# Patient Record
Sex: Male | Born: 1960 | Race: Black or African American | Hispanic: No | Marital: Married | Smoking: Former smoker
Health system: Southern US, Community
[De-identification: ages and names within clinical notes are randomized; demographics above are authoritative.]

## PROBLEM LIST (undated history)

## (undated) DIAGNOSIS — E119 Type 2 diabetes mellitus without complications: Secondary | ICD-10-CM

## (undated) DIAGNOSIS — N186 End stage renal disease: Secondary | ICD-10-CM

## (undated) DIAGNOSIS — I10 Essential (primary) hypertension: Secondary | ICD-10-CM

## (undated) DIAGNOSIS — I739 Peripheral vascular disease, unspecified: Secondary | ICD-10-CM

## (undated) DIAGNOSIS — E785 Hyperlipidemia, unspecified: Secondary | ICD-10-CM

## (undated) DIAGNOSIS — M869 Osteomyelitis, unspecified: Secondary | ICD-10-CM

## (undated) DIAGNOSIS — G629 Polyneuropathy, unspecified: Secondary | ICD-10-CM

## (undated) DIAGNOSIS — D649 Anemia, unspecified: Secondary | ICD-10-CM

## (undated) DIAGNOSIS — W3400XA Accidental discharge from unspecified firearms or gun, initial encounter: Secondary | ICD-10-CM

## (undated) DIAGNOSIS — K219 Gastro-esophageal reflux disease without esophagitis: Secondary | ICD-10-CM

## (undated) DIAGNOSIS — E1140 Type 2 diabetes mellitus with diabetic neuropathy, unspecified: Secondary | ICD-10-CM

## (undated) DIAGNOSIS — Z992 Dependence on renal dialysis: Secondary | ICD-10-CM

## (undated) DIAGNOSIS — IMO0001 Reserved for inherently not codable concepts without codable children: Secondary | ICD-10-CM

## (undated) DIAGNOSIS — Z794 Long term (current) use of insulin: Secondary | ICD-10-CM

## (undated) DIAGNOSIS — N184 Chronic kidney disease, stage 4 (severe): Secondary | ICD-10-CM

## (undated) DIAGNOSIS — K759 Inflammatory liver disease, unspecified: Secondary | ICD-10-CM

## (undated) DIAGNOSIS — Z972 Presence of dental prosthetic device (complete) (partial): Secondary | ICD-10-CM

## (undated) HISTORY — PX: EYE SURGERY: SHX253

## (undated) HISTORY — PX: FOOT OSTEOTOMY: SHX957

## (undated) HISTORY — PX: COLONOSCOPY: SHX174

## (undated) HISTORY — PX: AMPUTATION FINGER / THUMB: SUR24

## (undated) HISTORY — PX: BELOW KNEE LEG AMPUTATION: SUR23

---
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
## 2011-10-29 ENCOUNTER — Encounter: Payer: Self-pay | Admitting: *Deleted

## 2011-10-29 ENCOUNTER — Emergency Department (HOSPITAL_COMMUNITY): Payer: Self-pay

## 2011-10-29 ENCOUNTER — Emergency Department (HOSPITAL_COMMUNITY)
Admission: EM | Admit: 2011-10-29 | Discharge: 2011-10-29 | Disposition: A | Discharge status: Home / Self Care | Payer: Self-pay | Attending: Emergency Medicine | Admitting: Emergency Medicine

## 2011-10-29 DIAGNOSIS — Z794 Long term (current) use of insulin: Secondary | ICD-10-CM | POA: Insufficient documentation

## 2011-10-29 DIAGNOSIS — J029 Acute pharyngitis, unspecified: Secondary | ICD-10-CM | POA: Insufficient documentation

## 2011-10-29 DIAGNOSIS — E119 Type 2 diabetes mellitus without complications: Secondary | ICD-10-CM | POA: Insufficient documentation

## 2011-10-29 DIAGNOSIS — K219 Gastro-esophageal reflux disease without esophagitis: Secondary | ICD-10-CM | POA: Insufficient documentation

## 2011-10-29 DIAGNOSIS — J3489 Other specified disorders of nose and nasal sinuses: Secondary | ICD-10-CM | POA: Insufficient documentation

## 2011-10-29 DIAGNOSIS — M545 Low back pain, unspecified: Secondary | ICD-10-CM | POA: Insufficient documentation

## 2011-10-29 DIAGNOSIS — J069 Acute upper respiratory infection, unspecified: Secondary | ICD-10-CM | POA: Insufficient documentation

## 2011-10-29 DIAGNOSIS — J329 Chronic sinusitis, unspecified: Secondary | ICD-10-CM | POA: Insufficient documentation

## 2011-10-29 DIAGNOSIS — I10 Essential (primary) hypertension: Secondary | ICD-10-CM | POA: Insufficient documentation

## 2011-10-29 DIAGNOSIS — J40 Bronchitis, not specified as acute or chronic: Secondary | ICD-10-CM | POA: Insufficient documentation

## 2011-10-29 DIAGNOSIS — R197 Diarrhea, unspecified: Secondary | ICD-10-CM | POA: Insufficient documentation

## 2011-10-29 DIAGNOSIS — R059 Cough, unspecified: Secondary | ICD-10-CM | POA: Insufficient documentation

## 2011-10-29 DIAGNOSIS — R05 Cough: Secondary | ICD-10-CM | POA: Insufficient documentation

## 2011-10-29 HISTORY — DX: Essential (primary) hypertension: I10

## 2011-10-29 HISTORY — DX: Gastro-esophageal reflux disease without esophagitis: K21.9

## 2011-10-29 MED ORDER — HYDROCOD POLST-CHLORPHEN POLST 10-8 MG/5ML PO LQCR: 5.0000 mL | Freq: Two times a day (BID) | ORAL | Status: DC | PRN

## 2011-10-29 MED ORDER — ALBUTEROL SULFATE HFA 108 (90 BASE) MCG/ACT IN AERS
2.0000 | INHALATION_SPRAY | RESPIRATORY_TRACT | Status: DC
  Administered 2011-10-29: 2 via RESPIRATORY_TRACT
  Filled 2011-10-29: qty 6.7

## 2011-10-29 MED ORDER — PSEUDOEPHEDRINE HCL 30 MG PO TABS: 30.0000 mg | ORAL_TABLET | ORAL | Status: AC | PRN

## 2011-10-29 MED ORDER — AEROCHAMBER PLUS W/MASK MISC
Status: AC
  Administered 2011-10-29: 09:00:00
  Filled 2011-10-29: qty 1

## 2011-10-29 MED ORDER — HYDROCOD POLST-CHLORPHEN POLST 10-8 MG/5ML PO LQCR
5.0000 mL | Freq: Once | ORAL | Status: AC
  Administered 2011-10-29: 5 mL via ORAL
  Filled 2011-10-29: qty 5

## 2011-10-29 NOTE — ED Provider Notes (Signed)
History     CSN: [id number] Arrival date & time: 10/29/2011  8:28 AM   First MD Initiated Contact with Patient 10/29/11 0848      Chief Complaint  Patient presents with  . Nasal Congestion  . URI  . Back Pain  . Diarrhea    (Consider location/radiation/quality/duration/timing/severity/associated sxs/prior treatment) Patient is a 50 y.o. male presenting with URI. The history is provided by the patient.  URI The primary symptoms include sore throat and cough. Primary symptoms do not include fever, ear pain, swollen glands, wheezing or abdominal pain. The current episode started 2 days ago. This is a new problem. The problem has been gradually worsening.  The sore throat began 2 days ago. The sore throat has been unchanged since its onset. The sore throat is mild in intensity. The sore throat is not accompanied by trouble swallowing, drooling or hoarse voice.  The cough began 2 days ago. The cough is productive. The sputum is clear.  Symptoms associated with the illness include sinus pressure, congestion and rhinorrhea. The illness is not associated with chills.  Pt states prior to onset of URI, he had some lower back pain, abdominal pain, diarrhea. States all those symptoms now resolved. Pt denies fever, chills, chest pain. Not taking any medications for this.   Past Medical History  Diagnosis Date  . GERD (gastroesophageal reflux disease)   . HTN (hypertension)   . Diabetes mellitus     History reviewed. No pertinent past surgical history.  History reviewed. No pertinent family history.  History  Substance Use Topics  . Smoking status: Unknown If Ever Smoked  . Smokeless tobacco: Not on file  . Alcohol Use: No      Review of Systems  Constitutional: Negative for fever, chills and diaphoresis.  HENT: Positive for congestion, sore throat, rhinorrhea and sinus pressure. Negative for ear pain, hoarse voice, drooling and trouble swallowing.   Eyes: Negative.   Respiratory:  Positive for cough. Negative for chest tightness and wheezing.   Cardiovascular: Negative for chest pain and leg swelling.  Gastrointestinal: Negative for abdominal pain.  Genitourinary: Negative.   Musculoskeletal: Negative.   Skin: Negative.   Neurological: Negative for dizziness, weakness, light-headedness and numbness.  Psychiatric/Behavioral: Negative.     Allergies  Review of patient's allergies indicates no known allergies.  Home Medications   Current Outpatient Rx  Name Route Sig Dispense Refill  . INSULIN ISOPHANE & REGULAR (70-30) 100 UNIT/ML Victor SUSP Subcutaneous Inject 15-20 Units into the skin 2 (two) times daily. 20 units in the morning, 15 units at night     . LISINOPRIL PO Oral Take 1 tablet by mouth daily.      Marland Kitchen METFORMIN HCL PO Oral Take 1 tablet by mouth 2 (two) times daily.        BP 185/90  Pulse 80  Temp(Src) 97.8 F (36.6 C) (Oral)  Resp 18  SpO2 100%  Physical Exam  Constitutional: He is oriented to person, place, and time. He appears well-developed and well-nourished. No distress.  Eyes: Conjunctivae are normal.  Neck: Neck supple.  Cardiovascular: Normal rate, regular rhythm and normal heart sounds.   Pulmonary/Chest: Effort normal. No respiratory distress. He has wheezes. He has no rales. He exhibits no tenderness.  Abdominal: Soft. Bowel sounds are normal. There is no tenderness.  Musculoskeletal: Normal range of motion. He exhibits no edema.  Neurological: He is alert and oriented to person, place, and time.  Skin: Skin is warm and dry. No erythema.  Psychiatric: He has a normal mood and affect.    ED Course  Procedures (including critical care time)  No results found for this or any previous visit. Dg Chest 2 View  10/29/2011  *RADIOLOGY REPORT*  Clinical Data: CP, sinuses running, sometimes SOB, cough - prod in mornings and night, x 1 wk  CHEST - 2 VIEW  Comparison: None.  Findings: Lungs clear.  Heart size and pulmonary vascularity  normal.  No effusion.  Visualized bones unremarkable.  IMPRESSION: No acute disease  Original Report Authenticated By: Trecia Rogers, M.D.    X-ray negative. Pt has mild wheezes on exam. Oxygen sat 100%. Suspect viral bronchitis with nasal congestion. Given albuterol inhaler. Advised strongly to stop smoking. Will d/c home with prednisone cough meds and follow up. Pt denies chest pain, SOB. Pt is hypertensive, otherwise normal vs.    [place], [place] 10/29/11 1645

## 2011-10-29 NOTE — ED Notes (Signed)
Family at bedside. 

## 2011-10-29 NOTE — ED Notes (Signed)
Pt. reports bilateral lower back pain and, abdominal pain, and diarrhea that started 1.5 weeks ago. Pt. Presents today with "nasal and chest congestion."  Pt. Denies n/v, SOB, and fever.  Pt. Also denies any sick contacts.

## 2011-10-31 NOTE — ED Provider Notes (Signed)
Medical screening examination/treatment/procedure(s) were performed by non-physician practitioner and as supervising physician I was immediately available for consultation/collaboration. Rolland Porter, MD, Abram Sander   Janice Norrie, MD 10/31/11 [phone]

## 2012-03-29 ENCOUNTER — Encounter (HOSPITAL_BASED_OUTPATIENT_CLINIC_OR_DEPARTMENT_OTHER): Payer: BC Managed Care – PPO | Attending: General Surgery

## 2012-03-29 DIAGNOSIS — S98139A Complete traumatic amputation of one unspecified lesser toe, initial encounter: Secondary | ICD-10-CM | POA: Insufficient documentation

## 2012-03-29 DIAGNOSIS — Z79899 Other long term (current) drug therapy: Secondary | ICD-10-CM | POA: Insufficient documentation

## 2012-03-29 DIAGNOSIS — E119 Type 2 diabetes mellitus without complications: Secondary | ICD-10-CM | POA: Insufficient documentation

## 2012-03-29 DIAGNOSIS — L97509 Non-pressure chronic ulcer of other part of unspecified foot with unspecified severity: Secondary | ICD-10-CM | POA: Insufficient documentation

## 2012-03-29 DIAGNOSIS — I10 Essential (primary) hypertension: Secondary | ICD-10-CM | POA: Insufficient documentation

## 2012-03-29 DIAGNOSIS — Z794 Long term (current) use of insulin: Secondary | ICD-10-CM | POA: Insufficient documentation

## 2012-03-29 NOTE — Progress Notes (Signed)
[facility]  NAMEHERSON, [name]              ACCOUNT NO.:  [id number]  MEDICAL RECORD NO.:  [id number]      DATE OF BIRTH:  1961/10/24  PHYSICIAN:  Judene Companion, M.D.      VISIT DATE:  03/29/2012                                  OFFICE VISIT   [name] is a very nice gentleman who is 51 years of age.  He comes here with an ulcer on his left fourth toe.  It is about a centimeter in diameter, and he has a blood pressure of 172/85, respirations 16, pulse 80, temperature 98.6.  He is a fairly healthy gentleman who takes good care of his diabetes.  He tells me the last time he had an A1c it was 6.5.  He noticed this about 3 or 4 weeks ago.  The only medicine he takes is NovoLog, and he takes lisinopril for mild hypertension.  While he was here, he told me that he had had his fourth toe on this foot already amputated for diabetic foot ulcer with complications, but that healed well, and he is had no further trouble since, and that was in 2009.  He works every day at [employer] job and while he was here on examination, I could feel excellent pulses, and his foot was nice and warm.  I debrided the callus from the ulcer, which is about a centimeter.  We got a culture and got an x-ray to make sure there was not any osteo, and we put silver collagen in there, and we will make plans for a total contact cast next week, and because it was a new open wound, I empirically started him on doxycycline.     Judene Companion, M.D.     PP/MEDQ  D:  03/29/2012  T:  03/29/2012  Job:  [id number]

## 2012-04-05 ENCOUNTER — Encounter (HOSPITAL_BASED_OUTPATIENT_CLINIC_OR_DEPARTMENT_OTHER): Payer: BC Managed Care – PPO | Attending: General Surgery

## 2012-04-05 DIAGNOSIS — Z794 Long term (current) use of insulin: Secondary | ICD-10-CM | POA: Insufficient documentation

## 2012-04-05 DIAGNOSIS — I10 Essential (primary) hypertension: Secondary | ICD-10-CM | POA: Insufficient documentation

## 2012-04-05 DIAGNOSIS — L97509 Non-pressure chronic ulcer of other part of unspecified foot with unspecified severity: Secondary | ICD-10-CM | POA: Insufficient documentation

## 2012-04-05 DIAGNOSIS — Z79899 Other long term (current) drug therapy: Secondary | ICD-10-CM | POA: Insufficient documentation

## 2012-04-05 DIAGNOSIS — E1169 Type 2 diabetes mellitus with other specified complication: Secondary | ICD-10-CM | POA: Insufficient documentation

## 2012-04-12 ENCOUNTER — Ambulatory Visit (HOSPITAL_COMMUNITY)
Admission: RE | Admit: 2012-04-12 | Discharge: 2012-04-12 | Disposition: A | Discharge status: Home / Self Care | Payer: BC Managed Care – PPO | Source: Ambulatory Visit | Attending: General Surgery | Admitting: General Surgery

## 2012-04-12 ENCOUNTER — Other Ambulatory Visit (HOSPITAL_BASED_OUTPATIENT_CLINIC_OR_DEPARTMENT_OTHER): Payer: Self-pay | Admitting: General Surgery

## 2012-04-12 DIAGNOSIS — Z01818 Encounter for other preprocedural examination: Secondary | ICD-10-CM | POA: Insufficient documentation

## 2012-04-12 LAB — GLUCOSE, CAPILLARY: Glucose-Capillary: 194 mg/dL — ABNORMAL HIGH (ref 70–99)

## 2012-04-12 NOTE — Progress Notes (Signed)
[facility]  NAME:  KAZIMIERZ, RODI              ACCOUNT NO.:  [id number]  MEDICAL RECORD NO.:  [id number]      DATE OF BIRTH:  [birth year]  PHYSICIAN:  Judene Companion, M.D.           VISIT DATE:                                  OFFICE VISIT   [name] is a 51 year old gentleman with longstanding diabetes and he has got an ulcer on his plantar aspect of his left foot that is about a centimeter in diameter, and had quite a bit of drainage, I suspect osteomyelitis.  We are ordering an x-ray to rule out an osteo.  We have already cultured out Staph aureus and placed him on doxycycline.  This is a Wagner 3 ulcer and it goes down very close to the bone, and we are planning eventually on a total contact cast if we clear up the infection satisfactorily.  In the meantime, we will keep him on the doxycycline and I will make plans on applying for hyperbaric oxygen as I think this would help him considerably.  We also planned to apply for a Dermagraft as I think it is clean enough, once the infection is under control, it has got nice granulation tissue.  So, right now our goals are Dermagraft, later a total contact cast and hyperbaric oxygen treatments for the Wagner 3 ulcer on the plantar aspect of his left foot.     Judene Companion, M.D.     PP/MEDQ  D:  04/12/2012  T:  04/12/2012  Job:  [id number]

## 2012-04-22 LAB — GLUCOSE, CAPILLARY: Glucose-Capillary: 207 mg/dL — ABNORMAL HIGH (ref 70–99)

## 2012-04-26 ENCOUNTER — Encounter (HOSPITAL_BASED_OUTPATIENT_CLINIC_OR_DEPARTMENT_OTHER): Payer: BC Managed Care – PPO

## 2012-04-30 NOTE — Progress Notes (Signed)
[facility]  NAME:  [name], [name]                   ACCOUNT NO.:  MEDICAL RECORD NO.:  [id number]      DATE OF BIRTH:  11-13-1961  PHYSICIAN:  Judene Companion, M.D.           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  He has controlled his diabetes very well with NovoLog.  He also takes metformin and he is on lisinopril and hydrochlorothiazide for mild hypertension.  He has palpable pulses, but he has had a long history of these diabetic ulcers on the plantar aspect of his feet, and at the present time, they are graded as Wagner 3.  X-rays do not show any osteo yet.  We are treating him with collagen on both sides and also a total contact cast on the left side, which is the worst ulcer.  I think he would benefit as these are Wagner 3s, and we have had a great deal of problems with him not being able to heal up despite all of our efforts of antibiotics at times, multiple debridements, off-loading him, special shoes.  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  In the meantime, until we get permission for hyperbaric oxygen, we will continue to treat him with total contact cast and collagen.     Judene Companion, M.D.     PP/MEDQ  D:  04/26/2012  T:  04/26/2012  Job:  [id number]

## 2012-05-06 ENCOUNTER — Encounter (HOSPITAL_BASED_OUTPATIENT_CLINIC_OR_DEPARTMENT_OTHER): Payer: BC Managed Care – PPO | Attending: General Surgery

## 2012-05-06 DIAGNOSIS — Z794 Long term (current) use of insulin: Secondary | ICD-10-CM | POA: Insufficient documentation

## 2012-05-06 DIAGNOSIS — E1169 Type 2 diabetes mellitus with other specified complication: Secondary | ICD-10-CM | POA: Insufficient documentation

## 2012-05-06 DIAGNOSIS — L97509 Non-pressure chronic ulcer of other part of unspecified foot with unspecified severity: Secondary | ICD-10-CM | POA: Insufficient documentation

## 2012-06-03 ENCOUNTER — Encounter (HOSPITAL_BASED_OUTPATIENT_CLINIC_OR_DEPARTMENT_OTHER): Payer: BC Managed Care – PPO

## 2012-06-07 ENCOUNTER — Encounter (HOSPITAL_BASED_OUTPATIENT_CLINIC_OR_DEPARTMENT_OTHER): Payer: BC Managed Care – PPO | Attending: General Surgery

## 2012-06-07 DIAGNOSIS — E1169 Type 2 diabetes mellitus with other specified complication: Secondary | ICD-10-CM | POA: Insufficient documentation

## 2012-06-07 DIAGNOSIS — Z794 Long term (current) use of insulin: Secondary | ICD-10-CM | POA: Insufficient documentation

## 2012-06-07 DIAGNOSIS — L97509 Non-pressure chronic ulcer of other part of unspecified foot with unspecified severity: Secondary | ICD-10-CM | POA: Insufficient documentation

## 2012-06-28 LAB — GLUCOSE, CAPILLARY: Glucose-Capillary: 173 mg/dL — ABNORMAL HIGH (ref 70–99)

## 2012-07-05 ENCOUNTER — Encounter (HOSPITAL_BASED_OUTPATIENT_CLINIC_OR_DEPARTMENT_OTHER): Payer: BC Managed Care – PPO | Attending: General Surgery

## 2012-07-05 DIAGNOSIS — E1169 Type 2 diabetes mellitus with other specified complication: Secondary | ICD-10-CM | POA: Insufficient documentation

## 2012-07-05 DIAGNOSIS — Z794 Long term (current) use of insulin: Secondary | ICD-10-CM | POA: Insufficient documentation

## 2012-07-05 DIAGNOSIS — I10 Essential (primary) hypertension: Secondary | ICD-10-CM | POA: Insufficient documentation

## 2012-07-05 DIAGNOSIS — L97509 Non-pressure chronic ulcer of other part of unspecified foot with unspecified severity: Secondary | ICD-10-CM | POA: Insufficient documentation

## 2012-07-05 DIAGNOSIS — Z79899 Other long term (current) drug therapy: Secondary | ICD-10-CM | POA: Insufficient documentation

## 2012-08-09 ENCOUNTER — Encounter (HOSPITAL_BASED_OUTPATIENT_CLINIC_OR_DEPARTMENT_OTHER): Payer: BC Managed Care – PPO | Attending: General Surgery

## 2012-08-09 DIAGNOSIS — E1169 Type 2 diabetes mellitus with other specified complication: Secondary | ICD-10-CM | POA: Insufficient documentation

## 2012-08-09 DIAGNOSIS — L97509 Non-pressure chronic ulcer of other part of unspecified foot with unspecified severity: Secondary | ICD-10-CM | POA: Insufficient documentation

## 2012-09-13 ENCOUNTER — Encounter (HOSPITAL_BASED_OUTPATIENT_CLINIC_OR_DEPARTMENT_OTHER): Payer: BC Managed Care – PPO | Attending: General Surgery

## 2012-09-13 DIAGNOSIS — E1169 Type 2 diabetes mellitus with other specified complication: Secondary | ICD-10-CM | POA: Insufficient documentation

## 2012-09-13 DIAGNOSIS — L97509 Non-pressure chronic ulcer of other part of unspecified foot with unspecified severity: Secondary | ICD-10-CM | POA: Insufficient documentation

## 2012-10-04 ENCOUNTER — Encounter (HOSPITAL_BASED_OUTPATIENT_CLINIC_OR_DEPARTMENT_OTHER): Payer: BC Managed Care – PPO | Attending: General Surgery

## 2012-10-04 DIAGNOSIS — L84 Corns and callosities: Secondary | ICD-10-CM | POA: Insufficient documentation

## 2012-10-04 DIAGNOSIS — L97509 Non-pressure chronic ulcer of other part of unspecified foot with unspecified severity: Secondary | ICD-10-CM | POA: Insufficient documentation

## 2012-10-04 DIAGNOSIS — E1169 Type 2 diabetes mellitus with other specified complication: Secondary | ICD-10-CM | POA: Insufficient documentation

## 2012-10-11 ENCOUNTER — Encounter (HOSPITAL_BASED_OUTPATIENT_CLINIC_OR_DEPARTMENT_OTHER): Payer: BC Managed Care – PPO

## 2012-11-12 ENCOUNTER — Ambulatory Visit (HOSPITAL_COMMUNITY)
Admission: RE | Admit: 2012-11-12 | Discharge: 2012-11-12 | Disposition: A | Discharge status: Home / Self Care | Payer: BC Managed Care – PPO | Source: Ambulatory Visit | Attending: General Surgery | Admitting: General Surgery

## 2012-11-12 ENCOUNTER — Other Ambulatory Visit (HOSPITAL_BASED_OUTPATIENT_CLINIC_OR_DEPARTMENT_OTHER): Payer: Self-pay | Admitting: General Surgery

## 2012-11-12 ENCOUNTER — Encounter (HOSPITAL_BASED_OUTPATIENT_CLINIC_OR_DEPARTMENT_OTHER): Payer: BC Managed Care – PPO | Attending: General Surgery

## 2012-11-12 DIAGNOSIS — E1169 Type 2 diabetes mellitus with other specified complication: Secondary | ICD-10-CM | POA: Insufficient documentation

## 2012-11-12 DIAGNOSIS — M948X9 Other specified disorders of cartilage, unspecified sites: Secondary | ICD-10-CM | POA: Insufficient documentation

## 2012-11-12 DIAGNOSIS — M869 Osteomyelitis, unspecified: Secondary | ICD-10-CM

## 2012-11-12 DIAGNOSIS — L84 Corns and callosities: Secondary | ICD-10-CM | POA: Insufficient documentation

## 2012-11-12 DIAGNOSIS — L97509 Non-pressure chronic ulcer of other part of unspecified foot with unspecified severity: Secondary | ICD-10-CM | POA: Insufficient documentation

## 2012-11-12 DIAGNOSIS — M79609 Pain in unspecified limb: Secondary | ICD-10-CM | POA: Insufficient documentation

## 2012-11-12 DIAGNOSIS — M898X9 Other specified disorders of bone, unspecified site: Secondary | ICD-10-CM | POA: Insufficient documentation

## 2012-12-11 ENCOUNTER — Encounter (HOSPITAL_BASED_OUTPATIENT_CLINIC_OR_DEPARTMENT_OTHER): Payer: BC Managed Care – PPO | Attending: General Surgery

## 2012-12-11 DIAGNOSIS — L97509 Non-pressure chronic ulcer of other part of unspecified foot with unspecified severity: Secondary | ICD-10-CM | POA: Insufficient documentation

## 2012-12-11 DIAGNOSIS — L84 Corns and callosities: Secondary | ICD-10-CM | POA: Insufficient documentation

## 2012-12-11 DIAGNOSIS — E1169 Type 2 diabetes mellitus with other specified complication: Secondary | ICD-10-CM | POA: Insufficient documentation

## 2012-12-17 ENCOUNTER — Encounter (HOSPITAL_BASED_OUTPATIENT_CLINIC_OR_DEPARTMENT_OTHER): Payer: BC Managed Care – PPO

## 2012-12-25 ENCOUNTER — Encounter (INDEPENDENT_AMBULATORY_CARE_PROVIDER_SITE_OTHER): Payer: Self-pay | Admitting: Ophthalmology

## 2012-12-30 ENCOUNTER — Encounter (INDEPENDENT_AMBULATORY_CARE_PROVIDER_SITE_OTHER): Payer: Self-pay | Admitting: Ophthalmology

## 2013-01-01 ENCOUNTER — Encounter (INDEPENDENT_AMBULATORY_CARE_PROVIDER_SITE_OTHER): Payer: Self-pay | Admitting: Ophthalmology

## 2013-01-08 ENCOUNTER — Encounter (HOSPITAL_BASED_OUTPATIENT_CLINIC_OR_DEPARTMENT_OTHER): Payer: BC Managed Care – PPO | Attending: General Surgery

## 2013-01-08 DIAGNOSIS — E1169 Type 2 diabetes mellitus with other specified complication: Secondary | ICD-10-CM | POA: Insufficient documentation

## 2013-01-08 DIAGNOSIS — L97509 Non-pressure chronic ulcer of other part of unspecified foot with unspecified severity: Secondary | ICD-10-CM | POA: Insufficient documentation

## 2013-01-08 DIAGNOSIS — L84 Corns and callosities: Secondary | ICD-10-CM | POA: Insufficient documentation

## 2013-01-23 ENCOUNTER — Encounter (INDEPENDENT_AMBULATORY_CARE_PROVIDER_SITE_OTHER): Payer: Self-pay | Admitting: Ophthalmology

## 2013-03-30 ENCOUNTER — Encounter (HOSPITAL_COMMUNITY): Payer: Self-pay | Admitting: Adult Health

## 2013-03-30 DIAGNOSIS — I10 Essential (primary) hypertension: Secondary | ICD-10-CM | POA: Insufficient documentation

## 2013-03-30 DIAGNOSIS — Z794 Long term (current) use of insulin: Secondary | ICD-10-CM | POA: Insufficient documentation

## 2013-03-30 DIAGNOSIS — Z8719 Personal history of other diseases of the digestive system: Secondary | ICD-10-CM | POA: Insufficient documentation

## 2013-03-30 DIAGNOSIS — E119 Type 2 diabetes mellitus without complications: Secondary | ICD-10-CM | POA: Insufficient documentation

## 2013-03-30 DIAGNOSIS — M7989 Other specified soft tissue disorders: Secondary | ICD-10-CM | POA: Insufficient documentation

## 2013-03-30 DIAGNOSIS — Z79899 Other long term (current) drug therapy: Secondary | ICD-10-CM | POA: Insufficient documentation

## 2013-03-30 NOTE — ED Notes (Signed)
Presents with right sided middle toe pain that he noticed today. Middle toe is swollen and draining purulent drainage. Pt denies pain, just c/o neuropathy pain.

## 2013-03-31 ENCOUNTER — Emergency Department (HOSPITAL_COMMUNITY)
Admission: EM | Admit: 2013-03-31 | Discharge: 2013-03-31 | Disposition: A | Discharge status: Home / Self Care | Payer: BC Managed Care – PPO | Attending: Emergency Medicine | Admitting: Emergency Medicine

## 2013-03-31 ENCOUNTER — Emergency Department (HOSPITAL_COMMUNITY): Payer: BC Managed Care – PPO

## 2013-03-31 DIAGNOSIS — M7989 Other specified soft tissue disorders: Secondary | ICD-10-CM

## 2013-03-31 LAB — POCT I-STAT, CHEM 8
BUN: 32 mg/dL — ABNORMAL HIGH (ref 6–23)
Calcium, Ion: 1.16 mmol/L (ref 1.12–1.23)
Creatinine, Ser: 2.4 mg/dL — ABNORMAL HIGH (ref 0.50–1.35)
Glucose, Bld: 228 mg/dL — ABNORMAL HIGH (ref 70–99)
Hemoglobin: 11.2 g/dL — ABNORMAL LOW (ref 13.0–17.0)
Sodium: 141 mEq/L (ref 135–145)
TCO2: 22 mmol/L (ref 0–100)

## 2013-03-31 LAB — CBC WITH DIFFERENTIAL/PLATELET
Basophils Relative: 0 % (ref 0–1)
Eosinophils Absolute: 0.2 10*3/uL (ref 0.0–0.7)
Eosinophils Relative: 2 % (ref 0–5)
Hemoglobin: 10.1 g/dL — ABNORMAL LOW (ref 13.0–17.0)
Lymphs Abs: 1.9 10*3/uL (ref 0.7–4.0)
MCH: 23.4 pg — ABNORMAL LOW (ref 26.0–34.0)
MCHC: 33.2 g/dL (ref 30.0–36.0)
MCV: 70.4 fL — ABNORMAL LOW (ref 78.0–100.0)
Monocytes Absolute: 1.1 10*3/uL — ABNORMAL HIGH (ref 0.1–1.0)
Neutro Abs: 5.8 10*3/uL (ref 1.7–7.7)
Neutrophils Relative %: 65 % (ref 43–77)
RBC: 4.32 MIL/uL (ref 4.22–5.81)

## 2013-03-31 MED ORDER — AMOXICILLIN-POT CLAVULANATE 875-125 MG PO TABS: 1.0000 | ORAL_TABLET | Freq: Two times a day (BID) | ORAL | Status: DC

## 2013-03-31 MED ORDER — AMOXICILLIN-POT CLAVULANATE 875-125 MG PO TABS
1.0000 | ORAL_TABLET | Freq: Once | ORAL | Status: AC
  Administered 2013-03-31: 1 via ORAL
  Filled 2013-03-31: qty 1

## 2013-03-31 NOTE — ED Provider Notes (Signed)
Medical screening examination/treatment/procedure(s) were performed by non-physician practitioner and as supervising physician I was immediately available for consultation/collaboration.  Rhunette Croft, M.D.  Swollen second digit of the right foot, diabetic foot, this is fluctuant, no purulent drainage.  Antibiotics and orthopedics referral appointment today. Patient is nontoxic, afebrile  Rhunette Croft, MD 03/31/13 [phone]

## 2013-03-31 NOTE — ED Provider Notes (Signed)
History     CSN: [id number]  Arrival date & time 03/30/13  2313   First MD Initiated Contact with Patient 03/31/13 [phone]      Chief Complaint  Patient presents with  . 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  Noticed today, when he was changing his socks, that his right second toe is grossly swollen, and there is some discharge from it.  He has amputation to his middle toe is.  He does not check his blood sugars because he does not have a meter.  He saw his doctor approximately 2 weeks, ago.  No medications were changed, unclear whether his feet were examined at that time  Patient is a 52 y.o. male presenting with toe pain. The history is provided by the patient.  Toe Pain This is a new problem. The current episode started today. The problem occurs constantly. The problem has been unchanged. Associated symptoms include joint swelling. Pertinent negatives include no chills or fever.    Past Medical History  Diagnosis Date  . GERD (gastroesophageal reflux disease)   . HTN (hypertension)   . Diabetes mellitus     History reviewed. No pertinent past surgical history.  History reviewed. No pertinent family history.  History  Substance Use Topics  . Smoking status: Unknown If Ever Smoked  . Smokeless tobacco: Not on file  . Alcohol Use: No      Review of Systems  Constitutional: Negative for fever and chills.  Endocrine: Negative for polydipsia, polyphagia and polyuria.  Musculoskeletal: Positive for joint swelling. Negative for gait problem.  Skin: Positive for wound.  Neurological: Negative for dizziness and light-headedness.  All other systems reviewed and are negative.    Allergies  Review of patient's allergies indicates no known allergies.  Home Medications   Current Outpatient Rx  Name   Route  Sig  Dispense  Refill  . insulin NPH-insulin regular (NOVOLIN 70/30) (70-30) 100 UNIT/ML injection   Subcutaneous   Inject 20-25 Units into the skin See admin instructions. 25 units in the morning, 20 units at night         . lisinopril (PRINIVIL,ZESTRIL) 10 MG tablet   Oral   Take 10 mg by mouth daily.         Marland Kitchen amoxicillin-clavulanate (AUGMENTIN) 875-125 MG per tablet   Oral   Take 1 tablet by mouth 2 (two) times daily.   13 tablet   0     BP 169/96  Pulse 70  Temp(Src) 97.5 F (36.4 C) (Oral)  Resp 18  SpO2 100%  Physical Exam  Nursing note and vitals reviewed. Constitutional: He is oriented to person, place, and time. He appears well-developed and well-nourished.  Eyes: Pupils are equal, round, and reactive to light.  Neck: Normal range of motion.  Cardiovascular: Normal rate and regular rhythm.   Pulmonary/Chest: Effort normal.  Musculoskeletal: He exhibits edema. He exhibits no tenderness.  Right middle toe amputated.  Right second toe, massively swollen in the crease between the second toe, and amputation site.  There is a fissure in the skin, which is oozing a small amount of clear fluid.  There is no open ulcerated area on the second toe  Neurological: He is alert and oriented to person, place, and time.  Skin: Skin is warm.    ED Course  Procedures (including critical care time)  Labs Reviewed  CBC WITH DIFFERENTIAL -  Abnormal; Notable for the following:    Hemoglobin 10.1 (*)    HCT 30.4 (*)    MCV 70.4 (*)    MCH 23.4 (*)    Monocytes Absolute 1.1 (*)    All other components within normal limits  POCT I-STAT, CHEM 8 - Abnormal; Notable for the following:    BUN 32 (*)    Creatinine, Ser 2.40 (*)    Glucose, Bld 228 (*)    Hemoglobin 11.2 (*)    HCT 33.0 (*)    All other components within normal limits   Dg Foot Complete Right  03/31/2013  *RADIOLOGY REPORT*  Clinical Data: Swelling and infection of the second toe in a diabetic patient.   RIGHT FOOT COMPLETE - 3+ VIEW  Comparison: 11/12/2012  Findings: There is been previous amputation of the right third toe at the distal metatarsal level.  Hammertoe deformities are present. There is no evidence of any focal bone erosion, periosteal reaction, or sclerosis to suggest osteomyelitis.  Appearance is similar to previous studies.  Degenerative changes demonstrated in the interphalangeal, metatarsophalangeal, and intertarsal joints. No focal bone lesion or bone destruction.  No evidence of acute fracture or subluxation.  No radiopaque soft tissue foreign bodies. Vascular calcifications.  IMPRESSION: No radiographic evidence of osteomyelitis.  Postoperative changes with distal right third metatarsal resection.   Original Report Authenticated By: Lucienne Capers, M.D.      1. Toe swelling       MDM   X-ray views.  There is no gas, or osteomyelitis.  Evidence Spoke with Dr. Veverly Fells, who agrees to see the patient.  In the office, and started on Augmentin to his at today's message to patient and his family were in agreement       Garald Balding, NP 03/31/13 [place], NP 03/31/13 0345  Garald Balding, NP 03/31/13 [phone]

## 2013-03-31 NOTE — ED Notes (Signed)
Patient transported to X-ray 

## 2013-06-09 ENCOUNTER — Encounter (HOSPITAL_BASED_OUTPATIENT_CLINIC_OR_DEPARTMENT_OTHER): Payer: BC Managed Care – PPO | Attending: General Surgery

## 2013-06-09 DIAGNOSIS — Z794 Long term (current) use of insulin: Secondary | ICD-10-CM | POA: Insufficient documentation

## 2013-06-09 DIAGNOSIS — E1169 Type 2 diabetes mellitus with other specified complication: Secondary | ICD-10-CM | POA: Insufficient documentation

## 2013-06-09 DIAGNOSIS — L97509 Non-pressure chronic ulcer of other part of unspecified foot with unspecified severity: Secondary | ICD-10-CM | POA: Insufficient documentation

## 2013-06-09 DIAGNOSIS — S98139A Complete traumatic amputation of one unspecified lesser toe, initial encounter: Secondary | ICD-10-CM | POA: Insufficient documentation

## 2013-06-09 LAB — GLUCOSE, CAPILLARY

## 2013-06-10 NOTE — Progress Notes (Signed)
[facility]  NAME:  LIBERO, MATHERS              ACCOUNT NO.:  [id number]  MEDICAL RECORD NO.:  [id number]      DATE OF BIRTH:  1961/08/15  PHYSICIAN:  Judene Companion, M.D.           VISIT DATE:                                  OFFICE VISIT   He is a 52 year old male, who has diabetes and about a month ago had his right second toe removed because of osteomyelitis.  He has been a diabetic for 20 sum years.  His medicines include lisinopril and Novolin 70/30 and he comes to [place] today with an ulcer on volar aspect of his left great toe and the dorsal aspect of his 2nd toe.  I think these are Wagner 2 in depth and we are going to treat them with silver collagen and have him come back in a week.  We are off-loading him with foam and he already has diabetic shoes.  He has been a patient here many times in the past and we have cleared up diabetic ulcers on both of his feet. Today when he came here, he had a temperature of 98.6, his blood pressure was 155/90, respirations 18, pulse 80, he weighs 235 pounds. His blood glucose was 131, so we will be off-loading him and treating him with dressings.  He will come back in a week.  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     Judene Companion, M.D.     PP/MEDQ  D:  06/09/2013  T:  06/10/2013  Job:  [id number]

## 2013-06-25 ENCOUNTER — Other Ambulatory Visit (HOSPITAL_BASED_OUTPATIENT_CLINIC_OR_DEPARTMENT_OTHER): Payer: Self-pay | Admitting: General Surgery

## 2013-06-25 ENCOUNTER — Ambulatory Visit (HOSPITAL_COMMUNITY)
Admission: RE | Admit: 2013-06-25 | Discharge: 2013-06-25 | Disposition: A | Discharge status: Home / Self Care | Payer: BC Managed Care – PPO | Source: Ambulatory Visit | Attending: General Surgery | Admitting: General Surgery

## 2013-06-25 DIAGNOSIS — X58XXXA Exposure to other specified factors, initial encounter: Secondary | ICD-10-CM | POA: Insufficient documentation

## 2013-06-25 DIAGNOSIS — M869 Osteomyelitis, unspecified: Secondary | ICD-10-CM

## 2013-06-25 DIAGNOSIS — E119 Type 2 diabetes mellitus without complications: Secondary | ICD-10-CM | POA: Insufficient documentation

## 2013-06-25 DIAGNOSIS — S91109A Unspecified open wound of unspecified toe(s) without damage to nail, initial encounter: Secondary | ICD-10-CM | POA: Insufficient documentation

## 2013-07-09 ENCOUNTER — Encounter (HOSPITAL_BASED_OUTPATIENT_CLINIC_OR_DEPARTMENT_OTHER): Payer: BC Managed Care – PPO | Attending: General Surgery

## 2013-07-09 DIAGNOSIS — E1169 Type 2 diabetes mellitus with other specified complication: Secondary | ICD-10-CM | POA: Insufficient documentation

## 2013-07-09 DIAGNOSIS — L97509 Non-pressure chronic ulcer of other part of unspecified foot with unspecified severity: Secondary | ICD-10-CM | POA: Insufficient documentation

## 2013-07-09 DIAGNOSIS — L84 Corns and callosities: Secondary | ICD-10-CM | POA: Insufficient documentation

## 2013-08-06 ENCOUNTER — Encounter (HOSPITAL_BASED_OUTPATIENT_CLINIC_OR_DEPARTMENT_OTHER): Payer: BC Managed Care – PPO | Attending: General Surgery

## 2013-08-06 DIAGNOSIS — M9080 Osteopathy in diseases classified elsewhere, unspecified site: Secondary | ICD-10-CM | POA: Insufficient documentation

## 2013-08-06 DIAGNOSIS — L97509 Non-pressure chronic ulcer of other part of unspecified foot with unspecified severity: Secondary | ICD-10-CM | POA: Insufficient documentation

## 2013-08-06 DIAGNOSIS — E1069 Type 1 diabetes mellitus with other specified complication: Secondary | ICD-10-CM | POA: Insufficient documentation

## 2013-08-06 DIAGNOSIS — M869 Osteomyelitis, unspecified: Secondary | ICD-10-CM | POA: Insufficient documentation

## 2013-08-06 DIAGNOSIS — Z794 Long term (current) use of insulin: Secondary | ICD-10-CM | POA: Insufficient documentation

## 2013-08-07 NOTE — Progress Notes (Signed)
[facility]  NAME:  [name], [name]              ACCOUNT NO.:  [id number]  MEDICAL RECORD NO.:  [id number]      DATE OF BIRTH:  1961-01-23  PHYSICIAN:  Judene Companion, M.D.           VISIT DATE:                                  OFFICE VISIT   [name] is a 52 year old male who has type 1 diabetes, and I have been treating him for an ulcer on the right great toe, that I am calling a Wagner 3.  Today, I had to debride circumferentially around the distal phalanx, all the dead skin, including the nail because of an infection under the epidermis.  The x-rays show an osteomyelitis in the tip of the distal phalanx, and I am putting him on doxycycline.  I also feel that I would like to start this man on hyperbaric oxygen in order to combat the infection and perhaps save at least the great toe and perhaps his forefoot.  This gentleman has a good posterior tibial pulse and good dorsalis pedis pulse.  His blood sugars have been normal and he is taking good care of his diabetes with Lantus and NovoLog insulin.  I am writing [facility] for permission to start a month of hyperbaric oxygen.  We have made arrangements from this man's company that he get off work for 2 hours Monday through Friday, so that he could complete the month duration of hyperbaric oxygen.  I think this would benefit this gentleman and perhaps save his foot with these treatments I described.     Judene Companion, M.D.     PP/MEDQ  D:  08/06/2013  T:  08/07/2013  Job:  [id number]

## 2013-08-20 ENCOUNTER — Ambulatory Visit (HOSPITAL_COMMUNITY)
Admission: RE | Admit: 2013-08-20 | Discharge: 2013-08-20 | Disposition: A | Discharge status: Home / Self Care | Payer: BC Managed Care – PPO | Source: Ambulatory Visit | Attending: General Surgery | Admitting: General Surgery

## 2013-08-20 ENCOUNTER — Other Ambulatory Visit (HOSPITAL_BASED_OUTPATIENT_CLINIC_OR_DEPARTMENT_OTHER): Payer: Self-pay | Admitting: General Surgery

## 2013-08-20 DIAGNOSIS — I10 Essential (primary) hypertension: Secondary | ICD-10-CM | POA: Insufficient documentation

## 2013-08-20 DIAGNOSIS — M8448XA Pathological fracture, other site, initial encounter for fracture: Secondary | ICD-10-CM | POA: Insufficient documentation

## 2013-08-20 DIAGNOSIS — T148XXA Other injury of unspecified body region, initial encounter: Secondary | ICD-10-CM

## 2013-08-20 DIAGNOSIS — S98139A Complete traumatic amputation of one unspecified lesser toe, initial encounter: Secondary | ICD-10-CM | POA: Insufficient documentation

## 2013-08-20 DIAGNOSIS — M869 Osteomyelitis, unspecified: Secondary | ICD-10-CM | POA: Insufficient documentation

## 2013-08-20 DIAGNOSIS — I70209 Unspecified atherosclerosis of native arteries of extremities, unspecified extremity: Secondary | ICD-10-CM | POA: Insufficient documentation

## 2013-09-03 ENCOUNTER — Encounter (HOSPITAL_BASED_OUTPATIENT_CLINIC_OR_DEPARTMENT_OTHER): Payer: BC Managed Care – PPO | Attending: General Surgery

## 2013-09-03 DIAGNOSIS — S98139A Complete traumatic amputation of one unspecified lesser toe, initial encounter: Secondary | ICD-10-CM | POA: Insufficient documentation

## 2013-09-03 DIAGNOSIS — M869 Osteomyelitis, unspecified: Secondary | ICD-10-CM | POA: Insufficient documentation

## 2013-09-03 DIAGNOSIS — L97509 Non-pressure chronic ulcer of other part of unspecified foot with unspecified severity: Secondary | ICD-10-CM | POA: Insufficient documentation

## 2013-09-03 DIAGNOSIS — M9080 Osteopathy in diseases classified elsewhere, unspecified site: Secondary | ICD-10-CM | POA: Insufficient documentation

## 2013-09-03 DIAGNOSIS — E1169 Type 2 diabetes mellitus with other specified complication: Secondary | ICD-10-CM | POA: Insufficient documentation

## 2013-09-04 NOTE — Progress Notes (Signed)
[facility]  NAME:  [name], [name]              ACCOUNT NO.:  [id number]  MEDICAL RECORD NO.:  [id number]      DATE OF BIRTH:  11-Sep-1961  PHYSICIAN:  Judene Companion, M.D.      VISIT DATE:  09/03/2013                                  OFFICE VISIT   [name] is a 52 year old diabetic man who we have been working on diabetic ulcers for some time.  His ulcers have mainly been on the plantar aspect of his foot, especially his right great toe.  He has already had osteomyelitis and had to have his second left toe amputated. He is taking good care of his diabetes with an A1c below 7.  The x-ray shows osteomyelitis of the distal phalanx of his right great toe.  The ulcer is about 2 cm in diameter and goes right down to the mediastinum. His vascular studies show that he has got excellent blood flow through the femoral, the popliteal, and he has a 3-vessel runoff on both legs. He would need to have this treated long term with antibiotics and I am working very hard to keep him debrided and I am dressing it with silver alginate and we have him on doxycycline 100 mg twice a day.  I think in his best interest to try to have limb salvage here.  I am requesting 40 treatments of hyperbaric oxygen therapy.  Thank you for your consideration.     Judene Companion, M.D.     PP/MEDQ  D:  09/03/2013  T:  09/04/2013  Job:  [id number]

## 2013-09-15 LAB — GLUCOSE, CAPILLARY: Glucose-Capillary: 97 mg/dL (ref 70–99)

## 2013-09-17 LAB — GLUCOSE, CAPILLARY
Glucose-Capillary: 124 mg/dL — ABNORMAL HIGH (ref 70–99)
Glucose-Capillary: 183 mg/dL — ABNORMAL HIGH (ref 70–99)
Glucose-Capillary: 118 mg/dL — ABNORMAL HIGH (ref 70–99)
Glucose-Capillary: 114 mg/dL — ABNORMAL HIGH (ref 70–99)

## 2013-09-18 LAB — GLUCOSE, CAPILLARY: Glucose-Capillary: 109 mg/dL — ABNORMAL HIGH (ref 70–99)

## 2013-09-22 LAB — GLUCOSE, CAPILLARY
Glucose-Capillary: 175 mg/dL — ABNORMAL HIGH (ref 70–99)
Glucose-Capillary: 144 mg/dL — ABNORMAL HIGH (ref 70–99)

## 2013-09-23 LAB — GLUCOSE, CAPILLARY: Glucose-Capillary: 164 mg/dL — ABNORMAL HIGH (ref 70–99)

## 2013-09-24 LAB — GLUCOSE, CAPILLARY
Glucose-Capillary: 247 mg/dL — ABNORMAL HIGH (ref 70–99)
Glucose-Capillary: 131 mg/dL — ABNORMAL HIGH (ref 70–99)

## 2013-09-25 LAB — GLUCOSE, CAPILLARY: Glucose-Capillary: 171 mg/dL — ABNORMAL HIGH (ref 70–99)

## 2013-09-26 LAB — GLUCOSE, CAPILLARY: Glucose-Capillary: 196 mg/dL — ABNORMAL HIGH (ref 70–99)

## 2013-09-30 LAB — GLUCOSE, CAPILLARY
Glucose-Capillary: 175 mg/dL — ABNORMAL HIGH (ref 70–99)
Glucose-Capillary: 145 mg/dL — ABNORMAL HIGH (ref 70–99)

## 2013-10-01 LAB — GLUCOSE, CAPILLARY: Glucose-Capillary: 125 mg/dL — ABNORMAL HIGH (ref 70–99)

## 2013-10-02 LAB — GLUCOSE, CAPILLARY
Glucose-Capillary: 120 mg/dL — ABNORMAL HIGH (ref 70–99)
Glucose-Capillary: 110 mg/dL — ABNORMAL HIGH (ref 70–99)

## 2013-10-06 ENCOUNTER — Encounter (HOSPITAL_BASED_OUTPATIENT_CLINIC_OR_DEPARTMENT_OTHER): Payer: BC Managed Care – PPO | Attending: General Surgery

## 2013-10-06 DIAGNOSIS — E1169 Type 2 diabetes mellitus with other specified complication: Secondary | ICD-10-CM | POA: Insufficient documentation

## 2013-10-06 DIAGNOSIS — L97509 Non-pressure chronic ulcer of other part of unspecified foot with unspecified severity: Secondary | ICD-10-CM | POA: Insufficient documentation

## 2013-10-08 LAB — GLUCOSE, CAPILLARY: Glucose-Capillary: 92 mg/dL (ref 70–99)

## 2013-10-09 LAB — GLUCOSE, CAPILLARY
Glucose-Capillary: 172 mg/dL — ABNORMAL HIGH (ref 70–99)
Glucose-Capillary: 131 mg/dL — ABNORMAL HIGH (ref 70–99)
Glucose-Capillary: 105 mg/dL — ABNORMAL HIGH (ref 70–99)

## 2013-10-15 LAB — GLUCOSE, CAPILLARY
Glucose-Capillary: 169 mg/dL — ABNORMAL HIGH (ref 70–99)
Glucose-Capillary: 80 mg/dL (ref 70–99)

## 2013-10-17 LAB — GLUCOSE, CAPILLARY
Glucose-Capillary: 167 mg/dL — ABNORMAL HIGH (ref 70–99)
Glucose-Capillary: 171 mg/dL — ABNORMAL HIGH (ref 70–99)

## 2013-10-20 LAB — GLUCOSE, CAPILLARY: Glucose-Capillary: 151 mg/dL — ABNORMAL HIGH (ref 70–99)

## 2013-10-21 LAB — GLUCOSE, CAPILLARY: Glucose-Capillary: 160 mg/dL — ABNORMAL HIGH (ref 70–99)

## 2013-10-22 LAB — GLUCOSE, CAPILLARY
Glucose-Capillary: 147 mg/dL — ABNORMAL HIGH (ref 70–99)
Glucose-Capillary: 103 mg/dL — ABNORMAL HIGH (ref 70–99)

## 2013-11-03 ENCOUNTER — Encounter (HOSPITAL_BASED_OUTPATIENT_CLINIC_OR_DEPARTMENT_OTHER): Payer: BC Managed Care – PPO | Attending: General Surgery

## 2013-11-03 DIAGNOSIS — L97509 Non-pressure chronic ulcer of other part of unspecified foot with unspecified severity: Secondary | ICD-10-CM | POA: Insufficient documentation

## 2013-11-03 DIAGNOSIS — M869 Osteomyelitis, unspecified: Secondary | ICD-10-CM | POA: Insufficient documentation

## 2013-11-03 DIAGNOSIS — E1069 Type 1 diabetes mellitus with other specified complication: Secondary | ICD-10-CM | POA: Insufficient documentation

## 2013-11-03 DIAGNOSIS — E1159 Type 2 diabetes mellitus with other circulatory complications: Secondary | ICD-10-CM | POA: Insufficient documentation

## 2013-11-03 DIAGNOSIS — I798 Other disorders of arteries, arterioles and capillaries in diseases classified elsewhere: Secondary | ICD-10-CM | POA: Insufficient documentation

## 2013-11-04 LAB — GLUCOSE, CAPILLARY
Glucose-Capillary: 157 mg/dL — ABNORMAL HIGH (ref 70–99)
Glucose-Capillary: 265 mg/dL — ABNORMAL HIGH (ref 70–99)
Glucose-Capillary: 131 mg/dL — ABNORMAL HIGH (ref 70–99)

## 2013-11-05 LAB — GLUCOSE, CAPILLARY: Glucose-Capillary: 165 mg/dL — ABNORMAL HIGH (ref 70–99)

## 2013-11-06 LAB — GLUCOSE, CAPILLARY: Glucose-Capillary: 249 mg/dL — ABNORMAL HIGH (ref 70–99)

## 2013-11-10 LAB — GLUCOSE, CAPILLARY: Glucose-Capillary: 86 mg/dL (ref 70–99)

## 2013-11-11 LAB — GLUCOSE, CAPILLARY
Glucose-Capillary: 237 mg/dL — ABNORMAL HIGH (ref 70–99)
Glucose-Capillary: 120 mg/dL — ABNORMAL HIGH (ref 70–99)

## 2013-11-12 LAB — GLUCOSE, CAPILLARY
Glucose-Capillary: 97 mg/dL (ref 70–99)
Glucose-Capillary: 132 mg/dL — ABNORMAL HIGH (ref 70–99)

## 2013-11-14 LAB — GLUCOSE, CAPILLARY: Glucose-Capillary: 173 mg/dL — ABNORMAL HIGH (ref 70–99)

## 2013-11-17 LAB — GLUCOSE, CAPILLARY
Glucose-Capillary: 139 mg/dL — ABNORMAL HIGH (ref 70–99)
Glucose-Capillary: 150 mg/dL — ABNORMAL HIGH (ref 70–99)

## 2013-11-18 LAB — GLUCOSE, CAPILLARY: Glucose-Capillary: 88 mg/dL (ref 70–99)

## 2013-11-19 LAB — GLUCOSE, CAPILLARY
Glucose-Capillary: 129 mg/dL — ABNORMAL HIGH (ref 70–99)
Glucose-Capillary: 120 mg/dL — ABNORMAL HIGH (ref 70–99)

## 2013-11-20 LAB — GLUCOSE, CAPILLARY: Glucose-Capillary: 144 mg/dL — ABNORMAL HIGH (ref 70–99)

## 2013-12-01 LAB — GLUCOSE, CAPILLARY: Glucose-Capillary: 130 mg/dL — ABNORMAL HIGH (ref 70–99)

## 2013-12-02 LAB — GLUCOSE, CAPILLARY: Glucose-Capillary: 130 mg/dL — ABNORMAL HIGH (ref 70–99)

## 2013-12-06 NOTE — Progress Notes (Signed)
[facility]  NAME:  [name], [name]              ACCOUNT NO.:  [id number]  MEDICAL RECORD NO.:  [id number]      DATE OF BIRTH:  Jan 07, 1961  PHYSICIAN:  Judene Companion, M.D.           VISIT DATE:                                  OFFICE VISIT   Dear Lessie Dings:  This is pertaining to my patient, [name].  [name] is a 53- year-old diabetic who has had great problems with peripheral vascular disease from his type 1 diabetes.  He has been taking care of his diabetes admirably, but nevertheless he has had great problems with diabetic ulcers and has ended up having 2 of his toes amputated because of ischemic changes.  He has [name] 3 diabetic ulcers on his left great toe and the dorsal aspect of his left second toe.  I would like very much to continue him on hyperbaric oxygen treatments which we have been doing for the past 3 weeks.  He has x-ray evidence of osteomyelitis, and he is on antibiotics, and I wished to continue hyperbaric treatments as I think they are helping him tremendously along with the antibiotics. His ulcers have gone down greatly in size, and I have not had followup x- rays, but I feel that when I do a followup x-ray or MRI, will show some signs of regression of the disease.  So I am applying to his new insurance companies to insure him for continuing his treatment of hyperbaric oxygen therapy because of the diabetic ulcers and osteomyelitis involving the toes of his left foot.     Judene Companion, M.D.     PP/MEDQ  D:  12/05/2013  T:  12/05/2013  Job:  [id number]

## 2013-12-10 ENCOUNTER — Encounter (HOSPITAL_BASED_OUTPATIENT_CLINIC_OR_DEPARTMENT_OTHER): Payer: Managed Care, Other (non HMO) | Attending: General Surgery

## 2013-12-10 DIAGNOSIS — M9080 Osteopathy in diseases classified elsewhere, unspecified site: Secondary | ICD-10-CM | POA: Insufficient documentation

## 2013-12-10 DIAGNOSIS — I96 Gangrene, not elsewhere classified: Secondary | ICD-10-CM | POA: Insufficient documentation

## 2013-12-10 DIAGNOSIS — I739 Peripheral vascular disease, unspecified: Secondary | ICD-10-CM | POA: Insufficient documentation

## 2013-12-10 DIAGNOSIS — E1159 Type 2 diabetes mellitus with other circulatory complications: Secondary | ICD-10-CM | POA: Insufficient documentation

## 2013-12-10 DIAGNOSIS — M869 Osteomyelitis, unspecified: Secondary | ICD-10-CM | POA: Insufficient documentation

## 2013-12-10 DIAGNOSIS — E1169 Type 2 diabetes mellitus with other specified complication: Secondary | ICD-10-CM | POA: Insufficient documentation

## 2013-12-10 DIAGNOSIS — L97509 Non-pressure chronic ulcer of other part of unspecified foot with unspecified severity: Secondary | ICD-10-CM | POA: Insufficient documentation

## 2013-12-16 LAB — GLUCOSE, CAPILLARY
GLUCOSE-CAPILLARY: 162 mg/dL — AB (ref 70–99)
GLUCOSE-CAPILLARY: 122 mg/dL — AB (ref 70–99)
Glucose-Capillary: 217 mg/dL — ABNORMAL HIGH (ref 70–99)
Glucose-Capillary: 164 mg/dL — ABNORMAL HIGH (ref 70–99)

## 2013-12-17 LAB — GLUCOSE, CAPILLARY
Glucose-Capillary: 203 mg/dL — ABNORMAL HIGH (ref 70–99)
Glucose-Capillary: 168 mg/dL — ABNORMAL HIGH (ref 70–99)

## 2013-12-18 ENCOUNTER — Inpatient Hospital Stay (HOSPITAL_COMMUNITY)
Admission: EM | Admit: 2013-12-18 | Discharge: 2013-12-23 | DRG: 617 | Disposition: A | Discharge status: Home Health | Payer: Managed Care, Other (non HMO) | Attending: Internal Medicine | Admitting: Internal Medicine

## 2013-12-18 ENCOUNTER — Encounter (HOSPITAL_COMMUNITY): Payer: Self-pay | Admitting: Emergency Medicine

## 2013-12-18 ENCOUNTER — Emergency Department (HOSPITAL_COMMUNITY): Payer: Managed Care, Other (non HMO)

## 2013-12-18 DIAGNOSIS — K219 Gastro-esophageal reflux disease without esophagitis: Secondary | ICD-10-CM | POA: Diagnosis present

## 2013-12-18 DIAGNOSIS — D72829 Elevated white blood cell count, unspecified: Secondary | ICD-10-CM | POA: Diagnosis present

## 2013-12-18 DIAGNOSIS — E1169 Type 2 diabetes mellitus with other specified complication: Principal | ICD-10-CM

## 2013-12-18 DIAGNOSIS — Z794 Long term (current) use of insulin: Secondary | ICD-10-CM

## 2013-12-18 DIAGNOSIS — I96 Gangrene, not elsewhere classified: Secondary | ICD-10-CM

## 2013-12-18 DIAGNOSIS — N039 Chronic nephritic syndrome with unspecified morphologic changes: Secondary | ICD-10-CM

## 2013-12-18 DIAGNOSIS — I129 Hypertensive chronic kidney disease with stage 1 through stage 4 chronic kidney disease, or unspecified chronic kidney disease: Secondary | ICD-10-CM | POA: Diagnosis present

## 2013-12-18 DIAGNOSIS — IMO0001 Reserved for inherently not codable concepts without codable children: Secondary | ICD-10-CM

## 2013-12-18 DIAGNOSIS — R739 Hyperglycemia, unspecified: Secondary | ICD-10-CM

## 2013-12-18 DIAGNOSIS — E119 Type 2 diabetes mellitus without complications: Secondary | ICD-10-CM

## 2013-12-18 DIAGNOSIS — M9080 Osteopathy in diseases classified elsewhere, unspecified site: Secondary | ICD-10-CM | POA: Diagnosis present

## 2013-12-18 DIAGNOSIS — N289 Disorder of kidney and ureter, unspecified: Secondary | ICD-10-CM

## 2013-12-18 DIAGNOSIS — N184 Chronic kidney disease, stage 4 (severe): Secondary | ICD-10-CM | POA: Diagnosis present

## 2013-12-18 DIAGNOSIS — D638 Anemia in other chronic diseases classified elsewhere: Secondary | ICD-10-CM | POA: Diagnosis present

## 2013-12-18 DIAGNOSIS — D631 Anemia in chronic kidney disease: Secondary | ICD-10-CM | POA: Diagnosis present

## 2013-12-18 DIAGNOSIS — E1159 Type 2 diabetes mellitus with other circulatory complications: Secondary | ICD-10-CM | POA: Diagnosis present

## 2013-12-18 DIAGNOSIS — E11628 Type 2 diabetes mellitus with other skin complications: Secondary | ICD-10-CM

## 2013-12-18 DIAGNOSIS — S98139A Complete traumatic amputation of one unspecified lesser toe, initial encounter: Secondary | ICD-10-CM

## 2013-12-18 DIAGNOSIS — M869 Osteomyelitis, unspecified: Secondary | ICD-10-CM | POA: Diagnosis present

## 2013-12-18 DIAGNOSIS — L089 Local infection of the skin and subcutaneous tissue, unspecified: Secondary | ICD-10-CM

## 2013-12-18 DIAGNOSIS — I10 Essential (primary) hypertension: Secondary | ICD-10-CM

## 2013-12-18 DIAGNOSIS — L97509 Non-pressure chronic ulcer of other part of unspecified foot with unspecified severity: Secondary | ICD-10-CM | POA: Diagnosis present

## 2013-12-18 LAB — COMPREHENSIVE METABOLIC PANEL
ALT: 16 U/L (ref 0–53)
AST: 14 U/L (ref 0–37)
Albumin: 2.4 g/dL — ABNORMAL LOW (ref 3.5–5.2)
Alkaline Phosphatase: 82 U/L (ref 39–117)
BUN: 45 mg/dL — ABNORMAL HIGH (ref 6–23)
CALCIUM: 8.3 mg/dL — AB (ref 8.4–10.5)
CO2: 21 mEq/L (ref 19–32)
Chloride: 105 mEq/L (ref 96–112)
Creatinine, Ser: 2.62 mg/dL — ABNORMAL HIGH (ref 0.50–1.35)
GFR calc non Af Amer: 26 mL/min — ABNORMAL LOW (ref >90)
GFR, EST AFRICAN AMERICAN: 30 mL/min — AB (ref >90)
Glucose, Bld: 227 mg/dL — ABNORMAL HIGH (ref 70–99)
Potassium: 4.3 mEq/L (ref 3.7–5.3)
SODIUM: 140 meq/L (ref 137–147)
TOTAL PROTEIN: 7.2 g/dL (ref 6.0–8.3)
Total Bilirubin: <0.2 mg/dL — ABNORMAL LOW (ref 0.3–1.2)

## 2013-12-18 LAB — BASIC METABOLIC PANEL
BUN: 43 mg/dL — ABNORMAL HIGH (ref 6–23)
CHLORIDE: 105 meq/L (ref 96–112)
CO2: 20 mEq/L (ref 19–32)
Calcium: 8.8 mg/dL (ref 8.4–10.5)
Creatinine, Ser: 2.68 mg/dL — ABNORMAL HIGH (ref 0.50–1.35)
GFR calc non Af Amer: 25 mL/min — ABNORMAL LOW (ref >90)
GFR, EST AFRICAN AMERICAN: 29 mL/min — AB (ref >90)
Glucose, Bld: 184 mg/dL — ABNORMAL HIGH (ref 70–99)
POTASSIUM: 4.8 meq/L (ref 3.7–5.3)
SODIUM: 141 meq/L (ref 137–147)

## 2013-12-18 LAB — CBC
HEMATOCRIT: 31.9 % — AB (ref 39.0–52.0)
Hemoglobin: 10.2 g/dL — ABNORMAL LOW (ref 13.0–17.0)
MCH: 23.5 pg — ABNORMAL LOW (ref 26.0–34.0)
MCHC: 32 g/dL (ref 30.0–36.0)
MCV: 73.5 fL — ABNORMAL LOW (ref 78.0–100.0)
PLATELETS: 246 10*3/uL (ref 150–400)
RBC: 4.34 MIL/uL (ref 4.22–5.81)
RDW: 13.6 % (ref 11.5–15.5)
WBC: 13 10*3/uL — ABNORMAL HIGH (ref 4.0–10.5)

## 2013-12-18 LAB — CBC WITH DIFFERENTIAL/PLATELET
Basophils Absolute: 0 10*3/uL (ref 0.0–0.1)
Basophils Relative: 0 % (ref 0–1)
Eosinophils Absolute: 0.3 10*3/uL (ref 0.0–0.7)
Eosinophils Relative: 2 % (ref 0–5)
HEMATOCRIT: 27.1 % — AB (ref 39.0–52.0)
Hemoglobin: 8.5 g/dL — ABNORMAL LOW (ref 13.0–17.0)
LYMPHS ABS: 1.6 10*3/uL (ref 0.7–4.0)
Lymphocytes Relative: 12 % (ref 12–46)
MCH: 23.1 pg — ABNORMAL LOW (ref 26.0–34.0)
MCHC: 31.4 g/dL (ref 30.0–36.0)
MCV: 73.6 fL — AB (ref 78.0–100.0)
Monocytes Absolute: 1.9 10*3/uL — ABNORMAL HIGH (ref 0.1–1.0)
Monocytes Relative: 14 % — ABNORMAL HIGH (ref 3–12)
Neutro Abs: 9.9 10*3/uL — ABNORMAL HIGH (ref 1.7–7.7)
Neutrophils Relative %: 72 % (ref 43–77)
PLATELETS: 269 10*3/uL (ref 150–400)
RBC: 3.68 MIL/uL — AB (ref 4.22–5.81)
RDW: 13.6 % (ref 11.5–15.5)
WBC: 13.7 10*3/uL — AB (ref 4.0–10.5)

## 2013-12-18 LAB — GLUCOSE, CAPILLARY: Glucose-Capillary: 209 mg/dL — ABNORMAL HIGH (ref 70–99)

## 2013-12-18 LAB — PROTIME-INR
INR: 1.09 (ref 0.00–1.49)
PROTHROMBIN TIME: 13.9 s (ref 11.6–15.2)

## 2013-12-18 LAB — APTT: aPTT: 39 seconds — ABNORMAL HIGH (ref 24–37)

## 2013-12-18 LAB — PHOSPHORUS: Phosphorus: 3.5 mg/dL (ref 2.3–4.6)

## 2013-12-18 LAB — MAGNESIUM: Magnesium: 1.6 mg/dL (ref 1.5–2.5)

## 2013-12-18 MED ORDER — ONDANSETRON HCL 4 MG/2ML IJ SOLN: 4.0000 mg | Freq: Four times a day (QID) | INTRAMUSCULAR | Status: DC | PRN

## 2013-12-18 MED ORDER — VANCOMYCIN HCL 10 G IV SOLR
1500.0000 mg | INTRAVENOUS | Status: DC
  Administered 2013-12-19: 1500 mg via INTRAVENOUS
  Filled 2013-12-18 (×2): qty 1500

## 2013-12-18 MED ORDER — ENOXAPARIN SODIUM 40 MG/0.4ML ~~LOC~~ SOLN
40.0000 mg | SUBCUTANEOUS | Status: DC
  Administered 2013-12-18: 40 mg via SUBCUTANEOUS
  Filled 2013-12-18 (×2): qty 0.4

## 2013-12-18 MED ORDER — SODIUM CHLORIDE 0.9 % IV SOLN
INTRAVENOUS | Status: DC
  Administered 2013-12-18: 21:00:00 via INTRAVENOUS

## 2013-12-18 MED ORDER — HYDRALAZINE HCL 25 MG PO TABS
25.0000 mg | ORAL_TABLET | Freq: Once | ORAL | Status: AC
  Administered 2013-12-18: 25 mg via ORAL
  Filled 2013-12-18 (×2): qty 1

## 2013-12-18 MED ORDER — ONDANSETRON HCL 4 MG/2ML IJ SOLN
4.0000 mg | Freq: Once | INTRAMUSCULAR | Status: AC
  Administered 2013-12-18: 4 mg via INTRAVENOUS
  Filled 2013-12-18: qty 2

## 2013-12-18 MED ORDER — HYDROMORPHONE HCL PF 1 MG/ML IJ SOLN
1.0000 mg | Freq: Once | INTRAMUSCULAR | Status: AC
  Administered 2013-12-18: 1 mg via INTRAVENOUS
  Filled 2013-12-18: qty 1

## 2013-12-18 MED ORDER — INSULIN NPH (HUMAN) (ISOPHANE) 100 UNIT/ML ~~LOC~~ SUSP
20.0000 [IU] | Freq: Every day | SUBCUTANEOUS | Status: DC
  Administered 2013-12-18 – 2013-12-22 (×5): 20 [IU] via SUBCUTANEOUS
  Filled 2013-12-18: qty 10

## 2013-12-18 MED ORDER — ONDANSETRON HCL 4 MG PO TABS: 4.0000 mg | ORAL_TABLET | Freq: Four times a day (QID) | ORAL | Status: DC | PRN

## 2013-12-18 MED ORDER — INSULIN NPH (HUMAN) (ISOPHANE) 100 UNIT/ML ~~LOC~~ SUSP
25.0000 [IU] | Freq: Every day | SUBCUTANEOUS | Status: DC
  Administered 2013-12-20 – 2013-12-23 (×4): 25 [IU] via SUBCUTANEOUS
  Filled 2013-12-18 (×2): qty 10

## 2013-12-18 MED ORDER — HYDROCODONE-ACETAMINOPHEN 5-325 MG PO TABS: 1.0000 | ORAL_TABLET | ORAL | Status: DC | PRN

## 2013-12-18 MED ORDER — PIPERACILLIN-TAZOBACTAM 3.375 G IVPB
3.3750 g | Freq: Three times a day (TID) | INTRAVENOUS | Status: DC
  Administered 2013-12-19 – 2013-12-20 (×5): 3.375 g via INTRAVENOUS
  Filled 2013-12-18 (×6): qty 50

## 2013-12-18 MED ORDER — HYDROMORPHONE HCL PF 1 MG/ML IJ SOLN
1.0000 mg | INTRAMUSCULAR | Status: DC | PRN
  Administered 2013-12-18 – 2013-12-22 (×17): 1 mg via INTRAVENOUS
  Filled 2013-12-18 (×17): qty 1

## 2013-12-18 MED ORDER — ACETAMINOPHEN 325 MG PO TABS: 650.0000 mg | ORAL_TABLET | Freq: Four times a day (QID) | ORAL | Status: DC | PRN

## 2013-12-18 MED ORDER — ACETAMINOPHEN 650 MG RE SUPP: 650.0000 mg | Freq: Four times a day (QID) | RECTAL | Status: DC | PRN

## 2013-12-18 MED ORDER — PIPERACILLIN-TAZOBACTAM 3.375 G IVPB
3.3750 g | Freq: Once | INTRAVENOUS | Status: AC
  Administered 2013-12-18: 3.375 g via INTRAVENOUS
  Filled 2013-12-18: qty 50

## 2013-12-18 MED ORDER — VANCOMYCIN HCL 10 G IV SOLR
2000.0000 mg | Freq: Once | INTRAVENOUS | Status: AC
  Administered 2013-12-18: 2000 mg via INTRAVENOUS
  Filled 2013-12-18 (×2): qty 2000

## 2013-12-18 NOTE — H&P (Signed)
[facility] History and Physical  [name] [id number] DOB: May 13, 1961 DOA: 12/18/2013  Referring physician: ER physician PCP: Leonard Downing, MD   Chief Complaint: right great toe necrotic ulcer  HPI:  53 year old male with past medical history of hypertension, diabetes with complication of previous toe amputation and poor wound heeling who presented to [facility] ED 12/18/2013 with complaints of poor ulcer healing on the right great toe. He has had this infection for some time now, months, has been seen in wound care center but right great toe ulceration has progressively gotten worse now with purulent drainage and he was told he may even require an amputation. No fevers or chills at home. No other complaints of chest pain, nausea and vomiting. No abdominal pain. provided by the patient.  In ED, vital signs are stable with BP 162/90, HR 72 and T max 98.6 F. WBC count was 13 and a hemoglobin 10.2. Creatinine was 2.68 and baseline is around 2.4. X rays of the right foot revealed possible osteomyelitis of first distal phalanx. Pt was started on broad spectrum antibiotics and ortho has been consulted.  Assessment and Plan:  Principal Problem:   Necrosis of toe, right great toe - appreciate orthopedic consult and recommendations  - obtain MRI and ABI - continue vanco and zosyn - pain management with dilaudid 1 mg every 2 hours PRN  Active Problems:   Osteomyelitis - on vanco and zosyn - obtain MRI w/o contrast for further evaluation    Leukocytosis - likely due to gangrenous right great toe   Anemia of chronic disease - secondary to chronic kidney disease - hemoglobin 10.2 on the admission - no indications for transfusion   CKD (chronic kidney disease) stage 4, GFR 15-29 ml/min - baseline creatinine is 2.4 and on this admission 2.68 - pt on lisinopril which we will hold for now - follow up BMP in am   Hypertension - hold lisinopril due to renal insufficiency - add  hydralazine for better BP control  Radiological Exams on Admission: Dg Foot Complete Right 12/18/2013    IMPRESSION: Probable lytic destruction of distal portion of first distal phalanx is noted suggesting osteomyelitis.   Electronically Signed   By: Sabino Dick M.D.   On: 12/18/2013 16:21    Code Status: Full Family Communication: Pt at bedside Disposition Plan: Admit for further evaluation  Leisa Lenz, MD  [facility] Pager [phone]  Review of Systems:  Constitutional: Negative for fever, chills and malaise/fatigue. Negative for diaphoresis.  HENT: Negative for hearing loss, ear pain, nosebleeds, congestion, sore throat, neck pain, tinnitus and ear discharge.   Eyes: Negative for blurred vision, double vision, photophobia, pain, discharge and redness.  Respiratory: Negative for cough, hemoptysis, sputum production, shortness of breath, wheezing and stridor.   Cardiovascular: Negative for chest pain, palpitations, orthopnea, claudication and leg swelling.  Gastrointestinal: Negative for nausea, vomiting and abdominal pain. Negative for heartburn, constipation, blood in stool and melena.  Genitourinary: Negative for dysuria, urgency, frequency, hematuria and flank pain.  Musculoskeletal: Negative for myalgias, back pain, joint pain and falls.  Skin: right great toe necrosis Neurological: Negative for dizziness and weakness. Negative for tingling, tremors, sensory change, speech change, focal weakness, loss of consciousness and headaches.  Endo/Heme/Allergies: Negative for environmental allergies and polydipsia. Does not bruise/bleed easily.  Psychiatric/Behavioral: Negative for suicidal ideas. The patient is not nervous/anxious.      Past Medical History  Diagnosis Date  . GERD (gastroesophageal reflux disease)   . HTN (hypertension)   .  Diabetes mellitus    Past Surgical History  Procedure Laterality Date  . Toe amputation     Social History:  reports that he has  never smoked. He does not have any smokeless tobacco history on file. He reports that he does not drink alcohol or use illicit drugs.  No Known Allergies  Family History: Family medical history significant for HTN, HLD   Prior to Admission medications   Medication Sig Start Date End Date Taking? Authorizing Provider  doxycycline (MONODOX) 100 MG capsule Take 100 mg by mouth 2 (two) times daily.   Yes Historical Provider, MD  insulin NPH-insulin regular (NOVOLIN 70/30) (70-30) 100 UNIT/ML injection Inject 20-25 Units into the skin See admin instructions. 25 units in the morning, 20 units at night   Yes Historical Provider, MD  lisinopril (PRINIVIL,ZESTRIL) 10 MG tablet Take 10 mg by mouth daily.   Yes Historical Provider, MD   Physical Exam: Filed Vitals:   12/18/13 1102 12/18/13 1530 12/18/13 1803  BP: 173/104 193/93 162/90  Pulse: 77 77 72  Temp: 98.6 F (37 C)    TempSrc: Oral    Resp: 14    Height: 6\' 4"  (1.93 m)    Weight: 106.595 kg (235 lb)    SpO2: 100% 99% 97%    Physical Exam  Constitutional: Appears well-developed and well-nourished. No distress.  HENT: Normocephalic. No tonsillar erythema or exudates Eyes: Conjunctivae and EOM are normal. PERRLA, no scleral icterus.  Neck: Normal ROM. Neck supple. No JVD.  CVS: RRR, S1/S2 +, no murmurs, no gallops, no carotid bruit.  Pulmonary: Effort and breath sounds normal, no stridor, rhonchi, wheezes, rales.  Abdominal: Soft. BS +,  no distension, tenderness, rebound or guarding.  Musculoskeletal: Normal range of motion. No edema and no tenderness.  Lymphadenopathy: No lymphadenopathy noted, cervical, inguinal. Neuro: Alert. Normal reflexes, muscle tone coordination. No cranial nerve deficit. Skin: right great toe necrosis, dressing in place  Psychiatric: Normal mood and affect. Behavior, judgment, thought content normal.   Labs on Admission:  Basic Metabolic Panel:  Recent Labs Lab 12/18/13 1116  NA 141  K 4.8  CL 105   CO2 20  GLUCOSE 184*  BUN 43*  CREATININE 2.68*  CALCIUM 8.8   Liver Function Tests: No results found for this basename: AST, ALT, ALKPHOS, BILITOT, PROT, ALBUMIN,  in the last 168 hours No results found for this basename: LIPASE, AMYLASE,  in the last 168 hours No results found for this basename: AMMONIA,  in the last 168 hours CBC:  Recent Labs Lab 12/18/13 1116  WBC 13.0*  HGB 10.2*  HCT 31.9*  MCV 73.5*  PLT 246   Cardiac Enzymes: No results found for this basename: CKTOTAL, CKMB, CKMBINDEX, TROPONINI,  in the last 168 hours BNP: No components found with this basename: POCBNP,  CBG:  Recent Labs Lab 12/15/13 1211 12/16/13 1015 12/16/13 1209 12/17/13 1055 12/17/13 1253  GLUCAP 162* 164* 122* 203* 168*    If 7PM-7AM, please contact night-coverage [email or web address] Password [facility] 12/18/2013, 6:22 PM

## 2013-12-18 NOTE — ED Provider Notes (Addendum)
CSN: [id number]     Arrival date & time 12/18/13  1031 History   First MD Initiated Contact with Patient 12/18/13 1502     Chief Complaint  Patient presents with  . Nail Problem   (Consider location/radiation/quality/duration/timing/severity/associated sxs/prior Treatment) The history is provided by the patient.  pt w hx iddm, c/o infected diabetic foot ulcer and discolored/gangrenous right great toe. States problems w that foot x years. States one toe amputated yrs ago and another a few months ago. Had non healing ulcer/wound to foot, and in past week, black discoloration great toe w purulent drainage from wound. Subjective chills/sweats, no documented fevers. No spreading redness. No leg swelling. 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      Past Medical History  Diagnosis Date  . GERD (gastroesophageal reflux disease)   . HTN (hypertension)   . Diabetes mellitus    Past Surgical History  Procedure Laterality Date  . Toe amputation     History reviewed. No pertinent family history. History  Substance Use Topics  . Smoking status: Never Smoker   . Smokeless tobacco: Not on file  . Alcohol Use: No    Review of Systems  Constitutional: Positive for chills. Negative for fever.  HENT: Negative for sore throat.   Eyes: Negative for redness.  Respiratory: Negative for cough and shortness of breath.   Cardiovascular: Negative for chest pain.  Gastrointestinal: Negative for vomiting, abdominal pain and diarrhea.  Genitourinary: Negative for flank pain.  Musculoskeletal: Negative for back pain and neck pain.  Skin: Positive for wound.  Neurological: Negative for headaches.  Hematological: Does not bruise/bleed easily.  Psychiatric/Behavioral: Negative for confusion.    Allergies  Review of patient's allergies indicates no known allergies.  Home Medications   Current Outpatient Rx  Name  Route  Sig  Dispense  Refill  . doxycycline  (MONODOX) 100 MG capsule   Oral   Take 100 mg by mouth 2 (two) times daily.         . insulin NPH-insulin regular (NOVOLIN 70/30) (70-30) 100 UNIT/ML injection   Subcutaneous   Inject 20-25 Units into the skin See admin instructions. 25 units in the morning, 20 units at night         . lisinopril (PRINIVIL,ZESTRIL) 10 MG tablet   Oral   Take 10 mg by mouth daily.          BP 173/104  Pulse 77  Temp(Src) 98.6 F (37 C) (Oral)  Resp 14  Ht 6\' 4"  (1.93 m)  Wt 235 lb (106.595 kg)  BMI 28.62 kg/m2  SpO2 100% Physical Exam  Nursing note and vitals reviewed. Constitutional: He is oriented to person, place, and time. He appears well-developed and well-nourished. No distress.  HENT:  Head: Atraumatic.  Mouth/Throat: Oropharynx is clear and moist.  Eyes: Conjunctivae are normal. Pupils are equal, round, and reactive to light.  Neck: Neck supple. No tracheal deviation present.  Cardiovascular: Normal rate, regular rhythm, normal heart sounds and intact distal pulses.   Pulmonary/Chest: Effort normal and breath sounds normal. No accessory muscle usage. No respiratory distress.  Abdominal: Soft. Bowel sounds are normal. He exhibits no distension. There is no tenderness.  Musculoskeletal: Normal range of motion. He exhibits no edema.  Gangrenous right great toe. Adjacent skin breakdown w purulent drainage. Pt palp. No leg/calf swelling, pain or tenderness.   Neurological: He is alert and oriented to person, place, and time.  Skin: Skin is  warm and dry. He is not diaphoretic.  Psychiatric: He has a normal mood and affect.    ED Course  Procedures (including critical care time)   Results for orders placed during the hospital encounter of 12/18/13  CBC      Result Value Range   WBC 13.0 (*) 4.0 - 10.5 K/uL   RBC 4.34  4.22 - 5.81 MIL/uL   Hemoglobin 10.2 (*) 13.0 - 17.0 g/dL   HCT 31.9 (*) 39.0 - 52.0 %   MCV 73.5 (*) 78.0 - 100.0 fL   MCH 23.5 (*) 26.0 - 34.0 pg   MCHC 32.0   30.0 - 36.0 g/dL   RDW 13.6  11.5 - 15.5 %   Platelets 246  150 - 400 K/uL  BASIC METABOLIC PANEL      Result Value Range   Sodium 141  137 - 147 mEq/L   Potassium 4.8  3.7 - 5.3 mEq/L   Chloride 105  96 - 112 mEq/L   CO2 20  19 - 32 mEq/L   Glucose, Bld 184 (*) 70 - 99 mg/dL   BUN 43 (*) 6 - 23 mg/dL   Creatinine, Ser 2.68 (*) 0.50 - 1.35 mg/dL   Calcium 8.8  8.4 - 10.5 mg/dL   GFR calc non Af Amer 25 (*) >90 mL/min   GFR calc Af Amer 29 (*) >90 mL/min   Dg Foot Complete Right  12/18/2013   CLINICAL DATA:  Toe pain and wounds.  EXAM: RIGHT FOOT COMPLETE - 3+ VIEW  COMPARISON:  August 20, 2013.  FINDINGS: Status post surgical amputation of second and third toes. There does appear to be destruction of the distal portion of the first distal phalanx suggesting osteomyelitis. Vascular calcifications are noted.  IMPRESSION: Probable lytic destruction of distal portion of first distal phalanx is noted suggesting osteomyelitis.   Electronically Signed   By: Sabino Dick M.D.   On: 12/18/2013 16:21      MDM  Iv ns. Zosyn iv. Labs.  Reviewed nursing notes and prior charts for additional history.   Med service called to admit.  Pt requests pain rx. Dilaudid 1 mg iv.  Cr 2.68, prior cr noted at 2.4.  Paged unassigned on call x several x, no response.  [facility] called back to say they are capped.  Pt updated on delay.  [facility] states capped.  [facility] paged - will admit.   Pt states has seen Dr Doran Durand in past re toe amputations, diabetic foot - discussed w on call ortho for him, Dr Wynelle Link - he will have someone from service eval/consult on pt.    Anticipate that pt will need iv abx, and great toe amputation during in hospital stay.    Mirna Mires, MD 12/18/13 [phone]

## 2013-12-18 NOTE — Progress Notes (Signed)
ANTIBIOTIC CONSULT NOTE - INITIAL  Pharmacy Consult for vancomycin and zosyn Indication: right necrotic toe/osteo  No Known Allergies  Patient Measurements: Height: 6\' 4"  (193 cm) Weight: 235 lb (106.595 kg) IBW/kg (Calculated) : 86.8   Vital Signs: Temp: 98.6 F (37 C) (01/15 1102) Temp src: Oral (01/15 1102) BP: 166/96 mmHg (01/15 1853) Pulse Rate: 72 (01/15 1803) Intake/Output from previous day:   Intake/Output from this shift:    Labs:  Recent Labs  12/18/13 1116  WBC 13.0*  HGB 10.2*  PLT 246  CREATININE 2.68*   Estimated Creatinine Clearance: 42.2 ml/min (by C-G formula based on Cr of 2.68). No results found for this basename: VANCOTROUGH, VANCOPEAK, VANCORANDOM, GENTTROUGH, GENTPEAK, GENTRANDOM, TOBRATROUGH, TOBRAPEAK, TOBRARND, AMIKACINPEAK, AMIKACINTROU, AMIKACIN,  in the last 72 hours   Microbiology: No results found for this or any previous visit (from the past 720 hour(s)).  Medical History: Past Medical History  Diagnosis Date  . GERD (gastroesophageal reflux disease)   . HTN (hypertension)   . Diabetes mellitus     Medications:  Doxy 100 bid pta  Assessment: 53 year old male with past medical history of hypertension, diabetes with complication of previous toe amputation and poor wound heeling who presented to [facility] ED 12/18/2013 with complaints of poor ulcer healing on the right great toe. Patient was on doxycycline prior to admission. Wound has gotten progressively worse, concern for osteomyelitis received orders to start vancomycin and zosyn. Currently afebrile with wbc count of 13.  Goal of Therapy:  Vancomycin trough level 15-20 mcg/ml  Plan:  Measure antibiotic drug levels at steady state Follow up culture results Vancomycin 2g IV now then 1500mg  q24 hours Zosyn 3.375g IV q 8 hours - infuse each dose over 4 hours  Erin Hearing PharmD., BCPS Clinical Pharmacist Pager [phone] 12/18/2013 7:16 PM

## 2013-12-18 NOTE — ED Notes (Signed)
Pt reports his doctor instructed him to come to ed and let us know that he needs his toe amputated. States he's been treating R great toe for infection for months and its not getting better. C/o pain in the toe

## 2013-12-19 ENCOUNTER — Inpatient Hospital Stay (HOSPITAL_COMMUNITY): Payer: Managed Care, Other (non HMO) | Admitting: Anesthesiology

## 2013-12-19 ENCOUNTER — Encounter (HOSPITAL_COMMUNITY)
Admission: EM | Disposition: A | Discharge status: Home Health | Payer: Self-pay | Source: Home / Self Care | Attending: Internal Medicine

## 2013-12-19 ENCOUNTER — Encounter (HOSPITAL_COMMUNITY): Payer: Self-pay | Admitting: Anesthesiology

## 2013-12-19 ENCOUNTER — Inpatient Hospital Stay (HOSPITAL_COMMUNITY): Payer: Managed Care, Other (non HMO)

## 2013-12-19 ENCOUNTER — Encounter (HOSPITAL_COMMUNITY): Payer: Managed Care, Other (non HMO) | Admitting: Anesthesiology

## 2013-12-19 DIAGNOSIS — E119 Type 2 diabetes mellitus without complications: Secondary | ICD-10-CM

## 2013-12-19 DIAGNOSIS — Z794 Long term (current) use of insulin: Secondary | ICD-10-CM

## 2013-12-19 HISTORY — PX: AMPUTATION: SHX166

## 2013-12-19 LAB — CBC
HCT: 28.4 % — ABNORMAL LOW (ref 39.0–52.0)
HEMOGLOBIN: 8.8 g/dL — AB (ref 13.0–17.0)
MCH: 22.8 pg — ABNORMAL LOW (ref 26.0–34.0)
MCHC: 31 g/dL (ref 30.0–36.0)
MCV: 73.6 fL — AB (ref 78.0–100.0)
Platelets: 253 10*3/uL (ref 150–400)
RBC: 3.86 MIL/uL — AB (ref 4.22–5.81)
RDW: 13.7 % (ref 11.5–15.5)
WBC: 11.4 10*3/uL — ABNORMAL HIGH (ref 4.0–10.5)

## 2013-12-19 LAB — COMPREHENSIVE METABOLIC PANEL
ALBUMIN: 2.1 g/dL — AB (ref 3.5–5.2)
ALT: 15 U/L (ref 0–53)
AST: 14 U/L (ref 0–37)
Alkaline Phosphatase: 87 U/L (ref 39–117)
BUN: 43 mg/dL — ABNORMAL HIGH (ref 6–23)
CALCIUM: 8.4 mg/dL (ref 8.4–10.5)
CHLORIDE: 106 meq/L (ref 96–112)
CO2: 22 mEq/L (ref 19–32)
CREATININE: 2.65 mg/dL — AB (ref 0.50–1.35)
GFR calc Af Amer: 30 mL/min — ABNORMAL LOW (ref >90)
GFR calc non Af Amer: 26 mL/min — ABNORMAL LOW (ref >90)
Glucose, Bld: 179 mg/dL — ABNORMAL HIGH (ref 70–99)
Potassium: 4.4 mEq/L (ref 3.7–5.3)
Sodium: 139 mEq/L (ref 137–147)
TOTAL PROTEIN: 6.7 g/dL (ref 6.0–8.3)

## 2013-12-19 LAB — GLUCOSE, CAPILLARY
GLUCOSE-CAPILLARY: 160 mg/dL — AB (ref 70–99)
GLUCOSE-CAPILLARY: 151 mg/dL — AB (ref 70–99)
GLUCOSE-CAPILLARY: 104 mg/dL — AB (ref 70–99)
GLUCOSE-CAPILLARY: 166 mg/dL — AB (ref 70–99)
Glucose-Capillary: 104 mg/dL — ABNORMAL HIGH (ref 70–99)

## 2013-12-19 LAB — TSH: TSH: 1.438 u[IU]/mL (ref 0.350–4.500)

## 2013-12-19 LAB — SURGICAL PCR SCREEN
MRSA, PCR: NEGATIVE
Staphylococcus aureus: NEGATIVE

## 2013-12-19 LAB — HEMOGLOBIN A1C
Hgb A1c MFr Bld: 7.2 % — ABNORMAL HIGH (ref <5.7)
Mean Plasma Glucose: 160 mg/dL — ABNORMAL HIGH (ref <117)

## 2013-12-19 SURGERY — AMPUTATION BELOW KNEE
Anesthesia: General | Site: Foot | Laterality: Right

## 2013-12-19 MED ORDER — ENOXAPARIN SODIUM 30 MG/0.3ML ~~LOC~~ SOLN
30.0000 mg | SUBCUTANEOUS | Status: DC
  Administered 2013-12-20 – 2013-12-22 (×3): 30 mg via SUBCUTANEOUS
  Filled 2013-12-19 (×3): qty 0.3

## 2013-12-19 MED ORDER — SENNA 8.6 MG PO TABS
2.0000 | ORAL_TABLET | Freq: Two times a day (BID) | ORAL | Status: DC
  Administered 2013-12-19 – 2013-12-23 (×5): 17.2 mg via ORAL
  Filled 2013-12-19 (×9): qty 2

## 2013-12-19 MED ORDER — ONDANSETRON HCL 4 MG PO TABS: 4.0000 mg | ORAL_TABLET | Freq: Four times a day (QID) | ORAL | Status: DC | PRN

## 2013-12-19 MED ORDER — LACTATED RINGERS IV SOLN
INTRAVENOUS | Status: DC | PRN
  Administered 2013-12-19: 18:00:00 via INTRAVENOUS

## 2013-12-19 MED ORDER — HYDRALAZINE HCL 20 MG/ML IJ SOLN
10.0000 mg | Freq: Four times a day (QID) | INTRAMUSCULAR | Status: DC | PRN
  Administered 2013-12-19: 10 mg via INTRAVENOUS
  Administered 2013-12-20: 18:00:00 via INTRAVENOUS
  Administered 2013-12-21 – 2013-12-23 (×3): 10 mg via INTRAVENOUS
  Filled 2013-12-19 (×5): qty 1

## 2013-12-19 MED ORDER — DOCUSATE SODIUM 100 MG PO CAPS
100.0000 mg | ORAL_CAPSULE | Freq: Two times a day (BID) | ORAL | Status: DC
  Administered 2013-12-19 – 2013-12-23 (×6): 100 mg via ORAL
  Filled 2013-12-19 (×10): qty 1

## 2013-12-19 MED ORDER — 0.9 % SODIUM CHLORIDE (POUR BTL) OPTIME
TOPICAL | Status: DC | PRN
  Administered 2013-12-19: 1000 mL

## 2013-12-19 MED ORDER — FENTANYL CITRATE 0.05 MG/ML IJ SOLN
INTRAMUSCULAR | Status: DC | PRN
  Administered 2013-12-19: 100 ug via INTRAVENOUS

## 2013-12-19 MED ORDER — INSULIN ASPART 100 UNIT/ML ~~LOC~~ SOLN
4.0000 [IU] | Freq: Three times a day (TID) | SUBCUTANEOUS | Status: DC
  Administered 2013-12-20: 4 [IU] via SUBCUTANEOUS
  Administered 2013-12-20: 08:00:00 via SUBCUTANEOUS
  Administered 2013-12-21 (×2): 4 [IU] via SUBCUTANEOUS
  Administered 2013-12-21: 13:00:00 via SUBCUTANEOUS
  Administered 2013-12-22 – 2013-12-23 (×4): 4 [IU] via SUBCUTANEOUS

## 2013-12-19 MED ORDER — SODIUM CHLORIDE 0.9 % IV SOLN: INTRAVENOUS | Status: DC

## 2013-12-19 MED ORDER — OXYCODONE HCL 5 MG/5ML PO SOLN: 5.0000 mg | Freq: Once | ORAL | Status: AC | PRN

## 2013-12-19 MED ORDER — HYDROMORPHONE HCL PF 1 MG/ML IJ SOLN: 0.2500 mg | INTRAMUSCULAR | Status: DC | PRN

## 2013-12-19 MED ORDER — SODIUM CHLORIDE 0.9 % IV SOLN
INTRAVENOUS | Status: AC
  Administered 2013-12-20: 08:00:00 via INTRAVENOUS

## 2013-12-19 MED ORDER — LIDOCAINE HCL (CARDIAC) 20 MG/ML IV SOLN
INTRAVENOUS | Status: DC | PRN
  Administered 2013-12-19: 60 mg via INTRAVENOUS

## 2013-12-19 MED ORDER — INSULIN ASPART 100 UNIT/ML ~~LOC~~ SOLN
0.0000 [IU] | Freq: Three times a day (TID) | SUBCUTANEOUS | Status: DC
  Administered 2013-12-20: 08:00:00 via SUBCUTANEOUS
  Administered 2013-12-20: 3 [IU] via SUBCUTANEOUS
  Administered 2013-12-21: 2 [IU] via SUBCUTANEOUS
  Administered 2013-12-21: 13:00:00 via SUBCUTANEOUS
  Administered 2013-12-22: 2 [IU] via SUBCUTANEOUS
  Administered 2013-12-22: 3 [IU] via SUBCUTANEOUS
  Administered 2013-12-23: 2 [IU] via SUBCUTANEOUS

## 2013-12-19 MED ORDER — ONDANSETRON HCL 4 MG/2ML IJ SOLN: 4.0000 mg | Freq: Four times a day (QID) | INTRAMUSCULAR | Status: DC | PRN

## 2013-12-19 MED ORDER — INFLUENZA VAC SPLIT QUAD 0.5 ML IM SUSP
0.5000 mL | INTRAMUSCULAR | Status: AC
  Administered 2013-12-20: 0.5 mL via INTRAMUSCULAR
  Filled 2013-12-19: qty 0.5

## 2013-12-19 MED ORDER — PROPOFOL 10 MG/ML IV BOLUS
INTRAVENOUS | Status: DC | PRN
  Administered 2013-12-19: 200 mg via INTRAVENOUS

## 2013-12-19 MED ORDER — OXYCODONE HCL 5 MG PO TABS
5.0000 mg | ORAL_TABLET | ORAL | Status: DC | PRN
  Administered 2013-12-19 – 2013-12-20 (×2): 10 mg via ORAL
  Administered 2013-12-20: 5 mg via ORAL
  Administered 2013-12-20 – 2013-12-23 (×12): 10 mg via ORAL
  Filled 2013-12-19: qty 2
  Filled 2013-12-19: qty 1
  Filled 2013-12-19 (×10): qty 2
  Filled 2013-12-19: qty 1
  Filled 2013-12-19 (×4): qty 2

## 2013-12-19 MED ORDER — OXYCODONE HCL 5 MG PO TABS: 5.0000 mg | ORAL_TABLET | Freq: Once | ORAL | Status: AC | PRN

## 2013-12-19 SURGICAL SUPPLY — 59 items
BANDAGE ELASTIC 6 VELCRO ST LF (GAUZE/BANDAGES/DRESSINGS) IMPLANT
BANDAGE ESMARK 6X9 LF (GAUZE/BANDAGES/DRESSINGS) ×1 IMPLANT
BLADE LONG MED 31MMX9MM (MISCELLANEOUS) ×1
BLADE LONG MED 31X9 (MISCELLANEOUS) ×2 IMPLANT
BLADE SAW RECIP 87.9 MT (BLADE) ×3 IMPLANT
BNDG COHESIVE 6X5 TAN STRL LF (GAUZE/BANDAGES/DRESSINGS) ×6 IMPLANT
BNDG ESMARK 6X9 LF (GAUZE/BANDAGES/DRESSINGS) ×3
CHLORAPREP W/TINT 26ML (MISCELLANEOUS) ×3 IMPLANT
CLOTH BEACON ORANGE TIMEOUT ST (SAFETY) ×3 IMPLANT
CONT SPEC 4OZ CLIKSEAL STRL BL (MISCELLANEOUS) ×3 IMPLANT
COVER SURGICAL LIGHT HANDLE (MISCELLANEOUS) ×3 IMPLANT
CUFF TOURNIQUET SINGLE 34IN LL (TOURNIQUET CUFF) IMPLANT
CUFF TOURNIQUET SINGLE 44IN (TOURNIQUET CUFF) IMPLANT
DRAPE EXTREMITY T 121X128X90 (DRAPE) ×3 IMPLANT
DRAPE INCISE IOBAN 66X45 STRL (DRAPES) ×3 IMPLANT
DRAPE PROXIMA HALF (DRAPES) ×6 IMPLANT
DRAPE U-SHAPE 47X51 STRL (DRAPES) ×6 IMPLANT
DRSG MEPITEL 4X7.2 (GAUZE/BANDAGES/DRESSINGS) ×3 IMPLANT
DRSG PAD ABDOMINAL 8X10 ST (GAUZE/BANDAGES/DRESSINGS) ×9 IMPLANT
ELECT CAUTERY BLADE 6.4 (BLADE) IMPLANT
ELECT REM PT RETURN 9FT ADLT (ELECTROSURGICAL) ×3
ELECTRODE REM PT RTRN 9FT ADLT (ELECTROSURGICAL) ×1 IMPLANT
EVACUATOR 1/8 PVC DRAIN (DRAIN) IMPLANT
GLOVE BIO SURGEON STRL SZ7 (GLOVE) ×3 IMPLANT
GLOVE BIO SURGEON STRL SZ8 (GLOVE) ×3 IMPLANT
GLOVE BIOGEL PI IND STRL 7.5 (GLOVE) ×1 IMPLANT
GLOVE BIOGEL PI IND STRL 8 (GLOVE) ×1 IMPLANT
GLOVE BIOGEL PI INDICATOR 7.5 (GLOVE) ×2
GLOVE BIOGEL PI INDICATOR 8 (GLOVE) ×2
GOWN PREVENTION PLUS XLARGE (GOWN DISPOSABLE) ×3 IMPLANT
GOWN STRL NON-REIN LRG LVL3 (GOWN DISPOSABLE) ×3 IMPLANT
KIT BASIN OR (CUSTOM PROCEDURE TRAY) ×3 IMPLANT
KIT ROOM TURNOVER OR (KITS) ×3 IMPLANT
MANIFOLD NEPTUNE II (INSTRUMENTS) ×3 IMPLANT
NS IRRIG 1000ML POUR BTL (IV SOLUTION) ×3 IMPLANT
PACK GENERAL/GYN (CUSTOM PROCEDURE TRAY) ×3 IMPLANT
PAD ARMBOARD 7.5X6 YLW CONV (MISCELLANEOUS) ×6 IMPLANT
PAD CAST 4YDX4 CTTN HI CHSV (CAST SUPPLIES) ×1 IMPLANT
PADDING CAST COTTON 4X4 STRL (CAST SUPPLIES) ×2
PADDING CAST COTTON 6X4 STRL (CAST SUPPLIES) ×3 IMPLANT
SPONGE GAUZE 4X4 12PLY (GAUZE/BANDAGES/DRESSINGS) ×3 IMPLANT
SPONGE LAP 18X18 X RAY DECT (DISPOSABLE) IMPLANT
STAPLER VISISTAT 35W (STAPLE) IMPLANT
STOCKINETTE 6  STRL (DRAPES) ×2
STOCKINETTE 6 STRL (DRAPES) ×1 IMPLANT
STOCKINETTE IMPERVIOUS LG (DRAPES) IMPLANT
SUT ETHILON 2 0 FS 18 (SUTURE) ×9 IMPLANT
SUT MNCRL AB 3-0 PS2 18 (SUTURE) ×3 IMPLANT
SUT PDS 2 0 (SUTURE) ×6 IMPLANT
SUT PDS AB 0 CT 36 (SUTURE) ×3 IMPLANT
SUT PROLENE 3 0 PS 2 (SUTURE) ×3 IMPLANT
SUT PROLENE 3 0 SH 48 (SUTURE) IMPLANT
SUT SILK 2 0 (SUTURE) ×2
SUT SILK 2-0 18XBRD TIE 12 (SUTURE) ×1 IMPLANT
SWAB COLLECTION DEVICE MRSA (MISCELLANEOUS) IMPLANT
TOWEL OR 17X24 6PK STRL BLUE (TOWEL DISPOSABLE) ×3 IMPLANT
TOWEL OR 17X26 10 PK STRL BLUE (TOWEL DISPOSABLE) ×3 IMPLANT
TUBE ANAEROBIC SPECIMEN COL (MISCELLANEOUS) IMPLANT
WATER STERILE IRR 1000ML POUR (IV SOLUTION) ×3 IMPLANT

## 2013-12-19 NOTE — Progress Notes (Signed)
New Admission Note:  Arrival Method: Via stretcher with nurse tech Mental Orientation: A&OX4 Telemetry: N/A Assessment: Completed Skin: right great toe black, malodorous with purulent drainage Pain: denies any pain Admission: Completed [address] Orientation: Patient has been orientated to the room, unit and the staff. Family: at bedside  Orders have been reviewed and implemented. Will continue to monitor the patient. Call light has been placed within reach.  Leandro Reasoner BSN, RN  Phone Number: [phone] [facility]

## 2013-12-19 NOTE — Anesthesia Preprocedure Evaluation (Addendum)
Anesthesia Evaluation  Patient identified by MRN, date of birth, ID band Patient awake    Reviewed: Allergy & Precautions, H&P , NPO status , Patient's Chart, lab work & pertinent test results  Airway Mallampati: II TM Distance: >3 FB Neck ROM: Full    Dental no notable dental hx. (+) Partial Lower, Partial Upper and Dental Advisory Given   Pulmonary neg pulmonary ROS, Current Smoker,  Quit November 2014 breath sounds clear to auscultation  Pulmonary exam normal       Cardiovascular hypertension, On Medications negative cardio ROS  Rhythm:Regular Rate:Normal     Neuro/Psych negative neurological ROS  negative psych ROS   GI/Hepatic Neg liver ROS, GERD-  Controlled,  Endo/Other  diabetes, Type 1, Insulin Dependent  Renal/GU Renal InsufficiencyRenal disease  negative genitourinary   Musculoskeletal   Abdominal   Peds  Hematology negative hematology ROS (+) anemia ,   Anesthesia Other Findings   Reproductive/Obstetrics negative OB ROS                         Anesthesia Physical Anesthesia Plan  ASA: III  Anesthesia Plan: General   Post-op Pain Management:    Induction: Intravenous  Airway Management Planned: LMA  Additional Equipment:   Intra-op Plan:   Post-operative Plan: Extubation in OR  Informed Consent: I have reviewed the patients History and Physical, chart, labs and discussed the procedure including the risks, benefits and alternatives for the proposed anesthesia with the patient or authorized representative who has indicated his/her understanding and acceptance.   Dental advisory given  Plan Discussed with: CRNA and Surgeon  Anesthesia Plan Comments:        Anesthesia Quick Evaluation

## 2013-12-19 NOTE — Discharge Instructions (Signed)
[name], MD [place]  Please read the following information regarding your care after surgery.  Medications  You only need a prescription for the narcotic pain medicine (ex. oxycodone, Percocet, Norco).  All of the other medicines listed below are available over the counter. X acetominophen (Tylenol) 650 mg every 4-6 hours as you need for minor pain X oxycodone as prescribed for moderate to severe pain ?   Narcotic pain medicine (ex. oxycodone, Percocet, Vicodin) will cause constipation.  To prevent this problem, take the following medicines while you are taking any pain medicine. X docusate sodium (Colace) 100 mg twice a day X senna (Senokot) 2 tablets twice a day  X To help prevent blood clots, take an aspirin (325 mg) once a day for a month after surgery.  You should also get up every hour while you are awake to move around.    Weight Bearing ? Bear weight when you are able on your operated leg or foot. ? Bear weight only on the heel of your operated foot in the post-op shoe. X Do not bear any weight on the operated leg or foot.  Cast / Splint / Dressing X Keep your splint or cast clean and dry.  Dont put anything (coat hanger, pencil, etc) down inside of it.  If it gets damp, use a hair dryer on the cool setting to dry it.  If it gets soaked, call the office to schedule an appointment for a cast change. ? Remove your dressing 3 days after surgery and cover the incisions with dry dressings.    After your dressing, cast or splint is removed; you may shower, but do not soak or scrub the wound.  Allow the water to run over it, and then gently pat it dry.  Swelling It is normal for you to have swelling where you had surgery.  To reduce swelling and pain, keep your toes above your nose for at least 3 days after surgery.  It may be necessary to keep your foot or leg elevated for several weeks.  If it hurts, it should be elevated.  Follow Up Call my office at  [phone] when you are discharged from the hospital or surgery center to schedule an appointment to be seen two weeks after surgery.  Call my office at [phone] if you develop a fever >101.5 F, nausea, vomiting, bleeding from the surgical site or severe pain.

## 2013-12-19 NOTE — Progress Notes (Signed)
UR completed. Nyko Gell RN CCM Case Mgmt phone [phone] 

## 2013-12-19 NOTE — Progress Notes (Signed)
PT Cancellation Note  Patient Details Name: [name] MRN: [id number] DOB: [birth year]   Cancelled Treatment:    Reason Eval/Treat Not Completed: Other (comment); patient for amputation later today.  Wishes to hold off on PT until after surgery (reports no issues getting around right now.)  Will see pt in AM after surgery.  Patient interested in getting knee walker/scooter after surgery; feel this will be helpful to prevent any issues coming up on left foot which has had ulcerations in the past.  Please order upon d/c if you see fit.  Thanks   WYNN,CYNDI 12/19/2013, 9:28 AM

## 2013-12-19 NOTE — Brief Op Note (Signed)
12/18/2013 - 12/19/2013  7:20 PM  PATIENT:  [name]  53 y.o. male  PRE-OPERATIVE DIAGNOSIS:  RIGHT FOOT  POST-OPERATIVE DIAGNOSIS:    Procedure(s): 1.  TRANSMETATARSAL AMPUTATION RIGHT FOOT 2.  Right PERCUTANEOUS HEEL CORD LENGTHENING through separate incisions  SURGEON:  [name], MD  ASSISTANT: n/a  ANESTHESIA:   General  EBL:  minimal   TOURNIQUET:   Total Tourniquet Time Documented: Thigh (Right) - 32 minutes Total: Thigh (Right) - 32 minutes   COMPLICATIONS:  None apparent  DISPOSITION:  Extubated, awake and stable to recovery.  DICTATION ID:  [id number]

## 2013-12-19 NOTE — Progress Notes (Signed)
OT Cancellation Note  Patient Details Name: [name] MRN: [id number] DOB: 10-11-1961  .   Cancelled Treatment:    Reason Eval/Treat Not Completed: Other (comment) (sx in AM).  Pt scheduled for sx in AM, will hold OT eval until after sx.  12/19/2013 [name] OTR/L Pager [phone] Office [phone]

## 2013-12-19 NOTE — Transfer of Care (Signed)
Immediate Anesthesia Transfer of Care Note  Patient: [name]  Procedure(s) Performed: Procedure(s): TRANSMETATARSAL AMPUTATION RIGHT FOOT WITH INTRAOPERATIVE PERCUTANEOUS HEEL CORD LENGTHENING  (Right)  Patient Location: PACU  Anesthesia Type:General  Level of Consciousness: awake, alert  and oriented  Airway & Oxygen Therapy: Patient Spontanous Breathing and Patient connected to nasal cannula oxygen  Post-op Assessment: Report given to PACU RN and Post -op Vital signs reviewed and stable  Post vital signs: Reviewed and stable  Complications: No apparent anesthesia complications

## 2013-12-19 NOTE — Anesthesia Procedure Notes (Signed)
Procedure Name: LMA Insertion Date/Time: 12/19/2013 6:28 PM Performed by: Maude Leriche D Pre-anesthesia Checklist: Patient identified, Suction available, Emergency Drugs available, Patient being monitored and Timeout performed Patient Re-evaluated:Patient Re-evaluated prior to inductionOxygen Delivery Method: Circle system utilized Preoxygenation: Pre-oxygenation with 100% oxygen Intubation Type: IV induction Ventilation: Mask ventilation without difficulty LMA: LMA inserted and LMA with gastric port inserted LMA Size: 5.0 Number of attempts: 1 Placement Confirmation: positive ETCO2 and breath sounds checked- equal and bilateral Tube secured with: Tape Dental Injury: Teeth and Oropharynx as per pre-operative assessment

## 2013-12-19 NOTE — Consult Note (Signed)
Reason for Consult: Right great toe ulcer, possible osteomyelitis  Referring Physician: Leisa Lenz, MD  [name] is an 53 y.o. male.  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 ER and admitted for similar symptoms of right great toe ulcer.  Pt has been receiving treatment over the past several months at wound center for slow healing ulcer of the right great toe which recently became necrotic and gangrenous which prompted admission to hospital.  Pt states he has no pain in his right foot, he also denies N/V/F/C, chest pain, SOB, calf pain, or changes in appetite.  Pt is on vanc and zosyn since admission.  Past Medical History  Diagnosis Date  . GERD (gastroesophageal reflux disease)   . HTN (hypertension)   . Diabetes mellitus     Past Surgical History  Procedure Laterality Date  . Toe amputation      FH:  htn  Social History:  reports that he has never smoked. He does not have any smokeless tobacco history on file. He reports that he does not drink alcohol or use illicit drugs.  Allergies: No Known Allergies  Medications: I have reviewed the patient's current medications.  Results for orders placed during the hospital encounter of 12/18/13 (from the past 48 hour(s))  CBC     Status: Abnormal   Collection Time    12/18/13 11:16 AM      Result Value Range   WBC 13.0 (*) 4.0 - 10.5 K/uL   RBC 4.34  4.22 - 5.81 MIL/uL   Hemoglobin 10.2 (*) 13.0 - 17.0 g/dL   HCT 31.9 (*) 39.0 - 52.0 %   MCV 73.5 (*) 78.0 - 100.0 fL   MCH 23.5 (*) 26.0 - 34.0 pg   MCHC 32.0  30.0 - 36.0 g/dL   RDW 13.6  11.5 - 15.5 %   Platelets 246  150 - 400 K/uL  BASIC METABOLIC PANEL     Status: Abnormal   Collection Time    12/18/13 11:16 AM      Result Value Range   Sodium 141  137 - 147 mEq/L   Potassium 4.8  3.7 - 5.3 mEq/L   Chloride 105  96 - 112 mEq/L   CO2 20  19 - 32 mEq/L   Glucose,  Bld 184 (*) 70 - 99 mg/dL   BUN 43 (*) 6 - 23 mg/dL   Creatinine, Ser 2.68 (*) 0.50 - 1.35 mg/dL   Calcium 8.8  8.4 - 10.5 mg/dL   GFR calc non Af Amer 25 (*) >90 mL/min   GFR calc Af Amer 29 (*) >90 mL/min   Comment: (NOTE)     The eGFR has been calculated using the CKD EPI equation.     This calculation has not been validated in all clinical situations.     eGFR's persistently <90 mL/min signify possible Chronic Kidney     Disease.  COMPREHENSIVE METABOLIC PANEL     Status: Abnormal   Collection Time    12/18/13  9:00 PM      Result Value Range   Sodium 140  137 - 147 mEq/L   Potassium 4.3  3.7 - 5.3 mEq/L   Chloride 105  96 - 112 mEq/L   CO2 21  19 - 32 mEq/L   Glucose, Bld 227 (*) 70 - 99 mg/dL   BUN 45 (*) 6 - 23  mg/dL   Creatinine, Ser 2.62 (*) 0.50 - 1.35 mg/dL   Calcium 8.3 (*) 8.4 - 10.5 mg/dL   Total Protein 7.2  6.0 - 8.3 g/dL   Albumin 2.4 (*) 3.5 - 5.2 g/dL   AST 14  0 - 37 U/L   ALT 16  0 - 53 U/L   Alkaline Phosphatase 82  39 - 117 U/L   Total Bilirubin <0.2 (*) 0.3 - 1.2 mg/dL   GFR calc non Af Amer 26 (*) >90 mL/min   GFR calc Af Amer 30 (*) >90 mL/min   Comment: (NOTE)     The eGFR has been calculated using the CKD EPI equation.     This calculation has not been validated in all clinical situations.     eGFR's persistently <90 mL/min signify possible Chronic Kidney     Disease.  MAGNESIUM     Status: None   Collection Time    12/18/13  9:00 PM      Result Value Range   Magnesium 1.6  1.5 - 2.5 mg/dL  PHOSPHORUS     Status: None   Collection Time    12/18/13  9:00 PM      Result Value Range   Phosphorus 3.5  2.3 - 4.6 mg/dL  CBC WITH DIFFERENTIAL     Status: Abnormal   Collection Time    12/18/13  9:00 PM      Result Value Range   WBC 13.7 (*) 4.0 - 10.5 K/uL   RBC 3.68 (*) 4.22 - 5.81 MIL/uL   Hemoglobin 8.5 (*) 13.0 - 17.0 g/dL   HCT 27.1 (*) 39.0 - 52.0 %   MCV 73.6 (*) 78.0 - 100.0 fL   MCH 23.1 (*) 26.0 - 34.0 pg   MCHC 31.4  30.0 - 36.0  g/dL   RDW 13.6  11.5 - 15.5 %   Platelets 269  150 - 400 K/uL   Neutrophils Relative % 72  43 - 77 %   Lymphocytes Relative 12  12 - 46 %   Monocytes Relative 14 (*) 3 - 12 %   Eosinophils Relative 2  0 - 5 %   Basophils Relative 0  0 - 1 %   Neutro Abs 9.9 (*) 1.7 - 7.7 K/uL   Lymphs Abs 1.6  0.7 - 4.0 K/uL   Monocytes Absolute 1.9 (*) 0.1 - 1.0 K/uL   Eosinophils Absolute 0.3  0.0 - 0.7 K/uL   Basophils Absolute 0.0  0.0 - 0.1 K/uL   RBC Morphology POLYCHROMASIA PRESENT     Comment: TEARDROP CELLS     ELLIPTOCYTES  APTT     Status: Abnormal   Collection Time    12/18/13  9:00 PM      Result Value Range   aPTT 39 (*) 24 - 37 seconds   Comment:            IF BASELINE aPTT IS ELEVATED,     SUGGEST PATIENT RISK ASSESSMENT     BE USED TO DETERMINE APPROPRIATE     ANTICOAGULANT THERAPY.  PROTIME-INR     Status: None   Collection Time    12/18/13  9:00 PM      Result Value Range   Prothrombin Time 13.9  11.6 - 15.2 seconds   INR 1.09  0.00 - 1.49  TSH     Status: None   Collection Time    12/18/13  9:00 PM      Result Value Range  TSH 1.438  0.350 - 4.500 uIU/mL   Comment: Performed at [place], [facility]     Status: Abnormal   Collection Time    12/18/13 10:03 PM      Result Value Range   Glucose-Capillary 209 (*) 70 - 99 mg/dL  COMPREHENSIVE METABOLIC PANEL     Status: Abnormal   Collection Time    12/19/13  4:24 AM      Result Value Range   Sodium 139  137 - 147 mEq/L   Potassium 4.4  3.7 - 5.3 mEq/L   Chloride 106  96 - 112 mEq/L   CO2 22  19 - 32 mEq/L   Glucose, Bld 179 (*) 70 - 99 mg/dL   BUN 43 (*) 6 - 23 mg/dL   Creatinine, Ser 2.65 (*) 0.50 - 1.35 mg/dL   Calcium 8.4  8.4 - 10.5 mg/dL   Total Protein 6.7  6.0 - 8.3 g/dL   Albumin 2.1 (*) 3.5 - 5.2 g/dL   AST 14  0 - 37 U/L   ALT 15  0 - 53 U/L   Alkaline Phosphatase 87  39 - 117 U/L   Total Bilirubin <0.2 (*) 0.3 - 1.2 mg/dL   GFR calc non Af Amer 26 (*) >90 mL/min   GFR calc  Af Amer 30 (*) >90 mL/min   Comment: (NOTE)     The eGFR has been calculated using the CKD EPI equation.     This calculation has not been validated in all clinical situations.     eGFR's persistently <90 mL/min signify possible Chronic Kidney     Disease.  CBC     Status: Abnormal   Collection Time    12/19/13  4:24 AM      Result Value Range   WBC 11.4 (*) 4.0 - 10.5 K/uL   RBC 3.86 (*) 4.22 - 5.81 MIL/uL   Hemoglobin 8.8 (*) 13.0 - 17.0 g/dL   HCT 28.4 (*) 39.0 - 52.0 %   MCV 73.6 (*) 78.0 - 100.0 fL   MCH 22.8 (*) 26.0 - 34.0 pg   MCHC 31.0  30.0 - 36.0 g/dL   RDW 13.7  11.5 - 15.5 %   Platelets 253  150 - 400 K/uL    Mr Foot Right Wo Contrast  12/19/2013   CLINICAL DATA:  Diabetic patient with an ulcer on the right great toe.  EXAM: MRI OF THE RIGHT FOREFOOT WITHOUT CONTRAST  TECHNIQUE: Multiplanar, multisequence MR imaging was performed. No intravenous contrast was administered.  COMPARISON:  Plain films 06/25/2013, 08/20/2013 and 12/18/2013.  FINDINGS: The patient is status post amputation of the second toe and third ray at the level of the head of the third metatarsal. Extensive soft tissue edema is present about the foot, worst about the great toe. There is a focal fluid collection in the dorsal subcutaneous tissues measuring approximately 1.1 cm craniocaudal by 2.6 cm transverse by 4.5 cm long. The collection is centered between the heads of the second and third metatarsals and extending distally along the great toe. There also appears to be circumferential abscess formation about the great toe although lack of IV contrast somewhat limits evaluation. Skin ulceration is seen along the dorsal in medial margin of the great toe at the level of the head of the proximal phalanx.  There is edema throughout the proximal phalanx of the great toe with corresponding decreased T1 signal consistent with osteomyelitis. Markedly decreased T1 signal with areas of  decreased T2 signal also seen in the  distal phalanx of the great toe consistent with chronic osteomyelitis and osteonecrosis. No other evidence of osteomyelitis is identified. Well-marginated erosions in the head of the first metatarsal in appearance most compatible with changes from gout. Remote fracture of the head of the second metatarsal is noted.  IMPRESSION: Cellulitis about the great toe with changes of osteomyelitis throughout the proximal and distal phalanges. There is likely osteonecrosis of the distal phalanx.  Extensive abscess formation about the great toe extends proximally in the dorsal soft tissues between the distal first and second metatarsals as described above.  Erosions in the head of the first metatarsal have an appearance most suggestive of the sequelae of gout.  Status post amputation of the second toe and third ray at the level of the distal metatarsal.   Electronically Signed   By: Thomas  Dalessio M.D.   On: 12/19/2013 08:21   Dg Foot Complete Right  12/18/2013   CLINICAL DATA:  Toe pain and wounds.  EXAM: RIGHT FOOT COMPLETE - 3+ VIEW  COMPARISON:  August 20, 2013.  FINDINGS: Status post surgical amputation of second and third toes. There does appear to be destruction of the distal portion of the first distal phalanx suggesting osteomyelitis. Vascular calcifications are noted.  IMPRESSION: Probable lytic destruction of distal portion of first distal phalanx is noted suggesting osteomyelitis.   Electronically Signed   By: [name]  Green M.D.   On: 12/18/2013 16:21    Review of Systems  Constitutional: Negative.   HENT: Negative.   Eyes: Negative.   Respiratory: Negative.   Cardiovascular: Negative.   Gastrointestinal: Negative.   Musculoskeletal: Positive for back pain. Negative for falls, joint pain and neck pain.  Skin: Negative for itching and rash.  Neurological: Negative.   Psychiatric/Behavioral: The patient is not nervous/anxious.    Blood pressure 183/92, pulse 83, temperature 98.4 F (36.9 C),  temperature source Oral, resp. rate 18, height 6' 4" (1.93 m), weight 106.595 kg (235 lb), SpO2 100.00%. Physical Exam NAD, A/Ox3, appears stated age.  EOMI, mood and affect normal, respirations unlabored Right forefoot with 2nd and 3rd toe amputation presents with large dorso-lateral ulcer of the proximal great toe.  The remaining hallux is necrotic.  Ulcer is malodorous, draining, but with no exposed bone.  Probing of ulcer revealed palpated bone with cotton tip applicator. 1+ DP and PT pulses.  Normal sensation to light touch intact to the ankle but diminshed sens to LT at the forefoot.  5/5 strength with ankle DF, PF, inversion and eversion.  No lymphadenopathy noted on exam.  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  Pt was placed NPO, all blood thinning medications were halted.  I had detailed discussion with pt regarding surgical procedure and recovery as well as risks of surgery including but not limited to blood clots, infection, nerve damage, risk of other surgery, further amputation, and death.  He acknowledges these risks and would like to proceed with surgery.  [name], [name] S 12/19/2013, 8:24 AM   Agree with above note.  I've seen the patient, examined him and taken his history.  I agree with the plan.  I've explained to the patient that he could still require a BKA if the transmet amputation doesn't heal.  The risks and benefits of the alternative treatment options have been discussed in detail.  The patient wishes to proceed with surgery and   specifically understands risks of bleeding, infection, nerve damage, blood clots, need for additional surgery, amputation and death.    

## 2013-12-19 NOTE — Progress Notes (Signed)
Nutrition Brief Note  Patient identified on the Malnutrition Screening Tool (MST) Report. Pt reports that his usual weight is 235 lb, denies any weight changes at this time. States that his appetite prior to onset of symptoms was adequate. Eating well now.  Wt Readings from Last 15 Encounters:  12/18/13 235 lb (106.595 kg)  12/18/13 235 lb (106.595 kg)   We reviewed his diet and this RD made appropriate recommendations re: DM management. Recommended physical activity as able. Encouraged consistent and controlled complex CHO intake throughout the day.  Body mass index is 28.62 kg/(m^2). Patient meets criteria for overweight based on current BMI.   Current diet order is Carbohydrate Modified Medium, patient is consuming approximately 50-100% of meals at this time. Labs and medications reviewed.   No nutrition interventions warranted at this time. If nutrition issues arise, please consult RD.   Inda Coke MS, RD, LDN Pager: [phone] After-hours pager: [phone]

## 2013-12-19 NOTE — Progress Notes (Signed)
During progression meeting at [facility], Dr. Jerilee Hoh stated it was okay to continue IVF infusion while pt was NPO for surgery. Will continue to monitor.  Aaron Edelman, Emaleigh Guimond [place]

## 2013-12-19 NOTE — Progress Notes (Signed)
[facility] PROGRESS NOTE  [name] [id number] DOB: 1961/09/20 DOA: 12/18/2013 PCP: Leonard Downing, MD  Assessment/Plan: Osteomyelitis and Necrosis of Right Great Toe -Has been seen by ortho with plans for amputation ?today. -Continue vanc/zosyn. -May need ID consult for length of antibiotic management depending on findings of surgery and magnitude of amputation. -Continue vanc/zosyn for now. -MRI with Extensive abscess formation about the great toe extends proximally  in the dorsal soft tissues between the distal first and second  metatarsals in addition to osteomyelitis of great toe.   DM -NPH on hold as he is NPO in anticipation for surgery.  CKD Stage IV -At baseline.  HTN -BP remains elevated. -Lisinopril on hold given renal insufficiency. -Will order IV PRN hydralazine while he is NPO.  Code Status: Full COde Family Communication: Wife at bedside  Disposition Plan: To be dtermined; likely home   Consultants:  Ortho, [facility]   Antibiotics:  Vanc  Zosyn   Subjective: No complaints.  A little anxious about surgery.  Objective: Filed Vitals:   12/18/13 1853 12/18/13 1936 12/18/13 2104 12/19/13 0545  BP: 166/96 177/98 207/101 183/92  Pulse:  80  83  Temp:  97.7 F (36.5 C)  98.4 F (36.9 C)  TempSrc:  Oral  Oral  Resp:  18  18  Height:  6\' 4"  (1.93 m)    Weight:  106.595 kg (235 lb)    SpO2:  100%  100%    Intake/Output Summary (Last 24 hours) at 12/19/13 1339 Last data filed at 12/19/13 0903  Gross per 24 hour  Intake 1193.75 ml  Output      0 ml  Net 1193.75 ml   Filed Weights   12/18/13 1102 12/18/13 1936  Weight: 106.595 kg (235 lb) 106.595 kg (235 lb)    Exam:   General:  AA Ox3  Cardiovascular: RRR  Respiratory: CTA B  Abdomen: S/NT/ND/+BS  Extremities: Right foot wrapped, but great toe is visible and is gangrenous.   Neurologic:  Non-focal.  Data Reviewed: Basic Metabolic Panel:  Recent Labs Lab  12/18/13 1116 12/18/13 2100 12/19/13 0424  NA 141 140 139  K 4.8 4.3 4.4  CL 105 105 106  CO2 20 21 22   GLUCOSE 184* 227* 179*  BUN 43* 45* 43*  CREATININE 2.68* 2.62* 2.65*  CALCIUM 8.8 8.3* 8.4  MG  --  1.6  --   PHOS  --  3.5  --    Liver Function Tests:  Recent Labs Lab 12/18/13 2100 12/19/13 0424  AST 14 14  ALT 16 15  ALKPHOS 82 87  BILITOT <0.2* <0.2*  PROT 7.2 6.7  ALBUMIN 2.4* 2.1*   No results found for this basename: LIPASE, AMYLASE,  in the last 168 hours No results found for this basename: AMMONIA,  in the last 168 hours CBC:  Recent Labs Lab 12/18/13 1116 12/18/13 2100 12/19/13 0424  WBC 13.0* 13.7* 11.4*  NEUTROABS  --  9.9*  --   HGB 10.2* 8.5* 8.8*  HCT 31.9* 27.1* 28.4*  MCV 73.5* 73.6* 73.6*  PLT 246 269 253   Cardiac Enzymes: No results found for this basename: CKTOTAL, CKMB, CKMBINDEX, TROPONINI,  in the last 168 hours BNP (last 3 results) No results found for this basename: PROBNP,  in the last 8760 hours CBG:  Recent Labs Lab 12/17/13 1055 12/17/13 1253 12/18/13 2203 12/19/13 0905 12/19/13 1206  GLUCAP 203* 168* 209* 160* 151*    No results found for this or any previous  visit (from the past 240 hour(s)).   Studies: Mr Foot Right Wo Contrast  12/19/2013   CLINICAL DATA:  Diabetic patient with an ulcer on the right great toe.  EXAM: MRI OF THE RIGHT FOREFOOT WITHOUT CONTRAST  TECHNIQUE: Multiplanar, multisequence MR imaging was performed. No intravenous contrast was administered.  COMPARISON:  Plain films 06/25/2013, 08/20/2013 and 12/18/2013.  FINDINGS: The patient is status post amputation of the second toe and third ray at the level of the head of the third metatarsal. Extensive soft tissue edema is present about the foot, worst about the great toe. There is a focal fluid collection in the dorsal subcutaneous tissues measuring approximately 1.1 cm craniocaudal by 2.6 cm transverse by 4.5 cm long. The collection is centered  between the heads of the second and third metatarsals and extending distally along the great toe. There also appears to be circumferential abscess formation about the great toe although lack of IV contrast somewhat limits evaluation. Skin ulceration is seen along the dorsal in medial margin of the great toe at the level of the head of the proximal phalanx.  There is edema throughout the proximal phalanx of the great toe with corresponding decreased T1 signal consistent with osteomyelitis. Markedly decreased T1 signal with areas of decreased T2 signal also seen in the distal phalanx of the great toe consistent with chronic osteomyelitis and osteonecrosis. No other evidence of osteomyelitis is identified. Well-marginated erosions in the head of the first metatarsal in appearance most compatible with changes from gout. Remote fracture of the head of the second metatarsal is noted.  IMPRESSION: Cellulitis about the great toe with changes of osteomyelitis throughout the proximal and distal phalanges. There is likely osteonecrosis of the distal phalanx.  Extensive abscess formation about the great toe extends proximally in the dorsal soft tissues between the distal first and second metatarsals as described above.  Erosions in the head of the first metatarsal have an appearance most suggestive of the sequelae of gout.  Status post amputation of the second toe and third ray at the level of the distal metatarsal.   Electronically Signed   By: Inge Rise M.D.   On: 12/19/2013 08:21   Dg Foot Complete Right  12/18/2013   CLINICAL DATA:  Toe pain and wounds.  EXAM: RIGHT FOOT COMPLETE - 3+ VIEW  COMPARISON:  August 20, 2013.  FINDINGS: Status post surgical amputation of second and third toes. There does appear to be destruction of the distal portion of the first distal phalanx suggesting osteomyelitis. Vascular calcifications are noted.  IMPRESSION: Probable lytic destruction of distal portion of first distal  phalanx is noted suggesting osteomyelitis.   Electronically Signed   By: Sabino Dick M.D.   On: 12/18/2013 16:21    Scheduled Meds: . [START ON 12/20/2013] influenza vac split quadrivalent PF  0.5 mL Intramuscular Tomorrow-1000  . insulin aspart  0-15 Units Subcutaneous TID WC  . insulin aspart  4 Units Subcutaneous TID WC  . insulin NPH Human  20 Units Subcutaneous QHS  . insulin NPH Human  25 Units Subcutaneous QAC breakfast  . piperacillin-tazobactam (ZOSYN)  IV  3.375 g Intravenous Q8H  . vancomycin  1,500 mg Intravenous Q24H   Continuous Infusions:   Principal Problem:   Necrosis of toe Active Problems:   Osteomyelitis   Leukocytosis   Anemia of chronic disease   CKD (chronic kidney disease) stage 4, GFR 15-29 ml/min    Time spent: 35 minutes. Greater than 50% of this time  was spent in direct contact with the patient coordinating care.    Lelon Frohlich  [facility] Pager [phone]  If 7PM-7AM, please contact night-coverage at [email or web address], password [email or web address] 12/19/2013, 1:39 PM  LOS: 1 day

## 2013-12-19 NOTE — Consult Note (Signed)
[place] consulted, however noted in review of chart results of xray.Probable lytic destruction of distal portion of first distal phalanx is noted suggesting osteomyelitis. Treatment for osteomyelitis is considered outside of the scope for the [facility] Nurse will need orthopedic consult which is noted to be pending.  Orthopedics may re-consult if further assistance with wound care needed once surgical interventions or otherwise recommended.    Thanks  Kemuel Buchmann [place], [place] [phone])

## 2013-12-19 NOTE — Progress Notes (Signed)
Recommend adding Novolog SENSITIVE correction scale every 4 hours while NPO and AC and HS when eating.  Will continue to follow while in hospital.  Harvel Ricks RN BSN CDE

## 2013-12-20 DIAGNOSIS — R7309 Other abnormal glucose: Secondary | ICD-10-CM

## 2013-12-20 LAB — GLUCOSE, CAPILLARY
GLUCOSE-CAPILLARY: 209 mg/dL — AB (ref 70–99)
Glucose-Capillary: 94 mg/dL (ref 70–99)
Glucose-Capillary: 158 mg/dL — ABNORMAL HIGH (ref 70–99)
Glucose-Capillary: 83 mg/dL (ref 70–99)

## 2013-12-20 MED ORDER — VANCOMYCIN HCL 10 G IV SOLR
1500.0000 mg | INTRAVENOUS | Status: DC
  Filled 2013-12-20: qty 1500

## 2013-12-20 MED ORDER — VANCOMYCIN HCL 10 G IV SOLR
1500.0000 mg | INTRAVENOUS | Status: DC
  Administered 2013-12-20 – 2013-12-22 (×4): 1500 mg via INTRAVENOUS
  Filled 2013-12-20 (×4): qty 1500

## 2013-12-20 MED ORDER — DEXTROSE 5 % IV SOLN
1.0000 g | INTRAVENOUS | Status: DC
  Administered 2013-12-20 – 2013-12-21 (×2): 1 g via INTRAVENOUS
  Filled 2013-12-20 (×3): qty 1

## 2013-12-20 MED ORDER — CYCLOBENZAPRINE HCL 10 MG PO TABS
10.0000 mg | ORAL_TABLET | Freq: Three times a day (TID) | ORAL | Status: DC | PRN
  Administered 2013-12-20 – 2013-12-22 (×5): 10 mg via ORAL
  Filled 2013-12-20 (×5): qty 1

## 2013-12-20 NOTE — Progress Notes (Deleted)
12/20/13 0811 nsg Refuses bed alarm; wife in room.

## 2013-12-20 NOTE — Progress Notes (Signed)
Pt c/o pain medicated

## 2013-12-20 NOTE — Progress Notes (Addendum)
ANTIBIOTIC CONSULT NOTE - INITIAL  Pharmacy Consult for Cefepime, follow up zosyn Indication: Osteomyelitis (s/p Rt necrotic toe amputation)  No Known Allergies  Patient Measurements: Height: 6\' 4"  (193 cm) Weight: 235 lb (106.595 kg) IBW/kg (Calculated) : 86.8   Vital Signs: Temp: 98.5 F (36.9 C) (01/17 0806) Temp src: Oral (01/17 0806) BP: 178/81 mmHg (01/17 0806) Pulse Rate: 103 (01/17 0806) Intake/Output from previous day: 01/16 0701 - 01/17 0700 In: 2631.7 [P.O.:600; I.V.:1381.7; IV Piggyback:650] Out: -  Intake/Output from this shift: Total I/O In: 240 [P.O.:240] Out: -   Labs:  Recent Labs  12/18/13 1116 12/18/13 2100 12/19/13 0424  WBC 13.0* 13.7* 11.4*  HGB 10.2* 8.5* 8.8*  PLT 246 269 253  CREATININE 2.68* 2.62* 2.65*   Estimated Creatinine Clearance: 42.7 ml/min (by C-G formula based on Cr of 2.65). No results found for this basename: VANCOTROUGH, Corlis Leak, VANCORANDOM, GENTTROUGH, GENTPEAK, GENTRANDOM, TOBRATROUGH, TOBRAPEAK, TOBRARND, AMIKACINPEAK, AMIKACINTROU, AMIKACIN,  in the last 72 hours   Microbiology: Recent Results (from the past 720 hour(s))  SURGICAL PCR SCREEN     Status: None   Collection Time    12/19/13  4:24 PM      Result Value Range Status   MRSA, PCR NEGATIVE  NEGATIVE Final   Staphylococcus aureus NEGATIVE  NEGATIVE Final   Comment:            The Xpert SA Assay (FDA     approved for NASAL specimens     in patients over 55 years of age),     is one component of     a comprehensive surveillance     program.  Test performance has     been validated by [employer] for patients greater     than or equal to 43 year old.     It is not intended     to diagnose infection nor to     guide or monitor treatment.    Medical History: Past Medical History  Diagnosis Date  . GERD (gastroesophageal reflux disease)   . HTN (hypertension)   . Diabetes mellitus     Medications:  Scheduled:  . docusate sodium  100 mg Oral  BID  . enoxaparin (LOVENOX) injection  30 mg Subcutaneous Q24H  . influenza vac split quadrivalent PF  0.5 mL Intramuscular Tomorrow-1000  . insulin aspart  0-15 Units Subcutaneous TID WC  . insulin aspart  4 Units Subcutaneous TID WC  . insulin NPH Human  20 Units Subcutaneous QHS  . insulin NPH Human  25 Units Subcutaneous QAC breakfast  . piperacillin-tazobactam (ZOSYN)  IV  3.375 g Intravenous Q8H  . senna  2 tablet Oral BID   Assessment: 53 year old male with PMH of hypertension, diabetes with complication of previous toe amputation and poor wound heeling who presented to [facility] ED 12/18/2013 with complaints of poor ulcer healing on the right great toe. Patient was on doxycycline prior to admission. Wound has gotten progressively worse, concern for osteomyelitis.  Pt has received Vanc and Zosyn, and now transitioned to Zosyn and Cefepime.  Pt is afebrile, and wbc has decreased to 11.4.  Pt also has hx of CKD 4 with baseline Scr of 2.4.  Current Scr is 2.65 with Crcl ~42 ml/min.    Goal of Therapy:  Resolution of infection  Plan:  Continue Zosyn 3.375g IV q 8 hours - infuse each dose over 4 hours  Start Cefepime 1g every 24 hours Monitor Scr,  Crcl, WBC, fever curve  Jeronimo Norma, PharmD Clinical Pharmacist Resident Pager: [phone]  Jeronimo Norma 12/20/2013,11:13 AM   ADDENDUM 12/20/12 at 1411  MD incorrectly d/c the Vanc instead of the Zosyn.  Patient should be on Vanc and Cefepime.  Plan: 1. Add back Vanc 2. Stop Zosyn  Jeronimo Norma, PharmD Clinical Pharmacist Resident Pager: [phone]

## 2013-12-20 NOTE — Progress Notes (Signed)
Subjective: 1 Day Post-Op Procedure(s) (LRB): TRANSMETATARSAL AMPUTATION RIGHT FOOT WITH INTRAOPERATIVE PERCUTANEOUS HEEL CORD LENGTHENING  (Right) Patient reports pain as mild.  Controlled with oral pain meds.  Objective: Vital signs in last 24 hours: Temp:  [97.9 F (36.6 C)-98.7 F (37.1 C)] 98.5 F (36.9 C) (01/17 0806) Pulse Rate:  [72-103] 103 (01/17 0806) Resp:  [11-20] 20 (01/17 0806) BP: (167-187)/(81-95) 178/81 mmHg (01/17 0806) SpO2:  [99 %-100 %] 100 % (01/17 0806)  Intake/Output from previous day: 01/16 0701 - 01/17 0700 In: 2631.7 [P.O.:600; I.V.:1381.7; IV Piggyback:650] Out: -  Intake/Output this shift:     Recent Labs  12/18/13 1116 12/18/13 2100 12/19/13 0424  HGB 10.2* 8.5* 8.8*    Recent Labs  12/18/13 2100 12/19/13 0424  WBC 13.7* 11.4*  RBC 3.68* 3.86*  HCT 27.1* 28.4*  PLT 269 253    Recent Labs  12/18/13 2100 12/19/13 0424  NA 140 139  K 4.3 4.4  CL 105 106  CO2 21 22  BUN 45* 43*  CREATININE 2.62* 2.65*  GLUCOSE 227* 179*  CALCIUM 8.3* 8.4    Recent Labs  12/18/13 2100  INR 1.09   Cultures from the OR pending.  PE:  R LE dressed and dry.  Splint intact.    Assessment/Plan: 1 Day Post-Op Procedure(s) (LRB): TRANSMETATARSAL AMPUTATION RIGHT FOOT WITH INTRAOPERATIVE PERCUTANEOUS HEEL CORD LENGTHENING  (Right) Up with therapy  NWB on the R LE.  I anticipate the pt will require IV abx upon discharge for at least two weeks since the amputation margin was immediately adjacent to an area of infection.  Cultures are pending from the OR and will be used to tailor abx.  In the meantime continue IV vanc and zosyn.  [name] 12/20/2013, 8:51 AM

## 2013-12-20 NOTE — Op Note (Signed)
NAMEDECKLYN, HAINS              ACCOUNT NO.:  [id number]  MEDICAL RECORD NO.:  [id number]  LOCATION:  6E08C                        FACILITY:  [facility]  PHYSICIAN:  [name], MD        DATE OF BIRTH:  10-Apr-1961  DATE OF PROCEDURE:  12/19/2013 DATE OF DISCHARGE:                              OPERATIVE REPORT   PREOPERATIVE DIAGNOSIS:  Right forefoot gangrene.  POSTOPERATIVE DIAGNOSIS:  Right forefoot gangrene.  PROCEDURE: 1. Right foot transmetatarsal amputation. 2. Right percutaneous heel cord lengthening through separate     incisions.  SURGEON:  [name], MD  ANESTHESIA:  General.  ESTIMATED BLOOD LOSS:  Minimal.  TOURNIQUET TIME:  32 minutes 250 mmHg.  COMPLICATIONS:  None apparent.  DISPOSITION:  Extubated, awake, and stable to recovery.  INDICATIONS FOR PROCEDURE:  The patient is a 53 year old male with past medical history significant for recurrent diabetic forefoot ulcers.  He has been treated at the [facility] now for several months with hyperbaric oxygen.  Over the last few days, he noticed progressive necrosis of the forefoot and was sent to the emergency room.  He is noted to have gangrene of the medial forefoot with previous amputation of the central toes.  He presents now for an attempt at transmetatarsal amputation.  He will require percutaneous heel cord lengthening as well. I have informed him that this may not heal and he may ultimately require below-knee amputation.  He understands the risks and benefits, the alternative treatment options and elects surgical treatment.  He specifically understands risks of bleeding, infection, nerve damage, blood clots, need for additional surgery, amputation, and death.  PROCEDURE IN DETAIL:  After preoperative consent was obtained and the correct operative site was identified, the patient was brought to the operating room and placed supine on the operating table.  General anesthesia was induced.  The patient  was already on therapeutic antibiotics.  Surgical time-out was taken.  The right lower extremity was prepped and draped in standard sterile fashion with tourniquet around the thigh.  The extremity was exsanguinated.  The tourniquet was inflated to 250 mmHg.  A triple hemisection, percutaneous heel cord lengthening was then performed.  The ankle was then dorsiflexed approximately 30 degrees with the knee extended.  Attention was then turned to the forefoot where a fishmouth incision was marked around the metatarsals.  Incision was made.  Sharp dissection was carried down through the skin and subcutaneous tissue.  There was purulence tracking along the extensor hallucis longus tendon sheath. Subperiosteal dissection was then carried along the dorsal metatarsals elevating a dorsal flap.  An oscillating saw was then used to cut through the metatarsals.  The plantar soft tissues were cut and beveled appropriately.  The forefoot was passed off the field as a specimen to Pathology.  The purulent material in the extensor hallucis longus tendon was all sharply debrided with a 15 blade.  Specimens were sent to Microbiology for aerobic and anaerobic culture.  The wound was then irrigated copiously with 2 L of normal saline.  The remaining tissue all appeared to be generally healthy and viable.  No remaining purulence was noted.  The cut surfaces of bone were beveled  with the saw and smoothed appropriately.  The incision was closed with horizontal mattress sutures of 2-0 nylon.  Sterile dressings were applied.  The neurovascular bundles had been cauterized prior to closure.  The wound was dressed sterilely, and compression wrap was applied followed by well-padded short-leg splint.  The tourniquet was released at 32 minutes.  After application of the dressings, the patient was awakened by Anesthesia and transported to the recovery room in stable condition.  FOLLOWUP PLAN:  The patient will be  maintained on IV antibiotics pending culture results.  He will be nonweightbearing on the right lower extremity and have Physical Therapy and Occupational Therapy consults.     [name], MD     JH/MEDQ  D:  12/19/2013  T:  12/20/2013  Job:  [id number]

## 2013-12-20 NOTE — Evaluation (Signed)
Physical Therapy Evaluation Patient Details Name: [name] MRN: [id number] DOB: May 06, 1961 Today's Date: 12/20/2013 Time: [id number] PT Time Calculation (min): 25 min  PT Assessment / Plan / Recommendation History of Present Illness  Patient is a 53 y/o male admitted with right great toe infection, 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  Clinical Impression  Patient presents with decreased independence with mobility secondary to deficits listed below.  He will benefit from skilled PT to assist with d/c home with wife to assist and HHPT.   PT Assessment  Patient needs continued PT services    Follow Up Recommendations  Home health PT;Supervision for mobility/OOB    Does the patient have the potential to tolerate intense rehabilitation    N/A  Barriers to Discharge  None      Equipment Recommendations  Rolling walker with 5" wheels (versus knee walker, patient to decide)    Recommendations for Other Services     Frequency Min 5X/week    Precautions / Restrictions Precautions Precautions: Fall Restrictions Weight Bearing Restrictions: Yes RLE Weight Bearing: Non weight bearing   Pertinent Vitals/Pain Reports just had pain meds and pain controlled currently      Mobility  Bed Mobility Overal bed mobility: Modified Independent Transfers Overall transfer level: Needs assistance Transfers: Sit to/from Stand Sit to Stand: Min guard General transfer comment: cues for hand placement on bed Ambulation/Gait Ambulation/Gait assistance: Min assist Ambulation Distance (Feet): 20 Feet Assistive device: Rolling walker (2 wheeled) Gait Pattern/deviations: Step-to pattern;Decreased stride length General Gait Details: loss of balance laterally at times with turns with min assist recovery; walked to/from bathroom, assist to negotiate around obstacles in the room    Exercises     PT Diagnosis: Difficulty walking;Acute pain  PT Problem List: Decreased  knowledge of use of DME;Decreased activity tolerance;Decreased balance;Decreased mobility;Decreased knowledge of precautions;Decreased safety awareness PT Treatment Interventions: DME instruction;Balance training;Gait training;Stair training;Functional mobility training;Patient/family education;Therapeutic activities;Therapeutic exercise     PT Goals(Current goals can be found in the care plan section) Acute Rehab PT Goals Patient Stated Goal: To return to independent/work PT Goal Formulation: With patient Time For Goal Achievement: 01/03/14 Potential to Achieve Goals: Good  Visit Information  Last PT Received On: 12/20/13 Assistance Needed: +1 History of Present Illness: Patient is a 53 y/o male admitted with right great toe infection, 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       Prior [place] expects to be discharged to:: Private residence Living Arrangements: Spouse/significant other Available Help at Discharge: Family Type of Home: House Home Access: Stairs to enter Technical brewer of Steps: 3-4 Entrance Stairs-Rails: Right;Left Home Layout: One level Home Equipment: Other (comment) (camboots and diabetic shoes) Additional Comments: lives with roomates now (separated from wife, but states she will be available to help after d/c) Prior Function Level of Independence: Independent Communication Communication: No difficulties    Cognition  Cognition Arousal/Alertness: Awake/alert Behavior During Therapy: WFL for tasks assessed/performed Overall Cognitive Status: Within Functional Limits for tasks assessed    Extremity/Trunk Assessment Upper Extremity Assessment Upper Extremity Assessment: Overall WFL for tasks assessed Lower Extremity Assessment Lower Extremity Assessment: Overall WFL for tasks assessed   Balance Balance Overall balance assessment: Needs assistance Sitting balance-Leahy Scale: Good Standing  balance support: Bilateral upper extremity supported Standing balance-Leahy Scale: Poor Standing balance comment: NWB right LE and needs bilateral UE assist for balance  End of Session PT - End of Session Equipment Utilized During Treatment: Gait belt Activity  Tolerance: Patient tolerated treatment well Patient left: in chair;with call bell/phone within reach  GP     [facility] 12/20/2013, 9:50 AM Magda Kiel, [place] 12/20/2013

## 2013-12-20 NOTE — Progress Notes (Signed)
[facility] PROGRESS NOTE  Prashant Pelly [id number] DOB: 11-Apr-1961 DOA: 12/18/2013 PCP: Leonard Downing, MD  Assessment/Plan: Osteomyelitis and Necrosis of Right Great Toe -Has been seen by ortho with plans for amputation. -MRI with Extensive abscess formation about the great toe extends proximally  in the dorsal soft tissues between the distal first and second  metatarsals in addition to osteomyelitis of great toe. -Discussed with Dr. Tommy Medal via phone: plan for PICC insertion and 2 weeks of vanc/cefepime.   DM -Continue NPH and SSI. -Fairly well controlled.  CKD Stage IV -At baseline. -Recheck renal function in am.  HTN -BP remains elevated. -Lisinopril on hold given renal insufficiency. -Will order IV PRN hydralazine.   Code Status: Full COde Family Communication: Wife at bedside  Disposition Plan: To be determined; likely home in about 48 hours.   Consultants:  Karie Soda   Antibiotics:  Vanc  Cefepime  Subjective: No complaints.  Has been up.  Objective: Filed Vitals:   12/19/13 2152 12/20/13 0518 12/20/13 0806 12/20/13 1412  BP: 184/90 171/88 178/81 182/85  Pulse: 72 80 103 110  Temp: 98.2 F (36.8 C) 98.7 F (37.1 C) 98.5 F (36.9 C) 98.3 F (36.8 C)  TempSrc: Oral Oral Oral Oral  Resp: 16 16 20 18   Height:      Weight:      SpO2: 99% 100% 100% 100%    Intake/Output Summary (Last 24 hours) at 12/20/13 1719 Last data filed at 12/20/13 1441  Gross per 24 hour  Intake 3061.67 ml  Output      0 ml  Net 3061.67 ml   Filed Weights   12/18/13 1102 12/18/13 1936  Weight: 106.595 kg (235 lb) 106.595 kg (235 lb)    Exam:   General:  AA Ox3  Cardiovascular: RRR  Respiratory: CTA B  Abdomen: S/NT/ND/+BS  Extremities: Right foot wrapped.   Neurologic:  Non-focal.  Data Reviewed: Basic Metabolic Panel:  Recent Labs Lab 12/18/13 1116 12/18/13 2100 12/19/13 0424  NA 141 140 139  K 4.8 4.3 4.4  CL 105 105  106  CO2 20 21 22   GLUCOSE 184* 227* 179*  BUN 43* 45* 43*  CREATININE 2.68* 2.62* 2.65*  CALCIUM 8.8 8.3* 8.4  MG  --  1.6  --   PHOS  --  3.5  --    Liver Function Tests:  Recent Labs Lab 12/18/13 2100 12/19/13 0424  AST 14 14  ALT 16 15  ALKPHOS 82 87  BILITOT <0.2* <0.2*  PROT 7.2 6.7  ALBUMIN 2.4* 2.1*   No results found for this basename: LIPASE, AMYLASE,  in the last 168 hours No results found for this basename: AMMONIA,  in the last 168 hours CBC:  Recent Labs Lab 12/18/13 1116 12/18/13 2100 12/19/13 0424  WBC 13.0* 13.7* 11.4*  NEUTROABS  --  9.9*  --   HGB 10.2* 8.5* 8.8*  HCT 31.9* 27.1* 28.4*  MCV 73.5* 73.6* 73.6*  PLT 246 269 253   Cardiac Enzymes: No results found for this basename: CKTOTAL, CKMB, CKMBINDEX, TROPONINI,  in the last 168 hours BNP (last 3 results) No results found for this basename: PROBNP,  in the last 8760 hours CBG:  Recent Labs Lab 12/19/13 1933 12/19/13 2144 12/20/13 0757 12/20/13 1137 12/20/13 1649  GLUCAP 104* 166* 209* 94 158*    Recent Results (from the past 240 hour(s))  SURGICAL PCR SCREEN     Status: None   Collection Time    12/19/13  4:24 PM      Result Value Range Status   MRSA, PCR NEGATIVE  NEGATIVE Final   Staphylococcus aureus NEGATIVE  NEGATIVE Final   Comment:            The Xpert SA Assay (FDA     approved for NASAL specimens     in patients over 22 years of age),     is one component of     a comprehensive surveillance     program.  Test performance has     been validated by [employer] for patients greater     than or equal to 60 year old.     It is not intended     to diagnose infection nor to     guide or monitor treatment.  ANAEROBIC CULTURE     Status: None   Collection Time    12/19/13  6:52 PM      Result Value Range Status   Specimen Description ABSCESS RIGHT FOOT   Final   Special Requests NONE   Final   Gram Stain     Final   Value: ABUNDANT WBC PRESENT,BOTH PMN AND  MONONUCLEAR     NO SQUAMOUS EPITHELIAL CELLS SEEN     MODERATE GRAM POSITIVE COCCI IN PAIRS     Performed at [facility]   Culture     Final   Value: NO ANAEROBES ISOLATED; CULTURE IN PROGRESS FOR 5 DAYS     Performed at [facility]   Report Status PENDING   Incomplete  CULTURE, ROUTINE-ABSCESS     Status: None   Collection Time    12/19/13  6:52 PM      Result Value Range Status   Specimen Description ABSCESS RIGHT FOOT   Final   Special Requests NONE   Final   Gram Stain     Final   Value: ABUNDANT WBC PRESENT,BOTH PMN AND MONONUCLEAR     NO SQUAMOUS EPITHELIAL CELLS SEEN     MODERATE GRAM POSITIVE COCCI IN PAIRS     Performed at [facility]   Culture PENDING   Incomplete   Report Status PENDING   Incomplete     Studies: Mr Foot Right Wo Contrast  12/19/2013   CLINICAL DATA:  Diabetic patient with an ulcer on the right great toe.  EXAM: MRI OF THE RIGHT FOREFOOT WITHOUT CONTRAST  TECHNIQUE: Multiplanar, multisequence MR imaging was performed. No intravenous contrast was administered.  COMPARISON:  Plain films 06/25/2013, 08/20/2013 and 12/18/2013.  FINDINGS: The patient is status post amputation of the second toe and third ray at the level of the head of the third metatarsal. Extensive soft tissue edema is present about the foot, worst about the great toe. There is a focal fluid collection in the dorsal subcutaneous tissues measuring approximately 1.1 cm craniocaudal by 2.6 cm transverse by 4.5 cm long. The collection is centered between the heads of the second and third metatarsals and extending distally along the great toe. There also appears to be circumferential abscess formation about the great toe although lack of IV contrast somewhat limits evaluation. Skin ulceration is seen along the dorsal in medial margin of the great toe at the level of the head of the proximal phalanx.  There is edema throughout the proximal phalanx of the great toe with  corresponding decreased T1 signal consistent with osteomyelitis. Markedly decreased T1 signal with areas of decreased T2 signal also seen in the  distal phalanx of the great toe consistent with chronic osteomyelitis and osteonecrosis. No other evidence of osteomyelitis is identified. Well-marginated erosions in the head of the first metatarsal in appearance most compatible with changes from gout. Remote fracture of the head of the second metatarsal is noted.  IMPRESSION: Cellulitis about the great toe with changes of osteomyelitis throughout the proximal and distal phalanges. There is likely osteonecrosis of the distal phalanx.  Extensive abscess formation about the great toe extends proximally in the dorsal soft tissues between the distal first and second metatarsals as described above.  Erosions in the head of the first metatarsal have an appearance most suggestive of the sequelae of gout.  Status post amputation of the second toe and third ray at the level of the distal metatarsal.   Electronically Signed   By: Inge Rise M.D.   On: 12/19/2013 08:21    Scheduled Meds: . ceFEPime (MAXIPIME) IV  1 g Intravenous Q24H  . docusate sodium  100 mg Oral BID  . enoxaparin (LOVENOX) injection  30 mg Subcutaneous Q24H  . insulin aspart  0-15 Units Subcutaneous TID WC  . insulin aspart  4 Units Subcutaneous TID WC  . insulin NPH Human  20 Units Subcutaneous QHS  . insulin NPH Human  25 Units Subcutaneous QAC breakfast  . senna  2 tablet Oral BID  . vancomycin  1,500 mg Intravenous Q24H   Continuous Infusions:   Principal Problem:   Necrosis of toe Active Problems:   Osteomyelitis   Leukocytosis   Anemia of chronic disease   CKD (chronic kidney disease) stage 4, GFR 15-29 ml/min    Time spent: 35 minutes. Greater than 50% of this time was spent in direct contact with the patient coordinating care.    Lelon Frohlich  [facility] Pager [phone]  If 7PM-7AM, please contact  night-coverage at [email or web address], password [facility] 12/20/2013, 5:19 PM  LOS: 2 days

## 2013-12-21 LAB — GLUCOSE, CAPILLARY
Glucose-Capillary: 132 mg/dL — ABNORMAL HIGH (ref 70–99)
Glucose-Capillary: 147 mg/dL — ABNORMAL HIGH (ref 70–99)
Glucose-Capillary: 80 mg/dL (ref 70–99)
Glucose-Capillary: 143 mg/dL — ABNORMAL HIGH (ref 70–99)

## 2013-12-21 LAB — CBC
HCT: 28.4 % — ABNORMAL LOW (ref 39.0–52.0)
Hemoglobin: 9 g/dL — ABNORMAL LOW (ref 13.0–17.0)
MCH: 23.2 pg — AB (ref 26.0–34.0)
MCHC: 31.7 g/dL (ref 30.0–36.0)
MCV: 73.2 fL — ABNORMAL LOW (ref 78.0–100.0)
PLATELETS: 277 10*3/uL (ref 150–400)
RBC: 3.88 MIL/uL — AB (ref 4.22–5.81)
RDW: 13.9 % (ref 11.5–15.5)
WBC: 11 10*3/uL — ABNORMAL HIGH (ref 4.0–10.5)

## 2013-12-21 LAB — BASIC METABOLIC PANEL
BUN: 33 mg/dL — AB (ref 6–23)
CALCIUM: 8.4 mg/dL (ref 8.4–10.5)
CO2: 20 mEq/L (ref 19–32)
CREATININE: 2.6 mg/dL — AB (ref 0.50–1.35)
Chloride: 107 mEq/L (ref 96–112)
GFR calc Af Amer: 30 mL/min — ABNORMAL LOW (ref >90)
GFR calc non Af Amer: 26 mL/min — ABNORMAL LOW (ref >90)
GLUCOSE: 117 mg/dL — AB (ref 70–99)
Potassium: 4.4 mEq/L (ref 3.7–5.3)
SODIUM: 141 meq/L (ref 137–147)

## 2013-12-21 NOTE — Progress Notes (Signed)
ANTIBIOTIC CONSULT NOTE - Follow up  Pharmacy Consult for Cefepime and Vanc Indication: Osteomyelitis (s/p Rt necrotic toe amputation)  No Known Allergies  Patient Measurements: Height: 6\' 4"  (193 cm) Weight: 243 lb (110.224 kg) IBW/kg (Calculated) : 86.8   Vital Signs: Temp: 98.1 F (36.7 C) (01/18 0750) Temp src: Oral (01/18 0750) BP: 125/79 mmHg (01/18 0750) Pulse Rate: 79 (01/18 0750) Intake/Output from previous day: 01/17 0701 - 01/18 0700 In: 1750 [P.O.:1200; IV Piggyback:550] Out: 625 [Urine:625] Intake/Output from this shift: Total I/O In: 240 [P.O.:240] Out: 925 [Urine:925]  Labs:  Recent Labs  12/18/13 2100 12/19/13 0424 12/21/13 0707  WBC 13.7* 11.4* 11.0*  HGB 8.5* 8.8* 9.0*  PLT 269 253 277  CREATININE 2.62* 2.65* 2.60*   Estimated Creatinine Clearance: 44.2 ml/min (by C-G formula based on Cr of 2.6). No results found for this basename: VANCOTROUGH, Corlis Leak, VANCORANDOM, GENTTROUGH, GENTPEAK, GENTRANDOM, TOBRATROUGH, TOBRAPEAK, TOBRARND, AMIKACINPEAK, AMIKACINTROU, AMIKACIN,  in the last 72 hours   Microbiology: Recent Results (from the past 720 hour(s))  SURGICAL PCR SCREEN     Status: None   Collection Time    12/19/13  4:24 PM      Result Value Range Status   MRSA, PCR NEGATIVE  NEGATIVE Final   Staphylococcus aureus NEGATIVE  NEGATIVE Final   Comment:            The Xpert SA Assay (FDA     approved for NASAL specimens     in patients over 55 years of age),     is one component of     a comprehensive surveillance     program.  Test performance has     been validated by [employer] for patients greater     than or equal to 55 year old.     It is not intended     to diagnose infection nor to     guide or monitor treatment.  ANAEROBIC CULTURE     Status: None   Collection Time    12/19/13  6:52 PM      Result Value Range Status   Specimen Description ABSCESS RIGHT FOOT   Final   Special Requests NONE   Final   Gram Stain      Final   Value: ABUNDANT WBC PRESENT,BOTH PMN AND MONONUCLEAR     NO SQUAMOUS EPITHELIAL CELLS SEEN     MODERATE GRAM POSITIVE COCCI IN PAIRS     Performed at [facility]   Culture     Final   Value: NO ANAEROBES ISOLATED; CULTURE IN PROGRESS FOR 5 DAYS     Performed at [facility]   Report Status PENDING   Incomplete  CULTURE, ROUTINE-ABSCESS     Status: None   Collection Time    12/19/13  6:52 PM      Result Value Range Status   Specimen Description ABSCESS RIGHT FOOT   Final   Special Requests NONE   Final   Gram Stain     Final   Value: ABUNDANT WBC PRESENT,BOTH PMN AND MONONUCLEAR     NO SQUAMOUS EPITHELIAL CELLS SEEN     MODERATE GRAM POSITIVE COCCI IN PAIRS     Performed at [facility]   Culture     Final   Value: Culture reincubated for better growth     Performed at [facility]   Report Status PENDING   Incomplete    Medical History: Past  Medical History  Diagnosis Date  . GERD (gastroesophageal reflux disease)   . HTN (hypertension)   . Diabetes mellitus     Medications:  Scheduled:  . ceFEPime (MAXIPIME) IV  1 g Intravenous Q24H  . docusate sodium  100 mg Oral BID  . enoxaparin (LOVENOX) injection  30 mg Subcutaneous Q24H  . insulin aspart  0-15 Units Subcutaneous TID WC  . insulin aspart  4 Units Subcutaneous TID WC  . insulin NPH Human  20 Units Subcutaneous QHS  . insulin NPH Human  25 Units Subcutaneous QAC breakfast  . senna  2 tablet Oral BID  . vancomycin  1,500 mg Intravenous Q24H   Assessment: 53 year old male with PMH of hypertension, diabetes with complication of previous toe amputation and poor wound heeling who presented to [facility] ED 12/18/2013 with complaints of poor ulcer healing on the right great toe. Patient was on doxycycline prior to admission. Wound has gotten progressively worse, concern for osteomyelitis.  Pt has received Vanc and Zosyn, and now transitioned to Vanc and Cefepime (Vanc was incorrectly d/c  instead of zosyn, but Vanc restarted yesterday).  Pt is afebrile, and wbc has decreased to 11.0 with cultures pending.  Pt also has hx of CKD 4 with baseline Scr of 2.4.  Current Scr is 2.60 with Crcl ~44 ml/min.    Goal of Therapy:  Resolution of infection  Plan:  Continue Cefepime 1g every 24 hours Continue Vanc 1500 mg q24h Monitor Scr, Crcl, WBC, fever curve, and VT as appropriate at [place], PharmD Clinical Pharmacist Resident Pager: [phone]  Jeronimo Norma 12/21/2013,10:59 AM

## 2013-12-21 NOTE — Progress Notes (Signed)
Physical Therapy Treatment Patient Details Name: [name] MRN: [id number] DOB: 06-21-1961 Today's Date: 12/21/2013 Time: [id number] PT Time Calculation (min): 26 min  PT Assessment / Plan / Recommendation  History of Present Illness Patient is a 53 y/o male admitted with right great toe infection, 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   PT Comments   Pt progressing but limited by fatigue. Pt encouraged to be OOB, continue HEP and increase gait. Pt to contact wife to confirm stair setup at home. Pt continuing to do well with maintaining NWB status. Will continue to follow.   Follow Up Recommendations  Home health PT;Supervision for mobility/OOB     Does the patient have the potential to tolerate intense rehabilitation     Barriers to Discharge        Equipment Recommendations  Rolling walker with 5" wheels    Recommendations for Other Services    Frequency Min 5X/week   Progress towards PT Goals Progress towards PT goals: Progressing toward goals  Plan Current plan remains appropriate    Precautions / Restrictions Precautions Precautions: Fall Restrictions Weight Bearing Restrictions: Yes RLE Weight Bearing: Non weight bearing   Pertinent Vitals/Pain No pain, premedicated    Mobility  Bed Mobility Overal bed mobility: Modified Independent Transfers Overall transfer level: Needs assistance Sit to Stand: Supervision General transfer comment: cues for hand placement from bed and chair Ambulation/Gait Ambulation/Gait assistance: Min guard Ambulation Distance (Feet): 30 Feet Assistive device: Rolling walker (2 wheeled) Gait Pattern/deviations: Step-to pattern;Decreased stride length General Gait Details: pt without LOB today with slow gait with hopping, fatigue limiting distance Stairs: Yes Stairs assistance: Min guard Stair Management: Two rails;Backwards;Step to pattern;With walker Number of Stairs: 2 General stair comments: pt ascended  first step with RW then too fearful to try once RW uneven and used bil rails for 2nd step. pt states rails at home too wide to reach both and they are 4" steps    Exercises General Exercises - Lower Extremity Long Arc Quad: AROM;Seated;Both;20 reps Hip Flexion/Marching: AROM;Seated;Both;20 reps   PT Diagnosis:    PT Problem List:   PT Treatment Interventions:     PT Goals (current goals can now be found in the care plan section)    Visit Information  Last PT Received On: 12/21/13 Assistance Needed: +1 History of Present Illness: Patient is a 53 y/o male admitted with right great toe infection, 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    Subjective Data      Cognition  Cognition Arousal/Alertness: Awake/alert Behavior During Therapy: WFL for tasks assessed/performed Overall Cognitive Status: Within Functional Limits for tasks assessed    Balance     End of Session PT - End of Session Equipment Utilized During Treatment: Gait belt Activity Tolerance: Patient tolerated treatment well Patient left: in chair;with call bell/phone within reach Nurse Communication: Mobility status   GP     Melford Aase 12/21/2013, 12:14 PM Elwyn Reach, [place]

## 2013-12-21 NOTE — Evaluation (Signed)
Occupational Therapy Evaluation Patient Details Name: [name] MRN: [id number] DOB: 07-Nov-1961 Today's Date: 12/21/2013 Time: [id number] OT Time Calculation (min): 18 min  OT Assessment / Plan / Recommendation History of present illness Patient is a 53 y/o male admitted with right great toe infection, 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   Clinical Impression   Pt presents with below problem list. Pt independent with ADLs, PTA. Feel pt will benefit from acute OT to increase independence prior to d/c.     OT Assessment  Patient needs continued OT Services    Follow Up Recommendations  No OT follow up;Supervision - Intermittent (when OOB/mobility)    Barriers to Discharge      Equipment Recommendations  3 in 1 bedside comode    Recommendations for Other Services    Frequency  Min 2X/week    Precautions / Restrictions Precautions Precautions: Fall Restrictions Weight Bearing Restrictions: Yes RLE Weight Bearing: Non weight bearing   Pertinent Vitals/Pain Pain 6-7/10 in RLE. Increased activity. Pt had RLE elevated at end of session.     ADL  Grooming: Wash/dry face;Min guard Where Assessed - Grooming: Supported standing Upper Body Dressing: Set up Where Assessed - Upper Body Dressing: Supported sitting Lower Body Dressing: Min guard Where Assessed - Lower Body Dressing: Supported sit to Lobbyist: Magazine features editor Method: Sit to Loss adjuster, chartered: Regular height toilet;Grab bars Tub/Shower Transfer Method: Not assessed Equipment Used: Gait belt;Rolling walker Transfers/Ambulation Related to ADLs: Min guard ADL Comments: Educated on safe shoe wear and use of bag on walker to carry items. Educated on dressing technique and recommended standing in front of chair/bed with walker in front when pulling up LB clothing. Educated on tub transfer techniques. Had pt perform 10 reps of chair pushups and explained  increasing strength in triceps will help with transfers.  Recommended sitting on chair for bathing. Spoke with pt about having chair behind him at sink when grooming.    OT Diagnosis: Acute pain  OT Problem List: Decreased strength;Decreased activity tolerance;Impaired balance (sitting and/or standing);Decreased knowledge of use of DME or AE;Pain;Decreased knowledge of precautions OT Treatment Interventions: Self-care/ADL training;Therapeutic exercise;DME and/or AE instruction;Therapeutic activities;Patient/family education;Balance training   OT Goals(Current goals can be found in the care plan section) Acute Rehab OT Goals Patient Stated Goal: not stated OT Goal Formulation: With patient Time For Goal Achievement: 12/28/13 Potential to Achieve Goals: Good ADL Goals Pt Will Transfer to Toilet: with modified independence;ambulating (3 in 1 over commode) Pt Will Perform Toileting - Clothing Manipulation and hygiene: with modified independence;sit to/from stand Pt Will Perform Tub/Shower Transfer: Tub transfer;with supervision;3 in 1;rolling walker Additional ADL Goal #1: Pt will independently perform UE exercises to increase strength.   Visit Information  Last OT Received On: 12/21/13 Assistance Needed: +1 History of Present Illness: Patient is a 53 y/o male admitted with right great toe infection, 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       Prior [place] expects to be discharged to:: Private residence Living Arrangements: Spouse/significant other Available Help at Discharge: Family Type of Home: House Home Access: Stairs to enter Technical brewer of Steps: 3-4 Entrance Stairs-Rails: Right;Left Home Layout: One level Home Equipment:  (has camboots and diabetic shoes) Additional Comments: lives with roomates now (separated from wife, but states she will be available to help after d/c) Prior Function Level of  Independence: Independent Communication Communication: No difficulties  Vision/Perception Vision - History Baseline Vision: Wears glasses only for reading   Cognition  Cognition Arousal/Alertness: Awake/alert Behavior During Therapy: WFL for tasks assessed/performed Overall Cognitive Status: Within Functional Limits for tasks assessed    Extremity/Trunk Assessment Upper Extremity Assessment Upper Extremity Assessment: Generalized weakness (arms fatigued) Lower Extremity Assessment Lower Extremity Assessment: Defer to PT evaluation     Mobility Bed Mobility Overal bed mobility: Needs Assistance Bed Mobility: Sit to Sidelying;Rolling Rolling: Supervision Sit to sidelying: Supervision General bed mobility comments: OT educated on log rolling technique as pt c/o back pain. Transfers Overall transfer level: Needs assistance Equipment used: Rolling walker (2 wheeled) Transfers: Sit to/from Stand Sit to Stand: Min guard General transfer comment: cues for technique.           End of Session OT - End of Session Equipment Utilized During Treatment: Gait belt;Rolling walker Activity Tolerance: Patient limited by fatigue Patient left: in bed;with call bell/phone within reach  GO     Benito Mccreedy OTR/L [id number] 12/21/2013, 1:45 PM

## 2013-12-21 NOTE — Progress Notes (Signed)
    Subjective: 2 Days Post-Op Procedure(s) (LRB): TRANSMETATARSAL AMPUTATION RIGHT FOOT WITH INTRAOPERATIVE PERCUTANEOUS HEEL CORD LENGTHENING  (Right) Patient reports pain as 2 on 0-10 scale.   Denies CP or SOB.  Voiding without difficulty. Positive flatus. Objective: Vital signs in last 24 hours: Temp:  [98.1 F (36.7 C)-99.5 F (37.5 C)] 98.1 F (36.7 C) (01/18 0750) Pulse Rate:  [79-110] 79 (01/18 0750) Resp:  [18-20] 18 (01/18 0750) BP: (125-211)/(69-111) 125/79 mmHg (01/18 0750) SpO2:  [95 %-100 %] 98 % (01/18 0750) Weight:  [110.224 kg (243 lb)] 110.224 kg (243 lb) (01/17 2052)  Intake/Output from previous day: 01/17 0701 - 01/18 0700 In: 1750 [P.O.:1200; IV Piggyback:550] Out: 625 [Urine:625] Intake/Output this shift: Total I/O In: 240 [P.O.:240] Out: 925 [Urine:925]  Labs:  Recent Labs  12/18/13 1116 12/18/13 2100 12/19/13 0424 12/21/13 0707  HGB 10.2* 8.5* 8.8* 9.0*    Recent Labs  12/19/13 0424 12/21/13 0707  WBC 11.4* 11.0*  RBC 3.86* 3.88*  HCT 28.4* 28.4*  PLT 253 277    Recent Labs  12/19/13 0424 12/21/13 0707  NA 139 141  K 4.4 4.4  CL 106 107  CO2 22 20  BUN 43* 33*  CREATININE 2.65* 2.60*  GLUCOSE 179* 117*  CALCIUM 8.4 8.4    Recent Labs  12/18/13 2100  INR 1.09    Physical Exam: ABD soft Incision: dressing C/D/I Compartment soft  Assessment/Plan: 2 Days Post-Op Procedure(s) (LRB): TRANSMETATARSAL AMPUTATION RIGHT FOOT WITH INTRAOPERATIVE PERCUTANEOUS HEEL CORD LENGTHENING  (Right) Advance diet Up with therapy  Traquan Duarte D for Dr. Melina Schools [facility] [phone] 12/21/2013, 10:12 AM

## 2013-12-21 NOTE — Progress Notes (Signed)
[facility] PROGRESS NOTE  [name] [id number] DOB: 1966-04-06 DOA: 12/18/2013 PCP: Leonard Downing, MD  Assessment/Plan: Osteomyelitis and Necrosis of Right Great Toe -Has been seen by ortho and is s/p a right metatarsal amputation. -MRI with Extensive abscess formation about the great toe extends proximally  in the dorsal soft tissues between the distal first and second  metatarsals in addition to osteomyelitis of great toe. -Discussed with Dr. Tommy Medal via phone: plan for PICC insertion and 2 weeks of vanc/cefepime.   DM -Continue NPH and SSI. -Fairly well controlled.  CKD Stage IV -At baseline. -Recheck renal function in am.  HTN -BP improved. -Suspect a lot of it is pain mediated. -Lisinopril on hold given renal insufficiency. -Will order IV PRN hydralazine.   Code Status: Full Code Family Communication: Wife at bedside updated on plan of care. Disposition Plan: To be determined; likely home in about 48 hours.   Consultants:  Karie Soda   Antibiotics:  Vanc  Cefepime  Subjective: No complaints.  Has been up.  Objective: Filed Vitals:   12/20/13 2052 12/21/13 0640 12/21/13 0642 12/21/13 0750  BP: 176/89 172/84 151/69 125/79  Pulse: 79 82 79 79  Temp: 99.5 F (37.5 C) 98.4 F (36.9 C)  98.1 F (36.7 C)  TempSrc: Oral Oral  Oral  Resp: 20 18  18   Height: 6\' 4"  (1.93 m)     Weight: 110.224 kg (243 lb)     SpO2: 95% 99%  98%    Intake/Output Summary (Last 24 hours) at 12/21/13 1408 Last data filed at 12/21/13 0830  Gross per 24 hour  Intake   1750 ml  Output   1550 ml  Net    200 ml   Filed Weights   12/18/13 1102 12/18/13 1936 12/20/13 2052  Weight: 106.595 kg (235 lb) 106.595 kg (235 lb) 110.224 kg (243 lb)    Exam:   General:  AA Ox3  Cardiovascular: RRR  Respiratory: CTA B  Abdomen: S/NT/ND/+BS  Extremities: Right foot wrapped.   Neurologic:  Non-focal.  Data Reviewed: Basic Metabolic  Panel:  Recent Labs Lab 12/18/13 1116 12/18/13 2100 12/19/13 0424 12/21/13 0707  NA 141 140 139 141  K 4.8 4.3 4.4 4.4  CL 105 105 106 107  CO2 20 21 22 20   GLUCOSE 184* 227* 179* 117*  BUN 43* 45* 43* 33*  CREATININE 2.68* 2.62* 2.65* 2.60*  CALCIUM 8.8 8.3* 8.4 8.4  MG  --  1.6  --   --   PHOS  --  3.5  --   --    Liver Function Tests:  Recent Labs Lab 12/18/13 2100 12/19/13 0424  AST 14 14  ALT 16 15  ALKPHOS 82 87  BILITOT <0.2* <0.2*  PROT 7.2 6.7  ALBUMIN 2.4* 2.1*   No results found for this basename: LIPASE, AMYLASE,  in the last 168 hours No results found for this basename: AMMONIA,  in the last 168 hours CBC:  Recent Labs Lab 12/18/13 1116 12/18/13 2100 12/19/13 0424 12/21/13 0707  WBC 13.0* 13.7* 11.4* 11.0*  NEUTROABS  --  9.9*  --   --   HGB 10.2* 8.5* 8.8* 9.0*  HCT 31.9* 27.1* 28.4* 28.4*  MCV 73.5* 73.6* 73.6* 73.2*  PLT 246 269 253 277   Cardiac Enzymes: No results found for this basename: CKTOTAL, CKMB, CKMBINDEX, TROPONINI,  in the last 168 hours BNP (last 3 results) No results found for this basename: PROBNP,  in the last 8760 hours  CBG:  Recent Labs Lab 12/20/13 1137 12/20/13 1649 12/20/13 2055 12/21/13 0804 12/21/13 1132  GLUCAP 94 158* 83 132* 147*    Recent Results (from the past 240 hour(s))  SURGICAL PCR SCREEN     Status: None   Collection Time    12/19/13  4:24 PM      Result Value Range Status   MRSA, PCR NEGATIVE  NEGATIVE Final   Staphylococcus aureus NEGATIVE  NEGATIVE Final   Comment:            The Xpert SA Assay (FDA     approved for NASAL specimens     in patients over 39 years of age),     is one component of     a comprehensive surveillance     program.  Test performance has     been validated by [employer] for patients greater     than or equal to 24 year old.     It is not intended     to diagnose infection nor to     guide or monitor treatment.  ANAEROBIC CULTURE     Status: None    Collection Time    12/19/13  6:52 PM      Result Value Range Status   Specimen Description ABSCESS RIGHT FOOT   Final   Special Requests NONE   Final   Gram Stain     Final   Value: ABUNDANT WBC PRESENT,BOTH PMN AND MONONUCLEAR     NO SQUAMOUS EPITHELIAL CELLS SEEN     MODERATE GRAM POSITIVE COCCI IN PAIRS     Performed at [facility]   Culture     Final   Value: NO ANAEROBES ISOLATED; CULTURE IN PROGRESS FOR 5 DAYS     Performed at [facility]   Report Status PENDING   Incomplete  CULTURE, ROUTINE-ABSCESS     Status: None   Collection Time    12/19/13  6:52 PM      Result Value Range Status   Specimen Description ABSCESS RIGHT FOOT   Final   Special Requests NONE   Final   Gram Stain     Final   Value: ABUNDANT WBC PRESENT,BOTH PMN AND MONONUCLEAR     NO SQUAMOUS EPITHELIAL CELLS SEEN     MODERATE GRAM POSITIVE COCCI IN PAIRS     Performed at [facility]   Culture     Final   Value: Culture reincubated for better growth     Performed at [facility]   Report Status PENDING   Incomplete     Studies: No results found.  Scheduled Meds: . ceFEPime (MAXIPIME) IV  1 g Intravenous Q24H  . docusate sodium  100 mg Oral BID  . enoxaparin (LOVENOX) injection  30 mg Subcutaneous Q24H  . insulin aspart  0-15 Units Subcutaneous TID WC  . insulin aspart  4 Units Subcutaneous TID WC  . insulin NPH Human  20 Units Subcutaneous QHS  . insulin NPH Human  25 Units Subcutaneous QAC breakfast  . senna  2 tablet Oral BID  . vancomycin  1,500 mg Intravenous Q24H   Continuous Infusions:   Principal Problem:   Necrosis of toe Active Problems:   Osteomyelitis   Leukocytosis   Anemia of chronic disease   CKD (chronic kidney disease) stage 4, GFR 15-29 ml/min    Time spent: 25 minutes. Greater than 50% of this time was spent in direct  contact with the patient coordinating care.    Lelon Frohlich  [facility] Pager [phone]  If  7PM-7AM, please contact night-coverage at [email or web address], password [facility] 12/21/2013, 2:08 PM  LOS: 3 days

## 2013-12-22 LAB — VANCOMYCIN, TROUGH: Vancomycin Tr: 28.4 ug/mL (ref 10.0–20.0)

## 2013-12-22 LAB — BASIC METABOLIC PANEL
BUN: 38 mg/dL — AB (ref 6–23)
CHLORIDE: 106 meq/L (ref 96–112)
CO2: 21 mEq/L (ref 19–32)
Calcium: 8.7 mg/dL (ref 8.4–10.5)
Creatinine, Ser: 2.68 mg/dL — ABNORMAL HIGH (ref 0.50–1.35)
GFR calc Af Amer: 29 mL/min — ABNORMAL LOW (ref >90)
GFR calc non Af Amer: 25 mL/min — ABNORMAL LOW (ref >90)
GLUCOSE: 107 mg/dL — AB (ref 70–99)
POTASSIUM: 4.7 meq/L (ref 3.7–5.3)
Sodium: 141 mEq/L (ref 137–147)

## 2013-12-22 LAB — GLUCOSE, CAPILLARY
GLUCOSE-CAPILLARY: 147 mg/dL — AB (ref 70–99)
GLUCOSE-CAPILLARY: 97 mg/dL (ref 70–99)
Glucose-Capillary: 120 mg/dL — ABNORMAL HIGH (ref 70–99)
Glucose-Capillary: 182 mg/dL — ABNORMAL HIGH (ref 70–99)

## 2013-12-22 MED ORDER — SODIUM CHLORIDE 0.9 % IJ SOLN: 10.0000 mL | INTRAMUSCULAR | Status: DC | PRN

## 2013-12-22 MED ORDER — DEXTROSE 5 % IV SOLN
2.0000 g | INTRAVENOUS | Status: DC
  Administered 2013-12-22 – 2013-12-23 (×2): 2 g via INTRAVENOUS
  Filled 2013-12-22 (×2): qty 2

## 2013-12-22 MED ORDER — ENOXAPARIN SODIUM 40 MG/0.4ML ~~LOC~~ SOLN
40.0000 mg | SUBCUTANEOUS | Status: DC
Start: 2013-12-23 — End: 2013-12-23
  Administered 2013-12-23: 40 mg via SUBCUTANEOUS
  Filled 2013-12-22: qty 0.4

## 2013-12-22 NOTE — Progress Notes (Signed)
[facility] PROGRESS NOTE  [name] [id number] DOB: 10-Jun-1961 DOA: 12/18/2013 PCP: Leonard Downing, MD  Assessment/Plan: Osteomyelitis and Necrosis of Right Great Toe -Has been seen by ortho and is s/p  right metatarsal amputation. -MRI with Extensive abscess formation about the great toe extends proximally  in the dorsal soft tissues between the distal first and second  metatarsals in addition to osteomyelitis of great toe. -Discussed with Dr. Tommy Medal via phone: plan for PICC insertion and 2 weeks of vanc/cefepime.   DM -Continue NPH and SSI. -Fairly well controlled.  CKD Stage IV -At baseline. -Recheck renal function in am.  HTN -BP improved. -Suspect a lot of it is pain mediated. -Lisinopril on hold given renal insufficiency. -Will order IV PRN hydralazine.   Code Status: Full Code Family Communication: Wife at bedside updated on plan of care. Disposition Plan: To be determined; likely home in about 24 hours once seen by ortho.   Consultants:  Karie Soda   Antibiotics:  Vanc  Cefepime  Subjective: No complaints.  Has been up.  Objective: Filed Vitals:   12/21/13 2156 12/22/13 0637 12/22/13 0759 12/22/13 1005  BP: 172/88 184/96 192/98 173/83  Pulse: 83 89 85 93  Temp: 99 F (37.2 C) 98.9 F (37.2 C) 98.7 F (37.1 C) 98.2 F (36.8 C)  TempSrc: Oral Oral Oral Oral  Resp: 18 18 18 18   Height:      Weight: 109.045 kg (240 lb 6.4 oz)     SpO2: 99% 99% 99% 95%    Intake/Output Summary (Last 24 hours) at 12/22/13 1323 Last data filed at 12/22/13 0700  Gross per 24 hour  Intake   1630 ml  Output   2300 ml  Net   -670 ml   Filed Weights   12/18/13 1936 12/20/13 2052 12/21/13 2156  Weight: 106.595 kg (235 lb) 110.224 kg (243 lb) 109.045 kg (240 lb 6.4 oz)    Exam:   General:  AA Ox3  Cardiovascular: RRR  Respiratory: CTA B  Abdomen: S/NT/ND/+BS  Extremities: Right foot wrapped.   Neurologic:  Non-focal.  Data  Reviewed: Basic Metabolic Panel:  Recent Labs Lab 12/18/13 1116 12/18/13 2100 12/19/13 0424 12/21/13 0707 12/22/13 0420  NA 141 140 139 141 141  K 4.8 4.3 4.4 4.4 4.7  CL 105 105 106 107 106  CO2 20 21 22 20 21   GLUCOSE 184* 227* 179* 117* 107*  BUN 43* 45* 43* 33* 38*  CREATININE 2.68* 2.62* 2.65* 2.60* 2.68*  CALCIUM 8.8 8.3* 8.4 8.4 8.7  MG  --  1.6  --   --   --   PHOS  --  3.5  --   --   --    Liver Function Tests:  Recent Labs Lab 12/18/13 2100 12/19/13 0424  AST 14 14  ALT 16 15  ALKPHOS 82 87  BILITOT <0.2* <0.2*  PROT 7.2 6.7  ALBUMIN 2.4* 2.1*   No results found for this basename: LIPASE, AMYLASE,  in the last 168 hours No results found for this basename: AMMONIA,  in the last 168 hours CBC:  Recent Labs Lab 12/18/13 1116 12/18/13 2100 12/19/13 0424 12/21/13 0707  WBC 13.0* 13.7* 11.4* 11.0*  NEUTROABS  --  9.9*  --   --   HGB 10.2* 8.5* 8.8* 9.0*  HCT 31.9* 27.1* 28.4* 28.4*  MCV 73.5* 73.6* 73.6* 73.2*  PLT 246 269 253 277   Cardiac Enzymes: No results found for this basename: CKTOTAL, CKMB, CKMBINDEX, TROPONINI,  in the last 168 hours BNP (last 3 results) No results found for this basename: PROBNP,  in the last 8760 hours CBG:  Recent Labs Lab 12/21/13 1132 12/21/13 1558 12/21/13 2153 12/22/13 0755 12/22/13 1206  GLUCAP 147* 80 143* 120* 182*    Recent Results (from the past 240 hour(s))  SURGICAL PCR SCREEN     Status: None   Collection Time    12/19/13  4:24 PM      Result Value Range Status   MRSA, PCR NEGATIVE  NEGATIVE Final   Staphylococcus aureus NEGATIVE  NEGATIVE Final   Comment:            The Xpert SA Assay (FDA     approved for NASAL specimens     in patients over 45 years of age),     is one component of     a comprehensive surveillance     program.  Test performance has     been validated by [employer] for patients greater     than or equal to 39 year old.     It is not intended     to diagnose  infection nor to     guide or monitor treatment.  ANAEROBIC CULTURE     Status: None   Collection Time    12/19/13  6:52 PM      Result Value Range Status   Specimen Description ABSCESS RIGHT FOOT   Final   Special Requests NONE   Final   Gram Stain     Final   Value: ABUNDANT WBC PRESENT,BOTH PMN AND MONONUCLEAR     NO SQUAMOUS EPITHELIAL CELLS SEEN     MODERATE GRAM POSITIVE COCCI IN PAIRS     Performed at [facility]   Culture     Final   Value: NO ANAEROBES ISOLATED; CULTURE IN PROGRESS FOR 5 DAYS     Performed at [facility]   Report Status PENDING   Incomplete  CULTURE, ROUTINE-ABSCESS     Status: None   Collection Time    12/19/13  6:52 PM      Result Value Range Status   Specimen Description ABSCESS RIGHT FOOT   Final   Special Requests NONE   Final   Gram Stain     Final   Value: ABUNDANT WBC PRESENT,BOTH PMN AND MONONUCLEAR     NO SQUAMOUS EPITHELIAL CELLS SEEN     MODERATE GRAM POSITIVE COCCI IN PAIRS     Performed at [facility]   Culture     Final   Value: FEW STAPHYLOCOCCUS AUREUS     Note: RIFAMPIN AND GENTAMICIN SHOULD NOT BE USED AS SINGLE DRUGS FOR TREATMENT OF STAPH INFECTIONS.     Performed at [facility]   Report Status PENDING   Incomplete     Studies: No results found.  Scheduled Meds: . ceFEPime (MAXIPIME) IV  2 g Intravenous Q24H  . docusate sodium  100 mg Oral BID  . [START ON 12/23/2013] enoxaparin (LOVENOX) injection  40 mg Subcutaneous Q24H  . insulin aspart  0-15 Units Subcutaneous TID WC  . insulin aspart  4 Units Subcutaneous TID WC  . insulin NPH Human  20 Units Subcutaneous QHS  . insulin NPH Human  25 Units Subcutaneous QAC breakfast  . senna  2 tablet Oral BID  . vancomycin  1,500 mg Intravenous Q24H   Continuous Infusions:   Principal Problem:   Necrosis of  toe Active Problems:   Osteomyelitis   Leukocytosis   Anemia of chronic disease   CKD (chronic kidney disease) stage 4, GFR 15-29  ml/min    Time spent: 25 minutes. Greater than 50% of this time was spent in direct contact with the patient coordinating care.    Lelon Frohlich  [facility] Pager [phone]  If 7PM-7AM, please contact night-coverage at [email or web address], password [facility] 12/22/2013, 1:23 PM  LOS: 4 days

## 2013-12-22 NOTE — Progress Notes (Signed)
Spoke with Dr Jerilee Hoh this morning concerning CRCL of 31.7 and pt being a stage 4 renal pt. She verbalizes consulting with Dr. Justin Mend and that he has given permission to place PICC at bedside. Lorri Frederick, RN

## 2013-12-22 NOTE — Progress Notes (Signed)
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   PT Comments   Pt is progressing well, tolerating increased ambulation distance appropriate for household mobility. He is experiencing back pain when ambulating and if NWB status is expected to be longer than 2 weeks, recommend knee walker for pt to limit stress on left foot as well as back. PT will continue to follow.   Follow Up Recommendations  Home health PT;Supervision for mobility/OOB     Does the patient have the potential to tolerate intense rehabilitation     Barriers to Discharge        Equipment Recommendations  Rolling walker with 5" wheels    Recommendations for Other Services    Frequency Min 5X/week   Progress towards PT Goals Progress towards PT goals: Progressing toward goals  Plan Current plan remains appropriate    Precautions / Restrictions Precautions Precautions: Fall Restrictions Weight Bearing Restrictions: Yes RLE Weight Bearing: Non weight bearing   Pertinent Vitals/Pain 8/10 back and foot pain, extremity elevated after treatment    Mobility  Bed Mobility Overal bed mobility: Independent Bed Mobility: Sit to Supine General bed mobility comments: pt moved into bed effectively from sitting and was able to position leg in elevated position Transfers Overall transfer level: Needs assistance Equipment used: Rolling walker (2 wheeled) Transfers: Sit to/from Stand Sit to Stand: Supervision General transfer comment: pt attempting to place hands on bed to get up but ends up needing to place them on the RW for leverage. He is pushing straight down with no tipping of the RW and no physical  assist needed but continue to work on pushing from bed for safety Ambulation/Gait Ambulation/Gait assistance: Supervision;Min guard Ambulation Distance (Feet): 70 Feet Assistive device: Rolling walker (2 wheeled) Gait Pattern/deviations: Step-to pattern Gait velocity: decreased General Gait Details: pt tolerated increased distance today, sufficient for household ambulation. He had one posterior LOB when he took a large hop but he self-corrected with only min-guard A. Otherwise given supervision only. He fatigues quickly and is epxperiencing some back pain with ambulation. If NWB status is prolonged, recommend knee walker    Exercises General Exercises - Lower Extremity Long Arc Quad: AROM;10 reps;Right;Seated   PT Diagnosis:    PT Problem List:   PT Treatment Interventions:     PT Goals (current goals can now be found in the care plan section) Acute Rehab PT Goals Patient Stated Goal: return home PT Goal Formulation: With patient Time For Goal Achievement: 01/03/14 Potential to Achieve Goals: Good  Visit Information  Last PT Received On: 12/22/13 Assistance Needed: +1 History of Present Illness: Patient is a 53 y/o male admitted with right great toe infection, 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    Subjective Data  Subjective: pt concerned about going home because he fatigues quickly with ambulation but says he has a lot of help Patient Stated Goal: return home   Cognition  Cognition Arousal/Alertness: Awake/alert Behavior During Therapy: WFL for tasks assessed/performed Overall Cognitive Status: Within Functional Limits for tasks assessed    Balance  Balance Overall balance assessment: Needs assistance Standing balance support: Single extremity supported Standing balance-Leahy Scale: Fair Standing balance comment: is able to balance with unilateral support  End of Session PT - End of Session Equipment Utilized During Treatment: Gait  belt Activity Tolerance: Patient tolerated treatment well Patient left: in bed;with call bell/phone within reach Nurse Communication: Mobility status   GP   [name], [place]  [place], [place] 12/22/2013, 12:38 PM

## 2013-12-22 NOTE — Progress Notes (Signed)
OT Cancellation Note and Discharge  Patient Details Name: [name] MRN: [id number] DOB: 08/13/1961   Cancelled Treatment:     In to see pt for treatment. Pt reports he just got back from bathroom with ex-wife's help and he as well as she do not have any concerns about BADLs. Acute OT will sign off.  Almon Register [id number] 12/22/2013, 2:40 PM

## 2013-12-22 NOTE — Progress Notes (Signed)
Peripherally Inserted Central Catheter/Midline Placement  The IV Nurse has discussed with the patient and/or persons authorized to consent for the patient, the purpose of this procedure and the potential benefits and risks involved with this procedure.  The benefits include less needle sticks, lab draws from the catheter and patient may be discharged home with the catheter.  Risks include, but not limited to, infection, bleeding, blood clot (thrombus formation), and puncture of an artery; nerve damage and irregular heat beat.  Alternatives to this procedure were also discussed.  PICC/Midline Placement Documentation  PICC / Midline Single Lumen [id number] PICC Right Basilic 51 cm 5 cm (Active)  Indication for Insertion or Continuance of Line Home intravenous therapies (PICC only) 12/22/2013  9:35 AM  Exposed Catheter (cm) 5 cm 12/22/2013  9:35 AM  Dressing Change Due 12/29/13 12/22/2013  9:35 AM       Poff, Mordecai Rasmussen 12/22/2013, 9:37 AM

## 2013-12-22 NOTE — Progress Notes (Addendum)
Subjective: 3 Days Post-Op Procedure(s) (LRB): TRANSMETATARSAL AMPUTATION RIGHT FOOT WITH INTRAOPERATIVE PERCUTANEOUS HEEL CORD LENGTHENING  (Right) Patient doing well this AM.  Just PICC line placed with no complication.  Pt states he is in no pain at this time.  Pt denies N/V/F/C, chest pain, SOB, calf pain, or changes in appetite.  Objective: Vital signs in last 24 hours: Temp:  [98.2 F (36.8 C)-99 F (37.2 C)] 98.2 F (36.8 C) (01/19 1005) Pulse Rate:  [83-93] 93 (01/19 1005) Resp:  [18] 18 (01/19 1005) BP: (172-192)/(83-98) 173/83 mmHg (01/19 1005) SpO2:  [95 %-99 %] 95 % (01/19 1005) Weight:  [109.045 kg (240 lb 6.4 oz)] 109.045 kg (240 lb 6.4 oz) (01/18 2156)  Intake/Output from previous day: 01/18 0701 - 01/19 0700 In: 1870 [P.O.:1320; IV Piggyback:550] Out: [id number] [Urine:3225] Intake/Output this shift:     Recent Labs  12/21/13 0707  HGB 9.0*    Recent Labs  12/21/13 0707  WBC 11.0*  RBC 3.88*  HCT 28.4*  PLT 277    Recent Labs  12/21/13 0707 12/22/13 0420  NA 141 141  K 4.4 4.7  CL 107 106  CO2 20 21  BUN 33* 38*  CREATININE 2.60* 2.68*  GLUCOSE 117* 107*  CALCIUM 8.4 8.7   No results found for this basename: LABPT, INR,  in the last 72 hours  WD WN 53 y/o male in NAD, A/Ox3, appears stated age.  EOMI, mood and affect normal, respirations unlabored.  Dressing c/d/i, in proper placement and good repair.  Decreased sensation to light touch.  Assessment/Plan: 3 Days Post-Op Procedure(s) (LRB): TRANSMETATARSAL AMPUTATION RIGHT FOOT WITH INTRAOPERATIVE PERCUTANEOUS HEEL CORD LENGTHENING  (Right) Continue antibiotics for osteomyelitis.  Discharge home pending internal medicine with recommendation from physical therapy.  F/u with Dr. Doran Durand in 2 weeks from date of discharge.  FLOWERS, CHRISTOPHER S 12/22/2013, 5:50 PM   Agree with above.

## 2013-12-22 NOTE — Progress Notes (Signed)
   CARE MANAGEMENT NOTE 12/22/2013  Patient:  [name],[name]   Account Number:  [id number]  Date Initiated:  12/19/2013  Documentation initiated by:  SHAVIS,ALESIA  Subjective/Objective Assessment:   osteomyelitis     Action/Plan:   waiting PT/OT   Anticipated DC Date:  12/22/2013   Anticipated DC Plan:  HOME W HOME HEALTH SERVICES      DC Planning Services  CM consult      PAC Choice  HOME HEALTH   Choice offered to / List presented to:  C-1 Patient        HH arranged  HH-2 PT  IV Antibiotics      HH agency  [facility]   Status of service:  Completed, signed off Medicare Important Message given?   (If response is "NO", the following Medicare IM given date fields will be blank) Date Medicare IM given:   Date Additional Medicare IM given:    Discharge Disposition:  HOME W HOME HEALTH SERVICES  Per UR Regulation:    If discussed at Long Length of Stay Meetings, dates discussed:    Comments:  12/22/2013  1000   RN, CCM  [phone] CM referral: home IV antibiotics x 2 wks                      home health PT  Met with patient regarding home IV antibiotic therapy and choice of agency. He selected [facility] and his wife can help with the medications, she had done that once before.  [facility]/ Marie called with referral for home IV antibiotics and HHPT  12/19/2013  1500   RN, CCM  [phone]  Spoke with PT regarding DME needs. Per PT knee walker may work for patient.  Benefits check/ knee walker:   $1000 deductible for DME, if met then 20 %copay.  

## 2013-12-22 NOTE — Progress Notes (Signed)
ANTIBIOTIC CONSULT NOTE - FOLLOW UP  Pharmacy Consult for Vancomycin Indication: Osteomyelitis  No Known Allergies  Patient Measurements: Height: 6\' 4"  (193 cm) Weight: 239 lb 12.8 oz (108.773 kg) IBW/kg (Calculated) : 86.8  Vital Signs: Temp: 98.8 F (37.1 C) (01/19 2100) Temp src: Oral (01/19 2100) BP: 166/84 mmHg (01/19 2100) Pulse Rate: 87 (01/19 2100) Intake/Output from previous day: 01/18 0701 - 01/19 0700 In: 1870 [P.O.:1320; IV Piggyback:550] Out: [id number] [Urine:3225] Intake/Output from this shift:    Labs:  Recent Labs  12/21/13 0707 12/22/13 0420  WBC 11.0*  --   HGB 9.0*  --   PLT 277  --   CREATININE 2.60* 2.68*   Estimated Creatinine Clearance: 42.6 ml/min (by C-G formula based on Cr of 2.68).  Recent Labs  12/22/13 2040  [place] 28.4*     Microbiology: Recent Results (from the past 720 hour(s))  SURGICAL PCR SCREEN     Status: None   Collection Time    12/19/13  4:24 PM      Result Value Range Status   MRSA, PCR NEGATIVE  NEGATIVE Final   Staphylococcus aureus NEGATIVE  NEGATIVE Final   Comment:            The Xpert SA Assay (FDA     approved for NASAL specimens     in patients over 74 years of age),     is one component of     a comprehensive surveillance     program.  Test performance has     been validated by [employer] for patients greater     than or equal to 31 year old.     It is not intended     to diagnose infection nor to     guide or monitor treatment.  ANAEROBIC CULTURE     Status: None   Collection Time    12/19/13  6:52 PM      Result Value Range Status   Specimen Description ABSCESS RIGHT FOOT   Final   Special Requests NONE   Final   Gram Stain     Final   Value: ABUNDANT WBC PRESENT,BOTH PMN AND MONONUCLEAR     NO SQUAMOUS EPITHELIAL CELLS SEEN     MODERATE GRAM POSITIVE COCCI IN PAIRS     Performed at [facility]   Culture     Final   Value: NO ANAEROBES ISOLATED; CULTURE IN PROGRESS FOR 5 DAYS      Performed at [facility]   Report Status PENDING   Incomplete  CULTURE, ROUTINE-ABSCESS     Status: None   Collection Time    12/19/13  6:52 PM      Result Value Range Status   Specimen Description ABSCESS RIGHT FOOT   Final   Special Requests NONE   Final   Gram Stain     Final   Value: ABUNDANT WBC PRESENT,BOTH PMN AND MONONUCLEAR     NO SQUAMOUS EPITHELIAL CELLS SEEN     MODERATE GRAM POSITIVE COCCI IN PAIRS     Performed at [facility]   Culture     Final   Value: FEW STAPHYLOCOCCUS AUREUS     Note: RIFAMPIN AND GENTAMICIN SHOULD NOT BE USED AS SINGLE DRUGS FOR TREATMENT OF STAPH INFECTIONS.     Performed at [facility]   Report Status PENDING   Incomplete    Anti-infectives   Start     [employer]  Frequency Ordered Stop   12/22/13 1300  ceFEPIme (MAXIPIME) 2 g in dextrose 5 % 50 mL IVPB     2 g 100 mL/hr over 30 Minutes Intravenous Every 24 hours 12/22/13 1136     12/20/13 2030  vancomycin (VANCOCIN) 1,500 mg in sodium chloride 0.9 % 500 mL IVPB     1,500 mg 250 mL/hr over 120 Minutes Intravenous Every 24 hours 12/20/13 1414     12/20/13 1415  vancomycin (VANCOCIN) 1,500 mg in sodium chloride 0.9 % 500 mL IVPB  Status:  Discontinued     1,500 mg 250 mL/hr over 120 Minutes Intravenous Every 24 hours 12/20/13 1403 12/20/13 1414   12/20/13 1145  ceFEPIme (MAXIPIME) 1 g in dextrose 5 % 50 mL IVPB  Status:  Discontinued     1 g 100 mL/hr over 30 Minutes Intravenous Every 24 hours 12/20/13 1126 12/22/13 1136   12/19/13 2000  vancomycin (VANCOCIN) 1,500 mg in sodium chloride 0.9 % 500 mL IVPB  Status:  Discontinued     1,500 mg 250 mL/hr over 120 Minutes Intravenous Every 24 hours 12/18/13 1853 12/20/13 1046   12/19/13 0300  piperacillin-tazobactam (ZOSYN) IVPB 3.375 g  Status:  Discontinued     3.375 g 12.5 mL/hr over 240 Minutes Intravenous 3 times per day 12/18/13 1853 12/20/13 1403   12/18/13 2000  vancomycin (VANCOCIN) 2,000 mg in sodium  chloride 0.9 % 500 mL IVPB     2,000 mg 250 mL/hr over 120 Minutes Intravenous  Once 12/18/13 1853 12/18/13 2308   12/18/13 1530  piperacillin-tazobactam (ZOSYN) IVPB 3.375 g     3.375 g 12.5 mL/hr over 240 Minutes Intravenous  Once 12/18/13 1519 12/18/13 2133      Assessment: 53 year old male on Day #5 of Vancomycin and Cefepime for osteomyelitis.  A vancomycin trough was obtained tonight but his dose was started prior to the trough being obtained.  This lab is uninterpretable, and will need to be repeated as a true trough on 1/20.  Goal of Therapy:  Vancomycin trough level 15-20 mcg/ml  Plan:  Continue Vancomycin 1500mg  IV q24h Check Vancomycin trough prior to dose on 1/20 PM.  Legrand Como, Pharm.D., BCPS, AAHIVP Clinical Pharmacist Phone: [phone] or [phone] 12/22/2013, 10:16 PM

## 2013-12-23 ENCOUNTER — Encounter (HOSPITAL_COMMUNITY): Payer: Self-pay | Admitting: Orthopedic Surgery

## 2013-12-23 LAB — BASIC METABOLIC PANEL
BUN: 40 mg/dL — ABNORMAL HIGH (ref 6–23)
CHLORIDE: 106 meq/L (ref 96–112)
CO2: 21 meq/L (ref 19–32)
CREATININE: 2.77 mg/dL — AB (ref 0.50–1.35)
Calcium: 8.5 mg/dL (ref 8.4–10.5)
GFR calc Af Amer: 28 mL/min — ABNORMAL LOW (ref >90)
GFR calc non Af Amer: 24 mL/min — ABNORMAL LOW (ref >90)
GLUCOSE: 150 mg/dL — AB (ref 70–99)
Potassium: 4.7 mEq/L (ref 3.7–5.3)
SODIUM: 139 meq/L (ref 137–147)

## 2013-12-23 LAB — CULTURE, ROUTINE-ABSCESS

## 2013-12-23 LAB — GLUCOSE, CAPILLARY
GLUCOSE-CAPILLARY: 125 mg/dL — AB (ref 70–99)
Glucose-Capillary: 100 mg/dL — ABNORMAL HIGH (ref 70–99)

## 2013-12-23 MED ORDER — DEXTROSE 5 % IV SOLN: 2.0000 g | INTRAVENOUS | Status: AC

## 2013-12-23 MED ORDER — AMLODIPINE BESYLATE 5 MG PO TABS: 5.0000 mg | ORAL_TABLET | Freq: Every day | ORAL | Status: DC

## 2013-12-23 MED ORDER — OXYCODONE HCL 5 MG PO TABS: 5.0000 mg | ORAL_TABLET | ORAL | Status: DC | PRN

## 2013-12-23 MED ORDER — VANCOMYCIN HCL 10 G IV SOLR: 1500.0000 mg | INTRAVENOUS | Status: AC

## 2013-12-23 MED ORDER — CYCLOBENZAPRINE HCL 10 MG PO TABS: 10.0000 mg | ORAL_TABLET | Freq: Three times a day (TID) | ORAL | Status: DC | PRN

## 2013-12-23 NOTE — Anesthesia Postprocedure Evaluation (Signed)
  Anesthesia Post-op Note  Patient: [name]  Procedure(s) Performed: Procedure(s): TRANSMETATARSAL AMPUTATION RIGHT FOOT WITH INTRAOPERATIVE PERCUTANEOUS HEEL CORD LENGTHENING  (Right)  Patient Location: PACU  Anesthesia Type:General  Level of Consciousness: awake, alert , oriented and patient cooperative  Airway and Oxygen Therapy: Patient Spontanous Breathing  Post-op Pain: none  Post-op Assessment: Post-op Vital signs reviewed and Patient's Cardiovascular Status Stable  Post-op Vital Signs: Reviewed and stable  Complications: No apparent anesthesia complications

## 2013-12-23 NOTE — Progress Notes (Signed)
Pt reviewed discharge instructions and verbalized understanding of foot care, antibiotic therapy, and PICC line care.  Pt was sent home with prescriptions and instructions to make appointments with Orthopedic Surgeon and Family Medicine.  Escorted to hospital exit via wheelchair by student nurse and placed in car with wife.  [name]

## 2013-12-23 NOTE — Progress Notes (Signed)
Subjective: 4 Days Post-Op Procedure(s) (LRB): TRANSMETATARSAL AMPUTATION RIGHT FOOT WITH INTRAOPERATIVE PERCUTANEOUS HEEL CORD LENGTHENING  (Right) Patient reports pain as mild.  Well controlled with oral pain meds.  Objective: Vital signs in last 24 hours: Temp:  [98 F (36.7 C)-98.8 F (37.1 C)] 98 F (36.7 C) (01/20 0850) Pulse Rate:  [78-120] 120 (01/20 0850) Resp:  [18] 18 (01/20 0850) BP: (166-206)/(84-97) 199/97 mmHg (01/20 1151) SpO2:  [95 %-97 %] 97 % (01/20 0850) Weight:  [108.773 kg (239 lb 12.8 oz)] 108.773 kg (239 lb 12.8 oz) (01/19 2100)  Intake/Output from previous day: 01/19 0701 - 01/20 0700 In: 670 [P.O.:120; IV Piggyback:550] Out: 200 [Urine:200] Intake/Output this shift:     Recent Labs  12/21/13 0707  HGB 9.0*    Recent Labs  12/21/13 0707  WBC 11.0*  RBC 3.88*  HCT 28.4*  PLT 277    Recent Labs  12/22/13 0420 12/23/13 0630  NA 141 139  K 4.7 4.7  CL 106 106  CO2 21 21  BUN 38* 40*  CREATININE 2.68* 2.77*  GLUCOSE 107* 150*  CALCIUM 8.7 8.5   No results found for this basename: LABPT, INR,  in the last 72 hours  PE:  right lower extremity with splint in place.  Intact and in good repair.  Assessment/Plan: 4 Days Post-Op Procedure(s) (LRB): TRANSMETATARSAL AMPUTATION RIGHT FOOT WITH INTRAOPERATIVE PERCUTANEOUS HEEL CORD LENGTHENING  (Right) Pt is ok for discharge home at any time from ortho perspective.  He should be NWB and f/u with me in two weeks in clinic.  [name] 12/23/2013, 2:08 PM

## 2013-12-23 NOTE — Discharge Summary (Signed)
Physician Discharge Summary  [name] [id number] DOB: 1961-02-04 DOA: 12/18/2013  PCP: Leonard Downing, MD  Admit date: 12/18/2013 Discharge date: 12/23/2013  Time spent: 45 minutes  Recommendations for Outpatient Follow-up:  -Will be discharged home today. -Will be on 2 weeks of IV vanc/cefepime monitored by [facility] agency. -Will follow up with Dr. Doran Durand in 2 weeks. -Also advised to follow up with PCP in 2 weeks for continued BP monitoring.  Discharge Diagnoses:  Principal Problem:   Necrosis of toe Active Problems:   Osteomyelitis   Leukocytosis   Anemia of chronic disease   CKD (chronic kidney disease) stage 4, GFR 15-29 ml/min   Discharge Condition: Stable and improved  Filed Weights   12/20/13 2052 12/21/13 2156 12/22/13 2100  Weight: 110.224 kg (243 lb) 109.045 kg (240 lb 6.4 oz) 108.773 kg (239 lb 12.8 oz)    History of present illness:  53 year old male with past medical history of hypertension, diabetes with complication of previous toe amputation and poor wound heeling who presented to [facility] ED 12/18/2013 with complaints of poor ulcer healing on the right great toe. He has had this infection for some time now, months, has been seen in wound care center but right great toe ulceration has progressively gotten worse now with purulent drainage and he was told he may even require an amputation. No fevers or chills at home. No other complaints of chest pain, nausea and vomiting. No abdominal pain.  provided by the patient.  In ED, vital signs are stable with BP 162/90, HR 72 and T max 98.6 F. WBC count was 13 and a hemoglobin 10.2. Creatinine was 2.68 and baseline is around 2.4. X rays of the right foot revealed possible osteomyelitis of first distal phalanx. Pt was started on broad spectrum antibiotics and ortho has been consulted.    Hospital Course:   Osteomyelitis and Necrosis of Right Great Toe  -Has been seen by ortho and is s/p right metatarsal amputation.   -MRI with Extensive abscess formation about the great toe extends proximally  in the dorsal soft tissues between the distal first and second  metatarsals in addition to osteomyelitis of great toe.  -Discussed with Dr. Tommy Medal via phone: plan for PICC insertion and 2 weeks of vanc/cefepime to be completed on 01/05/14.  DM  -Continue NPH. -Fairly well controlled.   CKD Stage IV  -At baseline.  -Recheck renal function in am.   HTN  -BP improved.  -Suspect a lot of it is pain mediated.  -Lisinopril on hold given renal insufficiency.  -Will add norvasc 5 mg. -Will need continued BP monitoring in the outpatient setting.     Procedures:  None   Consultations:  None  Discharge Instructions  Discharge Orders   Future Orders Complete By Expires   Diet - low sodium heart healthy  As directed    Discontinue IV  As directed    Increase activity slowly  As directed        Medication List    STOP taking these medications       doxycycline 100 MG capsule  Commonly known as:  MONODOX     lisinopril 10 MG tablet  Commonly known as:  PRINIVIL,ZESTRIL      TAKE these medications       amLODipine 5 MG tablet  Commonly known as:  NORVASC  Take 1 tablet (5 mg total) by mouth daily.     cyclobenzaprine 10 MG tablet  Commonly known as:  FLEXERIL  Take 1 tablet (10 mg total) by mouth 3 (three) times daily as needed for muscle spasms.     dextrose 5 % SOLN 50 mL with ceFEPIme 2 G SOLR 2 g  Inject 2 g into the vein daily.     insulin NPH-regular Human (70-30) 100 UNIT/ML injection  Commonly known as:  NOVOLIN 70/30  Inject 20-25 Units into the skin See admin instructions. 25 units in the morning, 20 units at night     oxyCODONE 5 MG immediate release tablet  Commonly known as:  Oxy IR/ROXICODONE  Take 1 tablet (5 mg total) by mouth every 4 (four) hours as needed for severe pain.     sodium chloride 0.9 % SOLN 500 mL with vancomycin 10 G SOLR 1,500 mg  Inject 1,500 mg  into the vein daily.       No Known Allergies     Follow-up Information   Follow up with HEWITT, Jenny Reichmann, MD. Schedule an appointment as soon as possible for a visit in 2 weeks.   Specialty:  Orthopedic Surgery   Contact information:   [address] [place] [address] [phone]       Follow up with Leonard Downing, MD. Schedule an appointment as soon as possible for a visit in 2 weeks.   Specialty:  Family Medicine   Contact information:   [place] [address] [phone]       Follow up with [name], MD. Schedule an appointment as soon as possible for a visit in 2 weeks.   Specialty:  Orthopedic Surgery   Contact information:   [address] [place] [address] [place] [address] [phone]        The results of significant diagnostics from this hospitalization (including imaging, microbiology, ancillary and laboratory) are listed below for reference.    Significant Diagnostic Studies: Mr Foot Right Wo Contrast  12/19/2013   CLINICAL DATA:  Diabetic patient with an ulcer on the right great toe.  EXAM: MRI OF THE RIGHT FOREFOOT WITHOUT CONTRAST  TECHNIQUE: Multiplanar, multisequence MR imaging was performed. No intravenous contrast was administered.  COMPARISON:  Plain films 06/25/2013, 08/20/2013 and 12/18/2013.  FINDINGS: The patient is status post amputation of the second toe and third ray at the level of the head of the third metatarsal. Extensive soft tissue edema is present about the foot, worst about the great toe. There is a focal fluid collection in the dorsal subcutaneous tissues measuring approximately 1.1 cm craniocaudal by 2.6 cm transverse by 4.5 cm long. The collection is centered between the heads of the second and third metatarsals and extending distally along the great toe. There also appears to be circumferential abscess formation about the great toe although lack of IV contrast somewhat limits  evaluation. Skin ulceration is seen along the dorsal in medial margin of the great toe at the level of the head of the proximal phalanx.  There is edema throughout the proximal phalanx of the great toe with corresponding decreased T1 signal consistent with osteomyelitis. Markedly decreased T1 signal with areas of decreased T2 signal also seen in the distal phalanx of the great toe consistent with chronic osteomyelitis and osteonecrosis. No other evidence of osteomyelitis is identified. Well-marginated erosions in the head of the first metatarsal in appearance most compatible with changes from gout. Remote fracture of the head of the second metatarsal is noted.  IMPRESSION: Cellulitis about the great toe with changes of osteomyelitis throughout the proximal and  distal phalanges. There is likely osteonecrosis of the distal phalanx.  Extensive abscess formation about the great toe extends proximally in the dorsal soft tissues between the distal first and second metatarsals as described above.  Erosions in the head of the first metatarsal have an appearance most suggestive of the sequelae of gout.  Status post amputation of the second toe and third ray at the level of the distal metatarsal.   Electronically Signed   By: Inge Rise M.D.   On: 12/19/2013 08:21   Dg Foot Complete Right  12/18/2013   CLINICAL DATA:  Toe pain and wounds.  EXAM: RIGHT FOOT COMPLETE - 3+ VIEW  COMPARISON:  August 20, 2013.  FINDINGS: Status post surgical amputation of second and third toes. There does appear to be destruction of the distal portion of the first distal phalanx suggesting osteomyelitis. Vascular calcifications are noted.  IMPRESSION: Probable lytic destruction of distal portion of first distal phalanx is noted suggesting osteomyelitis.   Electronically Signed   By: Sabino Dick M.D.   On: 12/18/2013 16:21    Microbiology: Recent Results (from the past 240 hour(s))  SURGICAL PCR SCREEN     Status: None    Collection Time    12/19/13  4:24 PM      Result Value Range Status   MRSA, PCR NEGATIVE  NEGATIVE Final   Staphylococcus aureus NEGATIVE  NEGATIVE Final   Comment:            The Xpert SA Assay (FDA     approved for NASAL specimens     in patients over 54 years of age),     is one component of     a comprehensive surveillance     program.  Test performance has     been validated by [employer] for patients greater     than or equal to 55 year old.     It is not intended     to diagnose infection nor to     guide or monitor treatment.  ANAEROBIC CULTURE     Status: None   Collection Time    12/19/13  6:52 PM      Result Value Range Status   Specimen Description ABSCESS RIGHT FOOT   Final   Special Requests NONE   Final   Gram Stain     Final   Value: ABUNDANT WBC PRESENT,BOTH PMN AND MONONUCLEAR     NO SQUAMOUS EPITHELIAL CELLS SEEN     MODERATE GRAM POSITIVE COCCI IN PAIRS     Performed at [facility]   Culture     Final   Value: NO ANAEROBES ISOLATED; CULTURE IN PROGRESS FOR 5 DAYS     Performed at [facility]   Report Status PENDING   Incomplete  CULTURE, ROUTINE-ABSCESS     Status: None   Collection Time    12/19/13  6:52 PM      Result Value Range Status   Specimen Description ABSCESS RIGHT FOOT   Final   Special Requests NONE   Final   Gram Stain     Final   Value: ABUNDANT WBC PRESENT,BOTH PMN AND MONONUCLEAR     NO SQUAMOUS EPITHELIAL CELLS SEEN     MODERATE GRAM POSITIVE COCCI IN PAIRS     Performed at [facility]   Culture     Final   Value: FEW STAPHYLOCOCCUS AUREUS     Note: RIFAMPIN AND GENTAMICIN  SHOULD NOT BE USED AS SINGLE DRUGS FOR TREATMENT OF STAPH INFECTIONS.     Performed at [facility]   Report Status 12/23/2013 FINAL   Final   Organism ID, Bacteria STAPHYLOCOCCUS AUREUS   Final     Labs: Basic Metabolic Panel:  Recent Labs Lab 12/18/13 1116 12/18/13 2100 12/19/13 0424 12/21/13 0707  12/22/13 0420 12/23/13 0630  NA 141 140 139 141 141 139  K 4.8 4.3 4.4 4.4 4.7 4.7  CL 105 105 106 107 106 106  CO2 20 21 22 20 21 21   GLUCOSE 184* 227* 179* 117* 107* 150*  BUN 43* 45* 43* 33* 38* 40*  CREATININE 2.68* 2.62* 2.65* 2.60* 2.68* 2.77*  CALCIUM 8.8 8.3* 8.4 8.4 8.7 8.5  MG  --  1.6  --   --   --   --   PHOS  --  3.5  --   --   --   --    Liver Function Tests:  Recent Labs Lab 12/18/13 2100 12/19/13 0424  AST 14 14  ALT 16 15  ALKPHOS 82 87  BILITOT <0.2* <0.2*  PROT 7.2 6.7  ALBUMIN 2.4* 2.1*   No results found for this basename: LIPASE, AMYLASE,  in the last 168 hours No results found for this basename: AMMONIA,  in the last 168 hours CBC:  Recent Labs Lab 12/18/13 1116 12/18/13 2100 12/19/13 0424 12/21/13 0707  WBC 13.0* 13.7* 11.4* 11.0*  NEUTROABS  --  9.9*  --   --   HGB 10.2* 8.5* 8.8* 9.0*  HCT 31.9* 27.1* 28.4* 28.4*  MCV 73.5* 73.6* 73.6* 73.2*  PLT 246 269 253 277   Cardiac Enzymes: No results found for this basename: CKTOTAL, CKMB, CKMBINDEX, TROPONINI,  in the last 168 hours BNP: BNP (last 3 results) No results found for this basename: PROBNP,  in the last 8760 hours CBG:  Recent Labs Lab 12/22/13 1206 12/22/13 1724 12/22/13 2057 12/23/13 0818 12/23/13 1158  GLUCAP 182* 147* 97 125* 100*       Signed:  HERNANDEZ ACOSTA,ESTELA  [facility] Pager: [phone] 12/23/2013, 2:54 PM

## 2013-12-24 LAB — ANAEROBIC CULTURE

## 2014-06-17 ENCOUNTER — Encounter (HOSPITAL_BASED_OUTPATIENT_CLINIC_OR_DEPARTMENT_OTHER): Payer: Managed Care, Other (non HMO) | Attending: General Surgery

## 2014-09-03 DIAGNOSIS — M869 Osteomyelitis, unspecified: Secondary | ICD-10-CM

## 2014-09-03 HISTORY — DX: Osteomyelitis, unspecified: M86.9

## 2014-09-22 ENCOUNTER — Encounter (HOSPITAL_COMMUNITY): Payer: Self-pay | Admitting: Emergency Medicine

## 2014-09-22 ENCOUNTER — Emergency Department (HOSPITAL_COMMUNITY)
Admission: EM | Admit: 2014-09-22 | Discharge: 2014-09-22 | Disposition: A | Discharge status: Home / Self Care | Payer: Managed Care, Other (non HMO) | Attending: Emergency Medicine | Admitting: Emergency Medicine

## 2014-09-22 ENCOUNTER — Emergency Department (HOSPITAL_COMMUNITY): Payer: Managed Care, Other (non HMO)

## 2014-09-22 DIAGNOSIS — Z79899 Other long term (current) drug therapy: Secondary | ICD-10-CM | POA: Insufficient documentation

## 2014-09-22 DIAGNOSIS — I10 Essential (primary) hypertension: Secondary | ICD-10-CM | POA: Diagnosis not present

## 2014-09-22 DIAGNOSIS — E119 Type 2 diabetes mellitus without complications: Secondary | ICD-10-CM | POA: Insufficient documentation

## 2014-09-22 DIAGNOSIS — M869 Osteomyelitis, unspecified: Secondary | ICD-10-CM | POA: Diagnosis not present

## 2014-09-22 DIAGNOSIS — Z794 Long term (current) use of insulin: Secondary | ICD-10-CM | POA: Diagnosis not present

## 2014-09-22 DIAGNOSIS — L089 Local infection of the skin and subcutaneous tissue, unspecified: Secondary | ICD-10-CM | POA: Diagnosis present

## 2014-09-22 DIAGNOSIS — I96 Gangrene, not elsewhere classified: Secondary | ICD-10-CM

## 2014-09-22 DIAGNOSIS — Z8719 Personal history of other diseases of the digestive system: Secondary | ICD-10-CM | POA: Diagnosis not present

## 2014-09-22 DIAGNOSIS — M87078 Idiopathic aseptic necrosis of left toe(s): Secondary | ICD-10-CM | POA: Diagnosis not present

## 2014-09-22 LAB — CBC WITH DIFFERENTIAL/PLATELET
BASOS ABS: 0 10*3/uL (ref 0.0–0.1)
Basophils Relative: 0 % (ref 0–1)
EOS ABS: 0.2 10*3/uL (ref 0.0–0.7)
Eosinophils Relative: 2 % (ref 0–5)
HCT: 33.8 % — ABNORMAL LOW (ref 39.0–52.0)
Hemoglobin: 10.5 g/dL — ABNORMAL LOW (ref 13.0–17.0)
LYMPHS ABS: 1.9 10*3/uL (ref 0.7–4.0)
Lymphocytes Relative: 22 % (ref 12–46)
MCH: 23 pg — AB (ref 26.0–34.0)
MCHC: 31.1 g/dL (ref 30.0–36.0)
MCV: 74 fL — ABNORMAL LOW (ref 78.0–100.0)
Monocytes Absolute: 0.7 10*3/uL (ref 0.1–1.0)
Monocytes Relative: 8 % (ref 3–12)
NEUTROS PCT: 67 % (ref 43–77)
Neutro Abs: 5.6 10*3/uL (ref 1.7–7.7)
Platelets: 194 10*3/uL (ref 150–400)
RBC: 4.57 MIL/uL (ref 4.22–5.81)
RDW: 15.5 % (ref 11.5–15.5)
WBC: 8.3 10*3/uL (ref 4.0–10.5)

## 2014-09-22 LAB — I-STAT CG4 LACTIC ACID, ED: Lactic Acid, Venous: 0.88 mmol/L (ref 0.5–2.2)

## 2014-09-22 LAB — COMPREHENSIVE METABOLIC PANEL
ALK PHOS: 87 U/L (ref 39–117)
ALT: 21 U/L (ref 0–53)
AST: 18 U/L (ref 0–37)
Albumin: 3.3 g/dL — ABNORMAL LOW (ref 3.5–5.2)
Anion gap: 12 (ref 5–15)
BUN: 45 mg/dL — ABNORMAL HIGH (ref 6–23)
CO2: 19 mEq/L (ref 19–32)
Calcium: 8.8 mg/dL (ref 8.4–10.5)
Chloride: 108 mEq/L (ref 96–112)
Creatinine, Ser: 2.94 mg/dL — ABNORMAL HIGH (ref 0.50–1.35)
GFR calc non Af Amer: 22 mL/min — ABNORMAL LOW (ref >90)
GFR, EST AFRICAN AMERICAN: 26 mL/min — AB (ref >90)
GLUCOSE: 180 mg/dL — AB (ref 70–99)
Potassium: 5.1 mEq/L (ref 3.7–5.3)
SODIUM: 139 meq/L (ref 137–147)
TOTAL PROTEIN: 7.8 g/dL (ref 6.0–8.3)
Total Bilirubin: 0.3 mg/dL (ref 0.3–1.2)

## 2014-09-22 NOTE — ED Provider Notes (Signed)
CSN: [id number]     Arrival date & time 09/22/14  1445 History   First MD Initiated Contact with Patient 09/22/14 1857     Chief Complaint  Patient presents with  . Wound Infection     (Consider location/radiation/quality/duration/timing/severity/associated sxs/prior Treatment) HPI [name] is a 53 year old male with past medical history of GERD, hypertension, diabetes who presents to the ER with a nonhealing wound. Patient reports that she was in Dr. she was office this morning for a amputation of the second toe of his left foot, and states he did not go through with the procedure due to to being unable to pay a co-pay. Patient states he then came to the emergency room for further evaluation. Patient denies having any dizziness, lightheadedness, weakness, fever, chest pain, shortness of breath, abdominal pain, nausea, vomiting, diarrhea, dysuria.  Past Medical History  Diagnosis Date  . GERD (gastroesophageal reflux disease)   . HTN (hypertension)   . Diabetes mellitus    Past Surgical History  Procedure Laterality Date  . Toe amputation    . Amputation Right 12/19/2013    Procedure: TRANSMETATARSAL AMPUTATION RIGHT FOOT WITH INTRAOPERATIVE PERCUTANEOUS HEEL CORD LENGTHENING ;  Surgeon: [name], MD;  Location: [facility];  Service: Orthopedics;  Laterality: Right;   History reviewed. No pertinent family history. History  Substance Use Topics  . Smoking status: Never Smoker   . Smokeless tobacco: Not on file  . Alcohol Use: No    Review of Systems  Musculoskeletal: Positive for arthralgias.  Neurological: Negative for weakness and numbness.      Allergies  Review of patient's allergies indicates no known allergies.  Home Medications   Prior to Admission medications   Medication Sig Start Date End Date Taking? Authorizing Provider  gabapentin (NEURONTIN) 300 MG capsule Take 300 mg by mouth 3 (three) times daily.   Yes Historical Provider, MD  insulin NPH-insulin  regular (NOVOLIN 70/30) (70-30) 100 UNIT/ML injection Inject 20-25 Units into the skin See admin instructions. 25 units in the morning, 20 units at night   Yes Historical Provider, MD  lisinopril (PRINIVIL,ZESTRIL) 20 MG tablet Take 20 mg by mouth daily.   Yes Historical Provider, MD   BP 190/109  Pulse 65  Temp(Src) 98.3 F (36.8 C) (Oral)  Resp 24  SpO2 99% Physical Exam  Nursing note and vitals reviewed. Constitutional: He appears well-developed and well-nourished. No distress.  HENT:  Head: Normocephalic and atraumatic.  Eyes: Conjunctivae are normal. Right eye exhibits no discharge. Left eye exhibits no discharge. No scleral icterus.  Cardiovascular:  Peripheral pulses intact at injured extremity.   Pulmonary/Chest: Effort normal. No respiratory distress.  Musculoskeletal:  1 cm wound noted to the second phalanx. Patient has extremely poor distal sensation, as well as poor peripheral pulses to his feet bilaterally. Multiple wounds on patient's toes in various stages of healing.  Neurological: He is alert.  No numbness distal to injury.    Skin: Skin is warm and dry. No rash noted. He is not diaphoretic.    ED Course  Procedures (including critical care time) Labs Review Labs Reviewed  CBC WITH DIFFERENTIAL - Abnormal; Notable for the following:    Hemoglobin 10.5 (*)    HCT 33.8 (*)    MCV 74.0 (*)    MCH 23.0 (*)    All other components within normal limits  COMPREHENSIVE METABOLIC PANEL - Abnormal; Notable for the following:    Glucose, Bld 180 (*)    BUN 45 (*)  Creatinine, Ser 2.94 (*)    Albumin 3.3 (*)    GFR calc non Af Amer 22 (*)    GFR calc Af Amer 26 (*)    All other components within normal limits  I-STAT CG4 LACTIC ACID, ED    Imaging Review Dg Foot Complete Left  09/22/2014   CLINICAL DATA:  Left foot wound for 4 months  EXAM: LEFT FOOT - COMPLETE 3+ VIEW  COMPARISON:  06/25/2013, 03/22/2009  FINDINGS: Postsurgical changes are noted in the distal  aspect of the fourth metatarsal bone similar to that seen on the prior exam. Some foreshortening of the second distal phalanx is noted which has progressed in the interval from the most recent study. No acute bony destruction to suggest osteomyelitis is identified at this time. Diffuse vascular calcifications are seen. No acute fracture is noted.  IMPRESSION: Chronic changes without acute abnormality.   Electronically Signed   By: Inez Catalina M.D.   On: 09/22/2014 15:38     EKG Interpretation None      MDM   Final diagnoses:  Necrosis of toe  Osteomyelitis    53 year old male with past medical history diabetes, foot amputation on right foot here with wound to second phalanx of left foot. Patient was being seen by Dr. Doran Durand today for an amputation of his second toe of his left foot, however was turned away by the surgical center do to owing a balance to them. Dr. Doran Durand instructed patient to follow up with him in the following week due to this amputation not being an emergent situation. Dr. Doran Durand  instructed patient that using a different surgical center they could work with patient to set up payment plan so that he could undergo the procedure without any difficulty financially. Patient then came to the emergency room to see if there is anything that we would be able to do here. We were able to contact Dr. Doran Durand who reiterated the fact that there is currently a plan in place regarding the care of patient's osteomyelitis. Dr. Doran Durand recommended to discharge patient home as long as there were no signs of sepsis or systemic infection to followup with him in the next week. During his stay in the ER patient has shown no signs of leukocytosis, as lactic acid was at a normal value, left foot radiographs were unremarkable for any worsening of his disease process, patient's electrolytes were in within normal limits, BUN and creatinine are elevated, however at baseline for patient. Patient has remained  asymptomatic while in the ER. Sepsis and systemic infection were considered, however patient has no signs or symptoms to suggest that it is present. We will discharge patient at this time and strongly encouraged him to follow Dr. Nona Dell instructions regarding his foot. We discussed return precautions with patient, and encourage him to call or return to the ER should he develop any symptoms, or should he have any questions or concerns.  BP 190/109  Pulse 65  Temp(Src) 98.3 F (36.8 C) (Oral)  Resp 24  SpO2 99%  Signed,  Dahlia Bailiff, PA-C 2:08 AM  This patient seen and discussed with Dr. Debby Freiberg, M.D.    Carrie Mew, PA-C 09/23/14 [phone]

## 2014-09-22 NOTE — ED Notes (Signed)
Paged Dr. Doran Durand to [phone]

## 2014-09-22 NOTE — ED Notes (Addendum)
Pt in stating he was supposed to have his left second toe removed this morning due to infection, but states he wasn't able to get the co-pay, in today due to continued pain and states he is starting to feel worse, denies fever. Pt states he had all of his toes on his right foot removed last year and doesn't want the infection to spread and loose all of his left toes also.

## 2014-09-22 NOTE — Discharge Instructions (Signed)
Bone and Joint Infections °Joint infections are called septic or infectious arthritis. An infected joint may damage cartilage and tissue very quickly. This may destroy the joint. Bone infections (osteomyelitis) may last for years. Joints may become stiff if left untreated. Bacteria are the most common cause. Other causes include viruses and fungi, but these are more rare. Bone and joint infections usually come from injury or infection elsewhere in your body; the germs are carried to your bones or joints through the bloodstream.  °CAUSES  °· Blood-carried germs from an infection elsewhere in your body can eventually spread to a bone or joint. The germ staphylococcus is the most common cause of both osteomyelitis and septic arthritis. °· An injury can introduce germs into your bones or joints. °SYMPTOMS  °· Weight loss. °· Tiredness. °· Chills and fever. °· Bone or joint pain at rest and with activity. °· Tenderness when touching the area or bending the joint. °· Refusal to bear weight on a leg or inability to use an arm due to pain. °· Decreased range of motion in a joint. °· Skin redness, warmth, and tenderness. °· Open skin sores and drainage. °RISK FACTORS °Children, the elderly, and those with weak immune systems are at increased risk of bone and joint infections. It is more common in people with HIV infections and with people on chemotherapy. People are also at increased risk if they have surgery where metal implants are used to stabilize the bone. Plates, screws, or artificial joints provide a surface that bacteria can stick on. Such a growth of bacteria is called biofilm. The biofilm protects bacteria from antibiotics and bodily defenses. This allows germs to multiply. Other reasons for increased risks include:  °· Having previous surgery or injury of a bone or joint. °· Being on high-dose corticosteroids and immunosuppressive medications that weaken your body's resistance to germs. °· Diabetes and  long-standing diseases. °· Use of intravenous street drugs. °· Being on hemodialysis. °· Having a history of urinary tract infections. °· Removal of your spleen (splenectomy). This weakens your immunity. °· Chronic viral infections such as HIV or AIDS. °· Lack of sensation such as paraplegia, quadriplegia, or spina bifida. °DIAGNOSIS  °· Increased numbers of white blood cells in your blood may indicate infection. Some times your caregivers are able to identify the infecting germs by testing your blood. Inflammatory markers present in your bloodstream such as an erythrocyte sedimentation rate (ESR or sed rate) or c-reactive protein (CRP) can be indicators of deep infection. °· Bone scans and X-ray exams are necessary for diagnosing osteomyelitis. They may help your caregiver find the infected areas. Other studies may give more detailed information. They may help detect fluid collections around a joint, abnormal bone surfaces, or be useful in diagnosing septic arthritis. They can find soft tissue swelling and find excess fluid in an infected joint or the adjacent bone. These tests include: °¨ Ultrasound. °¨ CT (computerized tomography). °¨ MRI (magnetic resonance imaging). °· The best test for diagnosing a bone or joint infection is an aspiration or biopsy. Your caregiver will usually use a local anesthetic. He or she can then remove tissue from a bone injury or use a needle to take fluids from an infected joint. A local anesthetic medication numbs the area to be biopsied. Often biopsies are done in the operating room under general anesthesia. This means you will be asleep during the procedure. Tests performed on these samples can identify an infection. °TREATMENT  °· Treatment can help control long-standing   infections, but infections may come back.  Infections can infect any bone or joint at any age.  Bone and joint infections are rarely fatal.  Bone infection left untreated can become a never-ending infection.  It can spread to other areas of your body. It may eventually cause bone death. Reduced limb or joint function can result. In severe cases, this may require removal of a limb. Spinal osteomyelitis is very dangerous. Untreated, it may damage spinal nerves and cause death.  The most common complication of septic arthritis is osteoarthritis with pain and decreased range of motion of the joint. Some forms of treatment may include:  If the infection is caused by bacteria, it is generally treated with antibiotics. You will likely receive the drugs through a vein (intravenously) for anywhere from 2 to 6 weeks. In some cases, especially with children, oral antibiotics following an initial intravenous dose may be effective. The treatment you receive depends on the:  Type of bacteria.  Location of the infection.  Type of surgery that might be done.  Other health conditions or issues you might have.  Your caregiver may drain soft tissue abscesses or pockets of fluid around infected bones or joints. If you have septic arthritis, your caregiver may use a needle to drain pus from the joint on a daily basis. He or she may use an arthroscope to clean the joint or may need to open the joint surgically to remove damaged tissue and infection. An arthroscope is an instrument like a thin lighted telescope. It can be used to look inside the joint.  Surgery is usually needed if the infection has become long-standing. It may also be needed if there is hardware (such as metal plates, screws, or artificial joints) inside the patient. Sometimes a bone or muscle graft is needed to fill in the open space. This promotes growth of new tissues and better blood flow to the area. PREVENTION   Clean and disinfect wounds quickly to help prevent the start of a bone or joint infection. Get treatment for any infections to prevent spread to a bone or joint.  Do not smoke. Smoking decreases healing rates of bone and predisposes to  infection.  When given medications that suppress your immune system, use them according to your caregiver's instructions. Do not take more than prescribed for your condition.  Take good care of your feet and skin, especially if you have diabetes, decreased sensation or circulation problems. SEEK IMMEDIATE MEDICAL CARE IF:   You cannot bear weight on a leg or use an arm, especially following a minor injury. This can be a sign of bone or joint infection.  You think you may have signs or symptoms of a bone or joint infection. Your chance of getting rid of an infection is better if treated early. Document Released: 11/20/2005 Document Revised: 02/12/2012 Document Reviewed: 10/20/2009 [facility] Patient Information 2015 [place], [place]. This information is not intended to replace advice given to you by your health care provider. Make sure you discuss any questions you have with your health care provider.

## 2014-09-24 NOTE — ED Provider Notes (Signed)
Medical screening examination/treatment/procedure(s) were conducted as a shared visit with non-physician practitioner(s) and myself.  I personally evaluated the patient during the encounter.   EKG Interpretation None      Briefly, pt is a 53 y.o. male presenting with chronic L toe wound.  I performed an examination on the patient including cardiac, pulmonary, and gi systems which were unremarkable.  Examination of foot demonstrated chronic appearing wound without surrounding erythema, purulence, or malodor.  We spoke with his orthopedic surgeon and apparently he was to have partial amputation but was unable to pay for this procedure.  The pt states he returned today because he thought he could get the surgery today.  He has fu next week to have the operation performed and to set up a payment plan with his surgeon.  Feel this plan is appropriate.  DC home in stable condition.     Debby Freiberg, MD 09/24/14 [phone]

## 2014-09-25 ENCOUNTER — Encounter (HOSPITAL_BASED_OUTPATIENT_CLINIC_OR_DEPARTMENT_OTHER): Payer: Self-pay | Admitting: *Deleted

## 2014-09-28 NOTE — Pre-Procedure Instructions (Signed)
EKG received from PCP; was done 04/2013; pt. notified to come for EKG prior to surgery.

## 2014-09-28 NOTE — Pre-Procedure Instructions (Signed)
History of chronic kidney disease discussed with Dr. Al Corpus; pt. OK to come for surgery.  Last EKG requested from [facility] [phone].

## 2014-09-29 ENCOUNTER — Encounter (HOSPITAL_BASED_OUTPATIENT_CLINIC_OR_DEPARTMENT_OTHER)
Admission: RE | Admit: 2014-09-29 | Discharge: 2014-09-29 | Disposition: A | Discharge status: Home / Self Care | Payer: Managed Care, Other (non HMO) | Source: Ambulatory Visit | Attending: Orthopedic Surgery | Admitting: Orthopedic Surgery

## 2014-09-29 ENCOUNTER — Encounter (HOSPITAL_BASED_OUTPATIENT_CLINIC_OR_DEPARTMENT_OTHER): Payer: Self-pay | Admitting: *Deleted

## 2014-09-29 DIAGNOSIS — N189 Chronic kidney disease, unspecified: Secondary | ICD-10-CM | POA: Diagnosis not present

## 2014-09-29 DIAGNOSIS — M86672 Other chronic osteomyelitis, left ankle and foot: Secondary | ICD-10-CM | POA: Diagnosis present

## 2014-09-29 DIAGNOSIS — E1369 Other specified diabetes mellitus with other specified complication: Secondary | ICD-10-CM | POA: Diagnosis not present

## 2014-09-29 DIAGNOSIS — Z794 Long term (current) use of insulin: Secondary | ICD-10-CM | POA: Diagnosis not present

## 2014-09-30 ENCOUNTER — Other Ambulatory Visit: Payer: Self-pay | Admitting: Physician Assistant

## 2014-10-01 ENCOUNTER — Ambulatory Visit (HOSPITAL_BASED_OUTPATIENT_CLINIC_OR_DEPARTMENT_OTHER): Payer: Managed Care, Other (non HMO) | Admitting: Certified Registered"

## 2014-10-01 ENCOUNTER — Encounter (HOSPITAL_BASED_OUTPATIENT_CLINIC_OR_DEPARTMENT_OTHER): Payer: Self-pay | Admitting: *Deleted

## 2014-10-01 ENCOUNTER — Encounter (HOSPITAL_BASED_OUTPATIENT_CLINIC_OR_DEPARTMENT_OTHER): Payer: Managed Care, Other (non HMO) | Admitting: Certified Registered"

## 2014-10-01 ENCOUNTER — Encounter (HOSPITAL_BASED_OUTPATIENT_CLINIC_OR_DEPARTMENT_OTHER)
Admission: RE | Disposition: A | Discharge status: Home / Self Care | Payer: Self-pay | Source: Ambulatory Visit | Attending: Orthopedic Surgery

## 2014-10-01 ENCOUNTER — Ambulatory Visit (HOSPITAL_BASED_OUTPATIENT_CLINIC_OR_DEPARTMENT_OTHER)
Admission: RE | Admit: 2014-10-01 | Discharge: 2014-10-01 | Disposition: A | Discharge status: Home / Self Care | Payer: Managed Care, Other (non HMO) | Source: Ambulatory Visit | Attending: Orthopedic Surgery | Admitting: Orthopedic Surgery

## 2014-10-01 DIAGNOSIS — N189 Chronic kidney disease, unspecified: Secondary | ICD-10-CM | POA: Insufficient documentation

## 2014-10-01 DIAGNOSIS — E1369 Other specified diabetes mellitus with other specified complication: Secondary | ICD-10-CM | POA: Diagnosis not present

## 2014-10-01 DIAGNOSIS — M86672 Other chronic osteomyelitis, left ankle and foot: Secondary | ICD-10-CM | POA: Insufficient documentation

## 2014-10-01 DIAGNOSIS — Z794 Long term (current) use of insulin: Secondary | ICD-10-CM | POA: Insufficient documentation

## 2014-10-01 DIAGNOSIS — M869 Osteomyelitis, unspecified: Secondary | ICD-10-CM

## 2014-10-01 HISTORY — DX: Long term (current) use of insulin: Z79.4

## 2014-10-01 HISTORY — DX: Presence of dental prosthetic device (complete) (partial): Z97.2

## 2014-10-01 HISTORY — DX: Reserved for inherently not codable concepts without codable children: IMO0001

## 2014-10-01 HISTORY — DX: Type 2 diabetes mellitus without complications: E11.9

## 2014-10-01 HISTORY — DX: Osteomyelitis, unspecified: M86.9

## 2014-10-01 HISTORY — DX: Chronic kidney disease, stage 4 (severe): N18.4

## 2014-10-01 HISTORY — DX: Type 2 diabetes mellitus with diabetic neuropathy, unspecified: E11.40

## 2014-10-01 HISTORY — PX: AMPUTATION: SHX166

## 2014-10-01 LAB — GLUCOSE, CAPILLARY
GLUCOSE-CAPILLARY: 132 mg/dL — AB (ref 70–99)
Glucose-Capillary: 125 mg/dL — ABNORMAL HIGH (ref 70–99)

## 2014-10-01 LAB — POCT HEMOGLOBIN-HEMACUE: HEMOGLOBIN: 10.8 g/dL — AB (ref 13.0–17.0)

## 2014-10-01 SURGERY — AMPUTATION, FOOT, RAY
Anesthesia: General | Site: Toe | Laterality: Left

## 2014-10-01 MED ORDER — MIDAZOLAM HCL 2 MG/2ML IJ SOLN
INTRAMUSCULAR | Status: AC
  Filled 2014-10-01: qty 2

## 2014-10-01 MED ORDER — CHLORHEXIDINE GLUCONATE 4 % EX LIQD: 60.0000 mL | Freq: Once | CUTANEOUS | Status: DC

## 2014-10-01 MED ORDER — ONDANSETRON HCL 4 MG/2ML IJ SOLN: 4.0000 mg | Freq: Once | INTRAMUSCULAR | Status: DC | PRN

## 2014-10-01 MED ORDER — CEFAZOLIN SODIUM-DEXTROSE 2-3 GM-% IV SOLR
INTRAVENOUS | Status: AC
  Filled 2014-10-01: qty 50

## 2014-10-01 MED ORDER — MIDAZOLAM HCL 2 MG/ML PO SYRP: 12.0000 mg | ORAL_SOLUTION | Freq: Once | ORAL | Status: DC | PRN

## 2014-10-01 MED ORDER — ACETAMINOPHEN 500 MG PO TABS
1000.0000 mg | ORAL_TABLET | Freq: Once | ORAL | Status: AC
  Administered 2014-10-01: 1000 mg via ORAL

## 2014-10-01 MED ORDER — LIDOCAINE HCL (CARDIAC) 20 MG/ML IV SOLN
INTRAVENOUS | Status: DC | PRN
  Administered 2014-10-01: 30 mg via INTRAVENOUS

## 2014-10-01 MED ORDER — FENTANYL CITRATE 0.05 MG/ML IJ SOLN: 50.0000 ug | INTRAMUSCULAR | Status: DC | PRN

## 2014-10-01 MED ORDER — HYDROCODONE-ACETAMINOPHEN 5-325 MG PO TABS: 1.0000 | ORAL_TABLET | Freq: Four times a day (QID) | ORAL | Status: DC | PRN

## 2014-10-01 MED ORDER — ACETAMINOPHEN 500 MG PO TABS
ORAL_TABLET | ORAL | Status: AC
  Filled 2014-10-01: qty 2

## 2014-10-01 MED ORDER — ONDANSETRON HCL 4 MG/2ML IJ SOLN
INTRAMUSCULAR | Status: DC | PRN
  Administered 2014-10-01: 4 mg via INTRAVENOUS

## 2014-10-01 MED ORDER — BACITRACIN ZINC 500 UNIT/GM EX OINT
TOPICAL_OINTMENT | CUTANEOUS | Status: DC | PRN
  Administered 2014-10-01: 1 via TOPICAL

## 2014-10-01 MED ORDER — PROPOFOL 10 MG/ML IV BOLUS
INTRAVENOUS | Status: DC | PRN
  Administered 2014-10-01: 200 mg via INTRAVENOUS

## 2014-10-01 MED ORDER — BUPIVACAINE-EPINEPHRINE 0.5% -1:200000 IJ SOLN
INTRAMUSCULAR | Status: DC | PRN
  Administered 2014-10-01: 10 mL

## 2014-10-01 MED ORDER — BACITRACIN ZINC 500 UNIT/GM EX OINT
TOPICAL_OINTMENT | CUTANEOUS | Status: AC
  Filled 2014-10-01: qty 28.35

## 2014-10-01 MED ORDER — LIDOCAINE HCL (PF) 1 % IJ SOLN
INTRAMUSCULAR | Status: AC
  Filled 2014-10-01: qty 30

## 2014-10-01 MED ORDER — LACTATED RINGERS IV SOLN
INTRAVENOUS | Status: DC
  Administered 2014-10-01: 10:00:00 via INTRAVENOUS

## 2014-10-01 MED ORDER — FENTANYL CITRATE 0.05 MG/ML IJ SOLN
25.0000 ug | INTRAMUSCULAR | Status: DC | PRN
  Administered 2014-10-01: 50 ug via INTRAVENOUS

## 2014-10-01 MED ORDER — SODIUM CHLORIDE 0.9 % IV SOLN: INTRAVENOUS | Status: DC

## 2014-10-01 MED ORDER — CEFAZOLIN SODIUM-DEXTROSE 2-3 GM-% IV SOLR
2.0000 g | INTRAVENOUS | Status: AC
  Administered 2014-10-01: 2 g via INTRAVENOUS

## 2014-10-01 MED ORDER — FENTANYL CITRATE 0.05 MG/ML IJ SOLN
INTRAMUSCULAR | Status: AC
  Filled 2014-10-01: qty 2

## 2014-10-01 MED ORDER — MIDAZOLAM HCL 2 MG/2ML IJ SOLN: 1.0000 mg | INTRAMUSCULAR | Status: DC | PRN

## 2014-10-01 MED ORDER — BUPIVACAINE-EPINEPHRINE (PF) 0.5% -1:200000 IJ SOLN
INTRAMUSCULAR | Status: AC
  Filled 2014-10-01: qty 30

## 2014-10-01 MED ORDER — DEXAMETHASONE SODIUM PHOSPHATE 10 MG/ML IJ SOLN
INTRAMUSCULAR | Status: DC | PRN
  Administered 2014-10-01: 4 mg via INTRAVENOUS

## 2014-10-01 MED ORDER — FENTANYL CITRATE 0.05 MG/ML IJ SOLN
INTRAMUSCULAR | Status: AC
  Filled 2014-10-01: qty 4

## 2014-10-01 SURGICAL SUPPLY — 62 items
BLADE AVERAGE 25X9 (BLADE) IMPLANT
BLADE OSC/SAG .038X5.5 CUT EDG (BLADE) IMPLANT
BLADE SURG 10 STRL SS (BLADE) ×2 IMPLANT
BLADE SURG 15 STRL LF DISP TIS (BLADE) ×1 IMPLANT
BLADE SURG 15 STRL SS (BLADE) ×1
BNDG COHESIVE 4X5 TAN STRL (GAUZE/BANDAGES/DRESSINGS) ×2 IMPLANT
BNDG CONFORM 3 STRL LF (GAUZE/BANDAGES/DRESSINGS) ×2 IMPLANT
BNDG ESMARK 4X9 LF (GAUZE/BANDAGES/DRESSINGS) ×2 IMPLANT
CHLORAPREP W/TINT 26ML (MISCELLANEOUS) ×2 IMPLANT
CORDS BIPOLAR (ELECTRODE) IMPLANT
COVER BACK TABLE 60X90IN (DRAPES) ×2 IMPLANT
DECANTER SPIKE VIAL GLASS SM (MISCELLANEOUS) IMPLANT
DRAPE EXTREMITY TIBURON (DRAPES) ×2 IMPLANT
DRAPE OEC MINIVIEW 54X84 (DRAPES) IMPLANT
DRAPE SURG 17X23 STRL (DRAPES) ×2 IMPLANT
DRAPE U-SHAPE 47X51 STRL (DRAPES) IMPLANT
DRSG EMULSION OIL 3X3 NADH (GAUZE/BANDAGES/DRESSINGS) ×2 IMPLANT
DRSG PAD ABDOMINAL 8X10 ST (GAUZE/BANDAGES/DRESSINGS) IMPLANT
ELECT REM PT RETURN 9FT ADLT (ELECTROSURGICAL) ×2
ELECTRODE REM PT RTRN 9FT ADLT (ELECTROSURGICAL) ×1 IMPLANT
GAUZE SPONGE 4X4 12PLY STRL (GAUZE/BANDAGES/DRESSINGS) ×2 IMPLANT
GAUZE SPONGE 4X4 16PLY XRAY LF (GAUZE/BANDAGES/DRESSINGS) IMPLANT
GLOVE BIO SURGEON STRL SZ7 (GLOVE) IMPLANT
GLOVE BIO SURGEON STRL SZ8 (GLOVE) ×2 IMPLANT
GLOVE BIOGEL PI IND STRL 7.0 (GLOVE) ×1 IMPLANT
GLOVE BIOGEL PI IND STRL 8 (GLOVE) ×1 IMPLANT
GLOVE BIOGEL PI INDICATOR 7.0 (GLOVE) ×1
GLOVE BIOGEL PI INDICATOR 8 (GLOVE) ×1
GLOVE ECLIPSE 6.5 STRL STRAW (GLOVE) ×2 IMPLANT
GLOVE EXAM NITRILE MD LF STRL (GLOVE) ×2 IMPLANT
GOWN STRL REUS W/ TWL LRG LVL3 (GOWN DISPOSABLE) ×1 IMPLANT
GOWN STRL REUS W/ TWL XL LVL3 (GOWN DISPOSABLE) ×1 IMPLANT
GOWN STRL REUS W/TWL LRG LVL3 (GOWN DISPOSABLE) ×1
GOWN STRL REUS W/TWL XL LVL3 (GOWN DISPOSABLE) ×1
NDL SAFETY ECLIPSE 18X1.5 (NEEDLE) IMPLANT
NEEDLE HYPO 18GX1.5 SHARP (NEEDLE)
NEEDLE HYPO 25X1 1.5 SAFETY (NEEDLE) ×2 IMPLANT
NS IRRIG 1000ML POUR BTL (IV SOLUTION) ×2 IMPLANT
PACK BASIN DAY SURGERY FS (CUSTOM PROCEDURE TRAY) ×2 IMPLANT
PAD CAST 4YDX4 CTTN HI CHSV (CAST SUPPLIES) ×1 IMPLANT
PADDING CAST ABS 4INX4YD NS (CAST SUPPLIES) ×1
PADDING CAST ABS COTTON 4X4 ST (CAST SUPPLIES) ×1 IMPLANT
PADDING CAST COTTON 4X4 STRL (CAST SUPPLIES) ×1
PENCIL BUTTON HOLSTER BLD 10FT (ELECTRODE) ×2 IMPLANT
SANITIZER HAND PURELL 535ML FO (MISCELLANEOUS) ×2 IMPLANT
SHEET MEDIUM DRAPE 40X70 STRL (DRAPES) ×2 IMPLANT
SLEEVE SCD COMPRESS KNEE MED (MISCELLANEOUS) ×2 IMPLANT
SPONGE LAP 18X18 X RAY DECT (DISPOSABLE) ×2 IMPLANT
STOCKINETTE 6  STRL (DRAPES) ×1
STOCKINETTE 6 STRL (DRAPES) ×1 IMPLANT
SUCTION FRAZIER TIP 10 FR DISP (SUCTIONS) IMPLANT
SUT ETHILON 2 0 FS 18 (SUTURE) ×2 IMPLANT
SUT ETHILON 2 0 FSLX (SUTURE) IMPLANT
SUT ETHILON 3 0 PS 1 (SUTURE) IMPLANT
SUT MNCRL AB 3-0 PS2 18 (SUTURE) IMPLANT
SWAB COLLECTION DEVICE MRSA (MISCELLANEOUS) IMPLANT
SYR BULB 3OZ (MISCELLANEOUS) ×2 IMPLANT
SYR CONTROL 10ML LL (SYRINGE) ×2 IMPLANT
TOWEL OR 17X24 6PK STRL BLUE (TOWEL DISPOSABLE) ×2 IMPLANT
TOWEL OR NON WOVEN STRL DISP B (DISPOSABLE) ×2 IMPLANT
TUBE CONNECTING 20X1/4 (TUBING) IMPLANT
UNDERPAD 30X30 INCONTINENT (UNDERPADS AND DIAPERS) ×2 IMPLANT

## 2014-10-01 NOTE — Op Note (Signed)
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  PROCEDURE:  Left second toe amputation through the metatarsophalangeal joint.  SURGEON:  [name], MD  ANESTHESIA:  General.  ESTIMATED BLOOD LOSS:  Minimal.  TOURNIQUET TIME:  Approximately 10 minutes with an ankle Esmarch.  COMPLICATIONS:  None apparent.  DISPOSITION:  Extubated, awake and stable to recovery.  INDICATIONS FOR PROCEDURE:  The patient is a 53 year old male with past medical history significant for diabetes and peripheral neuropathy.  He is status post right foot transmetatarsal amputation for chronic ulcers and osteomyelitis.  On the left foot, he has had a dorsal ulcer on the second toe for many months.  He has intermittently had purulent drainage.  Radiographs now show interval lysis of the head of the proximal phalanx.  He presents today for amputation of the second toe. He understands the risks and benefits, the alternative treatment options, and elects surgical treatment.  He specifically understands risks of bleeding, infection, nerve damage, blood clots, need for additional surgery, continued pain, amputation, and death.  PROCEDURE IN DETAIL:  After preoperative consent was obtained and the correct operative site was identified, the patient was brought to the operating room and placed supine on the operating table.  General anesthesia was induced.  Preoperative antibiotics were administered. Surgical time-out was taken.  Left lower extremity was prepped and draped in  standard sterile fashion.  A 4-inch Esmarch was used to exsanguinate the foot and was wrapped around the ankle as a tourniquet. A plantar flap incision was marked on the skin at the base of the second toe.  The incision was made and sharp dissection was carried down through skin and subcutaneous tissue to the level of the proximal phalanx.  Subperiosteal dissection was then carried down to the MTP joint and the toe was disarticulated through the MTP joint.  It was passed off the field.  The neurovascular bundles were cauterized, wound was irrigated copiously.  The remaining soft tissue appeared generally healthy.  Wound was then closed with horizontal mattress sutures of 2-0 nylon.  Sterile dressings were applied followed by compression wrap. The patient was noted to also have thickened callus at the plantar medial aspect of the hallux.  This was pared down with a #10 blade to the level of the surrounding tissue.  No evidence of any ulceration was noted.  The patient was then awakened from anesthesia and transported to the recovery room in stable condition.  FOLLOWUP PLAN:  The patient will be weightbearing as tolerated on his left foot in a flat postop shoe.  He will follow up with me in 2 weeks for suture removal.     [name], MD  JH/MEDQ  D:  10/01/2014  T:  10/01/2014  Job:  [id number]

## 2014-10-01 NOTE — Anesthesia Postprocedure Evaluation (Signed)
  Anesthesia Post-op Note  Patient: [name]  Procedure(s) Performed: Procedure(s): LEFT SECOND TOE AMPUTATION THROUGH THE PROXIMAL INTERPHALANGEAL JOINT   (Left)  Patient Location: PACU  Anesthesia Type: General   Level of Consciousness: awake, alert  and oriented  Airway and Oxygen Therapy: Patient Spontanous Breathing  Post-op Pain: mild  Post-op Assessment: Post-op Vital signs reviewed  Post-op Vital Signs: Reviewed  Last Vitals:  Filed Vitals:   10/01/14 1155  BP: 168/81  Pulse: 71  Temp: 36.4 C  Resp: 16    Complications: No apparent anesthesia complications

## 2014-10-01 NOTE — Anesthesia Procedure Notes (Signed)
Procedure Name: LMA Insertion Date/Time: 10/01/2014 9:54 AM Performed by: Kerrick Miler Pre-anesthesia Checklist: Patient identified, Emergency Drugs available, Suction available and Patient being monitored Patient Re-evaluated:Patient Re-evaluated prior to inductionOxygen Delivery Method: Circle System Utilized Preoxygenation: Pre-oxygenation with 100% oxygen Intubation Type: IV induction Ventilation: Mask ventilation without difficulty LMA: LMA inserted LMA Size: 5.0 Number of attempts: 1 Airway Equipment and Method: bite block Placement Confirmation: positive ETCO2 Tube secured with: Tape Dental Injury: Teeth and Oropharynx as per pre-operative assessment
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
## 2014-10-01 NOTE — Anesthesia Preprocedure Evaluation (Signed)
Anesthesia Evaluation  Patient identified by MRN, date of birth, ID band Patient awake    Reviewed: Allergy & Precautions, H&P , NPO status , Patient's Chart, lab work & pertinent test results  Airway Mallampati: I  TM Distance: >3 FB Neck ROM: Full    Dental  (+) Teeth Intact, Partial Upper, Dental Advisory Given   Pulmonary  breath sounds clear to auscultation        Cardiovascular hypertension, Pt. on medications Rhythm:Regular Rate:Normal     Neuro/Psych    GI/Hepatic   Endo/Other  diabetes, Well Controlled, Type 2, Oral Hypoglycemic Agents  Renal/GU      Musculoskeletal   Abdominal   Peds  Hematology   Anesthesia Other Findings   Reproductive/Obstetrics                             Anesthesia Physical Anesthesia Plan  ASA: III  Anesthesia Plan: General   Post-op Pain Management:    Induction: Intravenous  Airway Management Planned: LMA  Additional Equipment:   Intra-op Plan:   Post-operative Plan: Extubation in OR  Informed Consent: I have reviewed the patients History and Physical, chart, labs and discussed the procedure including the risks, benefits and alternatives for the proposed anesthesia with the patient or authorized representative who has indicated his/her understanding and acceptance.   Dental advisory given  Plan Discussed with: Anesthesiologist, CRNA and Surgeon  Anesthesia Plan Comments:         Anesthesia Quick Evaluation

## 2014-10-01 NOTE — Brief Op Note (Signed)
10/01/2014  10:23 AM  PATIENT:  [name]  53 y.o. male  PRE-OPERATIVE DIAGNOSIS:  LEFT SECOND TOE OSTEOMYLITIS and chronic diabetic ulcer  POST-OPERATIVE DIAGNOSIS:  same  Procedure(s): LEFT SECOND TOE AMPUTATION THROUGH THE MTP joint    SURGEON:  [name], MD  ASSISTANT: n/a  ANESTHESIA:   General  EBL:  minimal   TOURNIQUET:  approx 10 min with an ankle esmarch  COMPLICATIONS:  None apparent  DISPOSITION:  Extubated, awake and stable to recovery.  DICTATION ID:  [id number]

## 2014-10-01 NOTE — Discharge Instructions (Signed)
[name] Simmer, MD [name]  Please read the following information regarding your care after surgery.  Medications  You only need a prescription for the narcotic pain medicine (ex. oxycodone, Percocet, Norco).  All of the other medicines listed below are available over the counter. X norco as prescribed for pain  Narcotic pain medicine (ex. oxycodone, Percocet, Vicodin) will cause constipation.  To prevent this problem, take the following medicines while you are taking any pain medicine. X docusate sodium (Colace) 100 mg twice a day X senna (Senokot) 2 tablets twice a day  Weight Bearing X Bear weight when you are able on your operated leg or foot in the post-op shoe.  Cast / Splint / Dressing X Keep your dressing clean and dry.  Dont put anything (coat hanger, pencil, etc) down inside of it.  If it gets damp, use a hair dryer on the cool setting to dry it.  If it gets soaked, call the office to schedule an appointment for a cast change.  After your dressing, cast or splint is removed; you may shower, but do not soak or scrub the wound.  Allow the water to run over it, and then gently pat it dry.  Swelling It is normal for you to have swelling where you had surgery.  To reduce swelling and pain, keep your toes above your nose for at least 3 days after surgery.  It may be necessary to keep your foot or leg elevated for several weeks.  If it hurts, it should be elevated.  Follow Up Call my office at [phone] when you are discharged from the hospital or surgery center to schedule an appointment to be seen two weeks after surgery.  Call my office at [phone] if you develop a fever >101.5 F, nausea, vomiting, bleeding from the surgical site or severe pain.     Post Anesthesia Home Care Instructions  Activity: Get plenty of rest for the remainder of the day. A responsible adult should stay with you for 24 hours following the procedure.  For the next 24 hours, DO  NOT: -Drive a car -Paediatric nurse -Drink alcoholic beverages -Take any medication unless instructed by your physician -Make any legal decisions or sign important papers.  Meals: Start with liquid foods such as gelatin or soup. Progress to regular foods as tolerated. Avoid greasy, spicy, heavy foods. If nausea and/or vomiting occur, drink only clear liquids until the nausea and/or vomiting subsides. Call your physician if vomiting continues.  Special Instructions/Symptoms: Your throat may feel dry or sore from the anesthesia or the breathing tube placed in your throat during surgery. If this causes discomfort, gargle with warm salt water. The discomfort should disappear within 24 hours.   Call your surgeon if you experience:   1.  Fever over 101.0. 2.  Inability to urinate. 3.  Nausea and/or vomiting. 4.  Extreme swelling or bruising at the surgical site. 5.  Continued bleeding from the incision. 6.  Increased pain, redness or drainage from the incision. 7.  Problems related to your pain medication. 8. Any change in color, movement and/or sensation 9. Any problems and/or concerns

## 2014-10-01 NOTE — Transfer of Care (Signed)
Immediate Anesthesia Transfer of Care Note  Patient: [name]  Procedure(s) Performed: Procedure(s) with comments: LEFT SECOND TOE AMPUTATION THROUGH THE PROXIMAL INTERPHALANGEAL JOINT   (Left) - ANESTHESIA: LOCAL/MAC  Patient Location: PACU  Anesthesia Type:General  Level of Consciousness: awake and patient cooperative  Airway & Oxygen Therapy: Patient Spontanous Breathing and Patient connected to face mask oxygen  Post-op Assessment: Report given to PACU RN and Post -op Vital signs reviewed and stable  Post vital signs: Reviewed and stable  Complications: No apparent anesthesia complications

## 2014-10-02 ENCOUNTER — Encounter (HOSPITAL_BASED_OUTPATIENT_CLINIC_OR_DEPARTMENT_OTHER): Payer: Self-pay | Admitting: Orthopedic Surgery

## 2016-02-10 ENCOUNTER — Encounter (INDEPENDENT_AMBULATORY_CARE_PROVIDER_SITE_OTHER): Payer: Self-pay | Admitting: Ophthalmology

## 2016-02-29 ENCOUNTER — Encounter (INDEPENDENT_AMBULATORY_CARE_PROVIDER_SITE_OTHER): Payer: PRIVATE HEALTH INSURANCE | Admitting: Ophthalmology

## 2016-02-29 DIAGNOSIS — H43813 Vitreous degeneration, bilateral: Secondary | ICD-10-CM

## 2016-02-29 DIAGNOSIS — H35033 Hypertensive retinopathy, bilateral: Secondary | ICD-10-CM

## 2016-02-29 DIAGNOSIS — I10 Essential (primary) hypertension: Secondary | ICD-10-CM

## 2016-02-29 DIAGNOSIS — E113313 Type 2 diabetes mellitus with moderate nonproliferative diabetic retinopathy with macular edema, bilateral: Secondary | ICD-10-CM | POA: Diagnosis not present

## 2016-02-29 DIAGNOSIS — E11311 Type 2 diabetes mellitus with unspecified diabetic retinopathy with macular edema: Secondary | ICD-10-CM

## 2016-09-22 ENCOUNTER — Emergency Department (HOSPITAL_COMMUNITY): Payer: No Typology Code available for payment source

## 2016-09-22 ENCOUNTER — Emergency Department (HOSPITAL_COMMUNITY)
Admission: EM | Admit: 2016-09-22 | Discharge: 2016-09-22 | Disposition: A | Discharge status: Home / Self Care | Payer: No Typology Code available for payment source | Attending: Emergency Medicine | Admitting: Emergency Medicine

## 2016-09-22 ENCOUNTER — Encounter (HOSPITAL_COMMUNITY): Payer: Self-pay | Admitting: Emergency Medicine

## 2016-09-22 DIAGNOSIS — Y939 Activity, unspecified: Secondary | ICD-10-CM | POA: Insufficient documentation

## 2016-09-22 DIAGNOSIS — Z794 Long term (current) use of insulin: Secondary | ICD-10-CM | POA: Diagnosis not present

## 2016-09-22 DIAGNOSIS — I129 Hypertensive chronic kidney disease with stage 1 through stage 4 chronic kidney disease, or unspecified chronic kidney disease: Secondary | ICD-10-CM | POA: Diagnosis not present

## 2016-09-22 DIAGNOSIS — S39012A Strain of muscle, fascia and tendon of lower back, initial encounter: Secondary | ICD-10-CM | POA: Diagnosis not present

## 2016-09-22 DIAGNOSIS — Y92410 Unspecified street and highway as the place of occurrence of the external cause: Secondary | ICD-10-CM | POA: Insufficient documentation

## 2016-09-22 DIAGNOSIS — S161XXA Strain of muscle, fascia and tendon at neck level, initial encounter: Secondary | ICD-10-CM | POA: Diagnosis not present

## 2016-09-22 DIAGNOSIS — N184 Chronic kidney disease, stage 4 (severe): Secondary | ICD-10-CM | POA: Insufficient documentation

## 2016-09-22 DIAGNOSIS — Y999 Unspecified external cause status: Secondary | ICD-10-CM | POA: Diagnosis not present

## 2016-09-22 DIAGNOSIS — S199XXA Unspecified injury of neck, initial encounter: Secondary | ICD-10-CM | POA: Diagnosis present

## 2016-09-22 DIAGNOSIS — E1140 Type 2 diabetes mellitus with diabetic neuropathy, unspecified: Secondary | ICD-10-CM | POA: Diagnosis not present

## 2016-09-22 DIAGNOSIS — E1122 Type 2 diabetes mellitus with diabetic chronic kidney disease: Secondary | ICD-10-CM | POA: Diagnosis not present

## 2016-09-22 LAB — CBG MONITORING, ED: GLUCOSE-CAPILLARY: 87 mg/dL (ref 65–99)

## 2016-09-22 MED ORDER — MELOXICAM 15 MG PO TABS: 15.0000 mg | ORAL_TABLET | Freq: Every day | ORAL | 0 refills | Status: DC

## 2016-09-22 MED ORDER — ACETAMINOPHEN 325 MG PO TABS
650.0000 mg | ORAL_TABLET | Freq: Once | ORAL | Status: AC
  Administered 2016-09-22: 650 mg via ORAL
  Filled 2016-09-22: qty 2

## 2016-09-22 MED ORDER — METHOCARBAMOL 500 MG PO TABS: 500.0000 mg | ORAL_TABLET | Freq: Two times a day (BID) | ORAL | 0 refills | Status: DC

## 2016-09-22 NOTE — Discharge Instructions (Signed)
Take Motrin as prescribed for pain and inflammation. Take Robaxin for muscle spasms. Follow up with primary care doctor. Return if any numbness or weakness in extremities, worsening symptoms, new concerning symptoms.

## 2016-09-22 NOTE — ED Notes (Signed)
Pt verbalized understanding discharge instructions and denies any further needs or questions at this time. VS stable, ambulatory and steady gait.   
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
## 2017-02-14 ENCOUNTER — Other Ambulatory Visit: Payer: Self-pay | Admitting: Vascular Surgery

## 2017-02-14 DIAGNOSIS — N185 Chronic kidney disease, stage 5: Secondary | ICD-10-CM

## 2017-03-12 ENCOUNTER — Encounter: Payer: Self-pay | Admitting: Vascular Surgery

## 2017-03-22 ENCOUNTER — Encounter: Payer: Self-pay | Admitting: *Deleted

## 2017-03-22 ENCOUNTER — Encounter: Payer: Self-pay | Admitting: Vascular Surgery

## 2017-03-22 ENCOUNTER — Ambulatory Visit (INDEPENDENT_AMBULATORY_CARE_PROVIDER_SITE_OTHER): Payer: BLUE CROSS/BLUE SHIELD | Admitting: Vascular Surgery

## 2017-03-22 ENCOUNTER — Encounter (HOSPITAL_COMMUNITY): Payer: Self-pay | Admitting: *Deleted

## 2017-03-22 ENCOUNTER — Other Ambulatory Visit: Payer: Self-pay | Admitting: *Deleted

## 2017-03-22 ENCOUNTER — Ambulatory Visit (HOSPITAL_COMMUNITY)
Admission: RE | Admit: 2017-03-22 | Discharge: 2017-03-22 | Disposition: A | Discharge status: Home / Self Care | Payer: BLUE CROSS/BLUE SHIELD | Source: Ambulatory Visit | Attending: Vascular Surgery | Admitting: Vascular Surgery

## 2017-03-22 ENCOUNTER — Ambulatory Visit (INDEPENDENT_AMBULATORY_CARE_PROVIDER_SITE_OTHER)
Admission: RE | Admit: 2017-03-22 | Discharge: 2017-03-22 | Disposition: A | Discharge status: Home / Self Care | Payer: BLUE CROSS/BLUE SHIELD | Source: Ambulatory Visit | Attending: Vascular Surgery | Admitting: Vascular Surgery

## 2017-03-22 VITALS — BP 137/74 | HR 72 | Temp 97.3°F | Resp 16 | Ht 76.0 in | Wt 250.0 lb

## 2017-03-22 DIAGNOSIS — N185 Chronic kidney disease, stage 5: Secondary | ICD-10-CM
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
## 2017-03-22 NOTE — Progress Notes (Signed)
Pt denies SOB, chest pain, and being under the care of a cardiologist. Pt denies having a stress test, echo and cardiac cath. Pt stated that an EKG was performed at [facility] recently; records requested. Pt made aware to stop taking  Vitamins, fish oil and herbal medications. Do not take any NSAIDs ie: Ibuprofen, Advil, Naproxen, BC and Goody Powder. Pt stated that MD advised that he takes only half dose of Humulin 70/30 the night before procedure. Pt made aware to take no insulin the morning of procedure. Pt made aware to check BG every 2 hours prior to arrival to hospital, drink 4 ounces of apple juice to treat a BG < 70, wait 15 minutes after drinking juice and recheck BG, if BG remains < 70 call SS unit and if it is > 220 call as well. Pt verbalized understanding of all pre-op instructions.

## 2017-03-22 NOTE — Progress Notes (Signed)
   03/22/17 1657  OBSTRUCTIVE SLEEP APNEA  Have you ever been diagnosed with sleep apnea through a sleep study? No  Do you snore loudly (loud enough to be heard through closed doors)?  1  Do you often feel tired, fatigued, or sleepy during the daytime (such as falling asleep during driving or talking to someone)? 0  Has anyone observed you stop breathing during your sleep? 0  Do you have, or are you being treated for high blood pressure? 1  BMI more than 35 kg/m2? 0  Age > 50 (1-yes) 1  Neck circumference greater than:Male 16 inches or larger, Male 17inches or larger? 1  Male Gender (Yes=1) 1  Obstructive Sleep Apnea Score 5

## 2017-03-26 ENCOUNTER — Encounter: Payer: Self-pay | Admitting: Nephrology

## 2017-03-27 ENCOUNTER — Encounter (HOSPITAL_COMMUNITY)
Admission: RE | Disposition: A | Discharge status: Home / Self Care | Payer: Self-pay | Source: Ambulatory Visit | Attending: Vascular Surgery

## 2017-03-27 ENCOUNTER — Ambulatory Visit (HOSPITAL_COMMUNITY): Payer: BLUE CROSS/BLUE SHIELD | Admitting: Anesthesiology

## 2017-03-27 ENCOUNTER — Telehealth: Payer: Self-pay | Admitting: Vascular Surgery

## 2017-03-27 ENCOUNTER — Encounter (HOSPITAL_COMMUNITY): Payer: Self-pay | Admitting: Anesthesiology

## 2017-03-27 ENCOUNTER — Ambulatory Visit (HOSPITAL_COMMUNITY)
Admission: RE | Admit: 2017-03-27 | Discharge: 2017-03-27 | Disposition: A | Discharge status: Home / Self Care | Payer: BLUE CROSS/BLUE SHIELD | Source: Ambulatory Visit | Attending: Vascular Surgery | Admitting: Vascular Surgery

## 2017-03-27 DIAGNOSIS — E1140 Type 2 diabetes mellitus with diabetic neuropathy, unspecified: Secondary | ICD-10-CM | POA: Insufficient documentation

## 2017-03-27 DIAGNOSIS — Z9049 Acquired absence of other specified parts of digestive tract: Secondary | ICD-10-CM | POA: Insufficient documentation

## 2017-03-27 DIAGNOSIS — E1122 Type 2 diabetes mellitus with diabetic chronic kidney disease: Secondary | ICD-10-CM | POA: Diagnosis not present

## 2017-03-27 DIAGNOSIS — Z89431 Acquired absence of right foot: Secondary | ICD-10-CM | POA: Diagnosis not present

## 2017-03-27 DIAGNOSIS — N185 Chronic kidney disease, stage 5: Secondary | ICD-10-CM | POA: Diagnosis not present

## 2017-03-27 DIAGNOSIS — N186 End stage renal disease: Secondary | ICD-10-CM | POA: Diagnosis not present

## 2017-03-27 DIAGNOSIS — I120 Hypertensive chronic kidney disease with stage 5 chronic kidney disease or end stage renal disease: Secondary | ICD-10-CM | POA: Insufficient documentation

## 2017-03-27 DIAGNOSIS — Z89422 Acquired absence of other left toe(s): Secondary | ICD-10-CM | POA: Insufficient documentation

## 2017-03-27 DIAGNOSIS — Z87891 Personal history of nicotine dependence: Secondary | ICD-10-CM | POA: Insufficient documentation

## 2017-03-27 DIAGNOSIS — Z794 Long term (current) use of insulin: Secondary | ICD-10-CM | POA: Insufficient documentation

## 2017-03-27 DIAGNOSIS — Z79899 Other long term (current) drug therapy: Secondary | ICD-10-CM | POA: Diagnosis not present

## 2017-03-27 HISTORY — DX: Accidental discharge from unspecified firearms or gun, initial encounter: W34.00XA

## 2017-03-27 HISTORY — PX: AV FISTULA PLACEMENT: SHX1204

## 2017-03-27 LAB — GLUCOSE, CAPILLARY: GLUCOSE-CAPILLARY: 170 mg/dL — AB (ref 65–99)

## 2017-03-27 LAB — POCT I-STAT 4, (NA,K, GLUC, HGB,HCT)
Glucose, Bld: 140 mg/dL — ABNORMAL HIGH (ref 65–99)
HCT: 29 % — ABNORMAL LOW (ref 39.0–52.0)
Hemoglobin: 9.9 g/dL — ABNORMAL LOW (ref 13.0–17.0)
Potassium: 4.3 mmol/L (ref 3.5–5.1)
Sodium: 139 mmol/L (ref 135–145)

## 2017-03-27 SURGERY — ARTERIOVENOUS (AV) FISTULA CREATION
Anesthesia: Monitor Anesthesia Care | Site: Arm Lower | Laterality: Left

## 2017-03-27 MED ORDER — ONDANSETRON HCL 4 MG/2ML IJ SOLN
INTRAMUSCULAR | Status: DC | PRN
  Administered 2017-03-27: 4 mg via INTRAVENOUS

## 2017-03-27 MED ORDER — LIDOCAINE HCL 1 % IJ SOLN
INTRAMUSCULAR | Status: AC
  Filled 2017-03-27: qty 20

## 2017-03-27 MED ORDER — FENTANYL CITRATE (PF) 100 MCG/2ML IJ SOLN: 25.0000 ug | INTRAMUSCULAR | Status: DC | PRN

## 2017-03-27 MED ORDER — MIDAZOLAM HCL 2 MG/2ML IJ SOLN
INTRAMUSCULAR | Status: AC
  Filled 2017-03-27: qty 2

## 2017-03-27 MED ORDER — DEXTROSE 5 % IV SOLN
1.5000 g | INTRAVENOUS | Status: AC
  Administered 2017-03-27: 08:00:00 via INTRAVENOUS
  Administered 2017-03-27: 1.5 g via INTRAVENOUS
  Filled 2017-03-27 (×2): qty 1.5

## 2017-03-27 MED ORDER — MIDAZOLAM HCL 5 MG/5ML IJ SOLN
INTRAMUSCULAR | Status: DC | PRN
  Administered 2017-03-27 (×2): 1 mg via INTRAVENOUS

## 2017-03-27 MED ORDER — FENTANYL CITRATE (PF) 100 MCG/2ML IJ SOLN
INTRAMUSCULAR | Status: DC | PRN
  Administered 2017-03-27: 50 ug via INTRAVENOUS

## 2017-03-27 MED ORDER — CARVEDILOL 25 MG PO TABS
25.0000 mg | ORAL_TABLET | Freq: Once | ORAL | Status: DC
  Filled 2017-03-27: qty 1

## 2017-03-27 MED ORDER — SODIUM CHLORIDE 0.9 % IV SOLN
INTRAVENOUS | Status: DC | PRN
  Administered 2017-03-27: 07:00:00 500 mL

## 2017-03-27 MED ORDER — ONDANSETRON HCL 4 MG/2ML IJ SOLN: 4.0000 mg | Freq: Once | INTRAMUSCULAR | Status: DC | PRN

## 2017-03-27 MED ORDER — HEPARIN SODIUM (PORCINE) 1000 UNIT/ML IJ SOLN
INTRAMUSCULAR | Status: DC | PRN
  Administered 2017-03-27: 5000 [IU] via INTRAVENOUS

## 2017-03-27 MED ORDER — FENTANYL CITRATE (PF) 250 MCG/5ML IJ SOLN
INTRAMUSCULAR | Status: AC
  Filled 2017-03-27: qty 5

## 2017-03-27 MED ORDER — CARVEDILOL 12.5 MG PO TABS
ORAL_TABLET | ORAL | Status: AC
  Administered 2017-03-27: 25 mg
  Filled 2017-03-27: qty 2

## 2017-03-27 MED ORDER — PROPOFOL 500 MG/50ML IV EMUL
INTRAVENOUS | Status: AC
  Filled 2017-03-27: qty 100

## 2017-03-27 MED ORDER — LIDOCAINE 2% (20 MG/ML) 5 ML SYRINGE
INTRAMUSCULAR | Status: AC
  Filled 2017-03-27: qty 5

## 2017-03-27 MED ORDER — LIDOCAINE HCL (PF) 1 % IJ SOLN
INTRAMUSCULAR | Status: DC | PRN
  Administered 2017-03-27: 9 mL

## 2017-03-27 MED ORDER — ONDANSETRON HCL 4 MG/2ML IJ SOLN
INTRAMUSCULAR | Status: AC
  Filled 2017-03-27: qty 2

## 2017-03-27 MED ORDER — OXYCODONE-ACETAMINOPHEN 5-325 MG PO TABS: 1.0000 | ORAL_TABLET | Freq: Four times a day (QID) | ORAL | 0 refills | Status: DC | PRN

## 2017-03-27 MED ORDER — PROPOFOL 10 MG/ML IV BOLUS
INTRAVENOUS | Status: AC
  Filled 2017-03-27: qty 40

## 2017-03-27 MED ORDER — PROPOFOL 1000 MG/100ML IV EMUL
INTRAVENOUS | Status: AC
  Filled 2017-03-27: qty 200

## 2017-03-27 MED ORDER — SODIUM CHLORIDE 0.9 % IV SOLN
INTRAVENOUS | Status: DC | PRN
  Administered 2017-03-27: 07:00:00 via INTRAVENOUS

## 2017-03-27 MED ORDER — LIDOCAINE HCL (CARDIAC) 20 MG/ML IV SOLN
INTRAVENOUS | Status: DC | PRN
  Administered 2017-03-27: 60 mg via INTRATRACHEAL

## 2017-03-27 MED ORDER — PROPOFOL 500 MG/50ML IV EMUL
INTRAVENOUS | Status: DC | PRN
  Administered 2017-03-27: 50 ug/kg/min via INTRAVENOUS

## 2017-03-27 MED ORDER — 0.9 % SODIUM CHLORIDE (POUR BTL) OPTIME
TOPICAL | Status: DC | PRN
Start: 2017-03-27 — End: 2017-03-27
  Administered 2017-03-27: 1000 mL

## 2017-03-27 SURGICAL SUPPLY — 32 items
ARMBAND PINK RESTRICT EXTREMIT (MISCELLANEOUS) ×3 IMPLANT
CANISTER SUCT 3000ML PPV (MISCELLANEOUS) ×3 IMPLANT
CANNULA VESSEL 3MM 2 BLNT TIP (CANNULA) ×3 IMPLANT
CLIP TI MEDIUM 6 (CLIP) ×3 IMPLANT
CLIP TI WIDE RED SMALL 6 (CLIP) ×3 IMPLANT
COVER PROBE W GEL 5X96 (DRAPES) ×3 IMPLANT
DECANTER SPIKE VIAL GLASS SM (MISCELLANEOUS) ×3 IMPLANT
DERMABOND ADVANCED (GAUZE/BANDAGES/DRESSINGS) ×2
DERMABOND ADVANCED .7 DNX12 (GAUZE/BANDAGES/DRESSINGS) ×1 IMPLANT
DRAIN PENROSE 1/4X12 LTX STRL (WOUND CARE) ×3 IMPLANT
ELECT REM PT RETURN 9FT ADLT (ELECTROSURGICAL) ×3
ELECTRODE REM PT RTRN 9FT ADLT (ELECTROSURGICAL) ×1 IMPLANT
GLOVE BIO SURGEON STRL SZ7.5 (GLOVE) ×3 IMPLANT
GLOVE BIOGEL PI IND STRL 6.5 (GLOVE) ×5 IMPLANT
GLOVE BIOGEL PI IND STRL 7.0 (GLOVE) ×2 IMPLANT
GLOVE BIOGEL PI INDICATOR 6.5 (GLOVE) ×10
GLOVE BIOGEL PI INDICATOR 7.0 (GLOVE) ×4
GOWN STRL REUS W/ TWL LRG LVL3 (GOWN DISPOSABLE) ×3 IMPLANT
GOWN STRL REUS W/TWL LRG LVL3 (GOWN DISPOSABLE) ×6
KIT BASIN OR (CUSTOM PROCEDURE TRAY) ×3 IMPLANT
KIT ROOM TURNOVER OR (KITS) ×3 IMPLANT
LOOP VESSEL MINI RED (MISCELLANEOUS) ×3 IMPLANT
NS IRRIG 1000ML POUR BTL (IV SOLUTION) ×3 IMPLANT
PACK CV ACCESS (CUSTOM PROCEDURE TRAY) ×3 IMPLANT
PAD ARMBOARD 7.5X6 YLW CONV (MISCELLANEOUS) ×6 IMPLANT
SPONGE SURGIFOAM ABS GEL 100 (HEMOSTASIS) IMPLANT
SUT PROLENE 7 0 BV 1 (SUTURE) ×3 IMPLANT
SUT VIC AB 3-0 SH 27 (SUTURE) ×2
SUT VIC AB 3-0 SH 27X BRD (SUTURE) ×1 IMPLANT
SUT VICRYL 4-0 PS2 18IN ABS (SUTURE) ×3 IMPLANT
UNDERPAD 30X30 (UNDERPADS AND DIAPERS) ×3 IMPLANT
WATER STERILE IRR 1000ML POUR (IV SOLUTION) ×3 IMPLANT

## 2017-03-27 NOTE — Anesthesia Preprocedure Evaluation (Addendum)
Anesthesia Evaluation  Patient identified by MRN, date of birth, ID band Patient awake    Reviewed: Allergy & Precautions, NPO status , Patient's Chart, lab work & pertinent test results, reviewed documented beta blocker date and time   Airway Mallampati: II  TM Distance: >3 FB Neck ROM: Full    Dental  (+) Dental Advisory Given, Partial Upper, Missing,    Pulmonary former smoker,    Pulmonary exam normal breath sounds clear to auscultation       Cardiovascular hypertension, Pt. on medications and Pt. on home beta blockers Normal cardiovascular exam Rhythm:Regular Rate:Normal     Neuro/Psych negative neurological ROS     GI/Hepatic negative GI ROS, Neg liver ROS,   Endo/Other  diabetes (Diabetic neuropathy), Insulin DependentObesity   Renal/GU Renal InsufficiencyRenal disease     Musculoskeletal negative musculoskeletal ROS (+)   Abdominal   Peds  Hematology negative hematology ROS (+)   Anesthesia Other Findings Day of surgery medications reviewed with the patient.  Reproductive/Obstetrics                            Anesthesia Physical Anesthesia Plan  ASA: III  Anesthesia Plan: MAC   Post-op Pain Management:    Induction: Intravenous  Airway Management Planned: Nasal Cannula  Additional Equipment:   Intra-op Plan:   Post-operative Plan:   Informed Consent: I have reviewed the patients History and Physical, chart, labs and discussed the procedure including the risks, benefits and alternatives for the proposed anesthesia with the patient or authorized representative who has indicated his/her understanding and acceptance.   Dental advisory given  Plan Discussed with: CRNA and Anesthesiologist  Anesthesia Plan Comments: (Discussed risks/benefits/alternatives to MAC sedation including need for ventilatory support, hypotension, need for conversion to general anesthesia.  All  patient questions answered.  Patient/guardian wishes to proceed.)        Anesthesia Quick Evaluation

## 2017-03-27 NOTE — Anesthesia Postprocedure Evaluation (Signed)
Anesthesia Post Note  Patient: [name]  Procedure(s) Performed: Procedure(s) (LRB): LEFT RADIOCEPHALIC ARTERIOVENOUS (AV) FISTULA CREATION (Left)  Patient location during evaluation: PACU Anesthesia Type: MAC Level of consciousness: awake and alert Pain management: pain level controlled Vital Signs Assessment: post-procedure vital signs reviewed and stable Respiratory status: spontaneous breathing, nonlabored ventilation, respiratory function stable and patient connected to nasal cannula oxygen Cardiovascular status: stable and blood pressure returned to baseline Anesthetic complications: no       Last Vitals:  Vitals:   03/27/17 1051 03/27/17 1055  BP:  136/76  Pulse: 73 70  Resp: 15 13  Temp: 36.4 C     Last Pain:  Vitals:   03/27/17 0709  TempSrc:   PainSc: 2                  Catalina Gravel

## 2017-03-27 NOTE — Telephone Encounter (Signed)
-----   Message from Mena Goes, RN sent at 03/27/2017 11:01 AM EDT ----- Regarding: 4-6 weeks    ----- Message ----- From: Elam Dutch, MD Sent: 03/27/2017  10:01 AM To: [facility] Charge Pool  Pt needs follow up in 4-6 weeks with duplex left arm fistula  Ruta Hinds

## 2017-03-27 NOTE — Progress Notes (Signed)
Glasses and teeth returned to pt

## 2017-03-27 NOTE — Interval H&P Note (Signed)
History and Physical Interval Note:  03/27/2017 7:30 AM  [name]  has presented today for surgery, with the diagnosis of Chronic kidney disease  The various methods of treatment have been discussed with the patient and family. After consideration of risks, benefits and other options for treatment, the patient has consented to  Procedure(s): LEFT RADIOCEPHALIC ARTERIOVENOUS (AV) FISTULA CREATION (Left) as a surgical intervention .  The patient's history has been reviewed, patient examined, no change in status, stable for surgery.  I have reviewed the patient's chart and labs.  Questions were answered to the patient's satisfaction.     Ruta Hinds

## 2017-03-27 NOTE — Op Note (Signed)
Procedure: Left Radial Cephalic AV fistula   Preop: ESRD   Postop: ESRD   Anesthesia: MAC with local   Assistant: Nurse   Findings: 3 mm cephalic vein       3 mm  Procedure Details:  The left upper extremity was prepped and draped in usual sterile fashion. Local anesthesia was infiltrated midway between the cephalic and radial artery anatomically.  A longitudinal skin incision was then made in this location at the distal left forearm. The incision was carried into the subcutaneous tissues down to level cephalic vein. The vein had some spasm but was overall reasonable quality accepting a 3.5 mm dilator. The vein was dissected free circumferentially and small side branches ligated and divided between silk ties. The distal end was ligated and the vein probed and found to accept up to a 3.5 mm dilator. This was gently distended with heparinized saline, spatulated, and marked for orientation. Next the radial artery was dissected free in the medial portion incision. The artery was 3 mm in diameter but had a reasonable pulse. The vessel loops were placed proximal and distal to the planned site of arteriotomy. The patient was given 5000 units of intravenous heparin. After appropriate circulation time, the vessel loops were used to control the artery. A longitudinal opening was made in the left radial artery. The vein was controlled proximally with a fine bulldog clamp. The vein was then swung over to the artery and sewn end of vein to side of artery using a running 7-0 Prolene suture. Just prior to completion, the anastomosis was fore bled back bled and thoroughly flushed. The anastomosis was secured, vessel loops released, and there was a palpable thrill in the fistula immediately. After hemostasis was obtained, the subcutaneous tissues were reapproximated using a running 3-0 Vicryl suture. The skin was then closed with a 4 0 Vicryl subcuticular stitch. Dermabond was applied to the skin incision. The patient  tolerated the procedure well and there were no complications. Instrument sponge and needle count were correct at the end of the case. The patient was taken to PACU in stable condition.   Ruta Hinds, MD  [facility]  Office: [phone]  Pager: [phone]

## 2017-03-27 NOTE — Anesthesia Procedure Notes (Signed)
Procedure Name: MAC Date/Time: 03/27/2017 8:00 AM Performed by: Scheryl Darter Pre-anesthesia Checklist: Patient identified, Emergency Drugs available, Suction available, Patient being monitored and Timeout performed Patient Re-evaluated:Patient Re-evaluated prior to inductionOxygen Delivery Method: Simple face mask Placement Confirmation: positive ETCO2
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
## 2017-03-27 NOTE — Progress Notes (Signed)
Pt coing of foot pain, Kim PA aware and will come see it

## 2017-03-27 NOTE — Transfer of Care (Signed)
Immediate Anesthesia Transfer of Care Note  Patient: [name]  Procedure(s) Performed: Procedure(s): LEFT RADIOCEPHALIC ARTERIOVENOUS (AV) FISTULA CREATION (Left)  Patient Location: PACU  Anesthesia Type:MAC  Level of Consciousness: awake, alert , oriented and sedated  Airway & Oxygen Therapy: Patient Spontanous Breathing and Patient connected to face mask oxygen  Post-op Assessment: Report given to RN, Post -op Vital signs reviewed and stable and Patient moving all extremities  Post vital signs: Reviewed and stable  Last Vitals:  Vitals:   03/27/17 0626  BP: (!) 166/71  Pulse: 78  Resp: 20  Temp: 37.3 C    Last Pain:  Vitals:   03/27/17 0709  TempSrc:   PainSc: 2       Patients Stated Pain Goal: 1 (19/37/90 2409)  Complications: No apparent anesthesia complications

## 2017-03-27 NOTE — Telephone Encounter (Signed)
Scheduled 6/7@2pm

## 2017-03-27 NOTE — Progress Notes (Signed)
Pt complaining of pain in left foot.  Has had ulcer present since February that a Dr Joya Gaskins has been taking care of.  Prior right TMA and left toe amp by Dr Doran Durand.  He has no palpable pulse in the foot but the foot is warm.  2 cm opening over metatarsal head 3rd toe. No purulence or drainage  Neuropathic left foot ulcer not infected Will schedule appt with me with ABIs and MRI foot in 2 weeks  He will continue follow up with Dr Joya Gaskins in the Magda Paganini, MD [facility]: [phone] Pager: [phone]

## 2017-03-28 ENCOUNTER — Telehealth: Payer: Self-pay | Admitting: Vascular Surgery

## 2017-03-28 ENCOUNTER — Encounter (HOSPITAL_COMMUNITY): Payer: Self-pay | Admitting: Vascular Surgery

## 2017-03-28 NOTE — Telephone Encounter (Signed)
Scheduled ABI for 4/27 @ 8:30am.  MRI to be scheduled shortly after. Waiting for patient to call back to confirm schedule.

## 2017-03-29 ENCOUNTER — Other Ambulatory Visit: Payer: Self-pay

## 2017-03-29 DIAGNOSIS — M869 Osteomyelitis, unspecified: Secondary | ICD-10-CM

## 2017-03-29 NOTE — Addendum Note (Signed)
Addended by: Lianne Cure A on: 03/29/2017 08:40 AM   Modules accepted: Orders

## 2017-03-30 ENCOUNTER — Inpatient Hospital Stay (HOSPITAL_COMMUNITY): Payer: Medicare Other

## 2017-03-30 ENCOUNTER — Inpatient Hospital Stay (HOSPITAL_COMMUNITY)
Admission: EM | Admit: 2017-03-30 | Discharge: 2017-04-05 | DRG: 617 | Disposition: A | Discharge status: Home Health | Payer: Medicare Other | Attending: Internal Medicine | Admitting: Internal Medicine

## 2017-03-30 ENCOUNTER — Emergency Department (HOSPITAL_COMMUNITY): Payer: Medicare Other

## 2017-03-30 ENCOUNTER — Inpatient Hospital Stay (HOSPITAL_COMMUNITY): Admission: RE | Admit: 2017-03-30 | Payer: BLUE CROSS/BLUE SHIELD | Source: Ambulatory Visit

## 2017-03-30 ENCOUNTER — Encounter (HOSPITAL_COMMUNITY): Payer: Self-pay | Admitting: *Deleted

## 2017-03-30 DIAGNOSIS — D509 Iron deficiency anemia, unspecified: Secondary | ICD-10-CM | POA: Diagnosis present

## 2017-03-30 DIAGNOSIS — M6702 Short Achilles tendon (acquired), left ankle: Secondary | ICD-10-CM | POA: Diagnosis present

## 2017-03-30 DIAGNOSIS — D638 Anemia in other chronic diseases classified elsewhere: Secondary | ICD-10-CM | POA: Diagnosis not present

## 2017-03-30 DIAGNOSIS — Z89431 Acquired absence of right foot: Secondary | ICD-10-CM | POA: Diagnosis not present

## 2017-03-30 DIAGNOSIS — L089 Local infection of the skin and subcutaneous tissue, unspecified: Secondary | ICD-10-CM | POA: Insufficient documentation

## 2017-03-30 DIAGNOSIS — D631 Anemia in chronic kidney disease: Secondary | ICD-10-CM

## 2017-03-30 DIAGNOSIS — E11621 Type 2 diabetes mellitus with foot ulcer: Secondary | ICD-10-CM | POA: Diagnosis present

## 2017-03-30 DIAGNOSIS — L97529 Non-pressure chronic ulcer of other part of left foot with unspecified severity: Secondary | ICD-10-CM | POA: Diagnosis present

## 2017-03-30 DIAGNOSIS — M868X7 Other osteomyelitis, ankle and foot: Secondary | ICD-10-CM

## 2017-03-30 DIAGNOSIS — M79672 Pain in left foot: Secondary | ICD-10-CM | POA: Diagnosis present

## 2017-03-30 DIAGNOSIS — Z8619 Personal history of other infectious and parasitic diseases: Secondary | ICD-10-CM | POA: Diagnosis not present

## 2017-03-30 DIAGNOSIS — D72828 Other elevated white blood cell count: Secondary | ICD-10-CM

## 2017-03-30 DIAGNOSIS — I120 Hypertensive chronic kidney disease with stage 5 chronic kidney disease or end stage renal disease: Secondary | ICD-10-CM | POA: Diagnosis present

## 2017-03-30 DIAGNOSIS — Z89432 Acquired absence of left foot: Secondary | ICD-10-CM | POA: Diagnosis not present

## 2017-03-30 DIAGNOSIS — N179 Acute kidney failure, unspecified: Secondary | ICD-10-CM | POA: Diagnosis present

## 2017-03-30 DIAGNOSIS — Z8249 Family history of ischemic heart disease and other diseases of the circulatory system: Secondary | ICD-10-CM | POA: Diagnosis not present

## 2017-03-30 DIAGNOSIS — M869 Osteomyelitis, unspecified: Secondary | ICD-10-CM | POA: Diagnosis present

## 2017-03-30 DIAGNOSIS — Z87891 Personal history of nicotine dependence: Secondary | ICD-10-CM | POA: Diagnosis not present

## 2017-03-30 DIAGNOSIS — Z794 Long term (current) use of insulin: Secondary | ICD-10-CM

## 2017-03-30 DIAGNOSIS — Z833 Family history of diabetes mellitus: Secondary | ICD-10-CM | POA: Diagnosis not present

## 2017-03-30 DIAGNOSIS — E871 Hypo-osmolality and hyponatremia: Secondary | ICD-10-CM | POA: Diagnosis present

## 2017-03-30 DIAGNOSIS — E1140 Type 2 diabetes mellitus with diabetic neuropathy, unspecified: Secondary | ICD-10-CM | POA: Diagnosis present

## 2017-03-30 DIAGNOSIS — Z89421 Acquired absence of other right toe(s): Secondary | ICD-10-CM | POA: Diagnosis not present

## 2017-03-30 DIAGNOSIS — L02612 Cutaneous abscess of left foot: Secondary | ICD-10-CM | POA: Diagnosis present

## 2017-03-30 DIAGNOSIS — N184 Chronic kidney disease, stage 4 (severe): Secondary | ICD-10-CM | POA: Diagnosis present

## 2017-03-30 DIAGNOSIS — E1169 Type 2 diabetes mellitus with other specified complication: Principal | ICD-10-CM | POA: Diagnosis present

## 2017-03-30 DIAGNOSIS — Z79899 Other long term (current) drug therapy: Secondary | ICD-10-CM | POA: Diagnosis not present

## 2017-03-30 DIAGNOSIS — Z01811 Encounter for preprocedural respiratory examination: Secondary | ICD-10-CM

## 2017-03-30 DIAGNOSIS — E1122 Type 2 diabetes mellitus with diabetic chronic kidney disease: Secondary | ICD-10-CM | POA: Diagnosis present

## 2017-03-30 DIAGNOSIS — E11622 Type 2 diabetes mellitus with other skin ulcer: Secondary | ICD-10-CM | POA: Diagnosis present

## 2017-03-30 DIAGNOSIS — N185 Chronic kidney disease, stage 5: Secondary | ICD-10-CM | POA: Diagnosis present

## 2017-03-30 DIAGNOSIS — Z89422 Acquired absence of other left toe(s): Secondary | ICD-10-CM

## 2017-03-30 DIAGNOSIS — E785 Hyperlipidemia, unspecified: Secondary | ICD-10-CM | POA: Diagnosis present

## 2017-03-30 DIAGNOSIS — I10 Essential (primary) hypertension: Secondary | ICD-10-CM | POA: Diagnosis not present

## 2017-03-30 DIAGNOSIS — E11628 Type 2 diabetes mellitus with other skin complications: Secondary | ICD-10-CM | POA: Diagnosis not present

## 2017-03-30 DIAGNOSIS — M8600 Acute hematogenous osteomyelitis, unspecified site: Secondary | ICD-10-CM | POA: Diagnosis not present

## 2017-03-30 DIAGNOSIS — D72829 Elevated white blood cell count, unspecified: Secondary | ICD-10-CM | POA: Diagnosis not present

## 2017-03-30 DIAGNOSIS — Z9889 Other specified postprocedural states: Secondary | ICD-10-CM | POA: Diagnosis not present

## 2017-03-30 LAB — CBC WITH DIFFERENTIAL/PLATELET
Basophils Absolute: 0 10*3/uL (ref 0.0–0.1)
Basophils Relative: 0 %
EOS ABS: 0 10*3/uL (ref 0.0–0.7)
Eosinophils Relative: 0 %
HEMATOCRIT: 27.1 % — AB (ref 39.0–52.0)
HEMOGLOBIN: 8.7 g/dL — AB (ref 13.0–17.0)
LYMPHS ABS: 1.1 10*3/uL (ref 0.7–4.0)
Lymphocytes Relative: 6 %
MCH: 22.5 pg — AB (ref 26.0–34.0)
MCHC: 32.1 g/dL (ref 30.0–36.0)
MCV: 70 fL — ABNORMAL LOW (ref 78.0–100.0)
MONOS PCT: 14 %
Monocytes Absolute: 2.6 10*3/uL — ABNORMAL HIGH (ref 0.1–1.0)
NEUTROS PCT: 80 %
Neutro Abs: 14.5 10*3/uL — ABNORMAL HIGH (ref 1.7–7.7)
Platelets: 264 10*3/uL (ref 150–400)
RBC: 3.87 MIL/uL — ABNORMAL LOW (ref 4.22–5.81)
RDW: 14.1 % (ref 11.5–15.5)
WBC: 18.2 10*3/uL — ABNORMAL HIGH (ref 4.0–10.5)

## 2017-03-30 LAB — COMPREHENSIVE METABOLIC PANEL
ALBUMIN: 2.6 g/dL — AB (ref 3.5–5.0)
ALT: 22 U/L (ref 17–63)
AST: 21 U/L (ref 15–41)
Alkaline Phosphatase: 68 U/L (ref 38–126)
Anion gap: 15 (ref 5–15)
BUN: 91 mg/dL — ABNORMAL HIGH (ref 6–20)
CHLORIDE: 101 mmol/L (ref 101–111)
CO2: 16 mmol/L — AB (ref 22–32)
CREATININE: 5.34 mg/dL — AB (ref 0.61–1.24)
Calcium: 8.5 mg/dL — ABNORMAL LOW (ref 8.9–10.3)
GFR calc non Af Amer: 11 mL/min — ABNORMAL LOW (ref >60)
GFR, EST AFRICAN AMERICAN: 12 mL/min — AB (ref >60)
Glucose, Bld: 159 mg/dL — ABNORMAL HIGH (ref 65–99)
Potassium: 4.3 mmol/L (ref 3.5–5.1)
SODIUM: 132 mmol/L — AB (ref 135–145)
Total Bilirubin: 0.5 mg/dL (ref 0.3–1.2)
Total Protein: 7.2 g/dL (ref 6.5–8.1)

## 2017-03-30 LAB — URINALYSIS, ROUTINE W REFLEX MICROSCOPIC
BACTERIA UA: NONE SEEN
Bilirubin Urine: NEGATIVE
Glucose, UA: 150 mg/dL — AB
Ketones, ur: NEGATIVE mg/dL
Leukocytes, UA: NEGATIVE
Nitrite: NEGATIVE
SQUAMOUS EPITHELIAL / LPF: NONE SEEN
Specific Gravity, Urine: 1.011 (ref 1.005–1.030)
pH: 5 (ref 5.0–8.0)

## 2017-03-30 LAB — I-STAT CG4 LACTIC ACID, ED: LACTIC ACID, VENOUS: 0.66 mmol/L (ref 0.5–1.9)

## 2017-03-30 MED ORDER — SODIUM CHLORIDE 0.9 % IV SOLN
INTRAVENOUS | Status: AC
  Administered 2017-03-31: 08:00:00 via INTRAVENOUS

## 2017-03-30 MED ORDER — ONDANSETRON HCL 4 MG/2ML IJ SOLN: 4.0000 mg | Freq: Four times a day (QID) | INTRAMUSCULAR | Status: AC | PRN

## 2017-03-30 MED ORDER — SODIUM BICARBONATE 650 MG PO TABS
650.0000 mg | ORAL_TABLET | Freq: Two times a day (BID) | ORAL | Status: DC
  Administered 2017-03-31 – 2017-04-05 (×12): 650 mg via ORAL
  Filled 2017-03-30 (×12): qty 1

## 2017-03-30 MED ORDER — VANCOMYCIN HCL 10 G IV SOLR
2000.0000 mg | Freq: Once | INTRAVENOUS | Status: AC
  Administered 2017-03-31: 2000 mg via INTRAVENOUS
  Filled 2017-03-30 (×2): qty 2000

## 2017-03-30 MED ORDER — POLYETHYLENE GLYCOL 3350 17 G PO PACK: 17.0000 g | PACK | Freq: Every day | ORAL | Status: DC | PRN

## 2017-03-30 MED ORDER — CARVEDILOL 25 MG PO TABS
25.0000 mg | ORAL_TABLET | Freq: Two times a day (BID) | ORAL | Status: DC
  Administered 2017-03-31 – 2017-04-05 (×10): 25 mg via ORAL
  Filled 2017-03-30 (×10): qty 1

## 2017-03-30 MED ORDER — POVIDONE-IODINE 10 % EX SWAB: 2.0000 "application " | Freq: Once | CUTANEOUS | Status: DC

## 2017-03-30 MED ORDER — INSULIN ASPART 100 UNIT/ML ~~LOC~~ SOLN
0.0000 [IU] | SUBCUTANEOUS | Status: DC
  Administered 2017-03-31 (×3): 3 [IU] via SUBCUTANEOUS
  Administered 2017-03-31 (×2): 2 [IU] via SUBCUTANEOUS
  Administered 2017-04-01: 3 [IU] via SUBCUTANEOUS
  Administered 2017-04-01: 2 [IU] via SUBCUTANEOUS
  Administered 2017-04-01: 3 [IU] via SUBCUTANEOUS
  Administered 2017-04-01: 2 [IU] via SUBCUTANEOUS
  Administered 2017-04-02: 3 [IU] via SUBCUTANEOUS
  Administered 2017-04-02: 2 [IU] via SUBCUTANEOUS
  Administered 2017-04-02 (×2): 3 [IU] via SUBCUTANEOUS
  Administered 2017-04-02: 2 [IU] via SUBCUTANEOUS
  Administered 2017-04-03: 3 [IU] via SUBCUTANEOUS
  Administered 2017-04-03: 2 [IU] via SUBCUTANEOUS
  Administered 2017-04-03 – 2017-04-04 (×2): 3 [IU] via SUBCUTANEOUS
  Administered 2017-04-05 (×2): 2 [IU] via SUBCUTANEOUS

## 2017-03-30 MED ORDER — AMLODIPINE BESYLATE 10 MG PO TABS
10.0000 mg | ORAL_TABLET | Freq: Every day | ORAL | Status: DC
  Administered 2017-03-31 – 2017-04-04 (×6): 10 mg via ORAL
  Filled 2017-03-30: qty 1
  Filled 2017-03-30: qty 2
  Filled 2017-03-30 (×4): qty 1

## 2017-03-30 MED ORDER — CHLORHEXIDINE GLUCONATE 4 % EX LIQD
60.0000 mL | Freq: Once | CUTANEOUS | Status: AC
  Administered 2017-03-31: 4 via TOPICAL
  Filled 2017-03-30: qty 60

## 2017-03-30 MED ORDER — ATORVASTATIN CALCIUM 10 MG PO TABS
10.0000 mg | ORAL_TABLET | Freq: Every day | ORAL | Status: DC
  Administered 2017-03-31 – 2017-04-04 (×6): 10 mg via ORAL
  Filled 2017-03-30 (×6): qty 1

## 2017-03-30 MED ORDER — HYDROMORPHONE HCL 1 MG/ML IJ SOLN: 0.5000 mg | INTRAMUSCULAR | Status: DC | PRN

## 2017-03-30 MED ORDER — HYDROMORPHONE HCL 1 MG/ML IJ SOLN
0.5000 mg | INTRAMUSCULAR | Status: AC | PRN
  Filled 2017-03-30: qty 1

## 2017-03-30 MED ORDER — ACETAMINOPHEN 650 MG RE SUPP: 650.0000 mg | Freq: Four times a day (QID) | RECTAL | Status: DC | PRN

## 2017-03-30 MED ORDER — INSULIN GLARGINE 100 UNIT/ML ~~LOC~~ SOLN
15.0000 [IU] | Freq: Two times a day (BID) | SUBCUTANEOUS | Status: DC
  Administered 2017-03-31 – 2017-04-05 (×11): 15 [IU] via SUBCUTANEOUS
  Filled 2017-03-30 (×14): qty 0.15

## 2017-03-30 MED ORDER — SODIUM CHLORIDE 0.9 % IV BOLUS (SEPSIS)
500.0000 mL | Freq: Once | INTRAVENOUS | Status: AC
  Administered 2017-03-30: 500 mL via INTRAVENOUS

## 2017-03-30 MED ORDER — FERROUS SULFATE 325 (65 FE) MG PO TABS
325.0000 mg | ORAL_TABLET | Freq: Every day | ORAL | Status: DC
Start: 2017-03-31 — End: 2017-04-05
  Administered 2017-03-31 – 2017-04-05 (×6): 325 mg via ORAL
  Filled 2017-03-30 (×6): qty 1

## 2017-03-30 MED ORDER — GABAPENTIN 300 MG PO CAPS
300.0000 mg | ORAL_CAPSULE | Freq: Three times a day (TID) | ORAL | Status: DC
  Administered 2017-03-31 – 2017-04-05 (×17): 300 mg via ORAL
  Filled 2017-03-30 (×17): qty 1

## 2017-03-30 MED ORDER — VANCOMYCIN HCL 10 G IV SOLR: 1500.0000 mg | INTRAVENOUS | Status: DC

## 2017-03-30 MED ORDER — ACETAMINOPHEN 325 MG PO TABS
650.0000 mg | ORAL_TABLET | Freq: Four times a day (QID) | ORAL | Status: DC | PRN
  Administered 2017-03-31 – 2017-04-04 (×6): 650 mg via ORAL
  Filled 2017-03-30 (×6): qty 2

## 2017-03-30 MED ORDER — PIPERACILLIN-TAZOBACTAM 3.375 G IVPB 30 MIN
3.3750 g | Freq: Once | INTRAVENOUS | Status: AC
  Administered 2017-03-30: 3.375 g via INTRAVENOUS
  Filled 2017-03-30: qty 50

## 2017-03-30 MED ORDER — TRAMADOL HCL 50 MG PO TABS
50.0000 mg | ORAL_TABLET | Freq: Four times a day (QID) | ORAL | Status: DC | PRN
  Administered 2017-03-31 – 2017-04-05 (×13): 50 mg via ORAL
  Filled 2017-03-30 (×13): qty 1

## 2017-03-30 MED ORDER — BISACODYL 5 MG PO TBEC
5.0000 mg | DELAYED_RELEASE_TABLET | Freq: Every day | ORAL | Status: DC | PRN
  Filled 2017-03-30: qty 1
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
## 2017-03-30 NOTE — ED Notes (Signed)
Phlebotomy at bedside at this time.

## 2017-03-30 NOTE — ED Notes (Signed)
Surgeon at bedside at this time. 

## 2017-03-30 NOTE — ED Triage Notes (Signed)
Pt reports having a wound/sore on bottom of left foot x 1 month. Reports it is now draining with foul odor. Has pain with ambulation and reports fatigue x 1 week. Had recent stent placed.
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
## 2017-03-30 NOTE — ED Notes (Signed)
Patient transported to X-ray via stretcher. Monitor intact.

## 2017-03-30 NOTE — Progress Notes (Signed)
Pharmacy Antibiotic Note [name] is a 56 y.o. male admitted on 03/30/2017 with L foot wound. Pharmacy has been consulted for vancomycin dosing.  Plan: 1. Vancomycin 2000 mg x 1 now followed by vancomycin 1500 mg every 48 hours starting on 4/29  2. Follow up on need for gram negative coverage    Temp (24hrs), Avg:100.1 F (37.8 C), Min:99.5 F (37.5 C), Max:100.8 F (38.2 C)   Recent Labs Lab 03/30/17 2058 03/30/17 2116  WBC 18.2*  --   CREATININE 5.34*  --   LATICACIDVEN  --  0.66    Estimated Creatinine Clearance: 20.8 mL/min (A) (by C-G formula based on SCr of 5.34 mg/dL (H)).    No Known Allergies   Thank you for allowing pharmacy to be a part of this patient's care.  Vincenza Hews, PharmD, BCPS 03/30/2017, 10:41 PM
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
## 2017-03-31 ENCOUNTER — Inpatient Hospital Stay (HOSPITAL_COMMUNITY): Payer: Medicare Other | Admitting: Certified Registered Nurse Anesthetist

## 2017-03-31 ENCOUNTER — Encounter (HOSPITAL_COMMUNITY)
Admission: EM | Disposition: A | Discharge status: Home Health | Payer: Self-pay | Source: Home / Self Care | Attending: Family Medicine

## 2017-03-31 ENCOUNTER — Encounter (HOSPITAL_COMMUNITY): Payer: Self-pay | Admitting: Certified Registered Nurse Anesthetist

## 2017-03-31 DIAGNOSIS — Z794 Long term (current) use of insulin: Secondary | ICD-10-CM

## 2017-03-31 DIAGNOSIS — E11628 Type 2 diabetes mellitus with other skin complications: Secondary | ICD-10-CM

## 2017-03-31 DIAGNOSIS — Z8249 Family history of ischemic heart disease and other diseases of the circulatory system: Secondary | ICD-10-CM

## 2017-03-31 DIAGNOSIS — D72829 Elevated white blood cell count, unspecified: Secondary | ICD-10-CM

## 2017-03-31 DIAGNOSIS — E1122 Type 2 diabetes mellitus with diabetic chronic kidney disease: Secondary | ICD-10-CM

## 2017-03-31 DIAGNOSIS — Z833 Family history of diabetes mellitus: Secondary | ICD-10-CM

## 2017-03-31 DIAGNOSIS — E1169 Type 2 diabetes mellitus with other specified complication: Principal | ICD-10-CM

## 2017-03-31 DIAGNOSIS — Z87891 Personal history of nicotine dependence: Secondary | ICD-10-CM

## 2017-03-31 DIAGNOSIS — Z9889 Other specified postprocedural states: Secondary | ICD-10-CM

## 2017-03-31 DIAGNOSIS — M8600 Acute hematogenous osteomyelitis, unspecified site: Secondary | ICD-10-CM

## 2017-03-31 DIAGNOSIS — Z89421 Acquired absence of other right toe(s): Secondary | ICD-10-CM

## 2017-03-31 DIAGNOSIS — L089 Local infection of the skin and subcutaneous tissue, unspecified: Secondary | ICD-10-CM

## 2017-03-31 DIAGNOSIS — M868X7 Other osteomyelitis, ankle and foot: Secondary | ICD-10-CM

## 2017-03-31 HISTORY — PX: AMPUTATION: SHX166

## 2017-03-31 LAB — BASIC METABOLIC PANEL
Anion gap: 14 (ref 5–15)
BUN: 90 mg/dL — AB (ref 6–20)
CO2: 17 mmol/L — ABNORMAL LOW (ref 22–32)
CREATININE: 5.3 mg/dL — AB (ref 0.61–1.24)
Calcium: 8.3 mg/dL — ABNORMAL LOW (ref 8.9–10.3)
Chloride: 103 mmol/L (ref 101–111)
GFR calc Af Amer: 13 mL/min — ABNORMAL LOW (ref >60)
GFR, EST NON AFRICAN AMERICAN: 11 mL/min — AB (ref >60)
GLUCOSE: 157 mg/dL — AB (ref 65–99)
Potassium: 4.6 mmol/L (ref 3.5–5.1)
SODIUM: 134 mmol/L — AB (ref 135–145)

## 2017-03-31 LAB — GLUCOSE, CAPILLARY
GLUCOSE-CAPILLARY: 119 mg/dL — AB (ref 65–99)
GLUCOSE-CAPILLARY: 145 mg/dL — AB (ref 65–99)
GLUCOSE-CAPILLARY: 163 mg/dL — AB (ref 65–99)
GLUCOSE-CAPILLARY: 172 mg/dL — AB (ref 65–99)
Glucose-Capillary: 143 mg/dL — ABNORMAL HIGH (ref 65–99)
Glucose-Capillary: 124 mg/dL — ABNORMAL HIGH (ref 65–99)

## 2017-03-31 LAB — SURGICAL PCR SCREEN
MRSA, PCR: NEGATIVE
Staphylococcus aureus: NEGATIVE

## 2017-03-31 LAB — PROTIME-INR
INR: 1.17
Prothrombin Time: 14.9 seconds (ref 11.4–15.2)

## 2017-03-31 LAB — CBC
HCT: 25.9 % — ABNORMAL LOW (ref 39.0–52.0)
Hemoglobin: 8.5 g/dL — ABNORMAL LOW (ref 13.0–17.0)
MCH: 23 pg — ABNORMAL LOW (ref 26.0–34.0)
MCHC: 32.8 g/dL (ref 30.0–36.0)
MCV: 70 fL — AB (ref 78.0–100.0)
PLATELETS: 272 10*3/uL (ref 150–400)
RBC: 3.7 MIL/uL — ABNORMAL LOW (ref 4.22–5.81)
RDW: 14.4 % (ref 11.5–15.5)
WBC: 17.9 10*3/uL — AB (ref 4.0–10.5)

## 2017-03-31 LAB — ABO/RH: ABO/RH(D): O POS

## 2017-03-31 LAB — TYPE AND SCREEN
ABO/RH(D): O POS
Antibody Screen: NEGATIVE

## 2017-03-31 SURGERY — AMPUTATION, FOOT, PARTIAL
Anesthesia: General | Site: Leg Lower | Laterality: Left

## 2017-03-31 MED ORDER — MIDAZOLAM HCL 5 MG/5ML IJ SOLN
INTRAMUSCULAR | Status: DC | PRN
  Administered 2017-03-31: 2 mg via INTRAVENOUS

## 2017-03-31 MED ORDER — BACITRACIN ZINC 500 UNIT/GM EX OINT
TOPICAL_OINTMENT | CUTANEOUS | Status: AC
  Filled 2017-03-31: qty 28.35

## 2017-03-31 MED ORDER — 0.9 % SODIUM CHLORIDE (POUR BTL) OPTIME
TOPICAL | Status: DC | PRN
  Administered 2017-03-31: 1000 mL

## 2017-03-31 MED ORDER — ONDANSETRON HCL 4 MG/2ML IJ SOLN
INTRAMUSCULAR | Status: AC
  Filled 2017-03-31: qty 4

## 2017-03-31 MED ORDER — MIDAZOLAM HCL 2 MG/2ML IJ SOLN
INTRAMUSCULAR | Status: AC
  Filled 2017-03-31: qty 2

## 2017-03-31 MED ORDER — PROPOFOL 10 MG/ML IV BOLUS
INTRAVENOUS | Status: AC
  Filled 2017-03-31: qty 20

## 2017-03-31 MED ORDER — HYDRALAZINE HCL 20 MG/ML IJ SOLN: 5.0000 mg | Freq: Four times a day (QID) | INTRAMUSCULAR | Status: DC | PRN

## 2017-03-31 MED ORDER — SODIUM CHLORIDE 0.9 % IV SOLN: INTRAVENOUS | Status: AC

## 2017-03-31 MED ORDER — PIPERACILLIN-TAZOBACTAM IN DEX 2-0.25 GM/50ML IV SOLN
2.2500 g | Freq: Three times a day (TID) | INTRAVENOUS | Status: DC
  Administered 2017-03-31 – 2017-04-03 (×9): 2.25 g via INTRAVENOUS
  Filled 2017-03-31 (×11): qty 50

## 2017-03-31 MED ORDER — LIDOCAINE 2% (20 MG/ML) 5 ML SYRINGE
INTRAMUSCULAR | Status: AC
  Filled 2017-03-31: qty 5

## 2017-03-31 MED ORDER — LIDOCAINE HCL (CARDIAC) 20 MG/ML IV SOLN
INTRAVENOUS | Status: DC | PRN
  Administered 2017-03-31: 100 mg via INTRAVENOUS

## 2017-03-31 MED ORDER — FENTANYL CITRATE (PF) 100 MCG/2ML IJ SOLN
INTRAMUSCULAR | Status: DC | PRN
  Administered 2017-03-31 (×2): 50 ug via INTRAVENOUS

## 2017-03-31 MED ORDER — ENOXAPARIN SODIUM 30 MG/0.3ML ~~LOC~~ SOLN
30.0000 mg | SUBCUTANEOUS | Status: DC
  Administered 2017-04-01 – 2017-04-05 (×5): 30 mg via SUBCUTANEOUS
  Filled 2017-03-31 (×5): qty 0.3

## 2017-03-31 MED ORDER — SODIUM CHLORIDE 0.9 % IR SOLN
Status: DC | PRN
  Administered 2017-03-31: 3000 mL

## 2017-03-31 MED ORDER — SODIUM CHLORIDE 0.9 % IV SOLN
INTRAVENOUS | Status: DC
  Administered 2017-03-31 – 2017-04-02 (×2): via INTRAVENOUS

## 2017-03-31 MED ORDER — FENTANYL CITRATE (PF) 100 MCG/2ML IJ SOLN: 25.0000 ug | INTRAMUSCULAR | Status: DC | PRN

## 2017-03-31 MED ORDER — FENTANYL CITRATE (PF) 250 MCG/5ML IJ SOLN
INTRAMUSCULAR | Status: AC
  Filled 2017-03-31: qty 5

## 2017-03-31 MED ORDER — BUPIVACAINE-EPINEPHRINE (PF) 0.5% -1:200000 IJ SOLN
INTRAMUSCULAR | Status: AC
  Filled 2017-03-31: qty 30

## 2017-03-31 MED ORDER — ONDANSETRON HCL 4 MG/2ML IJ SOLN
INTRAMUSCULAR | Status: DC | PRN
  Administered 2017-03-31: 4 mg via INTRAVENOUS

## 2017-03-31 MED ORDER — PROPOFOL 10 MG/ML IV BOLUS
INTRAVENOUS | Status: DC | PRN
  Administered 2017-03-31: 180 mg via INTRAVENOUS

## 2017-03-31 SURGICAL SUPPLY — 53 items
BANDAGE ACE 4X5 VEL STRL LF (GAUZE/BANDAGES/DRESSINGS) ×3 IMPLANT
BANDAGE ACE 6X5 VEL STRL LF (GAUZE/BANDAGES/DRESSINGS) ×3 IMPLANT
BANDAGE ELASTIC 3 VELCRO ST LF (GAUZE/BANDAGES/DRESSINGS) ×3 IMPLANT
BANDAGE ELASTIC 6 VELCRO ST LF (GAUZE/BANDAGES/DRESSINGS) ×3 IMPLANT
BLADE AVERAGE 25MMX9MM (BLADE) ×1
BLADE AVERAGE 25X9 (BLADE) ×2 IMPLANT
BLADE SAW SGTL HD 18.5X60.5X1. (BLADE) ×3 IMPLANT
BLADE SAW SGTL MED 73X18.5 STR (BLADE) IMPLANT
BNDG ESMARK 4X9 LF (GAUZE/BANDAGES/DRESSINGS) ×3 IMPLANT
CANISTER SUCT 3000ML PPV (MISCELLANEOUS) ×3 IMPLANT
CUFF TOURNIQUET SINGLE 34IN LL (TOURNIQUET CUFF) ×3 IMPLANT
CUFF TOURNIQUET SINGLE 44IN (TOURNIQUET CUFF) IMPLANT
DRAPE U-SHAPE 47X51 STRL (DRAPES) ×6 IMPLANT
DRSG ADAPTIC 3X8 NADH LF (GAUZE/BANDAGES/DRESSINGS) ×3 IMPLANT
DRSG MEPITEL 4X7.2 (GAUZE/BANDAGES/DRESSINGS) ×3 IMPLANT
DRSG PAD ABDOMINAL 8X10 ST (GAUZE/BANDAGES/DRESSINGS) ×3 IMPLANT
DURAPREP 26ML APPLICATOR (WOUND CARE) ×3 IMPLANT
ELECT REM PT RETURN 9FT ADLT (ELECTROSURGICAL) ×3
ELECTRODE REM PT RTRN 9FT ADLT (ELECTROSURGICAL) ×1 IMPLANT
GAUZE SPONGE 4X4 12PLY STRL (GAUZE/BANDAGES/DRESSINGS) ×3 IMPLANT
GAUZE SPONGE 4X4 12PLY STRL LF (GAUZE/BANDAGES/DRESSINGS) ×3 IMPLANT
GLOVE BIO SURGEON STRL SZ8 (GLOVE) ×6 IMPLANT
GLOVE BIOGEL PI IND STRL 8 (GLOVE) ×2 IMPLANT
GLOVE BIOGEL PI INDICATOR 8 (GLOVE) ×4
GLOVE ECLIPSE 8.0 STRL XLNG CF (GLOVE) ×6 IMPLANT
GOWN STRL REUS W/ TWL LRG LVL3 (GOWN DISPOSABLE) ×1 IMPLANT
GOWN STRL REUS W/ TWL XL LVL3 (GOWN DISPOSABLE) ×2 IMPLANT
GOWN STRL REUS W/TWL LRG LVL3 (GOWN DISPOSABLE) ×2
GOWN STRL REUS W/TWL XL LVL3 (GOWN DISPOSABLE) ×4
KIT BASIN OR (CUSTOM PROCEDURE TRAY) ×3 IMPLANT
KIT ROOM TURNOVER OR (KITS) ×3 IMPLANT
NEEDLE 25GAX1.5 (MISCELLANEOUS) ×3 IMPLANT
NS IRRIG 1000ML POUR BTL (IV SOLUTION) ×3 IMPLANT
PACK ORTHO EXTREMITY (CUSTOM PROCEDURE TRAY) ×3 IMPLANT
PAD ABD 8X10 STRL (GAUZE/BANDAGES/DRESSINGS) ×6 IMPLANT
PAD ARMBOARD 7.5X6 YLW CONV (MISCELLANEOUS) ×6 IMPLANT
PAD CAST 4YDX4 CTTN HI CHSV (CAST SUPPLIES) ×1 IMPLANT
PADDING CAST ABS 6INX4YD NS (CAST SUPPLIES) ×2
PADDING CAST ABS COTTON 6X4 NS (CAST SUPPLIES) ×1 IMPLANT
PADDING CAST COTTON 4X4 STRL (CAST SUPPLIES) ×2
PADDING CAST COTTON 6X4 STRL (CAST SUPPLIES) ×3 IMPLANT
SPONGE LAP 18X18 X RAY DECT (DISPOSABLE) ×3 IMPLANT
STOCKINETTE IMPERVIOUS LG (DRAPES) IMPLANT
SUCTION FRAZIER HANDLE 10FR (MISCELLANEOUS) ×2
SUCTION TUBE FRAZIER 10FR DISP (MISCELLANEOUS) ×1 IMPLANT
SUT ETHILON 2 0 PSLX (SUTURE) ×6 IMPLANT
SYR CONTROL 10ML LL (SYRINGE) IMPLANT
TOWEL OR 17X24 6PK STRL BLUE (TOWEL DISPOSABLE) ×3 IMPLANT
TOWEL OR 17X26 10 PK STRL BLUE (TOWEL DISPOSABLE) ×3 IMPLANT
TUBE CONNECTING 12'X1/4 (SUCTIONS) ×1
TUBE CONNECTING 12X1/4 (SUCTIONS) ×2 IMPLANT
UNDERPAD 30X30 (UNDERPADS AND DIAPERS) ×3 IMPLANT
WATER STERILE IRR 1000ML POUR (IV SOLUTION) ×3 IMPLANT

## 2017-03-31 NOTE — Transfer of Care (Signed)
Immediate Anesthesia Transfer of Care Note  Patient: [name]  Procedure(s) Performed: Procedure(s): Transmetatarsal amputation left foot (Left)  Patient Location: PACU  Anesthesia Type:General  Level of Consciousness: awake and patient cooperative  Airway & Oxygen Therapy: Patient Spontanous Breathing and Patient connected to nasal cannula oxygen  Post-op Assessment: Report given to RN, Post -op Vital signs reviewed and stable and Patient moving all extremities  Post vital signs: Reviewed and stable  Last Vitals:  Vitals:   03/31/17 0007 03/31/17 0434  BP: (!) 161/69 (!) 149/65  Pulse: 85 77  Resp:    Temp: 37.8 C (!) 38.1 C    Last Pain:  Vitals:   03/31/17 0434  TempSrc: Oral  PainSc:       Patients Stated Pain Goal: 1 (99/83/38 2505)  Complications: No apparent anesthesia complications

## 2017-03-31 NOTE — Anesthesia Procedure Notes (Signed)
Procedure Name: LMA Insertion Date/Time: 03/31/2017 7:54 AM Performed by: Rogers Blocker Pre-anesthesia Checklist: Patient identified, Emergency Drugs available, Suction available, Patient being monitored and Timeout performed Patient Re-evaluated:Patient Re-evaluated prior to inductionOxygen Delivery Method: Circle system utilized Preoxygenation: Pre-oxygenation with 100% oxygen Intubation Type: IV induction Ventilation: Mask ventilation without difficulty LMA: LMA inserted LMA Size: 5.0 Number of attempts: 1 Placement Confirmation: positive ETCO2,  CO2 detector and breath sounds checked- equal and bilateral Tube secured with: Tape Dental Injury: Teeth and Oropharynx as per pre-operative assessment

## 2017-03-31 NOTE — Anesthesia Postprocedure Evaluation (Signed)
Anesthesia Post Note  Patient: [name]  Procedure(s) Performed: Procedure(s) (LRB): Transmetatarsal amputation left foot (Left)  Patient location during evaluation: PACU Anesthesia Type: General Level of consciousness: awake Pain management: pain level controlled Vital Signs Assessment: post-procedure vital signs reviewed and stable Respiratory status: spontaneous breathing Cardiovascular status: stable Anesthetic complications: no       Last Vitals:  Vitals:   03/31/17 0434 03/31/17 0845  BP: (!) 149/65 131/71  Pulse: 77   Resp:  13  Temp: (!) 38.1 C 36.6 C    Last Pain:  Vitals:   03/31/17 0434  TempSrc: Oral  PainSc:                  Shiv Shuey

## 2017-03-31 NOTE — Brief Op Note (Signed)
03/30/2017 - 03/31/2017  8:45 AM  PATIENT:  [name]  56 y.o. male  PRE-OPERATIVE DIAGNOSIS: 1.  Left plantar forefoot diabetic ulcer      2.  Left foot osteomyelitis   POST-OPERATIVE DIAGNOSIS: 1.  Left plantar forefoot diabetic ulcer      2.  Left foot osteomyelitis      3.  Tight left heelcord      4.  Left foot deep abscess   Procedure(s): 1.  Left percutaneous heelcord lengthening   2.  Left foot transmetatarsal amputation   3.  Irrigation and excisional debridement of deep left foot abscess including multiple areas  SURGEON:  [name], MD  ASSISTANT: n/a  ANESTHESIA:   General  EBL:  minimal   TOURNIQUET:   Total Tourniquet Time Documented: Thigh (Left) - 36 minutes Total: Thigh (Left) - 36 minutes  COMPLICATIONS:  None apparent  DISPOSITION:  Extubated, awake and stable to recovery.  DICTATION ID:  [id number]

## 2017-03-31 NOTE — Progress Notes (Signed)
PT Cancellation Note  Patient Details Name: [name] MRN: [id number] DOB: August 29, 1961   Cancelled Treatment:    Reason Eval/Treat Not Completed: Patient not medically ready.  Patient to OR today for surgery.  Will return tomorrow for PT evaluation.   Despina Pole 03/31/2017, 2:36 PM Carita Pian. Sanjuana Kava, [place] Pager [phone]

## 2017-03-31 NOTE — Anesthesia Procedure Notes (Signed)
Procedure Name: LMA Insertion Date/Time: 03/31/2017 7:54 AM Performed by: Rogers Blocker Pre-anesthesia Checklist: Patient identified, Emergency Drugs available, Suction available, Patient being monitored and Timeout performed Patient Re-evaluated:Patient Re-evaluated prior to inductionOxygen Delivery Method: Circle system utilized Preoxygenation: Pre-oxygenation with 100% oxygen Intubation Type: IV induction Ventilation: Mask ventilation without difficulty LMA: LMA inserted LMA Size: 5.0 Number of attempts: 1 Placement Confirmation: CO2 detector,  positive ETCO2 and breath sounds checked- equal and bilateral Tube secured with: Tape Dental Injury: Teeth and Oropharynx as per pre-operative assessment

## 2017-03-31 NOTE — Progress Notes (Signed)
Called and gave report to Melvenia Beam in short stay. Made aware of pt's temp of 100.5 and that tylenol was given.

## 2017-03-31 NOTE — Progress Notes (Signed)
PROGRESS NOTE    [name]  WHQ:[id number] DOB: Apr 30, 1961 DOA: 03/30/2017 PCP: Pcp Not In System    Brief Narrative:  Pt is a 56 y/o that presented with left foot plantar forefoot diabetic ulcer and left foot osteomyelitis. Ortho and ID on board.    Assessment & Plan:   Principal Problem:   Osteomyelitis ([place]) - Ortho and ID on board -  Pt is s/p:  1.  Left plantar forefoot diabetic ulcer 2.  Left foot osteomyelitis 3.  Tight left heelcord 4.  Left foot deep abscess  Active Problems:   Anemia of chronic disease - cbc next am suspected DIP in hemoglobin levels especially after operation      Acute on chronic kidney disease stage 4, GFR 15-29 ml/min (HCC) - Continue renally dose antibiotics - Continue gentle fluid rehydration and reassess serum creatinine next a.m.    Hyponatremia - Improving with IV fluid hydration. We'll continue reassess next a.m.    Essential hypertension - Continue current antihypertensive regimen. Patient may require further titration. - We'll add when necessary hypertensive medication    DVT prophylaxis:Lovenox Code Status: Full Family Communication: Discussed directly with patient Disposition Plan:pending improvement in condition   Consultants:   ID and Ortho  Procedures: None  Antimicrobials: Zosyn   Subjective: Pt has no new complaints. No acute issues overnight.  Objective: Vitals:   03/31/17 0915 03/31/17 0917 03/31/17 0930 03/31/17 1300  BP:  134/77 138/61 (!) 174/71  Pulse: 69 70 73 76  Resp: 13 12 16 16   Temp:  97.2 F (36.2 C) 97.8 F (36.6 C) 98.1 F (36.7 C)  TempSrc:    Oral  SpO2: 96% 94% 94% 96%  Weight:      Height:        Intake/Output Summary (Last 24 hours) at 03/31/17 1716 Last data filed at 03/31/17 1100  Gross per 24 hour  Intake          2836.67 ml  Output               20 ml  Net          2816.67 ml   Filed Weights   03/31/17 0007  Weight: 106.6 kg (235 lb 1.6 oz)     Examination:  General exam: Appears calm and comfortable, in nad. Respiratory system: Clear to auscultation. Respiratory effort normal. Cardiovascular system: S1 & S2 heard, RRR. No JVD, murmurs, rubs, gallops or clicks. No pedal edema. Gastrointestinal system: Abdomen is nondistended, soft and nontender. No organomegaly or masses felt. Normal bowel sounds heard. Central nervous system: Alert and oriented. No focal neurological deficits. Extremities: Symmetric 5 x 5 power. Pt is s/p TMA of left foot. Skin: No rashes, warm and dry Psychiatry: Judgement and insight appear normal. Mood & affect appropriate.     Data Reviewed: I have personally reviewed following labs and imaging studies  CBC:  Recent Labs Lab 03/27/17 0708 03/30/17 2058 03/31/17 0004  WBC  --  18.2* 17.9*  NEUTROABS  --  14.5*  --   HGB 9.9* 8.7* 8.5*  HCT 29.0* 27.1* 25.9*  MCV  --  70.0* 70.0*  PLT  --  264 659   Basic Metabolic Panel:  Recent Labs Lab 03/27/17 0708 03/30/17 2058 03/31/17 0004  NA 139 132* 134*  K 4.3 4.3 4.6  CL  --  101 103  CO2  --  16* 17*  GLUCOSE 140* 159* 157*  BUN  --  91* 90*  CREATININE  --  5.34* 5.30*  CALCIUM  --  8.5* 8.3*   GFR: Estimated Creatinine Clearance: 20.3 mL/min (A) (by C-G formula based on SCr of 5.3 mg/dL (H)). Liver Function Tests:  Recent Labs Lab 03/30/17 2058  AST 21  ALT 22  ALKPHOS 68  BILITOT 0.5  PROT 7.2  ALBUMIN 2.6*   No results for input(s): LIPASE, AMYLASE in the last 168 hours. No results for input(s): AMMONIA in the last 168 hours. Coagulation Profile:  Recent Labs Lab 03/31/17 0004  INR 1.17   Cardiac Enzymes: No results for input(s): CKTOTAL, CKMB, CKMBINDEX, TROPONINI in the last 168 hours. BNP (last 3 results) No results for input(s): PROBNP in the last 8760 hours. HbA1C: No results for input(s): HGBA1C in the last 72 hours. CBG:  Recent Labs Lab 03/31/17 0015 03/31/17 0439 03/31/17 0654 03/31/17 0850  03/31/17 1239  GLUCAP 145* 163* 143* 119* 172*   Lipid Profile: No results for input(s): CHOL, HDL, LDLCALC, TRIG, CHOLHDL, LDLDIRECT in the last 72 hours. Thyroid Function Tests: No results for input(s): TSH, T4TOTAL, FREET4, T3FREE, THYROIDAB in the last 72 hours. Anemia Panel: No results for input(s): VITAMINB12, FOLATE, FERRITIN, TIBC, IRON, RETICCTPCT in the last 72 hours. Sepsis Labs:  Recent Labs Lab 03/30/17 2116  LATICACIDVEN 0.66    Recent Results (from the past 240 hour(s))  Blood Culture (routine x 2)     Status: None (Preliminary result)   Collection Time: 03/30/17  8:58 PM  Result Value Ref Range Status   Specimen Description BLOOD RIGHT WRIST  Final   Special Requests   Final    BOTTLES DRAWN AEROBIC AND ANAEROBIC Blood Culture adequate volume 5CC EACH   Culture NO GROWTH < 24 HOURS  Final   Report Status PENDING  Incomplete  Blood Culture (routine x 2)     Status: None (Preliminary result)   Collection Time: 03/30/17  9:08 PM  Result Value Ref Range Status   Specimen Description BLOOD RIGHT HAND  Final   Special Requests   Final    IN PEDIATRIC BOTTLE Blood Culture adequate volume 3CC   Culture NO GROWTH < 24 HOURS  Final   Report Status PENDING  Incomplete  Wound or Superficial Culture     Status: None (Preliminary result)   Collection Time: 03/30/17 10:32 PM  Result Value Ref Range Status   Specimen Description WOUND FOOT  Final   Special Requests NONE  Final   Gram Stain   Final    RARE WBC PRESENT, PREDOMINANTLY PMN MODERATE GRAM POSITIVE COCCI    Culture PENDING  Incomplete   Report Status PENDING  Incomplete  Surgical pcr screen     Status: None   Collection Time: 03/31/17  1:08 AM  Result Value Ref Range Status   MRSA, PCR NEGATIVE NEGATIVE Final   Staphylococcus aureus NEGATIVE NEGATIVE Final    Comment:        The Xpert SA Assay (FDA approved for NASAL specimens in patients over 32 years of age), is one component of a comprehensive  surveillance program.  Test performance has been validated by [facility] for patients greater than or equal to 54 year old. It is not intended to diagnose infection nor to guide or monitor treatment.          Radiology Studies: Dg Chest Port 1 View  Result Date: 03/30/2017 CLINICAL DATA:  Preop chest x-ray for foot ulcer history of hypertension and diabetes EXAM: PORTABLE CHEST 1 VIEW COMPARISON:  08/20/2013 FINDINGS: The heart  size and mediastinal contours are within normal limits. Both lungs are clear. The visualized skeletal structures are unremarkable. IMPRESSION: No active disease. Electronically Signed   By: Donavan Foil M.D.   On: 03/30/2017 23:55   Dg Foot Complete Left  Result Date: 03/30/2017 CLINICAL DATA:  Wound  lower on bottom of left foot EXAM: LEFT FOOT - COMPLETE 3+ VIEW COMPARISON:  MRI 01/22/2017, radiographs 01/20/2017 FINDINGS: Patient is status post amputation of the second digit at the level of the MTP joint. Scattered pockets of soft tissue gas along the plantar surface of the second through fourth digits. Chronic deformity of the distal fourth metatarsal. Partial amputation of the distal fourth digit as before. Possible erosive change at the base of the third proximal phalanx. Vascular calcifications IMPRESSION: 1. Possible erosive change at the base of the third proximal phalanx, with possible articular extension to the third MTP joint raises possibility for osteomyelitis/joint infection. 2. Soft tissue gas along the plantar surface of the second through fourth digits distally Electronically Signed   By: Donavan Foil M.D.   On: 03/30/2017 21:52        Scheduled Meds: . amLODipine  10 mg Oral QHS  . atorvastatin  10 mg Oral QHS  . carvedilol  25 mg Oral BID WC  . [START ON 04/01/2017] enoxaparin (LOVENOX) injection  30 mg Subcutaneous Q24H  . ferrous sulfate  325 mg Oral Q breakfast  . gabapentin  300 mg Oral TID  . insulin aspart  0-15 Units Subcutaneous  Q4H  . insulin glargine  15 Units Subcutaneous BID  . sodium bicarbonate  650 mg Oral BID   Continuous Infusions: . sodium chloride 10 mL/hr at 03/31/17 0701  . piperacillin-tazobactam (ZOSYN)  IV 2.25 g (03/31/17 1652)     LOS: 1 day    Time spent: > 35 minutes  Velvet Bathe, MD [facility] Pager [phone]  If 7PM-7AM, please contact night-coverage [email or web address] Password [facility] 03/31/2017, 5:16 PM

## 2017-03-31 NOTE — Progress Notes (Signed)
Orthopedic Tech Progress Note Patient Details:  [name] 23-Dec-1960 [id number]  Ortho Devices Type of Ortho Device: CAM walker Ortho Device/Splint Interventions: Loanne Drilling, Ellamae Lybeck 03/31/2017, 10:26 AM As ordered by Dr. Doran Durand

## 2017-03-31 NOTE — Anesthesia Preprocedure Evaluation (Signed)
Anesthesia Evaluation  Patient identified by MRN, date of birth, ID band Patient awake    Reviewed: Allergy & Precautions, NPO status , Patient's Chart, lab work & pertinent test results  Airway Mallampati: II  TM Distance: >3 FB     Dental   Pulmonary former smoker,    breath sounds clear to auscultation       Cardiovascular hypertension,  Rhythm:Regular Rate:Normal     Neuro/Psych    GI/Hepatic negative GI ROS, Neg liver ROS,   Endo/Other  diabetes  Renal/GU Renal disease     Musculoskeletal   Abdominal   Peds  Hematology  (+) anemia ,   Anesthesia Other Findings   Reproductive/Obstetrics                             Anesthesia Physical Anesthesia Plan  ASA: III  Anesthesia Plan: General   Post-op Pain Management:    Induction: Intravenous  Airway Management Planned: LMA  Additional Equipment:   Intra-op Plan:   Post-operative Plan: Extubation in OR  Informed Consent: I have reviewed the patients History and Physical, chart, labs and discussed the procedure including the risks, benefits and alternatives for the proposed anesthesia with the patient or authorized representative who has indicated his/her understanding and acceptance.   Dental advisory given  Plan Discussed with: CRNA and Anesthesiologist  Anesthesia Plan Comments:         Anesthesia Quick Evaluation

## 2017-03-31 NOTE — Anesthesia Postprocedure Evaluation (Signed)
Anesthesia Post Note  Patient: [name]  Procedure(s) Performed: Procedure(s) (LRB): Transmetatarsal amputation left foot (Left)  Patient location during evaluation: PACU Anesthesia Type: General Level of consciousness: awake Pain management: pain level controlled Vital Signs Assessment: post-procedure vital signs reviewed and stable Respiratory status: spontaneous breathing Cardiovascular status: stable Anesthetic complications: no       Last Vitals:  Vitals:   03/31/17 0917 03/31/17 0930  BP: 134/77 138/61  Pulse: 70 73  Resp: 12 16  Temp: 36.2 C (P) 36.6 C    Last Pain:  Vitals:   03/31/17 1000  TempSrc:   PainSc: 2                  Darrow Barreiro
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
## 2017-03-31 NOTE — Progress Notes (Signed)
PT temp of 100.5 medicated pt with tylenol for surgery.

## 2017-04-01 ENCOUNTER — Encounter (HOSPITAL_COMMUNITY): Payer: Self-pay | Admitting: Orthopedic Surgery

## 2017-04-01 LAB — BASIC METABOLIC PANEL
ANION GAP: 13 (ref 5–15)
BUN: 85 mg/dL — AB (ref 6–20)
CHLORIDE: 105 mmol/L (ref 101–111)
CO2: 17 mmol/L — ABNORMAL LOW (ref 22–32)
Calcium: 7.9 mg/dL — ABNORMAL LOW (ref 8.9–10.3)
Creatinine, Ser: 5.26 mg/dL — ABNORMAL HIGH (ref 0.61–1.24)
GFR calc Af Amer: 13 mL/min — ABNORMAL LOW (ref >60)
GFR, EST NON AFRICAN AMERICAN: 11 mL/min — AB (ref >60)
GLUCOSE: 113 mg/dL — AB (ref 65–99)
POTASSIUM: 4.2 mmol/L (ref 3.5–5.1)
SODIUM: 135 mmol/L (ref 135–145)

## 2017-04-01 LAB — GLUCOSE, CAPILLARY
GLUCOSE-CAPILLARY: 153 mg/dL — AB (ref 65–99)
Glucose-Capillary: 187 mg/dL — ABNORMAL HIGH (ref 65–99)
Glucose-Capillary: 131 mg/dL — ABNORMAL HIGH (ref 65–99)
Glucose-Capillary: 107 mg/dL — ABNORMAL HIGH (ref 65–99)
Glucose-Capillary: 139 mg/dL — ABNORMAL HIGH (ref 65–99)
Glucose-Capillary: 147 mg/dL — ABNORMAL HIGH (ref 65–99)
Glucose-Capillary: 139 mg/dL — ABNORMAL HIGH (ref 65–99)

## 2017-04-01 LAB — HIV ANTIBODY (ROUTINE TESTING W REFLEX): HIV SCREEN 4TH GENERATION: NONREACTIVE

## 2017-04-01 LAB — HEMOGLOBIN A1C
Hgb A1c MFr Bld: 7 % — ABNORMAL HIGH (ref 4.8–5.6)
MEAN PLASMA GLUCOSE: 154 mg/dL

## 2017-04-01 LAB — CBC
HCT: 25.5 % — ABNORMAL LOW (ref 39.0–52.0)
HEMOGLOBIN: 8 g/dL — AB (ref 13.0–17.0)
MCH: 22 pg — AB (ref 26.0–34.0)
MCHC: 31.4 g/dL (ref 30.0–36.0)
MCV: 70.2 fL — AB (ref 78.0–100.0)
PLATELETS: 262 10*3/uL (ref 150–400)
RBC: 3.63 MIL/uL — AB (ref 4.22–5.81)
RDW: 14.1 % (ref 11.5–15.5)
WBC: 19.3 10*3/uL — AB (ref 4.0–10.5)

## 2017-04-01 LAB — SEDIMENTATION RATE: Sed Rate: 114 mm/hr — ABNORMAL HIGH (ref 0–16)

## 2017-04-01 LAB — C-REACTIVE PROTEIN: CRP: 15.7 mg/dL — ABNORMAL HIGH (ref <1.0)

## 2017-04-01 NOTE — Progress Notes (Signed)
   Subjective:  Patient reports pain as mild.  Complaining of some increase in his baseline ankle pain with WB, otherwise no complaints.  Denies NS/chills or SOB/CP.  Objective:   VITALS:   Vitals:   03/31/17 1300 03/31/17 2103 04/01/17 0134 04/01/17 0525  BP: (!) 174/71 (!) 148/69 (!) 147/69 (!) 154/67  Pulse: 76 79 72 77  Resp: 16 16 16 16   Temp: 98.1 F (36.7 C) 100.2 F (37.9 C) 99.9 F (37.7 C) 100 F (37.8 C)  TempSrc: Oral Oral Oral Oral  SpO2: 96% 95% 96% 95%  Weight:      Height:        CAM boot in place, removed and his post op dressing is c/d/i, no pain with calf compression, compartments soft.   Lab Results  Component Value Date   WBC 19.3 (H) 04/01/2017   HGB 8.0 (L) 04/01/2017   HCT 25.5 (L) 04/01/2017   MCV 70.2 (L) 04/01/2017   PLT 262 04/01/2017   BMET    Component Value Date/Time   NA 135 04/01/2017 0534   K 4.2 04/01/2017 0534   CL 105 04/01/2017 0534   CO2 17 (L) 04/01/2017 0534   GLUCOSE 113 (H) 04/01/2017 0534   BUN 85 (H) 04/01/2017 0534   CREATININE 5.26 (H) 04/01/2017 0534   CALCIUM 7.9 (L) 04/01/2017 0534   GFRNONAA 11 (L) 04/01/2017 0534   GFRAA 13 (L) 04/01/2017 0534     Assessment/Plan: 1 Day Post-Op   Principal Problem:   Osteomyelitis (HCC) Active Problems:   Anemia of chronic disease   CKD (chronic kidney disease) stage 4, GFR 15-29 ml/min (HCC)   Left foot infection   AKI (acute kidney injury) ([place])   Hyponatremia   Essential hypertension   Diabetic infection of left foot ([place])   Advance diet Up with therapy IV abx per ID team WBAT in boot to left leg    Nicholes Stairs 04/01/2017, 9:42 AM   Geralynn Rile, MD [phone]

## 2017-04-01 NOTE — Progress Notes (Addendum)
PROGRESS NOTE    [name]  JSH:[id number] DOB: 04-20-1961 DOA: 03/30/2017 PCP: Pcp Not In System    Brief Narrative:  Pt is a 57 y/o that presented with left foot plantar forefoot diabetic ulcer and left foot osteomyelitis. Ortho and ID on board.    Assessment & Plan:   Principal Problem:   Osteomyelitis ([place]) - Ortho and ID on board -  Pt is s/p:  1.  Left plantar forefoot diabetic ulcer 2.  Left foot osteomyelitis 3.  Tight left heelcord 4.  Left foot deep abscess  Active Problems:   Anemia of chronic disease - cbc next am suspected DIP in hemoglobin levels especially after operation - stable  Leukocytosis - addendum: in context of left foot osteomyelitis s/p operation listed above. ID on board. Plan is to await cultures to see if we can narrow antibiotic coverage. Currently will continue pip/tazo. Patient will need 4 wks of therapy - reassess next am.      Acute on chronic kidney disease stage 4, GFR 15-29 ml/min (HCC) - Continue renally dose antibiotics - Continue gentle fluid rehydration and reassess serum creatinine next a.m.    Hyponatremia - Improving with IV fluid hydration. We'll continue reassess next a.m.    Essential hypertension - Continue current antihypertensive regimen. Patient may require further titration. - We'll add when necessary hypertensive medication    DVT prophylaxis:Lovenox Code Status: Full Family Communication: Discussed directly with patient Disposition Plan: Once cleared for d/c by Ortho, suspect within the next 1-2 days   Consultants:   ID and Ortho  Procedures: None  Antimicrobials: Zosyn   Subjective: Pt has no new complaints. No acute issues overnight.  Objective: Vitals:   03/31/17 1300 03/31/17 2103 04/01/17 0134 04/01/17 0525  BP: (!) 174/71 (!) 148/69 (!) 147/69 (!) 154/67  Pulse: 76 79 72 77  Resp: 16 16 16 16   Temp: 98.1 F (36.7 C) 100.2 F (37.9 C) 99.9 F (37.7 C) 100 F (37.8 C)  TempSrc:  Oral Oral Oral Oral  SpO2: 96% 95% 96% 95%  Weight:      Height:        Intake/Output Summary (Last 24 hours) at 04/01/17 1309 Last data filed at 04/01/17 1258  Gross per 24 hour  Intake           819.33 ml  Output             2581 ml  Net         -1761.67 ml   Filed Weights   03/31/17 0007  Weight: 106.6 kg (235 lb 1.6 oz)    Examination:  General exam: Appears calm and comfortable, in nad. Respiratory system: Clear to auscultation. Respiratory effort normal. Cardiovascular system: S1 & S2 heard, RRR. No JVD, murmurs, rubs, gallops or clicks. No pedal edema. Gastrointestinal system: Abdomen is nondistended, soft and nontender. No organomegaly or masses felt. Normal bowel sounds heard. Central nervous system: Alert and oriented. No focal neurological deficits. Extremities: Symmetric 5 x 5 power. Pt is s/p TMA of left foot. Skin: No rashes, warm and dry Psychiatry: Judgement and insight appear normal. Mood & affect appropriate.   Data Reviewed: I have personally reviewed following labs and imaging studies  CBC:  Recent Labs Lab 03/27/17 0708 03/30/17 2058 03/31/17 0004 04/01/17 0534  WBC  --  18.2* 17.9* 19.3*  NEUTROABS  --  14.5*  --   --   HGB 9.9* 8.7* 8.5* 8.0*  HCT 29.0* 27.1* 25.9* 25.5*  MCV  --  70.0* 70.0* 70.2*  PLT  --  264 272 660   Basic Metabolic Panel:  Recent Labs Lab 03/27/17 0708 03/30/17 2058 03/31/17 0004 04/01/17 0534  NA 139 132* 134* 135  K 4.3 4.3 4.6 4.2  CL  --  101 103 105  CO2  --  16* 17* 17*  GLUCOSE 140* 159* 157* 113*  BUN  --  91* 90* 85*  CREATININE  --  5.34* 5.30* 5.26*  CALCIUM  --  8.5* 8.3* 7.9*   GFR: Estimated Creatinine Clearance: 20.5 mL/min (A) (by C-G formula based on SCr of 5.26 mg/dL (H)). Liver Function Tests:  Recent Labs Lab 03/30/17 2058  AST 21  ALT 22  ALKPHOS 68  BILITOT 0.5  PROT 7.2  ALBUMIN 2.6*   No results for input(s): LIPASE, AMYLASE in the last 168 hours. No results for  input(s): AMMONIA in the last 168 hours. Coagulation Profile:  Recent Labs Lab 03/31/17 0004  INR 1.17   Cardiac Enzymes: No results for input(s): CKTOTAL, CKMB, CKMBINDEX, TROPONINI in the last 168 hours. BNP (last 3 results) No results for input(s): PROBNP in the last 8760 hours. HbA1C: No results for input(s): HGBA1C in the last 72 hours. CBG:  Recent Labs Lab 03/31/17 2102 04/01/17 0137 04/01/17 0528 04/01/17 0751 04/01/17 1209  GLUCAP 187* 131* 107* 139* 147*   Lipid Profile: No results for input(s): CHOL, HDL, LDLCALC, TRIG, CHOLHDL, LDLDIRECT in the last 72 hours. Thyroid Function Tests: No results for input(s): TSH, T4TOTAL, FREET4, T3FREE, THYROIDAB in the last 72 hours. Anemia Panel: No results for input(s): VITAMINB12, FOLATE, FERRITIN, TIBC, IRON, RETICCTPCT in the last 72 hours. Sepsis Labs:  Recent Labs Lab 03/30/17 2116  LATICACIDVEN 0.66    Recent Results (from the past 240 hour(s))  Blood Culture (routine x 2)     Status: None (Preliminary result)   Collection Time: 03/30/17  8:58 PM  Result Value Ref Range Status   Specimen Description BLOOD RIGHT WRIST  Final   Special Requests   Final    BOTTLES DRAWN AEROBIC AND ANAEROBIC Blood Culture adequate volume 5CC EACH   Culture NO GROWTH < 24 HOURS  Final   Report Status PENDING  Incomplete  Blood Culture (routine x 2)     Status: None (Preliminary result)   Collection Time: 03/30/17  9:08 PM  Result Value Ref Range Status   Specimen Description BLOOD RIGHT HAND  Final   Special Requests   Final    IN PEDIATRIC BOTTLE Blood Culture adequate volume 3CC   Culture NO GROWTH < 24 HOURS  Final   Report Status PENDING  Incomplete  Wound or Superficial Culture     Status: Abnormal (Preliminary result)   Collection Time: 03/30/17 10:32 PM  Result Value Ref Range Status   Specimen Description WOUND FOOT  Final   Special Requests NONE  Final   Gram Stain   Final    RARE WBC PRESENT, PREDOMINANTLY  PMN MODERATE GRAM POSITIVE COCCI    Culture MULTIPLE ORGANISMS PRESENT, NONE PREDOMINANT (A)  Final   Report Status PENDING  Incomplete  Surgical pcr screen     Status: None   Collection Time: 03/31/17  1:08 AM  Result Value Ref Range Status   MRSA, PCR NEGATIVE NEGATIVE Final   Staphylococcus aureus NEGATIVE NEGATIVE Final    Comment:        The Xpert SA Assay (FDA approved for NASAL specimens in patients over 21 years of  age), is one component of a comprehensive surveillance program.  Test performance has been validated by [facility] for patients greater than or equal to 110 year old. It is not intended to diagnose infection nor to guide or monitor treatment.          Radiology Studies: Dg Chest Port 1 View  Result Date: 03/30/2017 CLINICAL DATA:  Preop chest x-ray for foot ulcer history of hypertension and diabetes EXAM: PORTABLE CHEST 1 VIEW COMPARISON:  08/20/2013 FINDINGS: The heart size and mediastinal contours are within normal limits. Both lungs are clear. The visualized skeletal structures are unremarkable. IMPRESSION: No active disease. Electronically Signed   By: Donavan Foil M.D.   On: 03/30/2017 23:55   Dg Foot Complete Left  Result Date: 03/30/2017 CLINICAL DATA:  Wound  lower on bottom of left foot EXAM: LEFT FOOT - COMPLETE 3+ VIEW COMPARISON:  MRI 01/22/2017, radiographs 01/20/2017 FINDINGS: Patient is status post amputation of the second digit at the level of the MTP joint. Scattered pockets of soft tissue gas along the plantar surface of the second through fourth digits. Chronic deformity of the distal fourth metatarsal. Partial amputation of the distal fourth digit as before. Possible erosive change at the base of the third proximal phalanx. Vascular calcifications IMPRESSION: 1. Possible erosive change at the base of the third proximal phalanx, with possible articular extension to the third MTP joint raises possibility for osteomyelitis/joint infection. 2.  Soft tissue gas along the plantar surface of the second through fourth digits distally Electronically Signed   By: Donavan Foil M.D.   On: 03/30/2017 21:52    Scheduled Meds: . amLODipine  10 mg Oral QHS  . atorvastatin  10 mg Oral QHS  . carvedilol  25 mg Oral BID WC  . enoxaparin (LOVENOX) injection  30 mg Subcutaneous Q24H  . ferrous sulfate  325 mg Oral Q breakfast  . gabapentin  300 mg Oral TID  . insulin aspart  0-15 Units Subcutaneous Q4H  . insulin glargine  15 Units Subcutaneous BID  . sodium bicarbonate  650 mg Oral BID   Continuous Infusions: . sodium chloride 10 mL/hr at 03/31/17 0701  . piperacillin-tazobactam (ZOSYN)  IV Stopped (04/01/17 1017)     LOS: 2 days    Time spent: > 35 minutes  Velvet Bathe, MD [facility] Pager [phone]  If 7PM-7AM, please contact night-coverage [email or web address] Password [email or web address] 04/01/2017, 1:09 PM

## 2017-04-01 NOTE — Care Management Note (Signed)
Case Management Note  Patient Details  Name: [name] MRN: [id number] Date of Birth: 1961-07-05  Subjective/Objective:    POD #1 .  Left percutaneous heelcord lengthening  2.  Left foot transmetatarsal amputation, and 3.  Irrigation and excisional debridement of deep left foot abscess including multiple areas. Awaiting PT eval for DME and other HH needs. Spoke with pt who has chosen [facility] to provide all home health at discharge.                  Action/Plan:CM will follow closely for disposition/discharge needs.    Expected Discharge Date:                  Expected Discharge Plan:     In-House Referral:     Discharge planning Services     Post Acute Care Choice:    Choice offered to:     DME Arranged:    DME Agency:     HH Arranged:    HH Agency:     Status of Service:     If discussed at [employer], dates discussed:    Additional Comments:  Delrae Sawyers, RN 04/01/2017, 10:04 AM

## 2017-04-01 NOTE — Evaluation (Signed)
Physical Therapy Evaluation Patient Details Name: [name] MRN: [id number] DOB: [birth year] Today's Date: 04/01/2017   History of Present Illness  Pt is a 56 y/o that presented with left foot plantar forefoot diabetic ulcer and left foot osteomyelitis.  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  PMH:  Chronic kidney dx, hyponatremia, HTN  Clinical Impression  Pt admitted with above diagnosis. Pt currently with functional limitations due to the deficits listed below (see PT Problem List). Pt was able to ambulate in room. Plans to go home with assist and will need wheelchair and RW.  Overall, good safety with RW.  HHPT f/u recommended.  Pt will benefit from skilled PT to increase their independence and safety with mobility to allow discharge to the venue listed below.      Follow Up Recommendations Home health PT;Supervision/Assistance - 24 hour    Equipment Recommendations  Rolling walker with 5" wheels;Wheelchair(18x16 lightweight wc,antitippers, elevating legrests, desk arms)   ;Wheelchair cushion -pressure relieving   Recommendations for Other Services       Precautions / Restrictions Precautions Precautions: Fall Precaution Comments: JP drain leftLE  Required Braces or Orthoses: Other Brace/Splint Other Brace/Splint: CAM boot left LE with ambulation, AFO for right LE. Restrictions Weight Bearing Restrictions: Yes [name] Weight Bearing: Weight bearing as tolerated      Mobility  Bed Mobility Overal bed mobility: Needs Assistance Bed Mobility: Supine to Sit     Supine to sit: Independent        Transfers Overall transfer level: Needs assistance Equipment used: Rolling walker (2 wheeled) Transfers: Sit to/from Stand Sit to Stand: Min guard         General transfer comment: No assist needed but did guard pt for safety with transition.  Ambulation/Gait Ambulation/Gait assistance: Min guard Ambulation Distance (Feet): 18 Feet Assistive device:  Rolling walker (2 wheeled) Gait Pattern/deviations: Step-to pattern;Decreased step length - left;Decreased stance time - left;Decreased weight shift to left;Decreased dorsiflexion - left;Antalgic   Gait velocity interpretation: Below normal speed for age/gender General Gait Details: Pt was able to ambulate with RW with min guard assist with cues to sequence steps and RW.  Pt initially safe with RW but did need cues when turning as he moved RW quickly to turn.  He did not lose his balance but PT did cue pt to not turn so quickly.  Overall, safe with RW.  Pt wants to go home.     Stairs            Wheelchair Mobility    Modified Rankin (Stroke Patients Only)       Balance Overall balance assessment: Needs assistance;History of Falls Sitting-balance support: No upper extremity supported;Feet supported Sitting balance-Leahy Scale: Fair     Standing balance support: Bilateral upper extremity supported;During functional activity Standing balance-Leahy Scale: Poor Standing balance comment: relies on UE support for balance             High level balance activites: Direction changes;Turns;Sudden stops High Level Balance Comments: min guard assist and cues especially with turns.              Pertinent Vitals/Pain Pain Assessment: Faces Faces Pain Scale: Hurts even more Pain Location: left foot Pain Descriptors / Indicators: Aching;Discomfort;Grimacing;Guarding;Operative site guarding;Sore Pain Intervention(s): Limited activity within patient's tolerance;Monitored during session;Repositioned    Home Living Family/patient expects to be discharged to:: Private residence Living Arrangements: Spouse/significant other Available Help at Discharge: Family Type of Home: House Home Access: Level entry  Home Layout: One level Home Equipment: None Additional Comments: states wife will be available for him    Prior Function Level of Independence: Independent with assistive  device(s)         Comments: Was using wheelchair a week prior to coming to hospital due to pain in left LE. they rented the wheelchair.  He is unsure if wife took the wheelchair back.       Hand Dominance        Extremity/Trunk Assessment   Upper Extremity Assessment Upper Extremity Assessment: Defer to OT evaluation    Lower Extremity Assessment Lower Extremity Assessment: [name] deficits/detail [name] Deficits / Details: NT due to CAM boot in place [name]: Unable to fully assess due to immobilization    Cervical / Trunk Assessment Cervical / Trunk Assessment: Normal  Communication   Communication: No difficulties  Cognition Arousal/Alertness: Awake/alert Behavior During Therapy: Impulsive Overall Cognitive Status: Within Functional Limits for tasks assessed Area of Impairment: Safety/judgement;Problem solving                         Safety/Judgement: Decreased awareness of safety   Problem Solving: Difficulty sequencing;Requires verbal cues        General Comments      Exercises General Exercises - Lower Extremity Long Arc Quad: AROM;Both;10 reps;Supine Heel Slides: AROM;Both;10 reps;Supine   Assessment/Plan    PT Assessment Patient needs continued PT services  PT Problem List Decreased strength;Decreased activity tolerance;Decreased balance;Decreased range of motion;Decreased mobility;Decreased knowledge of use of DME;Decreased safety awareness;Decreased knowledge of precautions;Pain;Decreased skin integrity       PT Treatment Interventions DME instruction;Gait training;Functional mobility training;Therapeutic activities;Therapeutic exercise;Balance training;Wheelchair mobility training;Patient/family education    PT Goals (Current goals can be found in the Care Plan section)  Acute Rehab PT Goals Patient Stated Goal: to go home PT Goal Formulation: With patient Time For Goal Achievement: 04/08/17 Potential to Achieve Goals: Good    Frequency Min  5X/week   Barriers to discharge        Co-evaluation               End of Session Equipment Utilized During Treatment: Gait belt Activity Tolerance: Patient limited by fatigue;Patient limited by pain Patient left: in bed;with call bell/phone within reach Nurse Communication: Mobility status PT Visit Diagnosis: Unsteadiness on feet (R26.81);Muscle weakness (generalized) (M62.81);Pain Pain - Right/Left: Left Pain - part of body: Leg;Ankle and joints of foot    Time: 0920-0931 PT Time Calculation (min) (ACUTE ONLY): 11 min   Charges:   PT Evaluation $PT Eval Moderate Complexity: 1 Procedure     PT G Codes:        Kerrington [place],PT Acute Rehabilitation [phone] (pager)   Denice Paradise 04/01/2017, 10:05 AM

## 2017-04-01 NOTE — Op Note (Signed)
[name], [name]              ACCOUNT NO.:  [id number]  MEDICAL RECORD NO.:  [id number]  LOCATION:  [facility]                         FACILITY:  [facility]  PHYSICIAN:  [name], MD        DATE OF BIRTH:  11-Jun-1961  DATE OF PROCEDURE:  03/31/2017 DATE OF DISCHARGE:                              OPERATIVE REPORT   PREOPERATIVE DIAGNOSES: 1. Left plantar forefoot diabetic ulcer. 2. Left foot osteomyelitis.  POSTOPERATIVE DIAGNOSES: 1. Left plantar forefoot diabetic ulcer. 2. Left foot osteomyelitis. 3. Tight left heel cord. 4. Left foot deep abscess.  PROCEDURE: 1. Left percutaneous heel cord lengthening. 2. Left foot transmetatarsal amputation. 3. Irrigation and excisional debridement of left foot deep abscess     including multiple areas.  SURGEON:  [name], MD  ANESTHESIA:  General.  ESTIMATED BLOOD LOSS:  Minimal.  TOURNIQUET TIME:  36 minutes at 250 mmHg.  COMPLICATIONS:  None apparent.  DISPOSITION:  Extubated, awake and stable to recovery.  INDICATIONS FOR PROCEDURE:  The patient is a 56 year old male, who has a history of diabetes and renal disease.  He has had previous amputations of the left foot including the second toe and the fourth metatarsal head.  He presented yesterday to the emergency department with fever and chills.  He was admitted for IV antibiotics.  He has a large ulcer on the plantar aspect of the forefoot that has failed nonoperative treatment.  Bone is palpable at the base of the wound and radiographs show osteomyelitis at the third toe.  He has a severe third hammertoe deformity.  He presents now for operative treatment of this condition. He understands the risks and benefits of the alternative treatment options and elects surgical treatment.  He specifically understands risks of bleeding, infection, nerve damage, blood clots, need for additional surgery, continued pain, recurrence of his infection, revision amputation and  death.  PROCEDURE IN DETAIL:  After preoperative consent was obtained and the correct operative site was identified, the patient was brought to the operating room and placed supine on the operating table.  General anesthesia was induced.  Preoperative antibiotics were administered. Surgical time-out was taken.  Left lower extremity was prepped and draped in standard sterile fashion with a tourniquet around the thigh. Extremity was exsanguinated and the tourniquet was inflated to 250 mmHg. The patient's ankle was then carefully examined.  He was noted to have a contracture of his Achilles.  A percutaneous Achilles tendon lengthening was then performed using the triple hemisection technique.  The ankle was then dorsiflexed to 30 degrees passively.  Attention was then turned to the forefoot where a fishmouth incision was marked on the skin to completely excise the plantar ulcer.  The incision was made and dissection was carried down through the skin and subcutaneous tissues to the level of the metatarsals.  Subperiosteal dissection was then carried along the dorsal aspect of the metatarsals, exposing them appropriately.  An oscillating saw was used to cut through the metatarsals bevelling the cuts appropriately.  The plantar soft tissues were then divided and the toes were passed off the field.  The plantar soft tissue was then carefully examined.  Flexor tendon sheath was noted  to have gross purulence extending into an abscess at the level of the plantar mid foot.  This was opened and carefully debrided in excisional fashion with curette, rongeur, scissors and scalpel.  Once all of the nonviable and necrotic appearing material was excised, the wound was carefully irrigated with 3 L of normal saline.  A flat JP drain was placed in the abscess cavity and routed out through a stab incision in the dorsal flap.  The cut surface of the bone had been smoothed with the saw blade to eliminate all  bony prominences.  Vessels were all cauterized.  The wound was then closed with simple and horizontal mattress sutures of 3-0 nylon.  Sterile dressings were applied followed by a compression wrap.  Tourniquet was released after application of the dressings at 36 minutes.  The patient was awakened from anesthesia and transported to the recovery room in stable condition.  FOLLOWUP PLAN:  The patient will be weightbearing as tolerated on his left foot and a CAM walker boot.  He will continue with IV antibiotics per the Infectious Disease team.     [name], MD     JH/MEDQ  D:  03/31/2017  T:  04/01/2017  Job:  [id number]

## 2017-04-01 NOTE — Care Management Note (Addendum)
Case Management Note  Patient Details  Name: ETAN VASUDEVAN MRN: [id number] Date of Birth: 1960-12-21  Subjective/Objective:  PT recommending HHPT, RW and lightweight Manual WC which CM ordered from [facility].                   Action/Plan:CM will sign off for now but will be available should additional discharge needs arise or disposition change.    Expected Discharge Date:                  Expected Discharge Plan:  [place]  In-House Referral:     Discharge planning Services  CM Consult  Post Acute Care Choice:  Durable Medical Equipment, Home Health Choice offered to:  Patient  DME Arranged:  [employer] wheelchair with seat cushion, Walker rolling DME Agency:  [place]:  PT [place]:  [place]  Status of Service:  Completed, signed off  If discussed at [place] of Stay Meetings, dates discussed:    Additional Comments:  Delrae Sawyers, RN 04/01/2017, 10:16 AM

## 2017-04-02 DIAGNOSIS — E11621 Type 2 diabetes mellitus with foot ulcer: Secondary | ICD-10-CM

## 2017-04-02 DIAGNOSIS — L97529 Non-pressure chronic ulcer of other part of left foot with unspecified severity: Secondary | ICD-10-CM

## 2017-04-02 DIAGNOSIS — Z89432 Acquired absence of left foot: Secondary | ICD-10-CM

## 2017-04-02 DIAGNOSIS — L02612 Cutaneous abscess of left foot: Secondary | ICD-10-CM

## 2017-04-02 LAB — CBC
HEMATOCRIT: 24.3 % — AB (ref 39.0–52.0)
Hemoglobin: 7.9 g/dL — ABNORMAL LOW (ref 13.0–17.0)
MCH: 22.8 pg — ABNORMAL LOW (ref 26.0–34.0)
MCHC: 32.5 g/dL (ref 30.0–36.0)
MCV: 70.2 fL — AB (ref 78.0–100.0)
PLATELETS: 268 10*3/uL (ref 150–400)
RBC: 3.46 MIL/uL — ABNORMAL LOW (ref 4.22–5.81)
RDW: 14.5 % (ref 11.5–15.5)
WBC: 17.7 10*3/uL — AB (ref 4.0–10.5)

## 2017-04-02 LAB — AEROBIC CULTURE W GRAM STAIN (SUPERFICIAL SPECIMEN)

## 2017-04-02 LAB — AEROBIC CULTURE  (SUPERFICIAL SPECIMEN)

## 2017-04-02 LAB — GLUCOSE, CAPILLARY
GLUCOSE-CAPILLARY: 164 mg/dL — AB (ref 65–99)
Glucose-Capillary: 91 mg/dL (ref 65–99)
Glucose-Capillary: 127 mg/dL — ABNORMAL HIGH (ref 65–99)
Glucose-Capillary: 146 mg/dL — ABNORMAL HIGH (ref 65–99)
Glucose-Capillary: 156 mg/dL — ABNORMAL HIGH (ref 65–99)

## 2017-04-02 LAB — HEPATITIS C ANTIBODY: HCV Ab: >11 s/co ratio — ABNORMAL HIGH (ref 0.0–0.9)

## 2017-04-02 LAB — HIV ANTIBODY (ROUTINE TESTING W REFLEX): HIV SCREEN 4TH GENERATION: NONREACTIVE

## 2017-04-02 NOTE — Discharge Instructions (Signed)
[name], MD [place]  Please read the following information regarding your care after surgery.  Medications  You only need a prescription for the narcotic pain medicine (ex. oxycodone, Percocet, Norco).  All of the other medicines listed below are available over the counter. X acetominophen (Tylenol) 650 mg every 4-6 hours as you need for minor pain    Narcotic pain medicine (ex. oxycodone, Percocet, Vicodin) will cause constipation.  To prevent this problem, take the following medicines while you are taking any pain medicine. X docusate sodium (Colace) 100 mg twice a day X senna (Senokot) 2 tablets twice a day  Weight Bearing X Bear weight only on the heel of your operated foot in the CAM boot.   Cast / Splint / Dressing X Keep your dressing clean and dry.  Dont put anything (coat hanger, pencil, etc) down inside of it.  If it gets damp, use a hair dryer on the cool setting to dry it.  If it gets soaked, call the office to schedule an appointment for a dressing change.   After your dressing, cast or splint is removed; you may shower, but do not soak or scrub the wound.  Allow the water to run over it, and then gently pat it dry.  Swelling It is normal for you to have swelling where you had surgery.  To reduce swelling and pain, keep your toes above your nose for at least 3 days after surgery.  It may be necessary to keep your foot or leg elevated for several weeks.  If it hurts, it should be elevated.  Follow Up Call my office at [phone] when you are discharged from the hospital or surgery center to schedule an appointment to be seen two weeks after surgery.  Call my office at [phone] if you develop a fever >101.5 F, nausea, vomiting, bleeding from the surgical site or severe pain.

## 2017-04-02 NOTE — Progress Notes (Signed)
PROGRESS NOTE    [name]  YCX:[id number] DOB: 1961-09-15 DOA: 03/30/2017 PCP: Pcp Not In System    Brief Narrative:  Pt is a 56 y/o that presented with left foot plantar forefoot diabetic ulcer and left foot osteomyelitis. Ortho and ID on board.    Assessment & Plan:   Principal Problem:   Osteomyelitis ([place]) - Ortho and ID on board -  Pt is s/p:  1.  Left plantar forefoot diabetic ulcer 2.  Left foot osteomyelitis 3.  Tight left heelcord 4.  Left foot deep abscess Please see discussion under leukocytosis  Active Problems:   Anemia of chronic disease - cbc next am suspected DIP in hemoglobin levels especially after operation - stable  Leukocytosis - ID on board and recommending 4 weeks of IV antibiotics unable to place picc due to renal history (new fistula placed a couple of weeks ago) - Pt will require Zosyn - So currently the discussion is for where we can place access so patient can obtain IV antibiotics      Acute on chronic kidney disease stage 4, GFR 15-29 ml/min (HCC) - Continue renally dose antibiotics - Continue gentle fluid rehydration and reassess serum creatinine next a.m.    Hyponatremia - resolved    Essential hypertension - Continue current antihypertensive regimen. Patient may require further titration. - We'll add when necessary hypertensive medication    DVT prophylaxis:Lovenox Code Status: Full Family Communication: Discussed directly with patient Disposition Plan: Once game plan clear for discharge and patient has access for IV antibiotics administration. And once ok from ortho standpoint.   Consultants:   ID and Ortho  Procedures: None  Antimicrobials: Zosyn   Subjective: Pt has no new complaints. No acute issues overnight.  Objective: Vitals:   04/01/17 0525 04/01/17 1533 04/01/17 2101 04/02/17 0501  BP: (!) 154/67 (!) 149/67 (!) 154/69 (!) 164/72  Pulse: 77 72 74 74  Resp: 16 16 16 16   Temp: 100 F (37.8 C) 99.4  F (37.4 C) 99.4 F (37.4 C) 99.3 F (37.4 C)  TempSrc: Oral Oral Oral Oral  SpO2: 95% 97% 100% 96%  Weight:      Height:        Intake/Output Summary (Last 24 hours) at 04/02/17 1657 Last data filed at 04/02/17 0809  Gross per 24 hour  Intake              600 ml  Output              400 ml  Net              200 ml   Filed Weights   03/31/17 0007  Weight: 106.6 kg (235 lb 1.6 oz)    Examination:  General exam: Appears calm and comfortable, in nad. Respiratory system: Clear to auscultation. Respiratory effort normal. Cardiovascular system: S1 & S2 heard, RRR. No JVD, murmurs, rubs, gallops or clicks. No pedal edema. Gastrointestinal system: Abdomen is nondistended, soft and nontender. No organomegaly or masses felt. Normal bowel sounds heard. Central nervous system: Alert and oriented. No focal neurological deficits. Extremities: Symmetric 5 x 5 power. Pt is s/p TMA of left foot. Skin: No rashes, warm and dry Psychiatry: Judgement and insight appear normal. Mood & affect appropriate.   Data Reviewed: I have personally reviewed following labs and imaging studies  CBC:  Recent Labs Lab 03/27/17 0708 03/30/17 2058 03/31/17 0004 04/01/17 0534 04/02/17 0014  WBC  --  18.2* 17.9* 19.3* 17.7*  NEUTROABS  --  14.5*  --   --   --   HGB 9.9* 8.7* 8.5* 8.0* 7.9*  HCT 29.0* 27.1* 25.9* 25.5* 24.3*  MCV  --  70.0* 70.0* 70.2* 70.2*  PLT  --  264 272 262 762   Basic Metabolic Panel:  Recent Labs Lab 03/27/17 0708 03/30/17 2058 03/31/17 0004 04/01/17 0534  NA 139 132* 134* 135  K 4.3 4.3 4.6 4.2  CL  --  101 103 105  CO2  --  16* 17* 17*  GLUCOSE 140* 159* 157* 113*  BUN  --  91* 90* 85*  CREATININE  --  5.34* 5.30* 5.26*  CALCIUM  --  8.5* 8.3* 7.9*   GFR: Estimated Creatinine Clearance: 20.5 mL/min (A) (by C-G formula based on SCr of 5.26 mg/dL (H)). Liver Function Tests:  Recent Labs Lab 03/30/17 2058  AST 21  ALT 22  ALKPHOS 68  BILITOT 0.5  PROT 7.2   ALBUMIN 2.6*   No results for input(s): LIPASE, AMYLASE in the last 168 hours. No results for input(s): AMMONIA in the last 168 hours. Coagulation Profile:  Recent Labs Lab 03/31/17 0004  INR 1.17   Cardiac Enzymes: No results for input(s): CKTOTAL, CKMB, CKMBINDEX, TROPONINI in the last 168 hours. BNP (last 3 results) No results for input(s): PROBNP in the last 8760 hours. HbA1C:  Recent Labs  03/31/17 0004  HGBA1C 7.0*   CBG:  Recent Labs Lab 04/01/17 2105 04/02/17 0103 04/02/17 0507 04/02/17 1125 04/02/17 1623  GLUCAP 153* 91 127* 146* 156*   Lipid Profile: No results for input(s): CHOL, HDL, LDLCALC, TRIG, CHOLHDL, LDLDIRECT in the last 72 hours. Thyroid Function Tests: No results for input(s): TSH, T4TOTAL, FREET4, T3FREE, THYROIDAB in the last 72 hours. Anemia Panel: No results for input(s): VITAMINB12, FOLATE, FERRITIN, TIBC, IRON, RETICCTPCT in the last 72 hours. Sepsis Labs:  Recent Labs Lab 03/30/17 2116  LATICACIDVEN 0.66    Recent Results (from the past 240 hour(s))  Blood Culture (routine x 2)     Status: None (Preliminary result)   Collection Time: 03/30/17  8:58 PM  Result Value Ref Range Status   Specimen Description BLOOD RIGHT WRIST  Final   Special Requests   Final    BOTTLES DRAWN AEROBIC AND ANAEROBIC Blood Culture adequate volume 5CC EACH   Culture NO GROWTH 3 DAYS  Final   Report Status PENDING  Incomplete  Blood Culture (routine x 2)     Status: None (Preliminary result)   Collection Time: 03/30/17  9:08 PM  Result Value Ref Range Status   Specimen Description BLOOD RIGHT HAND  Final   Special Requests   Final    IN PEDIATRIC BOTTLE Blood Culture adequate volume 3CC   Culture NO GROWTH 3 DAYS  Final   Report Status PENDING  Incomplete  Wound or Superficial Culture     Status: Abnormal   Collection Time: 03/30/17 10:32 PM  Result Value Ref Range Status   Specimen Description WOUND FOOT  Final   Special Requests NONE  Final     Gram Stain   Final    RARE WBC PRESENT, PREDOMINANTLY PMN MODERATE GRAM POSITIVE COCCI    Culture (A)  Final    MULTIPLE ORGANISMS PRESENT, NONE PREDOMINANT NO STAPHYLOCOCCUS AUREUS ISOLATED NO GROUP A STREP (S.PYOGENES) ISOLATED    Report Status 04/02/2017 FINAL  Final  Surgical pcr screen     Status: None   Collection Time: 03/31/17  1:08 AM  Result Value Ref Range Status  MRSA, PCR NEGATIVE NEGATIVE Final   Staphylococcus aureus NEGATIVE NEGATIVE Final    Comment:        The Xpert SA Assay (FDA approved for NASAL specimens in patients over 58 years of age), is one component of a comprehensive surveillance program.  Test performance has been validated by [facility] for patients greater than or equal to 43 year old. It is not intended to diagnose infection nor to guide or monitor treatment.          Radiology Studies: No results found.  Scheduled Meds: . amLODipine  10 mg Oral QHS  . atorvastatin  10 mg Oral QHS  . carvedilol  25 mg Oral BID WC  . enoxaparin (LOVENOX) injection  30 mg Subcutaneous Q24H  . ferrous sulfate  325 mg Oral Q breakfast  . gabapentin  300 mg Oral TID  . insulin aspart  0-15 Units Subcutaneous Q4H  . insulin glargine  15 Units Subcutaneous BID  . sodium bicarbonate  650 mg Oral BID   Continuous Infusions: . sodium chloride 10 mL/hr at 04/02/17 1211  . piperacillin-tazobactam (ZOSYN)  IV Stopped (04/02/17 1313)     LOS: 3 days    Time spent: > 35 minutes  Velvet Bathe, MD [facility] Pager [phone]  If 7PM-7AM, please contact night-coverage [email or web address] Password [facility] 04/02/2017, 4:57 PM

## 2017-04-02 NOTE — Progress Notes (Signed)
IV  -PICC team called RN to say they were not able to place PICC line in patient until approval from Nephrologist. Pt currently has new dialysis graft in left forearm that was recently placed for potential dialysis needs.   RN placed call to Dr. Ulice Bold office and left message regarding concern and phone number for Dr. Florene Glen or associate to return call to give approval or disapproval for bedside placement of PICC as opposed to IR placement.  Awaiting advisement.

## 2017-04-02 NOTE — Progress Notes (Signed)
INFECTIOUS DISEASE PROGRESS NOTE  ID: [name] is a 56 y.o. male with  Principal Problem:   Osteomyelitis ([place]) Active Problems:   Anemia of chronic disease   CKD (chronic kidney disease) stage 4, GFR 15-29 ml/min (HCC)   Left foot infection   AKI (acute kidney injury) ([place])   Hyponatremia   Essential hypertension   Diabetic infection of left foot (HCC)  Subjective: No complaints.   Abtx:  Anti-infectives    Start     Dose/Rate Route Frequency Ordered Stop   04/01/17 2200  vancomycin (VANCOCIN) 1,500 mg in sodium chloride 0.9 % 500 mL IVPB  Status:  Discontinued     1,500 mg 250 mL/hr over 120 Minutes Intravenous Every 48 hours 03/30/17 2239 03/31/17 1124   03/31/17 1400  piperacillin-tazobactam (ZOSYN) IVPB 2.25 g     2.25 g 100 mL/hr over 30 Minutes Intravenous Every 8 hours 03/31/17 1238     03/30/17 2245  piperacillin-tazobactam (ZOSYN) IVPB 3.375 g     3.375 g 100 mL/hr over 30 Minutes Intravenous  Once 03/30/17 2231 03/30/17 2343   03/30/17 2245  vancomycin (VANCOCIN) 2,000 mg in sodium chloride 0.9 % 500 mL IVPB     2,000 mg 250 mL/hr over 120 Minutes Intravenous  Once 03/30/17 2233 03/31/17 0356      Medications:  Scheduled: . amLODipine  10 mg Oral QHS  . atorvastatin  10 mg Oral QHS  . carvedilol  25 mg Oral BID WC  . enoxaparin (LOVENOX) injection  30 mg Subcutaneous Q24H  . ferrous sulfate  325 mg Oral Q breakfast  . gabapentin  300 mg Oral TID  . insulin aspart  0-15 Units Subcutaneous Q4H  . insulin glargine  15 Units Subcutaneous BID  . sodium bicarbonate  650 mg Oral BID    Objective: Vital signs in last 24 hours: Temp:  [99.3 F (37.4 C)-99.4 F (37.4 C)] 99.3 F (37.4 C) (04/30 0501) Pulse Rate:  [72-74] 74 (04/30 0501) Resp:  [16] 16 (04/30 0501) BP: (149-164)/(67-72) 164/72 (04/30 0501) SpO2:  [96 %-100 %] 96 % (04/30 0501)   General appearance: alert and no distress Incision/Wound: L foot dressed. R foot well healed.    Skin: No proximal erythema  Lab Results  Recent Labs  03/31/17 0004 04/01/17 0534 04/02/17 0014  WBC 17.9* 19.3* 17.7*  HGB 8.5* 8.0* 7.9*  HCT 25.9* 25.5* 24.3*  NA 134* 135  --   K 4.6 4.2  --   CL 103 105  --   CO2 17* 17*  --   BUN 90* 85*  --   CREATININE 5.30* 5.26*  --    Liver Panel  Recent Labs  03/30/17 2058  PROT 7.2  ALBUMIN 2.6*  AST 21  ALT 22  ALKPHOS 68  BILITOT 0.5   Sedimentation Rate  Recent Labs  04/01/17 0534  ESRSEDRATE 114*   C-Reactive Protein  Recent Labs  04/01/17 0534  CRP 15.7*    Microbiology: Recent Results (from the past 240 hour(s))  Blood Culture (routine x 2)     Status: None (Preliminary result)   Collection Time: 03/30/17  8:58 PM  Result Value Ref Range Status   Specimen Description BLOOD RIGHT WRIST  Final   Special Requests   Final    BOTTLES DRAWN AEROBIC AND ANAEROBIC Blood Culture adequate volume 5CC EACH   Culture NO GROWTH 2 DAYS  Final   Report Status PENDING  Incomplete  Blood Culture (routine x  2)     Status: None (Preliminary result)   Collection Time: 03/30/17  9:08 PM  Result Value Ref Range Status   Specimen Description BLOOD RIGHT HAND  Final   Special Requests   Final    IN PEDIATRIC BOTTLE Blood Culture adequate volume 3CC   Culture NO GROWTH 2 DAYS  Final   Report Status PENDING  Incomplete  Wound or Superficial Culture     Status: Abnormal (Preliminary result)   Collection Time: 03/30/17 10:32 PM  Result Value Ref Range Status   Specimen Description WOUND FOOT  Final   Special Requests NONE  Final   Gram Stain   Final    RARE WBC PRESENT, PREDOMINANTLY PMN MODERATE GRAM POSITIVE COCCI    Culture MULTIPLE ORGANISMS PRESENT, NONE PREDOMINANT (A)  Final   Report Status PENDING  Incomplete  Surgical pcr screen     Status: None   Collection Time: 03/31/17  1:08 AM  Result Value Ref Range Status   MRSA, PCR NEGATIVE NEGATIVE Final   Staphylococcus aureus NEGATIVE NEGATIVE Final     Comment:        The Xpert SA Assay (FDA approved for NASAL specimens in patients over 64 years of age), is one component of a comprehensive surveillance program.  Test performance has been validated by [facility] for patients greater than or equal to 51 year old. It is not intended to diagnose infection nor to guide or monitor treatment.     Studies/Results: No results found.   Assessment/Plan: DM foot ulcer with abscess Transmetatarsal amputation  Total days of antibiotics: 2 zosyn  Plan for 4 weeks of IV anbx Await Cx Will place [facility] Needs CSW help for home IV anbx HIV (-)         [name] Infectious Diseases (pager) [phone] [email or web address] 04/02/2017, 11:37 AM  LOS: 3 days

## 2017-04-02 NOTE — Progress Notes (Signed)
Talk with Dr. Jonnie Finner concerning PICC order and patient with new fistula placed a couple of weeks ago. Order received to D/C PICC order and inform Dr. Joelyn Oms. RN made aware and will follow up with Dr. Joelyn Oms.

## 2017-04-02 NOTE — Progress Notes (Signed)
RN spoke with Dr. Joelyn Oms re PICC placement for pt. MD states that pt cannot have PICC.   Patient would require tunneled IJ cath placed.   Dr. Joelyn Oms would be willing to assist with order placement after speaking to care team.

## 2017-04-02 NOTE — Progress Notes (Signed)
Subjective: 2 Days Post-Op Procedure(s) (LRB): Transmetatarsal amputation left foot (Left)  Patient reports pain as mild to moderate.  Denies fever, chills, N/V, SOB, CP.  Tolerating POs well.  Admits to flatulence.  Continues to complain of L ankle pain, however he reports that the pain as improved since surgery.  Objective:   VITALS:  Temp:  [99.3 F (37.4 C)-99.4 F (37.4 C)] 99.3 F (37.4 C) (04/30 0501) Pulse Rate:  [72-74] 74 (04/30 0501) Resp:  [16] 16 (04/30 0501) BP: (149-164)/(67-72) 164/72 (04/30 0501) SpO2:  [96 %-100 %] 96 % (04/30 0501)  General: WDWN patient in NAD. Psych:  Appropriate mood and affect. Neuro:  A&O x 3, Moving all extremities, sensation intact to light touch HEENT:  EOMs intact Chest:  Even non-labored respirations Skin:  Dressing C/D/I, no rashes or lesions.  Drain has scant serosanguinous fluid Extremities: warm/dry, no visible edema, erythema, or echymosis.  No lymphadenopathy. Pulses: Popliteus 2+ MSK:  ROM: full ankle ROM, MMT: patient is able to perform quad set, (-) Homan's    LABS  Recent Labs  03/30/17 2058 03/31/17 0004 04/01/17 0534 04/02/17 0014  HGB 8.7* 8.5* 8.0* 7.9*  WBC 18.2* 17.9* 19.3* 17.7*  PLT 264 272 262 268    Recent Labs  03/31/17 0004 04/01/17 0534  NA 134* 135  K 4.6 4.2  CL 103 105  CO2 17* 17*  BUN 90* 85*  CREATININE 5.30* 5.26*  GLUCOSE 157* 113*    Recent Labs  03/31/17 0004  INR 1.17     Assessment/Plan: 2 Days Post-Op Procedure(s) (LRB): Transmetatarsal amputation left foot (Left)  WBAT L LE in CAM boot Up with therapy Drain pulled. ABX per ID Plan for potential D/C home tomorrow  Plan for 2 week outpatient post-op visit with Dr. Annia Friendly, [facility] Office:  [phone]

## 2017-04-02 NOTE — Care Management Note (Addendum)
Case Management Note  Patient Details  Name: [name] MRN: [id number] Date of Birth: [birth year]  Subjective/Objective:              Spoke with patient at the bedside, he states he has not received his RW or WC. He verifies he would like to use [facility] for [facility] PT. Patient wearing boot. CM placed referral to Santiago Glad with [facility] for [facility] PT and DME. Patient will follow with [facility] for IV Abx if needed. Carolynn Sayers following for IV needs.  Awaiting final results from cultures to determine if abx can be PO. Would still need PICC if DCs home w IV Abx.      Action/Plan:  Patient will DC w [place] and DME.  Expected Discharge Date:                  Expected Discharge Plan:  [place]  In-House Referral:     Discharge planning Services  CM Consult  Post Acute Care Choice:  Durable Medical Equipment, Home Health Choice offered to:  Patient  DME Arranged:  Youth worker wheelchair with seat cushion, Walker rolling DME Agency:  [place]:  PT [place]:  [place]  Status of Service:  Completed, signed off  If discussed at [place] of Stay Meetings, dates discussed:    Additional Comments:  Carles Collet, RN 04/02/2017, 10:09 AM

## 2017-04-02 NOTE — Progress Notes (Signed)
Physical Therapy Treatment Patient Details Name: [name] MRN: [id number] DOB: 1961/04/06 Today's Date: 04/02/2017    History of Present Illness Pt is a 56 y/o that presented with left foot plantar forefoot diabetic ulcer and left foot osteomyelitis.  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  PMH:  Chronic kidney dx, hyponatremia, HTN    PT Comments    Pt fatigued on PTA arrival. Pt reported he did not sleep well and initially did not feel up for walking, but agreed with some encouragement. Pt able to perform bed mobilities independently; however was impulsive with transfers and requires supervision for safety. Pt ambulated 35 feet in the room and into the hallway with min guard. Cueing for postural control and safety with transfers. Patient would benefit from continued skilled PT. Will continue to follow acutely.      Follow Up Recommendations  Home health PT;Supervision/Assistance - 24 hour     Equipment Recommendations  Rolling walker with 5" wheels;Wheelchair (measurements PT);Wheelchair cushion (measurements PT) (18x16 lightweight wc,antitippers, elevating legrests, desk a)    Recommendations for Other Services       Precautions / Restrictions Precautions Precautions: Fall Precaution Comments: JP drain leftLE  Required Braces or Orthoses: Other Brace/Splint Other Brace/Splint: CAM boot left LE with ambulation, AFO for right LE. Restrictions Weight Bearing Restrictions: No [name] Weight Bearing: Weight bearing as tolerated    Mobility  Bed Mobility Overal bed mobility: Needs Assistance Bed Mobility: Supine to Sit     Supine to sit: Independent        Transfers Overall transfer level: Needs assistance Equipment used: Rolling walker (2 wheeled) Transfers: Sit to/from Stand Sit to Stand: Supervision         General transfer comment: No assist needed. Pt powered up it standing without waiting on PTA. Supervision for safety as pt is  impulsive.  Ambulation/Gait Ambulation/Gait assistance: Min guard Ambulation Distance (Feet): 35 Feet Assistive device: Rolling walker (2 wheeled) Gait Pattern/deviations: Step-to pattern;Decreased step length - left;Decreased stance time - left;Decreased weight shift to left;Decreased dorsiflexion - left;Antalgic   Gait velocity interpretation: Below normal speed for age/gender General Gait Details: Pt was able to ambulate with RW with min guard assist with cues on postural control.  Overall, safe with RW.  pt fatigues quickly. however reports this is his baseline.     Stairs            Wheelchair Mobility    Modified Rankin (Stroke Patients Only)       Balance Overall balance assessment: Needs assistance;History of Falls Sitting-balance support: No upper extremity supported;Feet supported Sitting balance-Leahy Scale: Fair     Standing balance support: Bilateral upper extremity supported;During functional activity Standing balance-Leahy Scale: Poor Standing balance comment: relies on UE support for balance                            Cognition Arousal/Alertness: Awake/alert Behavior During Therapy: Impulsive Overall Cognitive Status: Within Functional Limits for tasks assessed Area of Impairment: Safety/judgement;Problem solving                         Safety/Judgement: Decreased awareness of safety            Exercises      General Comments        Pertinent Vitals/Pain Pain Assessment: 0-10 Pain Score: 8  (when sitting up or standing) Pain Location: left foot Pain Descriptors / Indicators: Aching;Discomfort;Grimacing;Guarding;Operative  site guarding;Sore Pain Intervention(s): Monitored during session;Limited activity within patient's tolerance;Premedicated before session;Repositioned    Home Living                      Prior Function            PT Goals (current goals can now be found in the care plan section) Acute  Rehab PT Goals Patient Stated Goal: to go home PT Goal Formulation: With patient Time For Goal Achievement: 04/08/17 Potential to Achieve Goals: Good    Frequency    Min 5X/week      PT Plan Current plan remains appropriate    Co-evaluation              AM-PAC PT "6 Clicks" Daily Activity  Outcome Measure  Difficulty turning over in bed (including adjusting bedclothes, sheets and blankets)?: None Difficulty moving from lying on back to sitting on the side of the bed? : None Difficulty sitting down on and standing up from a chair with arms (e.g., wheelchair, bedside commode, etc,.)?: A Little Help needed moving to and from a bed to chair (including a wheelchair)?: A Little Help needed walking in hospital room?: A Little Help needed climbing 3-5 steps with a railing? : A Lot 6 Click Score: 19    End of Session Equipment Utilized During Treatment: Gait belt Activity Tolerance: Patient limited by fatigue;Patient limited by pain Patient left: with call bell/phone within reach;in chair   PT Visit Diagnosis: Unsteadiness on feet (R26.81);Muscle weakness (generalized) (M62.81);Pain Pain - Right/Left: Left Pain - part of body: Leg;Ankle and joints of foot     Time: 5883-2549 PT Time Calculation (min) (ACUTE ONLY): 17 min  Charges:  $Gait Training: 8-22 mins                    G Codes:       Benjiman Core, PTA Acute Rehab [place] 04/02/2017, 11:47 AM

## 2017-04-02 NOTE — Progress Notes (Signed)
[name] pt for [facility] for [facility] and Pharmacy services.  [facility] Team will follow pt and support transition home when ordered.  If patient discharges after hours, please call [phone].   Larry Sierras 04/02/2017, 10:49 AM

## 2017-04-03 LAB — GLUCOSE, CAPILLARY
GLUCOSE-CAPILLARY: 104 mg/dL — AB (ref 65–99)
GLUCOSE-CAPILLARY: 159 mg/dL — AB (ref 65–99)
Glucose-Capillary: 158 mg/dL — ABNORMAL HIGH (ref 65–99)
Glucose-Capillary: 168 mg/dL — ABNORMAL HIGH (ref 65–99)
Glucose-Capillary: 153 mg/dL — ABNORMAL HIGH (ref 65–99)

## 2017-04-03 MED ORDER — METRONIDAZOLE 500 MG PO TABS
500.0000 mg | ORAL_TABLET | Freq: Three times a day (TID) | ORAL | Status: DC
  Administered 2017-04-03 – 2017-04-05 (×7): 500 mg via ORAL
  Filled 2017-04-03 (×7): qty 1

## 2017-04-03 MED ORDER — DEXTROSE 5 % IV SOLN
2.0000 g | INTRAVENOUS | Status: DC
  Administered 2017-04-03 – 2017-04-05 (×3): 2 g via INTRAVENOUS
  Filled 2017-04-03 (×3): qty 2

## 2017-04-03 NOTE — Care Management Note (Signed)
Case Management Note  Patient Details  Name: [name] MRN: [id number] Date of Birth: August 31, 1961  Subjective/Objective:                    Action/Plan: Patient will get tunneled IJ Catheter placed in Interventional radiology. Will have 4 weeks of IV antibiotics. [facility] will provide [facility] and PT. Patient will have family support at discharge.   Expected Discharge Date:   04/03/17               Expected Discharge Plan:  [place]  In-House Referral:     Discharge planning Services  CM Consult  Post Acute Care Choice:  Durable Medical Equipment, Home Health Choice offered to:  Patient  DME Arranged:  Youth worker wheelchair with seat cushion, Walker rolling, IV pump/equipment DME Agency:  [place]:  PT, RN [facility] Agency:  [place]  Status of Service:  Completed, signed off  If discussed at [place] of Stay Meetings, dates discussed:    Additional Comments:  Ninfa Meeker, RN 04/03/2017, 11:33 AM

## 2017-04-03 NOTE — Progress Notes (Signed)
Per IV team, interventional radiology will place needed IJ.

## 2017-04-03 NOTE — Progress Notes (Signed)
Physical Therapy Treatment Patient Details Name: [name] MRN: [id number] DOB: 04/18/1961 Today's Date: 04/03/2017    History of Present Illness Pt is a 56 y/o that presented with left foot plantar forefoot diabetic ulcer and left foot osteomyelitis.  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  PMH:  Chronic kidney dx, hyponatremia, HTN    PT Comments    Patient reports he is feeling less pain today and was able to ambulate farther today than he could PTA. Pt requires min guard for safety during ambulation as pt can be impulsive and quick to turn. Performed HEP with pt. Patient would benefit from continued skilled PT. Will continue to follow acutely.    Follow Up Recommendations  Home health PT;Supervision/Assistance - 24 hour     Equipment Recommendations  Rolling walker with 5" wheels;Wheelchair (measurements PT);Wheelchair cushion (measurements PT) (18x16 lightweight wc,antitippers, elevating legrests, desk a)    Recommendations for Other Services       Precautions / Restrictions Precautions Precautions: Fall Precaution Comments: JP drain leftLE  Required Braces or Orthoses: Other Brace/Splint Other Brace/Splint: CAM boot left LE with ambulation, AFO for right LE. Restrictions Weight Bearing Restrictions: No [name] Weight Bearing: Weight bearing as tolerated    Mobility  Bed Mobility Overal bed mobility: Needs Assistance;Independent Bed Mobility: Supine to Sit     Supine to sit: Independent        Transfers Overall transfer level: Needs assistance Equipment used: Rolling walker (2 wheeled) Transfers: Sit to/from Stand Sit to Stand: Supervision         General transfer comment: No assist needed. Pt powered up to standing without use of bedrails. Supervision for safety as pt can be impuslive.  Ambulation/Gait Ambulation/Gait assistance: Min guard Ambulation Distance (Feet): 80 Feet Assistive device: Rolling walker (2 wheeled) Gait  Pattern/deviations: Step-to pattern;Decreased step length - left;Decreased stance time - left;Decreased weight shift to left;Decreased dorsiflexion - left;Antalgic     General Gait Details: min guard to ambulate with RW. Frequent cueing for postural control and to place weight evenly through both UE. Cueing to turn slowly.   Stairs            Wheelchair Mobility    Modified Rankin (Stroke Patients Only)       Balance Overall balance assessment: Needs assistance;History of Falls Sitting-balance support: No upper extremity supported;Feet supported Sitting balance-Leahy Scale: Fair     Standing balance support: Bilateral upper extremity supported;During functional activity Standing balance-Leahy Scale: Poor Standing balance comment: relies on UE support for balance                            Cognition Arousal/Alertness: Awake/alert Behavior During Therapy: Impulsive Overall Cognitive Status: Within Functional Limits for tasks assessed Area of Impairment: Safety/judgement;Problem solving                         Safety/Judgement: Decreased awareness of safety            Exercises General Exercises - Lower Extremity Short Arc Quad: AROM;Strengthening;Left;10 reps;Supine Long Arc Quad: AROM;Both;10 reps;Seated Heel Slides: AROM;Both;10 reps;Supine Straight Leg Raises: AROM;Both;10 reps;Supine Other Exercises Other Exercises: gave handout with HEP    General Comments        Pertinent Vitals/Pain Faces Pain Scale: Hurts little more Pain Location: left foot Pain Descriptors / Indicators: Aching;Discomfort;Grimacing;Guarding;Operative site guarding;Sore    Home Living  Prior Function            PT Goals (current goals can now be found in the care plan section) Acute Rehab PT Goals Patient Stated Goal: to go home PT Goal Formulation: With patient Time For Goal Achievement: 04/08/17 Potential to Achieve  Goals: Good    Frequency    Min 5X/week      PT Plan Current plan remains appropriate    Co-evaluation              AM-PAC PT "6 Clicks" Daily Activity  Outcome Measure  Difficulty turning over in bed (including adjusting bedclothes, sheets and blankets)?: None Difficulty moving from lying on back to sitting on the side of the bed? : None Difficulty sitting down on and standing up from a chair with arms (e.g., wheelchair, bedside commode, etc,.)?: A Little Help needed moving to and from a bed to chair (including a wheelchair)?: A Little Help needed walking in hospital room?: A Little Help needed climbing 3-5 steps with a railing? : A Lot 6 Click Score: 19    End of Session Equipment Utilized During Treatment: Gait belt Activity Tolerance: Patient limited by fatigue;Patient limited by pain Patient left: with call bell/phone within reach;in chair Nurse Communication: Mobility status PT Visit Diagnosis: Unsteadiness on feet (R26.81);Muscle weakness (generalized) (M62.81);Pain Pain - Right/Left: Left Pain - part of body: Leg;Ankle and joints of foot     Time: 0315-9458 PT Time Calculation (min) (ACUTE ONLY): 21 min  Charges:  $Gait Training: 8-22 mins                    G Codes:       Benjiman Core, PTA Acute Rehab [place] 04/03/2017, 1:05 PM

## 2017-04-03 NOTE — Progress Notes (Signed)
Sent inquiry to IV team regarding status of tunneled IJ cath placement.  Will keep case management posted.

## 2017-04-03 NOTE — Progress Notes (Signed)
INFECTIOUS DISEASE PROGRESS NOTE  ID: [name] is a 56 y.o. male with  Principal Problem:   Osteomyelitis ([name]) Active Problems:   Anemia of chronic disease   CKD (chronic kidney disease) stage 4, GFR 15-29 ml/min (HCC)   Left foot infection   AKI (acute kidney injury) ([name])   Hyponatremia   Essential hypertension   Diabetic infection of left foot (HCC)  Subjective: Without complaints.   Abtx:  Anti-infectives    Start     Dose/Rate Route Frequency Ordered Stop   04/01/17 2200  vancomycin (VANCOCIN) 1,500 mg in sodium chloride 0.9 % 500 mL IVPB  Status:  Discontinued     1,500 mg 250 mL/hr over 120 Minutes Intravenous Every 48 hours 03/30/17 2239 03/31/17 1124   03/31/17 1400  piperacillin-tazobactam (ZOSYN) IVPB 2.25 g     2.25 g 100 mL/hr over 30 Minutes Intravenous Every 8 hours 03/31/17 1238     03/30/17 2245  piperacillin-tazobactam (ZOSYN) IVPB 3.375 g     3.375 g 100 mL/hr over 30 Minutes Intravenous  Once 03/30/17 2231 03/30/17 2343   03/30/17 2245  vancomycin (VANCOCIN) 2,000 mg in sodium chloride 0.9 % 500 mL IVPB     2,000 mg 250 mL/hr over 120 Minutes Intravenous  Once 03/30/17 2233 03/31/17 0356      Medications:  Scheduled: . amLODipine  10 mg Oral QHS  . atorvastatin  10 mg Oral QHS  . carvedilol  25 mg Oral BID WC  . enoxaparin (LOVENOX) injection  30 mg Subcutaneous Q24H  . ferrous sulfate  325 mg Oral Q breakfast  . gabapentin  300 mg Oral TID  . insulin aspart  0-15 Units Subcutaneous Q4H  . insulin glargine  15 Units Subcutaneous BID  . metroNIDAZOLE  500 mg Oral Q8H  . sodium bicarbonate  650 mg Oral BID    Objective: Vital signs in last 24 hours: Temp:  [99 F (37.2 C)-99.4 F (37.4 C)] 99 F (37.2 C) (05/01 0415) Pulse Rate:  [79-81] 79 (05/01 0415) Resp:  [16] 16 (05/01 0415) BP: (165-170)/(64-74) 165/64 (05/01 0415) SpO2:  [98 %] 98 % (05/01 0415)   General appearance: alert, cooperative and no distress Incision/Wound:  L foot wrapped, no proximal erythema.   Lab Results  Recent Labs  04/01/17 0534 04/02/17 0014  WBC 19.3* 17.7*  HGB 8.0* 7.9*  HCT 25.5* 24.3*  NA 135  --   K 4.2  --   CL 105  --   CO2 17*  --   BUN 85*  --   CREATININE 5.26*  --    Liver Panel No results for input(s): PROT, ALBUMIN, AST, ALT, ALKPHOS, BILITOT, BILIDIR, IBILI in the last 72 hours. Sedimentation Rate  Recent Labs  04/01/17 0534  ESRSEDRATE 114*   C-Reactive Protein  Recent Labs  04/01/17 0534  CRP 15.7*    Microbiology: Recent Results (from the past 240 hour(s))  Blood Culture (routine x 2)     Status: None (Preliminary result)   Collection Time: 03/30/17  8:58 PM  Result Value Ref Range Status   Specimen Description BLOOD RIGHT WRIST  Final   Special Requests   Final    BOTTLES DRAWN AEROBIC AND ANAEROBIC Blood Culture adequate volume 5CC EACH   Culture NO GROWTH 4 DAYS  Final   Report Status PENDING  Incomplete  Blood Culture (routine x 2)     Status: None (Preliminary result)   Collection Time: 03/30/17  9:08 PM  Result Value Ref Range Status   Specimen Description BLOOD RIGHT HAND  Final   Special Requests   Final    IN PEDIATRIC BOTTLE Blood Culture adequate volume 3CC   Culture NO GROWTH 4 DAYS  Final   Report Status PENDING  Incomplete  Wound or Superficial Culture     Status: Abnormal   Collection Time: 03/30/17 10:32 PM  Result Value Ref Range Status   Specimen Description WOUND FOOT  Final   Special Requests NONE  Final   Gram Stain   Final    RARE WBC PRESENT, PREDOMINANTLY PMN MODERATE GRAM POSITIVE COCCI    Culture (A)  Final    MULTIPLE ORGANISMS PRESENT, NONE PREDOMINANT NO STAPHYLOCOCCUS AUREUS ISOLATED NO GROUP A STREP (S.PYOGENES) ISOLATED    Report Status 04/02/2017 FINAL  Final  Surgical pcr screen     Status: None   Collection Time: 03/31/17  1:08 AM  Result Value Ref Range Status   MRSA, PCR NEGATIVE NEGATIVE Final   Staphylococcus aureus NEGATIVE NEGATIVE  Final    Comment:        The Xpert SA Assay (FDA approved for NASAL specimens in patients over 65 years of age), is one component of a comprehensive surveillance program.  Test performance has been validated by [facility] for patients greater than or equal to 78 year old. It is not intended to diagnose infection nor to guide or monitor treatment.     Studies/Results: No results found.   Assessment/Plan: DM foot ulcer with abscess Transmetatarsal amputation  Total days of antibiotics: 3 zosyn  Plan for 4 weeks of IV anbx Cx- polymicrobial Discussed with Dr Wendee Beavers- will discuss with Renal/IR re: home IV ([place]?) Will change his anbx to ceftriaxone/po flagyl.  DIscussed with, appreciate CSW help with home IV anbx HIV (-)                       No Known Allergies  Discharge antibiotics: Ceftriaxone 2g IVPB qday    Flagyl 524m q8h po Duration: 28 days End Date: 05-01-17  [facility] Protocol:  Labs weekly while on IV antibiotics: _x_ CBC with differential __ BMP _x_ CMP __ CRP __ ESR __ Vancomycin trough  _x_ Please pull PIC at completion of IV antibiotics __ Please leave PIC in place until doctor has seen patient or been notified  Fax weekly labs to ([phone] Clinic Follow Up Appt: PCP       JBobby RumpfInfectious Diseases (pager) [phone] [email or web address] 04/03/2017, 11:22 AM  LOS: 4 days

## 2017-04-03 NOTE — Progress Notes (Signed)
Subjective: 3 Days Post-Op Procedure(s) (LRB): Transmetatarsal amputation left foot (Left)  Patient reports pain as mild to moderate.  Tolerating POs well.  Admits to flatulence.  Denies fever, chills, N/V, CP, SOB.  Reports that his ankle pain is improving every day.  Notes that he worked well with therapy yesterday.  Objective:   VITALS:  Temp:  [99 F (37.2 C)-99.4 F (37.4 C)] 99 F (37.2 C) (05/01 0415) Pulse Rate:  [79-81] 79 (05/01 0415) Resp:  [16] 16 (05/01 0415) BP: (165-170)/(64-74) 165/64 (05/01 0415) SpO2:  [98 %] 98 % (05/01 0415)  General: WDWN patient in NAD. Psych:  Appropriate mood and affect. Neuro:  A&O x 3, Moving all extremities, sensation intact to light touch HEENT:  EOMs intact Chest:  Even non-labored respirations Skin:  dressing C/D/I, no rashes or lesions Extremities: warm/dry, no visible edema, erythema, or echymosis.  No lymphadenopathy. Pulses: Poplieus 2+ MSK:  ROM: full ankle ROM, MMT: patient is able to perform quad set, (-) Homan's    LABS  Recent Labs  04/01/17 0534 04/02/17 0014  HGB 8.0* 7.9*  WBC 19.3* 17.7*  PLT 262 268    Recent Labs  04/01/17 0534  NA 135  K 4.2  CL 105  CO2 17*  BUN 85*  CREATININE 5.26*  GLUCOSE 113*   No results for input(s): LABPT, INR in the last 72 hours.   Assessment/Plan: 3 Days Post-Op Procedure(s) (LRB): Transmetatarsal amputation left foot (Left)  WBAT [name] in CAM boot Up with therapy Patient instructed to leave dressing on until outpatient post-op visit Plan for 2 week outpatient post-op visit with Dr. Marchia Bond per ID Patient is stable from an ortho stand point.  Ortho signing off.  Please contact with any questions/concerns.   Mechele Claude, PA-C [facility]opa[facility] Office:  [phone]

## 2017-04-03 NOTE — Progress Notes (Signed)
PROGRESS NOTE    [name]  ZHG:[id number] DOB: September 19, 1961 DOA: 03/30/2017 PCP: Pcp Not In System    Brief Narrative:  Pt is a 56 y/o that presented with left foot plantar forefoot diabetic ulcer and left foot osteomyelitis. Ortho and ID on board. Ortho has signed off. ID recommending prolonged IV antibiotic regimen (4wks).    Assessment & Plan:   Principal Problem:   Osteomyelitis ([place]) - Ortho and ID on board -  Pt is s/p:  1.  Left plantar forefoot diabetic ulcer 2.  Left foot osteomyelitis 3.  Tight left heelcord 4.  Left foot deep abscess Please see discussion under leukocytosis  Active Problems:   Anemia of chronic disease - stable, no active bleeding.  Leukocytosis - ID on board and recommending 4 weeks of IV antibiotics unable to place picc due to renal history (new fistula placed a couple of weeks ago) - Pt's antibiotics switched to Ceftriaxone and po flagyl - Consult placed for IR to evaluate patient for IV access for prolonged antibiotics administration: tunneled IJ cath placed.        Acute on chronic kidney disease stage 4, GFR 15-29 ml/min (HCC) - Continue renally dose antibiotics - Continue gentle fluid rehydration and reassess serum creatinine next a.m.    Hyponatremia - resolved    Essential hypertension - Continue current antihypertensive regimen. Patient may require further titration. - We'll add when necessary hypertensive medication  DVT prophylaxis:Lovenox Code Status: Full Family Communication: Discussed directly with patient Disposition Plan: Once IR has placed IV access may consider d/c   Consultants:   ID and Ortho  Procedures: None  Antimicrobials: Zosyn   Subjective: Pt has no new complaints. No acute issues overnight.  Objective: Vitals:   04/02/17 0501 04/02/17 1900 04/03/17 0415 04/03/17 1345  BP: (!) 164/72 (!) 170/74 (!) 165/64 140/70  Pulse: 74 81 79 73  Resp: 16 16 16 16   Temp: 99.3 F (37.4 C) 99.4 F  (37.4 C) 99 F (37.2 C) 98.5 F (36.9 C)  TempSrc: Oral Oral Oral Oral  SpO2: 96% 98% 98% 99%  Weight:      Height:        Intake/Output Summary (Last 24 hours) at 04/03/17 1538 Last data filed at 04/03/17 1230  Gross per 24 hour  Intake          1282.67 ml  Output              950 ml  Net           332.67 ml   Filed Weights   03/31/17 0007  Weight: 106.6 kg (235 lb 1.6 oz)    Examination:  General exam: Appears calm and comfortable, in nad. Respiratory system: Clear to auscultation. Respiratory effort normal. Cardiovascular system: S1 & S2 heard, RRR. No JVD, murmurs, rubs, gallops or clicks. No pedal edema. Gastrointestinal system: Abdomen is nondistended, soft and nontender. No organomegaly or masses felt. Normal bowel sounds heard. Central nervous system: Alert and oriented. No focal neurological deficits. Extremities: Symmetric 5 x 5 power. Pt is s/p TMA of left foot. Skin: No rashes, warm and dry Psychiatry: Judgement and insight appear normal. Mood & affect appropriate.   Data Reviewed: I have personally reviewed following labs and imaging studies  CBC:  Recent Labs Lab 03/30/17 2058 03/31/17 0004 04/01/17 0534 04/02/17 0014  WBC 18.2* 17.9* 19.3* 17.7*  NEUTROABS 14.5*  --   --   --   HGB 8.7* 8.5* 8.0* 7.9*  HCT 27.1* 25.9* 25.5* 24.3*  MCV 70.0* 70.0* 70.2* 70.2*  PLT 264 272 262 983   Basic Metabolic Panel:  Recent Labs Lab 03/30/17 2058 03/31/17 0004 04/01/17 0534  NA 132* 134* 135  K 4.3 4.6 4.2  CL 101 103 105  CO2 16* 17* 17*  GLUCOSE 159* 157* 113*  BUN 91* 90* 85*  CREATININE 5.34* 5.30* 5.26*  CALCIUM 8.5* 8.3* 7.9*   GFR: Estimated Creatinine Clearance: 20.5 mL/min (A) (by C-G formula based on SCr of 5.26 mg/dL (H)). Liver Function Tests:  Recent Labs Lab 03/30/17 2058  AST 21  ALT 22  ALKPHOS 68  BILITOT 0.5  PROT 7.2  ALBUMIN 2.6*   No results for input(s): LIPASE, AMYLASE in the last 168 hours. No results for  input(s): AMMONIA in the last 168 hours. Coagulation Profile:  Recent Labs Lab 03/31/17 0004  INR 1.17   Cardiac Enzymes: No results for input(s): CKTOTAL, CKMB, CKMBINDEX, TROPONINI in the last 168 hours. BNP (last 3 results) No results for input(s): PROBNP in the last 8760 hours. HbA1C: No results for input(s): HGBA1C in the last 72 hours. CBG:  Recent Labs Lab 04/02/17 2037 04/03/17 0015 04/03/17 0430 04/03/17 0748 04/03/17 1155  GLUCAP 164* 158* 104* 159* 168*   Lipid Profile: No results for input(s): CHOL, HDL, LDLCALC, TRIG, CHOLHDL, LDLDIRECT in the last 72 hours. Thyroid Function Tests: No results for input(s): TSH, T4TOTAL, FREET4, T3FREE, THYROIDAB in the last 72 hours. Anemia Panel: No results for input(s): VITAMINB12, FOLATE, FERRITIN, TIBC, IRON, RETICCTPCT in the last 72 hours. Sepsis Labs:  Recent Labs Lab 03/30/17 2116  LATICACIDVEN 0.66    Recent Results (from the past 240 hour(s))  Blood Culture (routine x 2)     Status: None (Preliminary result)   Collection Time: 03/30/17  8:58 PM  Result Value Ref Range Status   Specimen Description BLOOD RIGHT WRIST  Final   Special Requests   Final    BOTTLES DRAWN AEROBIC AND ANAEROBIC Blood Culture adequate volume 5CC EACH   Culture NO GROWTH 4 DAYS  Final   Report Status PENDING  Incomplete  Blood Culture (routine x 2)     Status: None (Preliminary result)   Collection Time: 03/30/17  9:08 PM  Result Value Ref Range Status   Specimen Description BLOOD RIGHT HAND  Final   Special Requests   Final    IN PEDIATRIC BOTTLE Blood Culture adequate volume 3CC   Culture NO GROWTH 4 DAYS  Final   Report Status PENDING  Incomplete  Wound or Superficial Culture     Status: Abnormal   Collection Time: 03/30/17 10:32 PM  Result Value Ref Range Status   Specimen Description WOUND FOOT  Final   Special Requests NONE  Final   Gram Stain   Final    RARE WBC PRESENT, PREDOMINANTLY PMN MODERATE GRAM POSITIVE  COCCI    Culture (A)  Final    MULTIPLE ORGANISMS PRESENT, NONE PREDOMINANT NO STAPHYLOCOCCUS AUREUS ISOLATED NO GROUP A STREP (S.PYOGENES) ISOLATED    Report Status 04/02/2017 FINAL  Final  Surgical pcr screen     Status: None   Collection Time: 03/31/17  1:08 AM  Result Value Ref Range Status   MRSA, PCR NEGATIVE NEGATIVE Final   Staphylococcus aureus NEGATIVE NEGATIVE Final    Comment:        The Xpert SA Assay (FDA approved for NASAL specimens in patients over 66 years of age), is one component of a comprehensive  surveillance program.  Test performance has been validated by [facility] for patients greater than or equal to 48 year old. It is not intended to diagnose infection nor to guide or monitor treatment.          Radiology Studies: No results found.  Scheduled Meds: . amLODipine  10 mg Oral QHS  . atorvastatin  10 mg Oral QHS  . carvedilol  25 mg Oral BID WC  . enoxaparin (LOVENOX) injection  30 mg Subcutaneous Q24H  . ferrous sulfate  325 mg Oral Q breakfast  . gabapentin  300 mg Oral TID  . insulin aspart  0-15 Units Subcutaneous Q4H  . insulin glargine  15 Units Subcutaneous BID  . metroNIDAZOLE  500 mg Oral Q8H  . sodium bicarbonate  650 mg Oral BID   Continuous Infusions: . sodium chloride 10 mL/hr at 04/02/17 1211  . cefTRIAXone (ROCEPHIN)  IV 2 g (04/03/17 1316)     LOS: 4 days    Time spent: > 35 minutes  Velvet Bathe, MD [facility] Pager [phone]  If 7PM-7AM, please contact night-coverage [email or web address] Password [facility] 04/03/2017, 3:38 PM

## 2017-04-04 ENCOUNTER — Encounter (HOSPITAL_COMMUNITY): Payer: Self-pay | Admitting: Interventional Radiology

## 2017-04-04 ENCOUNTER — Inpatient Hospital Stay (HOSPITAL_COMMUNITY): Payer: Medicare Other

## 2017-04-04 DIAGNOSIS — D631 Anemia in chronic kidney disease: Secondary | ICD-10-CM

## 2017-04-04 DIAGNOSIS — N185 Chronic kidney disease, stage 5: Secondary | ICD-10-CM

## 2017-04-04 HISTORY — PX: IR US GUIDE VASC ACCESS RIGHT: IMG2390

## 2017-04-04 HISTORY — PX: IR FLUORO GUIDE CV LINE RIGHT: IMG2283

## 2017-04-04 LAB — CULTURE, BLOOD (ROUTINE X 2)
Culture: NO GROWTH
Culture: NO GROWTH
Special Requests: ADEQUATE

## 2017-04-04 LAB — BASIC METABOLIC PANEL
Anion gap: 13 (ref 5–15)
BUN: 74 mg/dL — ABNORMAL HIGH (ref 6–20)
CO2: 17 mmol/L — AB (ref 22–32)
Calcium: 7.8 mg/dL — ABNORMAL LOW (ref 8.9–10.3)
Chloride: 103 mmol/L (ref 101–111)
Creatinine, Ser: 5.39 mg/dL — ABNORMAL HIGH (ref 0.61–1.24)
GFR calc Af Amer: 12 mL/min — ABNORMAL LOW (ref >60)
GFR, EST NON AFRICAN AMERICAN: 11 mL/min — AB (ref >60)
GLUCOSE: 133 mg/dL — AB (ref 65–99)
POTASSIUM: 4.1 mmol/L (ref 3.5–5.1)
Sodium: 133 mmol/L — ABNORMAL LOW (ref 135–145)

## 2017-04-04 LAB — RETICULOCYTES
RBC.: 3.26 MIL/uL — ABNORMAL LOW (ref 4.22–5.81)
Retic Count, Absolute: 29.3 10*3/uL (ref 19.0–186.0)
Retic Ct Pct: 0.9 % (ref 0.4–3.1)

## 2017-04-04 LAB — GLUCOSE, CAPILLARY
GLUCOSE-CAPILLARY: 102 mg/dL — AB (ref 65–99)
GLUCOSE-CAPILLARY: 118 mg/dL — AB (ref 65–99)
GLUCOSE-CAPILLARY: 125 mg/dL — AB (ref 65–99)
GLUCOSE-CAPILLARY: 189 mg/dL — AB (ref 65–99)
GLUCOSE-CAPILLARY: 94 mg/dL (ref 65–99)
GLUCOSE-CAPILLARY: 95 mg/dL (ref 65–99)

## 2017-04-04 LAB — CBC
HCT: 23.5 % — ABNORMAL LOW (ref 39.0–52.0)
Hemoglobin: 7.4 g/dL — ABNORMAL LOW (ref 13.0–17.0)
MCH: 22.1 pg — ABNORMAL LOW (ref 26.0–34.0)
MCHC: 31.5 g/dL (ref 30.0–36.0)
MCV: 70.1 fL — AB (ref 78.0–100.0)
PLATELETS: 312 10*3/uL (ref 150–400)
RBC: 3.35 MIL/uL — AB (ref 4.22–5.81)
RDW: 14.1 % (ref 11.5–15.5)
WBC: 19.9 10*3/uL — AB (ref 4.0–10.5)

## 2017-04-04 LAB — IRON AND TIBC
IRON: 33 ug/dL — AB (ref 45–182)
Saturation Ratios: 23 % (ref 17.9–39.5)
TIBC: 146 ug/dL — AB (ref 250–450)
UIBC: 113 ug/dL

## 2017-04-04 LAB — FOLATE: FOLATE: 6.1 ng/mL (ref >5.9)

## 2017-04-04 LAB — VITAMIN B12: VITAMIN B 12: 501 pg/mL (ref 180–914)

## 2017-04-04 LAB — FERRITIN: Ferritin: 877 ng/mL — ABNORMAL HIGH (ref 24–336)

## 2017-04-04 MED ORDER — LIDOCAINE HCL 1 % IJ SOLN
INTRAMUSCULAR | Status: DC | PRN
  Administered 2017-04-04: 10 mL

## 2017-04-04 MED ORDER — DARBEPOETIN ALFA 100 MCG/0.5ML IJ SOSY
100.0000 ug | PREFILLED_SYRINGE | Freq: Once | INTRAMUSCULAR | Status: AC
  Administered 2017-04-04: 100 ug via SUBCUTANEOUS
  Filled 2017-04-04: qty 0.5

## 2017-04-04 MED ORDER — LIDOCAINE HCL 1 % IJ SOLN
INTRAMUSCULAR | Status: AC
  Filled 2017-04-04: qty 20

## 2017-04-04 NOTE — Procedures (Signed)
Infection, long term abx access  s/p RT IJ TUNNELED PICC  No comp Stable Tip svcra Ready for use Full report in PACS

## 2017-04-04 NOTE — Progress Notes (Signed)
Physical Therapy Treatment Patient Details Name: [name] MRN: [id number] DOB: 01-04-1961 Today's Date: 04/04/2017    History of Present Illness Pt is a 56 y/o that presented with left foot plantar forefoot diabetic ulcer and left foot osteomyelitis.  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  PMH:  Chronic kidney dx, hyponatremia, HTN    PT Comments    Patient reported he is feeling weak today as he is not able to eat or drink in preporation for surgery today to insert IJ catheter. Ambulated 80 feet with RW and min guard for safety. Pt can be impulsive and attempted to step out of walker during ambulation. Mod Cueing required for safe technique during stand>sit with rolling walker. Pt should benefit from continued PT to increase safety and independence. Will continue to follow acutely.   Follow Up Recommendations  Home health PT;Supervision/Assistance - 24 hour     Equipment Recommendations  Rolling walker with 5" wheels;Wheelchair (measurements PT);Wheelchair cushion (measurements PT) (18x16 lightweight wc,antitippers, elevating legrests, desk a)    Recommendations for Other Services       Precautions / Restrictions Precautions Precautions: Fall Precaution Comments: JP drain leftLE  Required Braces or Orthoses: Other Brace/Splint Other Brace/Splint: CAM boot left LE with ambulation, AFO for right LE. Restrictions Weight Bearing Restrictions: No [name] Weight Bearing: Weight bearing as tolerated    Mobility  Bed Mobility Overal bed mobility: Independent Bed Mobility: Supine to Sit     Supine to sit: Independent        Transfers Overall transfer level: Needs assistance Equipment used: Rolling walker (2 wheeled) Transfers: Sit to/from Stand Sit to Stand: Supervision         General transfer comment: No assist needed. Pt powered up to standing without use of bedrails. Supervision for safety as pt can be impuslive. Cueing for correct technique when  transitioning from stand to sit with walker.  Ambulation/Gait Ambulation/Gait assistance: Min guard Ambulation Distance (Feet): 80 Feet Assistive device: Rolling walker (2 wheeled) Gait Pattern/deviations: Step-to pattern;Decreased step length - left;Decreased stance time - left;Decreased weight shift to left;Decreased dorsiflexion - left;Antalgic     General Gait Details: min guard to ambulate with RW. Frequent cueing for postural control and to place weight evenly through both UE.   Stairs            Wheelchair Mobility    Modified Rankin (Stroke Patients Only)       Balance Overall balance assessment: Needs assistance;History of Falls Sitting-balance support: No upper extremity supported;Feet supported Sitting balance-Leahy Scale: Good     Standing balance support: Bilateral upper extremity supported;During functional activity Standing balance-Leahy Scale: Poor Standing balance comment: relies on UE support for balance                            Cognition Arousal/Alertness: Awake/alert Behavior During Therapy: Impulsive Overall Cognitive Status: Within Functional Limits for tasks assessed Area of Impairment: Safety/judgement;Problem solving                         Safety/Judgement: Decreased awareness of safety   Problem Solving: Difficulty sequencing;Requires verbal cues        Exercises      General Comments        Pertinent Vitals/Pain Faces Pain Scale: Hurts a little bit Pain Location: left foot Pain Descriptors / Indicators: Aching;Discomfort;Grimacing;Guarding;Operative site guarding;Sore    Home Living  Prior Function            PT Goals (current goals can now be found in the care plan section) Acute Rehab PT Goals Patient Stated Goal: to go home PT Goal Formulation: With patient Time For Goal Achievement: 04/08/17 Potential to Achieve Goals: Good Progress towards PT goals:  Progressing toward goals    Frequency    Min 5X/week      PT Plan Current plan remains appropriate    Co-evaluation              AM-PAC PT "6 Clicks" Daily Activity  Outcome Measure  Difficulty turning over in bed (including adjusting bedclothes, sheets and blankets)?: None Difficulty moving from lying on back to sitting on the side of the bed? : None Difficulty sitting down on and standing up from a chair with arms (e.g., wheelchair, bedside commode, etc,.)?: A Little Help needed moving to and from a bed to chair (including a wheelchair)?: A Little Help needed walking in hospital room?: A Little Help needed climbing 3-5 steps with a railing? : A Lot 6 Click Score: 19    End of Session Equipment Utilized During Treatment: Gait belt Activity Tolerance: Patient limited by fatigue;Patient limited by pain Patient left: with call bell/phone within reach;in chair Nurse Communication: Mobility status PT Visit Diagnosis: Unsteadiness on feet (R26.81);Muscle weakness (generalized) (M62.81);Pain Pain - Right/Left: Left Pain - part of body: Leg;Ankle and joints of foot     Time: 1022-1039 PT Time Calculation (min) (ACUTE ONLY): 17 min  Charges:  $Gait Training: 8-22 mins                    G Codes:       Benjiman Core, PTA Acute Rehab [place] 04/04/2017, 10:47 AM

## 2017-04-04 NOTE — Progress Notes (Signed)
PHARMACY CONSULT NOTE FOR:  OUTPATIENT  PARENTERAL ANTIBIOTIC THERAPY (OPAT)  Indication: Osteo / deep abscess of left foot Regimen: Ceftriaxone 2g IV Q24h, also will need to add Flagyl 500mg  PO Q8h in discharge meds End date: 05/01/2017  IV antibiotic discharge orders are pended. To discharging provider:  please sign these orders via discharge navigator,  Select New Orders & click on the button choice - Manage This Unsigned Work.    Thank you for allowing pharmacy to be a part of this patient's care.  Elenor Quinones, PharmD, BCPS Clinical Pharmacist Pager [phone] 04/04/2017 7:37 AM

## 2017-04-04 NOTE — Progress Notes (Signed)
PROGRESS NOTE        PATIENT DETAILS Name: [name] Age: 56 y.o. Sex: male Date of Birth: July 15, 1961 Admit Date: 03/30/2017 Admitting Physician [name], MD PCP:Pcp Not In System  Brief Narrative: Patient is a 56 y.o. male with history of diabetes, hypertension, chronic kidney disease stage V-admitted on 4/27 with left diabetic foot, evaluated by orthopedics-underwent left transmetatarsal amputation. Subsequently seen by infectious disease-with plans to complete IV antimicrobial therapy as outpatient. See below for further details  Subjective: Lying comfortably in bed  No chest pain No SOB No headache No Nausea,vomiting or diarrhea  Assessment/Plan: Left diabetic foot with left foot deep abscess and osteomyelitis: Evaluated by orthopedics-underwent left transmetatarsal and dictation on 4/28, remains on empiric Rocephin and Flagyl. ID following-antibiotic end date 05/01/17. Continues to leukocytosis, but overall clinically improved-suspect that leukocytosis was more due to ongoing inflammation rather than new infection.  Anemia of chronic kidney disease/inflammation: Hemoglobin slowly decreasing-no overt blood loss, suspect secondary to anemia secondary to chronic disease worsened by acute illness. Check anemia panel-if consistent with anemia of chronic kidney disease-suspect may require iron and darbepoetin before discharge. Recheck CBC in a.m.  Hypertension: Controlled-continue with amlodipine  Insulin-dependent type 2 diabetes: CBGs stable with Lantus 15 units twice a day and SSI  Dyslipidemia: Continue statin  Hyponatremia: Very mild-doubt any further utility for workup.  DVT Prophylaxis: Prophylactic Lovenox   Code Status: Full code   Family Communication: None at bedside  Disposition Plan: Remain inpatient-home likely in am  Antimicrobial agents: Anti-infectives    Start     Dose/Rate Route Frequency Ordered Stop   04/03/17  1400  metroNIDAZOLE (FLAGYL) tablet 500 mg     500 mg Oral Every 8 hours 04/03/17 1128 05/01/17 1359   04/03/17 1200  cefTRIAXone (ROCEPHIN) 2 g in dextrose 5 % 50 mL IVPB     2 g 100 mL/hr over 30 Minutes Intravenous Every 24 hours 04/03/17 1128 05/01/17 1159   04/01/17 2200  vancomycin (VANCOCIN) 1,500 mg in sodium chloride 0.9 % 500 mL IVPB  Status:  Discontinued     1,500 mg 250 mL/hr over 120 Minutes Intravenous Every 48 hours 03/30/17 2239 03/31/17 1124   03/31/17 1400  piperacillin-tazobactam (ZOSYN) IVPB 2.25 g  Status:  Discontinued     2.25 g 100 mL/hr over 30 Minutes Intravenous Every 8 hours 03/31/17 1238 04/03/17 1128   03/30/17 2245  piperacillin-tazobactam (ZOSYN) IVPB 3.375 g     3.375 g 100 mL/hr over 30 Minutes Intravenous  Once 03/30/17 2231 03/30/17 2343   03/30/17 2245  vancomycin (VANCOCIN) 2,000 mg in sodium chloride 0.9 % 500 mL IVPB     2,000 mg 250 mL/hr over 120 Minutes Intravenous  Once 03/30/17 2233 03/31/17 0356      Procedures: None  CONSULTS:  ID and orthopedic surgery  Time spent: 25- minutes-Greater than 50% of this time was spent in counseling, explanation of diagnosis, planning of further management, and coordination of care.  MEDICATIONS: Scheduled Meds: . amLODipine  10 mg Oral QHS  . atorvastatin  10 mg Oral QHS  . carvedilol  25 mg Oral BID WC  . enoxaparin (LOVENOX) injection  30 mg Subcutaneous Q24H  . ferrous sulfate  325 mg Oral Q breakfast  . gabapentin  300 mg Oral TID  . insulin aspart  0-15 Units Subcutaneous Q4H  .  insulin glargine  15 Units Subcutaneous BID  . metroNIDAZOLE  500 mg Oral Q8H  . sodium bicarbonate  650 mg Oral BID   Continuous Infusions: . sodium chloride 10 mL/hr at 04/02/17 1211  . cefTRIAXone (ROCEPHIN)  IV 2 g (04/04/17 1247)   PRN Meds:.acetaminophen **OR** acetaminophen, bisacodyl, hydrALAZINE, polyethylene glycol, traMADol   PHYSICAL EXAM: Vital signs: Vitals:   04/03/17 2100 04/04/17 0016  04/04/17 0500 04/04/17 1327  BP: (!) 161/77 (!) 151/69 (!) 158/84 (!) 141/63  Pulse: 76 70 74 63  Resp: 18 18 18 18   Temp: 98.5 F (36.9 C) 99.3 F (37.4 C) 99 F (37.2 C) 98 F (36.7 C)  TempSrc: Oral Oral Oral Oral  SpO2: 100% 98% 100% 98%  Weight:      Height:       Filed Weights   03/31/17 0007  Weight: 106.6 kg (235 lb 1.6 oz)   Body mass index is 28.62 kg/m.   General appearance :Awake, alert, not in any distress. Speech Clear. Eyes:, pupils equally reactive to light and accomodation,no scleral icterus HEENT: Atraumatic and Normocephalic Neck: supple, no JVD. No cervical lymphadenopathy Resp:Good air entry bilaterally, no added sounds  CVS: S1 S2 regular, no murmurs.  GI: Bowel sounds present, Non tender and not distended with no gaurding, rigidity or rebound. Extremities: B/L Lower Ext shows no edema,Left lower extremity in postoperative boot Neurology:  speech clear,Non focal, sensation is grossly intact. Psychiatric: Normal judgment and insight.  Musculoskeletal:No digital cyanosis Skin:No Rash, warm and dry Wounds:N/A  I have personally reviewed following labs and imaging studies  LABORATORY DATA: CBC:  Recent Labs Lab 03/30/17 2058 03/31/17 0004 04/01/17 0534 04/02/17 0014 04/04/17 0738  WBC 18.2* 17.9* 19.3* 17.7* 19.9*  NEUTROABS 14.5*  --   --   --   --   HGB 8.7* 8.5* 8.0* 7.9* 7.4*  HCT 27.1* 25.9* 25.5* 24.3* 23.5*  MCV 70.0* 70.0* 70.2* 70.2* 70.1*  PLT 264 272 262 268 614    Basic Metabolic Panel:  Recent Labs Lab 03/30/17 2058 03/31/17 0004 04/01/17 0534 04/04/17 0536  NA 132* 134* 135 133*  K 4.3 4.6 4.2 4.1  CL 101 103 105 103  CO2 16* 17* 17* 17*  GLUCOSE 159* 157* 113* 133*  BUN 91* 90* 85* 74*  CREATININE 5.34* 5.30* 5.26* 5.39*  CALCIUM 8.5* 8.3* 7.9* 7.8*    GFR: Estimated Creatinine Clearance: 20 mL/min (A) (by C-G formula based on SCr of 5.39 mg/dL (H)).  Liver Function Tests:  Recent Labs Lab 03/30/17 2058    AST 21  ALT 22  ALKPHOS 68  BILITOT 0.5  PROT 7.2  ALBUMIN 2.6*   No results for input(s): LIPASE, AMYLASE in the last 168 hours. No results for input(s): AMMONIA in the last 168 hours.  Coagulation Profile:  Recent Labs Lab 03/31/17 0004  INR 1.17    Cardiac Enzymes: No results for input(s): CKTOTAL, CKMB, CKMBINDEX, TROPONINI in the last 168 hours.  BNP (last 3 results) No results for input(s): PROBNP in the last 8760 hours.  HbA1C: No results for input(s): HGBA1C in the last 72 hours.  CBG:  Recent Labs Lab 04/03/17 1631 04/03/17 2112 04/04/17 0023 04/04/17 0442 04/04/17 1125  GLUCAP 153* 125* 118* 95 94    Lipid Profile: No results for input(s): CHOL, HDL, LDLCALC, TRIG, CHOLHDL, LDLDIRECT in the last 72 hours.  Thyroid Function Tests: No results for input(s): TSH, T4TOTAL, FREET4, T3FREE, THYROIDAB in the last 72 hours.  Anemia Panel:  Recent Labs  04/04/17 0946  FOLATE 6.1  RETICCTPCT 0.9    Urine analysis:    Component Value Date/Time   COLORURINE YELLOW 03/30/2017 2140   APPEARANCEUR HAZY (A) 03/30/2017 2140   LABSPEC 1.011 03/30/2017 2140   PHURINE 5.0 03/30/2017 2140   GLUCOSEU 150 (A) 03/30/2017 2140   HGBUR MODERATE (A) 03/30/2017 2140   BILIRUBINUR NEGATIVE 03/30/2017 2140   KETONESUR NEGATIVE 03/30/2017 2140   PROTEINUR >=300 (A) 03/30/2017 2140   NITRITE NEGATIVE 03/30/2017 2140   LEUKOCYTESUR NEGATIVE 03/30/2017 2140    Sepsis Labs: Lactic Acid, Venous    Component Value Date/Time   LATICACIDVEN 0.66 03/30/2017 2116    MICROBIOLOGY: Recent Results (from the past 240 hour(s))  Blood Culture (routine x 2)     Status: None   Collection Time: 03/30/17  8:58 PM  Result Value Ref Range Status   Specimen Description BLOOD RIGHT WRIST  Final   Special Requests   Final    BOTTLES DRAWN AEROBIC AND ANAEROBIC Blood Culture adequate volume 5CC EACH   Culture NO GROWTH 5 DAYS  Final   Report Status 04/04/2017 FINAL  Final   Blood Culture (routine x 2)     Status: None   Collection Time: 03/30/17  9:08 PM  Result Value Ref Range Status   Specimen Description BLOOD RIGHT HAND  Final   Special Requests   Final    IN PEDIATRIC BOTTLE Blood Culture adequate volume 3CC   Culture NO GROWTH 5 DAYS  Final   Report Status 04/04/2017 FINAL  Final  Wound or Superficial Culture     Status: Abnormal   Collection Time: 03/30/17 10:32 PM  Result Value Ref Range Status   Specimen Description WOUND FOOT  Final   Special Requests NONE  Final   Gram Stain   Final    RARE WBC PRESENT, PREDOMINANTLY PMN MODERATE GRAM POSITIVE COCCI    Culture (A)  Final    MULTIPLE ORGANISMS PRESENT, NONE PREDOMINANT NO STAPHYLOCOCCUS AUREUS ISOLATED NO GROUP A STREP (S.PYOGENES) ISOLATED    Report Status 04/02/2017 FINAL  Final  Surgical pcr screen     Status: None   Collection Time: 03/31/17  1:08 AM  Result Value Ref Range Status   MRSA, PCR NEGATIVE NEGATIVE Final   Staphylococcus aureus NEGATIVE NEGATIVE Final    Comment:        The Xpert SA Assay (FDA approved for NASAL specimens in patients over 15 years of age), is one component of a comprehensive surveillance program.  Test performance has been validated by [facility] for patients greater than or equal to 35 year old. It is not intended to diagnose infection nor to guide or monitor treatment.     RADIOLOGY STUDIES/RESULTS: Dg Chest Port 1 View  Result Date: 03/30/2017 CLINICAL DATA:  Preop chest x-ray for foot ulcer history of hypertension and diabetes EXAM: PORTABLE CHEST 1 VIEW COMPARISON:  08/20/2013 FINDINGS: The heart size and mediastinal contours are within normal limits. Both lungs are clear. The visualized skeletal structures are unremarkable. IMPRESSION: No active disease. Electronically Signed   By: Donavan Foil M.D.   On: 03/30/2017 23:55   Dg Foot Complete Left  Result Date: 03/30/2017 CLINICAL DATA:  Wound  lower on bottom of left foot EXAM: LEFT  FOOT - COMPLETE 3+ VIEW COMPARISON:  MRI 01/22/2017, radiographs 01/20/2017 FINDINGS: Patient is status post amputation of the second digit at the level of the MTP joint. Scattered pockets of soft tissue gas along the  plantar surface of the second through fourth digits. Chronic deformity of the distal fourth metatarsal. Partial amputation of the distal fourth digit as before. Possible erosive change at the base of the third proximal phalanx. Vascular calcifications IMPRESSION: 1. Possible erosive change at the base of the third proximal phalanx, with possible articular extension to the third MTP joint raises possibility for osteomyelitis/joint infection. 2. Soft tissue gas along the plantar surface of the second through fourth digits distally Electronically Signed   By: Donavan Foil M.D.   On: 03/30/2017 21:52     LOS: 5 days   Oren Binet, MD  [facility] Pager:[phone]  If 7PM-7AM, please contact night-coverage [email or web address] Password [email or web address] 04/04/2017, 1:48 PM

## 2017-04-05 DIAGNOSIS — E11628 Type 2 diabetes mellitus with other skin complications: Secondary | ICD-10-CM

## 2017-04-05 DIAGNOSIS — L089 Local infection of the skin and subcutaneous tissue, unspecified: Secondary | ICD-10-CM

## 2017-04-05 LAB — GLUCOSE, CAPILLARY
GLUCOSE-CAPILLARY: 144 mg/dL — AB (ref 65–99)
Glucose-Capillary: 105 mg/dL — ABNORMAL HIGH (ref 65–99)
Glucose-Capillary: 114 mg/dL — ABNORMAL HIGH (ref 65–99)
Glucose-Capillary: 138 mg/dL — ABNORMAL HIGH (ref 65–99)

## 2017-04-05 MED ORDER — METRONIDAZOLE 500 MG PO TABS: 500.0000 mg | ORAL_TABLET | Freq: Three times a day (TID) | ORAL | 0 refills | Status: DC

## 2017-04-05 MED ORDER — TRAMADOL HCL 50 MG PO TABS: 50.0000 mg | ORAL_TABLET | Freq: Four times a day (QID) | ORAL | 0 refills | Status: DC | PRN

## 2017-04-05 MED ORDER — CEFTRIAXONE IV (FOR PTA / DISCHARGE USE ONLY)
2.0000 g | INTRAVENOUS | 0 refills | Status: AC
Start: 2017-04-05 — End: 2017-05-02

## 2017-04-05 MED ORDER — HEPARIN SOD (PORK) LOCK FLUSH 100 UNIT/ML IV SOLN
250.0000 [IU] | INTRAVENOUS | Status: AC | PRN
  Administered 2017-04-05: 250 [IU]

## 2017-04-05 NOTE — Discharge Summary (Signed)
PATIENT DETAILS Name: [name] Age: 56 y.o. Sex: male Date of Birth: 18-May-1961 MRN: [id number]. Admitting Physician: [name], MD PCP:Pcp Not In System  Admit Date: 03/30/2017 Discharge date: 04/05/2017  Recommendations for Outpatient Follow-up:  1. Follow up with PCP in 1-2 weeks 2. Please obtain CMP/CBC weekly while on rocephin-please Fax weekly labs to [phone] 3. Please ensure follow up with orthopedics  Admitted From:  Home   Disposition: Home with home health services   Home Health: Yes  Equipment/Devices: None  Discharge Condition: Stable  CODE STATUS: FULL CODE  Diet recommendation:  Heart Healthy / Carb Modified   Brief Summary: See H&P, Labs, Consult and Test reports for all details in brief,Patient is a 56 y.o. male with history of diabetes, hypertension, chronic kidney disease stage V-admitted on 4/27 with left diabetic foot, evaluated by orthopedics-underwent left transmetatarsal amputation. Subsequently seen by infectious disease-with plans to complete IV antimicrobial therapy as outpatient. See below for further details  Brief Hospital Course: Left diabetic foot with left foot deep abscess and osteomyelitis: Evaluated by orthopedics-underwent left transmetatarsal and dictation on 4/28, remains on empiric Rocephin and Flagyl. ID consulted with recommendations to continue IV Rocephin and oral Flagyl on discharge with end date 05/01/17. Continues to leukocytosis, but overall clinically improved-suspect that leukocytosis was more due to ongoing inflammation rather than new infection.Please obtain CMP/CBC weekly while on rocephin-please Fax weekly labs to [phone] (have ordered with Home Health services order)  Anemia of chronic kidney disease/inflammation: Hemoglobin slowly decreasing-no overt blood loss, suspect secondary to anemia secondary to chronic disease worsened by acute illness.Anemia panel consistent with anemia of chronic  disease. Spoke with Renal MD-Dr Sanford-and subsequent given Aranesp X 1 dose. No overt bleeding-denies hematochezia/melena. Have advised patient get a repeat CBC next week at nephrologist's office or with PCP. Note patient asymptomatic with anemia-as he denies SOB or chest pain when he ambulates.   Hypertension: Controlled-continue with amlodipine  Insulin-dependent type 2 diabetes: CBGs stable-resume usual insulin regimen on discharege  Dyslipidemia: Continue statin  Hyponatremia: Very mild-doubt any further utility for workup.  Procedures/Studies: left transmetatarsal and dictation on 4/28  Discharge Diagnoses:  Principal Problem:   Osteomyelitis ([place]) Active Problems:   Anemia of chronic disease   CKD (chronic kidney disease) stage 4, GFR 15-29 ml/min (HCC)   Left foot infection   AKI (acute kidney injury) ([place])   Hyponatremia   Essential hypertension   Diabetic infection of left foot [facility])   Discharge Instructions:  Activity:  As tolerated with Full fall precautions use walker/cane & assistance as needed  Discharge Instructions    Call MD for:  redness, tenderness, or signs of infection (pain, swelling, redness, odor or green/yellow discharge around incision site)    Complete by:  As directed    Diet - low sodium heart healthy    Complete by:  As directed    Diet Carb Modified    Complete by:  As directed    Home infusion instructions [facility] May follow [place] Dosing Protocol; May administer Cathflo as needed to maintain patency of vascular access device.; Flushing of vascular access device: per [facility] Protocol: 0.9% NaCl pre/post medica...    Complete by:  As directed    Instructions:  May follow [place] Dosing Protocol   Instructions:  May administer Cathflo as needed to maintain patency of vascular access device.   Instructions:  Flushing of vascular access device: per [facility] Protocol: 0.9% NaCl pre/post medication  administration and prn patency;  Heparin 100 u/ml, 20m for implanted ports and Heparin 10u/ml, 56mfor all other central venous catheters.   Instructions:  May follow [facility] Anaphylaxis Protocol for First Dose Administration in the home: 0.9% NaCl at 25-50 ml/hr to maintain IV access for protocol meds. Epinephrine 0.3 ml IV/IM PRN and Benadryl 25-50 IV/IM PRN s/s of anaphylaxis.   Instructions:  [facility]fusion Coordinator (RN) to assist per patient IV care needs in the home PRN.   Increase activity slowly    Complete by:  As directed    WBAT [name] in CAM boot     Allergies as of 04/05/2017   No Known Allergies     Medication List    STOP taking these medications   oxyCODONE-acetaminophen 5-325 MG tablet Commonly known as:  PERCOCET/ROXICET     TAKE these medications   atorvastatin 10 MG tablet Commonly known as:  LIPITOR Take 10 mg by mouth at bedtime.   carvedilol 25 MG tablet Commonly known as:  COREG Take 25 mg by mouth 2 (two) times daily with a meal.   cefTRIAXone IVPB Commonly known as:  ROCEPHIN Inject 2 g into the vein daily. Indication: Foot ulcer with abscess Last Day of Therapy:  05/01/2017 Labs - Once weekly:  CBC/D and BMP, Labs - Every other week:  ESR and CRP   gabapentin 300 MG capsule Commonly known as:  NEURONTIN Take 300 mg by mouth 3 (three) times daily.   HUMULIN 70/30 [name] Inject 30 Units into the skin 2 (two) times daily.   Iron 325 (65 Fe) MG Tabs Take 1 tablet by mouth daily.   metroNIDAZOLE 500 MG tablet Commonly known as:  FLAGYL Take 1 tablet (500 mg total) by mouth every 8 (eight) hours.   NORVASC 10 MG tablet Generic drug:  amLODipine Take 10 mg by mouth at bedtime.   sodium bicarbonate 650 MG tablet Take 650 mg by mouth 2 (two) times daily.   traMADol 50 MG tablet Commonly known as:  ULTRAM Take 1 tablet (50 mg total) by mouth every 6 (six) hours as needed for moderate pain.            Home Infusion Instuctions        Start     Ordered   04/05/17  0000  Home infusion instructions [facility] follow [place] Protocol; May administer Cathflo as needed to maintain patency of vascular access device.; Flushing of vascular access [facility]: 0.9% NaCl pre/post medica...    Question Answer Comment  Instructions May follow [facility] Protocol   Instructions May administer Cathflo as needed to maintain patency of vascular access device.   Instructions Flushing of vascular access device: [facility]: 0.9% NaCl pre/post medication administration and prn patency; Heparin 100 u/ml, 72m57mor implanted ports and Heparin 10u/ml, 72ml39mr all other central venous catheters.   Instructions May follow [facility] Anaphylaxis Protocol for First Dose Administration in the home: 0.9% NaCl at 25-50 ml/hr to maintain IV access for protocol meds. Epinephrine 0.3 ml IV/IM PRN and Benadryl 25-50 IV/IM PRN s/s of anaphylaxis.   Instructions [facility] Infusion Coordinator (RN) to assist per patient IV care needs in the home PRN.      04/05/17 0933       Durable Medical Equipment        Start     Ordered   04/01/17 1013  For home use only DME  lightweight manual wheelchair with seat cushion  Once    Comments:  Patient suffers from L Foot Metatarsal Amputation which impairs their ability to perform daily activities like prolonged ambulation in the home.  A RW will not resolve  issue with performing activities of daily living. A wheelchair will allow patient to safely perform daily activities. Patient is not able to propel themselves in the home using a standard weight wheelchair due to {weakness. Patient can self propel in the lightweight wheelchair.  Accessories: elevating leg rests (ELRs), wheel locks, extensions and anti-tippers.   04/01/17 1014   04/01/17 1012  For home use only DME Walker rolling  Once    Question:  Patient needs a walker to treat with the following condition  Answer:  Left foot infection    04/01/17 1014     Follow-up Information    HEWITT, JOHN, MD. Schedule an appointment as soon as possible for a visit in 2 day(s).   Specialty:  Orthopedic Surgery Contact information: [address] [place] [address] [phone]        [facility] Follow up.   Why:  A representative from [facility] will contact you to arrange start date and time for Nurse for IV antibiotics, and Physical therapist. Contact information: [address] [place] [address] [phone]        Primary Care MD. Schedule an appointment as soon as possible for a visit in 1 week(s).        POWELL,ALVIN C, MD. Schedule an appointment as soon as possible for a visit in 5 day(s).   Specialty:  Nephrology Why:  please check complete blood count at next visit Contact information: [place]  [address] [phone]          No Known Allergies  Consultations:   ID and orthopedic surgery  Other Procedures/Studies: Ir Fluoro Guide Cv Line Right  Result Date: 04/04/2017 INDICATION: OSTEOMYELITIS, ACCESS FOR LONG-TERM IV ANTIBIOTICS EXAM: TUNNELED PICC LINE WITH ULTRASOUND AND FLUOROSCOPIC GUIDANCE MEDICATIONS: 1% LIDOCAINE LOCALLY ANESTHESIA/SEDATION: None. FLUOROSCOPY TIME:  Fluoroscopy Time:  24 seconds (3 mGy). COMPLICATIONS: None immediate. PROCEDURE: Informed written consent was obtained from the patient after a discussion of the risks, benefits, and alternatives to treatment. Questions regarding the procedure were encouraged and answered. The right neck and chest were prepped with chlorhexidine in a sterile fashion, and a sterile drape was applied covering the operative field. Maximum barrier sterile technique with sterile gowns and gloves were used for the procedure. A timeout was performed prior to the initiation of the procedure. After creating a small venotomy incision, a micropuncture kit was  utilized to access the right internal jugular vein under direct, real-time ultrasound guidance after the overlying soft tissues were anesthetized with 1% lidocaine with epinephrine. Ultrasound image documentation was performed. The microwire was kinked to measure appropriate catheter length. The micropuncture sheath was exchanged for a peel-away sheath over a guidewire. A 5 French single lumen tunneled PICC measuring 25 cm was tunneled in a retrograde fashion from the anterior chest wall to the venotomy incision. The catheter was then placed through the peel-away sheath with tip ultimately positioned at the superior caval-atrial junction. Final catheter positioning was confirmed and documented with a spot radiographic image. The catheter aspirates and flushes normally. The catheter was flushed with appropriate volume  heparin dwells. The catheter exit site was secured with a 3-0 Ethilon retention suture. The venotomy incision was closed with derma bond. Dressings were applied. The patient tolerated the procedure well without immediate post procedural complication. FINDINGS: After catheter placement, the tip lies within the superior cavoatrial junction. The catheter aspirates and flushes normally and is ready for immediate use. IMPRESSION: Successful placement of 25cm single lumen tunneled PICC catheter via the right internal jugular vein with tip terminating at the superior caval atrial junction. The catheter is ready for immediate use. Electronically Signed   By: Jerilynn Mages.  Shick M.D.   On: 04/04/2017 15:18   Ir US Guide Vasc Access Right  Result Date: 04/04/2017 INDICATION: OSTEOMYELITIS, ACCESS FOR LONG-TERM IV ANTIBIOTICS EXAM: TUNNELED PICC LINE WITH ULTRASOUND AND FLUOROSCOPIC GUIDANCE MEDICATIONS: 1% LIDOCAINE LOCALLY ANESTHESIA/SEDATION: None. FLUOROSCOPY TIME:  Fluoroscopy Time:  24 seconds (3 mGy). COMPLICATIONS: None immediate. PROCEDURE: Informed written consent was obtained from the patient after a  discussion of the risks, benefits, and alternatives to treatment. Questions regarding the procedure were encouraged and answered. The right neck and chest were prepped with chlorhexidine in a sterile fashion, and a sterile drape was applied covering the operative field. Maximum barrier sterile technique with sterile gowns and gloves were used for the procedure. A timeout was performed prior to the initiation of the procedure. After creating a small venotomy incision, a micropuncture kit was utilized to access the right internal jugular vein under direct, real-time ultrasound guidance after the overlying soft tissues were anesthetized with 1% lidocaine with epinephrine. Ultrasound image documentation was performed. The microwire was kinked to measure appropriate catheter length. The micropuncture sheath was exchanged for a peel-away sheath over a guidewire. A 5 French single lumen tunneled PICC measuring 25 cm was tunneled in a retrograde fashion from the anterior chest wall to the venotomy incision. The catheter was then placed through the peel-away sheath with tip ultimately positioned at the superior caval-atrial junction. Final catheter positioning was confirmed and documented with a spot radiographic image. The catheter aspirates and flushes normally. The catheter was flushed with appropriate volume heparin dwells. The catheter exit site was secured with a 3-0 Ethilon retention suture. The venotomy incision was closed with derma bond. Dressings were applied. The patient tolerated the procedure well without immediate post procedural complication. FINDINGS: After catheter placement, the tip lies within the superior cavoatrial junction. The catheter aspirates and flushes normally and is ready for immediate use. IMPRESSION: Successful placement of 25cm single lumen tunneled PICC catheter via the right internal jugular vein with tip terminating at the superior caval atrial junction. The catheter is ready for  immediate use. Electronically Signed   By: Jerilynn Mages.  Shick M.D.   On: 04/04/2017 15:18   Dg Chest Port 1 View  Result Date: 03/30/2017 CLINICAL DATA:  Preop chest x-ray for foot ulcer history of hypertension and diabetes EXAM: PORTABLE CHEST 1 VIEW COMPARISON:  08/20/2013 FINDINGS: The heart size and mediastinal contours are within normal limits. Both lungs are clear. The visualized skeletal structures are unremarkable. IMPRESSION: No active disease. Electronically Signed   By: Donavan Foil M.D.   On: 03/30/2017 23:55   Dg Foot Complete Left  Result Date: 03/30/2017 CLINICAL DATA:  Wound  lower on bottom of left foot EXAM: LEFT FOOT - COMPLETE 3+ VIEW COMPARISON:  MRI 01/22/2017, radiographs 01/20/2017 FINDINGS: Patient is status post amputation of the second digit at the level of the MTP joint. Scattered pockets of soft tissue gas along the plantar surface of  the second through fourth digits. Chronic deformity of the distal fourth metatarsal. Partial amputation of the distal fourth digit as before. Possible erosive change at the base of the third proximal phalanx. Vascular calcifications IMPRESSION: 1. Possible erosive change at the base of the third proximal phalanx, with possible articular extension to the third MTP joint raises possibility for osteomyelitis/joint infection. 2. Soft tissue gas along the plantar surface of the second through fourth digits distally Electronically Signed   By: Donavan Foil M.D.   On: 03/30/2017 21:52     TODAY-DAY OF DISCHARGE:  Subjective:   [name] today has no headache,no chest abdominal pain,no new weakness tingling or numbness, feels much better wants to go home today.   Objective:   Blood pressure 127/63, pulse 70, temperature 98 F (36.7 C), temperature source Oral, resp. rate 18, height '6\' 4"'  (1.93 m), weight 106.6 kg (235 lb 1.6 oz), SpO2 94 %.  Intake/Output Summary (Last 24 hours) at 04/05/17 0943 Last data filed at 04/05/17 0646  Gross per 24  hour  Intake              600 ml  Output             2300 ml  Net            -1700 ml   Filed Weights   03/31/17 0007  Weight: 106.6 kg (235 lb 1.6 oz)    Exam: Awake Alert, Oriented *3, No new F.N deficits, Normal affect [place].AT,PERRAL Supple Neck,No JVD, No cervical lymphadenopathy appriciated.  Symmetrical Chest wall movement, Good air movement bilaterally, CTAB RRR,No Gallops,Rubs or new Murmurs, No Parasternal Heave +ve B.Sounds, Abd Soft, Non tender, No organomegaly appriciated, No rebound -guarding or rigidity. No Cyanosis, Clubbing or edema, No new Rash or bruise   PERTINENT RADIOLOGIC STUDIES: Ir Fluoro Guide Cv Line Right  Result Date: 04/04/2017 INDICATION: OSTEOMYELITIS, ACCESS FOR LONG-TERM IV ANTIBIOTICS EXAM: TUNNELED PICC LINE WITH ULTRASOUND AND FLUOROSCOPIC GUIDANCE MEDICATIONS: 1% LIDOCAINE LOCALLY ANESTHESIA/SEDATION: None. FLUOROSCOPY TIME:  Fluoroscopy Time:  24 seconds (3 mGy). COMPLICATIONS: None immediate. PROCEDURE: Informed written consent was obtained from the patient after a discussion of the risks, benefits, and alternatives to treatment. Questions regarding the procedure were encouraged and answered. The right neck and chest were prepped with chlorhexidine in a sterile fashion, and a sterile drape was applied covering the operative field. Maximum barrier sterile technique with sterile gowns and gloves were used for the procedure. A timeout was performed prior to the initiation of the procedure. After creating a small venotomy incision, a micropuncture kit was utilized to access the right internal jugular vein under direct, real-time ultrasound guidance after the overlying soft tissues were anesthetized with 1% lidocaine with epinephrine. Ultrasound image documentation was performed. The microwire was kinked to measure appropriate catheter length. The micropuncture sheath was exchanged for a peel-away sheath over a guidewire. A 5 French single lumen tunneled PICC  measuring 25 cm was tunneled in a retrograde fashion from the anterior chest wall to the venotomy incision. The catheter was then placed through the peel-away sheath with tip ultimately positioned at the superior caval-atrial junction. Final catheter positioning was confirmed and documented with a spot radiographic image. The catheter aspirates and flushes normally. The catheter was flushed with appropriate volume heparin dwells. The catheter exit site was secured with a 3-0 Ethilon retention suture. The venotomy incision was closed with derma bond. Dressings were applied. The patient tolerated the procedure well without immediate post procedural complication.  FINDINGS: After catheter placement, the tip lies within the superior cavoatrial junction. The catheter aspirates and flushes normally and is ready for immediate use. IMPRESSION: Successful placement of 25cm single lumen tunneled PICC catheter via the right internal jugular vein with tip terminating at the superior caval atrial junction. The catheter is ready for immediate use. Electronically Signed   By: Jerilynn Mages.  Shick M.D.   On: 04/04/2017 15:18   Ir US Guide Vasc Access Right  Result Date: 04/04/2017 INDICATION: OSTEOMYELITIS, ACCESS FOR LONG-TERM IV ANTIBIOTICS EXAM: TUNNELED PICC LINE WITH ULTRASOUND AND FLUOROSCOPIC GUIDANCE MEDICATIONS: 1% LIDOCAINE LOCALLY ANESTHESIA/SEDATION: None. FLUOROSCOPY TIME:  Fluoroscopy Time:  24 seconds (3 mGy). COMPLICATIONS: None immediate. PROCEDURE: Informed written consent was obtained from the patient after a discussion of the risks, benefits, and alternatives to treatment. Questions regarding the procedure were encouraged and answered. The right neck and chest were prepped with chlorhexidine in a sterile fashion, and a sterile drape was applied covering the operative field. Maximum barrier sterile technique with sterile gowns and gloves were used for the procedure. A timeout was performed prior to the initiation of the  procedure. After creating a small venotomy incision, a micropuncture kit was utilized to access the right internal jugular vein under direct, real-time ultrasound guidance after the overlying soft tissues were anesthetized with 1% lidocaine with epinephrine. Ultrasound image documentation was performed. The microwire was kinked to measure appropriate catheter length. The micropuncture sheath was exchanged for a peel-away sheath over a guidewire. A 5 French single lumen tunneled PICC measuring 25 cm was tunneled in a retrograde fashion from the anterior chest wall to the venotomy incision. The catheter was then placed through the peel-away sheath with tip ultimately positioned at the superior caval-atrial junction. Final catheter positioning was confirmed and documented with a spot radiographic image. The catheter aspirates and flushes normally. The catheter was flushed with appropriate volume heparin dwells. The catheter exit site was secured with a 3-0 Ethilon retention suture. The venotomy incision was closed with derma bond. Dressings were applied. The patient tolerated the procedure well without immediate post procedural complication. FINDINGS: After catheter placement, the tip lies within the superior cavoatrial junction. The catheter aspirates and flushes normally and is ready for immediate use. IMPRESSION: Successful placement of 25cm single lumen tunneled PICC catheter via the right internal jugular vein with tip terminating at the superior caval atrial junction. The catheter is ready for immediate use. Electronically Signed   By: Jerilynn Mages.  Shick M.D.   On: 04/04/2017 15:18   Dg Chest Port 1 View  Result Date: 03/30/2017 CLINICAL DATA:  Preop chest x-ray for foot ulcer history of hypertension and diabetes EXAM: PORTABLE CHEST 1 VIEW COMPARISON:  08/20/2013 FINDINGS: The heart size and mediastinal contours are within normal limits. Both lungs are clear. The visualized skeletal structures are unremarkable.  IMPRESSION: No active disease. Electronically Signed   By: Donavan Foil M.D.   On: 03/30/2017 23:55   Dg Foot Complete Left  Result Date: 03/30/2017 CLINICAL DATA:  Wound  lower on bottom of left foot EXAM: LEFT FOOT - COMPLETE 3+ VIEW COMPARISON:  MRI 01/22/2017, radiographs 01/20/2017 FINDINGS: Patient is status post amputation of the second digit at the level of the MTP joint. Scattered pockets of soft tissue gas along the plantar surface of the second through fourth digits. Chronic deformity of the distal fourth metatarsal. Partial amputation of the distal fourth digit as before. Possible erosive change at the base of the third proximal phalanx. Vascular calcifications IMPRESSION: 1.  Possible erosive change at the base of the third proximal phalanx, with possible articular extension to the third MTP joint raises possibility for osteomyelitis/joint infection. 2. Soft tissue gas along the plantar surface of the second through fourth digits distally Electronically Signed   By: Donavan Foil M.D.   On: 03/30/2017 21:52     PERTINENT LAB RESULTS: CBC:  Recent Labs  04/04/17 0738  WBC 19.9*  HGB 7.4*  HCT 23.5*  PLT 312   CMET CMP     Component Value Date/Time   NA 133 (L) 04/04/2017 0536   K 4.1 04/04/2017 0536   CL 103 04/04/2017 0536   CO2 17 (L) 04/04/2017 0536   GLUCOSE 133 (H) 04/04/2017 0536   BUN 74 (H) 04/04/2017 0536   CREATININE 5.39 (H) 04/04/2017 0536   CALCIUM 7.8 (L) 04/04/2017 0536   PROT 7.2 03/30/2017 2058   ALBUMIN 2.6 (L) 03/30/2017 2058   AST 21 03/30/2017 2058   ALT 22 03/30/2017 2058   ALKPHOS 68 03/30/2017 2058   BILITOT 0.5 03/30/2017 2058   GFRNONAA 11 (L) 04/04/2017 0536   GFRAA 12 (L) 04/04/2017 0536    GFR Estimated Creatinine Clearance: 20 mL/min (A) (by C-G formula based on SCr of 5.39 mg/dL (H)). No results for input(s): LIPASE, AMYLASE in the last 72 hours. No results for input(s): CKTOTAL, CKMB, CKMBINDEX, TROPONINI in the last 72  hours. Invalid input(s): POCBNP No results for input(s): DDIMER in the last 72 hours. No results for input(s): HGBA1C in the last 72 hours. No results for input(s): CHOL, HDL, LDLCALC, TRIG, CHOLHDL, LDLDIRECT in the last 72 hours. No results for input(s): TSH, T4TOTAL, T3FREE, THYROIDAB in the last 72 hours.  Invalid input(s): FREET3  Recent Labs  04/04/17 0946  VITAMINB12 501  FOLATE 6.1  FERRITIN 877*  TIBC 146*  IRON 33*  RETICCTPCT 0.9   Coags: No results for input(s): INR in the last 72 hours.  Invalid input(s): PT Microbiology: Recent Results (from the past 240 hour(s))  Blood Culture (routine x 2)     Status: None   Collection Time: 03/30/17  8:58 PM  Result Value Ref Range Status   Specimen Description BLOOD RIGHT WRIST  Final   Special Requests   Final    BOTTLES DRAWN AEROBIC AND ANAEROBIC Blood Culture adequate volume 5CC EACH   Culture NO GROWTH 5 DAYS  Final   Report Status 04/04/2017 FINAL  Final  Blood Culture (routine x 2)     Status: None   Collection Time: 03/30/17  9:08 PM  Result Value Ref Range Status   Specimen Description BLOOD RIGHT HAND  Final   Special Requests   Final    IN PEDIATRIC BOTTLE Blood Culture adequate volume 3CC   Culture NO GROWTH 5 DAYS  Final   Report Status 04/04/2017 FINAL  Final  Wound or Superficial Culture     Status: Abnormal   Collection Time: 03/30/17 10:32 PM  Result Value Ref Range Status   Specimen Description WOUND FOOT  Final   Special Requests NONE  Final   Gram Stain   Final    RARE WBC PRESENT, PREDOMINANTLY PMN MODERATE GRAM POSITIVE COCCI    Culture (A)  Final    MULTIPLE ORGANISMS PRESENT, NONE PREDOMINANT NO STAPHYLOCOCCUS AUREUS ISOLATED NO GROUP A STREP (S.PYOGENES) ISOLATED    Report Status 04/02/2017 FINAL  Final  Surgical pcr screen     Status: None   Collection Time: 03/31/17  1:08 AM  Result  Value Ref Range Status   MRSA, PCR NEGATIVE NEGATIVE Final   Staphylococcus aureus NEGATIVE  NEGATIVE Final    Comment:        The Xpert SA Assay (FDA approved for NASAL specimens in patients over 44 years of age), is one component of a comprehensive surveillance program.  Test performance has been validated by [facility] for patients greater than or equal to 57 year old. It is not intended to diagnose infection nor to guide or monitor treatment.     FURTHER DISCHARGE INSTRUCTIONS:  Get Medicines reviewed and adjusted: Please take all your medications with you for your next visit with your Primary MD  Laboratory/radiological data: Please request your Primary MD to go over all hospital tests and procedure/radiological results at the follow up, please ask your Primary MD to get all Hospital records sent to his/her office.  In some cases, they will be blood work, cultures and biopsy results pending at the time of your discharge. Please request that your primary care M.D. goes through all the records of your hospital data and follows up on these results.  Also Note the following: If you experience worsening of your admission symptoms, develop shortness of breath, life threatening emergency, suicidal or homicidal thoughts you must seek medical attention immediately by calling 911 or calling your MD immediately  if symptoms less severe.  You must read complete instructions/literature along with all the possible adverse reactions/side effects for all the Medicines you take and that have been prescribed to you. Take any new Medicines after you have completely understood and accpet all the possible adverse reactions/side effects.   Do not drive when taking Pain medications or sleeping medications (Benzodaizepines)  Do not take more than prescribed Pain, Sleep and Anxiety Medications. It is not advisable to combine anxiety,sleep and pain medications without talking with your primary care practitioner  Special Instructions: If you have smoked or chewed Tobacco  in the last 2 yrs  please stop smoking, stop any regular Alcohol  and or any Recreational drug use.  Wear Seat belts while driving.  Please note: You were cared for by a hospitalist during your hospital stay. Once you are discharged, your primary care physician will handle any further medical issues. Please note that NO REFILLS for any discharge medications will be authorized once you are discharged, as it is imperative that you return to your primary care physician (or establish a relationship with a primary care physician if you do not have one) for your post hospital discharge needs so that they can reassess your need for medications and monitor your lab values.  Total Time spent coordinating discharge including counseling, education and face to face time equals 45 minutes.  SignedOren Binet 04/05/2017 9:43 AM

## 2017-04-10 ENCOUNTER — Encounter: Payer: Self-pay | Admitting: Infectious Diseases

## 2017-04-17 ENCOUNTER — Encounter: Payer: Self-pay | Admitting: Infectious Diseases

## 2017-05-07 ENCOUNTER — Encounter: Payer: Self-pay | Admitting: Vascular Surgery

## 2017-05-08 ENCOUNTER — Encounter (HOSPITAL_BASED_OUTPATIENT_CLINIC_OR_DEPARTMENT_OTHER): Payer: BLUE CROSS/BLUE SHIELD | Attending: Surgery

## 2017-05-08 ENCOUNTER — Other Ambulatory Visit (HOSPITAL_COMMUNITY)
Admission: RE | Admit: 2017-05-08 | Discharge: 2017-05-08 | Disposition: A | Discharge status: Home / Self Care | Payer: BLUE CROSS/BLUE SHIELD | Source: Other Acute Inpatient Hospital | Attending: Surgery | Admitting: Surgery

## 2017-05-08 DIAGNOSIS — E1151 Type 2 diabetes mellitus with diabetic peripheral angiopathy without gangrene: Secondary | ICD-10-CM | POA: Insufficient documentation

## 2017-05-08 DIAGNOSIS — Z87891 Personal history of nicotine dependence: Secondary | ICD-10-CM | POA: Diagnosis not present

## 2017-05-08 DIAGNOSIS — E11621 Type 2 diabetes mellitus with foot ulcer: Secondary | ICD-10-CM | POA: Diagnosis present

## 2017-05-08 DIAGNOSIS — X58XXXA Exposure to other specified factors, initial encounter: Secondary | ICD-10-CM | POA: Insufficient documentation

## 2017-05-08 DIAGNOSIS — Z794 Long term (current) use of insulin: Secondary | ICD-10-CM | POA: Diagnosis not present

## 2017-05-08 DIAGNOSIS — Y838 Other surgical procedures as the cause of abnormal reaction of the patient, or of later complication, without mention of misadventure at the time of the procedure: Secondary | ICD-10-CM | POA: Insufficient documentation

## 2017-05-08 DIAGNOSIS — E1122 Type 2 diabetes mellitus with diabetic chronic kidney disease: Secondary | ICD-10-CM | POA: Insufficient documentation

## 2017-05-08 DIAGNOSIS — I129 Hypertensive chronic kidney disease with stage 1 through stage 4 chronic kidney disease, or unspecified chronic kidney disease: Secondary | ICD-10-CM | POA: Diagnosis not present

## 2017-05-08 DIAGNOSIS — T8131XA Disruption of external operation (surgical) wound, not elsewhere classified, initial encounter: Secondary | ICD-10-CM | POA: Diagnosis not present

## 2017-05-08 DIAGNOSIS — Z89431 Acquired absence of right foot: Secondary | ICD-10-CM | POA: Insufficient documentation

## 2017-05-08 DIAGNOSIS — N186 End stage renal disease: Secondary | ICD-10-CM | POA: Diagnosis not present

## 2017-05-08 DIAGNOSIS — S91302A Unspecified open wound, left foot, initial encounter: Secondary | ICD-10-CM | POA: Insufficient documentation

## 2017-05-08 DIAGNOSIS — L97523 Non-pressure chronic ulcer of other part of left foot with necrosis of muscle: Secondary | ICD-10-CM | POA: Insufficient documentation

## 2017-05-08 DIAGNOSIS — Z89422 Acquired absence of other left toe(s): Secondary | ICD-10-CM | POA: Diagnosis not present

## 2017-05-08 DIAGNOSIS — E1140 Type 2 diabetes mellitus with diabetic neuropathy, unspecified: Secondary | ICD-10-CM | POA: Insufficient documentation

## 2017-05-10 ENCOUNTER — Ambulatory Visit (HOSPITAL_COMMUNITY)
Admission: RE | Admit: 2017-05-10 | Discharge: 2017-05-10 | Disposition: A | Discharge status: Home / Self Care | Payer: BLUE CROSS/BLUE SHIELD | Source: Ambulatory Visit | Attending: Vascular Surgery | Admitting: Vascular Surgery

## 2017-05-10 ENCOUNTER — Ambulatory Visit (INDEPENDENT_AMBULATORY_CARE_PROVIDER_SITE_OTHER): Payer: Self-pay | Admitting: Vascular Surgery

## 2017-05-10 ENCOUNTER — Encounter: Payer: Self-pay | Admitting: Vascular Surgery

## 2017-05-10 VITALS — BP 140/73 | HR 79 | Temp 97.4°F | Resp 18 | Ht 76.0 in | Wt 216.2 lb

## 2017-05-10 DIAGNOSIS — N185 Chronic kidney disease, stage 5: Secondary | ICD-10-CM

## 2017-05-10 LAB — AEROBIC CULTURE W GRAM STAIN (SUPERFICIAL SPECIMEN)

## 2017-05-10 NOTE — Addendum Note (Signed)
Addended by: Lianne Cure A on: 05/10/2017 04:08 PM   Modules accepted: Orders

## 2017-05-10 NOTE — Progress Notes (Signed)
Patient is a 56 year old male who returns for follow-up today. He had placement of a left radiocephalic AV fistula 95/06/2256. He currently is not on hemodialysis. His nephrologist is Dr. Florene Glen. He denies any numbness or tingling in his hand. He has not been exercising fistula.  Physical exam:  Vitals:   05/10/17 1508  BP: 140/73  Pulse: 79  Resp: 18  Temp: 97.4 F (36.3 C)  TempSrc: Oral  SpO2: 100%  Weight: 216 lb 3.2 oz (98.1 kg)  Height: 6\' 4"  (1.93 m)    Left upper extremity: Palpable thrill audible bruit in fistula well-healed left wrist incision  Data: Duplex ultrasound shows the fistula diameter is about 3 mm. It is 3 mm from the skin surface. No internal narrowing.  Assessment: Small fistula at this point. I believe we should give him an opportunity to exercise the fistula for a few weeks to see if it will develop. The patient will follow-up with me in 6 weeks' time with a repeat duplex. If the fistula has not matured that time we will consider ligation of side branches and possible fistulogram.  Plan: See above  Ruta Hinds, MD [facility] Office: [phone] Pager: [phone]

## 2017-05-15 DIAGNOSIS — E11621 Type 2 diabetes mellitus with foot ulcer: Secondary | ICD-10-CM | POA: Diagnosis not present

## 2017-05-22 ENCOUNTER — Telehealth: Payer: Self-pay | Admitting: *Deleted

## 2017-05-22 DIAGNOSIS — E11621 Type 2 diabetes mellitus with foot ulcer: Secondary | ICD-10-CM | POA: Diagnosis not present

## 2017-05-22 NOTE — Telephone Encounter (Signed)
Received message from patient asking for advice. He states he completed IV antibiotics 2 weeks ago, but still has his PICC in place. Per records, there was an order from Dr Johnnye Sima to pull the PICC at completion of IV antibiotics on 5/29.  RN spoke with Coretta at [facility]. She is sending this to [facility]. Landis Gandy, RN

## 2017-05-25 ENCOUNTER — Other Ambulatory Visit: Payer: Self-pay | Admitting: Infectious Diseases

## 2017-05-25 DIAGNOSIS — M869 Osteomyelitis, unspecified: Secondary | ICD-10-CM

## 2017-05-29 ENCOUNTER — Encounter (HOSPITAL_COMMUNITY): Payer: Self-pay | Admitting: *Deleted

## 2017-05-29 NOTE — Progress Notes (Signed)
Spoke with pt for pre-op call. Pt denies cardiac history, chest pain or sob. Pt is diabetic, type 2. Last A1C was 7.0 on 03/31/17. States his fasting blood sugar is usually around 120. Instructed pt to take 70% of his usual dose of Humulin 70/30 this evening (will take 21 units). Instructed pt to check blood sugar in the AM when he gets up and every 2 hours until he leaves for the hospital. If blood sugar is 70 or below, treat with 1/2 cup of clear juice (apple or cranberry) and recheck blood sugar 15 minutes after drinking juice. If blood sugar continues to be 70 or below, call the Short Stay department and ask to speak to a nurse. Pt voiced understanding.

## 2017-05-30 ENCOUNTER — Encounter (HOSPITAL_COMMUNITY): Payer: Self-pay | Admitting: Surgery

## 2017-05-30 ENCOUNTER — Inpatient Hospital Stay (HOSPITAL_COMMUNITY)
Admission: RE | Admit: 2017-05-30 | Discharge: 2017-06-04 | DRG: 240 | Disposition: A | Discharge status: Rehabilitation Facility | Payer: Medicare Other | Source: Ambulatory Visit | Attending: Orthopedic Surgery | Admitting: Orthopedic Surgery

## 2017-05-30 ENCOUNTER — Inpatient Hospital Stay (HOSPITAL_COMMUNITY): Payer: Medicare Other | Admitting: Certified Registered Nurse Anesthetist

## 2017-05-30 ENCOUNTER — Encounter (HOSPITAL_COMMUNITY)
Admission: RE | Disposition: A | Discharge status: Rehabilitation Facility | Payer: Self-pay | Source: Ambulatory Visit | Attending: Orthopedic Surgery

## 2017-05-30 DIAGNOSIS — E1142 Type 2 diabetes mellitus with diabetic polyneuropathy: Secondary | ICD-10-CM | POA: Diagnosis present

## 2017-05-30 DIAGNOSIS — Z4781 Encounter for orthopedic aftercare following surgical amputation: Secondary | ICD-10-CM | POA: Diagnosis present

## 2017-05-30 DIAGNOSIS — Z89512 Acquired absence of left leg below knee: Secondary | ICD-10-CM | POA: Diagnosis not present

## 2017-05-30 DIAGNOSIS — Z89431 Acquired absence of right foot: Secondary | ICD-10-CM | POA: Diagnosis not present

## 2017-05-30 DIAGNOSIS — D62 Acute posthemorrhagic anemia: Secondary | ICD-10-CM | POA: Diagnosis present

## 2017-05-30 DIAGNOSIS — Z8249 Family history of ischemic heart disease and other diseases of the circulatory system: Secondary | ICD-10-CM | POA: Diagnosis not present

## 2017-05-30 DIAGNOSIS — I10 Essential (primary) hypertension: Secondary | ICD-10-CM | POA: Diagnosis present

## 2017-05-30 DIAGNOSIS — S88112S Complete traumatic amputation at level between knee and ankle, left lower leg, sequela: Secondary | ICD-10-CM | POA: Diagnosis not present

## 2017-05-30 DIAGNOSIS — Z794 Long term (current) use of insulin: Secondary | ICD-10-CM

## 2017-05-30 DIAGNOSIS — K5903 Drug induced constipation: Secondary | ICD-10-CM | POA: Diagnosis not present

## 2017-05-30 DIAGNOSIS — I129 Hypertensive chronic kidney disease with stage 1 through stage 4 chronic kidney disease, or unspecified chronic kidney disease: Secondary | ICD-10-CM | POA: Diagnosis present

## 2017-05-30 DIAGNOSIS — E1140 Type 2 diabetes mellitus with diabetic neuropathy, unspecified: Secondary | ICD-10-CM | POA: Diagnosis present

## 2017-05-30 DIAGNOSIS — E1152 Type 2 diabetes mellitus with diabetic peripheral angiopathy with gangrene: Secondary | ICD-10-CM | POA: Diagnosis present

## 2017-05-30 DIAGNOSIS — N184 Chronic kidney disease, stage 4 (severe): Secondary | ICD-10-CM | POA: Diagnosis present

## 2017-05-30 DIAGNOSIS — D638 Anemia in other chronic diseases classified elsewhere: Secondary | ICD-10-CM | POA: Diagnosis present

## 2017-05-30 DIAGNOSIS — K219 Gastro-esophageal reflux disease without esophagitis: Secondary | ICD-10-CM | POA: Diagnosis present

## 2017-05-30 DIAGNOSIS — Z87891 Personal history of nicotine dependence: Secondary | ICD-10-CM

## 2017-05-30 DIAGNOSIS — E1122 Type 2 diabetes mellitus with diabetic chronic kidney disease: Secondary | ICD-10-CM | POA: Diagnosis present

## 2017-05-30 DIAGNOSIS — G546 Phantom limb syndrome with pain: Secondary | ICD-10-CM | POA: Diagnosis not present

## 2017-05-30 DIAGNOSIS — E875 Hyperkalemia: Secondary | ICD-10-CM | POA: Diagnosis present

## 2017-05-30 DIAGNOSIS — Z833 Family history of diabetes mellitus: Secondary | ICD-10-CM

## 2017-05-30 DIAGNOSIS — K5900 Constipation, unspecified: Secondary | ICD-10-CM | POA: Diagnosis present

## 2017-05-30 DIAGNOSIS — Z79899 Other long term (current) drug therapy: Secondary | ICD-10-CM | POA: Diagnosis not present

## 2017-05-30 DIAGNOSIS — Z89511 Acquired absence of right leg below knee: Secondary | ICD-10-CM

## 2017-05-30 DIAGNOSIS — R269 Unspecified abnormalities of gait and mobility: Secondary | ICD-10-CM | POA: Diagnosis not present

## 2017-05-30 DIAGNOSIS — G8918 Other acute postprocedural pain: Secondary | ICD-10-CM

## 2017-05-30 DIAGNOSIS — E785 Hyperlipidemia, unspecified: Secondary | ICD-10-CM | POA: Diagnosis present

## 2017-05-30 HISTORY — DX: Anemia, unspecified: D64.9

## 2017-05-30 HISTORY — DX: Polyneuropathy, unspecified: G62.9

## 2017-05-30 HISTORY — PX: AMPUTATION: SHX166

## 2017-05-30 LAB — GLUCOSE, CAPILLARY
GLUCOSE-CAPILLARY: 173 mg/dL — AB (ref 65–99)
Glucose-Capillary: 181 mg/dL — ABNORMAL HIGH (ref 65–99)
Glucose-Capillary: 191 mg/dL — ABNORMAL HIGH (ref 65–99)

## 2017-05-30 LAB — CBC
HEMATOCRIT: 31.4 % — AB (ref 39.0–52.0)
Hemoglobin: 9.5 g/dL — ABNORMAL LOW (ref 13.0–17.0)
MCH: 21.8 pg — AB (ref 26.0–34.0)
MCHC: 30.3 g/dL (ref 30.0–36.0)
MCV: 72.2 fL — AB (ref 78.0–100.0)
Platelets: 368 10*3/uL (ref 150–400)
RBC: 4.35 MIL/uL (ref 4.22–5.81)
RDW: 15.7 % — ABNORMAL HIGH (ref 11.5–15.5)
WBC: 10.9 10*3/uL — ABNORMAL HIGH (ref 4.0–10.5)

## 2017-05-30 LAB — BASIC METABOLIC PANEL
Anion gap: 10 (ref 5–15)
BUN: 49 mg/dL — AB (ref 6–20)
CHLORIDE: 106 mmol/L (ref 101–111)
CO2: 19 mmol/L — AB (ref 22–32)
CREATININE: 3.85 mg/dL — AB (ref 0.61–1.24)
Calcium: 8.9 mg/dL (ref 8.9–10.3)
GFR calc Af Amer: 18 mL/min — ABNORMAL LOW (ref >60)
GFR calc non Af Amer: 16 mL/min — ABNORMAL LOW (ref >60)
Glucose, Bld: 165 mg/dL — ABNORMAL HIGH (ref 65–99)
POTASSIUM: 4.6 mmol/L (ref 3.5–5.1)
Sodium: 135 mmol/L (ref 135–145)

## 2017-05-30 SURGERY — AMPUTATION BELOW KNEE
Anesthesia: General | Site: Leg Lower | Laterality: Left

## 2017-05-30 MED ORDER — HYDROMORPHONE HCL 1 MG/ML IJ SOLN
1.0000 mg | INTRAMUSCULAR | Status: DC | PRN
  Administered 2017-05-31 – 2017-06-04 (×31): 1 mg via INTRAVENOUS
  Filled 2017-05-30 (×32): qty 1

## 2017-05-30 MED ORDER — DOCUSATE SODIUM 100 MG PO CAPS
100.0000 mg | ORAL_CAPSULE | Freq: Two times a day (BID) | ORAL | Status: DC
  Administered 2017-05-30 – 2017-06-04 (×10): 100 mg via ORAL
  Filled 2017-05-30 (×10): qty 1

## 2017-05-30 MED ORDER — IRON 325 (65 FE) MG PO TABS: 1.0000 | ORAL_TABLET | Freq: Every day | ORAL | Status: DC

## 2017-05-30 MED ORDER — PHENYLEPHRINE HCL 10 MG/ML IJ SOLN
INTRAMUSCULAR | Status: DC | PRN
  Administered 2017-05-30: 50 ug/min via INTRAVENOUS

## 2017-05-30 MED ORDER — CEFAZOLIN SODIUM-DEXTROSE 1-4 GM/50ML-% IV SOLN
1.0000 g | Freq: Four times a day (QID) | INTRAVENOUS | Status: DC
  Administered 2017-05-30 – 2017-05-31 (×2): 1 g via INTRAVENOUS
  Filled 2017-05-30 (×3): qty 50

## 2017-05-30 MED ORDER — HYDROMORPHONE HCL 1 MG/ML IJ SOLN: 0.2500 mg | INTRAMUSCULAR | Status: DC | PRN

## 2017-05-30 MED ORDER — SODIUM CHLORIDE 0.9 % IV SOLN
INTRAVENOUS | Status: DC
  Administered 2017-05-30 (×2): via INTRAVENOUS

## 2017-05-30 MED ORDER — ACETAMINOPHEN 650 MG RE SUPP: 650.0000 mg | Freq: Four times a day (QID) | RECTAL | Status: DC | PRN

## 2017-05-30 MED ORDER — PROPOFOL 10 MG/ML IV BOLUS
INTRAVENOUS | Status: DC | PRN
  Administered 2017-05-30: 170 mg via INTRAVENOUS

## 2017-05-30 MED ORDER — CEFAZOLIN SODIUM-DEXTROSE 2-4 GM/100ML-% IV SOLN
INTRAVENOUS | Status: AC
  Filled 2017-05-30: qty 100

## 2017-05-30 MED ORDER — MIDAZOLAM HCL 2 MG/2ML IJ SOLN
2.0000 mg | Freq: Once | INTRAMUSCULAR | Status: AC
  Administered 2017-05-30: 2 mg via INTRAVENOUS

## 2017-05-30 MED ORDER — CHLORHEXIDINE GLUCONATE 4 % EX LIQD: 60.0000 mL | Freq: Once | CUTANEOUS | Status: DC

## 2017-05-30 MED ORDER — PROMETHAZINE HCL 25 MG/ML IJ SOLN: 6.2500 mg | INTRAMUSCULAR | Status: DC | PRN

## 2017-05-30 MED ORDER — SODIUM CHLORIDE 0.9 % IV SOLN
INTRAVENOUS | Status: DC
  Administered 2017-05-30 – 2017-06-01 (×2): via INTRAVENOUS

## 2017-05-30 MED ORDER — ATORVASTATIN CALCIUM 10 MG PO TABS
10.0000 mg | ORAL_TABLET | Freq: Every day | ORAL | Status: DC
  Administered 2017-05-30 – 2017-06-03 (×5): 10 mg via ORAL
  Filled 2017-05-30 (×5): qty 1

## 2017-05-30 MED ORDER — GABAPENTIN 300 MG PO CAPS
300.0000 mg | ORAL_CAPSULE | Freq: Three times a day (TID) | ORAL | Status: DC
  Administered 2017-05-30 – 2017-05-31 (×2): 300 mg via ORAL
  Filled 2017-05-30 (×2): qty 1

## 2017-05-30 MED ORDER — VANCOMYCIN HCL 1000 MG IV SOLR
INTRAVENOUS | Status: AC
  Filled 2017-05-30: qty 1000

## 2017-05-30 MED ORDER — FENTANYL CITRATE (PF) 100 MCG/2ML IJ SOLN
INTRAMUSCULAR | Status: DC | PRN
  Administered 2017-05-30: 50 ug via INTRAVENOUS
  Administered 2017-05-30: 100 ug via INTRAVENOUS

## 2017-05-30 MED ORDER — OXYCODONE HCL 5 MG PO TABS
5.0000 mg | ORAL_TABLET | ORAL | Status: DC | PRN
  Administered 2017-05-30 – 2017-06-03 (×22): 10 mg via ORAL
  Administered 2017-06-03: 5 mg via ORAL
  Administered 2017-06-03 – 2017-06-04 (×9): 10 mg via ORAL
  Filled 2017-05-30 (×32): qty 2

## 2017-05-30 MED ORDER — SENNA 8.6 MG PO TABS
1.0000 | ORAL_TABLET | Freq: Two times a day (BID) | ORAL | Status: DC
  Administered 2017-05-30 – 2017-06-04 (×10): 8.6 mg via ORAL
  Filled 2017-05-30 (×10): qty 1

## 2017-05-30 MED ORDER — AMLODIPINE BESYLATE 10 MG PO TABS
10.0000 mg | ORAL_TABLET | Freq: Every day | ORAL | Status: DC
  Administered 2017-05-30 – 2017-06-03 (×5): 10 mg via ORAL
  Filled 2017-05-30 (×5): qty 1

## 2017-05-30 MED ORDER — VANCOMYCIN HCL 1000 MG IV SOLR
INTRAVENOUS | Status: DC | PRN
  Administered 2017-05-30: 1000 mg

## 2017-05-30 MED ORDER — FENTANYL CITRATE (PF) 250 MCG/5ML IJ SOLN
INTRAMUSCULAR | Status: AC
  Filled 2017-05-30: qty 5

## 2017-05-30 MED ORDER — FENTANYL CITRATE (PF) 100 MCG/2ML IJ SOLN
50.0000 ug | Freq: Once | INTRAMUSCULAR | Status: AC
  Administered 2017-05-30: 50 ug via INTRAVENOUS

## 2017-05-30 MED ORDER — FENTANYL CITRATE (PF) 100 MCG/2ML IJ SOLN
INTRAMUSCULAR | Status: AC
  Administered 2017-05-30: 50 ug via INTRAVENOUS
  Filled 2017-05-30: qty 2

## 2017-05-30 MED ORDER — CARVEDILOL 25 MG PO TABS
25.0000 mg | ORAL_TABLET | Freq: Two times a day (BID) | ORAL | Status: DC
  Administered 2017-05-31 – 2017-06-04 (×10): 25 mg via ORAL
  Filled 2017-05-30 (×10): qty 1

## 2017-05-30 MED ORDER — ACETAMINOPHEN 325 MG PO TABS: 650.0000 mg | ORAL_TABLET | Freq: Four times a day (QID) | ORAL | Status: DC | PRN

## 2017-05-30 MED ORDER — BUPIVACAINE-EPINEPHRINE (PF) 0.5% -1:200000 IJ SOLN
INTRAMUSCULAR | Status: DC | PRN
  Administered 2017-05-30: 30 mL via PERINEURAL

## 2017-05-30 MED ORDER — INSULIN GLARGINE 100 UNIT/ML ~~LOC~~ SOLN
5.0000 [IU] | Freq: Once | SUBCUTANEOUS | Status: AC
  Administered 2017-05-31: 5 [IU] via SUBCUTANEOUS
  Filled 2017-05-30 (×2): qty 0.05

## 2017-05-30 MED ORDER — DEXAMETHASONE SODIUM PHOSPHATE 10 MG/ML IJ SOLN
INTRAMUSCULAR | Status: DC | PRN
  Administered 2017-05-30: 5 mg via INTRAVENOUS

## 2017-05-30 MED ORDER — ONDANSETRON HCL 4 MG/2ML IJ SOLN
INTRAMUSCULAR | Status: AC
  Filled 2017-05-30: qty 2

## 2017-05-30 MED ORDER — LIDOCAINE-EPINEPHRINE (PF) 1.5 %-1:200000 IJ SOLN
INTRAMUSCULAR | Status: DC | PRN
  Administered 2017-05-30: 30 mL via PERINEURAL

## 2017-05-30 MED ORDER — MIDAZOLAM HCL 2 MG/2ML IJ SOLN
INTRAMUSCULAR | Status: AC
  Administered 2017-05-30: 2 mg via INTRAVENOUS
  Filled 2017-05-30: qty 2

## 2017-05-30 MED ORDER — CEFAZOLIN SODIUM-DEXTROSE 2-4 GM/100ML-% IV SOLN
2.0000 g | INTRAVENOUS | Status: AC
  Administered 2017-05-30: 2 g via INTRAVENOUS

## 2017-05-30 MED ORDER — CARVEDILOL 25 MG PO TABS
25.0000 mg | ORAL_TABLET | Freq: Once | ORAL | Status: AC
  Administered 2017-05-30: 25 mg via ORAL
  Filled 2017-05-30: qty 1

## 2017-05-30 MED ORDER — CARVEDILOL 12.5 MG PO TABS
ORAL_TABLET | ORAL | Status: AC
  Administered 2017-05-30: 25 mg via ORAL
  Filled 2017-05-30: qty 2

## 2017-05-30 MED ORDER — DIPHENHYDRAMINE HCL 50 MG/ML IJ SOLN
INTRAMUSCULAR | Status: DC | PRN
  Administered 2017-05-30: 12.5 mg via INTRAVENOUS

## 2017-05-30 MED ORDER — ONDANSETRON HCL 4 MG/2ML IJ SOLN: 4.0000 mg | Freq: Four times a day (QID) | INTRAMUSCULAR | Status: DC | PRN

## 2017-05-30 MED ORDER — ENOXAPARIN SODIUM 30 MG/0.3ML ~~LOC~~ SOLN
30.0000 mg | SUBCUTANEOUS | Status: DC
  Administered 2017-05-31 – 2017-06-04 (×5): 30 mg via SUBCUTANEOUS
  Filled 2017-05-30 (×5): qty 0.3

## 2017-05-30 MED ORDER — DIPHENHYDRAMINE HCL 12.5 MG/5ML PO ELIX: 12.5000 mg | ORAL_SOLUTION | ORAL | Status: DC | PRN

## 2017-05-30 MED ORDER — DIPHENHYDRAMINE HCL 50 MG/ML IJ SOLN
INTRAMUSCULAR | Status: AC
  Filled 2017-05-30: qty 1

## 2017-05-30 MED ORDER — SODIUM BICARBONATE 650 MG PO TABS
650.0000 mg | ORAL_TABLET | Freq: Two times a day (BID) | ORAL | Status: DC
  Administered 2017-05-30 – 2017-06-04 (×10): 650 mg via ORAL
  Filled 2017-05-30 (×10): qty 1

## 2017-05-30 MED ORDER — INSULIN GLARGINE 100 UNIT/ML ~~LOC~~ SOLN
12.0000 [IU] | Freq: Two times a day (BID) | SUBCUTANEOUS | Status: DC
  Administered 2017-05-31 – 2017-06-04 (×9): 12 [IU] via SUBCUTANEOUS
  Filled 2017-05-30 (×10): qty 0.12

## 2017-05-30 MED ORDER — DEXAMETHASONE SODIUM PHOSPHATE 10 MG/ML IJ SOLN
INTRAMUSCULAR | Status: AC
  Filled 2017-05-30: qty 1

## 2017-05-30 MED ORDER — INSULIN ASPART 100 UNIT/ML ~~LOC~~ SOLN
0.0000 [IU] | Freq: Three times a day (TID) | SUBCUTANEOUS | Status: DC
  Administered 2017-05-31 (×3): 5 [IU] via SUBCUTANEOUS
  Administered 2017-06-01 – 2017-06-02 (×4): 3 [IU] via SUBCUTANEOUS
  Administered 2017-06-02: 2 [IU] via SUBCUTANEOUS
  Administered 2017-06-03: 3 [IU] via SUBCUTANEOUS
  Administered 2017-06-03 – 2017-06-04 (×3): 2 [IU] via SUBCUTANEOUS

## 2017-05-30 MED ORDER — ONDANSETRON HCL 4 MG/2ML IJ SOLN
INTRAMUSCULAR | Status: DC | PRN
  Administered 2017-05-30: 4 mg via INTRAVENOUS

## 2017-05-30 MED ORDER — MIDAZOLAM HCL 2 MG/2ML IJ SOLN
INTRAMUSCULAR | Status: AC
  Filled 2017-05-30: qty 2

## 2017-05-30 MED ORDER — PHENYLEPHRINE 40 MCG/ML (10ML) SYRINGE FOR IV PUSH (FOR BLOOD PRESSURE SUPPORT)
PREFILLED_SYRINGE | INTRAVENOUS | Status: DC | PRN
  Administered 2017-05-30: 80 ug via INTRAVENOUS

## 2017-05-30 MED ORDER — SODIUM CHLORIDE 0.9 % IR SOLN
Status: DC | PRN
  Administered 2017-05-30: 1000 mL

## 2017-05-30 MED ORDER — ONDANSETRON HCL 4 MG PO TABS: 4.0000 mg | ORAL_TABLET | Freq: Four times a day (QID) | ORAL | Status: DC | PRN

## 2017-05-30 MED ORDER — PROPOFOL 10 MG/ML IV BOLUS
INTRAVENOUS | Status: AC
  Filled 2017-05-30: qty 20

## 2017-05-30 SURGICAL SUPPLY — 55 items
BANDAGE ACE 6X5 VEL STRL LF (GAUZE/BANDAGES/DRESSINGS) ×3 IMPLANT
BANDAGE ESMARK 6X9 LF (GAUZE/BANDAGES/DRESSINGS) IMPLANT
BLADE SAW RECIP 87.9 MT (BLADE) ×3 IMPLANT
BNDG COHESIVE 6X5 TAN STRL LF (GAUZE/BANDAGES/DRESSINGS) ×6 IMPLANT
BNDG ESMARK 6X9 LF (GAUZE/BANDAGES/DRESSINGS)
CANISTER SUCT 3000ML PPV (MISCELLANEOUS) ×3 IMPLANT
CHLORAPREP W/TINT 26ML (MISCELLANEOUS) ×3 IMPLANT
COVER SURGICAL LIGHT HANDLE (MISCELLANEOUS) ×3 IMPLANT
CUFF TOURNIQUET SINGLE 34IN LL (TOURNIQUET CUFF) IMPLANT
CUFF TOURNIQUET SINGLE 44IN (TOURNIQUET CUFF) IMPLANT
DRAPE HALF SHEET 40X57 (DRAPES) ×9 IMPLANT
DRAPE INCISE IOBAN 66X45 STRL (DRAPES) ×3 IMPLANT
DRAPE U-SHAPE 47X51 STRL (DRAPES) ×6 IMPLANT
DRSG MEPITEL 4X7.2 (GAUZE/BANDAGES/DRESSINGS) ×3 IMPLANT
DRSG PAD ABDOMINAL 8X10 ST (GAUZE/BANDAGES/DRESSINGS) ×9 IMPLANT
ELECT CAUTERY BLADE 6.4 (BLADE) IMPLANT
ELECT REM PT RETURN 9FT ADLT (ELECTROSURGICAL) ×3
ELECTRODE REM PT RTRN 9FT ADLT (ELECTROSURGICAL) ×1 IMPLANT
EVACUATOR 1/8 PVC DRAIN (DRAIN) IMPLANT
GAUZE SPONGE 4X4 12PLY STRL (GAUZE/BANDAGES/DRESSINGS) ×3 IMPLANT
GAUZE SPONGE 4X4 12PLY STRL LF (GAUZE/BANDAGES/DRESSINGS) ×3 IMPLANT
GLOVE BIO SURGEON STRL SZ8 (GLOVE) ×3 IMPLANT
GLOVE BIOGEL PI IND STRL 8 (GLOVE) ×2 IMPLANT
GLOVE BIOGEL PI INDICATOR 8 (GLOVE) ×4
GLOVE ECLIPSE 8.0 STRL XLNG CF (GLOVE) ×6 IMPLANT
GOWN STRL REUS W/ TWL LRG LVL3 (GOWN DISPOSABLE) ×1 IMPLANT
GOWN STRL REUS W/ TWL XL LVL3 (GOWN DISPOSABLE) ×1 IMPLANT
GOWN STRL REUS W/TWL LRG LVL3 (GOWN DISPOSABLE) ×2
GOWN STRL REUS W/TWL XL LVL3 (GOWN DISPOSABLE) ×2
IMMOBILIZER KNEE 24 THIGH 36 (MISCELLANEOUS) ×1 IMPLANT
IMMOBILIZER KNEE 24 UNIV (MISCELLANEOUS) ×3
KIT BASIN OR (CUSTOM PROCEDURE TRAY) ×3 IMPLANT
KIT ROOM TURNOVER OR (KITS) ×3 IMPLANT
NS IRRIG 1000ML POUR BTL (IV SOLUTION) ×3 IMPLANT
PACK GENERAL/GYN (CUSTOM PROCEDURE TRAY) ×3 IMPLANT
PAD ABD 8X10 STRL (GAUZE/BANDAGES/DRESSINGS) ×3 IMPLANT
PAD ARMBOARD 7.5X6 YLW CONV (MISCELLANEOUS) ×6 IMPLANT
PAD CAST 4YDX4 CTTN HI CHSV (CAST SUPPLIES) ×1 IMPLANT
PADDING CAST COTTON 4X4 STRL (CAST SUPPLIES) ×2
PADDING CAST COTTON 6X4 STRL (CAST SUPPLIES) ×3 IMPLANT
SPONGE LAP 18X18 X RAY DECT (DISPOSABLE) IMPLANT
STAPLER VISISTAT 35W (STAPLE) IMPLANT
STOCKINETTE IMPERVIOUS LG (DRAPES) IMPLANT
SUT MNCRL AB 3-0 PS2 18 (SUTURE) ×3 IMPLANT
SUT PDS 2 0 (SUTURE) IMPLANT
SUT PDS AB 0 CT 36 (SUTURE) ×3 IMPLANT
SUT PROLENE 3 0 PS 2 (SUTURE) ×3 IMPLANT
SUT PROLENE 3 0 SH 48 (SUTURE) IMPLANT
SUT SILK 2 0 (SUTURE) ×2
SUT SILK 2-0 18XBRD TIE 12 (SUTURE) ×1 IMPLANT
SWAB COLLECTION DEVICE MRSA (MISCELLANEOUS) IMPLANT
SWAB CULTURE ESWAB REG 1ML (MISCELLANEOUS) IMPLANT
TOWEL OR 17X24 6PK STRL BLUE (TOWEL DISPOSABLE) ×3 IMPLANT
TOWEL OR 17X26 10 PK STRL BLUE (TOWEL DISPOSABLE) ×3 IMPLANT
WATER STERILE IRR 1000ML POUR (IV SOLUTION) ×3 IMPLANT

## 2017-05-30 NOTE — Anesthesia Postprocedure Evaluation (Signed)
Anesthesia Post Note  Patient: [name]  Procedure(s) Performed: Procedure(s) (LRB): AMPUTATION BELOW KNEE (Left)     Patient location during evaluation: PACU Anesthesia Type: General Level of consciousness: sedated Pain management: pain level controlled Vital Signs Assessment: post-procedure vital signs reviewed and stable Respiratory status: spontaneous breathing and respiratory function stable Cardiovascular status: stable Anesthetic complications: no    Last Pain:  Vitals:   05/30/17 1700  TempSrc:   PainSc: 0-No pain   Pain Goal: Patients Stated Pain Goal: 3 (05/30/17 1254)               [name]

## 2017-05-30 NOTE — Anesthesia Procedure Notes (Signed)
Anesthesia Regional Block: Popliteal block   Pre-Anesthetic Checklist: ,, timeout performed, Correct Patient, Correct Site, Correct Laterality, Correct Procedure, Correct Position, site marked, Risks and benefits discussed,  Surgical consent,  Pre-op evaluation,  At surgeon's request and post-op pain management  Laterality: Left  Prep: chloraprep       Needles:  Injection technique: Single-shot  Needle Type: Echogenic Stimulator Needle     Needle Length: 9cm  Needle Gauge: 21     Additional Needles:   Procedures: ultrasound guided, nerve stimulator,,,,,,   Nerve Stimulator or Paresthesia:  Response: 0.4 mA,   Additional Responses:   Narrative:  Start time: 05/30/2017 2:40 PM End time: 05/30/2017 2:55 PM Injection made incrementally with aspirations every 5 mL.  Performed by: Personally  Anesthesiologist: Lillia Abed  Additional Notes: Monitors applied. Patient sedated. Sterile prep and drape,hand hygiene and sterile gloves were used. Relevant anatomy identified.Needle position confirmed.Local anesthetic injected incrementally after negative aspiration. Local anesthetic spread visualized around nerve(s). Vascular puncture avoided. No complications. Image printed for medical record.The patient tolerated the procedure well.  Additional Saphenous nerve block performed. 15cc Local Anesthetic mixture placed under ultrasonic guidance along the medio-inferior border of the Sartorious muscle 6 inches above the knee.  No Problems encountered.  Lillia Abed MD

## 2017-05-30 NOTE — Anesthesia Preprocedure Evaluation (Addendum)
Anesthesia Evaluation  Patient identified by MRN, date of birth, ID band Patient awake    Reviewed: Allergy & Precautions, NPO status , Patient's Chart, lab work & pertinent test results  History of Anesthesia Complications Negative for: history of anesthetic complications  Airway Mallampati: II  TM Distance: >3 FB     Dental no notable dental hx. (+) Dental Advisory Given   Pulmonary former smoker,    Pulmonary exam normal        Cardiovascular hypertension, Pt. on medications and Pt. on home beta blockers Normal cardiovascular exam     Neuro/Psych    GI/Hepatic Neg liver ROS, GERD  ,  Endo/Other  diabetes  Renal/GU      Musculoskeletal   Abdominal   Peds  Hematology   Anesthesia Other Findings   Reproductive/Obstetrics                             Anesthesia Physical Anesthesia Plan  ASA: III  Anesthesia Plan: General   Post-op Pain Management: GA combined w/ Regional for post-op pain   Induction: Intravenous  PONV Risk Score and Plan: 3 and Ondansetron, Dexamethasone and Diphenhydramine  Airway Management Planned: LMA  Additional Equipment:   Intra-op Plan:   Post-operative Plan: Extubation in OR  Informed Consent: I have reviewed the patients History and Physical, chart, labs and discussed the procedure including the risks, benefits and alternatives for the proposed anesthesia with the patient or authorized representative who has indicated his/her understanding and acceptance.   Dental advisory given  Plan Discussed with: Anesthesiologist and CRNA  Anesthesia Plan Comments:        Anesthesia Quick Evaluation

## 2017-05-30 NOTE — Discharge Instructions (Signed)
[name] Simmer, MD [place]  Please read the following information regarding your care after surgery.  Medications  You only need a prescription for the narcotic pain medicine (ex. oxycodone, Percocet, Norco).  All of the other medicines listed below are available over the counter. X acetominophen (Tylenol) 650 mg every 4-6 hours as you need for minor pain X oxycodone as prescribed for moderate to severe pain ?   Narcotic pain medicine (ex. oxycodone, Percocet, Vicodin) will cause constipation.  To prevent this problem, take the following medicines while you are taking any pain medicine. X docusate sodium (Colace) 100 mg twice a day X senna (Senokot) 2 tablets twice a day  X To help prevent blood clots, take a baby aspirin (81 mg) twice a day after surgery.  You should also get up every hour while you are awake to move around.    Weight Bearing X Do not bear any weight on the operated leg or foot.  Dressing X Keep your dressing clean and dry.  Dont put anything (coat hanger, pencil, etc) down inside of it.  If it gets damp, use a hair dryer on the cool setting to dry it.  If it gets soaked, call the office to schedule an appointment for a dressing change.  Use your stump protector and stump shrinker at all times.  After your dressing, cast or splint is removed; you may shower, but do not soak or scrub the wound.  Allow the water to run over it, and then gently pat it dry.  Swelling It is normal for you to have swelling where you had surgery.  To reduce swelling and pain, keep your toes above your nose for at least 3 days after surgery.  It may be necessary to keep your foot or leg elevated for several weeks.  If it hurts, it should be elevated.  Follow Up Call my office at [phone] when you are discharged from the hospital or surgery center to schedule an appointment to be seen two weeks after surgery.  Call my office at [phone] if you develop a fever >101.5 F,  nausea, vomiting, bleeding from the surgical site or severe pain.

## 2017-05-30 NOTE — Progress Notes (Signed)
Pt admitted to unit via PACU. Orders released, dinner ordered. Immobilizer to left BKA. Pt alert and oriented, no distress, no c/o pain. Ace wrap in tact to left surgical leg.

## 2017-05-30 NOTE — Transfer of Care (Signed)
Immediate Anesthesia Transfer of Care Note  Patient: [name]  Procedure(s) Performed: Procedure(s): AMPUTATION BELOW KNEE (Left)  Patient Location: PACU  Anesthesia Type:GA combined with regional for post-op pain  Level of Consciousness: alert , oriented, drowsy and patient cooperative  Airway & Oxygen Therapy: Patient Spontanous Breathing and Patient connected to face mask oxygen  Post-op Assessment: Report given to RN and Post -op Vital signs reviewed and stable  Post vital signs: Reviewed and stable  Last Vitals:  Vitals:   05/30/17 1455 05/30/17 1637  BP: (!) 163/86   Pulse: 73 84  Resp: 12 (!) 7  Temp:      Last Pain:  Vitals:   05/30/17 1303  TempSrc: Oral      Patients Stated Pain Goal: 3 (35/24/81 8590)  Complications: No apparent anesthesia complications

## 2017-05-30 NOTE — Anesthesia Procedure Notes (Signed)
Procedure Name: LMA Insertion Date/Time: 05/30/2017 3:24 PM Performed by: Everlean Cherry A Pre-anesthesia Checklist: Patient identified, Emergency Drugs available, Suction available and Patient being monitored Patient Re-evaluated:Patient Re-evaluated prior to inductionOxygen Delivery Method: Circle system utilized Preoxygenation: Pre-oxygenation with 100% oxygen Intubation Type: IV induction Ventilation: Mask ventilation without difficulty LMA: LMA inserted LMA Size: 5.0 Number of attempts: 1 Placement Confirmation: positive ETCO2 and breath sounds checked- equal and bilateral Tube secured with: Tape Dental Injury: Teeth and Oropharynx as per pre-operative assessment

## 2017-05-30 NOTE — Brief Op Note (Signed)
05/30/2017  4:35 PM  PATIENT:  [name]  56 y.o. male  PRE-OPERATIVE DIAGNOSIS:  Left foot gangrene  POST-OPERATIVE DIAGNOSIS:  Left foot gangrene  Procedure(s): Left below knee amputation  SURGEON:  [name], MD  ASSISTANT: Mechele Claude, PA-C  ANESTHESIA:   General, regional  EBL:  minimal   TOURNIQUET:   Total Tourniquet Time Documented: Thigh (Left) - 41 minutes Total: Thigh (Left) - 41 minutes  COMPLICATIONS:  None apparent  DISPOSITION:  Extubated, awake and stable to recovery.  DICTATION ID:  [id number]
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
## 2017-05-31 ENCOUNTER — Encounter (HOSPITAL_COMMUNITY): Payer: Self-pay | Admitting: Orthopedic Surgery

## 2017-05-31 DIAGNOSIS — G8918 Other acute postprocedural pain: Secondary | ICD-10-CM

## 2017-05-31 DIAGNOSIS — N184 Chronic kidney disease, stage 4 (severe): Secondary | ICD-10-CM

## 2017-05-31 DIAGNOSIS — E1122 Type 2 diabetes mellitus with diabetic chronic kidney disease: Secondary | ICD-10-CM

## 2017-05-31 DIAGNOSIS — I10 Essential (primary) hypertension: Secondary | ICD-10-CM

## 2017-05-31 DIAGNOSIS — D638 Anemia in other chronic diseases classified elsewhere: Secondary | ICD-10-CM

## 2017-05-31 DIAGNOSIS — Z89512 Acquired absence of left leg below knee: Secondary | ICD-10-CM

## 2017-05-31 DIAGNOSIS — D62 Acute posthemorrhagic anemia: Secondary | ICD-10-CM

## 2017-05-31 LAB — CBC
HCT: 28.7 % — ABNORMAL LOW (ref 39.0–52.0)
HEMOGLOBIN: 8.5 g/dL — AB (ref 13.0–17.0)
MCH: 21.2 pg — AB (ref 26.0–34.0)
MCHC: 29.6 g/dL — ABNORMAL LOW (ref 30.0–36.0)
MCV: 71.6 fL — ABNORMAL LOW (ref 78.0–100.0)
PLATELETS: 348 10*3/uL (ref 150–400)
RBC: 4.01 MIL/uL — AB (ref 4.22–5.81)
RDW: 15.5 % (ref 11.5–15.5)
WBC: 12.7 10*3/uL — AB (ref 4.0–10.5)

## 2017-05-31 LAB — HEMOGLOBIN A1C
HEMOGLOBIN A1C: 6.9 % — AB (ref 4.8–5.6)
Mean Plasma Glucose: 151 mg/dL

## 2017-05-31 LAB — BASIC METABOLIC PANEL
Anion gap: 7 (ref 5–15)
BUN: 54 mg/dL — AB (ref 6–20)
CO2: 24 mmol/L (ref 22–32)
CREATININE: 4.28 mg/dL — AB (ref 0.61–1.24)
Calcium: 8.2 mg/dL — ABNORMAL LOW (ref 8.9–10.3)
Chloride: 106 mmol/L (ref 101–111)
GFR, EST AFRICAN AMERICAN: 16 mL/min — AB (ref >60)
GFR, EST NON AFRICAN AMERICAN: 14 mL/min — AB (ref >60)
Glucose, Bld: 229 mg/dL — ABNORMAL HIGH (ref 65–99)
POTASSIUM: 5.5 mmol/L — AB (ref 3.5–5.1)
SODIUM: 137 mmol/L (ref 135–145)

## 2017-05-31 LAB — GLUCOSE, CAPILLARY
GLUCOSE-CAPILLARY: 202 mg/dL — AB (ref 65–99)
Glucose-Capillary: 215 mg/dL — ABNORMAL HIGH (ref 65–99)
Glucose-Capillary: 206 mg/dL — ABNORMAL HIGH (ref 65–99)
Glucose-Capillary: 140 mg/dL — ABNORMAL HIGH (ref 65–99)

## 2017-05-31 MED ORDER — SODIUM CHLORIDE 0.9% FLUSH
10.0000 mL | INTRAVENOUS | Status: DC | PRN
  Administered 2017-06-02 – 2017-06-04 (×3): 10 mL
  Filled 2017-05-31 (×3): qty 40

## 2017-05-31 MED ORDER — SODIUM POLYSTYRENE SULFONATE 15 GM/60ML PO SUSP
30.0000 g | Freq: Once | ORAL | Status: AC
  Administered 2017-05-31: 30 g via ORAL
  Filled 2017-05-31: qty 120

## 2017-05-31 MED ORDER — GABAPENTIN 300 MG PO CAPS
300.0000 mg | ORAL_CAPSULE | Freq: Every day | ORAL | Status: DC
  Administered 2017-06-01 – 2017-06-03 (×3): 300 mg via ORAL
  Filled 2017-05-31 (×4): qty 1
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
## 2017-05-31 NOTE — Progress Notes (Signed)
[facility]  Patient Status: Active (receiving services up to time of hospitalization)  [facility] is providing the following services: RN  If patient discharges after hours, please call [phone].   [name] 05/31/2017, 9:52 AM

## 2017-05-31 NOTE — Op Note (Signed)
NAMECLAYBORN, MILNES              ACCOUNT NO.:  [id number]  MEDICAL RECORD NO.:  [id number]  LOCATION:                                 FACILITY:  PHYSICIAN:  [name], MD        DATE OF BIRTH:  [birth year]  DATE OF PROCEDURE:  05/30/2017 DATE OF DISCHARGE:                              OPERATIVE REPORT   PREOPERATIVE DIAGNOSIS:  Left foot gangrene.  POSTOPERATIVE DIAGNOSIS:  Left foot gangrene.  PROCEDURE:  Left below-knee amputation.  SURGEON:  [name], MD  ASSISTANT:  Mechele Claude, PA-C  ANESTHESIA:  General, regional.  ESTIMATED BLOOD LOSS:  Minimal.  TOURNIQUET TIME:  41 minutes at 250 mmHg.  COMPLICATIONS:  None apparent.  DISPOSITION:  Extubated, awake and stable to recovery.  INDICATIONS FOR PROCEDURE:  The patient is a 56 year old male with past medical history significant for diabetes complicated by peripheral neuropathy.  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  He underwent left foot transmetatarsal amputation in April of this year.  He failed to heal that wound and had progressive breakdown and progression to gangrene with exposed metatarsals.  Debridement of the foot wound would not allow adequate foot for ambulation even if the wound were to heal.  As a result, the patient presents today for left below-knee amputation.  He understands the risks and benefits of the alternative treatment options and elects surgical treatment.  He specifically understands risks of bleeding, infection, nerve damage, blood clots, need for additional surgery, continued pain, revision amputation, and death.  PROCEDURE IN DETAIL:  After preoperative consent was obtained and the correct operative site was identified, the patient was brought to the operating room and placed supine on the operating table.  General anesthesia was induced.  Preoperative antibiotics were administered. Surgical time-out was taken.  Left lower extremity was prepped  and draped in a standard sterile fashion with a tourniquet around the thigh. Tourniquet was inflated to 250 mmHg.  A posterior flap incision was marked on the skin.  The incision was made and dissection was carried down through skin and subcutaneous tissues to the level of the anterior tibia.  The periosteum was elevated.  The soft tissues were divided sharply.  The tibia was then cut with the reciprocating saw, taking care to protect the surrounding soft tissues.  The fibula was then dissected free and was cut approximately 2 cm proximal from the tibial cut.  The leg was then retracted anteriorly and the posterior soft tissues were cut with an amputation knife contouring the posterior flap appropriately.  The leg was passed off the field.  The named nerves were all sharply divided while gently placed in traction.  The vascular bundles were all doubly suture ligated with 2-0 silk ties.  The cut surface of the tibia was contoured appropriately and smoothed.  The wound was then irrigated copiously.  The gastrocnemius was repaired to the anterior tibial periosteum with imbricating sutures of 0 PDS. Vancomycin powder was then sprinkled in the wound.  The superficial subcutaneous tissues were approximated with inverted simple sutures of 3- 0 Monocryl.  The skin incision was closed with horizontal mattress sutures of 3-0 nylon.  Sterile dressings were applied followed by compression wrap.  Tourniquet was released after application of the dressings at 41 minutes.  The patient was awakened from anesthesia and transported to the recovery room in stable condition.  FOLLOWUP PLAN:  The patient will be nonweightbearing on the left lower extremity.  He will have physical therapy and occupational therapy consultations.  He will start DVT prophylaxis tomorrow with Lovenox.  Mechele Claude, PA-C, was present and scrubbed for the duration of the case.  His assistance was essential in positioning the  patient, prepping and draping, gaining and maintaining exposure, performing the operation, closing and dressing the wounds and applying the knee immobilizer.     [name], MD   ______________________________ [name], MD    JH/MEDQ  D:  05/30/2017  T:  05/31/2017  Job:  [id number]

## 2017-05-31 NOTE — Progress Notes (Signed)
Orthopedic Tech Progress Note Patient Details:  [name] March 10, 1961 [id number]  Patient ID: [name], male   DOB: 03-31-1961, 56 y.o.   MRN: [id number]   [name] 05/31/2017, 10:08 AM Called in bio-tech brace order; spoke with Bella Kennedy
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
## 2017-05-31 NOTE — Op Note (Deleted)
  The note originally documented on this encounter has been moved the the encounter in which it belongs.  

## 2017-05-31 NOTE — Progress Notes (Signed)
[facility] PROGRESS NOTE  [name] GQB:[id number] DOB: 12-Sep-1961 DOA: 05/30/2017   PCP: System, Pcp Not In     Assessment/Plan: Active Problems:   Anemia of chronic disease   CKD (chronic kidney disease) stage 4, GFR 15-29 ml/min (HCC)   Essential hypertension   S/P BKA (below knee amputation), left (HCC)   DM type 2 causing CKD stage 4 ([place])    56 y.o. male  with a past medical history of chronic kidney disease stage IV, type 2 diabetes on insulin, hypertension, anemia of chronic disease, recently diagnosed osteomyelitis and abscess involving the left lower extremity who has been on long-term IV antibiotics which completed about 2 weeks ago. Patient's infection was not improving and he developed left foot gangrene. This required a left below-knee amputation which he underwent today. Hospitalist was consulted for management of diabetes. He is also followed by nephrology, Dr. Florene Glen for his chronic kidney disease. Not yet on dialysis  Assessment and plan  #1 Diabetes mellitus type 2 with chronic kidney disease stage IV: Continue Accu-Cheks, continue NovoLog 5 units 3 times a day, Lantus 12 units twice a day,  . He takes 70/30 insulin at home. Since he will be in the hospital he'll be placed on Lantus. Accu-Cheks stable overnight   Last A1c was 6.9 on April 28.  #2 chronic kidney disease stage IV: Creatinine was 5.39 back in May. It is 4.28 this morning. Continue to monitor urine output. Monitor renal function closely. Followed by Dr. Florene Glen with nephrology as an outpatient. Has a fistula in his left forearm which is being followed by Dr. Oneida Alar. Decrease dose of Neurontin to 300 mg daily at bedtime to avoid toxicity  #3 Essential hypertension. Monitor closely. Resume his home medication regimen.  #4 History of left lower extremity osteomyelitis and abscess, now status post left BKA: He also recently completed a course of IV antibiotics about 2 weeks ago. He still  has PICC line in his right chest. Continue for now, but this will need to be removed prior to discharge from the hospital.  #5 Anemia of chronic disease: Hemoglobin is 9.5 >8.5 today.    #6 hyperkalemia-will give 1 dose of Kayexalate   DVT prophylaxsis  as per orthopedics  Code Status:  Full code    Family Communication: Discussed in detail with the patient, all imaging results, lab results explained to the patient   Disposition Plan: As per orthopedics      Consultants:  Orthopedics  Procedures:  None  Antibiotics: Anti-infectives    Start     Dose/Rate Route Frequency Ordered Stop   05/31/17 0600  ceFAZolin (ANCEF) IVPB 2g/100 mL premix     2 g 200 mL/hr over 30 Minutes Intravenous On call to O.R. 05/30/17 1235 05/30/17 1556   05/30/17 1900  ceFAZolin (ANCEF) IVPB 1 g/50 mL premix     1 g 100 mL/hr over 30 Minutes Intravenous Every 6 hours 05/30/17 1858 05/31/17 1259   05/30/17 1620  vancomycin (VANCOCIN) powder  Status:  Discontinued       As needed 05/30/17 1620 05/30/17 1632   05/30/17 1237  ceFAZolin (ANCEF) 2-4 GM/100ML-% IVPB    Comments:  [name]   : cabinet override      05/30/17 1237 05/30/17 1526         HPI/Subjective: Feels well , accuchecks stab;e, some pain in his stump  Objective: Vitals:   05/30/17 1815 05/30/17 2128 05/31/17 0206 05/31/17 8280  BP:  (!) 157/71 (!) 159/79 (!) 157/77  Pulse: 75 78 86 81  Resp: 10 16 16 16   Temp: 98.1 F (36.7 C) 97.4 F (36.3 C) 98.8 F (37.1 C) 98.6 F (37 C)  TempSrc:  Oral Oral Oral  SpO2: 100% 100% 99% 98%  Weight:        Intake/Output Summary (Last 24 hours) at 05/31/17 0925 Last data filed at 05/31/17 0700  Gross per 24 hour  Intake             1150 ml  Output             1590 ml  Net             -440 ml    Exam:  Examination:  General exam: Appears calm and comfortable  Respiratory system: Clear to auscultation. Respiratory effort normal. Cardiovascular system: S1 & S2  heard, RRR. No JVD, murmurs, rubs, gallops or clicks. No pedal edema. Gastrointestinal system: Abdomen is nondistended, soft and nontender. No organomegaly or masses felt. Normal bowel sounds heard. Central nervous system: Alert and oriented. No focal neurological deficits. Extremities: Left lower extremity covered in a dressing. He is also status post transmetatarsal amputation of the right foot Skin: No rashes, lesions or ulcers Psychiatry: Judgement and insight appear normal. Mood & affect appropriate.     Data Reviewed: I have personally reviewed following labs and imaging studies  Micro Results No results found for this or any previous visit (from the past 240 hour(s)).  Radiology Reports No results found.   CBC  Recent Labs Lab 05/30/17 1248 05/31/17 0431  WBC 10.9* 12.7*  HGB 9.5* 8.5*  HCT 31.4* 28.7*  PLT 368 348  MCV 72.2* 71.6*  MCH 21.8* 21.2*  MCHC 30.3 29.6*  RDW 15.7* 15.5    Chemistries   Recent Labs Lab 05/30/17 1248 05/31/17 0431  NA 135 137  K 4.6 5.5*  CL 106 106  CO2 19* 24  GLUCOSE 165* 229*  BUN 49* 54*  CREATININE 3.85* 4.28*  CALCIUM 8.9 8.2*   ------------------------------------------------------------------------------------------------------------------ estimated creatinine clearance is 23.1 mL/min (A) (by C-G formula based on SCr of 4.28 mg/dL (H)). ------------------------------------------------------------------------------------------------------------------  Recent Labs  05/30/17 1248  HGBA1C 6.9*   ------------------------------------------------------------------------------------------------------------------ No results for input(s): CHOL, HDL, LDLCALC, TRIG, CHOLHDL, LDLDIRECT in the last 72 hours. ------------------------------------------------------------------------------------------------------------------ No results for input(s): TSH, T4TOTAL, T3FREE, THYROIDAB in the last 72 hours.  Invalid input(s):  FREET3 ------------------------------------------------------------------------------------------------------------------ No results for input(s): VITAMINB12, FOLATE, FERRITIN, TIBC, IRON, RETICCTPCT in the last 72 hours.  Coagulation profile No results for input(s): INR, PROTIME in the last 168 hours.  No results for input(s): DDIMER in the last 72 hours.  Cardiac Enzymes No results for input(s): CKMB, TROPONINI, MYOGLOBIN in the last 168 hours.  Invalid input(s): CK ------------------------------------------------------------------------------------------------------------------ Invalid input(s): POCBNP   CBG:  Recent Labs Lab 05/30/17 1302 05/30/17 1659 05/30/17 2126 05/31/17 0703  GLUCAP 173* 181* 191* 215*       Studies: No results found.    Lab Results  Component Value Date   HGBA1C 6.9 (H) 05/30/2017   HGBA1C 7.0 (H) 03/31/2017   HGBA1C 7.2 (H) 12/19/2013   Lab Results  Component Value Date   CREATININE 4.28 (H) 05/31/2017       Scheduled Meds: . amLODipine  10 mg Oral QHS  . atorvastatin  10 mg Oral QHS  . carvedilol  25 mg Oral BID WC  . docusate sodium  100 mg Oral BID  .  enoxaparin (LOVENOX) injection  30 mg Subcutaneous Q24H  . [START ON 06/01/2017] gabapentin  300 mg Oral QHS  . insulin aspart  0-15 Units Subcutaneous TID WC  . insulin glargine  12 Units Subcutaneous BID  . senna  1 tablet Oral BID  . sodium bicarbonate  650 mg Oral BID   Continuous Infusions: . sodium chloride 75 mL/hr at 05/30/17 2257  .  ceFAZolin (ANCEF) IV 1 g (05/31/17 0444)     LOS: 1 day    Time spent: >30 MINS    [name]  [facility] Pager [phone]. If 7PM-7AM, please contact night-coverage at [email or web address], password [facility] 05/31/2017, 9:25 AM  LOS: 1 day

## 2017-05-31 NOTE — Progress Notes (Signed)
Rehab Admissions Coordinator Note:  Patient was screened by Cleatrice Burke for appropriateness for an Inpatient Acute Rehab Consult per PT and OT recommendation.  At this time, we are recommending Inpatient Rehab consult. [facility] order fore consult.  Cleatrice Burke 05/31/2017, 1:20 PM  I can be reached at [phone].

## 2017-05-31 NOTE — Progress Notes (Signed)
Subjective: 1 Day Post-Op Procedure(s) (LRB): AMPUTATION BELOW KNEE (Left) Patient reports pain as moderate.  Pt in better spirits today.  Up with PT.    Objective: Vital signs in last 24 hours: Temp:  [97.4 F (36.3 C)-98.8 F (37.1 C)] 98 F (36.7 C) (06/28 1512) Pulse Rate:  [75-86] 77 (06/28 1512) Resp:  [7-28] 17 (06/28 1512) BP: (147-178)/(71-95) 152/77 (06/28 1512) SpO2:  [98 %-100 %] 100 % (06/28 1512)  Intake/Output from previous day: 06/27 0701 - 06/28 0700 In: 1150 [P.O.:600; I.V.:500; IV Piggyback:50] Out: 1590 [RAJHH:8343; Blood:15] Intake/Output this shift: Total I/O In: 360 [P.O.:360] Out: 500 [Urine:500]   Recent Labs  05/30/17 1248 05/31/17 0431  HGB 9.5* 8.5*    Recent Labs  05/30/17 1248 05/31/17 0431  WBC 10.9* 12.7*  RBC 4.35 4.01*  HCT 31.4* 28.7*  PLT 368 348    Recent Labs  05/30/17 1248 05/31/17 0431  NA 135 137  K 4.6 5.5*  CL 106 106  CO2 19* 24  BUN 49* 54*  CREATININE 3.85* 4.28*  GLUCOSE 165* 229*  CALCIUM 8.9 8.2*   No results for input(s): LABPT, INR in the last 72 hours.  PE:  wn wd male in nad.  L LE immobililized in stump protector.  Dressing is dry.  Assessment/Plan: 1 Day Post-Op Procedure(s) (LRB): AMPUTATION BELOW KNEE (Left) PT and OT recommend CIR consult for placement for rehab post op.  Awaiting results of consultation.  Continue IV hydration and monitor renal function.  Appreciate hospitalist consultant mgmt of this complicated patient.  Continue PT and OT.  Pt understands the plan and agrees.  [name] 05/31/2017, 3:57 PM

## 2017-05-31 NOTE — Progress Notes (Addendum)
Subjective: 1 Day Post-Op Procedure(s) (LRB): AMPUTATION BELOW KNEE (Left)  Patient reports pain as mild to moderate.  Has not eaten since surgery, but reports that he is ready for breakfast.  Denies fever, chills, N/V, SOB, CP.  Objective:   VITALS:  Temp:  [97.4 F (36.3 C)-98.8 F (37.1 C)] 98.6 F (37 C) (06/28 0646) Pulse Rate:  [72-86] 81 (06/28 0646) Resp:  [7-28] 16 (06/28 0646) BP: (147-198)/(71-95) 157/77 (06/28 0646) SpO2:  [98 %-100 %] 98 % (06/28 0646) Weight:  [98 kg (216 lb)] 98 kg (216 lb) (06/27 1303)  General: WDWN patient in NAD. Psych:  Appropriate mood and affect. Neuro:  A&O x 3, Moving all extremities, sensation intact to light touch HEENT:  EOMs intact Chest:  Even non-labored respirations Skin:  Dressing C/D/I, no rashes or lesions.  Knee immobolizer intact. Extremities: warm/dry, no visible edema, erythema, or echymosis.  No lymphadenopathy. Pulses: Femoral 2+ MSK:  ROM: TKE, MMT: patient is able to perform quad set    LABS  Recent Labs  05/30/17 1248 05/31/17 0431  HGB 9.5* 8.5*  WBC 10.9* 12.7*  PLT 368 348    Recent Labs  05/30/17 1248 05/31/17 0431  NA 135 137  K 4.6 5.5*  CL 106 106  CO2 19* 24  BUN 49* 54*  CREATININE 3.85* 4.28*  GLUCOSE 165* 229*   No results for input(s): LABPT, INR in the last 72 hours.   Assessment/Plan: 1 Day Post-Op Procedure(s) (LRB): AMPUTATION BELOW KNEE (Left)  NWB [name] Up with therapy Keep immobolizer on until patient receives stump protector.  Stump protector should remain on at all times once obtained. Defer to Medicine team for other advanced medical problems.  Appreciate Medicine team's assistance. Noted that PICC line needs to be removed prior to D/C. Lovenox for DVT prophylaxis.  Mechele Claude, PA-C [facility] Office:  [phone]
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
## 2017-06-01 DIAGNOSIS — I10 Essential (primary) hypertension: Secondary | ICD-10-CM

## 2017-06-01 DIAGNOSIS — G8918 Other acute postprocedural pain: Secondary | ICD-10-CM

## 2017-06-01 DIAGNOSIS — Z89511 Acquired absence of right leg below knee: Secondary | ICD-10-CM

## 2017-06-01 DIAGNOSIS — D62 Acute posthemorrhagic anemia: Secondary | ICD-10-CM

## 2017-06-01 DIAGNOSIS — Z89512 Acquired absence of left leg below knee: Secondary | ICD-10-CM

## 2017-06-01 LAB — CREATININE, SERUM
CREATININE: 4 mg/dL — AB (ref 0.61–1.24)
GFR calc Af Amer: 18 mL/min — ABNORMAL LOW (ref >60)
GFR calc non Af Amer: 15 mL/min — ABNORMAL LOW (ref >60)

## 2017-06-01 LAB — GLUCOSE, CAPILLARY
GLUCOSE-CAPILLARY: 151 mg/dL — AB (ref 65–99)
Glucose-Capillary: 172 mg/dL — ABNORMAL HIGH (ref 65–99)
Glucose-Capillary: 155 mg/dL — ABNORMAL HIGH (ref 65–99)
Glucose-Capillary: 172 mg/dL — ABNORMAL HIGH (ref 65–99)

## 2017-06-01 MED ORDER — ASPIRIN EC 81 MG PO TBEC: 81.0000 mg | DELAYED_RELEASE_TABLET | Freq: Two times a day (BID) | ORAL | 0 refills | Status: DC

## 2017-06-01 MED ORDER — SODIUM POLYSTYRENE SULFONATE 15 GM/60ML PO SUSP
15.0000 g | ORAL | Status: DC | PRN
  Filled 2017-06-01: qty 60

## 2017-06-01 MED ORDER — OXYCODONE HCL 5 MG PO TABS: 5.0000 mg | ORAL_TABLET | ORAL | 0 refills | Status: DC | PRN

## 2017-06-01 MED ORDER — DOCUSATE SODIUM 100 MG PO CAPS: 100.0000 mg | ORAL_CAPSULE | Freq: Two times a day (BID) | ORAL | 0 refills | Status: DC

## 2017-06-01 MED ORDER — SENNA 8.6 MG PO TABS: 2.0000 | ORAL_TABLET | Freq: Two times a day (BID) | ORAL | 0 refills | Status: DC

## 2017-06-01 NOTE — Progress Notes (Signed)
[facility] PROGRESS NOTE  [name] OHY:[id number] DOB: 09-03-1961 DOA: 05/30/2017   PCP: System, Pcp Not In     Assessment/Plan: Active Problems:   Anemia of chronic disease   CKD (chronic kidney disease) stage 4, GFR 15-29 ml/min (HCC)   Essential hypertension   S/P BKA (below knee amputation), left (HCC)   DM type 2 causing CKD stage 4 ([place])    56 y.o. male  with a past medical history of chronic kidney disease stage IV, type 2 diabetes on insulin, hypertension, anemia of chronic disease, recently diagnosed osteomyelitis and abscess involving the left lower extremity who has been on long-term IV antibiotics which completed about 2 weeks ago. Patient's infection was not improving and he developed left foot gangrene. This required a left below-knee amputation which he underwent today. Hospitalist was consulted for management of diabetes. He is also followed by nephrology, Dr. Florene Glen for his chronic kidney disease. Not yet on dialysis  Assessment and plan  #1 Diabetes mellitus type 2 with chronic kidney disease stage IV: Continue Accu-Cheks, patient is on Humulin 70/30 at home. Currently on  NovoLog 5 units 3 times a day, Lantus 12 units twice a day, increase Lantus to 13 units twice a day due to slightly elevated CBGs .  Transition to home regimen prior to discharge from CIR.     Last A1c was 6.9 on April 28.  #2 chronic kidney disease stage IV: Creatinine was 5.39 back in May. It is 4.28 >4.00 this morning. Excellent urine output. Patient made about  2850 cc in the last 24 hours .Continue to monitor urine output. Monitor renal function closely. Followed by Dr. Florene Glen with nephrology as an outpatient. Has a fistula in his left forearm which is being followed by Dr. Oneida Alar. Decrease dose of Neurontin to 300 mg daily at bedtime to avoid toxicity , based on GFR of 80   #3 Essential hypertension. Monitor closely. Resume his home medication regimen.  #4 History of  left lower extremity osteomyelitis and abscess, now status post left BKA: He also recently completed a course of IV antibiotics about 2 weeks ago.   #5 Anemia of chronic disease: Hemoglobin is 9.5 >8.5 today.    #6 hyperkalemia-continue Kayexalate as needed for potassium greater than 5.5   DVT prophylaxsis  as per orthopedics  Code Status:  Full code    Family Communication: Discussed in detail with the patient, all imaging results, lab results explained to the patient   Disposition Plan: As per orthopedics/likely CIR/ TRH  will sign off as patient is medically stable      Consultants:  Orthopedics  Procedures:  None  Antibiotics: Anti-infectives    Start     Dose/Rate Route Frequency Ordered Stop   05/31/17 0600  ceFAZolin (ANCEF) IVPB 2g/100 mL premix     2 g 200 mL/hr over 30 Minutes Intravenous On call to O.R. 05/30/17 1235 05/30/17 1556   05/30/17 1900  ceFAZolin (ANCEF) IVPB 1 g/50 mL premix  Status:  Discontinued     1 g 100 mL/hr over 30 Minutes Intravenous Every 6 hours 05/30/17 1858 05/31/17 1259   05/30/17 1620  vancomycin (VANCOCIN) powder  Status:  Discontinued       As needed 05/30/17 1620 05/30/17 1632   05/30/17 1237  ceFAZolin (ANCEF) 2-4 GM/100ML-% IVPB    Comments:  Laurita Quint   : cabinet override      05/30/17 1237 05/30/17 1526  HPI/Subjective: Feels well , accuchecks stable, some pain in his stump  Objective: Vitals:   05/31/17 0646 05/31/17 1512 05/31/17 2300 06/01/17 0500  BP: (!) 157/77 (!) 152/77 (!) 144/70   Pulse: 81 77 73 82  Resp: 16 17 16 17   Temp: 98.6 F (37 C) 98 F (36.7 C) 98.5 F (36.9 C) 98.2 F (36.8 C)  TempSrc: Oral Oral  Oral  SpO2: 98% 100% 96% 96%  Weight:        Intake/Output Summary (Last 24 hours) at 06/01/17 0840 Last data filed at 06/01/17 0736  Gross per 24 hour  Intake          2658.75 ml  Output             2700 ml  Net           -41.25 ml    Exam:  Examination:  General exam:  Appears calm and comfortable  Respiratory system: Clear to auscultation. Respiratory effort normal. Cardiovascular system: S1 & S2 heard, RRR. No JVD, murmurs, rubs, gallops or clicks. No pedal edema. Gastrointestinal system: Abdomen is nondistended, soft and nontender. No organomegaly or masses felt. Normal bowel sounds heard. Central nervous system: Alert and oriented. No focal neurological deficits. Extremities: Left lower extremity covered in stump protector . He is also status post transmetatarsal amputation of the right foot Skin: No rashes, lesions or ulcers Psychiatry: Judgement and insight appear normal. Mood & affect appropriate.     Data Reviewed: I have personally reviewed following labs and imaging studies  Micro Results No results found for this or any previous visit (from the past 240 hour(s)).  Radiology Reports No results found.   CBC  Recent Labs Lab 05/30/17 1248 05/31/17 0431  WBC 10.9* 12.7*  HGB 9.5* 8.5*  HCT 31.4* 28.7*  PLT 368 348  MCV 72.2* 71.6*  MCH 21.8* 21.2*  MCHC 30.3 29.6*  RDW 15.7* 15.5    Chemistries   Recent Labs Lab 05/30/17 1248 05/31/17 0431 06/01/17 0420  NA 135 137  --   K 4.6 5.5*  --   CL 106 106  --   CO2 19* 24  --   GLUCOSE 165* 229*  --   BUN 49* 54*  --   CREATININE 3.85* 4.28* 4.00*  CALCIUM 8.9 8.2*  --    ------------------------------------------------------------------------------------------------------------------ estimated creatinine clearance is 24.7 mL/min (A) (by C-G formula based on SCr of 4 mg/dL (H)). ------------------------------------------------------------------------------------------------------------------  Recent Labs  05/30/17 1248  HGBA1C 6.9*   ------------------------------------------------------------------------------------------------------------------ No results for input(s): CHOL, HDL, LDLCALC, TRIG, CHOLHDL, LDLDIRECT in the last 72  hours. ------------------------------------------------------------------------------------------------------------------ No results for input(s): TSH, T4TOTAL, T3FREE, THYROIDAB in the last 72 hours.  Invalid input(s): FREET3 ------------------------------------------------------------------------------------------------------------------ No results for input(s): VITAMINB12, FOLATE, FERRITIN, TIBC, IRON, RETICCTPCT in the last 72 hours.  Coagulation profile No results for input(s): INR, PROTIME in the last 168 hours.  No results for input(s): DDIMER in the last 72 hours.  Cardiac Enzymes No results for input(s): CKMB, TROPONINI, MYOGLOBIN in the last 168 hours.  Invalid input(s): CK ------------------------------------------------------------------------------------------------------------------ Invalid input(s): POCBNP   CBG:  Recent Labs Lab 05/31/17 0703 05/31/17 1226 05/31/17 1642 05/31/17 2210 06/01/17 0656  GLUCAP 215* 202* 206* 140* 172*       Studies: No results found.    Lab Results  Component Value Date   HGBA1C 6.9 (H) 05/30/2017   HGBA1C 7.0 (H) 03/31/2017   HGBA1C 7.2 (H) 12/19/2013   Lab Results  Component Value Date  CREATININE 4.00 (H) 06/01/2017       Scheduled Meds: . amLODipine  10 mg Oral QHS  . atorvastatin  10 mg Oral QHS  . carvedilol  25 mg Oral BID WC  . docusate sodium  100 mg Oral BID  . enoxaparin (LOVENOX) injection  30 mg Subcutaneous Q24H  . gabapentin  300 mg Oral QHS  . insulin aspart  0-15 Units Subcutaneous TID WC  . insulin glargine  12 Units Subcutaneous BID  . senna  1 tablet Oral BID  . sodium bicarbonate  650 mg Oral BID   Continuous Infusions: . sodium chloride 75 mL/hr at 06/01/17 0246     LOS: 2 days    Time spent: >30 MINS    Reyne Dumas  [facility] Pager [phone]. If 7PM-7AM, please contact night-coverage at [email or web address], password [facility] 06/01/2017, 8:40 AM  LOS: 2 days

## 2017-06-01 NOTE — Progress Notes (Signed)
Subjective: 2 Days Post-Op Procedure(s) (LRB): AMPUTATION BELOW KNEE (Left)  Patient reports pain as mild to moderate.  Tolerating POs well.  Admits to flatulence.  Denies fever, chills, N/V, SOB, CP.  Reports that he worked well with therapy. Objective:   VITALS:  Temp:  [98 F (36.7 C)-98.5 F (36.9 C)] 98.2 F (36.8 C) (06/29 0500) Pulse Rate:  [73-82] 82 (06/29 0500) Resp:  [16-17] 17 (06/29 0500) BP: (144-152)/(70-77) 144/70 (06/28 2300) SpO2:  [96 %-100 %] 96 % (06/29 0500)  General: WDWN patient in NAD. Psych:  Appropriate mood and affect. Neuro:  A&O x 3, Moving all extremities, sensation intact to light touch HEENT:  EOMs intact Chest:  Even non-labored respirations Skin:  Dressing C/D/I, no rashes or lesions.  Stump protector on and intact Extremities: warm/dry, no visible edema, erythema, or echymosis.  No lymphadenopathy. Pulses: femoral 2+ MSK:  ROM: TKE, MMT: patient is able to perform quad set    LABS  Recent Labs  05/30/17 1248 05/31/17 0431  HGB 9.5* 8.5*  WBC 10.9* 12.7*  PLT 368 348    Recent Labs  05/30/17 1248 05/31/17 0431 06/01/17 0420  NA 135 137  --   K 4.6 5.5*  --   CL 106 106  --   CO2 19* 24  --   BUN 49* 54*  --   CREATININE 3.85* 4.28* 4.00*  GLUCOSE 165* 229*  --    No results for input(s): LABPT, INR in the last 72 hours.   Assessment/Plan: 2 Days Post-Op Procedure(s) (LRB): AMPUTATION BELOW KNEE (Left)  NWB [name] Up with therapy Stump protector on at all times. Disposition pending, PT/OT recommend CIR. Scripts on chart Plan for 2 week outpatient post-op visit with Dr. Doran Durand Other medical issues being addressed by Medicine team.  Mechele Claude, [facility] Office:  [phone]

## 2017-06-01 NOTE — Progress Notes (Addendum)
Pt appropriate for admit to CIR, but no bed available today or through the weekend for this pt. I have alerted RN CM. I will follow up Monday if pt remains in house. I have not begun authorization with insurance. [phone].

## 2017-06-01 NOTE — NC FL2 (Signed)
[place] LEVEL OF CARE SCREENING TOOL     IDENTIFICATION  Patient Name: [name] Birthdate: 1961/05/30 Sex: male Admission Date (Current Location): 05/30/2017  [facility] and [place] Number:  Herbalist and Address:  [facility]. [facility], [place] [address], [place],  [address]      Provider Number: [id number]  Attending Physician Name and Address:  [name], MD  Relative Name and Phone Number:       Current Level of Care: Hospital Recommended Level of Care: [place] Prior Approval Number:    Date Approved/Denied: 06/01/17 PASRR Number: [id number]  Discharge Plan: SNF    Current Diagnoses: Patient Active Problem List   Diagnosis Date Noted  . S/P unilateral BKA (below knee amputation), left ([place])   . Benign essential HTN   . Post-operative pain   . Acute blood loss anemia   . S/P BKA (below knee amputation), left ([place]) 05/30/2017  . DM type 2 causing CKD stage 4 ([place]) 05/30/2017  . Diabetic infection of left foot ([place])   . Left foot infection 03/30/2017  . AKI (acute kidney injury) ([place]) 03/30/2017  . Hyponatremia 03/30/2017  . Essential hypertension 03/30/2017  . Necrosis of toe ([place]) 12/18/2013  . Osteomyelitis ([place]) 12/18/2013  . Leukocytosis 12/18/2013  . Anemia of chronic disease 12/18/2013  . CKD (chronic kidney disease) stage 4, GFR 15-29 ml/min (HCC) 12/18/2013    Orientation RESPIRATION BLADDER Height & Weight     Self, Time, Situation, Place  Normal Continent Weight: 216 lb (98 kg) Height:     BEHAVIORAL SYMPTOMS/MOOD NEUROLOGICAL BOWEL NUTRITION STATUS      Continent Diet (See DC Summary)  AMBULATORY STATUS COMMUNICATION OF NEEDS Skin   Limited Assist Verbally Surgical wounds (Closed Left Leg Incision, Gauze)                       Personal Care Assistance Level of Assistance  Bathing, Feeding, Dressing Bathing Assistance: Limited assistance Feeding assistance:  Independent Dressing Assistance: Limited assistance     Functional Limitations Info  Hearing, Speech, Sight Sight Info: Adequate Hearing Info: Adequate Speech Info: Adequate    SPECIAL CARE FACTORS FREQUENCY  PT (By licensed PT), OT (By licensed OT)     PT Frequency: 5xweek OT Frequency: 5xweek            Contractures      Additional Factors Info  Code Status, Allergies Code Status Info: Full Code Allergies Info: NKA           Current Medications (06/01/2017):  This is the current hospital active medication list Current Facility-Administered Medications  Medication Dose Route Frequency Provider Last Rate Last Dose  . 0.9 %  sodium chloride infusion   Intravenous Continuous [name], [place] 75 mL/hr at 06/01/17 0246    . acetaminophen (TYLENOL) tablet 650 mg  650 mg Oral Q6H PRN [name], PA-C       Or  . acetaminophen (TYLENOL) suppository 650 mg  650 mg Rectal Q6H PRN [name], PA-C      . amLODipine (NORVASC) tablet 10 mg  10 mg Oral QHS [name], PA-C   10 mg at 05/31/17 2306  . atorvastatin (LIPITOR) tablet 10 mg  10 mg Oral QHS [name], PA-C   10 mg at 05/31/17 2255  . carvedilol (COREG) tablet 25 mg  25 mg Oral BID WC [name], PA-C  25 mg at 06/01/17 0850  . diphenhydrAMINE (BENADRYL) 12.5 MG/5ML elixir 12.5-25 mg  12.5-25 mg Oral Q4H PRN [name], PA-C      . docusate sodium (COLACE) capsule 100 mg  100 mg Oral BID [name], PA-C   100 mg at 06/01/17 1050  . enoxaparin (LOVENOX) injection 30 mg  30 mg Subcutaneous Q24H [name], PA-C   30 mg at 06/01/17 0849  . gabapentin (NEURONTIN) capsule 300 mg  300 mg Oral QHS Abrol, Nayana, MD      . HYDROmorphone (DILAUDID) injection 1 mg  1 mg Intravenous Q2H PRN [name], PA-C   1 mg at 06/01/17 1505  . insulin aspart (novoLOG) injection 0-15 Units  0-15 Units Subcutaneous TID WC [name], PA-C   3 Units at  06/01/17 1200  . insulin glargine (LANTUS) injection 12 Units  12 Units Subcutaneous BID Bonnielee Haff, MD   12 Units at 06/01/17 1050  . ondansetron (ZOFRAN) tablet 4 mg  4 mg Oral Q6H PRN [name], PA-C       Or  . ondansetron [facility]) injection 4 mg  4 mg Intravenous Q6H PRN [name], PA-C      . oxyCODONE (Oxy IR/ROXICODONE) immediate release tablet 5-10 mg  5-10 mg Oral Q3H PRN [name], PA-C   10 mg at 06/01/17 1505  . senna (SENOKOT) tablet 8.6 mg  1 tablet Oral BID [name], PA-C   8.6 mg at 06/01/17 1050  . sodium bicarbonate tablet 650 mg  650 mg Oral BID [name], PA-C   650 mg at 06/01/17 1050  . sodium chloride flush (NS) 0.9 % injection 10-40 mL  10-40 mL Intracatheter PRN [name], MD      . sodium polystyrene (KAYEXALATE) 15 GM/60ML suspension 15 g  15 g Oral Q48H PRN Reyne Dumas, MD         Discharge Medications: Please see discharge summary for a list of discharge medications.  Relevant Imaging Results:  Relevant Lab Results:   Additional Information SS: [id number] [place], LCSW

## 2017-06-01 NOTE — Clinical Social Work Note (Signed)
Clinical Social Work Assessment  Patient Details  Name: [name] MRN: [id number] Date of Birth: [birth year]  Date of referral:  06/01/17               Reason for consult:  Facility Placement                Permission sought to share information with:  Facility Art therapist granted to share information::  Yes, Verbal Permission Granted  Name::     spouse  Agency::  SNF  Relationship::     Contact Information:     Housing/Transportation Living arrangements for the past 2 months:  Single Family Home Source of Information:  Spouse Patient Interpreter Needed:  None Criminal Activity/Legal Involvement Pertinent to Current Situation/Hospitalization:  No - Comment as needed Significant Relationships:  Spouse Lives with:  Spouse Do you feel safe going back to the place where you live?  No Need for family participation in patient care:  Yes (Comment)  Care giving concerns:  Patient resides with spouse and received assistance with some ADL's at home with spouse. Pt advised that spouse had health issues and is unable to care for him with impairment. Pt will need rehabilitation. CIR indicated that they do not have a bed for pt.  Social Worker assessment / plan:  CSW met with patient at bedside to discuss DC recommendations to SNF. CSW explained her role and SNF options and placement. CSW obtained permission to send out offers to local area for placement. FL2 complete. Passr obtained. Offers sent.  Employment status:  Disabled (Comment on whether or not currently receiving Disability) Insurance information:  Other (Comment Required) Loss adjuster, chartered) PT Recommendations:  [place] / Referral to community resources:  [place]  Patient/Family's Response to care:  Patient appreciative of CSW assistance. No issues or concerns at this time.  Patient/Family's Understanding of and Emotional Response to Diagnosis, Current Treatment,  and Prognosis:  Pt has good understanding of diagnosis, current treatment, prognosis and is hopeful rehabilitation will assist with impairment. No issues or concerns at this time.  Emotional Assessment Appearance:  Appears stated age Attitude/Demeanor/Rapport:   (Cooperative) Affect (typically observed):  Accepting, Appropriate Orientation:  Oriented to Self, Oriented to Place, Oriented to  Time, Oriented to Situation Alcohol / Substance use:  Not Applicable Psych involvement (Current and /or in the community):  No (Comment)  Discharge Needs  Concerns to be addressed:  Care Coordination Readmission within the last 30 days:  No Current discharge risk:  Physical Impairment, Dependent with Mobility Barriers to Discharge:  No Barriers Identified   Normajean Baxter, LCSW 06/01/2017, 4:34 PM
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
## 2017-06-02 LAB — CBC
HCT: 26.5 % — ABNORMAL LOW (ref 39.0–52.0)
Hemoglobin: 7.9 g/dL — ABNORMAL LOW (ref 13.0–17.0)
MCH: 21.8 pg — ABNORMAL LOW (ref 26.0–34.0)
MCHC: 29.8 g/dL — AB (ref 30.0–36.0)
MCV: 73 fL — ABNORMAL LOW (ref 78.0–100.0)
Platelets: 281 10*3/uL (ref 150–400)
RBC: 3.63 MIL/uL — ABNORMAL LOW (ref 4.22–5.81)
RDW: 16 % — AB (ref 11.5–15.5)
WBC: 10 10*3/uL (ref 4.0–10.5)

## 2017-06-02 LAB — COMPREHENSIVE METABOLIC PANEL
ALK PHOS: 85 U/L (ref 38–126)
ALT: 6 U/L — ABNORMAL LOW (ref 17–63)
ANION GAP: 6 (ref 5–15)
AST: 17 U/L (ref 15–41)
Albumin: 2.4 g/dL — ABNORMAL LOW (ref 3.5–5.0)
BUN: 47 mg/dL — AB (ref 6–20)
CALCIUM: 8.2 mg/dL — AB (ref 8.9–10.3)
CO2: 24 mmol/L (ref 22–32)
Chloride: 109 mmol/L (ref 101–111)
Creatinine, Ser: 3.93 mg/dL — ABNORMAL HIGH (ref 0.61–1.24)
GFR calc Af Amer: 18 mL/min — ABNORMAL LOW (ref >60)
GFR, EST NON AFRICAN AMERICAN: 15 mL/min — AB (ref >60)
GLUCOSE: 175 mg/dL — AB (ref 65–99)
Potassium: 4.6 mmol/L (ref 3.5–5.1)
Sodium: 139 mmol/L (ref 135–145)
TOTAL PROTEIN: 6.5 g/dL (ref 6.5–8.1)

## 2017-06-02 LAB — GLUCOSE, CAPILLARY
GLUCOSE-CAPILLARY: 154 mg/dL — AB (ref 65–99)
GLUCOSE-CAPILLARY: 138 mg/dL — AB (ref 65–99)
GLUCOSE-CAPILLARY: 136 mg/dL — AB (ref 65–99)

## 2017-06-02 NOTE — Progress Notes (Signed)
Orthopedics Progress Note  Subjective: Patient reports some pain but better this morning.  He is doing his exercises in bed Objective:  Vitals:   06/01/17 2015 06/02/17 0410  BP: (!) 153/75 (!) 142/67  Pulse: 73 72  Resp: 18 18  Temp: 98.4 F (36.9 C) 98 F (36.7 C)    General: Awake and alert  Musculoskeletal: left LE in stump protector and dressing above the knee CDI Neurovascularly intact  Lab Results  Component Value Date   WBC 10.0 06/02/2017   HGB 7.9 (L) 06/02/2017   HCT 26.5 (L) 06/02/2017   MCV 73.0 (L) 06/02/2017   PLT 281 06/02/2017       Component Value Date/Time   NA 139 06/02/2017 0443   K 4.6 06/02/2017 0443   CL 109 06/02/2017 0443   CO2 24 06/02/2017 0443   GLUCOSE 175 (H) 06/02/2017 0443   BUN 47 (H) 06/02/2017 0443   CREATININE 3.93 (H) 06/02/2017 0443   CALCIUM 8.2 (L) 06/02/2017 0443   GFRNONAA 15 (L) 06/02/2017 0443   GFRAA 18 (L) 06/02/2017 0443    Lab Results  Component Value Date   INR 1.17 03/31/2017   INR 1.09 12/18/2013    Assessment/Plan: POD #2 s/p Procedure(s): AMPUTATION BELOW KNEE Continue mobilization NWB on L LE CIR recommended by PT and OT, placement pending Will need to see Dr Doran Durand in two weeks in the office  Remo Lipps R. Veverly Fells, MD 06/02/2017 10:23 AM

## 2017-06-03 LAB — GLUCOSE, CAPILLARY
GLUCOSE-CAPILLARY: 130 mg/dL — AB (ref 65–99)
Glucose-Capillary: 153 mg/dL — ABNORMAL HIGH (ref 65–99)
Glucose-Capillary: 109 mg/dL — ABNORMAL HIGH (ref 65–99)
Glucose-Capillary: 121 mg/dL — ABNORMAL HIGH (ref 65–99)

## 2017-06-03 LAB — CREATININE, SERUM
Creatinine, Ser: 4.04 mg/dL — ABNORMAL HIGH (ref 0.61–1.24)
GFR, EST AFRICAN AMERICAN: 17 mL/min — AB (ref >60)
GFR, EST NON AFRICAN AMERICAN: 15 mL/min — AB (ref >60)

## 2017-06-03 NOTE — Progress Notes (Signed)
    Subjective: 4 Days Post-Op Procedure(s) (LRB): AMPUTATION BELOW KNEE (Left) Patient reports pain as 2 on 0-10 scale.   Denies CP or SOB.  Voiding without difficulty. Positive flatus. Objective: Vital signs in last 24 hours: Temp:  [97.7 F (36.5 C)-98.3 F (36.8 C)] 98.3 F (36.8 C) (07/01 0514) Pulse Rate:  [69-78] 78 (07/01 0514) Resp:  [18] 18 (07/01 0514) BP: (134-171)/(58-83) 171/69 (07/01 0514) SpO2:  [99 %-100 %] 100 % (07/01 0514)  Intake/Output from previous day: 06/30 0701 - 07/01 0700 In: 250 [P.O.:240; I.V.:10] Out: 1201 [Urine:1201] Intake/Output this shift: No intake/output data recorded.  Labs:  Recent Labs  06/02/17 0443  HGB 7.9*    Recent Labs  06/02/17 0443  WBC 10.0  RBC 3.63*  HCT 26.5*  PLT 281    Recent Labs  06/02/17 0443 06/03/17 0439  NA 139  --   K 4.6  --   CL 109  --   CO2 24  --   BUN 47*  --   CREATININE 3.93* 4.04*  GLUCOSE 175*  --   CALCIUM 8.2*  --    No results for input(s): LABPT, INR in the last 72 hours.  Physical Exam: Incision: dressing C/D/I and no drainage  Assessment/Plan: 4 Days Post-Op Procedure(s) (LRB): AMPUTATION BELOW KNEE (Left) Advance diet Up with therapy  No change - pl;an on CIR when bed is ready  Stephan Nelis D for Dr. Melina Schools [facility] [phone] 06/03/2017, 8:04 AM

## 2017-06-04 ENCOUNTER — Inpatient Hospital Stay (HOSPITAL_COMMUNITY)
Admission: RE | Admit: 2017-06-04 | Discharge: 2017-06-12 | DRG: 560 | Disposition: A | Discharge status: Home Health | Payer: Medicare Other | Source: Intra-hospital | Attending: Physical Medicine & Rehabilitation | Admitting: Physical Medicine & Rehabilitation

## 2017-06-04 DIAGNOSIS — G546 Phantom limb syndrome with pain: Secondary | ICD-10-CM | POA: Diagnosis not present

## 2017-06-04 DIAGNOSIS — N186 End stage renal disease: Secondary | ICD-10-CM

## 2017-06-04 DIAGNOSIS — S88112A Complete traumatic amputation at level between knee and ankle, left lower leg, initial encounter: Secondary | ICD-10-CM

## 2017-06-04 DIAGNOSIS — N184 Chronic kidney disease, stage 4 (severe): Secondary | ICD-10-CM | POA: Diagnosis present

## 2017-06-04 DIAGNOSIS — Z4781 Encounter for orthopedic aftercare following surgical amputation: Secondary | ICD-10-CM | POA: Diagnosis present

## 2017-06-04 DIAGNOSIS — Z87891 Personal history of nicotine dependence: Secondary | ICD-10-CM | POA: Diagnosis not present

## 2017-06-04 DIAGNOSIS — S88112S Complete traumatic amputation at level between knee and ankle, left lower leg, sequela: Secondary | ICD-10-CM

## 2017-06-04 DIAGNOSIS — R269 Unspecified abnormalities of gait and mobility: Secondary | ICD-10-CM | POA: Diagnosis not present

## 2017-06-04 DIAGNOSIS — Z992 Dependence on renal dialysis: Secondary | ICD-10-CM

## 2017-06-04 DIAGNOSIS — Z89431 Acquired absence of right foot: Secondary | ICD-10-CM

## 2017-06-04 DIAGNOSIS — Z794 Long term (current) use of insulin: Secondary | ICD-10-CM

## 2017-06-04 DIAGNOSIS — Z8249 Family history of ischemic heart disease and other diseases of the circulatory system: Secondary | ICD-10-CM | POA: Diagnosis not present

## 2017-06-04 DIAGNOSIS — E1122 Type 2 diabetes mellitus with diabetic chronic kidney disease: Secondary | ICD-10-CM | POA: Diagnosis present

## 2017-06-04 DIAGNOSIS — Z89512 Acquired absence of left leg below knee: Secondary | ICD-10-CM | POA: Diagnosis not present

## 2017-06-04 DIAGNOSIS — G8918 Other acute postprocedural pain: Secondary | ICD-10-CM

## 2017-06-04 DIAGNOSIS — I129 Hypertensive chronic kidney disease with stage 1 through stage 4 chronic kidney disease, or unspecified chronic kidney disease: Secondary | ICD-10-CM | POA: Diagnosis present

## 2017-06-04 DIAGNOSIS — D62 Acute posthemorrhagic anemia: Secondary | ICD-10-CM | POA: Diagnosis present

## 2017-06-04 DIAGNOSIS — E785 Hyperlipidemia, unspecified: Secondary | ICD-10-CM | POA: Diagnosis present

## 2017-06-04 DIAGNOSIS — E1142 Type 2 diabetes mellitus with diabetic polyneuropathy: Secondary | ICD-10-CM | POA: Diagnosis present

## 2017-06-04 DIAGNOSIS — I10 Essential (primary) hypertension: Secondary | ICD-10-CM

## 2017-06-04 DIAGNOSIS — Z833 Family history of diabetes mellitus: Secondary | ICD-10-CM

## 2017-06-04 DIAGNOSIS — Z79899 Other long term (current) drug therapy: Secondary | ICD-10-CM

## 2017-06-04 DIAGNOSIS — K5900 Constipation, unspecified: Secondary | ICD-10-CM | POA: Diagnosis present

## 2017-06-04 DIAGNOSIS — K5903 Drug induced constipation: Secondary | ICD-10-CM

## 2017-06-04 DIAGNOSIS — Q7200 Congenital complete absence of unspecified lower limb: Secondary | ICD-10-CM | POA: Insufficient documentation

## 2017-06-04 DIAGNOSIS — E119 Type 2 diabetes mellitus without complications: Secondary | ICD-10-CM

## 2017-06-04 LAB — CBC
HEMATOCRIT: 25.7 % — AB (ref 39.0–52.0)
HEMOGLOBIN: 7.6 g/dL — AB (ref 13.0–17.0)
MCH: 21.5 pg — AB (ref 26.0–34.0)
MCHC: 29.6 g/dL — ABNORMAL LOW (ref 30.0–36.0)
MCV: 72.8 fL — AB (ref 78.0–100.0)
Platelets: 274 10*3/uL (ref 150–400)
RBC: 3.53 MIL/uL — AB (ref 4.22–5.81)
RDW: 15.8 % — ABNORMAL HIGH (ref 11.5–15.5)
WBC: 8.7 10*3/uL (ref 4.0–10.5)

## 2017-06-04 LAB — GLUCOSE, CAPILLARY
GLUCOSE-CAPILLARY: 133 mg/dL — AB (ref 65–99)
GLUCOSE-CAPILLARY: 113 mg/dL — AB (ref 65–99)
GLUCOSE-CAPILLARY: 115 mg/dL — AB (ref 65–99)
Glucose-Capillary: 126 mg/dL — ABNORMAL HIGH (ref 65–99)

## 2017-06-04 LAB — CREATININE, SERUM
CREATININE: 3.91 mg/dL — AB (ref 0.61–1.24)
CREATININE: 3.8 mg/dL — AB (ref 0.61–1.24)
GFR calc Af Amer: 18 mL/min — ABNORMAL LOW (ref >60)
GFR calc Af Amer: 19 mL/min — ABNORMAL LOW (ref >60)
GFR calc non Af Amer: 16 mL/min — ABNORMAL LOW (ref >60)
GFR, EST NON AFRICAN AMERICAN: 16 mL/min — AB (ref >60)

## 2017-06-04 MED ORDER — INSULIN ASPART 100 UNIT/ML ~~LOC~~ SOLN
0.0000 [IU] | Freq: Three times a day (TID) | SUBCUTANEOUS | Status: DC
  Administered 2017-06-05 – 2017-06-10 (×4): 2 [IU] via SUBCUTANEOUS

## 2017-06-04 MED ORDER — AMLODIPINE BESYLATE 10 MG PO TABS
10.0000 mg | ORAL_TABLET | Freq: Every day | ORAL | Status: DC
  Administered 2017-06-04 – 2017-06-11 (×8): 10 mg via ORAL
  Filled 2017-06-04 (×8): qty 1

## 2017-06-04 MED ORDER — SORBITOL 70 % SOLN: 30.0000 mL | Freq: Every day | Status: DC | PRN

## 2017-06-04 MED ORDER — ENOXAPARIN SODIUM 30 MG/0.3ML ~~LOC~~ SOLN
30.0000 mg | SUBCUTANEOUS | Status: DC
  Administered 2017-06-05 – 2017-06-12 (×8): 30 mg via SUBCUTANEOUS
  Filled 2017-06-04 (×8): qty 0.3

## 2017-06-04 MED ORDER — GABAPENTIN 300 MG PO CAPS
300.0000 mg | ORAL_CAPSULE | Freq: Every day | ORAL | Status: DC
  Administered 2017-06-04 – 2017-06-11 (×8): 300 mg via ORAL
  Filled 2017-06-04 (×8): qty 1

## 2017-06-04 MED ORDER — SODIUM POLYSTYRENE SULFONATE 15 GM/60ML PO SUSP: 15.0000 g | ORAL | 0 refills | Status: DC | PRN

## 2017-06-04 MED ORDER — ACETAMINOPHEN 325 MG PO TABS
650.0000 mg | ORAL_TABLET | Freq: Four times a day (QID) | ORAL | Status: DC | PRN
  Administered 2017-06-10: 650 mg via ORAL
  Filled 2017-06-04: qty 2

## 2017-06-04 MED ORDER — OXYCODONE HCL 5 MG PO TABS
5.0000 mg | ORAL_TABLET | ORAL | Status: DC | PRN
  Administered 2017-06-04 – 2017-06-10 (×24): 10 mg via ORAL
  Administered 2017-06-10: 5 mg via ORAL
  Administered 2017-06-10 – 2017-06-12 (×9): 10 mg via ORAL
  Filled 2017-06-04 (×36): qty 2

## 2017-06-04 MED ORDER — DOCUSATE SODIUM 100 MG PO CAPS
100.0000 mg | ORAL_CAPSULE | Freq: Two times a day (BID) | ORAL | Status: DC
  Administered 2017-06-04 – 2017-06-12 (×16): 100 mg via ORAL
  Filled 2017-06-04 (×16): qty 1

## 2017-06-04 MED ORDER — ONDANSETRON HCL 4 MG PO TABS: 4.0000 mg | ORAL_TABLET | Freq: Four times a day (QID) | ORAL | Status: DC | PRN

## 2017-06-04 MED ORDER — CARVEDILOL 25 MG PO TABS
25.0000 mg | ORAL_TABLET | Freq: Two times a day (BID) | ORAL | Status: DC
  Administered 2017-06-05 – 2017-06-12 (×15): 25 mg via ORAL
  Filled 2017-06-04 (×15): qty 1

## 2017-06-04 MED ORDER — SODIUM BICARBONATE 650 MG PO TABS
650.0000 mg | ORAL_TABLET | Freq: Two times a day (BID) | ORAL | Status: DC
  Administered 2017-06-04 – 2017-06-12 (×16): 650 mg via ORAL
  Filled 2017-06-04 (×16): qty 1

## 2017-06-04 MED ORDER — SODIUM CHLORIDE 0.9% FLUSH: 10.0000 mL | INTRAVENOUS | Status: DC | PRN

## 2017-06-04 MED ORDER — SODIUM CHLORIDE 0.9% FLUSH
10.0000 mL | INTRAVENOUS | Status: DC | PRN
  Administered 2017-06-08 – 2017-06-09 (×2): 10 mL
  Filled 2017-06-04 (×2): qty 40

## 2017-06-04 MED ORDER — SENNA 8.6 MG PO TABS
1.0000 | ORAL_TABLET | Freq: Two times a day (BID) | ORAL | Status: DC
  Administered 2017-06-04 – 2017-06-12 (×16): 8.6 mg via ORAL
  Filled 2017-06-04 (×16): qty 1

## 2017-06-04 MED ORDER — ACETAMINOPHEN 650 MG RE SUPP: 650.0000 mg | Freq: Four times a day (QID) | RECTAL | Status: DC | PRN

## 2017-06-04 MED ORDER — ONDANSETRON HCL 4 MG/2ML IJ SOLN: 4.0000 mg | Freq: Four times a day (QID) | INTRAMUSCULAR | Status: DC | PRN

## 2017-06-04 MED ORDER — INSULIN GLARGINE 100 UNIT/ML ~~LOC~~ SOLN
12.0000 [IU] | Freq: Two times a day (BID) | SUBCUTANEOUS | Status: DC
  Administered 2017-06-04 – 2017-06-12 (×16): 12 [IU] via SUBCUTANEOUS
  Filled 2017-06-04 (×17): qty 0.12

## 2017-06-04 MED ORDER — ATORVASTATIN CALCIUM 10 MG PO TABS
10.0000 mg | ORAL_TABLET | Freq: Every day | ORAL | Status: DC
  Administered 2017-06-04 – 2017-06-11 (×8): 10 mg via ORAL
  Filled 2017-06-04 (×8): qty 1

## 2017-06-04 MED ORDER — ENOXAPARIN SODIUM 30 MG/0.3ML ~~LOC~~ SOLN: 30.0000 mg | SUBCUTANEOUS | Status: DC

## 2017-06-04 NOTE — Progress Notes (Signed)
I met with pt at bedside to discuss a possible inpt rehab admit today pending Insurance approval with [employer] which I have initiated today. Pt is in agreement. I will follow up once I hear from insurance. [phone]
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
## 2017-06-04 NOTE — Progress Notes (Signed)
Pt arrived to room 4W09 from 5N. Alert and oriented x4, denies pain at present. VSS. Report to night shift RN.
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
## 2017-06-04 NOTE — Progress Notes (Signed)
I have insurance approval for admit to CIR today. RN CM made aware. I will make the arrangements to admit today. [phone]
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
## 2017-06-04 NOTE — Progress Notes (Signed)
RN gave report to CIR.

## 2017-06-04 NOTE — IPOC Note (Signed)
Overall Plan of Care [facility]) Patient Details Name: [name] MRN: [id number] DOB: 01/05/1961  Admitting Diagnosis: L BKA  Hospital Problems: Active Problems:   Amputation of left lower extremity below knee (HCC)   Unilateral complete BKA, left, sequela (HCC)   Abnormality of gait   Phantom limb pain (HCC)   Chronic kidney disease (CKD), stage IV (severe) (HCC)   Type 2 diabetes mellitus with peripheral neuropathy (HCC)   Hyperlipidemia   Drug-induced constipation     Functional Problem List: Nursing Pain, Safety  PT Balance, Endurance, Pain, Safety  OT Balance, Motor, Pain, Endurance  SLP    TR         Basic ADL's: OT Grooming, Bathing, Dressing, Toileting     Advanced  ADL's: OT Simple Meal Preparation, Light Housekeeping     Transfers: PT Bed Mobility, Bed to Chair, Teacher, early years/pre, Tub/Shower     Locomotion: PT Ambulation, Stairs     Additional Impairments: OT None  SLP        TR      Anticipated Outcomes Item Anticipated Outcome  Self Feeding independent  Swallowing      Basic self-care  modified independent  Toileting  modified independent   Bathroom Transfers modified independent  Bowel/Bladder  Patient to maintain continence.  Transfers  mod I  Locomotion  mod I ambulatory   Communication     Cognition     Pain  <3.  Safety/Judgment  No falls.   Therapy Plan: PT Intensity: Minimum of 1-2 x/day ,45 to 90 minutes PT Frequency: 5 out of 7 days PT Duration Estimated Length of Stay: 5-7 days OT Intensity: Minimum of 1-2 x/day, 45 to 90 minutes OT Frequency: 5 out of 7 days OT Duration/Estimated Length of Stay: 5-7 days         Team Interventions: Nursing Interventions Pain Management, Skin Care/Wound Management, Patient/Family Education  PT interventions Ambulation/gait training, Training and development officer, Academic librarian, Discharge planning, DME/adaptive equipment instruction, Functional mobility training,  Neuromuscular re-education, Pain management, Patient/family education, Psychosocial support, Splinting/orthotics, Stair training, Therapeutic Exercise, Therapeutic Activities, UE/LE Strength taining/ROM, UE/LE Coordination activities, Wheelchair propulsion/positioning  OT Interventions Balance/vestibular training, Pain management, Self Care/advanced ADL retraining, Therapeutic Activities, Functional mobility training, DME/adaptive equipment instruction, UE/LE Strength taining/ROM, Therapeutic Exercise, Neuromuscular re-education, UE/LE Coordination activities, Discharge planning, Community reintegration  SLP Interventions    TR Interventions    SW/CM Interventions Discharge Planning, Barrister's clerk, Patient/Family Education    Team Discharge Planning: Destination: PT-  ,OT- Home , SLP-  Projected Follow-up: PT-Home health PT, OT-  Home health OT, SLP-  Projected Equipment Needs: PT-Rolling walker with 5" wheels, OT- Tub/shower bench, 3 in 1 bedside comode, Tub/shower seat, SLP-  Equipment Details: PT- , OT-  Patient/family involved in discharge planning: PT- Patient,  OT-Patient, SLP-   MD ELOS: 5-7 days. 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 Doran Durand. Hospital course pain management. Acute blood loss anemia monitor. Renal function remained stable. Pt with resulting functional deficits with moblity, transfers, self-care.  Will set goals for Mod I with PT/OT.   See Team Conference Notes for weekly updates to the plan of care
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
## 2017-06-04 NOTE — Progress Notes (Signed)
Subjective: 5 Days Post-Op Procedure(s) (LRB): AMPUTATION BELOW KNEE (Left)  Patient reports pain as mild to moderate.  Tolerating POs well.  Admits to flatus.  Denies fever, chills, N/V, SOB, CP.  Resting comfortably in chair this morning.  Objective:   VITALS:  Temp:  [97.7 F (36.5 C)-98.5 F (36.9 C)] 98.3 F (36.8 C) (07/02 0616) Pulse Rate:  [73-76] 76 (07/02 0616) Resp:  [18] 18 (07/02 0616) BP: (146-163)/(72-74) 146/72 (07/02 0616) SpO2:  [97 %-100 %] 100 % (07/02 0616)  General: WDWN patient in NAD. Psych:  Appropriate mood and affect. Neuro:  A&O x 3, Moving all extremities, sensation intact to light touch HEENT:  EOMs intact Chest:  Even non-labored respirations Skin:  Dressing C/D/I, no rashes or lesions.  Stump protector in place. Extremities: warm/dry, no visible edema, erythema, or echymosis.  No lymphadenopathy. Pulses: Femoral 2+ MSK:  ROM: TKE, MMT: patient is able to perform quad set    LABS  Recent Labs  06/02/17 0443  HGB 7.9*  WBC 10.0  PLT 281    Recent Labs  06/02/17 0443 06/03/17 0439 06/04/17 0421  NA 139  --   --   K 4.6  --   --   CL 109  --   --   CO2 24  --   --   BUN 47*  --   --   CREATININE 3.93* 4.04* 3.91*  GLUCOSE 175*  --   --    No results for input(s): LABPT, INR in the last 72 hours.   Assessment/Plan: 5 Days Post-Op Procedure(s) (LRB): AMPUTATION BELOW KNEE (Left)  NWB [name] Stump protector on at all times Has been accepted to CIR, awaiting bed. Plan for outpatient f/u visit with Dr. Milus Height, [facility] Office:  [phone]

## 2017-06-05 ENCOUNTER — Inpatient Hospital Stay (HOSPITAL_COMMUNITY): Payer: Medicare Other | Admitting: Occupational Therapy

## 2017-06-05 ENCOUNTER — Inpatient Hospital Stay (HOSPITAL_COMMUNITY): Payer: BLUE CROSS/BLUE SHIELD | Admitting: Occupational Therapy

## 2017-06-05 ENCOUNTER — Inpatient Hospital Stay (HOSPITAL_COMMUNITY): Payer: BLUE CROSS/BLUE SHIELD | Admitting: Physical Therapy

## 2017-06-05 LAB — COMPREHENSIVE METABOLIC PANEL
ALT: 10 U/L — ABNORMAL LOW (ref 17–63)
ANION GAP: 8 (ref 5–15)
AST: 17 U/L (ref 15–41)
Albumin: 2.3 g/dL — ABNORMAL LOW (ref 3.5–5.0)
Alkaline Phosphatase: 73 U/L (ref 38–126)
BUN: 54 mg/dL — ABNORMAL HIGH (ref 6–20)
CHLORIDE: 109 mmol/L (ref 101–111)
CO2: 22 mmol/L (ref 22–32)
Calcium: 8.4 mg/dL — ABNORMAL LOW (ref 8.9–10.3)
Creatinine, Ser: 3.93 mg/dL — ABNORMAL HIGH (ref 0.61–1.24)
GFR calc non Af Amer: 15 mL/min — ABNORMAL LOW (ref >60)
GFR, EST AFRICAN AMERICAN: 18 mL/min — AB (ref >60)
Glucose, Bld: 129 mg/dL — ABNORMAL HIGH (ref 65–99)
POTASSIUM: 4.5 mmol/L (ref 3.5–5.1)
SODIUM: 139 mmol/L (ref 135–145)
Total Bilirubin: 0.4 mg/dL (ref 0.3–1.2)
Total Protein: 6.3 g/dL — ABNORMAL LOW (ref 6.5–8.1)

## 2017-06-05 LAB — CBC WITH DIFFERENTIAL/PLATELET
BASOS PCT: 0 %
Basophils Absolute: 0 10*3/uL (ref 0.0–0.1)
EOS ABS: 0.2 10*3/uL (ref 0.0–0.7)
Eosinophils Relative: 2 %
HCT: 24.9 % — ABNORMAL LOW (ref 39.0–52.0)
Hemoglobin: 7.4 g/dL — ABNORMAL LOW (ref 13.0–17.0)
Lymphocytes Relative: 27 %
Lymphs Abs: 2.1 10*3/uL (ref 0.7–4.0)
MCH: 21.7 pg — AB (ref 26.0–34.0)
MCHC: 29.7 g/dL — AB (ref 30.0–36.0)
MCV: 73 fL — ABNORMAL LOW (ref 78.0–100.0)
MONO ABS: 0.7 10*3/uL (ref 0.1–1.0)
Monocytes Relative: 9 %
NEUTROS ABS: 4.9 10*3/uL (ref 1.7–7.7)
NEUTROS PCT: 62 %
PLATELETS: 244 10*3/uL (ref 150–400)
RBC: 3.41 MIL/uL — ABNORMAL LOW (ref 4.22–5.81)
RDW: 16.3 % — ABNORMAL HIGH (ref 11.5–15.5)
WBC: 7.9 10*3/uL (ref 4.0–10.5)

## 2017-06-05 LAB — GLUCOSE, CAPILLARY
GLUCOSE-CAPILLARY: 116 mg/dL — AB (ref 65–99)
GLUCOSE-CAPILLARY: 112 mg/dL — AB (ref 65–99)
Glucose-Capillary: 128 mg/dL — ABNORMAL HIGH (ref 65–99)
Glucose-Capillary: 91 mg/dL (ref 65–99)

## 2017-06-05 NOTE — Care Management Note (Signed)
Inpatient [facility] Individual Statement of Services  Patient Name:  [name]  Date:  06/05/2017  Welcome to the [facility].  Our goal is to provide you with an individualized program based on your diagnosis and situation, designed to meet your specific needs.  With this comprehensive rehabilitation program, you will be expected to participate in at least 3 hours of rehabilitation therapies Monday-Friday, with modified therapy programming on the weekends.  Your rehabilitation program will include the following services:  Physical Therapy (PT), Occupational Therapy (OT), 24 hour per day rehabilitation nursing, Neuropsychology, Case Management (Social Worker), Rehabilitation Medicine, Nutrition Services and Pharmacy Services  Weekly team conferences will be held on Wednesday to discuss your progress.  Your Social Worker will talk with you frequently to get your input and to update you on team discussions.  Team conferences with you and your family in attendance may also be held.  Expected length of stay: 5-7 days  Overall anticipated outcome: mod/i level  Depending on your progress and recovery, your program may change. Your Social Worker will coordinate services and will keep you informed of any changes. Your Social Worker's name and contact numbers are listed  below.  The following services may also be recommended but are not provided by the [facility] will be made to provide these services after discharge if needed.  Arrangements include referral to agencies that provide these services.  Your insurance has been verified to be:  [place] Your primary doctor is:    Pertinent information will be shared with your doctor and your insurance company.  Social Worker:  Ovidio Kin, [place] or  [phone]  Information discussed with and copy given to patient by: Elease Hashimoto, 06/05/2017, 9:53 AM

## 2017-06-05 NOTE — Progress Notes (Signed)
Occupational Therapy Note  Patient Details  Name: [name] MRN: [id number] Date of Birth: [birth year]  Today's Date: 06/05/2017 OT Individual Time: 1115-1200 OT Individual Time Calculation (min): 45 min   No c/o pain Pt seen this session for UE strengthening, activity tolerance, transfers.  Pt received in bed and sat to EOB without A or rails.  Used RW to stand and transfer to w/c with close S.  Worked on activity tolerance with self propelling w/c to gym.   Educated pt on need to strengthen upper back to avoid shoulder over use with use of RW.  Pt used Level 4 theraband to complete 10 reps of horizontal shoulder abduction at chest height and then 10 more with arms overhead for 4 sets.  Pt pushed chair back to room. He stated he has been actively reading the limb loss resources provided to him. Pt in room with all needs met.    [place] 06/05/2017, 12:59 PM
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
## 2017-06-05 NOTE — Progress Notes (Signed)
Occupational Therapy Session Note  Patient Details  Name: [name] MRN: [id number] Date of Birth: [birth year]  Today's Date: 06/05/2017 OT Individual Time: 1430-1500 OT Individual Time Calculation (min): 30 min    Short Term Goals: Week 1:  OT Short Term Goal 1 (Week 1): STGs equal to LTGs secondary to ELOS  Skilled Therapeutic Interventions/Progress Updates:  Balance/vestibular training;Pain management;Self Care/advanced ADL retraining;Therapeutic Activities;Functional mobility training;DME/adaptive equipment instruction;UE/LE Strength taining/ROM;Therapeutic Exercise;Neuromuscular re-education;UE/LE Coordination activities;Discharge planning;Community reintegration   1:1 Focus on transfer training, sit to stands, full hip extension in prone on the mat, with bilateral LE exercises for LB strengthening to assist with transfers and maintain strengthen in prep for ADLs with prosethsis in the future.   Therapy Documentation Precautions:  Precautions Precautions: Fall Precaution Comments: L residual limb with plastic KI guard  Required Braces or Orthoses: Other Brace/Splint Other Brace/Splint: L plastic KI guard, R AFO  Restrictions Weight Bearing Restrictions: Yes [name] Weight Bearing: Non weight bearing Pain: No c/o pain  See Function Navigator for Current Functional Status.   Therapy/Group: Individual Therapy  Willeen [facility] 06/05/2017, 4:37 PM
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
## 2017-06-05 NOTE — Discharge Summary (Signed)
Physician Discharge Summary  Patient ID: [name] MRN: [id number] DOB/AGE: 12-Jul-1961 56 y.o.  Admit date: 05/30/2017 Discharge date: 06/04/2017  Admission Diagnoses: DM type 2; anemia of chronic disease; CKD; HTN; S/P L transmetatarsal amputation complicated by wound dehiscence, gangrenous changes, and osteomyelitis, hx of hyponatremia, hx of phantom limb pain  Discharge Diagnoses:  Active Problems:   Anemia of chronic disease   CKD (chronic kidney disease) stage 4, GFR 15-29 ml/min (HCC)   Essential hypertension   S/P BKA (below knee amputation), left (HCC)   DM type 2 causing CKD stage 4 (HCC)   S/P unilateral BKA (below knee amputation), left (HCC)   Benign essential HTN   Post-operative pain   Acute blood loss anemia same as above  Discharged Condition: stable  Hospital Course: Patient presented to [facility] on 05/30/17 for elective L BKA by Dr. [name].  He had a L transmetatarsal amputation in April complicated by wound dehiscence, gangrenous changes, and osteomyelitis.  The patient tolerated the procedure well without complication.  The patient was admitted to the hospital for 24 hours of antibiotics.  The Medicine service was consult to assist with his complicated medical issues consisting of uncontrolled diabetes and CKD.  He worked well with PT/OT during his stay.  CIR was consulted and the patient was eventually accepted.  He was transferred to CIR on 06/04/17.  The patient tolerated his stay well while on [place].  Consults: Medicine team, PT/OT, CIR consultation  Significant Diagnostic Studies: labs: BMP to monitor renal function  Treatments: IV hydration, antibiotics: Ancef, analgesia: acetaminophen, acetaminophen w/ codeine and Morphine, cardiac meds: amlodipine, carvedilol, anticoagulation: lovenox, insulin: Humalog and surgery: as stated above  Discharge Exam: Blood pressure 140/65, pulse 72, temperature 98.7 F (37.1 C), temperature source Oral, resp.  rate 16, weight 98 kg (216 lb), SpO2 100 %. General: WDWN patient in NAD. Psych:  Appropriate mood and affect. Neuro:  A&O x 3, Moving all extremities, sensation intact to light touch HEENT:  EOMs intact Chest:  Even non-labored respirations Skin:  Dressing C/D/I, no rashes or lesions.  Stump protector applied appropriately. Extremities: warm/dry, no visible edema, erythema, or echymosis.  No lymphadenopathy. Pulses: Femoral 2+ MSK:  ROM: TKE, MMT: patient can perform quad set   Disposition: 62-Rehab Facility  Discharge Instructions    Call MD / Call 911    Complete by:  As directed    If you experience chest pain or shortness of breath, CALL 911 and be transported to the hospital emergency room.  If you develope a fever above 101 F, pus (white drainage) or increased drainage or redness at the wound, or calf pain, call your surgeon's office.   Constipation Prevention    Complete by:  As directed    Drink plenty of fluids.  Prune juice may be helpful.  You may use a stool softener, such as Colace (over the counter) 100 mg twice a day.  Use MiraLax (over the counter) for constipation as needed.   Diet - low sodium heart healthy    Complete by:  As directed    Increase activity slowly as tolerated    Complete by:  As directed    Non weight bearing    Complete by:  As directed    Laterality:  left   Extremity:  Lower     Allergies as of 06/04/2017   No Known Allergies     Medication List    STOP taking these medications  traMADol 50 MG tablet Commonly known as:  ULTRAM     TAKE these medications   aspirin EC 81 MG tablet Take 1 tablet (81 mg total) by mouth 2 (two) times daily.   atorvastatin 10 MG tablet Commonly known as:  LIPITOR Take 10 mg by mouth at bedtime.   carvedilol 25 MG tablet Commonly known as:  COREG Take 25 mg by mouth 2 (two) times daily with a meal.   docusate sodium 100 MG capsule Commonly known as:  COLACE Take 1 capsule (100 mg total) by mouth 2  (two) times daily. While taking narcotic pain medicine.   gabapentin 300 MG capsule Commonly known as:  NEURONTIN Take 300 mg by mouth 3 (three) times daily.   HUMULIN 70/30 [place] Inject 30 Units into the skin 2 (two) times daily.   Iron 325 (65 Fe) MG Tabs Take 1 tablet by mouth daily.   NORVASC 10 MG tablet Generic drug:  amLODipine Take 10 mg by mouth at bedtime.   oxyCODONE 5 MG immediate release tablet Commonly known as:  ROXICODONE Take 1-2 tablets (5-10 mg total) by mouth every 4 (four) hours as needed for moderate pain or severe pain. For no more than 3 days.   senna 8.6 MG Tabs tablet Commonly known as:  SENOKOT Take 2 tablets (17.2 mg total) by mouth 2 (two) times daily.   sodium bicarbonate 650 MG tablet Take 650 mg by mouth 2 (two) times daily.   sodium polystyrene 15 GM/60ML suspension Commonly known as:  KAYEXALATE Take 60 mLs (15 g total) by mouth every other day as needed (k >5.5).      Follow-up Information    [name], MD. Schedule an appointment as soon as possible for a visit in 2 week(s).   Specialty:  Orthopedic Surgery Contact information: [address] [place] [address] [phone]           Signed: Chetan Mehring, [place] Office:  [phone]

## 2017-06-05 NOTE — Progress Notes (Signed)
Patient reported to have arrived to unit at 1845. Patient has been alert and oriented without s/s of distress. Dressing to [facility] C/D/I.
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
## 2017-06-05 NOTE — Progress Notes (Signed)
[facility] PHYSICAL MEDICINE & REHABILITATION     PROGRESS NOTE  Subjective/Complaints:  Pt seen laying in bed this AM.  He slept well overnight.  He keeps his eyes closed through the encounter.  ROS: Denies CP, SOB, N/V/D.  Objective: Vital Signs: Blood pressure (!) 170/76, pulse 73, temperature 98.5 F (36.9 C), temperature source Oral, resp. rate 16, height 6\' 3"  (1.905 m), weight 97.9 kg (215 lb 12.2 oz), SpO2 100 %. No results found.  Recent Labs  06/04/17 2036 06/05/17 0440  WBC 8.7 7.9  HGB 7.6* 7.4*  HCT 25.7* 24.9*  PLT 274 244    Recent Labs  06/04/17 2036 06/05/17 0440  NA  --  139  K  --  4.5  CL  --  109  GLUCOSE  --  129*  BUN  --  54*  CREATININE 3.80* 3.93*  CALCIUM  --  8.4*   CBG (last 3)   Recent Labs  06/04/17 1702 06/04/17 2158 06/05/17 0654  GLUCAP 113* 115* 128*    Wt Readings from Last 3 Encounters:  06/04/17 97.9 kg (215 lb 12.2 oz)  05/30/17 98 kg (216 lb)  05/10/17 98.1 kg (216 lb 3.2 oz)    Physical Exam:  BP (!) 170/76 (BP Location: Right Arm)   Pulse 73   Temp 98.5 F (36.9 C) (Oral)   Resp 16   Ht 6\' 3"  (1.905 m)   Wt 97.9 kg (215 lb 12.2 oz)   SpO2 100%   BMI 26.97 kg/m  Constitutional: He appears well-developed and well-nourished.  HENT: Normocephalic and atraumatic.  Eyes: EOM are normal. No discharge.  Cardiovascular: Normal rate, regular rhythm. No JVD  Respiratory: Effort normal and breath sounds normal. GI: Soft. Bowel sounds are normal.  Musculoskeletal: He exhibits edema and tenderness.  Left BKA Right transmet amputation  Neurological: He is alert and oriented to person, place, and time.  Motor: 5/5 throughout, except Left HF (4+/5 - pain inhibition, stable)  Skin: Skin is warm and dry.  BKA dressing c/d/i  Psychiatric: He has a normal mood and affect. His behavior is normal. Thought content normal.    Assessment/Plan: 1. Functional deficits secondary to left BKA 05/30/2017 as well as history of  right foot transmetatarsal amputation  which require 3+ hours per day of interdisciplinary therapy in a comprehensive inpatient rehab setting. Physiatrist is providing close team supervision and 24 hour management of active medical problems listed below. Physiatrist and rehab team continue to assess barriers to discharge/monitor patient progress toward functional and medical goals.  Function:  Bathing Bathing position      Bathing parts      Bathing assist        Upper Body Dressing/Undressing Upper body dressing                    Upper body assist        Lower Body Dressing/Undressing Lower body dressing                                  Lower body assist        Toileting Toileting   Toileting steps completed by patient: Adjust clothing prior to toileting, Performs perineal hygiene, Adjust clothing after toileting   Toileting Assistive Devices: Other (comment) Gilford Rile)  Toileting assist Assist level: No help/no cues   Transfers Chair/bed transfer   Chair/bed transfer method: Ambulatory Chair/bed transfer assist level:  Supervision or verbal cues Chair/bed transfer assistive device: Water engineer Comprehension Comprehension assist level: Follows basic conversation/direction with no assist  Expression Expression assist level: Expresses basic needs/ideas: With no assist  Social Interaction Social Interaction assist level: Interacts appropriately with others - No medications needed.  Problem Solving Problem solving assist level: Solves basic problems with no assist  Memory Memory assist level: Complete Independence: No helper    Medical Problem List and Plan: 1.  Decreased functional mobility secondary to left BKA 05/30/2017 as well as history of right foot transmetatarsal amputation January 2015  Begin CIR 2.  DVT Prophylaxis/Anticoagulation: Subcutaneous Lovenox. Monitor platelet  counts and any signs of bleeding 3. Pain Management: Neurontin 300 mg daily at bedtime, oxycodone as needed 4. Mood: Provide emotional support 5. Neuropsych: This patient is capable of making decisions on his own behalf. 6. Skin/Wound Care: Routine skin checks 7. Fluids/Electrolytes/Nutrition: Routine I&O's  8. Acute blood loss anemia.   Hb 7.4 on 7/3  Cont to monitor 9. CKD stage IV. Baseline creatinine 3.85-4.28.   Cr. 3.93 on 7/3  Cont to monitor 10. Hypertension. Norvasc 10 mg daily, Coreg 25 mg twice a day  Monitor with increased mobility 11. Diabetes mellitus with peripheral neuropathy. Hemoglobin A1c 6.9. Lantus insulin 12 units twice a day. Check blood sugars before meals and at bedtime. Diabetic teaching  Monitor with increased mobility 12. Hyperlipidemia. Lipitor 13. Constipation. Laxative assistance  LOS (Days) 1 A FACE TO FACE EVALUATION WAS PERFORMED  Ankit Lorie Phenix 06/05/2017 8:24 AM

## 2017-06-06 ENCOUNTER — Inpatient Hospital Stay (HOSPITAL_COMMUNITY): Payer: Medicare Other | Admitting: Occupational Therapy

## 2017-06-06 ENCOUNTER — Inpatient Hospital Stay (HOSPITAL_COMMUNITY): Payer: BLUE CROSS/BLUE SHIELD | Admitting: Physical Therapy

## 2017-06-06 LAB — GLUCOSE, CAPILLARY
GLUCOSE-CAPILLARY: 90 mg/dL (ref 65–99)
GLUCOSE-CAPILLARY: 87 mg/dL (ref 65–99)
Glucose-Capillary: 158 mg/dL — ABNORMAL HIGH (ref 65–99)
Glucose-Capillary: 93 mg/dL (ref 65–99)
Glucose-Capillary: 130 mg/dL — ABNORMAL HIGH (ref 65–99)

## 2017-06-06 NOTE — Progress Notes (Signed)
Occupational Therapy Session Note  Patient Details  Name: [name] MRN: [id number] Date of Birth: 04/18/1961  Today's Date: 06/06/2017 OT Individual Time: 7939-6886 OT Individual Time Calculation (min): 47 min    Short Term Goals: Week 1:  OT Short Term Goal 1 (Week 1): STGs equal to LTGs secondary to ELOS  Skilled Therapeutic Interventions/Progress Updates:    Had pt transfer bed to wheelchair with min guard assist using the RW for support.  Once in the chair he was able to propel his wheelchair down to the tub/shower room for practice using the tub bench.  Educated pt on need for tub/shower bench, hand held shower, and shower curtain liner for use when home.  He was able to complete stand pivot transfer to the tub/shower bench with supervision using the RW for support.  He next ambulated back toward his room with min guard assist.  He needed use of the wheelchair after approximately half way.  Squat pivot transfer back to bed with min guard assist to conclude session.  Call button and phone in reach.    Therapy Documentation Precautions:  Precautions Precautions: Fall Precaution Comments: L residual limb with plastic KI guard  Required Braces or Orthoses: Other Brace/Splint Other Brace/Splint: L plastic KI guard, R AFO  Restrictions Weight Bearing Restrictions: Yes [name] Weight Bearing: Non weight bearing  Pain: Pain Assessment Pain Assessment: Faces Pain Score: 6  Faces Pain Scale: Hurts a little bit Pain Type: Surgical pain Pain Location: Leg Pain Orientation: Left Pain Descriptors / Indicators: Discomfort Pain Frequency: Intermittent Pain Onset: With Activity Patients Stated Pain Goal: 2 Pain Intervention(s): Repositioned ADL: See Function Navigator for Current Functional Status.   Therapy/Group: Individual Therapy  [name] OTR/L 06/06/2017, 3:59 PM

## 2017-06-06 NOTE — Progress Notes (Signed)
Physical Therapy Session Note  Patient Details  Name: [name] MRN: [id number] Date of Birth: [birth year]  Today's Date: 06/06/2017 PT Individual Time: 1110-1210 1300-1330  PT Individual Time Calculation (min): 60 min and 30 min  Short Term Goals: Week 1:  PT Short Term Goal 1 (Week 1): =LTGs due to ELOS  Skilled Therapeutic Interventions/Progress Updates: Tx1: Pt presented asleep but easily aroused and agreeable to therapy. Performed squat pivot transfer bed to w/c with minA for safety. Pt propelled to rehab gym with min guard, one occurrence of cues for navigating w/c due to bumping into wall. Pt ambulated x 10 ft and engaged in standing balance catching/throwing beach ball with RUE only maintaining support with LUE. Pt able to tolerate standing x 5 min before needing seated rest. Pt participated in standing therex including hip flexion, hip abd/add, hip ext, and chair push ups x 15 bilaterally. Pt ambulated 4f and 330fwith min guard. Pt required occasional brief rests throughout session due to fatigue with quick recovery. Propelled self back to room and performed squat pivot back to bed in same manner as prior. Pt remained sitting at EOB at end of session to eat lunch with needs met.  Tx2: Pt presented in bed awake agreeable to therapy. Performed bed mobility supervision with bed rail. Propelled to rehab gym supervision. Use of UBE L3 x 6 min for endurance. Performed UE therex for stregthening and conditioning: bicep curls 8lb 2 x 10, triceps extensions 8lb 2 x 10, shoulder flexion with 4lb dowel 2 x 10. Pt returned to room and performed ambulatory transfer w/c to toilet approx 1064fPt left a toilet with NT notified.      Therapy Documentation Precautions:  Precautions Precautions: Fall Precaution Comments: L residual limb with plastic KI guard  Required Braces or Orthoses: Other Brace/Splint Other Brace/Splint: L plastic KI guard, R AFO  Restrictions Weight Bearing  Restrictions: Yes [name] Weight Bearing: Non weight bearing General:   Vital Signs:  Pain: Pain Assessment Pain Assessment: Faces Faces Pain Scale: Hurts a little bit Pain Type: Surgical pain Pain Location: Leg Pain Descriptors / Indicators: Discomfort Pain Onset: With Activity Pain Intervention(s): Repositioned   See Function Navigator for Current Functional Status.   Therapy/Group: Individual Therapy  Travious Vanover  Arelys Glassco, PTA  06/06/2017, 12:24 PM

## 2017-06-06 NOTE — Patient Care Conference (Signed)
Inpatient RehabilitationTeam Conference and Plan of Care Update Date: 06/06/2017   Time: 10:00 AM    Patient Name: [name]      Medical Record Number: [id number]  Date of Birth: December 09, 1960 Sex: Male         Room/Bed: 4M09C/4M09C-01 Payor Info: Payor: BLUE CROSS BLUE [employer]IELD / Plan: BCBS OTHER / Product Type: *No Product type* /    Admitting Diagnosis: L BKA  Admit Date/Time:  06/04/2017  6:29 PM Admission Comments: No comment available   Primary Diagnosis:  <principal problem not specified> Principal Problem: <principal problem not specified>  Patient Active Problem List   Diagnosis Date Noted  . Amputation of left lower extremity below knee ([place]) 06/04/2017  . Unilateral complete BKA, left, sequela ([place])   . Abnormality of gait   . Phantom limb pain ([place])   . Chronic kidney disease (CKD), stage IV (severe) ([place])   . Type 2 diabetes mellitus with peripheral neuropathy ([facility])   . Hyperlipidemia   . Drug-induced constipation   . S/P unilateral BKA (below knee amputation), left ([place])   . Benign essential HTN   . Post-operative pain   . Acute blood loss anemia   . S/P BKA (below knee amputation), left ([place]) 05/30/2017  . DM type 2 causing CKD stage 4 ([place]) 05/30/2017  . Diabetic infection of left foot ([place])   . Left foot infection 03/30/2017  . AKI (acute kidney injury) ([place]) 03/30/2017  . Hyponatremia 03/30/2017  . Essential hypertension 03/30/2017  . Necrosis of toe ([place]) 12/18/2013  . Osteomyelitis ([place]) 12/18/2013  . Leukocytosis 12/18/2013  . Anemia of chronic disease 12/18/2013  . CKD (chronic kidney disease) stage 4, GFR 15-29 ml/min ([place]) 12/18/2013    Expected Discharge Date: Expected Discharge Date: 06/12/17  Team Members Present: Physician leading conference: Dr. Delice Lesch Social Worker Present: Ovidio Kin, LCSW Nurse Present: Other (comment) Alda Lea) PT Present: Kem Parkinson, PT OT Present: Clyda Greener, OT SLP Present: Windell Moulding,  SLP PPS Coordinator present : Daiva Nakayama, RN, CRRN     Current Status/Progress Goal Weekly Team Focus  Medical   Decreased functional mobility secondary to left BKA 05/30/2017 as well as history of right foot transmetatarsal amputation January 2015  Improve mobility, transfers, DM/HTN  See above   Bowel/Bladder   Continent of bowel and bladder.  For patient to maintain continence and regularity.  Monitor for any changes in continence or regularity.   Swallow/Nutrition/ Hydration             ADL's   Pt is currently supervision for UB selfcare, min guard overall for LB selfcare, min assist for transfers stand pivot with use of the RW.  modified independent overall  selfcare retraining, balance retraining, transfer training, DME education, pt/family education, therapeutic exercise   Mobility   min guard overall  mod I with RW, ambulatory  endurance, strengthening, HEP, balance   Communication             Safety/Cognition/ Behavioral Observations            Pain   Pain r/t amputation ranging from 0 to 9.  Keep pain <3.  Monitor for pain and treat as needed.   Skin   Intact but for surgical inscion.  Maintain skin integrity and for surgical wound to heal without complications.  Healing of surgical wound without complication.      *See Care Plan and progress notes for long and short-term goals.  Barriers to Discharge: Mobility, transfers,  DM, HTN, CKD, ABLA    Possible Resolutions to Barriers:  Therapies, monitor labs, optimize DM/HTN meds    Discharge Planning/Teaching Needs:  Home with wife who has back issues but can provide supervision level.       Team Discussion:  Goals of mod/i level, currently min assist level. Working on endurance and pain management. MD adjusting meds for HTN and diabetes. Monitoring CKD and labs.   Revisions to Treatment Plan:  DC 7/10   Continued Need for Acute Rehabilitation Level of Care: The patient requires daily medical management by a  physician with specialized training in physical medicine and rehabilitation for the following conditions: Daily direction of a multidisciplinary physical rehabilitation program to ensure safe treatment while eliciting the highest outcome that is of practical value to the patient.: Yes Daily medical management of patient stability for increased activity during participation in an intensive rehabilitation regime.: Yes Daily analysis of laboratory values and/or radiology reports with any subsequent need for medication adjustment of medical intervention for : Post surgical problems;Diabetes problems;Blood pressure problems;Renal problems  Elease Hashimoto 06/06/2017, 1:38 PM

## 2017-06-06 NOTE — Progress Notes (Signed)
[facility] PHYSICAL MEDICINE & REHABILITATION     PROGRESS NOTE  Subjective/Complaints:  Pt seen laying in bed this AM.  He slept well overnight.  He had a good day in therapies yesterday.   ROS: Denies CP, SOB, N/V/D.  Objective: Vital Signs: Blood pressure (!) 145/78, pulse 69, temperature 97.8 F (36.6 C), temperature source Axillary, resp. rate 18, height 6\' 3"  (1.905 m), weight 97.9 kg (215 lb 12.2 oz), SpO2 100 %. No results found.  Recent Labs  06/04/17 2036 06/05/17 0440  WBC 8.7 7.9  HGB 7.6* 7.4*  HCT 25.7* 24.9*  PLT 274 244    Recent Labs  06/04/17 2036 06/05/17 0440  NA  --  139  K  --  4.5  CL  --  109  GLUCOSE  --  129*  BUN  --  54*  CREATININE 3.80* 3.93*  CALCIUM  --  8.4*   CBG (last 3)   Recent Labs  06/05/17 1643 06/05/17 2105 06/06/17 0631  GLUCAP 91 112* 90    Wt Readings from Last 3 Encounters:  06/04/17 97.9 kg (215 lb 12.2 oz)  05/30/17 98 kg (216 lb)  05/10/17 98.1 kg (216 lb 3.2 oz)    Physical Exam:  BP (!) 145/78 (BP Location: Right Arm)   Pulse 69   Temp 97.8 F (36.6 C) (Axillary)   Resp 18   Ht 6\' 3"  (1.905 m)   Wt 97.9 kg (215 lb 12.2 oz)   SpO2 100%   BMI 26.97 kg/m  Constitutional: He appears well-developed and well-nourished.  HENT: Normocephalic and atraumatic.  Eyes: EOM are normal. No discharge.  Cardiovascular: RRR. No JVD  Respiratory: Effort normal and breath sounds normal. GI: Soft. Bowel sounds are normal.  Musculoskeletal: He exhibits edema and tenderness.  Left BKA Right transmet amputation  Neurological: He is alert and oriented to person, place, and time.  Motor: 5/5 throughout, except Left HF (4+/5 - pain inhibition, unchanged)  Skin: Skin is warm and dry.  BKA dressing c/d/i  Psychiatric: He has a normal mood and affect. His behavior is normal. Thought content normal.    Assessment/Plan: 1. 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 Physiatrist is providing close team supervision and 24 hour management of active medical problems listed below. Physiatrist and rehab team continue to assess barriers to discharge/monitor patient progress toward functional and medical goals.  Function:  Bathing Bathing position   Position: Wheelchair/chair at sink  Bathing parts Body parts bathed by patient: Right arm, Left arm, Chest, Abdomen, Right upper leg, Left upper leg, Right lower leg, Front perineal area, Buttocks Body parts bathed by helper: Back  Bathing assist Assist Level: Touching or steadying assistance(Pt > 75%)      Upper Body Dressing/Undressing Upper body dressing   What is the patient wearing?: Pull over shirt/dress     Pull over shirt/dress - Perfomed by patient: Thread/unthread right sleeve, Thread/unthread left sleeve, Put head through opening, Pull shirt over trunk          Upper body assist Assist Level: Supervision or verbal cues      Lower Body Dressing/Undressing Lower body dressing   What is the patient wearing?: Underwear, Pants, Socks, Shoes Underwear - Performed by patient: Thread/unthread right underwear leg, Thread/unthread left underwear leg, Pull underwear up/down   Pants- Performed by patient: Thread/unthread  right pants leg, Thread/unthread left pants leg, Pull pants up/down       Socks - Performed by patient: Don/doff right sock (left sock NA)                Lower body assist        Toileting Toileting Toileting activity did not occur: No continent bowel/bladder event Toileting steps completed by patient: Adjust clothing prior to toileting, Performs perineal hygiene, Adjust clothing after toileting   Toileting Assistive Devices: Other (comment), Grab bar or rail (walker)  Toileting assist Assist level: Supervision or verbal cues    Transfers Chair/bed transfer   Chair/bed transfer method: Stand pivot, Ambulatory Chair/bed transfer assist level: Touching or steadying assistance (Pt > 75%) Chair/bed transfer assistive device: Armrests, Medical sales representative     Max distance: 54 Assist level: Touching or steadying assistance (Pt > 75%)   Wheelchair          Cognition Comprehension Comprehension assist level: Follows basic conversation/direction with no assist  Expression Expression assist level: Expresses basic needs/ideas: With no assist  Social Interaction Social Interaction assist level: Interacts appropriately with others - No medications needed.  Problem Solving Problem solving assist level: Solves basic problems with no assist  Memory Memory assist level: Complete Independence: No helper    Medical Problem List and Plan: 1.  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  DVT Prophylaxis/Anticoagulation: Subcutaneous Lovenox. Monitor platelet counts and any signs of bleeding 3. Pain Management: Neurontin 300 mg daily at bedtime, oxycodone as needed 4. Mood: Provide emotional support 5. Neuropsych: This patient is capable of making decisions on his own behalf. 6. Skin/Wound Care: Routine skin checks 7. Fluids/Electrolytes/Nutrition: Routine I&O's  8. Acute blood loss anemia.   Hb 7.4 on 7/3  Cont to monitor 9. CKD stage IV. Baseline creatinine 3.85-4.28.   Cr. 3.93 on 7/3  Cont to monitor 10. Hypertension. Norvasc 10 mg daily, Coreg 25 mg twice a day  Monitor with increased mobility  Improving, cont to monitor 11. Diabetes mellitus with peripheral neuropathy. Hemoglobin A1c 6.9. Lantus insulin 12 units twice a day. Check blood sugars before meals and at bedtime. Diabetic teaching  Monitor with increased mobility  Improving, cont to monitor 12. Hyperlipidemia. Lipitor 13.  Constipation. Laxative assistance  LOS (Days) 2 A FACE TO FACE EVALUATION WAS PERFORMED  Ankit Lorie Phenix 06/06/2017 8:26 AM

## 2017-06-06 NOTE — Progress Notes (Signed)
Social Work Patient ID: [name], male   DOB: 10/05/1961, 56 y.o.   MRN: [id number]  Met with pt to discuss team conference goals mod/i level and discharge target of 7/10. He feels this will give him enough time to Reach the goals he has set for himself. He does have a rolling walker & wheelchair at home already. Will work on discharge needs and encouraged him to have wife come in and attend therapies with him prior to Discharge.

## 2017-06-06 NOTE — Progress Notes (Signed)
Occupational Therapy Session Note  Patient Details  Name: [name] MRN: [id number] Date of Birth: 1961-04-03  Today's Date: 06/06/2017 OT Individual Time: 1747-1595 OT Individual Time Calculation (min): 57 min    Short Term Goals: Week 1:  OT Short Term Goal 1 (Week 1): STGs equal to LTGs secondary to ELOS  Skilled Therapeutic Interventions/Progress Updates:    Pt transferred supine to sit EOB with modified independent level.  Supervision for sit to stand and functional transfers with the RW with supervision to gather items for bathing and dressing as well.  He was able to transition to the sink for grooming and bathing tasks sit to stand.  Supervision for all bathing and dressing at the sink secondary to PICC line.  Pt left in wheelchair with call button and phone in reach.  Therapy Documentation Precautions:  Precautions Precautions: Fall Precaution Comments: L residual limb with plastic KI guard  Required Braces or Orthoses: Other Brace/Splint Other Brace/Splint: L plastic KI guard, R AFO  Restrictions Weight Bearing Restrictions: Yes [name] Weight Bearing: Non weight bearing   Pain: Pain Assessment Pain Assessment: Faces Pain Score: 7  Faces Pain Scale: Hurts a little bit Pain Type: Surgical pain Pain Location: Leg Pain Descriptors / Indicators: Discomfort Pain Onset: With Activity Pain Intervention(s): Repositioned ADL: See Function Navigator for Current Functional Status.   Therapy/Group: Individual Therapy  [name] OTR/L 06/06/2017, 10:17 AM

## 2017-06-07 ENCOUNTER — Inpatient Hospital Stay (HOSPITAL_COMMUNITY): Payer: BLUE CROSS/BLUE SHIELD | Admitting: Physical Therapy

## 2017-06-07 ENCOUNTER — Encounter (HOSPITAL_COMMUNITY): Payer: Self-pay

## 2017-06-07 ENCOUNTER — Inpatient Hospital Stay (HOSPITAL_COMMUNITY): Payer: Medicare Other | Admitting: Occupational Therapy

## 2017-06-07 LAB — GLUCOSE, CAPILLARY
GLUCOSE-CAPILLARY: 119 mg/dL — AB (ref 65–99)
Glucose-Capillary: 99 mg/dL (ref 65–99)
Glucose-Capillary: 110 mg/dL — ABNORMAL HIGH (ref 65–99)
Glucose-Capillary: 170 mg/dL — ABNORMAL HIGH (ref 65–99)

## 2017-06-07 NOTE — Progress Notes (Signed)
Occupational Therapy Session Note  Patient Details  Name: [name] MRN: [id number] Date of Birth: [birth year]  Today's Date: 06/07/2017 OT Individual Time: 8786-7672 OT Individual Time Calculation (min): 74 min    Short Term Goals: Week 1:  OT Short Term Goal 1 (Week 1): STGs equal to LTGs secondary to ELOS  Skilled Therapeutic Interventions/Progress Updates:    Pt completed functional mobility from his room out into the hallway with supervision using the RW.  He fatigued quickly and needed to sit down and finish mobility from wheelchair to the dayroom after short rest break.  He was able to practice transfers to the regular bed with supervision using the RW.  Had him practice wheelchair mobility in the kitchen for simple meal prep as well.  He was able to use the wheelchair to place items back in specific locations with modified independence.  Returned to room via wheelchair at end of session secondary to fatigue.  Pt completed transfer squat pivot to the bedside chair with supervision to conclude session.  Nursing present to administer meds at end of session.  Call button and phone in reach.    Therapy Documentation Precautions:  Precautions Precautions: Fall Precaution Comments: L residual limb with plastic KI guard  Required Braces or Orthoses: Other Brace/Splint Other Brace/Splint: L plastic KI guard, R AFO  Restrictions Weight Bearing Restrictions: No [name] Weight Bearing: Non weight bearing  Pain: Pain Assessment Pain Assessment: Faces Faces Pain Scale: Hurts whole lot Pain Type: Acute pain Pain Location: Leg Pain Orientation: Left Pain Descriptors / Indicators: Discomfort Pain Onset: With Activity Pain Intervention(s): Repositioned ADL: See Function Navigator for Current Functional Status.   Therapy/Group: Individual Therapy  Aneli Zara OTR/L 06/07/2017, 3:59 PM

## 2017-06-07 NOTE — Progress Notes (Signed)
[facility] PHYSICAL MEDICINE & REHABILITATION     PROGRESS NOTE  Subjective/Complaints:  Pt seen laying in bed this AM.  He slept well overnight.  He is enjoying following [employer] free [employer].  ROS: Denies CP, SOB, N/V/D.  Objective: Vital Signs: Blood pressure 138/70, pulse 65, temperature 98.7 F (37.1 C), temperature source Oral, resp. rate 18, height 6\' 3"  (1.905 m), weight 97.9 kg (215 lb 12.2 oz), SpO2 99 %. No results found.  Recent Labs  06/04/17 2036 06/05/17 0440  WBC 8.7 7.9  HGB 7.6* 7.4*  HCT 25.7* 24.9*  PLT 274 244    Recent Labs  06/04/17 2036 06/05/17 0440  NA  --  139  K  --  4.5  CL  --  109  GLUCOSE  --  129*  BUN  --  54*  CREATININE 3.80* 3.93*  CALCIUM  --  8.4*   CBG (last 3)   Recent Labs  06/06/17 1656 06/06/17 2128 06/07/17 0640  GLUCAP 93 130* 119*    Wt Readings from Last 3 Encounters:  06/04/17 97.9 kg (215 lb 12.2 oz)  05/30/17 98 kg (216 lb)  05/10/17 98.1 kg (216 lb 3.2 oz)    Physical Exam:  BP 138/70 (BP Location: Right Arm)   Pulse 65   Temp 98.7 F (37.1 C) (Oral)   Resp 18   Ht 6\' 3"  (1.905 m)   Wt 97.9 kg (215 lb 12.2 oz)   SpO2 99%   BMI 26.97 kg/m  Constitutional: He appears well-developed and well-nourished.  HENT: Normocephalic and atraumatic.  Eyes: EOM are normal. No discharge.  Cardiovascular: RRR. No JVD  Respiratory: Effort normal and breath sounds normal. GI: Soft. Bowel sounds are normal.  Musculoskeletal: He exhibits edema and tenderness.  Left BKA Right transmet amputation  Neurological: He is alert and oriented to person, place, and time.  Motor: 5/5 throughout, except Left HF (4+/5 - pain inhibition, stable)  Skin: Skin is warm and dry.  BKA with dried blood and small central opening Psychiatric: He has a normal mood and affect. His behavior is normal. Thought content normal.    Assessment/Plan: 1. 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 Physiatrist is providing close team supervision and 24 hour management of active medical problems listed below. Physiatrist and rehab team continue to assess barriers to discharge/monitor patient progress toward functional and medical goals.  Function:  Bathing Bathing position   Position: Wheelchair/chair at sink  Bathing parts Body parts bathed by patient: Right arm, Left arm, Chest, Abdomen, Right upper leg, Left upper leg, Right lower leg, Front perineal area, Buttocks Body parts bathed by helper: Back  Bathing assist Assist Level: Touching or steadying assistance(Pt > 75%)      Upper Body Dressing/Undressing Upper body dressing   What is the patient wearing?: Pull over shirt/dress     Pull over shirt/dress - Perfomed by patient: Thread/unthread right sleeve, Thread/unthread left sleeve, Put head through opening, Pull shirt over trunk          Upper body assist Assist Level: Supervision or verbal cues      Lower Body Dressing/Undressing Lower body dressing   What is the patient wearing?: Underwear, Pants, Socks, Shoes, AFO Underwear - Performed by patient: Thread/unthread right underwear leg, Thread/unthread left underwear leg, Pull underwear up/down   Pants- Performed by patient:  Thread/unthread right pants leg, Thread/unthread left pants leg, Pull pants up/down       Socks - Performed by patient: Don/doff right sock   Shoes - Performed by patient: Don/doff right shoe     AFO - Performed by helper: Don/doff right AFO      Lower body assist        Toileting Toileting   Toileting steps completed by patient: Adjust clothing prior to toileting, Performs perineal hygiene, Adjust clothing after toileting   Toileting Assistive Devices: Grab bar or rail, Other (comment) (walker)  Toileting assist Assist level: Set up/obtain supplies,  Supervision or verbal cues   Transfers Chair/bed transfer   Chair/bed transfer method: Ambulatory Chair/bed transfer assist level: Supervision or verbal cues Chair/bed transfer assistive device: Armrests, Medical sales representative     Max distance: 54 Assist level: Touching or steadying assistance (Pt > 75%)   Wheelchair          Cognition Comprehension Comprehension assist level: Follows basic conversation/direction with no assist  Expression Expression assist level: Expresses basic needs/ideas: With no assist  Social Interaction Social Interaction assist level: Interacts appropriately with others - No medications needed.  Problem Solving Problem solving assist level: Solves basic problems with no assist  Memory Memory assist level: Complete Independence: No helper    Medical Problem List and Plan: 1.  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  DVT Prophylaxis/Anticoagulation: Subcutaneous Lovenox. Monitor platelet counts and any signs of bleeding 3. Pain Management: Neurontin 300 mg daily at bedtime, oxycodone as needed 4. Mood: Provide emotional support 5. Neuropsych: This patient is capable of making decisions on his own behalf. 6. Skin/Wound Care: Routine skin checks 7. Fluids/Electrolytes/Nutrition: Routine I&O's  8. Acute blood loss anemia.   Hb 7.4 on 7/3  Cont to monitor 9. CKD stage IV. Baseline creatinine 3.85-4.28.   Cr. 3.93 on 7/3  Cont to monitor 10. Hypertension. Norvasc 10 mg daily, Coreg 25 mg twice a day  Monitor with increased mobility  Overall controlled 7/5 11. Diabetes mellitus with peripheral neuropathy. Hemoglobin A1c 6.9. Lantus insulin 12 units twice a day. Check blood sugars before meals and at bedtime. Diabetic teaching  Monitor with increased mobility  Overall controlled 7/5 12. Hyperlipidemia. Lipitor 13. Constipation.  Laxative assistance  LOS (Days) 3 A FACE TO FACE EVALUATION WAS PERFORMED  Ankit Lorie Phenix 06/07/2017 8:29 AM

## 2017-06-07 NOTE — Progress Notes (Signed)
Physical Therapy Session Note  Patient Details  Name: [name] MRN: [id number] Date of Birth: Apr 29, 1961  Today's Date: 06/07/2017 PT Individual Time: 0900-1000 PT Individual Time Calculation (min): 60 min   Short Term Goals: Week 1:  PT Short Term Goal 1 (Week 1): =LTGs due to ELOS  Skilled Therapeutic Interventions/Progress Updates:    PT treatment focused on core and LE exercises for strengthening and activity tolerance with ambulation.  Pt awake lying supine in bed and agreeable to PT treatment session. Pt requested putting an ACE wrap under the most proximal portion of the extension brace to keep it from "pinching" his skin. Pt performed squat pivot transfer from bed to w/c with min guard A. Pt propelled w/c from room to the therapy gym with min verbal cues for navigation. Pt ambulated 172ft with RW and min guard A. Pt performed prone lying L LE hip extension and abduction 2x15 each for strengthening and endurance. Pt performed modified sit ups lying on a wedge 2x15 and R side lying planks on R bent knee 5x10second hold to work on core strength and endurance. Pt performed sit to stand with R LE x10 using B UEs for balance only to work on R LE strengthening. Pt ambulated 43ft with RW and min guard A towards his room then performed w/c mobility to room with supervision. Pt transferred from w/c to bed with min guard A and was left supine in bed with call bell in reach.   Therapy Documentation Precautions:  Precautions Precautions: Fall Precaution Comments: L residual limb with plastic KI guard  Required Braces or Orthoses: Other Brace/Splint Other Brace/Splint: L plastic KI guard, R AFO  Restrictions Weight Bearing Restrictions: Yes [name] Weight Bearing: Non weight bearing   See Function Navigator for Current Functional Status.   Therapy/Group: Individual Therapy  Perl Folmar 06/07/2017, 12:28 PM

## 2017-06-07 NOTE — Progress Notes (Signed)
Occupational Therapy Session Note  Patient Details  Name: [name] MRN: [id number] Date of Birth: Nov 12, 1961  Today's Date: 06/07/2017 OT Individual Time: 1105-1200 OT Individual Time Calculation (min): 55 min    Short Term Goals: Week 1:  OT Short Term Goal 1 (Week 1): STGs equal to LTGs secondary to ELOS  Skilled Therapeutic Interventions/Progress Updates:    Upon entering the room, pt seated in bed awaiting therapist with no c/o pain this session. Pt needing cues multiple times throughout session to stay off of cell phone. Pt transferred from bed >wheelchair with steady assistance. Pt managed leg rest and amputee pad with increased time and min cues for technique. Pt propelled wheelchair 150' to dayroom with supervision and increased time. Pt ambulating 15' with RW and min A for furniture transfer onto low sofa similar to home environment. Pt performed transfer with min A for balance when standing from low surface. Pt returned to wheelchair and OT provided paper handout for B UE strengthening exercise. OT provided demonstrations of each exercises with use of blue level 4 band. Pt returned demonstrations 10 reps with min verbal cues for proper technique. Pt returned to room in same manner as above. Call bell and all needed items within reach.   Therapy Documentation Precautions:  Precautions Precautions: Fall Precaution Comments: L residual limb with plastic KI guard  Required Braces or Orthoses: Other Brace/Splint Other Brace/Splint: L plastic KI guard, R AFO  Restrictions Weight Bearing Restrictions: Yes [name] Weight Bearing: Non weight bearing General:   Vital Signs:   Pain: Pain Assessment Pain Assessment: 0-10 Pain Score: 8  Pain Type: Acute pain Pain Location: Leg Pain Orientation: Left Pain Descriptors / Indicators: Discomfort Pain Frequency: Intermittent Pain Onset: On-going Pain Intervention(s): Medication (See eMAR)  See Function Navigator for Current  Functional Status.   Therapy/Group: Individual Therapy  Gypsy Decant 06/07/2017, 12:40 PM

## 2017-06-08 ENCOUNTER — Inpatient Hospital Stay (HOSPITAL_COMMUNITY): Payer: BLUE CROSS/BLUE SHIELD | Admitting: Physical Therapy

## 2017-06-08 ENCOUNTER — Encounter (HOSPITAL_COMMUNITY): Payer: Self-pay

## 2017-06-08 ENCOUNTER — Inpatient Hospital Stay (HOSPITAL_COMMUNITY): Payer: Medicare Other | Admitting: Occupational Therapy

## 2017-06-08 LAB — GLUCOSE, CAPILLARY
GLUCOSE-CAPILLARY: 111 mg/dL — AB (ref 65–99)
Glucose-Capillary: 127 mg/dL — ABNORMAL HIGH (ref 65–99)
Glucose-Capillary: 112 mg/dL — ABNORMAL HIGH (ref 65–99)
Glucose-Capillary: 122 mg/dL — ABNORMAL HIGH (ref 65–99)

## 2017-06-08 NOTE — Progress Notes (Signed)
[name] PHYSICAL MEDICINE & REHABILITATION     PROGRESS NOTE  Subjective/Complaints:  Pt seen laying in bed this AM.  He slept well overnight.  He denies complaints this AM.   ROS: Denies CP, SOB, N/V/D.  Objective: Vital Signs: Blood pressure 138/71, pulse 68, temperature 98.3 F (36.8 C), temperature source Oral, resp. rate 16, height 6\' 3"  (1.905 m), weight 97.9 kg (215 lb 12.2 oz), SpO2 98 %. No results found. No results for input(s): WBC, HGB, HCT, PLT in the last 72 hours. No results for input(s): NA, K, CL, GLUCOSE, BUN, CREATININE, CALCIUM in the last 72 hours.  Invalid input(s): CO CBG (last 3)   Recent Labs  06/07/17 1638 06/07/17 2102 06/08/17 0627  GLUCAP 110* 170* 127*    Wt Readings from Last 3 Encounters:  06/04/17 97.9 kg (215 lb 12.2 oz)  05/30/17 98 kg (216 lb)  05/10/17 98.1 kg (216 lb 3.2 oz)    Physical Exam:  BP 138/71 (BP Location: Right Arm)   Pulse 68   Temp 98.3 F (36.8 C) (Oral)   Resp 16   Ht 6\' 3"  (1.905 m)   Wt 97.9 kg (215 lb 12.2 oz)   SpO2 98%   BMI 26.97 kg/m  Constitutional: He appears well-developed and well-nourished.  HENT: Normocephalic and atraumatic.  Eyes: EOM are normal. No discharge.  Cardiovascular: RRR. No JVD  Respiratory: Effort normal and breath sounds normal. GI: Soft. Bowel sounds are normal.  Musculoskeletal: He exhibits edema and tenderness.  Left BKA Right transmet amputation  Neurological: He is alert and oriented to person, place, and time.  Motor: 5/5 throughout, except Left HF (4+/5 - pain inhibition, stable)  Skin: Skin is warm and dry.  BKA with dried blood and small central opening, stable Psychiatric: He has a normal mood and affect. His behavior is normal. Thought content normal.    Assessment/Plan: 1. 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 Physiatrist is providing close team supervision and 24 hour management of active medical problems listed below. Physiatrist and rehab team continue to assess barriers to discharge/monitor patient progress toward functional and medical goals.  Function:  Bathing Bathing position   Position: Wheelchair/chair at sink  Bathing parts Body parts bathed by patient: Right arm, Left arm, Chest, Abdomen, Right upper leg, Left upper leg, Right lower leg, Front perineal area, Buttocks Body parts bathed by helper: Back  Bathing assist Assist Level: Touching or steadying assistance(Pt > 75%)      Upper Body Dressing/Undressing Upper body dressing   What is the patient wearing?: Pull over shirt/dress     Pull over shirt/dress - Perfomed by patient: Thread/unthread right sleeve, Thread/unthread left sleeve, Put head through opening, Pull shirt over trunk          Upper body assist Assist Level: Supervision or verbal cues      Lower Body Dressing/Undressing Lower body dressing   What is the patient wearing?: Underwear, Pants, Socks, Shoes, AFO Underwear - Performed by patient: Thread/unthread right underwear leg, Thread/unthread left underwear leg, Pull underwear up/down   Pants- Performed by patient: Thread/unthread right pants leg, Thread/unthread left pants leg, Pull pants up/down       Socks - Performed by patient: Don/doff right sock   Shoes - Performed by patient: Don/doff right shoe     AFO - Performed by  helper: Don/doff right AFO      Lower body assist        Toileting Toileting   Toileting steps completed by patient: Adjust clothing prior to toileting, Performs perineal hygiene, Adjust clothing after toileting   Toileting Assistive Devices:  Gilford Rile)  Toileting assist Assist level: Set up/obtain supplies, Supervision or verbal cues   Transfers Chair/bed transfer   Chair/bed transfer method: Ambulatory Chair/bed transfer assist  level: Supervision or verbal cues Chair/bed transfer assistive device: Walker, Air cabin crew     Max distance:  (100 ft) Assist level: Supervision or verbal cues   Wheelchair   Type: Manual Max wheelchair distance:  (150 ft) Assist Level: Supervision or verbal cues  Cognition Comprehension Comprehension assist level: Follows basic conversation/direction with no assist  Expression Expression assist level: Expresses basic needs/ideas: With no assist  Social Interaction Social Interaction assist level: Interacts appropriately with others - No medications needed.  Problem Solving Problem solving assist level: Solves basic problems with no assist  Memory Memory assist level: Complete Independence: No helper    Medical Problem List and Plan: 1.  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  DVT Prophylaxis/Anticoagulation: Subcutaneous Lovenox. Monitor platelet counts and any signs of bleeding 3. Pain Management: Neurontin 300 mg daily at bedtime, oxycodone as needed 4. Mood: Provide emotional support 5. Neuropsych: This patient is capable of making decisions on his own behalf. 6. Skin/Wound Care: Routine skin checks 7. Fluids/Electrolytes/Nutrition: Routine I&O's  8. Acute blood loss anemia.   Hb 7.4 on 7/3  Labs ordered for Monday  Cont to monitor 9. CKD stage IV. Baseline creatinine 3.85-4.28.   Cr. 3.93 on 7/3  Cont to monitor  Labs ordered for Monday 10. Hypertension. Norvasc 10 mg daily, Coreg 25 mg twice a day  Monitor with increased mobility  Overall controlled 7/6 11. Diabetes mellitus with peripheral neuropathy. Hemoglobin A1c 6.9. Lantus insulin 12 units twice a day. Check blood sugars before meals and at bedtime. Diabetic teaching  Monitor with increased mobility  Overall controlled 7/6 12. Hyperlipidemia. Lipitor 13. Constipation. Laxative  assistance  LOS (Days) 4 A FACE TO FACE EVALUATION WAS PERFORMED  Ankit Lorie Phenix 06/08/2017 9:58 AM

## 2017-06-08 NOTE — Progress Notes (Signed)
Occupational Therapy Session Note  Patient Details  Name: [name] MRN: [id number] Date of Birth: [birth year]  Today's Date: 06/08/2017 OT Individual Time: 1000-1103 OT Individual Time Calculation (min): 63 min    Short Term Goals: Week 1:  OT Short Term Goal 1 (Week 1): STGs equal to LTGs secondary to ELOS  Skilled Therapeutic Interventions/Progress Updates:    therapist applied foam padding to top of Limbguard brace as pt stated it was rubbing him.  Next had pt ambulate out of the room to the wheelchair in the hallway with use of the RW and close supervision.  He then propelled the wheelchair down to the therapy gym with supervision in order to complete UE exercises with use of the ergonometer.  He was able to complete one interval of 6 mins and one interval of 4 mins with resistance set on level 12.  Transitioned to therapy mat for next part of session with focus on sustained standing balance with use of the the RW for support.  Min guard assist for balance while releasing one UE for activity and then alternating to use the other.  Transferred back to room via wheelchair at end of session and then to bed with supervision squat pivot and no assistive device.  Call button and phone in reach.    Therapy Documentation Precautions:  Precautions Precautions: Fall Precaution Comments: L residual limb with plastic KI guard  Required Braces or Orthoses: Other Brace/Splint Other Brace/Splint: L plastic KI guard, R AFO  Restrictions Weight Bearing Restrictions: No [name] Weight Bearing: Non weight bearing  Pain: Pain Assessment Pain Assessment: Faces Pain Score: 7  Faces Pain Scale: Hurts a little bit Pain Type: Surgical pain;Acute pain Pain Location: Leg Pain Orientation: Left Pain Intervention(s): Medication (See eMAR) ADL: See Function Navigator for Current Functional Status.   Therapy/Group: Individual Therapy  Takiera Mayo OTR/L 06/08/2017, 12:24 PM

## 2017-06-08 NOTE — Progress Notes (Signed)
Physical Therapy Session Note  Patient Details  Name: [name] MRN: [id number] Date of Birth: Feb 21, 1961  Today's Date: 06/08/2017 PT Individual Time: 0806-0859 PT Individual Time Calculation (min): 53 min    Skilled Therapeutic Interventions/Progress Updates:    Session 1:  [phone]:  Session focused on improving static standing, endurance, and strength for improved safety with mobility in preparation for D/C home.  Pt ambulated 1 x 100 ft with RW, 2 x 50 ft with RW.  Pt additionally performed standing balance while folding washclothes with min a for safety.  Pt additionally propelled manual w/c x > 150 ft with supervision only with rest breaks due to fatigue.  Vitals pre:  156/80.  Pain: 8/10 but just had medications.  Precautions maintained t/o session.  Session 2:  [id number]:  Session initiated with pt lying supine in bed.  Reports 8/10 pain in L LE, however, just had pain meds.  Session again focused again on improving standing balance while performing UE tasks of folding washclothes, UE and LE endurance and strength throught the use of UBE x 8 min, supine there ex:  SLR and supine supine hip abd/add for 2 x 15 each, wheelchair propulsion x approx 250 ft with supervision with numerous rest breaks.  Pt additionally ambulated 1 x 75 ft with RW and 2 x 25 ft to improve safety and endurance with ambulation for D/C to home environment.  Pt additionally performed static standing balance while playing blackjack with another patient.  Transferred to toilet with supervision to min assist.  Nursing notified of pt location.  Precautions maintained t/o session today.  Therapy Documentation Precautions:  Precautions Precautions: Fall Precaution Comments: L residual limb with plastic KI guard  Required Braces or Orthoses: Other Brace/Splint Other Brace/Splint: L plastic KI guard, R AFO  Restrictions Weight Bearing Restrictions: No [name] Weight Bearing: Non weight bearing   See Function  Navigator for Current Functional Status.   Therapy/Group: Individual Therapy  Rey Fors Hilario Quarry 06/08/2017, 12:36 PM

## 2017-06-09 ENCOUNTER — Inpatient Hospital Stay (HOSPITAL_COMMUNITY): Payer: BLUE CROSS/BLUE SHIELD | Admitting: Physical Therapy

## 2017-06-09 ENCOUNTER — Inpatient Hospital Stay (HOSPITAL_COMMUNITY): Payer: BLUE CROSS/BLUE SHIELD | Admitting: Occupational Therapy

## 2017-06-09 LAB — GLUCOSE, CAPILLARY
GLUCOSE-CAPILLARY: 99 mg/dL (ref 65–99)
Glucose-Capillary: 134 mg/dL — ABNORMAL HIGH (ref 65–99)
Glucose-Capillary: 111 mg/dL — ABNORMAL HIGH (ref 65–99)
Glucose-Capillary: 109 mg/dL — ABNORMAL HIGH (ref 65–99)

## 2017-06-09 NOTE — Progress Notes (Signed)
Drowsy tonight. Patient without complaint of. Reports feeling "tired". Last PRN Oxy IR given at 1612 today. Limb guard in place, to Left BKA, reports dressing changed today? Left AVG + +. Patrici Ranks A

## 2017-06-09 NOTE — Progress Notes (Signed)
Occupational Therapy Session Note  Patient Details  Name: [name] MRN: [id number] Date of Birth: November 15, 1961  Today's Date: 06/09/2017 OT Individual Time: 1018-1100 OT Individual Time Calculation (min): 42 min    Short Term Goals: Week 1:  OT Short Term Goal 1 (Week 1): STGs equal to LTGs secondary to ELOS     Skilled Therapeutic Interventions/Progress Updates:    Tx focus on dynamic balance and functional ambulation during IADL tasks.   Pt greeted supine in bed. Close supervision for squat pivot<w/c. Pt self propelled down hallway for UB strengthening to therapy apartment. Discussed kitchen setup at home and DME accessibility. Per pt, it will accommodate a walker. Pt provided with makeshift walker bag due to inavailability of walker bags in supply. Practiced hopping with RW to retrieve items from fridge and low/high cupboards, using walker bag for item transportation. Instructed him on energy conservation techniques and appropriate kitchen modifications to implement. Openly discussed grocery shopping plans at d/c. Pt initially expressing that he planned to drive to store and shop himself. Educated him on checking with MD regarding possible driving restrictions. Also went over having supportive friends/daughters assist with grocery shopping at this time, and possibly going with them (but to not shop alone at d/c). Pt verbalized understanding. At end of tx pt self propelled back to room, transferred back to bed in manner as written above, and was left with all needs at time of departure.   All functional ambulation today completed at close supervision level   Therapy Documentation Precautions:  Precautions Precautions: Fall Precaution Comments: L residual limb with plastic KI guard  Required Braces or Orthoses: Other Brace/Splint Other Brace/Splint: L plastic KI guard, R AFO  Restrictions Weight Bearing Restrictions: No [name] Weight Bearing: Non weight bearing  Pain: No c/o pain  during tx    ADL:  :    See Function Navigator for Current Functional Status.   Therapy/Group: Individual Therapy  Ixel Boehning A Dewayne Jurek 06/09/2017, 12:40 PM

## 2017-06-09 NOTE — Plan of Care (Signed)
Problem: RH PAIN MANAGEMENT Goal: RH STG PAIN MANAGED AT OR BELOW PT'S PAIN GOAL 4 or less  Outcome: Not Progressing Consistently rates pain > 4

## 2017-06-09 NOTE — Progress Notes (Signed)
Physical Therapy Session Note  Patient Details  Name: ABDIKADIR FOHL MRN: [id number] Date of Birth: 04-05-1961  Today's Date: 06/09/2017 PT Individual Time: 0802-0935 PT Individual Time Calculation (min): 93 min    Skilled Therapeutic Interventions/Progress Updates:    Session focused on improving R sided single limb balance to assist in improving functional independence, education on home program, education on safety with transfers, gait, and stairs, and endurance with w/c propulsion and also UBE x 10 minutes.  Pt stood with UE support prn while playing blackjack.  Pt performed LE mat exercise consisting of prone lying with attempting hip extension x 1 x 5 b/l, supine SLR for 3 x 15 and supine hip abduction/adduction for 3 x 15.  Pt propelled manual w/c > 250 ft total and performed UBE x 10 minutes to address endurance deficits.  With stand pivot sit transfers, pt demos decreased safety without use of walker, however, with walker, requires only supervision.  Following session, pt was returned to room and left up in chair with call bell in reach and needs met.  Precautions maintained t/o session.  Therapy Documentation Precautions:  Precautions Precautions: Fall Precaution Comments: L residual limb with plastic KI guard  Required Braces or Orthoses: Other Brace/Splint Other Brace/Splint: L plastic KI guard, R AFO  Restrictions Weight Bearing Restrictions: No [name] Weight Bearing: Non weight bearing   See Function Navigator for Current Functional Status.   Therapy/Group: Individual Therapy  Taleigh Gero Hilario Quarry 06/09/2017, 12:14 PM

## 2017-06-09 NOTE — Progress Notes (Signed)
[facility] PHYSICAL MEDICINE & REHABILITATION     PROGRESS NOTE  Subjective/Complaints:  No new complaints. States pain medications are controlling pain.   ROS: pt denies nausea, vomiting, diarrhea, cough, shortness of breath or chest pain   Objective: Vital Signs: Blood pressure (!) 144/66, pulse 70, temperature 98.1 F (36.7 C), temperature source Oral, resp. rate 17, height 6\' 3"  (1.905 m), weight 97.9 kg (215 lb 12.2 oz), SpO2 99 %. No results found. No results for input(s): WBC, HGB, HCT, PLT in the last 72 hours. No results for input(s): NA, K, CL, GLUCOSE, BUN, CREATININE, CALCIUM in the last 72 hours.  Invalid input(s): CO CBG (last 3)   Recent Labs  06/08/17 1635 06/08/17 2054 06/09/17 0633  GLUCAP 112* 122* 134*    Wt Readings from Last 3 Encounters:  06/04/17 97.9 kg (215 lb 12.2 oz)  05/30/17 98 kg (216 lb)  05/10/17 98.1 kg (216 lb 3.2 oz)    Physical Exam:  BP (!) 144/66 (BP Location: Right Arm)   Pulse 70   Temp 98.1 F (36.7 C) (Oral)   Resp 17   Ht 6\' 3"  (1.905 m)   Wt 97.9 kg (215 lb 12.2 oz)   SpO2 99%   BMI 26.97 kg/m  Constitutional: He appears well-developed and well-nourished.  HENT: Normocephalic and atraumatic.  Eyes: EOM are normal. No discharge.  Cardiovascular: RRR without murmur. No JVD   Respiratory: CTA Bilaterally without wheezes or rales. Normal effort  GI: Soft. Bowel sounds are normal.  Musculoskeletal: He exhibits edema and tenderness.  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  Motor: 5/5 throughout, except Left HF (4+/5 - pain inhibition, stable)  Skin: Skin is warm and dry.  BKA with dried blood and small central opening, stable Psychiatric: He has a normal mood and affect. His behavior is normal. Thought content normal.    Assessment/Plan: 1. 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 Physiatrist is providing close team supervision and 24 hour management of active medical problems listed below. Physiatrist and rehab team continue to assess barriers to discharge/monitor patient progress toward functional and medical goals.  Function:  Bathing Bathing position   Position: Wheelchair/chair at sink  Bathing parts Body parts bathed by patient: Right arm, Left arm, Chest, Abdomen, Right upper leg, Left upper leg, Right lower leg, Front perineal area, Buttocks Body parts bathed by helper: Back  Bathing assist Assist Level: Touching or steadying assistance(Pt > 75%)      Upper Body Dressing/Undressing Upper body dressing   What is the patient wearing?: Pull over shirt/dress     Pull over shirt/dress - Perfomed by patient: Thread/unthread right sleeve, Thread/unthread left sleeve, Put head through opening, Pull shirt over trunk          Upper body assist Assist Level: Supervision or verbal cues      Lower Body Dressing/Undressing Lower body dressing   What is the patient wearing?: Underwear, Pants, Socks, Shoes, AFO Underwear - Performed by patient: Thread/unthread right underwear leg, Thread/unthread left underwear leg, Pull underwear up/down   Pants- Performed by patient: Thread/unthread right pants leg, Thread/unthread left pants leg, Pull pants up/down       Socks - Performed by patient: Don/doff right sock   Shoes - Performed by patient:  Don/doff right shoe     AFO - Performed by helper: Don/doff right AFO      Lower body assist        Toileting Toileting Toileting activity did not occur:  (No BM, used urinal) Toileting steps completed by patient: Adjust clothing prior to toileting, Performs perineal hygiene, Adjust clothing after toileting   Toileting Assistive Devices:  Risk manager)  Toileting assist Assist level: Set  up/obtain supplies, Supervision or verbal cues   Transfers Chair/bed transfer   Chair/bed transfer method: Squat pivot Chair/bed transfer assist level: Touching or steadying assistance (Pt > 75%) Chair/bed transfer assistive device: Armrests     Locomotion Ambulation     Max distance:  (100 ft) Assist level: Supervision or verbal cues   Wheelchair   Type: Manual Max wheelchair distance:  (150 ft) Assist Level: Supervision or verbal cues  Cognition Comprehension Comprehension assist level: Follows basic conversation/direction with no assist  Expression Expression assist level: Expresses basic needs/ideas: With no assist  Social Interaction Social Interaction assist level: Interacts appropriately with others - No medications needed.  Problem Solving Problem solving assist level: Solves complex 90% of the time/cues < 10% of the time  Memory Memory assist level: Complete Independence: No helper    Medical Problem List and Plan: 1.  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  DVT Prophylaxis/Anticoagulation: Subcutaneous Lovenox. Monitor platelet counts and any signs of bleeding 3. Pain Management: Neurontin 300 mg daily at bedtime, oxycodone as needed 4. Mood: Provide emotional support 5. Neuropsych: This patient is capable of making decisions on his own behalf. 6. Skin/Wound Care: Routine skin checks 7. Fluids/Electrolytes/Nutrition: Routine I&O's  8. Acute blood loss anemia.   Hb 7.4 on 7/3  Labs ordered for Monday  Cont to monitor 9. CKD stage IV. Baseline creatinine 3.85-4.28.   Cr. 3.93 on 7/3  Cont to monitor  Labs ordered for Monday 10. Hypertension. Norvasc 10 mg daily, Coreg 25 mg twice a day  Monitor with increased mobility  Overall controlled 7/6 11. Diabetes mellitus with peripheral neuropathy. Hemoglobin A1c  6.9. Lantus insulin 12 units twice a day. Check blood sugars before meals and at bedtime. Diabetic teaching  Monitor with increased mobility  Overall controlled 7/6 12. Hyperlipidemia. Lipitor 13. Constipation. Laxative assistance  LOS (Days) 5 A FACE TO FACE EVALUATION WAS PERFORMED  [name] T 06/09/2017 9:16 AM

## 2017-06-09 NOTE — Progress Notes (Signed)
Pain managed with scheduled meds and PRN Oxy IR 10mg . Declined to take limb guard off to have BKA assessed. Left AVG-(+)(+). [name] A

## 2017-06-09 NOTE — Progress Notes (Signed)
Occupational Therapy Session Note  Patient Details  Name: [name] MRN: [id number] Date of Birth: 14-Nov-1961  Today's Date: 06/09/2017 OT Individual Time: 1018-1100 OT Individual Time Calculation (min): 42 min    Therapy Documentation    General OT Amount of Missed Time: 60 Minutes (60) Patient stated he was very fatigued and could not complete more therapy today.  Therapy will be offered next scheduled therapy session  Therapy/Group: Individual Therapy  Alfredia Ferguson [facility] 06/09/2017, 2:30 PM

## 2017-06-10 LAB — GLUCOSE, CAPILLARY
GLUCOSE-CAPILLARY: 133 mg/dL — AB (ref 65–99)
GLUCOSE-CAPILLARY: 144 mg/dL — AB (ref 65–99)
Glucose-Capillary: 82 mg/dL (ref 65–99)
Glucose-Capillary: 107 mg/dL — ABNORMAL HIGH (ref 65–99)

## 2017-06-10 NOTE — Progress Notes (Signed)
[place] PHYSICAL MEDICINE & REHABILITATION     PROGRESS NOTE  Subjective/Complaints:  Happy that he got a good night of sleep last night. Feels well this morning. Had questions about Limb guard  ROS: pt denies nausea, vomiting, diarrhea, cough, shortness of breath or chest pain   Objective: Vital Signs: Blood pressure 138/74, pulse 72, temperature 98.8 F (37.1 C), temperature source Oral, resp. rate 18, height 6\' 3"  (1.905 m), weight 97.9 kg (215 lb 12.2 oz), SpO2 100 %. No results found. No results for input(s): WBC, HGB, HCT, PLT in the last 72 hours. No results for input(s): NA, K, CL, GLUCOSE, BUN, CREATININE, CALCIUM in the last 72 hours.  Invalid input(s): CO CBG (last 3)   Recent Labs  06/09/17 1734 06/09/17 2055 06/10/17 0605  GLUCAP 111* 109* 82    Wt Readings from Last 3 Encounters:  06/04/17 97.9 kg (215 lb 12.2 oz)  05/30/17 98 kg (216 lb)  05/10/17 98.1 kg (216 lb 3.2 oz)    Physical Exam:  BP 138/74 (BP Location: Right Arm)   Pulse 72   Temp 98.8 F (37.1 C) (Oral)   Resp 18   Ht 6\' 3"  (1.905 m)   Wt 97.9 kg (215 lb 12.2 oz)   SpO2 100%   BMI 26.97 kg/m  Constitutional: He appears well-developed and well-nourished.  HENT: Normocephalic and atraumatic.  Eyes: EOM are normal. No discharge.  Cardiovascular: RRR without murmur. No JVD  Respiratory: CTA Bilaterally without wheezes or rales. Normal effort  GI: Soft. Bowel sounds are normal.  Musculoskeletal: He exhibits edema and tenderness.  Left BKA with limb guard in place. Full extension of limb Right transmet amputation  Neurological: He is alert and oriented to person, place, and time.  Motor: 5/5 throughout, except Left HF (4+/5 - pain inhibition, stable)  Skin: Skin is warm and dry.  BKA generally intact. Some central drainage Psychiatric: He has a normal mood and affect. His behavior is normal. Thought content normal.    Assessment/Plan: 1. Functional deficits secondary to left BKA  05/30/2017 as well as history of right foot transmetatarsal amputation  which require 3+ hours per day of interdisciplinary therapy in a comprehensive inpatient rehab setting. Physiatrist is providing close team supervision and 24 hour management of active medical problems listed below. Physiatrist and rehab team continue to assess barriers to discharge/monitor patient progress toward functional and medical goals.  Function:  Bathing Bathing position   Position: Wheelchair/chair at sink  Bathing parts Body parts bathed by patient: Right arm, Left arm, Chest, Abdomen, Right upper leg, Left upper leg, Right lower leg, Front perineal area, Buttocks Body parts bathed by helper: Back  Bathing assist Assist Level: Touching or steadying assistance(Pt > 75%)      Upper Body Dressing/Undressing Upper body dressing   What is the patient wearing?: Pull over shirt/dress     Pull over shirt/dress - Perfomed by patient: Thread/unthread right sleeve, Thread/unthread left sleeve, Put head through opening, Pull shirt over trunk          Upper body assist Assist Level: Supervision or verbal cues      Lower Body Dressing/Undressing Lower body dressing   What is the patient wearing?: Underwear, Pants, Socks, Shoes, AFO Underwear - Performed by patient: Thread/unthread right underwear leg, Thread/unthread left underwear leg, Pull underwear up/down   Pants- Performed by patient: Thread/unthread right pants leg, Thread/unthread left pants leg, Pull pants up/down       Socks - Performed by patient:  Don/doff right sock   Shoes - Performed by patient: Don/doff right shoe     AFO - Performed by helper: Don/doff right AFO      Lower body assist        Toileting Toileting Toileting activity did not occur:  (No BM, used urinal) Toileting steps completed by patient: Adjust clothing prior to toileting, Performs perineal hygiene, Adjust clothing after toileting   Toileting Assistive Devices: Grab  bar or rail  Toileting assist Assist level: Supervision or verbal cues   Transfers Chair/bed transfer   Chair/bed transfer method: Squat pivot Chair/bed transfer assist level: Supervision or verbal cues Chair/bed transfer assistive device: Armrests     Locomotion Ambulation     Max distance:  (100 ft) Assist level: Supervision or verbal cues   Wheelchair   Type: Manual Max wheelchair distance: 150 ft Assist Level: Supervision or verbal cues  Cognition Comprehension Comprehension assist level: Follows basic conversation/direction with no assist  Expression Expression assist level: Expresses basic needs/ideas: With no assist  Social Interaction Social Interaction assist level: Interacts appropriately with others - No medications needed.  Problem Solving Problem solving assist level: Solves complex 90% of the time/cues < 10% of the time  Memory Memory assist level: Complete Independence: No helper    Medical Problem List and Plan: 1.  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 Can remove PRN 2.  DVT Prophylaxis/Anticoagulation: Subcutaneous Lovenox. Monitor platelet counts and any signs of bleeding 3. Pain Management: Neurontin 300 mg daily at bedtime, oxycodone as needed 4. Mood: Provide emotional support 5. Neuropsych: This patient is capable of making decisions on his own behalf. 6. Skin/Wound Care: Routine skin checks 7. Fluids/Electrolytes/Nutrition: Routine I&O's  8. Acute blood loss anemia.   Hb 7.4 on 7/3  Labs ordered for Monday  Cont to monitor 9. CKD stage IV. Baseline creatinine 3.85-4.28.   Cr. 3.93 on 7/3  Cont to monitor  Labs ordered for Monday 10. Hypertension. Norvasc 10 mg daily, Coreg 25 mg twice a day  Monitor with increased mobility  Overall controlled 7/6 11. Diabetes mellitus with peripheral  neuropathy. Hemoglobin A1c 6.9. Lantus insulin 12 units twice a day. Check blood sugars before meals and at bedtime. Diabetic teaching  Monitor with increased mobility  Overall controlled 7/6 12. Hyperlipidemia. Lipitor 13. Constipation. Laxative assistance  LOS (Days) 6 A FACE TO FACE EVALUATION WAS PERFORMED  SWARTZ,ZACHARY T 06/10/2017 7:50 AM

## 2017-06-11 ENCOUNTER — Inpatient Hospital Stay (HOSPITAL_COMMUNITY): Payer: BLUE CROSS/BLUE SHIELD | Admitting: Physical Therapy

## 2017-06-11 ENCOUNTER — Inpatient Hospital Stay (HOSPITAL_COMMUNITY): Payer: Medicare Other

## 2017-06-11 ENCOUNTER — Inpatient Hospital Stay (HOSPITAL_COMMUNITY): Payer: Medicare Other | Admitting: Occupational Therapy

## 2017-06-11 ENCOUNTER — Inpatient Hospital Stay (HOSPITAL_COMMUNITY): Payer: BLUE CROSS/BLUE SHIELD

## 2017-06-11 HISTORY — PX: IR REMOVAL TUN CV CATH W/O FL: IMG2289

## 2017-06-11 LAB — GLUCOSE, CAPILLARY
GLUCOSE-CAPILLARY: 120 mg/dL — AB (ref 65–99)
GLUCOSE-CAPILLARY: 100 mg/dL — AB (ref 65–99)
Glucose-Capillary: 110 mg/dL — ABNORMAL HIGH (ref 65–99)
Glucose-Capillary: 126 mg/dL — ABNORMAL HIGH (ref 65–99)

## 2017-06-11 LAB — CBC
HCT: 25.9 % — ABNORMAL LOW (ref 39.0–52.0)
Hemoglobin: 7.8 g/dL — ABNORMAL LOW (ref 13.0–17.0)
MCH: 21.6 pg — ABNORMAL LOW (ref 26.0–34.0)
MCHC: 30.1 g/dL (ref 30.0–36.0)
MCV: 71.7 fL — ABNORMAL LOW (ref 78.0–100.0)
PLATELETS: 308 10*3/uL (ref 150–400)
RBC: 3.61 MIL/uL — AB (ref 4.22–5.81)
RDW: 15.8 % — AB (ref 11.5–15.5)
WBC: 10.1 10*3/uL (ref 4.0–10.5)

## 2017-06-11 LAB — CREATININE, SERUM
Creatinine, Ser: 4.32 mg/dL — ABNORMAL HIGH (ref 0.61–1.24)
GFR calc non Af Amer: 14 mL/min — ABNORMAL LOW (ref >60)
GFR, EST AFRICAN AMERICAN: 16 mL/min — AB (ref >60)

## 2017-06-11 LAB — BASIC METABOLIC PANEL
ANION GAP: 8 (ref 5–15)
BUN: 67 mg/dL — ABNORMAL HIGH (ref 6–20)
CALCIUM: 8.4 mg/dL — AB (ref 8.9–10.3)
CO2: 21 mmol/L — ABNORMAL LOW (ref 22–32)
Chloride: 109 mmol/L (ref 101–111)
Creatinine, Ser: 4.54 mg/dL — ABNORMAL HIGH (ref 0.61–1.24)
GFR calc Af Amer: 15 mL/min — ABNORMAL LOW (ref >60)
GFR, EST NON AFRICAN AMERICAN: 13 mL/min — AB (ref >60)
Glucose, Bld: 129 mg/dL — ABNORMAL HIGH (ref 65–99)
POTASSIUM: 5 mmol/L (ref 3.5–5.1)
SODIUM: 138 mmol/L (ref 135–145)

## 2017-06-11 MED ORDER — CHLORHEXIDINE GLUCONATE 4 % EX LIQD
CUTANEOUS | Status: AC
  Filled 2017-06-11: qty 15

## 2017-06-11 MED ORDER — LIDOCAINE HCL (PF) 1 % IJ SOLN
INTRAMUSCULAR | Status: AC
  Filled 2017-06-11: qty 30

## 2017-06-11 NOTE — Progress Notes (Signed)
Occupational Therapy Discharge Summary  Patient Details  Name: [name] MRN: [id number] Date of Birth: [birth year]  Today's Date: 06/11/2017 OT Individual Time: 1002-1100 OT Individual Time Calculation (min): 58 min   Session Note:  Pt began session by gathering grooming items and materials using the RW, including carrying towels and washcloths as well.  He washed his face and UB at the sink in sitting and then re-donned the pullover shirt he had on earlier.  Pt reported already taking a bath before his therapy sessions began.  Did not attempt shower secondary to still having PICC line.  Next had pt roll his wheelchair down to the therapy gym.  He completed 4 intervals on the UE ergonometer with resistance set on level 12.  He was able to complete sets of 3 mins, 2 mins, 3 mins and 3 mins with RPMs maintained at 26-28 per minute.  Small 1-2 minute rest breaks taken in between sets.  Next had pt roll himself to the ADL apartment and complete tub/shower transfer transfer with use of the RW and tub bench.  Discussed use of hand held shower and drying off in the shower before transferring out.  Pt had been covering the RLE before with a plastic bag, so he is familiar with this technique.  Finished session with wheelchair mobility to the room and transfer squat pivot to the bed without use of an assistive device.  Call button and phone in reach.     Patient has met 11 of 11 long term goals due to improved activity tolerance, improved balance and ability to compensate for deficits.  Patient to discharge at overall Modified Independent level.  Patient's care partner is independent to provide the necessary physical and cognitive assistance at discharge.    Reasons goals not met: NA  Recommendation:  Patient will benefit from ongoing skilled OT services in home health setting to continue to advance functional skills in the area of BADL and Reduce care partner burden.  Feel pt is overall modified  independent but will benefit from [facility] eval for safety in familiar environment.    Equipment: tub bench and 3:1  Reasons for discharge: treatment goals met and discharge from hospital  Patient/family agrees with progress made and goals achieved: Yes  OT Discharge Precautions/Restrictions  Precautions Precautions: Fall Precaution Comments: L residual limb with plastic KI guard  Required Braces or Orthoses: Other Brace/Splint Other Brace/Splint: L plastic KI guard, R AFO  Restrictions Weight Bearing Restrictions: Yes [name] Weight Bearing: Non weight bearing  Pain Pain Assessment Pain Assessment: Faces Pain Score: 8  Faces Pain Scale: Hurts a little bit Pain Type: Phantom pain Pain Location: Leg Pain Orientation: Left Pain Descriptors / Indicators: Discomfort;Penetrating Pain Onset: On-going Pain Intervention(s): Medication (See eMAR) ADL  See Function section of chart for details  Vision Baseline Vision/History: Wears glasses Wears Glasses: Reading only Patient Visual Report: No change from baseline Vision Assessment?: No apparent visual deficits Perception  Perception: Within Functional Limits Praxis Praxis: Intact Cognition Overall Cognitive Status: Within Functional Limits for tasks assessed Arousal/Alertness: Awake/alert Orientation Level: Oriented X4 Attention: Sustained Sustained Attention: Appears intact Memory: Appears intact Awareness: Appears intact Problem Solving: Appears intact Safety/Judgment: Appears intact Sensation Sensation Light Touch: Appears Intact Stereognosis: Appears Intact Hot/Cold: Appears Intact Proprioception: Appears Intact Additional Comments: Sensation intact in BUEs Coordination Gross Motor Movements are Fluid and Coordinated: Yes Fine Motor Movements are Fluid and Coordinated: Yes Motor  Motor Motor: Within Functional Limits Mobility  Transfers Transfers: Sit to  Stand Sit to Stand: 6: Modified independent  (Device/Increase time);With armrests;From chair/3-in-1;With upper extremity assist Stand to Sit: 6: Modified independent (Device/Increase time);With upper extremity assist;To chair/3-in-1;With armrests  Trunk/Postural Assessment  Cervical Assessment Cervical Assessment: Within Functional Limits Thoracic Assessment Thoracic Assessment: Within Functional Limits Lumbar Assessment Lumbar Assessment: Within Functional Limits Postural Control Postural Control: Within Functional Limits  Balance Balance Balance Assessed: Yes Dynamic Sitting Balance Dynamic Sitting - Level of Assistance: 7: Independent Static Standing Balance Static Standing - Balance Support: Left upper extremity supported;Right upper extremity supported;During functional activity Static Standing - Level of Assistance: 6: Modified independent (Device/Increase time) Dynamic Standing Balance Dynamic Standing - Balance Support: During functional activity;Bilateral upper extremity supported Dynamic Standing - Level of Assistance: 6: Modified independent (Device/Increase time) Extremity/Trunk Assessment RUE Assessment RUE Assessment: Within Functional Limits LUE Assessment LUE Assessment: Within Functional Limits   See Function Navigator for Current Functional Status.  Lavanna Rog OTR/L 06/11/2017, 12:48 PM

## 2017-06-11 NOTE — Progress Notes (Signed)
[name]     PROGRESS NOTE  Subjective/Complaints:  Pt seen laying in bed this AM.  He slept well overnight. He has questions about discharge.   ROS: Denies CP, SOB, N/V/D.   Objective: Vital Signs: Blood pressure (!) 142/73, pulse 66, temperature 97.9 F (36.6 C), temperature source Oral, resp. rate 18, height 6\' 3"  (1.905 m), weight 97.9 kg (215 lb 12.2 oz), SpO2 100 %. No results found. No results for input(s): WBC, HGB, HCT, PLT in the last 72 hours.  Recent Labs  06/11/17 0541  CREATININE 4.32*   CBG (last 3)   Recent Labs  06/10/17 1622 06/10/17 2114 06/11/17 0606  GLUCAP 107* 144* 120*    Wt Readings from Last 3 Encounters:  06/04/17 97.9 kg (215 lb 12.2 oz)  05/30/17 98 kg (216 lb)  05/10/17 98.1 kg (216 lb 3.2 oz)    Physical Exam:  BP (!) 142/73 (BP Location: Right Arm)   Pulse 66   Temp 97.9 F (36.6 C) (Oral)   Resp 18   Ht 6\' 3"  (1.905 m)   Wt 97.9 kg (215 lb 12.2 oz)   SpO2 100%   BMI 26.97 kg/m  Constitutional: He appears well-developed and well-nourished.  HENT: Normocephalic and atraumatic.  Eyes: EOM are normal. No discharge.  Cardiovascular: RRR. No JVD  Respiratory: CTA. Normal effort  GI: Soft. Bowel sounds are normal.  Musculoskeletal: He exhibits edema and tenderness.  Left BKA  Right transmet amputation  Neurological: He is alert and oriented to person, place, and time.  Motor: 5/5 throughout.  Skin: Skin is warm and dry.  BKA with sutures c/d/i Psychiatric: He has a normal mood and affect. His behavior is normal. Thought content normal.    Assessment/Plan: 1. 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 Physiatrist is providing close team supervision and 24 hour management of active medical problems listed  below. Physiatrist and rehab team continue to assess barriers to discharge/monitor patient progress toward functional and medical goals.  Function:  Bathing Bathing position   Position: Wheelchair/chair at sink  Bathing parts Body parts bathed by patient: Right arm, Left arm, Chest, Abdomen, Right upper leg, Left upper leg, Right lower leg, Front perineal area, Buttocks Body parts bathed by helper: Back  Bathing assist Assist Level: Touching or steadying assistance(Pt > 75%)      Upper Body Dressing/Undressing Upper body dressing   What is the patient wearing?: Pull over shirt/dress     Pull over shirt/dress - Perfomed by patient: Thread/unthread right sleeve, Thread/unthread left sleeve, Put head through opening, Pull shirt over trunk          Upper body assist Assist Level: Supervision or verbal cues      Lower Body Dressing/Undressing Lower body dressing   What is the patient wearing?: Underwear, Pants, Socks, Shoes, AFO Underwear - Performed by patient: Thread/unthread right underwear leg, Thread/unthread left underwear leg, Pull underwear up/down   Pants- Performed by patient: Thread/unthread right pants leg, Thread/unthread left pants leg, Pull pants up/down       Socks - Performed by patient: Don/doff right sock   Shoes - Performed by patient: Don/doff right shoe     AFO - Performed by helper: Don/doff right AFO      Lower body assist        Toileting Toileting  Toileting activity did not occur:  (No BM, used urinal) Toileting steps completed by patient: Performs perineal hygiene, Adjust clothing prior to toileting, Adjust clothing after toileting   Toileting Assistive Devices: Grab bar or rail  Toileting assist Assist level: Supervision or verbal cues   Transfers Chair/bed transfer   Chair/bed transfer method: Squat pivot Chair/bed transfer assist level: No Help, no cues, assistive device, takes more than a reasonable amount of time Chair/bed transfer  assistive device: Armrests     Locomotion Ambulation     Max distance: 60 Assist level: No help, No cues, assistive device, takes more than a reasonable amount of time   Wheelchair   Type: Manual Max wheelchair distance: 150 ft Assist Level: No help, No cues, assistive device, takes more than reasonable amount of time  Cognition Comprehension Comprehension assist level: Follows basic conversation/direction with no assist  Expression Expression assist level: Expresses basic needs/ideas: With no assist  Social Interaction Social Interaction assist level: Interacts appropriately with others - No medications needed.  Problem Solving Problem solving assist level: Solves complex 90% of the time/cues < 10% of the time  Memory Memory assist level: Complete Independence: No helper    Medical Problem List and Plan: 1.  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  DVT Prophylaxis/Anticoagulation: Subcutaneous Lovenox. Monitor platelet counts and any signs of bleeding 3. Pain Management: Neurontin 300 mg daily at bedtime, oxycodone as needed 4. Mood: Provide emotional support 5. Neuropsych: This patient is capable of making decisions on his own behalf. 6. Skin/Wound Care: Routine skin checks 7. Fluids/Electrolytes/Nutrition: Routine I&O's  8. Acute blood loss anemia.   Hb 7.4 on 7/3  Labs ordered, plan to maintain PICC until labs resulted  Cont to monitor 9. CKD stage IV. Baseline creatinine 3.85-4.28.   Cr. 4.32 on 7/9  Cont to monitor  Labs ordered  10. Hypertension. Norvasc 10 mg daily, Coreg 25 mg twice a day  Monitor with increased mobility  Overall controlled 7/9 11. Diabetes mellitus with peripheral neuropathy. Hemoglobin A1c 6.9. Lantus insulin 12 units twice a day. Check blood sugars before meals and at bedtime. Diabetic teaching  Monitor with  increased mobility  Overall controlled 7/9 12. Hyperlipidemia. Lipitor 13. Constipation. Laxative assistance  LOS (Days) 7 A FACE TO FACE EVALUATION WAS PERFORMED  [name] Lorie Phenix 06/11/2017 9:21 AM

## 2017-06-11 NOTE — Discharge Summary (Signed)
[name], [name]              ACCOUNT NO.:  [id number]  MEDICAL RECORD NO.:  [id number]  LOCATION:                                 FACILITY:  PHYSICIAN:  Delice Lesch, MD        DATE OF BIRTH:  [birth year]  DATE OF ADMISSION:  06/04/2017 DATE OF DISCHARGE:  06/12/2017                              DISCHARGE SUMMARY   DISCHARGE DIAGNOSES: 1. 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 2. Subcutaneous Lovenox for deep venous thrombosis prophylaxis. 3. Pain management. 4. Acute blood loss anemia. 5. Chronic kidney disease stage 4. 6. Hypertension. 7. Diabetes mellitus. 8. Peripheral neuropathy. 9. Hyperlipidemia. 10.Constipation.  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  Lives with spouse, independent with assistive device prior to admission.  Presented May 30, 2017, with gangrenous left foot.  No change with conservative care.  Underwent left BKA, May 30, 2017, per Dr. Doran Durand.  Hospital course, acute blood loss anemia, pain management, renal function stable.  Subcutaneous Lovenox for DVT prophylaxis.  Physical and occupational therapy ongoing.  The patient was admitted for a comprehensive rehab program.  PAST MEDICAL HISTORY:  See discharge diagnoses.  SOCIAL HISTORY:  Lives with spouse.  Used an assistive device prior to admission.  FUNCTIONAL STATUS:  Upon admission to [facility] was minimal guard 60 feet rolling walker; minimal guard sit to stand; min-to-mod assist activities of daily living.  PHYSICAL EXAMINATION:  VITAL SIGNS:  Blood pressure 146/72, pulse 76, temperature 98, respirations 18. GENERAL:  This was an alert male, oriented x3.  EOMs intact. NECK:  Supple.   Nontender.  No JVD. CARDIAC:  Regular rate and rhythm.  No murmur. ABDOMEN:  Soft, nontender.  Good bowel sounds. LUNGS:  Clear to auscultation without wheeze. EXTREMITIES:  Left BKA site dressed, clean and dry.  Well-healed right transmetatarsal amputation.  REHABILITATION HOSPITAL COURSE:  The patient was admitted to Inpatient Rehab Services with therapies initiated on a 3-hour daily basis, consisting of physical therapy, occupational therapy, and rehabilitation nursing.  The following issues were addressed during the patient's rehabilitation stay.  Pertaining to [name]'s left BKA, May 30, 2017, surgical site healing nicely.  The patient will follow up with Dr. Doran Durand.  Noted history of right transmetatarsal amputation, January 2015, well healed.  Subcutaneous Lovenox for DVT prophylaxis.  No bleeding episodes.  Pain management, use of Neurontin as well as oxycodone.  Acute blood loss anemia stable, no bleeding episodes. Remained asymptomatic.  No chest pain or shortness of breath.  CKD stage 4, creatinine 3.93 with a baseline 3.85.  Blood pressures controlled on Norvasc as well as Coreg.  Noted history of diabetes mellitus, peripheral neuropathy.  Hemoglobin A1c of 6.9, insulin therapy as directed with diabetic teaching.  Lipitor ongoing for hyperlipidemia. Bouts of constipation resolved with laxative assistance.  The patient received weekly collaborative interdisciplinary team conferences to discuss estimated length of stay, family teaching, any barriers  to discharge.  Sessions focused on functional independence, education, home exercise program, safety transfers and ambulation.  Working with energy conservation techniques.  Performed lower extremity Mat exercises consisting of prone lying and attempting hip extensions.  Propelled wheelchair independently.  Stand pivot transfers with supervision. Ambulating 75 feet rolling walker, supervision.  Performed static standing  balance supervision.  Gathered belongings for activities of daily living and homemaking.  Working with upper body and lower body strengthening.  Full family teaching was completed and plan discharge home.  DISCHARGE MEDICATIONS: 1. Norvasc 10 mg p.o. q.h.s. 2. Lipitor 10 mg p.o. q.h.s. 3. Coreg 25 mg p.o. b.i.d. 4. Colace 100 mg p.o. b.i.d. 5. Neurontin 300 mg p.o. q.h.s. 6. Lantus insulin 12 units at bedtime. 7. Senokot 1 tablet twice daily, hold for loose stool. 8. Sodium bicarbonate 650 mg p.o. b.i.d. 9. Oxycodone 5 to 10 mg every 3 hours as needed for pain. 10.Tylenol as needed. 11. Iron tablet daily   DIET:  Diabetic diet.  FOLLOWUP:  The patient would follow up with Dr. Delice Lesch at the Outpatient Rehab Service office as directed; Dr. [name], Orthopedic Services, call for appointment.     Lauraine Rinne, P.A.   ______________________________ Delice Lesch, MD    DA/MEDQ  D:  06/11/2017  T:  06/11/2017  Job:  [id number]  cc:   [name], MD Delice Lesch, MD

## 2017-06-11 NOTE — Progress Notes (Signed)
Physical Therapy Session Note  Patient Details  Name: [name] MRN: [id number] Date of Birth: 25-Apr-1961  Today's Date: 06/11/2017 PT Individual Time: 1415-1530 PT Individual Time Calculation (min): 75 min   Short Term Goals: Week 1:  PT Short Term Goal 1 (Week 1): =LTGs due to ELOS  Skilled Therapeutic Interventions/Progress Updates:    Session focused on functional transfers, gait training with RW with focus on distance for endurance and strengthening (max distance of 80' due to fatigue), w/c mobility at modified independent level for functional mobility and overall endurance, education in preparation for discharge, and dynamic standing balance re-training with 1 UE support during Wii baseball and Wii bowling in standing. Pt overall modified independent for basic transfers, gait, and standing balance during session demonstrating safe behaviors. Pt able to perform simulated car transfer to sedan height safely as well. Supervision for gait up/down ramp and over mulched surface to simulate community mobility for safety. Denies concerns in regards to d/c tomorrow.   Therapy Documentation Precautions:  Precautions Precautions: Fall Precaution Comments: L residual limb with plastic KI guard  Required Braces or Orthoses: Other Brace/Splint Other Brace/Splint: L plastic KI guard, R AFO  Restrictions Weight Bearing Restrictions: Yes [name] Weight Bearing: Non weight bearing   Pain: No reports of pain.  See Function Navigator for Current Functional Status.   Therapy/Group: Individual Therapy  [name], PT, DPT  06/11/2017, 3:48 PM

## 2017-06-11 NOTE — Plan of Care (Addendum)
Problem: RH Ambulation Goal: LTG Patient will ambulate in controlled environment (PT) LTG: Patient will ambulate in a controlled environment, # of feet with assistance (PT).  Outcome: Not Met (add Reason) Pt will be using w/c primarily

## 2017-06-11 NOTE — Progress Notes (Signed)
Physical Therapy Discharge Summary  Patient Details  Name: PENG THORSTENSON MRN: [id number] Date of Birth: 1961/07/11  Today's Date: 06/11/2017 PT Individual Time: 0800-0900 PT Individual Time Calculation (min): 60 min    Therapy Treatment interventions: Pt supine in bed upone PT arrival, agreeable to therapy tx. Pt transferred from bed to w/c squat pivot, Mod I. Pt propelled w/c 150 ft to the gym using B UEs, Mod I. Pt transferred to the mat and reviewed/performed HEP including supine SLR, sidelying hip abduction, prone hip extension all 2 x 15. PT also added seated HS stretch, prone press up on elbows, supine pirifomis stretch and glute stretch all held for 30 sec x 2. Handout provided for stretches added. PT ascended/descended 12 steps with supervision to work on upper and lower body strengthening. Pt ambulated back to his w/c. Pt left in w/c in room with needs in reach.  Patient has met 6 of 7 long term goals due to improved activity tolerance, improved balance, increased strength, increased range of motion, decreased pain and ability to compensate for deficits.  Patient to discharge at a wheelchair level Modified Independent.     Reasons goals not met: Decreased endurance with ambulation, pt will be primarily using w/c for long distances  Recommendation:  Patient will benefit from ongoing skilled PT services in home health setting to continue to advance safe functional mobility, address ongoing impairments in balance, strength, ROM, and minimize fall risk.  Equipment: No equipment provided, pt already has w/c and RW  Reasons for discharge: treatment goals met  Patient/family agrees with progress made and goals achieved: Yes  PT Discharge Precautions/Restrictions Precautions Precautions: Fall Precaution Comments: L residual limb with plastic KI guard  Required Braces or Orthoses: Other Brace/Splint Other Brace/Splint: L plastic KI guard, R AFO  Restrictions Weight Bearing  Restrictions: Yes [name] Weight Bearing: Non weight bearing Vital Signs Therapy Vitals Temp: 97.9 F (36.6 C) Temp Source: Oral Pulse Rate: 66 Resp: 18 BP: (!) 142/73 Patient Position (if appropriate): Lying Oxygen Therapy SpO2: 100 % O2 Device: Not Delivered Pain Pain Assessment Pain Assessment: 0-10 Pain Score: 7  Pain Type: Surgical pain;Phantom pain Pain Location: Leg Pain Orientation: Left Pain Descriptors / Indicators: Aching Pain Frequency: Intermittent Pain Intervention(s): Medication (See eMAR) Multiple Pain Sites: No Cognition Overall Cognitive Status: Within Functional Limits for tasks assessed Arousal/Alertness: Awake/alert Orientation Level: Oriented X4 Attention: Sustained Sustained Attention: Appears intact Memory: Appears intact Awareness: Appears intact Problem Solving: Appears intact Safety/Judgment: Appears intact Sensation Sensation Light Touch:  (Premorbid neuropathy in R LE, WFL) Stereognosis: Appears Intact Hot/Cold: Appears Intact Proprioception: Appears Intact Coordination Gross Motor Movements are Fluid and Coordinated: Yes Fine Motor Movements are Fluid and Coordinated: Yes Motor  Motor Motor: Within Functional Limits  Trunk/Postural Assessment  Cervical Assessment Cervical Assessment: Within Functional Limits Thoracic Assessment Thoracic Assessment: Within Functional Limits Lumbar Assessment Lumbar Assessment: Within Functional Limits Postural Control Postural Control: Within Functional Limits  Balance Balance Balance Assessed: Yes Dynamic Sitting Balance Dynamic Sitting - Balance Support: Feet supported;During functional activity Dynamic Sitting - Level of Assistance: 7: Independent Static Standing Balance Static Standing - Balance Support: Left upper extremity supported;Right upper extremity supported Static Standing - Level of Assistance: 6: Modified independent (Device/Increase time) Dynamic Standing Balance Dynamic  Standing - Balance Support: During functional activity;Bilateral upper extremity supported Dynamic Standing - Level of Assistance: 6: Modified independent (Device/Increase time) Extremity Assessment  RLE Assessment RLE Assessment: Within Functional Limits [name] Assessment [name] Assessment: Within Functional Limits (decreased knee  flexion/extension due to tight quads and hamstrings. Decreased hip extension due to tight hip flexors. )   See Function Navigator for Current Functional Status.  Netta Corrigan, PT, DPT 06/11/2017, 8:32 AM

## 2017-06-11 NOTE — Discharge Instructions (Signed)
Inpatient Rehab Discharge Instructions  [facility] Discharge date and time: No discharge date for patient encounter.   Activities/Precautions/ Functional Status: Activity: activity as tolerated Diet: diabetic diet Wound Care: keep wound clean and dry Functional status:  ___ No restrictions     ___ Walk up steps independently ___ 24/7 supervision/assistance   ___ Walk up steps with assistance ___ Intermittent supervision/assistance  ___ Bathe/dress independently ___ Walk with walker     _x__ Bathe/dress with assistance ___ Walk Independently    ___ Shower independently ___ Walk with assistance    ___ Shower with assistance ___ No alcohol     ___ Return to work/school ________  Special Instructions:    COMMUNITY REFERRALS UPON DISCHARGE:    Home Health:   PT, OT, RN  [place]   Date of last service:06/12/2017  Medical Equipment/Items Ordered:[facility]   [id number]   GENERAL COMMUNITY RESOURCES FOR PATIENT/FAMILY: Support Groups:[employer] [facility] THE FIRST Tuesday [name] @ 7:00 PM @ [place] [phone]  My questions have been answered and I understand these instructions. I will adhere to these goals and the provided educational materials after my discharge from the hospital.  Patient/Caregiver Signature _______________________________ Date __________  Clinician Signature _______________________________________ Date __________  Please bring this form and your medication list with you to all your follow-up doctor's appointments.

## 2017-06-11 NOTE — Discharge Summary (Signed)
Discharge summary job [id number]

## 2017-06-12 ENCOUNTER — Encounter (HOSPITAL_COMMUNITY): Payer: Self-pay | Admitting: Interventional Radiology

## 2017-06-12 LAB — GLUCOSE, CAPILLARY: GLUCOSE-CAPILLARY: 114 mg/dL — AB (ref 65–99)

## 2017-06-12 MED ORDER — IRON 325 (65 FE) MG PO TABS: 1.0000 | ORAL_TABLET | Freq: Every day | ORAL | 0 refills | Status: DC

## 2017-06-12 MED ORDER — ATORVASTATIN CALCIUM 10 MG PO TABS: 10.0000 mg | ORAL_TABLET | Freq: Every day | ORAL | 0 refills | Status: DC

## 2017-06-12 MED ORDER — OXYCODONE HCL 5 MG PO TABS: 5.0000 mg | ORAL_TABLET | ORAL | 0 refills | Status: DC | PRN

## 2017-06-12 MED ORDER — INSULIN GLARGINE 100 UNIT/ML SOLOSTAR PEN: 12.0000 [IU] | PEN_INJECTOR | Freq: Two times a day (BID) | SUBCUTANEOUS | 11 refills | Status: DC

## 2017-06-12 MED ORDER — GABAPENTIN 300 MG PO CAPS: 300.0000 mg | ORAL_CAPSULE | Freq: Every day | ORAL | 0 refills | Status: DC

## 2017-06-12 MED ORDER — SODIUM BICARBONATE 650 MG PO TABS: 650.0000 mg | ORAL_TABLET | Freq: Two times a day (BID) | ORAL | 0 refills | Status: DC

## 2017-06-12 MED ORDER — AMLODIPINE BESYLATE 10 MG PO TABS: 10.0000 mg | ORAL_TABLET | Freq: Every day | ORAL | 0 refills | Status: DC

## 2017-06-12 MED ORDER — CARVEDILOL 25 MG PO TABS: 25.0000 mg | ORAL_TABLET | Freq: Two times a day (BID) | ORAL | 0 refills | Status: DC

## 2017-06-12 NOTE — Progress Notes (Signed)
Pt dishcarged to home with family. Discharge instructions given to pt by Marlowe Shores, PA with verbal understanding. RN educated pt on stump wrapping/dressing change with no further questions from pt.

## 2017-06-12 NOTE — Progress Notes (Signed)
[place] PHYSICAL MEDICINE & REHABILITATION     PROGRESS NOTE  Subjective/Complaints:  Pt seen laying in bed this AM.  He slept well overnight.  He has questions for follow up appointment and is ready for discharge.   ROS: Denies CP, SOB, N/V/D.   Objective: Vital Signs: Blood pressure (!) 156/71, pulse 70, temperature 98.4 F (36.9 C), temperature source Oral, resp. rate 20, height 6\' 3"  (1.905 m), weight 97.9 kg (215 lb 12.2 oz), SpO2 99 %. No results found.  Recent Labs  06/11/17 1319  WBC 10.1  HGB 7.8*  HCT 25.9*  PLT 308    Recent Labs  06/11/17 0541 06/11/17 1319  NA  --  138  K  --  5.0  CL  --  109  GLUCOSE  --  129*  BUN  --  67*  CREATININE 4.32* 4.54*  CALCIUM  --  8.4*   CBG (last 3)   Recent Labs  06/11/17 1658 06/11/17 2113 06/12/17 0630  GLUCAP 110* 126* 114*    Wt Readings from Last 3 Encounters:  06/04/17 97.9 kg (215 lb 12.2 oz)  05/30/17 98 kg (216 lb)  05/10/17 98.1 kg (216 lb 3.2 oz)    Physical Exam:  BP (!) 156/71 (BP Location: Right Arm)   Pulse 70   Temp 98.4 F (36.9 C) (Oral)   Resp 20   Ht 6\' 3"  (1.905 m)   Wt 97.9 kg (215 lb 12.2 oz)   SpO2 99%   BMI 26.97 kg/m  Constitutional: He appears well-developed and well-nourished.  HENT: Normocephalic and atraumatic.  Eyes: EOM are normal. No discharge.  Cardiovascular: RRR. No JVD  Respiratory: CTA. Normal effort  GI: Soft. Bowel sounds are normal.  Musculoskeletal: He exhibits edema and tenderness.  Left BKA  Right transmet amputation  Neurological: He is alert and oriented to person, place, and time.  Motor: 5/5 throughout.  Skin: Skin is warm and dry.  BKA with sutures c/d/i Psychiatric: He has a normal mood and affect. His behavior is normal. Thought content normal.    Assessment/Plan: 1. Functional deficits secondary to left BKA 05/30/2017 as well as history of right foot transmetatarsal amputation  which require 3+ hours per day of interdisciplinary therapy  in a comprehensive inpatient rehab setting. Physiatrist is providing close team supervision and 24 hour management of active medical problems listed below. Physiatrist and rehab team continue to assess barriers to discharge/monitor patient progress toward functional and medical goals.  Function:  Bathing Bathing position   Position: Wheelchair/chair at sink  Bathing parts Body parts bathed by patient: Right arm, Left arm, Chest, Abdomen, Front perineal area, Buttocks, Right upper leg, Left upper leg, Right lower leg, Back (per pt report) Body parts bathed by helper: Back  Bathing assist Assist Level: More than reasonable time      Upper Body Dressing/Undressing Upper body dressing   What is the patient wearing?: Pull over shirt/dress     Pull over shirt/dress - Perfomed by patient: Thread/unthread right sleeve, Thread/unthread left sleeve, Put head through opening, Pull shirt over trunk          Upper body assist Assist Level: More than reasonable time      Lower Body Dressing/Undressing Lower body dressing   What is the patient wearing?: Underwear, Pants, Socks, Shoes, AFO Underwear - Performed by patient: Thread/unthread right underwear leg, Thread/unthread left underwear leg, Pull underwear up/down   Pants- Performed by patient: Thread/unthread right pants leg, Thread/unthread left pants leg,  Pull pants up/down       Socks - Performed by patient: Don/doff right sock (left sock NA)   Shoes - Performed by patient: Don/doff right shoe, Fasten right (left shoe NA)   AFO - Performed by patient: Don/doff right AFO AFO - Performed by helper: Don/doff right AFO      Lower body assist Assist for lower body dressing: More than reasonable time      Toileting Toileting Toileting activity did not occur:  (No BM, used urinal) Toileting steps completed by patient: Performs perineal hygiene, Adjust clothing prior to toileting, Adjust clothing after toileting   Toileting  Assistive Devices: Grab bar or rail  Toileting assist Assist level: More than reasonable time   Transfers Chair/bed transfer   Chair/bed transfer method: Squat pivot Chair/bed transfer assist level: No Help, no cues, assistive device, takes more than a reasonable amount of time Chair/bed transfer assistive device: Armrests     Locomotion Ambulation     Max distance: 11' Assist level: No help, No cues, assistive device, takes more than a reasonable amount of time   Wheelchair   Type: Manual Max wheelchair distance: 150 ft Assist Level: No help, No cues, assistive device, takes more than reasonable amount of time  Cognition Comprehension Comprehension assist level: Follows complex conversation/direction with extra time/assistive device  Expression Expression assist level: Expresses complex ideas: With extra time/assistive device  Social Interaction Social Interaction assist level: Interacts appropriately with others - No medications needed.  Problem Solving Problem solving assist level: Solves complex problems: With extra time  Memory Memory assist level: Complete Independence: No helper    Medical Problem List and Plan: 1.  Decreased functional mobility secondary to left BKA 05/30/2017 as well as history of right foot transmetatarsal amputation January 2015  D/c today  Will see patient in 1-2 weeks for transitional care management 2.  DVT Prophylaxis/Anticoagulation: Subcutaneous Lovenox. Monitor platelet counts and any signs of bleeding 3. Pain Management: Neurontin 300 mg daily at bedtime, oxycodone as needed 4. Mood: Provide emotional support 5. Neuropsych: This patient is capable of making decisions on his own behalf. 6. Skin/Wound Care: Routine skin checks 7. Fluids/Electrolytes/Nutrition: Routine I&O's  8. Acute blood loss anemia.   Hb 7.8 on 7/9  Cont to monitor 9. CKD stage IV. Baseline creatinine 3.85-4.28.   Cr. 4.54 on 7/10  Encourage fluids  Cont to monitor 10.  Hypertension. Norvasc 10 mg daily, Coreg 25 mg twice a day  Monitor with increased mobility  Elevated this AM.  Will require ambulatory adjustments 11. Diabetes mellitus with peripheral neuropathy. Hemoglobin A1c 6.9. Lantus insulin 12 units twice a day. Check blood sugars before meals and at bedtime. Diabetic teaching  Monitor with increased mobility  Overall controlled 7/10 12. Hyperlipidemia. Lipitor 13. Constipation. Laxative assistance  LOS (Days) 8 A FACE TO FACE EVALUATION WAS PERFORMED  Ankit Lorie Phenix 06/12/2017 8:48 AM

## 2017-06-12 NOTE — Progress Notes (Signed)
Social Work  Discharge Note  The overall goal for the admission was met for:   Discharge location: Yes-HOME WITH WIFE WHO CAN PROVIDE SUPERVISION LEVEL  Length of Stay: Yes-8 DAYS  Discharge activity level: Yes-MOD/I LEVEL  Home/community participation: Yes  Services provided included: MD, RD, PT, OT, RN, CM, Pharmacy and SW  Financial Services: Private Insurance: [employer]  Follow-up services arranged: Home Health: [facility]-PT,OT,RN, DME: [facility] [place] and Patient/Family request agency HH: ACTIVE PT WITH [facility], DME: PREF [facility]  Comments (or additional information):PT DID WELL AND REACHED MOD/I LEVEL, FEELS READY TO [name]. [name]. WIFE CAN BE THERE AND PROVIDE SUPERVISION LEVEL.  Patient/Family verbalized understanding of follow-up arrangements: Yes  Individual responsible for coordination of the follow-up plan: SELF & [name]  Confirmed correct DME delivered: [name] 06/12/2017    [name]

## 2017-06-13 ENCOUNTER — Telehealth: Payer: Self-pay | Admitting: *Deleted

## 2017-06-13 NOTE — Telephone Encounter (Addendum)
Transitional Care call-06/13/17 1st attempt to reach 8:55 am, left message to call office            2nd attempt (11:59) to reach unsuccessful. I left message to call back and the appt date, time, arrival time and address of the office.      Made contact with [name] 2:20 pm    1. Are you/is patient experiencing any problems since coming home? Are there any questions regarding any aspect of care? NO 2. Are there any questions regarding medications administration/dosing? Are meds being taken as prescribed? Patient should review meds with caller to confirm YES HE HAS THEM 3. Have there been any falls? NO 4. Has Home Health been to the house and/or have they contacted you? If not, have you tried to contact them? Can we help you contact them?THEY HAVE NOT. HE HAS THEIR NUMBER AND IS GOING TO CALL THEM. 5. Are bowels and bladder emptying properly? Are there any unexpected incontinence issues? If applicable, is patient following bowel/bladder programs?NO 6. Any fevers, problems with breathing, unexpected pain?NO 7. Are there any skin problems or new areas of breakdown?NO 8. Has the patient/family member arranged specialty MD follow up (ie cardiology/neurology/renal/surgical/etc)?  Can we help arrange?APPT GIVEN WITH DR PATEL AND HE HAS APPT SCHEDULED WITH DR FIELDS. HE IS GOING TO CALL THE KIDNEY MD 9. Does the patient need any other services or support that we can help arrange?NO 10. Are caregivers following through as expected in assisting the patient?YES 11. Has the patient quit smoking, drinking alcohol, or using drugs as recommended?N/A  Appointment 06/15/17 @10 :00 arrive by 9:30 for appt with Dr Posey Pronto. [place]

## 2017-06-15 ENCOUNTER — Encounter
Payer: BLUE CROSS/BLUE SHIELD | Attending: Physical Medicine & Rehabilitation | Admitting: Physical Medicine & Rehabilitation

## 2017-06-22 ENCOUNTER — Encounter: Payer: Self-pay | Admitting: Vascular Surgery

## 2017-06-28 ENCOUNTER — Ambulatory Visit (HOSPITAL_COMMUNITY): Admission: RE | Admit: 2017-06-28 | Payer: BLUE CROSS/BLUE SHIELD | Source: Ambulatory Visit

## 2017-06-28 ENCOUNTER — Ambulatory Visit: Payer: BLUE CROSS/BLUE SHIELD | Admitting: Vascular Surgery

## 2017-07-05 ENCOUNTER — Other Ambulatory Visit (HOSPITAL_COMMUNITY): Payer: Self-pay

## 2017-07-06 ENCOUNTER — Ambulatory Visit (HOSPITAL_COMMUNITY)
Admission: RE | Admit: 2017-07-06 | Discharge: 2017-07-06 | Disposition: A | Discharge status: Home / Self Care | Payer: BLUE CROSS/BLUE SHIELD | Source: Ambulatory Visit | Attending: Nephrology | Admitting: Nephrology

## 2017-07-06 DIAGNOSIS — N184 Chronic kidney disease, stage 4 (severe): Secondary | ICD-10-CM

## 2017-07-06 DIAGNOSIS — D631 Anemia in chronic kidney disease: Secondary | ICD-10-CM | POA: Diagnosis not present

## 2017-07-06 LAB — POCT HEMOGLOBIN-HEMACUE: HEMOGLOBIN: 9.5 g/dL — AB (ref 13.0–17.0)

## 2017-07-06 MED ORDER — EPOETIN ALFA 20000 UNIT/ML IJ SOLN
INTRAMUSCULAR | Status: AC
  Filled 2017-07-06: qty 1

## 2017-07-06 MED ORDER — SODIUM CHLORIDE 0.9 % IV SOLN
510.0000 mg | INTRAVENOUS | Status: DC
  Administered 2017-07-06: 510 mg via INTRAVENOUS
  Filled 2017-07-06: qty 17

## 2017-07-06 MED ORDER — EPOETIN ALFA 20000 UNIT/ML IJ SOLN
20000.0000 [IU] | INTRAMUSCULAR | Status: DC
  Administered 2017-07-06: 20000 [IU] via SUBCUTANEOUS

## 2017-07-06 NOTE — Discharge Instructions (Signed)
Epoetin Alfa injection °What is this medicine? °EPOETIN ALFA (e POE e tin AL fa) helps your body make more red blood cells. This medicine is used to treat anemia caused by chronic kidney failure, cancer chemotherapy, or HIV-therapy. It may also be used before surgery if you have anemia. °This medicine may be used for other purposes; ask your health care provider or pharmacist if you have questions. °COMMON BRAND NAME(S): Epogen, Procrit °What should I tell my health care provider before I take this medicine? °They need to know if you have any of these conditions: °-blood clotting disorders °-cancer patient not on chemotherapy °-cystic fibrosis °-heart disease, such as angina or heart failure °-hemoglobin level of 12 g/dL or greater °-high blood pressure °-low levels of folate, iron, or vitamin B12 °-seizures °-an unusual or allergic reaction to erythropoietin, albumin, benzyl alcohol, hamster proteins, other medicines, foods, dyes, or preservatives °-pregnant or trying to get pregnant °-breast-feeding °How should I use this medicine? °This medicine is for injection into a vein or under the skin. It is usually given by a health care professional in a hospital or clinic setting. °If you get this medicine at home, you will be taught how to prepare and give this medicine. Use exactly as directed. Take your medicine at regular intervals. Do not take your medicine more often than directed. °It is important that you put your used needles and syringes in a special sharps container. Do not put them in a trash can. If you do not have a sharps container, call your pharmacist or healthcare provider to get one. °A special MedGuide will be given to you by the pharmacist with each prescription and refill. Be sure to read this information carefully each time. °Talk to your pediatrician regarding the use of this medicine in children. While this drug may be prescribed for selected conditions, precautions do apply. °Overdosage: If you  think you have taken too much of this medicine contact a poison control center or emergency room at once. °NOTE: This medicine is only for you. Do not share this medicine with others. °What if I miss a dose? °If you miss a dose, take it as soon as you can. If it is almost time for your next dose, take only that dose. Do not take double or extra doses. °What may interact with this medicine? °Do not take this medicine with any of the following medications: °-darbepoetin alfa °This list may not describe all possible interactions. Give your health care provider a list of all the medicines, herbs, non-prescription drugs, or dietary supplements you use. Also tell them if you smoke, drink alcohol, or use illegal drugs. Some items may interact with your medicine. °What should I watch for while using this medicine? °Your condition will be monitored carefully while you are receiving this medicine. °You may need blood work done while you are taking this medicine. °What side effects may I notice from receiving this medicine? °Side effects that you should report to your doctor or health care professional as soon as possible: °-allergic reactions like skin rash, itching or hives, swelling of the face, lips, or tongue °-breathing problems °-changes in vision °-chest pain °-confusion, trouble speaking or understanding °-feeling faint or lightheaded, falls °-high blood pressure °-muscle aches or pains °-pain, swelling, warmth in the leg °-rapid weight gain °-severe headaches °-sudden numbness or weakness of the face, arm or leg °-trouble walking, dizziness, loss of balance or coordination °-seizures (convulsions) °-swelling of the ankles, feet, hands °-unusually weak or tired °  Side effects that usually do not require medical attention (report to your doctor or health care professional if they continue or are bothersome): °-diarrhea °-fever, chills (flu-like symptoms) °-headaches °-nausea, vomiting °-redness, stinging, or swelling at  site where injected °This list may not describe all possible side effects. Call your doctor for medical advice about side effects. You may report side effects to FDA at 1-800-FDA-1088. °Where should I keep my medicine? °Keep out of the reach of children. °Store in a refrigerator between 2 and 8 degrees C (36 and 46 degrees F). Do not freeze or shake. Throw away any unused portion if using a single-dose vial. Multi-dose vials can be kept in the refrigerator for up to 21 days after the initial dose. Throw away unused medicine. °NOTE: This sheet is a summary. It may not cover all possible information. If you have questions about this medicine, talk to your doctor, pharmacist, or health care provider. °© 2018 Elsevier/Gold Standard (2016-07-10 19:42:31) ° °Ferumoxytol injection °What is this medicine? °FERUMOXYTOL is an iron complex. Iron is used to make healthy red blood cells, which carry oxygen and nutrients throughout the body. This medicine is used to treat iron deficiency anemia in people with chronic kidney disease. °This medicine may be used for other purposes; ask your health care provider or pharmacist if you have questions. °COMMON BRAND NAME(S): Feraheme °What should I tell my health care provider before I take this medicine? °They need to know if you have any of these conditions: °-anemia not caused by low iron levels °-high levels of iron in the blood °-magnetic resonance imaging (MRI) test scheduled °-an unusual or allergic reaction to iron, other medicines, foods, dyes, or preservatives °-pregnant or trying to get pregnant °-breast-feeding °How should I use this medicine? °This medicine is for injection into a vein. It is given by a health care professional in a hospital or clinic setting. °Talk to your pediatrician regarding the use of this medicine in children. Special care may be needed. °Overdosage: If you think you have taken too much of this medicine contact a poison control center or emergency  room at once. °NOTE: This medicine is only for you. Do not share this medicine with others. °What if I miss a dose? °It is important not to miss your dose. Call your doctor or health care professional if you are unable to keep an appointment. °What may interact with this medicine? °This medicine may interact with the following medications: °-other iron products °This list may not describe all possible interactions. Give your health care provider a list of all the medicines, herbs, non-prescription drugs, or dietary supplements you use. Also tell them if you smoke, drink alcohol, or use illegal drugs. Some items may interact with your medicine. °What should I watch for while using this medicine? °Visit your doctor or healthcare professional regularly. Tell your doctor or healthcare professional if your symptoms do not start to get better or if they get worse. You may need blood work done while you are taking this medicine. °You may need to follow a special diet. Talk to your doctor. Foods that contain iron include: whole grains/cereals, dried fruits, beans, or peas, leafy green vegetables, and organ meats (liver, kidney). °What side effects may I notice from receiving this medicine? °Side effects that you should report to your doctor or health care professional as soon as possible: °-allergic reactions like skin rash, itching or hives, swelling of the face, lips, or tongue °-breathing problems °-changes in blood pressure °-feeling   faint or lightheaded, falls °-fever or chills °-flushing, sweating, or hot feelings °-swelling of the ankles or feet °Side effects that usually do not require medical attention (report to your doctor or health care professional if they continue or are bothersome): °-diarrhea °-headache °-nausea, vomiting °-stomach pain °This list may not describe all possible side effects. Call your doctor for medical advice about side effects. You may report side effects to FDA at 1-800-FDA-1088. °Where  should I keep my medicine? °This drug is given in a hospital or clinic and will not be stored at home. °NOTE: This sheet is a summary. It may not cover all possible information. If you have questions about this medicine, talk to your doctor, pharmacist, or health care provider. °© 2018 Elsevier/Gold Standard (2015-12-23 12:41:49) ° ° ° °

## 2017-07-20 ENCOUNTER — Ambulatory Visit (HOSPITAL_COMMUNITY): Payer: Medicare Other

## 2017-07-26 ENCOUNTER — Ambulatory Visit (HOSPITAL_COMMUNITY)
Admission: RE | Admit: 2017-07-26 | Discharge: 2017-07-26 | Disposition: A | Discharge status: Home / Self Care | Payer: BLUE CROSS/BLUE SHIELD | Source: Ambulatory Visit | Attending: Nephrology | Admitting: Nephrology

## 2017-07-26 DIAGNOSIS — Z5181 Encounter for therapeutic drug level monitoring: Secondary | ICD-10-CM | POA: Diagnosis not present

## 2017-07-26 DIAGNOSIS — Z79899 Other long term (current) drug therapy: Secondary | ICD-10-CM | POA: Insufficient documentation

## 2017-07-26 DIAGNOSIS — D631 Anemia in chronic kidney disease: Secondary | ICD-10-CM | POA: Insufficient documentation

## 2017-07-26 DIAGNOSIS — N184 Chronic kidney disease, stage 4 (severe): Secondary | ICD-10-CM | POA: Diagnosis not present

## 2017-07-26 LAB — POCT HEMOGLOBIN-HEMACUE: HEMOGLOBIN: 9.7 g/dL — AB (ref 13.0–17.0)

## 2017-07-26 MED ORDER — EPOETIN ALFA 20000 UNIT/ML IJ SOLN
INTRAMUSCULAR | Status: AC
  Administered 2017-07-26: 09:00:00 20000 [IU] via SUBCUTANEOUS
  Filled 2017-07-26: qty 1

## 2017-07-26 MED ORDER — EPOETIN ALFA 20000 UNIT/ML IJ SOLN: 20000.0000 [IU] | INTRAMUSCULAR | Status: DC

## 2017-07-26 MED ORDER — FERUMOXYTOL INJECTION 510 MG/17 ML
510.0000 mg | INTRAVENOUS | Status: DC
  Administered 2017-07-26: 09:00:00 510 mg via INTRAVENOUS
  Filled 2017-07-26: qty 17

## 2017-08-09 ENCOUNTER — Encounter (HOSPITAL_COMMUNITY): Payer: Medicare Other

## 2017-08-17 ENCOUNTER — Inpatient Hospital Stay (HOSPITAL_COMMUNITY): Admission: RE | Admit: 2017-08-17 | Payer: Medicare Other | Source: Ambulatory Visit

## 2017-08-22 ENCOUNTER — Inpatient Hospital Stay (HOSPITAL_COMMUNITY): Admission: RE | Admit: 2017-08-22 | Payer: Medicare Other | Source: Ambulatory Visit

## 2017-08-23 ENCOUNTER — Ambulatory Visit (HOSPITAL_COMMUNITY)
Admission: RE | Admit: 2017-08-23 | Discharge: 2017-08-23 | Disposition: A | Discharge status: Home / Self Care | Payer: BLUE CROSS/BLUE SHIELD | Source: Ambulatory Visit | Attending: Vascular Surgery | Admitting: Vascular Surgery

## 2017-08-23 ENCOUNTER — Ambulatory Visit (INDEPENDENT_AMBULATORY_CARE_PROVIDER_SITE_OTHER): Payer: BLUE CROSS/BLUE SHIELD | Admitting: Vascular Surgery

## 2017-08-23 ENCOUNTER — Encounter: Payer: Self-pay | Admitting: Vascular Surgery

## 2017-08-23 VITALS — BP 140/76 | HR 68 | Temp 97.9°F | Resp 20 | Ht 70.0 in | Wt 214.0 lb

## 2017-08-23 DIAGNOSIS — N184 Chronic kidney disease, stage 4 (severe): Secondary | ICD-10-CM | POA: Diagnosis not present

## 2017-08-23 DIAGNOSIS — N185 Chronic kidney disease, stage 5: Secondary | ICD-10-CM | POA: Diagnosis present

## 2017-08-23 DIAGNOSIS — Z4901 Encounter for fitting and adjustment of extracorporeal dialysis catheter: Secondary | ICD-10-CM | POA: Insufficient documentation

## 2017-08-23 NOTE — Progress Notes (Signed)
Patient is a 56 year old male who returns for follow-up today. He had a left radiocephalic AV fistula placed on 03/27/2017. He is not currently on hemodialysis. He denies any numbness or tingling in his hand.  Review of systems: He denies shortness of breath. He denies chest pain. He had a recent left below-knee amputation and is currently undergoing physical therapy for this.  Physical exam:  Vitals:   08/23/17 1357  BP: 140/76  Pulse: 68  Resp: 20  Temp: 97.9 F (36.6 C)  SpO2: 100%  Weight: 214 lb (97.1 kg)  Height: 5\' 10"  (1.778 m)    Left upper extremity: Palpable thrill in fistula but overall small diameter. On compression of the mid forearm he has pulsatile flow suggesting that there is no significant inflow lesion.  Data: Duplex ultrasound was performed of the fistula today. It is less than 3 mm throughout its course.  Assessment: Non-maturing AV fistula left arm  Plan: Ligation of left radiocephalic AV fistula and placement of a left brachiocephalic fistula on 81/15/7262. Risks benefits possible complications and procedure details including but not limited to bleeding infection ischemic steal non-maturation of the fistula were discussed the patient today. He understands and agrees to proceed.  Ruta Hinds, MD [facility] Office: [phone] Pager: [phone]

## 2017-09-06 ENCOUNTER — Other Ambulatory Visit: Payer: Self-pay | Admitting: *Deleted

## 2017-09-17 ENCOUNTER — Encounter (HOSPITAL_COMMUNITY): Payer: Self-pay | Admitting: *Deleted

## 2017-09-17 NOTE — Progress Notes (Signed)
Spoke with pt for pre-op call. Pt denies cardiac history, chest pain or sob. Pt is a type 2 diabetic. Last A1C ws 6.9 on 05/30/17. Pt states his fasting blood sugar is usually around 117. Pt instructed to take 1/2 of his regular dose of Lantus Insulin tonight and in the AM (will take 6 units). Instructed pt to check his blood sugar in the AM when he gets up. If blood sugar is 70 or below, treat with 1/2 cup of clear juice (apple or cranberry) and recheck blood sugar 15 minutes after drinking juice. Pt voiced understanding.

## 2017-09-17 NOTE — Anesthesia Preprocedure Evaluation (Addendum)
Anesthesia Evaluation  Patient identified by MRN, date of birth, ID band Patient awake    Reviewed: Allergy & Precautions, NPO status , Patient's Chart, lab work & pertinent test results  History of Anesthesia Complications Negative for: history of anesthetic complications  Airway Mallampati: II  TM Distance: >3 FB Neck ROM: Full    Dental  (+) Dental Advisory Given   Pulmonary former smoker (quit 2013),    breath sounds clear to auscultation       Cardiovascular hypertension, Pt. on medications and Pt. on home beta blockers (-) angina+ Peripheral Vascular Disease   Rhythm:Regular Rate:Normal     Neuro/Psych negative neurological ROS     GI/Hepatic Neg liver ROS, GERD  Controlled,  Endo/Other  diabetes (glu 132), Insulin Dependent  Renal/GU ESRFRenal disease (no dialysis yet, K+ 4.9)     Musculoskeletal   Abdominal   Peds  Hematology negative hematology ROS (+)   Anesthesia Other Findings   Reproductive/Obstetrics                            Anesthesia Physical Anesthesia Plan  ASA: III  Anesthesia Plan: MAC   Post-op Pain Management:    Induction:   PONV Risk Score and Plan: 1 and Ondansetron and Treatment may vary due to age or medical condition  Airway Management Planned: Natural Airway and Simple Face Mask  Additional Equipment:   Intra-op Plan:   Post-operative Plan:   Informed Consent: I have reviewed the patients History and Physical, chart, labs and discussed the procedure including the risks, benefits and alternatives for the proposed anesthesia with the patient or authorized representative who has indicated his/her understanding and acceptance.   Dental advisory given  Plan Discussed with: CRNA and Surgeon  Anesthesia Plan Comments: (Plan routine monitors, MAC)        Anesthesia Quick Evaluation

## 2017-09-18 ENCOUNTER — Encounter (HOSPITAL_COMMUNITY): Payer: Self-pay | Admitting: *Deleted

## 2017-09-18 ENCOUNTER — Ambulatory Visit (HOSPITAL_COMMUNITY): Payer: BLUE CROSS/BLUE SHIELD | Admitting: Anesthesiology

## 2017-09-18 ENCOUNTER — Encounter (HOSPITAL_COMMUNITY)
Admission: RE | Disposition: A | Discharge status: Home / Self Care | Payer: Self-pay | Source: Ambulatory Visit | Attending: Vascular Surgery

## 2017-09-18 ENCOUNTER — Ambulatory Visit (HOSPITAL_COMMUNITY)
Admission: RE | Admit: 2017-09-18 | Discharge: 2017-09-18 | Disposition: A | Discharge status: Home / Self Care | Payer: BLUE CROSS/BLUE SHIELD | Source: Ambulatory Visit | Attending: Vascular Surgery | Admitting: Vascular Surgery

## 2017-09-18 ENCOUNTER — Telehealth: Payer: Self-pay | Admitting: Vascular Surgery

## 2017-09-18 DIAGNOSIS — E1122 Type 2 diabetes mellitus with diabetic chronic kidney disease: Secondary | ICD-10-CM | POA: Diagnosis not present

## 2017-09-18 DIAGNOSIS — K219 Gastro-esophageal reflux disease without esophagitis: Secondary | ICD-10-CM | POA: Insufficient documentation

## 2017-09-18 DIAGNOSIS — T82898A Other specified complication of vascular prosthetic devices, implants and grafts, initial encounter: Secondary | ICD-10-CM | POA: Diagnosis not present

## 2017-09-18 DIAGNOSIS — E1151 Type 2 diabetes mellitus with diabetic peripheral angiopathy without gangrene: Secondary | ICD-10-CM | POA: Insufficient documentation

## 2017-09-18 DIAGNOSIS — Y832 Surgical operation with anastomosis, bypass or graft as the cause of abnormal reaction of the patient, or of later complication, without mention of misadventure at the time of the procedure: Secondary | ICD-10-CM | POA: Diagnosis not present

## 2017-09-18 DIAGNOSIS — I120 Hypertensive chronic kidney disease with stage 5 chronic kidney disease or end stage renal disease: Secondary | ICD-10-CM | POA: Insufficient documentation

## 2017-09-18 DIAGNOSIS — N185 Chronic kidney disease, stage 5: Secondary | ICD-10-CM | POA: Insufficient documentation

## 2017-09-18 DIAGNOSIS — Z794 Long term (current) use of insulin: Secondary | ICD-10-CM | POA: Insufficient documentation

## 2017-09-18 DIAGNOSIS — Z89512 Acquired absence of left leg below knee: Secondary | ICD-10-CM | POA: Insufficient documentation

## 2017-09-18 DIAGNOSIS — Z87891 Personal history of nicotine dependence: Secondary | ICD-10-CM | POA: Diagnosis not present

## 2017-09-18 DIAGNOSIS — N184 Chronic kidney disease, stage 4 (severe): Secondary | ICD-10-CM

## 2017-09-18 HISTORY — DX: Peripheral vascular disease, unspecified: I73.9

## 2017-09-18 HISTORY — PX: AV FISTULA PLACEMENT: SHX1204

## 2017-09-18 HISTORY — PX: LIGATION OF ARTERIOVENOUS  FISTULA: SHX5948

## 2017-09-18 LAB — GLUCOSE, CAPILLARY
GLUCOSE-CAPILLARY: 124 mg/dL — AB (ref 65–99)
Glucose-Capillary: 124 mg/dL — ABNORMAL HIGH (ref 65–99)

## 2017-09-18 LAB — POCT I-STAT 4, (NA,K, GLUC, HGB,HCT)
Glucose, Bld: 132 mg/dL — ABNORMAL HIGH (ref 65–99)
HCT: 36 % — ABNORMAL LOW (ref 39.0–52.0)
HEMOGLOBIN: 12.2 g/dL — AB (ref 13.0–17.0)
Potassium: 4.9 mmol/L (ref 3.5–5.1)
SODIUM: 142 mmol/L (ref 135–145)

## 2017-09-18 SURGERY — ARTERIOVENOUS (AV) FISTULA CREATION
Anesthesia: Monitor Anesthesia Care | Site: Arm Upper | Laterality: Left

## 2017-09-18 MED ORDER — PROPOFOL 10 MG/ML IV BOLUS
INTRAVENOUS | Status: DC | PRN
  Administered 2017-09-18 (×2): 20 mg via INTRAVENOUS
  Administered 2017-09-18: 10 mg via INTRAVENOUS

## 2017-09-18 MED ORDER — LIDOCAINE HCL (PF) 1 % IJ SOLN
INTRAMUSCULAR | Status: AC
  Filled 2017-09-18: qty 30

## 2017-09-18 MED ORDER — HEPARIN SODIUM (PORCINE) 1000 UNIT/ML IJ SOLN
INTRAMUSCULAR | Status: DC | PRN
  Administered 2017-09-18: 5000 [IU] via INTRAVENOUS

## 2017-09-18 MED ORDER — ONDANSETRON HCL 4 MG/2ML IJ SOLN
INTRAMUSCULAR | Status: AC
  Filled 2017-09-18: qty 2

## 2017-09-18 MED ORDER — SODIUM CHLORIDE 0.9 % IV SOLN
INTRAVENOUS | Status: DC | PRN
  Administered 2017-09-18: 500 mL

## 2017-09-18 MED ORDER — OXYCODONE HCL 5 MG PO TABS: 5.0000 mg | ORAL_TABLET | Freq: Four times a day (QID) | ORAL | 0 refills | Status: DC | PRN

## 2017-09-18 MED ORDER — MIDAZOLAM HCL 2 MG/2ML IJ SOLN
INTRAMUSCULAR | Status: AC
  Filled 2017-09-18: qty 2

## 2017-09-18 MED ORDER — PROPOFOL 10 MG/ML IV BOLUS
INTRAVENOUS | Status: AC
  Filled 2017-09-18: qty 20

## 2017-09-18 MED ORDER — PROPOFOL 500 MG/50ML IV EMUL
INTRAVENOUS | Status: DC | PRN
  Administered 2017-09-18: 50 ug/kg/min via INTRAVENOUS

## 2017-09-18 MED ORDER — DEXTROSE 5 % IV SOLN
1.5000 g | INTRAVENOUS | Status: AC
  Administered 2017-09-18: 1.5 g via INTRAVENOUS

## 2017-09-18 MED ORDER — 0.9 % SODIUM CHLORIDE (POUR BTL) OPTIME
TOPICAL | Status: DC | PRN
  Administered 2017-09-18: 1000 mL

## 2017-09-18 MED ORDER — FENTANYL CITRATE (PF) 100 MCG/2ML IJ SOLN
INTRAMUSCULAR | Status: DC | PRN
  Administered 2017-09-18: 50 ug via INTRAVENOUS

## 2017-09-18 MED ORDER — LIDOCAINE HCL (PF) 1 % IJ SOLN
INTRAMUSCULAR | Status: DC | PRN
  Administered 2017-09-18: 14 mL

## 2017-09-18 MED ORDER — ONDANSETRON HCL 4 MG/2ML IJ SOLN
INTRAMUSCULAR | Status: DC | PRN
  Administered 2017-09-18: 4 mg via INTRAVENOUS

## 2017-09-18 MED ORDER — DEXTROSE 5 % IV SOLN
INTRAVENOUS | Status: AC
  Filled 2017-09-18: qty 1.5

## 2017-09-18 MED ORDER — SODIUM CHLORIDE 0.9 % IV SOLN
INTRAVENOUS | Status: DC | PRN
  Administered 2017-09-18: 07:00:00 via INTRAVENOUS

## 2017-09-18 MED ORDER — FENTANYL CITRATE (PF) 250 MCG/5ML IJ SOLN
INTRAMUSCULAR | Status: AC
  Filled 2017-09-18: qty 5

## 2017-09-18 MED ORDER — PROPOFOL 1000 MG/100ML IV EMUL
INTRAVENOUS | Status: AC
  Filled 2017-09-18: qty 100

## 2017-09-18 MED ORDER — MIDAZOLAM HCL 5 MG/5ML IJ SOLN
INTRAMUSCULAR | Status: DC | PRN
  Administered 2017-09-18: 2 mg via INTRAVENOUS

## 2017-09-18 MED ORDER — LACTATED RINGERS IV SOLN: INTRAVENOUS | Status: DC | PRN

## 2017-09-18 SURGICAL SUPPLY — 35 items
ARMBAND PINK RESTRICT EXTREMIT (MISCELLANEOUS) ×3 IMPLANT
CANISTER SUCT 3000ML PPV (MISCELLANEOUS) ×3 IMPLANT
CANNULA VESSEL 3MM 2 BLNT TIP (CANNULA) ×3 IMPLANT
CLIP VESOCCLUDE MED 6/CT (CLIP) ×3 IMPLANT
CLIP VESOCCLUDE SM WIDE 6/CT (CLIP) ×3 IMPLANT
COVER PROBE W GEL 5X96 (DRAPES) ×3 IMPLANT
DECANTER SPIKE VIAL GLASS SM (MISCELLANEOUS) ×3 IMPLANT
DERMABOND ADVANCED (GAUZE/BANDAGES/DRESSINGS) ×2
DERMABOND ADVANCED .7 DNX12 (GAUZE/BANDAGES/DRESSINGS) ×4 IMPLANT
DRAIN PENROSE 1/4X12 LTX STRL (WOUND CARE) ×3 IMPLANT
ELECT REM PT RETURN 9FT ADLT (ELECTROSURGICAL) ×3
ELECTRODE REM PT RTRN 9FT ADLT (ELECTROSURGICAL) ×2 IMPLANT
GLOVE BIO SURGEON STRL SZ7 (GLOVE) ×3 IMPLANT
GLOVE BIO SURGEON STRL SZ7.5 (GLOVE) ×3 IMPLANT
GLOVE BIOGEL PI IND STRL 6.5 (GLOVE) ×4 IMPLANT
GLOVE BIOGEL PI IND STRL 7.0 (GLOVE) ×2 IMPLANT
GLOVE BIOGEL PI INDICATOR 6.5 (GLOVE) ×2
GLOVE BIOGEL PI INDICATOR 7.0 (GLOVE) ×1
GOWN STRL REUS W/ TWL LRG LVL3 (GOWN DISPOSABLE) ×6 IMPLANT
GOWN STRL REUS W/TWL LRG LVL3 (GOWN DISPOSABLE) ×3
KIT BASIN OR (CUSTOM PROCEDURE TRAY) ×3 IMPLANT
KIT ROOM TURNOVER OR (KITS) ×3 IMPLANT
LOOP VESSEL MINI RED (MISCELLANEOUS) IMPLANT
NS IRRIG 1000ML POUR BTL (IV SOLUTION) ×3 IMPLANT
PACK CV ACCESS (CUSTOM PROCEDURE TRAY) ×3 IMPLANT
PAD ARMBOARD 7.5X6 YLW CONV (MISCELLANEOUS) ×6 IMPLANT
SPONGE SURGIFOAM ABS GEL 100 (HEMOSTASIS) IMPLANT
SUT PROLENE 6 0 BV (SUTURE) ×6 IMPLANT
SUT PROLENE 7 0 BV 1 (SUTURE) ×3 IMPLANT
SUT VIC AB 3-0 SH 27 (SUTURE) ×2
SUT VIC AB 3-0 SH 27X BRD (SUTURE) ×4 IMPLANT
SUT VIC AB 4-0 PS2 18 (SUTURE) ×3 IMPLANT
SUT VICRYL 4-0 PS2 18IN ABS (SUTURE) ×3 IMPLANT
UNDERPAD 30X30 (UNDERPADS AND DIAPERS) ×3 IMPLANT
WATER STERILE IRR 1000ML POUR (IV SOLUTION) ×3 IMPLANT

## 2017-09-18 NOTE — Anesthesia Postprocedure Evaluation (Signed)
Anesthesia Post Note  Patient: [name]  Procedure(s) Performed: LEFT ARTERIOVENOUS (AV) BRACHIOCEPHALIC FISTULA CREATION (Left Arm Upper) LIGATION OF LEFT RADIOCEPHALIC ARTERIOVENOUS  FISTULA (Left Arm Lower)     Patient location during evaluation: PACU Anesthesia Type: MAC Level of consciousness: awake and alert, patient cooperative and oriented Pain management: pain level controlled Vital Signs Assessment: post-procedure vital signs reviewed and stable Respiratory status: nonlabored ventilation, respiratory function stable, spontaneous breathing and patient connected to nasal cannula oxygen Cardiovascular status: blood pressure returned to baseline and stable Postop Assessment: no apparent nausea or vomiting Anesthetic complications: no    Last Vitals:  Vitals:   09/18/17 0900 09/18/17 0908  BP: 103/65 116/74  Pulse: 66 71  Resp: 12 12  Temp:  36.4 C  SpO2: 97% 96%    Last Pain:  Vitals:   09/18/17 0915  TempSrc:   PainSc: 0-No pain                 Alvita Fana,E. Logun Colavito

## 2017-09-18 NOTE — Op Note (Addendum)
Procedure: Left Brachial Cephalic AV fistula, ligate left radial cephalic AVF  Preop: CKD 5  Postop: CKD 5  Anesthesia: Local with IV sedation  Assistant: Leontine Locket PA-C  Findings: 3 mm cephalic vein  Procedure: After obtaining informed consent, the patient was taken to the operating room.  After adequate sedation, the left upper extremity was prepped and draped in usual sterile fashion.  Local anesthesia was infiltrated near the left wrist over a pre-existing left radial cephalic AV fistula.  The vein was dissected free adjacent to the anastomosis and the vein ligated with a 2 0 silk tie.  Local anesthesia was then infiltrated near the antecubital crease.  A transverse incision was then made near the antecubital crease the left arm. The incision was carried into the subcutaneous tissues down to level of the cephalic vein. The cephalic vein was approximately 3.5 mm in diameter. It was of good quality. This was dissected free circumferentially and small side branches ligated and divided between silk ties or clips. Next the brachial artery was dissected free in the medial portion of the incision. The artery was  3-4 mm in diameter. The vessel loops were placed proximal and distal to the planned site of arteriotomy. The patient was given 5000 units of intravenous heparin. After appropriate circulation time, the vessel loops were used to control the artery. A longitudinal opening was made in the brachial artery.  The vein was ligated distally with a 2-0 silk tie. The vein was controlled proximally with a fine bulldog clamp. The vein was then swung over to the artery and sewn end of vein to side of artery using a running 7-0 Prolene suture. Just prior to completion of the anastomosis, everything was fore bled back bled and thoroughly flushed. The anastomosis was secured, vessel loops released, and there was a palpable thrill in the fistula immediately. After hemostasis was obtained, the subcutaneous  tissues were reapproximated using a running 3-0 Vicryl suture. The skin was then closed with a 4 0 Vicryl subcuticular stitch. The wrist incision was closed with a 4 0 vicryl subcuticular stitch.  Dermabond was applied to the skin incisions.  The patient had a palpable radial pulse at the end of the case.  Ruta Hinds, MD [facility] Office: [phone] Pager: [phone]

## 2017-09-18 NOTE — Transfer of Care (Signed)
Immediate Anesthesia Transfer of Care Note  Patient: [name]  Procedure(s) Performed: LEFT ARTERIOVENOUS (AV) BRACHIOCEPHALIC FISTULA CREATION (Left Arm Upper) LIGATION OF LEFT RADIOCEPHALIC ARTERIOVENOUS  FISTULA (Left Arm Lower)  Patient Location: PACU  Anesthesia Type:MAC  Level of Consciousness: awake, alert  and oriented  Airway & Oxygen Therapy: Patient Spontanous Breathing and Patient connected to face mask oxygen  Post-op Assessment: Report given to RN and Post -op Vital signs reviewed and stable  Post vital signs: Reviewed and stable  Last Vitals:  Vitals:   09/18/17 0623 09/18/17 0854  BP: 137/71 (P) 101/64  Pulse: 72   Resp: 19   Temp: 36.9 C (P) 36.7 C  SpO2: 99%     Last Pain:  Vitals:   09/18/17 0623  TempSrc: Oral      Patients Stated Pain Goal: 3 (15/18/34 3735)  Complications: No apparent anesthesia complications

## 2017-09-18 NOTE — Telephone Encounter (Signed)
Sched appt 11/01/17; lab at 2:00, MD at 2:45. Spoke to pt.

## 2017-09-18 NOTE — Discharge Instructions (Signed)
° °  [facility]  Discharge Instructions  AV Fistula or Graft Surgery for Dialysis Access  Please refer to the following instructions for your post-procedure care. Your surgeon or physician assistant will discuss any changes with you.  Activity  You may drive the day following your surgery, if you are comfortable and no longer taking prescription pain medication. Resume full activity as the soreness in your incision resolves.  Bathing/Showering  You may shower after you go home. Keep your incision dry for 48 hours. Do not soak in a bathtub, hot tub, or swim until the incision heals completely. You may not shower if you have a hemodialysis catheter.  Incision Care  Clean your incision with mild soap and water after 48 hours. Pat the area dry with a clean towel. You do not need a bandage unless otherwise instructed. Do not apply any ointments or creams to your incision. You may have skin glue on your incision. Do not peel it off. It will come off on its own in about one week. Your arm may swell a bit after surgery. To reduce swelling use pillows to elevate your arm so it is above your heart. Your doctor will tell you if you need to lightly wrap your arm with an ACE bandage.  Diet  Resume your normal diet. There are not special food restrictions following this procedure. In order to heal from your surgery, it is CRITICAL to get adequate nutrition. Your body requires vitamins, minerals, and protein. Vegetables are the best source of vitamins and minerals. Vegetables also provide the perfect balance of protein. Processed food has little nutritional value, so try to avoid this.  Medications  Resume taking all of your medications. If your incision is causing pain, you may take over-the counter pain relievers such as acetaminophen (Tylenol). If you were prescribed a stronger pain medication, please be aware these medications can cause nausea and constipation. Prevent  nausea by taking the medication with a snack or meal. Avoid constipation by drinking plenty of fluids and eating foods with high amount of fiber, such as fruits, vegetables, and grains. Do not take Tylenol if you are taking prescription pain medications.  Follow up Your surgeon may want to see you in the office following your access surgery. If so, this will be arranged at the time of your surgery.  Please call us immediately for any of the following conditions:  Increased pain, redness, drainage (pus) from your incision site Fever of 101 degrees or higher Severe or worsening pain at your incision site Hand pain or numbness.  Reduce your risk of vascular disease:  Stop smoking. If you would like help, call [facility] at 1-800-QUIT-NOW 478-316-2871) or [place] [facility] [place] your cholesterol Maintain a desired weight Control your diabetes Keep your blood pressure down  Dialysis  It will take several weeks to several months for your new dialysis access to be ready for use. Your surgeon will determine when it is OK to use it. Your nephrologist will continue to direct your dialysis. You can continue to use your Permcath until your new access is ready for use.   09/18/2017 [name] [id number] [birth year]  Surgeon(s): Fields, Jessy Oto, MD  Procedure(s): LEFT ARTERIOVENOUS (AV) BRACHIOCEPHALIC FISTULA CREATION LIGATION OF LEFT RADIOCEPHALIC ARTERIOVENOUS  FISTULA  x Do not stick fistula for 12 weeks    If you have any questions, please call the office at [phone].

## 2017-09-18 NOTE — H&P (View-Only) (Signed)
Patient is a 56 year old male who returns for follow-up today. He had a left radiocephalic AV fistula placed on 03/27/2017. He is not currently on hemodialysis. He denies any numbness or tingling in his hand.  Review of systems: He denies shortness of breath. He denies chest pain. He had a recent left below-knee amputation and is currently undergoing physical therapy for this.  Physical exam:  Vitals:   08/23/17 1357  BP: 140/76  Pulse: 68  Resp: 20  Temp: 97.9 F (36.6 C)  SpO2: 100%  Weight: 214 lb (97.1 kg)  Height: 5\' 10"  (1.778 m)    Left upper extremity: Palpable thrill in fistula but overall small diameter. On compression of the mid forearm he has pulsatile flow suggesting that there is no significant inflow lesion.  Data: Duplex ultrasound was performed of the fistula today. It is less than 3 mm throughout its course.  Assessment: Non-maturing AV fistula left arm  Plan: Ligation of left radiocephalic AV fistula and placement of a left brachiocephalic fistula on 82/41/7530. Risks benefits possible complications and procedure details including but not limited to bleeding infection ischemic steal non-maturation of the fistula were discussed the patient today. He understands and agrees to proceed.  Ruta Hinds, MD [facility] [place] [facility] Office: [phone] Pager: [phone]

## 2017-09-18 NOTE — Telephone Encounter (Signed)
-----   Message from Mena Goes, RN sent at 09/18/2017  9:43 AM EDT ----- Regarding: 4-6 weeks with duplex   ----- Message ----- From: Gabriel Earing, PA-C Sent: 09/18/2017   8:48 AM To: [facility] Charge Pool  S/p left BC AVF 09/18/17.  F/u with Dr. Oneida Alar in 4-6 weeks with duplex.  Thanks

## 2017-09-18 NOTE — Interval H&P Note (Signed)
History and Physical Interval Note:  09/18/2017 7:16 AM  [name]  has presented today for surgery, with the diagnosis of chronic kidney disease  The various methods of treatment have been discussed with the patient and family. After consideration of risks, benefits and other options for treatment, the patient has consented to  Procedure(s): ARTERIOVENOUS (AV) BRACHIOCEPHALIC FISTULA CREATION AND LIGATION OF RADIOCEPHALIC FISTULA (Left) as a surgical intervention .  The patient's history has been reviewed, patient examined, no change in status, stable for surgery.  I have reviewed the patient's chart and labs.  Questions were answered to the patient's satisfaction.     Ruta Hinds

## 2017-09-19 ENCOUNTER — Encounter (HOSPITAL_COMMUNITY): Payer: Self-pay | Admitting: Vascular Surgery

## 2017-09-27 ENCOUNTER — Encounter: Payer: Self-pay | Admitting: Physical Therapy

## 2017-09-27 ENCOUNTER — Ambulatory Visit: Payer: BLUE CROSS/BLUE SHIELD | Attending: Student | Admitting: Physical Therapy

## 2017-09-27 DIAGNOSIS — Z9181 History of falling: Secondary | ICD-10-CM

## 2017-09-27 DIAGNOSIS — R2689 Other abnormalities of gait and mobility: Secondary | ICD-10-CM

## 2017-09-27 DIAGNOSIS — R2681 Unsteadiness on feet: Secondary | ICD-10-CM | POA: Diagnosis present

## 2017-09-27 DIAGNOSIS — M6281 Muscle weakness (generalized): Secondary | ICD-10-CM | POA: Diagnosis present

## 2017-09-27 DIAGNOSIS — M79662 Pain in left lower leg: Secondary | ICD-10-CM | POA: Diagnosis present
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
## 2017-10-02 ENCOUNTER — Encounter: Payer: Self-pay | Admitting: Physical Therapy

## 2017-10-02 ENCOUNTER — Ambulatory Visit: Payer: BLUE CROSS/BLUE SHIELD | Admitting: Physical Therapy

## 2017-10-02 DIAGNOSIS — M6281 Muscle weakness (generalized): Secondary | ICD-10-CM | POA: Diagnosis not present

## 2017-10-02 DIAGNOSIS — R2681 Unsteadiness on feet: Secondary | ICD-10-CM

## 2017-10-02 DIAGNOSIS — R2689 Other abnormalities of gait and mobility: Secondary | ICD-10-CM
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
## 2017-10-04 ENCOUNTER — Other Ambulatory Visit: Payer: Self-pay

## 2017-10-04 DIAGNOSIS — N186 End stage renal disease: Secondary | ICD-10-CM

## 2017-10-04 DIAGNOSIS — Z48812 Encounter for surgical aftercare following surgery on the circulatory system: Secondary | ICD-10-CM

## 2017-10-04 DIAGNOSIS — Z992 Dependence on renal dialysis: Principal | ICD-10-CM

## 2017-10-10 ENCOUNTER — Ambulatory Visit: Payer: BLUE CROSS/BLUE SHIELD | Attending: Student | Admitting: Physical Therapy

## 2017-10-10 ENCOUNTER — Encounter: Payer: Self-pay | Admitting: Physical Therapy

## 2017-10-10 DIAGNOSIS — M6281 Muscle weakness (generalized): Secondary | ICD-10-CM | POA: Diagnosis present

## 2017-10-10 DIAGNOSIS — R2681 Unsteadiness on feet: Secondary | ICD-10-CM | POA: Diagnosis present

## 2017-10-10 DIAGNOSIS — R2689 Other abnormalities of gait and mobility: Secondary | ICD-10-CM | POA: Diagnosis present
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
## 2017-10-10 NOTE — Patient Instructions (Signed)

## 2017-10-12 ENCOUNTER — Encounter: Payer: Self-pay | Admitting: Physical Therapy

## 2017-10-12 ENCOUNTER — Ambulatory Visit: Payer: BLUE CROSS/BLUE SHIELD | Admitting: Physical Therapy

## 2017-10-12 DIAGNOSIS — R2689 Other abnormalities of gait and mobility: Secondary | ICD-10-CM

## 2017-10-12 DIAGNOSIS — M6281 Muscle weakness (generalized): Secondary | ICD-10-CM | POA: Diagnosis not present

## 2017-10-12 DIAGNOSIS — R2681 Unsteadiness on feet: Secondary | ICD-10-CM
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
## 2017-10-16 ENCOUNTER — Encounter: Payer: Self-pay | Admitting: Physical Therapy

## 2017-10-16 ENCOUNTER — Ambulatory Visit: Payer: BLUE CROSS/BLUE SHIELD | Admitting: Physical Therapy

## 2017-10-16 DIAGNOSIS — M6281 Muscle weakness (generalized): Secondary | ICD-10-CM

## 2017-10-16 DIAGNOSIS — R2681 Unsteadiness on feet: Secondary | ICD-10-CM

## 2017-10-16 DIAGNOSIS — R2689 Other abnormalities of gait and mobility: Secondary | ICD-10-CM

## 2017-10-16 NOTE — Patient Instructions (Addendum)
Feet Apart (Compliant Surface) Head Motion - Eyes Open    With eyes open, standing on compliant surface: __one pillow_, feet shoulder width apart, move head slowly: up and down/left and right/diagonals both ways. Repeat _10___ times per session. Do __1-2_ sessions per day.  Copyright  VHI. All rights reserved.  Hip Abduction (Standing)    Stand with support. Lift right leg out to side, keeping toe forward. Bring leg down slowly.   Repeat _10__ times. Do __1-2_ times a day. Repeat with other leg.    Copyright  VHI. All rights reserved.  Hip Extension (Standing)    Stand with support. Move right leg backward with straight knee. Bring leg down slowly. Repeat __10_ times. Do _1-2__ times a day. Repeat with other leg.    Copyright  VHI. All rights reserved.
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
## 2017-10-18 ENCOUNTER — Encounter: Payer: Medicare Other | Admitting: Physical Therapy

## 2017-10-23 ENCOUNTER — Ambulatory Visit: Payer: BLUE CROSS/BLUE SHIELD | Admitting: Physical Therapy

## 2017-10-23 ENCOUNTER — Encounter: Payer: Self-pay | Admitting: Physical Therapy

## 2017-10-23 DIAGNOSIS — R2689 Other abnormalities of gait and mobility: Secondary | ICD-10-CM

## 2017-10-23 DIAGNOSIS — R2681 Unsteadiness on feet: Secondary | ICD-10-CM

## 2017-10-23 DIAGNOSIS — M6281 Muscle weakness (generalized): Secondary | ICD-10-CM | POA: Diagnosis not present
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
## 2017-10-24 ENCOUNTER — Ambulatory Visit: Payer: BLUE CROSS/BLUE SHIELD | Admitting: Physical Therapy

## 2017-10-29 ENCOUNTER — Ambulatory Visit: Payer: BLUE CROSS/BLUE SHIELD | Admitting: Physical Therapy

## 2017-10-29 DIAGNOSIS — M6281 Muscle weakness (generalized): Secondary | ICD-10-CM

## 2017-10-29 DIAGNOSIS — R2689 Other abnormalities of gait and mobility: Secondary | ICD-10-CM

## 2017-10-29 DIAGNOSIS — R2681 Unsteadiness on feet: Secondary | ICD-10-CM
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
## 2017-10-31 ENCOUNTER — Encounter: Payer: Self-pay | Admitting: Physical Therapy

## 2017-10-31 ENCOUNTER — Ambulatory Visit: Payer: BLUE CROSS/BLUE SHIELD | Admitting: Physical Therapy

## 2017-10-31 DIAGNOSIS — M6281 Muscle weakness (generalized): Secondary | ICD-10-CM | POA: Diagnosis not present

## 2017-10-31 DIAGNOSIS — R2689 Other abnormalities of gait and mobility: Secondary | ICD-10-CM

## 2017-10-31 DIAGNOSIS — R2681 Unsteadiness on feet: Secondary | ICD-10-CM
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
## 2017-10-31 NOTE — Patient Instructions (Signed)
Walking Program Begin walking for exercise for 2 minutes,3 times/day, most days/week.   Progress your walking program by adding 1-2 minutes to your routine each week, as tolerated. Be sure to wear good walking shoes, walk in a safe environment and only progress to your tolerance.      Level surfaces: practise with no cane Uneven: with cane  Practise head turns, changes in direction, changes in speed, 180 degree turns.

## 2017-11-01 ENCOUNTER — Encounter: Payer: Medicare Other | Admitting: Vascular Surgery

## 2017-11-01 ENCOUNTER — Encounter (HOSPITAL_COMMUNITY): Payer: Medicare Other

## 2017-11-06 ENCOUNTER — Ambulatory Visit: Payer: BLUE CROSS/BLUE SHIELD | Attending: Student | Admitting: Physical Therapy

## 2017-11-06 DIAGNOSIS — Z89512 Acquired absence of left leg below knee: Secondary | ICD-10-CM | POA: Insufficient documentation

## 2017-11-06 DIAGNOSIS — S88112A Complete traumatic amputation at level between knee and ankle, left lower leg, initial encounter: Secondary | ICD-10-CM

## 2017-11-06 DIAGNOSIS — R2681 Unsteadiness on feet: Secondary | ICD-10-CM | POA: Insufficient documentation

## 2017-11-06 DIAGNOSIS — M6281 Muscle weakness (generalized): Secondary | ICD-10-CM | POA: Diagnosis present

## 2017-11-06 DIAGNOSIS — R2689 Other abnormalities of gait and mobility: Secondary | ICD-10-CM | POA: Insufficient documentation
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
## 2017-11-08 ENCOUNTER — Ambulatory Visit: Payer: BLUE CROSS/BLUE SHIELD | Admitting: Physical Therapy

## 2017-11-12 ENCOUNTER — Ambulatory Visit: Payer: BLUE CROSS/BLUE SHIELD | Admitting: Physical Therapy

## 2017-11-14 ENCOUNTER — Ambulatory Visit: Payer: BLUE CROSS/BLUE SHIELD | Admitting: Physical Therapy

## 2017-11-19 ENCOUNTER — Ambulatory Visit: Payer: BLUE CROSS/BLUE SHIELD | Admitting: Physical Therapy

## 2017-11-21 ENCOUNTER — Ambulatory Visit: Payer: BLUE CROSS/BLUE SHIELD | Admitting: Physical Therapy

## 2017-11-21 ENCOUNTER — Encounter: Payer: Self-pay | Admitting: Physical Therapy

## 2017-11-21 DIAGNOSIS — M6281 Muscle weakness (generalized): Secondary | ICD-10-CM

## 2017-11-21 DIAGNOSIS — R2681 Unsteadiness on feet: Secondary | ICD-10-CM

## 2017-11-21 DIAGNOSIS — S88112A Complete traumatic amputation at level between knee and ankle, left lower leg, initial encounter: Secondary | ICD-10-CM

## 2017-11-21 DIAGNOSIS — R2689 Other abnormalities of gait and mobility: Secondary | ICD-10-CM
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
## 2017-11-23 ENCOUNTER — Encounter: Payer: Self-pay | Admitting: Physical Therapy

## 2017-11-23 ENCOUNTER — Ambulatory Visit: Payer: BLUE CROSS/BLUE SHIELD | Admitting: Physical Therapy

## 2017-11-23 DIAGNOSIS — M6281 Muscle weakness (generalized): Secondary | ICD-10-CM | POA: Diagnosis not present

## 2017-11-23 DIAGNOSIS — R2681 Unsteadiness on feet: Secondary | ICD-10-CM

## 2017-11-23 DIAGNOSIS — R2689 Other abnormalities of gait and mobility: Secondary | ICD-10-CM
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
## 2018-01-09 ENCOUNTER — Encounter (INDEPENDENT_AMBULATORY_CARE_PROVIDER_SITE_OTHER): Payer: Self-pay

## 2018-03-04 ENCOUNTER — Encounter (HOSPITAL_COMMUNITY): Payer: BLUE CROSS/BLUE SHIELD

## 2018-09-09 ENCOUNTER — Encounter (HOSPITAL_COMMUNITY): Payer: Self-pay | Admitting: Emergency Medicine

## 2018-09-09 ENCOUNTER — Emergency Department (HOSPITAL_COMMUNITY): Payer: Medicare Other

## 2018-09-09 ENCOUNTER — Other Ambulatory Visit: Payer: Self-pay

## 2018-09-09 ENCOUNTER — Inpatient Hospital Stay (HOSPITAL_COMMUNITY)
Admission: EM | Admit: 2018-09-09 | Discharge: 2018-09-20 | DRG: 515 | Disposition: A | Discharge status: Home / Self Care | Payer: Medicare Other | Attending: Internal Medicine | Admitting: Internal Medicine

## 2018-09-09 DIAGNOSIS — M869 Osteomyelitis, unspecified: Secondary | ICD-10-CM | POA: Diagnosis present

## 2018-09-09 DIAGNOSIS — E872 Acidosis: Secondary | ICD-10-CM | POA: Diagnosis present

## 2018-09-09 DIAGNOSIS — E1142 Type 2 diabetes mellitus with diabetic polyneuropathy: Secondary | ICD-10-CM | POA: Diagnosis present

## 2018-09-09 DIAGNOSIS — D638 Anemia in other chronic diseases classified elsewhere: Secondary | ICD-10-CM | POA: Diagnosis present

## 2018-09-09 DIAGNOSIS — Z89512 Acquired absence of left leg below knee: Secondary | ICD-10-CM

## 2018-09-09 DIAGNOSIS — Z419 Encounter for procedure for purposes other than remedying health state, unspecified: Secondary | ICD-10-CM

## 2018-09-09 DIAGNOSIS — Z79899 Other long term (current) drug therapy: Secondary | ICD-10-CM

## 2018-09-09 DIAGNOSIS — K219 Gastro-esophageal reflux disease without esophagitis: Secondary | ICD-10-CM | POA: Diagnosis not present

## 2018-09-09 DIAGNOSIS — N184 Chronic kidney disease, stage 4 (severe): Secondary | ICD-10-CM

## 2018-09-09 DIAGNOSIS — Z23 Encounter for immunization: Secondary | ICD-10-CM | POA: Diagnosis present

## 2018-09-09 DIAGNOSIS — Z794 Long term (current) use of insulin: Secondary | ICD-10-CM

## 2018-09-09 DIAGNOSIS — D509 Iron deficiency anemia, unspecified: Secondary | ICD-10-CM | POA: Diagnosis present

## 2018-09-09 DIAGNOSIS — L03115 Cellulitis of right lower limb: Secondary | ICD-10-CM | POA: Diagnosis present

## 2018-09-09 DIAGNOSIS — I120 Hypertensive chronic kidney disease with stage 5 chronic kidney disease or end stage renal disease: Secondary | ICD-10-CM | POA: Diagnosis present

## 2018-09-09 DIAGNOSIS — I998 Other disorder of circulatory system: Secondary | ICD-10-CM | POA: Diagnosis not present

## 2018-09-09 DIAGNOSIS — D631 Anemia in chronic kidney disease: Secondary | ICD-10-CM | POA: Diagnosis present

## 2018-09-09 DIAGNOSIS — E1122 Type 2 diabetes mellitus with diabetic chronic kidney disease: Secondary | ICD-10-CM | POA: Diagnosis present

## 2018-09-09 DIAGNOSIS — E1169 Type 2 diabetes mellitus with other specified complication: Secondary | ICD-10-CM | POA: Diagnosis present

## 2018-09-09 DIAGNOSIS — Z8249 Family history of ischemic heart disease and other diseases of the circulatory system: Secondary | ICD-10-CM

## 2018-09-09 DIAGNOSIS — K5900 Constipation, unspecified: Secondary | ICD-10-CM | POA: Diagnosis present

## 2018-09-09 DIAGNOSIS — E1151 Type 2 diabetes mellitus with diabetic peripheral angiopathy without gangrene: Secondary | ICD-10-CM | POA: Diagnosis present

## 2018-09-09 DIAGNOSIS — E876 Hypokalemia: Secondary | ICD-10-CM | POA: Diagnosis present

## 2018-09-09 DIAGNOSIS — Z833 Family history of diabetes mellitus: Secondary | ICD-10-CM

## 2018-09-09 DIAGNOSIS — Z992 Dependence on renal dialysis: Secondary | ICD-10-CM

## 2018-09-09 DIAGNOSIS — N179 Acute kidney failure, unspecified: Secondary | ICD-10-CM | POA: Diagnosis present

## 2018-09-09 DIAGNOSIS — Z87891 Personal history of nicotine dependence: Secondary | ICD-10-CM

## 2018-09-09 DIAGNOSIS — E11621 Type 2 diabetes mellitus with foot ulcer: Secondary | ICD-10-CM | POA: Diagnosis present

## 2018-09-09 DIAGNOSIS — N186 End stage renal disease: Secondary | ICD-10-CM | POA: Diagnosis present

## 2018-09-09 DIAGNOSIS — L089 Local infection of the skin and subcutaneous tissue, unspecified: Secondary | ICD-10-CM

## 2018-09-09 DIAGNOSIS — E7800 Pure hypercholesterolemia, unspecified: Secondary | ICD-10-CM | POA: Diagnosis not present

## 2018-09-09 DIAGNOSIS — E860 Dehydration: Secondary | ICD-10-CM | POA: Diagnosis not present

## 2018-09-09 DIAGNOSIS — T8789 Other complications of amputation stump: Secondary | ICD-10-CM | POA: Diagnosis present

## 2018-09-09 DIAGNOSIS — S88112S Complete traumatic amputation at level between knee and ankle, left lower leg, sequela: Secondary | ICD-10-CM

## 2018-09-09 DIAGNOSIS — E785 Hyperlipidemia, unspecified: Secondary | ICD-10-CM | POA: Diagnosis present

## 2018-09-09 DIAGNOSIS — Z66 Do not resuscitate: Secondary | ICD-10-CM | POA: Diagnosis present

## 2018-09-09 DIAGNOSIS — N189 Chronic kidney disease, unspecified: Secondary | ICD-10-CM

## 2018-09-09 DIAGNOSIS — T82858A Stenosis of vascular prosthetic devices, implants and grafts, initial encounter: Secondary | ICD-10-CM | POA: Diagnosis present

## 2018-09-09 DIAGNOSIS — Z7982 Long term (current) use of aspirin: Secondary | ICD-10-CM

## 2018-09-09 DIAGNOSIS — L97509 Non-pressure chronic ulcer of other part of unspecified foot with unspecified severity: Secondary | ICD-10-CM

## 2018-09-09 DIAGNOSIS — L97518 Non-pressure chronic ulcer of other part of right foot with other specified severity: Secondary | ICD-10-CM

## 2018-09-09 DIAGNOSIS — Z89431 Acquired absence of right foot: Secondary | ICD-10-CM

## 2018-09-09 DIAGNOSIS — I10 Essential (primary) hypertension: Secondary | ICD-10-CM | POA: Diagnosis present

## 2018-09-09 DIAGNOSIS — L89899 Pressure ulcer of other site, unspecified stage: Secondary | ICD-10-CM | POA: Diagnosis present

## 2018-09-09 DIAGNOSIS — E119 Type 2 diabetes mellitus without complications: Secondary | ICD-10-CM | POA: Diagnosis present

## 2018-09-09 DIAGNOSIS — N250 Renal osteodystrophy: Secondary | ICD-10-CM | POA: Diagnosis present

## 2018-09-09 LAB — HEPATIC FUNCTION PANEL
ALT: 30 U/L (ref 0–44)
AST: 20 U/L (ref 15–41)
Albumin: 3.2 g/dL — ABNORMAL LOW (ref 3.5–5.0)
Alkaline Phosphatase: 66 U/L (ref 38–126)
BILIRUBIN TOTAL: 0.4 mg/dL (ref 0.3–1.2)
Total Protein: 8.3 g/dL — ABNORMAL HIGH (ref 6.5–8.1)

## 2018-09-09 LAB — CBC WITH DIFFERENTIAL/PLATELET
BASOS ABS: 0 10*3/uL (ref 0.0–0.1)
BASOS PCT: 0 %
EOS PCT: 0 %
Eosinophils Absolute: 0.1 10*3/uL (ref 0.0–0.7)
HCT: 27.7 % — ABNORMAL LOW (ref 39.0–52.0)
Hemoglobin: 8.8 g/dL — ABNORMAL LOW (ref 13.0–17.0)
Lymphocytes Relative: 8 %
Lymphs Abs: 1.2 10*3/uL (ref 0.7–4.0)
MCH: 22.9 pg — ABNORMAL LOW (ref 26.0–34.0)
MCHC: 31.8 g/dL (ref 30.0–36.0)
MCV: 72.1 fL — ABNORMAL LOW (ref 78.0–100.0)
MONO ABS: 1.2 10*3/uL — AB (ref 0.1–1.0)
MONOS PCT: 8 %
Neutro Abs: 12.3 10*3/uL — ABNORMAL HIGH (ref 1.7–7.7)
Neutrophils Relative %: 84 %
PLATELETS: 295 10*3/uL (ref 150–400)
RBC: 3.84 MIL/uL — ABNORMAL LOW (ref 4.22–5.81)
RDW: 15.8 % — AB (ref 11.5–15.5)
WBC: 14.8 10*3/uL — ABNORMAL HIGH (ref 4.0–10.5)

## 2018-09-09 LAB — RETICULOCYTES
RBC.: 3.81 MIL/uL — ABNORMAL LOW (ref 4.22–5.81)
Retic Count, Absolute: 68.6 10*3/uL (ref 19.0–186.0)
Retic Ct Pct: 1.8 % (ref 0.4–3.1)

## 2018-09-09 LAB — BASIC METABOLIC PANEL
Anion gap: 18 — ABNORMAL HIGH (ref 5–15)
BUN: 118 mg/dL — AB (ref 6–20)
CALCIUM: 6.2 mg/dL — AB (ref 8.9–10.3)
CO2: 11 mmol/L — ABNORMAL LOW (ref 22–32)
Chloride: 111 mmol/L (ref 98–111)
Creatinine, Ser: 9.37 mg/dL — ABNORMAL HIGH (ref 0.61–1.24)
GFR calc Af Amer: 6 mL/min — ABNORMAL LOW (ref >60)
GFR, EST NON AFRICAN AMERICAN: 5 mL/min — AB (ref >60)
GLUCOSE: 160 mg/dL — AB (ref 70–99)
Potassium: 3.9 mmol/L (ref 3.5–5.1)
Sodium: 140 mmol/L (ref 135–145)

## 2018-09-09 LAB — CBG MONITORING, ED: GLUCOSE-CAPILLARY: 154 mg/dL — AB (ref 70–99)

## 2018-09-09 LAB — IRON AND TIBC
IRON: 42 ug/dL — AB (ref 45–182)
Saturation Ratios: 21 % (ref 17.9–39.5)
TIBC: 204 ug/dL — AB (ref 250–450)
UIBC: 162 ug/dL

## 2018-09-09 LAB — FERRITIN: Ferritin: 841 ng/mL — ABNORMAL HIGH (ref 24–336)

## 2018-09-09 LAB — VITAMIN B12: VITAMIN B 12: 655 pg/mL (ref 180–914)

## 2018-09-09 LAB — MAGNESIUM: Magnesium: 1.2 mg/dL — ABNORMAL LOW (ref 1.7–2.4)

## 2018-09-09 LAB — I-STAT CG4 LACTIC ACID, ED: Lactic Acid, Venous: 0.56 mmol/L (ref 0.5–1.9)

## 2018-09-09 LAB — SEDIMENTATION RATE: Sed Rate: 104 mm/hr — ABNORMAL HIGH (ref 0–16)

## 2018-09-09 LAB — C-REACTIVE PROTEIN: CRP: 4.7 mg/dL — AB (ref <1.0)

## 2018-09-09 LAB — PHOSPHORUS: Phosphorus: 7.6 mg/dL — ABNORMAL HIGH (ref 2.5–4.6)

## 2018-09-09 MED ORDER — PIPERACILLIN-TAZOBACTAM 3.375 G IVPB 30 MIN: 3.3750 g | Freq: Once | INTRAVENOUS | Status: DC

## 2018-09-09 MED ORDER — INSULIN ASPART 100 UNIT/ML ~~LOC~~ SOLN
0.0000 [IU] | SUBCUTANEOUS | Status: DC
  Administered 2018-09-10: 1 [IU] via SUBCUTANEOUS
  Administered 2018-09-10: 3 [IU] via SUBCUTANEOUS
  Administered 2018-09-11: 5 [IU] via SUBCUTANEOUS
  Administered 2018-09-11 (×2): 1 [IU] via SUBCUTANEOUS
  Administered 2018-09-11 (×2): 2 [IU] via SUBCUTANEOUS
  Administered 2018-09-12 (×2): 3 [IU] via SUBCUTANEOUS
  Administered 2018-09-13 (×2): 1 [IU] via SUBCUTANEOUS
  Administered 2018-09-13: 3 [IU] via SUBCUTANEOUS
  Administered 2018-09-13: 2 [IU] via SUBCUTANEOUS
  Administered 2018-09-14 (×2): 1 [IU] via SUBCUTANEOUS
  Administered 2018-09-14: 3 [IU] via SUBCUTANEOUS
  Administered 2018-09-14 – 2018-09-15 (×3): 2 [IU] via SUBCUTANEOUS
  Administered 2018-09-15: 3 [IU] via SUBCUTANEOUS
  Administered 2018-09-15: 1 [IU] via SUBCUTANEOUS
  Administered 2018-09-15 (×3): 2 [IU] via SUBCUTANEOUS
  Administered 2018-09-16 (×2): 1 [IU] via SUBCUTANEOUS
  Administered 2018-09-16: 3 [IU] via SUBCUTANEOUS
  Administered 2018-09-16: 2 [IU] via SUBCUTANEOUS
  Administered 2018-09-17 (×3): 3 [IU] via SUBCUTANEOUS
  Administered 2018-09-17 (×2): 2 [IU] via SUBCUTANEOUS
  Administered 2018-09-17: 1 [IU] via SUBCUTANEOUS
  Administered 2018-09-17: 3 [IU] via SUBCUTANEOUS
  Administered 2018-09-18: 2 [IU] via SUBCUTANEOUS
  Administered 2018-09-18: 5 [IU] via SUBCUTANEOUS
  Administered 2018-09-18 (×2): 3 [IU] via SUBCUTANEOUS

## 2018-09-09 MED ORDER — GABAPENTIN 300 MG PO CAPS
300.0000 mg | ORAL_CAPSULE | Freq: Every day | ORAL | Status: DC
  Administered 2018-09-10 – 2018-09-19 (×11): 300 mg via ORAL
  Filled 2018-09-09 (×11): qty 1

## 2018-09-09 MED ORDER — ONDANSETRON HCL 4 MG PO TABS
4.0000 mg | ORAL_TABLET | Freq: Four times a day (QID) | ORAL | Status: DC | PRN
  Administered 2018-09-11: 4 mg via ORAL
  Filled 2018-09-09: qty 1

## 2018-09-09 MED ORDER — ONDANSETRON HCL 4 MG/2ML IJ SOLN
4.0000 mg | Freq: Four times a day (QID) | INTRAMUSCULAR | Status: DC | PRN
  Administered 2018-09-13: 4 mg via INTRAVENOUS
  Filled 2018-09-09: qty 2

## 2018-09-09 MED ORDER — OXYCODONE HCL 5 MG PO TABS
5.0000 mg | ORAL_TABLET | Freq: Four times a day (QID) | ORAL | Status: DC | PRN
  Administered 2018-09-10 – 2018-09-16 (×14): 5 mg via ORAL
  Filled 2018-09-09 (×15): qty 1

## 2018-09-09 MED ORDER — SODIUM CHLORIDE 0.9 % IV SOLN
2.0000 g | Freq: Every day | INTRAVENOUS | Status: DC
  Administered 2018-09-10 – 2018-09-16 (×8): 2 g via INTRAVENOUS
  Filled 2018-09-09 (×9): qty 20

## 2018-09-09 MED ORDER — ACETAMINOPHEN 650 MG RE SUPP: 650.0000 mg | Freq: Four times a day (QID) | RECTAL | Status: DC | PRN

## 2018-09-09 MED ORDER — ATORVASTATIN CALCIUM 10 MG PO TABS
10.0000 mg | ORAL_TABLET | Freq: Every day | ORAL | Status: DC
  Administered 2018-09-10 – 2018-09-19 (×11): 10 mg via ORAL
  Filled 2018-09-09 (×11): qty 1

## 2018-09-09 MED ORDER — POLYETHYLENE GLYCOL 3350 17 G PO PACK: 17.0000 g | PACK | Freq: Every day | ORAL | Status: DC | PRN

## 2018-09-09 MED ORDER — ACETAMINOPHEN 325 MG PO TABS
650.0000 mg | ORAL_TABLET | Freq: Four times a day (QID) | ORAL | Status: DC | PRN
  Filled 2018-09-09: qty 2

## 2018-09-09 MED ORDER — INSULIN GLARGINE 100 UNIT/ML ~~LOC~~ SOLN
10.0000 [IU] | Freq: Two times a day (BID) | SUBCUTANEOUS | Status: DC
  Administered 2018-09-10 – 2018-09-20 (×20): 10 [IU] via SUBCUTANEOUS
  Filled 2018-09-09 (×24): qty 0.1

## 2018-09-09 MED ORDER — CARVEDILOL 25 MG PO TABS
25.0000 mg | ORAL_TABLET | Freq: Two times a day (BID) | ORAL | Status: DC
  Administered 2018-09-10 – 2018-09-16 (×10): 25 mg via ORAL
  Filled 2018-09-09 (×11): qty 1

## 2018-09-09 MED ORDER — SODIUM BICARBONATE 650 MG PO TABS
650.0000 mg | ORAL_TABLET | Freq: Two times a day (BID) | ORAL | Status: DC
  Administered 2018-09-10 – 2018-09-11 (×4): 650 mg via ORAL
  Filled 2018-09-09 (×4): qty 1

## 2018-09-09 MED ORDER — PIPERACILLIN-TAZOBACTAM IN DEX 2-0.25 GM/50ML IV SOLN
2.2500 g | Freq: Once | INTRAVENOUS | Status: AC
  Administered 2018-09-09: 2.25 g via INTRAVENOUS
  Filled 2018-09-09: qty 50

## 2018-09-09 MED ORDER — VANCOMYCIN HCL 10 G IV SOLR
2000.0000 mg | Freq: Once | INTRAVENOUS | Status: AC
  Administered 2018-09-09: 2000 mg via INTRAVENOUS
  Filled 2018-09-09: qty 2000

## 2018-09-09 MED ORDER — SODIUM CHLORIDE 0.9 % IV SOLN
1.0000 g | Freq: Once | INTRAVENOUS | Status: AC
  Administered 2018-09-09: 1 g via INTRAVENOUS
  Filled 2018-09-09: qty 10

## 2018-09-09 MED ORDER — SODIUM BICARBONATE 8.4 % IV SOLN
INTRAVENOUS | Status: DC
  Administered 2018-09-09 – 2018-09-11 (×4): via INTRAVENOUS
  Filled 2018-09-09 (×4): qty 150

## 2018-09-09 MED ORDER — HYDROCODONE-ACETAMINOPHEN 5-325 MG PO TABS
1.0000 | ORAL_TABLET | ORAL | Status: DC | PRN
  Administered 2018-09-10: 1 via ORAL
  Administered 2018-09-10 – 2018-09-17 (×10): 2 via ORAL
  Filled 2018-09-09: qty 2
  Filled 2018-09-09: qty 1
  Filled 2018-09-09 (×9): qty 2

## 2018-09-09 MED ORDER — AMLODIPINE BESYLATE 10 MG PO TABS
10.0000 mg | ORAL_TABLET | Freq: Every day | ORAL | Status: DC
  Administered 2018-09-10 – 2018-09-19 (×11): 10 mg via ORAL
  Filled 2018-09-09 (×11): qty 1

## 2018-09-09 MED ORDER — METRONIDAZOLE IN NACL 5-0.79 MG/ML-% IV SOLN
500.0000 mg | Freq: Three times a day (TID) | INTRAVENOUS | Status: DC
  Administered 2018-09-10 – 2018-09-17 (×19): 500 mg via INTRAVENOUS
  Filled 2018-09-09 (×19): qty 100
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
## 2018-09-09 NOTE — Progress Notes (Signed)
A consult was received from an ED physician for Vancomycin per pharmacy dosing. The patient's profile has been reviewed for ht/wt/allergies/indication/available labs.    A one time order has been placed for Vancomycin 2g IV. Per discussion with ED provider, Zosyn dose has also been decreased to 2.25g IV due to renal function. Further antibiotics/pharmacy consults should be ordered by admitting physician if indicated.                       Thank you, Luiz Ochoa 09/09/2018  9:10 PM

## 2018-09-09 NOTE — ED Triage Notes (Signed)
Patient complains of right foot pain, weakness and nausea/ vomiting for 1 week.

## 2018-09-10 ENCOUNTER — Inpatient Hospital Stay (HOSPITAL_COMMUNITY): Payer: Medicare Other

## 2018-09-10 DIAGNOSIS — N185 Chronic kidney disease, stage 5: Secondary | ICD-10-CM

## 2018-09-10 DIAGNOSIS — L0390 Cellulitis, unspecified: Secondary | ICD-10-CM

## 2018-09-10 DIAGNOSIS — I998 Other disorder of circulatory system: Secondary | ICD-10-CM

## 2018-09-10 LAB — GLUCOSE, CAPILLARY
GLUCOSE-CAPILLARY: 134 mg/dL — AB (ref 70–99)
GLUCOSE-CAPILLARY: 196 mg/dL — AB (ref 70–99)
Glucose-Capillary: 135 mg/dL — ABNORMAL HIGH (ref 70–99)
Glucose-Capillary: 147 mg/dL — ABNORMAL HIGH (ref 70–99)
Glucose-Capillary: 150 mg/dL — ABNORMAL HIGH (ref 70–99)
Glucose-Capillary: 165 mg/dL — ABNORMAL HIGH (ref 70–99)
Glucose-Capillary: 212 mg/dL — ABNORMAL HIGH (ref 70–99)

## 2018-09-10 LAB — MRSA PCR SCREENING: MRSA BY PCR: POSITIVE — AB

## 2018-09-10 LAB — CBC WITH DIFFERENTIAL/PLATELET
Abs Immature Granulocytes: 0.2 10*3/uL — ABNORMAL HIGH (ref 0.00–0.07)
BASOS ABS: 0.1 10*3/uL (ref 0.0–0.1)
Basophils Relative: 0 %
EOS PCT: 1 %
Eosinophils Absolute: 0.1 10*3/uL (ref 0.0–0.5)
HCT: 29.4 % — ABNORMAL LOW (ref 39.0–52.0)
Hemoglobin: 8.9 g/dL — ABNORMAL LOW (ref 13.0–17.0)
IMMATURE GRANULOCYTES: 1 %
LYMPHS ABS: 1.3 10*3/uL (ref 0.7–4.0)
LYMPHS PCT: 10 %
MCH: 22.9 pg — ABNORMAL LOW (ref 26.0–34.0)
MCHC: 30.3 g/dL (ref 30.0–36.0)
MCV: 75.6 fL — ABNORMAL LOW (ref 80.0–100.0)
Monocytes Absolute: 1.5 10*3/uL — ABNORMAL HIGH (ref 0.1–1.0)
Monocytes Relative: 11 %
NEUTROS ABS: 10.2 10*3/uL — AB (ref 1.7–7.7)
NEUTROS PCT: 77 %
Platelets: 281 10*3/uL (ref 150–400)
RBC: 3.89 MIL/uL — AB (ref 4.22–5.81)
RDW: 15.9 % — ABNORMAL HIGH (ref 11.5–15.5)
WBC: 13.2 10*3/uL — ABNORMAL HIGH (ref 4.0–10.5)

## 2018-09-10 LAB — FERRITIN: Ferritin: 735 ng/mL — ABNORMAL HIGH (ref 24–336)

## 2018-09-10 LAB — BLOOD GAS, VENOUS
ACID-BASE DEFICIT: 14.5 mmol/L — AB (ref 0.0–2.0)
BICARBONATE: 12.3 mmol/L — AB (ref 20.0–28.0)
O2 Saturation: 74.4 %
PATIENT TEMPERATURE: 98.6
pCO2, Ven: 34.6 mmHg — ABNORMAL LOW (ref 44.0–60.0)
pH, Ven: 7.176 — CL (ref 7.250–7.430)
pO2, Ven: 47.6 mmHg — ABNORMAL HIGH (ref 32.0–45.0)

## 2018-09-10 LAB — ALT: ALT: 24 U/L (ref 0–44)

## 2018-09-10 LAB — FOLATE: Folate: 7.1 ng/mL (ref >5.9)

## 2018-09-10 LAB — URINALYSIS, ROUTINE W REFLEX MICROSCOPIC
Bacteria, UA: NONE SEEN
Bilirubin Urine: NEGATIVE
GLUCOSE, UA: 50 mg/dL — AB
KETONES UR: NEGATIVE mg/dL
LEUKOCYTES UA: NEGATIVE
Nitrite: NEGATIVE
PH: 5 (ref 5.0–8.0)
Protein, ur: 100 mg/dL — AB
SPECIFIC GRAVITY, URINE: 1.011 (ref 1.005–1.030)

## 2018-09-10 LAB — HEMOGLOBIN A1C
HEMOGLOBIN A1C: 7.5 % — AB (ref 4.8–5.6)
MEAN PLASMA GLUCOSE: 168.55 mg/dL

## 2018-09-10 LAB — COMPREHENSIVE METABOLIC PANEL
ALT: 26 U/L (ref 0–44)
ANION GAP: 14 (ref 5–15)
AST: 18 U/L (ref 15–41)
Albumin: 2.9 g/dL — ABNORMAL LOW (ref 3.5–5.0)
Alkaline Phosphatase: 55 U/L (ref 38–126)
BILIRUBIN TOTAL: 0.6 mg/dL (ref 0.3–1.2)
BUN: 120 mg/dL — ABNORMAL HIGH (ref 6–20)
CHLORIDE: 114 mmol/L — AB (ref 98–111)
CO2: 11 mmol/L — ABNORMAL LOW (ref 22–32)
Calcium: 6.5 mg/dL — ABNORMAL LOW (ref 8.9–10.3)
Creatinine, Ser: 9.09 mg/dL — ABNORMAL HIGH (ref 0.61–1.24)
GFR, EST AFRICAN AMERICAN: 6 mL/min — AB (ref >60)
GFR, EST NON AFRICAN AMERICAN: 6 mL/min — AB (ref >60)
Glucose, Bld: 153 mg/dL — ABNORMAL HIGH (ref 70–99)
Potassium: 3.9 mmol/L (ref 3.5–5.1)
Sodium: 139 mmol/L (ref 135–145)
TOTAL PROTEIN: 7.7 g/dL (ref 6.5–8.1)

## 2018-09-10 LAB — HIV ANTIBODY (ROUTINE TESTING W REFLEX): HIV SCREEN 4TH GENERATION: NONREACTIVE

## 2018-09-10 LAB — TSH: TSH: 3.274 u[IU]/mL (ref 0.350–4.500)

## 2018-09-10 LAB — PHOSPHORUS: PHOSPHORUS: 7.6 mg/dL — AB (ref 2.5–4.6)

## 2018-09-10 LAB — MAGNESIUM: MAGNESIUM: 1.1 mg/dL — AB (ref 1.7–2.4)

## 2018-09-10 LAB — IRON AND TIBC
IRON: 39 ug/dL — AB (ref 45–182)
SATURATION RATIOS: 24 % (ref 17.9–39.5)
TIBC: 165 ug/dL — AB (ref 250–450)
UIBC: 126 ug/dL

## 2018-09-10 LAB — PREALBUMIN: Prealbumin: 22.9 mg/dL (ref 18–38)

## 2018-09-10 MED ORDER — PENTAFLUOROPROP-TETRAFLUOROETH EX AERO: 1.0000 "application " | INHALATION_SPRAY | CUTANEOUS | Status: DC | PRN

## 2018-09-10 MED ORDER — SODIUM CHLORIDE 0.9 % IV SOLN: 100.0000 mL | INTRAVENOUS | Status: DC | PRN

## 2018-09-10 MED ORDER — LIDOCAINE HCL (PF) 1 % IJ SOLN: 5.0000 mL | INTRAMUSCULAR | Status: DC | PRN

## 2018-09-10 MED ORDER — HEPARIN SODIUM (PORCINE) 1000 UNIT/ML DIALYSIS
2000.0000 [IU] | Freq: Once | INTRAMUSCULAR | Status: AC
  Administered 2018-09-10: 2000 [IU] via INTRAVENOUS_CENTRAL

## 2018-09-10 MED ORDER — HEPARIN SODIUM (PORCINE) 1000 UNIT/ML DIALYSIS
1000.0000 [IU] | INTRAMUSCULAR | Status: DC | PRN
  Administered 2018-09-14: 3200 [IU] via INTRAVENOUS_CENTRAL
  Filled 2018-09-10 (×2): qty 1

## 2018-09-10 MED ORDER — NEPRO/CARBSTEADY PO LIQD
237.0000 mL | Freq: Two times a day (BID) | ORAL | Status: DC
  Administered 2018-09-10 – 2018-09-20 (×10): 237 mL via ORAL
  Filled 2018-09-10 (×4): qty 237

## 2018-09-10 MED ORDER — RENA-VITE PO TABS
1.0000 | ORAL_TABLET | Freq: Every day | ORAL | Status: DC
  Administered 2018-09-10 – 2018-09-19 (×10): 1 via ORAL
  Filled 2018-09-10 (×10): qty 1

## 2018-09-10 MED ORDER — ASPIRIN EC 81 MG PO TBEC
81.0000 mg | DELAYED_RELEASE_TABLET | Freq: Every day | ORAL | Status: DC
  Administered 2018-09-10 – 2018-09-20 (×10): 81 mg via ORAL
  Filled 2018-09-10 (×10): qty 1

## 2018-09-10 MED ORDER — ALTEPLASE 2 MG IJ SOLR: 2.0000 mg | Freq: Once | INTRAMUSCULAR | Status: DC | PRN

## 2018-09-10 MED ORDER — CHLORHEXIDINE GLUCONATE CLOTH 2 % EX PADS
6.0000 | MEDICATED_PAD | Freq: Every day | CUTANEOUS | Status: DC
  Administered 2018-09-11 – 2018-09-18 (×8): 6 via TOPICAL

## 2018-09-10 MED ORDER — MAGNESIUM SULFATE 4 GM/100ML IV SOLN
4.0000 g | Freq: Once | INTRAVENOUS | Status: AC
  Administered 2018-09-10: 4 g via INTRAVENOUS
  Filled 2018-09-10: qty 100

## 2018-09-10 MED ORDER — CALCIUM ACETATE (PHOS BINDER) 667 MG PO CAPS
1334.0000 mg | ORAL_CAPSULE | Freq: Three times a day (TID) | ORAL | Status: DC
  Administered 2018-09-10 – 2018-09-11 (×4): 1334 mg via ORAL
  Filled 2018-09-10 (×4): qty 2

## 2018-09-10 MED ORDER — MUPIROCIN 2 % EX OINT
TOPICAL_OINTMENT | Freq: Two times a day (BID) | CUTANEOUS | Status: DC
  Administered 2018-09-10 – 2018-09-19 (×17): via NASAL
  Administered 2018-09-19 – 2018-09-20 (×2): 1 via NASAL
  Filled 2018-09-10 (×5): qty 22

## 2018-09-10 MED ORDER — LIDOCAINE-PRILOCAINE 2.5-2.5 % EX CREA
1.0000 "application " | TOPICAL_CREAM | CUTANEOUS | Status: DC | PRN
  Filled 2018-09-10: qty 5

## 2018-09-10 NOTE — Progress Notes (Signed)
VASCULAR LAB PRELIMINARY  ARTERIAL  ABI completed:    RIGHT    LEFT    PRESSURE WAVEFORM  PRESSURE WAVEFORM  BRACHIAL 114 triphasic BRACHIAL  Restricted extremity  DP 53 monophasic DP  Below the knee amputation   AT   AT    PT 66 monophasic PT  Below the knee amputation                  RIGHT LEFT  ABI 0.58 Unable to obtain due to below the knee amputation   Right ABI indicates moderate right lower extremity arterial disease.  [name] 09/10/2018, 11:13 AM

## 2018-09-10 NOTE — Progress Notes (Signed)
HD tx initiated via 17Gx3, difficult 1st time ever cannulation of AVF as it is very underdeveloped for the amt of time it has been in place, art stick pull/push/flush well but but 1st attempt at venous was infiltrated, second attempt successful, all attempts by [name] Brace, RN, VSS, will cont to monitor while on HD tx

## 2018-09-10 NOTE — Consult Note (Signed)
[facility] Nurse wound consult note Patient evaluated in [place] [phone].  No family present. Reason for Consult: Right foot wound Wound type: Trauma from abrasion Injury POA: Yes Measurement: 0.4 cm x 2 cm x no measurable depth Wound bed: 100% pink, clean, no odor, no drainage. Periwound: Plantar aspect of periwound is callused, slightly discolored yellow/brown.  No drainage expressed from wound.  Patient states the area has been open for 2 weeks. The periwound is not indurated or fluctuant. Dressing procedure/placement/frequency: Foam dressing, change every 3 days.  Next change date is 09/12/18. Monitor the wound area(s) for worsening of condition such as: Signs/symptoms of infection,  Increase in size,  Development of or worsening of odor, Development of pain, or increased pain at the affected locations.  Notify the medical team if any of these develop.  Thank you for the consult.  Discussed plan of care with the patient and bedside nurse.  [facility] nurse will not follow at this time.  Please re-consult the [facility] team if needed.  Val Riles, RN, MSN, CWOCN, CNS-BC, pager [phone]
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
## 2018-09-10 NOTE — Progress Notes (Signed)
Inpatient Diabetes Program Recommendations  AACE/ADA: New Consensus Statement on Inpatient Glycemic Control (2015)  Target Ranges:  Prepandial:   less than 140 mg/dL      Peak postprandial:   less than 180 mg/dL (1-2 hours)      Critically ill patients:  140 - 180 mg/dL   Lab Results  Component Value Date   GLUCAP 150 (H) 09/10/2018   HGBA1C 7.5 (H) 09/10/2018    Review of Glycemic Control Results for NOAH, LEMBKE (MRN [id number]) as of 09/10/2018 12:01  Ref. Range 09/10/2018 04:29 09/10/2018 07:49 09/10/2018 11:26  Glucose-Capillary Latest Ref Range: 70 - 99 mg/dL 134 (H) 135 (H) 150 (H)   Diabetes history: Type 2 DM Outpatient Diabetes medications: Lantus 10 units BID Current orders for Inpatient glycemic control: Lantus 10 units BID, Novolog 0-9 units Q4H  Inpatient Diabetes Program Recommendations:     Noted consult placed per wound extremity order set. Blood sugars trending well. Will continue to follow.   Thanks, Bronson Curb, MSN, RNC-OB Diabetes Coordinator [phone] (8a-5p)
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
## 2018-09-10 NOTE — Progress Notes (Signed)
Pharmacy Antibiotic Note  [name] is a 57 y.o. male admitted on 09/09/2018 with right foot pain and previous left BKA.  Pharmacy has been consulted for vancomycin dosing for right foot ulcer and possible osteomyelitis. 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 He had vancomycin 2 g IV on 10/7 at 21:34; also on ceftriaxone and Flagyl. Plan for HD is 2.5 hr for max BFR 200.  Plan: No supplemental vancomycin needed tonight after 1st HD session Check vancomycin level in next couple days depending on HD schedule Re-dose when vancomycin level <20  Height: 6\' 4"  (193 cm) Weight: 218 lb 4.1 oz (99 kg) IBW/kg (Calculated) : 86.8  Temp (24hrs), Avg:98.1 F (36.7 C), Min:97.8 F (36.6 C), Max:98.5 F (36.9 C)  Recent Labs  Lab 09/09/18 1951 09/09/18 2000 09/10/18 0034  WBC 14.8*  --  13.2*  CREATININE 9.37*  --  9.09*  LATICACIDVEN  --  0.56  --     Estimated Creatinine Clearance: 10.7 mL/min (A) (by C-G formula based on SCr of 9.09 mg/dL (H)).    No Known Allergies  Antimicrobials this admission: Vancomycin 10/7 >> Zosyn 2.25 g x1 10/7 Ceftriaxone 10/8 >>  Flagyl 10/8 >>   Dose adjustments this admission:   Microbiology results: 10/8 BCx: pending 10/8 MRSA PCR: positive  Thank you for allowing pharmacy to be a part of this patient's care.  Renold Genta, PharmD, BCPS Clinical Pharmacist Clinical phone for 09/10/2018 until 10p is [phone] Please check AMION for all Pharmacist numbers by unit 09/10/2018 4:45 PM

## 2018-09-10 NOTE — Progress Notes (Signed)
HD tx completed w/o problem, UF goal of keep even met, blood rinsed back, VSS, report called to Fredrich Romans, RN

## 2018-09-10 NOTE — Progress Notes (Signed)
Spoke with Dr. Nelia Shi concerning contrast administration he felt the need for contrast was not necessary.  I contact Silvestre Gunner to have the order changed to MR Foot Right Without Contrast.

## 2018-09-10 NOTE — Evaluation (Signed)
Physical Therapy Evaluation Patient Details Name: [name] MRN: [id number] DOB: 10/23/1961 Today's Date: 09/10/2018   History of Present Illness  [name] is a 57 y.o. male with medical history significant of CKD, left below-knee amputation, right transmetatarsal foot amputation, hypertension, diabetes, peripheral neuropathy, hyperlipidemia, admitted with worsening fatigue, weakness, lightheadedness and nausea and vomiting -- with admitting diagnosis of Renal failure, and now starting HD; Also in the past 2 weeks, pt reports an ulcer on R foot, lateral aspect near transmet amputation, as well as L prosthesis more painful, leading to less walking, and functional decline on top of Renal Failure.  Clinical Impression   Pt admitted with above diagnosis. Pt currently with functional limitations due to the deficits listed below (see PT Problem List). Independent walking, driving, working normally, typically no need for assistive device; Gradual progressive weakness leading up to this admission; Also, with painful prosthesis fit for a few weeks prior to admission, which potentially lead to pt favoring [name] and putting different pressure forces through R foot, possibly leading to R foot wound;   Presents with generalized weakness, fatigue, decr activity tolerance, and decr functional mobility; Placed call to [employer], and spoke to [name], [name], to take a look at L prosthesis fit and R shoe insert; [name] plans to come see pt in the hospital;   Have set PT goals at walking/gait level, but if unable to get a good fit of L prosthesis, I very much hesitate to walk with an ill-fitting prosthesis due to risk to [name] skin; If unable to get good fit for L prosthesis and R shoe insert, May need to adjust goals down to wheelchair level; I anticipate he will be able to reach modified independent functional level at wheelchair and/or gait level; Pt will benefit from skilled PT to increase  their independence and safety with mobility to allow discharge to the venue listed below.       Follow Up Recommendations Home health PT;Outpatient PT;Other (comment)(Depends on progress and response to initiation of HD, as well as OT evaluation for ADLs); Will monitor -- if he does not progress as anticipated, then we may consider post-acute rehab   Equipment Recommendations  Other (comment)(Consider wheelchair if he doesn't already have one)    Recommendations for Other Services (Evaluation of Prosthesis and R shoe insert by [name], [name]'s [name], with [employer])     Precautions / Restrictions Precautions Precautions: Fall Precaution Comments: Be mindful of pressure on R lateral aspect of foot, and of prosthesis fit/pain Required Braces or Orthoses: Other Brace/Splint Other Brace/Splint: Shoe insert/orthosis R foot; Prosthesis L LE Restrictions Weight Bearing Restrictions: No Other Position/Activity Restrictions: Be mindful of pressure on R lateral aspect of foot, and of prosthesis fit/pain      Mobility  Bed Mobility Overal bed mobility: Needs Assistance Bed Mobility: Supine to Sit     Supine to sit: Min assist     General bed mobility comments: Min handheld assist to pull to sit  Transfers Overall transfer level: Needs assistance Equipment used: Rolling walker (2 wheeled) Transfers: Sit to/from Stand Sit to Stand: Mod assist         General transfer comment: Stood x 3 from bed, heavy dependence on UEs for support and balance; first 2 times without RW, 3rd time stood to RW; Cues to push down into RW to take pressure off of painful L residual limb; trunk flexed; [name] tells me the simple act of standing made him completely exhausted  Ambulation/Gait Ambulation/Gait assistance: Min assist Gait Distance (Feet): (2 march-in-place steps at EOB) Assistive device: 1 person hand held assist       General Gait Details: cues to push down into RW to take  pressure off of LEs  Stairs            Wheelchair Mobility    Modified Rankin (Stroke Patients Only)       Balance Overall balance assessment: Needs assistance Sitting-balance support: Bilateral upper extremity supported;No upper extremity supported Sitting balance-Leahy Scale: Good       Standing balance-Leahy Scale: Poor Standing balance comment: Painful R foot and L Residual limb in standing                             Pertinent Vitals/Pain Pain Assessment: Faces Faces Pain Scale: Hurts even more Pain Location: Most pain at L Residual limb with weight bearing through prosthesis Pain Descriptors / Indicators: Aching;Grimacing;Guarding;Discomfort;Pressure Pain Intervention(s): Monitored during session    Home Living Family/patient expects to be discharged to:: Private residence Living Arrangements: Spouse/significant other Available Help at Discharge: Family;Available 24 hours/day(Cannot provide physical assist) Type of Home: House Home Access: Level entry     Home Layout: One level Home Equipment: Cane - quad;Cane - single point;Walker - 2 wheels(May already have a wheelchair, need to verify)      Prior Function Level of Independence: Independent         Comments: Completely independent, driving, 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     Hand Dominance   Dominant Hand: Right    Extremity/Trunk Assessment   Upper Extremity Assessment Upper Extremity Assessment: Defer to OT evaluation    Lower Extremity Assessment Lower Extremity Assessment: [name] deficits/detail;RLE deficits/detail;Generalized weakness RLE Deficits / Details: Hip, knee, ankle ROM WNL for simple mobility tasks; Wound at lateral aspect of R foot near transmet amp with foam dressing; [name] reports increased pain in R foot over past few weeks; had opted not to don shoe with insert because of this pain [name] Deficits / Details: Transtibial  Amputation; Hip, knee ROM WFL for simple mobility tasks; Pain at anterior aspect of distal residual limb with weight bearing/standing in prosthesis; notable callus as well    Cervical / Trunk Assessment Cervical / Trunk Assessment: Normal  Communication   Communication: No difficulties  Cognition Arousal/Alertness: Awake/alert Behavior During Therapy: WFL for tasks assessed/performed(exhausted, feeling "wiped-out") Overall Cognitive Status: Within Functional Limits for tasks assessed                                        General Comments      Exercises     Assessment/Plan    PT Assessment Patient needs continued PT services  PT Problem List Decreased strength;Decreased activity tolerance;Decreased balance;Decreased mobility;Decreased knowledge of use of DME;Decreased knowledge of precautions;Decreased skin integrity;Impaired sensation;Pain       PT Treatment Interventions DME instruction;Gait training;Functional mobility training;Therapeutic activities;Therapeutic exercise;Balance training;Patient/family education;Wheelchair mobility training    PT Goals (Current goals can be found in the Care Plan section)  Acute Rehab PT Goals Patient Stated Goal: be able to walk; be able to return to work PT Goal Formulation: With patient Time For Goal Achievement: 09/24/18 Potential to Achieve Goals: Good    Frequency Min 3X/week   Barriers to discharge  Co-evaluation               AM-PAC PT "6 Clicks" Daily Activity  Outcome Measure Difficulty turning over in bed (including adjusting bedclothes, sheets and blankets)?: A Little Difficulty moving from lying on back to sitting on the side of the bed? : A Little Difficulty sitting down on and standing up from a chair with arms (e.g., wheelchair, bedside commode, etc,.)?: A Lot Help needed moving to and from a bed to chair (including a wheelchair)?: A Little Help needed walking in hospital room?: A  Lot Help needed climbing 3-5 steps with a railing? : Total 6 Click Score: 14    End of Session Equipment Utilized During Treatment: Other (comment)(pt's own belt in his pants) Activity Tolerance: Patient tolerated treatment well;Patient limited by fatigue Patient left: in bed;Other (comment)(Transport present to take him to Vascular) Nurse Communication: Mobility status;Other (comment)(plan to consult [name] Geologist, engineering) PT Visit Diagnosis: Unsteadiness on feet (R26.81);Other abnormalities of gait and mobility (R26.89);Muscle weakness (generalized) (M62.81);Pain Pain - Right/Left: (R foot, L Residual Limb) Pain - part of body: (R foot, L Residual Limb)    Time: 2244-9753 PT Time Calculation (min) (ACUTE ONLY): 27 min   Charges:   PT Evaluation $PT Eval Moderate Complexity: 1 Mod PT Treatments $Therapeutic Activity: 8-22 mins        Roney Marion, PT  Acute Rehabilitation Services Pager [phone] Office [place] 09/10/2018, 12:44 PM

## 2018-09-10 NOTE — Progress Notes (Signed)
OT Cancellation Note  Patient Details Name: [name] MRN: [id number] DOB: 1961-01-22   Cancelled Treatment:    Reason Eval/Treat Not Completed: Pt has had visitors all afternoon.  Attempted to see pt after ~2 hours, and he adamantly refused due to having visitors.  Will reattempt.  Lucille Passy, OTR/L Acute Rehabilitation Services Pager [phone] Office [phone]   Lucille Passy M 09/10/2018, 2:38 PM

## 2018-09-10 NOTE — Progress Notes (Addendum)
PROGRESS NOTE  [name] PPJ:[id number] DOB: 04-17-1961 DOA: 09/09/2018 PCP: System, Pcp Not In  HPI/Recap of past 24 hours: [name] is a 57 y.o. male with medical history significant of advanced CKD, left below-knee amputation, right transmetatarsal foot amputation, hypertension, diabetes, peripheral neuropathy, hyperlipidemia, tobacco abuse now in remission.  Presented with 1 week history of right foot pressure worse in the lateral aspect of her right foot and purulent drainage he noticed skin breakdown of the foot about 2 weeks ago. Patient reports he has decreased sensation in his lower extremities and unable to feel pain.  Cr greater than 6. Still makes urine Last time had seen nephrology was 6 m ago. Had Left AC fistula placed in October 2018.  09/10/2018: Patient seen and examined at his bedside.  Reports significant pain in his right stump.  Pain medication in place as needed.  Orthopedic surgery consulted.  ABI done which revealed moderate peripheral artery disease.  Vascular surgery consulted for possible angiogram and intervention.    Assessment/Plan: Active Problems:   Anemia of chronic disease   AKI (acute kidney injury) ([place])   Essential hypertension   DM type 2 causing CKD stage 4 (HCC)   Unilateral complete BKA, left, sequela (HCC)   Chronic kidney disease (CKD), stage IV (severe) (HCC)   Type 2 diabetes mellitus with peripheral neuropathy (HCC)   Hyperlipidemia   Diabetic foot ulcer (HCC)   Hypocalcemia   Dehydration  Right foot ulcer Positive ABI with moderate disease Positive for MRSA screening Elevated sed rate and CRP, high risk for osteomyelitis Rule out osteomyelitis, MRI right foot pending Vascular surgery consulted and planning on angiogram and possible intervention Continue IV antibiotics empirically on IV Rocephin and IV Flagyl Add IV vancomycin due to positive MRSA screening Blood cultures x2 in process Monitor fever curve and  WBC Obtain CBC in the morning  Moderate peripheral artery disease Continue aspirin and Lipitor Vascular surgery consulted Possible angiogram and intervention  Severe anion gap metabolic acidosis most likely secondary to renal failure versus others Bicarb 11 with anion gap of 14 Start isotonic bicarb Continue to monitor chemistry bicarb and anion gap Repeat BMP in the morning  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 Creatinine 9.09 GFR 6 Nephrology consulted and following Possible dialysis-defer to nephrology Has a working fistula in place  Severe hypomagnesemia Magnesium 1.1 Repleted with IV magnesium 4 g once Repeat magnesium level in the morning  Hyperphosphatemia Nephrology managing Possible hemodialysis  Hypertension Blood pressure stable Continue amlodipine, carvedilol  Risks: High risk for decompensation due to significant right foot ulcer with suspicion for osteomyelitis requiring IV antibiotics, significant peripheral artery disease requiring intervention, and severe electrolyte imbalance requiring IV replacement.   Code Status: Full code  Family Communication: None at bedside  Disposition Plan: Home when clinically stable possibly in 1 to 2 days or when nephrology, orthopedic surgery and vascular surgery sign off.   Consultants:  Vascular surgery  Orthopedic surgery  Nephrology  Procedures:  None  Antimicrobials:  Rocephin and IV Flagyl  DVT prophylaxis: Subcu heparin 3 times daily   Objective: Vitals:   09/10/18 0001 09/10/18 0605 09/10/18 0922 09/10/18 1223  BP: (!) 162/79 133/79 132/74   Pulse: 89 76 74   Resp: 20 20 18    Temp: 98.5 F (36.9 C) 98.3 F (36.8 C) 98.1 F (36.7 C)   TempSrc: Oral Oral Oral   SpO2: 100% 99% 99%   Weight:    99 kg  Height:  Intake/Output Summary (Last 24 hours) at 09/10/2018 1618 Last data filed at 09/10/2018 1400 Gross per 24 hour  Intake 772.56 ml  Output 600 ml  Net 172.56 ml   Filed Weights   09/09/18  1848 09/10/18 1223  Weight: 106.6 kg 99 kg    Exam:  . General: 57 y.o. year-old male well developed well nourished in no acute distress.  Alert and oriented x3. . Cardiovascular: Regular rate and rhythm with no rubs or gallops.  No thyromegaly or JVD noted.   Marland Kitchen Respiratory: Clear to auscultation with no wheezes or rales. Good inspiratory effort. . Abdomen: Soft nontender nondistended with normal bowel sounds x4 quadrants. . Psychiatry: Mood is appropriate for condition and setting   Data Reviewed: CBC: Recent Labs  Lab 09/09/18 1951 09/10/18 0034  WBC 14.8* 13.2*  NEUTROABS 12.3* 10.2*  HGB 8.8* 8.9*  HCT 27.7* 29.4*  MCV 72.1* 75.6*  PLT 295 539   Basic Metabolic Panel: Recent Labs  Lab 09/09/18 1951 09/10/18 0034  NA 140 139  K 3.9 3.9  CL 111 114*  CO2 11* 11*  GLUCOSE 160* 153*  BUN 118* 120*  CREATININE 9.37* 9.09*  CALCIUM 6.2* 6.5*  MG 1.2* 1.1*  PHOS 7.6* 7.6*   GFR: Estimated Creatinine Clearance: 10.7 mL/min (A) (by C-G formula based on SCr of 9.09 mg/dL (H)). Liver Function Tests: Recent Labs  Lab 09/09/18 1951 09/10/18 0034  AST 20 18  ALT 30 26  ALKPHOS 66 55  BILITOT 0.4 0.6  PROT 8.3* 7.7  ALBUMIN 3.2* 2.9*   No results for input(s): LIPASE, AMYLASE in the last 168 hours. No results for input(s): AMMONIA in the last 168 hours. Coagulation Profile: No results for input(s): INR, PROTIME in the last 168 hours. Cardiac Enzymes: No results for input(s): CKTOTAL, CKMB, CKMBINDEX, TROPONINI in the last 168 hours. BNP (last 3 results) No results for input(s): PROBNP in the last 8760 hours. HbA1C: Recent Labs    09/10/18 0034  HGBA1C 7.5*   CBG: Recent Labs  Lab 09/09/18 2358 09/10/18 0429 09/10/18 0749 09/10/18 1126 09/10/18 1605  GLUCAP 147* 134* 135* 150* 212*   Lipid Profile: No results for input(s): CHOL, HDL, LDLCALC, TRIG, CHOLHDL, LDLDIRECT in the last 72 hours. Thyroid Function Tests: Recent Labs    09/10/18 0034   TSH 3.274   Anemia Panel: Recent Labs    09/09/18 1951 09/10/18 0526  VITAMINB12 655  --   FOLATE 7.1  --   FERRITIN 841* 735*  TIBC 204* 165*  IRON 42* 39*  RETICCTPCT 1.8  --    Urine analysis:    Component Value Date/Time   COLORURINE YELLOW 03/30/2017 2140   APPEARANCEUR HAZY (A) 03/30/2017 2140   LABSPEC 1.011 03/30/2017 2140   PHURINE 5.0 03/30/2017 2140   GLUCOSEU 150 (A) 03/30/2017 2140   HGBUR MODERATE (A) 03/30/2017 2140   BILIRUBINUR NEGATIVE 03/30/2017 2140   KETONESUR NEGATIVE 03/30/2017 2140   PROTEINUR >=300 (A) 03/30/2017 2140   NITRITE NEGATIVE 03/30/2017 2140   LEUKOCYTESUR NEGATIVE 03/30/2017 2140   Sepsis Labs: @LABRCNTIP (procalcitonin:4,lacticidven:4)  ) Recent Results (from the past 240 hour(s))  MRSA PCR Screening     Status: Abnormal   Collection Time: 09/10/18  8:51 AM  Result Value Ref Range Status   MRSA by PCR POSITIVE (A) NEGATIVE Final    Comment:        The GeneXpert MRSA Assay (FDA approved for NASAL specimens only), is one component of a comprehensive MRSA colonization surveillance  program. It is not intended to diagnose MRSA infection nor to guide or monitor treatment for MRSA infections. RESULT CALLED TO, READ BACK BY AND VERIFIED WITH: RN Chapman Moss [phone] Performed at [facility] [address] [place], [place] [address]       Studies: Dg Foot Complete Right  Result Date: 09/09/2018 CLINICAL DATA:  Right foot pain for 1 week, history of partial amputation EXAM: RIGHT FOOT COMPLETE - 1 VIEW COMPARISON:  None. FINDINGS: There is been amputation of the first metatarsal and digit as well as transmetatarsal amputation through the second through fifth metatarsals. No definitive bony erosive changes are seen. Mild soft tissue swelling is noted. IMPRESSION: Mild soft tissue swelling. Changes consistent with prior amputation are noted. Electronically Signed   By: Inez Catalina M.D.   On: 09/09/2018 20:20     Scheduled Meds: . amLODipine  10 mg Oral QHS  . aspirin EC  81 mg Oral Daily  . atorvastatin  10 mg Oral QHS  . calcium acetate  1,334 mg Oral TID WC  . carvedilol  25 mg Oral BID WC  . Chlorhexidine Gluconate Cloth  6 each Topical Q0600  . feeding supplement (NEPRO CARB STEADY)  237 mL Oral BID BM  . gabapentin  300 mg Oral QHS  . insulin aspart  0-9 Units Subcutaneous Q4H  . insulin glargine  10 Units Subcutaneous BID  . multivitamin  1 tablet Oral QHS  . mupirocin ointment   Nasal BID  . sodium bicarbonate  650 mg Oral BID    Continuous Infusions: . cefTRIAXone (ROCEPHIN)  IV 2 g (09/10/18 0047)   And  . metronidazole 500 mg (09/10/18 0845)  .  sodium bicarbonate  infusion 1000 mL 75 mL/hr at 09/10/18 1423     LOS: 1 day     Kayleen Memos, MD [facility] Pager [phone]  If 7PM-7AM, please contact night-coverage [email or web address] Password [email or web address] 09/10/2018, 4:18 PM

## 2018-09-10 NOTE — Progress Notes (Signed)
Initial Nutrition Assessment  DOCUMENTATION CODES:   Not applicable  INTERVENTION:   Nepro Shake po BID, each supplement provides 425 kcal and 19 grams protein  Double portion of protein at all meals  Add Renal MVI daily  Continue dialysis diet education on follow-up  NUTRITION DIAGNOSIS:   Increased nutrient needs related to acute illness, wound healing(initiation of dialysis) as evidenced by estimated needs.  GOAL:   Patient will meet greater than or equal to 90% of their needs  MONITOR:   PO intake, Supplement acceptance, Labs, Weight trends, TF tolerance  REASON FOR ASSESSMENT:   Consult Wound healing  ASSESSMENT:   57 yo male admitted with right food pain with ulceration of lateral aspect of prior R. TMA. Pt also with advanced CKD with probably uremia with plans to initiate HD. PMH includes L. BKA with use of prosthesis, R. transmetatarsal foot amputation, CKD IV with fistula placed in October 2018 (not yet on HD), HTN, DM, HLD   +Nausea x 2 weeks with very poor appetite, likely related to uremia per MD notes. Plan to initiate HD today  On visit today, pt denies having nausea. Pt reports he has been experiencing heart burn that then causes him to feel sick on his stomach. Pt denies vomiting. Pt does report feeling very weak/fatigue. Pt describes it has feeling "wiped out." Pt also denies poor appetite on visit today. Pt reports he has been eating 2-3 good meals with snacks.   Pt reports UBW around 235 pounds; pt believes he has lost 10 pouns but over unknown time frame. Current wt 217.8 pounds.   Initiated dialysis diet/nutrition education with pt; reinforced importance of adequate protein intake with regards to wound healing and dialysis. Discussed sources of protein and plan to order pt double protein at meal times. Also reviewed current phosphorus level with pt, goal phosphorus level and the initiation of binder therapy by MD. Bryan Lemma how phosphorus binders work.   Discussed importance of taking PhosLo with all meals/snacks. Recommend initiation of renal MVI. Discussed this with pt as well.   Labs: BUN120, Creatinine 9.09, phosphorus 7.6 (H), magnesium 1.1 (L), potassium 3.9 (wdl), CRP 4.7 (H), Vitamin B12 wdl, CBGs 134-150 Meds: PhosLo TID with meals, ss novolog, Lantus BID, sodium bicarb tablet  NUTRITION - FOCUSED PHYSICAL EXAM:    Most Recent Value  Orbital Region  No depletion  Upper Arm Region  No depletion  Thoracic and Lumbar Region  No depletion  Buccal Region  No depletion  Temple Region  No depletion  Clavicle Bone Region  Mild depletion  Clavicle and Acromion Bone Region  No depletion  Scapular Bone Region  Mild depletion  Dorsal Hand  No depletion  Patellar Region  Unable to assess  Anterior Thigh Region  Unable to assess  Posterior Calf Region  Unable to assess  Edema (RD Assessment)  None       Diet Order:   Diet Order            Diet NPO time specified  Diet effective midnight        Diet renal/carb modified with fluid restriction Diet-HS Snack? Nothing; Fluid restriction: 1200 mL Fluid; Room service appropriate? Yes; Fluid consistency: Thin  Diet effective now              EDUCATION NEEDS:   Education needs have been addressed  Skin:  Skin Assessment: Skin Integrity Issues: Skin Integrity Issues:: Other (Comment) Other: ulceration of latera aspect of prior Right TMA  Last  BM:  no documented BM  Height:   Ht Readings from Last 1 Encounters:  09/09/18 6\' 4"  (1.93 m)    Weight:   Wt Readings from Last 1 Encounters:  09/10/18 99 kg    Ideal Body Weight:  84.5 kg (adjusted for amputations)  BMI:  Body mass index is 26.57 kg/m.  Estimated Nutritional Needs:   Kcal:  2400-2600 kcals   Protein:  120-130 g  Fluid:  1000 mL plus UOP   [employer] MS, RD, LDN, CNSC [phone] Pager  [phone] Weekend/On-Call Pager

## 2018-09-11 ENCOUNTER — Inpatient Hospital Stay (HOSPITAL_COMMUNITY): Payer: Medicare Other

## 2018-09-11 LAB — BASIC METABOLIC PANEL
Anion gap: 13 (ref 5–15)
BUN: 81 mg/dL — AB (ref 6–20)
CHLORIDE: 105 mmol/L (ref 98–111)
CO2: 21 mmol/L — ABNORMAL LOW (ref 22–32)
CREATININE: 6.88 mg/dL — AB (ref 0.61–1.24)
Calcium: 6.3 mg/dL — CL (ref 8.9–10.3)
GFR calc Af Amer: 9 mL/min — ABNORMAL LOW (ref >60)
GFR calc non Af Amer: 8 mL/min — ABNORMAL LOW (ref >60)
Glucose, Bld: 144 mg/dL — ABNORMAL HIGH (ref 70–99)
Potassium: 2.9 mmol/L — ABNORMAL LOW (ref 3.5–5.1)
SODIUM: 139 mmol/L (ref 135–145)

## 2018-09-11 LAB — GLUCOSE, CAPILLARY
GLUCOSE-CAPILLARY: 112 mg/dL — AB (ref 70–99)
GLUCOSE-CAPILLARY: 138 mg/dL — AB (ref 70–99)
GLUCOSE-CAPILLARY: 144 mg/dL — AB (ref 70–99)
GLUCOSE-CAPILLARY: 270 mg/dL — AB (ref 70–99)
Glucose-Capillary: 179 mg/dL — ABNORMAL HIGH (ref 70–99)

## 2018-09-11 LAB — MAGNESIUM: MAGNESIUM: 1.4 mg/dL — AB (ref 1.7–2.4)

## 2018-09-11 LAB — PHOSPHORUS: PHOSPHORUS: 5.5 mg/dL — AB (ref 2.5–4.6)

## 2018-09-11 LAB — CBC
HEMATOCRIT: 26.1 % — AB (ref 39.0–52.0)
Hemoglobin: 8.1 g/dL — ABNORMAL LOW (ref 13.0–17.0)
MCH: 22.8 pg — ABNORMAL LOW (ref 26.0–34.0)
MCHC: 31 g/dL (ref 30.0–36.0)
MCV: 73.3 fL — AB (ref 80.0–100.0)
PLATELETS: 235 10*3/uL (ref 150–400)
RBC: 3.56 MIL/uL — ABNORMAL LOW (ref 4.22–5.81)
RDW: 15.4 % (ref 11.5–15.5)
WBC: 10.2 10*3/uL (ref 4.0–10.5)
nRBC: 0 % (ref 0.0–0.2)

## 2018-09-11 LAB — PARATHYROID HORMONE, INTACT (NO CA): PTH: 275 pg/mL — ABNORMAL HIGH (ref 15–65)

## 2018-09-11 LAB — VITAMIN D 25 HYDROXY (VIT D DEFICIENCY, FRACTURES): VIT D 25 HYDROXY: 16.3 ng/mL — AB (ref 30.0–100.0)

## 2018-09-11 LAB — HEPATITIS B SURFACE ANTIGEN: Hepatitis B Surface Ag: NEGATIVE

## 2018-09-11 MED ORDER — FAMOTIDINE IN NACL 20-0.9 MG/50ML-% IV SOLN
20.0000 mg | Freq: Once | INTRAVENOUS | Status: DC
  Filled 2018-09-11: qty 50

## 2018-09-11 MED ORDER — POTASSIUM CHLORIDE 10 MEQ/100ML IV SOLN
10.0000 meq | INTRAVENOUS | Status: AC
  Filled 2018-09-11 (×2): qty 100

## 2018-09-11 MED ORDER — POTASSIUM CHLORIDE CRYS ER 20 MEQ PO TBCR
40.0000 meq | EXTENDED_RELEASE_TABLET | Freq: Once | ORAL | Status: AC
  Administered 2018-09-11: 40 meq via ORAL
  Filled 2018-09-11: qty 2

## 2018-09-11 MED ORDER — INFLUENZA VAC SPLIT QUAD 0.5 ML IM SUSY
0.5000 mL | PREFILLED_SYRINGE | Freq: Once | INTRAMUSCULAR | Status: AC
  Administered 2018-09-11: 0.5 mL via INTRAMUSCULAR
  Filled 2018-09-11: qty 0.5

## 2018-09-11 MED ORDER — MAGNESIUM SULFATE 4 GM/100ML IV SOLN
4.0000 g | Freq: Once | INTRAVENOUS | Status: AC
  Administered 2018-09-11: 4 g via INTRAVENOUS
  Filled 2018-09-11: qty 100

## 2018-09-11 MED ORDER — PANTOPRAZOLE SODIUM 40 MG PO TBEC
40.0000 mg | DELAYED_RELEASE_TABLET | Freq: Every day | ORAL | Status: DC
  Administered 2018-09-11 – 2018-09-20 (×9): 40 mg via ORAL
  Filled 2018-09-11 (×9): qty 1

## 2018-09-11 MED ORDER — DARBEPOETIN ALFA 60 MCG/0.3ML IJ SOSY: 60.0000 ug | PREFILLED_SYRINGE | INTRAMUSCULAR | Status: DC

## 2018-09-11 MED ORDER — POTASSIUM CHLORIDE 10 MEQ/100ML IV SOLN
10.0000 meq | INTRAVENOUS | Status: DC
  Administered 2018-09-11: 10 meq via INTRAVENOUS
  Filled 2018-09-11 (×3): qty 100

## 2018-09-11 MED ORDER — FAMOTIDINE IN NACL 20-0.9 MG/50ML-% IV SOLN: 20.0000 mg | Freq: Two times a day (BID) | INTRAVENOUS | Status: DC

## 2018-09-11 NOTE — Progress Notes (Signed)
PROGRESS NOTE    [name]  IDP:[id number] DOB: 1961-11-30 DOA: 09/09/2018 PCP: System, Pcp Not In    Brief Narrative:  57 y.o.malewith medical history significant of advanced CKD, left below-knee amputation, right transmetatarsal foot amputation, hypertension, diabetes, peripheral neuropathy, hyperlipidemia, tobacco abusenow in remission.  Presented with1 week history of right foot pressure worse in the lateral aspect of her right foot and purulent drainage he noticed skin breakdown of the foot about 2 weeks ago. Patient reports he has decreased sensation in his lower extremities and unable to feel pain.  Cr greater than 6. Still makes urine Last time had seen nephrology was 6 m ago. Had Left AC fistula placed in October 2018.  09/10/2018: Patient seen and examined at his bedside.  Reports significant pain in his right stump.  Pain medication in place as needed.  Orthopedic surgery consulted.  ABI done which revealed moderate peripheral artery disease.  Vascular surgery consulted for possible angiogram and intervention.  Assessment & Plan:   Active Problems:   Anemia of chronic disease   AKI (acute kidney injury) ([place])   Essential hypertension   DM type 2 causing CKD stage 4 (HCC)   Unilateral complete BKA, left, sequela (HCC)   Chronic kidney disease (CKD), stage IV (severe) (HCC)   Type 2 diabetes mellitus with peripheral neuropathy (HCC)   Hyperlipidemia   Diabetic foot ulcer (HCC)   Hypocalcemia   Dehydration  Right foot ulcer Positive ABI with moderate disease Positive for MRSA screening Elevated sed rate and CRP noted R foot MRI done, pending results Vascular surgery consulted, plans for fistulogram and aortogram RLE tomorrow Continued on empiric on IV Rocephin and IV Flagyl Add IV vancomycin due to positive MRSA screening Blood cultures negative thus far  Moderate peripheral artery disease Continue aspirin and Lipitor Vascular surgery following per  above Aortogram and fistulogram as per above  Severe anion gap metabolic acidosis most likely secondary to renal failure versus others Bicarb 11 with anion gap of 14  Recheck bmet in AM  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 Creatinine down to 6.88 Nephrology consulted and following Nephrology following Has a working fistula in place, fistulogram as per above planned  Severe hypomagnesemia Given IV magnesium 4 g yesterday with repeat Mg of 1.4 Repeat lytes in AM  Hyperphosphatemia Repeat lytes in AM Stable at present  Hypertension Blood pressure stable and controlled Continue amlodipine, carvedilol as tolerated  DVT prophylaxis: Heparin subq Code Status: Full Family Communication: Pt in room, family not at bedside Disposition Plan: Uncertain at this time  Consultants:   Vascular surgery  Orthopedic surgery  Nephrology  Procedures:   Rocephin and IV Flagyl  Antimicrobials: Anti-infectives (From admission, onward)   Start     Dose/Rate Route Frequency Ordered Stop   09/10/18 0030  cefTRIAXone (ROCEPHIN) 2 g in sodium chloride 0.9 % 100 mL IVPB     2 g 200 mL/hr over 30 Minutes Intravenous Daily at bedtime 09/09/18 2344     09/10/18 0000  metroNIDAZOLE (FLAGYL) IVPB 500 mg     500 mg 100 mL/hr over 60 Minutes Intravenous Every 8 hours 09/09/18 2344     09/09/18 2115  piperacillin-tazobactam (ZOSYN) IVPB 3.375 g  Status:  Discontinued     3.375 g 100 mL/hr over 30 Minutes Intravenous  Once 09/09/18 2100 09/09/18 2108   09/09/18 2115  piperacillin-tazobactam (ZOSYN) IVPB 2.25 g     2.25 g 100 mL/hr over 30 Minutes Intravenous  Once 09/09/18 2108 09/09/18 2207  09/09/18 2115  vancomycin (VANCOCIN) 2,000 mg in sodium chloride 0.9 % 500 mL IVPB     2,000 mg 250 mL/hr over 120 Minutes Intravenous  Once 09/09/18 2109 09/09/18 2334       Subjective: No complaints at this time  Objective: Vitals:   09/10/18 2206 09/10/18 2237 09/11/18 0458 09/11/18 0855  BP: (!) 141/71  135/73 119/75 127/73  Pulse: 72 76 70 67  Resp: 12 16 16 18   Temp: 98.6 F (37 C) 98.1 F (36.7 C) 98.1 F (36.7 C) 97.8 F (36.6 C)  TempSrc: Oral Oral Oral Oral  SpO2: 100% 100% 99% 96%  Weight: 99.3 kg     Height:        Intake/Output Summary (Last 24 hours) at 09/11/2018 1436 Last data filed at 09/11/2018 0900 Gross per 24 hour  Intake 554.67 ml  Output 0 ml  Net 554.67 ml   Filed Weights   09/10/18 1223 09/10/18 1848 09/10/18 2206  Weight: 99 kg 99.3 kg 99.3 kg    Examination:  General exam: Appears calm and comfortable  Respiratory system: Clear to auscultation. Respiratory effort normal. Cardiovascular system: S1 & S2 heard, RRR Gastrointestinal system: Abdomen is nondistended, soft and nontender. No organomegaly or masses felt. Normal bowel sounds heard. Central nervous system: Alert and oriented. No focal neurological deficits. Extremities: Symmetric 5 x 5 power. Skin: No rashes, lesions Psychiatry: Judgement and insight appear normal  Data Reviewed: I have personally reviewed following labs and imaging studies  CBC: Recent Labs  Lab 09/09/18 1951 09/10/18 0034 09/11/18 0455  WBC 14.8* 13.2* 10.2  NEUTROABS 12.3* 10.2*  --   HGB 8.8* 8.9* 8.1*  HCT 27.7* 29.4* 26.1*  MCV 72.1* 75.6* 73.3*  PLT 295 281 703   Basic Metabolic Panel: Recent Labs  Lab 09/09/18 1951 09/10/18 0034 09/11/18 0455  NA 140 139 139  K 3.9 3.9 2.9*  CL 111 114* 105  CO2 11* 11* 21*  GLUCOSE 160* 153* 144*  BUN 118* 120* 81*  CREATININE 9.37* 9.09* 6.88*  CALCIUM 6.2* 6.5* 6.3*  MG 1.2* 1.1* 1.4*  PHOS 7.6* 7.6* 5.5*   GFR: Estimated Creatinine Clearance: 14.2 mL/min (A) (by C-G formula based on SCr of 6.88 mg/dL (H)). Liver Function Tests: Recent Labs  Lab 09/09/18 1951 09/10/18 0034 09/10/18 1930  AST 20 18  --   ALT 30 26 24   ALKPHOS 66 55  --   BILITOT 0.4 0.6  --   PROT 8.3* 7.7  --   ALBUMIN 3.2* 2.9*  --    No results for input(s): LIPASE, AMYLASE in  the last 168 hours. No results for input(s): AMMONIA in the last 168 hours. Coagulation Profile: No results for input(s): INR, PROTIME in the last 168 hours. Cardiac Enzymes: No results for input(s): CKTOTAL, CKMB, CKMBINDEX, TROPONINI in the last 168 hours. BNP (last 3 results) No results for input(s): PROBNP in the last 8760 hours. HbA1C: Recent Labs    09/10/18 0034  HGBA1C 7.5*   CBG: Recent Labs  Lab 09/10/18 2245 09/10/18 2356 09/11/18 0348 09/11/18 0857 09/11/18 1144  GLUCAP 165* 196* 179* 112* 138*   Lipid Profile: No results for input(s): CHOL, HDL, LDLCALC, TRIG, CHOLHDL, LDLDIRECT in the last 72 hours. Thyroid Function Tests: Recent Labs    09/10/18 0034  TSH 3.274   Anemia Panel: Recent Labs    09/09/18 1951 09/10/18 0526  VITAMINB12 655  --   FOLATE 7.1  --   FERRITIN 841* 735*  TIBC 204* 165*  IRON 42* 39*  RETICCTPCT 1.8  --    Sepsis Labs: Recent Labs  Lab 09/09/18 2000  LATICACIDVEN 0.56    Recent Results (from the past 240 hour(s))  Blood Cultures x 2 sites     Status: None (Preliminary result)   Collection Time: 09/10/18 12:30 AM  Result Value Ref Range Status   Specimen Description BLOOD LEFT ARM  Final   Special Requests   Final    BOTTLES DRAWN AEROBIC AND ANAEROBIC Blood Culture adequate volume   Culture   Final    NO GROWTH 1 DAY Performed at [facility], [place] [address] [place], [place] [address]    Report Status PENDING  Incomplete  Blood Cultures x 2 sites     Status: None (Preliminary result)   Collection Time: 09/10/18 12:34 AM  Result Value Ref Range Status   Specimen Description BLOOD LEFT HAND  Final   Special Requests   Final    BOTTLES DRAWN AEROBIC ONLY Blood Culture adequate volume   Culture   Final    NO GROWTH 1 DAY Performed at [facility], [place] [address], [place], [place] [address]    Report Status PENDING  Incomplete  MRSA PCR Screening     Status: Abnormal   Collection Time: 09/10/18   8:51 AM  Result Value Ref Range Status   MRSA by PCR POSITIVE (A) NEGATIVE Final    Comment:        The GeneXpert MRSA Assay (FDA approved for NASAL specimens only), is one component of a comprehensive MRSA colonization surveillance program. It is not intended to diagnose MRSA infection nor to guide or monitor treatment for MRSA infections. RESULT CALLED TO, READ BACK BY AND VERIFIED WITH: RN Chapman Moss [id number] Performed at [facility] [address] [place], [place] [address]      Radiology Studies: Dg Foot Complete Right  Result Date: 09/09/2018 CLINICAL DATA:  Right foot pain for 1 week, history of partial amputation EXAM: RIGHT FOOT COMPLETE - 1 VIEW COMPARISON:  None. FINDINGS: There is been amputation of the first metatarsal and digit as well as transmetatarsal amputation through the second through fifth metatarsals. No definitive bony erosive changes are seen. Mild soft tissue swelling is noted. IMPRESSION: Mild soft tissue swelling. Changes consistent with prior amputation are noted. Electronically Signed   By: Inez Catalina M.D.   On: 09/09/2018 20:20    Scheduled Meds: . amLODipine  10 mg Oral QHS  . aspirin EC  81 mg Oral Daily  . atorvastatin  10 mg Oral QHS  . carvedilol  25 mg Oral BID WC  . Chlorhexidine Gluconate Cloth  6 each Topical Q0600  . [START ON 09/12/2018] darbepoetin (ARANESP) injection - DIALYSIS  60 mcg Intravenous Q Thu-HD  . feeding supplement (NEPRO CARB STEADY)  237 mL Oral BID BM  . gabapentin  300 mg Oral QHS  . Influenza vac split quadrivalent PF  0.5 mL Intramuscular Once  . insulin aspart  0-9 Units Subcutaneous Q4H  . insulin glargine  10 Units Subcutaneous BID  . multivitamin  1 tablet Oral QHS  . mupirocin ointment   Nasal BID  . pantoprazole  40 mg Oral Daily   Continuous Infusions: . sodium chloride    . sodium chloride    . cefTRIAXone (ROCEPHIN)  IV 2 g (09/11/18 0116)   And  . metronidazole 500 mg (09/11/18 1132)   . famotidine (  PEPCID) IV    . magnesium sulfate 1 - 4 g bolus IVPB 4 g (09/11/18 1313)     LOS: 2 days   Marylu Lund, MD [facility] Pager On Amion  If 7PM-7AM, please contact night-coverage 09/11/2018, 2:36 PM

## 2018-09-11 NOTE — Progress Notes (Signed)
Admit: 09/09/2018 LOS: 2  88M new ESRD, R Foot ulcer  Subjective:  . HD#1 yesterday,  No UF.  Marland Kitchen For angiogram today with [facility] . CLIP in process . Discussed logistics of iHD, HHD  10/08 0701 - 10/09 0700 In: 254.7 [P.O.:120; I.V.:77.6; IV Piggyback:57.1] Out: 600 [Urine:600]  Filed Weights   09/10/18 1223 09/10/18 1848 09/10/18 2206  Weight: 99 kg 99.3 kg 99.3 kg    Scheduled Meds: . amLODipine  10 mg Oral QHS  . aspirin EC  81 mg Oral Daily  . atorvastatin  10 mg Oral QHS  . calcium acetate  1,334 mg Oral TID WC  . carvedilol  25 mg Oral BID WC  . Chlorhexidine Gluconate Cloth  6 each Topical Q0600  . feeding supplement (NEPRO CARB STEADY)  237 mL Oral BID BM  . gabapentin  300 mg Oral QHS  . Influenza vac split quadrivalent PF  0.5 mL Intramuscular Once  . insulin aspart  0-9 Units Subcutaneous Q4H  . insulin glargine  10 Units Subcutaneous BID  . multivitamin  1 tablet Oral QHS  . mupirocin ointment   Nasal BID  . sodium bicarbonate  650 mg Oral BID   Continuous Infusions: . sodium chloride    . sodium chloride    . cefTRIAXone (ROCEPHIN)  IV 2 g (09/11/18 0116)   And  . metronidazole 500 mg (09/11/18 1132)  . famotidine (PEPCID) IV    . magnesium sulfate 1 - 4 g bolus IVPB    . potassium chloride 10 mEq (09/11/18 1118)  .  sodium bicarbonate  infusion 1000 mL 75 mL/hr at 09/11/18 0915   PRN Meds:.sodium chloride, sodium chloride, acetaminophen **OR** acetaminophen, alteplase, heparin, HYDROcodone-acetaminophen, lidocaine (PF), lidocaine-prilocaine, ondansetron **OR** ondansetron (ZOFRAN) IV, oxyCODONE, pentafluoroprop-tetrafluoroeth, polyethylene glycol  Current Labs: reviewed    Ref. Range 09/10/2018 05:26  Saturation Ratios Latest Ref Range: 17.9 - 39.5 % 24  Ferritin Latest Ref Range: 24 - 336 ng/mL 735 (H)    Ref. Range 09/10/2018 05:26  PTH, Intact Latest Ref Range: 15 - 65 pg/mL 275 (H)    Physical Exam:  Blood pressure 127/73, pulse 67, temperature  97.8 F (36.6 C), temperature source Oral, resp. rate 18, height 6\' 4"  (1.93 m), weight 99.3 kg, SpO2 96 %. NAD LUE AVF +B/T, deep R Foto ulcer bandaged RRR nl s1s2 CTAB diminished in bases No rashes/lesions S/nt/nd AAO x3, no asterixus L BKA, R TMA present  A 1. New ESRD 1. LUE AVF usable 2. CLIP in process 3. HD#1 started 09/10/18 2. R TMA Foot ulcer, VVS/Ortho following; angiogram planned 3. CKD BMD: PTH acceptable currently. P 5.5 hold binders, follow 4. Anemia; Fe indices above.  Needs ESA, start Aranesp 10/10 5. DM2 6. HTN 7. ASCVD/PAD  P . Start ESA with HD . Hold binder, follow labs . Stop NaHCO3 gtt and pills . HD tomorrow: 3K, 3h, Qb 250, 17g x2, No heparin, 1L UF . Hold binders, will address as outpatient . Medication Issues; o Preferred narcotic agents for pain control are hydromorphone, fentanyl, and methadone. Morphine should not be used.  o Baclofen should be avoided o Avoid oral sodium phosphate and magnesium citrate based laxatives / bowel preps    Pearson Grippe MD 09/11/2018, 11:39 AM  Recent Labs  Lab 09/09/18 1951 09/10/18 0034 09/11/18 0455  NA 140 139 139  K 3.9 3.9 2.9*  CL 111 114* 105  CO2 11* 11* 21*  GLUCOSE 160* 153* 144*  BUN 118* 120*  81*  CREATININE 9.37* 9.09* 6.88*  CALCIUM 6.2* 6.5* 6.3*  PHOS 7.6* 7.6* 5.5*   Recent Labs  Lab 09/09/18 1951 09/10/18 0034 09/11/18 0455  WBC 14.8* 13.2* 10.2  NEUTROABS 12.3* 10.2*  --   HGB 8.8* 8.9* 8.1*  HCT 27.7* 29.4* 26.1*  MCV 72.1* 75.6* 73.3*  PLT 295 281 235

## 2018-09-11 NOTE — Progress Notes (Signed)
Physical Therapy Treatment Patient Details Name: [name] MRN: [id number] DOB: [birth year] Today's Date: 09/11/2018    History of Present Illness [name] is a 57 y.o. male with medical history significant of CKD, left below-knee amputation, right transmetatarsal foot amputation, hypertension, diabetes, peripheral neuropathy, hyperlipidemia, admitted with worsening fatigue, weakness, lightheadedness and nausea and vomiting -- with admitting diagnosis of Renal failure, and now starting HD; Also in the past 2 weeks, pt reports an ulcer on R foot, lateral aspect near transmet amputation, as well as L prosthesis more painful, leading to less walking, and functional decline on top of Renal Failure.    PT Comments    Continuing work on functional mobility and activity tolerance;  Fatigue and decr standing tolerance persists; Today we worked without the prosthesis (because of residual limb pain in standing with it); Mod assist to stand to the RW in R single limb stance (wound is on lateral edge of foot and does not get any weight bearing); exhausted after standing trial, and +N/V; Nursing aware;   [name] did tell me a simple goal of his is to be able to use the RW and get to bathroom on R foot (without prosthesis); will work on mobility and amb on R LE with RW, and hold off on prosthesis until we get the fit evaluated by a prosthetist (asked [name] for a Prosthetist  Consult)  Follow Up Recommendations  Home health PT;Outpatient PT;Other (comment)(Depends on progress and prosthesis/shoe insert fit)     Equipment Recommendations  Other (comment)(Consider wheelchair if he doesn't already have one)    Recommendations for Other Services (Evaluation of Prosthesis and R shoe insert by Prosthetist)     Precautions / Restrictions Precautions Precautions: Fall Precaution Comments: Be mindful of pressure on R lateral aspect of foot, and of prosthesis fit/pain Required Braces or  Orthoses: Other Brace/Splint Other Brace/Splint: Shoe insert/orthosis R foot; Prosthesis L LE(pt will ask wife to bring in shoe insert) Restrictions Other Position/Activity Restrictions: Be mindful of pressure on R lateral aspect of foot, and of prosthesis fit/pain    Mobility  Bed Mobility Overal bed mobility: Needs Assistance Bed Mobility: Supine to Sit     Supine to sit: Min guard(without physical contact)     General bed mobility comments: Min handheld assist to pull to sit  Transfers Overall transfer level: Needs assistance Equipment used: Rolling walker (2 wheeled) Transfers: Sit to/from Stand Sit to Stand: Mod assist         General transfer comment: Light mod assist to steady; opted to go without L prosthesis, so R foot single limb stance to RW; started standing BP due to reports of dizziness, but had to sit before obtained BP reading (111/74)  Ambulation/Gait                 Stairs             Wheelchair Mobility    Modified Rankin (Stroke Patients Only)       Balance     Sitting balance-Leahy Scale: Good       Standing balance-Leahy Scale: Poor                              Cognition Arousal/Alertness: Awake/alert Behavior During Therapy: WFL for tasks assessed/performed(exhausted, feeling "wiped-out") Overall Cognitive Status: Within Functional Limits for tasks assessed  Exercises      General Comments General comments (skin integrity, edema, etc.): Persistent dizziness with upright activity; Attempted BP standing due to dizziness with sitting /stnding; unable to obtain BP while still standing because he had to sit; 111/84, HR 71 seated, after standing trial; Exhausted after standing trial, nauseated, vomitted; RN aware      Pertinent Vitals/Pain Pain Assessment: Faces Faces Pain Scale: Hurts a little bit Pain Location: Mostly nausea today, worked without  prosthesis Pain Descriptors / Indicators: Aching;Grimacing;Guarding;Discomfort;Pressure Pain Intervention(s): Monitored during session    Home Living                      Prior Function            PT Goals (current goals can now be found in the care plan section) Acute Rehab PT Goals Patient Stated Goal: be able to walk; be able to return to work PT Goal Formulation: With patient Time For Goal Achievement: 09/24/18 Potential to Achieve Goals: Good Progress towards PT goals: Progressing toward goals(Slowly)    Frequency    Min 3X/week      PT Plan Current plan remains appropriate    Co-evaluation              AM-PAC PT "6 Clicks" Daily Activity  Outcome Measure  Difficulty turning over in bed (including adjusting bedclothes, sheets and blankets)?: A Little Difficulty moving from lying on back to sitting on the side of the bed? : A Little Difficulty sitting down on and standing up from a chair with arms (e.g., wheelchair, bedside commode, etc,.)?: A Lot Help needed moving to and from a bed to chair (including a wheelchair)?: A Little Help needed walking in hospital room?: A Lot Help needed climbing 3-5 steps with a railing? : Total 6 Click Score: 14    End of Session Equipment Utilized During Treatment: Gait belt Activity Tolerance: Patient tolerated treatment well;Patient limited by fatigue Patient left: in bed;with call bell/phone within reach Nurse Communication: Mobility status;Other (comment)(N/V) PT Visit Diagnosis: Unsteadiness on feet (R26.81);Other abnormalities of gait and mobility (R26.89);Muscle weakness (generalized) (M62.81);Pain Pain - Right/Left: (R foot, L Residual Limb) Pain - part of body: (R foot, L Residual Limb)     Time: 8841-6606 PT Time Calculation (min) (ACUTE ONLY): 28 min  Charges:  $Therapeutic Activity: 23-37 mins                     [name], PT  Acute Rehabilitation Services Pager [phone] Office  [place] 09/11/2018, 3:45 PM

## 2018-09-11 NOTE — Evaluation (Signed)
Occupational Therapy Evaluation Patient Details Name: [name] MRN: [id number] DOB: 03-Feb-1961 Today's Date: 09/11/2018    History of Present Illness Gearald A Gillson is a 57 y.o. male with medical history significant of CKD, left below-knee amputation, right transmetatarsal foot amputation, hypertension, diabetes, peripheral neuropathy, hyperlipidemia, admitted with worsening fatigue, weakness, lightheadedness and nausea and vomiting -- with admitting diagnosis of Renal failure, and now starting HD; Also in the past 2 weeks, pt reports an ulcer on R foot, lateral aspect near transmet amputation, as well as L prosthesis more painful, leading to less walking, and functional decline on top of Renal Failure.   Clinical Impression   Pt was independent, using a prosthesis and shoe insert during ambulation. He presents with lethargy and fatigue, may be medication related. He requires set up to moderate assistance for ADL and stood momentarily with RW and moderate assistance without prosthesis as it is ill fitting (awaiting prosthetic consult) and shoe insert has not been brought to the hospital. Will follow acutely. Pt may need a w/c depending on fit of prosthesis/pain.    Follow Up Recommendations  Home health OT;Outpatient OT    Equipment Recommendations  Wheelchair (measurements OT);Wheelchair cushion (measurements OT);Tub/shower bench(if pt does not already have)    Recommendations for Other Services       Precautions / Restrictions Precautions Precautions: Fall Precaution Comments: Be mindful of pressure on R lateral aspect of foot, and of prosthesis fit/pain Required Braces or Orthoses: Other Brace/Splint Other Brace/Splint: Shoe insert/orthosis R foot; Prosthesis L LE(pt to ask wife to bring in shoe insert) Restrictions Weight Bearing Restrictions: No Other Position/Activity Restrictions: ill fitting prosthesis, has not seen [employer] yet      Mobility Bed Mobility Overal bed  mobility: Needs Assistance Bed Mobility: Supine to Sit;Sit to Supine     Supine to sit: Min guard Sit to supine: Min guard   General bed mobility comments: increased time, no physical assist with pt using rail  Transfers Overall transfer level: Needs assistance Equipment used: Rolling walker (2 wheeled) Transfers: Sit to/from Stand Sit to Stand: Mod assist         General transfer comment: stood x 1 for assessment without prosthesis, moderate assist to rise and steady    Balance Overall balance assessment: Needs assistance   Sitting balance-Leahy Scale: Good       Standing balance-Leahy Scale: Poor Standing balance comment: unable to release walker in standing                           ADL either performed or assessed with clinical judgement   ADL Overall ADL's : Needs assistance/impaired Eating/Feeding: Sitting;Set up   Grooming: Wash/dry hands;Wash/dry face;Oral care;Sitting;Set up   Upper Body Bathing: Set up;Sitting   Lower Body Bathing: Moderate assistance;Sitting/lateral leans   Upper Body Dressing : Set up;Sitting   Lower Body Dressing: Moderate assistance;Sitting/lateral leans                 General ADL Comments: Educated in method of leaning side to side as pt unable to release walker in static standing.     Vision Patient Visual Report: No change from baseline       Perception     Praxis      Pertinent Vitals/Pain Pain Assessment: No/denies pain Faces Pain Scale: Hurts a little bit Pain Location: Mostly nausea today, worked without prosthesis Pain Descriptors / Indicators: Aching;Grimacing;Guarding;Discomfort;Pressure Pain Intervention(s): Monitored during session  Hand Dominance Right   Extremity/Trunk Assessment Upper Extremity Assessment Upper Extremity Assessment: Overall WFL for tasks assessed   Lower Extremity Assessment Lower Extremity Assessment: Defer to PT evaluation   Cervical / Trunk  Assessment Cervical / Trunk Assessment: Normal   Communication Communication Communication: No difficulties   Cognition Arousal/Alertness: Lethargic Behavior During Therapy: Flat affect Overall Cognitive Status: Within Functional Limits for tasks assessed                                     General Comments      Exercises     Shoulder Instructions      Home Living Family/patient expects to be discharged to:: Private residence Living Arrangements: Spouse/significant other Available Help at Discharge: Family;Available 24 hours/day(cannot provide physical assist) Type of Home: House Home Access: Level entry     Home Layout: One level     Bathroom Shower/Tub: Teacher, early years/pre: Standard     Home Equipment: Cane - quad;Cane - single point;Walker - 2 wheels;Wheelchair - manual          Prior Functioning/Environment Level of Independence: Independent        Comments: Completely independent, driving, 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        OT Problem List: Decreased strength;Decreased activity tolerance;Impaired balance (sitting and/or standing);Decreased knowledge of use of DME or AE      OT Treatment/Interventions: Self-care/ADL training;DME and/or AE instruction;Therapeutic activities;Patient/family education;Balance training    OT Goals(Current goals can be found in the care plan section) Acute Rehab OT Goals Patient Stated Goal: be able to walk; be able to return to work OT Goal Formulation: With patient Time For Goal Achievement: 09/25/18 Potential to Achieve Goals: Good ADL Goals Pt Will Perform Lower Body Bathing: with modified independence;sitting/lateral leans Pt Will Perform Lower Body Dressing: with modified independence;sitting/lateral leans Pt Will Transfer to Toilet: with min guard assist;stand pivot transfer;bedside commode Pt Will Perform Toileting - Clothing Manipulation and  hygiene: with modified independence;sit to/from stand Pt Will Perform Tub/Shower Transfer: Tub transfer;with min guard assist;ambulating;tub bench;rolling walker  OT Frequency: Min 2X/week   Barriers to D/C:            Co-evaluation              AM-PAC PT "6 Clicks" Daily Activity     Outcome Measure Help from another person eating meals?: None Help from another person taking care of personal grooming?: A Little Help from another person toileting, which includes using toliet, bedpan, or urinal?: A Lot Help from another person bathing (including washing, rinsing, drying)?: A Lot Help from another person to put on and taking off regular upper body clothing?: None Help from another person to put on and taking off regular lower body clothing?: A Lot 6 Click Score: 17   End of Session Equipment Utilized During Treatment: Gait belt;Rolling walker  Activity Tolerance: Patient limited by fatigue Patient left: in bed;with call bell/phone within reach  OT Visit Diagnosis: Unsteadiness on feet (R26.81);Muscle weakness (generalized) (M62.81)                Time: 1601-0932 OT Time Calculation (min): 22 min Charges:  OT General Charges $OT Visit: 1 Visit OT Evaluation $OT Eval Moderate Complexity: 1 Mod  Nestor Lewandowsky, OTR/L Acute Rehabilitation Services Pager: [phone] Office: [phone]  Malka So 09/11/2018, 4:10 PM

## 2018-09-11 NOTE — Progress Notes (Signed)
  Progress Note    09/11/2018 11:41 AM * No surgery date entered *  Subjective: No new complaints, dialysis was initiated yesterday.  Vitals:   09/11/18 0458 09/11/18 0855  BP: 119/75 127/73  Pulse: 70 67  Resp: 16 18  Temp: 98.1 F (36.7 C) 97.8 F (36.6 C)  SpO2: 99% 96%    Physical Exam: Awake alert oriented There is pulsatility in his left arm fistula.   Palpable popliteal pulses bilaterally   CBC    Component Value Date/Time   WBC 10.2 09/11/2018 0455   RBC 3.56 (L) 09/11/2018 0455   HGB 8.1 (L) 09/11/2018 0455   HCT 26.1 (L) 09/11/2018 0455   PLT 235 09/11/2018 0455   MCV 73.3 (L) 09/11/2018 0455   MCH 22.8 (L) 09/11/2018 0455   MCHC 31.0 09/11/2018 0455   RDW 15.4 09/11/2018 0455   LYMPHSABS 1.3 09/10/2018 0034   MONOABS 1.5 (H) 09/10/2018 0034   EOSABS 0.1 09/10/2018 0034   BASOSABS 0.1 09/10/2018 0034    BMET    Component Value Date/Time   NA 139 09/11/2018 0455   K 2.9 (L) 09/11/2018 0455   CL 105 09/11/2018 0455   CO2 21 (L) 09/11/2018 0455   GLUCOSE 144 (H) 09/11/2018 0455   BUN 81 (H) 09/11/2018 0455   CREATININE 6.88 (H) 09/11/2018 0455   CALCIUM 6.3 (LL) 09/11/2018 0455   GFRNONAA 8 (L) 09/11/2018 0455   GFRAA 9 (L) 09/11/2018 0455    INR    Component Value Date/Time   INR 1.17 03/31/2017 0004     Intake/Output Summary (Last 24 hours) at 09/11/2018 1141 Last data filed at 09/11/2018 0900 Gross per 24 hour  Intake 554.67 ml  Output 0 ml  Net 554.67 ml     Assessment/plan:  57 y.o. male is now in end-stage renal disease in need of dialysis.  Fistula seems underdeveloped for the amount of time it is been in place and will need fistulogram tomorrow.  In conjunction with this we will perform aortogram with right lower extremity evaluation as he likely has tibial disease leading to the breakdown of his previously healed transmetatarsal amputation.  I discussed this with him he demonstrates good understanding and is willing to proceed  tomorrow.     Alquan Morrish C. Donzetta Matters, MD [facility] Office: [phone] Pager: [phone]  09/11/2018 11:41 AM

## 2018-09-12 ENCOUNTER — Encounter (HOSPITAL_COMMUNITY)
Admission: EM | Disposition: A | Discharge status: Home / Self Care | Payer: Self-pay | Source: Home / Self Care | Attending: Internal Medicine

## 2018-09-12 DIAGNOSIS — I739 Peripheral vascular disease, unspecified: Secondary | ICD-10-CM

## 2018-09-12 DIAGNOSIS — I998 Other disorder of circulatory system: Secondary | ICD-10-CM

## 2018-09-12 DIAGNOSIS — N186 End stage renal disease: Secondary | ICD-10-CM

## 2018-09-12 DIAGNOSIS — Z992 Dependence on renal dialysis: Secondary | ICD-10-CM

## 2018-09-12 DIAGNOSIS — T82898A Other specified complication of vascular prosthetic devices, implants and grafts, initial encounter: Secondary | ICD-10-CM

## 2018-09-12 HISTORY — PX: A/V FISTULAGRAM: CATH118298

## 2018-09-12 HISTORY — PX: ABDOMINAL AORTOGRAM W/LOWER EXTREMITY: CATH118223

## 2018-09-12 LAB — CBC
HEMATOCRIT: 26.9 % — AB (ref 39.0–52.0)
Hemoglobin: 8.1 g/dL — ABNORMAL LOW (ref 13.0–17.0)
MCH: 22.5 pg — ABNORMAL LOW (ref 26.0–34.0)
MCHC: 30.1 g/dL (ref 30.0–36.0)
MCV: 74.7 fL — AB (ref 80.0–100.0)
NRBC: 0 % (ref 0.0–0.2)
Platelets: 250 10*3/uL (ref 150–400)
RBC: 3.6 MIL/uL — AB (ref 4.22–5.81)
RDW: 15.6 % — ABNORMAL HIGH (ref 11.5–15.5)
WBC: 11.7 10*3/uL — AB (ref 4.0–10.5)

## 2018-09-12 LAB — BASIC METABOLIC PANEL
ANION GAP: 15 (ref 5–15)
BUN: 87 mg/dL — ABNORMAL HIGH (ref 6–20)
CO2: 21 mmol/L — AB (ref 22–32)
Calcium: 6.7 mg/dL — ABNORMAL LOW (ref 8.9–10.3)
Chloride: 103 mmol/L (ref 98–111)
Creatinine, Ser: 7.84 mg/dL — ABNORMAL HIGH (ref 0.61–1.24)
GFR calc non Af Amer: 7 mL/min — ABNORMAL LOW (ref >60)
GFR, EST AFRICAN AMERICAN: 8 mL/min — AB (ref >60)
GLUCOSE: 155 mg/dL — AB (ref 70–99)
POTASSIUM: 3.5 mmol/L (ref 3.5–5.1)
Sodium: 139 mmol/L (ref 135–145)

## 2018-09-12 LAB — HEPATITIS B SURFACE ANTIBODY,QUALITATIVE: Hep B S Ab: REACTIVE

## 2018-09-12 LAB — GLUCOSE, CAPILLARY
GLUCOSE-CAPILLARY: 120 mg/dL — AB (ref 70–99)
Glucose-Capillary: 117 mg/dL — ABNORMAL HIGH (ref 70–99)
Glucose-Capillary: 168 mg/dL — ABNORMAL HIGH (ref 70–99)
Glucose-Capillary: 230 mg/dL — ABNORMAL HIGH (ref 70–99)
Glucose-Capillary: 240 mg/dL — ABNORMAL HIGH (ref 70–99)

## 2018-09-12 LAB — MAGNESIUM: Magnesium: 2.6 mg/dL — ABNORMAL HIGH (ref 1.7–2.4)

## 2018-09-12 LAB — HEPATITIS B CORE ANTIBODY, TOTAL: Hep B Core Total Ab: POSITIVE — AB

## 2018-09-12 SURGERY — ABDOMINAL AORTOGRAM W/LOWER EXTREMITY
Anesthesia: LOCAL

## 2018-09-12 MED ORDER — SODIUM CHLORIDE 0.9 % IV SOLN
INTRAVENOUS | Status: DC
  Administered 2018-09-12 – 2018-09-15 (×3): via INTRAVENOUS

## 2018-09-12 MED ORDER — ACETAMINOPHEN 325 MG PO TABS: 650.0000 mg | ORAL_TABLET | ORAL | Status: DC | PRN

## 2018-09-12 MED ORDER — SODIUM CHLORIDE 0.9% FLUSH: 3.0000 mL | INTRAVENOUS | Status: DC | PRN

## 2018-09-12 MED ORDER — IODIXANOL 320 MG/ML IV SOLN
INTRAVENOUS | Status: DC | PRN
  Administered 2018-09-12: 140 mL via INTRA_ARTERIAL

## 2018-09-12 MED ORDER — ONDANSETRON HCL 4 MG/2ML IJ SOLN
INTRAMUSCULAR | Status: AC
  Filled 2018-09-12: qty 2

## 2018-09-12 MED ORDER — HEPARIN (PORCINE) IN NACL 1000-0.9 UT/500ML-% IV SOLN
INTRAVENOUS | Status: DC | PRN
  Administered 2018-09-12 (×3): 500 mL

## 2018-09-12 MED ORDER — HEPARIN (PORCINE) IN NACL 1000-0.9 UT/500ML-% IV SOLN
INTRAVENOUS | Status: AC
  Filled 2018-09-12: qty 500

## 2018-09-12 MED ORDER — SODIUM CHLORIDE 0.9 % IV SOLN: 250.0000 mL | INTRAVENOUS | Status: DC | PRN

## 2018-09-12 MED ORDER — LIDOCAINE HCL (PF) 1 % IJ SOLN
INTRAMUSCULAR | Status: AC
  Filled 2018-09-12: qty 30

## 2018-09-12 MED ORDER — SODIUM CHLORIDE 0.9% FLUSH
3.0000 mL | Freq: Two times a day (BID) | INTRAVENOUS | Status: DC
  Administered 2018-09-12 – 2018-09-20 (×14): 3 mL via INTRAVENOUS

## 2018-09-12 MED ORDER — VANCOMYCIN VARIABLE DOSE PER UNSTABLE RENAL FUNCTION (PHARMACIST DOSING): Status: DC

## 2018-09-12 MED ORDER — LIDOCAINE HCL (PF) 1 % IJ SOLN
INTRAMUSCULAR | Status: DC | PRN
  Administered 2018-09-12: 2 mL via INTRADERMAL
  Administered 2018-09-12: 15 mL via INTRADERMAL

## 2018-09-12 MED ORDER — CEFAZOLIN SODIUM-DEXTROSE 2-4 GM/100ML-% IV SOLN
2.0000 g | INTRAVENOUS | Status: AC
  Administered 2018-09-13: 2 g via INTRAVENOUS

## 2018-09-12 MED ORDER — HYDRALAZINE HCL 20 MG/ML IJ SOLN: 5.0000 mg | INTRAMUSCULAR | Status: DC | PRN

## 2018-09-12 MED ORDER — LABETALOL HCL 5 MG/ML IV SOLN: 10.0000 mg | INTRAVENOUS | Status: DC | PRN

## 2018-09-12 MED ORDER — ONDANSETRON HCL 4 MG/2ML IJ SOLN
4.0000 mg | Freq: Four times a day (QID) | INTRAMUSCULAR | Status: DC | PRN
  Administered 2018-09-12: 4 mg via INTRAVENOUS

## 2018-09-12 SURGICAL SUPPLY — 16 items
BAG SNAP BAND KOVER 36X36 (MISCELLANEOUS) ×2 IMPLANT
CATH OMNI FLUSH 5F 65CM (CATHETERS) ×2 IMPLANT
COVER DOME SNAP 22 D (MISCELLANEOUS) ×2 IMPLANT
DEVICE CLOSURE MYNXGRIP 5F (Vascular Products) ×2 IMPLANT
KIT MICROPUNCTURE NIT STIFF (SHEATH) ×2 IMPLANT
KIT PV (KITS) ×2 IMPLANT
PROTECTION STATION PRESSURIZED (MISCELLANEOUS) ×2
SHEATH PINNACLE 5F 10CM (SHEATH) ×2 IMPLANT
SHEATH PROBE COVER 6X72 (BAG) ×2 IMPLANT
STATION PROTECTION PRESSURIZED (MISCELLANEOUS) ×1 IMPLANT
STOPCOCK MORSE 400PSI 3WAY (MISCELLANEOUS) ×2 IMPLANT
SYR MEDRAD MARK V 150ML (SYRINGE) ×2 IMPLANT
TRANSDUCER W/STOPCOCK (MISCELLANEOUS) ×2 IMPLANT
TRAY PV CATH (CUSTOM PROCEDURE TRAY) ×2 IMPLANT
TUBING CIL FLEX 10 FLL-RA (TUBING) ×2 IMPLANT
WIRE BENTSON .035X145CM (WIRE) ×2 IMPLANT

## 2018-09-12 NOTE — Progress Notes (Signed)
Subjective: Notes reviewed.  Pt without c/o.  He denies any pain in the right foot.  He is scheduled for surgery tomorrow for possible revascularization of the R LE.   Objective: Vital signs in last 24 hours: Temp:  [97.7 F (36.5 C)-98.3 F (36.8 C)] 98.3 F (36.8 C) (10/10 1222) Pulse Rate:  [64-76] 75 (10/10 1222) Resp:  [0-30] 11 (10/10 1222) BP: (122-173)/(40-76) 158/70 (10/10 1222) SpO2:  [0 %-99 %] 98 % (10/10 1222) Weight:  [99.3 kg] 99.3 kg (10/09 2105)  Intake/Output from previous day: 10/09 0701 - 10/10 0700 In: 713.1 [P.O.:600; IV Piggyback:113.1] Out: 550 [Urine:550] Intake/Output this shift: No intake/output data recorded.  Recent Labs    09/09/18 1951 09/10/18 0034 09/11/18 0455 09/12/18 0525  HGB 8.8* 8.9* 8.1* 8.1*   Recent Labs    09/11/18 0455 09/12/18 0525  WBC 10.2 11.7*  RBC 3.56* 3.60*  HCT 26.1* 26.9*  PLT 235 250   Recent Labs    09/11/18 0455 09/12/18 0525  NA 139 139  K 2.9* 3.5  CL 105 103  CO2 21* 21*  BUN 81* 87*  CREATININE 6.88* 7.84*  GLUCOSE 144* 155*  CALCIUM 6.3* 6.7*   No results for input(s): LABPT, INR in the last 72 hours.  PE  WN WD man in nad.  A and O x 4.  Mood and affect normal.  EOMI.  resp unlabored.  R foot dressed and dry.     Assessment/Plan: R foot diabetic ulcer - continue local wound care.  Improved blood flow should allow the wound to heal.  F/u in my office in a few weeks to check L prosthesis fit.  I'll sign off now.  Please call with any questions.  [place] 09/12/2018, 1:53 PM

## 2018-09-12 NOTE — Progress Notes (Signed)
  Progress Note    09/12/2018 7:44 AM   Subjective: No new complaints, dialysis was initiated.  Vitals:   09/12/18 0422 09/12/18 0744  BP: 122/72   Pulse: 66   Resp: 18   Temp: 98.1 F (36.7 C)   SpO2: 98% 95%    Physical Exam: Awake alert oriented There is pulsatility in his left arm fistula.   Palpable popliteal pulses bilaterally   CBC    Component Value Date/Time   WBC 11.7 (H) 09/12/2018 0525   RBC 3.60 (L) 09/12/2018 0525   HGB 8.1 (L) 09/12/2018 0525   HCT 26.9 (L) 09/12/2018 0525   PLT 250 09/12/2018 0525   MCV 74.7 (L) 09/12/2018 0525   MCH 22.5 (L) 09/12/2018 0525   MCHC 30.1 09/12/2018 0525   RDW 15.6 (H) 09/12/2018 0525   LYMPHSABS 1.3 09/10/2018 0034   MONOABS 1.5 (H) 09/10/2018 0034   EOSABS 0.1 09/10/2018 0034   BASOSABS 0.1 09/10/2018 0034    BMET    Component Value Date/Time   NA 139 09/12/2018 0525   K 3.5 09/12/2018 0525   CL 103 09/12/2018 0525   CO2 21 (L) 09/12/2018 0525   GLUCOSE 155 (H) 09/12/2018 0525   BUN 87 (H) 09/12/2018 0525   CREATININE 7.84 (H) 09/12/2018 0525   CALCIUM 6.7 (L) 09/12/2018 0525   GFRNONAA 7 (L) 09/12/2018 0525   GFRAA 8 (L) 09/12/2018 0525    INR    Component Value Date/Time   INR 1.17 03/31/2017 0004     Intake/Output Summary (Last 24 hours) at 09/12/2018 0744 Last data filed at 09/12/2018 0422 Gross per 24 hour  Intake 713.11 ml  Output 550 ml  Net 163.11 ml     Assessment/plan:  57 y.o. male is now in end-stage renal disease that has started dialysis.  LUE fistulogram and RLE arteriogram today (non-healing TMA).    Marty Heck, MD [facility] Office: [phone] Pager: [place]

## 2018-09-12 NOTE — Op Note (Addendum)
Patient name: [name] MRN: [id number] DOB: 05-05-1961 Sex: male  09/12/2018 Pre-operative Diagnosis: Critical limb ischemia of the right lower extremity with nonhealing TMA as well as non-maturing left upper extremity brachiocephalic fistula Post-operative diagnosis:  Same Surgeon:  Marty Heck, MD Procedure Performed: 1.  Ultrasound-guided access of the left upper extremity brachiocephalic AV fistula 2.  Left upper extremity fistulogram 3.  Ultrasound-guided access of the left common femoral artery 4.  Aortogram with right lower extremity arteriogram  5.  Mynx closure of the right common femoral artery  Indications: Patient is a 57 year old male who has now started dialysis with end-stage renal disease.  He was seen in consultation earlier this week for a non-maturing left upper extremity brachiocephalic fistula as well as a nonhealing wound on his right TMA.  He presents today for left upper extremity fistulogram as well as aortogram with right lower extremity arteriogram after risks and benefits were discussed.  Findings:  1.  Left upper extremity fistulogram shows a patent brachiocephalic fistula with no evidence of stenosis at the arterial anastomosis and no evidence of a central venous stenosis.  He has a multitude of side branches throughout the distal to mid arm coming off the cephalic vein that are likely leading to difficulty with maturation. 2.  Aortogram and right lower extremity arteriogram showed no significant aortoiliac disease.  His right common femoral and profunda were widely patent.  He did have several areas of approximate 50% stenosis throughout the right SFA that were heavily calcified.  We did not feel this was flow-limiting given that he had a palpable popliteal pulse on exam.  Significant tibial disease with occlusion of the anterior tibial shortly after takeoff.  The peroneal artery appears occluded throughout its course minus a short proximal stump.   His dominant runoff appears to be through a heavily diseased posterior tibial that is occluded in the mid calf.  He has no distal reconstitution of a viable vessel and may reconstitute a very small posterior tibial that is not a suitable bypass.   Procedure:    The patient was identified in the holding area and taken to room 8.  The patient was then placed supine on the table and prepped and draped in the usual sterile fashion.  A time out was called.   Ultrasound was initially used to evaluate his left brachiocephalic fistula that was accessed with a micro access needle under ultrasound guidance.  An image was saved.  We then threaded a micro wire and then a small micro-sheath into the brachiocephalic fistula approximately 2 cm.  A left upper extremity fistulogram was obtained including central runoff.  We also placed a blood pressure cuff on the upper arm to get a retrograde shot of the arterial anastomosis and proximal portion of the fistula.  As noted above the fistula in the mid to upper arm as well as centrally appears to be maturing nicely.  The segment in the distal arm is smaller given multiple side branches that are likely preventing maturation.  The micro sheath was then removed and a figure of eight stitch was placed with a 4-0 Monocryl.  Ultrasound was then used to evaluate the left common femoral artery.  It was patent .  A digital ultrasound image was acquired.  A micropuncture needle was used to access the left common femoral artery under ultrasound guidance.  An 018 wire was advanced without resistance and a micropuncture sheath was placed.  The 018 wire was removed and  a benson wire was placed.  The micropuncture sheath was exchanged for a 5 french sheath.  An omniflush catheter was advanced over the wire to the level of L-1.  An abdominal angiogram was obtained.  Next, using the omniflush catheter and a benson wire, the aortic bifurcation was crossed and the catheter was placed into  theright external iliac artery and right runoff was obtained.  As noted above he had no significant aortoiliac disease.  His SFA had multiple segments of approximately 50% calcified stenosis.  We did not feel that this was flow-limiting given that he had a palpable popliteal pulse on exam.  His problem is largely related to his tibial disease.  His peroneal and anterior tibial are occluded.  His dominant runoff is via the posterior tibial but unfortunately it occludes in the mid calf after being heavily diseased in the proximal segment with greater than 90% stenosis and likely CTO throughout its proximal course.  He only has collateral runoff in the distal right calf and rihtfoot.  I saw no named vessel in the right foot that we could use for target or for retrograde tibial access.  We did not attempt intervention since we could not get in-line flow for wound healing with no distal target.  Our wires and catheters were then removed.  A mynx closure device was placed in the left common femoral artery.  Plan: Patient will need open revision of his left brachiocephalic fistula with branch ligation.  His right lower extremity arteriogram showed no reasonable reasonable options for endovascular revascularization given no distal target except for maybe a very small posterior tibial and only collateral runoff.  He will need aggressive wound care.  Marty Heck, MD [facility] [place] Office: [phone] Pager: [place]

## 2018-09-12 NOTE — Progress Notes (Signed)
A 1. New ESRD 1. LUE AVF for revision  & TDC 10/11 2. CLIP in process 3. HD#1 started 09/10/18 with next HD 10/11 via TDC 2. R TMA Foot ulcer, VVS Dr. Donzetta Matters plans RLE agram on 10/11 3. CKD BMD: PTH acceptable  4. Anemia; Aranesp 10/10 5. DM2 6. HTN 7. ASCVD/PAD  P  HD tomorrow: 3K, 3h, Qb 250, 17g x2, No heparin, 1L UF  Subjective: Interval History: poor appetite, gen malaise; had fistulogram  Objective: Vital signs in last 24 hours: Temp:  [97.7 F (36.5 C)-98.3 F (36.8 C)] 98.3 F (36.8 C) (10/10 1222) Pulse Rate:  [64-76] 75 (10/10 1222) Resp:  [0-30] 11 (10/10 1222) BP: (122-173)/(40-76) 158/70 (10/10 1222) SpO2:  [0 %-99 %] 98 % (10/10 1222) Weight:  [99.3 kg] 99.3 kg (10/09 2105) Weight change: 0.3 kg  Intake/Output from previous day: 10/09 0701 - 10/10 0700 In: 713.1 [P.O.:600; IV Piggyback:113.1] Out: 550 [Urine:550] Intake/Output this shift: No intake/output data recorded.  General appearance: alert and cooperative Extremities: No CCE, r foot bandaged Neurologic: Motor: njo asterixis  Lab Results: Recent Labs    09/11/18 0455 09/12/18 0525  WBC 10.2 11.7*  HGB 8.1* 8.1*  HCT 26.1* 26.9*  PLT 235 250   BMET:  Recent Labs    09/11/18 0455 09/12/18 0525  NA 139 139  K 2.9* 3.5  CL 105 103  CO2 21* 21*  GLUCOSE 144* 155*  BUN 81* 87*  CREATININE 6.88* 7.84*  CALCIUM 6.3* 6.7*   Recent Labs    09/10/18 0526  PTH 275*   Iron Studies:  Recent Labs    09/10/18 0526  IRON 39*  TIBC 165*  FERRITIN 735*   Studies/Results: Mr Foot Right Wo Contrast  Result Date: 09/11/2018 CLINICAL DATA:  57 year old male presents with 1 week history of right foot pressure worse in the lateral aspect with purulent drainage and skin breakdown x2 weeks prior. EXAM: MRI OF THE RIGHT FOREFOOT WITHOUT CONTRAST TECHNIQUE: Multiplanar, multisequence MR imaging of the right foot was performed. No intravenous contrast was administered. COMPARISON:  None.  FINDINGS: Bones/Joint/Cartilage Subtle cortical irregularity and marrow edema along the plantar lateral aspect of the fifth metatarsal base, adjacent to an area of subcutaneous soft tissue edema/cellulitis and ulceration, series 5/35. No acute fracture. No suspicious osseous lesions. The tibiotalar and subtalar joints are maintained. Mild osteoarthritic joint space narrowing of the midfoot. No joint effusion. Ligaments Ankle ligaments intact. Muscles and Tendons The tendons crossing the ankle joint appear intact. No evidence of pyomyositis. Soft tissues Subcutaneous soft tissue edema compatible cellulitis along the plantar lateral aspect of the midfoot adjacent to the amputation margin. Tiny 4 mm focus of fluid within the cellulitis is suspicious for tiny abscess series 5/30. Lack of IV contrast limits further assessment. IMPRESSION: Subtle cortical irregularity along the plantar lateral aspect of the fifth metatarsal base adjacent to an area of soft tissue ulceration, cellulitis and tiny presumed 4 mm soft tissue abscess. Findings would be in keeping with early changes of osteomyelitis. Electronically Signed   By: Ashley Royalty M.D.   On: 09/11/2018 14:42    Scheduled: . amLODipine  10 mg Oral QHS  . aspirin EC  81 mg Oral Daily  . atorvastatin  10 mg Oral QHS  . carvedilol  25 mg Oral BID WC  . Chlorhexidine Gluconate Cloth  6 each Topical Q0600  . darbepoetin (ARANESP) injection - DIALYSIS  60 mcg Intravenous Q Thu-HD  . feeding supplement (NEPRO CARB STEADY)  237 mL Oral BID BM  . gabapentin  300 mg Oral QHS  . insulin aspart  0-9 Units Subcutaneous Q4H  . insulin glargine  10 Units Subcutaneous BID  . multivitamin  1 tablet Oral QHS  . mupirocin ointment   Nasal BID  . pantoprazole  40 mg Oral Daily  . sodium chloride flush  3 mL Intravenous Q12H  . vancomycin variable dose per unstable renal function (pharmacist dosing)   Does not apply See admin instructions     LOS: 3 days   Estanislado Emms 09/12/2018,3:11 PM

## 2018-09-12 NOTE — Progress Notes (Signed)
   I reviewed today's angiographic pictures.  We will plan for revision of left arm AV fistula tunneled dialysis catheter placement tomorrow in the OR and he can begin dialyzing via the catheter at that point.  Would hold on dialysis via fistula if at all possible until revision and further maturation.  I will also plan retrograde tibial access from his right lower extremity on Monday afternoon to attempt revascularization of his right lower extremity in hopes of salvage of his transmetatarsal amputation.  Kamiyah Kindel C. Donzetta Matters, MD [facility] Office: [phone] Pager: [phone]

## 2018-09-12 NOTE — Progress Notes (Signed)
PT Cancellation Note  Patient Details Name: [name] MRN: [id number] DOB: 08-09-1961   Cancelled Treatment:    Reason Eval/Treat Not Completed: (P) Patient at procedure or test/unavailable Pt in the cath lab this am. PT will follow back this afternoon as able.  Levin Dagostino B. Migdalia Dk PT, DPT Acute Rehabilitation Services Pager [phone] Office [phone]    [place] 09/12/2018, 8:25 AM

## 2018-09-12 NOTE — Progress Notes (Signed)
Pt with emesis. Zofran given. Pt states this is common in mornings.

## 2018-09-12 NOTE — Progress Notes (Signed)
PT Cancellation Note  Patient Details Name: [name] MRN: [id number] DOB: [birth year]   Cancelled Treatment:     pt sleeping declined therapy despite motivation, asked we come back tomorrow.   Reinaldo Berber, PT, DPT Acute Rehabilitation Services Pager: [phone] Office: [place] 09/12/2018, 5:18 PM

## 2018-09-12 NOTE — Progress Notes (Signed)
PROGRESS NOTE    SHANNON KIRKENDALL  JHE:[id number] DOB: 05/22/1961 DOA: 09/09/2018 PCP: System, Pcp Not In    Brief Narrative:  57 y.o.malewith medical history significant of advanced CKD, left below-knee amputation, right transmetatarsal foot amputation, hypertension, diabetes, peripheral neuropathy, hyperlipidemia, tobacco abusenow in remission.  Presented with1 week history of right foot pressure worse in the lateral aspect of her right foot and purulent drainage he noticed skin breakdown of the foot about 2 weeks ago. Patient reports he has decreased sensation in his lower extremities and unable to feel pain.  Cr greater than 6. Still makes urine Last time had seen nephrology was 6 m ago. Had Left AC fistula placed in October 2018.  09/10/2018: Patient seen and examined at his bedside.  Reports significant pain in his right stump.  Pain medication in place as needed.  Orthopedic surgery consulted.  ABI done which revealed moderate peripheral artery disease.  Vascular surgery consulted for possible angiogram and intervention.  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)   Unilateral complete BKA, left, sequela (HCC)   Chronic kidney disease (CKD), stage IV (severe) (HCC)   Type 2 diabetes mellitus with peripheral neuropathy (HCC)   Hyperlipidemia   Diabetic foot ulcer (HCC)   Hypocalcemia   Dehydration  Right foot ulcer -Positive ABI with moderate disease -Positive for MRSA screening -Elevated sed rate and CRP noted -R foot MRI done, findings of tiny presumed soft tissue abscess  -Vascular surgery following, s/p aortogram of LE and fistuologram UE -Plans for revision of L arm AV fistula tunneled cath tomorrow with revascularization of RLE next week -Continued on empiric on IV Rocephin and IV Flagyl -Vanc was added due to positive MRSA screening -Blood  cultures noted to be negative thus far  Moderate peripheral artery disease -Continue aspirin and Lipitor -Vascular surgery consulted and pt s/p fistulogram and aortogram as per aboove  Severe anion gap metabolic acidosis most likely secondary to renal failure versus others -serum bicarb of 21 today -Now on HD, clip in progress  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 -Nephrology consulted and following -s/p fistuogram with plans for revison of L arm AV fistula tomorrow -tolerated HD earlier -CLIP in progress  Severe hypomagnesemia -Mg replaced, labs reviewed  Hyperphosphatemia -Recheck bmet in AM -Stable at present  Hypertension -BP stable at present, albeit suboptimally controlled -will continue with amlodipine, carvedilol as tolerated  DVT prophylaxis: Heparin subq Code Status: Full Family Communication: Pt in room, family not at bedside Disposition Plan: Uncertain at this time  Consultants:   Vascular surgery  Orthopedic surgery  Nephrology  Procedures:   Rocephin and IV Flagyl  Antimicrobials: Anti-infectives (From admission, onward)   Start     Dose/Rate Route Frequency Ordered Stop   09/10/18 0030  cefTRIAXone (ROCEPHIN) 2 g in sodium chloride 0.9 % 100 mL IVPB     2 g 200 mL/hr over 30 Minutes Intravenous Daily at bedtime 09/09/18 2344     09/10/18 0000  metroNIDAZOLE (FLAGYL) IVPB 500 mg     500 mg 100 mL/hr over 60 Minutes Intravenous Every 8 hours 09/09/18 2344     09/09/18 2115  piperacillin-tazobactam (ZOSYN) IVPB 3.375 g  Status:  Discontinued     3.375 g 100 mL/hr over 30 Minutes Intravenous  Once 09/09/18 2100 09/09/18 2108   09/09/18 2115  piperacillin-tazobactam (ZOSYN) IVPB 2.25 g  2.25 g 100 mL/hr over 30 Minutes Intravenous  Once 09/09/18 2108 09/09/18 2207   09/09/18 2115  vancomycin (VANCOCIN) 2,000 mg in sodium chloride 0.9 % 500 mL IVPB     2,000 mg 250 mL/hr over 120 Minutes Intravenous  Once 09/09/18 2109 09/09/18 2334       Subjective: Without complaints  Objective: Vitals:   09/12/18 1100 09/12/18 1115 09/12/18 1217 09/12/18 1222  BP: (!) 161/40 (!) 123/43 (!) 158/70 (!) 158/70  Pulse: 75 76  75  Resp: 11 14  11   Temp:    98.3 F (36.8 C)  TempSrc:    Oral  SpO2: 99% 97%  98%  Weight:      Height:        Intake/Output Summary (Last 24 hours) at 09/12/2018 1423 Last data filed at 09/12/2018 0422 Gross per 24 hour  Intake 300 ml  Output 550 ml  Net -250 ml   Filed Weights   09/10/18 1848 09/10/18 2206 09/11/18 2105  Weight: 99.3 kg 99.3 kg 99.3 kg    Examination: General exam: Awake, laying in bed, in nad, ambulated to bed with prosthetic Respiratory system: Normal respiratory effort, no wheezing Cardiovascular system: regular rate, s1, s2 Gastrointestinal system: Soft, nondistended, positive BS Central nervous system: CN2-12 grossly intact, strength intact Extremities: s/p LE amputation Skin: Normal skin turgor, no notable skin lesions seen Psychiatry: Mood normal // no visual hallucinations   Data Reviewed: I have personally reviewed following labs and imaging studies  CBC: Recent Labs  Lab 09/09/18 1951 09/10/18 0034 09/11/18 0455 09/12/18 0525  WBC 14.8* 13.2* 10.2 11.7*  NEUTROABS 12.3* 10.2*  --   --   HGB 8.8* 8.9* 8.1* 8.1*  HCT 27.7* 29.4* 26.1* 26.9*  MCV 72.1* 75.6* 73.3* 74.7*  PLT 295 281 235 121   Basic Metabolic Panel: Recent Labs  Lab 09/09/18 1951 09/10/18 0034 09/11/18 0455 09/12/18 0525  NA 140 139 139 139  K 3.9 3.9 2.9* 3.5  CL 111 114* 105 103  CO2 11* 11* 21* 21*  GLUCOSE 160* 153* 144* 155*  BUN 118* 120* 81* 87*  CREATININE 9.37* 9.09* 6.88* 7.84*  CALCIUM 6.2* 6.5* 6.3* 6.7*  MG 1.2* 1.1* 1.4* 2.6*  PHOS 7.6* 7.6* 5.5*  --    GFR: Estimated Creatinine Clearance: 12.5 mL/min (A) (by C-G formula based on SCr of 7.84 mg/dL (H)). Liver Function Tests: Recent Labs  Lab 09/09/18 1951 09/10/18 0034 09/10/18 1930  AST 20 18  --    ALT 30 26 24   ALKPHOS 66 55  --   BILITOT 0.4 0.6  --   PROT 8.3* 7.7  --   ALBUMIN 3.2* 2.9*  --    No results for input(s): LIPASE, AMYLASE in the last 168 hours. No results for input(s): AMMONIA in the last 168 hours. Coagulation Profile: No results for input(s): INR, PROTIME in the last 168 hours. Cardiac Enzymes: No results for input(s): CKTOTAL, CKMB, CKMBINDEX, TROPONINI in the last 168 hours. BNP (last 3 results) No results for input(s): PROBNP in the last 8760 hours. HbA1C: Recent Labs    09/10/18 0034  HGBA1C 7.5*   CBG: Recent Labs  Lab 09/11/18 1731 09/11/18 2105 09/12/18 0034 09/12/18 0421 09/12/18 0926  GLUCAP 144* 270* 230* 120* 168*   Lipid Profile: No results for input(s): CHOL, HDL, LDLCALC, TRIG, CHOLHDL, LDLDIRECT in the last 72 hours. Thyroid Function Tests: Recent Labs    09/10/18 0034  TSH 3.274   Anemia Panel: Recent Labs  09/09/18 1951 09/10/18 0526  VITAMINB12 655  --   FOLATE 7.1  --   FERRITIN 841* 735*  TIBC 204* 165*  IRON 42* 39*  RETICCTPCT 1.8  --    Sepsis Labs: Recent Labs  Lab 09/09/18 2000  LATICACIDVEN 0.56    Recent Results (from the past 240 hour(s))  Blood Cultures x 2 sites     Status: None (Preliminary result)   Collection Time: 09/10/18 12:30 AM  Result Value Ref Range Status   Specimen Description BLOOD LEFT ARM  Final   Special Requests   Final    BOTTLES DRAWN AEROBIC AND ANAEROBIC Blood Culture adequate volume   Culture   Final    NO GROWTH 2 DAYS Performed at [facility], [address] [place], [place] [address]    Report Status PENDING  Incomplete  Blood Cultures x 2 sites     Status: None (Preliminary result)   Collection Time: 09/10/18 12:34 AM  Result Value Ref Range Status   Specimen Description BLOOD LEFT HAND  Final   Special Requests   Final    BOTTLES DRAWN AEROBIC ONLY Blood Culture adequate volume   Culture   Final    NO GROWTH 2 DAYS Performed at [facility], [place] [address], [place], [place] [address]    Report Status PENDING  Incomplete  MRSA PCR Screening     Status: Abnormal   Collection Time: 09/10/18  8:51 AM  Result Value Ref Range Status   MRSA by PCR POSITIVE (A) NEGATIVE Final    Comment:        The GeneXpert MRSA Assay (FDA approved for NASAL specimens only), is one component of a comprehensive MRSA colonization surveillance program. It is not intended to diagnose MRSA infection nor to guide or monitor treatment for MRSA infections. RESULT CALLED TO, READ BACK BY AND VERIFIED WITH: RN Chapman Moss [id number] MLM Performed at [facility] [address] [place],  [address]      Radiology Studies: Mr Foot Right Wo Contrast  Result Date: 09/11/2018 CLINICAL DATA:  57 year old male presents with 1 week history of right foot pressure worse in the lateral aspect with purulent drainage and skin breakdown x2 weeks prior. EXAM: MRI OF THE RIGHT FOREFOOT WITHOUT CONTRAST TECHNIQUE: Multiplanar, multisequence MR imaging of the right foot was performed. No intravenous contrast was administered. COMPARISON:  None. FINDINGS: Bones/Joint/Cartilage Subtle cortical irregularity and marrow edema along the plantar lateral aspect of the fifth metatarsal base, adjacent to an area of subcutaneous soft tissue edema/cellulitis and ulceration, series 5/35. No acute fracture. No suspicious osseous lesions. The tibiotalar and subtalar joints are maintained. Mild osteoarthritic joint space narrowing of the midfoot. No joint effusion. Ligaments Ankle ligaments intact. Muscles and Tendons The tendons crossing the ankle joint appear intact. No evidence of pyomyositis. Soft tissues Subcutaneous soft tissue edema compatible cellulitis along the plantar lateral aspect of the midfoot adjacent to the amputation margin. Tiny 4 mm focus of fluid within the cellulitis is suspicious for tiny abscess series 5/30. Lack of IV contrast limits further assessment.  IMPRESSION: Subtle cortical irregularity along the plantar lateral aspect of the fifth metatarsal base adjacent to an area of soft tissue ulceration, cellulitis and tiny presumed 4 mm soft tissue abscess. Findings would be in keeping with early changes of osteomyelitis. Electronically Signed   By: Ashley Royalty M.D.   On: 09/11/2018 14:42    Scheduled Meds: . amLODipine  10 mg  Oral QHS  . aspirin EC  81 mg Oral Daily  . atorvastatin  10 mg Oral QHS  . carvedilol  25 mg Oral BID WC  . Chlorhexidine Gluconate Cloth  6 each Topical Q0600  . darbepoetin (ARANESP) injection - DIALYSIS  60 mcg Intravenous Q Thu-HD  . feeding supplement (NEPRO CARB STEADY)  237 mL Oral BID BM  . gabapentin  300 mg Oral QHS  . insulin aspart  0-9 Units Subcutaneous Q4H  . insulin glargine  10 Units Subcutaneous BID  . multivitamin  1 tablet Oral QHS  . mupirocin ointment   Nasal BID  . pantoprazole  40 mg Oral Daily  . sodium chloride flush  3 mL Intravenous Q12H   Continuous Infusions: . sodium chloride    . sodium chloride    . sodium chloride 10 mL/hr at 09/12/18 0621  . sodium chloride    . cefTRIAXone (ROCEPHIN)  IV 2 g (09/11/18 2203)   And  . metronidazole 500 mg (09/12/18 0130)  . famotidine (PEPCID) IV       LOS: 3 days   Marylu Lund, MD [facility] Pager On Amion  If 7PM-7AM, please contact night-coverage 09/12/2018, 2:23 PM

## 2018-09-12 NOTE — Progress Notes (Signed)
Orthopedic Tech Progress Note Patient Details:  [name] 09/17/1961 [id number]  Patient ID: [name], male   DOB: 14-Dec-1960, 57 y.o.   MRN: [id number]   [facility] [employer] to evaluate [name].

## 2018-09-13 ENCOUNTER — Inpatient Hospital Stay (HOSPITAL_COMMUNITY): Payer: Medicare Other | Admitting: Anesthesiology

## 2018-09-13 ENCOUNTER — Inpatient Hospital Stay (HOSPITAL_COMMUNITY): Payer: Medicare Other

## 2018-09-13 ENCOUNTER — Encounter (HOSPITAL_COMMUNITY)
Admission: EM | Disposition: A | Discharge status: Home / Self Care | Payer: Self-pay | Source: Home / Self Care | Attending: Internal Medicine

## 2018-09-13 ENCOUNTER — Encounter (HOSPITAL_COMMUNITY): Payer: Self-pay | Admitting: Vascular Surgery

## 2018-09-13 HISTORY — PX: PERIPHERAL VASCULAR BALLOON ANGIOPLASTY: CATH118281

## 2018-09-13 HISTORY — PX: REVISON OF ARTERIOVENOUS FISTULA: SHX6074

## 2018-09-13 HISTORY — PX: INSERTION OF DIALYSIS CATHETER: SHX1324

## 2018-09-13 LAB — GLUCOSE, CAPILLARY
GLUCOSE-CAPILLARY: 168 mg/dL — AB (ref 70–99)
Glucose-Capillary: 150 mg/dL — ABNORMAL HIGH (ref 70–99)
Glucose-Capillary: 138 mg/dL — ABNORMAL HIGH (ref 70–99)
Glucose-Capillary: 128 mg/dL — ABNORMAL HIGH (ref 70–99)
Glucose-Capillary: 133 mg/dL — ABNORMAL HIGH (ref 70–99)
Glucose-Capillary: 134 mg/dL — ABNORMAL HIGH (ref 70–99)
Glucose-Capillary: 202 mg/dL — ABNORMAL HIGH (ref 70–99)

## 2018-09-13 SURGERY — REVISON OF ARTERIOVENOUS FISTULA
Anesthesia: Monitor Anesthesia Care | Site: Arm Upper | Laterality: Right

## 2018-09-13 MED ORDER — FENTANYL CITRATE (PF) 250 MCG/5ML IJ SOLN
INTRAMUSCULAR | Status: AC
  Filled 2018-09-13: qty 5

## 2018-09-13 MED ORDER — SODIUM CHLORIDE 0.9 % IV SOLN
INTRAVENOUS | Status: DC | PRN
  Administered 2018-09-13: 500 mL

## 2018-09-13 MED ORDER — LIDOCAINE-EPINEPHRINE (PF) 1 %-1:200000 IJ SOLN
INTRAMUSCULAR | Status: AC
  Filled 2018-09-13: qty 30

## 2018-09-13 MED ORDER — HEPARIN SODIUM (PORCINE) 1000 UNIT/ML IJ SOLN
INTRAMUSCULAR | Status: AC
  Filled 2018-09-13: qty 1

## 2018-09-13 MED ORDER — PROTAMINE SULFATE 10 MG/ML IV SOLN
INTRAVENOUS | Status: DC | PRN
  Administered 2018-09-13: 10 mg via INTRAVENOUS
  Administered 2018-09-13: 20 mg via INTRAVENOUS
  Administered 2018-09-13: 10 mg via INTRAVENOUS

## 2018-09-13 MED ORDER — MIDAZOLAM HCL 2 MG/2ML IJ SOLN
INTRAMUSCULAR | Status: AC
  Filled 2018-09-13: qty 2

## 2018-09-13 MED ORDER — OXYCODONE HCL 5 MG/5ML PO SOLN: 5.0000 mg | Freq: Once | ORAL | Status: DC | PRN

## 2018-09-13 MED ORDER — MORPHINE SULFATE (PF) 2 MG/ML IV SOLN
2.0000 mg | INTRAVENOUS | Status: AC | PRN
  Administered 2018-09-13: 2 mg via INTRAVENOUS
  Filled 2018-09-13 (×2): qty 1

## 2018-09-13 MED ORDER — CEFAZOLIN SODIUM 1 G IJ SOLR
INTRAMUSCULAR | Status: AC
  Filled 2018-09-13: qty 20

## 2018-09-13 MED ORDER — HEPARIN SODIUM (PORCINE) 1000 UNIT/ML IJ SOLN
INTRAMUSCULAR | Status: DC | PRN
  Administered 2018-09-13: 1000 [IU] via INTRAVENOUS

## 2018-09-13 MED ORDER — OXYCODONE HCL 5 MG PO TABS: 5.0000 mg | ORAL_TABLET | Freq: Once | ORAL | Status: DC | PRN

## 2018-09-13 MED ORDER — HYDROMORPHONE HCL 1 MG/ML IJ SOLN: 0.2500 mg | INTRAMUSCULAR | Status: DC | PRN

## 2018-09-13 MED ORDER — PROMETHAZINE HCL 25 MG/ML IJ SOLN: 6.2500 mg | INTRAMUSCULAR | Status: DC | PRN

## 2018-09-13 MED ORDER — ONDANSETRON HCL 4 MG/2ML IJ SOLN
INTRAMUSCULAR | Status: DC | PRN
  Administered 2018-09-13: 4 mg via INTRAVENOUS

## 2018-09-13 MED ORDER — LIDOCAINE HCL (PF) 1 % IJ SOLN
INTRAMUSCULAR | Status: AC
  Filled 2018-09-13: qty 30

## 2018-09-13 MED ORDER — HEPARIN SODIUM (PORCINE) 1000 UNIT/ML IJ SOLN
INTRAMUSCULAR | Status: DC | PRN
  Administered 2018-09-13: 9000 [IU] via INTRAVENOUS

## 2018-09-13 MED ORDER — FENTANYL CITRATE (PF) 100 MCG/2ML IJ SOLN
INTRAMUSCULAR | Status: DC | PRN
  Administered 2018-09-13: 50 ug via INTRAVENOUS

## 2018-09-13 MED ORDER — PROPOFOL 10 MG/ML IV BOLUS
INTRAVENOUS | Status: AC
  Filled 2018-09-13: qty 20

## 2018-09-13 MED ORDER — LIDOCAINE 2% (20 MG/ML) 5 ML SYRINGE
INTRAMUSCULAR | Status: AC
  Filled 2018-09-13: qty 10

## 2018-09-13 MED ORDER — SODIUM CHLORIDE 0.9 % IV SOLN
INTRAVENOUS | Status: AC
  Filled 2018-09-13: qty 1.2

## 2018-09-13 MED ORDER — PROPOFOL 500 MG/50ML IV EMUL
INTRAVENOUS | Status: DC | PRN
  Administered 2018-09-13: 50 ug/kg/min via INTRAVENOUS

## 2018-09-13 MED ORDER — LIDOCAINE-EPINEPHRINE (PF) 1 %-1:200000 IJ SOLN
INTRAMUSCULAR | Status: DC | PRN
  Administered 2018-09-13: 30 mL

## 2018-09-13 MED ORDER — ONDANSETRON HCL 4 MG/2ML IJ SOLN
INTRAMUSCULAR | Status: AC
  Filled 2018-09-13: qty 2

## 2018-09-13 MED ORDER — DARBEPOETIN ALFA 60 MCG/0.3ML IJ SOSY
60.0000 ug | PREFILLED_SYRINGE | INTRAMUSCULAR | Status: DC
  Administered 2018-09-14: 60 ug via INTRAVENOUS

## 2018-09-13 MED ORDER — LIDOCAINE 2% (20 MG/ML) 5 ML SYRINGE
INTRAMUSCULAR | Status: DC | PRN
  Administered 2018-09-13: 60 mg via INTRAVENOUS

## 2018-09-13 MED ORDER — LIDOCAINE HCL (PF) 1 % IJ SOLN
INTRAMUSCULAR | Status: DC | PRN
  Administered 2018-09-13: 30 mL

## 2018-09-13 MED ORDER — DOCUSATE SODIUM 100 MG PO CAPS
100.0000 mg | ORAL_CAPSULE | Freq: Two times a day (BID) | ORAL | Status: DC
  Administered 2018-09-13 – 2018-09-19 (×11): 100 mg via ORAL
  Filled 2018-09-13 (×12): qty 1

## 2018-09-13 MED ORDER — POLYETHYLENE GLYCOL 3350 17 G PO PACK
17.0000 g | PACK | Freq: Every day | ORAL | Status: DC
  Administered 2018-09-17 – 2018-09-18 (×2): 17 g via ORAL
  Filled 2018-09-13 (×3): qty 1

## 2018-09-13 MED ORDER — LACTULOSE 10 GM/15ML PO SOLN
20.0000 g | ORAL | Status: AC
  Filled 2018-09-13: qty 30

## 2018-09-13 MED ORDER — MIDAZOLAM HCL 2 MG/2ML IJ SOLN
INTRAMUSCULAR | Status: DC | PRN
  Administered 2018-09-13: 1 mg via INTRAVENOUS

## 2018-09-13 MED ORDER — PROTAMINE SULFATE 10 MG/ML IV SOLN
INTRAVENOUS | Status: AC
  Filled 2018-09-13: qty 5

## 2018-09-13 MED ORDER — 0.9 % SODIUM CHLORIDE (POUR BTL) OPTIME
TOPICAL | Status: DC | PRN
  Administered 2018-09-13: 1000 mL

## 2018-09-13 SURGICAL SUPPLY — 63 items
ARMBAND PINK RESTRICT EXTREMIT (MISCELLANEOUS) ×5 IMPLANT
BAG DECANTER FOR FLEXI CONT (MISCELLANEOUS) ×5 IMPLANT
BALLN MUSTANG 5.0X40 75 (BALLOONS) ×4
BALLN MUSTANG 5.0X40 75CM (BALLOONS) ×1
BALLOON MUSTANG 5.0X40 75 (BALLOONS) IMPLANT
BIOPATCH RED 1 DISK 7.0 (GAUZE/BANDAGES/DRESSINGS) ×4 IMPLANT
BIOPATCH RED 1IN DISK 7.0MM (GAUZE/BANDAGES/DRESSINGS) ×1
CANISTER SUCT 3000ML PPV (MISCELLANEOUS) ×5 IMPLANT
CANNULA VESSEL 3MM 2 BLNT TIP (CANNULA) ×5 IMPLANT
CATH PALINDROME RT-P 15FX19CM (CATHETERS) IMPLANT
CATH PALINDROME RT-P 15FX23CM (CATHETERS) ×2 IMPLANT
CATH PALINDROME RT-P 15FX28CM (CATHETERS) IMPLANT
CATH PALINDROME RT-P 15FX55CM (CATHETERS) IMPLANT
CHLORAPREP W/TINT 26ML (MISCELLANEOUS) ×5 IMPLANT
CLIP VESOCCLUDE MED 6/CT (CLIP) ×5 IMPLANT
CLIP VESOCCLUDE SM WIDE 6/CT (CLIP) ×5 IMPLANT
COVER PROBE W GEL 5X96 (DRAPES) IMPLANT
COVER SURGICAL LIGHT HANDLE (MISCELLANEOUS) ×5 IMPLANT
COVER WAND RF STERILE (DRAPES) ×5 IMPLANT
DERMABOND ADVANCED (GAUZE/BANDAGES/DRESSINGS) ×4
DERMABOND ADVANCED .7 DNX12 (GAUZE/BANDAGES/DRESSINGS) ×3 IMPLANT
DRAPE C-ARM 42X72 X-RAY (DRAPES) ×5 IMPLANT
DRAPE CHEST BREAST 15X10 FENES (DRAPES) ×5 IMPLANT
ELECT REM PT RETURN 9FT ADLT (ELECTROSURGICAL) ×5
ELECTRODE REM PT RTRN 9FT ADLT (ELECTROSURGICAL) ×3 IMPLANT
GAUZE 4X4 16PLY RFD (DISPOSABLE) ×5 IMPLANT
GAUZE SPONGE 4X4 16PLY XRAY LF (GAUZE/BANDAGES/DRESSINGS) ×2 IMPLANT
GLOVE BIO SURGEON STRL SZ7.5 (GLOVE) ×5 IMPLANT
GLOVE BIOGEL M 6.5 STRL (GLOVE) ×6 IMPLANT
GLOVE BIOGEL PI IND STRL 8 (GLOVE) ×3 IMPLANT
GLOVE BIOGEL PI INDICATOR 8 (GLOVE) ×2
GLOVE SURG SS PI 6.5 STRL IVOR (GLOVE) ×2 IMPLANT
GLOVE SURG SS PI 7.0 STRL IVOR (GLOVE) ×2 IMPLANT
GOWN STRL NON-REIN LRG LVL3 (GOWN DISPOSABLE) ×2 IMPLANT
GOWN STRL REUS W/ TWL LRG LVL3 (GOWN DISPOSABLE) ×9 IMPLANT
GOWN STRL REUS W/TWL LRG LVL3 (GOWN DISPOSABLE) ×6
GUIDEWIRE ANGLED .035X150CM (WIRE) ×2 IMPLANT
KIT BASIN OR (CUSTOM PROCEDURE TRAY) ×5 IMPLANT
KIT ENCORE 26 ADVANTAGE (KITS) ×2 IMPLANT
KIT TURNOVER KIT B (KITS) ×5 IMPLANT
NDL 18GX1X1/2 (RX/OR ONLY) (NEEDLE) ×3 IMPLANT
NDL HYPO 25GX1X1/2 BEV (NEEDLE) ×3 IMPLANT
NEEDLE 18GX1X1/2 (RX/OR ONLY) (NEEDLE) ×5 IMPLANT
NEEDLE HYPO 25GX1X1/2 BEV (NEEDLE) ×5 IMPLANT
NS IRRIG 1000ML POUR BTL (IV SOLUTION) ×5 IMPLANT
PACK CV ACCESS (CUSTOM PROCEDURE TRAY) ×5 IMPLANT
PACK SURGICAL SETUP 50X90 (CUSTOM PROCEDURE TRAY) ×5 IMPLANT
PAD ARMBOARD 7.5X6 YLW CONV (MISCELLANEOUS) ×10 IMPLANT
SPONGE SURGIFOAM ABS GEL 100 (HEMOSTASIS) IMPLANT
SUT ETHILON 3 0 PS 1 (SUTURE) ×5 IMPLANT
SUT PROLENE 6 0 BV (SUTURE) ×7 IMPLANT
SUT VIC AB 3-0 SH 27 (SUTURE) ×2
SUT VIC AB 3-0 SH 27X BRD (SUTURE) ×3 IMPLANT
SUT VICRYL 4-0 PS2 18IN ABS (SUTURE) ×5 IMPLANT
SYR 10ML LL (SYRINGE) ×5 IMPLANT
SYR 20CC LL (SYRINGE) ×12 IMPLANT
SYR 5ML LL (SYRINGE) ×10 IMPLANT
SYR CONTROL 10ML LL (SYRINGE) ×5 IMPLANT
TOWEL GREEN STERILE (TOWEL DISPOSABLE) ×10 IMPLANT
TOWEL GREEN STERILE FF (TOWEL DISPOSABLE) ×5 IMPLANT
UNDERPAD 30X30 (UNDERPADS AND DIAPERS) ×5 IMPLANT
WATER STERILE IRR 1000ML POUR (IV SOLUTION) ×5 IMPLANT
WIRE BENTSON .035X145CM (WIRE) ×2 IMPLANT

## 2018-09-13 NOTE — Progress Notes (Signed)
PROGRESS NOTE    [name]  VPX:[id number] DOB: 26-Jan-1961 DOA: 09/09/2018 PCP: System, Pcp Not In    Brief Narrative:  57 y.o.malewith medical history significant of advanced CKD, left below-knee amputation, right transmetatarsal foot amputation, hypertension, diabetes, peripheral neuropathy, hyperlipidemia, tobacco abusenow in remission.  Presented with1 week history of right foot pressure worse in the lateral aspect of her right foot and purulent drainage he noticed skin breakdown of the foot about 2 weeks ago. Patient reports he has decreased sensation in his lower extremities and unable to feel pain.  Cr greater than 6. Still makes urine Last time had seen nephrology was 6 m ago. Had Left AC fistula placed in October 2018.  09/10/2018: Patient seen and examined at his bedside.  Reports significant pain in his right stump.  Pain medication in place as needed.  Orthopedic surgery consulted.  ABI done which revealed moderate peripheral artery disease.  Vascular surgery consulted for possible angiogram and intervention.  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)   Unilateral complete BKA, left, sequela (HCC)   Chronic kidney disease (CKD), stage IV (severe) (HCC)   Type 2 diabetes mellitus with peripheral neuropathy (HCC)   Hyperlipidemia   Diabetic foot ulcer (HCC)   Hypocalcemia   Dehydration  Right foot ulcer -Positive ABI with moderate disease -Positive for MRSA screening -Elevated sed rate and CRP noted -R foot MRI done, findings of tiny presumed soft tissue abscess  -Vascular surgery following, s/p aortogram of LE and fistuologram UE -Pt underwent revision of L arm AV fistula tunneled cath 10/11 with revascularization of RLE planned next week  -Continued on empiric on IV Rocephin and IV Flagyl -Vanc was added due to positive MRSA  screening -Blood cultures noted to be negative thus far  Moderate peripheral artery disease -Continue aspirin and Lipitor -Vascular surgery consulted and pt s/p fistulogram and aortogram -s/p AV fistula revision, plan for revascularization of RLE next week  Severe anion gap metabolic acidosis most likely secondary to renal failure versus others -Now on HD, clip in progress -Continue with HD as tolerated  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 -Nephrology consulted and following -s/p fistuogram, pt is now s/p revison of L arm AV fistula 10/11 -Patient tolerated HD -CLIP in progress  Severe hypomagnesemia -Mg was replaced, labs reviewed  Hyperphosphatemia -Recheck bmet in AM -Currently stable  Hypertension -BP stable at present, albeit suboptimally controlled -Continue with amlodipine, carvedilol as patient tolerates  DVT prophylaxis: Heparin subq Code Status: Full Family Communication: Pt in room, family not at bedside Disposition Plan: Uncertain at this time  Consultants:   Vascular surgery  Orthopedic surgery  Nephrology  Procedures:   Rocephin and IV Flagyl  Antimicrobials: Anti-infectives (From admission, onward)   Start     Dose/Rate Route Frequency Ordered Stop   09/13/18 1500  [MAR Hold]  ceFAZolin (ANCEF) IVPB 2g/100 mL premix     (MAR Hold since Fri 09/13/2018 at 1117. Reason: Transfer to a Procedural area.)   2 g 200 mL/hr over 30 Minutes Intravenous To ShortStay Procedural 09/12/18 1516 09/13/18 1335   09/12/18 1426  [MAR Hold]  vancomycin variable dose per unstable renal function (pharmacist dosing)     (MAR Hold since Fri 09/13/2018 at 1117. Reason: Transfer to a Procedural area.)    Does not apply See admin instructions 09/12/18 1426  09/10/18 0030  [MAR Hold]  cefTRIAXone (ROCEPHIN) 2 g in sodium chloride 0.9 % 100 mL IVPB     (MAR Hold since Fri 09/13/2018 at 1117. Reason: Transfer to a Procedural area.)   2 g 200 mL/hr over 30 Minutes Intravenous Daily at  bedtime 09/09/18 2344     09/10/18 0000  [MAR Hold]  metroNIDAZOLE (FLAGYL) IVPB 500 mg     (MAR Hold since Fri 09/13/2018 at 1117. Reason: Transfer to a Procedural area.)   500 mg 100 mL/hr over 60 Minutes Intravenous Every 8 hours 09/09/18 2344     09/09/18 2115  piperacillin-tazobactam (ZOSYN) IVPB 3.375 g  Status:  Discontinued     3.375 g 100 mL/hr over 30 Minutes Intravenous  Once 09/09/18 2100 09/09/18 2108   09/09/18 2115  piperacillin-tazobactam (ZOSYN) IVPB 2.25 g     2.25 g 100 mL/hr over 30 Minutes Intravenous  Once 09/09/18 2108 09/09/18 2207   09/09/18 2115  vancomycin (VANCOCIN) 2,000 mg in sodium chloride 0.9 % 500 mL IVPB     2,000 mg 250 mL/hr over 120 Minutes Intravenous  Once 09/09/18 2109 09/09/18 2334      Subjective: No complaints this AM  Objective: Vitals:   09/12/18 1217 09/12/18 1222 09/12/18 1950 09/13/18 1540  BP: (!) 158/70 (!) 158/70 (!) 148/75   Pulse:  75 86   Resp:  11 (!) 9   Temp:  98.3 F (36.8 C) 99 F (37.2 C) 97.9 F (36.6 C)  TempSrc:  Oral Oral   SpO2:  98% 96%   Weight:      Height:        Intake/Output Summary (Last 24 hours) at 09/13/2018 1556 Last data filed at 09/13/2018 1519 Gross per 24 hour  Intake 1439.89 ml  Output 471 ml  Net 968.89 ml   Filed Weights   09/10/18 1848 09/10/18 2206 09/11/18 2105  Weight: 99.3 kg 99.3 kg 99.3 kg    Examination: General exam: Conversant, in no acute distress Respiratory system: normal chest rise, clear, no audible wheezing Cardiovascular system: regular rhythm, s1-s2 Gastrointestinal system: Nondistended, nontender, pos BS Central nervous system: No seizures, no tremors Extremities: No cyanosis, no joint deformities, s/p [name] amputation Skin: No rashes, no pallor Psychiatry: Affect normal // no auditory hallucinations   Data Reviewed: I have personally reviewed following labs and imaging studies  CBC: Recent Labs  Lab 09/09/18 1951 09/10/18 0034 09/11/18 0455  09/12/18 0525  WBC 14.8* 13.2* 10.2 11.7*  NEUTROABS 12.3* 10.2*  --   --   HGB 8.8* 8.9* 8.1* 8.1*  HCT 27.7* 29.4* 26.1* 26.9*  MCV 72.1* 75.6* 73.3* 74.7*  PLT 295 281 235 035   Basic Metabolic Panel: Recent Labs  Lab 09/09/18 1951 09/10/18 0034 09/11/18 0455 09/12/18 0525  NA 140 139 139 139  K 3.9 3.9 2.9* 3.5  CL 111 114* 105 103  CO2 11* 11* 21* 21*  GLUCOSE 160* 153* 144* 155*  BUN 118* 120* 81* 87*  CREATININE 9.37* 9.09* 6.88* 7.84*  CALCIUM 6.2* 6.5* 6.3* 6.7*  MG 1.2* 1.1* 1.4* 2.6*  PHOS 7.6* 7.6* 5.5*  --    GFR: Estimated Creatinine Clearance: 12.5 mL/min (A) (by C-G formula based on SCr of 7.84 mg/dL (H)). Liver Function Tests: Recent Labs  Lab 09/09/18 1951 09/10/18 0034 09/10/18 1930  AST 20 18  --   ALT 30 26 24   ALKPHOS 66 55  --   BILITOT 0.4 0.6  --   PROT 8.3* 7.7  --  ALBUMIN 3.2* 2.9*  --    No results for input(s): LIPASE, AMYLASE in the last 168 hours. No results for input(s): AMMONIA in the last 168 hours. Coagulation Profile: No results for input(s): INR, PROTIME in the last 168 hours. Cardiac Enzymes: No results for input(s): CKTOTAL, CKMB, CKMBINDEX, TROPONINI in the last 168 hours. BNP (last 3 results) No results for input(s): PROBNP in the last 8760 hours. HbA1C: No results for input(s): HGBA1C in the last 72 hours. CBG: Recent Labs  Lab 09/13/18 0027 09/13/18 0440 09/13/18 0805 09/13/18 1056 09/13/18 1540  GLUCAP 168* 150* 138* 128* 133*   Lipid Profile: No results for input(s): CHOL, HDL, LDLCALC, TRIG, CHOLHDL, LDLDIRECT in the last 72 hours. Thyroid Function Tests: No results for input(s): TSH, T4TOTAL, FREET4, T3FREE, THYROIDAB in the last 72 hours. Anemia Panel: No results for input(s): VITAMINB12, FOLATE, FERRITIN, TIBC, IRON, RETICCTPCT in the last 72 hours. Sepsis Labs: Recent Labs  Lab 09/09/18 2000  LATICACIDVEN 0.56    Recent Results (from the past 240 hour(s))  Blood Cultures x 2 sites      Status: None (Preliminary result)   Collection Time: 09/10/18 12:30 AM  Result Value Ref Range Status   Specimen Description BLOOD LEFT ARM  Final   Special Requests   Final    BOTTLES DRAWN AEROBIC AND ANAEROBIC Blood Culture adequate volume   Culture   Final    NO GROWTH 3 DAYS Performed at [facility], [address], [place], [place] [address]    Report Status PENDING  Incomplete  Blood Cultures x 2 sites     Status: None (Preliminary result)   Collection Time: 09/10/18 12:34 AM  Result Value Ref Range Status   Specimen Description BLOOD LEFT HAND  Final   Special Requests   Final    BOTTLES DRAWN AEROBIC ONLY Blood Culture adequate volume   Culture   Final    NO GROWTH 3 DAYS Performed at [facility], [place] [address], [place], [place] [address]    Report Status PENDING  Incomplete  MRSA PCR Screening     Status: Abnormal   Collection Time: 09/10/18  8:51 AM  Result Value Ref Range Status   MRSA by PCR POSITIVE (A) NEGATIVE Final    Comment:        The GeneXpert MRSA Assay (FDA approved for NASAL specimens only), is one component of a comprehensive MRSA colonization surveillance program. It is not intended to diagnose MRSA infection nor to guide or monitor treatment for MRSA infections. RESULT CALLED TO, READ BACK BY AND VERIFIED WITH: RN Chapman Moss [id number] MLM Performed at [facility] [address] [place], [place] [address]      Radiology Studies: Dg Fluoro Guide Cv Line-no Report  Result Date: 09/13/2018 Fluoroscopy was utilized by the requesting physician.  No radiographic interpretation.    Scheduled Meds: . [MAR Hold] amLODipine  10 mg Oral QHS  . [MAR Hold] aspirin EC  81 mg Oral Daily  . [MAR Hold] atorvastatin  10 mg Oral QHS  . [MAR Hold] carvedilol  25 mg Oral BID WC  . [MAR Hold] Chlorhexidine Gluconate Cloth  6 each Topical Q0600  . [MAR Hold] darbepoetin (ARANESP) injection - DIALYSIS  60 mcg Intravenous Q Thu-HD  . [MAR  Hold] docusate sodium  100 mg Oral BID  . [MAR Hold] feeding supplement (NEPRO CARB STEADY)  237 mL Oral BID BM  . [MAR Hold] gabapentin  300  mg Oral QHS  . [MAR Hold] insulin aspart  0-9 Units Subcutaneous Q4H  . [MAR Hold] insulin glargine  10 Units Subcutaneous BID  . [MAR Hold] lactulose  20 g Oral NOW  . [MAR Hold] multivitamin  1 tablet Oral QHS  . [MAR Hold] mupirocin ointment   Nasal BID  . [MAR Hold] pantoprazole  40 mg Oral Daily  . [MAR Hold] polyethylene glycol  17 g Oral Daily  . [MAR Hold] sodium chloride flush  3 mL Intravenous Q12H  . [MAR Hold] vancomycin variable dose per unstable renal function (pharmacist dosing)   Does not apply See admin instructions   Continuous Infusions: . [MAR Hold] sodium chloride    . [MAR Hold] sodium chloride    . sodium chloride 10 mL/hr at 09/12/18 0649  . [MAR Hold] sodium chloride    . [MAR Hold] cefTRIAXone (ROCEPHIN)  IV 2 g (09/12/18 2157)   And  . [MAR Hold] metronidazole Stopped (09/13/18 0957)  . [MAR Hold] famotidine (PEPCID) IV       LOS: 4 days   Marylu Lund, MD [facility] Pager On Amion  If 7PM-7AM, please contact night-coverage 09/13/2018, 3:56 PM

## 2018-09-13 NOTE — Anesthesia Procedure Notes (Signed)
Procedure Name: MAC Date/Time: 09/13/2018 1:28 PM Performed by: Candis Shine, CRNA Pre-anesthesia Checklist: Patient identified, Emergency Drugs available, Suction available, Patient being monitored and Timeout performed Patient Re-evaluated:Patient Re-evaluated prior to induction Oxygen Delivery Method: Simple face mask Dental Injury: Teeth and Oropharynx as per pre-operative assessment

## 2018-09-13 NOTE — Op Note (Signed)
    [name]    MRN: [id number] DOB: [birth year]    DATE OF OPERATION: 09/13/2018  PREOP DIAGNOSIS:    Poorly functioning left brachiocephalic AV fistula  POSTOP DIAGNOSIS:    Same  PROCEDURE:    Ultrasound-guided placement of right IJ 23 cm tunneled dialysis catheter Ligation of 3 competing branches of left brachiocephalic fistula Angioplasty of stenosis of left brachiocephalic fistula (5 mm x 4 cm balloon)  SURGEON: Judeth Cornfield. Scot Dock, MD, FACS  ASSIST: Izetta Dakin, RNFA  ANESTHESIA: local with sedation   EBL: min  INDICATIONS:    [name] is a 57 y.o. male with a poorly functioning left brachiocephalic AV fistula  FINDINGS:   Excellent thrill and brachiocephalic fistula at the completion of the procedure.  TECHNIQUE:   The patient was taken to the operating room and sedated by anesthesia.  The neck and upper chest were prepped and draped in usual sterile fashion.  Under ultrasound guidance after the skin was anesthetized the right IJ was cannulated and a guidewire introduced into the right atrium under fluoroscopic control.  The tract of the wire was dilated and then a dilator and peel-away sheath were advanced over the wire and the wire and dilator removed.  The catheter was passed through the peel-away sheath and positioned in the right atrium.  The exit site for the catheter was selected and the skin anesthetized between the 2 areas the catheter was brought through the tunnel cut the appropriate length and the distal ports were attached.  Both ports withdrew easily and then were filled with heparinized saline.  Sterile dressing was applied.  The catheter was secured at its exit site with a 3-0 nylon suture.  The IJ cannulation site was closed with a 4-0 subcuticular stitch.  Attention was then turned to the left arm.  The left arm was prepped and draped in usual sterile fashion.  After the skin was anesthetized with 1% lidocaine a longitudinal  incision was made over the area where there were 3 large branches.  These were identified and 2 of these were ligated.  The larger one was dissected free and then ligated distally allowing access for placement of a balloon catheter.  There was an area of intimal hyperplasia proximal to the most proximal branch that was ligated.  The balloon would not pass by itself.  I therefore used an angled Glidewire to advance the wire through the stenosis.  I then positioned a 5 mm x 4 cm balloon in the area of stenosis and inflated this to 12 atm.  This was repeated further distally.  At the completion was an excellent thrill in the fistula.  The branch was then ligated with a 2-0 silk tie.  Hemostasis was obtained in the wound.  The wound was then closed with a deep layer of 3-0 Vicryl and the skin closed with 4-0 Vicryl.  Dermabond was applied.  Patient tolerated the procedure well was transferred to the recovery room in stable condition.  All needle sponge counts were correct.  [name], MD, FACS [facility]  DATE OF DICTATION:   09/13/2018

## 2018-09-13 NOTE — Transfer of Care (Signed)
Immediate Anesthesia Transfer of Care Note  Patient: [name]  Procedure(s) Performed: REVISON OF ARTERIOVENOUS FISTULA ARM (Left ) INSERTION OF 23cm DIALYSIS CATHETER (Right ) BALLOON ANGIOPLASTY OF LEFT ARM (Left Arm Upper)  Patient Location: PACU  Anesthesia Type:MAC  Level of Consciousness: drowsy  Airway & Oxygen Therapy: Patient Spontanous Breathing and Patient connected to face mask oxygen  Post-op Assessment: Report given to RN and Post -op Vital signs reviewed and stable  Post vital signs: Reviewed and stable  Last Vitals:  Vitals Value Taken Time  BP 111/67 09/13/2018  3:41 PM  Temp    Pulse 64 09/13/2018  3:44 PM  Resp 9 09/13/2018  3:44 PM  SpO2 94 % 09/13/2018  3:44 PM  Vitals shown include unvalidated device data.  Last Pain:  Vitals:   09/13/18 0800  TempSrc:   PainSc: 0-No pain      Patients Stated Pain Goal: 5 (70/35/00 9381)  Complications: No apparent anesthesia complications

## 2018-09-13 NOTE — Progress Notes (Signed)
PT Cancellation Note  Patient Details Name: [name] MRN: [id number] DOB: Sep 14, 1961   Cancelled Treatment:    Reason Eval/Treat Not Completed: Patient at procedure or test/unavailable   Ellamae Sia, PT, DPT Acute Rehabilitation Services Pager [phone] Office [phone]    [name] 09/13/2018, 1:01 PM

## 2018-09-13 NOTE — Anesthesia Preprocedure Evaluation (Addendum)
Anesthesia Evaluation  Patient identified by MRN, date of birth, ID band Patient awake    Reviewed: Allergy & Precautions, NPO status , Patient's Chart, lab work & pertinent test results  Airway Mallampati: II  TM Distance: >3 FB Neck ROM: Full    Dental no notable dental hx.    Pulmonary neg pulmonary ROS, former smoker,    Pulmonary exam normal breath sounds clear to auscultation       Cardiovascular hypertension, negative cardio ROS Normal cardiovascular exam Rhythm:Regular Rate:Normal     Neuro/Psych negative neurological ROS  negative psych ROS   GI/Hepatic negative GI ROS, Neg liver ROS,   Endo/Other  negative endocrine ROSdiabetes  Renal/GU negative Renal ROS  negative genitourinary   Musculoskeletal negative musculoskeletal ROS (+)   Abdominal   Peds negative pediatric ROS (+)  Hematology negative hematology ROS (+)   Anesthesia Other Findings   Reproductive/Obstetrics negative OB ROS                                                             Anesthesia Evaluation  Patient identified by MRN, date of birth, ID band Patient awake    Reviewed: Allergy & Precautions, NPO status , Patient's Chart, lab work & pertinent test results  History of Anesthesia Complications Negative for: history of anesthetic complications  Airway Mallampati: II  TM Distance: >3 FB Neck ROM: Full    Dental  (+) Dental Advisory Given   Pulmonary former smoker (quit 2013),    breath sounds clear to auscultation       Cardiovascular hypertension, Pt. on medications and Pt. on home beta blockers (-) angina+ Peripheral Vascular Disease   Rhythm:Regular Rate:Normal     Neuro/Psych negative neurological ROS     GI/Hepatic Neg liver ROS, GERD  Controlled,  Endo/Other  diabetes (glu 132), Insulin Dependent  Renal/GU ESRFRenal disease (no dialysis yet, K+ 4.9)      Musculoskeletal   Abdominal   Peds  Hematology negative hematology ROS (+)   Anesthesia Other Findings   Reproductive/Obstetrics                            Anesthesia Physical Anesthesia Plan  ASA: III  Anesthesia Plan: MAC   Post-op Pain Management:    Induction:   PONV Risk Score and Plan: 1 and Ondansetron and Treatment may vary due to age or medical condition  Airway Management Planned: Natural Airway and Simple Face Mask  Additional Equipment:   Intra-op Plan:   Post-operative Plan:   Informed Consent: I have reviewed the patients History and Physical, chart, labs and discussed the procedure including the risks, benefits and alternatives for the proposed anesthesia with the patient or authorized representative who has indicated his/her understanding and acceptance.   Dental advisory given  Plan Discussed with: CRNA and Surgeon  Anesthesia Plan Comments: (Plan routine monitors, MAC)        Anesthesia Quick Evaluation  Anesthesia Physical Anesthesia Plan  ASA: IV  Anesthesia Plan: MAC   Post-op Pain Management:    Induction: Intravenous  PONV Risk Score and Plan: 1 and Ondansetron  Airway Management Planned: Simple Face Mask  Additional Equipment:   Intra-op Plan:   Post-operative Plan:   Informed Consent: I have reviewed  the patients History and Physical, chart, labs and discussed the procedure including the risks, benefits and alternatives for the proposed anesthesia with the patient or authorized representative who has indicated his/her understanding and acceptance.   Dental advisory given  Plan Discussed with: CRNA  Anesthesia Plan Comments:         Anesthesia Quick Evaluation

## 2018-09-13 NOTE — Interval H&P Note (Signed)
History and Physical Interval Note:  09/13/2018 1:10 PM  [name]  has presented today for surgery, with the diagnosis of end stage renal disease  The various methods of treatment have been discussed with the patient and family. After consideration of risks, benefits and other options for treatment, the patient has consented to  Procedure(s): REVISON OF ARTERIOVENOUS FISTULA ARM (Left) INSERTION OF DIALYSIS CATHETER (N/A) as a surgical intervention .  The patient's history has been reviewed, patient examined, no change in status, stable for surgery.  I have reviewed the patient's chart and labs.  Questions were answered to the patient's satisfaction.     [name]

## 2018-09-13 NOTE — Progress Notes (Signed)
Admit: 09/09/2018 LOS: 4  39M new ESRD, R Foot ulcer  Subjective:   . Tunneled dialysis catheter and ligation of competing branches of fistula with angioplasty of stenosis completed earlier today . For hemodialysis treatment #2 later today . Will need intervention on lower extremity PAD next week . CLIP in process . In PACU  10/10 0701 - 10/11 0700 In: 822 [I.V.:4.6; IV Piggyback:817.4] Out: 451 [Urine:450; Stool:1]  Filed Weights   09/10/18 1848 09/10/18 2206 09/11/18 2105  Weight: 99.3 kg 99.3 kg 99.3 kg    Scheduled Meds: . [MAR Hold] amLODipine  10 mg Oral QHS  . [MAR Hold] aspirin EC  81 mg Oral Daily  . [MAR Hold] atorvastatin  10 mg Oral QHS  . [MAR Hold] carvedilol  25 mg Oral BID WC  . [MAR Hold] Chlorhexidine Gluconate Cloth  6 each Topical Q0600  . [MAR Hold] darbepoetin (ARANESP) injection - DIALYSIS  60 mcg Intravenous Q Thu-HD  . [MAR Hold] docusate sodium  100 mg Oral BID  . [MAR Hold] feeding supplement (NEPRO CARB STEADY)  237 mL Oral BID BM  . [MAR Hold] gabapentin  300 mg Oral QHS  . [MAR Hold] insulin aspart  0-9 Units Subcutaneous Q4H  . [MAR Hold] insulin glargine  10 Units Subcutaneous BID  . [MAR Hold] lactulose  20 g Oral NOW  . [MAR Hold] multivitamin  1 tablet Oral QHS  . [MAR Hold] mupirocin ointment   Nasal BID  . [MAR Hold] pantoprazole  40 mg Oral Daily  . [MAR Hold] polyethylene glycol  17 g Oral Daily  . [MAR Hold] sodium chloride flush  3 mL Intravenous Q12H  . [MAR Hold] vancomycin variable dose per unstable renal function (pharmacist dosing)   Does not apply See admin instructions   Continuous Infusions: . [MAR Hold] sodium chloride    . [MAR Hold] sodium chloride    . sodium chloride 10 mL/hr at 09/12/18 0649  . [MAR Hold] sodium chloride    . [MAR Hold] cefTRIAXone (ROCEPHIN)  IV 2 g (09/12/18 2157)   And  . [MAR Hold] metronidazole Stopped (09/13/18 0957)  . [MAR Hold] famotidine (PEPCID) IV     PRN Meds:.[MAR Hold] sodium  chloride, [MAR Hold] sodium chloride, [MAR Hold] sodium chloride, [MAR Hold] acetaminophen **OR** [MAR Hold] acetaminophen, [MAR Hold] alteplase, [MAR Hold] heparin, [MAR Hold] hydrALAZINE, [MAR Hold] HYDROcodone-acetaminophen, HYDROmorphone (DILAUDID) injection, [MAR Hold] labetalol, [MAR Hold] lidocaine (PF), [MAR Hold] lidocaine-prilocaine, [MAR Hold] ondansetron **OR** [MAR Hold] ondansetron (ZOFRAN) IV, [MAR Hold] oxyCODONE, oxyCODONE **OR** oxyCODONE, [MAR Hold] pentafluoroprop-tetrafluoroeth, promethazine, [MAR Hold] sodium chloride flush  Current Labs: reviewed    Ref. Range 09/10/2018 05:26  Saturation Ratios Latest Ref Range: 17.9 - 39.5 % 24  Ferritin Latest Ref Range: 24 - 336 ng/mL 735 (H)    Ref. Range 09/10/2018 05:26  PTH, Intact Latest Ref Range: 15 - 65 pg/mL 275 (H)    Physical Exam:  Blood pressure 108/63, pulse 64, temperature 97.9 F (36.6 C), resp. rate 10, height 6\' 4"  (1.93 m), weight 99.3 kg, SpO2 92 %. NAD LUE AVF +B/T, deep R Foto ulcer bandaged RRR nl s1s2 CTAB diminished in bases No rashes/lesions S/nt/nd AAO x3, no asterixus L BKA, R TMA present  A 1. New ESRD 1. LUE AVF immature: s/p ligation of collaterals and PTA 09/12/18 2. S/p [facility] 09/13/18 3. CLIP in process 4. HD#1 started 09/10/18 2. R TMA Foot ulcer, VVS/Ortho following; intervention planned 10/14 3. CKD BMD: PTH acceptable currently.  P 5.5 hold binders, follow 4. Anemia; Fe indices above.  Needs ESA, start Aranesp 10/12 5. DM2 6. HTN 7. ASCVD/PAD  P . HD #2 today: 3K, 3h, TDC, No heparin, 1L UF . Medication Issues; o Preferred narcotic agents for pain control are hydromorphone, fentanyl, and methadone. Morphine should not be used.  o Baclofen should be avoided o Avoid oral sodium phosphate and magnesium citrate based laxatives / bowel preps    Pearson Grippe MD 09/13/2018, 4:09 PM  Recent Labs  Lab 09/09/18 1951 09/10/18 0034 09/11/18 0455 09/12/18 0525  NA 140 139 139 139  K  3.9 3.9 2.9* 3.5  CL 111 114* 105 103  CO2 11* 11* 21* 21*  GLUCOSE 160* 153* 144* 155*  BUN 118* 120* 81* 87*  CREATININE 9.37* 9.09* 6.88* 7.84*  CALCIUM 6.2* 6.5* 6.3* 6.7*  PHOS 7.6* 7.6* 5.5*  --    Recent Labs  Lab 09/09/18 1951 09/10/18 0034 09/11/18 0455 09/12/18 0525  WBC 14.8* 13.2* 10.2 11.7*  NEUTROABS 12.3* 10.2*  --   --   HGB 8.8* 8.9* 8.1* 8.1*  HCT 27.7* 29.4* 26.1* 26.9*  MCV 72.1* 75.6* 73.3* 74.7*  PLT 295 281 235 250

## 2018-09-13 NOTE — Clinical Social Work Note (Signed)
CSW acknowledges consult "Access meds for discharge." Please consult RNCM.  CSW signing off. Consult again if any social work needs arise.  Dayton Scrape, [place]

## 2018-09-13 NOTE — Progress Notes (Signed)
  Progress Note    09/13/2018 8:03 AM 1 Day Post-Op  Subjective: No acute issues  Vitals:   09/12/18 1222 09/12/18 1950  BP: (!) 158/70 (!) 148/75  Pulse: 75 86  Resp: 11 (!) 9  Temp: 98.3 F (36.8 C) 99 F (37.2 C)  SpO2: 98% 96%    Physical Exam: aaox3 Palpable pulsatility in left upper arm Left common femoral artery access site is without hematoma    CBC    Component Value Date/Time   WBC 11.7 (H) 09/12/2018 0525   RBC 3.60 (L) 09/12/2018 0525   HGB 8.1 (L) 09/12/2018 0525   HCT 26.9 (L) 09/12/2018 0525   PLT 250 09/12/2018 0525   MCV 74.7 (L) 09/12/2018 0525   MCH 22.5 (L) 09/12/2018 0525   MCHC 30.1 09/12/2018 0525   RDW 15.6 (H) 09/12/2018 0525   LYMPHSABS 1.3 09/10/2018 0034   MONOABS 1.5 (H) 09/10/2018 0034   EOSABS 0.1 09/10/2018 0034   BASOSABS 0.1 09/10/2018 0034    BMET    Component Value Date/Time   NA 139 09/12/2018 0525   K 3.5 09/12/2018 0525   CL 103 09/12/2018 0525   CO2 21 (L) 09/12/2018 0525   GLUCOSE 155 (H) 09/12/2018 0525   BUN 87 (H) 09/12/2018 0525   CREATININE 7.84 (H) 09/12/2018 0525   CALCIUM 6.7 (L) 09/12/2018 0525   GFRNONAA 7 (L) 09/12/2018 0525   GFRAA 8 (L) 09/12/2018 0525    INR    Component Value Date/Time   INR 1.17 03/31/2017 0004     Intake/Output Summary (Last 24 hours) at 09/13/2018 0803 Last data filed at 09/12/2018 2349 Gross per 24 hour  Intake 821.95 ml  Output 451 ml  Net 370.95 ml     Assessment/plan:  57 y.o. male is now with end-stage renal disease will need permanent access for dialysis and will plan for retrograde tibial intervention on Monday.  Today he will need tunneled dialysis catheter in the operating room and revision of his left upper arm AV fistula which will require branch ligation possible superficialization.   Zaylah Blecha C. Donzetta Matters, MD [facility] [place] Office: [phone] Pager: [phone]  09/13/2018 8:03 AM

## 2018-09-13 NOTE — H&P (View-Only) (Signed)
  Progress Note    09/13/2018 8:03 AM 1 Day Post-Op  Subjective: No acute issues  Vitals:   09/12/18 1222 09/12/18 1950  BP: (!) 158/70 (!) 148/75  Pulse: 75 86  Resp: 11 (!) 9  Temp: 98.3 F (36.8 C) 99 F (37.2 C)  SpO2: 98% 96%    Physical Exam: aaox3 Palpable pulsatility in left upper arm Left common femoral artery access site is without hematoma    CBC    Component Value Date/Time   WBC 11.7 (H) 09/12/2018 0525   RBC 3.60 (L) 09/12/2018 0525   HGB 8.1 (L) 09/12/2018 0525   HCT 26.9 (L) 09/12/2018 0525   PLT 250 09/12/2018 0525   MCV 74.7 (L) 09/12/2018 0525   MCH 22.5 (L) 09/12/2018 0525   MCHC 30.1 09/12/2018 0525   RDW 15.6 (H) 09/12/2018 0525   LYMPHSABS 1.3 09/10/2018 0034   MONOABS 1.5 (H) 09/10/2018 0034   EOSABS 0.1 09/10/2018 0034   BASOSABS 0.1 09/10/2018 0034    BMET    Component Value Date/Time   NA 139 09/12/2018 0525   K 3.5 09/12/2018 0525   CL 103 09/12/2018 0525   CO2 21 (L) 09/12/2018 0525   GLUCOSE 155 (H) 09/12/2018 0525   BUN 87 (H) 09/12/2018 0525   CREATININE 7.84 (H) 09/12/2018 0525   CALCIUM 6.7 (L) 09/12/2018 0525   GFRNONAA 7 (L) 09/12/2018 0525   GFRAA 8 (L) 09/12/2018 0525    INR    Component Value Date/Time   INR 1.17 03/31/2017 0004     Intake/Output Summary (Last 24 hours) at 09/13/2018 0803 Last data filed at 09/12/2018 2349 Gross per 24 hour  Intake 821.95 ml  Output 451 ml  Net 370.95 ml     Assessment/plan:  57 y.o. male is now with end-stage renal disease will need permanent access for dialysis and will plan for retrograde tibial intervention on Monday.  Today he will need tunneled dialysis catheter in the operating room and revision of his left upper arm AV fistula which will require branch ligation possible superficialization.   Charlean Carneal C. Donzetta Matters, MD [facility] [place] Office: [phone] Pager: [phone]  09/13/2018 8:03 AM

## 2018-09-14 LAB — RENAL FUNCTION PANEL
Albumin: 2.3 g/dL — ABNORMAL LOW (ref 3.5–5.0)
Anion gap: 14 (ref 5–15)
BUN: 92 mg/dL — AB (ref 6–20)
CHLORIDE: 102 mmol/L (ref 98–111)
CO2: 19 mmol/L — AB (ref 22–32)
Calcium: 6.7 mg/dL — ABNORMAL LOW (ref 8.9–10.3)
Creatinine, Ser: 9.43 mg/dL — ABNORMAL HIGH (ref 0.61–1.24)
GFR calc Af Amer: 6 mL/min — ABNORMAL LOW (ref >60)
GFR, EST NON AFRICAN AMERICAN: 5 mL/min — AB (ref >60)
Glucose, Bld: 178 mg/dL — ABNORMAL HIGH (ref 70–99)
Phosphorus: 8 mg/dL — ABNORMAL HIGH (ref 2.5–4.6)
Potassium: 3.5 mmol/L (ref 3.5–5.1)
Sodium: 135 mmol/L (ref 135–145)

## 2018-09-14 LAB — VANCOMYCIN, RANDOM: Vancomycin Rm: 25

## 2018-09-14 LAB — GLUCOSE, CAPILLARY
GLUCOSE-CAPILLARY: 177 mg/dL — AB (ref 70–99)
Glucose-Capillary: 146 mg/dL — ABNORMAL HIGH (ref 70–99)
Glucose-Capillary: 234 mg/dL — ABNORMAL HIGH (ref 70–99)
Glucose-Capillary: 178 mg/dL — ABNORMAL HIGH (ref 70–99)

## 2018-09-14 LAB — CBC
HCT: 24.9 % — ABNORMAL LOW (ref 39.0–52.0)
Hemoglobin: 7.1 g/dL — ABNORMAL LOW (ref 13.0–17.0)
MCH: 22.1 pg — ABNORMAL LOW (ref 26.0–34.0)
MCHC: 28.5 g/dL — ABNORMAL LOW (ref 30.0–36.0)
MCV: 77.6 fL — AB (ref 80.0–100.0)
NRBC: 0 % (ref 0.0–0.2)
PLATELETS: 203 10*3/uL (ref 150–400)
RBC: 3.21 MIL/uL — ABNORMAL LOW (ref 4.22–5.81)
RDW: 15.7 % — AB (ref 11.5–15.5)
WBC: 13.1 10*3/uL — ABNORMAL HIGH (ref 4.0–10.5)

## 2018-09-14 MED ORDER — OXYCODONE-ACETAMINOPHEN 5-325 MG PO TABS
ORAL_TABLET | ORAL | Status: AC
  Administered 2018-09-14: 1 via ORAL
  Filled 2018-09-14: qty 1

## 2018-09-14 MED ORDER — DARBEPOETIN ALFA 60 MCG/0.3ML IJ SOSY
PREFILLED_SYRINGE | INTRAMUSCULAR | Status: AC
  Filled 2018-09-14: qty 0.3

## 2018-09-14 MED ORDER — VANCOMYCIN HCL IN DEXTROSE 750-5 MG/150ML-% IV SOLN
750.0000 mg | Freq: Once | INTRAVENOUS | Status: AC
  Administered 2018-09-14: 750 mg via INTRAVENOUS
  Filled 2018-09-14: qty 150

## 2018-09-14 MED ORDER — VANCOMYCIN HCL IN DEXTROSE 1-5 GM/200ML-% IV SOLN
1000.0000 mg | Freq: Once | INTRAVENOUS | Status: AC
  Administered 2018-09-14: 1000 mg via INTRAVENOUS
  Filled 2018-09-14: qty 200

## 2018-09-14 MED ORDER — HEPARIN SODIUM (PORCINE) 1000 UNIT/ML IJ SOLN
INTRAMUSCULAR | Status: AC
  Administered 2018-09-14: 3200 [IU] via INTRAVENOUS_CENTRAL
  Filled 2018-09-14: qty 4

## 2018-09-14 MED ORDER — HEPARIN SODIUM (PORCINE) 1000 UNIT/ML IJ SOLN
INTRAMUSCULAR | Status: AC
  Administered 2018-09-14: 3400 [IU]
  Filled 2018-09-14: qty 4

## 2018-09-14 NOTE — Progress Notes (Signed)
PROGRESS NOTE    [name]  BOF:[id number] DOB: 05-21-1961 DOA: 09/09/2018 PCP: System, Pcp Not In    Brief Narrative:  57 y.o.malewith medical history significant of advanced CKD, left below-knee amputation, right transmetatarsal foot amputation, hypertension, diabetes, peripheral neuropathy, hyperlipidemia, tobacco abusenow in remission.  Presented with1 week history of right foot pressure worse in the lateral aspect of her right foot and purulent drainage he noticed skin breakdown of the foot about 2 weeks ago. Patient reports he has decreased sensation in his lower extremities and unable to feel pain.  Cr greater than 6. Still makes urine Last time had seen nephrology was 6 m ago. Had Left AC fistula placed in October 2018.  09/10/2018: Patient seen and examined at his bedside.  Reports significant pain in his right stump.  Pain medication in place as needed.  Orthopedic surgery consulted.  ABI done which revealed moderate peripheral artery disease.  Vascular surgery consulted for possible angiogram and intervention.  Assessment & Plan:   Active Problems:   Anemia of chronic disease   AKI (acute kidney injury) ([place])   Essential hypertension   DM type 2 causing CKD stage 4 (HCC)   Unilateral complete BKA, left, sequela (HCC)   Chronic kidney disease (CKD), stage IV (severe) (HCC)   Type 2 diabetes mellitus with peripheral neuropathy (HCC)   Hyperlipidemia   Diabetic foot ulcer (HCC)   Hypocalcemia   Dehydration  Right foot ulcer -Positive ABI with moderate disease -Positive for MRSA screening -Elevated sed rate and CRP noted -R foot MRI done, findings of tiny presumed soft tissue abscess  -Vascular surgery following, s/p aortogram of LE and fistuologram UE -Pt underwent revision of L arm AV fistula tunneled cath 10/11 with revascularization of RLE planned next week  -Continued on empiric on IV Rocephin and IV Flagyl -Vanc was initially added due to positive MRSA  screening -Blood cultures so far negative  Moderate peripheral artery disease -Continue aspirin and Lipitor -Vascular surgery consulted and pt s/p fistulogram and aortogram -s/p AV fistula revision, plan for revascularization of RLE next week  Severe anion gap metabolic acidosis most likely secondary to renal failure versus others -Now on HD, clip in progress -Continue with HD as tolerated  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 -Nephrology consulted and following -s/p fistuogram, pt is now s/p revison of L arm AV fistula 10/11 -Patient tolerated HD -CLIP in progress  Severe hypomagnesemia -Mg was replaced, labs reviewed  Hyperphosphatemia -Recheck bmet in AM -Currently stable  Hypertension -BP stable at present, albeit suboptimally controlled -Continue with amlodipine, carvedilol as patient tolerates  Constipation -Resolved with lactulose  DVT prophylaxis: Heparin subq Code Status: Full Family Communication: Pt in room, family not at bedside Disposition Plan: Uncertain at this time  Consultants:   Vascular surgery  Orthopedic surgery  Nephrology  Procedures:   Rocephin and IV Flagyl  Antimicrobials: Anti-infectives (From admission, onward)   Start     Dose/Rate Route Frequency Ordered Stop   09/14/18 0345  vancomycin (VANCOCIN) IVPB 1000 mg/200 mL premix     1,000 mg 200 mL/hr over 60 Minutes Intravenous  Once 09/14/18 0337 09/14/18 0613   09/13/18 1500  ceFAZolin (ANCEF) IVPB 2g/100 mL premix     2 g 200 mL/hr over 30 Minutes Intravenous To ShortStay Procedural 09/12/18 1516 09/13/18 1335   09/12/18 1426  vancomycin variable dose per unstable renal function (pharmacist dosing)  Status:  Discontinued      Does not apply See admin instructions 09/12/18 1426 09/13/18 1646  09/10/18 0030  cefTRIAXone (ROCEPHIN) 2 g in sodium chloride 0.9 % 100 mL IVPB     2 g 200 mL/hr over 30 Minutes Intravenous Daily at bedtime 09/09/18 2344     09/10/18 0000  metroNIDAZOLE (FLAGYL)  IVPB 500 mg     500 mg 100 mL/hr over 60 Minutes Intravenous Every 8 hours 09/09/18 2344     09/09/18 2115  piperacillin-tazobactam (ZOSYN) IVPB 3.375 g  Status:  Discontinued     3.375 g 100 mL/hr over 30 Minutes Intravenous  Once 09/09/18 2100 09/09/18 2108   09/09/18 2115  piperacillin-tazobactam (ZOSYN) IVPB 2.25 g     2.25 g 100 mL/hr over 30 Minutes Intravenous  Once 09/09/18 2108 09/09/18 2207   09/09/18 2115  vancomycin (VANCOCIN) 2,000 mg in sodium chloride 0.9 % 500 mL IVPB     2,000 mg 250 mL/hr over 120 Minutes Intravenous  Once 09/09/18 2109 09/09/18 2334      Subjective: Feels better after large BM yesterday  Objective: Vitals:   09/14/18 0200 09/14/18 0230 09/14/18 0305 09/14/18 0354  BP: (!) 152/73 (!) 150/70 (!) 152/78 (!) 151/78  Pulse: 77 77 76 85  Resp:   18 16  Temp:   98.2 F (36.8 C) 100.3 F (37.9 C)  TempSrc:   Oral Oral  SpO2:   98% 96%  Weight:   100 kg   Height:        Intake/Output Summary (Last 24 hours) at 09/14/2018 1552 Last data filed at 09/14/2018 0305 Gross per 24 hour  Intake -  Output 1000 ml  Net -1000 ml   Filed Weights   09/11/18 2105 09/13/18 2350 09/14/18 0305  Weight: 99.3 kg 101 kg 100 kg    Examination: General exam: Awake, laying in bed, in nad Respiratory system: Normal respiratory effort, no wheezing  Data Reviewed: I have personally reviewed following labs and imaging studies  CBC: Recent Labs  Lab 09/09/18 1951 09/10/18 0034 09/11/18 0455 09/12/18 0525 09/14/18 0019  WBC 14.8* 13.2* 10.2 11.7* 13.1*  NEUTROABS 12.3* 10.2*  --   --   --   HGB 8.8* 8.9* 8.1* 8.1* 7.1*  HCT 27.7* 29.4* 26.1* 26.9* 24.9*  MCV 72.1* 75.6* 73.3* 74.7* 77.6*  PLT 295 281 235 250 440   Basic Metabolic Panel: Recent Labs  Lab 09/09/18 1951 09/10/18 0034 09/11/18 0455 09/12/18 0525 09/14/18 0020  NA 140 139 139 139 135  K 3.9 3.9 2.9* 3.5 3.5  CL 111 114* 105 103 102  CO2 11* 11* 21* 21* 19*  GLUCOSE 160* 153* 144*  155* 178*  BUN 118* 120* 81* 87* 92*  CREATININE 9.37* 9.09* 6.88* 7.84* 9.43*  CALCIUM 6.2* 6.5* 6.3* 6.7* 6.7*  MG 1.2* 1.1* 1.4* 2.6*  --   PHOS 7.6* 7.6* 5.5*  --  8.0*   GFR: Estimated Creatinine Clearance: 10.4 mL/min (A) (by C-G formula based on SCr of 9.43 mg/dL (H)). Liver Function Tests: Recent Labs  Lab 09/09/18 1951 09/10/18 0034 09/10/18 1930 09/14/18 0020  AST 20 18  --   --   ALT 30 26 24   --   ALKPHOS 66 55  --   --   BILITOT 0.4 0.6  --   --   PROT 8.3* 7.7  --   --   ALBUMIN 3.2* 2.9*  --  2.3*   No results for input(s): LIPASE, AMYLASE in the last 168 hours. No results for input(s): AMMONIA in the last 168 hours. Coagulation Profile: No  results for input(s): INR, PROTIME in the last 168 hours. Cardiac Enzymes: No results for input(s): CKTOTAL, CKMB, CKMBINDEX, TROPONINI in the last 168 hours. BNP (last 3 results) No results for input(s): PROBNP in the last 8760 hours. HbA1C: No results for input(s): HGBA1C in the last 72 hours. CBG: Recent Labs  Lab 09/13/18 1642 09/13/18 2126 09/14/18 0341 09/14/18 0829 09/14/18 1223  GLUCAP 134* 202* 146* 234* 178*   Lipid Profile: No results for input(s): CHOL, HDL, LDLCALC, TRIG, CHOLHDL, LDLDIRECT in the last 72 hours. Thyroid Function Tests: No results for input(s): TSH, T4TOTAL, FREET4, T3FREE, THYROIDAB in the last 72 hours. Anemia Panel: No results for input(s): VITAMINB12, FOLATE, FERRITIN, TIBC, IRON, RETICCTPCT in the last 72 hours. Sepsis Labs: Recent Labs  Lab 09/09/18 2000  LATICACIDVEN 0.56    Recent Results (from the past 240 hour(s))  Blood Cultures x 2 sites     Status: None (Preliminary result)   Collection Time: 09/10/18 12:30 AM  Result Value Ref Range Status   Specimen Description BLOOD LEFT ARM  Final   Special Requests   Final    BOTTLES DRAWN AEROBIC AND ANAEROBIC Blood Culture adequate volume   Culture   Final    NO GROWTH 4 DAYS Performed at [facility], [address], [place], [place] [address]    Report Status PENDING  Incomplete  Blood Cultures x 2 sites     Status: None (Preliminary result)   Collection Time: 09/10/18 12:34 AM  Result Value Ref Range Status   Specimen Description BLOOD LEFT HAND  Final   Special Requests   Final    BOTTLES DRAWN AEROBIC ONLY Blood Culture adequate volume   Culture   Final    NO GROWTH 4 DAYS Performed at [facility], [place] [address] [place], [place] [address]    Report Status PENDING  Incomplete  MRSA PCR Screening     Status: Abnormal   Collection Time: 09/10/18  8:51 AM  Result Value Ref Range Status   MRSA by PCR POSITIVE (A) NEGATIVE Final    Comment:        The GeneXpert MRSA Assay (FDA approved for NASAL specimens only), is one component of a comprehensive MRSA colonization surveillance program. It is not intended to diagnose MRSA infection nor to guide or monitor treatment for MRSA infections. RESULT CALLED TO, READ BACK BY AND VERIFIED WITH: RN Chapman Moss [phone] MLM Performed at [facility] [address] [place], [place] [address]      Radiology Studies: Dg Chest Port 1 View  Result Date: 09/13/2018 CLINICAL DATA:  Dialysis catheter placement EXAM: PORTABLE CHEST 1 VIEW COMPARISON:  03/30/2017 FINDINGS: Right jugular dual lumen central venous catheter tip in the right atrium. No pneumothorax. Low lung volumes with bibasilar atelectasis.  No effusion or edema IMPRESSION: Dialysis catheter tip in the right atrium. Bibasilar atelectasis with low lung volumes. Electronically Signed   By: Franchot Gallo M.D.   On: 09/13/2018 16:49   Dg Fluoro Guide Cv Line-no Report  Result Date: 09/13/2018 Fluoroscopy was utilized by the requesting physician.  No radiographic interpretation.    Scheduled Meds: . amLODipine  10 mg Oral QHS  . aspirin EC  81 mg Oral Daily  . atorvastatin  10 mg Oral QHS  . carvedilol  25 mg Oral BID WC  . Chlorhexidine Gluconate Cloth  6 each Topical  Q0600  . [START ON 09/20/2018] darbepoetin (ARANESP) injection - DIALYSIS  60 mcg Intravenous Q Fri-HD  . docusate sodium  100 mg Oral BID  . feeding supplement (NEPRO CARB STEADY)  237 mL Oral BID BM  . gabapentin  300 mg Oral QHS  . insulin aspart  0-9 Units Subcutaneous Q4H  . insulin glargine  10 Units Subcutaneous BID  . multivitamin  1 tablet Oral QHS  . mupirocin ointment   Nasal BID  . pantoprazole  40 mg Oral Daily  . polyethylene glycol  17 g Oral Daily  . sodium chloride flush  3 mL Intravenous Q12H   Continuous Infusions: . sodium chloride    . sodium chloride    . sodium chloride 10 mL/hr at 09/12/18 0649  . sodium chloride    . cefTRIAXone (ROCEPHIN)  IV 2 g (09/13/18 2201)   And  . metronidazole 500 mg (09/14/18 0405)  . famotidine (PEPCID) IV       LOS: 5 days   Marylu Lund, MD [facility] Pager On Amion  If 7PM-7AM, please contact night-coverage 09/14/2018, 3:52 PM

## 2018-09-14 NOTE — Progress Notes (Signed)
   09/14/18 1000  Clinical Encounter Type  Visited With Patient  Visit Type Initial  Referral From Nurse  Spiritual Encounters  Spiritual Needs Emotional  Stress Factors  Patient Stress Factors Family relationships;Loss of control  Responded to page for patient request chaplain visit. Patient was lying down and quite calm. Patient shared his concern for his recent health problems. He also shared some of his life challenges. Provided empathic listening. Offered continue thoughts and prayers for his peace. Encouraged him to continue to talk to his higher power that he recognizes and utilize his natural supports. Patient said that he feel much better my my support and mere presence.  [name] [phone]

## 2018-09-14 NOTE — Progress Notes (Signed)
Admit: 09/09/2018 LOS: 5  39M new ESRD, R Foot ulcer  Subjective:   . HD #2 yesterday, tolerated well.  1 L UF using new tunneled dialysis catheter . No interval events . AV fistula revised with PTA and ligation of collaterals yesterday . CLIP in process  10/11 0701 - 10/12 0700 In: 617.9 [I.V.:350; IV Piggyback:267.9] Out: 1020 [Blood:20]  Filed Weights   09/11/18 2105 09/13/18 2350 09/14/18 0305  Weight: 99.3 kg 101 kg 100 kg    Scheduled Meds: . amLODipine  10 mg Oral QHS  . aspirin EC  81 mg Oral Daily  . atorvastatin  10 mg Oral QHS  . carvedilol  25 mg Oral BID WC  . Chlorhexidine Gluconate Cloth  6 each Topical Q0600  . [START ON 09/20/2018] darbepoetin (ARANESP) injection - DIALYSIS  60 mcg Intravenous Q Fri-HD  . docusate sodium  100 mg Oral BID  . feeding supplement (NEPRO CARB STEADY)  237 mL Oral BID BM  . gabapentin  300 mg Oral QHS  . insulin aspart  0-9 Units Subcutaneous Q4H  . insulin glargine  10 Units Subcutaneous BID  . multivitamin  1 tablet Oral QHS  . mupirocin ointment   Nasal BID  . pantoprazole  40 mg Oral Daily  . polyethylene glycol  17 g Oral Daily  . sodium chloride flush  3 mL Intravenous Q12H   Continuous Infusions: . sodium chloride    . sodium chloride    . sodium chloride 10 mL/hr at 09/12/18 0649  . sodium chloride    . cefTRIAXone (ROCEPHIN)  IV 2 g (09/13/18 2201)   And  . metronidazole 500 mg (09/14/18 0405)  . famotidine (PEPCID) IV     PRN Meds:.sodium chloride, sodium chloride, sodium chloride, acetaminophen **OR** acetaminophen, alteplase, heparin, hydrALAZINE, HYDROcodone-acetaminophen, labetalol, lidocaine (PF), lidocaine-prilocaine, morphine injection, ondansetron **OR** ondansetron (ZOFRAN) IV, oxyCODONE, pentafluoroprop-tetrafluoroeth, sodium chloride flush  Current Labs: reviewed    Ref. Range 09/10/2018 05:26  Saturation Ratios Latest Ref Range: 17.9 - 39.5 % 24  Ferritin Latest Ref Range: 24 - 336 ng/mL 735 (H)     Ref. Range 09/10/2018 05:26  PTH, Intact Latest Ref Range: 15 - 65 pg/mL 275 (H)    Physical Exam:  Blood pressure (!) 151/78, pulse 85, temperature 100.3 F (37.9 C), temperature source Oral, resp. rate 16, height 6\' 4"  (1.93 m), weight 100 kg, SpO2 96 %. NAD LUE AVF +B/T, deep R Foto ulcer bandaged RRR nl s1s2 CTAB diminished in bases No rashes/lesions S/nt/nd AAO x3, no asterixus L BKA, R TMA present  A 1. New ESRD 1. LUE AVF immature: s/p ligation of collaterals and PTA 09/12/18 2. S/p [facility] 09/13/18 3. CLIP in process, suspect to [employer] 4. HD#1 started 09/10/18 2. R TMA Foot ulcer, VVS/Ortho following; intervention planned 10/14 3. CKD BMD: PTH acceptable currently. P 5.5 hold binders, follow 4. Anemia; Fe indices above.  Needs ESA, start Aranesp 10/11 5. DM2 6. HTN 7. ASCVD/PAD  P . HD #3 today: 3K, 3.5h, TDC, No heparin, 1.5L UF . Medication Issues; o Preferred narcotic agents for pain control are hydromorphone, fentanyl, and methadone. Morphine should not be used.  o Baclofen should be avoided o Avoid oral sodium phosphate and magnesium citrate based laxatives / bowel preps    Pearson Grippe MD 09/14/2018, 10:34 AM  Recent Labs  Lab 09/10/18 0034 09/11/18 0455 09/12/18 0525 09/14/18 0020  NA 139 139 139 135  K 3.9 2.9* 3.5 3.5  CL 114*  105 103 102  CO2 11* 21* 21* 19*  GLUCOSE 153* 144* 155* 178*  BUN 120* 81* 87* 92*  CREATININE 9.09* 6.88* 7.84* 9.43*  CALCIUM 6.5* 6.3* 6.7* 6.7*  PHOS 7.6* 5.5*  --  8.0*   Recent Labs  Lab 09/09/18 1951 09/10/18 0034 09/11/18 0455 09/12/18 0525 09/14/18 0019  WBC 14.8* 13.2* 10.2 11.7* 13.1*  NEUTROABS 12.3* 10.2*  --   --   --   HGB 8.8* 8.9* 8.1* 8.1* 7.1*  HCT 27.7* 29.4* 26.1* 26.9* 24.9*  MCV 72.1* 75.6* 73.3* 74.7* 77.6*  PLT 295 281 235 250 203

## 2018-09-14 NOTE — Progress Notes (Signed)
Pharmacy Antibiotic Note  [name] is a 57 y.o. male admitted on 09/09/2018 with right foot pain and previous left BKA.  Pharmacy has been consulted for vancomycin dosing for right foot ulcer and possible osteomyelitis. 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 He is on ceftriaxone and Flagyl. Random vancomycin level is 25. Received HD at BFR 250 for ~3 hours (not a full session) in the early AM and has been dialyzed again this afternoon at 3.5 hours BFR 350. Given preHD random level of 25 will redose.   Plan: Vancomycin 750mg  IV x 1 this evening.  Further doses once dialysis schedule is known.   Height: 6\' 4"  (193 cm) Weight: 220 lb 7.4 oz (100 kg) IBW/kg (Calculated) : 86.8  Temp (24hrs), Avg:98.8 F (37.1 C), Min:98 F (36.7 C), Max:100.3 F (37.9 C)  Recent Labs  Lab 09/09/18 1951 09/09/18 2000 09/10/18 0034 09/11/18 0455 09/12/18 0525 09/14/18 0019 09/14/18 0020 09/14/18 1222  WBC 14.8*  --  13.2* 10.2 11.7* 13.1*  --   --   CREATININE 9.37*  --  9.09* 6.88* 7.84*  --  9.43*  --   LATICACIDVEN  --  0.56  --   --   --   --   --   --   VANCORANDOM  --   --   --   --   --   --   --  25    Estimated Creatinine Clearance: 10.4 mL/min (A) (by C-G formula based on SCr of 9.43 mg/dL (H)).    No Known Allergies  Antimicrobials this admission: Vancomycin 10/7 >> Zosyn 2.25 g x1 10/7 Ceftriaxone 10/8 >>  Flagyl 10/8 >>   Dose adjustments this admission:   Microbiology results: 10/8 BCx: pending 10/8 MRSA PCR: positive  [name], PharmD, [place] Pager: [phone] Please utilize Amion for appropriate phone number to reach the unit pharmacist ([place])

## 2018-09-14 NOTE — Progress Notes (Signed)
Pharmacy Antibiotic Note  [name] is a 57 y.o. male admitted on 09/09/2018 with right foot pain and previous left BKA.  Pharmacy has been consulted for vancomycin dosing for right foot ulcer and possible osteomyelitis. 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 He is on ceftriaxone and Flagyl. Random vancomycin level is 25. Receiving HD at BFR 250 for ~3 hours (not a full session)   Plan: Follow-up HD schedule and redose as needed   Height: 6\' 4"  (193 cm) Weight: 220 lb 7.4 oz (100 kg) IBW/kg (Calculated) : 86.8  Temp (24hrs), Avg:98.5 F (36.9 C), Min:97.7 F (36.5 C), Max:100.3 F (37.9 C)  Recent Labs  Lab 09/09/18 1951 09/09/18 2000 09/10/18 0034 09/11/18 0455 09/12/18 0525 09/14/18 0019 09/14/18 0020 09/14/18 1222  WBC 14.8*  --  13.2* 10.2 11.7* 13.1*  --   --   CREATININE 9.37*  --  9.09* 6.88* 7.84*  --  9.43*  --   LATICACIDVEN  --  0.56  --   --   --   --   --   --   VANCORANDOM  --   --   --   --   --   --   --  25    Estimated Creatinine Clearance: 10.4 mL/min (A) (by C-G formula based on SCr of 9.43 mg/dL (H)).    No Known Allergies  Antimicrobials this admission: Vancomycin 10/7 >> Zosyn 2.25 g x1 10/7 Ceftriaxone 10/8 >>  Flagyl 10/8 >>   Dose adjustments this admission:   Microbiology results: 10/8 BCx: pending 10/8 MRSA PCR: positive  Thank you for allowing pharmacy to be a part of this patient's care.  Georga Bora, PharmD Clinical Pharmacist 09/14/2018 2:03 PM Please check AMION for all [place] numbers

## 2018-09-14 NOTE — Progress Notes (Signed)
  Progress Note    09/14/2018 10:05 AM 1 Day Post-Op  Subjective: Feeling okay today  Vitals:   09/14/18 0305 09/14/18 0354  BP: (!) 152/78 (!) 151/78  Pulse: 76 85  Resp: 18 16  Temp: 98.2 F (36.8 C) 100.3 F (37.9 C)  SpO2: 98% 96%    Physical Exam: Awake alert oriented Strong thrill left upper arm and incision is clean dry intact Dressing on right TMA site  CBC    Component Value Date/Time   WBC 13.1 (H) 09/14/2018 0019   RBC 3.21 (L) 09/14/2018 0019   HGB 7.1 (L) 09/14/2018 0019   HCT 24.9 (L) 09/14/2018 0019   PLT 203 09/14/2018 0019   MCV 77.6 (L) 09/14/2018 0019   MCH 22.1 (L) 09/14/2018 0019   MCHC 28.5 (L) 09/14/2018 0019   RDW 15.7 (H) 09/14/2018 0019   LYMPHSABS 1.3 09/10/2018 0034   MONOABS 1.5 (H) 09/10/2018 0034   EOSABS 0.1 09/10/2018 0034   BASOSABS 0.1 09/10/2018 0034    BMET    Component Value Date/Time   NA 135 09/14/2018 0020   K 3.5 09/14/2018 0020   CL 102 09/14/2018 0020   CO2 19 (L) 09/14/2018 0020   GLUCOSE 178 (H) 09/14/2018 0020   BUN 92 (H) 09/14/2018 0020   CREATININE 9.43 (H) 09/14/2018 0020   CALCIUM 6.7 (L) 09/14/2018 0020   GFRNONAA 5 (L) 09/14/2018 0020   GFRAA 6 (L) 09/14/2018 0020    INR    Component Value Date/Time   INR 1.17 03/31/2017 0004     Intake/Output Summary (Last 24 hours) at 09/14/2018 1005 Last data filed at 09/14/2018 0305 Gross per 24 hour  Intake 617.94 ml  Output 1020 ml  Net -402.06 ml     Assessment/plan:  57 y.o. male is s/p placement of tunneled dialysis catheter and right IJ and revision of left arm AV fistula.  Catheter okay for use for dialysis We will let fistula he will prior to using this for access.  Plan for retrograde treatment of tibial occlusive disease to aid in healing a transmetatarsal amputation on Monday afternoon.    Hawke Villalpando C. Donzetta Matters, MD [facility] Office: [phone] Pager: [phone]  09/14/2018 10:05 AM

## 2018-09-15 LAB — CULTURE, BLOOD (ROUTINE X 2)
Culture: NO GROWTH
Culture: NO GROWTH
SPECIAL REQUESTS: ADEQUATE
Special Requests: ADEQUATE

## 2018-09-15 LAB — GLUCOSE, CAPILLARY
GLUCOSE-CAPILLARY: 145 mg/dL — AB (ref 70–99)
GLUCOSE-CAPILLARY: 155 mg/dL — AB (ref 70–99)
GLUCOSE-CAPILLARY: 243 mg/dL — AB (ref 70–99)
Glucose-Capillary: 158 mg/dL — ABNORMAL HIGH (ref 70–99)
Glucose-Capillary: 170 mg/dL — ABNORMAL HIGH (ref 70–99)
Glucose-Capillary: 184 mg/dL — ABNORMAL HIGH (ref 70–99)

## 2018-09-15 MED ORDER — VANCOMYCIN VARIABLE DOSE PER UNSTABLE RENAL FUNCTION (PHARMACIST DOSING): Status: DC

## 2018-09-15 NOTE — Progress Notes (Signed)
  Progress Note    09/15/2018 10:43 AM 2 Days Post-Op  Subjective: No complaints this morning  Vitals:   09/14/18 2151 09/15/18 0432  BP: (!) 158/68 (!) 146/68  Pulse:  73  Resp:  12  Temp:  97.9 F (36.6 C)  SpO2:  96%    Physical Exam: Left arm with strong thrill, incision is clean dry and intact and hand is well-perfused Dressing on right TMA site  CBC    Component Value Date/Time   WBC 13.1 (H) 09/14/2018 0019   RBC 3.21 (L) 09/14/2018 0019   HGB 7.1 (L) 09/14/2018 0019   HCT 24.9 (L) 09/14/2018 0019   PLT 203 09/14/2018 0019   MCV 77.6 (L) 09/14/2018 0019   MCH 22.1 (L) 09/14/2018 0019   MCHC 28.5 (L) 09/14/2018 0019   RDW 15.7 (H) 09/14/2018 0019   LYMPHSABS 1.3 09/10/2018 0034   MONOABS 1.5 (H) 09/10/2018 0034   EOSABS 0.1 09/10/2018 0034   BASOSABS 0.1 09/10/2018 0034    BMET    Component Value Date/Time   NA 135 09/14/2018 0020   K 3.5 09/14/2018 0020   CL 102 09/14/2018 0020   CO2 19 (L) 09/14/2018 0020   GLUCOSE 178 (H) 09/14/2018 0020   BUN 92 (H) 09/14/2018 0020   CREATININE 9.43 (H) 09/14/2018 0020   CALCIUM 6.7 (L) 09/14/2018 0020   GFRNONAA 5 (L) 09/14/2018 0020   GFRAA 6 (L) 09/14/2018 0020    INR    Component Value Date/Time   INR 1.17 03/31/2017 0004     Intake/Output Summary (Last 24 hours) at 09/15/2018 1043 Last data filed at 09/15/2018 0444 Gross per 24 hour  Intake 303.32 ml  Output 1500 ml  Net -1196.68 ml     Assessment:  57 y.o. male is s/p revision of left upper arm AV fistula and tunneled dialysis catheter which she is not using for dialysis  Plan: PV lab tomorrow for possible retrograde revascularization of right lower extremity  Because he is scheduled late in the day I told him he can have breakfast in the morning and be n.p.o. past that.  He would also have time for dialysis prior to the procedure if necessary.  Elzina Devera C. Donzetta Matters, MD [facility] Office: [phone] Pager:  [phone]  09/15/2018 10:43 AM

## 2018-09-15 NOTE — Progress Notes (Signed)
Admit: 09/09/2018 LOS: 6  38M new ESRD, R Foot ulcer  Subjective:   . HD #3 yesterday, tolerated well.  1.5 L UF using tunneled dialysis catheter . No interval events . CLIP in process . For angiogram of lower extremity tomorrow  10/12 0701 - 10/13 0700 In: 303.3 [I.V.:0.3; IV Piggyback:303] Out: 1500   Filed Weights   09/14/18 0305 09/14/18 1235 09/14/18 1619  Weight: 100 kg 100.7 kg 97.9 kg    Scheduled Meds: . amLODipine  10 mg Oral QHS  . aspirin EC  81 mg Oral Daily  . atorvastatin  10 mg Oral QHS  . carvedilol  25 mg Oral BID WC  . Chlorhexidine Gluconate Cloth  6 each Topical Q0600  . [START ON 09/20/2018] darbepoetin (ARANESP) injection - DIALYSIS  60 mcg Intravenous Q Fri-HD  . docusate sodium  100 mg Oral BID  . feeding supplement (NEPRO CARB STEADY)  237 mL Oral BID BM  . gabapentin  300 mg Oral QHS  . insulin aspart  0-9 Units Subcutaneous Q4H  . insulin glargine  10 Units Subcutaneous BID  . multivitamin  1 tablet Oral QHS  . mupirocin ointment   Nasal BID  . pantoprazole  40 mg Oral Daily  . polyethylene glycol  17 g Oral Daily  . sodium chloride flush  3 mL Intravenous Q12H   Continuous Infusions: . sodium chloride    . sodium chloride    . sodium chloride Stopped (09/14/18 2041)  . sodium chloride    . cefTRIAXone (ROCEPHIN)  IV Stopped (09/15/18 0127)   And  . metronidazole 500 mg (09/15/18 0444)  . famotidine (PEPCID) IV     PRN Meds:.sodium chloride, sodium chloride, sodium chloride, acetaminophen **OR** acetaminophen, alteplase, heparin, hydrALAZINE, HYDROcodone-acetaminophen, labetalol, lidocaine (PF), lidocaine-prilocaine, ondansetron **OR** ondansetron (ZOFRAN) IV, oxyCODONE, pentafluoroprop-tetrafluoroeth, sodium chloride flush  Current Labs: reviewed    Ref. Range 09/10/2018 05:26  Saturation Ratios Latest Ref Range: 17.9 - 39.5 % 24  Ferritin Latest Ref Range: 24 - 336 ng/mL 735 (H)    Ref. Range 09/10/2018 05:26  PTH, Intact Latest Ref  Range: 15 - 65 pg/mL 275 (H)    Physical Exam:  Blood pressure (!) 146/68, pulse 73, temperature 97.9 F (36.6 C), temperature source Oral, resp. rate 12, height 6\' 4"  (1.93 m), weight 97.9 kg, SpO2 96 %. NAD LUE AVF +B/T, deep R Foto ulcer bandaged RRR nl s1s2 CTAB diminished in bases No rashes/lesions S/nt/nd AAO x3, no asterixus L BKA, R TMA present  A 1. New ESRD 1. LUE AVF immature: s/p ligation of collaterals and PTA 09/12/18 2. S/p [facility] 09/13/18 3. CLIP in process, suspect to [employer] 4. HD#1 started 09/10/18 2. R TMA Foot ulcer, VVS/Ortho following; intervention planned 10/14 with VVS 3. CKD BMD: PTH acceptable currently. P 5.5 hold binders, follow 4. Anemia; Fe indices above.  Needs ESA, start Aranesp 10/11 5. DM2 6. HTN 7. ASCVD/PAD  P . Next HD on 10/15 . Follow-up clinic tomorrow . Medication Issues; o Preferred narcotic agents for pain control are hydromorphone, fentanyl, and methadone. Morphine should not be used.  o Baclofen should be avoided o Avoid oral sodium phosphate and magnesium citrate based laxatives / bowel preps    Pearson Grippe MD 09/15/2018, 11:53 AM  Recent Labs  Lab 09/10/18 0034 09/11/18 0455 09/12/18 0525 09/14/18 0020  NA 139 139 139 135  K 3.9 2.9* 3.5 3.5  CL 114* 105 103 102  CO2 11* 21* 21* 19*  GLUCOSE 153* 144* 155* 178*  BUN 120* 81* 87* 92*  CREATININE 9.09* 6.88* 7.84* 9.43*  CALCIUM 6.5* 6.3* 6.7* 6.7*  PHOS 7.6* 5.5*  --  8.0*   Recent Labs  Lab 09/09/18 1951 09/10/18 0034 09/11/18 0455 09/12/18 0525 09/14/18 0019  WBC 14.8* 13.2* 10.2 11.7* 13.1*  NEUTROABS 12.3* 10.2*  --   --   --   HGB 8.8* 8.9* 8.1* 8.1* 7.1*  HCT 27.7* 29.4* 26.1* 26.9* 24.9*  MCV 72.1* 75.6* 73.3* 74.7* 77.6*  PLT 295 281 235 250 203

## 2018-09-15 NOTE — Anesthesia Postprocedure Evaluation (Signed)
Anesthesia Post Note  Patient: [name]  Procedure(s) Performed: REVISON OF ARTERIOVENOUS FISTULA ARM (Left ) INSERTION OF 23cm DIALYSIS CATHETER (Right ) BALLOON ANGIOPLASTY OF LEFT ARM (Left Arm Upper)     Patient location during evaluation: PACU Anesthesia Type: MAC Level of consciousness: awake and alert Pain management: pain level controlled Vital Signs Assessment: post-procedure vital signs reviewed and stable Respiratory status: spontaneous breathing, nonlabored ventilation, respiratory function stable and patient connected to nasal cannula oxygen Cardiovascular status: stable and blood pressure returned to baseline Postop Assessment: no apparent nausea or vomiting Anesthetic complications: no    Last Vitals:  Vitals:   09/15/18 1255 09/15/18 2004  BP: (!) 146/71 139/73  Pulse: 70   Resp: 18   Temp: 36.7 C 37.1 C  SpO2: 96% 97%    Last Pain:  Vitals:   09/15/18 2004  TempSrc: Oral  PainSc:                  Karyl Kinnier Jacque Byron

## 2018-09-15 NOTE — Progress Notes (Signed)
PROGRESS NOTE    [name]  TFT:[id number] DOB: May 18, 1961 DOA: 09/09/2018 PCP: System, Pcp Not In    Brief Narrative:  57 y.o.malewith medical history significant of advanced CKD, left below-knee amputation, right transmetatarsal foot amputation, hypertension, diabetes, peripheral neuropathy, hyperlipidemia, tobacco abusenow in remission.  Presented with1 week history of right foot pressure worse in the lateral aspect of her right foot and purulent drainage he noticed skin breakdown of the foot about 2 weeks ago. Patient reports he has decreased sensation in his lower extremities and unable to feel pain.  Cr greater than 6. Still makes urine Last time had seen nephrology was 6 m ago. Had Left AC fistula placed in October 2018.  09/10/2018: Patient seen and examined at his bedside.  Reports significant pain in his right stump.  Pain medication in place as needed.  Orthopedic surgery consulted.  ABI done which revealed moderate peripheral artery disease.  Vascular surgery consulted for possible angiogram and intervention.  Assessment & Plan:   Active Problems:   Anemia of chronic disease   AKI (acute kidney injury) ([place])   Essential hypertension   DM type 2 causing CKD stage 4 (HCC)   Unilateral complete BKA, left, sequela (HCC)   Chronic kidney disease (CKD), stage IV (severe) (HCC)   Type 2 diabetes mellitus with peripheral neuropathy (HCC)   Hyperlipidemia   Diabetic foot ulcer (HCC)   Hypocalcemia   Dehydration  Right foot ulcer -Positive ABI with moderate disease -Positive for MRSA screening -Elevated sed rate and CRP noted -R foot MRI done, findings of tiny presumed soft tissue abscess  -Vascular surgery following, s/p aortogram of LE and fistuologram UE -Pt underwent revision of L arm AV fistula tunneled cath 10/11 with revascularization of RLE planned next week  -Continued on empiric on IV Rocephin and IV Flagyl -Vanc was initially added due to positive MRSA  screening -Blood cultures negative  Moderate peripheral artery disease -Continue aspirin and Lipitor -Vascular surgery consulted and pt s/p fistulogram and aortogram -s/p AV fistula revision, plan for revascularization of RLE tomorrow  Severe anion gap metabolic acidosis most likely secondary to renal failure versus others -Now on HD, clip in progress -Continue with HD as tolerated  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 -Nephrology consulted and following -s/p fistuogram, pt is now s/p revison of L arm AV fistula 10/11 -Patient tolerated HD -CLIP in progress  Severe hypomagnesemia -Mg was replaced, labs reviewed  Hyperphosphatemia -Recheck bmet in AM -Currently stable  Hypertension -BP stable at present, albeit suboptimally controlled -Continue with amlodipine, carvedilol as patient tolerates  Constipation -Resolved with lactulose  DVT prophylaxis: Heparin subq Code Status: Full Family Communication: Pt in room, family not at bedside Disposition Plan: Uncertain at this time  Consultants:   Vascular surgery  Orthopedic surgery  Nephrology  Procedures:   Rocephin and IV Flagyl  Antimicrobials: Anti-infectives (From admission, onward)   Start     Dose/Rate Route Frequency Ordered Stop   09/15/18 1410  vancomycin variable dose per unstable renal function (pharmacist dosing)      Does not apply See admin instructions 09/15/18 1410     09/14/18 1800  vancomycin (VANCOCIN) IVPB 750 mg/150 ml premix     750 mg 150 mL/hr over 60 Minutes Intravenous  Once 09/14/18 1750 09/15/18 1636   09/14/18 0345  vancomycin (VANCOCIN) IVPB 1000 mg/200 mL premix     1,000 mg 200 mL/hr over 60 Minutes Intravenous  Once 09/14/18 0337 09/14/18 0613   09/13/18 1500  ceFAZolin (ANCEF)  IVPB 2g/100 mL premix     2 g 200 mL/hr over 30 Minutes Intravenous To ShortStay Procedural 09/12/18 1516 09/13/18 1335   09/12/18 1426  vancomycin variable dose per unstable renal function (pharmacist dosing)   Status:  Discontinued      Does not apply See admin instructions 09/12/18 1426 09/13/18 1646   09/10/18 0030  cefTRIAXone (ROCEPHIN) 2 g in sodium chloride 0.9 % 100 mL IVPB     2 g 200 mL/hr over 30 Minutes Intravenous Daily at bedtime 09/09/18 2344     09/10/18 0000  metroNIDAZOLE (FLAGYL) IVPB 500 mg     500 mg 100 mL/hr over 60 Minutes Intravenous Every 8 hours 09/09/18 2344     09/09/18 2115  piperacillin-tazobactam (ZOSYN) IVPB 3.375 g  Status:  Discontinued     3.375 g 100 mL/hr over 30 Minutes Intravenous  Once 09/09/18 2100 09/09/18 2108   09/09/18 2115  piperacillin-tazobactam (ZOSYN) IVPB 2.25 g     2.25 g 100 mL/hr over 30 Minutes Intravenous  Once 09/09/18 2108 09/09/18 2207   09/09/18 2115  vancomycin (VANCOCIN) 2,000 mg in sodium chloride 0.9 % 500 mL IVPB     2,000 mg 250 mL/hr over 120 Minutes Intravenous  Once 09/09/18 2109 09/09/18 2334      Subjective: Feels better after large BM yesterday  Objective: Vitals:   09/14/18 2038 09/14/18 2151 09/15/18 0432 09/15/18 1255  BP: (!) 145/66 (!) 158/68 (!) 146/68 (!) 146/71  Pulse:   73 70  Resp:   12 18  Temp: 99.2 F (37.3 C)  97.9 F (36.6 C) 98 F (36.7 C)  TempSrc: Oral  Oral Oral  SpO2: 100%  96% 96%  Weight:      Height:        Intake/Output Summary (Last 24 hours) at 09/15/2018 1637 Last data filed at 09/15/2018 1500 Gross per 24 hour  Intake 573.32 ml  Output -  Net 573.32 ml   Filed Weights   09/14/18 0305 09/14/18 1235 09/14/18 1619  Weight: 100 kg 100.7 kg 97.9 kg    Examination: General exam: Conversant, in no acute distress Respiratory system: normal chest rise, clear, no audible wheezing  Data Reviewed: I have personally reviewed following labs and imaging studies  CBC: Recent Labs  Lab 09/09/18 1951 09/10/18 0034 09/11/18 0455 09/12/18 0525 09/14/18 0019  WBC 14.8* 13.2* 10.2 11.7* 13.1*  NEUTROABS 12.3* 10.2*  --   --   --   HGB 8.8* 8.9* 8.1* 8.1* 7.1*  HCT 27.7* 29.4*  26.1* 26.9* 24.9*  MCV 72.1* 75.6* 73.3* 74.7* 77.6*  PLT 295 281 235 250 299   Basic Metabolic Panel: Recent Labs  Lab 09/09/18 1951 09/10/18 0034 09/11/18 0455 09/12/18 0525 09/14/18 0020  NA 140 139 139 139 135  K 3.9 3.9 2.9* 3.5 3.5  CL 111 114* 105 103 102  CO2 11* 11* 21* 21* 19*  GLUCOSE 160* 153* 144* 155* 178*  BUN 118* 120* 81* 87* 92*  CREATININE 9.37* 9.09* 6.88* 7.84* 9.43*  CALCIUM 6.2* 6.5* 6.3* 6.7* 6.7*  MG 1.2* 1.1* 1.4* 2.6*  --   PHOS 7.6* 7.6* 5.5*  --  8.0*   GFR: Estimated Creatinine Clearance: 10.4 mL/min (A) (by C-G formula based on SCr of 9.43 mg/dL (H)). Liver Function Tests: Recent Labs  Lab 09/09/18 1951 09/10/18 0034 09/10/18 1930 09/14/18 0020  AST 20 18  --   --   ALT 30 26 24   --   ALKPHOS 66  55  --   --   BILITOT 0.4 0.6  --   --   PROT 8.3* 7.7  --   --   ALBUMIN 3.2* 2.9*  --  2.3*   No results for input(s): LIPASE, AMYLASE in the last 168 hours. No results for input(s): AMMONIA in the last 168 hours. Coagulation Profile: No results for input(s): INR, PROTIME in the last 168 hours. Cardiac Enzymes: No results for input(s): CKTOTAL, CKMB, CKMBINDEX, TROPONINI in the last 168 hours. BNP (last 3 results) No results for input(s): PROBNP in the last 8760 hours. HbA1C: No results for input(s): HGBA1C in the last 72 hours. CBG: Recent Labs  Lab 09/14/18 2009 09/15/18 0113 09/15/18 0430 09/15/18 0800 09/15/18 1206  GLUCAP 177* 145* 155* 158* 170*   Lipid Profile: No results for input(s): CHOL, HDL, LDLCALC, TRIG, CHOLHDL, LDLDIRECT in the last 72 hours. Thyroid Function Tests: No results for input(s): TSH, T4TOTAL, FREET4, T3FREE, THYROIDAB in the last 72 hours. Anemia Panel: No results for input(s): VITAMINB12, FOLATE, FERRITIN, TIBC, IRON, RETICCTPCT in the last 72 hours. Sepsis Labs: Recent Labs  Lab 09/09/18 2000  LATICACIDVEN 0.56    Recent Results (from the past 240 hour(s))  Blood Cultures x 2 sites      Status: None   Collection Time: 09/10/18 12:30 AM  Result Value Ref Range Status   Specimen Description BLOOD LEFT ARM  Final   Special Requests   Final    BOTTLES DRAWN AEROBIC AND ANAEROBIC Blood Culture adequate volume   Culture   Final    NO GROWTH 5 DAYS Performed at [facility], [address], [place], [place] [address]    Report Status 09/15/2018 FINAL  Final  Blood Cultures x 2 sites     Status: None   Collection Time: 09/10/18 12:34 AM  Result Value Ref Range Status   Specimen Description BLOOD LEFT HAND  Final   Special Requests   Final    BOTTLES DRAWN AEROBIC ONLY Blood Culture adequate volume   Culture   Final    NO GROWTH 5 DAYS Performed at [facility], [place] [address], [place], [place] [address]    Report Status 09/15/2018 FINAL  Final  MRSA PCR Screening     Status: Abnormal   Collection Time: 09/10/18  8:51 AM  Result Value Ref Range Status   MRSA by PCR POSITIVE (A) NEGATIVE Final    Comment:        The GeneXpert MRSA Assay (FDA approved for NASAL specimens only), is one component of a comprehensive MRSA colonization surveillance program. It is not intended to diagnose MRSA infection nor to guide or monitor treatment for MRSA infections. RESULT CALLED TO, READ BACK BY AND VERIFIED WITH: RN Chapman Moss [phone] Performed at [facility] [address] [place], [place] [address]      Radiology Studies: No results found.  Scheduled Meds: . amLODipine  10 mg Oral QHS  . aspirin EC  81 mg Oral Daily  . atorvastatin  10 mg Oral QHS  . carvedilol  25 mg Oral BID WC  . Chlorhexidine Gluconate Cloth  6 each Topical Q0600  . [START ON 09/20/2018] darbepoetin (ARANESP) injection - DIALYSIS  60 mcg Intravenous Q Fri-HD  . docusate sodium  100 mg Oral BID  . feeding supplement (NEPRO CARB STEADY)  237 mL Oral BID BM  . gabapentin  300 mg Oral QHS  . insulin aspart  0-9  Units Subcutaneous Q4H  . insulin glargine  10 Units  Subcutaneous BID  . multivitamin  1 tablet Oral QHS  . mupirocin ointment   Nasal BID  . pantoprazole  40 mg Oral Daily  . polyethylene glycol  17 g Oral Daily  . sodium chloride flush  3 mL Intravenous Q12H  . vancomycin variable dose per unstable renal function (pharmacist dosing)   Does not apply See admin instructions   Continuous Infusions: . sodium chloride    . sodium chloride    . sodium chloride 10 mL/hr at 09/15/18 1500  . sodium chloride    . cefTRIAXone (ROCEPHIN)  IV Stopped (09/15/18 0127)   And  . metronidazole Stopped (09/15/18 1320)  . famotidine (PEPCID) IV       LOS: 6 days   Marylu Lund, MD [facility] Pager On Amion  If 7PM-7AM, please contact night-coverage 09/15/2018, 4:37 PM

## 2018-09-16 ENCOUNTER — Encounter (HOSPITAL_COMMUNITY): Payer: Self-pay | Admitting: Vascular Surgery

## 2018-09-16 ENCOUNTER — Encounter (HOSPITAL_COMMUNITY)
Admission: EM | Disposition: A | Discharge status: Home / Self Care | Payer: Self-pay | Source: Home / Self Care | Attending: Internal Medicine

## 2018-09-16 HISTORY — PX: LOWER EXTREMITY ANGIOGRAPHY: CATH118251

## 2018-09-16 LAB — GLUCOSE, CAPILLARY
GLUCOSE-CAPILLARY: 116 mg/dL — AB (ref 70–99)
GLUCOSE-CAPILLARY: 148 mg/dL — AB (ref 70–99)
GLUCOSE-CAPILLARY: 246 mg/dL — AB (ref 70–99)
Glucose-Capillary: 132 mg/dL — ABNORMAL HIGH (ref 70–99)
Glucose-Capillary: 133 mg/dL — ABNORMAL HIGH (ref 70–99)
Glucose-Capillary: 147 mg/dL — ABNORMAL HIGH (ref 70–99)
Glucose-Capillary: 187 mg/dL — ABNORMAL HIGH (ref 70–99)
Glucose-Capillary: 207 mg/dL — ABNORMAL HIGH (ref 70–99)

## 2018-09-16 LAB — BASIC METABOLIC PANEL
Anion gap: 10 (ref 5–15)
BUN: 42 mg/dL — ABNORMAL HIGH (ref 6–20)
CALCIUM: 7.2 mg/dL — AB (ref 8.9–10.3)
CO2: 26 mmol/L (ref 22–32)
CREATININE: 6.28 mg/dL — AB (ref 0.61–1.24)
Chloride: 100 mmol/L (ref 98–111)
GFR calc non Af Amer: 9 mL/min — ABNORMAL LOW (ref >60)
GFR, EST AFRICAN AMERICAN: 10 mL/min — AB (ref >60)
Glucose, Bld: 142 mg/dL — ABNORMAL HIGH (ref 70–99)
Potassium: 3.4 mmol/L — ABNORMAL LOW (ref 3.5–5.1)
SODIUM: 136 mmol/L (ref 135–145)

## 2018-09-16 LAB — CBC
HEMATOCRIT: 27.5 % — AB (ref 39.0–52.0)
Hemoglobin: 8.1 g/dL — ABNORMAL LOW (ref 13.0–17.0)
MCH: 22.8 pg — ABNORMAL LOW (ref 26.0–34.0)
MCHC: 29.5 g/dL — AB (ref 30.0–36.0)
MCV: 77.2 fL — ABNORMAL LOW (ref 80.0–100.0)
Platelets: 226 10*3/uL (ref 150–400)
RBC: 3.56 MIL/uL — ABNORMAL LOW (ref 4.22–5.81)
RDW: 14.6 % (ref 11.5–15.5)
WBC: 15 10*3/uL — AB (ref 4.0–10.5)
nRBC: 0.3 % — ABNORMAL HIGH (ref 0.0–0.2)

## 2018-09-16 SURGERY — LOWER EXTREMITY ANGIOGRAPHY
Anesthesia: LOCAL | Laterality: Right

## 2018-09-16 MED ORDER — OXYCODONE HCL 5 MG PO TABS
5.0000 mg | ORAL_TABLET | ORAL | Status: DC | PRN
  Administered 2018-09-17 – 2018-09-20 (×12): 10 mg via ORAL
  Filled 2018-09-16 (×11): qty 2

## 2018-09-16 MED ORDER — LABETALOL HCL 5 MG/ML IV SOLN: 10.0000 mg | INTRAVENOUS | Status: DC | PRN

## 2018-09-16 MED ORDER — MIDAZOLAM HCL 2 MG/2ML IJ SOLN
INTRAMUSCULAR | Status: DC | PRN
  Administered 2018-09-16: 1 mg via INTRAVENOUS

## 2018-09-16 MED ORDER — CLOPIDOGREL BISULFATE 300 MG PO TABS
ORAL_TABLET | ORAL | Status: DC | PRN
  Administered 2018-09-16: 300 mg via ORAL

## 2018-09-16 MED ORDER — ACETAMINOPHEN 325 MG PO TABS: 650.0000 mg | ORAL_TABLET | ORAL | Status: DC | PRN

## 2018-09-16 MED ORDER — SODIUM CHLORIDE 0.9% FLUSH
3.0000 mL | Freq: Two times a day (BID) | INTRAVENOUS | Status: DC
  Administered 2018-09-16 – 2018-09-18 (×5): 3 mL via INTRAVENOUS

## 2018-09-16 MED ORDER — HYDRALAZINE HCL 20 MG/ML IJ SOLN: 5.0000 mg | INTRAMUSCULAR | Status: DC | PRN

## 2018-09-16 MED ORDER — FENTANYL CITRATE (PF) 100 MCG/2ML IJ SOLN
INTRAMUSCULAR | Status: DC | PRN
  Administered 2018-09-16 (×2): 25 ug via INTRAVENOUS

## 2018-09-16 MED ORDER — MORPHINE SULFATE (PF) 2 MG/ML IV SOLN
2.0000 mg | INTRAVENOUS | Status: DC | PRN
  Administered 2018-09-16 – 2018-09-17 (×2): 2 mg via INTRAVENOUS
  Filled 2018-09-16 (×2): qty 1

## 2018-09-16 MED ORDER — CLOPIDOGREL BISULFATE 75 MG PO TABS: 75.0000 mg | ORAL_TABLET | Freq: Every day | ORAL | Status: DC

## 2018-09-16 MED ORDER — HEPARIN (PORCINE) IN NACL 1000-0.9 UT/500ML-% IV SOLN
INTRAVENOUS | Status: DC | PRN
  Administered 2018-09-16: 500 mL

## 2018-09-16 MED ORDER — CHLORHEXIDINE GLUCONATE CLOTH 2 % EX PADS
6.0000 | MEDICATED_PAD | Freq: Every day | CUTANEOUS | Status: DC
  Administered 2018-09-16 – 2018-09-18 (×3): 6 via TOPICAL

## 2018-09-16 MED ORDER — MIDAZOLAM HCL 2 MG/2ML IJ SOLN
INTRAMUSCULAR | Status: AC
  Filled 2018-09-16: qty 2

## 2018-09-16 MED ORDER — LIDOCAINE HCL (PF) 1 % IJ SOLN
INTRAMUSCULAR | Status: DC | PRN
  Administered 2018-09-16: 15 mL
  Administered 2018-09-16: 2 mL

## 2018-09-16 MED ORDER — CLOPIDOGREL BISULFATE 75 MG PO TABS: 300.0000 mg | ORAL_TABLET | Freq: Once | ORAL | Status: DC

## 2018-09-16 MED ORDER — SODIUM CHLORIDE 0.9% FLUSH: 3.0000 mL | INTRAVENOUS | Status: DC | PRN

## 2018-09-16 MED ORDER — FENTANYL CITRATE (PF) 100 MCG/2ML IJ SOLN
INTRAMUSCULAR | Status: AC
  Filled 2018-09-16: qty 2

## 2018-09-16 MED ORDER — HEPARIN SODIUM (PORCINE) 1000 UNIT/ML IJ SOLN
INTRAMUSCULAR | Status: DC | PRN
  Administered 2018-09-16: 10000 [IU] via INTRAVENOUS
  Administered 2018-09-16: 5000 [IU] via INTRAVENOUS

## 2018-09-16 MED ORDER — CLOPIDOGREL BISULFATE 75 MG PO TABS
75.0000 mg | ORAL_TABLET | Freq: Every day | ORAL | Status: DC
  Administered 2018-09-17 – 2018-09-20 (×4): 75 mg via ORAL
  Filled 2018-09-16 (×4): qty 1

## 2018-09-16 MED ORDER — SODIUM CHLORIDE 0.9 % IV SOLN
INTRAVENOUS | Status: AC | PRN
  Administered 2018-09-16: 10 mL/h via INTRAVENOUS

## 2018-09-16 MED ORDER — CLOPIDOGREL BISULFATE 300 MG PO TABS
ORAL_TABLET | ORAL | Status: AC
  Filled 2018-09-16: qty 1

## 2018-09-16 MED ORDER — DARBEPOETIN ALFA 200 MCG/0.4ML IJ SOSY: 200.0000 ug | PREFILLED_SYRINGE | INTRAMUSCULAR | Status: DC

## 2018-09-16 MED ORDER — HEPARIN SODIUM (PORCINE) 1000 UNIT/ML IJ SOLN
INTRAMUSCULAR | Status: AC
  Filled 2018-09-16: qty 1

## 2018-09-16 MED ORDER — MORPHINE SULFATE (PF) 10 MG/ML IV SOLN: 2.0000 mg | INTRAVENOUS | Status: DC | PRN

## 2018-09-16 MED ORDER — LIDOCAINE HCL (PF) 1 % IJ SOLN
INTRAMUSCULAR | Status: AC
  Filled 2018-09-16: qty 30

## 2018-09-16 MED ORDER — HEPARIN (PORCINE) IN NACL 1000-0.9 UT/500ML-% IV SOLN
INTRAVENOUS | Status: AC
  Filled 2018-09-16: qty 1000

## 2018-09-16 MED ORDER — SODIUM CHLORIDE 0.9 % IV SOLN: 250.0000 mL | INTRAVENOUS | Status: DC | PRN

## 2018-09-16 MED ORDER — SODIUM CHLORIDE 0.9 % IV SOLN
125.0000 mg | INTRAVENOUS | Status: DC
  Administered 2018-09-17: 125 mg via INTRAVENOUS
  Filled 2018-09-16 (×2): qty 10

## 2018-09-16 MED ORDER — ONDANSETRON HCL 4 MG/2ML IJ SOLN: 4.0000 mg | Freq: Four times a day (QID) | INTRAMUSCULAR | Status: DC | PRN

## 2018-09-16 SURGICAL SUPPLY — 27 items
BALLN STERLING OTW 2X220X150 (BALLOONS) ×2
BALLN STERLING OTW 3X150X150 (BALLOONS) ×2
BALLOON STERLING OTW 2X220X150 (BALLOONS) ×1 IMPLANT
BALLOON STERLING OTW 3X150X150 (BALLOONS) ×1 IMPLANT
CATH CXI 4F 150 ST (CATHETERS) ×2 IMPLANT
CATH CXI SUPP ST 2.6FR 150CM (CATHETERS) ×2 IMPLANT
CATH OMNI FLUSH 5F 65CM (CATHETERS) ×2 IMPLANT
CATH QUICKCROSS ANG SELECT (CATHETERS) ×2 IMPLANT
CATH QUICKCROSS SUPP .035X90CM (MICROCATHETER) ×2 IMPLANT
CLOSURE MYNX CONTROL 6F/7F (Vascular Products) ×2 IMPLANT
DEVICE ONE SNARE 10MM (MISCELLANEOUS) ×2 IMPLANT
DEVICE TORQUE .025-.038 (MISCELLANEOUS) ×2 IMPLANT
GUIDEWIRE ANGLED .035X260CM (WIRE) ×2 IMPLANT
KIT ENCORE 26 ADVANTAGE (KITS) ×2 IMPLANT
KIT MICROPUNCTURE NIT STIFF (SHEATH) ×2 IMPLANT
KIT PV (KITS) ×2 IMPLANT
PATCH THROMBIX TOPICAL PLAIN (HEMOSTASIS) ×2 IMPLANT
SHEATH FLEX ANSEL ST 6FR 45CM (SHEATH) ×2 IMPLANT
SHEATH MICROPUNCTURE PEDAL 4FR (SHEATH) ×2 IMPLANT
SHEATH MICROPUNCTURE PEDAL 5FR (SHEATH) ×2 IMPLANT
SHEATH PINNACLE 5F 10CM (SHEATH) ×2 IMPLANT
SHEATH PINNACLE 6F 10CM (SHEATH) ×2 IMPLANT
SHEATH PROBE COVER 6X72 (BAG) ×2 IMPLANT
TRANSDUCER W/STOPCOCK (MISCELLANEOUS) ×2 IMPLANT
TRAY PV CATH (CUSTOM PROCEDURE TRAY) ×2 IMPLANT
WIRE BENTSON .035X145CM (WIRE) ×2 IMPLANT
WIRE G V18X300CM (WIRE) ×4 IMPLANT

## 2018-09-16 NOTE — Progress Notes (Signed)
Subjective: Interval History: has no complaint. .  Objective: Vital signs in last 24 hours: Temp:  [98 F (36.7 C)-99.2 F (37.3 C)] 99.2 F (37.3 C) (10/14 0847) Pulse Rate:  [70-79] 79 (10/14 0847) Resp:  [18] 18 (10/13 1255) BP: (139-156)/(63-73) 156/73 (10/14 0847) SpO2:  [95 %-100 %] 100 % (10/14 0847) Weight change:   Intake/Output from previous day: 10/13 0701 - 10/14 0700 In: 876 [I.V.:139; NG/GT:237; IV Piggyback:500] Out: -  Intake/Output this shift: Total I/O In: 240 [P.O.:240] Out: -   General appearance: alert, cooperative and no distress Resp: clear to auscultation bilaterally Chest wall: RIJ PC Cardio: S1, S2 normal and systolic murmur: systolic ejection 2/6, crescendo and decrescendo at 2nd left intercostal space GI: pos bs, liver down 4 cm, soft Extremities: AVF repaired LUA, L BKA, TMA with dressing on R  Lab Results: Recent Labs    09/14/18 0019 09/16/18 0532  WBC 13.1* 15.0*  HGB 7.1* 8.1*  HCT 24.9* 27.5*  PLT 203 226   BMET:  Recent Labs    09/14/18 0020 09/16/18 0532  NA 135 136  K 3.5 3.4*  CL 102 100  CO2 19* 26  GLUCOSE 178* 142*  BUN 92* 42*  CREATININE 9.43* 6.28*  CALCIUM 6.7* 7.2*   No results for input(s): PTH in the last 72 hours. Iron Studies: No results for input(s): IRON, TIBC, TRANSFERRIN, FERRITIN in the last 72 hours.  Studies/Results: No results found.  I have reviewed the patient's current medications.  Assessment/Plan: 1 New ESRD getting CLIP,  HD tomorrow. Vol xs.   2 Anemia low Hb, ^ esa 3 HPTH vit D 4 DM controlled 5 HTN lower vol/meds 6 PVD per VVS P HD, esa, lower vol    LOS: 7 days   [name] 09/16/2018,11:07 AM

## 2018-09-16 NOTE — Op Note (Signed)
Patient name: [name] MRN: [id number] DOB: 1961-07-03 Sex: male  09/16/2018 Pre-operative Diagnosis: Critical right lower extremity ischemia with wound Post-operative diagnosis:  Same Surgeon:  Erlene Quan C. Donzetta Matters, MD Assistant: Marty Heck, MD Procedure Performed: 1.  Ultrasound-guided cannulation left common femoral artery 2.  Right lower extremity angiogram 3.  Ultrasound-guided cannulation right posterior tibial artery 4.  Ultrasound-guided cannulation right dorsalis pedis artery 5.  Balloon angioplasty of anterior tibial artery with 3 mm balloon 6.  Moderate sedation with fentanyl and Versed for 140 minutes 7.  Minx closure device left common femoral artery   Indications: 57 year old male with history of left below-knee amputation and right transmetatarsal amputation and now has ulceration on the lateral aspect.  He is undergone angiogram demonstrating severe tibial disease and is now indicated for possible retrograde access.  Findings: I initially attempted to access his posterior tibial artery but this was to no avail.  I performed right lower extremity angiogram from the right popliteal artery identified a dorsalis pedis artery.  This was cannulated we are able to obtain through and through access and performed balloon angioplasty with 3 mm balloon.  At completion there was not great visualization with picture although there did not appear to be flow-limiting stenosis or dissection but there was a very strong signal at the dorsalis pedis at the level of the foot and there was bleeding from our access site.   Procedure:  The patient was identified in the holding area and taken to room 8.  The patient was then placed supine on the table and prepped and draped in the usual sterile fashion.  A time out was called.  Ultrasound was used to evaluate the right posterior tibial artery this was noted to be heavily calcified.  With multiple attempts I was able to cannulate it but  cannot get a wire to pass.  I then turned my attention to the left common femoral artery were identified a patent femoral vessel.  An image was saved the permanent record.  I cannulated this with micropuncture needle followed by wire and sheath placed a Bentson wire followed by 5 French sheath.  I used an Omni Flush catheter across the bifurcation with Bentson wire then placed a long 6 French sheath into the right SFA and the patient was fully heparinized.  I then used a quick cross catheter Glidewire to get to the level of the popliteal artery and performed angiogram where I was able to identify dorsalis pedis artery is only reconstituting vessel in the foot.  I then used a micropuncture kit to cannulate the dorsalis pedis was able to place a V 18 retrograde.  Unfortunately could not get a V 18 to go retrograde fully so I switched an 035 system with quick cross catheter and Glidewire and I was able to get above the knee.  I then tracked a CXI catheter above the knee and placed a V 18 into the SFA.  From above I was then able to snare and obtain through and through access.  We then performed first balloon Angie plasty with 2 mm balloon followed by 3 mm balloon.  After this though we could not obtain great pictures from the sheath shot down to the foot we were able to get good bleeding a great signal at the dorsalis pedis artery.  Satisfied with this we then removed our wire and balloon exchanged for a Bentson wire exchanged for a short 6 French sheath in the left common femoral  artery and deployed a minx device.  He tolerated this procedure well without immediate complication.  Contrast: 73 cc per    Erlene Quan C. Donzetta Matters, MD [facility] Office: [phone] Pager: [phone]

## 2018-09-16 NOTE — Progress Notes (Signed)
Physical Therapy Treatment Patient Details Name: [name] MRN: [id number] DOB: 1961-04-14 Today's Date: 09/16/2018    History of Present Illness [name] is a 57 y.o. male with medical history significant of CKD, left below-knee amputation, right transmetatarsal foot amputation, hypertension, diabetes, peripheral neuropathy, hyperlipidemia, admitted with worsening fatigue, weakness, lightheadedness and nausea and vomiting -- with admitting diagnosis of Renal failure, and now starting HD; Also in the past 2 weeks, pt reports an ulcer on R foot, lateral aspect near transmet amputation, as well as L prosthesis more painful, leading to less walking, and functional decline on top of Renal Failure. now s/p Tunneled catheter placement and work on his fistula; plans for vascular intervention to perfuse RLE on afternoon of 10/14    PT Comments    Continuing work on functional mobility and activity tolerance; EXCELLENT improvements noted in activity tolerance since last PT session; for vascular procedure this PM; Mildly unsteady with amb -- will plan to use cane for amb next session   Follow Up Recommendations  Outpatient PT     Equipment Recommendations  None recommended by PT;Other (comment)(will continue to assess, esp based on functional status post HD)    Recommendations for Other Services (Evaluation of Prosthesis and R shoe insert by Prosthetist)     Precautions / Restrictions Precautions Precautions: Fall Precaution Comments: Be mindful of pressure on R lateral aspect of foot, and of prosthesis fit/pain Required Braces or Orthoses: Other Brace/Splint Other Brace/Splint: Shoe insert/orthosis R foot; Prosthesis L LE(pt to ask wife to bring in shoe insert) Restrictions Other Position/Activity Restrictions: ill fitting prosthesis, has not seen [employer] yet    Mobility  Bed Mobility Overal bed mobility: Modified Independent                Transfers Overall transfer  level: Needs assistance Equipment used: None Transfers: Sit to/from Stand Sit to Stand: Min guard         General transfer comment: Dependent on UE support for balance; no need for physical assist to rise; ECXELLENT improvement in activity tolerance  Ambulation/Gait Ambulation/Gait assistance: Min guard Gait Distance (Feet): 300 Feet Assistive device: None(occasional use of hallway rail) Gait Pattern/deviations: Antalgic(Assymetric)     General Gait Details: Assymetric and antalgic gait; reports pain R foot at wound/lateral aspect; mildly unsteady, naturally reaching out for hallway rail   Stairs             Wheelchair Mobility    Modified Rankin (Stroke Patients Only)       Balance     Sitting balance-Leahy Scale: Good       Standing balance-Leahy Scale: Fair                              Cognition Arousal/Alertness: Awake/alert Behavior During Therapy: WFL for tasks assessed/performed Overall Cognitive Status: Within Functional Limits for tasks assessed                                        Exercises      General Comments General comments (skin integrity, edema, etc.): MUCH improved activity tolerance      Pertinent Vitals/Pain Pain Assessment: Faces Faces Pain Scale: Hurts a little bit Pain Location: R foot pain with today's walk, prosthesis bothering him Pain Descriptors / Indicators: Grimacing Pain Intervention(s): Monitored during session    Home Living  Prior Function            PT Goals (current goals can now be found in the care plan section) Acute Rehab PT Goals Patient Stated Goal: be able to walk; be able to return to work PT Goal Formulation: With patient Time For Goal Achievement: 09/24/18 Potential to Achieve Goals: Good Progress towards PT goals: Progressing toward goals    Frequency    Min 3X/week      PT Plan Current plan remains appropriate     Co-evaluation              AM-PAC PT "6 Clicks" Daily Activity  Outcome Measure  Difficulty turning over in bed (including adjusting bedclothes, sheets and blankets)?: None Difficulty moving from lying on back to sitting on the side of the bed? : None Difficulty sitting down on and standing up from a chair with arms (e.g., wheelchair, bedside commode, etc,.)?: A Little Help needed moving to and from a bed to chair (including a wheelchair)?: A Little Help needed walking in hospital room?: A Little Help needed climbing 3-5 steps with a railing? : A Lot 6 Click Score: 19    End of Session Equipment Utilized During Treatment: Gait belt Activity Tolerance: Patient tolerated treatment well Patient left: in bed;with call bell/phone within reach Nurse Communication: Mobility status PT Visit Diagnosis: Unsteadiness on feet (R26.81);Other abnormalities of gait and mobility (R26.89);Muscle weakness (generalized) (M62.81);Pain Pain - Right/Left: (R foot, L Residual Limb) Pain - part of body: (R foot, L Residual Limb)     Time: 0814-4818 PT Time Calculation (min) (ACUTE ONLY): 20 min  Charges:  $Gait Training: 8-22 mins                     Roney Marion, PT  [place] Pager [phone] Office [place] 09/16/2018, 1:39 PM

## 2018-09-16 NOTE — Progress Notes (Signed)
PROGRESS NOTE    [name]  UYQ:[id number] DOB: 23-Feb-1961 DOA: 09/09/2018 PCP: System, Pcp Not In    Brief Narrative:  57 y.o.malewith medical history significant of advanced CKD, left below-knee amputation, right transmetatarsal foot amputation, hypertension, diabetes, peripheral neuropathy, hyperlipidemia, tobacco abusenow in remission.  Presented with1 week history of right foot pressure worse in the lateral aspect of her right foot and purulent drainage he noticed skin breakdown of the foot about 2 weeks ago. Patient reports he has decreased sensation in his lower extremities and unable to feel pain.  Cr greater than 6. Still makes urine Last time had seen nephrology was 6 m ago. Had Left AC fistula placed in October 2018.  09/10/2018: Patient seen and examined at his bedside.  Reports significant pain in his right stump.  Pain medication in place as needed.  Orthopedic surgery consulted.  ABI done which revealed moderate peripheral artery disease.  Vascular surgery consulted for possible angiogram and intervention.  Assessment & Plan:   Active Problems:   Anemia of chronic disease   AKI (acute kidney injury) ([place])   Essential hypertension   DM type 2 causing CKD stage 4 (HCC)   Unilateral complete BKA, left, sequela (HCC)   Chronic kidney disease (CKD), stage IV (severe) (HCC)   Type 2 diabetes mellitus with peripheral neuropathy (HCC)   Hyperlipidemia   Diabetic foot ulcer (HCC)   Hypocalcemia   Dehydration  Right foot ulcer -Positive ABI with moderate disease -Positive for MRSA screening -Elevated sed rate and CRP noted -R foot MRI done, findings of tiny presumed soft tissue abscess  -Vascular surgery following, s/p aortogram of LE and fistuologram UE -Pt underwent revision of L arm AV fistula tunneled cath 10/11 with revascularization of RLE planned for today -Continued on empiric on IV Rocephin and IV Flagyl -Vanc was initially added due to positive MRSA  screening -Blood cultures negative  Moderate peripheral artery disease -Continue aspirin and Lipitor -Vascular surgery consulted and pt s/p fistulogram and aortogram -s/p AV fistula revision, plan for revascularization of RLE today  Severe anion gap metabolic acidosis most likely secondary to renal failure versus others -Now on HD, clip in progress -Continue with HD as tolerated  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 -Nephrology consulted and following -s/p fistuogram, pt is now s/p revison of L arm AV fistula 10/11 -Patient tolerated HD -CLIP currently in progress  Severe hypomagnesemia -Mg was replaced, labs reviewed  Hyperphosphatemia -Recheck bmet in AM -Currently stable  Hypertension -BP stable at present, albeit suboptimally controlled -Continue with amlodipine, carvedilol as patient tolerates  Constipation -Resolved with lactulose  DVT prophylaxis: Heparin subq Code Status: Full Family Communication: Pt in room, family not at bedside Disposition Plan: Uncertain at this time  Consultants:   Vascular surgery  Orthopedic surgery  Nephrology  Procedures:   Rocephin and IV Flagyl  Antimicrobials: Anti-infectives (From admission, onward)   Start     Dose/Rate Route Frequency Ordered Stop   09/15/18 1410  vancomycin variable dose per unstable renal function (pharmacist dosing)      Does not apply See admin instructions 09/15/18 1410     09/14/18 1800  vancomycin (VANCOCIN) IVPB 750 mg/150 ml premix     750 mg 150 mL/hr over 60 Minutes Intravenous  Once 09/14/18 1750 09/15/18 1636   09/14/18 0345  vancomycin (VANCOCIN) IVPB 1000 mg/200 mL premix     1,000 mg 200 mL/hr over 60 Minutes Intravenous  Once 09/14/18 0337 09/14/18 0613   09/13/18 1500  ceFAZolin (ANCEF)  IVPB 2g/100 mL premix     2 g 200 mL/hr over 30 Minutes Intravenous To ShortStay Procedural 09/12/18 1516 09/13/18 1335   09/12/18 1426  vancomycin variable dose per unstable renal function (pharmacist  dosing)  Status:  Discontinued      Does not apply See admin instructions 09/12/18 1426 09/13/18 1646   09/10/18 0030  cefTRIAXone (ROCEPHIN) 2 g in sodium chloride 0.9 % 100 mL IVPB     2 g 200 mL/hr over 30 Minutes Intravenous Daily at bedtime 09/09/18 2344     09/10/18 0000  metroNIDAZOLE (FLAGYL) IVPB 500 mg     500 mg 100 mL/hr over 60 Minutes Intravenous Every 8 hours 09/09/18 2344     09/09/18 2115  piperacillin-tazobactam (ZOSYN) IVPB 3.375 g  Status:  Discontinued     3.375 g 100 mL/hr over 30 Minutes Intravenous  Once 09/09/18 2100 09/09/18 2108   09/09/18 2115  piperacillin-tazobactam (ZOSYN) IVPB 2.25 g     2.25 g 100 mL/hr over 30 Minutes Intravenous  Once 09/09/18 2108 09/09/18 2207   09/09/18 2115  vancomycin (VANCOCIN) 2,000 mg in sodium chloride 0.9 % 500 mL IVPB     2,000 mg 250 mL/hr over 120 Minutes Intravenous  Once 09/09/18 2109 09/09/18 2334      Subjective: Without complaints at this time  Objective: Vitals:   09/15/18 1255 09/15/18 2004 09/16/18 0548 09/16/18 0847  BP: (!) 146/71 139/73 (!) 147/63 (!) 156/73  Pulse: 70   79  Resp: 18     Temp: 98 F (36.7 C) 98.7 F (37.1 C) 99 F (37.2 C) 99.2 F (37.3 C)  TempSrc: Oral Oral Oral Oral  SpO2: 96% 97% 95% 100%  Weight:      Height:        Intake/Output Summary (Last 24 hours) at 09/16/2018 1614 Last data filed at 09/16/2018 0800 Gross per 24 hour  Intake 645.97 ml  Output -  Net 645.97 ml   Filed Weights   09/14/18 0305 09/14/18 1235 09/14/18 1619  Weight: 100 kg 100.7 kg 97.9 kg    Examination: General exam: Awake, laying in bed, in nad Respiratory system: Normal respiratory effort, no wheezing  Data Reviewed: I have personally reviewed following labs and imaging studies  CBC: Recent Labs  Lab 09/09/18 1951 09/10/18 0034 09/11/18 0455 09/12/18 0525 09/14/18 0019 09/16/18 0532  WBC 14.8* 13.2* 10.2 11.7* 13.1* 15.0*  NEUTROABS 12.3* 10.2*  --   --   --   --   HGB 8.8* 8.9*  8.1* 8.1* 7.1* 8.1*  HCT 27.7* 29.4* 26.1* 26.9* 24.9* 27.5*  MCV 72.1* 75.6* 73.3* 74.7* 77.6* 77.2*  PLT 295 281 235 250 203 030   Basic Metabolic Panel: Recent Labs  Lab 09/09/18 1951 09/10/18 0034 09/11/18 0455 09/12/18 0525 09/14/18 0020 09/16/18 0532  NA 140 139 139 139 135 136  K 3.9 3.9 2.9* 3.5 3.5 3.4*  CL 111 114* 105 103 102 100  CO2 11* 11* 21* 21* 19* 26  GLUCOSE 160* 153* 144* 155* 178* 142*  BUN 118* 120* 81* 87* 92* 42*  CREATININE 9.37* 9.09* 6.88* 7.84* 9.43* 6.28*  CALCIUM 6.2* 6.5* 6.3* 6.7* 6.7* 7.2*  MG 1.2* 1.1* 1.4* 2.6*  --   --   PHOS 7.6* 7.6* 5.5*  --  8.0*  --    GFR: Estimated Creatinine Clearance: 15.5 mL/min (A) (by C-G formula based on SCr of 6.28 mg/dL (H)). Liver Function Tests: Recent Labs  Lab 09/09/18 1951 09/10/18  0034 09/10/18 1930 09/14/18 0020  AST 20 18  --   --   ALT 30 26 24   --   ALKPHOS 66 55  --   --   BILITOT 0.4 0.6  --   --   PROT 8.3* 7.7  --   --   ALBUMIN 3.2* 2.9*  --  2.3*   No results for input(s): LIPASE, AMYLASE in the last 168 hours. No results for input(s): AMMONIA in the last 168 hours. Coagulation Profile: No results for input(s): INR, PROTIME in the last 168 hours. Cardiac Enzymes: No results for input(s): CKTOTAL, CKMB, CKMBINDEX, TROPONINI in the last 168 hours. BNP (last 3 results) No results for input(s): PROBNP in the last 8760 hours. HbA1C: No results for input(s): HGBA1C in the last 72 hours. CBG: Recent Labs  Lab 09/15/18 2002 09/16/18 0018 09/16/18 0319 09/16/18 0802 09/16/18 1137  GLUCAP 243* 207* 148* 147* 187*   Lipid Profile: No results for input(s): CHOL, HDL, LDLCALC, TRIG, CHOLHDL, LDLDIRECT in the last 72 hours. Thyroid Function Tests: No results for input(s): TSH, T4TOTAL, FREET4, T3FREE, THYROIDAB in the last 72 hours. Anemia Panel: No results for input(s): VITAMINB12, FOLATE, FERRITIN, TIBC, IRON, RETICCTPCT in the last 72 hours. Sepsis Labs: Recent Labs  Lab  09/09/18 2000  LATICACIDVEN 0.56    Recent Results (from the past 240 hour(s))  Blood Cultures x 2 sites     Status: None   Collection Time: 09/10/18 12:30 AM  Result Value Ref Range Status   Specimen Description BLOOD LEFT ARM  Final   Special Requests   Final    BOTTLES DRAWN AEROBIC AND ANAEROBIC Blood Culture adequate volume   Culture   Final    NO GROWTH 5 DAYS Performed at [facility], [address], [place], [place] [address]    Report Status 09/15/2018 FINAL  Final  Blood Cultures x 2 sites     Status: None   Collection Time: 09/10/18 12:34 AM  Result Value Ref Range Status   Specimen Description BLOOD LEFT HAND  Final   Special Requests   Final    BOTTLES DRAWN AEROBIC ONLY Blood Culture adequate volume   Culture   Final    NO GROWTH 5 DAYS Performed at [facility], [place] [address], [place], [place] [id number]    Report Status 09/15/2018 FINAL  Final  MRSA PCR Screening     Status: Abnormal   Collection Time: 09/10/18  8:51 AM  Result Value Ref Range Status   MRSA by PCR POSITIVE (A) NEGATIVE Final    Comment:        The GeneXpert MRSA Assay (FDA approved for NASAL specimens only), is one component of a comprehensive MRSA colonization surveillance program. It is not intended to diagnose MRSA infection nor to guide or monitor treatment for MRSA infections. RESULT CALLED TO, READ BACK BY AND VERIFIED WITH: RN Chapman Moss [phone] MLM Performed at [facility] [address], [place], [place] [address]      Radiology Studies: No results found.  Scheduled Meds: . amLODipine  10 mg Oral QHS  . aspirin EC  81 mg Oral Daily  . atorvastatin  10 mg Oral QHS  . carvedilol  25 mg Oral BID WC  . Chlorhexidine Gluconate Cloth  6 each Topical Q0600  . Chlorhexidine Gluconate Cloth  6 each Topical Q0600  . [START ON 09/20/2018] darbepoetin (ARANESP) injection - DIALYSIS  200 mcg Intravenous Q Fri-HD  .  docusate sodium  100 mg Oral BID  . feeding  supplement (NEPRO CARB STEADY)  237 mL Oral BID BM  . gabapentin  300 mg Oral QHS  . insulin aspart  0-9 Units Subcutaneous Q4H  . insulin glargine  10 Units Subcutaneous BID  . multivitamin  1 tablet Oral QHS  . mupirocin ointment   Nasal BID  . pantoprazole  40 mg Oral Daily  . polyethylene glycol  17 g Oral Daily  . sodium chloride flush  3 mL Intravenous Q12H  . vancomycin variable dose per unstable renal function (pharmacist dosing)   Does not apply See admin instructions   Continuous Infusions: . sodium chloride    . sodium chloride    . sodium chloride Stopped (09/16/18 0612)  . sodium chloride    . cefTRIAXone (ROCEPHIN)  IV Stopped (09/15/18 2154)   And  . metronidazole 500 mg (09/16/18 1206)  . famotidine (PEPCID) IV    . [START ON 09/17/2018] ferric gluconate (FERRLECIT/NULECIT) IV       LOS: 7 days   Marylu Lund, MD [facility] Pager On Amion  If 7PM-7AM, please contact night-coverage 09/16/2018, 4:14 PM

## 2018-09-16 NOTE — Progress Notes (Signed)
  Progress Note    09/16/2018 10:59 AM 3 Days Post-Op  Subjective: No overnight issues  Vitals:   09/16/18 0548 09/16/18 0847  BP: (!) 147/63 (!) 156/73  Pulse:  79  Resp:    Temp: 99 F (37.2 C) 99.2 F (37.3 C)  SpO2: 95% 100%    Physical Exam: Awake alert oriented Strong thrill left upper arm Right transmetatarsal amputation with dressing on the lateral aspect  CBC    Component Value Date/Time   WBC 15.0 (H) 09/16/2018 0532   RBC 3.56 (L) 09/16/2018 0532   HGB 8.1 (L) 09/16/2018 0532   HCT 27.5 (L) 09/16/2018 0532   PLT 226 09/16/2018 0532   MCV 77.2 (L) 09/16/2018 0532   MCH 22.8 (L) 09/16/2018 0532   MCHC 29.5 (L) 09/16/2018 0532   RDW 14.6 09/16/2018 0532   LYMPHSABS 1.3 09/10/2018 0034   MONOABS 1.5 (H) 09/10/2018 0034   EOSABS 0.1 09/10/2018 0034   BASOSABS 0.1 09/10/2018 0034    BMET    Component Value Date/Time   NA 136 09/16/2018 0532   K 3.4 (L) 09/16/2018 0532   CL 100 09/16/2018 0532   CO2 26 09/16/2018 0532   GLUCOSE 142 (H) 09/16/2018 0532   BUN 42 (H) 09/16/2018 0532   CREATININE 6.28 (H) 09/16/2018 0532   CALCIUM 7.2 (L) 09/16/2018 0532   GFRNONAA 9 (L) 09/16/2018 0532   GFRAA 10 (L) 09/16/2018 0532    INR    Component Value Date/Time   INR 1.17 03/31/2017 0004     Intake/Output Summary (Last 24 hours) at 09/16/2018 1059 Last data filed at 09/16/2018 0800 Gross per 24 hour  Intake 1115.97 ml  Output -  Net 1115.97 ml     Assessment:  57 y.o. male is s/p revision of left upper arm AV fistula tunneled dialysis catheter now on dialysis.  Plan: Okay for breakfast and n.p.o. After PV lab today for possible retrograde revascularization right lower extremity   [name] C. Donzetta Matters, MD [facility] Office: [phone] Pager: [phone]  09/16/2018 10:59 AM

## 2018-09-17 ENCOUNTER — Encounter (HOSPITAL_COMMUNITY): Payer: Self-pay | Admitting: Vascular Surgery

## 2018-09-17 DIAGNOSIS — N2581 Secondary hyperparathyroidism of renal origin: Secondary | ICD-10-CM | POA: Insufficient documentation

## 2018-09-17 LAB — GLUCOSE, CAPILLARY
GLUCOSE-CAPILLARY: 184 mg/dL — AB (ref 70–99)
Glucose-Capillary: 147 mg/dL — ABNORMAL HIGH (ref 70–99)
Glucose-Capillary: 163 mg/dL — ABNORMAL HIGH (ref 70–99)
Glucose-Capillary: 216 mg/dL — ABNORMAL HIGH (ref 70–99)
Glucose-Capillary: 219 mg/dL — ABNORMAL HIGH (ref 70–99)
Glucose-Capillary: 223 mg/dL — ABNORMAL HIGH (ref 70–99)

## 2018-09-17 LAB — RENAL FUNCTION PANEL
Albumin: 2.3 g/dL — ABNORMAL LOW (ref 3.5–5.0)
Anion gap: 9 (ref 5–15)
BUN: 47 mg/dL — AB (ref 6–20)
CO2: 25 mmol/L (ref 22–32)
CREATININE: 6.91 mg/dL — AB (ref 0.61–1.24)
Calcium: 7.5 mg/dL — ABNORMAL LOW (ref 8.9–10.3)
Chloride: 102 mmol/L (ref 98–111)
GFR, EST AFRICAN AMERICAN: 9 mL/min — AB (ref >60)
GFR, EST NON AFRICAN AMERICAN: 8 mL/min — AB (ref >60)
Glucose, Bld: 141 mg/dL — ABNORMAL HIGH (ref 70–99)
Phosphorus: 5.5 mg/dL — ABNORMAL HIGH (ref 2.5–4.6)
Potassium: 3.3 mmol/L — ABNORMAL LOW (ref 3.5–5.1)
Sodium: 136 mmol/L (ref 135–145)

## 2018-09-17 LAB — CBC
HCT: 26.3 % — ABNORMAL LOW (ref 39.0–52.0)
Hemoglobin: 7.6 g/dL — ABNORMAL LOW (ref 13.0–17.0)
MCH: 22.7 pg — ABNORMAL LOW (ref 26.0–34.0)
MCHC: 28.9 g/dL — AB (ref 30.0–36.0)
MCV: 78.5 fL — ABNORMAL LOW (ref 80.0–100.0)
PLATELETS: 231 10*3/uL (ref 150–400)
RBC: 3.35 MIL/uL — ABNORMAL LOW (ref 4.22–5.81)
RDW: 14.5 % (ref 11.5–15.5)
WBC: 14.2 10*3/uL — AB (ref 4.0–10.5)
nRBC: 0.2 % (ref 0.0–0.2)

## 2018-09-17 MED ORDER — VANCOMYCIN HCL 500 MG IV SOLR
500.0000 mg | Freq: Once | INTRAVENOUS | Status: DC
  Filled 2018-09-17: qty 500

## 2018-09-17 MED ORDER — PENTAFLUOROPROP-TETRAFLUOROETH EX AERO: 1.0000 "application " | INHALATION_SPRAY | CUTANEOUS | Status: DC | PRN

## 2018-09-17 MED ORDER — DARBEPOETIN ALFA 200 MCG/0.4ML IJ SOSY: 200.0000 ug | PREFILLED_SYRINGE | INTRAMUSCULAR | Status: DC

## 2018-09-17 MED ORDER — LIDOCAINE HCL (PF) 1 % IJ SOLN: 5.0000 mL | INTRAMUSCULAR | Status: DC | PRN

## 2018-09-17 MED ORDER — OXYCODONE HCL 5 MG PO TABS
ORAL_TABLET | ORAL | Status: AC
  Filled 2018-09-17: qty 2

## 2018-09-17 MED ORDER — HEPARIN SODIUM (PORCINE) 1000 UNIT/ML DIALYSIS: 1000.0000 [IU] | INTRAMUSCULAR | Status: DC | PRN

## 2018-09-17 MED ORDER — HEPARIN SODIUM (PORCINE) 1000 UNIT/ML DIALYSIS: 100.0000 [IU]/kg | INTRAMUSCULAR | Status: DC | PRN

## 2018-09-17 MED ORDER — VANCOMYCIN HCL IN DEXTROSE 500-5 MG/100ML-% IV SOLN
INTRAVENOUS | Status: AC
  Administered 2018-09-17: 500 mg
  Filled 2018-09-17: qty 100

## 2018-09-17 MED ORDER — ALTEPLASE 2 MG IJ SOLR: 2.0000 mg | Freq: Once | INTRAMUSCULAR | Status: DC | PRN

## 2018-09-17 MED ORDER — DARBEPOETIN ALFA 150 MCG/0.3ML IJ SOSY
150.0000 ug | PREFILLED_SYRINGE | Freq: Once | INTRAMUSCULAR | Status: DC
  Filled 2018-09-17: qty 0.3

## 2018-09-17 MED ORDER — ALTEPLASE 2 MG IJ SOLR
INTRAMUSCULAR | Status: AC
  Filled 2018-09-17: qty 4

## 2018-09-17 MED ORDER — SODIUM CHLORIDE 0.9 % IV SOLN: 100.0000 mL | INTRAVENOUS | Status: DC | PRN

## 2018-09-17 MED ORDER — ALTEPLASE 100 MG IV SOLR
4.0000 mg | Freq: Once | INTRAVENOUS | Status: AC
  Administered 2018-09-17: 4 mg
  Filled 2018-09-17: qty 100

## 2018-09-17 MED ORDER — ALTEPLASE 2 MG IJ SOLR
4.0000 mg | Freq: Once | INTRAMUSCULAR | Status: AC
  Administered 2018-09-17: 4 mg

## 2018-09-17 MED ORDER — CARVEDILOL 12.5 MG PO TABS
12.5000 mg | ORAL_TABLET | Freq: Two times a day (BID) | ORAL | Status: DC
  Administered 2018-09-17 – 2018-09-20 (×5): 12.5 mg via ORAL
  Filled 2018-09-17 (×5): qty 1

## 2018-09-17 MED ORDER — LIDOCAINE-PRILOCAINE 2.5-2.5 % EX CREA: 1.0000 "application " | TOPICAL_CREAM | CUTANEOUS | Status: DC | PRN

## 2018-09-17 MED ORDER — VANCOMYCIN HCL IN DEXTROSE 1-5 GM/200ML-% IV SOLN
1000.0000 mg | Freq: Once | INTRAVENOUS | Status: DC
  Filled 2018-09-17: qty 200

## 2018-09-17 NOTE — Procedures (Signed)
I was present at this session.  I have reviewed the session itself and made appropriate changes.  Muxh prob with cath function, will need cath flow.    Jeneen Rinks Susie Ehresman 10/15/20198:45 AM

## 2018-09-17 NOTE — Progress Notes (Signed)
Pt. HD tx started at 0735 today unable to complete HD tx due to increased arterial pressure of 250 and above Dr. Jimmy Footman aware orders to cathflo HD catheter and reinitiate treatment this afternoon with remaining time. Pt. Return to dialysis unit  post cathflo right chest tunneled catheter to reinitiate HD treatment with remaining  time of 3 hours per Dr. Jimmy Footman.  Arterial pressure continues to increase 250 and above. BFR decreased to 250. Dr. Jimmy Footman aware orders to attempt to complete treatment and cathflo catheter overnight.

## 2018-09-17 NOTE — Progress Notes (Signed)
Subjective: Interval History: has no complaint , would like to get home..  Objective: Vital signs in last 24 hours: Temp:  [97.8 F (36.6 C)-98.5 F (36.9 C)] 98.1 F (36.7 C) (10/15 0730) Pulse Rate:  [63-71] 65 (10/15 0815) Resp:  [0-46] 18 (10/15 0730) BP: (115-160)/(63-93) 115/93 (10/15 0815) SpO2:  [93 %-100 %] 98 % (10/15 0730) Weight:  [98.9 kg] 98.9 kg (10/15 0730) Weight change:   Intake/Output from previous day: 10/14 0701 - 10/15 0700 In: 240 [P.O.:240] Out: 870 [Urine:870] Intake/Output this shift: No intake/output data recorded.  General appearance: alert, cooperative and no distress Resp: diminished breath sounds bilaterally Chest wall: RIJ cath Cardio: S1, S2 normal and systolic murmur: systolic ejection 2/6, crescendo and decrescendo at 2nd left intercostal space GI: soft, non-tender; bowel sounds normal; no masses,  no organomegaly Extremities: LUA AVF with incision, L BKA, R TMA with ulceration.  Lab Results: Recent Labs    09/16/18 0532 09/17/18 0730  WBC 15.0* 14.2*  HGB 8.1* 7.6*  HCT 27.5* 26.3*  PLT 226 231   BMET:  Recent Labs    09/16/18 0532 09/17/18 0730  NA 136 136  K 3.4* 3.3*  CL 100 102  CO2 26 25  GLUCOSE 142* 141*  BUN 42* 47*  CREATININE 6.28* 6.91*  CALCIUM 7.2* 7.5*   No results for input(s): PTH in the last 72 hours. Iron Studies: No results for input(s): IRON, TIBC, TRANSFERRIN, FERRITIN in the last 72 hours.  Studies/Results: No results found.  I have reviewed the patient's current medications.  Assessment/Plan: 1 ESRD for HD.  Has CLIPPED and set up for TTS 2 Anemia on ESA/Fe 3 HPTH vi tD  4 DM controlled 5 PVD per VVS 6 Cellulitis improving 10 HTN lower vol P HD, TPA cath, control DM, lower bp meds.   LOS: 8 days   [name] [name] 09/17/2018,8:52 AM

## 2018-09-17 NOTE — Progress Notes (Signed)
Occupational Therapy Treatment Patient Details Name: [name] MRN: [id number] DOB: 10-04-1961 Today's Date: 09/17/2018    History of present illness [name] is a 58 y.o. male with medical history significant of CKD, left below-knee amputation, right transmetatarsal foot amputation, hypertension, diabetes, peripheral neuropathy, hyperlipidemia, admitted with worsening fatigue, weakness, lightheadedness and nausea and vomiting -- with admitting diagnosis of Renal failure, and now starting HD; Also in the past 2 weeks, pt reports an ulcer on R foot, lateral aspect near transmet amputation, as well as L prosthesis more painful, leading to less walking, and functional decline on top of Renal Failure. now s/p Tunneled catheter placement and work on his fistula; plans for vascular intervention to perfuse RLE on afternoon of 10/14   OT comments  Pt demonstrates mod I LB dressing this session. Pt does reports "I need to be careful with this R leg" during session and guarding the R LE slightly. Pt progressing toward OT goals meeting 3 out 5 goals this session. OT to work on goals that require higher balance challenges next session.    Follow Up Recommendations  Home health OT;Outpatient OT    Equipment Recommendations  Wheelchair (measurements OT);Wheelchair cushion (measurements OT);Tub/shower bench    Recommendations for Other Services      Precautions / Restrictions Precautions Precautions: Fall Precaution Comments: Be mindful of pressure on R lateral aspect of foot, and of prosthesis fit/pain Required Braces or Orthoses: Other Brace/Splint Other Brace/Splint: Shoe insert/orthosis R foot; Prosthesis L LE       Mobility Bed Mobility Overal bed mobility: Modified Independent                Transfers     Transfers: Sit to/from Stand Sit to Stand: Supervision         General transfer comment: RW used for stability with transfer. pt does states "i have to be  careful with this R Leg    Balance                                           ADL either performed or assessed with clinical judgement   ADL Overall ADL's : Needs assistance/impaired Eating/Feeding: Independent   Grooming: Wash/dry hands               Lower Body Dressing: Modified independent Lower Body Dressing Details (indicate cue type and reason): able to don L prosthetic and socks without (A). pt even verbalized the need to stand to help with clicking the leg into place Toilet Transfer: Supervision/safety;RW Toilet Transfer Details (indicate cue type and reason): simulated OOB to chair           General ADL Comments: pt eager to each lunch. OT waking the patient on arrival to notify of lunch arrival. pt reports biggest concern is getting a job. 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 Questions ability to progress to around the room ambulation at Supervision level with RW.    Pertinent Vitals/ Pain       Pain Assessment: No/denies pain  Home Living                                          Prior Functioning/Environment              Frequency  Min 2X/week        Progress Toward Goals  OT Goals(current goals can now be found in the care plan section)  Progress towards OT goals: Progressing toward goals  Acute Rehab OT Goals Patient Stated Goal: return to work OT Goal Formulation: With patient Time For Goal Achievement: 09/25/18 Potential to Achieve Goals: Good ADL Goals Pt Will Transfer to Toilet: with min guard assist;stand pivot  transfer;bedside commode Pt Will Perform Tub/Shower Transfer: Tub transfer;with min guard assist;ambulating;tub bench;rolling walker  Plan Discharge plan remains appropriate    Co-evaluation                 AM-PAC PT "6 Clicks" Daily Activity     Outcome Measure   Help from another person eating meals?: None Help from another person taking care of personal grooming?: A Little Help from another person toileting, which includes using toliet, bedpan, or urinal?: A Little Help from another person bathing (including washing, rinsing, drying)?: A Lot Help from another person to put on and taking off regular upper body clothing?: None Help from another person to put on and taking off regular lower body clothing?: A Little 6 Click Score: 19    End of Session Equipment Utilized During Treatment: Gait belt  OT Visit Diagnosis: Unsteadiness on feet (R26.81);Muscle weakness (generalized) (M62.81)   Activity Tolerance Patient tolerated treatment well   Patient Left in chair;with call bell/phone within reach   Nurse Communication Mobility status;Precautions        Time: 3474-2595 OT Time Calculation (min): 17 min  Charges: OT General Charges $OT Visit: 1 Visit OT Treatments $Self Care/Home Management : 8-22 mins   Jeri Modena, OTR/L  Acute Rehabilitation Services Pager: [phone] Office: [phone] .    Parke Poisson B 09/17/2018, 2:51 PM

## 2018-09-17 NOTE — Progress Notes (Addendum)
Nutrition Follow Up  DOCUMENTATION CODES:   Not applicable  INTERVENTION:    Nepro Shake po BID, each supplement provides 425 kcal and 19 grams protein  RENA-VIT po daily  NUTRITION DIAGNOSIS:   Increased nutrient needs related to acute illness, wound healing(initiation of dialysis) as evidenced by estimated needs, ongoing   GOAL:   Patient will meet greater than or equal to 90% of their needs, met  MONITOR:   PO intake, Supplement acceptance, Labs, Skin, Weight trends, I & O's  ASSESSMENT:   57 yo male admitted with right food pain with ulceration of lateral aspect of prior R. TMA. Pt also with advanced CKD with probably uremia with plans to initiate HD. PMH includes L. BKA with use of prosthesis, R. transmetatarsal foot amputation, CKD IV with fistula placed in October 2018 (not yet on HD), HTN, DM, HLD   Pt s/p several vascular procedures including R lower extremity angiogram 10/14.  He is currently in HEMODIALYSIS. PO intake excellent at 100% per flowsheet records. Per MAR drinking some of his Nepro Shake supplements.  Medications include Colace, ABX, Lantus and Pepcid. Labs reviewed. K 3.3 (L). Phos 5.5 (H). CBG's [phone].  Diet Order:   Diet Order            Diet renal/carb modified with fluid restriction Diet-HS Snack? Nothing; Fluid restriction: 1200 mL Fluid; Room service appropriate? Yes; Fluid consistency: Thin  Diet effective now             EDUCATION NEEDS:   Education needs have been addressed  Skin:  Skin Assessment: Skin Integrity Issues: Skin Integrity Issues:: Other (Comment) Other: ulceration of latera aspect of prior Right TMA  Last BM:  10/13  Height:   Ht Readings from Last 1 Encounters:  09/09/18 6' 4" (1.93 m)   Weight:   Wt Readings from Last 1 Encounters:  09/17/18 99.1 kg    BMI:  28.7 kg/m2 >> adjusted for amputations   Estimated Nutritional Needs:   Kcal:  2400-2600  Protein:  120-130 gm  Fluid:  1,000 ml +  UOP  Katie , RD, LDN Pager #: [phone] After-Hours Pager #: [phone]   

## 2018-09-17 NOTE — Progress Notes (Addendum)
  Progress Note    09/17/2018 9:46 AM 1 Day Post-Op  Subjective:  Pain in R foot overnight   Vitals:   09/17/18 0845 09/17/18 0919  BP: 118/65   Pulse: 70 76  Resp: 16 15  Temp: 98 F (36.7 C) 98.4 F (36.9 C)  SpO2:  95%   Physical Exam: Lungs:  Non labored Incisions:  L groin cath site without hematoma or continued bleeding; R DP access site without continued bleeding; ATA monophasic signal at proximal ankle Extremities: dressing left in place lateral R foot Abdomen:  Soft Neurologic: A&O  CBC    Component Value Date/Time   WBC 14.2 (H) 09/17/2018 0730   RBC 3.35 (L) 09/17/2018 0730   HGB 7.6 (L) 09/17/2018 0730   HCT 26.3 (L) 09/17/2018 0730   PLT 231 09/17/2018 0730   MCV 78.5 (L) 09/17/2018 0730   MCH 22.7 (L) 09/17/2018 0730   MCHC 28.9 (L) 09/17/2018 0730   RDW 14.5 09/17/2018 0730   LYMPHSABS 1.3 09/10/2018 0034   MONOABS 1.5 (H) 09/10/2018 0034   EOSABS 0.1 09/10/2018 0034   BASOSABS 0.1 09/10/2018 0034    BMET    Component Value Date/Time   NA 136 09/17/2018 0730   K 3.3 (L) 09/17/2018 0730   CL 102 09/17/2018 0730   CO2 25 09/17/2018 0730   GLUCOSE 141 (H) 09/17/2018 0730   BUN 47 (H) 09/17/2018 0730   CREATININE 6.91 (H) 09/17/2018 0730   CALCIUM 7.5 (L) 09/17/2018 0730   GFRNONAA 8 (L) 09/17/2018 0730   GFRAA 9 (L) 09/17/2018 0730    INR    Component Value Date/Time   INR 1.17 03/31/2017 0004     Intake/Output Summary (Last 24 hours) at 09/17/2018 0946 Last data filed at 09/17/2018 0414 Gross per 24 hour  Intake 0 ml  Output 870 ml  Net -870 ml     Assessment/Plan:  57 y.o. male is s/p RLE arteriogram with balloon angioplasty of ATA 1 Day Post-Op   R ATA doppler signal at proximal ankle s/p angioplasty of ATA Continue current wound care R foot L arm fistula incision healing well ESRD today per Nephrology [facility] for discharge from vascular standpoint; office will arrange follow up with Dr. Rolanda Lundborg,  PA-C [facility] [phone] 09/17/2018 9:46 AM  I have independently interviewed and examined patient and agree with PA assessment and plan above.  Patient has had everything possible to obtain blood flow to his right foot but unsure this is going to be enough to heal his wound.  He would need below-knee amputation if he cannot heal his TMA site.  We will follow him in the office for his wound as well as recent revision of left upper extremity AV fistula with tunneled catheter placement.  Tim Corriher C. Donzetta Matters, MD [facility] [place] Office: [phone] Pager: [phone]

## 2018-09-17 NOTE — Progress Notes (Signed)
PROGRESS NOTE    [name]  VPX:[id number] DOB: 07/30/1961 DOA: 09/09/2018 PCP: System, Pcp Not In    Brief Narrative:  57 y.o.malewith medical history significant of advanced CKD, left below-knee amputation, right transmetatarsal foot amputation, hypertension, diabetes, peripheral neuropathy, hyperlipidemia, tobacco abusenow in remission.  Presented with1 week history of right foot pressure worse in the lateral aspect of her right foot and purulent drainage he noticed skin breakdown of the foot about 2 weeks ago. Patient reports he has decreased sensation in his lower extremities and unable to feel pain.  Cr greater than 6. Still makes urine Last time had seen nephrology was 6 m ago. Had Left AC fistula placed in October 2018.  09/10/2018: Patient seen and examined at his bedside.  Reports significant pain in his right stump.  Pain medication in place as needed.  Orthopedic surgery consulted.  ABI done which revealed moderate peripheral artery disease.  Vascular surgery consulted for possible angiogram and intervention.  Assessment & Plan:   Active Problems:   Anemia of chronic disease   AKI (acute kidney injury) ([place])   Essential hypertension   DM type 2 causing CKD stage 4 (HCC)   Unilateral complete BKA, left, sequela (HCC)   Chronic kidney disease (CKD), stage IV (severe) (HCC)   Type 2 diabetes mellitus with peripheral neuropathy (HCC)   Hyperlipidemia   Diabetic foot ulcer (HCC)   Hypocalcemia   Dehydration  Right foot ulcer -Positive ABI with moderate disease -Positive for MRSA screening -Elevated sed rate and CRP noted -R foot MRI done, findings of tiny presumed soft tissue abscess  -Vascular surgery following, s/p aortogram of LE and fistuologram UE -Pt underwent revision of L arm AV fistula tunneled cath 10/11 now s/p revascularization of RLE 10/14 -Continued on empiric on IV Rocephin and IV Flagyl -Vanc was initially added due to positive MRSA  screening -Blood cultures remain negative. Completed over one week of abx. Remains afebrile. Will d/c further abx  Moderate peripheral artery disease -Continue aspirin and Lipitor -Vascular surgery consulted and pt s/p fistulogram and aortogram -s/p AV fistula revision, s/p revascularization of RLE 10/14  Severe anion gap metabolic acidosis most likely secondary to renal failure versus others -Now on HD, clip in progress -Continue with HD as tolerated  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 -Nephrology consulted and following -s/p fistuogram, pt is now s/p revison of L arm AV fistula 10/11 -Patient tolerated HD -CLIP currently in progress  Severe hypomagnesemia -Mg was replaced, labs reviewed  Hyperphosphatemia -Recheck bmet in AM -Currently stable  Hypertension -BP stable at present, albeit suboptimally controlled -Continue with amlodipine, carvedilol as patient tolerates  Constipation -Resolved with lactulose  DVT prophylaxis: Heparin subq Code Status: Full Family Communication: Pt in room, family not at bedside Disposition Plan: Uncertain at this time  Consultants:   Vascular surgery  Orthopedic surgery  Nephrology  Procedures:   L arm AV fistula tunneled cath 10/11 now s/p revascularization of RLE 10/14  Antimicrobials: Anti-infectives (From admission, onward)   Start     Dose/Rate Route Frequency Ordered Stop   09/17/18 1547  vancomycin (VANCOCIN) 500-5 MG/100ML-% IVPB    Note to Pharmacy:  Almira Bar   : cabinet override      09/17/18 1547 09/17/18 1551   09/17/18 1400  vancomycin (VANCOCIN) IVPB 1000 mg/200 mL premix  Status:  Discontinued     1,000 mg 200 mL/hr over 60 Minutes Intravenous  Once 09/17/18 1109 09/17/18 1120   09/17/18 1400  vancomycin (VANCOCIN) 500 mg  in sodium chloride 0.9 % 100 mL IVPB     500 mg 100 mL/hr over 60 Minutes Intravenous  Once 09/17/18 1120     09/15/18 1410  vancomycin variable dose per unstable renal function (pharmacist  dosing)      Does not apply See admin instructions 09/15/18 1410     09/14/18 1800  vancomycin (VANCOCIN) IVPB 750 mg/150 ml premix     750 mg 150 mL/hr over 60 Minutes Intravenous  Once 09/14/18 1750 09/15/18 1636   09/14/18 0345  vancomycin (VANCOCIN) IVPB 1000 mg/200 mL premix     1,000 mg 200 mL/hr over 60 Minutes Intravenous  Once 09/14/18 0337 09/14/18 0613   09/13/18 1500  ceFAZolin (ANCEF) IVPB 2g/100 mL premix     2 g 200 mL/hr over 30 Minutes Intravenous To ShortStay Procedural 09/12/18 1516 09/13/18 1335   09/12/18 1426  vancomycin variable dose per unstable renal function (pharmacist dosing)  Status:  Discontinued      Does not apply See admin instructions 09/12/18 1426 09/13/18 1646   09/10/18 0030  cefTRIAXone (ROCEPHIN) 2 g in sodium chloride 0.9 % 100 mL IVPB     2 g 200 mL/hr over 30 Minutes Intravenous Daily at bedtime 09/09/18 2344     09/10/18 0000  metroNIDAZOLE (FLAGYL) IVPB 500 mg     500 mg 100 mL/hr over 60 Minutes Intravenous Every 8 hours 09/09/18 2344     09/09/18 2115  piperacillin-tazobactam (ZOSYN) IVPB 3.375 g  Status:  Discontinued     3.375 g 100 mL/hr over 30 Minutes Intravenous  Once 09/09/18 2100 09/09/18 2108   09/09/18 2115  piperacillin-tazobactam (ZOSYN) IVPB 2.25 g     2.25 g 100 mL/hr over 30 Minutes Intravenous  Once 09/09/18 2108 09/09/18 2207   09/09/18 2115  vancomycin (VANCOCIN) 2,000 mg in sodium chloride 0.9 % 500 mL IVPB     2,000 mg 250 mL/hr over 120 Minutes Intravenous  Once 09/09/18 2109 09/09/18 2334      Subjective: No complaints at this time  Objective: Vitals:   09/17/18 1430 09/17/18 1500 09/17/18 1530 09/17/18 1600  BP: (!) 130/59 117/60 134/61 134/60  Pulse: 76 74 78 76  Resp:      Temp:      TempSrc:      SpO2:      Weight:      Height:        Intake/Output Summary (Last 24 hours) at 09/17/2018 1613 Last data filed at 09/17/2018 1222 Gross per 24 hour  Intake 120 ml  Output 1325 ml  Net -1205 ml    Filed Weights   09/14/18 1619 09/17/18 0730 09/17/18 1335  Weight: 97.9 kg 98.9 kg 99.1 kg    Examination: General exam: Conversant, in no acute distress Respiratory system: normal chest rise, clear, no audible wheezing   Data Reviewed: I have personally reviewed following labs and imaging studies  CBC: Recent Labs  Lab 09/11/18 0455 09/12/18 0525 09/14/18 0019 09/16/18 0532 09/17/18 0730  WBC 10.2 11.7* 13.1* 15.0* 14.2*  HGB 8.1* 8.1* 7.1* 8.1* 7.6*  HCT 26.1* 26.9* 24.9* 27.5* 26.3*  MCV 73.3* 74.7* 77.6* 77.2* 78.5*  PLT 235 250 203 226 185   Basic Metabolic Panel: Recent Labs  Lab 09/11/18 0455 09/12/18 0525 09/14/18 0020 09/16/18 0532 09/17/18 0730  NA 139 139 135 136 136  K 2.9* 3.5 3.5 3.4* 3.3*  CL 105 103 102 100 102  CO2 21* 21* 19* 26 25  GLUCOSE 144*  155* 178* 142* 141*  BUN 81* 87* 92* 42* 47*  CREATININE 6.88* 7.84* 9.43* 6.28* 6.91*  CALCIUM 6.3* 6.7* 6.7* 7.2* 7.5*  MG 1.4* 2.6*  --   --   --   PHOS 5.5*  --  8.0*  --  5.5*   GFR: Estimated Creatinine Clearance: 14.1 mL/min (A) (by C-G formula based on SCr of 6.91 mg/dL (H)). Liver Function Tests: Recent Labs  Lab 09/10/18 1930 09/14/18 0020 09/17/18 0730  ALT 24  --   --   ALBUMIN  --  2.3* 2.3*   No results for input(s): LIPASE, AMYLASE in the last 168 hours. No results for input(s): AMMONIA in the last 168 hours. Coagulation Profile: No results for input(s): INR, PROTIME in the last 168 hours. Cardiac Enzymes: No results for input(s): CKTOTAL, CKMB, CKMBINDEX, TROPONINI in the last 168 hours. BNP (last 3 results) No results for input(s): PROBNP in the last 8760 hours. HbA1C: No results for input(s): HGBA1C in the last 72 hours. CBG: Recent Labs  Lab 09/16/18 2001 09/16/18 2358 09/17/18 0411 09/17/18 0928 09/17/18 1142  GLUCAP 132* 246* 163* 147* 184*   Lipid Profile: No results for input(s): CHOL, HDL, LDLCALC, TRIG, CHOLHDL, LDLDIRECT in the last 72 hours. Thyroid  Function Tests: No results for input(s): TSH, T4TOTAL, FREET4, T3FREE, THYROIDAB in the last 72 hours. Anemia Panel: No results for input(s): VITAMINB12, FOLATE, FERRITIN, TIBC, IRON, RETICCTPCT in the last 72 hours. Sepsis Labs: No results for input(s): PROCALCITON, LATICACIDVEN in the last 168 hours.  Recent Results (from the past 240 hour(s))  Blood Cultures x 2 sites     Status: None   Collection Time: 09/10/18 12:30 AM  Result Value Ref Range Status   Specimen Description BLOOD LEFT ARM  Final   Special Requests   Final    BOTTLES DRAWN AEROBIC AND ANAEROBIC Blood Culture adequate volume   Culture   Final    NO GROWTH 5 DAYS Performed at [facility], [address] [place], [place] [address]    Report Status 09/15/2018 FINAL  Final  Blood Cultures x 2 sites     Status: None   Collection Time: 09/10/18 12:34 AM  Result Value Ref Range Status   Specimen Description BLOOD LEFT HAND  Final   Special Requests   Final    BOTTLES DRAWN AEROBIC ONLY Blood Culture adequate volume   Culture   Final    NO GROWTH 5 DAYS Performed at [facility], [place] [address] [place], [place] [address]    Report Status 09/15/2018 FINAL  Final  MRSA PCR Screening     Status: Abnormal   Collection Time: 09/10/18  8:51 AM  Result Value Ref Range Status   MRSA by PCR POSITIVE (A) NEGATIVE Final    Comment:        The GeneXpert MRSA Assay (FDA approved for NASAL specimens only), is one component of a comprehensive MRSA colonization surveillance program. It is not intended to diagnose MRSA infection nor to guide or monitor treatment for MRSA infections. RESULT CALLED TO, READ BACK BY AND VERIFIED WITH: RN Chapman Moss [phone] [id number] Performed at [facility] [address], [place], [place] [address]      Radiology Studies: No results found.  Scheduled Meds: . alteplase      . alteplase  4 mg Intracatheter Once  . amLODipine  10 mg Oral QHS  . aspirin EC  81 mg Oral Daily   .  atorvastatin  10 mg Oral QHS  . carvedilol  12.5 mg Oral BID WC  . Chlorhexidine Gluconate Cloth  6 each Topical Q0600  . Chlorhexidine Gluconate Cloth  6 each Topical Q0600  . clopidogrel  75 mg Oral Daily  . [START ON 09/20/2018] darbepoetin (ARANESP) injection - DIALYSIS  150 mcg Intravenous Once  . [START ON 09/27/2018] darbepoetin (ARANESP) injection - DIALYSIS  200 mcg Intravenous Q Fri-HD  . docusate sodium  100 mg Oral BID  . feeding supplement (NEPRO CARB STEADY)  237 mL Oral BID BM  . gabapentin  300 mg Oral QHS  . insulin aspart  0-9 Units Subcutaneous Q4H  . insulin glargine  10 Units Subcutaneous BID  . multivitamin  1 tablet Oral QHS  . mupirocin ointment   Nasal BID  . oxyCODONE      . pantoprazole  40 mg Oral Daily  . polyethylene glycol  17 g Oral Daily  . sodium chloride flush  3 mL Intravenous Q12H  . sodium chloride flush  3 mL Intravenous Q12H  . vancomycin variable dose per unstable renal function (pharmacist dosing)   Does not apply See admin instructions   Continuous Infusions: . sodium chloride    . sodium chloride    . sodium chloride Stopped (09/16/18 0612)  . sodium chloride    . sodium chloride    . cefTRIAXone (ROCEPHIN)  IV 2 g (09/16/18 2135)   And  . metronidazole 500 mg (09/17/18 1217)  . famotidine (PEPCID) IV    . ferric gluconate (FERRLECIT/NULECIT) IV 125 mg (09/17/18 1551)  . vancomycin       LOS: 8 days   Marylu Lund, MD [facility] Pager On Amion  If 7PM-7AM, please contact night-coverage 09/17/2018, 4:13 PM

## 2018-09-17 NOTE — Progress Notes (Addendum)
Pharmacy Antibiotic Note  [name] is a 57 y.o. male admitted on 09/09/2018 with right foot pain and previous left BKA.  Pharmacy has been consulted for vancomycin dosing for right foot ulcer and possible osteomyelitis. 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 He is also on ceftriaxone and Flagyl. 10/12 Random vancomycin level 25 pre-HD (pre afternoon session - pt was dialyzed twice on 10/12) and Vancomycin 750mg  IV given that evening post second HD session.   Pt went to HD today 10/15, BFR 350 x ~2 hours.  Plan: Vancomycin 500mg  IV x 1 today.  Further doses once dialysis schedule is known if pt continues on Vancomycin   Height: 6\' 4"  (193 cm) Weight: 218 lb 0.6 oz (98.9 kg) IBW/kg (Calculated) : 86.8  Temp (24hrs), Avg:98.2 F (36.8 C), Min:97.8 F (36.6 C), Max:98.5 F (36.9 C)  Recent Labs  Lab 09/11/18 0455 09/12/18 0525 09/14/18 0019 09/14/18 0020 09/14/18 1222 09/16/18 0532 09/17/18 0730  WBC 10.2 11.7* 13.1*  --   --  15.0* 14.2*  CREATININE 6.88* 7.84*  --  9.43*  --  6.28* 6.91*  VANCORANDOM  --   --   --   --  25  --   --     Estimated Creatinine Clearance: 14.1 mL/min (A) (by C-G formula based on SCr of 6.91 mg/dL (H)).    No Known Allergies  Antimicrobials this admission: Vancomycin 10/7 >> Zosyn 2.25 g x1 10/7 Ceftriaxone 10/8 >>  Flagyl 10/8 >>   Microbiology results: 10/8 BCx: negative 10/8 MRSA PCR: positive  Sherlon Handing, PharmD, BCPS Clinical pharmacist  **Pharmacist phone directory can now be found on [email or web address] ([email or web address]).  Listed under [place]. 09/17/2018 11:10 AM

## 2018-09-17 NOTE — Progress Notes (Signed)
OT Cancellation Note  Patient Details Name: [name] MRN: [id number] DOB: Jun 04, 1961   Cancelled Treatment:    Reason Eval/Treat Not Completed: Patient at procedure or test/ unavailable(HD)  Parke Poisson B 09/17/2018, 8:14 AM   Jeri Modena, OTR/L  Acute Rehabilitation Services Pager: [phone] Office: [phone] .

## 2018-09-18 ENCOUNTER — Telehealth: Payer: Self-pay | Admitting: Vascular Surgery

## 2018-09-18 DIAGNOSIS — D688 Other specified coagulation defects: Secondary | ICD-10-CM | POA: Insufficient documentation

## 2018-09-18 LAB — GLUCOSE, CAPILLARY
GLUCOSE-CAPILLARY: 216 mg/dL — AB (ref 70–99)
GLUCOSE-CAPILLARY: 183 mg/dL — AB (ref 70–99)
Glucose-Capillary: 257 mg/dL — ABNORMAL HIGH (ref 70–99)
Glucose-Capillary: 223 mg/dL — ABNORMAL HIGH (ref 70–99)
Glucose-Capillary: 154 mg/dL — ABNORMAL HIGH (ref 70–99)

## 2018-09-18 MED ORDER — INSULIN ASPART 100 UNIT/ML ~~LOC~~ SOLN
0.0000 [IU] | Freq: Three times a day (TID) | SUBCUTANEOUS | Status: DC
  Administered 2018-09-19 (×2): 3 [IU] via SUBCUTANEOUS
  Administered 2018-09-20: 2 [IU] via SUBCUTANEOUS
  Administered 2018-09-20: 1 [IU] via SUBCUTANEOUS

## 2018-09-18 NOTE — Progress Notes (Signed)
[facility] [address]1st treatment Tuesday,October 15,2019 at 11:15am .Schedule Tuesday,Thursday,Saturday at 11:15am

## 2018-09-18 NOTE — Progress Notes (Signed)
PT Cancellation Note  Patient Details Name: [name] MRN: [id number] DOB: 1961/10/10   Cancelled Treatment:    Reason Eval/Treat Not Completed: Patient declined, no reason specified. States "I was just up," and deferring out of bed mobility secondary to pain. Will follow.  Ellamae Sia, PT, DPT Acute Rehabilitation Services Pager [phone] Office [phone]    [name] 09/18/2018, 4:40 PM

## 2018-09-18 NOTE — Progress Notes (Signed)
Subjective: Interval History: has no complaint, ready to go homoe.  Objective: Vital signs in last 24 hours: Temp:  [98 F (36.7 C)-99.9 F (37.7 C)] 98.4 F (36.9 C) (10/16 0412) Pulse Rate:  [69-78] 72 (10/16 0412) Resp:  [15-18] 18 (10/16 0412) BP: (117-159)/(59-79) 129/65 (10/16 0412) SpO2:  [95 %-99 %] 97 % (10/16 0412) Weight:  [95.8 kg-99.1 kg] 95.8 kg (10/15 1641) Weight change:   Intake/Output from previous day: 10/15 0701 - 10/16 0700 In: 268.5 [P.O.:120; I.V.:12; NG/GT:120; IV Piggyback:16.5] Out: 3455  Intake/Output this shift: No intake/output data recorded.  General appearance: alert, cooperative and no distress Resp: clear to auscultation bilaterally Chest wall: RIJ cath Cardio: S1, S2 normal and systolic murmur: systolic ejection 2/6, crescendo and decrescendo at 2nd left intercostal space GI: soft, non-tender; bowel sounds normal; no masses,  no organomegaly Extremities: AVF LUA with incison, R BKA, L TMA with ulcer  Lab Results: Recent Labs    09/16/18 0532 09/17/18 0730  WBC 15.0* 14.2*  HGB 8.1* 7.6*  HCT 27.5* 26.3*  PLT 226 231   BMET:  Recent Labs    09/16/18 0532 09/17/18 0730  NA 136 136  K 3.4* 3.3*  CL 100 102  CO2 26 25  GLUCOSE 142* 141*  BUN 42* 47*  CREATININE 6.28* 6.91*  CALCIUM 7.2* 7.5*   No results for input(s): PTH in the last 72 hours. Iron Studies: No results for input(s): IRON, TIBC, TRANSFERRIN, FERRITIN in the last 72 hours.  Studies/Results: No results found.  I have reviewed the patient's current medications.  Assessment/Plan: 1 ESRD cath issues, but can handle outpatient, Has spot at [facility] TTS PM 2 Anemia esa 3 PVD per VVS 4 HPTH vit D 5 DM controlled 6 HTN lower vol lower meds 7 cellulitis P ok to d/c from my standpoint. Let us know if AB needs,     LOS: 9 days   Jeneen Rinks Darlys Buis 09/18/2018,8:53 AM

## 2018-09-18 NOTE — Telephone Encounter (Signed)
sch appt lvm 10/25/18 3pm Dialysis Duplex 4pm p/o PA

## 2018-09-18 NOTE — Care Management Note (Signed)
Case Management Note [name] Gibbons RN, BSN Transitions of Care Unit 4E- RN Case Manager [phone]  Patient Details  Name: [name] MRN: [id number] Date of Birth: January 25, 1961  Subjective/Objective:   Pt admitted with DM foot ulcer, s/p aortogram of LE and fistuologram UE -Pt underwent revision of L arm AV fistula tunneled cath 10/11 now s/p revascularization of RLE 10/14, pt is new HD pt and has been clipped for outpt spot TTS per renal note.                  Action/Plan: PTA pt lived at home with spouse, hx left BKA, right TMA. 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 Pt reports that he has medications at home including his Lantus insulin (about 1 mo supply). He also reports that he last saw a PCP in March when he had insurance- [facility]- Pt would prefer to go back there since he knows them- informed pt that they do see pt's that have no insurance. Instructed pt to go ahead an call for an appointment and also ask for eligibility appointment for sliding scale fees. Pt believes the PCP name was [name]. CM will f/u with pt once scripts done for transition home, pt is not interested in having meds filled here as he does not have ability to pay for them. He would prefer to have paper scripts to take with him. CM will follow  Expected Discharge Date:                  Expected Discharge Plan:  Home/Self Care  In-House Referral:     Discharge planning Services  CM Consult, Medication Assistance, [facility]  Post Acute Care Choice:    Choice offered to:     DME Arranged:    DME Agency:     HH Arranged:    HH Agency:     Status of Service:  In process, will continue to follow  If discussed at Long Length of Stay Meetings, dates discussed:     Discharge Disposition: home/self care   Additional Comments:  [name] Patricia, RN 09/18/2018, 10:29 AM

## 2018-09-18 NOTE — Telephone Encounter (Signed)
-----   Message from Dagoberto Ligas, PA-C sent at 09/17/2018  4:26 PM EDT ----- Can you schedule an appt with fistula duplex and ABI in 4-6 weeks on PA clinic on Dr. Donzetta Matters office day.  PO L arm fistula revision and R ATA angioplasty. Thanks, [facility]

## 2018-09-19 LAB — GLUCOSE, CAPILLARY
GLUCOSE-CAPILLARY: 204 mg/dL — AB (ref 70–99)
GLUCOSE-CAPILLARY: 221 mg/dL — AB (ref 70–99)
GLUCOSE-CAPILLARY: 143 mg/dL — AB (ref 70–99)
Glucose-Capillary: 228 mg/dL — ABNORMAL HIGH (ref 70–99)

## 2018-09-19 LAB — CBC
HCT: 28.2 % — ABNORMAL LOW (ref 39.0–52.0)
HEMOGLOBIN: 7.9 g/dL — AB (ref 13.0–17.0)
MCH: 22.3 pg — AB (ref 26.0–34.0)
MCHC: 28 g/dL — ABNORMAL LOW (ref 30.0–36.0)
MCV: 79.7 fL — ABNORMAL LOW (ref 80.0–100.0)
PLATELETS: 248 10*3/uL (ref 150–400)
RBC: 3.54 MIL/uL — ABNORMAL LOW (ref 4.22–5.81)
RDW: 14.2 % (ref 11.5–15.5)
WBC: 14.6 10*3/uL — AB (ref 4.0–10.5)
nRBC: 0.1 % (ref 0.0–0.2)

## 2018-09-19 LAB — POTASSIUM: POTASSIUM: 3.8 mmol/L (ref 3.5–5.1)

## 2018-09-19 LAB — SEDIMENTATION RATE: Sed Rate: 87 mm/hr — ABNORMAL HIGH (ref 0–16)

## 2018-09-19 LAB — LACTIC ACID, PLASMA
LACTIC ACID, VENOUS: 1.2 mmol/L (ref 0.5–1.9)
Lactic Acid, Venous: 1.2 mmol/L (ref 0.5–1.9)

## 2018-09-19 LAB — PROCALCITONIN: PROCALCITONIN: 0.98 ng/mL

## 2018-09-19 MED ORDER — HEPARIN SODIUM (PORCINE) 1000 UNIT/ML IJ SOLN
INTRAMUSCULAR | Status: AC
  Administered 2018-09-19: 3400 [IU]
  Filled 2018-09-19: qty 4

## 2018-09-19 MED ORDER — HEPARIN SODIUM (PORCINE) 5000 UNIT/ML IJ SOLN
5000.0000 [IU] | Freq: Three times a day (TID) | INTRAMUSCULAR | Status: DC
  Administered 2018-09-19 – 2018-09-20 (×2): 5000 [IU] via SUBCUTANEOUS
  Filled 2018-09-19 (×2): qty 1

## 2018-09-19 MED ORDER — HYDROMORPHONE HCL 1 MG/ML IJ SOLN: 0.5000 mg | INTRAMUSCULAR | Status: DC | PRN

## 2018-09-19 MED ORDER — HEPARIN SODIUM (PORCINE) 1000 UNIT/ML DIALYSIS: 100.0000 [IU]/kg | INTRAMUSCULAR | Status: DC | PRN

## 2018-09-19 MED ORDER — LIDOCAINE HCL (PF) 1 % IJ SOLN: 5.0000 mL | INTRAMUSCULAR | Status: DC | PRN

## 2018-09-19 MED ORDER — SODIUM CHLORIDE 0.9 % IV SOLN: 100.0000 mL | INTRAVENOUS | Status: DC | PRN

## 2018-09-19 MED ORDER — LIDOCAINE-PRILOCAINE 2.5-2.5 % EX CREA: 1.0000 "application " | TOPICAL_CREAM | CUTANEOUS | Status: DC | PRN

## 2018-09-19 MED ORDER — CHLORHEXIDINE GLUCONATE CLOTH 2 % EX PADS
6.0000 | MEDICATED_PAD | Freq: Every day | CUTANEOUS | Status: DC
  Administered 2018-09-20: 6 via TOPICAL

## 2018-09-19 MED ORDER — PENTAFLUOROPROP-TETRAFLUOROETH EX AERO: 1.0000 "application " | INHALATION_SPRAY | CUTANEOUS | Status: DC | PRN

## 2018-09-19 MED ORDER — HEPARIN SODIUM (PORCINE) 1000 UNIT/ML DIALYSIS: 1000.0000 [IU] | INTRAMUSCULAR | Status: DC | PRN

## 2018-09-19 MED ORDER — ALTEPLASE 2 MG IJ SOLR: 2.0000 mg | Freq: Once | INTRAMUSCULAR | Status: DC | PRN

## 2018-09-19 NOTE — Progress Notes (Signed)
PROGRESS NOTE  MALIKAI GUT KDT:[id number] DOB: 1961-09-01 DOA: 09/09/2018 PCP: System, Pcp Not In  HPI/Recap of past 24 hours:  57 y.o.malewith medical history significant ofadvancedCKD, left below-knee amputation, right transmetatarsal foot amputation, hypertension, diabetes, peripheral neuropathy, hyperlipidemia, tobacco abusenow in remission.Presented with1 week history of right foot pressure worse in the lateral aspect of her right foot and purulent drainage he noticed skin breakdown of the foot about 2 weeks ago.Patient reports he has decreased sensation in his lower extremities and unable to feel pain. Crgreater than 6.Still makes urine Last time had seen nephrology was 6 m ago.Had Left AC fistula placed in October 2018.  09/19/2018: Patient seen and examined at his bedside.  Reports pain in his right stump 9/10 and nonradiating.  DC morphine and added IV Dilaudid 0.5 mg every 4 hours as needed for severe pain.  Avoid morphine in dialysis patient.  Has Oxy IR for moderate pain.  Denies any GI or cardiac symptoms.   Assessment/Plan: Active Problems:   Anemia of chronic disease   AKI (acute kidney injury) ([place])   Essential hypertension   DM type 2 causing CKD stage 4 (HCC)   Unilateral complete BKA, left, sequela (HCC)   Chronic kidney disease (CKD), stage IV (severe) (HCC)   Type 2 diabetes mellitus with peripheral neuropathy (HCC)   Hyperlipidemia   Diabetic foot ulcer (HCC)   Hypocalcemia   Dehydration  Right lower extremity cellulitis with early changes of osteomyelitis on MRI right foot without contrast Completed 9 days of IV antibiotics empirically IV vancomycin, IV Flagyl, IV Rocephin Blood cultures negative x2 from 09/10/2018 Afebrile Antibiotics DC'd on 09/17/2018  Persistent leukocytosis Possibly reactive Rule out active infective process Obtain lactic acid, procalcitonin, sed rate Sed rate should trend down  Moderate peripheral artery  disease Vascular surgery consulted and following Status post revascularization of right lower extremity 09/16/2018 Continue aspirin Plavix and Lipitor  New end-stage renal disease on dialysis Nephrology consulted and following Per nephrology has HD spot HD completed today and tolerated well  Anemia of chronic disease/iron deficiency anemia IV iron supplement obtain during dialysis Hemoglobin 7.9 No prior history of coronary artery disease or active cardiac symptoms Goal hemoglobin greater than 7  Hypokalemia Obtain potassium Replete as indicated  Ambulatory dysfunction due to amputation PT to assess in the morning Fall precautions  Hypertension Blood pressures well controlled Resume blood pressure medications IV hydralazine as needed  Type 2 diabetes Last hemoglobin A1c 7.5 on 09/10/2018 Continue insulin sliding scale Avoid hypoglycemia   Code Status: Full code  Family Communication: None at bedside  Disposition Plan: Home possibly tomorrow 09/20/18   Consultants:  Nephrology  Vascular surgery  Orthopedic surgery  Procedures:  Left arm AV fistula tunneled cath 09/13/2018  Revascularization of right lower extremity 09/16/2018  Antimicrobials:  None  DVT prophylaxis: Subcu heparin 5000 units 3 times daily   Objective: Vitals:   09/19/18 1500 09/19/18 1530 09/19/18 1600 09/19/18 1619  BP: 128/72 126/60 133/71 140/65  Pulse: 75 73 72 71  Resp:    18  Temp:    98.6 F (37 C)  TempSrc:    Oral  SpO2:      Weight:    95.6 kg  Height:        Intake/Output Summary (Last 24 hours) at 09/19/2018 1815 Last data filed at 09/19/2018 1700 Gross per 24 hour  Intake 480 ml  Output 2500 ml  Net -2020 ml   Filed Weights   09/17/18 1641 09/19/18  1146 09/19/18 1619  Weight: 95.8 kg 98.2 kg 95.6 kg    Exam:  . General: 57 y.o. year-old male well developed well nourished in no acute distress.  Alert and oriented x3. . Cardiovascular: Regular rate  and rhythm with no rubs or gallops.  No thyromegaly or JVD noted.   Marland Kitchen Respiratory: Clear to auscultation with no wheezes or rales. Good inspiratory effort. . Abdomen: Soft nontender nondistended with normal bowel sounds x4 quadrants. . Psychiatry: Mood is appropriate for condition and setting   Data Reviewed: CBC: Recent Labs  Lab 09/14/18 0019 09/16/18 0532 09/17/18 0730 09/19/18 1430  WBC 13.1* 15.0* 14.2* 14.6*  HGB 7.1* 8.1* 7.6* 7.9*  HCT 24.9* 27.5* 26.3* 28.2*  MCV 77.6* 77.2* 78.5* 79.7*  PLT 203 226 231 836   Basic Metabolic Panel: Recent Labs  Lab 09/14/18 0020 09/16/18 0532 09/17/18 0730  NA 135 136 136  K 3.5 3.4* 3.3*  CL 102 100 102  CO2 19* 26 25  GLUCOSE 178* 142* 141*  BUN 92* 42* 47*  CREATININE 9.43* 6.28* 6.91*  CALCIUM 6.7* 7.2* 7.5*  PHOS 8.0*  --  5.5*   GFR: Estimated Creatinine Clearance: 14.1 mL/min (A) (by C-G formula based on SCr of 6.91 mg/dL (H)). Liver Function Tests: Recent Labs  Lab 09/14/18 0020 09/17/18 0730  ALBUMIN 2.3* 2.3*   No results for input(s): LIPASE, AMYLASE in the last 168 hours. No results for input(s): AMMONIA in the last 168 hours. Coagulation Profile: No results for input(s): INR, PROTIME in the last 168 hours. Cardiac Enzymes: No results for input(s): CKTOTAL, CKMB, CKMBINDEX, TROPONINI in the last 168 hours. BNP (last 3 results) No results for input(s): PROBNP in the last 8760 hours. HbA1C: No results for input(s): HGBA1C in the last 72 hours. CBG: Recent Labs  Lab 09/18/18 1603 09/18/18 2100 09/19/18 0625 09/19/18 1123 09/19/18 1708  GLUCAP 223* 154* 204* 221* 228*   Lipid Profile: No results for input(s): CHOL, HDL, LDLCALC, TRIG, CHOLHDL, LDLDIRECT in the last 72 hours. Thyroid Function Tests: No results for input(s): TSH, T4TOTAL, FREET4, T3FREE, THYROIDAB in the last 72 hours. Anemia Panel: No results for input(s): VITAMINB12, FOLATE, FERRITIN, TIBC, IRON, RETICCTPCT in the last 72  hours. Urine analysis:    Component Value Date/Time   COLORURINE YELLOW 09/10/2018 1927   APPEARANCEUR HAZY (A) 09/10/2018 1927   LABSPEC 1.011 09/10/2018 1927   PHURINE 5.0 09/10/2018 1927   GLUCOSEU 50 (A) 09/10/2018 1927   HGBUR MODERATE (A) 09/10/2018 1927   BILIRUBINUR NEGATIVE 09/10/2018 1927   KETONESUR NEGATIVE 09/10/2018 1927   PROTEINUR 100 (A) 09/10/2018 1927   NITRITE NEGATIVE 09/10/2018 1927   LEUKOCYTESUR NEGATIVE 09/10/2018 1927   Sepsis Labs: @LABRCNTIP (procalcitonin:4,lacticidven:4)  ) Recent Results (from the past 240 hour(s))  Blood Cultures x 2 sites     Status: None   Collection Time: 09/10/18 12:30 AM  Result Value Ref Range Status   Specimen Description BLOOD LEFT ARM  Final   Special Requests   Final    BOTTLES DRAWN AEROBIC AND ANAEROBIC Blood Culture adequate volume   Culture   Final    NO GROWTH 5 DAYS Performed at [facility], [place] [address], [place], [place] [address]    Report Status 09/15/2018 FINAL  Final  Blood Cultures x 2 sites     Status: None   Collection Time: 09/10/18 12:34 AM  Result Value Ref Range Status   Specimen Description BLOOD LEFT HAND  Final   Special  Requests   Final    BOTTLES DRAWN AEROBIC ONLY Blood Culture adequate volume   Culture   Final    NO GROWTH 5 DAYS Performed at [facility], [place] [address] [place], [place] [address]    Report Status 09/15/2018 FINAL  Final  MRSA PCR Screening     Status: Abnormal   Collection Time: 09/10/18  8:51 AM  Result Value Ref Range Status   MRSA by PCR POSITIVE (A) NEGATIVE Final    Comment:        The GeneXpert MRSA Assay (FDA approved for NASAL specimens only), is one component of a comprehensive MRSA colonization surveillance program. It is not intended to diagnose MRSA infection nor to guide or monitor treatment for MRSA infections. RESULT CALLED TO, READ BACK BY AND VERIFIED WITH: RN Chapman Moss [id number] Performed at [facility]  [address] [place], [place] [address]       Studies: No results found.  Scheduled Meds: . amLODipine  10 mg Oral QHS  . aspirin EC  81 mg Oral Daily  . atorvastatin  10 mg Oral QHS  . carvedilol  12.5 mg Oral BID WC  . Chlorhexidine Gluconate Cloth  6 each Topical Q0600  . Chlorhexidine Gluconate Cloth  6 each Topical Q0600  . Chlorhexidine Gluconate Cloth  6 each Topical Q0600  . clopidogrel  75 mg Oral Daily  . [START ON 09/20/2018] darbepoetin (ARANESP) injection - DIALYSIS  150 mcg Intravenous Once  . [START ON 09/27/2018] darbepoetin (ARANESP) injection - DIALYSIS  200 mcg Intravenous Q Fri-HD  . docusate sodium  100 mg Oral BID  . feeding supplement (NEPRO CARB STEADY)  237 mL Oral BID BM  . gabapentin  300 mg Oral QHS  . insulin aspart  0-9 Units Subcutaneous TID WC  . insulin glargine  10 Units Subcutaneous BID  . multivitamin  1 tablet Oral QHS  . mupirocin ointment   Nasal BID  . pantoprazole  40 mg Oral Daily  . polyethylene glycol  17 g Oral Daily  . sodium chloride flush  3 mL Intravenous Q12H  . sodium chloride flush  3 mL Intravenous Q12H    Continuous Infusions: . sodium chloride    . sodium chloride    . sodium chloride Stopped (09/16/18 0612)  . sodium chloride    . sodium chloride    . ferric gluconate (FERRLECIT/NULECIT) IV 125 mg (09/17/18 1551)     LOS: 10 days     Kayleen Memos, MD [facility] Pager [phone]  If 7PM-7AM, please contact night-coverage [email or web address] Password [email or web address] 09/19/2018, 6:15 PM

## 2018-09-19 NOTE — Progress Notes (Signed)
PT Cancellation Note  Patient Details Name: [name] MRN: [id number] DOB: 1961/10/25   Cancelled Treatment:    Reason Eval/Treat Not Completed: Patient at procedure or test/unavailable (HD).  Ellamae Sia, PT, DPT Acute Rehabilitation Services Pager [phone] Office [phone]    [name] 09/19/2018, 3:11 PM

## 2018-09-19 NOTE — Plan of Care (Signed)
  Problem: Health Behavior/Discharge Planning: Goal: Ability to manage health-related needs will improve Outcome: Progressing   Problem: Clinical Measurements: Goal: Ability to maintain clinical measurements within normal limits will improve Outcome: Progressing   

## 2018-09-19 NOTE — Progress Notes (Signed)
Subjective: Interval History: has complaints wants to know when he is to go home.  Objective: Vital signs in last 24 hours: Temp:  [98.7 F (37.1 C)-99 F (37.2 C)] 99 F (37.2 C) (10/17 0438) Pulse Rate:  [72-85] 72 (10/17 0438) Resp:  [18] 18 (10/17 0438) BP: (133-156)/(64-73) 156/69 (10/17 0438) SpO2:  [91 %-97 %] 97 % (10/17 0438) Weight change:   Intake/Output from previous day: 10/16 0701 - 10/17 0700 In: 123 [P.O.:120; I.V.:3] Out: -  Intake/Output this shift: No intake/output data recorded.  General appearance: alert, cooperative and no distress Resp: diminished breath sounds bilaterally Chest wall: RIJ cath. Cardio: S1, S2 normal and systolic murmur: systolic ejection 2/6, crescendo and decrescendo at 2nd left intercostal space GI: soft. liver down 5 cm Extremities: LUA AVF, L BKA  R TMA with ulcer Lab Results: Recent Labs    09/17/18 0730  WBC 14.2*  HGB 7.6*  HCT 26.3*  PLT 231   BMET:  Recent Labs    09/17/18 0730  NA 136  K 3.3*  CL 102  CO2 25  GLUCOSE 141*  BUN 47*  CREATININE 6.91*  CALCIUM 7.5*   No results for input(s): PTH in the last 72 hours. Iron Studies: No results for input(s): IRON, TIBC, TRANSFERRIN, FERRITIN in the last 72 hours.  Studies/Results: No results found.  I have reviewed the patient's current medications.  Assessment/Plan: 1 ESRD will do HD here, since still here.  Has outpatient spot 2 Anemia to get ESA 3 HTN lower with lower vol 4 HPTH vit D 5 PVD 6 Cellulitis on AB 7 Dm controlled P Hd, esa, d/c when ok with primary     LOS: 10 days   Clemence Stillings 09/19/2018,8:46 AM

## 2018-09-20 DIAGNOSIS — L97511 Non-pressure chronic ulcer of other part of right foot limited to breakdown of skin: Secondary | ICD-10-CM

## 2018-09-20 DIAGNOSIS — Z992 Dependence on renal dialysis: Secondary | ICD-10-CM

## 2018-09-20 DIAGNOSIS — N185 Chronic kidney disease, stage 5: Secondary | ICD-10-CM

## 2018-09-20 DIAGNOSIS — I120 Hypertensive chronic kidney disease with stage 5 chronic kidney disease or end stage renal disease: Secondary | ICD-10-CM

## 2018-09-20 DIAGNOSIS — E1142 Type 2 diabetes mellitus with diabetic polyneuropathy: Secondary | ICD-10-CM

## 2018-09-20 DIAGNOSIS — Z8249 Family history of ischemic heart disease and other diseases of the circulatory system: Secondary | ICD-10-CM

## 2018-09-20 DIAGNOSIS — E785 Hyperlipidemia, unspecified: Secondary | ICD-10-CM

## 2018-09-20 DIAGNOSIS — R768 Other specified abnormal immunological findings in serum: Secondary | ICD-10-CM

## 2018-09-20 DIAGNOSIS — Z87891 Personal history of nicotine dependence: Secondary | ICD-10-CM

## 2018-09-20 DIAGNOSIS — Z89512 Acquired absence of left leg below knee: Secondary | ICD-10-CM

## 2018-09-20 DIAGNOSIS — N179 Acute kidney failure, unspecified: Secondary | ICD-10-CM

## 2018-09-20 DIAGNOSIS — R0602 Shortness of breath: Secondary | ICD-10-CM | POA: Insufficient documentation

## 2018-09-20 DIAGNOSIS — E11621 Type 2 diabetes mellitus with foot ulcer: Secondary | ICD-10-CM

## 2018-09-20 DIAGNOSIS — Z833 Family history of diabetes mellitus: Secondary | ICD-10-CM

## 2018-09-20 DIAGNOSIS — T829XXA Unspecified complication of cardiac and vascular prosthetic device, implant and graft, initial encounter: Secondary | ICD-10-CM | POA: Insufficient documentation

## 2018-09-20 DIAGNOSIS — E860 Dehydration: Secondary | ICD-10-CM

## 2018-09-20 DIAGNOSIS — L03115 Cellulitis of right lower limb: Secondary | ICD-10-CM

## 2018-09-20 DIAGNOSIS — E1151 Type 2 diabetes mellitus with diabetic peripheral angiopathy without gangrene: Secondary | ICD-10-CM

## 2018-09-20 DIAGNOSIS — Z89421 Acquired absence of other right toe(s): Secondary | ICD-10-CM

## 2018-09-20 DIAGNOSIS — D638 Anemia in other chronic diseases classified elsewhere: Secondary | ICD-10-CM

## 2018-09-20 DIAGNOSIS — E1122 Type 2 diabetes mellitus with diabetic chronic kidney disease: Secondary | ICD-10-CM

## 2018-09-20 LAB — CBC WITH DIFFERENTIAL/PLATELET
ABS IMMATURE GRANULOCYTES: 0.3 10*3/uL — AB (ref 0.00–0.07)
Basophils Absolute: 0.1 10*3/uL (ref 0.0–0.1)
Basophils Relative: 0 %
Eosinophils Absolute: 0.3 10*3/uL (ref 0.0–0.5)
Eosinophils Relative: 2 %
HCT: 29.3 % — ABNORMAL LOW (ref 39.0–52.0)
HEMOGLOBIN: 8.1 g/dL — AB (ref 13.0–17.0)
Immature Granulocytes: 2 %
Lymphocytes Relative: 13 %
Lymphs Abs: 2.2 10*3/uL (ref 0.7–4.0)
MCH: 22.1 pg — AB (ref 26.0–34.0)
MCHC: 27.6 g/dL — ABNORMAL LOW (ref 30.0–36.0)
MCV: 79.8 fL — ABNORMAL LOW (ref 80.0–100.0)
MONO ABS: 2 10*3/uL — AB (ref 0.1–1.0)
MONOS PCT: 12 %
NEUTROS ABS: 12 10*3/uL — AB (ref 1.7–7.7)
Neutrophils Relative %: 71 %
Platelets: 260 10*3/uL (ref 150–400)
RBC: 3.67 MIL/uL — ABNORMAL LOW (ref 4.22–5.81)
RDW: 14.4 % (ref 11.5–15.5)
WBC: 16.8 10*3/uL — ABNORMAL HIGH (ref 4.0–10.5)
nRBC: 0 % (ref 0.0–0.2)

## 2018-09-20 LAB — GLUCOSE, CAPILLARY
GLUCOSE-CAPILLARY: 134 mg/dL — AB (ref 70–99)
GLUCOSE-CAPILLARY: 159 mg/dL — AB (ref 70–99)
Glucose-Capillary: 146 mg/dL — ABNORMAL HIGH (ref 70–99)

## 2018-09-20 MED ORDER — AMLODIPINE BESYLATE 10 MG PO TABS: 10.0000 mg | ORAL_TABLET | Freq: Every day | ORAL | 0 refills | Status: DC

## 2018-09-20 MED ORDER — ATORVASTATIN CALCIUM 10 MG PO TABS: 10.0000 mg | ORAL_TABLET | Freq: Every day | ORAL | 0 refills | Status: DC

## 2018-09-20 MED ORDER — DOXYCYCLINE HYCLATE 100 MG PO TABS
100.0000 mg | ORAL_TABLET | Freq: Two times a day (BID) | ORAL | Status: DC
  Administered 2018-09-20: 100 mg via ORAL
  Filled 2018-09-20: qty 1

## 2018-09-20 MED ORDER — DOXYCYCLINE HYCLATE 100 MG PO TABS: 100.0000 mg | ORAL_TABLET | Freq: Two times a day (BID) | ORAL | 0 refills | Status: AC

## 2018-09-20 MED ORDER — ASPIRIN 81 MG PO TBEC: 81.0000 mg | DELAYED_RELEASE_TABLET | Freq: Every day | ORAL | 0 refills | Status: DC

## 2018-09-20 MED ORDER — POLYETHYLENE GLYCOL 3350 17 G PO PACK: 17.0000 g | PACK | Freq: Every day | ORAL | 0 refills | Status: DC

## 2018-09-20 MED ORDER — INSULIN ASPART 100 UNIT/ML ~~LOC~~ SOLN: 0.0000 [IU] | Freq: Three times a day (TID) | SUBCUTANEOUS | 0 refills | Status: DC

## 2018-09-20 MED ORDER — PANTOPRAZOLE SODIUM 40 MG PO TBEC: 40.0000 mg | DELAYED_RELEASE_TABLET | Freq: Every day | ORAL | 0 refills | Status: DC

## 2018-09-20 MED ORDER — CLOPIDOGREL BISULFATE 75 MG PO TABS: 75.0000 mg | ORAL_TABLET | Freq: Every day | ORAL | 0 refills | Status: DC

## 2018-09-20 MED ORDER — VANCOMYCIN HCL 500 MG IV SOLR
500.0000 mg | Freq: Once | INTRAVENOUS | Status: DC
  Filled 2018-09-20: qty 500

## 2018-09-20 MED ORDER — NEPRO/CARBSTEADY PO LIQD: 237.0000 mL | Freq: Two times a day (BID) | ORAL | 0 refills | Status: DC

## 2018-09-20 MED ORDER — CARVEDILOL 12.5 MG PO TABS: 12.5000 mg | ORAL_TABLET | Freq: Two times a day (BID) | ORAL | 0 refills | Status: DC

## 2018-09-20 MED ORDER — INSULIN GLARGINE 100 UNIT/ML ~~LOC~~ SOLN: 10.0000 [IU] | Freq: Two times a day (BID) | SUBCUTANEOUS | 0 refills | Status: DC

## 2018-09-20 MED ORDER — RENA-VITE PO TABS: 1.0000 | ORAL_TABLET | Freq: Every day | ORAL | 0 refills | Status: DC
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
## 2018-09-20 NOTE — Discharge Summary (Signed)
Discharge Summary  [name]:[id number] DOB: Nov 05, 1961  PCP: System, Pcp Not In  Admit date: 09/09/2018 Discharge date: 09/20/2018  Time spent: 35 minutes  Recommendations for Outpatient Follow-up:  1. Follow-up with your PCP/ ID 2. Follow-up with vascular surgery 3. Take your medications as prescribed 4. Continue physical therapy  Discharge Diagnoses:  Active Hospital Problems   Diagnosis Date Noted  . Diabetic foot ulcer ([place]) 09/09/2018  . Hypocalcemia 09/09/2018  . Dehydration 09/09/2018  . Unilateral complete BKA, left, sequela ([place])   . Type 2 diabetes mellitus with peripheral neuropathy (HCC)   . Hyperlipidemia   . Chronic kidney disease (CKD), stage IV (severe) ([place])   . DM type 2 causing CKD stage 4 ([place]) 05/30/2017  . Essential hypertension 03/30/2017  . AKI (acute kidney injury) ([place]) 03/30/2017  . Anemia of chronic disease 12/18/2013    Resolved Hospital Problems  No resolved problems to display.    Discharge Condition: Stable  Diet recommendation: Renal dialysis diet  Vitals:   09/19/18 2054 09/20/18 0905  BP: 131/62 (!) 149/77  Pulse: 70 79  Resp:    Temp: 98.8 F (37.1 C)   SpO2: 97%     History of present illness:  57 y.o.malewith medical history significant ofCKD4, left below-knee amputation, right transmetatarsal foot amputation, hypertension, diabetes, peripheral neuropathy, hyperlipidemia, tobacco abuse who presented with1 week history of right foot pressure worse in the lateral aspect of her right foot and purulent drainage.  He noticed skin breakdown of the foot about 2 weeks ago.  AKI on CKD 4 with creatininegreater than 6. Last nephrology visit prior to presentation was 6 months ago. Had Left AC fistula placed in October 2018.  During this admission patient transitioned to hemodialysis.  MRI of his right stump done and revealed early osteomyelitis.  MRSA screening positive.  Was given 10 days of IV antibiotics, including IV  vancomycin.  For his PVD, vascular surgery was consulted and had revascularization of right lower extremity on 09/16/2018.  Hospital course complicated by intermittent severe pain in his right stump.  Infectious disease was consulted on 09/20/2018 and recommended doxycycline 100 mg twice daily x5 days.  09/20/2018: Patient seen and examined at his bedside.  No acute events overnight.  Reports right stump pain from his wound is improved with pain medications.   Hospital Course:  Active Problems:   Anemia of chronic disease   AKI (acute kidney injury) ([place])   Essential hypertension   DM type 2 causing CKD stage 4 (HCC)   Unilateral complete BKA, left, sequela (HCC)   Chronic kidney disease (CKD), stage IV (severe) (HCC)   Type 2 diabetes mellitus with peripheral neuropathy (HCC)   Hyperlipidemia   Diabetic foot ulcer (HCC)   Hypocalcemia   Dehydration  Right lower extremity cellulitis with early changes of osteomyelitis on MRI right foot without contrast Completed 9 days of IV antibiotics empirically IV vancomycin, IV Flagyl, IV Rocephin Blood cultures negative x2 from 09/10/2018 Continue p.o. doxycycline 100 mg twice daily x5 days as recommended by infectious disease, Dr. Graylon Good.  Persistent leukocytosis Sed rate trending down but still significantly elevated 87 from 114 Infectious disease recommends doxycycline 100 mg twice daily x5 days Follow-up with infectious disease outpatient  Moderate peripheral artery disease Vascular surgery consulted and following Status post revascularization of right lower extremity 09/16/2018 Continue aspirin Plavix and Lipitor  New end-stage renal disease on dialysis Tuesday Thursday Saturday Nephrology consulted and following Per nephrology has HD spot outpatient HD completed  on 09/19/2018 and tolerated well Has hemodialysis tomorrow 09/21/2018 and understands the importance of going to his appointment  Anemia of chronic disease/iron  deficiency anemia IV iron supplement obtained during dialysis Hemoglobin stable at 8.1 No prior history of coronary artery disease or active cardiac symptoms Goal hemoglobin greater than 7  Resolved hypokalemia post repletion  Ambulatory dysfunction due to amputation PT to assess in the morning Fall precautions  Hypertension Blood pressures well controlled Resume blood pressure medications  Type 2 diabetes Last hemoglobin A1c 7.5 on 09/10/2018 Continue insulin Avoid hypoglycemia Follow-up with PCP   Code Status: Full code  Family Communication: None at bedside  Disposition Plan: Home possibly tomorrow 09/20/18   Consultants:  Nephrology  Vascular surgery  Orthopedic surgery  Infectious disease  Procedures:  Left arm AV fistula tunneled cath 09/13/2018  Revascularization of right lower extremity 09/16/2018  Antimicrobials:  P.o. doxycycline   Discharge Exam: BP (!) 149/77 (BP Location: Right Arm)   Pulse 79   Temp 98.8 F (37.1 C) (Oral)   Resp 18   Ht 6\' 4"  (1.93 m)   Wt 96.6 kg   SpO2 97%   BMI 25.92 kg/m  . General: 57 y.o. year-old male well developed well nourished in no acute distress.  Alert and oriented x3. . Cardiovascular: Regular rate and rhythm with no rubs or gallops.  No thyromegaly or JVD noted.   Marland Kitchen Respiratory: Clear to auscultation with no wheezes or rales. Good inspiratory effort. . Abdomen: Soft nontender nondistended with normal bowel sounds x4 quadrants. . Psychiatry: Mood is appropriate for condition and setting  Discharge Instructions You were cared for by a hospitalist during your hospital stay. If you have any questions about your discharge medications or the care you received while you were in the hospital after you are discharged, you can call the unit and asked to speak with the hospitalist on call if the hospitalist that took care of you is not available. Once you are discharged, your primary care physician  will handle any further medical issues. Please note that NO REFILLS for any discharge medications will be authorized once you are discharged, as it is imperative that you return to your primary care physician (or establish a relationship with a primary care physician if you do not have one) for your aftercare needs so that they can reassess your need for medications and monitor your lab values.   Allergies as of 09/20/2018   No Known Allergies     Medication List    STOP taking these medications   Insulin Glargine 100 UNIT/ML Solostar Pen Commonly known as:  LANTUS Replaced by:  insulin glargine 100 UNIT/ML injection   sodium bicarbonate 650 MG tablet     TAKE these medications   amLODipine 10 MG tablet Commonly known as:  NORVASC Take 1 tablet (10 mg total) by mouth at bedtime.   aspirin 81 MG EC tablet Take 1 tablet (81 mg total) by mouth daily. Start taking on:  09/21/2018   atorvastatin 10 MG tablet Commonly known as:  LIPITOR Take 1 tablet (10 mg total) by mouth at bedtime.   carvedilol 12.5 MG tablet Commonly known as:  COREG Take 1 tablet (12.5 mg total) by mouth 2 (two) times daily with a meal. What changed:    medication strength  how much to take   clopidogrel 75 MG tablet Commonly known as:  PLAVIX Take 1 tablet (75 mg total) by mouth daily. Start taking on:  09/21/2018   doxycycline  100 MG tablet Commonly known as:  VIBRA-TABS Take 1 tablet (100 mg total) by mouth every 12 (twelve) hours for 5 days.   feeding supplement (NEPRO CARB STEADY) Liqd Take 237 mLs by mouth 2 (two) times daily between meals.   gabapentin 300 MG capsule Commonly known as:  NEURONTIN Take 1 capsule (300 mg total) by mouth at bedtime.   insulin aspart 100 UNIT/ML injection Commonly known as:  novoLOG Inject 0-9 Units into the skin 3 (three) times daily with meals.   insulin glargine 100 UNIT/ML injection Commonly known as:  LANTUS Inject 0.1 mLs (10 Units total) into the  skin 2 (two) times daily. Replaces:  Insulin Glargine 100 UNIT/ML Solostar Pen   multivitamin Tabs tablet Take 1 tablet by mouth at bedtime.   oxyCODONE 5 MG immediate release tablet Commonly known as:  Oxy IR/ROXICODONE Take 1 tablet (5 mg total) by mouth every 6 (six) hours as needed for moderate pain or severe pain. For no more than 3 days.   pantoprazole 40 MG tablet Commonly known as:  PROTONIX Take 1 tablet (40 mg total) by mouth daily. Start taking on:  09/21/2018   polyethylene glycol packet Commonly known as:  MIRALAX / GLYCOLAX Take 17 g by mouth daily. Start taking on:  09/21/2018      No Known Allergies Follow-up Information    [facility] Schedule an appointment as soon as possible for a visit.   Contact information: [place] [address] [phone]        [place]. Call in 1 day(s).   Why:  Please call for a post hospital follow-up appointment. Contact information: [place] [address] [phone]       Carlyle Basques, MD. Call in 1 day(s).   Specialty:  Infectious Diseases Why:  Please call for a post hospital follow-up appointment. Contact information: [place]  [address] [phone]            The results of significant diagnostics from this hospitalization (including imaging, microbiology, ancillary and laboratory) are listed below for reference.    Significant Diagnostic Studies: Mr Foot Right Wo Contrast  Result Date: 09/11/2018 CLINICAL DATA:  57 year old male presents with 1 week history of right foot pressure worse in the lateral aspect with purulent drainage and skin breakdown x2 weeks prior. EXAM: MRI OF THE RIGHT FOREFOOT WITHOUT CONTRAST TECHNIQUE: Multiplanar, multisequence MR imaging of the right foot was performed. No intravenous contrast was administered. COMPARISON:  None.  FINDINGS: Bones/Joint/Cartilage Subtle cortical irregularity and marrow edema along the plantar lateral aspect of the fifth metatarsal base, adjacent to an area of subcutaneous soft tissue edema/cellulitis and ulceration, series 5/35. No acute fracture. No suspicious osseous lesions. The tibiotalar and subtalar joints are maintained. Mild osteoarthritic joint space narrowing of the midfoot. No joint effusion. Ligaments Ankle ligaments intact. Muscles and Tendons The tendons crossing the ankle joint appear intact. No evidence of pyomyositis. Soft tissues Subcutaneous soft tissue edema compatible cellulitis along the plantar lateral aspect of the midfoot adjacent to the amputation margin. Tiny 4 mm focus of fluid within the cellulitis is suspicious for tiny abscess series 5/30. Lack of IV contrast limits further assessment. IMPRESSION: Subtle cortical irregularity along the plantar lateral aspect of the fifth metatarsal base adjacent to an area of soft tissue ulceration, cellulitis and tiny presumed 4 mm soft tissue abscess. Findings would be in keeping with  early changes of osteomyelitis. Electronically Signed   By: Ashley Royalty M.D.   On: 09/11/2018 14:42   Dg Chest Port 1 View  Result Date: 09/13/2018 CLINICAL DATA:  Dialysis catheter placement EXAM: PORTABLE CHEST 1 VIEW COMPARISON:  03/30/2017 FINDINGS: Right jugular dual lumen central venous catheter tip in the right atrium. No pneumothorax. Low lung volumes with bibasilar atelectasis.  No effusion or edema IMPRESSION: Dialysis catheter tip in the right atrium. Bibasilar atelectasis with low lung volumes. Electronically Signed   By: Franchot Gallo M.D.   On: 09/13/2018 16:49   Dg Foot Complete Right  Result Date: 09/09/2018 CLINICAL DATA:  Right foot pain for 1 week, history of partial amputation EXAM: RIGHT FOOT COMPLETE - 1 VIEW COMPARISON:  None. FINDINGS: There is been amputation of the first metatarsal and digit as well as transmetatarsal amputation  through the second through fifth metatarsals. No definitive bony erosive changes are seen. Mild soft tissue swelling is noted. IMPRESSION: Mild soft tissue swelling. Changes consistent with prior amputation are noted. Electronically Signed   By: Inez Catalina M.D.   On: 09/09/2018 20:20   Dg Fluoro Guide Cv Line-no Report  Result Date: 09/13/2018 Fluoroscopy was utilized by the requesting physician.  No radiographic interpretation.    Microbiology: No results found for this or any previous visit (from the past 240 hour(s)).   Labs: Basic Metabolic Panel: Recent Labs  Lab 09/14/18 0020 09/16/18 0532 09/17/18 0730 09/19/18 1846  NA 135 136 136  --   K 3.5 3.4* 3.3* 3.8  CL 102 100 102  --   CO2 19* 26 25  --   GLUCOSE 178* 142* 141*  --   BUN 92* 42* 47*  --   CREATININE 9.43* 6.28* 6.91*  --   CALCIUM 6.7* 7.2* 7.5*  --   PHOS 8.0*  --  5.5*  --    Liver Function Tests: Recent Labs  Lab 09/14/18 0020 09/17/18 0730  ALBUMIN 2.3* 2.3*   No results for input(s): LIPASE, AMYLASE in the last 168 hours. No results for input(s): AMMONIA in the last 168 hours. CBC: Recent Labs  Lab 09/14/18 0019 09/16/18 0532 09/17/18 0730 09/19/18 1430 09/20/18 0910  WBC 13.1* 15.0* 14.2* 14.6* 16.8*  NEUTROABS  --   --   --   --  12.0*  HGB 7.1* 8.1* 7.6* 7.9* 8.1*  HCT 24.9* 27.5* 26.3* 28.2* 29.3*  MCV 77.6* 77.2* 78.5* 79.7* 79.8*  PLT 203 226 231 248 260   Cardiac Enzymes: No results for input(s): CKTOTAL, CKMB, CKMBINDEX, TROPONINI in the last 168 hours. BNP: BNP (last 3 results) No results for input(s): BNP in the last 8760 hours.  ProBNP (last 3 results) No results for input(s): PROBNP in the last 8760 hours.  CBG: Recent Labs  Lab 09/19/18 1708 09/19/18 2050 09/20/18 0609 09/20/18 0820 09/20/18 1106  GLUCAP 228* 143* 134* 146* 159*       Signed:  Kayleen Memos, MD [facility] 09/20/2018, 2:16 PM

## 2018-09-20 NOTE — Care Management Note (Signed)
Case Management Note [name] RN, BSN Transitions of Care Unit 4E- RN Case Manager [phone]  Patient Details  Name: [name] MRN: [id number] Date of Birth: 12-Feb-1961  Subjective/Objective:   Pt admitted with DM foot ulcer, s/p aortogram of LE and fistuologram UE -Pt underwent revision of L arm AV fistula tunneled cath 10/11 now s/p revascularization of RLE 10/14, pt is new HD pt and has been clipped for outpt spot TTS per renal note.                  Action/Plan: PTA pt lived at home with spouse, hx left BKA, right TMA. 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 Pt reports that he has medications at home including his Lantus insulin (about 1 mo supply). He also reports that he last saw a PCP in March when he had insurance- [facility]- Pt would prefer to go back there since he knows them- informed pt that they do see pt's that have no insurance. Instructed pt to go ahead an call for an appointment and also ask for eligibility appointment for sliding scale fees. Pt believes the PCP name was Glean Salvo. CM will f/u with pt once scripts done for transition home, pt is not interested in having meds filled here as he does not have ability to pay for them. He would prefer to have paper scripts to take with him. CM will follow  Expected Discharge Date:  09/20/18               Expected Discharge Plan:  Home/Self Care  In-House Referral:     Discharge planning Services  CM Consult, Medication Assistance, [place] Acute Care Choice:  Home Health Choice offered to:     DME Arranged:    DME Agency:     HH Arranged:  RN [place] Agency:  [facility]  Status of Service:  Completed, signed off  If discussed at [place] of Stay Meetings, dates  discussed:    Discharge Disposition: home/home health   Additional Comments:  09/20/18- 1530- [name] RN, CM- pt for home today- will need po abx, CM reviewed medications for discharge- and provided GoodRx coupon- abx will be about $10, which pt states he is ok with. HHRN ordered and choice offered for [facility] needs- per pt he has used [facility] in past and wants to use them again, referral called to [facility] with [facility].   [name], RN 09/20/2018, 3:28 PM

## 2018-09-20 NOTE — Discharge Instructions (Signed)
AV Fistula Placement, Care After Refer to this sheet in the next few weeks. These instructions provide you with information about caring for yourself after your procedure. Your health care provider may also give you more specific instructions. Your treatment has been planned according to current medical practices, but problems sometimes occur. Call your health care provider if you have any problems or questions after your procedure. What can I expect after the procedure? After your procedure, it is common to:  Feel sore.  Have numbness.  Feel cold.  Feel a vibration (thrill) over the fistula.  Follow these instructions at home:  Incision care  Do not take baths or showers, swim, or use a hot tub until your health care provider approves.  Keep the area around your cut from surgery (incision) clean and dry.  Follow instructions from your health care provider about how to take care of your incision. Make sure you: ? Wash your hands with soap and water before you change your bandage (dressing). If soap and water are not available, use hand sanitizer. ? Change your dressing as told by your health care provider. ? Leave stitches (sutures) and clips in place. They may need to stay in place for 2 weeks or longer. Fistula Care  Check your fistula site every day to make sure the thrill feels the same.  Check your fistula site every day for signs of infection. Watch for: ? Redness. ? Swelling. ? Discharge. ? Tenderness. ? Enlargement.  Keep your arm raised (elevated) while you rest.  Do not lift anything that is heavier than a gallon of milk with the arm that has the fistula.  Do not lie down on your fistula arm.  Do not let anyone draw blood or take a blood pressure reading on your fistula arm.  Do not wear tight jewelry or clothing over your fistula arm. General instructions  Rest at home for a day or two.  Gradually start doing your usual activities again. Ask your surgeon  when you can return to work or school.  Take over-the-counter and prescription medicines only as told by your health care provider.  Keep all follow-up visits as told by your health care provider. This is important. Contact a health care provider if:  You have chills or a fever.  You have pain at your fistula site that is not going away.  You have numbness or coldness at your fistula site that is not going away.  You feel a decrease or a change in the thrill.  You have swelling in your arm or hand.  You have redness, swelling, discharge, tenderness, or enlargement at your fistula site. Get help right away if:  You are bleeding from your fistula site.  You have chest pain.  You have trouble breathing. This information is not intended to replace advice given to you by your health care provider. Make sure you discuss any questions you have with your health care provider. Document Released: 11/20/2005 Document Revised: 03/29/2016 Document Reviewed: 02/10/2015 Elsevier Interactive Patient Education  2018 [employer].   Chronic Kidney Disease, Adult Chronic kidney disease (CKD) happens when the kidneys are damaged during a time of 3 or more months. The kidneys are two organs that do many important jobs in the body. These jobs include:  Removing wastes and extra fluids from the blood.  Making hormones that maintain the amount of fluid in your tissues and blood vessels.  Making sure that the body has the right amount of fluids and  chemicals.  Most of the time, this condition does not go away, but it can usually be controlled. Steps must be taken to slow down the kidney damage or stop it from getting worse. Otherwise, the kidneys may stop working. Follow these instructions at home:  Follow your diet as told by your doctor. You may need to avoid alcohol, salty foods (sodium), and foods that are high in potassium, calcium, and protein.  Take over-the-counter and prescription  medicines only as told by your doctor. Do not take any new medicines unless your doctor says you can do that. These include vitamins and minerals. ? Medicines and nutritional supplements can make kidney damage worse. ? Your doctor may need to change how much medicine you take.  Do not use any tobacco products. These include cigarettes, chewing tobacco, and e-cigarettes. If you need help quitting, ask your doctor.  Keep all follow-up visits as told by your doctor. This is important.  Check your blood pressure. Tell your doctor if there are changes to your blood pressure.  Get to a healthy weight. Stay at that weight. If you need help with this, ask your doctor.  Start or continue an exercise plan. Try to exercise at least 30 minutes a day, 5 days a week.  Stay up-to-date with your shots (immunizations) as told by your doctor. Contact a doctor if:  Your symptoms get worse.  You have new symptoms. Get help right away if:  You have symptoms of end-stage kidney disease. These include: ? Headaches. ? Skin that is darker or lighter than normal. ? Numbness in your hands or feet. ? Easy bruising. ? Having hiccups often. ? Chest pain. ? Shortness of breath. ? Stopping of menstrual periods in women.  You have a fever.  You are making very little pee (urine).  You have pain or bleeding when you pee (urinate). This information is not intended to replace advice given to you by your health care provider. Make sure you discuss any questions you have with your health care provider. Document Released: 02/14/2010 Document Revised: 04/27/2016 Document Reviewed: 07/19/2012 Elsevier Interactive Patient Education  2017 [employer]   Cellulitis, Adult Cellulitis is a skin infection. The infected area is usually red and sore. This condition occurs most often in the arms and lower legs. It is very important to get treated for this condition. Follow these instructions at home:  Take  over-the-counter and prescription medicines only as told by your doctor.  If you were prescribed an antibiotic medicine, take it as told by your doctor. Do not stop taking the antibiotic even if you start to feel better.  Drink enough fluid to keep your pee (urine) clear or pale yellow.  Do not touch or rub the infected area.  Raise (elevate) the infected area above the level of your heart while you are sitting or lying down.  Place warm or cold wet cloths (warm or cold compresses) on the infected area. Do this as told by your doctor.  Keep all follow-up visits as told by your doctor. This is important. These visits let your doctor make sure your infection is not getting worse. Contact a doctor if:  You have a fever.  Your symptoms do not get better after 1-2 days of treatment.  Your bone or joint under the infected area starts to hurt after the skin has healed.  Your infection comes back. This can happen in the same area or another area.  You have a swollen bump in the  infected area.  You have new symptoms.  You feel ill and also have muscle aches and pains. Get help right away if:  Your symptoms get worse.  You feel very sleepy.  You throw up (vomit) or have watery poop (diarrhea) for a long time.  There are red streaks coming from the infected area.  Your red area gets larger.  Your red area turns darker. This information is not intended to replace advice given to you by your health care provider. Make sure you discuss any questions you have with your health care provider. Document Released: 05/08/2008 Document Revised: 04/27/2016 Document Reviewed: 09/29/2015 Elsevier Interactive Patient Education  2018 [employer].   Hemodialysis Dialysis is a procedure that replaces some of the work that healthy kidneys do. During dialysis, wastes, salt, and extra water are removed from the blood, and the level of certain important minerals in the blood (such as potassium) is  maintained. It is typically started when you have lost about 85-90% of your kidney function, or earlier if it may help relieve your symptoms. Hemodialysis is a type of dialysis in which a machine called a dialyzer is used to filter the blood. Hemodialysis is usually performed by a health care provider at a hospital or dialysis center 3 times a week for 3-5 hours during each visit. It may also be performed on a different schedule at home with training. Tell a health care provider about:  Any allergies you have.  All medicines you are taking, including vitamins, herbs, eye drops, creams, and over-the-counter medicines.  Any blood disorders you have.  Any medical conditions you have. What are the risks? Generally, this is a safe procedure. However, problems may occur, including:  Low blood pressure (hypotension).  Narrowing or ballooning of a blood vessel, or bleeding at the site where blood is removed from the body and returned to the body (vascular access).  An infection or blockage at the vascular access site.  Allergic reaction to chemicals used to sterilize the dialyzer.  What happens before the procedure? Before having hemodialysis for the first time, you will need surgery or a procedure to create a vascular access site. There are three types of vascular access:  Arteriovenous fistula. This type of access is created when an artery and a vein (usually in the arm) are connected by a surgical procedure. The arteriovenous fistula usually takes 1-6 months to develop after surgery.  Arteriovenous graft. This type of access is created when a tube is used to connect an artery and a vein in the arm. An arteriovenous graft can usually be used within 2-3 weeks of surgery.  A venous catheter. To create this type of access, a thin, flexible tube (catheter) is placed in a large vein in your neck, chest, or groin. A venous catheter can be used right away.  What happens during the  procedure?  Your weight, blood pressure, pulse, and temperature will be measured.  The skin around your vascular access will be cleaned (sterilized) to get rid of germs.  Your vascular access will be connected to the dialyzer. ? If your access is a fistula or graft, two needles will be inserted through the vascular access. The needles will be connected to a plastic tube that is connected to the dialyzer. They will be taped to your skin so that they do not move out of place. ? If your access is a catheter, it will be connected to a plastic tube that is then connected to the dialyzer.  The dialysis machine will be turned on. This will cause your blood to flow to the dialyzer. The dialyzer will filter and clean your blood before returning it to your body. While the dialysis machine is working, your blood pressure and pulse will be checked several times.  Once dialysis is complete, your vascular access will be disconnected from the dialyzer. If your access is a fistula or graft, the needles will be removed and a bandage (dressing) will be placed over the access. Hemodialysis is performed while you are sitting or reclining. During the procedure, you may sleep, read, watch television, or perform any other tasks that can be done while you are in this position. If you experience side effects or any other discomfort during the procedure, let your health care provider know. He or she may be able to make adjustments to help you feel more comfortable. The procedure may vary among health care providers and hospitals. What happens after the procedure?  Your weight will be measured.  Your blood may be tested to see whether your treatments are removing enough wastes. This is usually done once a month.  You may experience or continue to experience side effects, including: ? Dizziness. ? Muscle cramps. ? Nausea. ? Headaches. ? Allergic reaction to the chemicals used to sterilize the  dialyzer. Summary  Dialysis is a procedure that replaces some of the work that healthy kidneys do. During dialysis, wastes, salt, and extra water are removed from the blood, and the level of certain important minerals in the blood is maintained.  Hemodialysis is a type of dialysis in which a machine called a dialyzer is used to filter the blood. It is usually performed by a health care provider at a hospital or dialysis center 3 times a week for 3-5 hours during each visit.  Before having hemodialysis for the first time, you will need surgery or a procedure to create a vascular access.  During hemodialysis, your vascular access will be connected to the dialyzer. The dialyzer will filter and clean your blood before returning it to your body. This information is not intended to replace advice given to you by your health care provider. Make sure you discuss any questions you have with your health care provider. Document Released: 03/17/2013 Document Revised: 12/26/2016 Document Reviewed: 12/26/2016 Elsevier Interactive Patient Education  2018 [employer].   Hemodialysis Hemodialysis is a way of removing wastes, salt, and extra water from your blood. It is done when your kidneys cannot keep the blood clean. During hemodialysis, your blood travels outside of your body to a machine. A filter in the machine cleans the blood. Hemodialysis is usually done 3 times a week. Visits last 3-5 hours. What happens before the procedure? An opening must be made to allow blood to be taken from your body and put back into your body (access). It is made weeks or months before you start hemodialysis. It is usually made in your arm. If you need hemodialysis right away, a thin, flexible tube (catheter) will be placed in your neck, chest, or groin. This type of access is usually temporary. What happens during the procedure? Hemodialysis is done while you are sitting or leaning back. During hemodialysis you may do any  activity as long as you stay sitting or leaning back. Many people sleep, watch television, or read. If you have side effects or feel uncomfortable, tell your doctor. Your doctor may make changes to help you feel more comfortable. Your hemodialysis visits may look like this:  1. You will be weighed and your temperature will be taken. Your blood pressure and pulse will be checked. 2. The skin around your access will be cleaned. 3. Two needles will be put into the access. They will be connected to a plastic tube. The needles will be taped to your skin so that they do not move. If you have a temporary access, your catheter will be connected to a plastic tube. 4. Your blood will go through the tube to the machine. The machine will clean your blood. Then your blood will go back to your body. Your blood pressure and pulse will be checked a few times. 5. Once the procedure is complete, the needles will be removed. A bandage (dressing) will be placed over the access. If your access is a catheter, it will be disconnected.  What happens after the procedure?  You will be weighed.  Your blood will be tested. This is usually done once a month.  You may have side effects. These include: ? Dizziness. ? Muscle cramps. ? Feeling sick to your stomach (nausea). ? Headaches. ? Feeling tired. You usually feel normal the next day. ? Itchiness. Your doctor may give medicine to help with this. ? Achy or jittery legs. You may feel like kicking your legs. This can cause sleeping problems. ? Allergic reaction. This information is not intended to replace advice given to you by your health care provider. Make sure you discuss any questions you have with your health care provider. Document Released: 11/02/2008 Document Revised: 04/27/2016 Document Reviewed: 01/06/2013 Elsevier Interactive Patient Education  2017 [place].  Dialysis Diet Dialysis is a treatment that you may undergo if you have significant damage to  your kidneys. Dialysis replaces some of the work that the kidneys do. One of the jobs that it takes over is removing wastes, salt, and extra water from your blood. This helps to keep the amount of potassium and other nutrients in your blood at healthy levels. When you need dialysis, it is important to pay careful attention to your diet. Between dialysis sessions, certain nutrients can build up in your blood and cause you to get sick. Vitamins and minerals are an important part of a healthy diet and should not be avoided entirely. However, it is commonly recommended that you limit your intake of potassium, phosphorus, and sodium. It may also be necessary to restrict other nutrients, such as carbohydrates or fat, if you have other health conditions. Your health care provider or dietitian can help you to determine the amount of these nutrients that is right for you. What is my plan? Your dietitian will help you to design a meal plan that is specific to your needs. Generally, meal plans include:  Grains, 6-11 servings per day. One serving is equal to 1 slice of bread or  cup of cooked rice or pasta.  Low-potassium vegetables, 2-3 servings per day. One serving is equal to  cup.  Low-potassium fruits, 2-3 servings per day. One serving is equal to  cup.  Meats and other protein sources, 8-11 oz per day.  Dairy,  cup per day.  Your dietitian will provide you with specific instructions about the amount of fluids you can have each day. What do I need to know about a dialysis diet?  Limit your intake of potassium. Potassium is found in milk, fruits, and vegetables.  Limit your intake of phosphorus. Phosphorus is found in milk, cheese, beans, nuts, and carbonated beverages. Avoid whole-grain and high-fiber foods because  they contain high amounts of phosphorus.  Limit your intake of sodium. Foods that are high in sodium include processed and cured meats, ready-made frozen meals, canned vegetables, and  salty snack foods. Do not use salt substitutes because they contain potassium.  If you were instructed to restrict your fluid intake, follow your health care provider's specific instructions. You may be told to: ? Write down what you drink and any foods you eat that are made mostly from water, such as gelatin and soups. ? Drink from small cups to help control how much you drink.  Ask your health care provider if you should regularly take an over-the-counter medicine that binds phosphorus, such as antacid products that contain calcium carbonate.  Take vitamin and mineral supplements only as directed by your health care provider.  Eat high-quality proteins, such as meat, poultry, fish, and eggs. Limit low-quality plant-based proteins, such as nuts and beans.  Cut potatoes into small pieces and boil them in unsalted water before you eat them. This can help to remove some potassium from the potato.  Drain all fluid from cooked vegetables and canned fruits before eating them. What foods can I eat? Grains White bread. White rice. Cooked cereal. Unsalted popcorn. Tortillas. Pasta. Vegetables Fresh or frozen broccoli, carrots, and green beans. Cabbage. Cauliflower. Celery. Cucumbers. Eggplant. Radishes. Zucchini. Fruits Apples. Fresh or frozen berries. Fresh or canned pears, peaches, and pineapple. Grapes. Plums. Meats and Other Protein Sources Fresh or frozen beef, pork, chicken, and fish. Eggs. Low-sodium canned tuna or salmon. Dairy Cream cheese. Heavy cream. Ricotta cheese. Beverages Apple cider. Cranberry juice. Grape juice. Lemonade. Black coffee. Condiments Herbs. Spices. Jam and jelly. Honey. Sweets and Desserts Sherbet. Cakes. Cookies. Fats and Oils Olive oil, canola oil, and safflower oil. Other Non-dairy creamer. Non-dairy whipped topping. Homemade broth without salt. The items listed above may not be a complete list of recommended foods or beverages. Contact your dietitian for  more options. What foods are not recommended? Grains Whole-grain bread. Whole-grain pasta. High-fiber cereal. Vegetables Potatoes. Beets. Tomatoes. Winter squash and pumpkin. Asparagus. Spinach. Parsnips. Fruits Star fruit. Bananas. Oranges. Kiwi. Nectarines. Prunes. Melon. Dried fruit. Avocado. Meats and Other Protein Sources Canned, smoked, and cured meats. Soil scientist. Sardines. Nuts and seeds. Peanut butter. Beans and legumes. Dairy Milk. Buttermilk. Yogurt. Cheese and cottage cheese. Processed cheese spreads. Beverages Orange juice. Prune juice. Carbonated soft drinks. Condiments Salt. Salt substitutes. Soy sauce. Sweets and Desserts Ice cream. Chocolate. Candied nuts. Fats and Oils Butter. Margarine. Other Ready-made frozen meals. Canned soups. The items listed above may not be a complete list of foods and beverages to avoid. Contact your dietitian for more information. This information is not intended to replace advice given to you by your health care provider. Make sure you discuss any questions you have with your health care provider. Document Released: 08/17/2004 Document Revised: 03/30/2016 Document Reviewed: 06/23/2014 Elsevier Interactive Patient Education  2018 [employer].   Dialysis Diet Dialysis is a treatment that cleans your blood. It is used when the kidneys are damaged. When you need dialysis, you should watch your diet. This is because some nutrients can build up in your blood between treatments and make you sick. These nutrients are:  Potassium.  Phosphorus.  Sodium.  Your doctor or dietitian will:  Tell you how much of these you can have.  Tell you if you need to look out for other nutrients too.  Help you plan meals.  Tell you how much to drink each  day.  What do I need to know about this diet?  Limit potassium. Potassium is in milk, fruits, and vegetables.  Limit phosphorus. Phosphorus is in milk, cheese, beans, nuts, and  carbonated beverages.  Limit salt (sodium). Foods that have a lot sodium include processed and cured meats, ready-made frozen meals, canned vegetables, and salty snack foods.  Do not use salt substitutes.  Try not to eat whole-grain foods and foods that have a lot of fiber.  Follow your doctor's instructions about how much to drink. You may be told to: ? Write down what you drink. ? Write down foods you eat that are made mostly from water, such as gelatin and soups. ? Drink from small cups.  Ask your doctor if you should take a medicine that binds phosphorus.  Take vitamin and mineral supplements only as told by your doctor.  Eat meat, poultry, fish, and eggs. Limit nuts and beans.  Before you cook potatoes, cut them into small pieces. Then boil them in unsalted water.  Drain all fluid from cooked vegetables and canned fruits before you eat them. What foods can I eat? Grains White bread. White rice. Cooked cereal. Unsalted popcorn. Tortillas. Pasta. Vegetables Fresh or frozen broccoli, carrots, and green beans. Cabbage. Cauliflower. Celery. Cucumbers. Eggplant. Radishes. Zucchini. Fruits Apples. Fresh or frozen berries. Fresh or canned pears, peaches, and pineapple. Grapes. Plums. Meats and Other Protein Sources Fresh or frozen beef, pork, chicken, and fish. Eggs. Low-sodium canned tuna or salmon. Dairy Cream cheese. Heavy cream. Ricotta cheese. Beverages Apple cider. Cranberry juice. Grape juice. Lemonade. Black coffee. Condiments Herbs. Spices. Jam and jelly. Honey. Sweets and Desserts Sherbet. Cakes. Cookies. Fats and Oils Olive oil, canola oil, and safflower oil. Other Non-dairy creamer. Non-dairy whipped topping. Homemade broth without salt. The items listed above may not be a complete list of recommended foods or beverages. Contact your dietitian for more options. What foods are not recommended? Grains Whole-grain bread. Whole-grain pasta. High-fiber  cereal. Vegetables Potatoes. Beets. Tomatoes. Winter squash and pumpkin. Asparagus. Spinach. Parsnips. Fruits Star fruit. Bananas. Oranges. Kiwi. Nectarines. Prunes. Melon. Dried fruit. Avocado. Meats and Other Protein Sources Canned, smoked, and cured meats. Soil scientist. Sardines. Nuts and seeds. Peanut butter. Beans and legumes. Dairy Milk. Buttermilk. Yogurt. Cheese and cottage cheese. Processed cheese spreads. Beverages Orange juice. Prune juice. Carbonated soft drinks. Condiments Salt. Salt substitutes. Soy sauce. Sweets and Desserts Ice cream. Chocolate. Candied nuts. Fats and Oils Butter. Margarine. Other Ready-made frozen meals. Canned soups. The items listed above may not be a complete list of foods and beverages to avoid. Contact your dietitian for more information. This information is not intended to replace advice given to you by your health care provider. Make sure you discuss any questions you have with your health care provider. Document Released: 05/21/2012 Document Revised: 04/27/2016 Document Reviewed: 06/23/2014 Elsevier Interactive Patient Education  [employer].

## 2018-09-20 NOTE — Progress Notes (Signed)
Subjective: Interval History: has no complaint , foot less painful.  Objective: Vital signs in last 24 hours: Temp:  [98.2 F (36.8 C)-98.8 F (37.1 C)] 98.8 F (37.1 C) (10/17 2054) Pulse Rate:  [70-79] 79 (10/18 0905) Resp:  [18] 18 (10/17 1619) BP: (121-170)/(60-89) 149/77 (10/18 0905) SpO2:  [97 %] 97 % (10/17 2054) Weight:  [95.6 kg-98.2 kg] 96.6 kg (10/18 0652) Weight change:   Intake/Output from previous day: 10/17 0701 - 10/18 0700 In: 360 [P.O.:360] Out: 2500  Intake/Output this shift: No intake/output data recorded.  General appearance: alert, cooperative and no distress Resp: clear to auscultation bilaterally Chest wall: RIJ PC Cardio: S1, S2 normal and systolic murmur: systolic ejection 2/6, crescendo and decrescendo at 2nd left intercostal space GI: soft, non-tender; bowel sounds normal; no masses,  no organomegaly Extremities: LUA AVF,  LBKA, R TMA with dressing, not warm  Lab Results: Recent Labs    09/19/18 1430 09/20/18 0910  WBC 14.6* 16.8*  HGB 7.9* 8.1*  HCT 28.2* 29.3*  PLT 248 260   BMET:  Recent Labs    09/19/18 1846  K 3.8   No results for input(s): PTH in the last 72 hours. Iron Studies: No results for input(s): IRON, TIBC, TRANSFERRIN, FERRITIN in the last 72 hours.  Studies/Results: No results found.  I have reviewed the patient's current medications.  Assessment/Plan: 1 ESRD HD [place].  TTS 2 Anemia esa/Fe 3 PVD post angioplasty, local care, po ab 4 DM controlled 5 HTN better with lower vol 6 HPTH vit D P d/c per primary, HD tts, , po ab, control DM    LOS: 11 days   Orvell Careaga 09/20/2018,11:42 AM

## 2018-09-20 NOTE — Progress Notes (Addendum)
Pharmacy Antibiotic Note  [name] is a 57 y.o. male admitted on 09/09/2018 with right foot pain and previous left BKA.  Pharmacy has been consulted for vancomycin dosing for right foot ulcer and possible osteomyelitis. 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   10/12 Random vancomycin level 25 pre-HD (pre afternoon session - pt was dialyzed twice on 10/12) and vancomycin 750mg  IV given that evening post second HD session. Last dose of vancomycin given on 10/15 before antibiotics discontinued. Underwent HD yesterday. Pharmacy consulted to restart vancomycin. Sed rate 87. Afebrile. Plan for HD TTS at discharge.  Plan: Order vancomycin 500mg  IV x 1 today to get back onto schedule. Further doses once dialysis schedule is known if pt continues to remain in hospital vs discharge. Order vancomycin random prior to next dose of vancomycin  Height: 6\' 4"  (193 cm) Weight: 212 lb 15.4 oz (96.6 kg) IBW/kg (Calculated) : 86.8  Temp (24hrs), Avg:98.5 F (36.9 C), Min:98.2 F (36.8 C), Max:98.8 F (37.1 C)  Recent Labs  Lab 09/14/18 0019 09/14/18 0020 09/14/18 1222 09/16/18 0532 09/17/18 0730 09/19/18 1430 09/19/18 1846 09/19/18 2114 09/20/18 0910  WBC 13.1*  --   --  15.0* 14.2* 14.6*  --   --  16.8*  CREATININE  --  9.43*  --  6.28* 6.91*  --   --   --   --   LATICACIDVEN  --   --   --   --   --   --  1.2 1.2  --   VANCORANDOM  --   --  25  --   --   --   --   --   --     Estimated Creatinine Clearance: 14.1 mL/min (A) (by C-G formula based on SCr of 6.91 mg/dL (H)).    No Known Allergies  Antimicrobials this admission: Vancomycin 10/7 >>10/15, 10/18>> Zosyn 2.25 g x1 10/7 Ceftriaxone 10/8 >>  Flagyl 10/8 >>   Microbiology results: 10/8 BCx: negative 10/8 MRSA PCR: positive  Doylene Canard, PharmD Clinical Pharmacist  Pager: [phone] Phone: [phone] **Pharmacist phone directory can now be found on [email or web address] ([email or web address]).  Listed under [place]. 09/20/2018 10:58 AM   ADDENDUM Plan changed to doxycyline for 5 days per ID. Pharmacy will sign off.  Doylene Canard, PharmD Clinical Pharmacist

## 2018-09-20 NOTE — Progress Notes (Signed)
Pt discharged home with wife. IV and telemetry box removed. Pt received discharge instructions and all questions were answered. Pt left with all of his belongings. Pt discharged via wheelchair and was accompanied by this RN.   Ara Kussmaul BSN, RN

## 2018-09-20 NOTE — Plan of Care (Signed)
Pt is alert and oriented. Able to make needs known. Educated on pain management and infection control

## 2018-09-20 NOTE — Progress Notes (Signed)
Physical Therapy Treatment Patient Details Name: [name] MRN: [id number] DOB: 1961/03/08 Today's Date: 09/20/2018    History of Present Illness [name] is a 57 y.o. male with medical history significant of CKD, left below-knee amputation, right transmetatarsal foot amputation, hypertension, diabetes, peripheral neuropathy, hyperlipidemia, admitted with worsening fatigue, weakness, lightheadedness and nausea and vomiting -- with admitting diagnosis of Renal failure, and now starting HD; Also in the past 2 weeks, pt reports an ulcer on R foot, lateral aspect near transmet amputation, as well as L prosthesis more painful, leading to less walking, and functional decline on top of Renal Failure. now s/p Tunneled catheter placement and work on his fistula; plans for vascular intervention to perfuse RLE on afternoon of 10/14    PT Comments    Patient requiring increased encouragement to participate, but ultimately stating at end of session, "I'm glad I did this." Currently performing functional mobility at a supervision level. Ambulating 250 feet with cane, [name] prosthetic, and R AFO (states R shoe insert is at home). Recommended cane for safety with all mobility. Reports improved [name] prosthetic fit and good pain control with mobility. Recommending OPPT for continued gait training, strengthening, and endurance. No equipment needs.   Follow Up Recommendations  Outpatient PT     Equipment Recommendations  None recommended by PT    Recommendations for Other Services       Precautions / Restrictions Precautions Precautions: Fall Precaution Comments: Be mindful of pressure on R lateral aspect of foot, and of prosthesis fit/pain Required Braces or Orthoses: Other Brace/Splint Other Brace/Splint: Prosthesis L LE; orthosis R foot; R foot insert still at home Restrictions Weight Bearing Restrictions: No    Mobility  Bed Mobility Overal bed mobility: Modified Independent                Transfers Overall transfer level: Needs assistance Equipment used: None Transfers: Sit to/from Stand Sit to Stand: Supervision         General transfer comment: Supervision for safety. Pt with decreased eccentric control with one instance of turning to sit down on a low surface height chair  Ambulation/Gait Ambulation/Gait assistance: Supervision Gait Distance (Feet): 250 Feet with 1 seated rest break Assistive device: Straight cane Gait Pattern/deviations: Antalgic;Step-through pattern Gait velocity: decreased   General Gait Details: Mild unsteadiness noted with improvement using cane. Antalgic gait pattern but demonstrates good posture   Stairs             Wheelchair Mobility    Modified Rankin (Stroke Patients Only)       Balance Overall balance assessment: Needs assistance   Sitting balance-Leahy Scale: Good     Standing balance support: No upper extremity supported;During functional activity Standing balance-Leahy Scale: Fair                              Cognition Arousal/Alertness: Awake/alert Behavior During Therapy: WFL for tasks assessed/performed Overall Cognitive Status: Within Functional Limits for tasks assessed                                        Exercises      General Comments        Pertinent Vitals/Pain Pain Assessment: Faces Faces Pain Scale: Hurts a little bit Pain Location: Right foot with today's walk Pain Descriptors / Indicators: Grimacing Pain Intervention(s): Monitored during  session    Home Living                      Prior Function            PT Goals (current goals can now be found in the care plan section) Acute Rehab PT Goals Patient Stated Goal: return to work PT Goal Formulation: With patient Potential to Achieve Goals: Good Progress towards PT goals: Progressing toward goals    Frequency    Min 3X/week      PT Plan Current plan remains  appropriate    Co-evaluation              AM-PAC PT "6 Clicks" Daily Activity  Outcome Measure  Difficulty turning over in bed (including adjusting bedclothes, sheets and blankets)?: None Difficulty moving from lying on back to sitting on the side of the bed? : None Difficulty sitting down on and standing up from a chair with arms (e.g., wheelchair, bedside commode, etc,.)?: A Little Help needed moving to and from a bed to chair (including a wheelchair)?: A Little Help needed walking in hospital room?: A Little Help needed climbing 3-5 steps with a railing? : A Lot 6 Click Score: 19    End of Session Equipment Utilized During Treatment: Other (comment)(R AFO, [name] prosthetic) Activity Tolerance: Patient tolerated treatment well Patient left: in bed;with call bell/phone within reach Nurse Communication: Mobility status PT Visit Diagnosis: Unsteadiness on feet (R26.81);Other abnormalities of gait and mobility (R26.89);Muscle weakness (generalized) (M62.81);Pain     Time: 0932-3557 PT Time Calculation (min) (ACUTE ONLY): 21 min  Charges:  $Therapeutic Activity: 8-22 mins                    Ellamae Sia, PT, DPT Acute Rehabilitation Services Pager [phone] Office [phone]    [name] 09/20/2018, 9:41 AM

## 2018-09-25 ENCOUNTER — Other Ambulatory Visit: Payer: Self-pay

## 2018-09-25 DIAGNOSIS — N185 Chronic kidney disease, stage 5: Secondary | ICD-10-CM

## 2018-09-26 DIAGNOSIS — E440 Moderate protein-calorie malnutrition: Secondary | ICD-10-CM | POA: Insufficient documentation

## 2018-10-07 DIAGNOSIS — D509 Iron deficiency anemia, unspecified: Secondary | ICD-10-CM | POA: Insufficient documentation

## 2018-10-22 ENCOUNTER — Emergency Department (HOSPITAL_COMMUNITY): Payer: Medicare Other

## 2018-10-22 ENCOUNTER — Inpatient Hospital Stay (HOSPITAL_COMMUNITY)
Admission: EM | Admit: 2018-10-22 | Discharge: 2018-11-01 | DRG: 617 | Disposition: A | Discharge status: Rehabilitation Facility | Payer: Medicare Other | Attending: Internal Medicine | Admitting: Internal Medicine

## 2018-10-22 DIAGNOSIS — L97519 Non-pressure chronic ulcer of other part of right foot with unspecified severity: Secondary | ICD-10-CM | POA: Diagnosis present

## 2018-10-22 DIAGNOSIS — E1122 Type 2 diabetes mellitus with diabetic chronic kidney disease: Secondary | ICD-10-CM | POA: Diagnosis present

## 2018-10-22 DIAGNOSIS — M79671 Pain in right foot: Secondary | ICD-10-CM

## 2018-10-22 DIAGNOSIS — E861 Hypovolemia: Secondary | ICD-10-CM | POA: Diagnosis present

## 2018-10-22 DIAGNOSIS — L03115 Cellulitis of right lower limb: Secondary | ICD-10-CM | POA: Diagnosis present

## 2018-10-22 DIAGNOSIS — B9562 Methicillin resistant Staphylococcus aureus infection as the cause of diseases classified elsewhere: Secondary | ICD-10-CM | POA: Diagnosis present

## 2018-10-22 DIAGNOSIS — I120 Hypertensive chronic kidney disease with stage 5 chronic kidney disease or end stage renal disease: Secondary | ICD-10-CM | POA: Diagnosis present

## 2018-10-22 DIAGNOSIS — Z79899 Other long term (current) drug therapy: Secondary | ICD-10-CM

## 2018-10-22 DIAGNOSIS — R2689 Other abnormalities of gait and mobility: Secondary | ICD-10-CM | POA: Diagnosis present

## 2018-10-22 DIAGNOSIS — Y712 Prosthetic and other implants, materials and accessory cardiovascular devices associated with adverse incidents: Secondary | ICD-10-CM | POA: Diagnosis present

## 2018-10-22 DIAGNOSIS — E876 Hypokalemia: Secondary | ICD-10-CM | POA: Diagnosis present

## 2018-10-22 DIAGNOSIS — M869 Osteomyelitis, unspecified: Secondary | ICD-10-CM | POA: Diagnosis present

## 2018-10-22 DIAGNOSIS — N186 End stage renal disease: Secondary | ICD-10-CM | POA: Diagnosis present

## 2018-10-22 DIAGNOSIS — E1169 Type 2 diabetes mellitus with other specified complication: Secondary | ICD-10-CM | POA: Diagnosis present

## 2018-10-22 DIAGNOSIS — M792 Neuralgia and neuritis, unspecified: Secondary | ICD-10-CM

## 2018-10-22 DIAGNOSIS — Z87891 Personal history of nicotine dependence: Secondary | ICD-10-CM

## 2018-10-22 DIAGNOSIS — E119 Type 2 diabetes mellitus without complications: Secondary | ICD-10-CM

## 2018-10-22 DIAGNOSIS — R402254 Coma scale, best verbal response, oriented, 24 hours or more after hospital admission: Secondary | ICD-10-CM | POA: Diagnosis not present

## 2018-10-22 DIAGNOSIS — I10 Essential (primary) hypertension: Secondary | ICD-10-CM

## 2018-10-22 DIAGNOSIS — Z89512 Acquired absence of left leg below knee: Secondary | ICD-10-CM

## 2018-10-22 DIAGNOSIS — I739 Peripheral vascular disease, unspecified: Secondary | ICD-10-CM

## 2018-10-22 DIAGNOSIS — Z7902 Long term (current) use of antithrombotics/antiplatelets: Secondary | ICD-10-CM

## 2018-10-22 DIAGNOSIS — T8241XA Breakdown (mechanical) of vascular dialysis catheter, initial encounter: Secondary | ICD-10-CM | POA: Diagnosis present

## 2018-10-22 DIAGNOSIS — R402364 Coma scale, best motor response, obeys commands, 24 hours or more after hospital admission: Secondary | ICD-10-CM | POA: Diagnosis not present

## 2018-10-22 DIAGNOSIS — D649 Anemia, unspecified: Secondary | ICD-10-CM

## 2018-10-22 DIAGNOSIS — Z992 Dependence on renal dialysis: Secondary | ICD-10-CM

## 2018-10-22 DIAGNOSIS — Z95828 Presence of other vascular implants and grafts: Secondary | ICD-10-CM

## 2018-10-22 DIAGNOSIS — I9589 Other hypotension: Secondary | ICD-10-CM | POA: Diagnosis present

## 2018-10-22 DIAGNOSIS — L089 Local infection of the skin and subcutaneous tissue, unspecified: Secondary | ICD-10-CM

## 2018-10-22 DIAGNOSIS — T148XXA Other injury of unspecified body region, initial encounter: Secondary | ICD-10-CM

## 2018-10-22 DIAGNOSIS — R262 Difficulty in walking, not elsewhere classified: Secondary | ICD-10-CM

## 2018-10-22 DIAGNOSIS — N2581 Secondary hyperparathyroidism of renal origin: Secondary | ICD-10-CM | POA: Diagnosis present

## 2018-10-22 DIAGNOSIS — Z89431 Acquired absence of right foot: Secondary | ICD-10-CM

## 2018-10-22 DIAGNOSIS — E11621 Type 2 diabetes mellitus with foot ulcer: Secondary | ICD-10-CM | POA: Diagnosis present

## 2018-10-22 DIAGNOSIS — D631 Anemia in chronic kidney disease: Secondary | ICD-10-CM | POA: Diagnosis present

## 2018-10-22 DIAGNOSIS — D509 Iron deficiency anemia, unspecified: Secondary | ICD-10-CM | POA: Diagnosis present

## 2018-10-22 DIAGNOSIS — R402144 Coma scale, eyes open, spontaneous, 24 hours or more after hospital admission: Secondary | ICD-10-CM | POA: Diagnosis not present

## 2018-10-22 DIAGNOSIS — E11622 Type 2 diabetes mellitus with other skin ulcer: Secondary | ICD-10-CM | POA: Diagnosis present

## 2018-10-22 DIAGNOSIS — K219 Gastro-esophageal reflux disease without esophagitis: Secondary | ICD-10-CM | POA: Diagnosis present

## 2018-10-22 DIAGNOSIS — Z419 Encounter for procedure for purposes other than remedying health state, unspecified: Secondary | ICD-10-CM

## 2018-10-22 DIAGNOSIS — G8918 Other acute postprocedural pain: Secondary | ICD-10-CM | POA: Diagnosis present

## 2018-10-22 DIAGNOSIS — E1151 Type 2 diabetes mellitus with diabetic peripheral angiopathy without gangrene: Secondary | ICD-10-CM | POA: Diagnosis present

## 2018-10-22 DIAGNOSIS — Z7982 Long term (current) use of aspirin: Secondary | ICD-10-CM

## 2018-10-22 DIAGNOSIS — E1142 Type 2 diabetes mellitus with diabetic polyneuropathy: Secondary | ICD-10-CM | POA: Diagnosis present

## 2018-10-22 DIAGNOSIS — I953 Hypotension of hemodialysis: Secondary | ICD-10-CM | POA: Diagnosis present

## 2018-10-22 DIAGNOSIS — E785 Hyperlipidemia, unspecified: Secondary | ICD-10-CM | POA: Diagnosis present

## 2018-10-22 DIAGNOSIS — Z794 Long term (current) use of insulin: Secondary | ICD-10-CM

## 2018-10-22 HISTORY — DX: End stage renal disease: Z99.2

## 2018-10-22 HISTORY — DX: Dependence on renal dialysis: N18.6

## 2018-10-22 LAB — CBC WITH DIFFERENTIAL/PLATELET
ABS IMMATURE GRANULOCYTES: 0.22 10*3/uL — AB (ref 0.00–0.07)
BASOS ABS: 0.1 10*3/uL (ref 0.0–0.1)
BASOS PCT: 0 %
EOS ABS: 0.3 10*3/uL (ref 0.0–0.5)
Eosinophils Relative: 2 %
HCT: 28.4 % — ABNORMAL LOW (ref 39.0–52.0)
Hemoglobin: 8.2 g/dL — ABNORMAL LOW (ref 13.0–17.0)
IMMATURE GRANULOCYTES: 1 %
Lymphocytes Relative: 9 %
Lymphs Abs: 1.5 10*3/uL (ref 0.7–4.0)
MCH: 22.3 pg — ABNORMAL LOW (ref 26.0–34.0)
MCHC: 28.9 g/dL — ABNORMAL LOW (ref 30.0–36.0)
MCV: 77.2 fL — ABNORMAL LOW (ref 80.0–100.0)
Monocytes Absolute: 1 10*3/uL (ref 0.1–1.0)
Monocytes Relative: 6 %
NEUTROS ABS: 13.2 10*3/uL — AB (ref 1.7–7.7)
NEUTROS PCT: 82 %
NRBC: 0 % (ref 0.0–0.2)
Platelets: 167 10*3/uL (ref 150–400)
RBC: 3.68 MIL/uL — ABNORMAL LOW (ref 4.22–5.81)
RDW: 16 % — AB (ref 11.5–15.5)
WBC: 16.2 10*3/uL — ABNORMAL HIGH (ref 4.0–10.5)

## 2018-10-22 LAB — BASIC METABOLIC PANEL
ANION GAP: 12 (ref 5–15)
BUN: 26 mg/dL — ABNORMAL HIGH (ref 6–20)
CALCIUM: 7.9 mg/dL — AB (ref 8.9–10.3)
CO2: 27 mmol/L (ref 22–32)
Chloride: 98 mmol/L (ref 98–111)
Creatinine, Ser: 3.48 mg/dL — ABNORMAL HIGH (ref 0.61–1.24)
GFR calc Af Amer: 21 mL/min — ABNORMAL LOW (ref >60)
GFR calc non Af Amer: 18 mL/min — ABNORMAL LOW (ref >60)
GLUCOSE: 220 mg/dL — AB (ref 70–99)
POTASSIUM: 3 mmol/L — AB (ref 3.5–5.1)
Sodium: 137 mmol/L (ref 135–145)

## 2018-10-22 LAB — GLUCOSE, CAPILLARY: GLUCOSE-CAPILLARY: 329 mg/dL — AB (ref 70–99)

## 2018-10-22 LAB — I-STAT CG4 LACTIC ACID, ED: LACTIC ACID, VENOUS: 1.84 mmol/L (ref 0.5–1.9)

## 2018-10-22 MED ORDER — VANCOMYCIN HCL 10 G IV SOLR
2000.0000 mg | Freq: Once | INTRAVENOUS | Status: AC
  Administered 2018-10-22: 2000 mg via INTRAVENOUS
  Filled 2018-10-22: qty 2000

## 2018-10-22 MED ORDER — HYDROCODONE-ACETAMINOPHEN 5-325 MG PO TABS
1.0000 | ORAL_TABLET | Freq: Once | ORAL | Status: AC
  Administered 2018-10-22: 1 via ORAL
  Filled 2018-10-22: qty 1

## 2018-10-22 MED ORDER — SODIUM CHLORIDE 0.9 % IV BOLUS
500.0000 mL | Freq: Once | INTRAVENOUS | Status: AC
  Administered 2018-10-22: 500 mL via INTRAVENOUS

## 2018-10-22 MED ORDER — VANCOMYCIN HCL IN DEXTROSE 1-5 GM/200ML-% IV SOLN
1000.0000 mg | INTRAVENOUS | Status: DC
  Administered 2018-10-24: 1000 mg via INTRAVENOUS
  Filled 2018-10-22: qty 200
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
## 2018-10-22 NOTE — Progress Notes (Addendum)
Pharmacy Antibiotic Note  [name] is a 57 y.o. male admitted on 10/22/2018 with wound infection.  Pharmacy has been consulted for vancomycin dosing. Patient previously discharged ~3 weeks ago after receiving 10 days IV antibiotics for osteomyelitis of right foot, now s/p amputation. Currently, elevated WBC 16.2.  Of note, patient started on HD (T/Th/S) during last admission; patient completed a full session today.   Plan: Vancomycin 2000 mg IV once, then 1000 mg IV qHD T/Th/S F/u dialysis schedule and LOT Monitor pre-HD vancomycin random prn      No data recorded.  Recent Labs  Lab 10/22/18 1852 10/22/18 1906  WBC 16.2*  --   CREATININE 3.48*  --   LATICACIDVEN  --  1.84    CrCl cannot be calculated (Unknown ideal weight.).    No Known Allergies  Antimicrobials this admission: Vancomycin 11/19>>  Microbiology results: None  Thank you for allowing pharmacy to be a part of this patient's care.  Willia Craze, Pharmacy Student

## 2018-10-22 NOTE — ED Notes (Signed)
ED Provider at bedside. 

## 2018-10-22 NOTE — ED Notes (Signed)
IV Team responded to order to de-access HD cath. Upon assessment by this nurse and IV team, catheter is not accessed at this time and does not need de-accessing.

## 2018-10-22 NOTE — ED Triage Notes (Addendum)
Pt to ED via EMS from dialysis. Pt completed entire tx, took off 3 kg. When they stood him up to weigh him, he had a syncopal/near syncopal episode and hypotensive. Recurrent episode with EMS. Pt given 650 ml bolus PTA. EMS had dialysis access dialysis port for their use. Pt would also like evaluation of painful foot post surgery that is causing him trouble walking.

## 2018-10-22 NOTE — Progress Notes (Signed)
Received report from ED RN. Room ready for patient. Donnavin Vandenbrink Joselita, RN 

## 2018-10-22 NOTE — ED Notes (Signed)
Attempted to call report; nurse of the floor. Secretary requested that I call back in 10 minutes to give report.

## 2018-10-22 NOTE — ED Notes (Signed)
Orthostatics Lying -   BP  156/68  HR 87 Sitting - BP  133/97  HR  96 Standing - BP 138/78  HR 99

## 2018-10-22 NOTE — ED Notes (Signed)
[name] can call his friend Joneen Boers when/if he is discharged for a ride.  [phone]

## 2018-10-22 NOTE — ED Notes (Signed)
Patient transported to X-ray 
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
## 2018-10-23 ENCOUNTER — Other Ambulatory Visit: Payer: Self-pay

## 2018-10-23 ENCOUNTER — Encounter (HOSPITAL_COMMUNITY): Payer: Self-pay | Admitting: *Deleted

## 2018-10-23 ENCOUNTER — Inpatient Hospital Stay (HOSPITAL_COMMUNITY): Payer: Medicare Other

## 2018-10-23 DIAGNOSIS — I739 Peripheral vascular disease, unspecified: Secondary | ICD-10-CM

## 2018-10-23 DIAGNOSIS — K219 Gastro-esophageal reflux disease without esophagitis: Secondary | ICD-10-CM

## 2018-10-23 DIAGNOSIS — E876 Hypokalemia: Secondary | ICD-10-CM

## 2018-10-23 DIAGNOSIS — R262 Difficulty in walking, not elsewhere classified: Secondary | ICD-10-CM

## 2018-10-23 DIAGNOSIS — N186 End stage renal disease: Secondary | ICD-10-CM

## 2018-10-23 DIAGNOSIS — D649 Anemia, unspecified: Secondary | ICD-10-CM

## 2018-10-23 LAB — GLUCOSE, CAPILLARY
GLUCOSE-CAPILLARY: 290 mg/dL — AB (ref 70–99)
GLUCOSE-CAPILLARY: 237 mg/dL — AB (ref 70–99)
GLUCOSE-CAPILLARY: 297 mg/dL — AB (ref 70–99)
Glucose-Capillary: 300 mg/dL — ABNORMAL HIGH (ref 70–99)
Glucose-Capillary: 188 mg/dL — ABNORMAL HIGH (ref 70–99)

## 2018-10-23 LAB — BASIC METABOLIC PANEL
ANION GAP: 12 (ref 5–15)
BUN: 36 mg/dL — ABNORMAL HIGH (ref 6–20)
CO2: 26 mmol/L (ref 22–32)
CREATININE: 4.58 mg/dL — AB (ref 0.61–1.24)
Calcium: 7.8 mg/dL — ABNORMAL LOW (ref 8.9–10.3)
Chloride: 99 mmol/L (ref 98–111)
GFR calc non Af Amer: 13 mL/min — ABNORMAL LOW (ref >60)
GFR, EST AFRICAN AMERICAN: 15 mL/min — AB (ref >60)
Glucose, Bld: 278 mg/dL — ABNORMAL HIGH (ref 70–99)
Potassium: 3.2 mmol/L — ABNORMAL LOW (ref 3.5–5.1)
SODIUM: 137 mmol/L (ref 135–145)

## 2018-10-23 LAB — MRSA PCR SCREENING: MRSA by PCR: NEGATIVE

## 2018-10-23 LAB — CBC
HCT: 25.6 % — ABNORMAL LOW (ref 39.0–52.0)
Hemoglobin: 7.3 g/dL — ABNORMAL LOW (ref 13.0–17.0)
MCH: 21.9 pg — ABNORMAL LOW (ref 26.0–34.0)
MCHC: 28.5 g/dL — AB (ref 30.0–36.0)
MCV: 76.9 fL — ABNORMAL LOW (ref 80.0–100.0)
NRBC: 0 % (ref 0.0–0.2)
Platelets: 163 10*3/uL (ref 150–400)
RBC: 3.33 MIL/uL — AB (ref 4.22–5.81)
RDW: 15.8 % — ABNORMAL HIGH (ref 11.5–15.5)
WBC: 11.4 10*3/uL — AB (ref 4.0–10.5)

## 2018-10-23 LAB — MAGNESIUM: MAGNESIUM: 1.8 mg/dL (ref 1.7–2.4)

## 2018-10-23 MED ORDER — POTASSIUM CHLORIDE CRYS ER 20 MEQ PO TBCR
40.0000 meq | EXTENDED_RELEASE_TABLET | Freq: Once | ORAL | Status: AC
  Administered 2018-10-23: 40 meq via ORAL
  Filled 2018-10-23 (×2): qty 2

## 2018-10-23 MED ORDER — CLOPIDOGREL BISULFATE 75 MG PO TABS
75.0000 mg | ORAL_TABLET | Freq: Every day | ORAL | Status: DC
  Administered 2018-10-23 – 2018-10-24 (×2): 75 mg via ORAL
  Filled 2018-10-23 (×2): qty 1

## 2018-10-23 MED ORDER — INSULIN ASPART 100 UNIT/ML ~~LOC~~ SOLN
0.0000 [IU] | Freq: Three times a day (TID) | SUBCUTANEOUS | Status: DC
  Administered 2018-10-23: 5 [IU] via SUBCUTANEOUS
  Administered 2018-10-23: 3 [IU] via SUBCUTANEOUS
  Administered 2018-10-23 – 2018-10-24 (×2): 5 [IU] via SUBCUTANEOUS
  Administered 2018-10-24: 1 [IU] via SUBCUTANEOUS
  Administered 2018-10-24: 5 [IU] via SUBCUTANEOUS
  Administered 2018-10-25: 3 [IU] via SUBCUTANEOUS
  Administered 2018-10-25: 2 [IU] via SUBCUTANEOUS
  Administered 2018-10-26: 3 [IU] via SUBCUTANEOUS
  Administered 2018-10-27: 2 [IU] via SUBCUTANEOUS
  Administered 2018-10-27 (×2): 3 [IU] via SUBCUTANEOUS
  Administered 2018-10-28 (×2): 2 [IU] via SUBCUTANEOUS
  Administered 2018-10-29: 1 [IU] via SUBCUTANEOUS
  Administered 2018-10-29: 2 [IU] via SUBCUTANEOUS
  Administered 2018-10-30 – 2018-11-01 (×3): 1 [IU] via SUBCUTANEOUS
  Administered 2018-11-01: 2 [IU] via SUBCUTANEOUS

## 2018-10-23 MED ORDER — ASPIRIN EC 81 MG PO TBEC
81.0000 mg | DELAYED_RELEASE_TABLET | Freq: Every day | ORAL | Status: DC
  Administered 2018-10-23 – 2018-10-24 (×2): 81 mg via ORAL
  Filled 2018-10-23 (×2): qty 1

## 2018-10-23 MED ORDER — INSULIN GLARGINE 100 UNIT/ML ~~LOC~~ SOLN
15.0000 [IU] | Freq: Two times a day (BID) | SUBCUTANEOUS | Status: DC
  Administered 2018-10-23 – 2018-10-27 (×10): 15 [IU] via SUBCUTANEOUS
  Filled 2018-10-23 (×11): qty 0.15

## 2018-10-23 MED ORDER — GABAPENTIN 300 MG PO CAPS
300.0000 mg | ORAL_CAPSULE | Freq: Every day | ORAL | Status: DC
  Administered 2018-10-23 – 2018-10-31 (×10): 300 mg via ORAL
  Filled 2018-10-23 (×10): qty 1

## 2018-10-23 MED ORDER — CARVEDILOL 12.5 MG PO TABS
12.5000 mg | ORAL_TABLET | Freq: Two times a day (BID) | ORAL | Status: DC
  Administered 2018-10-23 – 2018-11-01 (×18): 12.5 mg via ORAL
  Filled 2018-10-23 (×19): qty 1

## 2018-10-23 MED ORDER — HEPARIN SODIUM (PORCINE) 5000 UNIT/ML IJ SOLN
5000.0000 [IU] | Freq: Three times a day (TID) | INTRAMUSCULAR | Status: DC
  Administered 2018-10-23 – 2018-10-24 (×4): 5000 [IU] via SUBCUTANEOUS
  Filled 2018-10-23 (×4): qty 1

## 2018-10-23 MED ORDER — AMMONIUM LACTATE 12 % EX LOTN
TOPICAL_LOTION | Freq: Two times a day (BID) | CUTANEOUS | Status: DC
  Administered 2018-10-23 – 2018-10-31 (×11): via TOPICAL
  Filled 2018-10-23: qty 225

## 2018-10-23 MED ORDER — ATORVASTATIN CALCIUM 10 MG PO TABS
10.0000 mg | ORAL_TABLET | Freq: Every day | ORAL | Status: DC
  Administered 2018-10-23 – 2018-10-31 (×10): 10 mg via ORAL
  Filled 2018-10-23 (×11): qty 1

## 2018-10-23 MED ORDER — PANTOPRAZOLE SODIUM 40 MG PO TBEC
40.0000 mg | DELAYED_RELEASE_TABLET | Freq: Every day | ORAL | Status: DC
  Administered 2018-10-23 – 2018-11-01 (×10): 40 mg via ORAL
  Filled 2018-10-23 (×10): qty 1

## 2018-10-23 MED ORDER — POLYETHYLENE GLYCOL 3350 17 G PO PACK
17.0000 g | PACK | Freq: Every day | ORAL | Status: DC | PRN
  Administered 2018-10-28: 17 g via ORAL
  Filled 2018-10-23: qty 1

## 2018-10-23 MED ORDER — RENA-VITE PO TABS
1.0000 | ORAL_TABLET | Freq: Every day | ORAL | Status: DC
  Administered 2018-10-23 – 2018-10-31 (×10): 1 via ORAL
  Filled 2018-10-23 (×10): qty 1

## 2018-10-23 MED ORDER — NEPRO/CARBSTEADY PO LIQD: 237.0000 mL | Freq: Two times a day (BID) | ORAL | Status: DC

## 2018-10-23 MED ORDER — HYDROCODONE-ACETAMINOPHEN 5-325 MG PO TABS
1.0000 | ORAL_TABLET | Freq: Four times a day (QID) | ORAL | Status: DC | PRN
  Administered 2018-10-23 – 2018-10-31 (×16): 1 via ORAL
  Filled 2018-10-23 (×15): qty 1

## 2018-10-23 NOTE — Consult Note (Signed)
[facility] Nurse wound consult note Reason for Consult:Right foot wound, recent revascularization and osteomylitis and S/P metatarsal amputation on right foot. Wound type:trauma and vascular insufficiency  Pressure Injury POA: NA Measurement: Dorsal foot has  4cm x 3cm calloused area. Distal end of foot where amputation site is has 3cm x 2cm skin flap with pink wound bed underneath, full thickness. Plantar surface of foot has 6cm x 6cm area where callous has peeled off, skin in this area is also calloused. Entire foot is extremely dry. Wound bed:see above Drainage (amount, consistency, odor) none Periwound:calloused and dry Dressing procedure/placement/frequency:I have provided nurses with orders for Betadine BID to skin flap at distal foot, Amlactin to entire foot to hydrate. Bedside nurse to assess daily for fluctuant or induration, or any signs of infection or deterioration, please re-consult if needed. At this time we will not follow, please re-consult if wound deteriorates.  Fara Olden, RN-C, WTA-C, [facility] Associate

## 2018-10-23 NOTE — Evaluation (Signed)
Physical Therapy Evaluation Patient Details Name: [name] MRN: [id number] DOB: 1961/03/18 Today's Date: 10/23/2018   History of Present Illness   Yale A Drewes is a 57 y.o. male with medical history significant of insulin-dependent diabetes mellitus, left BKA, right metatarsal foot amputation, peripheral neuropathy, hypertension, hyperlipidemia, tobacco abuse ESRD started on dialysis 3 weeks ago, recent history of lower extremity osteomyelitis presenting to the hospital for evaluation of hypotension at dialysis today.  Patient was sent to the ED via EMS from dialysis.  Patient reports having pain in his right foot which started a few days after his recent hospital discharge.  States some days he is unable to bear weight on that leg.  States he started noticing a few days ago that the bottom of his right foot has been peeling.  Clinical Impression  Pt admitted with above diagnosis. Pt currently with functional limitations due to the deficits listed below (see PT Problem List). Pt was able to ambulate with RW with good safety overall.  Limited distance by right foot pain.  Pt states he has good and bad days with his pain.  May be close to baseline but would like to work on increasing endurance and gait distance.   Pt will benefit from skilled PT to increase their independence and safety with mobility to allow discharge to the venue listed below.      Follow Up Recommendations Home health PT;Supervision/Assistance - 24 hour    Equipment Recommendations  None recommended by PT(States his wheelchair is torn up but it has not been 5 years)    Recommendations for Other Services       Precautions / Restrictions Precautions Precautions: Fall Required Braces or Orthoses: Other Brace/Splint Other Brace/Splint: right AFO and left prosthesis Restrictions Weight Bearing Restrictions: No      Mobility  Bed Mobility Overal bed mobility: Independent                 Transfers Overall transfer level: Modified independent                  Ambulation/Gait Ambulation/Gait assistance: Min guard;Min assist Gait Distance (Feet): 40 Feet Assistive device: Rolling walker (2 wheeled);Straight cane Gait Pattern/deviations: Step-to pattern;Decreased step length - right;Decreased step length - left;Decreased dorsiflexion - right;Decreased weight shift to right;Decreased stance time - right;Drifts right/left;Trunk flexed;Antalgic   Gait velocity interpretation: <1.31 ft/sec, indicative of household ambulator General Gait Details: Pt donned prosthesis independently.  Pt was able to ambulate with cane with min assist as pt with flexed posture and just did not have enough support due to right foot pain.  Obtained RW and pt did better with this however did fatigue quickly and as he fatigues bends forward.  Pt needed cues for safety when right foot pain increased.    Stairs            Wheelchair Mobility    Modified Rankin (Stroke Patients Only)       Balance Overall balance assessment: Needs assistance Sitting-balance support: No upper extremity supported;Feet supported Sitting balance-Leahy Scale: Fair     Standing balance support: Bilateral upper extremity supported;During functional activity Standing balance-Leahy Scale: Poor Standing balance comment: relies on UE support                              Pertinent Vitals/Pain Pain Assessment: Faces Faces Pain Scale: Hurts worst Pain Location: right foot with weight bearing Pain Descriptors / Indicators: Aching;Grimacing;Guarding  Pain Intervention(s): Limited activity within patient's tolerance;Monitored during session;Repositioned    Home Living Family/patient expects to be discharged to:: Private residence Living Arrangements: Spouse/significant other Available Help at Discharge: Family;Available PRN/intermittently Type of Home: House Home Access: Level entry     Home  Layout: One level Home Equipment: Cane - quad;Cane - single point;Walker - 2 wheels;Wheelchair - [employer];Tub bench      Prior Function Level of Independence: Independent         Comments: Completely independent, driving, working (desktop support) until 4 weeks ago     Hand Dominance   Dominant Hand: Right    Extremity/Trunk Assessment   Upper Extremity Assessment Upper Extremity Assessment: Defer to OT evaluation    Lower Extremity Assessment Lower Extremity Assessment: RLE deficits/detail;[name] deficits/detail RLE Deficits / Details: ankle 3/5, knee 3+/5, hip 4/5 [name] Deficits / Details: knee 4/5, hip 4/5    Cervical / Trunk Assessment Cervical / Trunk Assessment: Normal  Communication   Communication: No difficulties  Cognition Arousal/Alertness: Awake/alert Behavior During Therapy: WFL for tasks assessed/performed Overall Cognitive Status: Within Functional Limits for tasks assessed                                        General Comments      Exercises General Exercises - Lower Extremity Long Arc Quad: AROM;Both;10 reps;Seated   Assessment/Plan    PT Assessment Patient needs continued PT services  PT Problem List Decreased activity tolerance;Decreased balance;Decreased mobility;Decreased knowledge of use of DME;Decreased safety awareness;Decreased knowledge of precautions;Cardiopulmonary status limiting activity;Pain;Decreased skin integrity       PT Treatment Interventions DME instruction;Gait training;Functional mobility training;Therapeutic activities;Therapeutic exercise;Balance training;Patient/family education    PT Goals (Current goals can be found in the Care Plan section)  Acute Rehab PT Goals Patient Stated Goal: to get right foot to quit hurting PT Goal Formulation: With patient Time For Goal Achievement: 11/06/18 Potential to Achieve Goals: Good    Frequency Min 3X/week   Barriers to discharge Decreased  caregiver support      Co-evaluation               AM-PAC PT "6 Clicks" Daily Activity  Outcome Measure Difficulty turning over in bed (including adjusting bedclothes, sheets and blankets)?: None Difficulty moving from lying on back to sitting on the side of the bed? : None Difficulty sitting down on and standing up from a chair with arms (e.g., wheelchair, bedside commode, etc,.)?: None Help needed moving to and from a bed to chair (including a wheelchair)?: A Little Help needed walking in hospital room?: A Little Help needed climbing 3-5 steps with a railing? : A Little 6 Click Score: 21    End of Session Equipment Utilized During Treatment: Gait belt Activity Tolerance: Patient limited by fatigue;Patient limited by pain Patient left: in bed;with call bell/phone within reach;with bed alarm set Nurse Communication: Mobility status PT Visit Diagnosis: Unsteadiness on feet (R26.81);Muscle weakness (generalized) (M62.81);Pain Pain - Right/Left: Right Pain - part of body: Ankle and joints of foot    Time: 0943-1010 PT Time Calculation (min) (ACUTE ONLY): 27 min   Charges:   PT Evaluation $PT Eval Moderate Complexity: 1 Mod PT Treatments $Gait Training: 8-22 mins        [place] Pager:  [phone]  Office:  [facility] 10/23/2018, 2:46 PM

## 2018-10-23 NOTE — Progress Notes (Signed)
PROGRESS NOTE    [name]  WIO:[id number] DOB: 04-May-1961 DOA: 10/22/2018 PCP: System, Pcp Not In    Brief Narrative:  57 y.o. male with medical history significant of insulin-dependent diabetes mellitus, left BKA, right metatarsal foot amputation, peripheral neuropathy, hypertension, hyperlipidemia, tobacco abuse ESRD started on dialysis 3 weeks ago, recent history of lower extremity osteomyelitis and recent amputation presenting to the hospital for evaluation of hypotension at dialysis today.  Patient was sent to the ED via EMS from dialysis.  Per ED report, patient completed entire treatment.  When they stood him up to weigh him at dialysis he had a syncopal/near syncopal episode and became hypotensive.  He was given 650 cc bolus prior to arrival.  Patient reports having pain in his right foot which started a few days after his recent hospital discharge.  States some days he is unable to bear weight on that leg.  States he started noticing a few days ago that the bottom of his right foot has been peeling.  Denies having any fevers, chills, nausea, or vomiting.  States his appetite has been reduced since his prior hospitalization.  He did finish a full course of doxycycline after his recent discharge.  Patient was recently admitted from September 09, 2018 to September 20 2018.  He was transitioned to hemodialysis during this hospitalization.  MRI of his right stump revealed early osteomyelitis.  MRSA screening positive.  He was treated with 10 days of IV antibiotics including vancomycin.  Vascular surgery was consulted and patient underwent revascularization of his right lower extremity in September 16, 2018.  Hospital course was complicated by intermittent severe pain in his right stump.  Infectious disease was consulted and recommended doxycycline for 5 days.  ED Course: Hemodynamically stable.  White count 16.2; was 16.8 a month ago and has previously been elevated as well.  Lactic acid  normal.  Chest x-ray showing no acute cardiopulmonary process.  X-ray of right foot showing evidence of soft tissue air in the stump region as well as lateral to the fifth metatarsal remnant; no bony destruction evident.  Patient received 500 cc normal saline bolus, Norco, and vancomycin in the ED.  Assessment & Plan:   Principal Problem:   Right foot pain Active Problems:   HTN (hypertension)   Type 2 diabetes mellitus (HCC)   HLD (hyperlipidemia)   PVD (peripheral vascular disease) (HCC)   Hypokalemia   ESRD (end stage renal disease) (HCC)   Chronic anemia   Ambulatory dysfunction   GERD (gastroesophageal reflux disease)  Right foot (stump) pain, suspect 2/2 infection/ osteomyelitis -Recently admitted for right lower extremity cellulitis with early changes of osteomyelitis on MRI.  MRSA screening was positive.  He was treated with 10 days of IV antibiotics including vancomycin and treated with a 5-day course of doxycycline. He underwent revascularization of his right lower extremity in September 16, 2018.   -Currently afebrile and hemodynamically stable.  White count 16.2; was 16.8 a month ago and has previously been elevated as well.  Lactic acid normal.  X-ray of right foot showing evidence of soft tissue air in the stump region as well as lateral to the fifth metatarsal remnant; no bony destruction evident. -Continued on IV Vancomycin -R foot MRI reviewed with patient and Orthopedic Surgery. Concerns of osteomyelitis noted -Pain management: Norco 5-325 mg 1 tablet every 6 hours as needed -Wound care consulted -Will repeat CBC in AM -Consulted orthopedic surgery  Peripheral vascular disease Status post revascularization of right lower  extremity in September 16, 2018. -Continue home Plavix, Lipitor as tolerated  Hypokalemia -Potassium 3.2, on HD today -Continue to follow electrolytes  New end-stage renal disease on dialysis TTS -Nephrology consulted and following -Continue HD  as tolerated  Anemia of chronic disease/iron deficiency anemia -Hemoglobin 8.2 at presentation, 7.3 today -Continue to follow, transfuse as needed  Ambulatory dysfunction due to amputation/right foot pain -PT consultation  Hypertension -Continue home Coreg as tolerated -BP stable  Hyperlipidemia -Continue home Lipitor as tolerated, stable  Type 2 diabetes A1c 7.5 on September 10, 2018.  Most recent CBG 329. -Continue home Lantus 15 units twice daily -Sliding scale insulin sensitive -glucose stable at present  GERD -Continue PPI as tolerated -Currently stable  DVT prophylaxis: Heparin subQ Code Status: Full Family Communication: Pt in room, family not at bedside Disposition Plan: Uncertain at this time  Consultants:   Nephrology  Orthopedic Surgery  Procedures:     Antimicrobials: Anti-infectives (From admission, onward)   Start     Dose/Rate Route Frequency Ordered Stop   10/24/18 1200  vancomycin (VANCOCIN) IVPB 1000 mg/200 mL premix     1,000 mg 200 mL/hr over 60 Minutes Intravenous Every T-Th-Sa (Hemodialysis) 10/22/18 2223     10/22/18 2215  vancomycin (VANCOCIN) 2,000 mg in sodium chloride 0.9 % 500 mL IVPB     2,000 mg 250 mL/hr over 120 Minutes Intravenous  Once 10/22/18 2209 10/23/18 0131       Subjective: Feeling down about possibility of needing more surgery  Objective: Vitals:   10/22/18 2341 10/23/18 0536 10/23/18 0854 10/23/18 1429  BP: (!) 150/80 (!) 145/78 136/66 136/66  Pulse: 82 72 79 79  Resp: 16 18 18 18   Temp: 99.1 F (37.3 C) 98.4 F (36.9 C) 98.3 F (36.8 C) 98.3 F (36.8 C)  TempSrc: Oral Oral Oral Oral  SpO2: 100% 99% 98%   Weight:    96.6 kg  Height:    6' (1.829 m)    Intake/Output Summary (Last 24 hours) at 10/23/2018 1628 Last data filed at 10/23/2018 1300 Gross per 24 hour  Intake 410 ml  Output -  Net 410 ml   Filed Weights   10/23/18 1429  Weight: 96.6 kg    Examination:  General exam: Appears  calm and comfortable  Respiratory system: Clear to auscultation. Respiratory effort normal. Cardiovascular system: S1 & S2 heard, RRR Gastrointestinal system: Abdomen is nondistended, soft and nontender. No organomegaly or masses felt. Normal bowel sounds heard. Central nervous system: Alert and oriented. No focal neurological deficits. Extremities: Symmetric 5 x 5 power. Skin: R foot with dehisced anterior wound, no active drainage, surrounding mild erythema, [name] amputation  Psychiatry: Judgement and insight appear normal. Mood & affect appropriate.   Data Reviewed: I have personally reviewed following labs and imaging studies  CBC: Recent Labs  Lab 10/22/18 1852 10/23/18 0640  WBC 16.2* 11.4*  NEUTROABS 13.2*  --   HGB 8.2* 7.3*  HCT 28.4* 25.6*  MCV 77.2* 76.9*  PLT 167 326   Basic Metabolic Panel: Recent Labs  Lab 10/22/18 1852 10/23/18 0640  NA 137 137  K 3.0* 3.2*  CL 98 99  CO2 27 26  GLUCOSE 220* 278*  BUN 26* 36*  CREATININE 3.48* 4.58*  CALCIUM 7.9* 7.8*  MG  --  1.8   GFR: Estimated Creatinine Clearance: 20.9 mL/min (A) (by C-G formula based on SCr of 4.58 mg/dL (H)). Liver Function Tests: No results for input(s): AST, ALT, ALKPHOS, BILITOT, PROT, ALBUMIN in  the last 168 hours. No results for input(s): LIPASE, AMYLASE in the last 168 hours. No results for input(s): AMMONIA in the last 168 hours. Coagulation Profile: No results for input(s): INR, PROTIME in the last 168 hours. Cardiac Enzymes: No results for input(s): CKTOTAL, CKMB, CKMBINDEX, TROPONINI in the last 168 hours. BNP (last 3 results) No results for input(s): PROBNP in the last 8760 hours. HbA1C: No results for input(s): HGBA1C in the last 72 hours. CBG: Recent Labs  Lab 10/22/18 2330 10/23/18 0535 10/23/18 0739 10/23/18 1130 10/23/18 1617  GLUCAP 329* 290* 237* 297* 300*   Lipid Profile: No results for input(s): CHOL, HDL, LDLCALC, TRIG, CHOLHDL, LDLDIRECT in the last 72  hours. Thyroid Function Tests: No results for input(s): TSH, T4TOTAL, FREET4, T3FREE, THYROIDAB in the last 72 hours. Anemia Panel: No results for input(s): VITAMINB12, FOLATE, FERRITIN, TIBC, IRON, RETICCTPCT in the last 72 hours. Sepsis Labs: Recent Labs  Lab 10/22/18 1906  LATICACIDVEN 1.84    Recent Results (from the past 240 hour(s))  MRSA PCR Screening     Status: None   Collection Time: 10/23/18  9:47 AM  Result Value Ref Range Status   MRSA by PCR NEGATIVE NEGATIVE Final    Comment:        The GeneXpert MRSA Assay (FDA approved for NASAL specimens only), is one component of a comprehensive MRSA colonization surveillance program. It is not intended to diagnose MRSA infection nor to guide or monitor treatment for MRSA infections. Performed at [facility], [place] [address], [place], [place] [address]      Radiology Studies: Dg Chest 2 View  Result Date: 10/22/2018 CLINICAL DATA:  Near syncope. History of diabetes and chronic kidney disease. EXAM: CHEST - 2 VIEW COMPARISON:  Chest radiograph September 13, 2018 FINDINGS: Cardiomediastinal silhouette is normal. No pleural effusions or focal consolidations. Trachea projects midline and there is no pneumothorax. Tunneled dialysis catheter via RIGHT internal jugular venous approach with distal tip projecting distal superior vena cava. Soft tissue planes and included osseous structures are non-suspicious. IMPRESSION: No acute cardiopulmonary process. Electronically Signed   By: Elon Alas M.D.   On: 10/22/2018 20:56   Mr Foot Right Wo Contrast  Result Date: 10/23/2018 CLINICAL DATA:  Wound infection. Status post amputation with open wound. EXAM: MRI OF THE RIGHT FOREFOOT WITHOUT CONTRAST TECHNIQUE: Multiplanar, multisequence MR imaging of the right foot was performed. No intravenous contrast was administered. COMPARISON:  Radiographs 10/22/2018 and prior MRI 09/11/2018 FINDINGS: Bones/Joint/Cartilage There is an open  wound involving the plantar aspect of the foot at the amputation site. Focal cellulitis without discrete drainable soft tissue abscess. Gas is noted in the soft tissues. There is also abnormal T1 and T2 signal intensity in the base of the fifth metatarsal and also in the base of the fourth metatarsal consistent with osteomyelitis. No other areas of osteomyelitis are identified. No findings to suggest septic arthritis. Mild myositis involving the short flexor muscles of the foot. Chronic Achilles tendinopathy. Plantar fascia is intact. IMPRESSION: 1. Open wound on the plantar and lateral aspect of the midfoot with cellulitis and myositis but no discrete drainable soft tissue abscess. 2. MR findings consistent with osteomyelitis involving the bases of the fourth and fifth metatarsals. 3. Chronic Achilles tendinopathy. Electronically Signed   By: Marijo Sanes M.D.   On: 10/23/2018 07:38   Dg Foot Complete Right  Result Date: 10/22/2018 CLINICAL DATA:  Nonhealing wound. EXAM: RIGHT FOOT COMPLETE - 3+ VIEW COMPARISON:  Right foot  MRI September 11, 2018 FINDINGS: Frontal, oblique and lateral views were obtained. Patient has had amputations at the levels of the proximal metatarsals, unchanged. There is evidence soft tissue air in the stump region as well as lateral to the fifth metatarsal remnant. No acute fracture or dislocation is evident. There is no erosive change or bony destruction. There is extensive arterial vascular calcification. IMPRESSION: Air within the distal soft tissues raises concern for soft tissue infection. No bony destruction evident. No acute fracture or dislocation. Amputation sites as noted. Extensive arterial vascular calcification throughout the foot noted. Electronically Signed   By: Lowella Grip III M.D.   On: 10/22/2018 20:55    Scheduled Meds: . ammonium lactate   Topical BID  . aspirin EC  81 mg Oral Daily  . atorvastatin  10 mg Oral QHS  . carvedilol  12.5 mg Oral BID WC  .  clopidogrel  75 mg Oral Daily  . gabapentin  300 mg Oral QHS  . heparin  5,000 Units Subcutaneous Q8H  . insulin aspart  0-9 Units Subcutaneous TID WC  . insulin glargine  15 Units Subcutaneous BID  . multivitamin  1 tablet Oral QHS  . pantoprazole  40 mg Oral Daily   Continuous Infusions: . [START ON 10/24/2018] vancomycin       LOS: 1 day   Marylu Lund, MD [facility] Pager On Amion  If 7PM-7AM, please contact night-coverage 10/23/2018, 4:28 PM

## 2018-10-23 NOTE — Progress Notes (Signed)
Per [place] nurse instructions. Painted right foot with betadine, and left open to air. Will continue to monitor.   Farley Ly RN

## 2018-10-24 ENCOUNTER — Other Ambulatory Visit (HOSPITAL_COMMUNITY): Payer: Self-pay | Admitting: Orthopedic Surgery

## 2018-10-24 DIAGNOSIS — I9589 Other hypotension: Secondary | ICD-10-CM

## 2018-10-24 DIAGNOSIS — Z992 Dependence on renal dialysis: Secondary | ICD-10-CM

## 2018-10-24 DIAGNOSIS — I10 Essential (primary) hypertension: Secondary | ICD-10-CM

## 2018-10-24 DIAGNOSIS — E861 Hypovolemia: Secondary | ICD-10-CM

## 2018-10-24 DIAGNOSIS — N186 End stage renal disease: Secondary | ICD-10-CM

## 2018-10-24 DIAGNOSIS — T82898A Other specified complication of vascular prosthetic devices, implants and grafts, initial encounter: Secondary | ICD-10-CM

## 2018-10-24 LAB — RENAL FUNCTION PANEL
ANION GAP: 9 (ref 5–15)
Albumin: 2.2 g/dL — ABNORMAL LOW (ref 3.5–5.0)
BUN: 43 mg/dL — ABNORMAL HIGH (ref 6–20)
CALCIUM: 7.9 mg/dL — AB (ref 8.9–10.3)
CO2: 25 mmol/L (ref 22–32)
CREATININE: 4.55 mg/dL — AB (ref 0.61–1.24)
Chloride: 102 mmol/L (ref 98–111)
GFR, EST AFRICAN AMERICAN: 15 mL/min — AB (ref >60)
GFR, EST NON AFRICAN AMERICAN: 13 mL/min — AB (ref >60)
Glucose, Bld: 182 mg/dL — ABNORMAL HIGH (ref 70–99)
PHOSPHORUS: 3.8 mg/dL (ref 2.5–4.6)
Potassium: 3.4 mmol/L — ABNORMAL LOW (ref 3.5–5.1)
SODIUM: 136 mmol/L (ref 135–145)

## 2018-10-24 LAB — GLUCOSE, CAPILLARY
GLUCOSE-CAPILLARY: 259 mg/dL — AB (ref 70–99)
GLUCOSE-CAPILLARY: 277 mg/dL — AB (ref 70–99)
GLUCOSE-CAPILLARY: 147 mg/dL — AB (ref 70–99)
Glucose-Capillary: 234 mg/dL — ABNORMAL HIGH (ref 70–99)

## 2018-10-24 LAB — CBC
HCT: 25.3 % — ABNORMAL LOW (ref 39.0–52.0)
HEMOGLOBIN: 7.1 g/dL — AB (ref 13.0–17.0)
MCH: 21.8 pg — ABNORMAL LOW (ref 26.0–34.0)
MCHC: 28.1 g/dL — AB (ref 30.0–36.0)
MCV: 77.6 fL — ABNORMAL LOW (ref 80.0–100.0)
Platelets: 226 10*3/uL (ref 150–400)
RBC: 3.26 MIL/uL — AB (ref 4.22–5.81)
RDW: 15.9 % — ABNORMAL HIGH (ref 11.5–15.5)
WBC: 12.3 10*3/uL — AB (ref 4.0–10.5)
nRBC: 0 % (ref 0.0–0.2)

## 2018-10-24 MED ORDER — CEFAZOLIN SODIUM-DEXTROSE 2-4 GM/100ML-% IV SOLN
2.0000 g | INTRAVENOUS | Status: AC
  Administered 2018-10-25: 2 g via INTRAVENOUS
  Filled 2018-10-24: qty 100

## 2018-10-24 MED ORDER — CALCITRIOL 0.5 MCG PO CAPS
ORAL_CAPSULE | ORAL | Status: AC
  Administered 2018-10-24: 0.5 ug via ORAL
  Filled 2018-10-24: qty 1

## 2018-10-24 MED ORDER — DARBEPOETIN ALFA 200 MCG/0.4ML IJ SOSY
PREFILLED_SYRINGE | INTRAMUSCULAR | Status: AC
  Administered 2018-10-24: 16:00:00
  Filled 2018-10-24: qty 0.4

## 2018-10-24 MED ORDER — DARBEPOETIN ALFA 200 MCG/0.4ML IJ SOSY
200.0000 ug | PREFILLED_SYRINGE | INTRAMUSCULAR | Status: DC
  Filled 2018-10-24: qty 0.4

## 2018-10-24 MED ORDER — ASPIRIN EC 81 MG PO TBEC
81.0000 mg | DELAYED_RELEASE_TABLET | Freq: Every day | ORAL | Status: DC
  Administered 2018-10-27 – 2018-11-01 (×6): 81 mg via ORAL
  Filled 2018-10-24 (×6): qty 1

## 2018-10-24 MED ORDER — CLOPIDOGREL BISULFATE 75 MG PO TABS: 75.0000 mg | ORAL_TABLET | Freq: Every day | ORAL | Status: DC

## 2018-10-24 MED ORDER — CALCITRIOL 0.5 MCG PO CAPS
0.5000 ug | ORAL_CAPSULE | ORAL | Status: DC
  Administered 2018-10-24 – 2018-10-28 (×2): 0.5 ug via ORAL

## 2018-10-24 MED ORDER — ASPIRIN EC 81 MG PO TBEC: 81.0000 mg | DELAYED_RELEASE_TABLET | Freq: Every day | ORAL | Status: DC

## 2018-10-24 MED ORDER — HEPARIN SODIUM (PORCINE) 5000 UNIT/ML IJ SOLN
5000.0000 [IU] | Freq: Three times a day (TID) | INTRAMUSCULAR | Status: AC
  Administered 2018-10-24: 5000 [IU] via SUBCUTANEOUS
  Filled 2018-10-24 (×2): qty 1

## 2018-10-24 MED ORDER — HEPARIN SODIUM (PORCINE) 1000 UNIT/ML IJ SOLN
INTRAMUSCULAR | Status: AC
  Administered 2018-10-24: 3400 [IU]
  Filled 2018-10-24: qty 4

## 2018-10-24 NOTE — Progress Notes (Addendum)
PROGRESS NOTE    [name]  TMH:[id number] DOB: [birth year] DOA: 10/22/2018 PCP: System, Pcp Not In    Brief Narrative:  57 y.o. male with medical history significant of insulin-dependent diabetes mellitus, left BKA, right metatarsal foot amputation, peripheral neuropathy, hypertension, hyperlipidemia, tobacco abuse ESRD started on dialysis 3 weeks ago, recent history of lower extremity osteomyelitis and recent amputation presenting to the hospital for evaluation of hypotension at dialysis today.  Patient was sent to the ED via EMS from dialysis.  Per ED report, patient completed entire treatment.  When they stood him up to weigh him at dialysis he had a syncopal/near syncopal episode and became hypotensive.  He was given 650 cc bolus prior to arrival.  Patient reports having pain in his right foot which started a few days after his recent hospital discharge.  States some days he is unable to bear weight on that leg.  States he started noticing a few days ago that the bottom of his right foot has been peeling.  Denies having any fevers, chills, nausea, or vomiting.  States his appetite has been reduced since his prior hospitalization.  He did finish a full course of doxycycline after his recent discharge.  Patient was recently admitted from September 09, 2018 to September 20 2018.  He was transitioned to hemodialysis during this hospitalization.  MRI of his right stump revealed early osteomyelitis.  MRSA screening positive.  He was treated with 10 days of IV antibiotics including vancomycin.  Vascular surgery was consulted and patient underwent revascularization of his right lower extremity in September 16, 2018.  Hospital course was complicated by intermittent severe pain in his right stump.  Infectious disease was consulted and recommended doxycycline for 5 days.  ED Course: Hemodynamically stable.  White count 16.2; was 16.8 a month ago and has previously been elevated as well.  Lactic acid  normal.  Chest x-ray showing no acute cardiopulmonary process.  X-ray of right foot showing evidence of soft tissue air in the stump region as well as lateral to the fifth metatarsal remnant; no bony destruction evident.  Patient received 500 cc normal saline bolus, Norco, and vancomycin in the ED.  Assessment & Plan:   Principal Problem:   Right foot pain Active Problems:   HTN (hypertension)   Type 2 diabetes mellitus (HCC)   HLD (hyperlipidemia)   PVD (peripheral vascular disease) (HCC)   Hypokalemia   ESRD (end stage renal disease) (HCC)   Chronic anemia   Ambulatory dysfunction   GERD (gastroesophageal reflux disease)  Right foot (stump) pain, suspect 2/2 infection/ osteomyelitis -Recently admitted for right lower extremity cellulitis with early changes of osteomyelitis on MRI.  MRSA screening was positive.  He was treated with 10 days of IV antibiotics including vancomycin and treated with a 5-day course of doxycycline. He underwent revascularization of his right lower extremity in September 16, 2018.   -Currently afebrile and hemodynamically stable.  White count 16.2; was 16.8 a month ago and has previously been elevated as well.  Lactic acid normal.  X-ray of right foot showing evidence of soft tissue air in the stump region as well as lateral to the fifth metatarsal remnant; no bony destruction evident. -Continued on IV Vancomycin -R foot MRI reviewed with patient and Orthopedic Surgery. Concerns of osteomyelitis noted -Pain management: Norco 5-325 mg 1 tablet every 6 hours as needed -Wound care consulted -Orthopedic Surgery following with plans for surgery 11/23  Peripheral vascular disease Status post revascularization of right lower  extremity in September 16, 2018. -Continue home Plavix, Lipitor as tolerated -No evidence of acute bleeding  Hypokalemia -Continue to follow electrolytes and correct as needed  New end-stage renal disease on dialysis TTS -Nephrology consulted  and following -Continue HD as tolerated -Vascular Surgery following, placed RIJ tunneled cath 11/22  Anemia of chronic disease/iron deficiency anemia -Hemoglobin 8.2 at presentation. Remains hemodynamically stable -Continue to follow, transfuse as needed  Ambulatory dysfunction due to amputation/right foot pain -PT consulted -Stable  Hypertension -Continue home Coreg as tolerated -BP stable at this time  Hyperlipidemia -Continue home Lipitor as tolerated, stable  Type 2 diabetes A1c 7.5 on September 10, 2018.  Most recent CBG 329. -Continue home Lantus 15 units twice daily -Sliding scale insulin sensitive -stable glucose at this time  GERD -Continue PPI as tolerated -Currently stable  DVT prophylaxis: Heparin subQ Code Status: Full Family Communication: Pt in room, family not at bedside Disposition Plan: Uncertain at this time  Consultants:   Nephrology  Orthopedic Surgery  Procedures:     Antimicrobials: Anti-infectives (From admission, onward)   Start     Dose/Rate Route Frequency Ordered Stop   10/25/18 0000  ceFAZolin (ANCEF) IVPB 1 g/50 mL premix    Note to Pharmacy:  Send with pt to OR   1 g 100 mL/hr over 30 Minutes Intravenous On call 10/24/18 1508 10/26/18 0000   10/24/18 1200  vancomycin (VANCOCIN) IVPB 1000 mg/200 mL premix     1,000 mg 200 mL/hr over 60 Minutes Intravenous Every T-Th-Sa (Hemodialysis) 10/22/18 2223     10/22/18 2215  vancomycin (VANCOCIN) 2,000 mg in sodium chloride 0.9 % 500 mL IVPB     2,000 mg 250 mL/hr over 120 Minutes Intravenous  Once 10/22/18 2209 10/23/18 0131      Subjective: No complaints at this time  Objective: Vitals:   10/24/18 1330 10/24/18 1400 10/24/18 1430 10/24/18 1500  BP: 120/67 107/66 117/65 110/63  Pulse: 77 80 74 75  Resp:      Temp:      TempSrc:      SpO2:      Weight:      Height:        Intake/Output Summary (Last 24 hours) at 10/24/2018 1525 Last data filed at 10/24/2018  0900 Gross per 24 hour  Intake 610 ml  Output 200 ml  Net 410 ml   Filed Weights   10/23/18 1429 10/23/18 2051 10/24/18 1247  Weight: 96.6 kg 96.6 kg 94.5 kg    Examination: General exam: Conversant, in no acute distress Respiratory system: normal chest rise, clear, no audible wheezing Extremities: s/p [name] amputation, partial R foot amputation  Data Reviewed: I have personally reviewed following labs and imaging studies  CBC: Recent Labs  Lab 10/22/18 1852 10/23/18 0640 10/24/18 0634  WBC 16.2* 11.4* 12.3*  NEUTROABS 13.2*  --   --   HGB 8.2* 7.3* 7.1*  HCT 28.4* 25.6* 25.3*  MCV 77.2* 76.9* 77.6*  PLT 167 163 185   Basic Metabolic Panel: Recent Labs  Lab 10/22/18 1852 10/23/18 0640 10/24/18 1334  NA 137 137 136  K 3.0* 3.2* 3.4*  CL 98 99 102  CO2 27 26 25   GLUCOSE 220* 278* 182*  BUN 26* 36* 43*  CREATININE 3.48* 4.58* 4.55*  CALCIUM 7.9* 7.8* 7.9*  MG  --  1.8  --   PHOS  --   --  3.8   GFR: Estimated Creatinine Clearance: 20.9 mL/min (A) (by C-G formula based on  SCr of 4.55 mg/dL (H)). Liver Function Tests: Recent Labs  Lab 10/24/18 1334  ALBUMIN 2.2*   No results for input(s): LIPASE, AMYLASE in the last 168 hours. No results for input(s): AMMONIA in the last 168 hours. Coagulation Profile: No results for input(s): INR, PROTIME in the last 168 hours. Cardiac Enzymes: No results for input(s): CKTOTAL, CKMB, CKMBINDEX, TROPONINI in the last 168 hours. BNP (last 3 results) No results for input(s): PROBNP in the last 8760 hours. HbA1C: No results for input(s): HGBA1C in the last 72 hours. CBG: Recent Labs  Lab 10/23/18 1130 10/23/18 1617 10/23/18 2051 10/24/18 0755 10/24/18 1121  GLUCAP 297* 300* 188* 259* 277*   Lipid Profile: No results for input(s): CHOL, HDL, LDLCALC, TRIG, CHOLHDL, LDLDIRECT in the last 72 hours. Thyroid Function Tests: No results for input(s): TSH, T4TOTAL, FREET4, T3FREE, THYROIDAB in the last 72 hours. Anemia  Panel: No results for input(s): VITAMINB12, FOLATE, FERRITIN, TIBC, IRON, RETICCTPCT in the last 72 hours. Sepsis Labs: Recent Labs  Lab 10/22/18 1906  LATICACIDVEN 1.84    Recent Results (from the past 240 hour(s))  MRSA PCR Screening     Status: None   Collection Time: 10/23/18  9:47 AM  Result Value Ref Range Status   MRSA by PCR NEGATIVE NEGATIVE Final    Comment:        The GeneXpert MRSA Assay (FDA approved for NASAL specimens only), is one component of a comprehensive MRSA colonization surveillance program. It is not intended to diagnose MRSA infection nor to guide or monitor treatment for MRSA infections. Performed at [facility], [place] [place], [place] [address]      Radiology Studies: Dg Chest 2 View  Result Date: 10/22/2018 CLINICAL DATA:  Near syncope. History of diabetes and chronic kidney disease. EXAM: CHEST - 2 VIEW COMPARISON:  Chest radiograph September 13, 2018 FINDINGS: Cardiomediastinal silhouette is normal. No pleural effusions or focal consolidations. Trachea projects midline and there is no pneumothorax. Tunneled dialysis catheter via RIGHT internal jugular venous approach with distal tip projecting distal superior vena cava. Soft tissue planes and included osseous structures are non-suspicious. IMPRESSION: No acute cardiopulmonary process. Electronically Signed   By: Elon Alas M.D.   On: 10/22/2018 20:56   Mr Foot Right Wo Contrast  Result Date: 10/23/2018 CLINICAL DATA:  Wound infection. Status post amputation with open wound. EXAM: MRI OF THE RIGHT FOREFOOT WITHOUT CONTRAST TECHNIQUE: Multiplanar, multisequence MR imaging of the right foot was performed. No intravenous contrast was administered. COMPARISON:  Radiographs 10/22/2018 and prior MRI 09/11/2018 FINDINGS: Bones/Joint/Cartilage There is an open wound involving the plantar aspect of the foot at the amputation site. Focal cellulitis without discrete drainable soft tissue  abscess. Gas is noted in the soft tissues. There is also abnormal T1 and T2 signal intensity in the base of the fifth metatarsal and also in the base of the fourth metatarsal consistent with osteomyelitis. No other areas of osteomyelitis are identified. No findings to suggest septic arthritis. Mild myositis involving the short flexor muscles of the foot. Chronic Achilles tendinopathy. Plantar fascia is intact. IMPRESSION: 1. Open wound on the plantar and lateral aspect of the midfoot with cellulitis and myositis but no discrete drainable soft tissue abscess. 2. MR findings consistent with osteomyelitis involving the bases of the fourth and fifth metatarsals. 3. Chronic Achilles tendinopathy. Electronically Signed   By: Marijo Sanes M.D.   On: 10/23/2018 07:38   Dg Foot Complete Right  Result Date: 10/22/2018 CLINICAL  DATA:  Nonhealing wound. EXAM: RIGHT FOOT COMPLETE - 3+ VIEW COMPARISON:  Right foot MRI September 11, 2018 FINDINGS: Frontal, oblique and lateral views were obtained. Patient has had amputations at the levels of the proximal metatarsals, unchanged. There is evidence soft tissue air in the stump region as well as lateral to the fifth metatarsal remnant. No acute fracture or dislocation is evident. There is no erosive change or bony destruction. There is extensive arterial vascular calcification. IMPRESSION: Air within the distal soft tissues raises concern for soft tissue infection. No bony destruction evident. No acute fracture or dislocation. Amputation sites as noted. Extensive arterial vascular calcification throughout the foot noted. Electronically Signed   By: Lowella Grip III M.D.   On: 10/22/2018 20:55    Scheduled Meds: . ammonium lactate   Topical BID  . [START ON 10/27/2018] aspirin EC  81 mg Oral Daily  . atorvastatin  10 mg Oral QHS  . calcitRIOL  0.5 mcg Oral Q T,Th,Sa-HD  . carvedilol  12.5 mg Oral BID WC  . [START ON 10/27/2018] clopidogrel  75 mg Oral Daily  .  Darbepoetin Alfa      . darbepoetin (ARANESP) injection - DIALYSIS  200 mcg Intravenous Q Thu-HD  . gabapentin  300 mg Oral QHS  . heparin  5,000 Units Subcutaneous Q8H  . insulin aspart  0-9 Units Subcutaneous TID WC  . insulin glargine  15 Units Subcutaneous BID  . multivitamin  1 tablet Oral QHS  . pantoprazole  40 mg Oral Daily   Continuous Infusions: . [START ON 10/25/2018]  ceFAZolin (ANCEF) IV    . vancomycin       LOS: 2 days   Marylu Lund, MD [facility] Pager On Amion  If 7PM-7AM, please contact night-coverage 10/24/2018, 3:25 PM
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
## 2018-10-24 NOTE — Progress Notes (Signed)
Concerning HD perm. Cath  Malfunction= bfr  250 max on hd currently. Have called  [facility]  For consulting replacement perm cath tomor if possible     Ernest Haber, PA-C [place] [phone] 10/24/2018,2:27 PM  LOS: 2 days

## 2018-10-24 NOTE — Anesthesia Preprocedure Evaluation (Addendum)
Anesthesia Evaluation  Patient identified by MRN, date of birth, ID band Patient awake    Reviewed: Allergy & Precautions, NPO status , Patient's Chart, lab work & pertinent test results  Airway Mallampati: II  TM Distance: >3 FB     Dental  (+) Dental Advisory Given   Pulmonary neg pulmonary ROS, former smoker,    breath sounds clear to auscultation       Cardiovascular hypertension, Pt. on medications and Pt. on home beta blockers + Peripheral Vascular Disease   Rhythm:Regular Rate:Normal     Neuro/Psych negative neurological ROS  negative psych ROS   GI/Hepatic Neg liver ROS, GERD  Medicated,  Endo/Other  negative endocrine ROSdiabetes, Type 2, Insulin Dependent  Renal/GU ESRF and DialysisRenal disease  negative genitourinary   Musculoskeletal negative musculoskeletal ROS (+)   Abdominal   Peds negative pediatric ROS (+)  Hematology  (+) anemia ,   Anesthesia Other Findings   Reproductive/Obstetrics negative OB ROS                             Anesthesia Physical  Anesthesia Plan  ASA: IV  Anesthesia Plan: MAC   Post-op Pain Management:    Induction: Intravenous  PONV Risk Score and Plan: 1 and Ondansetron, Treatment may vary due to age or medical condition and Propofol infusion  Airway Management Planned: Simple Face Mask and Natural Airway  Additional Equipment:   Intra-op Plan:   Post-operative Plan:   Informed Consent: I have reviewed the patients History and Physical, chart, labs and discussed the procedure including the risks, benefits and alternatives for the proposed anesthesia with the patient or authorized representative who has indicated his/her understanding and acceptance.   Dental advisory given  Plan Discussed with: CRNA  Anesthesia Plan Comments:        Anesthesia Quick Evaluation
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
## 2018-10-25 ENCOUNTER — Encounter (HOSPITAL_COMMUNITY): Payer: Self-pay

## 2018-10-25 ENCOUNTER — Inpatient Hospital Stay (HOSPITAL_COMMUNITY): Payer: Medicare Other | Admitting: Anesthesiology

## 2018-10-25 ENCOUNTER — Inpatient Hospital Stay (HOSPITAL_COMMUNITY): Payer: Medicare Other

## 2018-10-25 ENCOUNTER — Encounter (HOSPITAL_COMMUNITY)
Admission: EM | Disposition: A | Discharge status: Rehabilitation Facility | Payer: Self-pay | Source: Home / Self Care | Attending: Internal Medicine

## 2018-10-25 ENCOUNTER — Encounter (HOSPITAL_COMMUNITY): Payer: Self-pay | Admitting: Certified Registered Nurse Anesthetist

## 2018-10-25 HISTORY — PX: EXCHANGE OF A DIALYSIS CATHETER: SHX5818

## 2018-10-25 LAB — GLUCOSE, CAPILLARY
GLUCOSE-CAPILLARY: 158 mg/dL — AB (ref 70–99)
Glucose-Capillary: 172 mg/dL — ABNORMAL HIGH (ref 70–99)
Glucose-Capillary: 183 mg/dL — ABNORMAL HIGH (ref 70–99)
Glucose-Capillary: 218 mg/dL — ABNORMAL HIGH (ref 70–99)
Glucose-Capillary: 183 mg/dL — ABNORMAL HIGH (ref 70–99)
Glucose-Capillary: 191 mg/dL — ABNORMAL HIGH (ref 70–99)

## 2018-10-25 LAB — BASIC METABOLIC PANEL
Anion gap: 9 (ref 5–15)
BUN: 34 mg/dL — ABNORMAL HIGH (ref 6–20)
CO2: 25 mmol/L (ref 22–32)
Calcium: 8.3 mg/dL — ABNORMAL LOW (ref 8.9–10.3)
Chloride: 100 mmol/L (ref 98–111)
Creatinine, Ser: 4.75 mg/dL — ABNORMAL HIGH (ref 0.61–1.24)
GFR calc Af Amer: 14 mL/min — ABNORMAL LOW (ref >60)
GFR calc non Af Amer: 12 mL/min — ABNORMAL LOW (ref >60)
Glucose, Bld: 189 mg/dL — ABNORMAL HIGH (ref 70–99)
Potassium: 3.7 mmol/L (ref 3.5–5.1)
Sodium: 134 mmol/L — ABNORMAL LOW (ref 135–145)

## 2018-10-25 LAB — CBC
HCT: 25.2 % — ABNORMAL LOW (ref 39.0–52.0)
Hemoglobin: 7 g/dL — ABNORMAL LOW (ref 13.0–17.0)
MCH: 21.3 pg — ABNORMAL LOW (ref 26.0–34.0)
MCHC: 27.8 g/dL — ABNORMAL LOW (ref 30.0–36.0)
MCV: 76.8 fL — ABNORMAL LOW (ref 80.0–100.0)
Platelets: 160 10*3/uL (ref 150–400)
RBC: 3.28 MIL/uL — ABNORMAL LOW (ref 4.22–5.81)
RDW: 15.8 % — ABNORMAL HIGH (ref 11.5–15.5)
WBC: 12 10*3/uL — ABNORMAL HIGH (ref 4.0–10.5)
nRBC: 0 % (ref 0.0–0.2)

## 2018-10-25 LAB — SURGICAL PCR SCREEN
MRSA, PCR: NEGATIVE
Staphylococcus aureus: NEGATIVE

## 2018-10-25 LAB — PROTIME-INR
INR: 1.1
Prothrombin Time: 14.2 seconds (ref 11.4–15.2)

## 2018-10-25 LAB — PREPARE RBC (CROSSMATCH)

## 2018-10-25 SURGERY — EXCHANGE OF A DIALYSIS CATHETER
Anesthesia: Monitor Anesthesia Care | Laterality: Right

## 2018-10-25 MED ORDER — LIDOCAINE 2% (20 MG/ML) 5 ML SYRINGE
INTRAMUSCULAR | Status: AC
  Filled 2018-10-25: qty 5

## 2018-10-25 MED ORDER — HEPARIN SODIUM (PORCINE) 1000 UNIT/ML IJ SOLN
INTRAMUSCULAR | Status: DC | PRN
  Administered 2018-10-25: 3400 [IU]

## 2018-10-25 MED ORDER — PENTAFLUOROPROP-TETRAFLUOROETH EX AERO: 1.0000 "application " | INHALATION_SPRAY | CUTANEOUS | Status: DC | PRN

## 2018-10-25 MED ORDER — VANCOMYCIN HCL IN DEXTROSE 1-5 GM/200ML-% IV SOLN
1000.0000 mg | INTRAVENOUS | Status: AC
  Administered 2018-10-25: 1000 mg via INTRAVENOUS
  Filled 2018-10-25: qty 200

## 2018-10-25 MED ORDER — SODIUM CHLORIDE 0.9 % IV SOLN
INTRAVENOUS | Status: DC | PRN
  Administered 2018-10-25: 07:00:00

## 2018-10-25 MED ORDER — SODIUM CHLORIDE 0.9 % IV SOLN
100.0000 mL | INTRAVENOUS | Status: DC | PRN
  Administered 2018-10-26: 11:00:00 via INTRAVENOUS

## 2018-10-25 MED ORDER — PROPOFOL 500 MG/50ML IV EMUL
INTRAVENOUS | Status: DC | PRN
  Administered 2018-10-25: 75 ug/kg/min via INTRAVENOUS

## 2018-10-25 MED ORDER — SODIUM CHLORIDE 0.9% IV SOLUTION: Freq: Once | INTRAVENOUS | Status: DC

## 2018-10-25 MED ORDER — ALTEPLASE 2 MG IJ SOLR
2.0000 mg | Freq: Once | INTRAMUSCULAR | Status: AC | PRN
  Administered 2018-10-28: 3.4 mg
  Filled 2018-10-25: qty 2

## 2018-10-25 MED ORDER — LIDOCAINE HCL (PF) 1 % IJ SOLN: 5.0000 mL | INTRAMUSCULAR | Status: DC | PRN

## 2018-10-25 MED ORDER — VANCOMYCIN HCL IN DEXTROSE 1-5 GM/200ML-% IV SOLN
INTRAVENOUS | Status: AC
  Administered 2018-10-25: 1000 mg via INTRAVENOUS
  Filled 2018-10-25: qty 200

## 2018-10-25 MED ORDER — VANCOMYCIN HCL IN DEXTROSE 1-5 GM/200ML-% IV SOLN: 1000.0000 mg | INTRAVENOUS | Status: DC

## 2018-10-25 MED ORDER — SODIUM CHLORIDE 0.9 % IV SOLN
INTRAVENOUS | Status: DC | PRN
  Administered 2018-10-25: 07:00:00 via INTRAVENOUS

## 2018-10-25 MED ORDER — PROPOFOL 1000 MG/100ML IV EMUL
INTRAVENOUS | Status: AC
  Filled 2018-10-25: qty 100

## 2018-10-25 MED ORDER — HEPARIN SODIUM (PORCINE) 1000 UNIT/ML DIALYSIS
1000.0000 [IU] | INTRAMUSCULAR | Status: DC | PRN
  Administered 2018-10-29: 1000 [IU] via INTRAVENOUS_CENTRAL

## 2018-10-25 MED ORDER — MIDAZOLAM HCL 5 MG/5ML IJ SOLN
INTRAMUSCULAR | Status: DC | PRN
  Administered 2018-10-25: 2 mg via INTRAVENOUS

## 2018-10-25 MED ORDER — HEPARIN SODIUM (PORCINE) 1000 UNIT/ML IJ SOLN
INTRAMUSCULAR | Status: AC
  Filled 2018-10-25: qty 1

## 2018-10-25 MED ORDER — ONDANSETRON HCL 4 MG/2ML IJ SOLN
INTRAMUSCULAR | Status: DC | PRN
  Administered 2018-10-25: 4 mg via INTRAVENOUS

## 2018-10-25 MED ORDER — CHLORHEXIDINE GLUCONATE 4 % EX LIQD
60.0000 mL | Freq: Once | CUTANEOUS | Status: AC
  Administered 2018-10-26: 4 via TOPICAL
  Filled 2018-10-25: qty 60

## 2018-10-25 MED ORDER — LIDOCAINE HCL (PF) 1 % IJ SOLN
INTRAMUSCULAR | Status: DC | PRN
  Administered 2018-10-25: 8 mL

## 2018-10-25 MED ORDER — SODIUM CHLORIDE 0.9 % IV SOLN
INTRAVENOUS | Status: AC
  Filled 2018-10-25: qty 1.2

## 2018-10-25 MED ORDER — LIDOCAINE-EPINEPHRINE (PF) 1 %-1:200000 IJ SOLN
INTRAMUSCULAR | Status: AC
  Filled 2018-10-25: qty 30

## 2018-10-25 MED ORDER — PHENYLEPHRINE 40 MCG/ML (10ML) SYRINGE FOR IV PUSH (FOR BLOOD PRESSURE SUPPORT)
PREFILLED_SYRINGE | INTRAVENOUS | Status: DC | PRN
  Administered 2018-10-25 (×5): 80 ug via INTRAVENOUS

## 2018-10-25 MED ORDER — PROPOFOL 10 MG/ML IV BOLUS
INTRAVENOUS | Status: AC
  Filled 2018-10-25: qty 20

## 2018-10-25 MED ORDER — SODIUM CHLORIDE 0.9 % IV SOLN: 100.0000 mL | INTRAVENOUS | Status: DC | PRN

## 2018-10-25 MED ORDER — PRO-STAT SUGAR FREE PO LIQD
30.0000 mL | Freq: Two times a day (BID) | ORAL | Status: DC
  Administered 2018-10-25 – 2018-11-01 (×13): 30 mL via ORAL
  Filled 2018-10-25 (×13): qty 30

## 2018-10-25 MED ORDER — 0.9 % SODIUM CHLORIDE (POUR BTL) OPTIME
TOPICAL | Status: DC | PRN
  Administered 2018-10-25: 1000 mL

## 2018-10-25 MED ORDER — FENTANYL CITRATE (PF) 250 MCG/5ML IJ SOLN
INTRAMUSCULAR | Status: DC | PRN
  Administered 2018-10-25: 25 ug via INTRAVENOUS
  Administered 2018-10-25: 50 ug via INTRAVENOUS

## 2018-10-25 MED ORDER — LIDOCAINE 2% (20 MG/ML) 5 ML SYRINGE
INTRAMUSCULAR | Status: DC | PRN
  Administered 2018-10-25: 60 mg via INTRAVENOUS

## 2018-10-25 MED ORDER — HEPARIN SODIUM (PORCINE) 1000 UNIT/ML IJ SOLN
INTRAMUSCULAR | Status: AC
  Filled 2018-10-25: qty 4

## 2018-10-25 MED ORDER — MIDAZOLAM HCL 2 MG/2ML IJ SOLN
INTRAMUSCULAR | Status: AC
  Filled 2018-10-25: qty 2

## 2018-10-25 MED ORDER — LIDOCAINE-PRILOCAINE 2.5-2.5 % EX CREA: 1.0000 "application " | TOPICAL_CREAM | CUTANEOUS | Status: DC | PRN

## 2018-10-25 MED ORDER — ONDANSETRON HCL 4 MG/2ML IJ SOLN
INTRAMUSCULAR | Status: AC
  Filled 2018-10-25: qty 2

## 2018-10-25 MED ORDER — LIDOCAINE HCL (PF) 1 % IJ SOLN
INTRAMUSCULAR | Status: AC
  Filled 2018-10-25: qty 30

## 2018-10-25 MED ORDER — POVIDONE-IODINE 10 % EX SWAB: 2.0000 "application " | Freq: Once | CUTANEOUS | Status: DC

## 2018-10-25 MED ORDER — FENTANYL CITRATE (PF) 250 MCG/5ML IJ SOLN
INTRAMUSCULAR | Status: AC
  Filled 2018-10-25: qty 5

## 2018-10-25 SURGICAL SUPPLY — 41 items
BAG DECANTER FOR FLEXI CONT (MISCELLANEOUS) ×3 IMPLANT
BIOPATCH RED 1 DISK 7.0 (GAUZE/BANDAGES/DRESSINGS) ×2 IMPLANT
BIOPATCH RED 1IN DISK 7.0MM (GAUZE/BANDAGES/DRESSINGS) ×1
CATH PALINDROME RT-P 15FX19CM (CATHETERS) IMPLANT
CATH PALINDROME RT-P 15FX23CM (CATHETERS) ×3 IMPLANT
CATH PALINDROME RT-P 15FX28CM (CATHETERS) IMPLANT
CATH PALINDROME RT-P 15FX55CM (CATHETERS) IMPLANT
COVER PROBE W GEL 5X96 (DRAPES) ×3 IMPLANT
COVER SURGICAL LIGHT HANDLE (MISCELLANEOUS) ×3 IMPLANT
COVER WAND RF STERILE (DRAPES) ×3 IMPLANT
DERMABOND ADVANCED (GAUZE/BANDAGES/DRESSINGS) ×2
DERMABOND ADVANCED .7 DNX12 (GAUZE/BANDAGES/DRESSINGS) ×1 IMPLANT
DRAPE C-ARM 42X72 X-RAY (DRAPES) ×3 IMPLANT
DRAPE CHEST BREAST 15X10 FENES (DRAPES) ×3 IMPLANT
GAUZE 4X4 16PLY RFD (DISPOSABLE) ×3 IMPLANT
GLOVE BIO SURGEON STRL SZ 6.5 (GLOVE) ×2 IMPLANT
GLOVE BIO SURGEON STRL SZ7.5 (GLOVE) ×6 IMPLANT
GLOVE BIO SURGEONS STRL SZ 6.5 (GLOVE) ×1
GLOVE BIOGEL PI IND STRL 8 (GLOVE) ×1 IMPLANT
GLOVE BIOGEL PI INDICATOR 8 (GLOVE) ×2
GOWN STRL REUS W/ TWL LRG LVL3 (GOWN DISPOSABLE) ×1 IMPLANT
GOWN STRL REUS W/ TWL XL LVL3 (GOWN DISPOSABLE) ×1 IMPLANT
GOWN STRL REUS W/TWL LRG LVL3 (GOWN DISPOSABLE) ×2
GOWN STRL REUS W/TWL XL LVL3 (GOWN DISPOSABLE) ×2
KIT BASIN OR (CUSTOM PROCEDURE TRAY) ×3 IMPLANT
KIT TURNOVER KIT B (KITS) ×3 IMPLANT
NEEDLE 18GX1X1/2 (RX/OR ONLY) (NEEDLE) ×3 IMPLANT
NEEDLE HYPO 25GX1X1/2 BEV (NEEDLE) ×3 IMPLANT
NS IRRIG 1000ML POUR BTL (IV SOLUTION) ×3 IMPLANT
PACK SURGICAL SETUP 50X90 (CUSTOM PROCEDURE TRAY) ×3 IMPLANT
PAD ARMBOARD 7.5X6 YLW CONV (MISCELLANEOUS) ×6 IMPLANT
SOAP 2 % CHG 4 OZ (WOUND CARE) ×3 IMPLANT
SUT ETHILON 3 0 PS 1 (SUTURE) ×3 IMPLANT
SUT MNCRL AB 4-0 PS2 18 (SUTURE) ×3 IMPLANT
SYR 10ML LL (SYRINGE) IMPLANT
SYR 20CC LL (SYRINGE) ×6 IMPLANT
SYR 30ML LL (SYRINGE) IMPLANT
SYR 5ML LL (SYRINGE) ×3 IMPLANT
SYR CONTROL 10ML LL (SYRINGE) ×3 IMPLANT
TOWEL GREEN STERILE (TOWEL DISPOSABLE) ×3 IMPLANT
WATER STERILE IRR 1000ML POUR (IV SOLUTION) IMPLANT

## 2018-10-25 NOTE — Anesthesia Postprocedure Evaluation (Signed)
Anesthesia Post Note  Patient: [name]  Procedure(s) Performed: EXCHANGE OF A DIALYSIS CATHETER TO RIGHT INTERNAL JUGULAR (Right )     Patient location during evaluation: PACU Anesthesia Type: MAC Level of consciousness: awake and alert Pain management: pain level controlled Vital Signs Assessment: post-procedure vital signs reviewed and stable Respiratory status: spontaneous breathing, nonlabored ventilation, respiratory function stable and patient connected to nasal cannula oxygen Cardiovascular status: stable and blood pressure returned to baseline Postop Assessment: no apparent nausea or vomiting Anesthetic complications: no    Last Vitals:  Vitals:   10/25/18 0915 10/25/18 0942  BP: 132/70 127/72  Pulse: 67 67  Resp: 13 18  Temp: 36.4 C 36.8 C  SpO2: 93% 96%    Last Pain:  Vitals:   10/25/18 0942  TempSrc: Oral  PainSc:                  Tiajuana Amass

## 2018-10-25 NOTE — Transfer of Care (Signed)
Immediate Anesthesia Transfer of Care Note  Patient: [name]  Procedure(s) Performed: EXCHANGE OF A DIALYSIS CATHETER TO RIGHT INTERNAL JUGULAR (Right )  Patient Location: PACU  Anesthesia Type:MAC  Level of Consciousness: awake, alert  and patient cooperative  Airway & Oxygen Therapy: Patient Spontanous Breathing  Post-op Assessment: Report given to RN and Post -op Vital signs reviewed and stable  Post vital signs: Reviewed and stable  Last Vitals:  Vitals Value Taken Time  BP 111/68 10/25/2018  8:33 AM  Temp    Pulse 71 10/25/2018  8:34 AM  Resp 13 10/25/2018  8:34 AM  SpO2 95 % 10/25/2018  8:34 AM  Vitals shown include unvalidated device data.  Last Pain:  Vitals:   10/25/18 0430  TempSrc: Oral  PainSc:          Complications: No apparent anesthesia complications

## 2018-10-25 NOTE — Op Note (Addendum)
Date: October 25, 2018  Preoperative diagnosis: Malfunctioning right internal jugular vein tunneled dialysis catheter  Postoperative Diagnosis: Same  Procedure: Exchange of right internal jugular vein 23 cm tunneled palindrome dialysis catheter  Surgeon: Dr. Marty Heck, MD  Indications: Patient is a 57 year old male well-known to the vascular surgery service who has end-stage renal disease and currently dialyzing via right IJ tunneled catheter while his left brachiocephalic fistula revision matures.  Vascular surgery was consulted yesterday due to low flow volumes of his existing catheter and we were asked to exchange his existing catheter.  Findings: On his previous chest x-ray it appeared that the right IJ tunnel catheter was positioned at the cavoatrial junction most likely in the superior vena cava.  We exchanged his right IJ catheter for a new 23 cm palindrome catheter that was placed further into the right atrium.  Anesthesia: MAC  Details: The patient was taken to the operating room after informed consent was obtained.  He was placed on operative table in supine position.  His right neck was then prepped and draped in the standard sterile fashion including his entire existing catheter and chest wall was prepped in the field.  A prep timeout was performed to identify patient, procedure, and site.  We initially used 1% lidocaine without epinephrine and obtained a field block where his existing catheter was located.  Initially used a 15 blade scalpel and cut down on his right IJ stick site where the catheter entered the internal jugular vein.  I dissected out the catheter here and then clamped it with a hemostat and then transected the catheter.  I then passed a J-wire through the existing catheter into the right atrium under fluoroscopy.  I then turned my attention to the existing catheter in his chest wall that had been transected and I dissected out the cuff under the skin with a  pair of blunt hemostats and then pulled the remainder of the catheter out of the chest wall.  I brought a new 23 cm palindrome catheter on the field and over my wire that had been placed through the existing catheter into the right atrium I placed a large dilator peel-away sheath under fluoroscopy into the right atrium.  I then selected a new site on the chest wall where I made a separate stab incision and then tunneled from the chest wall to the stick site in the right IJ with a blunt tunneler.  I then pulled a fresh 23 cm palindrome catheter out through this new stick site and ensured the cuff was secured under the skin.  The catheter was clamped where it existed the chest wall.  Then under fluoroscopy I placed the new 23 cm palindrome catheter into the right atrium and then peeled away the peel-away sheath.  I did confirm that my catheter was in the right atrium and that the catheter was not kinked proximally.  I then connected the adapter to the end of the catheter with two ports.  The catheter flushed and aspirated easily.  The catheter was then loaded with heparinized saline according instructions on the catheter.  The incision over the right IJ was then closed with a subcuticular 4-0 Monocryl.  I then placed a nylon pursestring around the catheter exit site on the chest wall and then secured the catheter with a separate nylon.  Dermabond was applied.  Patient was taken to PACU in stable condition.  Complications: None  Condition: Stable  Marty Heck, MD [facility] [place] Office: [phone] Pager: [place]

## 2018-10-25 NOTE — Anesthesia Procedure Notes (Signed)
Procedure Name: MAC Date/Time: 10/25/2018 7:39 AM Performed by: White, Amedeo Plenty, CRNA Pre-anesthesia Checklist: Patient identified, Emergency Drugs available, Suction available and Patient being monitored Patient Re-evaluated:Patient Re-evaluated prior to induction Oxygen Delivery Method: Simple face mask

## 2018-10-25 NOTE — Progress Notes (Signed)
[facility] [place]  Subjective  - No acute events overnight.   Objective (!) 187/75 78 99.2 F (37.3 C) (Oral) 19 100%  Intake/Output Summary (Last 24 hours) at 10/25/2018 0727 Last data filed at 10/24/2018 1649 Gross per 24 hour  Intake 240 ml  Output 2000 ml  Net -1760 ml    PE General: NAD, resting RIJ tunneled catheter Left arm brachiocephalic AVF with good thrill  Laboratory Lab Results: Recent Labs    10/24/18 0634 10/25/18 0618  WBC 12.3* 12.0*  HGB 7.1* 7.0*  HCT 25.3* 25.2*  PLT 226 160   BMET Recent Labs    10/24/18 1334 10/25/18 0618  NA 136 134*  K 3.4* 3.7  CL 102 100  CO2 25 25  GLUCOSE 182* 189*  BUN 43* 34*  CREATININE 4.55* 4.75*  CALCIUM 7.9* 8.3*    COAG Lab Results  Component Value Date   INR 1.10 10/25/2018   INR 1.17 03/31/2017   INR 1.09 12/18/2013   No results found for: PTT  Assessment/Planning: Plan for RIJ tunneled catheter exchange.  Low flow volumes and needs dialysis for OR tomorrow with plans for BKA with orthopedic surgery.  Consent obtained after risks and benefits discussed.    Marty Heck 10/25/2018 7:27 AM --  Marty Heck

## 2018-10-25 NOTE — Progress Notes (Signed)
[facility] Progress Note   Subjective:  S/p TDC exchange this am.  No complaints this am. Surgery planned for tomorrow.   Objective Vitals:   10/25/18 0845 10/25/18 0900 10/25/18 0915 10/25/18 0942  BP: 116/68 139/71 132/70 127/72  Pulse: 69  67 67  Resp: 11  13 18   Temp:   97.6 F (36.4 C) 98.2 F (36.8 C)  TempSrc:    Oral  SpO2: 98%  93% 96%  Weight:      Height:       Physical Exam General: Alert WNWD NAD  Heart: RRR Lungs: CTAB  Abdomen: soft NT/ND Extremities: s/p L BKA; R  Dialysis Access: LUE AVF +bruit; New R IJ TDC dsg clean, intact  Dialysis Orders:  Center: [facility] TTS EDW 91.5  HD Bath 2k,2ca  Time 4 Heparin 2600. Access R IJ Percath / LUA AVF (s/p revision 09/13/18) Calcitriol 0.5 mcg IV/HD   Mircera 225  Next due 10/24/18  Other  pth 1011 (11107/19) hgb 7.8 10/17/18   Assessment/Plan: 1. Right foot infection with Osteomyelitis - Iv antibx Vanco/ Ortho following with plans for R BKA 11/23 per  Dr. [name] 2. ESRD - HD TTS s/p [facility] exchange 11/22. HD today off schedule in anticipation of surgery tomorrow. Using 4K bath.  3. HTN/volume  - BP stable. On Coreg 12.5 bid. No volume on CXR. UF to EDW as tolerated.  4. Anemia- Hgb 7.0. Got Aranesp 200 11/21. Plan transfusion 2u prbcs with HD today.  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 No binders on med list.  6. Nutrition - Low albumin. Add prostat 7. PAD - Status post revascularization of right lower extremity in September 16, 2018.-on home Plavix, Lipitoras tolerated 8. DM - insulin per primary   Lynnda Child PA-C [place] Pager [phone] 10/25/2018,10:20 AM  LOS: 3 days   Additional Objective Labs: Basic Metabolic Panel: Recent Labs  Lab 10/23/18 0640 10/24/18 1334 10/25/18 0618  NA 137 136 134*  K 3.2* 3.4* 3.7  CL 99 102 100  CO2 26 25 25   GLUCOSE 278* 182* 189*  BUN 36* 43* 34*  CREATININE 4.58* 4.55* 4.75*  CALCIUM 7.8* 7.9* 8.3*  PHOS  --  3.8  --     CBC: Recent Labs  Lab 10/22/18 1852 10/23/18 0640 10/24/18 0634 10/25/18 0618  WBC 16.2* 11.4* 12.3* 12.0*  NEUTROABS 13.2*  --   --   --   HGB 8.2* 7.3* 7.1* 7.0*  HCT 28.4* 25.6* 25.3* 25.2*  MCV 77.2* 76.9* 77.6* 76.8*  PLT 167 163 226 160   Blood Culture    Component Value Date/Time   SDES BLOOD LEFT HAND 09/10/2018 0034   SPECREQUEST  09/10/2018 0034    BOTTLES DRAWN AEROBIC ONLY Blood Culture adequate volume   CULT  09/10/2018 0034    NO GROWTH 5 DAYS Performed at [facility], [place] [address] [place], [place] [address]    REPTSTATUS 09/15/2018 FINAL 09/10/2018 0034    Cardiac Enzymes: No results for input(s): CKTOTAL, CKMB, CKMBINDEX, TROPONINI in the last 168 hours. CBG: Recent Labs  Lab 10/24/18 1746 10/24/18 2057 10/25/18 0627 10/25/18 0834 10/25/18 0940  GLUCAP 147* 234* 172* 183* 158*   Iron Studies: No results for input(s): IRON, TIBC, TRANSFERRIN, FERRITIN in the last 72 hours. Lab Results  Component Value Date   INR 1.10 10/25/2018   INR 1.17 03/31/2017   INR 1.09 12/18/2013   Medications: . vancomycin 1,000 mg (10/24/18 1611)   .  ammonium lactate   Topical BID  . [START ON 10/27/2018] aspirin EC  81 mg Oral Daily  . atorvastatin  10 mg Oral QHS  . calcitRIOL  0.5 mcg Oral Q T,Th,Sa-HD  . carvedilol  12.5 mg Oral BID WC  . darbepoetin (ARANESP) injection - DIALYSIS  200 mcg Intravenous Q Thu-HD  . gabapentin  300 mg Oral QHS  . heparin  5,000 Units Subcutaneous Q8H  . insulin aspart  0-9 Units Subcutaneous TID WC  . insulin glargine  15 Units Subcutaneous BID  . multivitamin  1 tablet Oral QHS  . pantoprazole  40 mg Oral Daily

## 2018-10-26 ENCOUNTER — Inpatient Hospital Stay (HOSPITAL_COMMUNITY): Payer: Medicare Other | Admitting: Certified Registered Nurse Anesthetist

## 2018-10-26 ENCOUNTER — Encounter (HOSPITAL_COMMUNITY): Payer: Self-pay | Admitting: Vascular Surgery

## 2018-10-26 ENCOUNTER — Encounter (HOSPITAL_COMMUNITY)
Admission: EM | Disposition: A | Discharge status: Rehabilitation Facility | Payer: Self-pay | Source: Home / Self Care | Attending: Internal Medicine

## 2018-10-26 HISTORY — PX: AMPUTATION: SHX166

## 2018-10-26 LAB — POCT I-STAT 4, (NA,K, GLUC, HGB,HCT)
Glucose, Bld: 185 mg/dL — ABNORMAL HIGH (ref 70–99)
HCT: 27 % — ABNORMAL LOW (ref 39.0–52.0)
Hemoglobin: 9.2 g/dL — ABNORMAL LOW (ref 13.0–17.0)
POTASSIUM: 3.8 mmol/L (ref 3.5–5.1)
Sodium: 138 mmol/L (ref 135–145)

## 2018-10-26 LAB — CBC
HCT: 30.2 % — ABNORMAL LOW (ref 39.0–52.0)
Hemoglobin: 8.7 g/dL — ABNORMAL LOW (ref 13.0–17.0)
MCH: 22.7 pg — ABNORMAL LOW (ref 26.0–34.0)
MCHC: 28.8 g/dL — ABNORMAL LOW (ref 30.0–36.0)
MCV: 78.9 fL — AB (ref 80.0–100.0)
NRBC: 0 % (ref 0.0–0.2)
PLATELETS: 134 10*3/uL — AB (ref 150–400)
RBC: 3.83 MIL/uL — AB (ref 4.22–5.81)
RDW: 16.2 % — AB (ref 11.5–15.5)
WBC: 12.9 10*3/uL — ABNORMAL HIGH (ref 4.0–10.5)

## 2018-10-26 LAB — BPAM RBC
BLOOD PRODUCT EXPIRATION DATE: 201912202359
Blood Product Expiration Date: 201912202359
ISSUE DATE / TIME: 201911221445
ISSUE DATE / TIME: 201911221445
UNIT TYPE AND RH: 5100
Unit Type and Rh: 5100

## 2018-10-26 LAB — GLUCOSE, CAPILLARY
GLUCOSE-CAPILLARY: 172 mg/dL — AB (ref 70–99)
GLUCOSE-CAPILLARY: 170 mg/dL — AB (ref 70–99)
GLUCOSE-CAPILLARY: 221 mg/dL — AB (ref 70–99)
Glucose-Capillary: 152 mg/dL — ABNORMAL HIGH (ref 70–99)

## 2018-10-26 LAB — TYPE AND SCREEN
ABO/RH(D): O POS
Antibody Screen: NEGATIVE
Unit division: 0
Unit division: 0

## 2018-10-26 SURGERY — AMPUTATION BELOW KNEE
Anesthesia: Monitor Anesthesia Care | Laterality: Right

## 2018-10-26 MED ORDER — DOCUSATE SODIUM 100 MG PO CAPS
100.0000 mg | ORAL_CAPSULE | Freq: Two times a day (BID) | ORAL | Status: DC
  Administered 2018-10-26 – 2018-11-01 (×12): 100 mg via ORAL
  Filled 2018-10-26 (×13): qty 1

## 2018-10-26 MED ORDER — PROPOFOL 500 MG/50ML IV EMUL
INTRAVENOUS | Status: DC | PRN
  Administered 2018-10-26: 100 ug/kg/min via INTRAVENOUS

## 2018-10-26 MED ORDER — FENTANYL CITRATE (PF) 100 MCG/2ML IJ SOLN
INTRAMUSCULAR | Status: DC | PRN
  Administered 2018-10-26: 50 ug via INTRAVENOUS

## 2018-10-26 MED ORDER — DIPHENHYDRAMINE HCL 12.5 MG/5ML PO ELIX: 12.5000 mg | ORAL_SOLUTION | Freq: Four times a day (QID) | ORAL | Status: DC | PRN

## 2018-10-26 MED ORDER — SODIUM CHLORIDE 0.9% FLUSH: 9.0000 mL | INTRAVENOUS | Status: DC | PRN

## 2018-10-26 MED ORDER — CEFAZOLIN SODIUM 1 G IJ SOLR
INTRAMUSCULAR | Status: AC
  Filled 2018-10-26: qty 20

## 2018-10-26 MED ORDER — LIDOCAINE 2% (20 MG/ML) 5 ML SYRINGE
INTRAMUSCULAR | Status: DC | PRN
  Administered 2018-10-26: 25 mg via INTRAVENOUS

## 2018-10-26 MED ORDER — HYDROMORPHONE HCL 1 MG/ML IJ SOLN
0.5000 mg | Freq: Once | INTRAMUSCULAR | Status: AC
  Administered 2018-10-26: 0.5 mg via INTRAVENOUS
  Filled 2018-10-26: qty 1

## 2018-10-26 MED ORDER — ONDANSETRON HCL 4 MG/2ML IJ SOLN: 4.0000 mg | Freq: Four times a day (QID) | INTRAMUSCULAR | Status: DC | PRN

## 2018-10-26 MED ORDER — NALOXONE HCL 0.4 MG/ML IJ SOLN: 0.4000 mg | INTRAMUSCULAR | Status: DC | PRN

## 2018-10-26 MED ORDER — MORPHINE SULFATE (PF) 2 MG/ML IV SOLN
2.0000 mg | INTRAVENOUS | Status: DC | PRN
  Administered 2018-10-26: 2 mg via INTRAVENOUS
  Filled 2018-10-26: qty 1

## 2018-10-26 MED ORDER — DIPHENHYDRAMINE HCL 50 MG/ML IJ SOLN: 12.5000 mg | Freq: Four times a day (QID) | INTRAMUSCULAR | Status: DC | PRN

## 2018-10-26 MED ORDER — PROPOFOL 10 MG/ML IV BOLUS
INTRAVENOUS | Status: AC
  Filled 2018-10-26: qty 20

## 2018-10-26 MED ORDER — ONDANSETRON HCL 4 MG PO TABS: 4.0000 mg | ORAL_TABLET | Freq: Four times a day (QID) | ORAL | Status: DC | PRN

## 2018-10-26 MED ORDER — SODIUM CHLORIDE 0.9 % IV SOLN
INTRAVENOUS | Status: DC
  Administered 2018-10-26: 10:00:00 via INTRAVENOUS

## 2018-10-26 MED ORDER — VANCOMYCIN HCL 1000 MG IV SOLR
INTRAVENOUS | Status: DC | PRN
  Administered 2018-10-26: 1000 mg

## 2018-10-26 MED ORDER — FENTANYL CITRATE (PF) 250 MCG/5ML IJ SOLN
INTRAMUSCULAR | Status: AC
  Filled 2018-10-26: qty 5

## 2018-10-26 MED ORDER — ROPIVACAINE HCL 5 MG/ML IJ SOLN
INTRAMUSCULAR | Status: DC | PRN
  Administered 2018-10-26: 10 mL via PERINEURAL
  Administered 2018-10-26: 30 mL via PERINEURAL

## 2018-10-26 MED ORDER — HYDROMORPHONE 1 MG/ML IV SOLN
INTRAVENOUS | Status: DC
  Administered 2018-10-27: 01:00:00 via INTRAVENOUS
  Filled 2018-10-26 (×2): qty 25

## 2018-10-26 MED ORDER — LIDOCAINE 2% (20 MG/ML) 5 ML SYRINGE
INTRAMUSCULAR | Status: AC
  Filled 2018-10-26: qty 5

## 2018-10-26 MED ORDER — VANCOMYCIN HCL 1000 MG IV SOLR
INTRAVENOUS | Status: AC
  Filled 2018-10-26: qty 1000

## 2018-10-26 MED ORDER — PROPOFOL 1000 MG/100ML IV EMUL
INTRAVENOUS | Status: AC
  Filled 2018-10-26: qty 100

## 2018-10-26 MED ORDER — HYDROMORPHONE HCL 1 MG/ML IJ SOLN
2.0000 mg | Freq: Once | INTRAMUSCULAR | Status: AC
  Administered 2018-10-27: 2 mg via INTRAVENOUS
  Filled 2018-10-26: qty 2

## 2018-10-26 MED ORDER — FENTANYL CITRATE (PF) 100 MCG/2ML IJ SOLN
INTRAMUSCULAR | Status: AC
  Filled 2018-10-26: qty 2

## 2018-10-26 MED ORDER — 0.9 % SODIUM CHLORIDE (POUR BTL) OPTIME
TOPICAL | Status: DC | PRN
  Administered 2018-10-26: 1000 mL

## 2018-10-26 MED ORDER — CEFAZOLIN SODIUM-DEXTROSE 2-3 GM-%(50ML) IV SOLR
INTRAVENOUS | Status: DC | PRN
  Administered 2018-10-26: 2 g via INTRAVENOUS

## 2018-10-26 MED ORDER — MIDAZOLAM HCL 2 MG/2ML IJ SOLN
INTRAMUSCULAR | Status: AC
  Filled 2018-10-26: qty 2

## 2018-10-26 SURGICAL SUPPLY — 52 items
BANDAGE ACE 6X5 VEL STRL LF (GAUZE/BANDAGES/DRESSINGS) IMPLANT
BANDAGE ESMARK 6X9 LF (GAUZE/BANDAGES/DRESSINGS) IMPLANT
BLADE SAW RECIP 87.9 MT (BLADE) ×3 IMPLANT
BNDG COHESIVE 6X5 TAN STRL LF (GAUZE/BANDAGES/DRESSINGS) ×3 IMPLANT
BNDG ELASTIC 6X15 VLCR STRL LF (GAUZE/BANDAGES/DRESSINGS) ×3 IMPLANT
BNDG ESMARK 6X9 LF (GAUZE/BANDAGES/DRESSINGS)
CHLORAPREP W/TINT 26ML (MISCELLANEOUS) ×3 IMPLANT
COVER SURGICAL LIGHT HANDLE (MISCELLANEOUS) ×3 IMPLANT
COVER WAND RF STERILE (DRAPES) ×3 IMPLANT
CUFF TOURNIQUET SINGLE 34IN LL (TOURNIQUET CUFF) ×3 IMPLANT
CUFF TOURNIQUET SINGLE 44IN (TOURNIQUET CUFF) IMPLANT
DRAPE INCISE IOBAN 66X45 STRL (DRAPES) ×3 IMPLANT
DRAPE U-SHAPE 47X51 STRL (DRAPES) ×6 IMPLANT
DRSG MEPITEL 4X7.2 (GAUZE/BANDAGES/DRESSINGS) ×3 IMPLANT
DRSG PAD ABDOMINAL 8X10 ST (GAUZE/BANDAGES/DRESSINGS) ×9 IMPLANT
ELECT CAUTERY BLADE 6.4 (BLADE) IMPLANT
ELECT REM PT RETURN 9FT ADLT (ELECTROSURGICAL) ×3
ELECTRODE REM PT RTRN 9FT ADLT (ELECTROSURGICAL) ×1 IMPLANT
EVACUATOR 1/8 PVC DRAIN (DRAIN) IMPLANT
GAUZE SPONGE 4X4 12PLY STRL (GAUZE/BANDAGES/DRESSINGS) ×3 IMPLANT
GLOVE BIO SURGEON STRL SZ8 (GLOVE) ×3 IMPLANT
GLOVE BIOGEL PI IND STRL 8 (GLOVE) ×2 IMPLANT
GLOVE BIOGEL PI INDICATOR 8 (GLOVE) ×4
GLOVE ECLIPSE 8.0 STRL XLNG CF (GLOVE) ×6 IMPLANT
GOWN STRL REUS W/ TWL LRG LVL3 (GOWN DISPOSABLE) ×1 IMPLANT
GOWN STRL REUS W/ TWL XL LVL3 (GOWN DISPOSABLE) ×1 IMPLANT
GOWN STRL REUS W/TWL LRG LVL3 (GOWN DISPOSABLE) ×2
GOWN STRL REUS W/TWL XL LVL3 (GOWN DISPOSABLE) ×2
IMMOBILIZER KNEE 22 UNIV (SOFTGOODS) ×3 IMPLANT
KIT BASIN OR (CUSTOM PROCEDURE TRAY) ×3 IMPLANT
KIT TURNOVER KIT B (KITS) ×3 IMPLANT
MANIFOLD NEPTUNE II (INSTRUMENTS) ×3 IMPLANT
NS IRRIG 1000ML POUR BTL (IV SOLUTION) ×3 IMPLANT
PACK GENERAL/GYN (CUSTOM PROCEDURE TRAY) ×3 IMPLANT
PAD ABD 8X10 STRL (GAUZE/BANDAGES/DRESSINGS) ×3 IMPLANT
PAD ARMBOARD 7.5X6 YLW CONV (MISCELLANEOUS) ×6 IMPLANT
PAD CAST 4YDX4 CTTN HI CHSV (CAST SUPPLIES) ×1 IMPLANT
PADDING CAST COTTON 4X4 STRL (CAST SUPPLIES) ×2
PADDING CAST COTTON 6X4 STRL (CAST SUPPLIES) ×3 IMPLANT
SPONGE LAP 18X18 X RAY DECT (DISPOSABLE) IMPLANT
STAPLER VISISTAT 35W (STAPLE) IMPLANT
STOCKINETTE IMPERVIOUS LG (DRAPES) IMPLANT
SUT ETHILON 2 0 PSLX (SUTURE) ×6 IMPLANT
SUT MNCRL AB 3-0 PS2 18 (SUTURE) ×6 IMPLANT
SUT PDS AB 0 CT 36 (SUTURE) ×6 IMPLANT
SUT SILK 2 0 (SUTURE) ×2
SUT SILK 2-0 18XBRD TIE 12 (SUTURE) ×1 IMPLANT
SWAB COLLECTION DEVICE MRSA (MISCELLANEOUS) IMPLANT
SWAB CULTURE ESWAB REG 1ML (MISCELLANEOUS) IMPLANT
TOWEL OR 17X24 6PK STRL BLUE (TOWEL DISPOSABLE) ×3 IMPLANT
TOWEL OR 17X26 10 PK STRL BLUE (TOWEL DISPOSABLE) ×3 IMPLANT
WATER STERILE IRR 1000ML POUR (IV SOLUTION) ×3 IMPLANT

## 2018-10-26 NOTE — Op Note (Signed)
10/26/2018  12:00 PM  PATIENT:  [name]  57 y.o. male  PRE-OPERATIVE DIAGNOSIS:  Diabetic right foot ulcer and oseomyelitis  POST-OPERATIVE DIAGNOSIS:  Diabetic right foot ulcer and oseomyelitis  Procedure(s): AMPUTATION BELOW KNEE  SURGEON:  [name], MD  ASSISTANT: none  ANESTHESIA: regional, MAC  EBL:  minimal   TOURNIQUET:   Total Tourniquet Time Documented: Thigh (Right) - 48 minutes Total: Thigh (Right) - 48 minutes  COMPLICATIONS:  None apparent  DISPOSITION:  Extubated, awake and stable to recovery.  INDICATION FOR PROCEDURE: Patient is a 57 year old male with a past medical history significant for diabetes complicated by peripheral neuropathy and previous right foot transmetatarsal amputation.  The patient has developed a diabetic ulcer at the plantar lateral aspect of his remaining right foot.  An MRI reveals underlying osteomyelitis involving the lateral metatarsals.  There is a large ulcer at the plantar lateral foot.  There is inadequate soft tissue to allow for limb salvage and treatment of the osteomyelitis.  He presents now for below-knee amputation on the right.  The risks and benefits of the alternative treatment options have been discussed in detail.  The patient wishes to proceed with surgery and specifically understands risks of bleeding, infection, nerve damage, blood clots, need for additional surgery, revision amputation and death.  PROCEDURE IN DETAIL:  After pre operative consent was obtained, and the correct operative site was identified, the patient was brought to the operating room and placed supine on the OR table.  Anesthesia was administered.  Pre-operative antibiotics were administered.  A surgical timeout was taken.  The right lower remedy was prepped and draped in standard sterile fashion with a tourniquet around the thigh.  The extremity was elevated and the tourniquet was inflated to 250 mmHg.  A posterior flap incision was marked on  the skin 15 cm distal from the medial joint line.  The incision was made and dissection was carried down through the subcutaneous tissues to the anterior tibia.  The anterior compartment musculature was divided.  The surrounding soft tissues were protected and the tibia was cut with a was obtaining soft 1 center meter proximal from the level of the skin incision.  The fibula was then exposed and cut approximately 1 cm proximal from the tibial cut.  The posterior soft tissues were then transected beveling the cut distally.  The leg was passed off the field.  The cut surface of bone was smoothed with a rasp.  The wound was irrigated copiously.  The named vascular structures were suture ligated with 2-0 silk ties.  The named nerves were sharply transected while held on gentle traction.  Vancomycin powder was sprinkled in the wound.  The gastrocnemius tendon was then repaired to the anterior periosteum with number caring sutures of 0 PDS.  Deep subcutaneous tissues were approximated with figure-of-eight sutures of 0 PDS.  Subcutaneous tissues were then approximated with inverted simple sutures of 3-0 Monocryl.  2-0 nylon horizontal mattress sutures were used to close the skin incision.  Sterile dressings were applied followed by a compression wrap and a knee immobilizer.  The tourniquet was released after application of the dressings.  The patient was awakened from anesthesia and transported to the recovery room in stable condition.  FOLLOW UP PLAN: Nonweightbearing on the right lower extremity.  Resume DVT prophylaxis tomorrow.

## 2018-10-26 NOTE — Interval H&P Note (Signed)
History and Physical Interval Note:  10/26/2018 10:25 AM  [name]  has presented today for surgery, with the diagnosis of right diabetic foot ulcer and oseomyelitis  The various methods of treatment have been discussed with the patient and family. After consideration of risks, benefits and other options for treatment, the patient has consented to  Procedure(s): AMPUTATION BELOW KNEE (Right) as a surgical intervention .  The patient's history has been reviewed, patient examined, no change in status, stable for surgery.  I have reviewed the patient's chart and labs.  Questions were answered to the patient's satisfaction.    The risks and benefits of the alternative treatment options have been discussed in detail.  The patient wishes to proceed with surgery and specifically understands risks of bleeding, infection, nerve damage, blood clots, need for additional surgery, amputation and death.   [name]

## 2018-10-26 NOTE — Progress Notes (Addendum)
[facility] Progress Note   Subjective:  Seen in room. S/p R BKA this am. Pain controlled.   Objective Vitals:   10/26/18 1215 10/26/18 1230 10/26/18 1245 10/26/18 1300  BP: 124/73 109/70 132/82 127/89  Pulse: 65 67 65 68  Resp: 14 16 16 18   Temp:    97.7 F (36.5 C)  TempSrc:      SpO2: 98% 97% 97% 97%  Weight:      Height:       Physical Exam General: Alert WNWD NAD  Heart: RRR Lungs: CTAB  Abdomen: soft NT/ND Extremities: s/p L BKA; Right BKA bandaged in stump protector  Dialysis Access: LUE AVF +bruit; New R IJ [facility]   Dialysis Orders:  Center: [facility] TThS EDW 91.5  HD Bath 2k,2ca  Time 4 Heparin 2600. Access R IJ Percath / LUA AVF (s/p revision 09/13/18) Calcitriol 0.5 mcg IV/HD   Mircera 225  Next due 10/24/18  Other  pth 1011 (11107/19) hgb 7.8 10/17/18   Assessment/Plan: 1. Right foot Osteomyelitis - On IV Vanc. S/p R BKA 11/23 per  Dr. [name] 2. ESRD - HD TTS s/p [facility] exchange 11/22. Next HD Monday 11/25 d/t holiday schedule next week.  3. HTN/volume  - BP stable. On Coreg 12.5 bid. No volume on CXR. Not to EDW yet. Anticipate lower EDW s/p BKA UF as tolerated.  4. Anemia- Hgb 8.7 s/p 2 units prbcs 11/22. Aranesp 200 q Thursday 5. MBD- Ca/Phos ok. On Calcitriol. No binders on med list.  6. Nutrition - Low albumin. Add prostat 7. PAD - Status post revascularization of RLE 09/16/2018 8. DM - insulin per primary   Lynnda Child PA-C [place] Pager [phone] 10/26/2018,2:44 PM  LOS: 4 days   Pt seen, examined and agree w A/P as above.  [name] MD [facility] pager [phone]   10/26/2018, 3:40 PM    Additional Objective Labs: Basic Metabolic Panel: Recent Labs  Lab 10/23/18 0640 10/24/18 1334 10/25/18 0618 10/26/18 0926  NA 137 136 134* 138  K 3.2* 3.4* 3.7 3.8  CL 99 102 100  --   CO2 26 25 25   --   GLUCOSE 278* 182* 189* 185*  BUN 36* 43* 34*  --   CREATININE 4.58* 4.55* 4.75*   --   CALCIUM 7.8* 7.9* 8.3*  --   PHOS  --  3.8  --   --    CBC: Recent Labs  Lab 10/22/18 1852 10/23/18 0640 10/24/18 0634 10/25/18 0618 10/26/18 0639 10/26/18 0926  WBC 16.2* 11.4* 12.3* 12.0* 12.9*  --   NEUTROABS 13.2*  --   --   --   --   --   HGB 8.2* 7.3* 7.1* 7.0* 8.7* 9.2*  HCT 28.4* 25.6* 25.3* 25.2* 30.2* 27.0*  MCV 77.2* 76.9* 77.6* 76.8* 78.9*  --   PLT 167 163 226 160 134*  --    Blood Culture    Component Value Date/Time   SDES BLOOD LEFT HAND 09/10/2018 0034   SPECREQUEST  09/10/2018 0034    BOTTLES DRAWN AEROBIC ONLY Blood Culture adequate volume   CULT  09/10/2018 0034    NO GROWTH 5 DAYS Performed at [facility], [place] [address] [place], [place] [address]    REPTSTATUS 09/15/2018 FINAL 09/10/2018 0034    Cardiac Enzymes: No results for input(s): CKTOTAL, CKMB, CKMBINDEX, TROPONINI in the last 168 hours. CBG: Recent Labs  Lab 10/25/18 1141 10/25/18 1753 10/25/18 2222 10/26/18  5456 10/26/18 1208  GLUCAP 218* 183* 191* 172* 170*   Iron Studies: No results for input(s): IRON, TIBC, TRANSFERRIN, FERRITIN in the last 72 hours. Lab Results  Component Value Date   INR 1.10 10/25/2018   INR 1.17 03/31/2017   INR 1.09 12/18/2013   Medications: . sodium chloride    . [START ON 10/29/2018] vancomycin     . sodium chloride   Intravenous Once  . ammonium lactate   Topical BID  . [START ON 10/27/2018] aspirin EC  81 mg Oral Daily  . atorvastatin  10 mg Oral QHS  . calcitRIOL  0.5 mcg Oral Q T,Th,Sa-HD  . carvedilol  12.5 mg Oral BID WC  . darbepoetin (ARANESP) injection - DIALYSIS  200 mcg Intravenous Q Thu-HD  . docusate sodium  100 mg Oral BID  . feeding supplement (PRO-STAT SUGAR FREE 64)  30 mL Oral BID  . fentaNYL      . gabapentin  300 mg Oral QHS  . insulin aspart  0-9 Units Subcutaneous TID WC  . insulin glargine  15 Units Subcutaneous BID  . midazolam      . multivitamin  1 tablet Oral QHS  . pantoprazole  40 mg Oral Daily

## 2018-10-26 NOTE — Progress Notes (Signed)
Pt has had prn Vicodin at shift change, prn morphine, and one time dose of Dilaudid. Pt is still in excruciating pain from amputation site. Pt states, " I am not going to make it. RN messaged MD to come assess pt.   Eleanora Neighbor, RN

## 2018-10-26 NOTE — Progress Notes (Signed)
PROGRESS NOTE    ANTERO [name]  [name]:[id number] DOB: [birth year] DOA: 10/22/2018 [name]: [name], [name] Not In    Brief Narrative:  57 y.o. male with medical history significant of insulin-dependent diabetes mellitus, left BKA, right metatarsal foot amputation, peripheral neuropathy, hypertension, hyperlipidemia, tobacco abuse ESRD started on dialysis 3 weeks ago, recent history of lower extremity osteomyelitis and recent amputation presenting to the hospital for evaluation of hypotension at dialysis today.  Patient was sent to the ED via EMS from dialysis.  Per ED report, patient completed entire treatment.  When they stood him up to weigh him at dialysis he had a syncopal/near syncopal episode and became hypotensive.  He was given 650 cc bolus prior to arrival.  Patient reports having pain in his right foot which started a few days after his recent hospital discharge.  States some days he is unable to bear weight on that leg.  States he started noticing a few days ago that the bottom of his right foot has been peeling.  Denies having any fevers, chills, nausea, or vomiting.  States his appetite has been reduced since his prior hospitalization.  He did finish a full course of doxycycline after his recent discharge.  Patient was recently admitted from September 09, 2018 to September 20 2018.  He was transitioned to hemodialysis during this hospitalization.  MRI of his right stump revealed early osteomyelitis.  MRSA screening positive.  He was treated with 10 days of IV antibiotics including vancomycin.  Vascular surgery was consulted and patient underwent revascularization of his right lower extremity in September 16, 2018.  Hospital course was complicated by intermittent severe pain in his right stump.  Infectious disease was consulted and recommended doxycycline for 5 days.  ED Course: Hemodynamically stable.  White count 16.2; was 16.8 a month ago and has previously been elevated as well.  Lactic acid  normal.  Chest x-ray showing no acute cardiopulmonary process.  X-ray of right foot showing evidence of soft tissue air in the stump region as well as lateral to the fifth metatarsal remnant; no bony destruction evident.  Patient received 500 cc normal saline bolus, Norco, and vancomycin in the ED.  Assessment & Plan:   Principal Problem:   Right foot pain Active Problems:   HTN (hypertension)   Type 2 diabetes mellitus (HCC)   HLD (hyperlipidemia)   PVD (peripheral vascular disease) (HCC)   Hypokalemia   ESRD (end stage renal disease) (HCC)   Chronic anemia   Ambulatory dysfunction   GERD (gastroesophageal reflux disease)  Right foot (stump) pain, suspect 2/2 infection/ osteomyelitis -Recently admitted for right lower extremity cellulitis with early changes of osteomyelitis on MRI.  MRSA screening was positive.  He was treated with 10 days of IV antibiotics including vancomycin and treated with a 5-day course of doxycycline. He underwent revascularization of his right lower extremity in September 16, 2018.   -Currently afebrile and hemodynamically stable.  White count 16.2; was 16.8 a month ago and has previously been elevated as well.  Lactic acid normal.  X-ray of right foot showing evidence of soft tissue air in the stump region as well as lateral to the fifth metatarsal remnant; no bony destruction evident. -Continued on IV Vancomycin -R foot MRI reviewed with patient and Orthopedic Surgery. Concerns of osteomyelitis noted -Pain management: Norco 5-325 mg 1 tablet every 6 hours as needed -Wound care consulted -Pt is now s/p R BKA this afternoon, stable  Peripheral vascular disease Status post revascularization of right  lower extremity in September 16, 2018. -Continue home Plavix, Lipitor as tolerated -No evidence of acute bleeding  Hypokalemia -Continue to follow electrolytes and correct as needed  New end-stage renal disease on dialysis TTS -Nephrology consulted and  following -Continue HD as tolerated -Vascular Surgery following, placed RIJ tunneled cath 11/22  Anemia of chronic disease/iron deficiency anemia -Hemoglobin 8.2 at presentation. Remains hemodynamically stable -Continue to follow, transfuse as needed  Ambulatory dysfunction due to amputation/right foot pain -PT consulted -s/p amputation per above  Hypertension -Continue home Coreg as tolerated -BP stable at this time  Hyperlipidemia -Continue home Lipitor as tolerated, stable  Type 2 diabetes A1c 7.5 on September 10, 2018.  Most recent CBG 329. -Continue home Lantus 15 units twice daily -Sliding scale insulin sensitive -stable glucose at this time  GERD -Continue PPI as tolerated -Currently stable  DVT prophylaxis: Heparin subQ Code Status: Full Family Communication: Pt in room, family not at bedside Disposition Plan: Uncertain at this time  Consultants:   Nephrology  Orthopedic Surgery  Procedures:   R BKA 11/23  Antimicrobials: Anti-infectives (From admission, onward)   Start     Dose/Rate Route Frequency Ordered Stop   10/29/18 1200  vancomycin (VANCOCIN) IVPB 1000 mg/200 mL premix     1,000 mg 200 mL/hr over 60 Minutes Intravenous Every T-Th-Sa (Hemodialysis) 10/25/18 1300     10/26/18 1120  vancomycin (VANCOCIN) powder  Status:  Discontinued       As needed 10/26/18 1123 10/26/18 1203   10/25/18 1400  vancomycin (VANCOCIN) IVPB 1000 mg/200 mL premix     1,000 mg 200 mL/hr over 60 Minutes Intravenous Every M-W-F (Hemodialysis) 10/25/18 1259 10/25/18 1736   10/25/18 0700  ceFAZolin (ANCEF) IVPB 2g/100 mL premix     2 g 200 mL/hr over 30 Minutes Intravenous On call 10/24/18 1508 10/25/18 0744   10/24/18 1200  vancomycin (VANCOCIN) IVPB 1000 mg/200 mL premix  Status:  Discontinued     1,000 mg 200 mL/hr over 60 Minutes Intravenous Every T-Th-Sa (Hemodialysis) 10/22/18 2223 10/25/18 1300   10/22/18 2215  vancomycin (VANCOCIN) 2,000 mg in sodium  chloride 0.9 % 500 mL IVPB     2,000 mg 250 mL/hr over 120 Minutes Intravenous  Once 10/22/18 2209 10/23/18 0131      Subjective: No complaints  Objective: Vitals:   10/26/18 1215 10/26/18 1230 10/26/18 1245 10/26/18 1300  BP: 124/73 109/70 132/82 127/89  Pulse: 65 67 65 68  Resp: 14 16 16 18   Temp:    97.7 F (36.5 C)  TempSrc:      SpO2: 98% 97% 97% 97%  Weight:      Height:        Intake/Output Summary (Last 24 hours) at 10/26/2018 1632 Last data filed at 10/26/2018 1501 Gross per 24 hour  Intake 930 ml  Output 2235 ml  Net -1305 ml   Filed Weights   10/24/18 2052 10/25/18 1345 10/25/18 1727  Weight: 95.1 kg 95.1 kg 93.7 kg    Examination: General exam: Awake, laying in bed, in nad Respiratory [name]: Normal respiratory effort, no wheezing Extremities: Perfused, no clubbing, s/p new R BKA, existing L LE amputation  Data Reviewed: I have personally reviewed following labs and imaging studies  CBC: Recent Labs  Lab 10/22/18 1852 10/23/18 0640 10/24/18 0634 10/25/18 0618 10/26/18 0639 10/26/18 0926  WBC 16.2* 11.4* 12.3* 12.0* 12.9*  --   NEUTROABS 13.2*  --   --   --   --   --  HGB 8.2* 7.3* 7.1* 7.0* 8.7* 9.2*  HCT 28.4* 25.6* 25.3* 25.2* 30.2* 27.0*  MCV 77.2* 76.9* 77.6* 76.8* 78.9*  --   PLT 167 163 226 160 134*  --    Basic Metabolic Panel: Recent Labs  Lab 10/22/18 1852 10/23/18 0640 10/24/18 1334 10/25/18 0618 10/26/18 0926  NA 137 137 136 134* 138  K 3.0* 3.2* 3.4* 3.7 3.8  CL 98 99 102 100  --   CO2 27 26 25 25   --   GLUCOSE 220* 278* 182* 189* 185*  BUN 26* 36* 43* 34*  --   CREATININE 3.48* 4.58* 4.55* 4.75*  --   CALCIUM 7.9* 7.8* 7.9* 8.3*  --   MG  --  1.8  --   --   --   PHOS  --   --  3.8  --   --    GFR: Estimated Creatinine Clearance: 19.9 mL/min (A) (by C-G formula based on SCr of 4.75 mg/dL (H)). Liver Function Tests: Recent Labs  Lab 10/24/18 1334  ALBUMIN 2.2*   No results for input(s): LIPASE, AMYLASE in the  last 168 hours. No results for input(s): AMMONIA in the last 168 hours. Coagulation Profile: Recent Labs  Lab 10/25/18 0618  INR 1.10   Cardiac Enzymes: No results for input(s): CKTOTAL, CKMB, CKMBINDEX, TROPONINI in the last 168 hours. BNP (last 3 results) No results for input(s): PROBNP in the last 8760 hours. HbA1C: No results for input(s): HGBA1C in the last 72 hours. CBG: Recent Labs  Lab 10/25/18 1753 10/25/18 2222 10/26/18 0748 10/26/18 1208 10/26/18 1616  GLUCAP 183* 191* 172* 170* 221*   Lipid Profile: No results for input(s): CHOL, HDL, LDLCALC, TRIG, CHOLHDL, LDLDIRECT in the last 72 hours. Thyroid Function Tests: No results for input(s): TSH, T4TOTAL, FREET4, T3FREE, THYROIDAB in the last 72 hours. Anemia Panel: No results for input(s): VITAMINB12, FOLATE, FERRITIN, TIBC, IRON, RETICCTPCT in the last 72 hours. Sepsis Labs: Recent Labs  Lab 10/22/18 1906  LATICACIDVEN 1.84    Recent Results (from the past 240 hour(s))  MRSA PCR Screening     Status: None   Collection Time: 10/23/18  9:47 AM  Result Value Ref Range Status   MRSA by PCR NEGATIVE NEGATIVE Final    Comment:        The GeneXpert MRSA Assay (FDA approved for NASAL specimens only), is one component of a comprehensive MRSA colonization surveillance program. It is not intended to diagnose MRSA infection nor to guide or monitor treatment for MRSA infections. Performed at [facility], [place] [address], [place], [place] [address]   Surgical pcr screen     Status: None   Collection Time: 10/25/18  5:44 AM  Result Value Ref Range Status   MRSA, PCR NEGATIVE NEGATIVE Final   Staphylococcus aureus NEGATIVE NEGATIVE Final    Comment: (NOTE) The Xpert SA Assay (FDA approved for NASAL specimens in patients 33 years of age and older), is one component of a comprehensive surveillance program. It is not intended to diagnose infection nor to guide or monitor treatment. Performed at [facility], [place] [address], [place], [place] [address]      Radiology Studies: Dg Chest Port 1 View  Result Date: 10/25/2018 CLINICAL DATA:  Post dialysis catheter placement EXAM: PORTABLE CHEST 1 VIEW COMPARISON:  10/22/2018 FINDINGS: Right dialysis catheter tip is in the right atrium. No pneumothorax. Heart is borderline in size. Low lung volumes without confluent opacities or effusions. No  acute bony abnormality. IMPRESSION: Right dialysis catheter tips in the right atrium. Low lung volumes. Electronically Signed   By: Rolm Baptise M.D.   On: 10/25/2018 09:02   Dg Fluoro Guide Cv Line-no Report  Result Date: 10/25/2018 Fluoroscopy was utilized by the requesting physician.  No radiographic interpretation.    Scheduled Meds: . sodium chloride   Intravenous Once  . ammonium lactate   Topical BID  . [START ON 10/27/2018] aspirin EC  81 mg Oral Daily  . atorvastatin  10 mg Oral QHS  . calcitRIOL  0.5 mcg Oral Q T,Th,Sa-HD  . carvedilol  12.5 mg Oral BID WC  . darbepoetin (ARANESP) injection - DIALYSIS  200 mcg Intravenous Q Thu-HD  . docusate sodium  100 mg Oral BID  . feeding supplement (PRO-STAT SUGAR FREE 64)  30 mL Oral BID  . fentaNYL      . gabapentin  300 mg Oral QHS  . insulin aspart  0-9 Units Subcutaneous TID WC  . insulin glargine  15 Units Subcutaneous BID  . midazolam      . multivitamin  1 tablet Oral QHS  . pantoprazole  40 mg Oral Daily   Continuous Infusions: . sodium chloride    . [START ON 10/29/2018] vancomycin       LOS: 4 days   Marylu Lund, MD [facility] Pager On Amion  If 7PM-7AM, please contact night-coverage 10/26/2018, 4:32 PM

## 2018-10-26 NOTE — Anesthesia Procedure Notes (Signed)
Anesthesia Regional Block: Popliteal block   Pre-Anesthetic Checklist: ,, timeout performed, Correct Patient, Correct Site, Correct Laterality, Correct Procedure, Correct Position, site marked, Risks and benefits discussed,  Surgical consent,  Pre-op evaluation,  At surgeon's request and post-op pain management  Laterality: Right  Prep: chloraprep       Needles:  Injection technique: Single-shot  Needle Type: Echogenic Needle     Needle Length: 9cm  Needle Gauge: 21     Additional Needles:   Procedures:,,,, ultrasound used (permanent image in chart),,,,  Narrative:  Start time: 10/26/2018 10:19 AM End time: 10/26/2018 10:24 AM Injection made incrementally with aspirations every 5 mL.  Performed by: Personally  Anesthesiologist: Catalina Gravel, MD  Additional Notes: No pain on injection. No increased resistance to injection. Injection made in 5cc increments.  Good needle visualization.  Patient tolerated procedure well.

## 2018-10-26 NOTE — Progress Notes (Signed)
PROGRESS NOTE  10/25/18 note was accidentally entered as an addendum to note from 10/24/18  [name]      NIO:[id number] DOB: 1961/04/26 DOA: 10/22/2018 PCP: System, Pcp Not In          Brief Narrative:  57 y.o.malewith medical history significant ofinsulin-dependent diabetes mellitus, left BKA, right metatarsal foot amputation, peripheral neuropathy, hypertension, hyperlipidemia, tobacco abuse ESRD started on dialysis 3 weeks ago, recent history of lower extremity osteomyelitis and recent amputation presenting to the hospital for evaluation of hypotension at dialysis today. Patient was sent to the ED via EMS from dialysis. Per ED report, patient completed entire treatment.When they stood him up to weigh him at dialysis he had a syncopal/near syncopal episode and became hypotensive. He was given 650 cc bolus prior to arrival.  Patient reports having pain in his right foot which started a few days after his recent hospital discharge. States some days he is unable to bear weight on that leg. States he started noticing a few days ago that the bottom of his right foot has been peeling. Denies having any fevers, chills, nausea, or vomiting. States his appetite has been reduced since his prior hospitalization. He did finish a full course of doxycycline after his recent discharge.  Patient was recently admitted from September 09, 2018 to September 20 2018.He was transitioned to hemodialysis during this hospitalization. MRI of his right stump revealed early osteomyelitis. MRSA screening positive. He was treated with 10 days of IV antibiotics including vancomycin. Vascular surgery was consulted and patient underwent revascularization of his right lower extremity in September 16, 2018. Hospital course was complicated by intermittent severe pain in his right stump. Infectious disease was consulted and recommended doxycycline for 5 days.  ED Course:Hemodynamically stable. White  count 16.2;was 16.8 a month ago and has previously been elevated as well. Lactic acid normal. Chest x-ray showing no acute cardiopulmonary process. X-ray of right foot showing evidence of soft tissue air in the stump region as well as lateral to the fifth metatarsal remnant; no bony destruction evident. Patient received 500 cc normal saline bolus, Norco, and vancomycin in the ED.  Assessment & Plan:   Principal Problem:   Right foot pain Active Problems:   HTN (hypertension)   Type 2 diabetes mellitus (HCC)   HLD (hyperlipidemia)   PVD (peripheral vascular disease) (HCC)   Hypokalemia   ESRD (end stage renal disease) (HCC)   Chronic anemia   Ambulatory dysfunction   GERD (gastroesophageal reflux disease)  Right foot(stump)pain,suspect2/2infection/ osteomyelitis -Recently admitted for right lower extremity cellulitis with early changes of osteomyelitis on MRI.MRSA screening waspositive. He was treated with 10 days of IV antibiotics including vancomycin andtreated with a 5-day course of doxycycline.Heunderwent revascularization of his right lower extremity in September 16, 2018.  -Currently afebrile and hemodynamically stable. White count 16.2;was 16.8 a month ago and has previously been elevated as well. Lactic acid normal. X-ray of right foot showing evidence of soft tissue air in the stump region as well as lateral to the fifth metatarsal remnant; no bony destruction evident. -Continued on IV Vancomycin -R foot MRI reviewed with patient and Orthopedic Surgery. Concerns of osteomyelitis noted -Pain management: Norco 5-325 mg 1tabletevery 6 hours as needed -Wound care consulted -Orthopedic Surgery following with plans for surgery 11/23  Peripheral vascular disease Status post revascularization of right lower extremity in September 16, 2018. -Continue home Plavix, Lipitor as tolerated -No evidence of acute bleeding  Hypokalemia -Continue to follow electrolytes  and correct as  needed  New end-stage renal disease on dialysisTTS -Nephrology consulted and following -Continue HD as tolerated -Vascular Surgery following, placed RIJ tunneled cath 11/22  Anemia of chronic disease/iron deficiency anemia -Hemoglobin 8.2 at presentation. Remains hemodynamically stable -Continue to follow, transfuse as needed  Ambulatory dysfunction due to amputation/right foot pain -PT consulted -Stable  Hypertension -Continue home Coreg as tolerated -BP stable at this time  Hyperlipidemia -Continue home Lipitor as tolerated, stable  Type 2 diabetes A1c 7.5 on September 10, 2018. Most recent CBG 329. -Continue home Lantus 15 units twice daily -Sliding scale insulin sensitive -stable glucose at this time  GERD -Continue PPI as tolerated -Currently stable  DVT prophylaxis: Heparin subQ Code Status: Full Family Communication: Pt in room, family not at bedside Disposition Plan: Uncertain at this time  Consultants:   Nephrology  Orthopedic Surgery  Procedures:     Antimicrobials:             Anti-infectives (From admission, onward)     Start        Dose/Rate  Route  Frequency  Ordered  Stop     10/25/18 0000    ceFAZolin (ANCEF) IVPB 1 g/50 mL premix     Note to Pharmacy:  Send with pt to OR     1 g  100 mL/hr over 30 Minutes  Intravenous  On call  10/24/18 1508  10/26/18 0000     10/24/18 1200    vancomycin (VANCOCIN) IVPB 1000 mg/200 mL premix       1,000 mg  200 mL/hr over 60 Minutes  Intravenous  Every T-Th-Sa (Hemodialysis)  10/22/18 2223        10/22/18 2215    vancomycin (VANCOCIN) 2,000 mg in sodium chloride 0.9 % 500 mL IVPB       2,000 mg  250 mL/hr over 120 Minutes  Intravenous   Once  10/22/18 2209  10/23/18 0131        Subjective: No complaints at this time  Objective:       Vitals:   10/24/18 1330 10/24/18 1400 10/24/18 1430 10/24/18 1500  BP: 120/67  107/66 117/65 110/63  Pulse: 77 80 74 75  Resp:      Temp:      TempSrc:      SpO2:      Weight:      Height:        Intake/Output Summary (Last 24 hours) at 10/24/2018 1525 Last data filed at 10/24/2018 0900    Gross per 24 hour  Intake 610 ml  Output 200 ml  Net 410 ml        Filed Weights   10/23/18 1429 10/23/18 2051 10/24/18 1247  Weight: 96.6 kg 96.6 kg 94.5 kg    Examination: General exam: Conversant, in no acute distress Respiratory system: normal chest rise, clear, no audible wheezing Extremities: s/p [name] amputation, partial R foot amputation  Data Reviewed: I have personally reviewed following labs and imaging studies  CBC: LastLabs       Recent Labs  Lab 10/22/18 1852 10/23/18 0640 10/24/18 0634  WBC 16.2* 11.4* 12.3*  NEUTROABS 13.2*  --   --   HGB 8.2* 7.3* 7.1*  HCT 28.4* 25.6* 25.3*  MCV 77.2* 76.9* 77.6*  PLT 167 163 226     Basic Metabolic Panel: LastLabs  Recent Labs  Lab 10/22/18 1852 10/23/18 0640 10/24/18 1334  NA 137 137 136  K 3.0* 3.2* 3.4*  CL 98 99 102  CO2 27  26 25  GLUCOSE 220* 278* 182*  BUN 26* 36* 43*  CREATININE 3.48* 4.58* 4.55*  CALCIUM 7.9* 7.8* 7.9*  MG  --  1.8  --   PHOS  --   --  3.8     GFR: Estimated Creatinine Clearance: 20.9 mL/min (A) (by C-G formula based on SCr of 4.55 mg/dL (H)). Liver Function Tests: LastLabs     Recent Labs  Lab 10/24/18 1334  ALBUMIN 2.2*     LastLabs  No results for input(s): LIPASE, AMYLASE in the last 168 hours.   LastLabs  No results for input(s): AMMONIA in the last 168 hours.   Coagulation Profile: LastLabs  No results for input(s): INR, PROTIME in the last 168 hours.   Cardiac Enzymes: LastLabs  No results for input(s): CKTOTAL, CKMB, CKMBINDEX, TROPONINI in the last 168 hours.   BNP (last 3 results) RecentLabs(withinlast365days)  No results for input(s): PROBNP in the last 8760 hours.    HbA1C: RecentLabs(last2labs)  No results for input(s): HGBA1C in the last 72 hours.   CBG: LastLabs         Recent Labs  Lab 10/23/18 1130 10/23/18 1617 10/23/18 2051 10/24/18 0755 10/24/18 1121  GLUCAP 297* 300* 188* 259* 277*     Lipid Profile: RecentLabs(last2labs)  No results for input(s): CHOL, HDL, LDLCALC, TRIG, CHOLHDL, LDLDIRECT in the last 72 hours.   Thyroid Function Tests: RecentLabs(last2labs)  No results for input(s): TSH, T4TOTAL, FREET4, T3FREE, THYROIDAB in the last 72 hours.   Anemia Panel: RecentLabs(last2labs)  No results for input(s): VITAMINB12, FOLATE, FERRITIN, TIBC, IRON, RETICCTPCT in the last 72 hours.   Sepsis Labs: LastLabs     Recent Labs  Lab 10/22/18 1906  LATICACIDVEN 1.84             Recent Results (from the past 240 hour(s))  MRSA PCR Screening     Status: None   Collection Time: 10/23/18  9:47 AM  Result Value Ref Range Status   MRSA by PCR NEGATIVE NEGATIVE Final    Comment:        The GeneXpert MRSA Assay (FDA approved for NASAL specimens only), is one component of a comprehensive MRSA colonization surveillance program. It is not intended to diagnose MRSA infection nor to guide or monitor treatment for MRSA infections. Performed at [facility], [place] [address] [place], [place] [address]      Radiology Studies:  ImagingResults(Last48hours)  Dg Chest 2 View  Result Date: 10/22/2018 CLINICAL DATA:  Near syncope. History of diabetes and chronic kidney disease. EXAM: CHEST - 2 VIEW COMPARISON:  Chest radiograph September 13, 2018 FINDINGS: Cardiomediastinal silhouette is normal. No pleural effusions or focal consolidations. Trachea projects midline and there is no pneumothorax. Tunneled dialysis catheter via RIGHT internal jugular venous approach with distal tip projecting distal superior vena cava. Soft tissue planes and included osseous structures are non-suspicious.  IMPRESSION: No acute cardiopulmonary process. Electronically Signed   By: Elon Alas M.D.   On: 10/22/2018 20:56   Mr Foot Right Wo Contrast  Result Date: 10/23/2018 CLINICAL DATA:  Wound infection. Status post amputation with open wound. EXAM: MRI OF THE RIGHT FOREFOOT WITHOUT CONTRAST TECHNIQUE: Multiplanar, multisequence MR imaging of the right foot was performed. No intravenous contrast was administered. COMPARISON:  Radiographs 10/22/2018 and prior MRI 09/11/2018 FINDINGS: Bones/Joint/Cartilage There is an open wound involving the plantar aspect of the foot at the amputation site. Focal cellulitis without discrete drainable soft tissue abscess. Gas is noted in the soft  tissues. There is also abnormal T1 and T2 signal intensity in the base of the fifth metatarsal and also in the base of the fourth metatarsal consistent with osteomyelitis. No other areas of osteomyelitis are identified. No findings to suggest septic arthritis. Mild myositis involving the short flexor muscles of the foot. Chronic Achilles tendinopathy. Plantar fascia is intact. IMPRESSION: 1. Open wound on the plantar and lateral aspect of the midfoot with cellulitis and myositis but no discrete drainable soft tissue abscess. 2. MR findings consistent with osteomyelitis involving the bases of the fourth and fifth metatarsals. 3. Chronic Achilles tendinopathy. Electronically Signed   By: Marijo Sanes M.D.   On: 10/23/2018 07:38   Dg Foot Complete Right  Result Date: 10/22/2018 CLINICAL DATA:  Nonhealing wound. EXAM: RIGHT FOOT COMPLETE - 3+ VIEW COMPARISON:  Right foot MRI September 11, 2018 FINDINGS: Frontal, oblique and lateral views were obtained. Patient has had amputations at the levels of the proximal metatarsals, unchanged. There is evidence soft tissue air in the stump region as well as lateral to the fifth metatarsal remnant. No acute fracture or dislocation is evident. There is no erosive change or bony destruction.  There is extensive arterial vascular calcification. IMPRESSION: Air within the distal soft tissues raises concern for soft tissue infection. No bony destruction evident. No acute fracture or dislocation. Amputation sites as noted. Extensive arterial vascular calcification throughout the foot noted. Electronically Signed   By: Lowella Grip III M.D.   On: 10/22/2018 20:55     Scheduled Meds: . ammonium lactate   Topical BID  . [START ON 10/27/2018] aspirin EC  81 mg Oral Daily  . atorvastatin  10 mg Oral QHS  . calcitRIOL  0.5 mcg Oral Q T,Th,Sa-HD  . carvedilol  12.5 mg Oral BID WC  . [START ON 10/27/2018] clopidogrel  75 mg Oral Daily  . Darbepoetin Alfa      . darbepoetin (ARANESP) injection - DIALYSIS  200 mcg Intravenous Q Thu-HD  . gabapentin  300 mg Oral QHS  . heparin  5,000 Units Subcutaneous Q8H  . insulin aspart  0-9 Units Subcutaneous TID WC  . insulin glargine  15 Units Subcutaneous BID  . multivitamin  1 tablet Oral QHS  . pantoprazole  40 mg Oral Daily   Continuous Infusions: . [START ON 10/25/2018]  ceFAZolin (ANCEF) IV    . vancomycin       LOS: 2 days   Marylu Lund, MD [facility] Pager On Amion  If 7PM-7AM, please contact night-coverage 10/24/2018, 3:25 PM

## 2018-10-26 NOTE — Anesthesia Procedure Notes (Signed)
Anesthesia Regional Block: Adductor canal block   Pre-Anesthetic Checklist: ,, timeout performed, Correct Patient, Correct Site, Correct Laterality, Correct Procedure, Correct Position, site marked, Risks and benefits discussed,  Surgical consent,  Pre-op evaluation,  At surgeon's request and post-op pain management  Laterality: Right  Prep: chloraprep       Needles:  Injection technique: Single-shot  Needle Type: Echogenic Needle     Needle Length: 9cm  Needle Gauge: 21     Additional Needles:   Procedures:,,,, ultrasound used (permanent image in chart),,,,  Narrative:  Start time: 10/26/2018 10:24 AM End time: 10/26/2018 10:27 AM Injection made incrementally with aspirations every 5 mL.  Performed by: Personally  Anesthesiologist: Catalina Gravel, MD  Additional Notes: No pain on injection. No increased resistance to injection. Injection made in 5cc increments.  Good needle visualization.  Patient tolerated procedure well.

## 2018-10-26 NOTE — Progress Notes (Signed)
RN verified the presence of a signed informed consent that matches stated procedure by patient. Verified armband matches patient's stated name and birth date. Verified NPO status and that all jewelry, contact, glasses, dentures, and partials had been removed (if applicable).  

## 2018-10-26 NOTE — Progress Notes (Signed)
Pt is becoming hysterical, will not stop calling for pain meds. Pain is not controlled. Have given everything for pain that can be given.  Cannot assess BKA site d/t brace on . Messaged MD again to come and see pt. Pt wants to see MD.   Eleanora Neighbor, RN

## 2018-10-26 NOTE — Anesthesia Preprocedure Evaluation (Addendum)
Anesthesia Evaluation  Patient identified by MRN, date of birth, ID band Patient awake    Reviewed: Allergy & Precautions, NPO status , Patient's Chart, lab work & pertinent test results  Airway Mallampati: II  TM Distance: >3 FB Neck ROM: Full    Dental  (+) Dental Advisory Given, Partial Upper, Missing,    Pulmonary former smoker,    Pulmonary exam normal breath sounds clear to auscultation       Cardiovascular hypertension, Pt. on medications (-) angina+ Peripheral Vascular Disease  (-) Cardiac Stents Normal cardiovascular exam Rhythm:Regular Rate:Normal     Neuro/Psych  Neuromuscular disease negative psych ROS   GI/Hepatic Neg liver ROS, GERD  ,  Endo/Other  diabetes, Type 2, Insulin Dependent  Renal/GU ESRF and DialysisRenal disease (MWF)     Musculoskeletal negative musculoskeletal ROS (+)   Abdominal   Peds  Hematology  (+) Blood dyscrasia (Thrombocytopenia), anemia ,   Anesthesia Other Findings Day of surgery medications reviewed with the patient.  Reproductive/Obstetrics                            Anesthesia Physical Anesthesia Plan  ASA: III  Anesthesia Plan: MAC and Regional   Post-op Pain Management:    Induction: Intravenous  PONV Risk Score and Plan: 1 and Propofol infusion and Treatment may vary due to age or medical condition  Airway Management Planned: Nasal Cannula and Natural Airway  Additional Equipment:   Intra-op Plan:   Post-operative Plan:   Informed Consent: I have reviewed the patients History and Physical, chart, labs and discussed the procedure including the risks, benefits and alternatives for the proposed anesthesia with the patient or authorized representative who has indicated his/her understanding and acceptance.   Dental advisory given  Plan Discussed with: CRNA and Anesthesiologist  Anesthesia Plan Comments:        Anesthesia Quick  Evaluation

## 2018-10-26 NOTE — Anesthesia Postprocedure Evaluation (Signed)
Anesthesia Post Note  Patient: [name]  Procedure(s) Performed: AMPUTATION BELOW KNEE (Right )     Patient location during evaluation: PACU Anesthesia Type: Regional Level of consciousness: awake and alert, awake and oriented Pain management: pain level controlled Vital Signs Assessment: post-procedure vital signs reviewed and stable Respiratory status: spontaneous breathing, nonlabored ventilation and respiratory function stable Cardiovascular status: stable and blood pressure returned to baseline Postop Assessment: no apparent nausea or vomiting Anesthetic complications: no    Last Vitals:  Vitals:   10/26/18 1245 10/26/18 1300  BP: 132/82 127/89  Pulse: 65 68  Resp: 16 18  Temp:    SpO2: 97% 97%    Last Pain:  Vitals:   10/26/18 1300  TempSrc:   PainSc: 0-No pain                 Catalina Gravel

## 2018-10-26 NOTE — Transfer of Care (Signed)
Immediate Anesthesia Transfer of Care Note  Patient: [name]  Procedure(s) Performed: AMPUTATION BELOW KNEE (Right )  Patient Location: PACU  Anesthesia Type:MAC and Regional  Level of Consciousness: awake, patient cooperative and responds to stimulation  Airway & Oxygen Therapy: Patient Spontanous Breathing  Post-op Assessment: Report given to RN and Post -op Vital signs reviewed and stable  Post vital signs: Reviewed and stable  Last Vitals:  Vitals Value Taken Time  BP    Temp    Pulse    Resp    SpO2      Last Pain:  Vitals:   10/26/18 1055  TempSrc:   PainSc: 0-No pain      Patients Stated Pain Goal: 0 (97/67/34 1937)  Complications: No apparent anesthesia complications

## 2018-10-27 ENCOUNTER — Encounter (HOSPITAL_COMMUNITY): Payer: Self-pay | Admitting: Orthopedic Surgery

## 2018-10-27 LAB — GLUCOSE, CAPILLARY
GLUCOSE-CAPILLARY: 231 mg/dL — AB (ref 70–99)
GLUCOSE-CAPILLARY: 219 mg/dL — AB (ref 70–99)
Glucose-Capillary: 155 mg/dL — ABNORMAL HIGH (ref 70–99)

## 2018-10-27 LAB — VANCOMYCIN, RANDOM: VANCOMYCIN RM: 20

## 2018-10-27 MED ORDER — CHLORHEXIDINE GLUCONATE CLOTH 2 % EX PADS
6.0000 | MEDICATED_PAD | Freq: Every day | CUTANEOUS | Status: DC
  Administered 2018-10-27 – 2018-11-01 (×6): 6 via TOPICAL

## 2018-10-27 MED ORDER — VANCOMYCIN HCL IN DEXTROSE 1-5 GM/200ML-% IV SOLN
1000.0000 mg | Freq: Once | INTRAVENOUS | Status: DC
  Filled 2018-10-27: qty 200

## 2018-10-27 MED ORDER — HEPARIN SODIUM (PORCINE) 5000 UNIT/ML IJ SOLN
5000.0000 [IU] | Freq: Three times a day (TID) | INTRAMUSCULAR | Status: DC
  Administered 2018-10-27 – 2018-11-01 (×15): 5000 [IU] via SUBCUTANEOUS
  Filled 2018-10-27 (×13): qty 1

## 2018-10-27 MED ORDER — INSULIN ASPART 100 UNIT/ML ~~LOC~~ SOLN
3.0000 [IU] | Freq: Three times a day (TID) | SUBCUTANEOUS | Status: DC
  Administered 2018-10-27 – 2018-11-01 (×12): 3 [IU] via SUBCUTANEOUS

## 2018-10-27 MED ORDER — ACETAMINOPHEN 325 MG PO TABS
650.0000 mg | ORAL_TABLET | Freq: Four times a day (QID) | ORAL | Status: DC | PRN
  Administered 2018-10-27: 650 mg via ORAL
  Filled 2018-10-27: qty 2

## 2018-10-27 MED ORDER — INSULIN GLARGINE 100 UNIT/ML ~~LOC~~ SOLN
20.0000 [IU] | Freq: Two times a day (BID) | SUBCUTANEOUS | Status: DC
  Administered 2018-10-27 – 2018-11-01 (×9): 20 [IU] via SUBCUTANEOUS
  Filled 2018-10-27 (×12): qty 0.2

## 2018-10-27 MED ORDER — VANCOMYCIN HCL IN DEXTROSE 1-5 GM/200ML-% IV SOLN: 1000.0000 mg | INTRAVENOUS | Status: DC

## 2018-10-27 NOTE — Progress Notes (Signed)
Subjective: 1 Day Post-Op Procedure(s) (LRB): AMPUTATION BELOW KNEE (Right) Patient reports pain as moderate to severe. Reports severe pain overnight. Currently better controlled. Does not recall pain being this severe when L side was done, has Dilaudid PCA pump. No other c/o this AM.  Objective: Vital signs in last 24 hours: Temp:  [97.7 F (36.5 C)-98.9 F (37.2 C)] 98.8 F (37.1 C) (11/24 0756) Pulse Rate:  [65-87] 87 (11/24 0756) Resp:  [10-18] 18 (11/24 0756) BP: (108-173)/(70-89) 173/88 (11/24 0756) SpO2:  [95 %-99 %] 98 % (11/24 0756) Weight:  [94.3 kg] 94.3 kg (11/23 2134)  Intake/Output from previous day: 11/23 0701 - 11/24 0700 In: 570 [P.O.:420; I.V.:150] Out: 850 [Urine:850] Intake/Output this shift: No intake/output data recorded.  Recent Labs    10/25/18 0618 10/26/18 0639 10/26/18 0926  HGB 7.0* 8.7* 9.2*   Recent Labs    10/25/18 0618 10/26/18 0639 10/26/18 0926  WBC 12.0* 12.9*  --   RBC 3.28* 3.83*  --   HCT 25.2* 30.2* 27.0*  PLT 160 134*  --    Recent Labs    10/24/18 1334 10/25/18 0618 10/26/18 0926  NA 136 134* 138  K 3.4* 3.7 3.8  CL 102 100  --   CO2 25 25  --   BUN 43* 34*  --   CREATININE 4.55* 4.75*  --   GLUCOSE 182* 189* 185*  CALCIUM 7.9* 8.3*  --    Recent Labs    10/25/18 0618  INR 1.10    Neurologically intact ABD soft Neurovascular intact Sensation intact distally Intact pulses distally Dorsiflexion/Plantar flexion intact Incision: dressing C/D/I and no drainage No cellulitis present Compartment soft no sign of DVT  Assessment/Plan: 1 Day Post-Op Procedure(s) (LRB): AMPUTATION BELOW KNEE (Right) Advance diet Up with therapy D/C IV fluids  Resume DVT ppx- ASA ordered Seems more comfortable today but may benefit from anti-anxiolytic tonight if pain control is again an issue NWB RLE  ,  M. 10/27/2018, 8:11 AM

## 2018-10-27 NOTE — Plan of Care (Signed)
  Problem: Clinical Measurements: Goal: Ability to maintain clinical measurements within normal limits will improve Outcome: Progressing Goal: Will remain free from infection Outcome: Progressing   Problem: Activity: Goal: Ability to perform//tolerate increased activity and mobilize with assistive devices will improve Outcome: Progressing   Problem: Clinical Measurements: Goal: Postoperative complications will be avoided or minimized Outcome: Progressing   Problem: Self-Care: Goal: Ability to meet self-care needs will improve Outcome: Progressing   Problem: Self-Concept: Goal: Ability to maintain and perform role responsibilities to the fullest extent possible will improve Outcome: Progressing   Problem: Pain Management: Goal: Pain level will decrease with appropriate interventions Outcome: Progressing   Problem: Skin Integrity: Goal: Demonstration of wound healing without infection will improve Outcome: Progressing

## 2018-10-27 NOTE — Progress Notes (Addendum)
Pharmacy Antibiotic Note  [name] is a 57 y.o. male admitted on 10/22/2018 with wound infection.  Pharmacy onsulted 11/19 for vancomycin dosing. Patient previously discharged ~3 weeks ago after receiving 10 days IV antibiotics including vancomycin for osteomyelitis of right foot.  Abx D#5 for stump infection/osteo. MRI confirmed osteo -  11/23 S/p R BKA  Afeb, Tm 98.9,  WBC decreased to  12.9<11.4 << 16.2k  ESRD-HD qTTS: Last HD done 11/22 x4 , BFR 400,  Last Vancomyciin dose given in HD on 11/22 Vancomycin random level = 20 mcg/ml on 11/24s AM.  Okay to redose Vancomycin after next HD.   Usual HD TTS has been changed to tomorrow Monday 10/28/18 d/t holiday schedule.   Plan: Vancomycin  1000 mg IV qHD T/Th/S,  Next HD Monday 11/25 d/t holiday schedule next week, thus will give Vanc 1g dose after HD tomorrow 11/25  F/u dialysis schedule and LOT Monitor pre-HD vancomycin random prn   Height: 6' (182.9 cm) Weight: 207 lb 14.3 oz (94.3 kg) IBW/kg (Calculated) : 77.6  Temp (24hrs), Avg:98.9 F (37.2 C), Min:98.8 F (37.1 C), Max:98.9 F (37.2 C)  Recent Labs  Lab 10/22/18 1852 10/22/18 1906 10/23/18 0640 10/24/18 0634 10/24/18 1334 10/25/18 0618 10/26/18 0639 10/27/18 0940  WBC 16.2*  --  11.4* 12.3*  --  12.0* 12.9*  --   CREATININE 3.48*  --  4.58*  --  4.55* 4.75*  --   --   LATICACIDVEN  --  1.84  --   --   --   --   --   --   VANCORANDOM  --   --   --   --   --   --   --  20    Estimated Creatinine Clearance: 20 mL/min (A) (by C-G formula based on SCr of 4.75 mg/dL (H)).    No Known Allergies  Antimicrobials this admission: Vancomycin 11/19>>  Microbiology results: 11/22 Surgical PCR: MRSA PCR: neg,  Staph Aureus:  negative 11/20 MRSA PCR : negative  Thank you for allowing pharmacy to be a part of this patient's care. Nicole Cella, RPh Clinical Pharmacist Pager: [phone] Please check AMION for all [place] phone numbers After 10:00 PM, call Dewy Rose [phone] 10/27/2018 2:42 PM

## 2018-10-27 NOTE — Progress Notes (Signed)
PROGRESS NOTE    [name]  LGX:[id number] DOB: [birth year] DOA: 10/22/2018 PCP: System, Pcp Not In    Brief Narrative:  57 y.o. male with medical history significant of insulin-dependent diabetes mellitus, left BKA, right metatarsal foot amputation, peripheral neuropathy, hypertension, hyperlipidemia, tobacco abuse ESRD started on dialysis 3 weeks ago, recent history of lower extremity osteomyelitis and recent amputation presenting to the hospital for evaluation of hypotension at dialysis today.  Patient was sent to the ED via EMS from dialysis.  Per ED report, patient completed entire treatment.  When they stood him up to weigh him at dialysis he had a syncopal/near syncopal episode and became hypotensive.  He was given 650 cc bolus prior to arrival.  Patient reports having pain in his right foot which started a few days after his recent hospital discharge.  States some days he is unable to bear weight on that leg.  States he started noticing a few days ago that the bottom of his right foot has been peeling.  Denies having any fevers, chills, nausea, or vomiting.  States his appetite has been reduced since his prior hospitalization.  He did finish a full course of doxycycline after his recent discharge.  Patient was recently admitted from September 09, 2018 to September 20 2018.  He was transitioned to hemodialysis during this hospitalization.  MRI of his right stump revealed early osteomyelitis.  MRSA screening positive.  He was treated with 10 days of IV antibiotics including vancomycin.  Vascular surgery was consulted and patient underwent revascularization of his right lower extremity in September 16, 2018.  Hospital course was complicated by intermittent severe pain in his right stump.  Infectious disease was consulted and recommended doxycycline for 5 days.  ED Course: Hemodynamically stable.  White count 16.2; was 16.8 a month ago and has previously been elevated as well.  Lactic acid  normal.  Chest x-ray showing no acute cardiopulmonary process.  X-ray of right foot showing evidence of soft tissue air in the stump region as well as lateral to the fifth metatarsal remnant; no bony destruction evident.  Patient received 500 cc normal saline bolus, Norco, and vancomycin in the ED.  Assessment & Plan:   Principal Problem:   Right foot pain Active Problems:   HTN (hypertension)   Type 2 diabetes mellitus (HCC)   HLD (hyperlipidemia)   PVD (peripheral vascular disease) (HCC)   Hypokalemia   ESRD (end stage renal disease) (HCC)   Chronic anemia   Ambulatory dysfunction   GERD (gastroesophageal reflux disease)  Right foot (stump) pain, suspect 2/2 infection/ osteomyelitis -Recently admitted for right lower extremity cellulitis with early changes of osteomyelitis on MRI.  MRSA screening was positive.  He was treated with 10 days of IV antibiotics including vancomycin and treated with a 5-day course of doxycycline. He underwent revascularization of his right lower extremity in September 16, 2018.   -Currently afebrile and hemodynamically stable.  White count 16.2; was 16.8 a month ago and has previously been elevated as well.  Lactic acid normal.  X-ray of right foot showing evidence of soft tissue air in the stump region as well as lateral to the fifth metatarsal remnant; no bony destruction evident. -Continued on IV Vancomycin -R foot MRI reviewed with patient and Orthopedic Surgery. Concerns of osteomyelitis noted -Pain management: Very poorly controlled. Resorted to PCA overnight -Wound care consulted earlier this admit -Pt is now s/p R BKA 11/23 -continue PT per Orthopedic Surgery  Peripheral vascular disease Status post  revascularization of right lower extremity in September 16, 2018. -Continue home Plavix, Lipitor as tolerated -No evidence of acute bleeding  Hypokalemia -Continue to follow electrolytes and correct as needed  New end-stage renal disease on dialysis  TTS -Nephrology consulted and following -Continue HD as tolerated -Vascular Surgery following, placed RIJ tunneled cath 11/22  Anemia of chronic disease/iron deficiency anemia -Hemoglobin 8.2 at presentation. Remains hemodynamically stable -Continue to follow, transfuse as needed  Ambulatory dysfunction due to amputation/right foot pain -PT consulted -s/p amputation per above  Hypertension -Continue home Coreg as tolerated -BP stable at this time  Hyperlipidemia -Continue home Lipitor as tolerated, stable  Type 2 diabetes A1c 7.5 on September 10, 2018.  Most recent CBG 329. -Continue home Lantus 15 units twice daily -Sliding scale insulin sensitive -glucose in the mid-150/170's to 200's -Increase lantus to 20 units -will add pre-meal coverage with novolog 3 units  GERD -Continue PPI as tolerated -Currently stable  DVT prophylaxis: Heparin subQ Code Status: Full Family Communication: Pt in room, family not at bedside Disposition Plan: Uncertain at this time  Consultants:   Nephrology  Orthopedic Surgery  Procedures:   R BKA 11/23  Antimicrobials: Anti-infectives (From admission, onward)   Start     Dose/Rate Route Frequency Ordered Stop   10/31/18 1200  vancomycin (VANCOCIN) IVPB 1000 mg/200 mL premix     1,000 mg 200 mL/hr over 60 Minutes Intravenous Every T-Th-Sa (Hemodialysis) 10/27/18 1430     10/29/18 1200  vancomycin (VANCOCIN) IVPB 1000 mg/200 mL premix  Status:  Discontinued     1,000 mg 200 mL/hr over 60 Minutes Intravenous Every T-Th-Sa (Hemodialysis) 10/25/18 1300 10/27/18 1430   10/28/18 1200  vancomycin (VANCOCIN) IVPB 1000 mg/200 mL premix     1,000 mg 200 mL/hr over 60 Minutes Intravenous  Once 10/27/18 1430     10/26/18 1120  vancomycin (VANCOCIN) powder  Status:  Discontinued       As needed 10/26/18 1123 10/26/18 1203   10/25/18 1400  vancomycin (VANCOCIN) IVPB 1000 mg/200 mL premix     1,000 mg 200 mL/hr over 60 Minutes Intravenous  Every M-W-F (Hemodialysis) 10/25/18 1259 10/25/18 1736   10/25/18 0700  ceFAZolin (ANCEF) IVPB 2g/100 mL premix     2 g 200 mL/hr over 30 Minutes Intravenous On call 10/24/18 1508 10/25/18 0744   10/24/18 1200  vancomycin (VANCOCIN) IVPB 1000 mg/200 mL premix  Status:  Discontinued     1,000 mg 200 mL/hr over 60 Minutes Intravenous Every T-Th-Sa (Hemodialysis) 10/22/18 2223 10/25/18 1300   10/22/18 2215  vancomycin (VANCOCIN) 2,000 mg in sodium chloride 0.9 % 500 mL IVPB     2,000 mg 250 mL/hr over 120 Minutes Intravenous  Once 10/22/18 2209 10/23/18 0131      Subjective: Complaints of continued post-op R stump pain despite PCA  Objective: Vitals:   10/27/18 0417 10/27/18 0756 10/27/18 0800 10/27/18 1244  BP:  (!) 173/88    Pulse:  87    Resp: 12 18 11 15   Temp:  98.8 F (37.1 C)    TempSrc:  Oral    SpO2: 98% 98% 97% 100%  Weight:      Height:        Intake/Output Summary (Last 24 hours) at 10/27/2018 1636 Last data filed at 10/27/2018 1310 Gross per 24 hour  Intake 720 ml  Output 400 ml  Net 320 ml   Filed Weights   10/25/18 1345 10/25/18 1727 10/26/18 2134  Weight: 95.1 kg 93.7 kg  94.3 kg    Examination: General exam: Conversant, in no acute distress Respiratory system: normal chest rise, clear, no audible wheezing Extremities: No cyanosis, no joint deformities, s/p recent RBKA, s/p prior [name] amputation  Data Reviewed: I have personally reviewed following labs and imaging studies  CBC: Recent Labs  Lab 10/22/18 1852 10/23/18 0640 10/24/18 0634 10/25/18 0618 10/26/18 0639 10/26/18 0926  WBC 16.2* 11.4* 12.3* 12.0* 12.9*  --   NEUTROABS 13.2*  --   --   --   --   --   HGB 8.2* 7.3* 7.1* 7.0* 8.7* 9.2*  HCT 28.4* 25.6* 25.3* 25.2* 30.2* 27.0*  MCV 77.2* 76.9* 77.6* 76.8* 78.9*  --   PLT 167 163 226 160 134*  --    Basic Metabolic Panel: Recent Labs  Lab 10/22/18 1852 10/23/18 0640 10/24/18 1334 10/25/18 0618 10/26/18 0926  NA 137 137 136 134*  138  K 3.0* 3.2* 3.4* 3.7 3.8  CL 98 99 102 100  --   CO2 27 26 25 25   --   GLUCOSE 220* 278* 182* 189* 185*  BUN 26* 36* 43* 34*  --   CREATININE 3.48* 4.58* 4.55* 4.75*  --   CALCIUM 7.9* 7.8* 7.9* 8.3*  --   MG  --  1.8  --   --   --   PHOS  --   --  3.8  --   --    GFR: Estimated Creatinine Clearance: 20 mL/min (A) (by C-G formula based on SCr of 4.75 mg/dL (H)). Liver Function Tests: Recent Labs  Lab 10/24/18 1334  ALBUMIN 2.2*   No results for input(s): LIPASE, AMYLASE in the last 168 hours. No results for input(s): AMMONIA in the last 168 hours. Coagulation Profile: Recent Labs  Lab 10/25/18 0618  INR 1.10   Cardiac Enzymes: No results for input(s): CKTOTAL, CKMB, CKMBINDEX, TROPONINI in the last 168 hours. BNP (last 3 results) No results for input(s): PROBNP in the last 8760 hours. HbA1C: No results for input(s): HGBA1C in the last 72 hours. CBG: Recent Labs  Lab 10/26/18 1208 10/26/18 1616 10/26/18 2138 10/27/18 0757 10/27/18 1134  GLUCAP 170* 221* 152* 231* 219*   Lipid Profile: No results for input(s): CHOL, HDL, LDLCALC, TRIG, CHOLHDL, LDLDIRECT in the last 72 hours. Thyroid Function Tests: No results for input(s): TSH, T4TOTAL, FREET4, T3FREE, THYROIDAB in the last 72 hours. Anemia Panel: No results for input(s): VITAMINB12, FOLATE, FERRITIN, TIBC, IRON, RETICCTPCT in the last 72 hours. Sepsis Labs: Recent Labs  Lab 10/22/18 1906  LATICACIDVEN 1.84    Recent Results (from the past 240 hour(s))  MRSA PCR Screening     Status: None   Collection Time: 10/23/18  9:47 AM  Result Value Ref Range Status   MRSA by PCR NEGATIVE NEGATIVE Final    Comment:        The GeneXpert MRSA Assay (FDA approved for NASAL specimens only), is one component of a comprehensive MRSA colonization surveillance program. It is not intended to diagnose MRSA infection nor to guide or monitor treatment for MRSA infections. Performed at [facility], [place] [address] [place], [place] [address]   Surgical pcr screen     Status: None   Collection Time: 10/25/18  5:44 AM  Result Value Ref Range Status   MRSA, PCR NEGATIVE NEGATIVE Final   Staphylococcus aureus NEGATIVE NEGATIVE Final    Comment: (NOTE) The Xpert SA Assay (FDA approved for NASAL specimens in patients 22 years of  age and older), is one component of a comprehensive surveillance program. It is not intended to diagnose infection nor to guide or monitor treatment. Performed at [facility], [place] [address], [place], [place] [address]      Radiology Studies: No results found.  Scheduled Meds: . sodium chloride   Intravenous Once  . ammonium lactate   Topical BID  . aspirin EC  81 mg Oral Daily  . atorvastatin  10 mg Oral QHS  . calcitRIOL  0.5 mcg Oral Q T,Th,Sa-HD  . carvedilol  12.5 mg Oral BID WC  . Chlorhexidine Gluconate Cloth  6 each Topical Q0600  . darbepoetin (ARANESP) injection - DIALYSIS  200 mcg Intravenous Q Thu-HD  . docusate sodium  100 mg Oral BID  . feeding supplement (PRO-STAT SUGAR FREE 64)  30 mL Oral BID  . gabapentin  300 mg Oral QHS  . heparin injection (subcutaneous)  5,000 Units Subcutaneous Q8H  . HYDROmorphone   Intravenous Q4H  . insulin aspart  0-9 Units Subcutaneous TID WC  . insulin glargine  15 Units Subcutaneous BID  . multivitamin  1 tablet Oral QHS  . pantoprazole  40 mg Oral Daily   Continuous Infusions: . sodium chloride    . [START ON 10/31/2018] vancomycin    . [START ON 10/28/2018] vancomycin       LOS: 5 days   Marylu Lund, MD [facility] Pager On Amion  If 7PM-7AM, please contact night-coverage 10/27/2018, 4:36 PM

## 2018-10-27 NOTE — Progress Notes (Signed)
This RN and Otila Kluver I.RN had to prime tubing to PCA pump. 2 ml of Dilaudid was used to prime. None came out of tubing. Spoke with pharmacy and was told to prime this way d/t option not highlighted in Alaris pump.   Eleanora Neighbor, RN

## 2018-10-27 NOTE — Progress Notes (Addendum)
[facility] Progress Note   Subjective:  Seen in room. More pain this am. Has Dilaudid PCA  Objective Vitals:   10/27/18 0100 10/27/18 0417 10/27/18 0756 10/27/18 0800  BP:   (!) 173/88   Pulse:   87   Resp: 10 12 18 11   Temp:   98.8 F (37.1 C)   TempSrc:   Oral   SpO2: 98% 98% 98% 97%  Weight:      Height:       Physical Exam General: Alert WNWD NAD  Heart: RRR Lungs: CTAB  Abdomen: soft NT/ND Extremities: s/p L BKA; Right BKA bandaged in stump protector  Dialysis Access: LUE AVF +bruit; New R IJ [facility]   Dialysis Orders:  Center: [facility] TThS EDW 91.5  HD Bath 2k,2ca  Time 4 Heparin 2600. Access R IJ Percath / LUA AVF (s/p revision 09/13/18) Calcitriol 0.5 mcg IV/HD   Mircera 225  Next due 10/24/18  Other  pth 1011 (11107/19) hgb 7.8 10/17/18   Assessment/Plan: 1. Right foot Osteomyelitis - On IV Vanc. S/p R BKA 11/23 per  Dr. [name] 2. ESRD - HD TTS s/p [facility] exchange 11/22. Next HD Monday 11/25 d/t holiday schedule next week.  3. HTN/volume  - BP stable. On Coreg 12.5 bid. No volume on CXR. Not to EDW by weights here.  Anticipate lower EDW s/p BKA. UF as tolerated.  4. Anemia- Hgb 9.2 s/p 2 units prbcs 11/22. Aranesp 200 q Thursday 5. MBD- Ca/Phos ok. On Calcitriol. No binders on med list.  6. Nutrition - Low albumin. Add prostat 7. PAD - Status post revascularization of RLE 09/16/2018 8. DM - insulin per primary   Lynnda Child PA-C [facility] Pager [phone] 10/27/2018,10:03 AM  LOS: 5 days   Pt seen, examined and agree w A/P as above.  [name] MD [facility] pager [phone]   10/27/2018, 2:03 PM    Additional Objective Labs: Basic Metabolic Panel: Recent Labs  Lab 10/23/18 0640 10/24/18 1334 10/25/18 0618 10/26/18 0926  NA 137 136 134* 138  K 3.2* 3.4* 3.7 3.8  CL 99 102 100  --   CO2 26 25 25   --   GLUCOSE 278* 182* 189* 185*  BUN 36* 43* 34*  --   CREATININE 4.58* 4.55* 4.75*   --   CALCIUM 7.8* 7.9* 8.3*  --   PHOS  --  3.8  --   --    CBC: Recent Labs  Lab 10/22/18 1852 10/23/18 0640 10/24/18 0634 10/25/18 0618 10/26/18 0639 10/26/18 0926  WBC 16.2* 11.4* 12.3* 12.0* 12.9*  --   NEUTROABS 13.2*  --   --   --   --   --   HGB 8.2* 7.3* 7.1* 7.0* 8.7* 9.2*  HCT 28.4* 25.6* 25.3* 25.2* 30.2* 27.0*  MCV 77.2* 76.9* 77.6* 76.8* 78.9*  --   PLT 167 163 226 160 134*  --    Blood Culture    Component Value Date/Time   SDES BLOOD LEFT HAND 09/10/2018 0034   SPECREQUEST  09/10/2018 0034    BOTTLES DRAWN AEROBIC ONLY Blood Culture adequate volume   CULT  09/10/2018 0034    NO GROWTH 5 DAYS Performed at [facility], [place] [address], [place], [place] [address]    REPTSTATUS 09/15/2018 FINAL 09/10/2018 0034    Cardiac Enzymes: No results for input(s): CKTOTAL, CKMB, CKMBINDEX, TROPONINI in the last 168 hours. CBG: Recent Labs  Lab 10/26/18 0748 10/26/18 1208  10/26/18 1616 10/26/18 2138 10/27/18 0757  GLUCAP 172* 170* 221* 152* 231*   Iron Studies: No results for input(s): IRON, TIBC, TRANSFERRIN, FERRITIN in the last 72 hours. Lab Results  Component Value Date   INR 1.10 10/25/2018   INR 1.17 03/31/2017   INR 1.09 12/18/2013   Medications: . sodium chloride    . [START ON 10/29/2018] vancomycin     . sodium chloride   Intravenous Once  . ammonium lactate   Topical BID  . aspirin EC  81 mg Oral Daily  . atorvastatin  10 mg Oral QHS  . calcitRIOL  0.5 mcg Oral Q T,Th,Sa-HD  . carvedilol  12.5 mg Oral BID WC  . darbepoetin (ARANESP) injection - DIALYSIS  200 mcg Intravenous Q Thu-HD  . docusate sodium  100 mg Oral BID  . feeding supplement (PRO-STAT SUGAR FREE 64)  30 mL Oral BID  . gabapentin  300 mg Oral QHS  . HYDROmorphone   Intravenous Q4H  . insulin aspart  0-9 Units Subcutaneous TID WC  . insulin glargine  15 Units Subcutaneous BID  . multivitamin  1 tablet Oral QHS  . pantoprazole  40 mg Oral Daily

## 2018-10-27 NOTE — Progress Notes (Signed)
PT Cancellation Note  Patient Details Name: [name] MRN: [id number] DOB: 1961-01-18   Cancelled Treatment:    Reason Eval/Treat Not Completed: Other (comment). Patient politely declining therapy evaluation secondary to 10/10 residual limb pain (has Dilaudid PCA). Discussed potential for CIR but patient refusing, stating, "they are militants and make me wake up at 7 AM." States he will need a new wheelchair. Will follow.  Ellamae Sia, PT, DPT Acute Rehabilitation Services Pager [phone] Office [phone]    [name] 10/27/2018, 2:05 PM

## 2018-10-28 LAB — CBC
HEMATOCRIT: 31 % — AB (ref 39.0–52.0)
HEMOGLOBIN: 8.7 g/dL — AB (ref 13.0–17.0)
MCH: 22.8 pg — AB (ref 26.0–34.0)
MCHC: 28.1 g/dL — AB (ref 30.0–36.0)
MCV: 81.4 fL (ref 80.0–100.0)
Platelets: 224 10*3/uL (ref 150–400)
RBC: 3.81 MIL/uL — ABNORMAL LOW (ref 4.22–5.81)
RDW: 17.6 % — ABNORMAL HIGH (ref 11.5–15.5)
WBC: 11.5 10*3/uL — ABNORMAL HIGH (ref 4.0–10.5)
nRBC: 0 % (ref 0.0–0.2)

## 2018-10-28 LAB — RENAL FUNCTION PANEL
ANION GAP: 9 (ref 5–15)
Albumin: 2.3 g/dL — ABNORMAL LOW (ref 3.5–5.0)
BUN: 52 mg/dL — ABNORMAL HIGH (ref 6–20)
CO2: 25 mmol/L (ref 22–32)
Calcium: 8.1 mg/dL — ABNORMAL LOW (ref 8.9–10.3)
Chloride: 102 mmol/L (ref 98–111)
Creatinine, Ser: 5.27 mg/dL — ABNORMAL HIGH (ref 0.61–1.24)
GFR calc non Af Amer: 11 mL/min — ABNORMAL LOW (ref >60)
GFR, EST AFRICAN AMERICAN: 12 mL/min — AB (ref >60)
GLUCOSE: 166 mg/dL — AB (ref 70–99)
PHOSPHORUS: 5.2 mg/dL — AB (ref 2.5–4.6)
POTASSIUM: 4.6 mmol/L (ref 3.5–5.1)
Sodium: 136 mmol/L (ref 135–145)

## 2018-10-28 LAB — GLUCOSE, CAPILLARY
GLUCOSE-CAPILLARY: 156 mg/dL — AB (ref 70–99)
Glucose-Capillary: 164 mg/dL — ABNORMAL HIGH (ref 70–99)
Glucose-Capillary: 184 mg/dL — ABNORMAL HIGH (ref 70–99)
Glucose-Capillary: 181 mg/dL — ABNORMAL HIGH (ref 70–99)
Glucose-Capillary: 186 mg/dL — ABNORMAL HIGH (ref 70–99)

## 2018-10-28 MED ORDER — CALCITRIOL 0.5 MCG PO CAPS
ORAL_CAPSULE | ORAL | Status: AC
  Administered 2018-10-28: 0.5 ug via ORAL
  Filled 2018-10-28: qty 1

## 2018-10-28 MED ORDER — HYDROMORPHONE HCL 1 MG/ML IJ SOLN
1.0000 mg | INTRAMUSCULAR | Status: DC | PRN
  Administered 2018-10-28 – 2018-10-31 (×24): 1 mg via INTRAVENOUS
  Filled 2018-10-28 (×22): qty 1

## 2018-10-28 MED ORDER — HEPARIN SODIUM (PORCINE) 1000 UNIT/ML IJ SOLN
INTRAMUSCULAR | Status: AC
  Filled 2018-10-28: qty 4

## 2018-10-28 MED ORDER — SODIUM CHLORIDE 0.9 % IV SOLN: 100.0000 mL | INTRAVENOUS | Status: DC | PRN

## 2018-10-28 MED ORDER — HYDROMORPHONE HCL 1 MG/ML IJ SOLN
INTRAMUSCULAR | Status: AC
  Administered 2018-10-28: 1 mg via INTRAVENOUS
  Filled 2018-10-28: qty 1

## 2018-10-28 MED ORDER — ALTEPLASE 2 MG IJ SOLR
INTRAMUSCULAR | Status: AC
  Filled 2018-10-28: qty 4

## 2018-10-28 NOTE — Plan of Care (Signed)
  Problem: Activity: Goal: Ability to perform//tolerate increased activity and mobilize with assistive devices will improve Outcome: Not Progressing   Problem: Pain Management: Goal: Pain level will decrease with appropriate interventions Outcome: Not Progressing

## 2018-10-28 NOTE — Progress Notes (Signed)
Orthopedic Tech Progress Note Patient Details:  [name] [birth year] [id number]  Patient ID: [name], male   DOB: 03-19-1961, 57 y.o.   MRN: [id number]   [name] 10/28/2018, 8:31 AMCalled [facility] for right stump protector.

## 2018-10-28 NOTE — Progress Notes (Signed)
PROGRESS NOTE    [name]  BPZ:[id number] DOB: [birth year] DOA: 10/22/2018 PCP: System, Pcp Not In    Brief Narrative:  57 y.o. male with medical history significant of insulin-dependent diabetes mellitus, left BKA, right metatarsal foot amputation, peripheral neuropathy, hypertension, hyperlipidemia, tobacco abuse ESRD started on dialysis 3 weeks ago, recent history of lower extremity osteomyelitis and recent amputation presenting to the hospital for evaluation of hypotension at dialysis today.  Patient was sent to the ED via EMS from dialysis.  Per ED report, patient completed entire treatment.  When they stood him up to weigh him at dialysis he had a syncopal/near syncopal episode and became hypotensive.  He was given 650 cc bolus prior to arrival.  Patient reports having pain in his right foot which started a few days after his recent hospital discharge.  States some days he is unable to bear weight on that leg.  States he started noticing a few days ago that the bottom of his right foot has been peeling.  Denies having any fevers, chills, nausea, or vomiting.  States his appetite has been reduced since his prior hospitalization.  He did finish a full course of doxycycline after his recent discharge.  Patient was recently admitted from September 09, 2018 to September 20 2018.  He was transitioned to hemodialysis during this hospitalization.  MRI of his right stump revealed early osteomyelitis.  MRSA screening positive.  He was treated with 10 days of IV antibiotics including vancomycin.  Vascular surgery was consulted and patient underwent revascularization of his right lower extremity in September 16, 2018.  Hospital course was complicated by intermittent severe pain in his right stump.  Infectious disease was consulted and recommended doxycycline for 5 days.  ED Course: Hemodynamically stable.  White count 16.2; was 16.8 a month ago and has previously been elevated as well.  Lactic acid  normal.  Chest x-ray showing no acute cardiopulmonary process.  X-ray of right foot showing evidence of soft tissue air in the stump region as well as lateral to the fifth metatarsal remnant; no bony destruction evident.  Patient received 500 cc normal saline bolus, Norco, and vancomycin in the ED.  Assessment & Plan:   Principal Problem:   Right foot pain Active Problems:   HTN (hypertension)   Type 2 diabetes mellitus (HCC)   HLD (hyperlipidemia)   PVD (peripheral vascular disease) (HCC)   Hypokalemia   ESRD (end stage renal disease) (HCC)   Chronic anemia   Ambulatory dysfunction   GERD (gastroesophageal reflux disease)  Right foot (stump) pain, suspect 2/2 infection/ osteomyelitis -Recently admitted for right lower extremity cellulitis with early changes of osteomyelitis on MRI.  MRSA screening was positive.  He was treated with 10 days of IV antibiotics including vancomycin and treated with a 5-day course of doxycycline. He underwent revascularization of his right lower extremity in September 16, 2018.   -Currently afebrile and hemodynamically stable.  White count 16.2; was 16.8 a month ago and has previously been elevated as well.  Lactic acid normal.  X-ray of right foot showing evidence of soft tissue air in the stump region as well as lateral to the fifth metatarsal remnant; no bony destruction evident. -Continued on IV Vancomycin -R foot MRI reviewed with patient and Orthopedic Surgery. Concerns of osteomyelitis noted -Pain management: Poorly controlled pain, no significant improvement with PCA -Continue to wean pain meds as tolerated -Wound care consulted earlier this admit -Pt is now s/p R BKA 11/23 -continue PT per  Orthopedic Surgery  Peripheral vascular disease Status post revascularization of right lower extremity in September 16, 2018. -Continue home Plavix, Lipitor as tolerated -No evidence of acute bleeding  Hypokalemia -Continue to follow electrolytes and correct as  needed  New end-stage renal disease on dialysis TTS -Nephrology consulted and following -Continue HD as tolerated -Vascular Surgery following, placed RIJ tunneled cath 11/22 -Poor HD performance thus re-attempt at HD 11/26  Anemia of chronic disease/iron deficiency anemia -Hemoglobin 8.2 at presentation. Remains hemodynamically stable -Continue to follow, transfuse as needed  Ambulatory dysfunction due to amputation/right foot pain -PT consulted -s/p amputation per above  Hypertension -Continue home Coreg as tolerated -BP stable at this time  Hyperlipidemia -Continue home Lipitor as tolerated, stable  Type 2 diabetes A1c 7.5 on September 10, 2018.  Most recent CBG 329. -Continue home Lantus 15 units twice daily -Sliding scale insulin sensitive -glucose in the mid-150/170's to 200's -Increase lantus to 20 units -will add pre-meal coverage with novolog 3 units  GERD -Continue PPI as tolerated -Currently stable  DVT prophylaxis: Heparin subQ Code Status: Full Family Communication: Pt in room, family not at bedside Disposition Plan: Uncertain at this time  Consultants:   Nephrology  Orthopedic Surgery  Procedures:   R BKA 11/23  Antimicrobials: Anti-infectives (From admission, onward)   Start     Dose/Rate Route Frequency Ordered Stop   10/31/18 1200  vancomycin (VANCOCIN) IVPB 1000 mg/200 mL premix  Status:  Discontinued     1,000 mg 200 mL/hr over 60 Minutes Intravenous Every T-Th-Sa (Hemodialysis) 10/27/18 1430 10/28/18 1026   10/29/18 1200  vancomycin (VANCOCIN) IVPB 1000 mg/200 mL premix  Status:  Discontinued     1,000 mg 200 mL/hr over 60 Minutes Intravenous Every T-Th-Sa (Hemodialysis) 10/25/18 1300 10/27/18 1430   10/28/18 1200  vancomycin (VANCOCIN) IVPB 1000 mg/200 mL premix  Status:  Discontinued     1,000 mg 200 mL/hr over 60 Minutes Intravenous  Once 10/27/18 1430 10/28/18 1026   10/26/18 1120  vancomycin (VANCOCIN) powder  Status:   Discontinued       As needed 10/26/18 1123 10/26/18 1203   10/25/18 1400  vancomycin (VANCOCIN) IVPB 1000 mg/200 mL premix     1,000 mg 200 mL/hr over 60 Minutes Intravenous Every M-W-F (Hemodialysis) 10/25/18 1259 10/25/18 1736   10/25/18 0700  ceFAZolin (ANCEF) IVPB 2g/100 mL premix     2 g 200 mL/hr over 30 Minutes Intravenous On call 10/24/18 1508 10/25/18 0744   10/24/18 1200  vancomycin (VANCOCIN) IVPB 1000 mg/200 mL premix  Status:  Discontinued     1,000 mg 200 mL/hr over 60 Minutes Intravenous Every T-Th-Sa (Hemodialysis) 10/22/18 2223 10/25/18 1300   10/22/18 2215  vancomycin (VANCOCIN) 2,000 mg in sodium chloride 0.9 % 500 mL IVPB     2,000 mg 250 mL/hr over 120 Minutes Intravenous  Once 10/22/18 2209 10/23/18 0131      Subjective: Complaining of continued RLE pain  Objective: Vitals:   10/28/18 0900 10/28/18 1055 10/28/18 1148 10/28/18 1704  BP: (!) 135/93 108/60 (!) 143/82 140/73  Pulse: 82 84 87 79  Resp:  16 18 19   Temp:  99.3 F (37.4 C) 98.7 F (37.1 C) 98.3 F (36.8 C)  TempSrc:  Oral Oral Oral  SpO2:   100% 99%  Weight:      Height:        Intake/Output Summary (Last 24 hours) at 10/28/2018 1724 Last data filed at 10/28/2018 1055 Gross per 24 hour  Intake 120  ml  Output 1384 ml  Net -1264 ml   Filed Weights   10/26/18 2134 10/27/18 2116 10/28/18 0755  Weight: 94.3 kg 94.2 kg 93.6 kg    Examination: General exam: Awake, laying in bed, in nad Respiratory system: Normal respiratory effort, no wheezing Cardiovascular system: regular rate, s1, s2  Data Reviewed: I have personally reviewed following labs and imaging studies  CBC: Recent Labs  Lab 10/22/18 1852 10/23/18 0640 10/24/18 0634 10/25/18 0618 10/26/18 0639 10/26/18 0926 10/28/18 0830  WBC 16.2* 11.4* 12.3* 12.0* 12.9*  --  11.5*  NEUTROABS 13.2*  --   --   --   --   --   --   HGB 8.2* 7.3* 7.1* 7.0* 8.7* 9.2* 8.7*  HCT 28.4* 25.6* 25.3* 25.2* 30.2* 27.0* 31.0*  MCV 77.2* 76.9*  77.6* 76.8* 78.9*  --  81.4  PLT 167 163 226 160 134*  --  161   Basic Metabolic Panel: Recent Labs  Lab 10/22/18 1852 10/23/18 0640 10/24/18 1334 10/25/18 0618 10/26/18 0926 10/28/18 0830  NA 137 137 136 134* 138 136  K 3.0* 3.2* 3.4* 3.7 3.8 4.6  CL 98 99 102 100  --  102  CO2 27 26 25 25   --  25  GLUCOSE 220* 278* 182* 189* 185* 166*  BUN 26* 36* 43* 34*  --  52*  CREATININE 3.48* 4.58* 4.55* 4.75*  --  5.27*  CALCIUM 7.9* 7.8* 7.9* 8.3*  --  8.1*  MG  --  1.8  --   --   --   --   PHOS  --   --  3.8  --   --  5.2*   GFR: Estimated Creatinine Clearance: 17.9 mL/min (A) (by C-G formula based on SCr of 5.27 mg/dL (H)). Liver Function Tests: Recent Labs  Lab 10/24/18 1334 10/28/18 0830  ALBUMIN 2.2* 2.3*   No results for input(s): LIPASE, AMYLASE in the last 168 hours. No results for input(s): AMMONIA in the last 168 hours. Coagulation Profile: Recent Labs  Lab 10/25/18 0618  INR 1.10   Cardiac Enzymes: No results for input(s): CKTOTAL, CKMB, CKMBINDEX, TROPONINI in the last 168 hours. BNP (last 3 results) No results for input(s): PROBNP in the last 8760 hours. HbA1C: No results for input(s): HGBA1C in the last 72 hours. CBG: Recent Labs  Lab 10/27/18 1644 10/28/18 0747 10/28/18 0822 10/28/18 1146 10/28/18 1703  GLUCAP 155* 184* 164* 181* 186*   Lipid Profile: No results for input(s): CHOL, HDL, LDLCALC, TRIG, CHOLHDL, LDLDIRECT in the last 72 hours. Thyroid Function Tests: No results for input(s): TSH, T4TOTAL, FREET4, T3FREE, THYROIDAB in the last 72 hours. Anemia Panel: No results for input(s): VITAMINB12, FOLATE, FERRITIN, TIBC, IRON, RETICCTPCT in the last 72 hours. Sepsis Labs: Recent Labs  Lab 10/22/18 1906  LATICACIDVEN 1.84    Recent Results (from the past 240 hour(s))  MRSA PCR Screening     Status: None   Collection Time: 10/23/18  9:47 AM  Result Value Ref Range Status   MRSA by PCR NEGATIVE NEGATIVE Final    Comment:        The  GeneXpert MRSA Assay (FDA approved for NASAL specimens only), is one component of a comprehensive MRSA colonization surveillance program. It is not intended to diagnose MRSA infection nor to guide or monitor treatment for MRSA infections. Performed at [facility], [place] [address], [place], [place] [address]   Surgical pcr screen     Status: None  Collection Time: 10/25/18  5:44 AM  Result Value Ref Range Status   MRSA, PCR NEGATIVE NEGATIVE Final   Staphylococcus aureus NEGATIVE NEGATIVE Final    Comment: (NOTE) The Xpert SA Assay (FDA approved for NASAL specimens in patients 59 years of age and older), is one component of a comprehensive surveillance program. It is not intended to diagnose infection nor to guide or monitor treatment. Performed at [facility], [place] [address] [place], [place] [address]      Radiology Studies: No results found.  Scheduled Meds: . sodium chloride   Intravenous Once  . alteplase      . ammonium lactate   Topical BID  . aspirin EC  81 mg Oral Daily  . atorvastatin  10 mg Oral QHS  . calcitRIOL  0.5 mcg Oral Q T,Th,Sa-HD  . carvedilol  12.5 mg Oral BID WC  . Chlorhexidine Gluconate Cloth  6 each Topical Q0600  . darbepoetin (ARANESP) injection - DIALYSIS  200 mcg Intravenous Q Thu-HD  . docusate sodium  100 mg Oral BID  . feeding supplement (PRO-STAT SUGAR FREE 64)  30 mL Oral BID  . gabapentin  300 mg Oral QHS  . heparin injection (subcutaneous)  5,000 Units Subcutaneous Q8H  . insulin aspart  0-9 Units Subcutaneous TID WC  . insulin aspart  3 Units Subcutaneous TID WC  . insulin glargine  20 Units Subcutaneous BID  . multivitamin  1 tablet Oral QHS  . pantoprazole  40 mg Oral Daily   Continuous Infusions: . sodium chloride       LOS: 6 days   Marylu Lund, MD [facility] Pager On Amion  If 7PM-7AM, please contact night-coverage 10/28/2018, 5:24 PM

## 2018-10-28 NOTE — Progress Notes (Addendum)
PT Cancellation Note  Patient Details Name: [name] MRN: [id number] DOB: Apr 01, 1961   Cancelled Treatment:    Reason Eval/Treat Not Completed: Patient at procedure or test/unavailable. Pt currently in HD. Will continue to follow and initiate PT re-evaluation when pt is available and agreeable.   Thelma Comp 10/28/2018, 10:28 AM   Rolinda Roan, PT, DPT Acute Rehabilitation Services Pager: [phone] Office: [phone]

## 2018-10-28 NOTE — Progress Notes (Signed)
Subjective: 2 Days Post-Op Procedure(s) (LRB): AMPUTATION BELOW KNEE (Right)  Patient reports pain as moderate to severe.  Reports that pain wakes him at night.  Tolerating POs well.  Admits to flatus.  Denies fever, chills, N/V, SOB, CP.  States that he has dialysis today. Objective:   VITALS:  Temp:  [98.1 F (36.7 C)-98.8 F (37.1 C)] 98.6 F (37 C) (11/24 2116) Pulse Rate:  [76-87] 84 (11/25 0427) Resp:  [10-20] 20 (11/25 0427) BP: (132-173)/(78-88) 169/84 (11/25 0427) SpO2:  [92 %-100 %] 100 % (11/25 0427) Weight:  [94.2 kg] 94.2 kg (11/24 2116)  General: WDWN patient in NAD. Psych:  Appropriate mood and affect. Neuro:  A&O x 3, Moving all extremities, sensation intact to light touch HEENT:  EOMs intact Chest:  Even non-labored respirations Skin:  Dressing/KI C/D/I, no rashes or lesions Extremities: warm/dry, mild edema, no erythema or echymosis.  No lymphadenopathy. Pulses: Femoral 2+ MSK:  ROM: TKE, MMT: able to perform quad set    LABS Recent Labs    10/26/18 0639 10/26/18 0926  HGB 8.7* 9.2*  WBC 12.9*  --   PLT 134*  --    Recent Labs    10/26/18 0926  NA 138  K 3.8  GLUCOSE 185*   No results for input(s): LABPT, INR in the last 72 hours.   Assessment/Plan: 2 Days Post-Op Procedure(s) (LRB): AMPUTATION BELOW KNEE (Right)  NWB R LE Up with therapy Stump protector from [employer] ordered. Knee immobilizer or Stump protector on at all times Plan for 2 week outpatient post-op visit with Dr. Annia Friendly [facility] Office:  [phone]

## 2018-10-28 NOTE — Progress Notes (Addendum)
[facility] Progress Note   Subjective: Seen in room, visitors at bedside. HD stopped early D/T clotted TDC. K+ 4.6.  Still C/O pain in R BKA.  Objective Vitals:   10/28/18 0830 10/28/18 0900 10/28/18 1055 10/28/18 1148  BP: (!) 88/70 (!) 135/93 108/60 (!) 143/82  Pulse: 83 82 84 87  Resp:   16 18  Temp:   99.3 F (37.4 C) 98.7 F (37.1 C)  TempSrc:   Oral Oral  SpO2:    100%  Weight:      Height:       Physical Exam General: WN,WD, NAD Heart: S1,S2 RRR Lungs: CTAB Abdomen: active BS  Extremities: L BKA no stump edema. R BKA in stump protector.  Dialysis Access: RIJ TDC drsg CDI, Maturing LUA AVF +T/B   Additional Objective Labs: Basic Metabolic Panel: Recent Labs  Lab 10/24/18 1334 10/25/18 0618 10/26/18 0926 10/28/18 0830  NA 136 134* 138 136  K 3.4* 3.7 3.8 4.6  CL 102 100  --  102  CO2 25 25  --  25  GLUCOSE 182* 189* 185* 166*  BUN 43* 34*  --  52*  CREATININE 4.55* 4.75*  --  5.27*  CALCIUM 7.9* 8.3*  --  8.1*  PHOS 3.8  --   --  5.2*   Liver Function Tests: Recent Labs  Lab 10/24/18 1334 10/28/18 0830  ALBUMIN 2.2* 2.3*   No results for input(s): LIPASE, AMYLASE in the last 168 hours. CBC: Recent Labs  Lab 10/22/18 1852 10/23/18 0640 10/24/18 5188 10/25/18 0618 10/26/18 0639 10/26/18 0926 10/28/18 0830  WBC 16.2* 11.4* 12.3* 12.0* 12.9*  --  11.5*  NEUTROABS 13.2*  --   --   --   --   --   --   HGB 8.2* 7.3* 7.1* 7.0* 8.7* 9.2* 8.7*  HCT 28.4* 25.6* 25.3* 25.2* 30.2* 27.0* 31.0*  MCV 77.2* 76.9* 77.6* 76.8* 78.9*  --  81.4  PLT 167 163 226 160 134*  --  224   Blood Culture    Component Value Date/Time   SDES BLOOD LEFT HAND 09/10/2018 0034   SPECREQUEST  09/10/2018 0034    BOTTLES DRAWN AEROBIC ONLY Blood Culture adequate volume   CULT  09/10/2018 0034    NO GROWTH 5 DAYS Performed at [facility], [place] [address], [place], [place] [address]    REPTSTATUS 09/15/2018 FINAL 09/10/2018 0034    Cardiac  Enzymes: No results for input(s): CKTOTAL, CKMB, CKMBINDEX, TROPONINI in the last 168 hours. CBG: Recent Labs  Lab 10/27/18 1134 10/27/18 1644 10/28/18 0747 10/28/18 0822 10/28/18 1146  GLUCAP 219* 155* 184* 164* 181*   Iron Studies: No results for input(s): IRON, TIBC, TRANSFERRIN, FERRITIN in the last 72 hours. @lablastinr3 @ Studies/Results: No results found. Medications: . sodium chloride     . sodium chloride   Intravenous Once  . alteplase      . ammonium lactate   Topical BID  . aspirin EC  81 mg Oral Daily  . atorvastatin  10 mg Oral QHS  . calcitRIOL  0.5 mcg Oral Q T,Th,Sa-HD  . carvedilol  12.5 mg Oral BID WC  . Chlorhexidine Gluconate Cloth  6 each Topical Q0600  . darbepoetin (ARANESP) injection - DIALYSIS  200 mcg Intravenous Q Thu-HD  . docusate sodium  100 mg Oral BID  . feeding supplement (PRO-STAT SUGAR FREE 64)  30 mL Oral BID  . gabapentin  300 mg Oral QHS  . heparin injection (  subcutaneous)  5,000 Units Subcutaneous Q8H  . insulin aspart  0-9 Units Subcutaneous TID WC  . insulin aspart  3 Units Subcutaneous TID WC  . insulin glargine  20 Units Subcutaneous BID  . multivitamin  1 tablet Oral QHS  . pantoprazole  40 mg Oral Daily     Dialysis Orders:  Center:[facility] 4 hrs 15 min 180NRe 400/Autoflow 2.0 91.5 kg 2.0 K/ 2.0 Ca  RIJ TDC/Maturing L AVF  -Calcitriol 0.63mcg PO TIW -Mircera 225 mcg IV q 2 weeksNext due 10/24/18  Assessment/Plan: 1. Right foot Osteomyelitis - On IV Vanc. S/p R BKA 11/23 per  Dr. [name] 2. ESRD - HD TTS s/p [facility] exchange 11/22. Unable to get blood return TDC. Packed with cath flo overnight. Attempt HD tomorrow. If [facility] fails again, will need to replace.  3. HTN/volume  - BP stable. On Coreg 12.5 bid. No volume on CXR. Not to EDW by weights here.  Anticipate lower EDW s/p BKA. UF as tolerated.  4. Anemia- Hgb 8.7 s/p 2 units prbcs 11/22. Aranesp 200 q Thursday 5. MBD- Ca/Phos ok. On Calcitriol. No binders on med  list.  6. Nutrition - Albumin 2.3. Add prostat 7. PAD - Status post revascularization of RLE 09/16/2018 8. DM - insulin per primary   Rita H. Brown NP-C 10/28/2018, 12:39 PM  [facility] [phone]  Pt seen, examined and agree w A/P as above.  [name] MD [employer] pager [phone]   10/28/2018, 2:46 PM

## 2018-10-28 NOTE — Progress Notes (Signed)
Orthopedic Tech Progress Note Patient Details:  LINDBERGH WINKLES 1961-02-01 [id number]  Patient ID: [name], male   DOB: [birth year], 59 y.o.   MRN: [id number]   [name] 10/28/2018, 9:33 AM Called Outside vendor for right stump protector.

## 2018-10-29 LAB — RENAL FUNCTION PANEL
Albumin: 2.2 g/dL — ABNORMAL LOW (ref 3.5–5.0)
Anion gap: 11 (ref 5–15)
BUN: 60 mg/dL — ABNORMAL HIGH (ref 6–20)
CO2: 24 mmol/L (ref 22–32)
Calcium: 8.2 mg/dL — ABNORMAL LOW (ref 8.9–10.3)
Chloride: 101 mmol/L (ref 98–111)
Creatinine, Ser: 6.02 mg/dL — ABNORMAL HIGH (ref 0.61–1.24)
GFR calc Af Amer: 11 mL/min — ABNORMAL LOW (ref >60)
GFR calc non Af Amer: 9 mL/min — ABNORMAL LOW (ref >60)
Glucose, Bld: 184 mg/dL — ABNORMAL HIGH (ref 70–99)
Phosphorus: 6.8 mg/dL — ABNORMAL HIGH (ref 2.5–4.6)
Potassium: 4.7 mmol/L (ref 3.5–5.1)
Sodium: 136 mmol/L (ref 135–145)

## 2018-10-29 LAB — CBC
HCT: 29.5 % — ABNORMAL LOW (ref 39.0–52.0)
Hemoglobin: 8.3 g/dL — ABNORMAL LOW (ref 13.0–17.0)
MCH: 22.7 pg — ABNORMAL LOW (ref 26.0–34.0)
MCHC: 28.1 g/dL — ABNORMAL LOW (ref 30.0–36.0)
MCV: 80.6 fL (ref 80.0–100.0)
Platelets: 278 10*3/uL (ref 150–400)
RBC: 3.66 MIL/uL — ABNORMAL LOW (ref 4.22–5.81)
RDW: 18 % — ABNORMAL HIGH (ref 11.5–15.5)
WBC: 13.1 10*3/uL — ABNORMAL HIGH (ref 4.0–10.5)
nRBC: 0 % (ref 0.0–0.2)

## 2018-10-29 LAB — GLUCOSE, CAPILLARY
GLUCOSE-CAPILLARY: 142 mg/dL — AB (ref 70–99)
Glucose-Capillary: 181 mg/dL — ABNORMAL HIGH (ref 70–99)
Glucose-Capillary: 131 mg/dL — ABNORMAL HIGH (ref 70–99)

## 2018-10-29 MED ORDER — SODIUM CHLORIDE 0.9 % IV SOLN: 100.0000 mL | INTRAVENOUS | Status: DC | PRN

## 2018-10-29 MED ORDER — HYDROMORPHONE HCL 1 MG/ML IJ SOLN
INTRAMUSCULAR | Status: AC
  Filled 2018-10-29: qty 1

## 2018-10-29 MED ORDER — SEVELAMER CARBONATE 800 MG PO TABS
800.0000 mg | ORAL_TABLET | Freq: Three times a day (TID) | ORAL | Status: DC
  Administered 2018-10-29 – 2018-11-01 (×9): 800 mg via ORAL
  Filled 2018-10-29 (×9): qty 1

## 2018-10-29 MED ORDER — CEFAZOLIN SODIUM-DEXTROSE 2-4 GM/100ML-% IV SOLN
2.0000 g | INTRAVENOUS | Status: AC
  Filled 2018-10-29: qty 100

## 2018-10-29 MED ORDER — HEPARIN SODIUM (PORCINE) 1000 UNIT/ML IJ SOLN
INTRAMUSCULAR | Status: AC
  Administered 2018-10-29: 1000 [IU] via INTRAVENOUS_CENTRAL
  Filled 2018-10-29: qty 4

## 2018-10-29 MED ORDER — HYDROCODONE-ACETAMINOPHEN 5-325 MG PO TABS
ORAL_TABLET | ORAL | Status: AC
  Filled 2018-10-29: qty 1

## 2018-10-29 NOTE — Progress Notes (Signed)
PROGRESS NOTE    [name]  JQZ:[id number] DOB: 03/09/1961 DOA: 10/22/2018 PCP: System, Pcp Not In    Brief Narrative:  57 y.o. male with medical history significant of insulin-dependent diabetes mellitus, left BKA, right metatarsal foot amputation, peripheral neuropathy, hypertension, hyperlipidemia, tobacco abuse ESRD started on dialysis 3 weeks ago, recent history of lower extremity osteomyelitis and recent amputation presenting to the hospital for evaluation of hypotension at dialysis today.  Patient was sent to the ED via EMS from dialysis.  Per ED report, patient completed entire treatment.  When they stood him up to weigh him at dialysis he had a syncopal/near syncopal episode and became hypotensive.  He was given 650 cc bolus prior to arrival.  Patient reports having pain in his right foot which started a few days after his recent hospital discharge.  States some days he is unable to bear weight on that leg.  States he started noticing a few days ago that the bottom of his right foot has been peeling.  Denies having any fevers, chills, nausea, or vomiting.  States his appetite has been reduced since his prior hospitalization.  He did finish a full course of doxycycline after his recent discharge.  Patient was recently admitted from September 09, 2018 to September 20 2018.  He was transitioned to hemodialysis during this hospitalization.  MRI of his right stump revealed early osteomyelitis.  MRSA screening positive.  He was treated with 10 days of IV antibiotics including vancomycin.  Vascular surgery was consulted and patient underwent revascularization of his right lower extremity in September 16, 2018.  Hospital course was complicated by intermittent severe pain in his right stump.  Infectious disease was consulted and recommended doxycycline for 5 days.  ED Course: Hemodynamically stable.  White count 16.2; was 16.8 a month ago and has previously been elevated as well.  Lactic acid  normal.  Chest x-ray showing no acute cardiopulmonary process.  X-ray of right foot showing evidence of soft tissue air in the stump region as well as lateral to the fifth metatarsal remnant; no bony destruction evident.  Patient received 500 cc normal saline bolus, Norco, and vancomycin in the ED.  Assessment & Plan:   Principal Problem:   Right foot pain Active Problems:   HTN (hypertension)   Type 2 diabetes mellitus (HCC)   HLD (hyperlipidemia)   PVD (peripheral vascular disease) (HCC)   Hypokalemia   ESRD (end stage renal disease) (HCC)   Chronic anemia   Ambulatory dysfunction   GERD (gastroesophageal reflux disease)  Right foot (stump) pain, suspect 2/2 infection/ osteomyelitis -Recently admitted for right lower extremity cellulitis with early changes of osteomyelitis on MRI.  MRSA screening was positive.  He was treated with 10 days of IV antibiotics including vancomycin and treated with a 5-day course of doxycycline. He underwent revascularization of his right lower extremity in September 16, 2018.   -Currently afebrile and hemodynamically stable.  White count 16.2; was 16.8 a month ago and has previously been elevated as well.  Lactic acid normal.  X-ray of right foot showing evidence of soft tissue air in the stump region as well as lateral to the fifth metatarsal remnant; no bony destruction evident. -Continued on IV Vancomycin -R foot MRI reviewed with patient and Orthopedic Surgery. Concerns of osteomyelitis noted -Pain management: Poorly controlled pain, no significant improvement with PCA -Continue to wean pain meds as tolerated -Wound care consulted earlier this admit -Pt is now s/p R BKA 11/23 -continue PT per  Orthopedic Surgery  Peripheral vascular disease Status post revascularization of right lower extremity in September 16, 2018. -Continue home Plavix, Lipitor as tolerated -No evidence of acute bleeding  Hypokalemia -Continue to follow electrolytes and correct as  needed  New end-stage renal disease on dialysis TTS -Nephrology consulted and following -Continue HD as tolerated -Vascular Surgery following, placed RIJ tunneled cath 11/22 -Poorly functioning cath noted. Vascular Surgery continues to follow, plan placement of new cath 11/27  Anemia of chronic disease/iron deficiency anemia -Hemoglobin 8.2 at presentation. Remains hemodynamically stable -Continue to follow, transfuse as needed  Ambulatory dysfunction due to amputation/right foot pain -PT consulted -s/p amputation per above  Hypertension -Continue home Coreg as tolerated -BP stable at this time  Hyperlipidemia -Continue home Lipitor as tolerated, stable  Type 2 diabetes A1c 7.5 on September 10, 2018.  Most recent CBG 329. -Continue home Lantus 15 units twice daily -Sliding scale insulin sensitive -glucose in the mid-150/170's to 200's -Increase lantus to 20 units -will cont pre-meal coverage with novolog 3 units  GERD -Continue PPI as tolerated -Currently stable  DVT prophylaxis: Heparin subQ Code Status: Full Family Communication: Pt in room, family not at bedside Disposition Plan: Uncertain at this time  Consultants:   Nephrology  Orthopedic Surgery  Vascular Surgery  Procedures:   R BKA 11/23  Antimicrobials: Anti-infectives (From admission, onward)   Start     Dose/Rate Route Frequency Ordered Stop   10/31/18 1200  vancomycin (VANCOCIN) IVPB 1000 mg/200 mL premix  Status:  Discontinued     1,000 mg 200 mL/hr over 60 Minutes Intravenous Every T-Th-Sa (Hemodialysis) 10/27/18 1430 10/28/18 1026   10/30/18 0600  ceFAZolin (ANCEF) IVPB 2g/100 mL premix    Note to Pharmacy:  Send with pt to OR   2 g 200 mL/hr over 30 Minutes Intravenous On call 10/29/18 1538 10/31/18 0600   10/29/18 1200  vancomycin (VANCOCIN) IVPB 1000 mg/200 mL premix  Status:  Discontinued     1,000 mg 200 mL/hr over 60 Minutes Intravenous Every T-Th-Sa (Hemodialysis) 10/25/18  1300 10/27/18 1430   10/28/18 1200  vancomycin (VANCOCIN) IVPB 1000 mg/200 mL premix  Status:  Discontinued     1,000 mg 200 mL/hr over 60 Minutes Intravenous  Once 10/27/18 1430 10/28/18 1026   10/26/18 1120  vancomycin (VANCOCIN) powder  Status:  Discontinued       As needed 10/26/18 1123 10/26/18 1203   10/25/18 1400  vancomycin (VANCOCIN) IVPB 1000 mg/200 mL premix     1,000 mg 200 mL/hr over 60 Minutes Intravenous Every M-W-F (Hemodialysis) 10/25/18 1259 10/25/18 1736   10/25/18 0700  ceFAZolin (ANCEF) IVPB 2g/100 mL premix     2 g 200 mL/hr over 30 Minutes Intravenous On call 10/24/18 1508 10/25/18 0744   10/24/18 1200  vancomycin (VANCOCIN) IVPB 1000 mg/200 mL premix  Status:  Discontinued     1,000 mg 200 mL/hr over 60 Minutes Intravenous Every T-Th-Sa (Hemodialysis) 10/22/18 2223 10/25/18 1300   10/22/18 2215  vancomycin (VANCOCIN) 2,000 mg in sodium chloride 0.9 % 500 mL IVPB     2,000 mg 250 mL/hr over 120 Minutes Intravenous  Once 10/22/18 2209 10/23/18 0131      Subjective: Complaining of stump pain  Objective: Vitals:   10/29/18 1130 10/29/18 1201 10/29/18 1314 10/29/18 1625  BP: (!) 116/57 126/72 138/75 116/65  Pulse: 81 80 79 79  Resp:  18 18 19   Temp:  98.4 F (36.9 C) 98.6 F (37 C) 98.5 F (36.9 C)  TempSrc:   Oral Oral  SpO2:  97% 99% 100%  Weight:  89.9 kg    Height:        Intake/Output Summary (Last 24 hours) at 10/29/2018 1712 Last data filed at 10/29/2018 1314 Gross per 24 hour  Intake 320 ml  Output 2500 ml  Net -2180 ml   Filed Weights   10/28/18 0755 10/29/18 0720 10/29/18 1201  Weight: 93.6 kg 92.5 kg 89.9 kg    Examination: General exam: Conversant, in no acute distress Central nervous system: No seizures, no tremors Extremities: No cyanosis, no joint deformities, s/p BLE amputation  Data Reviewed: I have personally reviewed following labs and imaging studies  CBC: Recent Labs  Lab 10/22/18 1852  10/24/18 0634 10/25/18 0618  10/26/18 0639 10/26/18 0926 10/28/18 0830 10/29/18 0800  WBC 16.2*   < > 12.3* 12.0* 12.9*  --  11.5* 13.1*  NEUTROABS 13.2*  --   --   --   --   --   --   --   HGB 8.2*   < > 7.1* 7.0* 8.7* 9.2* 8.7* 8.3*  HCT 28.4*   < > 25.3* 25.2* 30.2* 27.0* 31.0* 29.5*  MCV 77.2*   < > 77.6* 76.8* 78.9*  --  81.4 80.6  PLT 167   < > 226 160 134*  --  224 278   < > = values in this interval not displayed.   Basic Metabolic Panel: Recent Labs  Lab 10/23/18 0640 10/24/18 1334 10/25/18 0618 10/26/18 0926 10/28/18 0830 10/29/18 0759  NA 137 136 134* 138 136 136  K 3.2* 3.4* 3.7 3.8 4.6 4.7  CL 99 102 100  --  102 101  CO2 26 25 25   --  25 24  GLUCOSE 278* 182* 189* 185* 166* 184*  BUN 36* 43* 34*  --  52* 60*  CREATININE 4.58* 4.55* 4.75*  --  5.27* 6.02*  CALCIUM 7.8* 7.9* 8.3*  --  8.1* 8.2*  MG 1.8  --   --   --   --   --   PHOS  --  3.8  --   --  5.2* 6.8*   GFR: Estimated Creatinine Clearance: 14.5 mL/min (A) (by C-G formula based on SCr of 6.02 mg/dL (H)). Liver Function Tests: Recent Labs  Lab 10/24/18 1334 10/28/18 0830 10/29/18 0759  ALBUMIN 2.2* 2.3* 2.2*   No results for input(s): LIPASE, AMYLASE in the last 168 hours. No results for input(s): AMMONIA in the last 168 hours. Coagulation Profile: Recent Labs  Lab 10/25/18 0618  INR 1.10   Cardiac Enzymes: No results for input(s): CKTOTAL, CKMB, CKMBINDEX, TROPONINI in the last 168 hours. BNP (last 3 results) No results for input(s): PROBNP in the last 8760 hours. HbA1C: No results for input(s): HGBA1C in the last 72 hours. CBG: Recent Labs  Lab 10/28/18 1146 10/28/18 1703 10/28/18 2028 10/29/18 1313 10/29/18 1623  GLUCAP 181* 186* 156* 142* 181*   Lipid Profile: No results for input(s): CHOL, HDL, LDLCALC, TRIG, CHOLHDL, LDLDIRECT in the last 72 hours. Thyroid Function Tests: No results for input(s): TSH, T4TOTAL, FREET4, T3FREE, THYROIDAB in the last 72 hours. Anemia Panel: No results for input(s):  VITAMINB12, FOLATE, FERRITIN, TIBC, IRON, RETICCTPCT in the last 72 hours. Sepsis Labs: Recent Labs  Lab 10/22/18 1906  LATICACIDVEN 1.84    Recent Results (from the past 240 hour(s))  MRSA PCR Screening     Status: None   Collection Time: 10/23/18  9:47 AM  Result Value Ref Range Status   MRSA by PCR NEGATIVE NEGATIVE Final    Comment:        The GeneXpert MRSA Assay (FDA approved for NASAL specimens only), is one component of a comprehensive MRSA colonization surveillance program. It is not intended to diagnose MRSA infection nor to guide or monitor treatment for MRSA infections. Performed at [facility], [place] [address], [place], [place] [address]   Surgical pcr screen     Status: None   Collection Time: 10/25/18  5:44 AM  Result Value Ref Range Status   MRSA, PCR NEGATIVE NEGATIVE Final   Staphylococcus aureus NEGATIVE NEGATIVE Final    Comment: (NOTE) The Xpert SA Assay (FDA approved for NASAL specimens in patients 70 years of age and older), is one component of a comprehensive surveillance program. It is not intended to diagnose infection nor to guide or monitor treatment. Performed at [facility], [place] [address] [place], [place] [address]      Radiology Studies: No results found.  Scheduled Meds: . sodium chloride   Intravenous Once  . ammonium lactate   Topical BID  . aspirin EC  81 mg Oral Daily  . atorvastatin  10 mg Oral QHS  . calcitRIOL  0.5 mcg Oral Q T,Th,Sa-HD  . carvedilol  12.5 mg Oral BID WC  . Chlorhexidine Gluconate Cloth  6 each Topical Q0600  . darbepoetin (ARANESP) injection - DIALYSIS  200 mcg Intravenous Q Thu-HD  . docusate sodium  100 mg Oral BID  . feeding supplement (PRO-STAT SUGAR FREE 64)  30 mL Oral BID  . gabapentin  300 mg Oral QHS  . heparin injection (subcutaneous)  5,000 Units Subcutaneous Q8H  . insulin aspart  0-9 Units Subcutaneous TID WC  . insulin aspart  3 Units Subcutaneous TID WC  . insulin glargine   20 Units Subcutaneous BID  . multivitamin  1 tablet Oral QHS  . pantoprazole  40 mg Oral Daily  . sevelamer carbonate  800 mg Oral TID WC   Continuous Infusions: . sodium chloride    . [START ON 10/30/2018]  ceFAZolin (ANCEF) IV       LOS: 7 days   Marylu Lund, MD [facility] Pager On Amion  If 7PM-7AM, please contact night-coverage 10/29/2018, 5:12 PM

## 2018-10-29 NOTE — Anesthesia Preprocedure Evaluation (Addendum)
Anesthesia Evaluation  Patient identified by MRN, date of birth, ID band Patient awake    Reviewed: Allergy & Precautions, H&P , NPO status , Patient's Chart, lab work & pertinent test results, reviewed documented beta blocker date and time   Airway Mallampati: II  TM Distance: >3 FB Neck ROM: Full    Dental no notable dental hx. (+) Partial Upper, Partial Lower, Dental Advisory Given   Pulmonary neg pulmonary ROS, former smoker,    Pulmonary exam normal breath sounds clear to auscultation       Cardiovascular Exercise Tolerance: Good hypertension, Pt. on medications and Pt. on home beta blockers + Peripheral Vascular Disease   Rhythm:Regular Rate:Normal     Neuro/Psych negative neurological ROS  negative psych ROS   GI/Hepatic Neg liver ROS, GERD  Medicated and Controlled,  Endo/Other  diabetes, Insulin Dependent  Renal/GU ESRF and DialysisRenal disease  negative genitourinary   Musculoskeletal   Abdominal   Peds  Hematology  (+) Blood dyscrasia, anemia ,   Anesthesia Other Findings   Reproductive/Obstetrics negative OB ROS                            Anesthesia Physical Anesthesia Plan  ASA: III  Anesthesia Plan: MAC   Post-op Pain Management:    Induction: Intravenous  PONV Risk Score and Plan: 2 and Propofol infusion, Midazolam and Ondansetron  Airway Management Planned: Nasal Cannula  Additional Equipment:   Intra-op Plan:   Post-operative Plan:   Informed Consent: I have reviewed the patients History and Physical, chart, labs and discussed the procedure including the risks, benefits and alternatives for the proposed anesthesia with the patient or authorized representative who has indicated his/her understanding and acceptance.   Dental advisory given  Plan Discussed with: CRNA, Surgeon and Anesthesiologist  Anesthesia Plan Comments:        Anesthesia Quick  Evaluation

## 2018-10-29 NOTE — Progress Notes (Addendum)
PT Cancellation Note  Patient Details Name: PERRIN GENS MRN: [id number] DOB: 11-27-1961   Cancelled Treatment:    Reason Eval/Treat Not Completed: Patient at procedure or test/unavailable. Pt currently in HD. Will continue to follow and initiate re-evaluation when pt is available.   Thelma Comp 10/29/2018, 12:42 PM   Rolinda Roan, PT, DPT Acute Rehabilitation Services Pager: [phone] Office: [phone]

## 2018-10-29 NOTE — Consult Note (Signed)
   Patient name: [name] MRN: [id number] DOB: [birth year] Sex: male  REASON FOR CONSULT:   Poorly functioning left IJ tunneled dialysis catheter.  HPI:   [name] is a pleasant 57 y.o. male wife placed a right IJ 23 cm tunneled dialysis catheter on and worked on his left brachiocephalic fistula on 46/56/8127.  On 10/25/2018, this catheter was replaced because it was not functioning well.  He was unable to do his dialysis treatment today or yesterday because the catheter was not flowing adequately.  We have been asked to change his catheter.   He just returned from dialysis and ate at 3 PM.  REVIEW OF SYSTEMS:  [X]  denotes positive finding, [ ]  denotes negative finding Vascular    Leg swelling    Cardiac    Chest pain or chest pressure:    Shortness of breath upon exertion:    Short of breath when lying flat:    Irregular heart rhythm:    Constitutional    Fever or chills:     PHYSICAL EXAM:   Vitals:   10/29/18 1100 10/29/18 1130 10/29/18 1201 10/29/18 1314  BP: (!) 108/59 (!) 116/57 126/72 138/75  Pulse: 81 81 80 79  Resp:   18 18  Temp:   98.4 F (36.9 C) 98.6 F (37 C)  TempSrc:    Oral  SpO2:   97% 99%  Weight:   89.9 kg   Height:        GENERAL: The patient is a well-nourished male, in no acute distress. The vital signs are documented above. CARDIOVASCULAR: There is a regular rate and rhythm. PULMONARY: There is good air exchange bilaterally without wheezing or rales. His right IJ catheter site looks fine. He has a new right below the knee amputation.  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 He has a left brachiocephalic fistula with a good thrill, but is still somewhat small from the mid upper up up to the shoulder.   DATA:   CHEST X-RAY: This catheter tip is in the right atrium.  I do not see any kinks in the catheter.  MEDICAL ISSUES:   POORLY FUNCTIONING RIGHT IJ TUNNELED DIALYSIS CATHETER: This patient has a poorly  functioning right IJ J tunneled dialysis catheter.  This was originally placed on 09/13/2018 and then replaced on 10/25/2018.  At both times the catheter appeared to be in good position with no evidence of kinking.  However this is now the second time the catheter has needed to be replaced in just over a month.  As he is just eaten he will be scheduled for placement of new catheter tomorrow.  He may require central venogram to assess his superior vena cava given the problems he is been having with catheters.  [name] [facility] [phone]

## 2018-10-29 NOTE — Progress Notes (Addendum)
[facility] Progress Note   Subjective: No C/Os at present. HD completed. Still having issues with TDC.    Objective Vitals:   10/29/18 1020 10/29/18 1030 10/29/18 1100 10/29/18 1130  BP: (!) 144/67 130/66 (!) 108/59 (!) 116/57  Pulse: 75 74 81 81  Resp:      Temp:      TempSrc:      SpO2:      Weight:      Height:       Physical Exam General: WN,WD, NAD Heart: S1,S2 RRR Lungs: CTAB Abdomen: active BS  Extremities: L BKA no stump edema. R BKA in stump protector.  Dialysis Access: RIJ TDC drsg CDI, Maturing LUA AVF +T/B  Dialysis Orders:  Additional Objective Labs: Basic Metabolic Panel: Recent Labs  Lab 10/24/18 1334 10/25/18 0618 10/26/18 0926 10/28/18 0830 10/29/18 0759  NA 136 134* 138 136 136  K 3.4* 3.7 3.8 4.6 4.7  CL 102 100  --  102 101  CO2 25 25  --  25 24  GLUCOSE 182* 189* 185* 166* 184*  BUN 43* 34*  --  52* 60*  CREATININE 4.55* 4.75*  --  5.27* 6.02*  CALCIUM 7.9* 8.3*  --  8.1* 8.2*  PHOS 3.8  --   --  5.2* 6.8*   Liver Function Tests: Recent Labs  Lab 10/24/18 1334 10/28/18 0830 10/29/18 0759  ALBUMIN 2.2* 2.3* 2.2*   No results for input(s): LIPASE, AMYLASE in the last 168 hours. CBC: Recent Labs  Lab 10/22/18 1852  10/24/18 0634 10/25/18 0618 10/26/18 0639 10/26/18 0926 10/28/18 0830 10/29/18 0800  WBC 16.2*   < > 12.3* 12.0* 12.9*  --  11.5* 13.1*  NEUTROABS 13.2*  --   --   --   --   --   --   --   HGB 8.2*   < > 7.1* 7.0* 8.7* 9.2* 8.7* 8.3*  HCT 28.4*   < > 25.3* 25.2* 30.2* 27.0* 31.0* 29.5*  MCV 77.2*   < > 77.6* 76.8* 78.9*  --  81.4 80.6  PLT 167   < > 226 160 134*  --  224 278   < > = values in this interval not displayed.   Blood Culture    Component Value Date/Time   SDES BLOOD LEFT HAND 09/10/2018 0034   SPECREQUEST  09/10/2018 0034    BOTTLES DRAWN AEROBIC ONLY Blood Culture adequate volume   CULT  09/10/2018 0034    NO GROWTH 5 DAYS Performed at [facility], [place] [address]  [place], [place] [address]    REPTSTATUS 09/15/2018 FINAL 09/10/2018 0034    Cardiac Enzymes: No results for input(s): CKTOTAL, CKMB, CKMBINDEX, TROPONINI in the last 168 hours. CBG: Recent Labs  Lab 10/28/18 0747 10/28/18 0822 10/28/18 1146 10/28/18 1703 10/28/18 2028  GLUCAP 184* 164* 181* 186* 156*   Iron Studies: No results for input(s): IRON, TIBC, TRANSFERRIN, FERRITIN in the last 72 hours. @lablastinr3 @ Studies/Results: No results found. Medications: . sodium chloride    . sodium chloride    . sodium chloride     . sodium chloride   Intravenous Once  . ammonium lactate   Topical BID  . aspirin EC  81 mg Oral Daily  . atorvastatin  10 mg Oral QHS  . calcitRIOL  0.5 mcg Oral Q T,Th,Sa-HD  . carvedilol  12.5 mg Oral BID WC  . Chlorhexidine Gluconate Cloth  6 each Topical Q0600  . darbepoetin (ARANESP) injection -  DIALYSIS  200 mcg Intravenous Q Thu-HD  . docusate sodium  100 mg Oral BID  . feeding supplement (PRO-STAT SUGAR FREE 64)  30 mL Oral BID  . gabapentin  300 mg Oral QHS  . heparin injection (subcutaneous)  5,000 Units Subcutaneous Q8H  . insulin aspart  0-9 Units Subcutaneous TID WC  . insulin aspart  3 Units Subcutaneous TID WC  . insulin glargine  20 Units Subcutaneous BID  . multivitamin  1 tablet Oral QHS  . pantoprazole  40 mg Oral Daily     Dialysis Orders:  Center:[facility] 4 hrs 15 min 180NRe 400/Autoflow 2.0 91.5 kg 2.0 K/ 2.0 Ca  RIJ TDC/Maturing L AVF  -Calcitriol 0.30mcg PO TIW -Mircera 225 mcg IV q 2 weeksNext due 10/24/18  Assessment/Plan: 1. Right foot Osteomyelitis - On IV Vanc. S/p R BKA 11/23 per Dr. [name] 2. ESRD - HD TTS s/p [facility] exchange 11/22. Was unable to get blood return [facility] 11/25. Packed with cath flo overnight. Attempted HD today. Could get adequate BFR but very positional with constant high pressure alarms. Ask [facility] to revisit.  3. HTN/volume - BP stable. On Coreg 12.5 bid. No volume on CXR. Not to EDW by weights  here.Anticipate lower EDW s/p BKA.UFG 3.5 today.   4. Anemia- Hgb8.7s/p 2 units prbcs 11/22. 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 On Calcitriol. Start Renvela 800 mg PO TID AC with meals. .  6. Nutrition - Albumin 2.3. Add prostat 7. PAD - Status post revascularization of RLE 09/16/2018 8. DM - insulin per primary   Rita H. Brown NP-C 10/29/2018, 12:13 PM  [facility] [phone]  Pt seen, examined and agree w A/P as above.  [name] MD [employer] pager [phone]   10/29/2018, 2:19 PM

## 2018-10-29 NOTE — Progress Notes (Signed)
PT Cancellation Note  Patient Details Name: [name] MRN: [id number] DOB: 1961/07/02   Cancelled Treatment:    Reason Eval/Treat Not Completed: Other (comment).  Pt was seen for evaluation of mobility and noted he was complaining of pain but then declined meds.  Talked with nursing about the refusal and they have given meds in the last hour of PT arrival.  Talked with pt about the need to re-start therapy and he has agreed to work in the AM.   [name] 10/29/2018, 4:30 PM   [name], PT MS Acute Rehab Dept. Number: [place]

## 2018-10-29 NOTE — Progress Notes (Signed)
3 days Post-op S/P R BKA  I call and spoke to the patient.  He reports that his pain is better controlled today.  He states that he has worked well with therapy.  He has obtained his stump protector.  He denies fever, chills, N/V, CP, SOB.    Plan: I will stop by in the morning to see the patient NWB R LE Stump protector at all times Up with therapy Plan on 2 week outpatient post-op visit with Dr. Annia Friendly [facility] Office:  [phone]

## 2018-10-29 NOTE — H&P (View-Only) (Signed)
   Patient name: [name] MRN: [id number] DOB: 09-Dec-1960 Sex: male  REASON FOR CONSULT:   Poorly functioning left IJ tunneled dialysis catheter.  HPI:   [name] is a pleasant 57 y.o. male wife placed a right IJ 23 cm tunneled dialysis catheter on and worked on his left brachiocephalic fistula on 38/75/6433.  On 10/25/2018, this catheter was replaced because it was not functioning well.  He was unable to do his dialysis treatment today or yesterday because the catheter was not flowing adequately.  We have been asked to change his catheter.   He just returned from dialysis and ate at 3 PM.  REVIEW OF SYSTEMS:  [X]  denotes positive finding, [ ]  denotes negative finding Vascular    Leg swelling    Cardiac    Chest pain or chest pressure:    Shortness of breath upon exertion:    Short of breath when lying flat:    Irregular heart rhythm:    Constitutional    Fever or chills:     PHYSICAL EXAM:   Vitals:   10/29/18 1100 10/29/18 1130 10/29/18 1201 10/29/18 1314  BP: (!) 108/59 (!) 116/57 126/72 138/75  Pulse: 81 81 80 79  Resp:   18 18  Temp:   98.4 F (36.9 C) 98.6 F (37 C)  TempSrc:    Oral  SpO2:   97% 99%  Weight:   89.9 kg   Height:        GENERAL: The patient is a well-nourished male, in no acute distress. The vital signs are documented above. CARDIOVASCULAR: There is a regular rate and rhythm. PULMONARY: There is good air exchange bilaterally without wheezing or rales. His right IJ catheter site looks fine. He has a new right below the knee amputation.  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 He has a left brachiocephalic fistula with a good thrill, but is still somewhat small from the mid upper up up to the shoulder.   DATA:   CHEST X-RAY: This catheter tip is in the right atrium.  I do not see any kinks in the catheter.  MEDICAL ISSUES:   POORLY FUNCTIONING RIGHT IJ TUNNELED DIALYSIS CATHETER: This patient has a poorly  functioning right IJ J tunneled dialysis catheter.  This was originally placed on 09/13/2018 and then replaced on 10/25/2018.  At both times the catheter appeared to be in good position with no evidence of kinking.  However this is now the second time the catheter has needed to be replaced in just over a month.  As he is just eaten he will be scheduled for placement of new catheter tomorrow.  He may require central venogram to assess his superior vena cava given the problems he is been having with catheters.  [name] [facility] [phone]

## 2018-10-29 NOTE — Discharge Instructions (Signed)
[name] Simmer, MD [place]  Please read the following information regarding your care after surgery.  Medications  You only need a prescription for the narcotic pain medicine (ex. oxycodone, Percocet, Norco).  All of the other medicines listed below are available over the counter. X acetominophen (Tylenol) 650 mg every 4-6 hours as you need for minor to moderate pain X narcotic as prescribed for severe pain  Narcotic pain medicine (ex. oxycodone, Percocet, Vicodin) will cause constipation.  To prevent this problem, take the following medicines while you are taking any pain medicine. X docusate sodium (Colace) 100 mg twice a day X senna (Senokot) 2 tablets twice a day  Weight Bearing X Do not bear any weight on the operated leg or foot.  Cast / Splint / Dressing X Keep your splint, cast or dressing clean and dry.  Dont put anything (coat hanger, pencil, etc) down inside of it.  If it gets damp, use a hair dryer on the cool setting to dry it.  If it gets soaked, call the office to schedule an appointment for a cast change. X Wear your stump protector at all times  After your dressing, cast or splint is removed; you may shower, but do not soak or scrub the wound.  Allow the water to run over it, and then gently pat it dry.  Swelling It is normal for you to have swelling where you had surgery.  To reduce swelling and pain, keep your toes above your nose for at least 3 days after surgery.  It may be necessary to keep your foot or leg elevated for several weeks.  If it hurts, it should be elevated.  Follow Up Call my office at [phone] when you are discharged from the hospital or surgery center to schedule an appointment to be seen two weeks after surgery.  Call my office at [phone] if you develop a fever >101.5 F, nausea, vomiting, bleeding from the surgical site or severe pain.

## 2018-10-30 ENCOUNTER — Inpatient Hospital Stay (HOSPITAL_COMMUNITY): Payer: Medicare Other | Admitting: Anesthesiology

## 2018-10-30 ENCOUNTER — Encounter (HOSPITAL_COMMUNITY): Payer: Self-pay | Admitting: Certified Registered Nurse Anesthetist

## 2018-10-30 ENCOUNTER — Encounter (HOSPITAL_COMMUNITY)
Admission: EM | Disposition: A | Discharge status: Rehabilitation Facility | Payer: Self-pay | Source: Home / Self Care | Attending: Internal Medicine

## 2018-10-30 DIAGNOSIS — D649 Anemia, unspecified: Secondary | ICD-10-CM

## 2018-10-30 HISTORY — PX: VENOGRAM: SHX5497

## 2018-10-30 HISTORY — PX: INSERTION OF DIALYSIS CATHETER: SHX1324

## 2018-10-30 LAB — GLUCOSE, CAPILLARY
GLUCOSE-CAPILLARY: 117 mg/dL — AB (ref 70–99)
GLUCOSE-CAPILLARY: 136 mg/dL — AB (ref 70–99)
GLUCOSE-CAPILLARY: 114 mg/dL — AB (ref 70–99)
Glucose-Capillary: 124 mg/dL — ABNORMAL HIGH (ref 70–99)
Glucose-Capillary: 132 mg/dL — ABNORMAL HIGH (ref 70–99)
Glucose-Capillary: 122 mg/dL — ABNORMAL HIGH (ref 70–99)

## 2018-10-30 SURGERY — INSERTION OF DIALYSIS CATHETER
Anesthesia: Monitor Anesthesia Care | Site: Neck

## 2018-10-30 MED ORDER — DEXAMETHASONE SODIUM PHOSPHATE 10 MG/ML IJ SOLN
INTRAMUSCULAR | Status: AC
  Filled 2018-10-30: qty 1

## 2018-10-30 MED ORDER — DARBEPOETIN ALFA 200 MCG/0.4ML IJ SOSY: 200.0000 ug | PREFILLED_SYRINGE | INTRAMUSCULAR | Status: DC

## 2018-10-30 MED ORDER — CEFAZOLIN SODIUM-DEXTROSE 2-3 GM-%(50ML) IV SOLR
INTRAVENOUS | Status: DC | PRN
  Administered 2018-10-30: 2 g via INTRAVENOUS

## 2018-10-30 MED ORDER — FENTANYL CITRATE (PF) 100 MCG/2ML IJ SOLN: 25.0000 ug | INTRAMUSCULAR | Status: DC | PRN

## 2018-10-30 MED ORDER — MIDAZOLAM HCL 2 MG/2ML IJ SOLN
INTRAMUSCULAR | Status: AC
  Filled 2018-10-30: qty 2

## 2018-10-30 MED ORDER — TIZANIDINE HCL 2 MG PO TABS
2.0000 mg | ORAL_TABLET | Freq: Once | ORAL | Status: AC
  Administered 2018-10-30: 2 mg via ORAL
  Filled 2018-10-30: qty 1

## 2018-10-30 MED ORDER — PROPOFOL 500 MG/50ML IV EMUL
INTRAVENOUS | Status: DC | PRN
  Administered 2018-10-30: 75 ug/kg/min via INTRAVENOUS

## 2018-10-30 MED ORDER — TIZANIDINE HCL 2 MG PO TABS: 2.0000 mg | ORAL_TABLET | Freq: Three times a day (TID) | ORAL | Status: DC | PRN

## 2018-10-30 MED ORDER — IODIXANOL 320 MG/ML IV SOLN
INTRAVENOUS | Status: DC | PRN
  Administered 2018-10-30: 20 mL via INTRAVENOUS

## 2018-10-30 MED ORDER — CEFAZOLIN SODIUM 1 G IJ SOLR
INTRAMUSCULAR | Status: AC
  Filled 2018-10-30: qty 20

## 2018-10-30 MED ORDER — ONDANSETRON HCL 4 MG/2ML IJ SOLN: 4.0000 mg | Freq: Once | INTRAMUSCULAR | Status: DC | PRN

## 2018-10-30 MED ORDER — SODIUM CHLORIDE 0.9 % IV SOLN
INTRAVENOUS | Status: DC | PRN
  Administered 2018-10-30: 500 mL

## 2018-10-30 MED ORDER — SODIUM CHLORIDE 0.9 % IV SOLN
INTRAVENOUS | Status: DC
  Administered 2018-10-30: 08:00:00 via INTRAVENOUS

## 2018-10-30 MED ORDER — LIDOCAINE-EPINEPHRINE (PF) 1 %-1:200000 IJ SOLN
INTRAMUSCULAR | Status: AC
  Filled 2018-10-30: qty 30

## 2018-10-30 MED ORDER — PROPOFOL 1000 MG/100ML IV EMUL
INTRAVENOUS | Status: AC
  Filled 2018-10-30: qty 100

## 2018-10-30 MED ORDER — KETOROLAC TROMETHAMINE 30 MG/ML IJ SOLN
INTRAMUSCULAR | Status: AC
  Filled 2018-10-30: qty 1

## 2018-10-30 MED ORDER — EPHEDRINE 5 MG/ML INJ
INTRAVENOUS | Status: AC
  Filled 2018-10-30: qty 10

## 2018-10-30 MED ORDER — 0.9 % SODIUM CHLORIDE (POUR BTL) OPTIME
TOPICAL | Status: DC | PRN
  Administered 2018-10-30: 1000 mL

## 2018-10-30 MED ORDER — PHENYLEPHRINE 40 MCG/ML (10ML) SYRINGE FOR IV PUSH (FOR BLOOD PRESSURE SUPPORT)
PREFILLED_SYRINGE | INTRAVENOUS | Status: AC
  Filled 2018-10-30: qty 20

## 2018-10-30 MED ORDER — HEPARIN SODIUM (PORCINE) 1000 UNIT/ML IJ SOLN
INTRAMUSCULAR | Status: DC | PRN
  Administered 2018-10-30: 1000 [IU]

## 2018-10-30 MED ORDER — FENTANYL CITRATE (PF) 250 MCG/5ML IJ SOLN
INTRAMUSCULAR | Status: AC
  Filled 2018-10-30: qty 5

## 2018-10-30 MED ORDER — MIDAZOLAM HCL 5 MG/5ML IJ SOLN
INTRAMUSCULAR | Status: DC | PRN
  Administered 2018-10-30: 2 mg via INTRAVENOUS

## 2018-10-30 MED ORDER — SODIUM CHLORIDE 0.9 % IV SOLN
INTRAVENOUS | Status: AC
  Filled 2018-10-30: qty 1.2

## 2018-10-30 MED ORDER — PROPOFOL 10 MG/ML IV BOLUS
INTRAVENOUS | Status: AC
  Filled 2018-10-30: qty 20

## 2018-10-30 MED ORDER — LIDOCAINE-EPINEPHRINE (PF) 1 %-1:200000 IJ SOLN
INTRAMUSCULAR | Status: DC | PRN
  Administered 2018-10-30: 13 mL

## 2018-10-30 MED ORDER — ONDANSETRON HCL 4 MG/2ML IJ SOLN
INTRAMUSCULAR | Status: AC
  Filled 2018-10-30: qty 2

## 2018-10-30 MED ORDER — SUCCINYLCHOLINE CHLORIDE 200 MG/10ML IV SOSY
PREFILLED_SYRINGE | INTRAVENOUS | Status: AC
  Filled 2018-10-30: qty 10

## 2018-10-30 MED ORDER — HEPARIN SODIUM (PORCINE) 1000 UNIT/ML IJ SOLN
INTRAMUSCULAR | Status: AC
  Filled 2018-10-30: qty 1

## 2018-10-30 MED ORDER — HYDROCORTISONE NA SUCCINATE PF 250 MG IJ SOLR
INTRAMUSCULAR | Status: AC
  Filled 2018-10-30: qty 250

## 2018-10-30 SURGICAL SUPPLY — 93 items
BAG BANDED W/RUBBER/TAPE 36X54 (MISCELLANEOUS) ×2 IMPLANT
BAG DECANTER FOR FLEXI CONT (MISCELLANEOUS) ×4 IMPLANT
BAG SNAP BAND KOVER 36X36 (MISCELLANEOUS) ×4 IMPLANT
BANDAGE ACE 4X5 VEL STRL LF (GAUZE/BANDAGES/DRESSINGS) IMPLANT
BANDAGE ESMARK 6X9 LF (GAUZE/BANDAGES/DRESSINGS) IMPLANT
BIOPATCH RED 1 DISK 7.0 (GAUZE/BANDAGES/DRESSINGS) ×3 IMPLANT
BIOPATCH RED 1IN DISK 7.0MM (GAUZE/BANDAGES/DRESSINGS) ×1
BLADE SURG 11 STRL SS (BLADE) ×2 IMPLANT
BNDG ESMARK 6X9 LF (GAUZE/BANDAGES/DRESSINGS)
CANISTER SUCT 3000ML PPV (MISCELLANEOUS) ×2 IMPLANT
CATH CANNON HEMO 15FR 19 (HEMODIALYSIS SUPPLIES) ×2 IMPLANT
CATH OMNI FLUSH .035X70CM (CATHETERS) ×2 IMPLANT
CATH PALINDROME RT-P 15FX19CM (CATHETERS) IMPLANT
CATH PALINDROME RT-P 15FX23CM (CATHETERS) IMPLANT
CATH PALINDROME RT-P 15FX28CM (CATHETERS) IMPLANT
CATH PALINDROME RT-P 15FX55CM (CATHETERS) IMPLANT
CLIP VESOCCLUDE MED 24/CT (CLIP) IMPLANT
CLIP VESOCCLUDE SM WIDE 24/CT (CLIP) IMPLANT
COVER DOME SNAP 22 D (MISCELLANEOUS) ×4 IMPLANT
COVER PROBE W GEL 5X96 (DRAPES) ×4 IMPLANT
COVER SURGICAL LIGHT HANDLE (MISCELLANEOUS) ×4 IMPLANT
COVER WAND RF STERILE (DRAPES) ×2 IMPLANT
CUFF TOURNIQUET SINGLE 24IN (TOURNIQUET CUFF) IMPLANT
CUFF TOURNIQUET SINGLE 34IN LL (TOURNIQUET CUFF) IMPLANT
CUFF TOURNIQUET SINGLE 44IN (TOURNIQUET CUFF) IMPLANT
DERMABOND ADVANCED (GAUZE/BANDAGES/DRESSINGS) ×2
DERMABOND ADVANCED .7 DNX12 (GAUZE/BANDAGES/DRESSINGS) ×2 IMPLANT
DRAIN CHANNEL 15F RND FF W/TCR (WOUND CARE) IMPLANT
DRAPE C-ARM 42X72 X-RAY (DRAPES) ×2 IMPLANT
DRAPE CHEST BREAST 15X10 FENES (DRAPES) ×4 IMPLANT
DRAPE X-RAY CASS 24X20 (DRAPES) IMPLANT
DRSG COVADERM 4X6 (GAUZE/BANDAGES/DRESSINGS) ×2 IMPLANT
ELECT REM PT RETURN 9FT ADLT (ELECTROSURGICAL)
ELECTRODE REM PT RTRN 9FT ADLT (ELECTROSURGICAL) IMPLANT
EVACUATOR SILICONE 100CC (DRAIN) IMPLANT
GAUZE 4X4 16PLY RFD (DISPOSABLE) ×4 IMPLANT
GLOVE BIOGEL PI IND STRL 7.5 (GLOVE) ×2 IMPLANT
GLOVE BIOGEL PI INDICATOR 7.5 (GLOVE) ×2
GLOVE ECLIPSE 7.5 STRL STRAW (GLOVE) ×2 IMPLANT
GLOVE SURG SS PI 7.5 STRL IVOR (GLOVE) ×4 IMPLANT
GOWN STRL REUS W/ TWL LRG LVL3 (GOWN DISPOSABLE) ×4 IMPLANT
GOWN STRL REUS W/ TWL XL LVL3 (GOWN DISPOSABLE) ×2 IMPLANT
GOWN STRL REUS W/TWL LRG LVL3 (GOWN DISPOSABLE)
GOWN STRL REUS W/TWL XL LVL3 (GOWN DISPOSABLE) ×4
HEMOSTAT SNOW SURGICEL 2X4 (HEMOSTASIS) IMPLANT
KIT BASIN OR (CUSTOM PROCEDURE TRAY) ×4 IMPLANT
KIT TURNOVER KIT B (KITS) ×4 IMPLANT
MARKER GRAFT CORONARY BYPASS (MISCELLANEOUS) IMPLANT
NDL 18GX1X1/2 (RX/OR ONLY) (NEEDLE) ×2 IMPLANT
NDL HYPO 25GX1X1/2 BEV (NEEDLE) ×2 IMPLANT
NDL PERC 18GX7CM (NEEDLE) ×2 IMPLANT
NEEDLE 18GX1X1/2 (RX/OR ONLY) (NEEDLE) ×4 IMPLANT
NEEDLE HYPO 25GX1X1/2 BEV (NEEDLE) ×4 IMPLANT
NEEDLE PERC 18GX7CM (NEEDLE) ×4 IMPLANT
NS IRRIG 1000ML POUR BTL (IV SOLUTION) ×4 IMPLANT
PACK GENERAL/GYN (CUSTOM PROCEDURE TRAY) ×2 IMPLANT
PACK PERIPHERAL VASCULAR (CUSTOM PROCEDURE TRAY) IMPLANT
PACK SURGICAL SETUP 50X90 (CUSTOM PROCEDURE TRAY) ×4 IMPLANT
PAD ARMBOARD 7.5X6 YLW CONV (MISCELLANEOUS) ×8 IMPLANT
PADDING CAST ABS 6INX4YD NS (CAST SUPPLIES)
PADDING CAST ABS COTTON 6X4 NS (CAST SUPPLIES) IMPLANT
SET COLLECT BLD 21X3/4 12 (NEEDLE) IMPLANT
SET MICROPUNCTURE 5F STIFF (MISCELLANEOUS) ×2 IMPLANT
SHEATH AVANTI 11CM 5FR (SHEATH) ×4 IMPLANT
SOAP 2 % CHG 4 OZ (WOUND CARE) ×4 IMPLANT
STOPCOCK 4 WAY LG BORE MALE ST (IV SETS) IMPLANT
STOPCOCK MORSE 400PSI 3WAY (MISCELLANEOUS) ×4 IMPLANT
SUT ETHILON 3 0 PS 1 (SUTURE) ×4 IMPLANT
SUT PROLENE 5 0 C 1 24 (SUTURE) IMPLANT
SUT PROLENE 6 0 BV (SUTURE) IMPLANT
SUT PROLENE 7 0 BV 1 (SUTURE) IMPLANT
SUT SILK 2 0 SH (SUTURE) IMPLANT
SUT SILK 3 0 (SUTURE)
SUT SILK 3-0 18XBRD TIE 12 (SUTURE) IMPLANT
SUT VIC AB 2-0 CT1 27 (SUTURE)
SUT VIC AB 2-0 CT1 TAPERPNT 27 (SUTURE) IMPLANT
SUT VIC AB 3-0 SH 27 (SUTURE)
SUT VIC AB 3-0 SH 27X BRD (SUTURE) IMPLANT
SUT VICRYL 4-0 PS2 18IN ABS (SUTURE) ×4 IMPLANT
SYR 10ML LL (SYRINGE) ×4 IMPLANT
SYR 20CC LL (SYRINGE) ×8 IMPLANT
SYR 30ML LL (SYRINGE) ×4 IMPLANT
SYR 5ML LL (SYRINGE) ×4 IMPLANT
SYR CONTROL 10ML LL (SYRINGE) ×4 IMPLANT
SYR MEDRAD MARK V 150ML (SYRINGE) ×4 IMPLANT
TOWEL GREEN STERILE (TOWEL DISPOSABLE) ×8 IMPLANT
TOWEL GREEN STERILE FF (TOWEL DISPOSABLE) ×4 IMPLANT
TRAY FOLEY MTR SLVR 16FR STAT (SET/KITS/TRAYS/PACK) IMPLANT
TUBING EXTENTION W/L.L. (IV SETS) IMPLANT
TUBING HIGH PRESSURE 120CM (CONNECTOR) ×4 IMPLANT
UNDERPAD 30X30 (UNDERPADS AND DIAPERS) IMPLANT
WATER STERILE IRR 1000ML POUR (IV SOLUTION) ×4 IMPLANT
WIRE BENTSON .035X145CM (WIRE) ×6 IMPLANT

## 2018-10-30 NOTE — Progress Notes (Addendum)
[facility] Progress Note   Dialysis Orders: GOC TThS 4 hrs 15 min 180NRe 400/Autoflow 2.0 91.5 kg 2.0 K/ 2.0 Ca  RIJ TDC/Maturing L AVF  -Calcitriol 0.38mcgPO TIW -Mircera 225 mcg IV q 2 weeksNext due 10/24/18  Assessment/Plan: 1. 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 ESRD - TTS - catheter replaced 11/27- able to do HD 11/26 net UF 2.5 L - next HD Saturday -  3. Anemia - s/p 2 units PRBC 11/22 Hgb 8.3 - ARanesp 200 given 11/21- next dose scheduled for Saturday  4. Secondary hyperparathyroidism - calcitriol/renvela 5. HTN/volume - post HD wt 89.9 Tuesday - will need a lower edw for d/c 6. Nutrition -renal die/[rostat/renavite alb 2.2 7. Disp  unclear  [name], PA-C [facility] Bee[facility] [phone] 10/30/2018,12:03 PM  LOS: 8 days   Pt seen, examined and agree w A/P as above.  [name] MD [facility] pager [phone]   10/30/2018, 12:48 PM    Subjective:   Some pain in right BKA  Objective Vitals:   10/30/18 1023 10/30/18 1026 10/30/18 1039 10/30/18 1053  BP:  100/60 101/61 123/62  Pulse:  71 71   Resp:  11 13   Temp: (!) 97.3 F (36.3 C)     TempSrc:      SpO2:  92% 99%   Weight:      Height:       Physical Exam General: NAD sitting in bed after catheter placement - a little drowsy Heart: RRR Lungs: no rales Abdomen: soft NT Extremities:left BKA no edema right BKA with stump protector Dialysis Access: new right IJ [facility] 11/27   Additional Objective Labs: Basic Metabolic Panel: Recent Labs  Lab 10/24/18 1334 10/25/18 0618 10/26/18 0926 10/28/18 0830 10/29/18 0759  NA 136 134* 138 136 136  K 3.4* 3.7 3.8 4.6 4.7  CL 102 100  --  102 101  CO2 25 25  --  25 24  GLUCOSE 182* 189* 185* 166* 184*  BUN 43* 34*  --  52* 60*  CREATININE 4.55* 4.75*  --  5.27* 6.02*  CALCIUM 7.9* 8.3*  --  8.1* 8.2*  PHOS 3.8  --   --  5.2* 6.8*    Liver Function Tests: Recent Labs  Lab 10/24/18 1334 10/28/18 0830 10/29/18 0759  ALBUMIN 2.2* 2.3* 2.2*   No results for input(s): LIPASE, AMYLASE in the last 168 hours. CBC: Recent Labs  Lab 10/24/18 0634 10/25/18 0618 10/26/18 0639 10/26/18 0926 10/28/18 0830 10/29/18 0800  WBC 12.3* 12.0* 12.9*  --  11.5* 13.1*  HGB 7.1* 7.0* 8.7* 9.2* 8.7* 8.3*  HCT 25.3* 25.2* 30.2* 27.0* 31.0* 29.5*  MCV 77.6* 76.8* 78.9*  --  81.4 80.6  PLT 226 160 134*  --  224 278   Blood Culture    Component Value Date/Time   SDES BLOOD LEFT HAND 09/10/2018 0034   SPECREQUEST  09/10/2018 0034    BOTTLES DRAWN AEROBIC ONLY Blood Culture adequate volume   CULT  09/10/2018 0034    NO GROWTH 5 DAYS Performed at [facility], [place] [address] [place], [place] [address]    REPTSTATUS 09/15/2018 FINAL 09/10/2018 0034    Cardiac Enzymes: No results for input(s): CKTOTAL, CKMB, CKMBINDEX, TROPONINI in the last 168 hours. CBG: Recent Labs  Lab 10/29/18 1623 10/29/18 2141 10/30/18 0749 10/30/18 1026 10/30/18 1127  GLUCAP 181* 131* 124* 132* 136*  Iron Studies: No results for input(s): IRON, TIBC, TRANSFERRIN, FERRITIN in the last 72 hours. Lab Results  Component Value Date   INR 1.10 10/25/2018   INR 1.17 03/31/2017   INR 1.09 12/18/2013   Studies/Results: No results found. Medications: . sodium chloride    . sodium chloride 10 mL/hr at 10/30/18 0826  .  ceFAZolin (ANCEF) IV     . sodium chloride   Intravenous Once  . ammonium lactate   Topical BID  . aspirin EC  81 mg Oral Daily  . atorvastatin  10 mg Oral QHS  . calcitRIOL  0.5 mcg Oral Q T,Th,Sa-HD  . carvedilol  12.5 mg Oral BID WC  . Chlorhexidine Gluconate Cloth  6 each Topical Q0600  . darbepoetin (ARANESP) injection - DIALYSIS  200 mcg Intravenous Q Thu-HD  . docusate sodium  100 mg Oral BID  . feeding supplement (PRO-STAT SUGAR FREE 64)  30 mL Oral BID  . gabapentin  300 mg Oral QHS  . heparin injection  (subcutaneous)  5,000 Units Subcutaneous Q8H  . insulin aspart  0-9 Units Subcutaneous TID WC  . insulin aspart  3 Units Subcutaneous TID WC  . insulin glargine  20 Units Subcutaneous BID  . multivitamin  1 tablet Oral QHS  . pantoprazole  40 mg Oral Daily  . sevelamer carbonate  800 mg Oral TID WC

## 2018-10-30 NOTE — Care Management Important Message (Signed)
Important Message  Patient Details  Name: [name] MRN: [id number] Date of Birth: May 05, 1961   Medicare Important Message Given:  Yes    Madalina Rosman 10/30/2018, 2:25 PM

## 2018-10-30 NOTE — Interval H&P Note (Signed)
History and Physical Interval Note:  10/30/2018 8:39 AM  [name]  has presented today for surgery, with the diagnosis of CLOTTED ACCESS  The various methods of treatment have been discussed with the patient and family. After consideration of risks, benefits and other options for treatment, the patient has consented to  Procedure(s): INSERTION OF DIALYSIS CATHETER (N/A) VENOGRAM CENTRAL (N/A) as a surgical intervention .  The patient's history has been reviewed, patient examined, no change in status, stable for surgery.  I have reviewed the patient's chart and labs.  Questions were answered to the patient's satisfaction.     [name]

## 2018-10-30 NOTE — Anesthesia Postprocedure Evaluation (Signed)
Anesthesia Post Note  Patient: [name]  Procedure(s) Performed: Exchange OF Right internal jugular DIALYSIS CATHETER (N/A Neck) VENOGRAM CENTRAL (N/A )     Patient location during evaluation: PACU Anesthesia Type: MAC Level of consciousness: awake and alert Pain management: pain level controlled Vital Signs Assessment: post-procedure vital signs reviewed and stable Respiratory status: spontaneous breathing, nonlabored ventilation and respiratory function stable Cardiovascular status: stable and blood pressure returned to baseline Postop Assessment: no apparent nausea or vomiting Anesthetic complications: no    Last Vitals:  Vitals:   10/30/18 1039 10/30/18 1053  BP: 101/61 123/62  Pulse: 71   Resp: 13   Temp:    SpO2: 99%     Last Pain:  Vitals:   10/30/18 1053  TempSrc:   PainSc: 0-No pain                 [name],W. EDMOND

## 2018-10-30 NOTE — Transfer of Care (Signed)
Immediate Anesthesia Transfer of Care Note  Patient: [name]  Procedure(s) Performed: Exchange OF Right internal jugular DIALYSIS CATHETER (N/A Neck) VENOGRAM CENTRAL (N/A )  Patient Location: PACU  Anesthesia Type:MAC  Level of Consciousness: awake, alert , oriented and patient cooperative  Airway & Oxygen Therapy: Patient Spontanous Breathing and Patient connected to nasal cannula oxygen  Post-op Assessment: Report given to RN, Post -op Vital signs reviewed and stable and Patient moving all extremities X 4  Post vital signs: Reviewed and stable  Last Vitals:  Vitals Value Taken Time  BP 100/60 10/30/2018 10:26 AM  Temp 36.3 C 10/30/2018 10:23 AM  Pulse 71 10/30/2018 10:29 AM  Resp 12 10/30/2018 10:29 AM  SpO2 97 % 10/30/2018 10:29 AM  Vitals shown include unvalidated device data.  Last Pain:  Vitals:   10/30/18 0650  TempSrc:   PainSc: 8       Patients Stated Pain Goal: 0 (24/23/53 6144)  Complications: No apparent anesthesia complications

## 2018-10-30 NOTE — Op Note (Signed)
    Patient name: [name] MRN: [id number] DOB: 1961/10/12 Sex: male  10/30/2018 Pre-operative Diagnosis: Nonfunctioning dialysis catheter Post-operative diagnosis:  Same Surgeon:  [name] Assistants: None Procedure:   1: Removal of tunneled dialysis catheter   #2: Placement of a new 19 cm split tip Arrow tunneled dialysis catheter   #3: Central venogram Anesthesia: MAC Blood Loss: Minimal  specimens: None  Findings: No evidence of central stenosis  Indications: The patient has had 2 previous tunneled palindrome catheters.  He is not able to have successful dialysis sessions and so catheter exchange has been recommended.  Procedure:  The patient was identified in the holding area and taken to [place] 16  The patient was then placed supine on the table. MAC anesthesia was administered.  The patient was prepped and draped in the usual sterile fashion.  A time out was called and antibiotics were administered.  1% lidocaine was used for local anesthesia.  The patient's previous supraclavicular incision was opened with a 10 blade.  I dissected out the catheter and clamped it with a hemostat.  Next using Metzenbaum scissors, I dissected out the cuff of the existing catheter.  Once the catheter was freely mobile, I inserted a Bentson wire navigated into the inferior vena cava.  I then withdrew the catheter into the right innominate vein and performed a central venogram which showed no evidence of central venous stenosis.  The catheter was then transected.  I reinserted the Bentson wire and removed the remaining portion of the catheter and the peel-away sheath was placed.  A 19 cm split tip Arrow catheter was inserted and the peel-away sheath was removed.  A new skin exit site was selected below the clavicle.  The abdomen blade was used to make a skin nick and a subcutaneous tunnel was created.  The catheter was then brought through the tunnel and connected to the hub.  Both ports flushed  and aspirated without difficulty.  Fluoroscopy was used to confirm that the catheter tip was at the cavoatrial junction.  The appropriate volumes of heparin were placed into the catheter which was secured with 3-0 nylon.  4-0 Vicryl was used to close the neck incision.  There were no immediate complications.   Disposition: To PACU stable   V. [name], M.D. [facility] Office: [phone] Pager:  [phone]

## 2018-10-30 NOTE — Progress Notes (Signed)
Physical Therapy Treatment Patient Details Name: [name] MRN: [id number] DOB: [birth year] Today's Date: 10/30/2018    History of Present Illness Pt is a 57 y/o male admitted on 10/22/18 secondary to hypotension with dialysis. Pt also with reports of pain in R foot and is now s/p R transtibial amputation on 10/26/18. PMH including but not limited to L transtibial amputation, DM, peripheral neuropathy, HTN, HLD, tobacco abuse and ESRD.    PT Comments    Pt seen for Re-Evaluation following his new R transtibial amputation on 11/23. Pt currently able to achieve long sitting in bed with supervision and use of bilateral UEs on bed rails. Once seated upright, pt able to transition to sitting without any UE supports. Pt deferring transfers this session secondary to increased pain in R residual limb and asking for pain meds, RN was notified. Pt's RN also reported that two days ago, pt able to hop on his L prosthesis with use of RW to and from bathroom with standby assist. Pt is very motivated to move and progress with therapy. He is an excellent candidate for further intensive therapies at CIR prior to returning home with support. PT will continue to follow acutely to progress mobility as tolerated.     Follow Up Recommendations  CIR     Equipment Recommendations  None recommended by PT    Recommendations for Other Services Rehab consult     Precautions / Restrictions Precautions Precautions: Fall Precaution Comments: prior L BKA (prosthesis in room), NEW R BKA Restrictions Weight Bearing Restrictions: Yes RLE Weight Bearing: Non weight bearing    Mobility  Bed Mobility Overal bed mobility: Needs Assistance Bed Mobility: Supine to Sit;Sit to Supine     Supine to sit: Supervision Sit to supine: Supervision   General bed mobility comments: pt able to achieve long sitting in bed with supervision and use of bilateral UEs on bed rails  Transfers                 General  transfer comment: pt declining transfers at this time secondary to increased pain in R residual limb; per RN - pt able to hop on L prosthesis and using RW two days ago to go the bathroom  Ambulation/Gait                 Stairs             Wheelchair Mobility    Modified Rankin (Stroke Patients Only)       Balance Overall balance assessment: Needs assistance Sitting-balance support: No upper extremity supported Sitting balance-Leahy Scale: Fair Sitting balance - Comments: pt able to long sit in bed without UE supports                                    Cognition Arousal/Alertness: Awake/alert Behavior During Therapy: WFL for tasks assessed/performed Overall Cognitive Status: Within Functional Limits for tasks assessed                                        Exercises Amputee Exercises Quad Sets: AROM;Strengthening;Right;10 reps;Seated Hip ABduction/ADduction: AROM;Strengthening;Right;10 reps;Supine Knee Flexion: AROM;Strengthening;Right;10 reps;Supine Knee Extension: AROM;Strengthening;Right;10 reps;Supine Straight Leg Raises: AROM;Strengthening;Right;10 reps;Supine    General Comments        Pertinent Vitals/Pain Pain Assessment: 0-10 Pain Score: 10-Worst pain ever Pain  Location: R residual limb Pain Descriptors / Indicators: Aching;Guarding;Sore Pain Intervention(s): Monitored during session;Repositioned;Patient requesting pain meds-RN notified;RN gave pain meds during session    Home Living                      Prior Function            PT Goals (current goals can now be found in the care plan section) Acute Rehab PT Goals PT Goal Formulation: With patient Time For Goal Achievement: 11/06/18 Potential to Achieve Goals: Good Progress towards PT goals: Progressing toward goals    Frequency    Min 5X/week      PT Plan Discharge plan needs to be updated;Frequency needs to be updated     Co-evaluation              AM-PAC PT "6 Clicks" Mobility   Outcome Measure  Help needed turning from your back to your side while in a flat bed without using bedrails?: None Help needed moving from lying on your back to sitting on the side of a flat bed without using bedrails?: None Help needed moving to and from a bed to a chair (including a wheelchair)?: A Lot Help needed standing up from a chair using your arms (e.g., wheelchair or bedside chair)?: Total Help needed to walk in hospital room?: Total Help needed climbing 3-5 steps with a railing? : Total 6 Click Score: 13    End of Session   Activity Tolerance: Patient limited by pain;Patient limited by fatigue Patient left: in bed;with call bell/phone within reach Nurse Communication: Mobility status PT Visit Diagnosis: Other abnormalities of gait and mobility (R26.89);Pain Pain - Right/Left: Right Pain - part of body: (residual limb)     Time: 3267-1245 PT Time Calculation (min) (ACUTE ONLY): 20 min  Charges:                        Sherie Don, PT, DPT  Acute Rehabilitation Services Pager [phone] Office [place] 10/30/2018, 4:28 PM

## 2018-10-30 NOTE — Progress Notes (Signed)
PT Cancellation Note  Patient Details Name: [name] MRN: [id number] DOB: 08/13/1961   Cancelled Treatment:    Reason Eval/Treat Not Completed: Patient at procedure or test/unavailable. Pt off of the floor at this time. PT will continue to follow acutely.   [place] 10/30/2018, 8:21 AM

## 2018-10-30 NOTE — Anesthesia Procedure Notes (Signed)
Procedure Name: MAC Date/Time: 10/30/2018 9:25 AM Performed by: Carney Living, CRNA Pre-anesthesia Checklist: Patient identified, Emergency Drugs available, Suction available, Patient being monitored and Timeout performed Patient Re-evaluated:Patient Re-evaluated prior to induction Oxygen Delivery Method: Simple face mask

## 2018-10-30 NOTE — Progress Notes (Signed)
PROGRESS NOTE  [name]:[id number] DOB: 06-29-1961 DOA: 10/22/2018 PCP: System, Pcp Not In  HPI/Recap of past 24 hours: 56 y.o.malewith medical history significant ofinsulin-dependent diabetes mellitus, left BKA, right metatarsal foot amputation, peripheral neuropathy, hypertension, hyperlipidemia, tobacco abuse ESRD started on dialysis 3 weeks ago, recent history of lower extremity osteomyelitis and recent amputation presenting to the hospital for evaluation of hypotension at dialysis today. Patient was sent to the ED via EMS from dialysis. Per ED report, patient completed entire treatment.When they stood him up to weigh him at dialysis he had a syncopal/near syncopal episode and became hypotensive. He was given 650 cc bolus prior to arrival.  Patient reports having pain in his right foot which started a few days after his recent hospital discharge. States some days he is unable to bear weight on that leg. States he started noticing a few days ago that the bottom of his right foot has been peeling. Denies having any fevers, chills, nausea, or vomiting. States his appetite has been reduced since his prior hospitalization. He did finish a full course of doxycycline after his recent discharge.  Patient was recently admitted from September 09, 2018 to September 20 2018.He was transitioned to hemodialysis during this hospitalization. MRI of his right stump revealed early osteomyelitis. MRSA screening positive. He was treated with 10 days of IV antibiotics including vancomycin. Vascular surgery was consulted and patient underwent revascularization of his right lower extremity in September 16, 2018. Hospital course was complicated by intermittent severe pain in his right stump. Infectious disease was consulted and recommended doxycycline for 5 days.   10/30/18: seen and examined at his bedside. Reports spasms in his legs. No other complaints. Nephrology following.POD#0 post HD  cath replacement. Needs lower edw per nephrology.  Assessment/Plan: Principal Problem:   Right foot pain Active Problems:   HTN (hypertension)   Type 2 diabetes mellitus (HCC)   HLD (hyperlipidemia)   PVD (peripheral vascular disease) (HCC)   Hypokalemia   ESRD (end stage renal disease) (HCC)   Chronic anemia   Ambulatory dysfunction   GERD (gastroesophageal reflux disease)   R foot osteomyelitis s/p RBKA Done on 10/26/18 Pain management in place Avoid morphine  ESRD  Last HD Tuesday 10/29/18 Needs lower edw per renal  Clotted dialysis access Post HD cath replacement   10/30/18 by vasc surgery  PVD post revascularization RLE 09/16/18 C/w plavix and statin  Anemia of chronic disease/iron deficiency anemia -Hemoglobin 8.2 at presentation. Remains hemodynamically stable -Continue to follow, transfuse as needed  Ambulatory dysfunction due to amputation/right foot pain -PT consulted -s/p amputation per above  Hypertension -Continue home Coreg as tolerated -BP stable at this time  Hyperlipidemia -Continue home Lipitor as tolerated, stable  Type 2 diabetes A1c 7.5 on September 10, 2018. Most recent CBG 329. -Continue home Lantus 15 units twice daily -Sliding scale insulin sensitive -glucose in the mid-150/170's to 200's -Increase lantus to 20 units -will cont pre-meal coverage with novolog 3 units  GERD -Continue PPI as tolerated -Currently stable  DVT prophylaxis: Heparin subQ Code Status: Full  Disposition: DC to home when nephrology signs off.  Consultants:   Nephrology  Orthopedic Surgery  Vascular Surgery  Procedures:   R BKA 11/23     Objective: Vitals:   10/30/18 1023 10/30/18 1026 10/30/18 1039 10/30/18 1053  BP:  100/60 101/61 123/62  Pulse:  71 71   Resp:  11 13   Temp: (!) 97.3 F (36.3 C)     TempSrc:  SpO2:  92% 99%   Weight:      Height:        Intake/Output Summary (Last 24 hours) at 10/30/2018 1621 Last  data filed at 10/30/2018 1051 Gross per 24 hour  Intake 1150 ml  Output -  Net 1150 ml   Filed Weights   10/29/18 0720 10/29/18 1201 10/30/18 0828  Weight: 92.5 kg 89.9 kg 89.9 kg    Exam:  . General: 57 y.o. year-old male well developed well nourished in no acute distress.  Alert and oriented x3. . Cardiovascular: Regular rate and rhythm with no rubs or gallops.  No thyromegaly or JVD noted.   Marland Kitchen Respiratory: Clear to auscultation with no wheezes or rales. Good inspiratory effort. . Abdomen: Soft nontender nondistended with normal bowel sounds x4 quadrants. . Musculoskeletal: R BKA and LBKA stump has no ulcerative lesions. No lower extremity edema.  Marland Kitchen Psychiatry: Mood is appropriate for condition and setting   Data Reviewed: CBC: Recent Labs  Lab 10/24/18 0634 10/25/18 0618 10/26/18 0639 10/26/18 0926 10/28/18 0830 10/29/18 0800  WBC 12.3* 12.0* 12.9*  --  11.5* 13.1*  HGB 7.1* 7.0* 8.7* 9.2* 8.7* 8.3*  HCT 25.3* 25.2* 30.2* 27.0* 31.0* 29.5*  MCV 77.6* 76.8* 78.9*  --  81.4 80.6  PLT 226 160 134*  --  224 034   Basic Metabolic Panel: Recent Labs  Lab 10/24/18 1334 10/25/18 0618 10/26/18 0926 10/28/18 0830 10/29/18 0759  NA 136 134* 138 136 136  K 3.4* 3.7 3.8 4.6 4.7  CL 102 100  --  102 101  CO2 25 25  --  25 24  GLUCOSE 182* 189* 185* 166* 184*  BUN 43* 34*  --  52* 60*  CREATININE 4.55* 4.75*  --  5.27* 6.02*  CALCIUM 7.9* 8.3*  --  8.1* 8.2*  PHOS 3.8  --   --  5.2* 6.8*   GFR: Estimated Creatinine Clearance: 14.5 mL/min (A) (by C-G formula based on SCr of 6.02 mg/dL (H)). Liver Function Tests: Recent Labs  Lab 10/24/18 1334 10/28/18 0830 10/29/18 0759  ALBUMIN 2.2* 2.3* 2.2*   No results for input(s): LIPASE, AMYLASE in the last 168 hours. No results for input(s): AMMONIA in the last 168 hours. Coagulation Profile: Recent Labs  Lab 10/25/18 0618  INR 1.10   Cardiac Enzymes: No results for input(s): CKTOTAL, CKMB, CKMBINDEX, TROPONINI in  the last 168 hours. BNP (last 3 results) No results for input(s): PROBNP in the last 8760 hours. HbA1C: No results for input(s): HGBA1C in the last 72 hours. CBG: Recent Labs  Lab 10/29/18 1623 10/29/18 2141 10/30/18 0749 10/30/18 1026 10/30/18 1127  GLUCAP 181* 131* 124* 132* 136*   Lipid Profile: No results for input(s): CHOL, HDL, LDLCALC, TRIG, CHOLHDL, LDLDIRECT in the last 72 hours. Thyroid Function Tests: No results for input(s): TSH, T4TOTAL, FREET4, T3FREE, THYROIDAB in the last 72 hours. Anemia Panel: No results for input(s): VITAMINB12, FOLATE, FERRITIN, TIBC, IRON, RETICCTPCT in the last 72 hours. Urine analysis:    Component Value Date/Time   COLORURINE YELLOW 09/10/2018 1927   APPEARANCEUR HAZY (A) 09/10/2018 1927   LABSPEC 1.011 09/10/2018 1927   PHURINE 5.0 09/10/2018 1927   GLUCOSEU 50 (A) 09/10/2018 1927   HGBUR MODERATE (A) 09/10/2018 1927   BILIRUBINUR NEGATIVE 09/10/2018 1927   KETONESUR NEGATIVE 09/10/2018 1927   PROTEINUR 100 (A) 09/10/2018 1927   NITRITE NEGATIVE 09/10/2018 1927   LEUKOCYTESUR NEGATIVE 09/10/2018 1927   Sepsis Labs: @LABRCNTIP (procalcitonin:4,lacticidven:4)  ) Recent  Results (from the past 240 hour(s))  MRSA PCR Screening     Status: None   Collection Time: 10/23/18  9:47 AM  Result Value Ref Range Status   MRSA by PCR NEGATIVE NEGATIVE Final    Comment:        The GeneXpert MRSA Assay (FDA approved for NASAL specimens only), is one component of a comprehensive MRSA colonization surveillance program. It is not intended to diagnose MRSA infection nor to guide or monitor treatment for MRSA infections. Performed at [facility], [place] [address], [place], [place] [address]   Surgical pcr screen     Status: None   Collection Time: 10/25/18  5:44 AM  Result Value Ref Range Status   MRSA, PCR NEGATIVE NEGATIVE Final   Staphylococcus aureus NEGATIVE NEGATIVE Final    Comment: (NOTE) The Xpert SA Assay (FDA approved  for NASAL specimens in patients 8 years of age and older), is one component of a comprehensive surveillance program. It is not intended to diagnose infection nor to guide or monitor treatment. Performed at [facility], [place] [address] [place],  [address]       Studies: No results found.  Scheduled Meds: . sodium chloride   Intravenous Once  . ammonium lactate   Topical BID  . aspirin EC  81 mg Oral Daily  . atorvastatin  10 mg Oral QHS  . calcitRIOL  0.5 mcg Oral Q T,Th,Sa-HD  . carvedilol  12.5 mg Oral BID WC  . Chlorhexidine Gluconate Cloth  6 each Topical Q0600  . [START ON 11/02/2018] darbepoetin (ARANESP) injection - DIALYSIS  200 mcg Intravenous Q Sat-HD  . docusate sodium  100 mg Oral BID  . feeding supplement (PRO-STAT SUGAR FREE 64)  30 mL Oral BID  . gabapentin  300 mg Oral QHS  . heparin injection (subcutaneous)  5,000 Units Subcutaneous Q8H  . insulin aspart  0-9 Units Subcutaneous TID WC  . insulin aspart  3 Units Subcutaneous TID WC  . insulin glargine  20 Units Subcutaneous BID  . multivitamin  1 tablet Oral QHS  . pantoprazole  40 mg Oral Daily  . sevelamer carbonate  800 mg Oral TID WC    Continuous Infusions: . sodium chloride    . sodium chloride 10 mL/hr at 10/30/18 0826  .  ceFAZolin (ANCEF) IV       LOS: 8 days     Kayleen Memos, MD [facility] Pager [phone]  If 7PM-7AM, please contact night-coverage [email or web address] Password [email or web address] 10/30/2018, 4:21 PM

## 2018-10-30 NOTE — Progress Notes (Signed)
Subjective: 4 Days Post-Op Procedure(s) (LRB): AMPUTATION BELOW KNEE (Right)  Patient reports pain as mild to moderate.  Reports that pain is much improved.  Tolerating POs well.  Admits to flatus.  Denies fever, chills, N/V, CP, SOB.  Objective:   VITALS:  Temp:  [98.3 F (36.8 C)-99 F (37.2 C)] 98.3 F (36.8 C) (11/27 0447) Pulse Rate:  [72-81] 72 (11/27 0447) Resp:  [17-19] 17 (11/26 2142) BP: (101-157)/(52-80) 118/69 (11/27 0447) SpO2:  [94 %-100 %] 94 % (11/27 0447) Weight:  [89.9 kg] 89.9 kg (11/26 1201)  General: WDWN patient in NAD. Psych:  Appropriate mood and affect. Neuro:  A&O x 3, Moving all extremities, sensation intact to light touch HEENT:  EOMs intact Chest:  Even non-labored respirations Skin:  Stump protector and dressing C/D/I, no rashes or lesions Extremities: warm/dry, no visible edema, erythema or echymosis.  No lymphadenopathy. Pulses: Femoral 2+ MSK:  ROM: TKE, MMT: able to perform quad set    LABS Recent Labs    10/28/18 0830 10/29/18 0800  HGB 8.7* 8.3*  WBC 11.5* 13.1*  PLT 224 278   Recent Labs    10/28/18 0830 10/29/18 0759  NA 136 136  K 4.6 4.7  CL 102 101  CO2 25 24  BUN 52* 60*  CREATININE 5.27* 6.02*  GLUCOSE 166* 184*   No results for input(s): LABPT, INR in the last 72 hours.   Assessment/Plan: 4 Days Post-Op Procedure(s) (LRB): AMPUTATION BELOW KNEE (Right)  NWB R LE Stump protector at all times Up with therapy Plan for 2 week outpatient post-op visit with Dr. Doran Durand Ortho signing off.  Please call with any questions/concerns.   Mechele Claude PA-C [facility] Office:  [phone]

## 2018-10-31 ENCOUNTER — Encounter (HOSPITAL_COMMUNITY): Payer: Self-pay | Admitting: Surgery

## 2018-10-31 DIAGNOSIS — S88111A Complete traumatic amputation at level between knee and ankle, right lower leg, initial encounter: Secondary | ICD-10-CM

## 2018-10-31 DIAGNOSIS — R262 Difficulty in walking, not elsewhere classified: Secondary | ICD-10-CM

## 2018-10-31 DIAGNOSIS — E1151 Type 2 diabetes mellitus with diabetic peripheral angiopathy without gangrene: Secondary | ICD-10-CM

## 2018-10-31 DIAGNOSIS — Z89512 Acquired absence of left leg below knee: Secondary | ICD-10-CM

## 2018-10-31 DIAGNOSIS — N186 End stage renal disease: Secondary | ICD-10-CM

## 2018-10-31 LAB — GLUCOSE, CAPILLARY
GLUCOSE-CAPILLARY: 106 mg/dL — AB (ref 70–99)
GLUCOSE-CAPILLARY: 133 mg/dL — AB (ref 70–99)
GLUCOSE-CAPILLARY: 91 mg/dL (ref 70–99)
GLUCOSE-CAPILLARY: 157 mg/dL — AB (ref 70–99)

## 2018-10-31 MED ORDER — HYDROCODONE-ACETAMINOPHEN 5-325 MG PO TABS
1.0000 | ORAL_TABLET | Freq: Four times a day (QID) | ORAL | Status: DC | PRN
  Administered 2018-10-31 – 2018-11-01 (×4): 2 via ORAL
  Filled 2018-10-31: qty 1
  Filled 2018-10-31: qty 2
  Filled 2018-10-31: qty 1
  Filled 2018-10-31 (×2): qty 2

## 2018-10-31 MED ORDER — CLOPIDOGREL BISULFATE 75 MG PO TABS
75.0000 mg | ORAL_TABLET | Freq: Every day | ORAL | Status: DC
  Administered 2018-10-31 – 2018-11-01 (×2): 75 mg via ORAL
  Filled 2018-10-31 (×2): qty 1

## 2018-10-31 NOTE — Progress Notes (Deleted)
[facility] Progress Note   Dialysis Orders: GOC TThS 4h 50min   91.5kg  2/2 bath  180NRe 400/A2.0    RIJ TDC/Maturing L AVF  -Calcitriol 0.27mcgPO TIW -Mircera 225 mcg IV q 2 weeksNext due 10/24/18  Assessment/Plan: 1. 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 ESRD - TTS - catheter replaced 11/27- able to do HD 11/26 net UF 2.5 L - next HD Saturday -  3. Anemia - s/p 2 units PRBC 11/22 Hgb 8.3 - ARanesp 200 given 11/21- next dose scheduled for Saturday  4. Secondary hyperparathyroidism - calcitriol/renvela 5. HTN/volume - post HD wt 89.9 Tuesday - will need a lower edw for d/c- BP up some this am. On coreg 12.5 bid - titrate EDW  6. Nutrition -renal die/prostat/renavite alb 2.2- eating well 7. Disp  -evaluating for CIR - would be a great candidate.  [name], PA-C [facility] Beeper [phone] 10/31/2018,1:22 PM  LOS: 9 days   Pt seen, examined and agree w A/P as above.  [name] MD [employer] pager [phone]   10/31/2018, 1:24 PM    Subjective:   Feeling better today.  Some of grogginess yesterday was due to sedation after HD cath replacement.  Leg pain better.  Objective Vitals:   10/30/18 1814 10/30/18 2033 10/31/18 0602 10/31/18 0939  BP: 131/70 140/75 (!) 164/74 (!) 144/66  Pulse: 71 72 72 70  Resp: 16 20 14 18   Temp: 98.2 F (36.8 C) 98.9 F (37.2 C) 98.4 F (36.9 C) 98.9 F (37.2 C)  TempSrc: Oral Oral Oral Oral  SpO2: 100% 99% 97% 98%  Weight:  89.9 kg    Height:       Physical Exam General: NAD spirits good Heart: RRR Lungs: no rales Abdomen: soft NT + BS Extremities: left BKA no edema, right BKA with stump protector Dialysis Access:  Right IJ [facility] 11/27 replaced   Additional Objective Labs: Basic Metabolic Panel: Recent Labs  Lab 10/24/18 1334 10/25/18 0618 10/26/18 0926 10/28/18 0830 10/29/18 0759  NA 136 134* 138  136 136  K 3.4* 3.7 3.8 4.6 4.7  CL 102 100  --  102 101  CO2 25 25  --  25 24  GLUCOSE 182* 189* 185* 166* 184*  BUN 43* 34*  --  52* 60*  CREATININE 4.55* 4.75*  --  5.27* 6.02*  CALCIUM 7.9* 8.3*  --  8.1* 8.2*  PHOS 3.8  --   --  5.2* 6.8*   Liver Function Tests: Recent Labs  Lab 10/24/18 1334 10/28/18 0830 10/29/18 0759  ALBUMIN 2.2* 2.3* 2.2*   No results for input(s): LIPASE, AMYLASE in the last 168 hours. CBC: Recent Labs  Lab 10/25/18 0618 10/26/18 0639 10/26/18 0926 10/28/18 0830 10/29/18 0800  WBC 12.0* 12.9*  --  11.5* 13.1*  HGB 7.0* 8.7* 9.2* 8.7* 8.3*  HCT 25.2* 30.2* 27.0* 31.0* 29.5*  MCV 76.8* 78.9*  --  81.4 80.6  PLT 160 134*  --  224 278   Blood Culture    Component Value Date/Time   SDES BLOOD LEFT HAND 09/10/2018 0034   SPECREQUEST  09/10/2018 0034    BOTTLES DRAWN AEROBIC ONLY Blood Culture adequate volume   CULT  09/10/2018 0034    NO GROWTH 5 DAYS Performed at [facility], [place] [address] [place], [place] [address]    REPTSTATUS 09/15/2018 FINAL 09/10/2018 0034    Cardiac  Enzymes: No results for input(s): CKTOTAL, CKMB, CKMBINDEX, TROPONINI in the last 168 hours. CBG: Recent Labs  Lab 10/30/18 1127 10/30/18 1611 10/30/18 2030 10/31/18 0739 10/31/18 1115  GLUCAP 136* 114* 122* 106* 133*   Iron Studies: No results for input(s): IRON, TIBC, TRANSFERRIN, FERRITIN in the last 72 hours. Lab Results  Component Value Date   INR 1.10 10/25/2018   INR 1.17 03/31/2017   INR 1.09 12/18/2013   Studies/Results: No results found. Medications: . sodium chloride    . sodium chloride 10 mL/hr at 10/30/18 0826   . sodium chloride   Intravenous Once  . ammonium lactate   Topical BID  . aspirin EC  81 mg Oral Daily  . atorvastatin  10 mg Oral QHS  . calcitRIOL  0.5 mcg Oral Q T,Th,Sa-HD  . carvedilol  12.5 mg Oral BID WC  . Chlorhexidine Gluconate Cloth  6 each Topical Q0600  . [START ON 11/02/2018] darbepoetin (ARANESP)  injection - DIALYSIS  200 mcg Intravenous Q Sat-HD  . docusate sodium  100 mg Oral BID  . feeding supplement (PRO-STAT SUGAR FREE 64)  30 mL Oral BID  . gabapentin  300 mg Oral QHS  . heparin injection (subcutaneous)  5,000 Units Subcutaneous Q8H  . insulin aspart  0-9 Units Subcutaneous TID WC  . insulin aspart  3 Units Subcutaneous TID WC  . insulin glargine  20 Units Subcutaneous BID  . multivitamin  1 tablet Oral QHS  . pantoprazole  40 mg Oral Daily  . sevelamer carbonate  800 mg Oral TID WC

## 2018-10-31 NOTE — Progress Notes (Signed)
PROGRESS NOTE  [name] FTD:[id number] DOB: 1961/01/01 DOA: 10/22/2018 PCP: System, Pcp Not In  HPI/Recap of past 24 hours: 57 y.o.malewith medical history significant ofinsulin-dependent diabetes mellitus, left BKA, right metatarsal foot amputation, peripheral neuropathy, hypertension, hyperlipidemia, tobacco abuse ESRD started on dialysis 3 weeks ago, recent history of lower extremity osteomyelitis and recent amputation presenting to the hospital for evaluation of hypotension at dialysis today. Patient was sent to the ED via EMS from dialysis. Per ED report, patient completed entire treatment.When they stood him up to weigh him at dialysis he had a syncopal/near syncopal episode and became hypotensive. He was given 650 cc bolus prior to arrival.  Patient reports having pain in his right foot which started a few days after his recent hospital discharge. States some days he is unable to bear weight on that leg. States he started noticing a few days ago that the bottom of his right foot has been peeling. Denies having any fevers, chills, nausea, or vomiting. States his appetite has been reduced since his prior hospitalization. He did finish a full course of doxycycline after his recent discharge.  Patient was recently admitted from September 09, 2018 to September 20 2018.He was transitioned to hemodialysis during this hospitalization. MRI of his right stump revealed early osteomyelitis. MRSA screening positive. He was treated with 10 days of IV antibiotics including vancomycin. Vascular surgery was consulted and patient underwent revascularization of his right lower extremity in September 16, 2018. Hospital course was complicated by intermittent severe pain in his right stump. Infectious disease was consulted and recommended doxycycline for 5 days.   10/30/18: seen and examined at his bedside. Reports spasms in his legs. No other complaints. Nephrology following.POD#0 post HD  cath replacement. Needs lower edw per nephrology.  10/31/2018: Patient seen and examined at his bedside.  No acute events overnight.  He has no new complaints.  PT evaluated and recommended CIR.  CIR consult placed in.  Patient is being weaned off his IV pain medications.  Assessment/Plan: Principal Problem:   Right foot pain Active Problems:   HTN (hypertension)   Type 2 diabetes mellitus (HCC)   HLD (hyperlipidemia)   PVD (peripheral vascular disease) (HCC)   Hypokalemia   ESRD (end stage renal disease) (HCC)   Chronic anemia   Ambulatory dysfunction   GERD (gastroesophageal reflux disease)   R foot osteomyelitis s/p RBKA Surgery done on 10/26/18 IV vancomycin stopped on 10/28/2018 Stopped IV Dilaudid and increased hydrocodone on 10/31/2018  ESRD Tuesday Thursday Saturday Last HD Tuesday 10/29/18 Dialysis catheter replaced on 1127 Needs lower edw per renal Next hemodialysis on Saturday, 11/02/2018  Clotted dialysis access Post HD cath replacement   Status post dialysis catheter placement on 10/30/18 by vasc surgery  PVD post revascularization RLE 09/16/18 C/w plavix and statin  Anemia of chronic disease/iron deficiency anemia -Hemoglobin 8.2 at presentation. Remains hemodynamically stable -Continue to follow, transfuse as needed  Ambulatory dysfunction due to amputation/right foot pain -PT consulted -s/p amputation per above  Hypertension -Continue home Coreg as tolerated -BP stable at this time  Hyperlipidemia -Continue home Lipitor as tolerated, stable  Type 2 diabetes A1c 7.5 on September 10, 2018. Most recent CBG 329. -Continue home Lantus 15 units twice daily -Sliding scale insulin sensitive -glucose in the mid-150/170's to 200's -Increase lantus to 20 units -will cont pre-meal coverage with novolog 3 units  GERD -Continue PPI as tolerated -Currently stable  Ambulatory dysfunction in the setting of bilateral below the knee amputation PT  assessed and recommended CIR Fall precautions Continue physical therapy at inpatient rehab if approved  DVT prophylaxis: Heparin subQ Code Status: Full  Disposition: DC to CIR when nephrology signs off.  Consultants:   Nephrology  Orthopedic Surgery  Vascular Surgery  Procedures:   R BKA 11/23     Objective: Vitals:   10/30/18 1814 10/30/18 2033 10/31/18 0602 10/31/18 0939  BP: 131/70 140/75 (!) 164/74 (!) 144/66  Pulse: 71 72 72 70  Resp: 16 20 14 18   Temp: 98.2 F (36.8 C) 98.9 F (37.2 C) 98.4 F (36.9 C) 98.9 F (37.2 C)  TempSrc: Oral Oral Oral Oral  SpO2: 100% 99% 97% 98%  Weight:  89.9 kg    Height:        Intake/Output Summary (Last 24 hours) at 10/31/2018 1346 Last data filed at 10/31/2018 0900 Gross per 24 hour  Intake 120 ml  Output 500 ml  Net -380 ml   Filed Weights   10/29/18 1201 10/30/18 0828 10/30/18 2033  Weight: 89.9 kg 89.9 kg 89.9 kg    Exam:  . General: 57 y.o. year-old male well-developed in no acute distress.  Alert noted x3.  . Cardiovascular: Regular rate and rhythm with no rubs or gallops.  No JVD or thyromegaly noted.   Marland Kitchen Respiratory: Clear to auscultation with no wheezes or rales. Good inspiratory effort. . Abdomen: Soft nontender nondistended with normal bowel sounds x4 quadrants. . Musculoskeletal: R BKA and LBKA stump has no ulcerative lesions. No lower extremity edema.  Marland Kitchen Psychiatry: Mood is appropriate for condition and setting   Data Reviewed: CBC: Recent Labs  Lab 10/25/18 0618 10/26/18 0639 10/26/18 0926 10/28/18 0830 10/29/18 0800  WBC 12.0* 12.9*  --  11.5* 13.1*  HGB 7.0* 8.7* 9.2* 8.7* 8.3*  HCT 25.2* 30.2* 27.0* 31.0* 29.5*  MCV 76.8* 78.9*  --  81.4 80.6  PLT 160 134*  --  224 269   Basic Metabolic Panel: Recent Labs  Lab 10/25/18 0618 10/26/18 0926 10/28/18 0830 10/29/18 0759  NA 134* 138 136 136  K 3.7 3.8 4.6 4.7  CL 100  --  102 101  CO2 25  --  25 24  GLUCOSE 189* 185* 166* 184*    BUN 34*  --  52* 60*  CREATININE 4.75*  --  5.27* 6.02*  CALCIUM 8.3*  --  8.1* 8.2*  PHOS  --   --  5.2* 6.8*   GFR: Estimated Creatinine Clearance: 14.5 mL/min (A) (by C-G formula based on SCr of 6.02 mg/dL (H)). Liver Function Tests: Recent Labs  Lab 10/28/18 0830 10/29/18 0759  ALBUMIN 2.3* 2.2*   No results for input(s): LIPASE, AMYLASE in the last 168 hours. No results for input(s): AMMONIA in the last 168 hours. Coagulation Profile: Recent Labs  Lab 10/25/18 0618  INR 1.10   Cardiac Enzymes: No results for input(s): CKTOTAL, CKMB, CKMBINDEX, TROPONINI in the last 168 hours. BNP (last 3 results) No results for input(s): PROBNP in the last 8760 hours. HbA1C: No results for input(s): HGBA1C in the last 72 hours. CBG: Recent Labs  Lab 10/30/18 1127 10/30/18 1611 10/30/18 2030 10/31/18 0739 10/31/18 1115  GLUCAP 136* 114* 122* 106* 133*   Lipid Profile: No results for input(s): CHOL, HDL, LDLCALC, TRIG, CHOLHDL, LDLDIRECT in the last 72 hours. Thyroid Function Tests: No results for input(s): TSH, T4TOTAL, FREET4, T3FREE, THYROIDAB in the last 72 hours. Anemia Panel: No results for input(s): VITAMINB12, FOLATE, FERRITIN, TIBC, IRON, RETICCTPCT in the last  72 hours. Urine analysis:    Component Value Date/Time   COLORURINE YELLOW 09/10/2018 1927   APPEARANCEUR HAZY (A) 09/10/2018 1927   LABSPEC 1.011 09/10/2018 1927   PHURINE 5.0 09/10/2018 1927   GLUCOSEU 50 (A) 09/10/2018 1927   HGBUR MODERATE (A) 09/10/2018 1927   BILIRUBINUR NEGATIVE 09/10/2018 1927   KETONESUR NEGATIVE 09/10/2018 1927   PROTEINUR 100 (A) 09/10/2018 1927   NITRITE NEGATIVE 09/10/2018 1927   LEUKOCYTESUR NEGATIVE 09/10/2018 1927   Sepsis Labs: @LABRCNTIP (procalcitonin:4,lacticidven:4)  ) Recent Results (from the past 240 hour(s))  MRSA PCR Screening     Status: None   Collection Time: 10/23/18  9:47 AM  Result Value Ref Range Status   MRSA by PCR NEGATIVE NEGATIVE Final     Comment:        The GeneXpert MRSA Assay (FDA approved for NASAL specimens only), is one component of a comprehensive MRSA colonization surveillance program. It is not intended to diagnose MRSA infection nor to guide or monitor treatment for MRSA infections. Performed at [facility], [place] [address], [place], [place] [address]   Surgical pcr screen     Status: None   Collection Time: 10/25/18  5:44 AM  Result Value Ref Range Status   MRSA, PCR NEGATIVE NEGATIVE Final   Staphylococcus aureus NEGATIVE NEGATIVE Final    Comment: (NOTE) The Xpert SA Assay (FDA approved for NASAL specimens in patients 46 years of age and older), is one component of a comprehensive surveillance program. It is not intended to diagnose infection nor to guide or monitor treatment. Performed at [facility], [place] [address] [place],  [address]       Studies: No results found.  Scheduled Meds: . sodium chloride   Intravenous Once  . ammonium lactate   Topical BID  . aspirin EC  81 mg Oral Daily  . atorvastatin  10 mg Oral QHS  . calcitRIOL  0.5 mcg Oral Q T,Th,Sa-HD  . carvedilol  12.5 mg Oral BID WC  . Chlorhexidine Gluconate Cloth  6 each Topical Q0600  . [START ON 11/02/2018] darbepoetin (ARANESP) injection - DIALYSIS  200 mcg Intravenous Q Sat-HD  . docusate sodium  100 mg Oral BID  . feeding supplement (PRO-STAT SUGAR FREE 64)  30 mL Oral BID  . gabapentin  300 mg Oral QHS  . heparin injection (subcutaneous)  5,000 Units Subcutaneous Q8H  . insulin aspart  0-9 Units Subcutaneous TID WC  . insulin aspart  3 Units Subcutaneous TID WC  . insulin glargine  20 Units Subcutaneous BID  . multivitamin  1 tablet Oral QHS  . pantoprazole  40 mg Oral Daily  . sevelamer carbonate  800 mg Oral TID WC    Continuous Infusions: . sodium chloride    . sodium chloride 10 mL/hr at 10/30/18 0826     LOS: 9 days     Kayleen Memos, MD [facility] Pager [phone]  If  7PM-7AM, please contact night-coverage [email or web address] Password [email or web address] 10/31/2018, 1:46 PM

## 2018-10-31 NOTE — Progress Notes (Addendum)
[name] Progress Note   Dialysis Orders: GOC TThS 4 hrs 15 min 180NRe 400/Autoflow 2.0 91.5 kg 2.0 K/ 2.0 Ca  RIJ TDC/Maturing L AVF  -Calcitriol 0.54mcgPO TIW -Mircera 225 mcg IV q 2 weeksNext due 10/24/18  Assessment/Plan: 1. 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 ESRD - TTS - catheter replaced 11/27- able to do HD 11/26 net UF 2.5 L - next HD Saturday -  3. Anemia - s/p 2 units PRBC 11/22 Hgb 8.3 - ARanesp 200 given 11/21- next dose scheduled for Saturday  4. Secondary hyperparathyroidism - calcitriol/renvela 5. HTN/volume - post HD wt 89.9 Tuesday - will need a lower edw for d/c- BP up some this am. On coreg 12.5 bid - titrate EDW  6. Nutrition -renal die/prostat/renavite alb 2.2- eating well 7. Disp  -evaluating for CIR - would be a great candidate.  [name], PA-C [facility] Beeper [phone] 10/31/2018,7:58 AM  LOS: 9 days   Pt seen, examined and agree w A/P as above.  [name] MD [employer] pager [phone]   10/31/2018, 1:32 PM    Subjective:   Feeling better today.  Some of grogginess yesterday was due to sedation after HD cath replacement.  Leg pain better.  Objective Vitals:   10/30/18 1053 10/30/18 1814 10/30/18 2033 10/31/18 0602  BP: 123/62 131/70 140/75 (!) 164/74  Pulse:  71 72 72  Resp:  16 20 14   Temp:  98.2 F (36.8 C) 98.9 F (37.2 C) 98.4 F (36.9 C)  TempSrc:  Oral Oral Oral  SpO2:  100% 99% 97%  Weight:   89.9 kg   Height:       Physical Exam General: NAD spirits good Heart: RRR Lungs: no rales Abdomen: soft NT + BS Extremities: left BKA no edema, right BKA with stump protector Dialysis Access:  Right IJ [facility] 11/27 replaced   Additional Objective Labs: Basic Metabolic Panel: Recent Labs  Lab 10/24/18 1334 10/25/18 0618 10/26/18 0926 10/28/18 0830 10/29/18 0759  NA 136 134* 138 136 136  K 3.4* 3.7  3.8 4.6 4.7  CL 102 100  --  102 101  CO2 25 25  --  25 24  GLUCOSE 182* 189* 185* 166* 184*  BUN 43* 34*  --  52* 60*  CREATININE 4.55* 4.75*  --  5.27* 6.02*  CALCIUM 7.9* 8.3*  --  8.1* 8.2*  PHOS 3.8  --   --  5.2* 6.8*   Liver Function Tests: Recent Labs  Lab 10/24/18 1334 10/28/18 0830 10/29/18 0759  ALBUMIN 2.2* 2.3* 2.2*   No results for input(s): LIPASE, AMYLASE in the last 168 hours. CBC: Recent Labs  Lab 10/25/18 0618 10/26/18 0639 10/26/18 0926 10/28/18 0830 10/29/18 0800  WBC 12.0* 12.9*  --  11.5* 13.1*  HGB 7.0* 8.7* 9.2* 8.7* 8.3*  HCT 25.2* 30.2* 27.0* 31.0* 29.5*  MCV 76.8* 78.9*  --  81.4 80.6  PLT 160 134*  --  224 278   Blood Culture    Component Value Date/Time   SDES BLOOD LEFT HAND 09/10/2018 0034   SPECREQUEST  09/10/2018 0034    BOTTLES DRAWN AEROBIC ONLY Blood Culture adequate volume   CULT  09/10/2018 0034    NO GROWTH 5 DAYS Performed at [facility], [place] [address], [place], [place] [address]    REPTSTATUS 09/15/2018 FINAL 09/10/2018 0034    Cardiac Enzymes: No results for  input(s): CKTOTAL, CKMB, CKMBINDEX, TROPONINI in the last 168 hours. CBG: Recent Labs  Lab 10/30/18 1026 10/30/18 1127 10/30/18 1611 10/30/18 2030 10/31/18 0739  GLUCAP 132* 136* 114* 122* 106*   Iron Studies: No results for input(s): IRON, TIBC, TRANSFERRIN, FERRITIN in the last 72 hours. Lab Results  Component Value Date   INR 1.10 10/25/2018   INR 1.17 03/31/2017   INR 1.09 12/18/2013   Studies/Results: No results found. Medications: . sodium chloride    . sodium chloride 10 mL/hr at 10/30/18 0826   . sodium chloride   Intravenous Once  . ammonium lactate   Topical BID  . aspirin EC  81 mg Oral Daily  . atorvastatin  10 mg Oral QHS  . calcitRIOL  0.5 mcg Oral Q T,Th,Sa-HD  . carvedilol  12.5 mg Oral BID WC  . Chlorhexidine Gluconate Cloth  6 each Topical Q0600  . [START ON 11/02/2018] darbepoetin (ARANESP) injection - DIALYSIS  200  mcg Intravenous Q Sat-HD  . docusate sodium  100 mg Oral BID  . feeding supplement (PRO-STAT SUGAR FREE 64)  30 mL Oral BID  . gabapentin  300 mg Oral QHS  . heparin injection (subcutaneous)  5,000 Units Subcutaneous Q8H  . insulin aspart  0-9 Units Subcutaneous TID WC  . insulin aspart  3 Units Subcutaneous TID WC  . insulin glargine  20 Units Subcutaneous BID  . multivitamin  1 tablet Oral QHS  . pantoprazole  40 mg Oral Daily  . sevelamer carbonate  800 mg Oral TID WC
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
## 2018-11-01 ENCOUNTER — Encounter (HOSPITAL_COMMUNITY): Payer: Self-pay

## 2018-11-01 ENCOUNTER — Inpatient Hospital Stay (HOSPITAL_COMMUNITY)
Admission: RE | Admit: 2018-11-01 | Discharge: 2018-11-09 | DRG: 559 | Disposition: A | Discharge status: Home Health | Payer: Medicare Other | Source: Intra-hospital | Attending: Physical Medicine & Rehabilitation | Admitting: Physical Medicine & Rehabilitation

## 2018-11-01 ENCOUNTER — Other Ambulatory Visit: Payer: Self-pay

## 2018-11-01 DIAGNOSIS — Z4781 Encounter for orthopedic aftercare following surgical amputation: Secondary | ICD-10-CM | POA: Diagnosis present

## 2018-11-01 DIAGNOSIS — Z794 Long term (current) use of insulin: Secondary | ICD-10-CM

## 2018-11-01 DIAGNOSIS — K219 Gastro-esophageal reflux disease without esophagitis: Secondary | ICD-10-CM | POA: Diagnosis present

## 2018-11-01 DIAGNOSIS — D62 Acute posthemorrhagic anemia: Secondary | ICD-10-CM | POA: Diagnosis present

## 2018-11-01 DIAGNOSIS — I959 Hypotension, unspecified: Secondary | ICD-10-CM | POA: Diagnosis present

## 2018-11-01 DIAGNOSIS — I739 Peripheral vascular disease, unspecified: Secondary | ICD-10-CM

## 2018-11-01 DIAGNOSIS — E1142 Type 2 diabetes mellitus with diabetic polyneuropathy: Secondary | ICD-10-CM | POA: Diagnosis present

## 2018-11-01 DIAGNOSIS — Z7902 Long term (current) use of antithrombotics/antiplatelets: Secondary | ICD-10-CM | POA: Diagnosis not present

## 2018-11-01 DIAGNOSIS — E1151 Type 2 diabetes mellitus with diabetic peripheral angiopathy without gangrene: Secondary | ICD-10-CM | POA: Diagnosis present

## 2018-11-01 DIAGNOSIS — Z972 Presence of dental prosthetic device (complete) (partial): Secondary | ICD-10-CM | POA: Diagnosis not present

## 2018-11-01 DIAGNOSIS — E119 Type 2 diabetes mellitus without complications: Secondary | ICD-10-CM

## 2018-11-01 DIAGNOSIS — Z89512 Acquired absence of left leg below knee: Secondary | ICD-10-CM

## 2018-11-01 DIAGNOSIS — D72829 Elevated white blood cell count, unspecified: Secondary | ICD-10-CM | POA: Diagnosis not present

## 2018-11-01 DIAGNOSIS — Z7982 Long term (current) use of aspirin: Secondary | ICD-10-CM

## 2018-11-01 DIAGNOSIS — E1122 Type 2 diabetes mellitus with diabetic chronic kidney disease: Secondary | ICD-10-CM | POA: Diagnosis present

## 2018-11-01 DIAGNOSIS — G8918 Other acute postprocedural pain: Secondary | ICD-10-CM

## 2018-11-01 DIAGNOSIS — Z79899 Other long term (current) drug therapy: Secondary | ICD-10-CM | POA: Diagnosis not present

## 2018-11-01 DIAGNOSIS — Z8614 Personal history of Methicillin resistant Staphylococcus aureus infection: Secondary | ICD-10-CM

## 2018-11-01 DIAGNOSIS — S88111A Complete traumatic amputation at level between knee and ankle, right lower leg, initial encounter: Secondary | ICD-10-CM | POA: Diagnosis present

## 2018-11-01 DIAGNOSIS — N186 End stage renal disease: Secondary | ICD-10-CM | POA: Diagnosis present

## 2018-11-01 DIAGNOSIS — D638 Anemia in other chronic diseases classified elsewhere: Secondary | ICD-10-CM

## 2018-11-01 DIAGNOSIS — N2581 Secondary hyperparathyroidism of renal origin: Secondary | ICD-10-CM | POA: Diagnosis present

## 2018-11-01 DIAGNOSIS — D631 Anemia in chronic kidney disease: Secondary | ICD-10-CM | POA: Diagnosis present

## 2018-11-01 DIAGNOSIS — I120 Hypertensive chronic kidney disease with stage 5 chronic kidney disease or end stage renal disease: Secondary | ICD-10-CM | POA: Diagnosis present

## 2018-11-01 DIAGNOSIS — I10 Essential (primary) hypertension: Secondary | ICD-10-CM | POA: Diagnosis present

## 2018-11-01 DIAGNOSIS — Z87891 Personal history of nicotine dependence: Secondary | ICD-10-CM | POA: Diagnosis not present

## 2018-11-01 DIAGNOSIS — Z833 Family history of diabetes mellitus: Secondary | ICD-10-CM

## 2018-11-01 DIAGNOSIS — M792 Neuralgia and neuritis, unspecified: Secondary | ICD-10-CM

## 2018-11-01 DIAGNOSIS — Z992 Dependence on renal dialysis: Secondary | ICD-10-CM

## 2018-11-01 DIAGNOSIS — Z973 Presence of spectacles and contact lenses: Secondary | ICD-10-CM

## 2018-11-01 DIAGNOSIS — Z89511 Acquired absence of right leg below knee: Secondary | ICD-10-CM | POA: Diagnosis not present

## 2018-11-01 DIAGNOSIS — M79671 Pain in right foot: Secondary | ICD-10-CM

## 2018-11-01 DIAGNOSIS — Z8249 Family history of ischemic heart disease and other diseases of the circulatory system: Secondary | ICD-10-CM

## 2018-11-01 LAB — GLUCOSE, CAPILLARY
GLUCOSE-CAPILLARY: 167 mg/dL — AB (ref 70–99)
Glucose-Capillary: 129 mg/dL — ABNORMAL HIGH (ref 70–99)
Glucose-Capillary: 113 mg/dL — ABNORMAL HIGH (ref 70–99)
Glucose-Capillary: 163 mg/dL — ABNORMAL HIGH (ref 70–99)

## 2018-11-01 LAB — CBC WITH DIFFERENTIAL/PLATELET
ABS IMMATURE GRANULOCYTES: 0.14 10*3/uL — AB (ref 0.00–0.07)
BASOS ABS: 0.1 10*3/uL (ref 0.0–0.1)
Basophils Relative: 1 %
EOS PCT: 4 %
Eosinophils Absolute: 0.5 10*3/uL (ref 0.0–0.5)
HCT: 27.6 % — ABNORMAL LOW (ref 39.0–52.0)
HEMOGLOBIN: 7.9 g/dL — AB (ref 13.0–17.0)
Immature Granulocytes: 1 %
LYMPHS ABS: 1.9 10*3/uL (ref 0.7–4.0)
LYMPHS PCT: 16 %
MCH: 22.5 pg — ABNORMAL LOW (ref 26.0–34.0)
MCHC: 28.6 g/dL — ABNORMAL LOW (ref 30.0–36.0)
MCV: 78.6 fL — ABNORMAL LOW (ref 80.0–100.0)
Monocytes Absolute: 1.4 10*3/uL — ABNORMAL HIGH (ref 0.1–1.0)
Monocytes Relative: 12 %
NRBC: 0 % (ref 0.0–0.2)
Neutro Abs: 7.7 10*3/uL (ref 1.7–7.7)
Neutrophils Relative %: 66 %
Platelets: 333 10*3/uL (ref 150–400)
RBC: 3.51 MIL/uL — AB (ref 4.22–5.81)
RDW: 17.8 % — ABNORMAL HIGH (ref 11.5–15.5)
WBC: 11.7 10*3/uL — AB (ref 4.0–10.5)

## 2018-11-01 LAB — BASIC METABOLIC PANEL
ANION GAP: 12 (ref 5–15)
BUN: 96 mg/dL — ABNORMAL HIGH (ref 6–20)
CHLORIDE: 104 mmol/L (ref 98–111)
CO2: 22 mmol/L (ref 22–32)
Calcium: 8.5 mg/dL — ABNORMAL LOW (ref 8.9–10.3)
Creatinine, Ser: 8.09 mg/dL — ABNORMAL HIGH (ref 0.61–1.24)
GFR calc non Af Amer: 7 mL/min — ABNORMAL LOW (ref >60)
GFR, EST AFRICAN AMERICAN: 8 mL/min — AB (ref >60)
Glucose, Bld: 177 mg/dL — ABNORMAL HIGH (ref 70–99)
POTASSIUM: 4.5 mmol/L (ref 3.5–5.1)
SODIUM: 138 mmol/L (ref 135–145)

## 2018-11-01 MED ORDER — POLYETHYLENE GLYCOL 3350 17 G PO PACK: 17.0000 g | PACK | Freq: Every day | ORAL | Status: DC | PRN

## 2018-11-01 MED ORDER — INSULIN ASPART 100 UNIT/ML ~~LOC~~ SOLN
3.0000 [IU] | Freq: Three times a day (TID) | SUBCUTANEOUS | Status: DC
  Administered 2018-11-02 – 2018-11-09 (×21): 3 [IU] via SUBCUTANEOUS

## 2018-11-01 MED ORDER — CLOPIDOGREL BISULFATE 75 MG PO TABS
75.0000 mg | ORAL_TABLET | Freq: Every day | ORAL | Status: DC
  Administered 2018-11-02 – 2018-11-09 (×8): 75 mg via ORAL
  Filled 2018-11-01 (×8): qty 1

## 2018-11-01 MED ORDER — DOCUSATE SODIUM 100 MG PO CAPS
100.0000 mg | ORAL_CAPSULE | Freq: Two times a day (BID) | ORAL | Status: DC
  Administered 2018-11-01 – 2018-11-09 (×16): 100 mg via ORAL
  Filled 2018-11-01 (×16): qty 1

## 2018-11-01 MED ORDER — ALUMINUM HYDROXIDE GEL 320 MG/5ML PO SUSP
10.0000 mL | Freq: Four times a day (QID) | ORAL | Status: DC | PRN
  Filled 2018-11-01: qty 30

## 2018-11-01 MED ORDER — DARBEPOETIN ALFA 200 MCG/0.4ML IJ SOSY
200.0000 ug | PREFILLED_SYRINGE | INTRAMUSCULAR | Status: DC
  Administered 2018-11-02 – 2018-11-09 (×2): 200 ug via INTRAVENOUS
  Filled 2018-11-01 (×2): qty 0.4

## 2018-11-01 MED ORDER — INSULIN GLARGINE 100 UNIT/ML ~~LOC~~ SOLN
20.0000 [IU] | Freq: Two times a day (BID) | SUBCUTANEOUS | Status: DC
  Administered 2018-11-01 – 2018-11-03 (×4): 20 [IU] via SUBCUTANEOUS
  Filled 2018-11-01 (×4): qty 0.2

## 2018-11-01 MED ORDER — GUAIFENESIN-DM 100-10 MG/5ML PO SYRP: 5.0000 mL | ORAL_SOLUTION | Freq: Four times a day (QID) | ORAL | Status: DC | PRN

## 2018-11-01 MED ORDER — TRAMADOL HCL 50 MG PO TABS
50.0000 mg | ORAL_TABLET | Freq: Four times a day (QID) | ORAL | Status: DC | PRN
  Administered 2018-11-03 – 2018-11-09 (×12): 50 mg via ORAL
  Filled 2018-11-01 (×12): qty 1

## 2018-11-01 MED ORDER — BISACODYL 10 MG RE SUPP: 10.0000 mg | Freq: Every day | RECTAL | Status: DC | PRN

## 2018-11-01 MED ORDER — RENA-VITE PO TABS
1.0000 | ORAL_TABLET | Freq: Every day | ORAL | Status: DC
  Administered 2018-11-01 – 2018-11-08 (×8): 1 via ORAL
  Filled 2018-11-01 (×8): qty 1

## 2018-11-01 MED ORDER — SODIUM CHLORIDE 0.9 % IV SOLN: 100.0000 mL | INTRAVENOUS | Status: DC | PRN

## 2018-11-01 MED ORDER — ONDANSETRON HCL 4 MG PO TABS: 4.0000 mg | ORAL_TABLET | Freq: Four times a day (QID) | ORAL | Status: DC | PRN

## 2018-11-01 MED ORDER — SEVELAMER CARBONATE 800 MG PO TABS
800.0000 mg | ORAL_TABLET | Freq: Three times a day (TID) | ORAL | Status: DC
  Administered 2018-11-02 – 2018-11-06 (×13): 800 mg via ORAL
  Filled 2018-11-01 (×13): qty 1

## 2018-11-01 MED ORDER — ATORVASTATIN CALCIUM 10 MG PO TABS
10.0000 mg | ORAL_TABLET | Freq: Every day | ORAL | Status: DC
  Administered 2018-11-01 – 2018-11-08 (×8): 10 mg via ORAL
  Filled 2018-11-01 (×8): qty 1

## 2018-11-01 MED ORDER — HYDROCODONE-ACETAMINOPHEN 5-325 MG PO TABS
1.0000 | ORAL_TABLET | Freq: Four times a day (QID) | ORAL | Status: DC | PRN
  Administered 2018-11-01 – 2018-11-09 (×26): 2 via ORAL
  Filled 2018-11-01 (×27): qty 2

## 2018-11-01 MED ORDER — TRAZODONE HCL 50 MG PO TABS: 25.0000 mg | ORAL_TABLET | Freq: Every evening | ORAL | Status: DC | PRN

## 2018-11-01 MED ORDER — PRO-STAT SUGAR FREE PO LIQD
30.0000 mL | Freq: Two times a day (BID) | ORAL | Status: DC
  Administered 2018-11-01 – 2018-11-09 (×16): 30 mL via ORAL
  Filled 2018-11-01 (×17): qty 30

## 2018-11-01 MED ORDER — SEVELAMER CARBONATE 800 MG PO TABS: 800.0000 mg | ORAL_TABLET | Freq: Three times a day (TID) | ORAL | 0 refills | Status: DC

## 2018-11-01 MED ORDER — CHLORHEXIDINE GLUCONATE CLOTH 2 % EX PADS
6.0000 | MEDICATED_PAD | Freq: Every day | CUTANEOUS | Status: DC
  Administered 2018-11-01: 6 via TOPICAL

## 2018-11-01 MED ORDER — PROCHLORPERAZINE EDISYLATE 10 MG/2ML IJ SOLN: 5.0000 mg | Freq: Four times a day (QID) | INTRAMUSCULAR | Status: DC | PRN

## 2018-11-01 MED ORDER — CARVEDILOL 12.5 MG PO TABS
12.5000 mg | ORAL_TABLET | Freq: Two times a day (BID) | ORAL | Status: DC
  Administered 2018-11-02 – 2018-11-09 (×15): 12.5 mg via ORAL
  Filled 2018-11-01 (×15): qty 1

## 2018-11-01 MED ORDER — INSULIN ASPART 100 UNIT/ML ~~LOC~~ SOLN
0.0000 [IU] | Freq: Three times a day (TID) | SUBCUTANEOUS | Status: DC
  Administered 2018-11-03: 2 [IU] via SUBCUTANEOUS
  Administered 2018-11-03: 3 [IU] via SUBCUTANEOUS
  Administered 2018-11-04: 1 [IU] via SUBCUTANEOUS
  Administered 2018-11-04 – 2018-11-05 (×3): 2 [IU] via SUBCUTANEOUS
  Administered 2018-11-05 – 2018-11-06 (×5): 1 [IU] via SUBCUTANEOUS
  Administered 2018-11-07: 2 [IU] via SUBCUTANEOUS
  Administered 2018-11-07: 1 [IU] via SUBCUTANEOUS
  Administered 2018-11-08: 3 [IU] via SUBCUTANEOUS
  Administered 2018-11-08 – 2018-11-09 (×2): 1 [IU] via SUBCUTANEOUS
  Administered 2018-11-09: 2 [IU] via SUBCUTANEOUS

## 2018-11-01 MED ORDER — GABAPENTIN 300 MG PO CAPS
300.0000 mg | ORAL_CAPSULE | Freq: Every day | ORAL | Status: DC
  Administered 2018-11-01 – 2018-11-08 (×8): 300 mg via ORAL
  Filled 2018-11-01 (×8): qty 1

## 2018-11-01 MED ORDER — PANTOPRAZOLE SODIUM 40 MG PO TBEC
40.0000 mg | DELAYED_RELEASE_TABLET | Freq: Every day | ORAL | Status: DC
  Administered 2018-11-02 – 2018-11-09 (×8): 40 mg via ORAL
  Filled 2018-11-01 (×8): qty 1

## 2018-11-01 MED ORDER — CHLORHEXIDINE GLUCONATE CLOTH 2 % EX PADS
6.0000 | MEDICATED_PAD | Freq: Every day | CUTANEOUS | Status: DC
  Administered 2018-11-02 – 2018-11-09 (×6): 6 via TOPICAL

## 2018-11-01 MED ORDER — ASPIRIN EC 81 MG PO TBEC
81.0000 mg | DELAYED_RELEASE_TABLET | Freq: Every day | ORAL | Status: DC
  Administered 2018-11-02 – 2018-11-09 (×8): 81 mg via ORAL
  Filled 2018-11-01 (×8): qty 1

## 2018-11-01 MED ORDER — PROCHLORPERAZINE 25 MG RE SUPP: 12.5000 mg | Freq: Four times a day (QID) | RECTAL | Status: DC | PRN

## 2018-11-01 MED ORDER — ACETAMINOPHEN 325 MG PO TABS: 325.0000 mg | ORAL_TABLET | ORAL | Status: DC | PRN

## 2018-11-01 MED ORDER — HEPARIN SODIUM (PORCINE) 5000 UNIT/ML IJ SOLN
5000.0000 [IU] | Freq: Three times a day (TID) | INTRAMUSCULAR | Status: DC
  Administered 2018-11-01 – 2018-11-09 (×21): 5000 [IU] via SUBCUTANEOUS
  Filled 2018-11-01 (×21): qty 1

## 2018-11-01 MED ORDER — CALCITRIOL 0.5 MCG PO CAPS
0.5000 ug | ORAL_CAPSULE | ORAL | Status: DC
  Administered 2018-11-02 – 2018-11-09 (×4): 0.5 ug via ORAL
  Filled 2018-11-01 (×3): qty 1

## 2018-11-01 MED ORDER — CALCITRIOL 0.5 MCG PO CAPS: 0.5000 ug | ORAL_CAPSULE | ORAL | 0 refills | Status: DC

## 2018-11-01 MED ORDER — DIPHENHYDRAMINE HCL 12.5 MG/5ML PO ELIX: 12.5000 mg | ORAL_SOLUTION | Freq: Four times a day (QID) | ORAL | Status: DC | PRN

## 2018-11-01 MED ORDER — ONDANSETRON HCL 4 MG/2ML IJ SOLN: 4.0000 mg | Freq: Four times a day (QID) | INTRAMUSCULAR | Status: DC | PRN

## 2018-11-01 MED ORDER — PROCHLORPERAZINE MALEATE 5 MG PO TABS: 5.0000 mg | ORAL_TABLET | Freq: Four times a day (QID) | ORAL | Status: DC | PRN

## 2018-11-01 NOTE — Progress Notes (Addendum)
[facility] Progress Note   Dialysis Orders: GOC TThS 4 hrs 15 min 180NRe 400/Autoflow 2.0 91.5 kg 2.0 K/ 2.0 Ca  RIJ TDC/Maturing L AVF  -Calcitriol 0.13mcgPO TIW -Mircera 225 mcg IV q 2 weeksNext due 10/24/18  Assessment/Plan: 1.Right foot Osteo/PAD- s/p right BKA 11/23 - Vanc d/c 11/25- had prior revascularization 09/16/18 2. ESRD- TTS - catheter replaced 11/27- able to do HD 11/26 net UF 2.5 L - next HD Saturday K 4.5  3. Anemia- s/p 2 units PRBC 11/22 Hgb 7.9 - ARanesp 200 given 11/21- next dose scheduled for Saturday 4. Secondary hyperparathyroidism- calcitriol/renvela 5.HTN/volume- post HD wt 89.9 Tuesday- will need a lower edw for d/c- BP up some . On coreg 12.5 bid - titrate EDW down more  6. Nutrition-renal die/prostat/renavite alb 2.2- eating well 7. Disp-evaluating for CIR - would be a great candidate - he thinks maybe for transfer Monday.  [name], PA-C [place] 11/01/2018,9:47 AM  LOS: 10 days   Pt seen, examined and agree w A/P as above.  [name] MD [employer] pager [phone]   11/01/2018, 2:45 PM    Subjective:   Didn't eat much breakfast -still full from food brought in to him for Thanksgiving.  Sill with right leg pain.  Objective Vitals:   10/31/18 1625 10/31/18 2029 11/01/18 0454 11/01/18 0900  BP: (!) 141/81 (!) 157/73 (!) 166/83 (!) 150/70  Pulse: 71 72 78 81  Resp: 19 20 20  (!) 21  Temp: 98.1 F (36.7 C) 98.8 F (37.1 C) 98.4 F (36.9 C)   TempSrc: Oral Oral Oral Oral  SpO2: 98% 98% 98% 98%  Weight:      Height:       Physical Exam General: NAD breathing easily Heart: RRR Lungs: no rales  Abdomen:soft NT Extremities: bilateral BKA - no LE edema on left - right BKA in stump protectore Dialysis Access:  Right IJ TDC replaced 11/27   Additional Objective Labs: Basic Metabolic Panel: Recent Labs  Lab 10/28/18 0830 10/29/18 0759  11/01/18 0658  NA 136 136 138  K 4.6 4.7 4.5  CL 102 101 104  CO2 25 24 22   GLUCOSE 166* 184* 177*  BUN 52* 60* 96*  CREATININE 5.27* 6.02* 8.09*  CALCIUM 8.1* 8.2* 8.5*  PHOS 5.2* 6.8*  --    Liver Function Tests: Recent Labs  Lab 10/28/18 0830 10/29/18 0759  ALBUMIN 2.3* 2.2*   No results for input(s): LIPASE, AMYLASE in the last 168 hours. CBC: Recent Labs  Lab 10/26/18 0639  10/28/18 0830 10/29/18 0800 11/01/18 0658  WBC 12.9*  --  11.5* 13.1* 11.7*  NEUTROABS  --   --   --   --  7.7  HGB 8.7*   < > 8.7* 8.3* 7.9*  HCT 30.2*   < > 31.0* 29.5* 27.6*  MCV 78.9*  --  81.4 80.6 78.6*  PLT 134*  --  224 278 333   < > = values in this interval not displayed.   Blood Culture    Component Value Date/Time   SDES BLOOD LEFT HAND 09/10/2018 0034   SPECREQUEST  09/10/2018 0034    BOTTLES DRAWN AEROBIC ONLY Blood Culture adequate volume   CULT  09/10/2018 0034    NO GROWTH 5 DAYS Performed at [facility], [place] [address], [place], [place] [address]    REPTSTATUS 09/15/2018 FINAL 09/10/2018 0034    Cardiac Enzymes: No results for input(s): CKTOTAL, CKMB,  CKMBINDEX, TROPONINI in the last 168 hours. CBG: Recent Labs  Lab 10/31/18 0739 10/31/18 1115 10/31/18 1623 10/31/18 2129 11/01/18 0735  GLUCAP 106* 133* 91 157* 167*   Iron Studies: No results for input(s): IRON, TIBC, TRANSFERRIN, FERRITIN in the last 72 hours. Lab Results  Component Value Date   INR 1.10 10/25/2018   INR 1.17 03/31/2017   INR 1.09 12/18/2013   Studies/Results: No results found. Medications: . sodium chloride    . sodium chloride 10 mL/hr at 10/30/18 0826   . sodium chloride   Intravenous Once  . ammonium lactate   Topical BID  . aspirin EC  81 mg Oral Daily  . atorvastatin  10 mg Oral QHS  . calcitRIOL  0.5 mcg Oral Q T,Th,Sa-HD  . carvedilol  12.5 mg Oral BID WC  . Chlorhexidine Gluconate Cloth  6 each Topical Q0600  . clopidogrel  75 mg Oral Daily  . [START ON  11/02/2018] darbepoetin (ARANESP) injection - DIALYSIS  200 mcg Intravenous Q Sat-HD  . docusate sodium  100 mg Oral BID  . feeding supplement (PRO-STAT SUGAR FREE 64)  30 mL Oral BID  . gabapentin  300 mg Oral QHS  . heparin injection (subcutaneous)  5,000 Units Subcutaneous Q8H  . insulin aspart  0-9 Units Subcutaneous TID WC  . insulin aspart  3 Units Subcutaneous TID WC  . insulin glargine  20 Units Subcutaneous BID  . multivitamin  1 tablet Oral QHS  . pantoprazole  40 mg Oral Daily  . sevelamer carbonate  800 mg Oral TID WC

## 2018-11-01 NOTE — Progress Notes (Signed)
Patient transferred by RN's from [facility]. AVS in chart and patient belongings brought with patient. Meds given before transfer. Report given to RN on 4W rehab.   Farley Ly RN

## 2018-11-01 NOTE — Progress Notes (Signed)
Inpatient Rehabilitation-Admissions Coordinator   [facility] spoke with [facility] personnel, [place], via phone to communicate that the pt has now been admitted to CIR. Charge RN unavailable at time of call, so message was communicated to [employer].   Jhonnie Garner, OTR/L  Rehab Admissions Coordinator  [phone] 11/01/2018 7:08 PM
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
## 2018-11-01 NOTE — Progress Notes (Signed)
PROGRESS NOTE  DETROIT FRIEDEN KNL:[id number] DOB: 07/23/1961 DOA: 10/22/2018 PCP: System, Pcp Not In  HPI/Recap of past 24 hours: 57 y.o.malewith medical history significant ofinsulin-dependent diabetes mellitus, left BKA, right metatarsal foot amputation, peripheral neuropathy, hypertension, hyperlipidemia, tobacco abuse ESRD started on dialysis 3 weeks ago, recent history of lower extremity osteomyelitis and recent amputation presenting to the hospital for evaluation of hypotension at dialysis today. Patient was sent to the ED via EMS from dialysis. Per ED report, patient completed entire treatment.When they stood him up to weigh him at dialysis he had a syncopal/near syncopal episode and became hypotensive. He was given 650 cc bolus prior to arrival.  Patient reports having pain in his right foot which started a few days after his recent hospital discharge. States some days he is unable to bear weight on that leg. States he started noticing a few days ago that the bottom of his right foot has been peeling. Denies having any fevers, chills, nausea, or vomiting. States his appetite has been reduced since his prior hospitalization. He did finish a full course of doxycycline after his recent discharge.  Patient was recently admitted from September 09, 2018 to September 20 2018.He was transitioned to hemodialysis during this hospitalization. MRI of his right stump revealed early osteomyelitis. MRSA screening positive. He was treated with 10 days of IV antibiotics including vancomycin. Vascular surgery was consulted and patient underwent revascularization of his right lower extremity in September 16, 2018. Hospital course was complicated by intermittent severe pain in his right stump. Infectious disease was consulted and recommended doxycycline for 5 days.  Hospital course I clotted dialysis catheter POD hemodialysis catheter placement on 10/30/2018 by vascular surgery.  PT assessed and  recommended CIR.  11/01/2018: Patient seen and examined at his bedside.  No acute events overnight.  Has no new complaints.  He is tolerating his p.o. pain medications well.  Awaiting insurance authorization for CIR.  Assessment/Plan: Principal Problem:   Right foot pain Active Problems:   HTN (hypertension)   Type 2 diabetes mellitus (HCC)   HLD (hyperlipidemia)   PVD (peripheral vascular disease) (HCC)   Hypokalemia   ESRD (end stage renal disease) (HCC)   Chronic anemia   Ambulatory dysfunction   GERD (gastroesophageal reflux disease)   R foot osteomyelitis s/p RBKA Surgery done on 10/26/18 IV vancomycin stopped on 10/28/2018 Stopped IV Dilaudid and increased hydrocodone on 10/31/2018 Tolerating his oral pain medications well Bowel regimen to avoid opiate-induced constipation  ESRD Tuesday Thursday Saturday Last HD Tuesday 10/29/18 Dialysis catheter replaced on 11/27 by vascular surgery Needs lower edw per renal Next hemodialysis on Saturday, 11/02/2018  Clotted dialysis access Post HD cath replacement   Status post dialysis catheter placement on 10/30/18 by vasc surgery  PVD post revascularization RLE 09/16/18 C/w plavix and statin Okay to restart Plavix per orthopedic surgery Plavix restarted on 10/31/2018  Anemia of chronic disease/iron deficiency anemia -Hemoglobin dropped to 7.9 from 8.2 at presentation. Remains hemodynamically stable -Continue to follow, transfuse as needed -Defer to nephrology for iron supplement and RBC stimulating agents  Ambulatory dysfunction due to amputation/right foot pain -PT consulted and recommended CIR -s/p amputation per above  Hypertension -Continue home Coreg as tolerated -BP stable at this time  Hyperlipidemia -Continue home Lipitor as tolerated, stable  Type 2 diabetes A1c 7.5 on September 10, 2018. Most recent CBG 329. -Continue home Lantus 15 units twice daily -Sliding scale insulin sensitive -glucose in the  mid-150/170's to 200's -Increase lantus to 20  units -will cont pre-meal coverage with novolog 3 units  GERD -Continue PPI as tolerated -Currently stable  Ambulatory dysfunction in the setting of bilateral below the knee amputation PT assessed and recommended CIR Fall precautions Continue physical therapy at inpatient rehab if approved  DVT prophylaxis: Heparin subQ Code Status: Full  Disposition: DC to CIR when nephrology signs off and when insurance authorizes CIR admission.  Consultants:   Nephrology  Orthopedic Surgery  Vascular Surgery  Inpatient rehab  Procedures:   R BKA 11/23     Objective: Vitals:   10/31/18 1625 10/31/18 2029 11/01/18 0454 11/01/18 0900  BP: (!) 141/81 (!) 157/73 (!) 166/83 (!) 150/70  Pulse: 71 72 78 81  Resp: 19 20 20  (!) 21  Temp: 98.1 F (36.7 C) 98.8 F (37.1 C) 98.4 F (36.9 C)   TempSrc: Oral Oral Oral Oral  SpO2: 98% 98% 98% 98%  Weight:      Height:        Intake/Output Summary (Last 24 hours) at 11/01/2018 1612 Last data filed at 11/01/2018 1345 Gross per 24 hour  Intake 915.33 ml  Output -  Net 915.33 ml   Filed Weights   10/29/18 1201 10/30/18 0828 10/30/18 2033  Weight: 89.9 kg 89.9 kg 89.9 kg    Exam:  . General: 57 y.o. year-old male well-developed well-nourished in no acute distress.  Alert oriented x3. . Cardiovascular: Regular rate and rhythm with no rubs or gallops.  No JVD or thyromegaly noted.   Marland Kitchen Respiratory: Clear to auscultation with no wheezes or rales.  Good inspiratory effort. . Abdomen: Soft nontender nondistended with normal bowel sounds x4 quadrants. . Musculoskeletal: R BKA and LBKA stump has no ulcerative lesions. No lower extremity edema.  Marland Kitchen Psychiatry: Mood is appropriate for condition and setting   Data Reviewed: CBC: Recent Labs  Lab 10/26/18 0639 10/26/18 0926 10/28/18 0830 10/29/18 0800 11/01/18 0658  WBC 12.9*  --  11.5* 13.1* 11.7*  NEUTROABS  --   --   --   --  7.7    HGB 8.7* 9.2* 8.7* 8.3* 7.9*  HCT 30.2* 27.0* 31.0* 29.5* 27.6*  MCV 78.9*  --  81.4 80.6 78.6*  PLT 134*  --  224 278 518   Basic Metabolic Panel: Recent Labs  Lab 10/26/18 0926 10/28/18 0830 10/29/18 0759 11/01/18 0658  NA 138 136 136 138  K 3.8 4.6 4.7 4.5  CL  --  102 101 104  CO2  --  25 24 22   GLUCOSE 185* 166* 184* 177*  BUN  --  52* 60* 96*  CREATININE  --  5.27* 6.02* 8.09*  CALCIUM  --  8.1* 8.2* 8.5*  PHOS  --  5.2* 6.8*  --    GFR: Estimated Creatinine Clearance: 10.8 mL/min (A) (by C-G formula based on SCr of 8.09 mg/dL (H)). Liver Function Tests: Recent Labs  Lab 10/28/18 0830 10/29/18 0759  ALBUMIN 2.3* 2.2*   No results for input(s): LIPASE, AMYLASE in the last 168 hours. No results for input(s): AMMONIA in the last 168 hours. Coagulation Profile: No results for input(s): INR, PROTIME in the last 168 hours. Cardiac Enzymes: No results for input(s): CKTOTAL, CKMB, CKMBINDEX, TROPONINI in the last 168 hours. BNP (last 3 results) No results for input(s): PROBNP in the last 8760 hours. HbA1C: No results for input(s): HGBA1C in the last 72 hours. CBG: Recent Labs  Lab 10/31/18 1115 10/31/18 1623 10/31/18 2129 11/01/18 0735 11/01/18 1053  GLUCAP 133* 91 157*  167* 129*   Lipid Profile: No results for input(s): CHOL, HDL, LDLCALC, TRIG, CHOLHDL, LDLDIRECT in the last 72 hours. Thyroid Function Tests: No results for input(s): TSH, T4TOTAL, FREET4, T3FREE, THYROIDAB in the last 72 hours. Anemia Panel: No results for input(s): VITAMINB12, FOLATE, FERRITIN, TIBC, IRON, RETICCTPCT in the last 72 hours. Urine analysis:    Component Value Date/Time   COLORURINE YELLOW 09/10/2018 1927   APPEARANCEUR HAZY (A) 09/10/2018 1927   LABSPEC 1.011 09/10/2018 1927   PHURINE 5.0 09/10/2018 1927   GLUCOSEU 50 (A) 09/10/2018 1927   HGBUR MODERATE (A) 09/10/2018 1927   BILIRUBINUR NEGATIVE 09/10/2018 1927   KETONESUR NEGATIVE 09/10/2018 1927   PROTEINUR 100  (A) 09/10/2018 1927   NITRITE NEGATIVE 09/10/2018 1927   LEUKOCYTESUR NEGATIVE 09/10/2018 1927   Sepsis Labs: @LABRCNTIP (procalcitonin:4,lacticidven:4)  ) Recent Results (from the past 240 hour(s))  MRSA PCR Screening     Status: None   Collection Time: 10/23/18  9:47 AM  Result Value Ref Range Status   MRSA by PCR NEGATIVE NEGATIVE Final    Comment:        The GeneXpert MRSA Assay (FDA approved for NASAL specimens only), is one component of a comprehensive MRSA colonization surveillance program. It is not intended to diagnose MRSA infection nor to guide or monitor treatment for MRSA infections. Performed at [facility], [place] [address] [place], [place] [address]   Surgical pcr screen     Status: None   Collection Time: 10/25/18  5:44 AM  Result Value Ref Range Status   MRSA, PCR NEGATIVE NEGATIVE Final   Staphylococcus aureus NEGATIVE NEGATIVE Final    Comment: (NOTE) The Xpert SA Assay (FDA approved for NASAL specimens in patients 9 years of age and older), is one component of a comprehensive surveillance program. It is not intended to diagnose infection nor to guide or monitor treatment. Performed at [facility], [place] [address], [place], [place] [address]       Studies: No results found.  Scheduled Meds: . sodium chloride   Intravenous Once  . ammonium lactate   Topical BID  . aspirin EC  81 mg Oral Daily  . atorvastatin  10 mg Oral QHS  . calcitRIOL  0.5 mcg Oral Q T,Th,Sa-HD  . carvedilol  12.5 mg Oral BID WC  . Chlorhexidine Gluconate Cloth  6 each Topical Q0600  . clopidogrel  75 mg Oral Daily  . [START ON 11/02/2018] darbepoetin (ARANESP) injection - DIALYSIS  200 mcg Intravenous Q Sat-HD  . docusate sodium  100 mg Oral BID  . feeding supplement (PRO-STAT SUGAR FREE 64)  30 mL Oral BID  . gabapentin  300 mg Oral QHS  . heparin injection (subcutaneous)  5,000 Units Subcutaneous Q8H  . insulin aspart  0-9 Units Subcutaneous TID WC  .  insulin aspart  3 Units Subcutaneous TID WC  . insulin glargine  20 Units Subcutaneous BID  . multivitamin  1 tablet Oral QHS  . pantoprazole  40 mg Oral Daily  . sevelamer carbonate  800 mg Oral TID WC    Continuous Infusions: . sodium chloride    . sodium chloride 10 mL/hr at 10/30/18 0826     LOS: 10 days     Kayleen Memos, MD [facility] Pager [phone]  If 7PM-7AM, please contact night-coverage [email or web address] Password [email or web address] 11/01/2018, 4:12 PM

## 2018-11-01 NOTE — Progress Notes (Signed)
RN brought patient down from 57M, oriented to unit operations and in bed with no complaints. Verbalized understanding to call for assistance.

## 2018-11-01 NOTE — Discharge Summary (Signed)
Discharge Summary  [name]:[id number] DOB: 11-26-1961  PCP: System, Pcp Not In  Admit date: 10/22/2018 Discharge date: 11/01/2018  Time spent: 35 minutes  Recommendations for Outpatient Follow-up:  1. Continue physical therapy at [facility] 2. Follow-up with orthopedic surgery posthospitalization   Discharge Diagnoses:  Active Hospital Problems   Diagnosis Date Noted  . Right foot pain 10/22/2018  . PVD (peripheral vascular disease) ([place]) 10/23/2018  . Hypokalemia 10/23/2018  . ESRD (end stage renal disease) ([place]) 10/23/2018  . Chronic anemia 10/23/2018  . Ambulatory dysfunction 10/23/2018  . GERD (gastroesophageal reflux disease) 10/23/2018  . HLD (hyperlipidemia)   . Type 2 diabetes mellitus ([place])   . HTN (hypertension) 03/30/2017    Resolved Hospital Problems  No resolved problems to display.    Discharge Condition: Stable  Diet recommendation: Resume renal dialysis diet  Vitals:   11/01/18 0454 11/01/18 0900  BP: (!) 166/83 (!) 150/70  Pulse: 78 81  Resp: 20 (!) 21  Temp: 98.4 F (36.9 C)   SpO2: 98% 98%    History of present illness:   57 y.o.malewith medical history significant ofinsulin-dependent diabetes mellitus, left BKA, right metatarsal foot amputation, peripheral neuropathy, hypertension, hyperlipidemia, tobacco abuse ESRD started on dialysis 3 weeks ago, recent history of lower extremity osteomyelitis and recent amputation presenting to the hospital for evaluation of hypotension at dialysis today. Patient was sent to the ED via EMS from dialysis. Per ED report, patient completed entire treatment.When they stood him up to weigh him at dialysis he had a syncopal/near syncopal episode and became hypotensive. He was given 650 cc bolus prior to arrival.  Patient reports having pain in his right foot which started a few days after his recent hospital discharge. States some days he is unable to bear weight on that leg. States he started  noticing a few days ago that the bottom of his right foot has been peeling. Denies having any fevers, chills, nausea, or vomiting. States his appetite has been reduced since his prior hospitalization. He did finish a full course of doxycycline after his recent discharge.  Patient was recently admitted from September 09, 2018 to September 20 2018.He was transitioned to hemodialysis during this hospitalization. MRI of his right stump revealed early osteomyelitis. MRSA screening positive. He was treated with 10 days of IV antibiotics including vancomycin. Vascular surgery was consulted and patient underwent revascularization of his right lower extremity in September 16, 2018. Hospital course was complicated by intermittent severe pain in his right stump. Infectious disease was consulted and recommended doxycycline for 5 days.  Hospital course I clotted dialysis catheter POD hemodialysis catheter placement on 10/30/2018 by vascular surgery.  PT assessed and recommended CIR.  11/01/2018: Patient seen and examined at his bedside.  No acute events overnight.  Has no new complaints.  He is tolerating his p.o. pain medications well.  Awaiting insurance authorization for CIR.  Has a bed placement.  Stable on the day of discharge to CIR.   Hospital Course:  Principal Problem:   Right foot pain Active Problems:   HTN (hypertension)   Type 2 diabetes mellitus ([facility])   HLD (hyperlipidemia)   PVD (peripheral vascular disease) ([facility])   Hypokalemia   ESRD (end stage renal disease) ([facility])   Chronic anemia   Ambulatory dysfunction   GERD (gastroesophageal reflux disease)  R foot osteomyelitis s/p RBKA Surgery done on 10/26/18 IV vancomycin stopped on 10/28/2018 Stopped IV Dilaudid and increased hydrocodone on 10/31/2018 Tolerating his oral pain medications well  Bowel regimen to avoid opiate-induced constipation  ESRD Tuesday Thursday Saturday Last HD Tuesday 10/29/18 Dialysis catheter replaced  on 11/27 by vascular surgery Needs lower edw per renal Next hemodialysis on Saturday, 11/02/2018  Clotted dialysis access Post HD cath replacement   Status post dialysis catheter placement on 10/30/18 by vasc surgery  PVD post revascularization RLE 09/16/18 C/w plavix and statin Okay to restart Plavix per orthopedic surgery Plavix restarted on 10/31/2018  Anemia of chronic disease/iron deficiency anemia -Hemoglobin dropped to 7.9 from 8.2 at presentation. Remains hemodynamically stable -Continue to follow, transfuse as needed -Defer to nephrology for iron supplement and RBC stimulating agents  Ambulatory dysfunction due to amputation/right foot pain -PT consulted and recommended CIR -s/p amputation per above  Hypertension -Continue home Coreg as tolerated -BP stable at this time  Hyperlipidemia -Continue home Lipitor as tolerated, stable  Type 2 diabetes A1c 7.5 on September 10, 2018. Most recent CBG 329. -Continue home Lantus 15 units twice daily -Sliding scale insulin sensitive -glucose in the mid-150/170's to 200's -Increase lantus to 20 units -willcontpre-meal coverage with novolog 3 units  GERD -Continue PPI as tolerated -Currently stable  Ambulatory dysfunction in the setting of bilateral below the knee amputation PT assessed and recommended CIR Fall precautions Continue physical therapy at inpatient rehab if approved  DVT prophylaxis:Heparin subQ Code Status:Full  Disposition: DC to CIR  Consultants:  Nephrology  Orthopedic Surgery  Vascular Surgery  Inpatient rehab  Procedures:  R BKA 11/23     Discharge Exam: BP (!) 150/70 (BP Location: Right Arm)   Pulse 81   Temp 98.4 F (36.9 C) (Oral)   Resp (!) 21   Ht 6' (1.829 m)   Wt 89.9 kg   SpO2 98%   BMI 26.88 kg/m   General: 57 y.o. year-old male well-developed well-nourished in no acute distress.  Alert oriented x3.  Cardiovascular: Regular rate and rhythm with  no rubs or gallops.  No JVD or thyromegaly noted.    Respiratory: Clear to auscultation with no wheezes or rales.  Good inspiratory effort.  Abdomen: Soft nontender nondistended with normal bowel sounds x4 quadrants.  Musculoskeletal: R BKA and LBKA stump has no ulcerative lesions. No lower extremity edema.   Psychiatry: Mood is appropriate for condition and setting.  Discharge Instructions You were cared for by a hospitalist during your hospital stay. If you have any questions about your discharge medications or the care you received while you were in the hospital after you are discharged, you can call the unit and asked to speak with the hospitalist on call if the hospitalist that took care of you is not available. Once you are discharged, your primary care physician will handle any further medical issues. Please note that NO REFILLS for any discharge medications will be authorized once you are discharged, as it is imperative that you return to your primary care physician (or establish a relationship with a primary care physician if you do not have one) for your aftercare needs so that they can reassess your need for medications and monitor your lab values.   Allergies as of 11/01/2018   No Known Allergies     Medication List    TAKE these medications   amLODipine 10 MG tablet Commonly known as:  NORVASC Take 1 tablet (10 mg total) by mouth at bedtime.   aspirin 81 MG EC tablet Take 1 tablet (81 mg total) by mouth daily.   atorvastatin 10 MG tablet Commonly known as:  LIPITOR Take  1 tablet (10 mg total) by mouth at bedtime.   calcitRIOL 0.5 MCG capsule Commonly known as:  ROCALTROL Take 1 capsule (0.5 mcg total) by mouth Every Tuesday,Thursday,and Saturday with dialysis.   carvedilol 12.5 MG tablet Commonly known as:  COREG Take 1 tablet (12.5 mg total) by mouth 2 (two) times daily with a meal.   clopidogrel 75 MG tablet Commonly known as:  PLAVIX Take 1 tablet (75 mg  total) by mouth daily.   feeding supplement (NEPRO CARB STEADY) Liqd Take 237 mLs by mouth 2 (two) times daily between meals.   gabapentin 300 MG capsule Commonly known as:  NEURONTIN Take 1 capsule (300 mg total) by mouth at bedtime.   insulin aspart 100 UNIT/ML injection Commonly known as:  novoLOG Inject 0-9 Units into the skin 3 (three) times daily with meals.   insulin glargine 100 UNIT/ML injection Commonly known as:  LANTUS Inject 0.1 mLs (10 Units total) into the skin 2 (two) times daily. What changed:  how much to take   multivitamin Tabs tablet Take 1 tablet by mouth at bedtime.   oxyCODONE 5 MG immediate release tablet Commonly known as:  Oxy IR/ROXICODONE Take 1 tablet (5 mg total) by mouth every 6 (six) hours as needed for moderate pain or severe pain. For no more than 3 days.   pantoprazole 40 MG tablet Commonly known as:  PROTONIX Take 1 tablet (40 mg total) by mouth daily.   polyethylene glycol packet Commonly known as:  MIRALAX / GLYCOLAX Take 17 g by mouth daily.   sevelamer carbonate 800 MG tablet Commonly known as:  RENVELA Take 1 tablet (800 mg total) by mouth 3 (three) times daily with meals.      No Known Allergies Follow-up Information    [name], MD. Schedule an appointment as soon as possible for a visit in 2 week(s).   Specialty:  Orthopedic Surgery Contact information: [address] [place] [address] [place] [address] [phone]            The results of significant diagnostics from this hospitalization (including imaging, microbiology, ancillary and laboratory) are listed below for reference.    Significant Diagnostic Studies: Dg Chest 2 View  Result Date: 10/22/2018 CLINICAL DATA:  Near syncope. History of diabetes and chronic kidney disease. EXAM: CHEST - 2 VIEW COMPARISON:  Chest radiograph September 13, 2018 FINDINGS: Cardiomediastinal silhouette is normal. No pleural effusions or focal consolidations. Trachea  projects midline and there is no pneumothorax. Tunneled dialysis catheter via RIGHT internal jugular venous approach with distal tip projecting distal superior vena cava. Soft tissue planes and included osseous structures are non-suspicious. IMPRESSION: No acute cardiopulmonary process. Electronically Signed   By: Elon Alas M.D.   On: 10/22/2018 20:56   Mr Foot Right Wo Contrast  Result Date: 10/23/2018 CLINICAL DATA:  Wound infection. Status post amputation with open wound. EXAM: MRI OF THE RIGHT FOREFOOT WITHOUT CONTRAST TECHNIQUE: Multiplanar, multisequence MR imaging of the right foot was performed. No intravenous contrast was administered. COMPARISON:  Radiographs 10/22/2018 and prior MRI 09/11/2018 FINDINGS: Bones/Joint/Cartilage There is an open wound involving the plantar aspect of the foot at the amputation site. Focal cellulitis without discrete drainable soft tissue abscess. Gas is noted in the soft tissues. There is also abnormal T1 and T2 signal intensity in the base of the fifth metatarsal and also in the base of the fourth metatarsal consistent with osteomyelitis. No other areas of osteomyelitis are identified. No findings to suggest septic arthritis. Mild  myositis involving the short flexor muscles of the foot. Chronic Achilles tendinopathy. Plantar fascia is intact. IMPRESSION: 1. Open wound on the plantar and lateral aspect of the midfoot with cellulitis and myositis but no discrete drainable soft tissue abscess. 2. MR findings consistent with osteomyelitis involving the bases of the fourth and fifth metatarsals. 3. Chronic Achilles tendinopathy. Electronically Signed   By: Marijo Sanes M.D.   On: 10/23/2018 07:38   Dg Chest Port 1 View  Result Date: 10/25/2018 CLINICAL DATA:  Post dialysis catheter placement EXAM: PORTABLE CHEST 1 VIEW COMPARISON:  10/22/2018 FINDINGS: Right dialysis catheter tip is in the right atrium. No pneumothorax. Heart is borderline in size. Low lung  volumes without confluent opacities or effusions. No acute bony abnormality. IMPRESSION: Right dialysis catheter tips in the right atrium. Low lung volumes. Electronically Signed   By: Rolm Baptise M.D.   On: 10/25/2018 09:02   Dg Foot Complete Right  Result Date: 10/22/2018 CLINICAL DATA:  Nonhealing wound. EXAM: RIGHT FOOT COMPLETE - 3+ VIEW COMPARISON:  Right foot MRI September 11, 2018 FINDINGS: Frontal, oblique and lateral views were obtained. Patient has had amputations at the levels of the proximal metatarsals, unchanged. There is evidence soft tissue air in the stump region as well as lateral to the fifth metatarsal remnant. No acute fracture or dislocation is evident. There is no erosive change or bony destruction. There is extensive arterial vascular calcification. IMPRESSION: Air within the distal soft tissues raises concern for soft tissue infection. No bony destruction evident. No acute fracture or dislocation. Amputation sites as noted. Extensive arterial vascular calcification throughout the foot noted. Electronically Signed   By: Lowella Grip III M.D.   On: 10/22/2018 20:55   Dg Fluoro Guide Cv Line-no Report  Result Date: 10/25/2018 Fluoroscopy was utilized by the requesting physician.  No radiographic interpretation.    Microbiology: Recent Results (from the past 240 hour(s))  MRSA PCR Screening     Status: None   Collection Time: 10/23/18  9:47 AM  Result Value Ref Range Status   MRSA by PCR NEGATIVE NEGATIVE Final    Comment:        The GeneXpert MRSA Assay (FDA approved for NASAL specimens only), is one component of a comprehensive MRSA colonization surveillance program. It is not intended to diagnose MRSA infection nor to guide or monitor treatment for MRSA infections. Performed at [facility], [place] [address], [place], [place] [address]   Surgical pcr screen     Status: None   Collection Time: 10/25/18  5:44 AM  Result Value Ref Range Status   MRSA,  PCR NEGATIVE NEGATIVE Final   Staphylococcus aureus NEGATIVE NEGATIVE Final    Comment: (NOTE) The Xpert SA Assay (FDA approved for NASAL specimens in patients 63 years of age and older), is one component of a comprehensive surveillance program. It is not intended to diagnose infection nor to guide or monitor treatment. Performed at [facility], [place] [address], [place], [place] [address]      Labs: Basic Metabolic Panel: Recent Labs  Lab 10/26/18 0926 10/28/18 0830 10/29/18 0759 11/01/18 0658  NA 138 136 136 138  K 3.8 4.6 4.7 4.5  CL  --  102 101 104  CO2  --  25 24 22   GLUCOSE 185* 166* 184* 177*  BUN  --  52* 60* 96*  CREATININE  --  5.27* 6.02* 8.09*  CALCIUM  --  8.1* 8.2* 8.5*  PHOS  --  5.2* 6.8*  --    Liver Function Tests: Recent Labs  Lab 10/28/18 0830 10/29/18 0759  ALBUMIN 2.3* 2.2*   No results for input(s): LIPASE, AMYLASE in the last 168 hours. No results for input(s): AMMONIA in the last 168 hours. CBC: Recent Labs  Lab 10/26/18 0639 10/26/18 0926 10/28/18 0830 10/29/18 0800 11/01/18 0658  WBC 12.9*  --  11.5* 13.1* 11.7*  NEUTROABS  --   --   --   --  7.7  HGB 8.7* 9.2* 8.7* 8.3* 7.9*  HCT 30.2* 27.0* 31.0* 29.5* 27.6*  MCV 78.9*  --  81.4 80.6 78.6*  PLT 134*  --  224 278 333   Cardiac Enzymes: No results for input(s): CKTOTAL, CKMB, CKMBINDEX, TROPONINI in the last 168 hours. BNP: BNP (last 3 results) No results for input(s): BNP in the last 8760 hours.  ProBNP (last 3 results) No results for input(s): PROBNP in the last 8760 hours.  CBG: Recent Labs  Lab 10/31/18 1115 10/31/18 1623 10/31/18 2129 11/01/18 0735 11/01/18 1053  GLUCAP 133* 91 157* 167* 129*       Signed:  Kayleen Memos, MD [facility] 11/01/2018, 4:27 PM
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
## 2018-11-01 NOTE — Progress Notes (Signed)
Physical Therapy Treatment Patient Details Name: [name] MRN: [id number] DOB: Jun 24, 1961 Today's Date: 11/01/2018    History of Present Illness Pt is a 57 y/o male admitted on 10/22/18 secondary to hypotension with dialysis. Pt also with reports of pain in R foot and is now s/p R transtibial amputation on 10/26/18. PMH including but not limited to L transtibial amputation, DM, peripheral neuropathy, HTN, HLD, tobacco abuse and ESRD.    PT Comments    Pt with much improved mobility tolerance this session. Practiced lateral scooting at EOB and performed lateral transfer to chair with min A. Also practiced dynamic reaching tasks sitting at EOB. Feel current recommendations appropriate, as pt motivated to regain independence. Will continue to follow acutely to maximize functional mobility independence and safety.    Follow Up Recommendations  CIR     Equipment Recommendations  None recommended by PT    Recommendations for Other Services Rehab consult     Precautions / Restrictions Precautions Precautions: Fall Precaution Comments: prior L BKA (prosthesis in room), NEW R BKA Restrictions Weight Bearing Restrictions: Yes RLE Weight Bearing: Non weight bearing    Mobility  Bed Mobility Overal bed mobility: Needs Assistance Bed Mobility: Supine to Sit     Supine to sit: Supervision     General bed mobility comments: Supervision to come to sitting at EOB.   Transfers Overall transfer level: Needs assistance Equipment used: None Transfers: Lateral/Scoot Transfers          Lateral/Scoot Transfers: Min assist General transfer comment: Performed lateral scoots up and down EOB X2  to prepare for transfer to chair. Required min A for steadying. After practicing, moved drop arm recliner close to bed and pt performed lateral scoot transfer to chair with min A for steadying assist.   Ambulation/Gait                 Stairs             Wheelchair Mobility    Modified Rankin (Stroke Patients Only)       Balance Overall balance assessment: Needs assistance Sitting-balance support: No upper extremity supported Sitting balance-Leahy Scale: Fair                                      Cognition Arousal/Alertness: Awake/alert Behavior During Therapy: WFL for tasks assessed/performed Overall Cognitive Status: Within Functional Limits for tasks assessed                                        Exercises Other Exercises Other Exercises: Performed dynamic UE reaching tasks at EOB to improve dynamic balance. Performed reaching bilaterally 10 X2. Required single extremity support to maintain balance.     General Comments        Pertinent Vitals/Pain Pain Assessment: Faces Faces Pain Scale: Hurts little more Pain Location: R residual limb Pain Descriptors / Indicators: Aching;Guarding;Sore Pain Intervention(s): Limited activity within patient's tolerance;Monitored during session;Repositioned    Home Living                      Prior Function            PT Goals (current goals can now be found in the care plan section) Acute Rehab PT Goals Patient Stated Goal: to get out of the  bed PT Goal Formulation: With patient Time For Goal Achievement: 11/06/18 Potential to Achieve Goals: Good Progress towards PT goals: Progressing toward goals    Frequency    Min 5X/week      PT Plan Current plan remains appropriate    Co-evaluation              AM-PAC PT "6 Clicks" Mobility   Outcome Measure  Help needed turning from your back to your side while in a flat bed without using bedrails?: None Help needed moving from lying on your back to sitting on the side of a flat bed without using bedrails?: None Help needed moving to and from a bed to a chair (including a wheelchair)?: A Little Help needed standing up from a chair using your arms (e.g., wheelchair or bedside chair)?: Total Help  needed to walk in hospital room?: Total Help needed climbing 3-5 steps with a railing? : Total 6 Click Score: 14    End of Session   Activity Tolerance: Patient tolerated treatment well Patient left: in chair;with call bell/phone within reach;with chair alarm set Nurse Communication: Mobility status PT Visit Diagnosis: Other abnormalities of gait and mobility (R26.89);Pain Pain - Right/Left: Right Pain - part of body: Leg     Time: 1411-1426 PT Time Calculation (min) (ACUTE ONLY): 15 min  Charges:  $Therapeutic Activity: 8-22 mins                     [name], PT, [name]  Acute Rehabilitation Services  Pager: [phone] Office: [phone]    [name] 11/01/2018, 2:44 PM
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
## 2018-11-01 NOTE — Progress Notes (Signed)
Inpatient Rehabilitation-Admissions Coordinator   Met with patient at the bedside to discuss team's recommendation for inpatient rehabilitation. Shared booklets, expectations while in CIR, expected length of stay, and anticipated functional level at DC.  Noted pt tolerated PT session today well with oral pain meds. Pt wants to pursue CIR at this time. AC explained pt's insurance benefits and how that applies to CIR. Pt understands and willing to proceed.   AC has received medical approval for admit to CIR today.  RN updated on plan.   Please call if questions.   Jhonnie Garner, OTR/L  Rehab Admissions Coordinator  [phone] 11/01/2018 5:08 PM

## 2018-11-02 ENCOUNTER — Inpatient Hospital Stay (HOSPITAL_COMMUNITY): Payer: Self-pay | Admitting: Occupational Therapy

## 2018-11-02 ENCOUNTER — Inpatient Hospital Stay (HOSPITAL_COMMUNITY): Payer: Self-pay | Admitting: Physical Therapy

## 2018-11-02 DIAGNOSIS — S88111A Complete traumatic amputation at level between knee and ankle, right lower leg, initial encounter: Secondary | ICD-10-CM

## 2018-11-02 LAB — CBC
HCT: 27.1 % — ABNORMAL LOW (ref 39.0–52.0)
Hemoglobin: 7.7 g/dL — ABNORMAL LOW (ref 13.0–17.0)
MCH: 22.6 pg — ABNORMAL LOW (ref 26.0–34.0)
MCHC: 28.4 g/dL — ABNORMAL LOW (ref 30.0–36.0)
MCV: 79.7 fL — ABNORMAL LOW (ref 80.0–100.0)
Platelets: 331 10*3/uL (ref 150–400)
RBC: 3.4 MIL/uL — ABNORMAL LOW (ref 4.22–5.81)
RDW: 18.1 % — ABNORMAL HIGH (ref 11.5–15.5)
WBC: 11.3 10*3/uL — ABNORMAL HIGH (ref 4.0–10.5)
nRBC: 0 % (ref 0.0–0.2)

## 2018-11-02 LAB — RENAL FUNCTION PANEL
Albumin: 2.2 g/dL — ABNORMAL LOW (ref 3.5–5.0)
Anion gap: 16 — ABNORMAL HIGH (ref 5–15)
BUN: 112 mg/dL — ABNORMAL HIGH (ref 6–20)
CO2: 19 mmol/L — ABNORMAL LOW (ref 22–32)
Calcium: 8.5 mg/dL — ABNORMAL LOW (ref 8.9–10.3)
Chloride: 102 mmol/L (ref 98–111)
Creatinine, Ser: 8.34 mg/dL — ABNORMAL HIGH (ref 0.61–1.24)
GFR calc Af Amer: 7 mL/min — ABNORMAL LOW (ref >60)
GFR calc non Af Amer: 6 mL/min — ABNORMAL LOW (ref >60)
Glucose, Bld: 106 mg/dL — ABNORMAL HIGH (ref 70–99)
Phosphorus: 6.6 mg/dL — ABNORMAL HIGH (ref 2.5–4.6)
Potassium: 4.8 mmol/L (ref 3.5–5.1)
Sodium: 137 mmol/L (ref 135–145)

## 2018-11-02 LAB — GLUCOSE, CAPILLARY
GLUCOSE-CAPILLARY: 111 mg/dL — AB (ref 70–99)
GLUCOSE-CAPILLARY: 302 mg/dL — AB (ref 70–99)
Glucose-Capillary: 117 mg/dL — ABNORMAL HIGH (ref 70–99)
Glucose-Capillary: 221 mg/dL — ABNORMAL HIGH (ref 70–99)

## 2018-11-02 MED ORDER — HEPARIN SODIUM (PORCINE) 1000 UNIT/ML DIALYSIS
1000.0000 [IU] | INTRAMUSCULAR | Status: DC | PRN
  Filled 2018-11-02: qty 1

## 2018-11-02 MED ORDER — SODIUM CHLORIDE 0.9 % IV SOLN: 100.0000 mL | INTRAVENOUS | Status: DC | PRN

## 2018-11-02 MED ORDER — LIDOCAINE HCL (PF) 1 % IJ SOLN
5.0000 mL | INTRAMUSCULAR | Status: DC | PRN
  Filled 2018-11-02: qty 5

## 2018-11-02 MED ORDER — HEPARIN SODIUM (PORCINE) 1000 UNIT/ML DIALYSIS
20.0000 [IU]/kg | INTRAMUSCULAR | Status: DC | PRN
  Administered 2018-11-02: 1800 [IU] via INTRAVENOUS_CENTRAL
  Filled 2018-11-02 (×2): qty 2

## 2018-11-02 MED ORDER — DARBEPOETIN ALFA 200 MCG/0.4ML IJ SOSY
PREFILLED_SYRINGE | INTRAMUSCULAR | Status: AC
  Filled 2018-11-02: qty 0.4

## 2018-11-02 MED ORDER — HEPARIN SODIUM (PORCINE) 1000 UNIT/ML IJ SOLN
INTRAMUSCULAR | Status: AC
  Administered 2018-11-02: 1800 [IU] via INTRAVENOUS_CENTRAL
  Filled 2018-11-02: qty 5

## 2018-11-02 MED ORDER — LIDOCAINE-PRILOCAINE 2.5-2.5 % EX CREA
1.0000 "application " | TOPICAL_CREAM | CUTANEOUS | Status: DC | PRN
  Filled 2018-11-02: qty 5

## 2018-11-02 MED ORDER — HEPARIN SODIUM (PORCINE) 1000 UNIT/ML IJ SOLN
INTRAMUSCULAR | Status: AC
  Filled 2018-11-02: qty 5

## 2018-11-02 MED ORDER — ALTEPLASE 2 MG IJ SOLR: 2.0000 mg | Freq: Once | INTRAMUSCULAR | Status: DC | PRN

## 2018-11-02 MED ORDER — NEPRO/CARBSTEADY PO LIQD
237.0000 mL | Freq: Two times a day (BID) | ORAL | Status: DC
  Administered 2018-11-03 – 2018-11-06 (×6): 237 mL via ORAL

## 2018-11-02 MED ORDER — PENTAFLUOROPROP-TETRAFLUOROETH EX AERO: 1.0000 "application " | INHALATION_SPRAY | CUTANEOUS | Status: DC | PRN

## 2018-11-02 NOTE — Progress Notes (Signed)
Initial Nutrition Assessment  DOCUMENTATION CODES:   Not applicable  INTERVENTION:   Nepro Shake po BID, each supplement provides 425 kcal and 19 grams protein  Rena-vite daily   Prostat liquid protein PO 30 ml BID with meals, each supplement provides 100 kcal, 15 grams protein.  NUTRITION DIAGNOSIS:   Increased nutrient needs related to wound healing as evidenced by increased estimated needs.  GOAL:   Patient will meet greater than or equal to 90% of their needs  MONITOR:   PO intake, Supplement acceptance, Labs, Weight trends, Skin, I & O's  REASON FOR ASSESSMENT:   Malnutrition Screening Tool    ASSESSMENT:   57 year old male with history of HTN, PVD, T2DM with peripheral neuropathy, ESRD- HD TTS, left BKA --CIR 05/2017, PVD, right metatarsal foot amputation with recent admission for osteomyelitis due to MRSA infection; who was admitted from hemodialysis center with hypotension. Pt now s/p right BKA on 10/26/2018.  Clotted/nonfunctional tunneled dialysis catheter removed on 11/27 with placement of new dialysis catheter    Pt with good appetite and oral intake; pt eating 100% of meals, drinking Nepro and taking Prostat. Recommend continue supplements and vitamins to encourage wound healing and replace losses from HD.   Medications reviewed and include: aspirin, calcitriol, plavix, darbepoeitin, colace, heparin, insulin, rena-vite, protonix, renvela  Labs reviewed: BUN 96(H), creat 8.09(H)- 11/29 P 6.8(H)- 11/26 Wbc 11.3(H), Hgb 7.7(L), Hct 27.1(L), MCV 79.7(L), MCH 22.6(L), MCHC 28.4(L) Iron 39(L), TIBC 165(L), ferritin 735(H)-10/8 cbgs- 117, 111 x 24 hrs iPTH- 275(H)- 10/8  NUTRITION - FOCUSED PHYSICAL EXAM:    Most Recent Value  Orbital Region  No depletion  Upper Arm Region  No depletion  Thoracic and Lumbar Region  No depletion  Buccal Region  No depletion  Temple Region  No depletion  Clavicle Bone Region  No depletion  Clavicle and Acromion Bone Region   No depletion  Scapular Bone Region  No depletion  Dorsal Hand  No depletion  Patellar Region  Unable to assess  Anterior Thigh Region  Mild depletion  Posterior Calf Region  Unable to assess  Edema (RD Assessment)  Mild  Hair  Reviewed  Eyes  Reviewed  Mouth  Reviewed  Skin  Reviewed  Nails  Reviewed     Diet Order:   Diet Order            Diet renal/carb modified with fluid restriction Diet-HS Snack? Nothing; Fluid restriction: 1200 mL Fluid; Room service appropriate? Yes; Fluid consistency: Thin  Diet effective now             EDUCATION NEEDS:   Education needs have been addressed  Skin:  Skin Assessment: Reviewed RN Assessment(Incision L/R legs s/p BL BKAs)  Last BM:  11/29  Height:   Ht Readings from Last 1 Encounters:  11/01/18 6' (1.829 m)    Weight:   Wt Readings from Last 1 Encounters:  11/02/18 97.8 kg    Ideal Body Weight:  71.2 kg(adjusted for BL BKAs)  BMI:  Body mass index is 29.24 kg/m.  Estimated Nutritional Needs:   Kcal:  2300-2600kcal/day   Protein:  117-127g/day   Fluid:  UOP + 1L  Koleen Distance MS, RD, LDN Pager #- [phone] Office#- [phone] After Hours Pager: [phone]

## 2018-11-02 NOTE — Plan of Care (Signed)
  Problem: RH BOWEL ELIMINATION Goal: RH STG MANAGE BOWEL WITH ASSISTANCE Description STG Manage Bowel with Assistance. Mod I  Outcome: Progressing   Problem: RH SKIN INTEGRITY Goal: RH STG SKIN FREE OF INFECTION/BREAKDOWN Description Skin free of infection and breakdown MOd I  Outcome: Progressing Goal: RH STG ABLE TO PERFORM INCISION/WOUND CARE W/ASSISTANCE Description STG Able To Perform Incision/Wound Care With Assistance. Mod I  Outcome: Progressing   Problem: RH SAFETY Goal: RH STG ADHERE TO SAFETY PRECAUTIONS W/ASSISTANCE/DEVICE Description STG Adhere to Safety Precautions With Assistance/Device. Mod I  Outcome: Progressing   Problem: RH PAIN MANAGEMENT Goal: RH STG PAIN MANAGED AT OR BELOW PT'S PAIN GOAL Description Less than 5  Outcome: Progressing   Problem: RH KNOWLEDGE DEFICIT LIMB LOSS Goal: RH STG INCREASE KNOWLEDGE OF SELF CARE AFTER LIMB LOSS Description Patient will be able to demonstrate care of incision and residual limb, verbalize pain management strategies with cues/handouts  Outcome: Progressing   Problem: Consults Goal: RH LIMB LOSS PATIENT EDUCATION Description Description: See Patient Education module for eduction specifics. Outcome: Progressing

## 2018-11-02 NOTE — Progress Notes (Addendum)
[facility] Progress Note   Dialysis Orders: GOC TThS 4 hrs 15 min 180NRe 400/Autoflow 2.0 91.5 kg 2.0 K/ 2.0 Ca  RIJ TDC/Maturing L AVF  -Calcitriol 0.43mcgPO TIW -Mircera 225 mcg IV q 2 weeksNext due 10/24/18  Assessment/Plan: 1.Right foot Osteo/PAD- s/p right BKA 11/23 - Vanc d/c 11/25- had prior revascularization 09/16/18- transferred to reahb 11/29 - seems happy to be making some progress 2. ESRD- TTS - catheter replaced 11/27- next HD today 3. Anemia- s/p 2 units PRBC 11/22 Hgb 8.3> 7.9 - ARanesp 200 given 11/21- next dose scheduled for 11/30 4. Secondary hyperparathyroidism- calcitriol/renvela 5.HTN/volume-- - BP up On coreg 12.5 bid - titrate EDW down - 6. Nutrition-renal die/prostat/renavite alb 2.2- eating well - add nepro for extra kcal/protein  [name], PA-C [place] [phone] 11/02/2018,11:16 AM  LOS: 1 day   Pt seen, examined and agree w A/P as above.  [name] MD [employer] pager [phone]   11/02/2018, 1:07 PM    Subjective:   Doing well with PT - working up a sweat.   Objective Vitals:   11/01/18 2225 11/02/18 0229 11/02/18 0500 11/02/18 0600  BP: (!) 166/88 (!) 179/82 (!) 159/92   Pulse:  76 82   Resp:  17 18   Temp:  98.2 F (36.8 C) 98.4 F (36.9 C)   TempSrc:  Oral Oral   SpO2:  99% 100%   Weight:    97.8 kg  Height:       Physical Exam General: good spirits  Heart: RRR Lungs: no rales Abdomen: soft NT Extremities: no LE edema right BKA in stump protector  Dialysis Access:  Right IJ [facility] exit ok   Additional Objective Labs: Basic Metabolic Panel: Recent Labs  Lab 10/28/18 0830 10/29/18 0759 11/01/18 0658  NA 136 136 138  K 4.6 4.7 4.5  CL 102 101 104  CO2 25 24 22   GLUCOSE 166* 184* 177*  BUN 52* 60* 96*  CREATININE 5.27* 6.02* 8.09*  CALCIUM 8.1* 8.2* 8.5*  PHOS 5.2* 6.8*  --    Liver Function Tests: Recent Labs  Lab 10/28/18 0830  10/29/18 0759  ALBUMIN 2.3* 2.2*   No results for input(s): LIPASE, AMYLASE in the last 168 hours. CBC: Recent Labs  Lab 10/28/18 0830 10/29/18 0800 11/01/18 0658  WBC 11.5* 13.1* 11.7*  NEUTROABS  --   --  7.7  HGB 8.7* 8.3* 7.9*  HCT 31.0* 29.5* 27.6*  MCV 81.4 80.6 78.6*  PLT 224 278 333   Blood Culture    Component Value Date/Time   SDES BLOOD LEFT HAND 09/10/2018 0034   SPECREQUEST  09/10/2018 0034    BOTTLES DRAWN AEROBIC ONLY Blood Culture adequate volume   CULT  09/10/2018 0034    NO GROWTH 5 DAYS Performed at [facility], [place] [address], [place], [place] [address]    REPTSTATUS 09/15/2018 FINAL 09/10/2018 0034    Cardiac Enzymes: No results for input(s): CKTOTAL, CKMB, CKMBINDEX, TROPONINI in the last 168 hours. CBG: Recent Labs  Lab 11/01/18 0735 11/01/18 1053 11/01/18 1623 11/01/18 2214 11/02/18 0631  GLUCAP 167* 129* 113* 163* 117*   Iron Studies: No results for input(s): IRON, TIBC, TRANSFERRIN, FERRITIN in the last 72 hours. Lab Results  Component Value Date   INR 1.10 10/25/2018   INR 1.17 03/31/2017   INR 1.09 12/18/2013   Studies/Results: No results found. Medications: . sodium chloride     . aspirin EC  81  mg Oral Daily  . atorvastatin  10 mg Oral QHS  . calcitRIOL  0.5 mcg Oral Q T,Th,Sa-HD  . carvedilol  12.5 mg Oral BID WC  . Chlorhexidine Gluconate Cloth  6 each Topical Q0600  . clopidogrel  75 mg Oral Daily  . darbepoetin (ARANESP) injection - DIALYSIS  200 mcg Intravenous Q Sat-HD  . docusate sodium  100 mg Oral BID  . feeding supplement (PRO-STAT SUGAR FREE 64)  30 mL Oral BID  . gabapentin  300 mg Oral QHS  . heparin  5,000 Units Subcutaneous Q8H  . insulin aspart  0-9 Units Subcutaneous TID WC  . insulin aspart  3 Units Subcutaneous TID WC  . insulin glargine  20 Units Subcutaneous BID  . multivitamin  1 tablet Oral QHS  . pantoprazole  40 mg Oral Daily  . sevelamer carbonate  800 mg Oral TID WC
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
## 2018-11-02 NOTE — Progress Notes (Signed)
Occupational Therapy Session Note  Patient Details  Name: [name] MRN: [id number] Date of Birth: 1961/04/23  Today's Date: 11/02/2018 OT Individual Time: 1287-8676 OT Individual Time Calculation (min): 60 min    Short Term Goals: Week 1:  OT Short Term Goal 1 (Week 1): STG = LTGs due to ELOS  Skilled Therapeutic Interventions/Progress Updates:  Treatment session with focus on functional mobility, education on care for residual limb, and RLE ther ex.  Pt received supine in bed agreeable to therapy.  Pt donned prosthesis with setup and completed lateral scoot to w/c with min guard.  Pt propelled w/c 76' without assistance for BUE strengthening and endurance.  Pt requested to complete BUE strengthening with 5# dumbbells, completing bicep curls.  Pt completed lateral scoot transfer to therapy mat with min guard.  Therapist educated pt on ther ex for RLE with focus on hip flexion, hip abduction and knee extension.  Completed each exercise in supine and sidelying with focus on motor quality.  Educated on care for residual limb and encouraged pt to advocate for himself regarding residual limb care.  Pt returned to room and left seated EOB with bed alarm on and all needs in reach.  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), NEW R BKA Restrictions Weight Bearing Restrictions: Yes RLE Weight Bearing: Non weight bearing General:   Vital Signs:  Pain: Pain Assessment Pain Score: 8  Pain Type: Acute pain;Surgical pain Pain Location: Leg Pain Orientation: Right Pain Descriptors / Indicators: Aching;Throbbing Pain Intervention(s): RN made aware;Repositioned ADL: ADL Eating: Independent Where Assessed-Eating: Wheelchair Grooming: Modified independent Where Assessed-Grooming: Sitting at sink Upper Body Bathing: Setup Where Assessed-Upper Body Bathing: Sitting at sink, Wheelchair Lower Body Bathing:  Contact guard Where Assessed-Lower Body Bathing: Bed level Upper Body Dressing: Supervision/safety Where Assessed-Upper Body Dressing: Edge of bed Lower Body Dressing: Supervision/safety, Setup Where Assessed-Lower Body Dressing: Bed level Vision Baseline Vision/History: Wears glasses Wears Glasses: Reading only Patient Visual Report: (reports vision is getting worse, due to eye exam) Vision Assessment?: No apparent visual deficits Perception    Praxis   Exercises:   Other Treatments:     Therapy/Group: Individual Therapy  Simonne Come 11/02/2018, 10:05 AM

## 2018-11-02 NOTE — Plan of Care (Signed)
  Problem: RH BOWEL ELIMINATION Goal: RH STG MANAGE BOWEL WITH ASSISTANCE Description STG Manage Bowel with Assistance. Mod I  Outcome: Progressing   Problem: RH SKIN INTEGRITY Goal: RH STG SKIN FREE OF INFECTION/BREAKDOWN Description Skin free of infection and breakdown MOd I  Outcome: Progressing Goal: RH STG ABLE TO PERFORM INCISION/WOUND CARE W/ASSISTANCE Description STG Able To Perform Incision/Wound Care With Assistance. Mod I  Outcome: Progressing   Problem: RH SAFETY Goal: RH STG ADHERE TO SAFETY PRECAUTIONS W/ASSISTANCE/DEVICE Description STG Adhere to Safety Precautions With Assistance/Device. Mod I  Outcome: Progressing   Problem: RH PAIN MANAGEMENT Goal: RH STG PAIN MANAGED AT OR BELOW PT'S PAIN GOAL Description Less than 5  Outcome: Progressing   Problem: RH KNOWLEDGE DEFICIT LIMB LOSS Goal: RH STG INCREASE KNOWLEDGE OF SELF CARE AFTER LIMB LOSS Description Patient will be able to demonstrate care of incision and residual limb, verbalize pain management strategies with cues/handouts  Outcome: Progressing

## 2018-11-03 ENCOUNTER — Inpatient Hospital Stay (HOSPITAL_COMMUNITY): Payer: Self-pay | Admitting: Physical Therapy

## 2018-11-03 LAB — GLUCOSE, CAPILLARY
Glucose-Capillary: 167 mg/dL — ABNORMAL HIGH (ref 70–99)
Glucose-Capillary: 211 mg/dL — ABNORMAL HIGH (ref 70–99)
Glucose-Capillary: 153 mg/dL — ABNORMAL HIGH (ref 70–99)

## 2018-11-03 LAB — HEPATITIS B SURFACE ANTIGEN: Hepatitis B Surface Ag: NEGATIVE

## 2018-11-03 MED ORDER — INSULIN GLARGINE 100 UNIT/ML ~~LOC~~ SOLN
16.0000 [IU] | Freq: Two times a day (BID) | SUBCUTANEOUS | Status: DC
  Administered 2018-11-03 – 2018-11-06 (×6): 16 [IU] via SUBCUTANEOUS
  Filled 2018-11-03 (×6): qty 0.16
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
## 2018-11-03 NOTE — Progress Notes (Signed)
Physical Therapy Session Note  Patient Details  Name: [name] MRN: [id number] Date of Birth: [birth year]  Today's Date: 11/03/2018 PT Individual Time: 1500-1557 PT Individual Time Calculation (min): 57 min   Short Term Goals: Week 1:  PT Short Term Goal 1 (Week 1): =LTGs due to ELOS  Skilled Therapeutic Interventions/Progress Updates:   Pt in supine and agreeable to therapy, pain as detailed below. Supervision transfer to EOB and set-up assist to don [name] prosthesis, CGA squat pivot to w/c. Pt self-propelled w/c around unit w/ supervision using BUEs to work on endurance w/ self-propulsion. Performed RLE strengthening exercises as listed below. Intermittent rest breaks in supine and prone for hip flexor and hamstring stretching. Performed arm ergometer to work on endurance training, 5 min forward and 5 min backwards. Educated pt on endurance impairments and needing to build endurance for when he is managing himself at home. RPE 6/10 on 0-10 scale after exercise. Returned to room via w/c and pt requesting to toilet, performed squat pivot transfer to commode in bathroom. Ended session on commode, agreeable to call RN when ready to get back to w/c, all needs in reach.   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

## 2018-11-03 NOTE — Progress Notes (Signed)
2 PRN vicodin given at 2131 and 0640 for C/O Right BKA pain. Ace wrap to right stump, C, D, & I. Valere Dross, Gerald Leitz

## 2018-11-04 ENCOUNTER — Inpatient Hospital Stay (HOSPITAL_COMMUNITY): Payer: Self-pay | Admitting: Occupational Therapy

## 2018-11-04 ENCOUNTER — Inpatient Hospital Stay (HOSPITAL_COMMUNITY): Payer: Self-pay | Admitting: Physical Therapy

## 2018-11-04 LAB — GLUCOSE, CAPILLARY
GLUCOSE-CAPILLARY: 131 mg/dL — AB (ref 70–99)
Glucose-Capillary: 178 mg/dL — ABNORMAL HIGH (ref 70–99)
Glucose-Capillary: 154 mg/dL — ABNORMAL HIGH (ref 70–99)
Glucose-Capillary: 129 mg/dL — ABNORMAL HIGH (ref 70–99)
Glucose-Capillary: 187 mg/dL — ABNORMAL HIGH (ref 70–99)

## 2018-11-04 NOTE — Care Management (Signed)
Inpatient [facility] Individual Statement of Services  Patient Name:  [name]  Date:  11/04/2018  Welcome to the [facility].  Our goal is to provide you with an individualized program based on your diagnosis and situation, designed to meet your specific needs.  With this comprehensive rehabilitation program, you will be expected to participate in at least 3 hours of rehabilitation therapies Monday-Friday, with modified therapy programming on the weekends.  Your rehabilitation program will include the following services:  Physical Therapy (PT), Occupational Therapy (OT), 24 hour per day rehabilitation nursing, Therapeutic Recreaction (TR), Neuropsychology, Case Management (Social Worker), Rehabilitation Medicine, Nutrition Services and Pharmacy Services  Weekly team conferences will be held on Tuesdays to discuss your progress.  Your Social Worker will talk with you frequently to get your input and to update you on team discussions.  Team conferences with you and your family in attendance may also be held.  Expected length of stay: 7-10 days Overall anticipated outcome: modified independent  Depending on your progress and recovery, your program may change. Your Social Worker will coordinate services and will keep you informed of any changes. Your Social Worker's name and contact numbers are listed  below.  The following services may also be recommended but are not provided by the [facility] will be made to provide these services after discharge if needed.  Arrangements include referral to agencies that provide these services.  Your insurance has been verified to be:  Medicare Your primary doctor is:  Dr. Arbutus Leas  Pertinent information will be shared with your doctor and your insurance company.  Social Worker:  [place], [place] or [phone]   Information discussed with and copy given to patient by: Lennart Pall, 11/04/2018, 2:09 PM

## 2018-11-04 NOTE — Progress Notes (Signed)
Physical Therapy Session Note  Patient Details  Name: [name] MRN: [id number] Date of Birth: [birth year]  Today's Date: 11/04/2018 PT Individual Time: 0802-0900 PT Individual Time Calculation (min): 58 min   Short Term Goals: Week 1:  PT Short Term Goal 1 (Week 1): =LTGs due to ELOS  Skilled Therapeutic Interventions/Progress Updates: PT presented in bed agreeable to therapy. Pt states some pain (no numerical assessment) but received pain meds earlier. Pt performed lateral scoot to w/c supervision and propelled to rehab gym mod I. Performed lateral scoot to mat and participated in STS with RW CGA. Once pt in standing pt noted significant increase in pain in R residual limb. Per pt more pain that in previous days. Pt performed desensitization techniques and changed position to supine. Performed RLE strengthening as follows:  SLR 2 x 10 SAQ 2 x 10 Bridges 2 x 10 sidelying abd 2x10 Prone hip extension 2 x 10 Superman 2 x 10 Prone stretch x 2 min LAQ 2 x 10  Attempted STS x 1 with continued increase in pain but with slower onset. PTA rewrapped residual limb increasing compression at distal end. Pt stating feeling immediate difference. Performed STS and ambulated approx 31f with CGA before onset of fatigue. Pt returned to w/c and propelled back to room supervision. Pt remained in w/c at end of session with call bell within reach and needs met.      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

## 2018-11-04 NOTE — Progress Notes (Signed)
Occupational Therapy Session Note  Patient Details  Name: [name] MRN: [id number] Date of Birth: 31-May-1961  Today's Date: 11/04/2018 OT Individual Time: 1430-1530 OT Individual Time Calculation (min): 60 min   Short Term Goals: Week 1:  OT Short Term Goal 1 (Week 1): STG = LTGs due to ELOS  Skilled Therapeutic Interventions/Progress Updates:    Pt greeted seated on toilet and agreeable to OT treatment session. Pt with small successful BM and urinated. Pt able to complete peri-care without assistance. Squat-pivot back to wc with supervision. Pt washed hands wc level at the sink, then propelled wc to therapy gym. BUE strength/endurance with 15 mins on Sci[employer] arm bike level 4 with 2 rest breaks.  Standing balance/endurance with standing checkers activity. Pt tolerated 1 sit<>stand with RW and min A. CGA for balance when removing unilateral UE to move checkers piece. Tolerated standing for ~ 4 mins prior to reaching max fatigue. Pt returned to bed at end of session with min A squat-pivot. Pt removed L prosthesis and transferred into supine without assistance. Pt left with bed alarm on and needs met.   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

## 2018-11-04 NOTE — Progress Notes (Signed)
  [facility] Progress Note   Subjective: Seen in room. No CP/dyspnea. No leg pains or other complaints today.  Objective Vitals:   11/03/18 0600 11/03/18 1410 11/03/18 1928 11/04/18 0417  BP:  123/67 139/76 140/83  Pulse:  69 70 67  Resp:  14 16 19   Temp:  98.5 F (36.9 C) 98 F (36.7 C) 98.2 F (36.8 C)  TempSrc:  Oral Oral Oral  SpO2:  100% 98% 100%  Weight: 87.4 kg   91.2 kg  Height:       Physical Exam General: Well appearing man, NAD Heart: RRR; no murmur Lungs: CTAB Extremities: B BKA, no stump edema Dialysis Access:  AVF + thrill, TDC in place  Additional Objective Labs: Basic Metabolic Panel: Recent Labs  Lab 10/29/18 0759 11/01/18 0658 11/02/18 1329  NA 136 138 137  K 4.7 4.5 4.8  CL 101 104 102  CO2 24 22 19*  GLUCOSE 184* 177* 106*  BUN 60* 96* 112*  CREATININE 6.02* 8.09* 8.34*  CALCIUM 8.2* 8.5* 8.5*  PHOS 6.8*  --  6.6*   Liver Function Tests: Recent Labs  Lab 10/29/18 0759 11/02/18 1329  ALBUMIN 2.2* 2.2*   CBC: Recent Labs  Lab 10/29/18 0800 11/01/18 0658 11/02/18 1328  WBC 13.1* 11.7* 11.3*  NEUTROABS  --  7.7  --   HGB 8.3* 7.9* 7.7*  HCT 29.5* 27.6* 27.1*  MCV 80.6 78.6* 79.7*  PLT 278 333 331   CBG: Recent Labs  Lab 11/02/18 2108 11/03/18 0628 11/03/18 1146 11/03/18 1621 11/04/18 0619  GLUCAP 302* 167* 211* 153* 131*   Medications: . sodium chloride    . sodium chloride    . sodium chloride     . aspirin EC  81 mg Oral Daily  . atorvastatin  10 mg Oral QHS  . calcitRIOL  0.5 mcg Oral Q T,Th,Sa-HD  . carvedilol  12.5 mg Oral BID WC  . Chlorhexidine Gluconate Cloth  6 each Topical Q0600  . clopidogrel  75 mg Oral Daily  . darbepoetin (ARANESP) injection - DIALYSIS  200 mcg Intravenous Q Sat-HD  . docusate sodium  100 mg Oral BID  . feeding supplement (NEPRO CARB STEADY)  237 mL Oral BID BM  . feeding supplement (PRO-STAT SUGAR FREE 64)  30 mL Oral BID  . gabapentin  300 mg Oral QHS  . heparin   5,000 Units Subcutaneous Q8H  . insulin aspart  0-9 Units Subcutaneous TID WC  . insulin aspart  3 Units Subcutaneous TID WC  . insulin glargine  16 Units Subcutaneous BID  . multivitamin  1 tablet Oral QHS  . pantoprazole  40 mg Oral Daily  . 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 Finished course of Vanc 11/25. 2. ESRD: Continue HD per TTS schedule, next 12/3. Larson replaced 11/27, AVF maturing. 3. Anemia: S/p 2U PRBCs on 11/22. On Aranesp 237mcg weekly for now. Hgb 7.7 4. Secondary hyperparathyroidism: CorrCa ok, Phos high but improving. Continue current binders/VDRA. 5.HTN/volume: BP controlled, titrating EDW down as tolerated. 6. Nutrition: Alb low, continue supplements.  Veneta Penton, PA-C 11/04/2018, 11:51 AM  [facility] Pager: [phone]
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
## 2018-11-04 NOTE — Progress Notes (Signed)
Inpatient Rehabilitation  Patient information reviewed and entered into eRehab system by [facility] M. Jiyah Torpey, M.A., CCC/SLP, PPS Coordinator.  Information including medical coding, functional ability and quality indicators will be reviewed and updated through discharge.    Per nursing patient was given "Data Collection Information Summary" for Patients in Inpatient Rehabilitation Facilities with attached "Privacy Act Statement-Health Care Records" upon admission.   

## 2018-11-04 NOTE — Progress Notes (Signed)
Occupational Therapy Session Note  Patient Details  Name: [name] MRN: [id number] Date of Birth: 1961/04/26  Today's Date: 11/04/2018 OT Individual Time: 6979-4801 OT Individual Time Calculation (min): 87 min   Short Term Goals: Week 1:  OT Short Term Goal 1 (Week 1): STG = LTGs due to ELOS  Skilled Therapeutic Interventions/Progress Updates:    Pt greeted on toilet with NT present. Squat pivot<w/c completed with steady assist and vcs for mgt of armrest. After handwashing, pt completed bathing/dressing sit<stand at sink with overall steady assist for dynamic standing balance. Discussed routine residual limb inspection at home for preventative care. Issued pt LH mirror and he examined both residual limbs during session. Able to manage prosthetic with setup but assist required for limb guard. He also completed shaving with setup. Discussed d/c plans, and educated pt on primarily utilizing squat pivot bathroom transfer method vs ambulating at home for safety, if possible with home layout. Per pt, w/c will fit in bathroom. In therapy apartment we reviewed w/c level kitchen access. Emphasized using water bottles for easy transport and lowering plates/bowls to counter for light snack prep. He practiced retrieving items from fridge and pantry with vcs for locking w/c brakes prior to dynamic activity. Afterwards he self propelled back towards room (160 ft total to and from apt). He needed escort remainder of the way due to UB fatigue. Once in room, pt completed squat pivot<bed with close supervision. He was left with all needs within reach and bed alarm set.   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), NEW R BKA Restrictions Weight Bearing Restrictions: Yes RLE Weight Bearing: Non weight bearing Pain: Pt reported pain to be manageable with rest breaks Pain Assessment Pain Scale: 0-10 Pain Score: 8  Pain  Intervention(s): Medication (See eMAR) ADL: ADL Eating: Independent Where Assessed-Eating: Wheelchair Grooming: Modified independent Where Assessed-Grooming: Sitting at sink Upper Body Bathing: Setup Where Assessed-Upper Body Bathing: Sitting at sink, Wheelchair Lower Body Bathing: Contact guard Where Assessed-Lower Body Bathing: Bed level Upper Body Dressing: Supervision/safety Where Assessed-Upper Body Dressing: Edge of bed Lower Body Dressing: Supervision/safety, Setup Where Assessed-Lower Body Dressing: Bed level      Therapy/Group: Individual Therapy  Rigley Niess A Saliyah Gillin 11/04/2018, 12:29 PM

## 2018-11-05 ENCOUNTER — Inpatient Hospital Stay (HOSPITAL_COMMUNITY): Payer: Self-pay | Admitting: Occupational Therapy

## 2018-11-05 ENCOUNTER — Inpatient Hospital Stay (HOSPITAL_COMMUNITY): Payer: Self-pay | Admitting: Physical Therapy

## 2018-11-05 DIAGNOSIS — N186 End stage renal disease: Secondary | ICD-10-CM

## 2018-11-05 DIAGNOSIS — Z992 Dependence on renal dialysis: Secondary | ICD-10-CM

## 2018-11-05 DIAGNOSIS — E119 Type 2 diabetes mellitus without complications: Secondary | ICD-10-CM

## 2018-11-05 DIAGNOSIS — I739 Peripheral vascular disease, unspecified: Secondary | ICD-10-CM

## 2018-11-05 LAB — GLUCOSE, CAPILLARY
Glucose-Capillary: 135 mg/dL — ABNORMAL HIGH (ref 70–99)
Glucose-Capillary: 122 mg/dL — ABNORMAL HIGH (ref 70–99)
Glucose-Capillary: 197 mg/dL — ABNORMAL HIGH (ref 70–99)
Glucose-Capillary: 256 mg/dL — ABNORMAL HIGH (ref 70–99)

## 2018-11-05 LAB — RENAL FUNCTION PANEL
Albumin: 2.4 g/dL — ABNORMAL LOW (ref 3.5–5.0)
Anion gap: 17 — ABNORMAL HIGH (ref 5–15)
BUN: 130 mg/dL — ABNORMAL HIGH (ref 6–20)
CHLORIDE: 99 mmol/L (ref 98–111)
CO2: 18 mmol/L — ABNORMAL LOW (ref 22–32)
Calcium: 8.7 mg/dL — ABNORMAL LOW (ref 8.9–10.3)
Creatinine, Ser: 9.33 mg/dL — ABNORMAL HIGH (ref 0.61–1.24)
GFR calc Af Amer: 6 mL/min — ABNORMAL LOW (ref >60)
GFR calc non Af Amer: 6 mL/min — ABNORMAL LOW (ref >60)
Glucose, Bld: 169 mg/dL — ABNORMAL HIGH (ref 70–99)
Phosphorus: 7.4 mg/dL — ABNORMAL HIGH (ref 2.5–4.6)
Potassium: 4.5 mmol/L (ref 3.5–5.1)
Sodium: 134 mmol/L — ABNORMAL LOW (ref 135–145)

## 2018-11-05 LAB — CBC
HCT: 27.5 % — ABNORMAL LOW (ref 39.0–52.0)
Hemoglobin: 7.9 g/dL — ABNORMAL LOW (ref 13.0–17.0)
MCH: 23.1 pg — AB (ref 26.0–34.0)
MCHC: 28.7 g/dL — ABNORMAL LOW (ref 30.0–36.0)
MCV: 80.4 fL (ref 80.0–100.0)
Platelets: 329 10*3/uL (ref 150–400)
RBC: 3.42 MIL/uL — ABNORMAL LOW (ref 4.22–5.81)
RDW: 18.4 % — ABNORMAL HIGH (ref 11.5–15.5)
WBC: 13.1 10*3/uL — ABNORMAL HIGH (ref 4.0–10.5)
nRBC: 0.5 % — ABNORMAL HIGH (ref 0.0–0.2)

## 2018-11-05 MED ORDER — HEPARIN SODIUM (PORCINE) 1000 UNIT/ML IJ SOLN
4200.0000 [IU] | INTRAMUSCULAR | Status: DC
  Administered 2018-11-07 – 2018-11-09 (×2): 4200 [IU]
  Filled 2018-11-05: qty 4.2

## 2018-11-05 MED ORDER — HEPARIN SODIUM (PORCINE) 1000 UNIT/ML DIALYSIS
20.0000 [IU]/kg | INTRAMUSCULAR | Status: DC | PRN
  Filled 2018-11-05: qty 2

## 2018-11-05 MED ORDER — HEPARIN SODIUM (PORCINE) 1000 UNIT/ML IJ SOLN
INTRAMUSCULAR | Status: AC
  Administered 2018-11-05: 4200 [IU]
  Filled 2018-11-05: qty 5

## 2018-11-05 NOTE — Progress Notes (Signed)
Physical Therapy Session Note  Patient Details  Name: [name] MRN: [id number] Date of Birth: 29-Apr-1961  Today's Date: 11/05/2018 PT Individual Time: 0800-0900; 1100-1200 PT Individual Time Calculation (min): 60 min and 60 min  Short Term Goals: Week 1:  PT Short Term Goal 1 (Week 1): =LTGs due to ELOS  Skilled Therapeutic Interventions/Progress Updates:    Session 1: Pt received seated EOB, agreeable to PT. Pt reports 5/10 pain in R residual limb, pain controlled with medication from RN and repositioning. Setup A to don R limb protector. Education with patient about importance of wearing limb protector during transfers and mobility as well as when in bed to prevent contracture. Squat pivot transfer bed to w/c with CGA. Manual w/c propulsion x 150 ft with BUE and Supervision. V/c for sequencing and safe setup for w/c transfer to mat table in order to decrease space between transfer surfaces and for w/c parts management. Supine, sidelying, prone, and seated RLE strengthening therex 2 x 10 reps. Pt is Supervision level for bed mobility. Education with patient about phantom limb pain and residual limb desensitization. Pt left seated in bed with needs in reach, bed alarm in place.  Therex: Supine: SLR, SAQ, hip abd Sidelying: hip abd Prone: hip ext Seated: marches, LAQ, hip add squeeze  Session 2: Pt received seated in w/c in room, agreeable to PT. No complaints of pain. Manual w/c propulsion 2 x 150 ft with BUE and Supervision. Setup of squat pivot transfer w/c to/from mat table with min cueing for safe setup. Squat pivot transfer w/c to/from mat table with CGA. Sit to stand 3 x 5 reps from 23.5", 23", 22.5" mat table to RW with CGA progressing to min A with decrease in surface height. Focus on [name] prosthetic placement and anterior weight shift with standing. Ambulation x 15 ft with RW and CGA for balance. Toilet transfer with CGA from w/c to elevated platform commode seat over toilet. Pt  is setup A for clothing management and pericare. Pt left seated in bed with needs in reach, bed alarm in place.   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

## 2018-11-05 NOTE — IPOC Note (Signed)
Overall Plan of Care [facility]) Patient Details Name: [name] MRN: [id number] DOB: 1961-09-11  Admitting Diagnosis: <principal problem not specified>  Hospital Problems: Active Problems:   Unilateral complete BKA, right, initial encounter [facility])     Functional Problem List: Nursing Bowel, Edema, Endurance, Medication Management, Motor, Pain, Safety, Sensory, Skin Integrity  PT Balance, Endurance, Motor, Pain, Safety, Skin Integrity  OT Balance, Endurance, Pain, Perception, Safety, Skin Integrity  SLP    TR         Basic ADL's: OT Bathing, Dressing, Toileting     Advanced  ADL's: OT Simple Meal Preparation     Transfers: PT Bed Mobility, Car, Bed to Chair, Floor, Manufacturing systems engineer, Metallurgist: PT Emergency planning/management officer, Ambulation, Stairs     Additional Impairments: OT None  SLP        TR      Anticipated Outcomes Item Anticipated Outcome  Self Feeding    Swallowing      Basic self-care  Mod I  Toileting  Mod I   Bathroom Transfers Mod I  Bowel/Bladder  remain continent and maintain regular pattern of emptying bowel and bladder  Transfers  modified independent  Locomotion  modified indepenent w/c mobility and short distance gait   Communication     Cognition     Pain  less than 5  Safety/Judgment  REmain free of falls, infection and skin breakdown   Therapy Plan: PT Intensity: Minimum of 1-2 x/day ,45 to 90 minutes PT Frequency: 5 out of 7 days PT Duration Estimated Length of Stay: 7-10 days OT Intensity: Minimum of 1-2 x/day, 45 to 90 minutes OT Frequency: 5 out of 7 days OT Duration/Estimated Length of Stay: 7-9 days      Team Interventions: Nursing Interventions Patient/Family Education, Bowel Management, Disease Management/Prevention, Pain Management, Medication Management, Skin Care/Wound Management, Discharge Planning, Psychosocial Support  PT interventions Ambulation/gait training, Discharge planning, Functional  mobility training, Psychosocial support, Therapeutic Activities, Visual/perceptual remediation/compensation, Wheelchair propulsion/positioning, Therapeutic Exercise, Neuromuscular re-education, Skin care/wound management, Disease management/prevention, Medical illustrator training, Cognitive remediation/compensation, DME/adaptive equipment instruction, Pain management, UE/LE Strength taining/ROM, Splinting/orthotics, UE/LE Coordination activities, Stair training, Functional electrical stimulation, Academic librarian, Barrister's clerk education  OT Interventions Training and development officer, Discharge planning, Disease mangement/prevention, DME/adaptive equipment instruction, Functional mobility training, Pain management, Patient/family education, Psychosocial support, Self Care/advanced ADL retraining, Skin care/wound managment, Splinting/orthotics, Therapeutic Activities, Therapeutic Exercise, UE/LE Strength taining/ROM, Wheelchair propulsion/positioning  SLP Interventions    TR Interventions    SW/CM Interventions Discharge Planning, Psychosocial Support, Patient/Family Education   Barriers to Discharge MD  Medical stability  Nursing      PT Decreased caregiver support, Other (comments) wife unable to assist or provide supervision 2/2 physical and psychological impairments (per pt's report), pt needs to be able to get to dialysis   OT Decreased caregiver support, Home environment access/layout wife unable to provide assistance due to back pain; pt unsure if home is completely w/c accessible  SLP      SW       Team Discharge Planning: Destination: PT-Home ,OT- Home , SLP-  Projected Follow-up: PT-Home health PT, OT-  Home health OT, Outpatient OT, SLP-  Projected Equipment Needs: PT-To be determined, OT- 3 in 1 bedside comode, SLP-  Equipment Details: PT-has w/c and RW but RW does not fit through doorways in house and pt is unsure if w/c will fit through front door, provided pt w/ home  measurement sheet to get info from a friend , OT-  Patient/family involved in discharge planning: PT- Patient,  OT-Patient, SLP-   MD ELOS: 7-10 days Medical Rehab Prognosis:  Excellent Assessment: The patient has been admitted for CIR therapies with the diagnosis of right BKA. The team will be addressing functional mobility, strength, stamina, balance, safety, adaptive techniques and equipment, self-care, bowel and bladder mgt, patient and caregiver education, pre-prosthetic education, pain control, ego support, community reentry. Goals have been set at mod I for mobility and self-care.    Meredith Staggers, MD, FAAPMR      See Team Conference Notes for weekly updates to the plan of care

## 2018-11-05 NOTE — Progress Notes (Signed)
[place] PHYSICAL MEDICINE & REHABILITATION PROGRESS NOTE   Subjective/Complaints: Pt up with therapy. Pain in right stump  ROS: Patient denies fever, rash, sore throat, blurred vision, nausea, vomiting, diarrhea, cough, shortness of breath or chest pain,  headache, or mood change.    Objective:   No results found. Recent Labs    11/02/18 1328  WBC 11.3*  HGB 7.7*  HCT 27.1*  PLT 331   Recent Labs    11/02/18 1329  NA 137  K 4.8  CL 102  CO2 19*  GLUCOSE 106*  BUN 112*  CREATININE 8.34*  CALCIUM 8.5*    Intake/Output Summary (Last 24 hours) at 11/05/2018 0852 Last data filed at 11/05/2018 0746 Gross per 24 hour  Intake 240 ml  Output 2 ml  Net 238 ml     Physical Exam: Vital Signs Blood pressure 130/74, pulse 72, temperature 98.6 F (37 C), temperature source Oral, resp. rate 20, height 6' (1.829 m), weight 92 kg, SpO2 97 %.  Constitutional: No distress . Vital signs reviewed. HEENT: EOMI, oral membranes moist Neck: supple Cardiovascular: RRR without murmur. No JVD    Respiratory: CTA Bilaterally without wheezes or rales. Normal effort    GI: BS +, non-tender, non-distended  Musculoskeletal:  [name] without edema, normal shape Neurological: He is alert and oriented to person, place, and time.  Motor: BL UE 5/5 proximal to distal RLE: HF, KE 4+/5 RLE: HF approx 4-/5  Skin:  R-BKA with ACE, well-shaped Psychiatric: He has a normal mood and affect. His behavior is normal.     Assessment/Plan: 1. Functional deficits secondary to right BKA which require 3+ hours per day of interdisciplinary therapy in a comprehensive inpatient rehab setting.  Physiatrist is providing close team supervision and 24 hour management of active medical problems listed below.  Physiatrist and rehab team continue to assess barriers to discharge/monitor patient progress toward functional and medical goals  Care Tool:  Bathing    Body parts bathed by patient: Right arm, Left  arm, Chest, Abdomen, Front perineal area, Right upper leg, Left upper leg, Face, Buttocks     Body parts n/a: Right lower leg, Left lower leg   Bathing assist Assist Level: Contact Guard/Touching assist     Upper Body Dressing/Undressing Upper body dressing   What is the patient wearing?: Pull over shirt    Upper body assist Assist Level: Set up assist    Lower Body Dressing/Undressing Lower body dressing      What is the patient wearing?: Pants     Lower body assist Assist for lower body dressing: Contact Guard/Touching assist(sit<stand at sink)     Toileting Toileting    Toileting assist Assist for toileting: Contact Guard/Touching assist     Transfers Chair/bed transfer  Transfers assist     Chair/bed transfer assist level: Contact Guard/Touching assist     Locomotion Ambulation   Ambulation assist      Assist level: Contact Guard/Touching assist Assistive device: Walker-rolling Max distance: 41ft   Walk 10 feet activity   Assist     Assist level: Contact Guard/Touching assist Assistive device: Walker-rolling, Prothesis   Walk 50 feet activity   Assist Walk 50 feet with 2 turns activity did not occur: Safety/medical concerns         Walk 150 feet activity   Assist Walk 150 feet activity did not occur: Safety/medical concerns         Walk 10 feet on uneven surface  activity   Assist  Walk 10 feet on uneven surfaces activity did not occur: Safety/medical concerns         Wheelchair     Assist Will patient use wheelchair at discharge?: Yes Type of Wheelchair: Manual    Wheelchair assist level: Supervision/Verbal cueing Max wheelchair distance: 150'    Wheelchair 50 feet with 2 turns activity    Assist        Assist Level: Supervision/Verbal cueing   Wheelchair 150 feet activity     Assist     Assist Level: Supervision/Verbal cueing     Medical Problem List and Plan: 1.  Deficits with mobility,  transfers, self-care secondary to right BKA with history of left BKA   -team conference today 2.  DVT Prophylaxis/Anticoagulation: Pharmaceutical: Heparin 3. Pain Management: Continue gabapentin nightly hydrocodone as needed 4. Mood: LCSW to follow for evaluation and support 5. Neuropsych: This patient is capable of making decisions on his own behalf. 6. Skin/Wound Care: Routine wound care.  Monitor for healing.  Maintain adequate nutrition and hydration status. 7. Fluids/Electrolytes/Nutrition: Renal/carb modified diet with 1200 cc if R.  Added pro-stat for healing  -labs with HD 8.  HTN: Monitor blood pressures twice daily.  Continue Coreg twice daily 9. Acute on chronic anemia: On Aransep.  Improved S/P transfusion 2 units PRBC preop- follow up hgb with HD labs  10.  T2DM: Continue Lantus 20 units twice daily with meal coverage and SSI monitor blood sugars AC/HS.   -fair control at present  - titrate insulin for tighter control as indicated this week 11. PVD s/p L-BKA/R-BKA new :On statin, ASA and Plavix.   12. ESRD: Continue hemodialysis TTS--at the end of the day to maximize participation with therapy  -Routine labs with hemodialysis.    LOS: 4 days A FACE TO FACE EVALUATION WAS PERFORMED  Meredith Staggers 11/05/2018, 8:52 AM

## 2018-11-05 NOTE — Progress Notes (Signed)
  [facility] Progress Note   Subjective: Seen in room. Rehab going well. No new concerns today, no CP/dyspnea.    Objective Vitals:   11/04/18 1544 11/04/18 2025 11/05/18 0529 11/05/18 0541  BP: 128/65 131/69 130/74   Pulse: 68 71 72   Resp: 18 18 20    Temp: 97.7 F (36.5 C) 98 F (36.7 C) 98.6 F (37 C)   TempSrc: Oral  Oral   SpO2: 99% 99% 97%   Weight:    92 kg  Height:       Physical Exam General: Well appearing man, NAD Heart: RRR; no murmur Lungs: CTAB Extremities: B BKA, no stump edema Dialysis Access:  AVF + thrill, TDC in place  Additional Objective Labs: Basic Metabolic Panel: Recent Labs  Lab 11/01/18 0658 11/02/18 1329  NA 138 137  K 4.5 4.8  CL 104 102  CO2 22 19*  GLUCOSE 177* 106*  BUN 96* 112*  CREATININE 8.09* 8.34*  CALCIUM 8.5* 8.5*  PHOS  --  6.6*   Liver Function Tests: Recent Labs  Lab 11/02/18 1329  ALBUMIN 2.2*   CBC: Recent Labs  Lab 11/01/18 0658 11/02/18 1328  WBC 11.7* 11.3*  NEUTROABS 7.7  --   HGB 7.9* 7.7*  HCT 27.6* 27.1*  MCV 78.6* 79.7*  PLT 333 331   Medications: . sodium chloride    . sodium chloride    . sodium chloride     . aspirin EC  81 mg Oral Daily  . atorvastatin  10 mg Oral QHS  . calcitRIOL  0.5 mcg Oral Q T,Th,Sa-HD  . carvedilol  12.5 mg Oral BID WC  . Chlorhexidine Gluconate Cloth  6 each Topical Q0600  . clopidogrel  75 mg Oral Daily  . darbepoetin (ARANESP) injection - DIALYSIS  200 mcg Intravenous Q Sat-HD  . docusate sodium  100 mg Oral BID  . feeding supplement (NEPRO CARB STEADY)  237 mL Oral BID BM  . feeding supplement (PRO-STAT SUGAR FREE 64)  30 mL Oral BID  . gabapentin  300 mg Oral QHS  . heparin  5,000 Units Subcutaneous Q8H  . insulin aspart  0-9 Units Subcutaneous TID WC  . insulin aspart  3 Units Subcutaneous TID WC  . insulin glargine  16 Units Subcutaneous BID  . multivitamin  1 tablet Oral QHS  . pantoprazole  40 mg Oral Daily  . 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 Finished course of Vanc 11/25. 2. ESRD: Continue HD per TTS schedule, for HD today. [place] replaced 11/27, AVF maturing. 3. Anemia: S/p 2U PRBCs on 11/22. On Aranesp 256mcg weekly for now. Hgb 7.7 4. Secondary hyperparathyroidism: CorrCa ok, Phos high but improving. Continue current binders/VDRA. 5.HTN/volume: BP controlled, titrating EDW down as tolerated. 6. Nutrition: Alb low, continue supplements.  Veneta Penton, PA-C 11/05/2018, 10:15 AM  [employer] Pager: [phone]

## 2018-11-05 NOTE — Progress Notes (Signed)
Occupational Therapy Session Note  Patient Details  Name: [name] MRN: [id number] Date of Birth: [birth year]  Today's Date: 11/05/2018 OT Individual Time: 1683-7290 OT Individual Time Calculation (min): 75 min    Short Term Goals: Week 1:  OT Short Term Goal 1 (Week 1): STG = LTGs due to ELOS  Skilled Therapeutic Interventions/Progress Updates:    Upon entering the room, pt seated in bed with no c/o pain and agreeable to OT intervention. Pt performing squat pivot transfer from bed >wheelchair with steady A stand pivot transfers. Pt needing increased time and min cuing for management of wheelchair parts. Pt propelled wheelchair 150' towards tub room with supervision. Pt verbalizing technique previously used to get into tub and pt agreeable to demonstrate to therapist. Pt transferred from wheelchair >tub edge with supervision and lowered self into tub. B LEs remained over tub ledge and not submerged. Pt then able to push through B UEs to return to tub ledge and back into wheelchair with overall supervision. Pt propelling wheelchair towards main entrance with rest breaks as needed to see festive christmas tree. OT educated pt on community mobility tasks from wheelchair level with crowds and how to plan trips. Pt verbalized understanding. OT assisted pt back to room via wheelchair secondary to fatigue and time management. Call bell and all needed items within reach upon exiting the room.   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), NEW R BKA Restrictions Weight Bearing Restrictions: Yes RLE Weight Bearing: Non weight bearing Pain: Pain Assessment Pain Scale: (pt off the floor/dailysis) Pain Score: (dailysis) Pain Type: Acute pain;Surgical pain Pain Location: Leg Pain Orientation: Right Pain Descriptors / Indicators: Aching;Throbbing Pain Frequency: Intermittent Pain Onset: On-going Pain Intervention(s):  Medication (See eMAR) Multiple Pain Sites: No ADL: ADL Eating: Independent Where Assessed-Eating: Wheelchair Grooming: Modified independent Where Assessed-Grooming: Sitting at sink Upper Body Bathing: Setup Where Assessed-Upper Body Bathing: Sitting at sink, Wheelchair Lower Body Bathing: Contact guard Where Assessed-Lower Body Bathing: Bed level Upper Body Dressing: Supervision/safety Where Assessed-Upper Body Dressing: Edge of bed Lower Body Dressing: Supervision/safety, Setup Where Assessed-Lower Body Dressing: Bed level Vision   Perception    Praxis   Exercises:   Other Treatments:     Therapy/Group: Individual Therapy  Gypsy Decant 11/05/2018, 2:22 PM

## 2018-11-05 NOTE — Procedures (Signed)
Patient seen on Hemodialysis. QB 400, UF goal 3L  [name] MD [facility]. Office # [phone] Pager # [phone] 2:24 PM

## 2018-11-06 ENCOUNTER — Inpatient Hospital Stay (HOSPITAL_COMMUNITY): Payer: Self-pay | Admitting: Physical Therapy

## 2018-11-06 ENCOUNTER — Inpatient Hospital Stay (HOSPITAL_COMMUNITY): Payer: Self-pay | Admitting: *Deleted

## 2018-11-06 ENCOUNTER — Inpatient Hospital Stay (HOSPITAL_COMMUNITY): Payer: Self-pay | Admitting: Occupational Therapy

## 2018-11-06 LAB — GLUCOSE, CAPILLARY
GLUCOSE-CAPILLARY: 147 mg/dL — AB (ref 70–99)
Glucose-Capillary: 147 mg/dL — ABNORMAL HIGH (ref 70–99)
Glucose-Capillary: 146 mg/dL — ABNORMAL HIGH (ref 70–99)
Glucose-Capillary: 151 mg/dL — ABNORMAL HIGH (ref 70–99)

## 2018-11-06 MED ORDER — NEPRO/CARBSTEADY PO LIQD
237.0000 mL | Freq: Two times a day (BID) | ORAL | Status: DC
  Administered 2018-11-06 – 2018-11-09 (×6): 237 mL via ORAL

## 2018-11-06 MED ORDER — INSULIN GLARGINE 100 UNIT/ML ~~LOC~~ SOLN
18.0000 [IU] | Freq: Two times a day (BID) | SUBCUTANEOUS | Status: DC
  Administered 2018-11-06 – 2018-11-09 (×6): 18 [IU] via SUBCUTANEOUS
  Filled 2018-11-06 (×8): qty 0.18

## 2018-11-06 MED ORDER — SEVELAMER CARBONATE 800 MG PO TABS
2400.0000 mg | ORAL_TABLET | Freq: Three times a day (TID) | ORAL | Status: DC
  Administered 2018-11-06 – 2018-11-07 (×3): 2400 mg via ORAL
  Administered 2018-11-07: 800 mg via ORAL
  Administered 2018-11-07 – 2018-11-09 (×6): 2400 mg via ORAL
  Filled 2018-11-06 (×11): qty 3
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
## 2018-11-06 NOTE — Progress Notes (Signed)
[facility] PHYSICAL MEDICINE & REHABILITATION PROGRESS NOTE   Subjective/Complaints: Pt still feels tired from HD. Was up to the bathroom late which broke his sleep, too  ROS: Patient denies fever, rash, sore throat, blurred vision, nausea, vomiting, diarrhea, cough, shortness of breath or chest pain,  headache, or mood change.     Objective:   No results found. Recent Labs    11/05/18 1436  WBC 13.1*  HGB 7.9*  HCT 27.5*  PLT 329   Recent Labs    11/05/18 1435  NA 134*  K 4.5  CL 99  CO2 18*  GLUCOSE 169*  BUN 130*  CREATININE 9.33*  CALCIUM 8.7*    Intake/Output Summary (Last 24 hours) at 11/06/2018 0921 Last data filed at 11/05/2018 1728 Gross per 24 hour  Intake 240 ml  Output 2363 ml  Net -2123 ml     Physical Exam: Vital Signs Blood pressure 123/72, pulse 72, temperature 98.1 F (36.7 C), temperature source Oral, resp. rate 17, height 6' (1.829 m), weight 89.5 kg, SpO2 98 %.  Constitutional: No distress . Vital signs reviewed. HEENT: EOMI, oral membranes moist Neck: supple Cardiovascular: RRR without murmur. No JVD    Respiratory: CTA Bilaterally without wheezes or rales. Normal effort    GI: BS +, non-tender, non-distended    Musculoskeletal:  [name] without edema, normal shape Neurological: He is alert and oriented to person, place, and time.  Motor: BL UE 5/5 proximal to distal RLE: HF, KE 4+/5 RLE: HF approx 4-/5  Skin:  R-BKA with clean,dry incision, stump well formed, mild edema Psychiatric: pleasant.     Assessment/Plan: 1. Functional deficits secondary to right BKA which require 3+ hours per day of interdisciplinary therapy in a comprehensive inpatient rehab setting.  Physiatrist is providing close team supervision and 24 hour management of active medical problems listed below.  Physiatrist and rehab team continue to assess barriers to discharge/monitor patient progress toward functional and medical goals  Care Tool:  Bathing     Body parts bathed by patient: Right arm, Left arm, Chest, Abdomen, Front perineal area, Right upper leg, Left upper leg, Face, Buttocks     Body parts n/a: Right lower leg, Left lower leg   Bathing assist Assist Level: Contact Guard/Touching assist     Upper Body Dressing/Undressing Upper body dressing   What is the patient wearing?: Pull over shirt    Upper body assist Assist Level: Set up assist    Lower Body Dressing/Undressing Lower body dressing      What is the patient wearing?: Pants     Lower body assist Assist for lower body dressing: Contact Guard/Touching assist(sit<stand at sink)     Toileting Toileting    Toileting assist Assist for toileting: Contact Guard/Touching assist     Transfers Chair/bed transfer  Transfers assist     Chair/bed transfer assist level: Contact Guard/Touching assist     Locomotion Ambulation   Ambulation assist      Assist level: Contact Guard/Touching assist Assistive device: Walker-rolling Max distance: 49ft   Walk 10 feet activity   Assist     Assist level: Contact Guard/Touching assist Assistive device: Walker-rolling, Prothesis   Walk 50 feet activity   Assist Walk 50 feet with 2 turns activity did not occur: Safety/medical concerns         Walk 150 feet activity   Assist Walk 150 feet activity did not occur: Safety/medical concerns         Walk 10 feet on  uneven surface  activity   Assist Walk 10 feet on uneven surfaces activity did not occur: Safety/medical concerns         Wheelchair     Assist Will patient use wheelchair at discharge?: Yes Type of Wheelchair: Manual    Wheelchair assist level: Supervision/Verbal cueing Max wheelchair distance: 150'    Wheelchair 50 feet with 2 turns activity    Assist        Assist Level: Supervision/Verbal cueing   Wheelchair 150 feet activity     Assist     Assist Level: Supervision/Verbal cueing     Medical  Problem List and Plan: 1.  Deficits with mobility, transfers, self-care secondary to right BKA with history of left BKA   -Continue CIR therapies including PT, OT  2.  DVT Prophylaxis/Anticoagulation: Pharmaceutical: Heparin 3. Pain Management: Continue gabapentin nightly hydrocodone as needed 4. Mood: LCSW to follow for evaluation and support 5. Neuropsych: This patient is capable of making decisions on his own behalf. 6. Skin/Wound Care: Routine wound care.  Monitor for healing.  Maintain adequate nutrition and hydration status. 7. Fluids/Electrolytes/Nutrition: Renal/carb modified diet with 1200 cc if R.   -labs with HD  -encourage PO 8.  HTN: Monitor blood pressures twice daily.  Continue Coreg twice daily 9. Acute on chronic anemia: On Aransep.  Improved S/P transfusion 2 units PRBC preop- follow up hgb with HD labs  10.  T2DM:   Lantus 16 units twice daily with meal coverage and SSI monitor blood sugars AC/HS.   -sugars trending back up  - titrate lantus insulin to 18u bid 11. PVD s/p L-BKA/R-BKA new :On statin, ASA and Plavix.   12. ESRD: Continue hemodialysis TTS--at the end of the day to maximize participation with therapy  -Routine labs with hemodialysis.  13. Leukocytosis: may be reactive  -no signs of infection---observe for now  LOS: 5 days A FACE TO FACE EVALUATION WAS PERFORMED  Meredith Staggers 11/06/2018, 9:21 AM

## 2018-11-06 NOTE — Progress Notes (Signed)
Physical Therapy Session Note  Patient Details  Name: [name] MRN: [id number] Date of Birth: [birth year]  Today's Date: 11/06/2018 PT Individual Time: 0915-1015 PT Individual Time Calculation (min): 60 min   Short Term Goals: Week 1:  PT Short Term Goal 1 (Week 1): =LTGs due to ELOS  Skilled Therapeutic Interventions/Progress Updates:    Pt received seated in bed, agreeable to PT. No complaints of pain. Pt reports feeling very fatigued and weak since dialysis yesterday and was not able to participate much in OT session earlier this AM. Pt agreeable to get out of bed and attempt participation in PT session. Bed mobility Supervision. Setup A to don [name] prosthetic. Squat pivot transfer bed to w/c with CGA. Manual w/c propulsion 2 x 150 ft with BUE and Supervision. Squat pivot transfer w/c to/from mat table with CGA. Supine, sidelying, and prone RLE therex x 10 reps. Pt reports feeling liked he overdid it by participating in therapy session and requests to return to room to lay back down. Assisted pt back to bed, Supervision for bed mobility. Printed out handout for HEP for patient for BKA therex. Pt left seated in bed with needs in reach, bed alarm in place.  RLE therex: Supine: SLR, hip abd, SAQ x 10 reps each Sidelying: hip abd x 10 reps each Prone: hip ext, knee flex/ext x 10 reps each  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), NEW R BKA Restrictions Weight Bearing Restrictions: Yes RLE Weight Bearing: Non weight bearing   Therapy/Group: Individual Therapy  Excell Seltzer, PT, DPT  11/06/2018, 12:07 PM

## 2018-11-06 NOTE — Progress Notes (Signed)
Nutrition Follow-up  INTERVENTION:   - Continue Nepro Shake po BID, each supplement provides 425 kcal and 19 grams protein  - Continue renal MVI daily  - Continue Pro-stat 30 ml BID, each supplement provides 100 kcal and 15 grams of protein  - Double protein portions with all meals (RD ordered via [place])  NUTRITION DIAGNOSIS:   Increased nutrient needs related to wound healing as evidenced by increased estimated needs.  Ongoing  GOAL:   Patient will meet greater than or equal to 90% of their needs  Progressing  MONITOR:   PO intake, Supplement acceptance, Labs, Weight trends, Skin, I & O's  REASON FOR ASSESSMENT:   Malnutrition Screening Tool    ASSESSMENT:   57 year old male with history of HTN, PVD, T2DM with peripheral neuropathy, ESRD- HD TTS, left BKA --CIR 05/2017, PVD, right metatarsal foot amputation with recent admission for osteomyelitis due to MRSA infection; who was admitted from hemodialysis center with hypotension. Pt now s/p right BKA on 10/26/2018.  Clotted/nonfunctional tunneled dialysis catheter removed on 11/27 with placement of new dialysis catheter   Pt with 15 lb weight loss since admission. Suspect this is related in part to fluid status. Will continue to monitor weight trends.  Spoke with pt at bedside who reports having a good appetite and drinking Nepro shakes. Pt believes he drinks 1 Nepro shake daily. Pt states that he is taking the Pro-stat even though he does not like them.  Pt requested double protein portions with meals. RD ordered via [place].  Meal Completion: 25-100% x last 8 meals  Medications reviewed and include: calcitriol, Colace, Nepro BID, Pro-stat BID, SSI, Novolog 3 units TID with meals, Lantus 18 units BID, rena-vit, Protonix, Renvela TID  Labs reviewed: sodium 134 (L), phosphorus 7.4 (H), hemoglobin 7.9 (L) CBG's: 147, 256, 197, 122 x 24 hours  Net UF yesterday: 2362 ml I/O's: -3 L since  admission  Post-HD weight: 88.8 kg EDW: 91.5 kg  Diet Order:   Diet Order            Diet renal/carb modified with fluid restriction Diet-HS Snack? Nothing; Fluid restriction: 1200 mL Fluid; Room service appropriate? Yes; Fluid consistency: Thin  Diet effective now              EDUCATION NEEDS:   Education needs have been addressed  Skin:  Skin Assessment: Reviewed RN Assessment (incision L/R legs s/p BL BKAs)  Last BM:  12/4 (large type 6)  Height:   Ht Readings from Last 1 Encounters:  11/01/18 6' (1.829 m)    Weight:   Wt Readings from Last 1 Encounters:  11/06/18 89.5 kg    Ideal Body Weight:  71.2 kg (adjusted for BL BKAs)  BMI:  Body mass index is 26.76 kg/m.  Estimated Nutritional Needs:   Kcal:  6629-4765   Protein:  117-127 grams   Fluid:  UOP + 1L    Gaynell Face, MS, RD, LDN Inpatient Clinical Dietitian Pager: [phone] Weekend/After Hours: [phone]

## 2018-11-06 NOTE — Progress Notes (Signed)
Occupational Therapy Session Note  Patient Details  Name: [name] MRN: [id number] Date of Birth: May 23, 1961  Today's Date: 11/06/2018 OT Individual Time: 1300-1356 OT Individual Time Calculation (min): 56 min    Short Term Goals: Week 1:  OT Short Term Goal 1 (Week 1): STG = LTGs due to ELOS  Skilled Therapeutic Interventions/Progress Updates:    Upon entering the room, pt supine in bed and RN exiting the room. Pt received pain medication as pain increased to 10/10 in R residual limb described as throbbing. Pt declined OOB activity secondary to pain but agreeable to B UE strengthening exercises from bed level. OT provided paper handout HEP for exercises. Pt returning demonstrations for chest pulls, shoulder flexion, shoulder diagonals, bicep curls, and alternating punches with min verbal cuing for technique. Pt taking rest breaks as needed secondary to fatigue. Pt remained supine at end of session with call bell and all needed items within reach.    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), NEW R BKA Restrictions Weight Bearing Restrictions: Yes RLE Weight Bearing: Non weight bearing Pain: Pain Assessment Pain Scale: 0-10 Pain Score: 10-Worst pain ever Pain Type: Acute pain Pain Location: Leg Pain Orientation: Right Pain Descriptors / Indicators: Aching;Throbbing Pain Frequency: Intermittent Pain Onset: On-going Pain Intervention(s): Medication (See eMAR) ADL: ADL Eating: Independent Where Assessed-Eating: Wheelchair Grooming: Modified independent Where Assessed-Grooming: Sitting at sink Upper Body Bathing: Setup Where Assessed-Upper Body Bathing: Sitting at sink, Wheelchair Lower Body Bathing: Contact guard Where Assessed-Lower Body Bathing: Bed level Upper Body Dressing: Supervision/safety Where Assessed-Upper Body Dressing: Edge of bed Lower Body Dressing: Supervision/safety,  Setup Where Assessed-Lower Body Dressing: Bed level   Therapy/Group: Individual Therapy  Gypsy Decant 11/06/2018, 1:57 PM

## 2018-11-06 NOTE — Patient Care Conference (Signed)
Inpatient RehabilitationTeam Conference and Plan of Care Update Date: 11/05/2018   Time: 2:15 PM    Patient Name: [name]      Medical Record Number: [id number]  Date of Birth: [birth year] Sex: Male         Room/Bed: 4W16C/4W16C-01 Payor Info: Payor: MEDICARE / Plan: MEDICARE PART A / Product Type: *No Product type* /    Admitting Diagnosis: BKA  Admit Date/Time:  11/01/2018  6:29 PM Admission Comments: No comment available   Primary Diagnosis:  <principal problem not specified> Principal Problem: <principal problem not specified>  Patient Active Problem List   Diagnosis Date Noted  . Unilateral complete BKA, right, initial encounter ([place]) 11/01/2018  . Diabetes mellitus type 2 in nonobese (HCC)   . Postoperative pain   . Neuropathic pain   . PVD (peripheral vascular disease) ([place]) 10/23/2018  . Hypokalemia 10/23/2018  . ESRD (end stage renal disease) ([place]) 10/23/2018  . Chronic anemia 10/23/2018  . Ambulatory dysfunction 10/23/2018  . GERD (gastroesophageal reflux disease) 10/23/2018  . Right foot pain 10/22/2018  . Diabetic foot ulcer ([place]) 09/09/2018  . Hypocalcemia 09/09/2018  . Dehydration 09/09/2018  . Amputation of left lower extremity below knee ([place]) 06/04/2017  . Unilateral complete BKA, left, sequela ([place])   . Abnormality of gait   . Phantom limb pain ([place])   . Chronic kidney disease (CKD), stage IV (severe) ([place])   . Type 2 diabetes mellitus ([place])   . HLD (hyperlipidemia)   . Drug-induced constipation   . S/P unilateral BKA (below knee amputation), left ([place])   . Benign essential HTN   . Post-operative pain   . Acute blood loss anemia   . S/P BKA (below knee amputation), left ([place]) 05/30/2017  . DM type 2 causing CKD stage 4 ([place]) 05/30/2017  . Diabetic infection of left foot ([place])   . Left foot infection 03/30/2017  . AKI (acute kidney injury) ([place]) 03/30/2017  . Hyponatremia 03/30/2017  . HTN (hypertension) 03/30/2017  . Necrosis of toe ([place])  12/18/2013  . Osteomyelitis ([place]) 12/18/2013  . Leukocytosis 12/18/2013  . Anemia of chronic disease 12/18/2013  . CKD (chronic kidney disease) stage 4, GFR 15-29 ml/min ([place]) 12/18/2013    Expected Discharge Date: Expected Discharge Date: 11/09/18  Team Members Present: Physician leading conference: Dr. Alger Simons Social Worker Present: Lennart Pall, LCSW Nurse Present: Dorthula Nettles, RN PT Present: Other (comment)(Taylor Ervin Knack, PT) OT Present: Darleen Crocker, OT PPS Coordinator present : Gunnar Fusi;Daiva Nakayama, RN, CRRN     Current Status/Progress Goal Weekly Team Focus  Medical   Status post right below-knee amputation.  Patient with diabetes type 2 as well as end-stage renal disease on hemodialysis.  Maximize mobility and activity tolerance  Wound care and stump management, pain control, hemodialysis after therapies.   Bowel/Bladder   continent of bowel and bladder LBM 11-04-18  remain continent of bowel and bladder maintain regular bowel pattern  Assist with toileting need prn   Swallow/Nutrition/ Hydration             ADL's   Steady assist squat pivot toilet transfers + BADLs w/c level at sink. Supervision toileting.   Mod I   Adaptive bathing/dressing skills, d/c planning, general strengthening and endurance, pt education    Mobility   Supervision to CGA with squat pivot transfer, CGA for sit to stand to RW  Mod I at w/c level  increasing independence with transfers, w/c mobility, gait, safety   Communication  Safety/Cognition/ Behavioral Observations            Pain   pain managed with prn tramdol and norco   pain <4  Assess pain q shift and prn   Skin   right BKA sutures and ace wrap  healing BKA, no new skin issues  Assess skin q shift and perform dressing changes as order, assess righ BKA q shift    Rehab Goals Patient on target to meet rehab goals: Yes *See Care Plan and progress notes for long and short-term goals.     Barriers to  Discharge  Current Status/Progress Possible Resolutions Date Resolved   Physician    Wound Care        Ongoing medical management as outlined in the chart      Nursing                  PT  Decreased caregiver support;Other (comments)  wife unable to provide assist 2/2 physical and psychological impairments, pt has dialysis 3x/week              OT                  SLP                SW                Discharge Planning/Teaching Needs:  Pt to return home wtih only intermittent support from friends and wife.  NA if pt able to reach mod ind goals.   Team Discussion:  Pain management continues;  Supervision to CGA overall with transfers and amb ~ 15' with CGA.  Pt has very strong UB and this is very beneficial for tub transfers!  On track to meet mod ind goals.  Revisions to Treatment Plan:  NA    Continued Need for Acute Rehabilitation Level of Care: The patient requires daily medical management by a physician with specialized training in physical medicine and rehabilitation for the following conditions: Daily direction of a multidisciplinary physical rehabilitation program to ensure safe treatment while eliciting the highest outcome that is of practical value to the patient.: Yes Daily medical management of patient stability for increased activity during participation in an intensive rehabilitation regime.: Yes Daily analysis of laboratory values and/or radiology reports with any subsequent need for medication adjustment of medical intervention for : Wound care problems;Other;Renal problems   I attest that I was present, lead the team conference, and concur with the assessment and plan of the team.   Chico Cawood 11/06/2018, 4:00 PM

## 2018-11-06 NOTE — Progress Notes (Signed)
Occupational Therapy Session Note  Patient Details  Name: [name] MRN: [id number] Date of Birth: 11/11/1961  Today's Date: 11/06/2018 OT Individual Time: 0705-0730 OT Individual Time Calculation (min): 25 min  and Today's Date: 11/06/2018 OT Missed Time: 50 Minutes Missed Time Reason: Patient fatigue   Short Term Goals: Week 1:  OT Short Term Goal 1 (Week 1): STG = LTGs due to ELOS  Skilled Therapeutic Interventions/Progress Updates:    Upon entering the room, pt supine in bed with no c/o pain. Pt reports not feeling well and feeling "washed out" from dialysis yesterday. Pt verbalized not eating dinner and just trying to rest last night but continuing to feel unwell. OT encouraged pt to start breakfast this morning and pt reluctantly agreed. Pt performed bed mobility with overall supervision and OT initiated education regarding energy conservation. Pt returning to bed after completing breakfast secondary to fatigue and declining further OT intervention at this time.   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), NEW R BKA Restrictions Weight Bearing Restrictions: Yes RLE Weight Bearing: Non weight bearing General: General OT Amount of Missed Time: 50 Minutes Vital Signs: Therapy Vitals Temp: 98.1 F (36.7 C) Temp Source: Oral Pulse Rate: 72 Resp: 17 BP: 123/72 Patient Position (if appropriate): Lying Oxygen Therapy SpO2: 98 % O2 Device: Room Air Pain:   ADL: ADL Eating: Independent Where Assessed-Eating: Wheelchair Grooming: Modified independent Where Assessed-Grooming: Sitting at sink Upper Body Bathing: Setup Where Assessed-Upper Body Bathing: Sitting at sink, Wheelchair Lower Body Bathing: Contact guard Where Assessed-Lower Body Bathing: Bed level Upper Body Dressing: Supervision/safety Where Assessed-Upper Body Dressing: Edge of bed Lower Body Dressing: Supervision/safety,  Setup Where Assessed-Lower Body Dressing: Bed level   Therapy/Group: Individual Therapy  Gypsy Decant 11/06/2018, 7:36 AM

## 2018-11-06 NOTE — Progress Notes (Signed)
[facility] Progress Note   Subjective:  Seen in room. Says dialysis "wore me out yesterday", perhaps too much UF? Denies CP/dyspnea.   Objective Vitals:   11/05/18 1830 11/05/18 1938 11/06/18 0537 11/06/18 0554  BP: 127/67 118/68 123/72   Pulse: 66 68 72   Resp: 16 19 17    Temp:  97.9 F (36.6 C) 98.1 F (36.7 C)   TempSrc:  Oral Oral   SpO2: 100% 100% 98%   Weight:    89.5 kg  Height:       Physical Exam General:Well appearing man, NAD Heart:RRR; no murmur Lungs:CTAB Extremities:B BKA, no stump edema Dialysis Access:AVF + thrill, TDC in place  Additional Objective Labs: Basic Metabolic Panel: Recent Labs  Lab 11/01/18 0658 11/02/18 1329 11/05/18 1435  NA 138 137 134*  K 4.5 4.8 4.5  CL 104 102 99  CO2 22 19* 18*  GLUCOSE 177* 106* 169*  BUN 96* 112* 130*  CREATININE 8.09* 8.34* 9.33*  CALCIUM 8.5* 8.5* 8.7*  PHOS  --  6.6* 7.4*   Liver Function Tests: Recent Labs  Lab 11/02/18 1329 11/05/18 1435  ALBUMIN 2.2* 2.4*   CBC: Recent Labs  Lab 11/01/18 0658 11/02/18 1328 11/05/18 1436  WBC 11.7* 11.3* 13.1*  NEUTROABS 7.7  --   --   HGB 7.9* 7.7* 7.9*  HCT 27.6* 27.1* 27.5*  MCV 78.6* 79.7* 80.4  PLT 333 331 329   CBG: Recent Labs  Lab 11/05/18 0642 11/05/18 1158 11/05/18 1804 11/05/18 2113 11/06/18 0620  GLUCAP 135* 122* 197* 256* 147*   Medications: . sodium chloride    . sodium chloride    . sodium chloride     . aspirin EC  81 mg Oral Daily  . atorvastatin  10 mg Oral QHS  . calcitRIOL  0.5 mcg Oral Q T,Th,Sa-HD  . carvedilol  12.5 mg Oral BID WC  . Chlorhexidine Gluconate Cloth  6 each Topical Q0600  . clopidogrel  75 mg Oral Daily  . darbepoetin (ARANESP) injection - DIALYSIS  200 mcg Intravenous Q Sat-HD  . docusate sodium  100 mg Oral BID  . feeding supplement (NEPRO CARB STEADY)  237 mL Oral BID BM  . feeding supplement (PRO-STAT SUGAR FREE 64)  30 mL Oral BID  . gabapentin  300 mg Oral QHS  . [START  ON 11/07/2018] heparin  4,200 Units Intracatheter Q T,Th,Sa-HD  . heparin  5,000 Units Subcutaneous Q8H  . insulin aspart  0-9 Units Subcutaneous TID WC  . insulin aspart  3 Units Subcutaneous TID WC  . insulin glargine  18 Units Subcutaneous BID  . multivitamin  1 tablet Oral QHS  . pantoprazole  40 mg Oral Daily  . sevelamer carbonate  800 mg Oral TID WC    Dialysis Orders: GOC TThS 4:15hr,180NRe,400/Autoflow 2.0, SLH73.4KA,7G/8TL,XBW TDC/Maturing L AVF  - heparin 2600 unit bolus q HD - Calcitriol 0.82mcgPO TIW -Mircera 225 mcg IV q 2 weeks  Assessment/Plan: 1.Right foot Osteo/PAD: S/p RBKA 11/23, now double amputee. Finished course of Vanc 11/25. 2. ESRD: Continue HD per TTS schedule, next 12/5. TDCreplaced 11/27, AVF maturing. 3. Anemia: S/p 2U PRBCs on 11/22. On Aranesp 225mcg weekly for now. Hgb 7.9 4. Secondary hyperparathyroidism: CorrCa ok, Phos high. ^ dose Renvela, continue VDRA. 5.HTN/volume: BP controlled, no edema. Wiped out s/p HD -- plan for slightly less UF next time. 6. Nutrition: Alb low, continue supplements.  Veneta Penton, PA-C 11/06/2018, 9:35 AM  [employer] Pager: [phone]

## 2018-11-07 ENCOUNTER — Inpatient Hospital Stay (HOSPITAL_COMMUNITY): Payer: Self-pay

## 2018-11-07 ENCOUNTER — Inpatient Hospital Stay (HOSPITAL_COMMUNITY): Payer: Self-pay | Admitting: Physical Therapy

## 2018-11-07 ENCOUNTER — Inpatient Hospital Stay (HOSPITAL_COMMUNITY): Payer: Self-pay | Admitting: Occupational Therapy

## 2018-11-07 LAB — GLUCOSE, CAPILLARY
Glucose-Capillary: 124 mg/dL — ABNORMAL HIGH (ref 70–99)
Glucose-Capillary: 163 mg/dL — ABNORMAL HIGH (ref 70–99)
Glucose-Capillary: 96 mg/dL (ref 70–99)

## 2018-11-07 LAB — CBC
HCT: 30.5 % — ABNORMAL LOW (ref 39.0–52.0)
Hemoglobin: 8.7 g/dL — ABNORMAL LOW (ref 13.0–17.0)
MCH: 22.8 pg — ABNORMAL LOW (ref 26.0–34.0)
MCHC: 28.5 g/dL — AB (ref 30.0–36.0)
MCV: 79.8 fL — ABNORMAL LOW (ref 80.0–100.0)
Platelets: 347 10*3/uL (ref 150–400)
RBC: 3.82 MIL/uL — ABNORMAL LOW (ref 4.22–5.81)
RDW: 18.7 % — ABNORMAL HIGH (ref 11.5–15.5)
WBC: 13.4 10*3/uL — ABNORMAL HIGH (ref 4.0–10.5)
nRBC: 0.1 % (ref 0.0–0.2)

## 2018-11-07 LAB — RENAL FUNCTION PANEL
Albumin: 2.7 g/dL — ABNORMAL LOW (ref 3.5–5.0)
Anion gap: 18 — ABNORMAL HIGH (ref 5–15)
BUN: 92 mg/dL — AB (ref 6–20)
CO2: 21 mmol/L — ABNORMAL LOW (ref 22–32)
Calcium: 8.8 mg/dL — ABNORMAL LOW (ref 8.9–10.3)
Chloride: 95 mmol/L — ABNORMAL LOW (ref 98–111)
Creatinine, Ser: 8.53 mg/dL — ABNORMAL HIGH (ref 0.61–1.24)
GFR calc Af Amer: 7 mL/min — ABNORMAL LOW (ref >60)
GFR calc non Af Amer: 6 mL/min — ABNORMAL LOW (ref >60)
Glucose, Bld: 171 mg/dL — ABNORMAL HIGH (ref 70–99)
POTASSIUM: 4 mmol/L (ref 3.5–5.1)
Phosphorus: 6.9 mg/dL — ABNORMAL HIGH (ref 2.5–4.6)
Sodium: 134 mmol/L — ABNORMAL LOW (ref 135–145)

## 2018-11-07 MED ORDER — HEPARIN SODIUM (PORCINE) 1000 UNIT/ML DIALYSIS
20.0000 [IU]/kg | INTRAMUSCULAR | Status: DC | PRN
  Administered 2018-11-07: 1800 [IU] via INTRAVENOUS_CENTRAL
  Filled 2018-11-07 (×2): qty 2

## 2018-11-07 MED ORDER — HEPARIN SODIUM (PORCINE) 1000 UNIT/ML IJ SOLN
INTRAMUSCULAR | Status: AC
  Administered 2018-11-07: 1800 [IU] via INTRAVENOUS_CENTRAL
  Filled 2018-11-07: qty 2

## 2018-11-07 MED ORDER — CALCITRIOL 0.5 MCG PO CAPS
ORAL_CAPSULE | ORAL | Status: AC
  Administered 2018-11-07: 0.5 ug via ORAL
  Filled 2018-11-07: qty 1

## 2018-11-07 MED ORDER — HEPARIN SODIUM (PORCINE) 1000 UNIT/ML IJ SOLN
INTRAMUSCULAR | Status: AC
  Administered 2018-11-07: 4200 [IU]
  Filled 2018-11-07: qty 4

## 2018-11-07 NOTE — Progress Notes (Signed)
Occupational Therapy Session Note  Patient Details  Name: [name] MRN: [id number] Date of Birth: 04/03/1961  Today's Date: 11/07/2018 OT Individual Time: 3614-4315 OT Individual Time Calculation (min): 71 min    Short Term Goals: Week 1:  OT Short Term Goal 1 (Week 1): STG = LTGs due to ELOS  Skilled Therapeutic Interventions/Progress Updates:    Upon entering the room, pt supine in bed with no c/o pain and agreeable to OT intervention. Pt performed supine >sit with supervision and donned L LE prosthesis from EOB independently. Pt standing with min guard and ambulating with RW to sink with steady assistance for balance. Pt bathing self at sink at mod I level. Pt able to partially dress LB at sink and returned to bed to roll to pull clothing over B hips independently. Pt verbalized this is how he did it at home and wheelchair will fit into bedroom door. OT continued to educate pt that wheelchair level will be safest at home and will need someone with him when ambulating with RW in home and into bathroom. Pt verbalized understanding. Pt remaining in bed at end of session with call bell and all needed items within reach.   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), NEW R BKA Restrictions Weight Bearing Restrictions: Yes RLE Weight Bearing: Non weight bearing General:   Vital Signs: Therapy Vitals Temp: 97.9 F (36.6 C) Temp Source: Oral Pulse Rate: 64 Resp: 18 BP: 140/71 Patient Position (if appropriate): Lying Oxygen Therapy SpO2: 98 % O2 Device: Room Air Pain: Pain Assessment Pain Scale: 0-10 Pain Score: 9  Pain Type: Acute pain Pain Location: Hip Pain Orientation: Right Pain Descriptors / Indicators: Aching Pain Frequency: Constant Pain Intervention(s): Medication (See eMAR) ADL: ADL Eating: Independent Where Assessed-Eating: Wheelchair Grooming: Modified independent Where  Assessed-Grooming: Sitting at sink Upper Body Bathing: Setup Where Assessed-Upper Body Bathing: Sitting at sink, Wheelchair Lower Body Bathing: Contact guard Where Assessed-Lower Body Bathing: Bed level Upper Body Dressing: Supervision/safety Where Assessed-Upper Body Dressing: Edge of bed Lower Body Dressing: Supervision/safety, Setup Where Assessed-Lower Body Dressing: Bed level Vision   Perception    Praxis   Exercises:   Other Treatments:     Therapy/Group: Individual Therapy  Gypsy Decant 11/07/2018, 8:15 AM

## 2018-11-07 NOTE — Progress Notes (Signed)
Social Work Patient ID: [name], male   DOB: 27-Apr-1961, 57 y.o.   MRN: [id number]  Have reviewed team conference with pt. He is aware and agreeable with targeted d/c date of 11/09/18 and goals of modified independent.  Will arrange [place], however, patient without Part B of Medicare, therefore, has no DME coverage.  Pt aware.   Yeudiel Mateo, LCSW

## 2018-11-07 NOTE — Plan of Care (Signed)
  Problem: RH BOWEL ELIMINATION Goal: RH STG MANAGE BOWEL WITH ASSISTANCE Description STG Manage Bowel with Assistance. Mod I  Outcome: Progressing   Problem: RH SKIN INTEGRITY Goal: RH STG SKIN FREE OF INFECTION/BREAKDOWN Description Skin free of infection and breakdown MOd I  Outcome: Progressing Goal: RH STG ABLE TO PERFORM INCISION/WOUND CARE W/ASSISTANCE Description STG Able To Perform Incision/Wound Care With Assistance. Mod I  Outcome: Progressing   Problem: RH SAFETY Goal: RH STG ADHERE TO SAFETY PRECAUTIONS W/ASSISTANCE/DEVICE Description STG Adhere to Safety Precautions With Assistance/Device. Mod I  Outcome: Progressing   Problem: RH PAIN MANAGEMENT Goal: RH STG PAIN MANAGED AT OR BELOW PT'S PAIN GOAL Description Less than 5  Outcome: Progressing   Problem: RH KNOWLEDGE DEFICIT LIMB LOSS Goal: RH STG INCREASE KNOWLEDGE OF SELF CARE AFTER LIMB LOSS Description Patient will be able to demonstrate care of incision and residual limb, verbalize pain management strategies with cues/handouts  Outcome: Progressing   Problem: Consults Goal: RH LIMB LOSS PATIENT EDUCATION Description Description: See Patient Education module for eduction specifics. Outcome: Progressing

## 2018-11-07 NOTE — Progress Notes (Signed)
[place] PHYSICAL MEDICINE & REHABILITATION PROGRESS NOTE   Subjective/Complaints: No new issues. Up shaving. Pain reasonable.   ROS: Patient denies fever, rash, sore throat, blurred vision, nausea, vomiting, diarrhea, cough, shortness of breath or chest pain, joint or back pain, headache, or mood change.   Objective:   No results found. Recent Labs    11/05/18 1436  WBC 13.1*  HGB 7.9*  HCT 27.5*  PLT 329   Recent Labs    11/05/18 1435  NA 134*  K 4.5  CL 99  CO2 18*  GLUCOSE 169*  BUN 130*  CREATININE 9.33*  CALCIUM 8.7*    Intake/Output Summary (Last 24 hours) at 11/07/2018 1058 Last data filed at 11/07/2018 0843 Gross per 24 hour  Intake 770 ml  Output -  Net 770 ml     Physical Exam: Vital Signs Blood pressure (!) 157/80, pulse 70, temperature 97.9 F (36.6 C), temperature source Oral, resp. rate 18, height 6' (1.829 m), weight 90.2 kg, SpO2 98 %.  Constitutional: No distress . Vital signs reviewed. HEENT: EOMI, oral membranes moist Neck: supple Cardiovascular: RRR without murmur. No JVD    Respiratory: CTA Bilaterally without wheezes or rales. Normal effort    GI: BS +, non-tender, non-distended  Musculoskeletal:  [name] without edema, normal shape Neurological: He is alert and oriented to person, place, and time.  Motor: BL UE 5/5 proximal to distal RLE: HF, KE 4+/5 RLE: HF approx 4-/5  Skin:  R-BKA with clean,dry incision, stump well formed, minimal edema Psychiatric: pleasant.     Assessment/Plan: 1. Functional deficits secondary to right BKA which require 3+ hours per day of interdisciplinary therapy in a comprehensive inpatient rehab setting.  Physiatrist is providing close team supervision and 24 hour management of active medical problems listed below.  Physiatrist and rehab team continue to assess barriers to discharge/monitor patient progress toward functional and medical goals  Care Tool:  Bathing    Body parts bathed by patient:  Right arm, Left arm, Chest, Abdomen, Front perineal area, Right upper leg, Left upper leg, Face, Buttocks     Body parts n/a: Right lower leg, Left lower leg   Bathing assist Assist Level: Independent     Upper Body Dressing/Undressing Upper body dressing   What is the patient wearing?: Pull over shirt    Upper body assist Assist Level: Independent    Lower Body Dressing/Undressing Lower body dressing      What is the patient wearing?: Pants, Underwear/pull up     Lower body assist Assist for lower body dressing: Independent     Toileting Toileting    Toileting assist Assist for toileting: Contact Guard/Touching assist     Transfers Chair/bed transfer  Transfers assist     Chair/bed transfer assist level: Supervision/Verbal cueing     Locomotion Ambulation   Ambulation assist      Assist level: Contact Guard/Touching assist Assistive device: Walker-rolling Max distance: 15'   Walk 10 feet activity   Assist     Assist level: Contact Guard/Touching assist Assistive device: Walker-rolling, Prothesis   Walk 50 feet activity   Assist Walk 50 feet with 2 turns activity did not occur: Safety/medical concerns         Walk 150 feet activity   Assist Walk 150 feet activity did not occur: Safety/medical concerns         Walk 10 feet on uneven surface  activity   Assist Walk 10 feet on uneven surfaces activity did not occur:  Safety/medical concerns         Wheelchair     Assist Will patient use wheelchair at discharge?: Yes Type of Wheelchair: Manual    Wheelchair assist level: Supervision/Verbal cueing Max wheelchair distance: 150'    Wheelchair 50 feet with 2 turns activity    Assist        Assist Level: Supervision/Verbal cueing   Wheelchair 150 feet activity     Assist     Assist Level: Supervision/Verbal cueing     Medical Problem List and Plan: 1.  Deficits with mobility, transfers, self-care  secondary to right BKA with history of left BKA   -Continue CIR therapies including PT, OT   -ELOS 12/7 2.  DVT Prophylaxis/Anticoagulation: Pharmaceutical: Heparin 3. Pain Management: Continue gabapentin nightly hydrocodone as needed 4. Mood: LCSW to follow for evaluation and support 5. Neuropsych: This patient is capable of making decisions on his own behalf. 6. Skin/Wound Care: Routine wound care.  Monitor for healing.  Maintain adequate nutrition and hydration status. 7. Fluids/Electrolytes/Nutrition: Renal/carb modified diet with 1200 cc if R.   -labs with HD  -encourage PO 8.  HTN: Monitor blood pressures twice daily.  Continue Coreg twice daily 9. Acute on chronic anemia: On Aransep.  Improved S/P transfusion 2 units PRBC preop- follow up hgb with HD labs  10.  T2DM:   Lantus 16 units twice daily with meal coverage and SSI monitor blood sugars AC/HS.   - titrated lantus insulin to 18u bid with improvement 11. PVD s/p L-BKA/R-BKA new :On statin, ASA and Plavix.   12. ESRD: Continue hemodialysis TTS--at the end of the day to maximize participation with therapy  -Routine labs with hemodialysis.  13. Leukocytosis: may be reactive  -no signs of infection---continue observation  LOS: 6 days A FACE TO FACE EVALUATION WAS PERFORMED  Meredith Staggers 11/07/2018, 10:58 AM

## 2018-11-07 NOTE — Progress Notes (Signed)
Physical Therapy Session Note  Patient Details  Name: [name] MRN: [id number] Date of Birth: [birth year]  Today's Date: 11/07/2018 PT Individual Time: 0930-1030 PT Individual Time Calculation (min): 60 min   Short Term Goals: Week 1:  PT Short Term Goal 1 (Week 1): =LTGs due to ELOS      Skilled Therapeutic Interventions/Progress Updates:   Pt resting in bed.  Bed mobility wearing L prosthesis, supervision.  Squat pivot bed> w/c with CG/min assist to L.  W/c propulsion x 150' , x 50', using bil UEs on level tile, with cues for efficiency and turns.  Cues to remember brakes before reaching out of BOS and manipulating legrests on/off w/c.  Simulated car transfer with mod assist due to difficulty elevating hips, when wearing L prosthesis (long leg prevents transfer of weight onto foot).  Pt declined using RW as he stated this will be hard for helpers; " my wife or friends can deal with the w/c or the walker, not both".  Lateral scoot transfer w/c> mat , supervision, with cues for head/hips relationship, positioning w/c exactly to his advantage.  Pt attempted scooting laterally on mat without use of UEs; he is unable to elevate hips.  Scooting laterally R/L using bil UEs with supervision.   Seated strengthening exercises: 15 x 1 bil adductor squeezes, R short arc quad knee extension with isometric hold at end range, L long arc quad knee extension.   Seated dynamic balance, reaching forward and to R for objects on floor, safely with supervision.  PT discussed with pt avoiding quick movements which challenge balance, even while wearing L prosthesis.  Sit> stand from 24" , 23" , 22' heights of a firm mat, with RW.  Pt tolerated standing < 10 seconds q trial.  Pt left resting in w/c with friends visiting and RN in attendance.     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), NEW R BKA Restrictions Weight  Bearing Restrictions: Yes RLE Weight Bearing: Non weight bearing    Pain: Pain Assessment Pain Scale: 0-10 Pain Score: 5 Pain Type: Acute pain Pain Location: (Patient states he's having pain in hisright leg &stump) Pain Orientation: Right Pain Descriptors / Indicators: Constant;Discomfort;Aching Pain Frequency: Constant Pain Onset: On-going Pain Intervention(s): Medication (See eMAR)    Therapy/Group: Individual Therapy  Shantera Monts 11/07/2018, 10:47 AM

## 2018-11-07 NOTE — Procedures (Signed)
I was present at this dialysis session. I have reviewed the session itself and made appropriate changes.   Vital signs in last 24 hours:  Temp:  [97.9 F (36.6 C)-98.2 F (36.8 C)] 98.1 F (36.7 C) (12/05 1230) Pulse Rate:  [64-75] 74 (12/05 1330) Resp:  [16-18] 18 (12/05 1230) BP: (108-157)/(60-80) 108/60 (12/05 1330) SpO2:  [98 %-100 %] 98 % (12/05 1230) Weight:  [89.5 kg-90.5 kg] 90.5 kg (12/05 1230) Weight change: -2 kg Filed Weights   11/06/18 1437 11/07/18 0600 11/07/18 1230  Weight: 89.5 kg 90.2 kg 90.5 kg    Recent Labs  Lab 11/07/18 1230  NA 134*  K 4.0  CL 95*  CO2 21*  GLUCOSE 171*  BUN 92*  CREATININE 8.53*  CALCIUM 8.8*  PHOS 6.9*    Recent Labs  Lab 11/01/18 0658 11/02/18 1328 11/05/18 1436 11/07/18 1230  WBC 11.7* 11.3* 13.1* 13.4*  NEUTROABS 7.7  --   --   --   HGB 7.9* 7.7* 7.9* 8.7*  HCT 27.6* 27.1* 27.5* 30.5*  MCV 78.6* 79.7* 80.4 79.8*  PLT 333 331 329 347    Scheduled Meds: . aspirin EC  81 mg Oral Daily  . atorvastatin  10 mg Oral QHS  . calcitRIOL  0.5 mcg Oral Q T,Th,Sa-HD  . carvedilol  12.5 mg Oral BID WC  . Chlorhexidine Gluconate Cloth  6 each Topical Q0600  . clopidogrel  75 mg Oral Daily  . darbepoetin (ARANESP) injection - DIALYSIS  200 mcg Intravenous Q Sat-HD  . docusate sodium  100 mg Oral BID  . feeding supplement (NEPRO CARB STEADY)  237 mL Oral BID WC  . feeding supplement (PRO-STAT SUGAR FREE 64)  30 mL Oral BID  . gabapentin  300 mg Oral QHS  . heparin  4,200 Units Intracatheter Q T,Th,Sa-HD  . heparin  5,000 Units Subcutaneous Q8H  . insulin aspart  0-9 Units Subcutaneous TID WC  . insulin aspart  3 Units Subcutaneous TID WC  . insulin glargine  18 Units Subcutaneous BID  . multivitamin  1 tablet Oral QHS  . pantoprazole  40 mg Oral Daily  . sevelamer carbonate  2,400 mg Oral TID WC   Continuous Infusions: . sodium chloride    . sodium chloride    . sodium chloride     PRN Meds:.sodium chloride, sodium  chloride, sodium chloride, acetaminophen, alteplase, aluminum hydroxide, bisacodyl, diphenhydrAMINE, guaiFENesin-dextromethorphan, heparin, HYDROcodone-acetaminophen, lidocaine (PF), lidocaine-prilocaine, ondansetron **OR** ondansetron (ZOFRAN) IV, pentafluoroprop-tetrafluoroeth, polyethylene glycol, prochlorperazine **OR** prochlorperazine **OR** prochlorperazine, traMADol, traZODone   Donetta Potts,  MD 11/07/2018, 1:53 PM

## 2018-11-07 NOTE — Progress Notes (Signed)
Physical Therapy Session Note  Patient Details  Name: [name] MRN: [id number] Date of Birth: 1961-03-12  Today's Date: 11/07/2018 PT Individual Time: 1100-1200 PT Individual Time Calculation (min):  60 min  Short Term Goals: Week 1:  PT Short Term Goal 1 (Week 1): =LTGs due to ELOS  Skilled Therapeutic Interventions/Progress Updates:    Pt received seated in w/c in room, agreeable to PT. No complaints of pain and pt appears to be in better spirits today with presence of visitors. Manual w/c propulsion x 150 ft with BUE and Supervision. Squat pivot transfer w/c to/from mat table with Supervision. Sit to stand with Supervision to RW. Ambulation 2 x 25 ft with RW and Supervision. Car transfer with RW and mod A to stand to RW from car seat due to low height. Car transfer squat pivot w/c to/from car with Supervision. Supine BLE strengthening therex 2 x 10 reps: SLR, hip abd, hip add squeeze, quad sets. Toilet transfer with Supervision from elevated commode over toilet seat. Assisted pt back to bed at end of therapy session, Supervision for squat pivot transfer. Pt left seated EOB with needs in reach, bed alarm in place.  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

## 2018-11-08 ENCOUNTER — Inpatient Hospital Stay (HOSPITAL_COMMUNITY): Payer: Self-pay

## 2018-11-08 ENCOUNTER — Inpatient Hospital Stay (HOSPITAL_COMMUNITY): Payer: Self-pay | Admitting: Occupational Therapy

## 2018-11-08 ENCOUNTER — Inpatient Hospital Stay (HOSPITAL_COMMUNITY): Payer: Self-pay | Admitting: Physical Therapy

## 2018-11-08 LAB — GLUCOSE, CAPILLARY
GLUCOSE-CAPILLARY: 138 mg/dL — AB (ref 70–99)
Glucose-Capillary: 150 mg/dL — ABNORMAL HIGH (ref 70–99)
Glucose-Capillary: 208 mg/dL — ABNORMAL HIGH (ref 70–99)
Glucose-Capillary: 122 mg/dL — ABNORMAL HIGH (ref 70–99)
Glucose-Capillary: 97 mg/dL (ref 70–99)

## 2018-11-08 MED ORDER — SEVELAMER CARBONATE 800 MG PO TABS: 2400.0000 mg | ORAL_TABLET | Freq: Three times a day (TID) | ORAL | 0 refills | Status: DC

## 2018-11-08 MED ORDER — RENA-VITE PO TABS: 1.0000 | ORAL_TABLET | Freq: Every day | ORAL | 0 refills | Status: DC

## 2018-11-08 MED ORDER — INSULIN GLARGINE 100 UNIT/ML ~~LOC~~ SOLN: 18.0000 [IU] | Freq: Two times a day (BID) | SUBCUTANEOUS | 0 refills | Status: DC

## 2018-11-08 MED ORDER — TRAMADOL HCL 50 MG PO TABS: 50.0000 mg | ORAL_TABLET | Freq: Four times a day (QID) | ORAL | 0 refills | Status: DC | PRN

## 2018-11-08 MED ORDER — HYDROCODONE-ACETAMINOPHEN 5-325 MG PO TABS: 1.0000 | ORAL_TABLET | Freq: Four times a day (QID) | ORAL | 0 refills | Status: DC | PRN

## 2018-11-08 MED ORDER — CARVEDILOL 12.5 MG PO TABS: 12.5000 mg | ORAL_TABLET | Freq: Two times a day (BID) | ORAL | 0 refills | Status: DC

## 2018-11-08 MED ORDER — DOCUSATE SODIUM 100 MG PO CAPS: 100.0000 mg | ORAL_CAPSULE | Freq: Two times a day (BID) | ORAL | 0 refills | Status: DC

## 2018-11-08 MED ORDER — ACETAMINOPHEN 325 MG PO TABS: 325.0000 mg | ORAL_TABLET | ORAL | Status: DC | PRN

## 2018-11-08 MED ORDER — ATORVASTATIN CALCIUM 10 MG PO TABS: 10.0000 mg | ORAL_TABLET | Freq: Every day | ORAL | 0 refills | Status: DC

## 2018-11-08 MED ORDER — GABAPENTIN 300 MG PO CAPS: 300.0000 mg | ORAL_CAPSULE | Freq: Every day | ORAL | 0 refills | Status: DC

## 2018-11-08 NOTE — Progress Notes (Signed)
Social Work  Discharge Note  The overall goal for the admission was met for:   Discharge location: Yes - home alone with intermittent assist from friends or wife  Length of Stay: Yes - 8 days (with discharge on 12/7)  Discharge activity level: Yes - mod ind w/c level overall  Home/community participation: Yes  Services provided included: MD, RD, PT, OT, RN, TR, Pharmacy and SW  Financial Services: Medicare (Part A only)  Follow-up services arranged: Home Health: RN, PT via [facility] and Patient/Family has no preference for HH/DME agencies  Comments (or additional information): Pt does have DME already at home, however, he had hoped to be able to get a newer w/c.  Have explained, and pt understands, that he does not have Medicare Part B and does not have coverage for DME.  He will use his existing DME  Patient/Family verbalized understanding of follow-up arrangements: Yes  Individual responsible for coordination of the follow-up plan: Pt  Confirmed correct DME delivered: NA Jaleea Alesi

## 2018-11-08 NOTE — Progress Notes (Signed)
Occupational Therapy Session Note  Patient Details  Name: [name] MRN: [id number] Date of Birth: 22-Sep-1961  Today's Date: 11/08/2018 OT Individual Time: 9432-7614 OT Individual Time Calculation (min): 43 min    Short Term Goals: Week 1:  OT Short Term Goal 1 (Week 1): STG = LTGs due to ELOS  Skilled Therapeutic Interventions/Progress Updates:    Patient in bed upon arrival.  Reviewed discharge plan and ongoing goals for home care therapy.  Reinforced opportunity for home care therapist to assist with HM/ADL/IADL and problem solve environmental obstacles.   Bed mobility mod I, SPT with RW and ambulation in room X2 with CG/CS - no LOB, able to don and doff prosthesis mod I.   At close of session patient returned to bed with lunch tray, bed alarm set and call bell accessible.  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

## 2018-11-08 NOTE — Progress Notes (Signed)
[facility] PHYSICAL MEDICINE & REHABILITATION PROGRESS NOTE   Subjective/Complaints: Sleepy again from HD yesterday. Otherwise doing well. No complaints  ROS: Patient denies fever, rash, sore throat, blurred vision, nausea, vomiting, diarrhea, cough, shortness of breath or chest pain,  back pain, headache, or mood change.    Objective:   No results found. Recent Labs    11/05/18 1436 11/07/18 1230  WBC 13.1* 13.4*  HGB 7.9* 8.7*  HCT 27.5* 30.5*  PLT 329 347   Recent Labs    11/05/18 1435 11/07/18 1230  NA 134* 134*  K 4.5 4.0  CL 99 95*  CO2 18* 21*  GLUCOSE 169* 171*  BUN 130* 92*  CREATININE 9.33* 8.53*  CALCIUM 8.7* 8.8*    Intake/Output Summary (Last 24 hours) at 11/08/2018 1110 Last data filed at 11/08/2018 0800 Gross per 24 hour  Intake 470 ml  Output 1000 ml  Net -530 ml     Physical Exam: Vital Signs Blood pressure 130/66, pulse 69, temperature 98.3 F (36.8 C), temperature source Oral, resp. rate 17, height 6' (1.829 m), weight 91.2 kg, SpO2 99 %.  Constitutional: No distress . Vital signs reviewed. HEENT: EOMI, oral membranes moist Neck: supple Cardiovascular: RRR without murmur. No JVD    Respiratory: CTA Bilaterally without wheezes or rales. Normal effort    GI: BS +, non-tender, non-distended   Musculoskeletal:  [name] without edema, normal shape Neurological: He is alert and oriented to person, place, and time.  Motor: BL UE 5/5 proximal to distal RLE: HF, KE 4+/5 RLE: HF approx 4-/5  Skin:  R-BKA with CDI.  Psychiatric: pleasant.     Assessment/Plan: 1. Functional deficits secondary to right BKA which require 3+ hours per day of interdisciplinary therapy in a comprehensive inpatient rehab setting.  Physiatrist is providing close team supervision and 24 hour management of active medical problems listed below.  Physiatrist and rehab team continue to assess barriers to discharge/monitor patient progress toward functional and medical  goals  Care Tool:  Bathing    Body parts bathed by patient: Right arm, Left arm, Chest, Abdomen, Front perineal area, Right upper leg, Left upper leg, Face, Buttocks     Body parts n/a: Right lower leg, Left lower leg   Bathing assist Assist Level: Independent     Upper Body Dressing/Undressing Upper body dressing   What is the patient wearing?: Pull over shirt    Upper body assist Assist Level: Independent    Lower Body Dressing/Undressing Lower body dressing      What is the patient wearing?: Pants, Underwear/pull up     Lower body assist Assist for lower body dressing: Independent     Toileting Toileting    Toileting assist Assist for toileting: Contact Guard/Touching assist     Transfers Chair/bed transfer  Transfers assist     Chair/bed transfer assist level: Supervision/Verbal cueing     Locomotion Ambulation   Ambulation assist      Assist level: Supervision/Verbal cueing Assistive device: Walker-rolling Max distance: 25'   Walk 10 feet activity   Assist     Assist level: Supervision/Verbal cueing Assistive device: Walker-rolling, Prothesis   Walk 50 feet activity   Assist Walk 50 feet with 2 turns activity did not occur: Safety/medical concerns         Walk 150 feet activity   Assist Walk 150 feet activity did not occur: Safety/medical concerns         Walk 10 feet on uneven surface  activity  Assist Walk 10 feet on uneven surfaces activity did not occur: Safety/medical concerns         Wheelchair     Assist Will patient use wheelchair at discharge?: Yes Type of Wheelchair: Manual    Wheelchair assist level: Supervision/Verbal cueing Max wheelchair distance: 150'    Wheelchair 50 feet with 2 turns activity    Assist        Assist Level: Supervision/Verbal cueing   Wheelchair 150 feet activity     Assist     Assist Level: Supervision/Verbal cueing     Medical Problem List and  Plan: 1.  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  2.  DVT Prophylaxis/Anticoagulation: Pharmaceutical: Heparin 3. Pain Management: Continue gabapentin nightly hydrocodone as needed 4. Mood: LCSW to follow for evaluation and support 5. Neuropsych: This patient is capable of making decisions on his own behalf. 6. Skin/Wound Care: Routine wound care.  Monitor for healing.  Maintain adequate nutrition and hydration status. 7. Fluids/Electrolytes/Nutrition: Renal/carb modified diet with 1200 ccFR.     -labs with HD    8.  HTN: Monitor blood pressures twice daily.  Continue Coreg twice daily 9. Acute on chronic anemia: On Aransep.  Improved S/P transfusion 2 units PRBC preop- follow up hgb with HD labs  10.  T2DM:   Lantus 16 units twice daily with meal coverage and SSI monitor blood sugars AC/HS.   - titrated lantus insulin to 18u bid with improvement prior to last night/this morning. Observe for further pattern before making another scheduled change.  11. PVD s/p L-BKA/R-BKA new :On statin, ASA and Plavix.   12. ESRD: Continue hemodialysis TTS  -HD at the end of the day to maximize participation with therapy  -Routine labs with hemodialysis.  13. Leukocytosis: may be reactive  -no signs of infection---continue observation  LOS: 7 days A FACE TO FACE EVALUATION WAS PERFORMED  Meredith Staggers 11/08/2018, 11:10 AM

## 2018-11-08 NOTE — Discharge Instructions (Signed)
Inpatient Rehab Discharge Instructions  [facility] Discharge date and time:  11/09/18  Activities/Precautions/ Functional Status: Activity: no lifting, driving, or strenuous exercise till cleared by MD Diet: diabetic diet and renal diet Wound Care: Wwash with soap and water. Pat dry--keep wound clean and dry. No lotions, ointments or oils on incision.  Contact Dr. Doran Durand if you develop any problems with your incision/wound--redness, swelling, increase in pain, drainage or if you develop fever or chills.   Functional status:  ___ No restrictions     ___ Walk up steps independently ___ 24/7 supervision/assistance   ___ Walk up steps with assistance _X__ Intermittent supervision/assistance  ___ Bathe/dress independently ___ Walk with walker     ___ Bathe/dress with assistance ___ Walk Independently    ___ Shower independently ___ Walk with assistance    _X__ Shower with assistance _X__ No alcohol     ___ Return to work/school ________    COMMUNITY REFERRALS UPON DISCHARGE:    Home Health:   PT    RN                     Agency:  [facility] Phone: [phone]    GENERAL COMMUNITY RESOURCES FOR PATIENT/FAMILY:  Support Groups:  Amputee Support Group (see handout for details)       Special Instructions: 1. Monitor blood sugars before meals and at bedtime. Follow up with primary MD for further adjustment in insulin.    My questions have been answered and I understand these instructions. I will adhere to these goals and the provided educational materials after my discharge from the hospital.  Patient/Caregiver Signature _______________________________ Date __________  Clinician Signature _______________________________________ Date __________  Please bring this form and your medication list with you to all your follow-up doctor's appointments.

## 2018-11-08 NOTE — Progress Notes (Signed)
Occupational Therapy Session Note  Patient Details  Name: [name] MRN: [id number] Date of Birth: 06-08-1961  Today's Date: 11/08/2018 OT Individual Time: 9791-5041 OT Individual Time Calculation (min): 42 min    Short Term Goals: Week 1:  OT Short Term Goal 1 (Week 1): STG = LTGs due to ELOS  Skilled Therapeutic Interventions/Progress Updates:    1:1. Pt received seated in bed. Pt with no c/o pain. Pt completes 3x30 passes of basketball for BUE strengthening and endurance with 1.5# wrist weights seated EOM (chest, boune and overhead pass) with rest break in between and supervision. Pt completes boxing activity 20 punches to target in long sitting for BUE endurance and core strengthening. Pt completes 3x10 of tricep extension w/c push ups with demo instruction for technique. All functional squat pivot transfers EOB<>w/c<>EOM  with supervision and min VC for hand placement/supervision. 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), NEW R BKA Restrictions Weight Bearing Restrictions: Yes RLE Weight Bearing: Non weight bearing General:   Vital Signs: Therapy Vitals Temp: 98.2 F (36.8 C) Temp Source: Oral Pulse Rate: 69 Resp: 18 BP: 113/72 Patient Position (if appropriate): Lying Oxygen Therapy SpO2: 100 % O2 Device: Room Air Pain: Pain Assessment Pain Scale: 0-10 Pain Score: 6  Pain Type: Surgical pain Pain Location: Incision Pain Orientation: Right Pain Descriptors / Indicators: Aching;Discomfort;Throbbing Pain Frequency: Constant Pain Onset: On-going Pain Intervention(s): Medication (See eMAR) ADL: ADL Eating: Not assessed Where Assessed-Eating: Wheelchair Grooming: Modified independent Where Assessed-Grooming: Sitting at sink Upper Body Bathing: Independent Where Assessed-Upper Body Bathing: Sitting at  sink, Wheelchair Lower Body Bathing: Independent Where Assessed-Lower Body Bathing: Sitting at sink, Wheelchair Upper Body Dressing: Independent Where Assessed-Upper Body Dressing: Wheelchair Lower Body Dressing: Independent Where Assessed-Lower Body Dressing: Bed level Toileting: Modified independent Where Assessed-Toileting: Glass blower/designer: Modified Programmer, applications Method: Engineer, water: Extra wide drop arm bedside commode Tub/Shower Transfer: Close supervison(using adaptive method) Vision Baseline Vision/History: Wears glasses Wears Glasses: Reading only Patient Visual Report: No change from baseline Vision Assessment?: No apparent visual deficits Perception  Perception: Within Functional Limits Praxis Praxis: Intact Exercises:   Other Treatments:     Therapy/Group: Individual Therapy  [name] 11/08/2018, 3:40 PM

## 2018-11-08 NOTE — Discharge Summary (Addendum)
Occupational Therapy Discharge Summary  Patient Details  Name: [name] MRN: [id number] Date of Birth: 16-Jan-1961  Today's Date: 11/08/2018 OT Individual Time: 1300-1355 OT Individual Time Calculation (min): 55 min    Patient has met 8 of 9 long term goals due to improved activity tolerance, improved balance, postural control, ability to compensate for deficits and improved awareness.  Patient to discharge at overall Modified Independent level.  Patient reports he will have intermittent supervision assist from spouse at time of d/c.   Reasons goals not met: Pt requires supervision assist for adaptive shower transfer method, and therefore Mod I transfer goal not met   Recommendation:  Patient will benefit from ongoing skilled OT services in home health setting to continue to advance functional skills in the area of iADL and Vocation.  Equipment: No equipment provided  Reasons for discharge: treatment goals met and discharge from hospital  Patient/family agrees with progress made and goals achieved: Yes   Skilled Therapeutic Intervention:  Pt greeted in bed, asleep, easily woken. Wanting to get washed up at the sink. He completed squat pivot<w/c and bathed at sink at Mod I level, utilizing lateral leans for perihygiene and exhibiting good carryover of w/c safety by locking brakes prior to dynamic activity. Afterwards, worked on endurance and UB strengthening by pt propelling w/c in room to engage in IADL tasks of bedmaking and cleaning room in prep for d/c. Pt reached outside of base of support to pack items from dresser drawers into personal bags. Min vcs for w/c safety when bending towards bottommost drawers. While he assisted OT with bedmaking, discussed leisure pursuits at home to fill his time in a meaningful way. 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 Discussed importance of social participation and purposeful  activity for psychosocial and physical health. Also discussed importance of daily residual limb inspection using [place] mirror for skin assessment. He feels comfortable with limb wrapping for Rt LE and did not want additional practice. Able to manage prosthetic without assist throughout session. At end of tx pt completed squat pivot<bed and transitioned to supine. Pt left with all needs within reach at session exit.      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 8/10. RN in to provide pain medicine at start of session  Pain Assessment Pain Scale: 0-10 Pain Score: 6  Pain Type: Surgical pain Pain Location: Incision Pain Orientation: Right Pain Descriptors / Indicators: Aching;Discomfort;Throbbing Pain Frequency: Constant Pain Onset: On-going Pain Intervention(s): Medication (See eMAR) ADL ADL Eating: Not assessed Where Assessed-Eating: Wheelchair Grooming: Modified independent Where Assessed-Grooming: Sitting at sink Upper Body Bathing: Independent Where Assessed-Upper Body Bathing: Sitting at sink, Wheelchair Lower Body Bathing: Independent Where Assessed-Lower Body Bathing: Sitting at sink, Wheelchair Upper Body Dressing: Independent Where Assessed-Upper Body Dressing: Wheelchair Lower Body Dressing: Independent Where Assessed-Lower Body Dressing: Bed level Toileting: Modified independent Where Assessed-Toileting: Risk analyst Method: Engineer, water: Extra wide drop arm bedside commode Tub/Shower Transfer: Supervision (using adaptive sit-lowering method) Vision Baseline Vision/History: Wears glasses Wears Glasses: Reading only Patient Visual Report: No change from baseline Vision Assessment?: No apparent  visual deficits Perception  Perception: Within Functional Limits Praxis Praxis: Intact Cognition Orientation Level: Oriented X4 Memory: Appears intact Awareness: Appears intact Problem Solving:  Appears intact Safety/Judgment: Appears intact Sensation Sensation Light Touch: Appears Intact(Slight tingling in digits in both hands, however normal at baseline) Proprioception: Appears Intact Coordination Gross Motor Movements are Fluid and Coordinated: Yes Fine Motor Movements are Fluid and Coordinated: Yes Motor  Motor Motor: Within Functional Limits Motor - Discharge Observations: General weakness and fatigue, especially after HD Mobility    Squat pivot toilet transfers< Modified Independence  Balance Balance Balance Assessed: Yes Dynamic Sitting Balance Dynamic Sitting - Level of Assistance: 6: Modified independent (Device/Increase time)(donning prosthetic EOB and perihygiene completion using lateral lean method) Extremity/Trunk Assessment RUE Assessment RUE Assessment: Within Functional Limits LUE Assessment LUE Assessment: Within Functional Limits   Kamarri Lovvorn A Waynetta Metheny 11/08/2018, 3:22 PM

## 2018-11-08 NOTE — Progress Notes (Signed)
Physical Therapy Discharge Summary  Patient Details  Name: [name] MRN: [id number] Date of Birth: 11-Sep-1961  Today's Date: 11/08/2018 PT Individual Time: 0900-1000 PT Individual Time Calculation (min): 60 min    Patient has met 9 of 9 long term goals due to improved activity tolerance, improved balance, improved postural control, increased strength, decreased pain and ability to compensate for deficits.  Patient to discharge at a wheelchair level Modified Independent. Pt has made good progress during his short rehab stay and is at mod I level for all transfers, w/c mobility, and short distance gait with RW so that he can safely navigate in his home environment.   Reasons goals not met: Pt has met all rehab goals.  Recommendation:  Patient will benefit from ongoing skilled PT services in home health setting to continue to advance safe functional mobility, address ongoing impairments in endurance, strength, comfort level with R residual limb care, and minimize fall risk.  Equipment: No equipment provided. Pt already owns a RW and manual w/c and does not qualify for coverage for new equipment at this time.  Reasons for discharge: treatment goals met and discharge from hospital  Patient/family agrees with progress made and goals achieved: Yes   Skilled Intervention: Pt received supine in bed asleep, arousable but very fatigued throughout therapy session from dialysis session yesterday. See pain details below. Pt performs bed mobility independently. Squat pivot transfer w/c to/from various height surfaces throughout session at Mod I level. Pt demos good safety and management of w/c parts with transfers. Car transfer mod I squat pivot to sedan height. Reviewed HEP: supine, sidelying, and prone R BKA therex. Pt demos good understanding and performance of HEP, handout provided. Pt left seated in bed with needs in reach.  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), NEW R BKA Restrictions Weight Bearing Restrictions: Yes RLE Weight Bearing: Non weight bearing Vital Signs Therapy Vitals Temp: 98.2 F (36.8 C) Temp Source: Oral Pulse Rate: 69 Resp: 18 BP: 113/72 Patient Position (if appropriate): Lying Oxygen Therapy SpO2: 100 % O2 Device: Room Air Pain Pain Assessment Pain Scale: 0-10 Pain Score: 6  Pain Type: Surgical pain Pain Location: Incision Pain Orientation: Right Pain Descriptors / Indicators: Aching;Discomfort;Throbbing Pain Frequency: Constant Pain Onset: On-going Pain Intervention(s): Medication (See eMAR) Vision/Perception  Perception Perception: Within Functional Limits Praxis Praxis: Intact  Cognition Overall Cognitive Status: Within Functional Limits for tasks assessed Arousal/Alertness: Awake/alert Orientation Level: Oriented X4 Attention: Alternating Alternating Attention: Appears intact Memory: Appears intact Awareness: Appears intact Problem Solving: Appears intact Safety/Judgment: Appears intact Sensation Sensation Light Touch: Appears Intact Proprioception: Appears Intact Coordination Gross Motor Movements are Fluid and Coordinated: Yes Fine Motor Movements are Fluid and Coordinated: Yes Coordination and Movement Description: improved since eval Motor  Motor Motor: Within Functional Limits Motor - Discharge Observations: improved since eval  Mobility Bed Mobility Bed Mobility: Rolling Right;Rolling Left;Supine to Sit;Sit to Supine Rolling Right: Independent Rolling Left: Independent Right Sidelying to Sit: Independent Supine to Sit: Independent Sit to Supine: Independent Transfers Transfers: Sit to [employer] Transfers Sit to Stand: Independent with assistive device Stand to Sit: Independent with assistive device Stand Pivot Transfers: Independent with assistive device Lateral/Scoot Transfers: Independent with assistive  device Transfer (Assistive device): Rolling walker Locomotion  Gait Ambulation: Yes Gait Assistance: Independent with assistive device Gait Distance (Feet): 25 Feet Assistive device: Rolling walker Gait Gait: Yes Gait Pattern: Impaired(ambulating with L BKA prosthesis and RW only) Stairs /  Additional Locomotion Stairs: No Architect: Yes Wheelchair Assistance: Independent with Camera operator: Both upper extremities Wheelchair Parts Management: Independent Distance: 150'  Trunk/Postural Assessment  Cervical Assessment Cervical Assessment: Within Functional Limits Thoracic Assessment Thoracic Assessment: Within Functional Limits Lumbar Assessment Lumbar Assessment: Within Functional Limits Postural Control Postural Control: Within Functional Limits  Balance Balance Balance Assessed: Yes Static Sitting Balance Static Sitting - Level of Assistance: 7: Independent Dynamic Sitting Balance Dynamic Sitting - Level of Assistance: 7: Independent Static Standing Balance Static Standing - Level of Assistance: 6: Modified independent (Device/Increase time) Dynamic Standing Balance Dynamic Standing - Level of Assistance: 6: Modified independent (Device/Increase time) Extremity Assessment  RUE Assessment RUE Assessment: Within Functional Limits LUE Assessment LUE Assessment: Within Functional Limits RLE Assessment RLE Assessment: Within Functional Limits Passive Range of Motion (PROM) Comments: hip and knee WFL, BKA General Strength Comments: 4/5 hip flex, knee flex/ext [name] Assessment [name] Assessment: Within Functional Limits Passive Range of Motion (PROM) Comments: hip and knee WFL, L BKA General Strength Comments: hip and knee WFL, L BKA    Excell Seltzer, PT, DPT  11/08/2018, 3:43 PM

## 2018-11-08 NOTE — Progress Notes (Signed)
Orthopedic Tech Progress Note Patient Details:  [name] 23-Nov-1961 [id number]  Patient ID: [name], male   DOB: Apr 17, 1961, 57 y.o.   MRN: [id number]   [name] 11/08/2018, 12:41 PMCalled [employer] for right BKA stump shrinker.

## 2018-11-08 NOTE — Discharge Summary (Signed)
Physician Discharge Summary  Patient ID: [name] MRN: [id number] DOB/AGE: 1961/05/04 57 y.o.  Admit date: 11/01/2018 Discharge date: 11/09/2018  Discharge Diagnoses:  Principal Problem:   Unilateral complete BKA, right, initial encounter [facility]) Active Problems:   Benign essential HTN   Acute blood loss anemia   ESRD (end stage renal disease) ([place])   Diabetes mellitus type 2 in nonobese [facility])   Discharged Condition: stable   Significant Diagnostic Studies: N/A   Labs:  Basic Metabolic Panel: BMP Latest Ref Rng & Units 11/09/2018 11/07/2018 11/05/2018  Glucose 70 - 99 mg/dL 190(H) 171(H) 169(H)  BUN 6 - 20 mg/dL 76(H) 92(H) 130(H)  Creatinine 0.61 - 1.24 mg/dL 8.10(H) 8.53(H) 9.33(H)  Sodium 135 - 145 mmol/L 133(L) 134(L) 134(L)  Potassium 3.5 - 5.1 mmol/L 3.5 4.0 4.5  Chloride 98 - 111 mmol/L 91(L) 95(L) 99  CO2 22 - 32 mmol/L 25 21(L) 18(L)  Calcium 8.9 - 10.3 mg/dL 8.6(L) 8.8(L) 8.7(L)    CBC: CBC Latest Ref Rng & Units 11/09/2018 11/07/2018 11/05/2018  WBC 4.0 - 10.5 K/uL 11.6(H) 13.4(H) 13.1(H)  Hemoglobin 13.0 - 17.0 g/dL 8.7(L) 8.7(L) 7.9(L)  Hematocrit 39.0 - 52.0 % 30.5(L) 30.5(L) 27.5(L)  Platelets 150 - 400 K/uL 327 347 329    CBG: Recent Labs  Lab 11/08/18 2140 11/09/18 0618 11/09/18 0633 11/09/18 1315 11/09/18 1641  GLUCAP 138* 165* 171* 141* 159*    Brief HPI:   [name] is a 57 year old male with history of HTN, PVD, T2 DM with peripheral neuropathy, ESRD-HD TTS, left BKA, PVD, right metatarsal foot amputation with recent admission for osteomyelitis due to MRSA infection and was admitted from hemodialysis center with hypotension.  He was treated with fluid bolus as well as IV antibiotics. Attempts at limb salvage unsucessful and he underwent right BKA by Dr. Doran Durand on 11/23.  Clotted tunnel dialysis catheter removed and rep;aced by Dr. Trula Slade on 11/27. Postop therapy evaluations revealed functional decline and CIR was recommended for  follow-up therapy   Hospital Course: [name] was admitted to rehab 11/01/2018 for inpatient therapies to consist of PT and OT at least three hours five days a week. Past admission physiatrist, therapy team and rehab RN have worked together to provide customized collaborative inpatient rehab.  He was maintained on subcu heparin for DVT prophylaxis.  Pro-stat was added to help promote wound healing.  Right BKA site is healing well without any signs or symptoms of infection.  Blood pressures have been monitored on twice daily basis and have been stable on Coreg.  P.o. intake has been good and blood sugars have been monitored with ac/hs cbg checks.  Lantus was titrated upwards to 18 units twice daily with improvement in blood sugar control.    Hemodialysis was scheduled at the end of the day to help with tolerance of activity.  Routine labs done with hemodialysis showed improvement in acute on chronic anemia and reactive leukocytosis is resolving.  He has required hydrocodone as well as tramadol for adequate pain control.  He was educated on addiction risk as well as importance of tapering narcotics past he has made good progress during his rehab stay and is modified independent at wheelchair level.  He will continue to receive further follow-up home health PT and RN by [facility] home health at discharge.    Rehab course: During patient's stay in rehab team conference was held to monitor patient's progress, set goals and discuss barriers to discharge. At admission, patient  required min assist with mobility and self-care tasks. He  has had improvement in activity tolerance, balance, postural control as well as ability to compensate for deficits.  He is able to perform squat pivot transfers from bearing surfaces at modified independent level.  Is modified independent for car transfers and is able to ambulate 25 feet with rolling walker.   Disposition: Home  Diet: Renal diet with diabetic  restrictions  Special Instructions: 1.  Wash incision with soap and water.  Pat dry and apply dry compressive dressing. 2.  Continue to monitor blood sugars before meals at bedtime.  Follow-up with primary MD for further titration of insulin Discharge Instructions    Ambulatory referral to Physical Medicine Rehab   Complete by:  As directed    1-2 weeks transitional care appt     Allergies as of 11/09/2018   No Known Allergies     Medication List    STOP taking these medications   amLODipine 10 MG tablet Commonly known as:  NORVASC   insulin aspart 100 UNIT/ML injection Commonly known as:  novoLOG   oxyCODONE 5 MG immediate release tablet Commonly known as:  ROXICODONE     TAKE these medications   acetaminophen 325 MG tablet Commonly known as:  TYLENOL Take 1-2 tablets (325-650 mg total) by mouth every 4 (four) hours as needed for mild pain.   aspirin 81 MG EC tablet Take 1 tablet (81 mg total) by mouth daily.   atorvastatin 10 MG tablet Commonly known as:  LIPITOR Take 1 tablet (10 mg total) by mouth at bedtime.   calcitRIOL 0.5 MCG capsule Commonly known as:  ROCALTROL Take 1 capsule (0.5 mcg total) by mouth Every Tuesday,Thursday,and Saturday with dialysis.   carvedilol 12.5 MG tablet Commonly known as:  COREG Take 1 tablet (12.5 mg total) by mouth 2 (two) times daily with a meal.   clopidogrel 75 MG tablet Commonly known as:  PLAVIX Take 1 tablet (75 mg total) by mouth daily.   docusate sodium 100 MG capsule Commonly known as:  COLACE Take 1 capsule (100 mg total) by mouth 2 (two) times daily.   feeding supplement (NEPRO CARB STEADY) Liqd Take 237 mLs by mouth 2 (two) times daily between meals.   gabapentin 300 MG capsule Commonly known as:  NEURONTIN Take 1 capsule (300 mg total) by mouth at bedtime.   HYDROcodone-acetaminophen 5-325 MG tablet--Rx # 50 pills Commonly known as:  NORCO/VICODIN Take 1-2 tablets by mouth every 6 (six) hours as needed  for severe pain.   insulin glargine 100 UNIT/ML injection Commonly known as:  LANTUS Inject 0.18 mLs (18 Units total) into the skin 2 (two) times daily. What changed:  how much to take   multivitamin Tabs tablet Take 1 tablet by mouth at bedtime.   pantoprazole 40 MG tablet Commonly known as:  PROTONIX Take 1 tablet (40 mg total) by mouth daily.   polyethylene glycol packet Commonly known as:  MIRALAX / GLYCOLAX Take 17 g by mouth daily.   sevelamer carbonate 800 MG tablet Commonly known as:  RENVELA Take 3 tablets (2,400 mg total) by mouth 3 (three) times daily with meals. What changed:  how much to take   traMADol 50 MG tablet--Rx# 28 pills  Commonly known as:  ULTRAM Take 1 tablet (50 mg total) by mouth every 6 (six) hours as needed for moderate pain.      Follow-up Information    Meredith Staggers, MD Follow up.   Specialty:  Physical Medicine and Rehabilitation Contact information: [address] [place] [address] [phone]        [name], MD. Call in 3 day(s).   Specialty:  Orthopedic Surgery Why:  for follow up appointment Contact information: [address] [place] [address] [phone]        [name], FNP Follow up on 11/20/2018.   Specialty:  Family Medicine Why:  10:15 am (hospital follow up appt) Contact information: [address] [place] [address] [phone]           Signed: Bary Leriche 11/14/2018, 2:51 PM

## 2018-11-08 NOTE — Progress Notes (Signed)
Patient ID: [name], male   DOB: [birth year], 59 y.o.   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  Appears to have progressed well with inpatient rehabilitation and plans noted for discharge home tomorrow. 2. ESRD: Continue HD per TTS schedule,next hemodialysis early tomorrow to allow for discharge thereafter. 3. Anemia: Status post earlier PRBC transfusion with relatively stable hemoglobin/hematocrit, continue ESA 4. Secondary hyperparathyroidism: Continue VDRF for PTH suppression along with Renvela for phosphorus binding-phosphorus and calcium levels currently at goal. 5.HTN/volume: Blood pressure currently appears to be well controlled and he is euvolemic on physical exam.  We will exercise cautious ultrafiltration given events following last dialysis. 6. Nutrition: Low albumin secondary to acute phase reaction following infection/surgery-continue ONS/renal diet  Subjective:   Reports to be doing well and looking forward to going home tomorrow   Objective:   BP 130/66 (BP Location: Right Arm)   Pulse 69   Temp 98.3 F (36.8 C) (Oral)   Resp 17   Ht 6' (1.829 m) Comment: prior to amputation  Wt 91.2 kg   SpO2 99%   BMI 27.27 kg/m   Physical Exam: Gen: Comfortably resting in bed, just back from therapy CVS: Pulse regular rhythm, normal rate, S1 and S2 normal Resp: Clear to auscultation, no rales/rhonchi Abd: Soft, flat, nontender Ext: Status post bilateral below-knee amputations, clean/dry dressing.  Labs: BMET Recent Labs  Lab 11/02/18 1329 11/05/18 1435 11/07/18 1230  NA 137 134* 134*  K 4.8 4.5 4.0  CL 102 99 95*  CO2 19* 18* 21*  GLUCOSE 106* 169* 171*  BUN 112* 130* 92*  CREATININE 8.34* 9.33* 8.53*  CALCIUM 8.5* 8.7* 8.8*  PHOS 6.6* 7.4* 6.9*   CBC Recent Labs  Lab 11/02/18 1328 11/05/18 1436  11/07/18 1230  WBC 11.3* 13.1* 13.4*  HGB 7.7* 7.9* 8.7*  HCT 27.1* 27.5* 30.5*  MCV 79.7* 80.4 79.8*  PLT 331 329 347   Medications:    . aspirin EC  81 mg Oral Daily  . atorvastatin  10 mg Oral QHS  . calcitRIOL  0.5 mcg Oral Q T,Th,Sa-HD  . carvedilol  12.5 mg Oral BID WC  . Chlorhexidine Gluconate Cloth  6 each Topical Q0600  . clopidogrel  75 mg Oral Daily  . darbepoetin (ARANESP) injection - DIALYSIS  200 mcg Intravenous Q Sat-HD  . docusate sodium  100 mg Oral BID  . feeding supplement (NEPRO CARB STEADY)  237 mL Oral BID WC  . feeding supplement (PRO-STAT SUGAR FREE 64)  30 mL Oral BID  . gabapentin  300 mg Oral QHS  . heparin  4,200 Units Intracatheter Q T,Th,Sa-HD  . heparin  5,000 Units Subcutaneous Q8H  . insulin aspart  0-9 Units Subcutaneous TID WC  . insulin aspart  3 Units Subcutaneous TID WC  . insulin glargine  18 Units Subcutaneous BID  . multivitamin  1 tablet Oral QHS  . pantoprazole  40 mg Oral Daily  . sevelamer carbonate  2,400 mg Oral TID WC   [name], MD 11/08/2018, 10:48 AM

## 2018-11-09 LAB — CBC
HEMATOCRIT: 30.5 % — AB (ref 39.0–52.0)
Hemoglobin: 8.7 g/dL — ABNORMAL LOW (ref 13.0–17.0)
MCH: 22.9 pg — ABNORMAL LOW (ref 26.0–34.0)
MCHC: 28.5 g/dL — ABNORMAL LOW (ref 30.0–36.0)
MCV: 80.3 fL (ref 80.0–100.0)
NRBC: 0 % (ref 0.0–0.2)
Platelets: 327 10*3/uL (ref 150–400)
RBC: 3.8 MIL/uL — ABNORMAL LOW (ref 4.22–5.81)
RDW: 18.7 % — AB (ref 11.5–15.5)
WBC: 11.6 10*3/uL — ABNORMAL HIGH (ref 4.0–10.5)

## 2018-11-09 LAB — RENAL FUNCTION PANEL
Albumin: 2.6 g/dL — ABNORMAL LOW (ref 3.5–5.0)
Anion gap: 17 — ABNORMAL HIGH (ref 5–15)
BUN: 76 mg/dL — ABNORMAL HIGH (ref 6–20)
CHLORIDE: 91 mmol/L — AB (ref 98–111)
CO2: 25 mmol/L (ref 22–32)
Calcium: 8.6 mg/dL — ABNORMAL LOW (ref 8.9–10.3)
Creatinine, Ser: 8.1 mg/dL — ABNORMAL HIGH (ref 0.61–1.24)
GFR, EST AFRICAN AMERICAN: 8 mL/min — AB (ref >60)
GFR, EST NON AFRICAN AMERICAN: 7 mL/min — AB (ref >60)
Glucose, Bld: 190 mg/dL — ABNORMAL HIGH (ref 70–99)
Phosphorus: 7.3 mg/dL — ABNORMAL HIGH (ref 2.5–4.6)
Potassium: 3.5 mmol/L (ref 3.5–5.1)
Sodium: 133 mmol/L — ABNORMAL LOW (ref 135–145)

## 2018-11-09 LAB — GLUCOSE, CAPILLARY
Glucose-Capillary: 165 mg/dL — ABNORMAL HIGH (ref 70–99)
Glucose-Capillary: 171 mg/dL — ABNORMAL HIGH (ref 70–99)
Glucose-Capillary: 141 mg/dL — ABNORMAL HIGH (ref 70–99)
Glucose-Capillary: 159 mg/dL — ABNORMAL HIGH (ref 70–99)

## 2018-11-09 MED ORDER — DARBEPOETIN ALFA 200 MCG/0.4ML IJ SOSY
PREFILLED_SYRINGE | INTRAMUSCULAR | Status: AC
  Administered 2018-11-09: 200 ug via INTRAVENOUS
  Filled 2018-11-09: qty 0.4

## 2018-11-09 MED ORDER — HEPARIN SODIUM (PORCINE) 1000 UNIT/ML DIALYSIS
2800.0000 [IU] | INTRAMUSCULAR | Status: DC | PRN
  Administered 2018-11-09: 2800 [IU] via INTRAVENOUS_CENTRAL
  Filled 2018-11-09: qty 2.8

## 2018-11-09 MED ORDER — HEPARIN SODIUM (PORCINE) 1000 UNIT/ML IJ SOLN
INTRAMUSCULAR | Status: AC
  Administered 2018-11-09: 4200 [IU]
  Filled 2018-11-09: qty 5

## 2018-11-09 MED ORDER — HEPARIN SODIUM (PORCINE) 1000 UNIT/ML IJ SOLN
INTRAMUSCULAR | Status: AC
  Administered 2018-11-09: 2800 [IU] via INTRAVENOUS_CENTRAL
  Filled 2018-11-09: qty 3

## 2018-11-09 NOTE — Procedures (Signed)
Patient seen on Hemodialysis. BP 136/67 (BP Location: Right Arm)   Pulse 67   Temp 98.4 F (36.9 C) (Oral)   Resp 18   Ht 6' (1.829 m) Comment: prior to amputation  Wt 91.6 kg   SpO2 100%   BMI 27.39 kg/m   QB 250 (problems with decreasing arterial pressures on TDC noted), UF goal 2.5L Tolerating treatment without complaints at this time.  Anticipated DC home today from CIR.  [name] MD [facility] Office # [phone] Pager # [phone] 9:23 AM

## 2018-11-09 NOTE — Plan of Care (Signed)
Pt to d/c home after dialysis, d/c instructions provided to pt by P. Love, PA-c. Pt verbalized an understanding of d/c instructions, medications, and follow up appts.

## 2018-12-03 ENCOUNTER — Encounter: Payer: Medicare Other | Attending: Registered Nurse | Admitting: Registered Nurse

## 2019-02-19 DIAGNOSIS — R52 Pain, unspecified: Secondary | ICD-10-CM | POA: Insufficient documentation

## 2019-04-29 ENCOUNTER — Encounter: Payer: Self-pay | Admitting: Physical Therapy

## 2019-04-29 ENCOUNTER — Other Ambulatory Visit: Payer: Self-pay

## 2019-04-29 ENCOUNTER — Ambulatory Visit: Payer: Medicare Other | Attending: Orthopedic Surgery | Admitting: Physical Therapy

## 2019-04-29 DIAGNOSIS — M6281 Muscle weakness (generalized): Secondary | ICD-10-CM | POA: Diagnosis present

## 2019-04-29 DIAGNOSIS — M79662 Pain in left lower leg: Secondary | ICD-10-CM | POA: Diagnosis present

## 2019-04-29 DIAGNOSIS — R2681 Unsteadiness on feet: Secondary | ICD-10-CM | POA: Diagnosis present

## 2019-04-29 DIAGNOSIS — R2689 Other abnormalities of gait and mobility: Secondary | ICD-10-CM | POA: Diagnosis present

## 2019-04-29 DIAGNOSIS — M79661 Pain in right lower leg: Secondary | ICD-10-CM | POA: Diagnosis present
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
## 2019-05-13 ENCOUNTER — Ambulatory Visit: Payer: Medicare Other | Attending: Orthopedic Surgery | Admitting: Physical Therapy

## 2019-05-13 DIAGNOSIS — R2689 Other abnormalities of gait and mobility: Secondary | ICD-10-CM | POA: Insufficient documentation

## 2019-05-13 DIAGNOSIS — M6281 Muscle weakness (generalized): Secondary | ICD-10-CM | POA: Insufficient documentation

## 2019-05-13 DIAGNOSIS — M79662 Pain in left lower leg: Secondary | ICD-10-CM | POA: Insufficient documentation

## 2019-05-13 DIAGNOSIS — M79661 Pain in right lower leg: Secondary | ICD-10-CM | POA: Insufficient documentation

## 2019-05-13 DIAGNOSIS — R2681 Unsteadiness on feet: Secondary | ICD-10-CM | POA: Insufficient documentation

## 2019-05-20 ENCOUNTER — Encounter: Payer: Self-pay | Admitting: Physical Therapy

## 2019-05-20 ENCOUNTER — Ambulatory Visit: Payer: Medicare Other | Admitting: Physical Therapy

## 2019-05-20 ENCOUNTER — Other Ambulatory Visit: Payer: Self-pay

## 2019-05-20 DIAGNOSIS — M6281 Muscle weakness (generalized): Secondary | ICD-10-CM

## 2019-05-20 DIAGNOSIS — M79661 Pain in right lower leg: Secondary | ICD-10-CM | POA: Diagnosis present

## 2019-05-20 DIAGNOSIS — R2689 Other abnormalities of gait and mobility: Secondary | ICD-10-CM | POA: Diagnosis present

## 2019-05-20 DIAGNOSIS — R2681 Unsteadiness on feet: Secondary | ICD-10-CM | POA: Diagnosis not present

## 2019-05-20 DIAGNOSIS — M79662 Pain in left lower leg: Secondary | ICD-10-CM
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
## 2019-05-20 NOTE — Patient Instructions (Signed)
Access Code: [email or web address]  URL: [email or web address]  Date: 05/20/2019  Prepared by: [name]   Exercises Seated Hamstring Stretch - 2-3 reps - 1 sets - 20-30 seconds hold                            - 1x daily - 7x weekly Gastroc Stretch on Step - 2-3 reps - 1 sets - 20-30 seconds hold - 1x daily - 7x weekly

## 2019-05-30 ENCOUNTER — Ambulatory Visit: Payer: Medicare Other | Admitting: Physical Therapy

## 2019-05-30 ENCOUNTER — Encounter: Payer: Self-pay | Admitting: Physical Therapy

## 2019-05-30 ENCOUNTER — Other Ambulatory Visit: Payer: Self-pay

## 2019-05-30 DIAGNOSIS — R2681 Unsteadiness on feet: Secondary | ICD-10-CM

## 2019-05-30 DIAGNOSIS — M6281 Muscle weakness (generalized): Secondary | ICD-10-CM

## 2019-05-30 DIAGNOSIS — R2689 Other abnormalities of gait and mobility: Secondary | ICD-10-CM
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
## 2019-05-30 NOTE — Patient Instructions (Signed)

## 2019-06-03 ENCOUNTER — Other Ambulatory Visit: Payer: Self-pay

## 2019-06-03 ENCOUNTER — Ambulatory Visit: Payer: Medicare Other | Admitting: Physical Therapy

## 2019-06-03 ENCOUNTER — Encounter: Payer: Self-pay | Admitting: Physical Therapy

## 2019-06-03 DIAGNOSIS — R2681 Unsteadiness on feet: Secondary | ICD-10-CM

## 2019-06-03 DIAGNOSIS — M6281 Muscle weakness (generalized): Secondary | ICD-10-CM

## 2019-06-03 DIAGNOSIS — M79662 Pain in left lower leg: Secondary | ICD-10-CM

## 2019-06-03 DIAGNOSIS — R2689 Other abnormalities of gait and mobility: Secondary | ICD-10-CM

## 2019-06-03 DIAGNOSIS — M79661 Pain in right lower leg: Secondary | ICD-10-CM
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
## 2019-06-10 ENCOUNTER — Ambulatory Visit: Payer: Medicare Other | Admitting: Physical Therapy

## 2019-06-13 ENCOUNTER — Encounter: Payer: Self-pay | Admitting: Physical Therapy

## 2019-06-13 ENCOUNTER — Other Ambulatory Visit: Payer: Self-pay

## 2019-06-13 ENCOUNTER — Ambulatory Visit: Payer: Medicare Other | Attending: Orthopedic Surgery | Admitting: Physical Therapy

## 2019-06-13 DIAGNOSIS — M6281 Muscle weakness (generalized): Secondary | ICD-10-CM

## 2019-06-13 DIAGNOSIS — R2681 Unsteadiness on feet: Secondary | ICD-10-CM | POA: Diagnosis not present

## 2019-06-13 DIAGNOSIS — R2689 Other abnormalities of gait and mobility: Secondary | ICD-10-CM | POA: Diagnosis present

## 2019-06-13 DIAGNOSIS — M79662 Pain in left lower leg: Secondary | ICD-10-CM | POA: Insufficient documentation

## 2019-06-13 DIAGNOSIS — Z9181 History of falling: Secondary | ICD-10-CM | POA: Insufficient documentation

## 2019-06-13 DIAGNOSIS — M79661 Pain in right lower leg: Secondary | ICD-10-CM | POA: Insufficient documentation
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
## 2019-06-17 ENCOUNTER — Encounter: Payer: Self-pay | Admitting: Physical Therapy

## 2019-06-17 ENCOUNTER — Other Ambulatory Visit: Payer: Self-pay

## 2019-06-17 ENCOUNTER — Ambulatory Visit: Payer: Medicare Other | Admitting: Physical Therapy

## 2019-06-17 DIAGNOSIS — M79661 Pain in right lower leg: Secondary | ICD-10-CM

## 2019-06-17 DIAGNOSIS — M79662 Pain in left lower leg: Secondary | ICD-10-CM

## 2019-06-17 DIAGNOSIS — R2681 Unsteadiness on feet: Secondary | ICD-10-CM

## 2019-06-17 DIAGNOSIS — M6281 Muscle weakness (generalized): Secondary | ICD-10-CM

## 2019-06-17 DIAGNOSIS — R2689 Other abnormalities of gait and mobility: Secondary | ICD-10-CM

## 2019-06-17 DIAGNOSIS — Z9181 History of falling: Secondary | ICD-10-CM
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
## 2019-06-20 ENCOUNTER — Other Ambulatory Visit: Payer: Self-pay

## 2019-06-20 ENCOUNTER — Encounter: Payer: Self-pay | Admitting: Physical Therapy

## 2019-06-20 ENCOUNTER — Ambulatory Visit: Payer: Medicare Other | Admitting: Physical Therapy

## 2019-06-20 DIAGNOSIS — R2681 Unsteadiness on feet: Secondary | ICD-10-CM

## 2019-06-20 DIAGNOSIS — R2689 Other abnormalities of gait and mobility: Secondary | ICD-10-CM

## 2019-06-20 DIAGNOSIS — M6281 Muscle weakness (generalized): Secondary | ICD-10-CM
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
## 2019-06-23 ENCOUNTER — Telehealth: Payer: Self-pay | Admitting: Physical Therapy

## 2019-06-23 NOTE — Telephone Encounter (Signed)
[name] needs a pair of tall adult forearm crutches for gait with bilateral BKA prostheses. He requires forearm crutches due to dialysis fistula in upper arm prohibits use of axillary crutches. Can you please place an order in Epic for Tall Adult Forearm Crutches? Please contact me with any questions [name], PT, DPT PT Specializing in [place] 06/23/2019@ 7:11 PM Phone:  [phone]  Fax:  [phone] [facility] [address] [place], [place] [address]

## 2019-06-24 ENCOUNTER — Ambulatory Visit: Payer: Medicare Other | Admitting: Physical Therapy

## 2019-06-24 ENCOUNTER — Other Ambulatory Visit: Payer: Self-pay

## 2019-06-24 ENCOUNTER — Encounter: Payer: Self-pay | Admitting: Physical Therapy

## 2019-06-24 DIAGNOSIS — M6281 Muscle weakness (generalized): Secondary | ICD-10-CM

## 2019-06-24 DIAGNOSIS — R2689 Other abnormalities of gait and mobility: Secondary | ICD-10-CM

## 2019-06-24 DIAGNOSIS — M79662 Pain in left lower leg: Secondary | ICD-10-CM

## 2019-06-24 DIAGNOSIS — M79661 Pain in right lower leg: Secondary | ICD-10-CM

## 2019-06-24 DIAGNOSIS — R2681 Unsteadiness on feet: Secondary | ICD-10-CM
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
## 2019-06-26 ENCOUNTER — Ambulatory Visit: Payer: Medicare Other | Admitting: Physical Therapy

## 2019-07-01 ENCOUNTER — Other Ambulatory Visit: Payer: Self-pay

## 2019-07-01 ENCOUNTER — Encounter: Payer: Self-pay | Admitting: Physical Therapy

## 2019-07-01 ENCOUNTER — Ambulatory Visit: Payer: Medicare Other | Admitting: Physical Therapy

## 2019-07-01 DIAGNOSIS — R2689 Other abnormalities of gait and mobility: Secondary | ICD-10-CM

## 2019-07-01 DIAGNOSIS — M79661 Pain in right lower leg: Secondary | ICD-10-CM

## 2019-07-01 DIAGNOSIS — R2681 Unsteadiness on feet: Secondary | ICD-10-CM | POA: Diagnosis not present

## 2019-07-01 DIAGNOSIS — M6281 Muscle weakness (generalized): Secondary | ICD-10-CM

## 2019-07-01 DIAGNOSIS — M79662 Pain in left lower leg: Secondary | ICD-10-CM

## 2019-07-01 DIAGNOSIS — Z9181 History of falling: Secondary | ICD-10-CM
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
## 2019-07-08 ENCOUNTER — Ambulatory Visit: Payer: Medicare Other | Attending: Orthopedic Surgery | Admitting: Physical Therapy

## 2019-07-08 ENCOUNTER — Encounter: Payer: Self-pay | Admitting: Physical Therapy

## 2019-07-08 ENCOUNTER — Other Ambulatory Visit: Payer: Self-pay

## 2019-07-08 DIAGNOSIS — R2689 Other abnormalities of gait and mobility: Secondary | ICD-10-CM | POA: Diagnosis present

## 2019-07-08 DIAGNOSIS — M79661 Pain in right lower leg: Secondary | ICD-10-CM | POA: Insufficient documentation

## 2019-07-08 DIAGNOSIS — R2681 Unsteadiness on feet: Secondary | ICD-10-CM | POA: Insufficient documentation

## 2019-07-08 DIAGNOSIS — M6281 Muscle weakness (generalized): Secondary | ICD-10-CM | POA: Diagnosis present

## 2019-07-08 DIAGNOSIS — Z9181 History of falling: Secondary | ICD-10-CM | POA: Diagnosis present

## 2019-07-08 DIAGNOSIS — M79662 Pain in left lower leg: Secondary | ICD-10-CM

## 2019-07-09 ENCOUNTER — Telehealth: Payer: Self-pay | Admitting: Physical Therapy

## 2019-07-09 DIAGNOSIS — T782XXA Anaphylactic shock, unspecified, initial encounter: Secondary | ICD-10-CM | POA: Insufficient documentation

## 2019-07-09 DIAGNOSIS — T7840XA Allergy, unspecified, initial encounter: Secondary | ICD-10-CM | POA: Insufficient documentation

## 2019-07-09 NOTE — Telephone Encounter (Signed)
Dr. Lavonia Drafts,  We are seeing [name] in our clinic for gait training with bil prostheses. He needs a pair of tall adult forearm crutches for gait with bilateral BKA prostheses. He requires forearm crutches due to dialysis fistula in upper arm prohibits use of axillary crutches. Can you please place an order in Epic for Tall Adult Forearm Crutches? Or fax a paper one to our clinic at [phone].  Please contact me with any questions  Willow Ora, PTA, [facility] [address] [place], [place] [address] [phone] 07/09/19, 9:20 AM
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
## 2019-07-10 ENCOUNTER — Encounter: Payer: Self-pay | Admitting: Physical Therapy

## 2019-07-10 ENCOUNTER — Other Ambulatory Visit: Payer: Self-pay

## 2019-07-10 ENCOUNTER — Ambulatory Visit: Payer: Medicare Other | Admitting: Physical Therapy

## 2019-07-10 DIAGNOSIS — M6281 Muscle weakness (generalized): Secondary | ICD-10-CM

## 2019-07-10 DIAGNOSIS — R2689 Other abnormalities of gait and mobility: Secondary | ICD-10-CM

## 2019-07-10 DIAGNOSIS — R2681 Unsteadiness on feet: Secondary | ICD-10-CM | POA: Diagnosis not present
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
## 2019-07-15 ENCOUNTER — Ambulatory Visit: Payer: Medicare Other | Admitting: Physical Therapy

## 2019-07-15 ENCOUNTER — Other Ambulatory Visit: Payer: Self-pay

## 2019-07-15 ENCOUNTER — Encounter: Payer: Self-pay | Admitting: Physical Therapy

## 2019-07-15 DIAGNOSIS — M79662 Pain in left lower leg: Secondary | ICD-10-CM

## 2019-07-15 DIAGNOSIS — R2681 Unsteadiness on feet: Secondary | ICD-10-CM

## 2019-07-15 DIAGNOSIS — R2689 Other abnormalities of gait and mobility: Secondary | ICD-10-CM

## 2019-07-15 DIAGNOSIS — M6281 Muscle weakness (generalized): Secondary | ICD-10-CM

## 2019-07-15 DIAGNOSIS — M79661 Pain in right lower leg: Secondary | ICD-10-CM
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
## 2019-07-17 ENCOUNTER — Ambulatory Visit: Payer: Medicare Other | Admitting: Physical Therapy

## 2019-07-17 ENCOUNTER — Other Ambulatory Visit: Payer: Self-pay

## 2019-07-17 ENCOUNTER — Encounter: Payer: Self-pay | Admitting: Physical Therapy

## 2019-07-17 DIAGNOSIS — R2681 Unsteadiness on feet: Secondary | ICD-10-CM

## 2019-07-17 DIAGNOSIS — M6281 Muscle weakness (generalized): Secondary | ICD-10-CM

## 2019-07-17 DIAGNOSIS — R2689 Other abnormalities of gait and mobility: Secondary | ICD-10-CM

## 2019-07-17 DIAGNOSIS — M79662 Pain in left lower leg: Secondary | ICD-10-CM

## 2019-07-17 DIAGNOSIS — M79661 Pain in right lower leg: Secondary | ICD-10-CM
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
## 2019-07-22 ENCOUNTER — Encounter: Payer: Self-pay | Admitting: Physical Therapy

## 2019-07-22 ENCOUNTER — Ambulatory Visit: Payer: Medicare Other | Admitting: Physical Therapy

## 2019-07-22 ENCOUNTER — Other Ambulatory Visit: Payer: Self-pay

## 2019-07-22 DIAGNOSIS — R2681 Unsteadiness on feet: Secondary | ICD-10-CM | POA: Diagnosis not present

## 2019-07-22 DIAGNOSIS — Z9181 History of falling: Secondary | ICD-10-CM

## 2019-07-22 DIAGNOSIS — R2689 Other abnormalities of gait and mobility: Secondary | ICD-10-CM

## 2019-07-22 DIAGNOSIS — M79661 Pain in right lower leg: Secondary | ICD-10-CM

## 2019-07-22 DIAGNOSIS — M79662 Pain in left lower leg: Secondary | ICD-10-CM

## 2019-07-22 DIAGNOSIS — M6281 Muscle weakness (generalized): Secondary | ICD-10-CM
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
## 2019-07-24 ENCOUNTER — Ambulatory Visit: Payer: Medicare Other | Admitting: Physical Therapy

## 2019-07-24 ENCOUNTER — Encounter: Payer: Self-pay | Admitting: Physical Therapy

## 2019-07-24 ENCOUNTER — Other Ambulatory Visit: Payer: Self-pay

## 2019-07-24 DIAGNOSIS — Z9181 History of falling: Secondary | ICD-10-CM

## 2019-07-24 DIAGNOSIS — M6281 Muscle weakness (generalized): Secondary | ICD-10-CM

## 2019-07-24 DIAGNOSIS — R2681 Unsteadiness on feet: Secondary | ICD-10-CM | POA: Diagnosis not present

## 2019-07-24 DIAGNOSIS — M79661 Pain in right lower leg: Secondary | ICD-10-CM

## 2019-07-24 DIAGNOSIS — M79662 Pain in left lower leg: Secondary | ICD-10-CM

## 2019-07-24 DIAGNOSIS — R2689 Other abnormalities of gait and mobility: Secondary | ICD-10-CM
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
## 2019-10-27 ENCOUNTER — Ambulatory Visit (INDEPENDENT_AMBULATORY_CARE_PROVIDER_SITE_OTHER): Payer: Medicare Other | Admitting: Family

## 2019-10-27 ENCOUNTER — Telehealth: Payer: Self-pay | Admitting: Pharmacy Technician

## 2019-10-27 ENCOUNTER — Other Ambulatory Visit: Payer: Self-pay

## 2019-10-27 ENCOUNTER — Encounter: Payer: Self-pay | Admitting: Family

## 2019-10-27 VITALS — BP 151/78 | HR 92 | Temp 97.6°F

## 2019-10-27 DIAGNOSIS — B182 Chronic viral hepatitis C: Secondary | ICD-10-CM | POA: Insufficient documentation
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
## 2019-10-27 NOTE — Telephone Encounter (Signed)
[facility] Patient Advocate Encounter   I gathered information in case copay assistance is needed for Hepatitis C medication.  [name]. [name] Patient [facility] for Infectious Disease Phone: [phone] Fax:  [phone]

## 2019-10-27 NOTE — Patient Instructions (Signed)
Nice to meet you.  We will check your blood work with dialysis.  Limit acetaminophen (Tylenol) usage to no more than 2 grams (2,000 mg) per day.  Avoid alcohol.  Do not share toothbrushes or razors.  Practice safe sex to protect against transmission as well as sexually transmitted disease.    Hepatitis C Hepatitis C is a viral infection of the liver. It can lead to scarring of the liver (cirrhosis), liver failure, or liver cancer. Hepatitis C may go undetected for months or years because people with the infection may not have symptoms, or they may have only mild symptoms. What are the causes? This condition is caused by the hepatitis C virus (HCV). The virus can spread from person to person (is contagious) through:  Blood.  Childbirth. A woman who has hepatitis C can pass it to her baby during birth.  Bodily fluids, such as breast milk, tears, semen, vaginal fluids, and saliva.  Blood transfusions or organ transplants done in the [place] before 1992.  What increases the risk? The following factors may make you more likely to develop this condition:  Having contact with unclean (contaminated) needles or syringes. This may result from: ? Acupuncture. ? Tattoing. ? Body piercing. ? Injecting drugs.  Having unprotected sex with someone who is infected.  Needing treatment to filter your blood (kidney dialysis).  Having HIV (human immunodeficiency virus) or AIDS (acquired immunodeficiency syndrome).  Working in a job that involves contact with blood or bodily fluids, such as health care.  What are the signs or symptoms? Symptoms of this condition include:  Fatigue.  Loss of appetite.  Nausea.  Vomiting.  Abdominal pain.  Dark yellow urine.  Yellowish skin and eyes (jaundice).  Itchy skin.  Clay-colored bowel movements.  Joint pain.  Bleeding and bruising easily.  Fluid building up in your stomach (ascites).  In some cases, you may not have any  symptoms. How is this diagnosed? This condition is diagnosed with:  Blood tests.  Other tests to check how well your liver is functioning. They may include: ? Magnetic resonance elastography (MRE). This imaging test uses MRIs and sound waves to measure liver stiffness. ? Transient elastography. This imaging test uses ultrasounds to measure liver stiffness. ? Liver biopsy. This test requires taking a small tissue sample from your liver to examine it under a microscope.  How is this treated? Your health care provider may perform noninvasive tests or a liver biopsy to help decide the best course of treatment. Treatment may include:  Antiviral medicines and other medicines.  Follow-up treatments every 6-12 months for infections or other liver conditions.  Receiving a donated liver (liver transplant).  Follow these instructions at home: Medicines  Take over-the-counter and prescription medicines only as told by your health care provider.  Take your antiviral medicine as told by your health care provider. Do not stop taking the antiviral even if you start to feel better.  Do not take any medicines unless approved by your health care provider, including over-the-counter medicines and birth control pills. Activity  Rest as needed.  Do not have sex unless approved by your health care provider.  Ask your health care provider when you may return to school or work. Eating and drinking  Eat a balanced diet with plenty of fruits and vegetables, whole grains, and lowfat (lean) meats or non-meat proteins (such as beans or tofu).  Drink enough fluids to keep your urine clear or pale yellow.  Do not drink alcohol. General  Keep all follow-up visits as told by your health care provider. This is important. You may need follow-up visits  every 6-12 months. How is this prevented? There is no vaccine for hepatitis C. The only way to prevent the disease is to reduce the risk of exposure to the virus. Make sure you: Wash your hands frequently with soap and water. If soap and water are not available, use hand sanitizer. Do not share needles or syringes. Practice safe sex and use condoms. Avoid handling blood or bodily fluids without gloves or other protection. Avoid getting tattoos or piercings in shops or other locations that are not clean.  Contact a health care provider if: You have a fever. You develop abdominal pain. You pass dark urine. You pass clay-colored stools. You develop joint pain. Get help right away if: You have increasing fatigue or weakness. You lose your appetite. You cannot eat or drink without vomiting. You develop jaundice or your jaundice gets worse. You bruise or bleed easily. Summary Hepatitis C is a viral infection of the liver. It can lead to scarring of the liver (cirrhosis), liver failure, or liver cancer. The hepatitis C virus (HCV) causes this condition. The virus can pass from person to person (is contagious). You should not take any medicines unless approved by your health care provider. This includes over-the-counter medicines and birth control pills. This information is not intended to replace advice given to you by your health care provider. Make sure you discuss any questions you have with your health care provider. Document Released: 11/17/2000 Document Revised: 12/26/2016 Document Reviewed: 12/26/2016 Elsevier Interactive Patient Education  2018 Elsevier Inc.  

## 2019-10-27 NOTE — Telephone Encounter (Signed)
[facility] Patient Teacher, English as a foreign language completed.    The patient is insured through [employer] and has a $0 copay.  [name]. Nadara Mustard [place] Patient [facility] for Infectious Disease Phone: [phone] Fax:  [phone]
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
## 2019-10-28 ENCOUNTER — Encounter: Payer: Medicare Other | Admitting: Family

## 2019-11-12 ENCOUNTER — Telehealth: Payer: Self-pay

## 2019-11-12 NOTE — Telephone Encounter (Signed)
Patient called office today to confirm if we received lab results from dialysis center. Will forward message to Terri Piedra, [place].  [place] P:([phone] [place], [place]

## 2019-11-14 NOTE — Telephone Encounter (Signed)
Placed in GC's box.

## 2019-11-20 ENCOUNTER — Other Ambulatory Visit: Payer: Self-pay | Admitting: Family

## 2019-11-20 MED ORDER — SOFOSBUVIR-VELPATASVIR 400-100 MG PO TABS: 1.0000 | ORAL_TABLET | Freq: Every day | ORAL | 2 refills | Status: DC

## 2019-11-20 NOTE — Telephone Encounter (Signed)
Please inform [name] that I did receive the lab work and it appears that he is low risk for liver fibrosis/scar tissue. I will be sending his [name] to the pharmacy.

## 2019-11-20 NOTE — Telephone Encounter (Signed)
Relayed to patient. [name] was sent to [facility].  Patient aware he will be getting a call to set up delivery and follow up appointments.

## 2019-11-20 NOTE — Progress Notes (Signed)
Mr. Freer is a 58 year old male with Genotype 1a chronic hepatitis C with viral load of 962,000. He is at low risk for liver fibrosis with APRI of 0.084 and FIB4 of 0.58. Will send Epclusa to the pharmacy for treatment.

## 2019-11-20 NOTE — Progress Notes (Signed)
He has [employer], will submit the prior authorization and reach out to the patient upon approval.

## 2019-11-20 NOTE — Telephone Encounter (Signed)
Patient calling for results of labs drawn by dialysis and plan moving forward. He has not yet heard anything or had a follow up appointment. Please advise. Landis Gandy, RN

## 2019-11-21 ENCOUNTER — Telehealth: Payer: Self-pay | Admitting: Pharmacy Technician

## 2019-11-21 NOTE — Telephone Encounter (Signed)
[facility] Patient Advocate Encounter   Received notification from [facility] that prior authorization for [name] is required.   PA submitted on 11/21/2019 Key [id number] Status is pending, determination predicted within 72 hours    [facility] will continue to follow.  Bartholomew Crews, CPhT Specialty Pharmacy Patient [facility] for Infectious Disease Phone: [phone] Fax: [phone] 11/21/2019 11:12 AM

## 2019-11-24 ENCOUNTER — Telehealth: Payer: Self-pay | Admitting: Pharmacist

## 2019-11-24 NOTE — Telephone Encounter (Signed)
Patient is approved to receive Epclusa x 12 weeks for chronic Hepatitis C infection. Counseled patient to take Epclusa daily with or without food. Encouraged patient not to miss any doses and explained how their chance of cure could go down with each dose missed. Counseled patient on what to do if dose is missed - if it is closer to the missed dose take immediately; if closer to next dose skip dose and take the next dose at the usual time.   Counseled patient on common side effects such as headache, fatigue, and nausea and that these normally decrease with time. I reviewed patient medications and found a drug interaction with Epclusa and Pepcid. He states that he doesn't take that every day and will try to stay away from it while on Epclusa. I told him to separate it if he decides to take it. Instructed patient to call clinic if he wishes to start a new medication during course of therapy. Also advised patient to call if he experiences any side effects. Patient will follow-up with me in the pharmacy clinic on 1/27.

## 2019-11-24 NOTE — Telephone Encounter (Signed)
[facility] Patient Advocate Encounter  Prior Authorization for [name] has been approved.    PA# [id number] Effective dates: 11/21/2019 through 02/13/2020  Patients co-pay is $0.00   [facility] will continue to follow.  Bartholomew Crews, CPhT Specialty Pharmacy Patient [facility] for Infectious Disease Phone: [phone] Fax: [phone] 11/24/2019 11:47 AM

## 2019-11-25 MED FILL — SOFOSBUVIR-VELPATASVIR 400-: 400-100 | 28 days supply | Qty: 28 | Fill #0

## 2019-11-26 ENCOUNTER — Encounter: Payer: Self-pay | Admitting: Pharmacy Technician

## 2019-12-18 MED FILL — SOFOSBUVIR-VELPATASVIR 400-: 400-100 | 28 days supply | Qty: 28 | Fill #1

## 2019-12-31 ENCOUNTER — Other Ambulatory Visit: Payer: Self-pay

## 2019-12-31 ENCOUNTER — Ambulatory Visit: Payer: Medicare Other | Admitting: Pharmacist

## 2019-12-31 DIAGNOSIS — M79641 Pain in right hand: Secondary | ICD-10-CM

## 2020-01-07 NOTE — Progress Notes (Unsigned)
Pt is here for 4-week follow up for hep C. Dx. Aug 2020 during dialysis screening. Hx of IVDU and blood transfusions. Previous blood work reviewed from 07/31/2019 with positive hepatitis C antibody, viral load of 962,333; and genotype 1a.  Platelet level on 09/17/19 230. Epclusa due to potential for DDIs because he is taking multiple meds.   Has insurance but got medication assistance for copay. No cost.  [name] for 12 weeks sent to [facility]. Lab work shows low risk for fibrosis  Any side effects? Such as headache, fatigue, nausea  Plan: check HCV viral load. Start date is 12/24 so he should be 5 weeks in. We need to check Hep A and B and HIV as well? May need hep vaccines?

## 2020-01-08 ENCOUNTER — Ambulatory Visit: Payer: Medicare Other | Admitting: Pharmacist

## 2020-01-13 ENCOUNTER — Ambulatory Visit (INDEPENDENT_AMBULATORY_CARE_PROVIDER_SITE_OTHER): Payer: Medicare Other | Admitting: Neurology

## 2020-01-13 ENCOUNTER — Other Ambulatory Visit: Payer: Self-pay

## 2020-01-13 DIAGNOSIS — M79641 Pain in right hand: Secondary | ICD-10-CM | POA: Diagnosis not present

## 2020-01-13 DIAGNOSIS — G629 Polyneuropathy, unspecified: Secondary | ICD-10-CM

## 2020-01-13 NOTE — Procedures (Signed)
[facility] Neurology  [address], [address]  [place], [place] [address] Tel: [phone] Fax:  [phone] Test Date:  01/13/2020  Patient: [name] DOB: 10-14-1961 Physician: Narda Amber, DO  Sex: Male Height: 6'4" Ref Phys: Charlotte Crumb, MD  ID#: [id number] Temp: 32.0C Technician:    Patient Complaints: This is a 59 year old man with peripheral vascular disease, end-stage renal disease on dialysis via left upper extremity AV fistula, diabetes mellitus, and hepatitis C referred for evaluation of bilateral hand paresthesias and right hand pain.  NCV & EMG Findings: Extensive electrodiagnostic testing of the right upper extremity and additional studies of the left shows:  1. Right median, bilateral ulnar, and bilateral radial sensory responses are absent.  Left median sensory response shows prolonged distal peak latency (4.5 ms) and reduced amplitude (7.5 V). 2. Bilateral median motor responses show normal latency, normal amplitude, and borderline slowed conduction velocity (Elbow-Wrist, L48, R48 m/s).  Bilateral ulnar motor responses show prolonged latency (R3.3, L3.8 ms) and slowed conduction velocity along the course of the nerve (32 - 43 m/s). 3. Chronic motor axonal loss changes are seen affecting the distal hand muscles bilaterally, without accompanied active denervation.  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  Overall, findings are severe in degree electrically.   ___________________________ Narda Amber, DO    Nerve Conduction Studies Anti Sensory Summary Table   Site NR Peak (ms) Norm Peak (ms) P-T Amp (V) Norm P-T Amp  Left Median Anti Sensory (2nd Digit)  32C  Wrist    4.5 <3.8 7.5 >10  Right Median Anti Sensory (2nd Digit)  32C  Wrist NR  <3.8  >10  Left Radial Anti Sensory (Base 1st Digit)  32C  Wrist NR   <2.8  >10  Right Radial Anti Sensory (Base 1st Digit)  32C  Wrist NR  <2.8  >10  Left Ulnar Anti Sensory (5th Digit)  32C  Wrist NR  <3.2  >5  Right Ulnar Anti Sensory (5th Digit)  32C  Wrist NR  <3.2  >5   Motor Summary Table   Site NR Onset (ms) Norm Onset (ms) O-P Amp (mV) Norm O-P Amp Site1 Site2 Delta-0 (ms) Dist (cm) Vel (m/s) Norm Vel (m/s)  Left Median Motor (Abd Poll Brev)  32C  Wrist    3.8 <4.0 9.1 >5 Elbow Wrist 7.1 34.0 48 >50  Elbow    10.9  8.1         Right Median Motor (Abd Poll Brev)  32C  Wrist    3.6 <4.0 7.3 >5 Elbow Wrist 6.7 32.0 48 >50  Elbow    10.3  6.8         Left Ulnar Motor (Abd Dig Minimi)  32C  Wrist    3.8 <3.1 7.3 >7 B Elbow Wrist 6.0 26.0 43 >50  B Elbow    9.8  5.8  A Elbow B Elbow 3.1 10.0 32 >50  A Elbow    12.9  5.4         Right Ulnar Motor (Abd Dig Minimi)  32C  Wrist    3.3 <3.1 8.2 >7 B Elbow Wrist 5.8 24.0 41 >50  B Elbow    9.1  6.4  A Elbow B Elbow 2.3 10.0 43 >50  A Elbow    11.4  6.0          EMG   Side Muscle Ins Act  Fibs Psw Fasc Number Recrt Dur Dur. Amp Amp. Poly Poly. Comment  Right 1stDorInt Nml Nml Nml Nml 1- Rapid Some 1+ Some 1+ Some 1+ N/A  Right Abd Poll Brev Nml Nml Nml Nml 1- Rapid Some 1+ Some 1+ Some 1+ N/A  Right Ext Indicis Nml Nml Nml Nml 1- Rapid Few 1+ Few 1+ Nml Nml N/A  Right PronatorTeres Nml Nml Nml Nml Nml Nml Nml Nml Nml Nml Nml Nml N/A  Right Biceps Nml Nml Nml Nml Nml Nml Nml Nml Nml Nml Nml Nml N/A  Right Triceps Nml Nml Nml Nml Nml Nml Nml Nml Nml Nml Nml Nml N/A  Right Deltoid Nml Nml Nml Nml Nml Nml Nml Nml Nml Nml Nml Nml N/A  Right ABD Dig Min Nml Nml Nml Nml 1- Rapid Some 1+ Some 1+ Some 1+ N/A  Left 1stDorInt Nml Nml Nml Nml 1- Rapid Some 1+ Some 1+ Some 1+ N/A  Left Ext Indicis Nml Nml Nml Nml 1- Rapid Few 1+ Few 1+ Nml Nml N/A  Left Abd Poll Brev Nml Nml Nml Nml 1- Rapid Some 1+ Some 1+ Some 1+ N/A      Waveforms:

## 2020-01-14 NOTE — Progress Notes (Unsigned)
Hep C dx during dialysis fall 2020. Last seen with Marya Amsler on 10/27/19. Hx of IVDU and blood transfusions. Initial viral load of [id number]. Low risk of dialysis. Epclusa x 12 weeks.   Should be done with Epclusa treatment. Get Hep C viral load and CMET. No vaccines needed.

## 2020-01-15 ENCOUNTER — Ambulatory Visit: Payer: Medicare Other | Admitting: Pharmacist

## 2020-01-20 ENCOUNTER — Ambulatory Visit (INDEPENDENT_AMBULATORY_CARE_PROVIDER_SITE_OTHER): Payer: Medicare Other | Admitting: Pharmacist

## 2020-01-20 ENCOUNTER — Telehealth: Payer: Self-pay | Admitting: Pharmacy Technician

## 2020-01-20 ENCOUNTER — Other Ambulatory Visit: Payer: Self-pay | Admitting: Orthopedic Surgery

## 2020-01-20 ENCOUNTER — Other Ambulatory Visit: Payer: Self-pay

## 2020-01-20 DIAGNOSIS — B182 Chronic viral hepatitis C: Secondary | ICD-10-CM

## 2020-01-20 DIAGNOSIS — Z79899 Other long term (current) drug therapy: Secondary | ICD-10-CM

## 2020-01-20 MED FILL — SOFOSBUVIR-VELPATASVIR 400-: 400-100 | 28 days supply | Qty: 28 | Fill #2

## 2020-01-20 NOTE — Telephone Encounter (Signed)
[facility]ID Patient Advocate Encounter   I was successful in securing patient a $30,000 grant from [employer] to provide copayment coverage for Epclusa. This will make the out of pocket cost $0.     I have spoken with the patient and he will have it shipped today from [facility]. This will be the last shipment of his 12 week treatment.    The billing information is: RxBin: [id number]  PCN: [id number] Member ID: [id number] Group ID: [id number] Dates of Eligibility: 12/21/2019 through 12/19/2020  Bartholomew Crews, [place] Patient [place] for Infectious Disease Phone: [phone] Fax: [phone] 01/20/2020 9:31 AM
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
## 2020-01-21 ENCOUNTER — Other Ambulatory Visit: Payer: Self-pay | Admitting: Orthopedic Surgery

## 2020-01-23 LAB — COMPREHENSIVE METABOLIC PANEL
AG Ratio: 1.2 (calc) (ref 1.0–2.5)
ALT: 10 U/L (ref 9–46)
AST: 11 U/L (ref 10–35)
Albumin: 4.1 g/dL (ref 3.6–5.1)
Alkaline phosphatase (APISO): 43 U/L (ref 35–144)
BUN/Creatinine Ratio: 6 (calc) (ref 6–22)
BUN: 47 mg/dL — ABNORMAL HIGH (ref 7–25)
CO2: 32 mmol/L (ref 20–32)
Calcium: 9.7 mg/dL (ref 8.6–10.3)
Chloride: 97 mmol/L — ABNORMAL LOW (ref 98–110)
Creat: 7.79 mg/dL — ABNORMAL HIGH (ref 0.70–1.25)
Globulin: 3.5 g/dL (calc) (ref 1.9–3.7)
Glucose, Bld: 205 mg/dL — ABNORMAL HIGH (ref 65–99)
Potassium: 4.6 mmol/L (ref 3.5–5.3)
Sodium: 141 mmol/L (ref 135–146)
Total Bilirubin: 0.3 mg/dL (ref 0.2–1.2)
Total Protein: 7.6 g/dL (ref 6.1–8.1)

## 2020-01-23 LAB — HEPATITIS C RNA QUANTITATIVE
HCV Quantitative Log: <1.18 Log IU/mL
HCV RNA, PCR, QN: <15 IU/mL

## 2020-02-03 ENCOUNTER — Encounter (HOSPITAL_COMMUNITY): Payer: Self-pay | Admitting: Orthopedic Surgery

## 2020-02-03 ENCOUNTER — Other Ambulatory Visit (HOSPITAL_COMMUNITY)
Admission: RE | Admit: 2020-02-03 | Discharge: 2020-02-03 | Disposition: A | Discharge status: Home / Self Care | Payer: Medicare Other | Source: Ambulatory Visit | Attending: Orthopedic Surgery | Admitting: Orthopedic Surgery

## 2020-02-03 ENCOUNTER — Other Ambulatory Visit: Payer: Self-pay

## 2020-02-03 DIAGNOSIS — Z01812 Encounter for preprocedural laboratory examination: Secondary | ICD-10-CM | POA: Diagnosis present

## 2020-02-03 DIAGNOSIS — Z20822 Contact with and (suspected) exposure to covid-19: Secondary | ICD-10-CM | POA: Insufficient documentation

## 2020-02-03 LAB — SARS CORONAVIRUS 2 (TAT 6-24 HRS): SARS Coronavirus 2: NEGATIVE

## 2020-02-03 NOTE — Progress Notes (Signed)
Spoke with pt for pre-op call. Pt denies cardiac history. Pt is treated for HTN, Diabetes and Hepatitis C. Pt's last A1C was 9.7 on 01/21/20 (I called [facility] and got that result). Pt states his fasting blood sugars have been running around 175. Instructed pt to take 1/2 of his regular dose of Lantus Insulin Wednesday PM and Thursday AM, he will take 12 units both times. Instructed pt to check his blood sugar when he gets up Thursday AM and every 2 hours until he leaves for the hospital. If blood sugar is 70 or below, treat with 1/2 cup of clear juice (apple or cranberry) and recheck blood sugar 15 minutes after drinking juice. If blood sugar continues to be 70 or below, call the Short Stay department and ask to speak to a nurse. Pt voiced understanding.  Covid test done today, pt states he's been in quarantine since he got the test. Pt will go to dialysis tomorrow and will wear his mask, distance and wash hands. He otherwise understands to stay in quarantine until he gets to the hospital on Thursday.

## 2020-02-05 ENCOUNTER — Other Ambulatory Visit: Payer: Self-pay

## 2020-02-05 ENCOUNTER — Encounter (HOSPITAL_COMMUNITY)
Admission: RE | Disposition: A | Discharge status: Home / Self Care | Payer: Self-pay | Source: Home / Self Care | Attending: Orthopedic Surgery

## 2020-02-05 ENCOUNTER — Ambulatory Visit (HOSPITAL_COMMUNITY): Payer: Medicare Other | Admitting: Certified Registered"

## 2020-02-05 ENCOUNTER — Encounter (HOSPITAL_COMMUNITY): Payer: Self-pay | Admitting: Orthopedic Surgery

## 2020-02-05 ENCOUNTER — Ambulatory Visit (HOSPITAL_COMMUNITY)
Admission: RE | Admit: 2020-02-05 | Discharge: 2020-02-05 | Disposition: A | Discharge status: Home / Self Care | Payer: Medicare Other | Attending: Orthopedic Surgery | Admitting: Orthopedic Surgery

## 2020-02-05 DIAGNOSIS — E1122 Type 2 diabetes mellitus with diabetic chronic kidney disease: Secondary | ICD-10-CM | POA: Insufficient documentation

## 2020-02-05 DIAGNOSIS — I120 Hypertensive chronic kidney disease with stage 5 chronic kidney disease or end stage renal disease: Secondary | ICD-10-CM | POA: Diagnosis not present

## 2020-02-05 DIAGNOSIS — M79641 Pain in right hand: Secondary | ICD-10-CM | POA: Diagnosis present

## 2020-02-05 DIAGNOSIS — Z794 Long term (current) use of insulin: Secondary | ICD-10-CM | POA: Insufficient documentation

## 2020-02-05 DIAGNOSIS — E1151 Type 2 diabetes mellitus with diabetic peripheral angiopathy without gangrene: Secondary | ICD-10-CM | POA: Diagnosis not present

## 2020-02-05 DIAGNOSIS — Z992 Dependence on renal dialysis: Secondary | ICD-10-CM | POA: Insufficient documentation

## 2020-02-05 DIAGNOSIS — Z89511 Acquired absence of right leg below knee: Secondary | ICD-10-CM | POA: Insufficient documentation

## 2020-02-05 DIAGNOSIS — Z87891 Personal history of nicotine dependence: Secondary | ICD-10-CM | POA: Insufficient documentation

## 2020-02-05 DIAGNOSIS — K219 Gastro-esophageal reflux disease without esophagitis: Secondary | ICD-10-CM | POA: Diagnosis not present

## 2020-02-05 DIAGNOSIS — Z89512 Acquired absence of left leg below knee: Secondary | ICD-10-CM | POA: Insufficient documentation

## 2020-02-05 DIAGNOSIS — N186 End stage renal disease: Secondary | ICD-10-CM | POA: Insufficient documentation

## 2020-02-05 DIAGNOSIS — G5601 Carpal tunnel syndrome, right upper limb: Secondary | ICD-10-CM | POA: Diagnosis not present

## 2020-02-05 DIAGNOSIS — Z79899 Other long term (current) drug therapy: Secondary | ICD-10-CM | POA: Insufficient documentation

## 2020-02-05 DIAGNOSIS — E1142 Type 2 diabetes mellitus with diabetic polyneuropathy: Secondary | ICD-10-CM | POA: Diagnosis not present

## 2020-02-05 HISTORY — PX: CARPAL TUNNEL RELEASE: SHX101

## 2020-02-05 HISTORY — DX: Inflammatory liver disease, unspecified: K75.9

## 2020-02-05 LAB — GLUCOSE, CAPILLARY
Glucose-Capillary: 154 mg/dL — ABNORMAL HIGH (ref 70–99)
Glucose-Capillary: 163 mg/dL — ABNORMAL HIGH (ref 70–99)

## 2020-02-05 LAB — POCT I-STAT, CHEM 8
BUN: 43 mg/dL — ABNORMAL HIGH (ref 6–20)
Calcium, Ion: 0.98 mmol/L — ABNORMAL LOW (ref 1.15–1.40)
Chloride: 98 mmol/L (ref 98–111)
Creatinine, Ser: 9.9 mg/dL — ABNORMAL HIGH (ref 0.61–1.24)
Glucose, Bld: 172 mg/dL — ABNORMAL HIGH (ref 70–99)
HCT: 33 % — ABNORMAL LOW (ref 39.0–52.0)
Hemoglobin: 11.2 g/dL — ABNORMAL LOW (ref 13.0–17.0)
Potassium: 4.7 mmol/L (ref 3.5–5.1)
Sodium: 135 mmol/L (ref 135–145)
TCO2: 29 mmol/L (ref 22–32)

## 2020-02-05 SURGERY — CARPAL TUNNEL RELEASE
Anesthesia: General | Site: Wrist | Laterality: Right

## 2020-02-05 MED ORDER — FENTANYL CITRATE (PF) 100 MCG/2ML IJ SOLN
INTRAMUSCULAR | Status: AC
  Administered 2020-02-05: 50 ug via INTRAVENOUS
  Filled 2020-02-05: qty 2

## 2020-02-05 MED ORDER — FENTANYL CITRATE (PF) 100 MCG/2ML IJ SOLN
INTRAMUSCULAR | Status: DC | PRN
  Administered 2020-02-05: 50 ug via INTRAVENOUS

## 2020-02-05 MED ORDER — EPHEDRINE SULFATE-NACL 50-0.9 MG/10ML-% IV SOSY
PREFILLED_SYRINGE | INTRAVENOUS | Status: DC | PRN
  Administered 2020-02-05: 10 mg via INTRAVENOUS

## 2020-02-05 MED ORDER — MIDAZOLAM HCL 2 MG/2ML IJ SOLN
INTRAMUSCULAR | Status: AC
  Filled 2020-02-05: qty 2

## 2020-02-05 MED ORDER — BUPIVACAINE HCL (PF) 0.25 % IJ SOLN
INTRAMUSCULAR | Status: DC | PRN
  Administered 2020-02-05: 10 mL

## 2020-02-05 MED ORDER — PROMETHAZINE HCL 25 MG/ML IJ SOLN: 6.2500 mg | INTRAMUSCULAR | Status: DC | PRN

## 2020-02-05 MED ORDER — ONDANSETRON HCL 4 MG/2ML IJ SOLN
INTRAMUSCULAR | Status: AC
  Filled 2020-02-05: qty 2

## 2020-02-05 MED ORDER — LIDOCAINE 2% (20 MG/ML) 5 ML SYRINGE
INTRAMUSCULAR | Status: AC
  Filled 2020-02-05: qty 5

## 2020-02-05 MED ORDER — CEFAZOLIN SODIUM-DEXTROSE 2-3 GM-%(50ML) IV SOLR
INTRAVENOUS | Status: DC | PRN
  Administered 2020-02-05: 2 g via INTRAVENOUS

## 2020-02-05 MED ORDER — FENTANYL CITRATE (PF) 100 MCG/2ML IJ SOLN: 25.0000 ug | INTRAMUSCULAR | Status: DC | PRN

## 2020-02-05 MED ORDER — PROPOFOL 10 MG/ML IV BOLUS
INTRAVENOUS | Status: AC
  Filled 2020-02-05: qty 20

## 2020-02-05 MED ORDER — SODIUM CHLORIDE 0.9 % IV SOLN: INTRAVENOUS | Status: DC

## 2020-02-05 MED ORDER — 0.9 % SODIUM CHLORIDE (POUR BTL) OPTIME
TOPICAL | Status: DC | PRN
  Administered 2020-02-05: 1000 mL

## 2020-02-05 MED ORDER — SUCCINYLCHOLINE CHLORIDE 200 MG/10ML IV SOSY
PREFILLED_SYRINGE | INTRAVENOUS | Status: AC
  Filled 2020-02-05: qty 10

## 2020-02-05 MED ORDER — MIDAZOLAM HCL 5 MG/5ML IJ SOLN
INTRAMUSCULAR | Status: DC | PRN
  Administered 2020-02-05: 2 mg via INTRAVENOUS

## 2020-02-05 MED ORDER — DEXAMETHASONE SODIUM PHOSPHATE 10 MG/ML IJ SOLN
INTRAMUSCULAR | Status: DC | PRN
  Administered 2020-02-05 (×2): 10 mg via INTRAVENOUS

## 2020-02-05 MED ORDER — FENTANYL CITRATE (PF) 250 MCG/5ML IJ SOLN
INTRAMUSCULAR | Status: AC
  Filled 2020-02-05: qty 5

## 2020-02-05 MED ORDER — PROPOFOL 10 MG/ML IV BOLUS
INTRAVENOUS | Status: DC | PRN
  Administered 2020-02-05: 20 mg via INTRAVENOUS
  Administered 2020-02-05: 150 mg via INTRAVENOUS
  Administered 2020-02-05: 30 mg via INTRAVENOUS

## 2020-02-05 MED ORDER — ROCURONIUM BROMIDE 10 MG/ML (PF) SYRINGE
PREFILLED_SYRINGE | INTRAVENOUS | Status: AC
  Filled 2020-02-05: qty 10

## 2020-02-05 MED ORDER — FENTANYL CITRATE (PF) 100 MCG/2ML IJ SOLN: 50.0000 ug | Freq: Once | INTRAMUSCULAR | Status: AC

## 2020-02-05 MED ORDER — DEXAMETHASONE SODIUM PHOSPHATE 10 MG/ML IJ SOLN
INTRAMUSCULAR | Status: AC
  Filled 2020-02-05: qty 1

## 2020-02-05 MED ORDER — BUPIVACAINE HCL (PF) 0.25 % IJ SOLN
INTRAMUSCULAR | Status: AC
  Filled 2020-02-05: qty 30

## 2020-02-05 MED ORDER — LIDOCAINE 2% (20 MG/ML) 5 ML SYRINGE
INTRAMUSCULAR | Status: DC | PRN
  Administered 2020-02-05: 80 mg via INTRAVENOUS

## 2020-02-05 MED ORDER — ONDANSETRON HCL 4 MG/2ML IJ SOLN
INTRAMUSCULAR | Status: DC | PRN
  Administered 2020-02-05: 4 mg via INTRAVENOUS

## 2020-02-05 SURGICAL SUPPLY — 42 items
BNDG CMPR 9X4 STRL LF SNTH (GAUZE/BANDAGES/DRESSINGS) ×1
BNDG ELASTIC 3X5.8 VLCR STR LF (GAUZE/BANDAGES/DRESSINGS) ×3 IMPLANT
BNDG ELASTIC 4X5.8 VLCR STR LF (GAUZE/BANDAGES/DRESSINGS) ×3 IMPLANT
BNDG ESMARK 4X9 LF (GAUZE/BANDAGES/DRESSINGS) ×3 IMPLANT
BNDG GAUZE ELAST 4 BULKY (GAUZE/BANDAGES/DRESSINGS) ×3 IMPLANT
CLOSURE WOUND 1/2 X4 (GAUZE/BANDAGES/DRESSINGS) ×1
CORD BIPOLAR FORCEPS 12FT (ELECTRODE) ×3 IMPLANT
COVER SURGICAL LIGHT HANDLE (MISCELLANEOUS) ×3 IMPLANT
COVER WAND RF STERILE (DRAPES) IMPLANT
CUFF TOURN SGL QUICK 18X4 (TOURNIQUET CUFF) IMPLANT
CUFF TOURN SGL QUICK 24 (TOURNIQUET CUFF)
CUFF TRNQT CYL 24X4X16.5-23 (TOURNIQUET CUFF) IMPLANT
DRAPE SURG 17X23 STRL (DRAPES) ×3 IMPLANT
DURAPREP 26ML APPLICATOR (WOUND CARE) ×3 IMPLANT
GAUZE SPONGE 4X4 12PLY STRL (GAUZE/BANDAGES/DRESSINGS) ×3 IMPLANT
GAUZE XEROFORM 1X8 LF (GAUZE/BANDAGES/DRESSINGS) ×3 IMPLANT
GLOVE SURG SYN 8.0 (GLOVE) ×3 IMPLANT
GOWN STRL REUS W/ TWL LRG LVL3 (GOWN DISPOSABLE) ×1 IMPLANT
GOWN STRL REUS W/ TWL XL LVL3 (GOWN DISPOSABLE) ×1 IMPLANT
GOWN STRL REUS W/TWL 2XL LVL3 (GOWN DISPOSABLE) ×3 IMPLANT
GOWN STRL REUS W/TWL LRG LVL3 (GOWN DISPOSABLE) ×3
GOWN STRL REUS W/TWL XL LVL3 (GOWN DISPOSABLE) ×3
KIT BASIN OR (CUSTOM PROCEDURE TRAY) ×3 IMPLANT
KIT TURNOVER KIT B (KITS) ×3 IMPLANT
NEEDLE HYPO 25GX1X1/2 BEV (NEEDLE) IMPLANT
NS IRRIG 1000ML POUR BTL (IV SOLUTION) ×3 IMPLANT
PACK ORTHO EXTREMITY (CUSTOM PROCEDURE TRAY) ×3 IMPLANT
PAD ARMBOARD 7.5X6 YLW CONV (MISCELLANEOUS) ×6 IMPLANT
PAD CAST 4YDX4 CTTN HI CHSV (CAST SUPPLIES) ×1 IMPLANT
PADDING CAST COTTON 4X4 STRL (CAST SUPPLIES) ×3
STRIP CLOSURE SKIN 1/2X4 (GAUZE/BANDAGES/DRESSINGS) ×2 IMPLANT
SUT ETHILON 4 0 PS 2 18 (SUTURE) IMPLANT
SUT PROLENE 3 0 PS 2 (SUTURE) IMPLANT
SUT VIC AB 3-0 PS2 18 (SUTURE)
SUT VIC AB 3-0 PS2 18XBRD (SUTURE) IMPLANT
SYR CONTROL 10ML LL (SYRINGE) IMPLANT
TOWEL GREEN STERILE (TOWEL DISPOSABLE) ×3 IMPLANT
TOWEL GREEN STERILE FF (TOWEL DISPOSABLE) ×3 IMPLANT
TUBE CONNECTING 12'X1/4 (SUCTIONS) ×1
TUBE CONNECTING 12X1/4 (SUCTIONS) ×2 IMPLANT
UNDERPAD 30X30 (UNDERPADS AND DIAPERS) ×3 IMPLANT
WATER STERILE IRR 1000ML POUR (IV SOLUTION) ×3 IMPLANT

## 2020-02-05 NOTE — Anesthesia Postprocedure Evaluation (Signed)
Anesthesia Post Note  Patient: [name]  Procedure(s) Performed: RIGHT CARPAL TUNNEL RELEASE (Right Wrist)     Patient location during evaluation: PACU Anesthesia Type: General Level of consciousness: awake and alert Pain management: pain level controlled Vital Signs Assessment: post-procedure vital signs reviewed and stable Respiratory status: spontaneous breathing, nonlabored ventilation, respiratory function stable and patient connected to nasal cannula oxygen Cardiovascular status: blood pressure returned to baseline and stable Postop Assessment: no apparent nausea or vomiting Anesthetic complications: no    Last Vitals:  Vitals:   02/05/20 1315 02/05/20 1330  BP: (!) 148/85 131/84  Pulse: 86 89  Resp: 12 11  Temp:  36.4 C  SpO2: 95% 95%    Last Pain:  Vitals:   02/05/20 1330  TempSrc:   PainSc: 0-No pain                 Jackelin Correia S

## 2020-02-05 NOTE — Anesthesia Preprocedure Evaluation (Signed)
Anesthesia Evaluation  Patient identified by MRN, date of birth, ID band Patient awake    Reviewed: Allergy & Precautions, NPO status , Patient's Chart, lab work & pertinent test results  Airway Mallampati: II  TM Distance: >3 FB Neck ROM: Full    Dental no notable dental hx.    Pulmonary neg pulmonary ROS, former smoker,    Pulmonary exam normal breath sounds clear to auscultation       Cardiovascular hypertension, Pt. on medications + Peripheral Vascular Disease  Normal cardiovascular exam Rhythm:Regular Rate:Normal     Neuro/Psych negative neurological ROS  negative psych ROS   GI/Hepatic negative GI ROS, (+) Hepatitis -, C  Endo/Other  negative endocrine ROSdiabetes, Insulin Dependent  Renal/GU DialysisRenal disease  negative genitourinary   Musculoskeletal negative musculoskeletal ROS (+)   Abdominal   Peds negative pediatric ROS (+)  Hematology negative hematology ROS (+)   Anesthesia Other Findings   Reproductive/Obstetrics negative OB ROS                             Anesthesia Physical Anesthesia Plan  ASA: IV  Anesthesia Plan: General   Post-op Pain Management:    Induction: Intravenous  PONV Risk Score and Plan: 2 and Ondansetron, Dexamethasone and Treatment may vary due to age or medical condition  Airway Management Planned: LMA  Additional Equipment:   Intra-op Plan:   Post-operative Plan: Extubation in OR  Informed Consent: I have reviewed the patients History and Physical, chart, labs and discussed the procedure including the risks, benefits and alternatives for the proposed anesthesia with the patient or authorized representative who has indicated his/her understanding and acceptance.     Dental advisory given  Plan Discussed with: CRNA and Surgeon  Anesthesia Plan Comments:         Anesthesia Quick Evaluation

## 2020-02-05 NOTE — Transfer of Care (Signed)
Immediate Anesthesia Transfer of Care Note  Patient: [name]  Procedure(s) Performed: RIGHT CARPAL TUNNEL RELEASE (Right Wrist)  Patient Location: PACU  Anesthesia Type:General  Level of Consciousness: drowsy and patient cooperative  Airway & Oxygen Therapy: Patient Spontanous Breathing  Post-op Assessment: Report given to RN and Post -op Vital signs reviewed and stable  Post vital signs: Reviewed and stable  Last Vitals:  Vitals Value Taken Time  BP 143/88 02/05/20 1300  Temp    Pulse 86 02/05/20 1302  Resp 8 02/05/20 1302  SpO2 92 % 02/05/20 1302  Vitals shown include unvalidated device data.  Last Pain:  Vitals:   02/05/20 1019  TempSrc:   PainSc: 10-Worst pain ever      Patients Stated Pain Goal: 3 ([id number])  Complications: No apparent anesthesia complications

## 2020-02-05 NOTE — Brief Op Note (Signed)
02/05/2020  12:50 PM  PATIENT:  [name]  59 y.o. male  PRE-OPERATIVE DIAGNOSIS:  RIGHT CARPAL TUNNEL SYNDROME  POST-OPERATIVE DIAGNOSIS:  RIGHT CARPAL TUNNEL SYNDROME  PROCEDURE:  Procedure(s): RIGHT CARPAL TUNNEL RELEASE (Right)  SURGEON:  Surgeon(s) and Role:    * Charlotte Crumb, MD - Primary  PHYSICIAN ASSISTANT:   ASSISTANTS: none   ANESTHESIA:   general  EBL:  5 mL   BLOOD ADMINISTERED:none  DRAINS: none   LOCAL MEDICATIONS USED:  MARCAINE     SPECIMEN:    DISPOSITION OF SPECIMEN:  N/A  COUNTS:  YES  TOURNIQUET:   Total Tourniquet Time Documented: area (laterality) - 14 minutes Total: area (laterality) - 14 minutes   DICTATION: .Dragon Dictation  PLAN OF CARE: Discharge to home after PACU  PATIENT DISPOSITION:  PACU - hemodynamically stable.   Delay start of Pharmacological VTE agent (>24hrs) due to surgical blood loss or risk of bleeding: not applicable

## 2020-02-05 NOTE — Progress Notes (Signed)
Pt having R carpal tunnel release. Has fistula in L arm for HD access. Called and spoke with Dr. Burney Gauze, informed okay to place IV in [facility] of operative arm.
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
## 2020-02-05 NOTE — Anesthesia Procedure Notes (Signed)
Procedure Name: LMA Insertion Date/Time: 02/05/2020 12:25 PM Performed by: Lance Coon, CRNA Pre-anesthesia Checklist: Patient identified, Emergency Drugs available, Suction available, Patient being monitored and Timeout performed Patient Re-evaluated:Patient Re-evaluated prior to induction Oxygen Delivery Method: Circle system utilized Preoxygenation: Pre-oxygenation with 100% oxygen Induction Type: IV induction LMA: LMA inserted LMA Size: 5.0 Number of attempts: 1 Placement Confirmation: ETT inserted through vocal cords under direct vision,  positive ETCO2 and breath sounds checked- equal and bilateral Tube secured with: Tape Dental Injury: Teeth and Oropharynx as per pre-operative assessment

## 2020-02-05 NOTE — Op Note (Signed)
Patient was taken the operating suite and after induction of adequate general anesthetic right upper extremity was prepped and draped in the usual sterile fashion.  An Esmarch was used to exsanguinate the limb and a forearm tourniquet was inflated to 250 mmHg.  This point time incision was made the palmar aspect of the right hand and long to the metacarpal starting Kaplan's cardinal line.  The skin was incised dissection was carried down to the level of the palmar fascia.  The palmar fascia was split in the distal edge of the transverse carpal limb was identified split the 15 blade.  Median nerve is identified protected with a freer elevator.  The main aspect of the transverse carpal ligament was then divided on direct vision using a curved blunt scissor.  The canal was inspected.  There were no osseous lesions or ganglions present.  He was irrigated loosely closed with 3-0 Prolene interrupted horizontal mattress sutures.  Xeroform, 4 x 4's, and a compressive bandage was applied.  Patient tolerated this procedure well went to come in stable fashion.

## 2020-02-18 ENCOUNTER — Other Ambulatory Visit: Payer: Self-pay | Admitting: Orthopedic Surgery

## 2020-02-24 ENCOUNTER — Other Ambulatory Visit (HOSPITAL_COMMUNITY)
Admission: RE | Admit: 2020-02-24 | Discharge: 2020-02-24 | Disposition: A | Discharge status: Home / Self Care | Payer: Medicare Other | Source: Ambulatory Visit | Attending: Orthopedic Surgery | Admitting: Orthopedic Surgery

## 2020-02-24 DIAGNOSIS — K219 Gastro-esophageal reflux disease without esophagitis: Secondary | ICD-10-CM | POA: Diagnosis not present

## 2020-02-24 DIAGNOSIS — Z89512 Acquired absence of left leg below knee: Secondary | ICD-10-CM | POA: Diagnosis not present

## 2020-02-24 DIAGNOSIS — Z20822 Contact with and (suspected) exposure to covid-19: Secondary | ICD-10-CM | POA: Diagnosis not present

## 2020-02-24 DIAGNOSIS — B1920 Unspecified viral hepatitis C without hepatic coma: Secondary | ICD-10-CM | POA: Diagnosis not present

## 2020-02-24 DIAGNOSIS — E1122 Type 2 diabetes mellitus with diabetic chronic kidney disease: Secondary | ICD-10-CM | POA: Diagnosis not present

## 2020-02-24 DIAGNOSIS — E1151 Type 2 diabetes mellitus with diabetic peripheral angiopathy without gangrene: Secondary | ICD-10-CM | POA: Diagnosis not present

## 2020-02-24 DIAGNOSIS — N186 End stage renal disease: Secondary | ICD-10-CM | POA: Diagnosis not present

## 2020-02-24 DIAGNOSIS — Z79899 Other long term (current) drug therapy: Secondary | ICD-10-CM | POA: Diagnosis not present

## 2020-02-24 DIAGNOSIS — Z87891 Personal history of nicotine dependence: Secondary | ICD-10-CM | POA: Diagnosis not present

## 2020-02-24 DIAGNOSIS — Z794 Long term (current) use of insulin: Secondary | ICD-10-CM | POA: Diagnosis not present

## 2020-02-24 DIAGNOSIS — I120 Hypertensive chronic kidney disease with stage 5 chronic kidney disease or end stage renal disease: Secondary | ICD-10-CM | POA: Diagnosis not present

## 2020-02-24 DIAGNOSIS — E11622 Type 2 diabetes mellitus with other skin ulcer: Secondary | ICD-10-CM | POA: Diagnosis not present

## 2020-02-24 DIAGNOSIS — Z89511 Acquired absence of right leg below knee: Secondary | ICD-10-CM | POA: Diagnosis not present

## 2020-02-24 DIAGNOSIS — Z992 Dependence on renal dialysis: Secondary | ICD-10-CM | POA: Diagnosis not present

## 2020-02-24 DIAGNOSIS — L089 Local infection of the skin and subcutaneous tissue, unspecified: Secondary | ICD-10-CM | POA: Diagnosis present

## 2020-02-24 DIAGNOSIS — I440 Atrioventricular block, first degree: Secondary | ICD-10-CM | POA: Diagnosis not present

## 2020-02-24 DIAGNOSIS — Z8249 Family history of ischemic heart disease and other diseases of the circulatory system: Secondary | ICD-10-CM | POA: Diagnosis not present

## 2020-02-24 DIAGNOSIS — L98498 Non-pressure chronic ulcer of skin of other sites with other specified severity: Secondary | ICD-10-CM | POA: Diagnosis not present

## 2020-02-24 DIAGNOSIS — E1140 Type 2 diabetes mellitus with diabetic neuropathy, unspecified: Secondary | ICD-10-CM | POA: Diagnosis not present

## 2020-02-24 LAB — SARS CORONAVIRUS 2 (TAT 6-24 HRS): SARS Coronavirus 2: NEGATIVE

## 2020-02-25 ENCOUNTER — Encounter (HOSPITAL_COMMUNITY): Payer: Self-pay | Admitting: Orthopedic Surgery

## 2020-02-25 ENCOUNTER — Other Ambulatory Visit: Payer: Self-pay

## 2020-02-25 NOTE — Anesthesia Preprocedure Evaluation (Addendum)
Anesthesia Evaluation  Patient identified by MRN, date of birth, ID band Patient awake    Reviewed: Allergy & Precautions, NPO status , Patient's Chart, lab work & pertinent test results  History of Anesthesia Complications Negative for: history of anesthetic complications  Airway Mallampati: II  TM Distance: >3 FB Neck ROM: Full    Dental  (+) Partial Upper   Pulmonary neg pulmonary ROS, former smoker,    Pulmonary exam normal        Cardiovascular hypertension, Pt. on medications and Pt. on home beta blockers + Peripheral Vascular Disease  Normal cardiovascular exam     Neuro/Psych negative neurological ROS  negative psych ROS   GI/Hepatic GERD  ,(+) Hepatitis -, C  Endo/Other  diabetes (A1c 9.7%), Poorly Controlled, Type 2, Insulin Dependent  Renal/GU ESRF and DialysisRenal disease  negative genitourinary   Musculoskeletal negative musculoskeletal ROS (+)   Abdominal   Peds  Hematology  (+) anemia ,   Anesthesia Other Findings  Last dialysis Monday 3/22 K 5.0  Reproductive/Obstetrics                           Anesthesia Physical Anesthesia Plan  ASA: IV  Anesthesia Plan: MAC   Post-op Pain Management:    Induction: Intravenous  PONV Risk Score and Plan: 1 and Propofol infusion, TIVA and Treatment may vary due to age or medical condition  Airway Management Planned: Natural Airway, Nasal Cannula and Simple Face Mask  Additional Equipment: None  Intra-op Plan:   Post-operative Plan:   Informed Consent: I have reviewed the patients History and Physical, chart, labs and discussed the procedure including the risks, benefits and alternatives for the proposed anesthesia with the patient or authorized representative who has indicated his/her understanding and acceptance.       Plan Discussed with:   Anesthesia Plan Comments: (PAT note written 02/25/2020 by Myra Gianotti,  PA-C. )      Anesthesia Quick Evaluation

## 2020-02-25 NOTE — Progress Notes (Signed)
Patient denies shortness of breath, fever, cough and chest pain.  PCP - Dr Vassie Moment Cardiologist - denies Inf Disease - Mauricio Po, FNP  Chest x-ray - denies EKG - 02/05/20 Stress Test - denies ECHO - denies Cardiac Cath - denies  Fasting Blood Sugar - 120s Checks Blood Sugar - EOD      . THE MORNING OF SURGERY, take 12 units Lantus insulin.  . If your blood sugar is less than 70 mg/dL, you will need to treat for low blood sugar: o Treat a low blood sugar (less than 70 mg/dL) with  cup of clear juice (cranberry or apple), 4 glucose tablets, OR glucose gel. o Recheck blood sugar in 15 minutes after treatment (to make sure it is greater than 70 mg/dL). If your blood sugar is not greater than 70 mg/dL on recheck, call [phone] for further instructions.  ERAS: Clears til 4:30 am DOS, no drink.  Anesthesia review: Yes  STOP now taking any Aspirin (unless otherwise instructed by your surgeon), Aleve, Naproxen, Ibuprofen, Motrin, Advil, Goody's, BC's, all herbal medications, fish oil, and all vitamins.   Coronavirus Screening Covid test on 02/24/20 was negative.  Patient verbalized understanding of instructions that were given via phone.
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
## 2020-02-26 ENCOUNTER — Ambulatory Visit (HOSPITAL_COMMUNITY): Payer: Medicare Other | Admitting: Vascular Surgery

## 2020-02-26 ENCOUNTER — Encounter (HOSPITAL_COMMUNITY)
Admission: RE | Disposition: A | Discharge status: Home / Self Care | Payer: Self-pay | Source: Home / Self Care | Attending: Orthopedic Surgery

## 2020-02-26 ENCOUNTER — Encounter (HOSPITAL_COMMUNITY): Payer: Self-pay | Admitting: Orthopedic Surgery

## 2020-02-26 ENCOUNTER — Ambulatory Visit (HOSPITAL_COMMUNITY)
Admission: RE | Admit: 2020-02-26 | Discharge: 2020-02-26 | Disposition: A | Discharge status: Home / Self Care | Payer: Medicare Other | Attending: Orthopedic Surgery | Admitting: Orthopedic Surgery

## 2020-02-26 DIAGNOSIS — I120 Hypertensive chronic kidney disease with stage 5 chronic kidney disease or end stage renal disease: Secondary | ICD-10-CM | POA: Insufficient documentation

## 2020-02-26 DIAGNOSIS — I440 Atrioventricular block, first degree: Secondary | ICD-10-CM | POA: Insufficient documentation

## 2020-02-26 DIAGNOSIS — L089 Local infection of the skin and subcutaneous tissue, unspecified: Secondary | ICD-10-CM | POA: Diagnosis not present

## 2020-02-26 DIAGNOSIS — Z79899 Other long term (current) drug therapy: Secondary | ICD-10-CM | POA: Insufficient documentation

## 2020-02-26 DIAGNOSIS — N186 End stage renal disease: Secondary | ICD-10-CM | POA: Insufficient documentation

## 2020-02-26 DIAGNOSIS — E11622 Type 2 diabetes mellitus with other skin ulcer: Secondary | ICD-10-CM | POA: Insufficient documentation

## 2020-02-26 DIAGNOSIS — L98498 Non-pressure chronic ulcer of skin of other sites with other specified severity: Secondary | ICD-10-CM | POA: Insufficient documentation

## 2020-02-26 DIAGNOSIS — Z20822 Contact with and (suspected) exposure to covid-19: Secondary | ICD-10-CM | POA: Insufficient documentation

## 2020-02-26 DIAGNOSIS — Z8249 Family history of ischemic heart disease and other diseases of the circulatory system: Secondary | ICD-10-CM | POA: Insufficient documentation

## 2020-02-26 DIAGNOSIS — E1140 Type 2 diabetes mellitus with diabetic neuropathy, unspecified: Secondary | ICD-10-CM | POA: Insufficient documentation

## 2020-02-26 DIAGNOSIS — Z87891 Personal history of nicotine dependence: Secondary | ICD-10-CM | POA: Insufficient documentation

## 2020-02-26 DIAGNOSIS — Z89512 Acquired absence of left leg below knee: Secondary | ICD-10-CM | POA: Insufficient documentation

## 2020-02-26 DIAGNOSIS — Z89511 Acquired absence of right leg below knee: Secondary | ICD-10-CM | POA: Insufficient documentation

## 2020-02-26 DIAGNOSIS — B1920 Unspecified viral hepatitis C without hepatic coma: Secondary | ICD-10-CM | POA: Insufficient documentation

## 2020-02-26 DIAGNOSIS — E1122 Type 2 diabetes mellitus with diabetic chronic kidney disease: Secondary | ICD-10-CM | POA: Insufficient documentation

## 2020-02-26 DIAGNOSIS — K219 Gastro-esophageal reflux disease without esophagitis: Secondary | ICD-10-CM | POA: Insufficient documentation

## 2020-02-26 DIAGNOSIS — Z992 Dependence on renal dialysis: Secondary | ICD-10-CM | POA: Insufficient documentation

## 2020-02-26 DIAGNOSIS — E1151 Type 2 diabetes mellitus with diabetic peripheral angiopathy without gangrene: Secondary | ICD-10-CM | POA: Diagnosis not present

## 2020-02-26 DIAGNOSIS — Z794 Long term (current) use of insulin: Secondary | ICD-10-CM | POA: Insufficient documentation

## 2020-02-26 HISTORY — PX: AMPUTATION: SHX166

## 2020-02-26 LAB — GLUCOSE, CAPILLARY
Glucose-Capillary: 68 mg/dL — ABNORMAL LOW (ref 70–99)
Glucose-Capillary: 90 mg/dL (ref 70–99)
Glucose-Capillary: 62 mg/dL — ABNORMAL LOW (ref 70–99)
Glucose-Capillary: 82 mg/dL (ref 70–99)

## 2020-02-26 LAB — POCT I-STAT, CHEM 8
BUN: 79 mg/dL — ABNORMAL HIGH (ref 6–20)
Calcium, Ion: 0.96 mmol/L — ABNORMAL LOW (ref 1.15–1.40)
Chloride: 108 mmol/L (ref 98–111)
Creatinine, Ser: 14.5 mg/dL — ABNORMAL HIGH (ref 0.61–1.24)
Glucose, Bld: 60 mg/dL — ABNORMAL LOW (ref 70–99)
HCT: 30 % — ABNORMAL LOW (ref 39.0–52.0)
Hemoglobin: 10.2 g/dL — ABNORMAL LOW (ref 13.0–17.0)
Potassium: 5 mmol/L (ref 3.5–5.1)
Sodium: 137 mmol/L (ref 135–145)
TCO2: 17 mmol/L — ABNORMAL LOW (ref 22–32)

## 2020-02-26 LAB — SURGICAL PCR SCREEN
MRSA, PCR: NEGATIVE
Staphylococcus aureus: NEGATIVE

## 2020-02-26 SURGERY — AMPUTATION DIGIT
Anesthesia: Monitor Anesthesia Care | Laterality: Right

## 2020-02-26 MED ORDER — CEFAZOLIN SODIUM-DEXTROSE 2-4 GM/100ML-% IV SOLN
INTRAVENOUS | Status: AC
  Filled 2020-02-26: qty 100

## 2020-02-26 MED ORDER — DEXTROSE 50 % IV SOLN
INTRAVENOUS | Status: AC
  Filled 2020-02-26: qty 50

## 2020-02-26 MED ORDER — DEXTROSE 50 % IV SOLN
INTRAVENOUS | Status: AC
  Administered 2020-02-26: 12.5 g via INTRAVENOUS
  Filled 2020-02-26: qty 50

## 2020-02-26 MED ORDER — DEXTROSE 50 % IV SOLN
25.0000 mL | Freq: Once | INTRAVENOUS | Status: AC
  Administered 2020-02-26: 25 mL via INTRAVENOUS

## 2020-02-26 MED ORDER — CARVEDILOL 12.5 MG PO TABS
12.5000 mg | ORAL_TABLET | Freq: Once | ORAL | Status: AC
  Administered 2020-02-26: 12.5 mg via ORAL
  Filled 2020-02-26: qty 1

## 2020-02-26 MED ORDER — CEFAZOLIN SODIUM-DEXTROSE 2-4 GM/100ML-% IV SOLN
2.0000 g | INTRAVENOUS | Status: AC
  Administered 2020-02-26: 2 g via INTRAVENOUS

## 2020-02-26 MED ORDER — PROPOFOL 500 MG/50ML IV EMUL
INTRAVENOUS | Status: DC | PRN
  Administered 2020-02-26: 50 ug/kg/min via INTRAVENOUS

## 2020-02-26 MED ORDER — ONDANSETRON HCL 4 MG/2ML IJ SOLN: 4.0000 mg | Freq: Once | INTRAMUSCULAR | Status: DC | PRN

## 2020-02-26 MED ORDER — MIDAZOLAM HCL 2 MG/2ML IJ SOLN
INTRAMUSCULAR | Status: AC
  Filled 2020-02-26: qty 2

## 2020-02-26 MED ORDER — OXYCODONE HCL 5 MG/5ML PO SOLN: 5.0000 mg | Freq: Once | ORAL | Status: DC | PRN

## 2020-02-26 MED ORDER — SODIUM CHLORIDE 0.9 % IV SOLN: INTRAVENOUS | Status: DC

## 2020-02-26 MED ORDER — CHLORHEXIDINE GLUCONATE 4 % EX LIQD: 60.0000 mL | Freq: Once | CUTANEOUS | Status: DC

## 2020-02-26 MED ORDER — DEXTROSE 50 % IV SOLN
12.5000 g | INTRAVENOUS | Status: AC
  Filled 2020-02-26: qty 50

## 2020-02-26 MED ORDER — LIDOCAINE-EPINEPHRINE 1 %-1:100000 IJ SOLN
INTRAMUSCULAR | Status: DC | PRN
  Administered 2020-02-26: 3 mL via INTRADERMAL

## 2020-02-26 MED ORDER — OXYCODONE HCL 5 MG PO TABS: 5.0000 mg | ORAL_TABLET | Freq: Once | ORAL | Status: DC | PRN

## 2020-02-26 MED ORDER — FENTANYL CITRATE (PF) 100 MCG/2ML IJ SOLN
INTRAMUSCULAR | Status: DC | PRN
  Administered 2020-02-26: 50 ug via INTRAVENOUS
  Administered 2020-02-26: 25 ug via INTRAVENOUS

## 2020-02-26 MED ORDER — LIDOCAINE-EPINEPHRINE 1 %-1:100000 IJ SOLN
INTRAMUSCULAR | Status: AC
  Filled 2020-02-26: qty 1

## 2020-02-26 MED ORDER — FENTANYL CITRATE (PF) 250 MCG/5ML IJ SOLN
INTRAMUSCULAR | Status: AC
  Filled 2020-02-26: qty 5

## 2020-02-26 MED ORDER — MIDAZOLAM HCL 5 MG/5ML IJ SOLN
INTRAMUSCULAR | Status: DC | PRN
  Administered 2020-02-26: 1 mg via INTRAVENOUS

## 2020-02-26 MED ORDER — FENTANYL CITRATE (PF) 100 MCG/2ML IJ SOLN: 25.0000 ug | INTRAMUSCULAR | Status: DC | PRN

## 2020-02-26 MED ORDER — PROPOFOL 10 MG/ML IV BOLUS
INTRAVENOUS | Status: AC
  Filled 2020-02-26: qty 40

## 2020-02-26 MED ORDER — SODIUM BICARBONATE 4 % IV SOLN
INTRAVENOUS | Status: DC | PRN
  Administered 2020-02-26: 3 mL

## 2020-02-26 SURGICAL SUPPLY — 48 items
BNDG CMPR 9X4 STRL LF SNTH (GAUZE/BANDAGES/DRESSINGS) ×1
BNDG COHESIVE 1X5 TAN STRL LF (GAUZE/BANDAGES/DRESSINGS) ×2 IMPLANT
BNDG CONFORM 2 STRL LF (GAUZE/BANDAGES/DRESSINGS) IMPLANT
BNDG ELASTIC 3X5.8 VLCR STR LF (GAUZE/BANDAGES/DRESSINGS) IMPLANT
BNDG ESMARK 4X9 LF (GAUZE/BANDAGES/DRESSINGS) ×3 IMPLANT
BNDG GAUZE ELAST 4 BULKY (GAUZE/BANDAGES/DRESSINGS) IMPLANT
CLOSURE WOUND 1/2 X4 (GAUZE/BANDAGES/DRESSINGS)
CORD BIPOLAR FORCEPS 12FT (ELECTRODE) ×3 IMPLANT
COVER SURGICAL LIGHT HANDLE (MISCELLANEOUS) ×3 IMPLANT
COVER WAND RF STERILE (DRAPES) ×3 IMPLANT
CUFF TOURN SGL QUICK 18X4 (TOURNIQUET CUFF) IMPLANT
CUFF TOURN SGL QUICK 24 (TOURNIQUET CUFF)
CUFF TRNQT CYL 24X4X16.5-23 (TOURNIQUET CUFF) IMPLANT
DRAPE OEC MINIVIEW 54X84 (DRAPES) IMPLANT
DRAPE SURG 17X23 STRL (DRAPES) ×3 IMPLANT
DURAPREP 26ML APPLICATOR (WOUND CARE) ×3 IMPLANT
GAUZE SPONGE 2X2 8PLY STRL LF (GAUZE/BANDAGES/DRESSINGS) IMPLANT
GAUZE SPONGE 4X4 12PLY STRL (GAUZE/BANDAGES/DRESSINGS) IMPLANT
GAUZE SPONGE 4X4 16PLY XRAY LF (GAUZE/BANDAGES/DRESSINGS) ×2 IMPLANT
GAUZE XEROFORM 1X8 LF (GAUZE/BANDAGES/DRESSINGS) IMPLANT
GAUZE XEROFORM 5X9 LF (GAUZE/BANDAGES/DRESSINGS) ×2 IMPLANT
GLOVE SURG SYN 8.0 (GLOVE) ×3 IMPLANT
GLOVE SURG SYN 8.0 PF PI (GLOVE) ×1 IMPLANT
GOWN STRL REUS W/ TWL LRG LVL3 (GOWN DISPOSABLE) ×1 IMPLANT
GOWN STRL REUS W/ TWL XL LVL3 (GOWN DISPOSABLE) ×1 IMPLANT
GOWN STRL REUS W/TWL LRG LVL3 (GOWN DISPOSABLE) ×3
GOWN STRL REUS W/TWL XL LVL3 (GOWN DISPOSABLE) ×3
KIT BASIN OR (CUSTOM PROCEDURE TRAY) ×3 IMPLANT
KIT TURNOVER KIT B (KITS) ×3 IMPLANT
MANIFOLD NEPTUNE II (INSTRUMENTS) ×3 IMPLANT
NDL HYPO 25GX1X1/2 BEV (NEEDLE) IMPLANT
NEEDLE HYPO 25GX1X1/2 BEV (NEEDLE) IMPLANT
NS IRRIG 1000ML POUR BTL (IV SOLUTION) ×3 IMPLANT
PACK ORTHO EXTREMITY (CUSTOM PROCEDURE TRAY) ×3 IMPLANT
PAD ARMBOARD 7.5X6 YLW CONV (MISCELLANEOUS) ×6 IMPLANT
PAD CAST 3X4 CTTN HI CHSV (CAST SUPPLIES) IMPLANT
PADDING CAST COTTON 3X4 STRL (CAST SUPPLIES)
SPECIMEN JAR SMALL (MISCELLANEOUS) ×3 IMPLANT
SPONGE GAUZE 2X2 STER 10/PKG (GAUZE/BANDAGES/DRESSINGS)
STRIP CLOSURE SKIN 1/2X4 (GAUZE/BANDAGES/DRESSINGS) IMPLANT
SUCTION FRAZIER HANDLE 10FR (MISCELLANEOUS)
SUCTION TUBE FRAZIER 10FR DISP (MISCELLANEOUS) IMPLANT
TOWEL GREEN STERILE (TOWEL DISPOSABLE) ×3 IMPLANT
TOWEL GREEN STERILE FF (TOWEL DISPOSABLE) ×3 IMPLANT
TUBE CONNECTING 12'X1/4 (SUCTIONS)
TUBE CONNECTING 12X1/4 (SUCTIONS) IMPLANT
UNDERPAD 30X30 (UNDERPADS AND DIAPERS) ×3 IMPLANT
WATER STERILE IRR 1000ML POUR (IV SOLUTION) ×3 IMPLANT

## 2020-02-26 NOTE — Progress Notes (Signed)
Orthopedic Tech Progress Note Patient Details:  [name] July 01, 1961 [id number]  Ortho Devices Type of Ortho Device: Arm sling Ortho Device/Splint Location: right Ortho Device/Splint Interventions: Application   Post Interventions Patient Tolerated: Well Instructions Provided: Care of device   [name] 02/26/2020, 9:02 AM

## 2020-02-26 NOTE — Transfer of Care (Signed)
Immediate Anesthesia Transfer of Care Note  Patient: [name]  Procedure(s) Performed: Right interphalanx amputation (Right )  Patient Location: PACU  Anesthesia Type:MAC  Level of Consciousness: awake, alert , oriented and sedated  Airway & Oxygen Therapy: Patient Spontanous Breathing and Patient connected to nasal cannula oxygen  Post-op Assessment: Report given to RN, Post -op Vital signs reviewed and stable and Patient moving all extremities  Post vital signs: Reviewed and stable  Last Vitals:  Vitals Value Taken Time  BP 159/65 02/26/20 0814  Temp    Pulse 72 02/26/20 0816  Resp 11 02/26/20 0816  SpO2 99 % 02/26/20 0816  Vitals shown include unvalidated device data.  Last Pain:  Vitals:   02/26/20 0635  TempSrc:   PainSc: 0-No pain      Patients Stated Pain Goal: 3 (01/77/93 9030)  Complications: No apparent anesthesia complications
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
## 2020-02-26 NOTE — Brief Op Note (Signed)
02/26/2020  8:07 AM  PATIENT:  [name]  59 y.o. male  PRE-OPERATIVE DIAGNOSIS:  right index finger infection  POST-OPERATIVE DIAGNOSIS:  right index finger infection  PROCEDURE:  Procedure(s): Right interphalanx amputation (Right)  SURGEON:  Surgeon(s) and Role:    Charlotte Crumb, MD - Primary  PHYSICIAN ASSISTANT:   ASSISTANTS: none   ANESTHESIA:   IV sedation  EBL:   None BLOOD ADMINISTERED:none  DRAINS: none   LOCAL MEDICATIONS USED:  LIDOCAINE   SPECIMEN:  Biopsy / Limited Resection  DISPOSITION OF SPECIMEN:  PATHOLOGY  COUNTS:  YES  TOURNIQUET:  * No tourniquets in log *  DICTATION: .Dragon Dictation  PLAN OF CARE: Discharge to home after PACU  PATIENT DISPOSITION:  PACU - hemodynamically stable.   Delay start of Pharmacological VTE agent (>24hrs) due to surgical blood loss or risk of bleeding: not applicable

## 2020-02-26 NOTE — Anesthesia Procedure Notes (Signed)
Procedure Name: MAC Date/Time: 02/26/2020 7:29 AM Performed by: Scheryl Darter, CRNA Pre-anesthesia Checklist: Patient identified, Emergency Drugs available, Suction available, Patient being monitored and Timeout performed Patient Re-evaluated:Patient Re-evaluated prior to induction Oxygen Delivery Method: Nasal cannula Placement Confirmation: positive ETCO2

## 2020-02-26 NOTE — Op Note (Signed)
Patient was taken to the operating suite and after induction of adequate IV sedation I injected 6 cc of 1% lidocaine with epinephrine 1-100,000 bicarb solution into the flexor sheath of the right index finger over the P1 and P2 segments.  Patient was then prepped and draped in the usual sterile fashion.  We then made a fishmouth incision starting mid lateral on both the radial ulnar sides going dorsal and volar with a large volar flap and dissected down to the distal interphalangeal joint.  We carefully disarticulated the joint and performed bilateral digital nerve neurectomies.  We removed the head of the middle phalanx and used a rongeured to get down to a level of viable bone.  We then thoroughly irrigated and advanced the volar flap to close using 4-0 Monocryl sutures interrupted.  We then dressed with Xeroform, 4 x 4's, and a compression bandage.  Patient tolerated this procedure well went to recovery in stable fashion.

## 2020-02-26 NOTE — Anesthesia Postprocedure Evaluation (Signed)
Anesthesia Post Note  Patient: [name]  Procedure(s) Performed: Right interphalanx amputation (Right )     Patient location during evaluation: PACU Anesthesia Type: MAC Level of consciousness: awake and alert Pain management: pain level controlled Vital Signs Assessment: post-procedure vital signs reviewed and stable Respiratory status: spontaneous breathing, nonlabored ventilation and respiratory function stable Cardiovascular status: blood pressure returned to baseline and stable Postop Assessment: no apparent nausea or vomiting Anesthetic complications: no    Last Vitals:  Vitals:   02/26/20 0900 02/26/20 0910  BP:  (!) 195/60  Pulse: 69 68  Resp: 14 14  Temp:    SpO2: 100% 99%    Last Pain:  Vitals:   02/26/20 0845  TempSrc:   PainSc: 0-No pain                 Lidia Collum

## 2020-02-27 LAB — SURGICAL PATHOLOGY

## 2020-03-02 ENCOUNTER — Ambulatory Visit: Payer: Medicare Other | Admitting: Pharmacist

## 2020-03-03 ENCOUNTER — Other Ambulatory Visit: Payer: Self-pay

## 2020-03-03 ENCOUNTER — Emergency Department (HOSPITAL_COMMUNITY): Payer: Medicare Other

## 2020-03-03 ENCOUNTER — Emergency Department (HOSPITAL_COMMUNITY)
Admission: EM | Admit: 2020-03-03 | Discharge: 2020-03-03 | Disposition: A | Discharge status: Home / Self Care | Payer: Medicare Other | Attending: Emergency Medicine | Admitting: Emergency Medicine

## 2020-03-03 ENCOUNTER — Encounter (HOSPITAL_COMMUNITY): Payer: Self-pay | Admitting: Emergency Medicine

## 2020-03-03 DIAGNOSIS — Z89512 Acquired absence of left leg below knee: Secondary | ICD-10-CM | POA: Insufficient documentation

## 2020-03-03 DIAGNOSIS — I120 Hypertensive chronic kidney disease with stage 5 chronic kidney disease or end stage renal disease: Secondary | ICD-10-CM | POA: Diagnosis not present

## 2020-03-03 DIAGNOSIS — Z87891 Personal history of nicotine dependence: Secondary | ICD-10-CM | POA: Insufficient documentation

## 2020-03-03 DIAGNOSIS — Z794 Long term (current) use of insulin: Secondary | ICD-10-CM | POA: Insufficient documentation

## 2020-03-03 DIAGNOSIS — R112 Nausea with vomiting, unspecified: Secondary | ICD-10-CM | POA: Diagnosis present

## 2020-03-03 DIAGNOSIS — Z89511 Acquired absence of right leg below knee: Secondary | ICD-10-CM | POA: Diagnosis not present

## 2020-03-03 DIAGNOSIS — R110 Nausea: Secondary | ICD-10-CM

## 2020-03-03 DIAGNOSIS — R5381 Other malaise: Secondary | ICD-10-CM | POA: Diagnosis not present

## 2020-03-03 DIAGNOSIS — Z5189 Encounter for other specified aftercare: Secondary | ICD-10-CM

## 2020-03-03 DIAGNOSIS — N186 End stage renal disease: Secondary | ICD-10-CM | POA: Insufficient documentation

## 2020-03-03 DIAGNOSIS — I739 Peripheral vascular disease, unspecified: Secondary | ICD-10-CM | POA: Insufficient documentation

## 2020-03-03 DIAGNOSIS — E1122 Type 2 diabetes mellitus with diabetic chronic kidney disease: Secondary | ICD-10-CM | POA: Insufficient documentation

## 2020-03-03 DIAGNOSIS — Z992 Dependence on renal dialysis: Secondary | ICD-10-CM | POA: Insufficient documentation

## 2020-03-03 DIAGNOSIS — B182 Chronic viral hepatitis C: Secondary | ICD-10-CM | POA: Diagnosis not present

## 2020-03-03 LAB — COMPREHENSIVE METABOLIC PANEL
ALT: 91 U/L — ABNORMAL HIGH (ref 0–44)
AST: 164 U/L — ABNORMAL HIGH (ref 15–41)
Albumin: 3.1 g/dL — ABNORMAL LOW (ref 3.5–5.0)
Alkaline Phosphatase: 66 U/L (ref 38–126)
Anion gap: 32 — ABNORMAL HIGH (ref 5–15)
BUN: 71 mg/dL — ABNORMAL HIGH (ref 8–23)
CO2: 15 mmol/L — ABNORMAL LOW (ref 22–32)
Calcium: 7.5 mg/dL — ABNORMAL LOW (ref 8.9–10.3)
Chloride: 90 mmol/L — ABNORMAL LOW (ref 98–111)
Creatinine, Ser: 10.87 mg/dL — ABNORMAL HIGH (ref 0.61–1.24)
GFR calc Af Amer: 5 mL/min — ABNORMAL LOW (ref >60)
GFR calc non Af Amer: 5 mL/min — ABNORMAL LOW (ref >60)
Glucose, Bld: 140 mg/dL — ABNORMAL HIGH (ref 70–99)
Potassium: 4.8 mmol/L (ref 3.5–5.1)
Sodium: 137 mmol/L (ref 135–145)
Total Bilirubin: 1 mg/dL (ref 0.3–1.2)
Total Protein: 7.7 g/dL (ref 6.5–8.1)

## 2020-03-03 LAB — LACTIC ACID, PLASMA: Lactic Acid, Venous: 1.4 mmol/L (ref 0.5–1.9)

## 2020-03-03 LAB — CBC
HCT: 30.2 % — ABNORMAL LOW (ref 39.0–52.0)
Hemoglobin: 9.4 g/dL — ABNORMAL LOW (ref 13.0–17.0)
MCH: 24.9 pg — ABNORMAL LOW (ref 26.0–34.0)
MCHC: 31.1 g/dL (ref 30.0–36.0)
MCV: 80.1 fL (ref 80.0–100.0)
Platelets: 266 10*3/uL (ref 150–400)
RBC: 3.77 MIL/uL — ABNORMAL LOW (ref 4.22–5.81)
RDW: 18.2 % — ABNORMAL HIGH (ref 11.5–15.5)
WBC: 11.2 10*3/uL — ABNORMAL HIGH (ref 4.0–10.5)
nRBC: 0 % (ref 0.0–0.2)

## 2020-03-03 LAB — LIPASE, BLOOD: Lipase: 29 U/L (ref 11–51)

## 2020-03-03 MED ORDER — ONDANSETRON HCL 4 MG/2ML IJ SOLN
4.0000 mg | Freq: Once | INTRAMUSCULAR | Status: AC
  Administered 2020-03-03: 4 mg via INTRAVENOUS
  Filled 2020-03-03: qty 2

## 2020-03-03 MED ORDER — ONDANSETRON 4 MG PO TBDP: ORAL_TABLET | ORAL | 0 refills | Status: DC

## 2020-03-03 MED ORDER — ALUM & MAG HYDROXIDE-SIMETH 200-200-20 MG/5ML PO SUSP
30.0000 mL | Freq: Once | ORAL | Status: AC
  Administered 2020-03-03: 30 mL via ORAL
  Filled 2020-03-03: qty 30

## 2020-03-03 MED ORDER — LIDOCAINE VISCOUS HCL 2 % MT SOLN
15.0000 mL | Freq: Once | OROMUCOSAL | Status: AC
  Administered 2020-03-03: 15 mL via ORAL
  Filled 2020-03-03: qty 15

## 2020-03-03 MED ORDER — PROMETHAZINE HCL 25 MG/ML IJ SOLN
12.5000 mg | Freq: Once | INTRAMUSCULAR | Status: AC
  Administered 2020-03-03: 14:00:00 12.5 mg via INTRAVENOUS
  Filled 2020-03-03: qty 1

## 2020-03-03 MED ORDER — OXYCODONE-ACETAMINOPHEN 5-325 MG PO TABS
1.0000 | ORAL_TABLET | Freq: Once | ORAL | Status: AC
  Administered 2020-03-03: 1 via ORAL
  Filled 2020-03-03: qty 1

## 2020-03-03 NOTE — ED Notes (Signed)
Patient verbalizes understanding of discharge instructions. Opportunity for questioning and answers were provided. Armband removed by staff, pt discharged from ED ambulatory.   
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
## 2020-03-03 NOTE — Consult Note (Signed)
WOC Nurse Consult Note: Patient receiving care in [facility] ED 41. Reason for Consult: left stump wound Wound type: fissure Pressure Injury POA: Yes/No/NA Measurement: see photo of area Wound bed: pink Drainage (amount, consistency, odor) none Periwound: darkened, but intact Dressing procedure/placement/frequency:  Wash fissure on left stump with soap and water. Pat dry. Apply a small foam dressing. Change every 3 days and prn. Monitor the wound area(s) for worsening of condition such as: Signs/symptoms of infection,  Increase in size,  Development of or worsening of odor, Development of pain, or increased pain at the affected locations.  Notify the medical team if any of these develop.  Thank you for the consult.  [facility] nurse will not follow at this time.  Please re-consult the [facility] team if needed.  Val Riles, RN, MSN, CWOCN, CNS-BC, pager [phone]

## 2020-03-03 NOTE — ED Provider Notes (Signed)
  Face-to-face evaluation   History: He presents for evaluation of malaise and ongoing nausea for several days.  He skipped dialysis today because he was not feeling well.  He recently had right index fingertip removed due to chronic ulcer, ischemia and peripheral vascular disease.  Physical exam: Alert, cooperative, lucid.  Alert, right hand somewhat swollen, bandage on right index finger wound.  Bilateral BKA's, small open area on left stump region, no drainage or bleeding at the site.  Normal pulse, left upper arm vascular fistula.   Medical screening examination/treatment/procedure(s) were conducted as a shared visit with non-physician practitioner(s) and myself.  I personally evaluated the patient during the encounter    Daleen Bo, MD 03/04/20 [phone]

## 2020-03-03 NOTE — ED Triage Notes (Signed)
Pt here with c/o a wound to his left stump that has been ongoing for a few days , pt missed dialysis today had his finger looked at yesterday by the surgeon which said it looked fine

## 2020-03-03 NOTE — Discharge Instructions (Addendum)
Call your dialysis center to get set up for dialysis tomorrow.  Your fingertip looks like it is healing well continue to elevate the finger and follow-up with Dr. Burney Gauze.  The wound on the end of your left stump does not appear infected today, apply foam dressing every 3 days and follow-up with Dr. Sharol Given.  Monitor for redness, swelling, warmth or white-yellow drainage.

## 2020-03-09 ENCOUNTER — Other Ambulatory Visit: Payer: Self-pay

## 2020-03-09 ENCOUNTER — Inpatient Hospital Stay (HOSPITAL_COMMUNITY)
Admission: EM | Admit: 2020-03-09 | Discharge: 2020-03-16 | DRG: 853 | Disposition: A | Discharge status: Home / Self Care | Payer: Medicare Other | Attending: Internal Medicine | Admitting: Internal Medicine

## 2020-03-09 ENCOUNTER — Emergency Department (HOSPITAL_COMMUNITY): Payer: Medicare Other

## 2020-03-09 ENCOUNTER — Ambulatory Visit: Payer: Medicare Other | Admitting: Orthopedic Surgery

## 2020-03-09 DIAGNOSIS — G9341 Metabolic encephalopathy: Secondary | ICD-10-CM | POA: Diagnosis present

## 2020-03-09 DIAGNOSIS — B961 Klebsiella pneumoniae [K. pneumoniae] as the cause of diseases classified elsewhere: Secondary | ICD-10-CM | POA: Diagnosis present

## 2020-03-09 DIAGNOSIS — M869 Osteomyelitis, unspecified: Secondary | ICD-10-CM | POA: Diagnosis present

## 2020-03-09 DIAGNOSIS — L089 Local infection of the skin and subcutaneous tissue, unspecified: Secondary | ICD-10-CM | POA: Diagnosis present

## 2020-03-09 DIAGNOSIS — Z20822 Contact with and (suspected) exposure to covid-19: Secondary | ICD-10-CM | POA: Diagnosis present

## 2020-03-09 DIAGNOSIS — R4781 Slurred speech: Secondary | ICD-10-CM | POA: Diagnosis present

## 2020-03-09 DIAGNOSIS — B1920 Unspecified viral hepatitis C without hepatic coma: Secondary | ICD-10-CM | POA: Diagnosis present

## 2020-03-09 DIAGNOSIS — Z8249 Family history of ischemic heart disease and other diseases of the circulatory system: Secondary | ICD-10-CM

## 2020-03-09 DIAGNOSIS — R7989 Other specified abnormal findings of blood chemistry: Secondary | ICD-10-CM | POA: Diagnosis not present

## 2020-03-09 DIAGNOSIS — Z992 Dependence on renal dialysis: Secondary | ICD-10-CM

## 2020-03-09 DIAGNOSIS — M79641 Pain in right hand: Secondary | ICD-10-CM | POA: Diagnosis not present

## 2020-03-09 DIAGNOSIS — D631 Anemia in chronic kidney disease: Secondary | ICD-10-CM | POA: Diagnosis present

## 2020-03-09 DIAGNOSIS — E119 Type 2 diabetes mellitus without complications: Secondary | ICD-10-CM

## 2020-03-09 DIAGNOSIS — R509 Fever, unspecified: Secondary | ICD-10-CM

## 2020-03-09 DIAGNOSIS — A419 Sepsis, unspecified organism: Secondary | ICD-10-CM | POA: Diagnosis not present

## 2020-03-09 DIAGNOSIS — E1122 Type 2 diabetes mellitus with diabetic chronic kidney disease: Secondary | ICD-10-CM | POA: Diagnosis present

## 2020-03-09 DIAGNOSIS — Z89512 Acquired absence of left leg below knee: Secondary | ICD-10-CM

## 2020-03-09 DIAGNOSIS — I120 Hypertensive chronic kidney disease with stage 5 chronic kidney disease or end stage renal disease: Secondary | ICD-10-CM | POA: Diagnosis present

## 2020-03-09 DIAGNOSIS — N186 End stage renal disease: Secondary | ICD-10-CM | POA: Diagnosis present

## 2020-03-09 DIAGNOSIS — K219 Gastro-esophageal reflux disease without esophagitis: Secondary | ICD-10-CM | POA: Diagnosis present

## 2020-03-09 DIAGNOSIS — N2581 Secondary hyperparathyroidism of renal origin: Secondary | ICD-10-CM | POA: Diagnosis present

## 2020-03-09 DIAGNOSIS — L98499 Non-pressure chronic ulcer of skin of other sites with unspecified severity: Secondary | ICD-10-CM | POA: Diagnosis present

## 2020-03-09 DIAGNOSIS — Z765 Malingerer [conscious simulation]: Secondary | ICD-10-CM

## 2020-03-09 DIAGNOSIS — Z79899 Other long term (current) drug therapy: Secondary | ICD-10-CM

## 2020-03-09 DIAGNOSIS — E1140 Type 2 diabetes mellitus with diabetic neuropathy, unspecified: Secondary | ICD-10-CM | POA: Diagnosis present

## 2020-03-09 DIAGNOSIS — Z87891 Personal history of nicotine dependence: Secondary | ICD-10-CM

## 2020-03-09 DIAGNOSIS — M79644 Pain in right finger(s): Secondary | ICD-10-CM | POA: Diagnosis present

## 2020-03-09 DIAGNOSIS — R197 Diarrhea, unspecified: Secondary | ICD-10-CM | POA: Diagnosis present

## 2020-03-09 DIAGNOSIS — B182 Chronic viral hepatitis C: Secondary | ICD-10-CM | POA: Diagnosis present

## 2020-03-09 DIAGNOSIS — I10 Essential (primary) hypertension: Secondary | ICD-10-CM | POA: Diagnosis present

## 2020-03-09 DIAGNOSIS — Z833 Family history of diabetes mellitus: Secondary | ICD-10-CM

## 2020-03-09 DIAGNOSIS — E8889 Other specified metabolic disorders: Secondary | ICD-10-CM | POA: Diagnosis present

## 2020-03-09 DIAGNOSIS — Z89021 Acquired absence of right finger(s): Secondary | ICD-10-CM

## 2020-03-09 DIAGNOSIS — Z89511 Acquired absence of right leg below knee: Secondary | ICD-10-CM

## 2020-03-09 DIAGNOSIS — E1152 Type 2 diabetes mellitus with diabetic peripheral angiopathy with gangrene: Secondary | ICD-10-CM | POA: Diagnosis present

## 2020-03-09 DIAGNOSIS — Z794 Long term (current) use of insulin: Secondary | ICD-10-CM

## 2020-03-09 LAB — COMPREHENSIVE METABOLIC PANEL
ALT: 169 U/L — ABNORMAL HIGH (ref 0–44)
AST: 107 U/L — ABNORMAL HIGH (ref 15–41)
Albumin: 3.3 g/dL — ABNORMAL LOW (ref 3.5–5.0)
Alkaline Phosphatase: 93 U/L (ref 38–126)
Anion gap: 22 — ABNORMAL HIGH (ref 5–15)
BUN: 67 mg/dL — ABNORMAL HIGH (ref 8–23)
CO2: 22 mmol/L (ref 22–32)
Calcium: 7.8 mg/dL — ABNORMAL LOW (ref 8.9–10.3)
Chloride: 93 mmol/L — ABNORMAL LOW (ref 98–111)
Creatinine, Ser: 12.46 mg/dL — ABNORMAL HIGH (ref 0.61–1.24)
GFR calc Af Amer: 4 mL/min — ABNORMAL LOW (ref >60)
GFR calc non Af Amer: 4 mL/min — ABNORMAL LOW (ref >60)
Glucose, Bld: 96 mg/dL (ref 70–99)
Potassium: 3.8 mmol/L (ref 3.5–5.1)
Sodium: 137 mmol/L (ref 135–145)
Total Bilirubin: 1 mg/dL (ref 0.3–1.2)
Total Protein: 8.1 g/dL (ref 6.5–8.1)

## 2020-03-09 LAB — CBC
HCT: 26.9 % — ABNORMAL LOW (ref 39.0–52.0)
Hemoglobin: 8.8 g/dL — ABNORMAL LOW (ref 13.0–17.0)
MCH: 24.9 pg — ABNORMAL LOW (ref 26.0–34.0)
MCHC: 32.7 g/dL (ref 30.0–36.0)
MCV: 76 fL — ABNORMAL LOW (ref 80.0–100.0)
Platelets: 280 10*3/uL (ref 150–400)
RBC: 3.54 MIL/uL — ABNORMAL LOW (ref 4.22–5.81)
RDW: 18 % — ABNORMAL HIGH (ref 11.5–15.5)
WBC: 13.1 10*3/uL — ABNORMAL HIGH (ref 4.0–10.5)
nRBC: 0.2 % (ref 0.0–0.2)

## 2020-03-09 LAB — MAGNESIUM: Magnesium: 2.4 mg/dL (ref 1.7–2.4)

## 2020-03-09 LAB — CBG MONITORING, ED: Glucose-Capillary: 94 mg/dL (ref 70–99)

## 2020-03-09 LAB — APTT: aPTT: 38 seconds — ABNORMAL HIGH (ref 24–36)

## 2020-03-09 LAB — PROTIME-INR
INR: 1.2 (ref 0.8–1.2)
Prothrombin Time: 15 seconds (ref 11.4–15.2)

## 2020-03-09 MED ORDER — ACETAMINOPHEN 325 MG PO TABS: 650.0000 mg | ORAL_TABLET | Freq: Once | ORAL | Status: DC

## 2020-03-09 MED ORDER — ACETAMINOPHEN 650 MG RE SUPP
650.0000 mg | Freq: Once | RECTAL | Status: AC
  Administered 2020-03-09: 650 mg via RECTAL
  Filled 2020-03-09: qty 1

## 2020-03-09 NOTE — ED Notes (Signed)
Wife Renea Ee updated in regards to pt's care. Can be reached at [phone]

## 2020-03-09 NOTE — ED Notes (Signed)
Pt transported to CT ?

## 2020-03-09 NOTE — ED Provider Notes (Signed)
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 could represent developing osteomyelitis.  Lucency is also noticed on my interpretation of x-ray.  On exam, there is concern for gangrenous changes on the index finger.  Suspect altered mental status is secondary to sepsis.  He is critically ill and will require admission for further work-up and evaluation.  I suspect source of infection is the patient's right hand.  Consulted hand surgery and Dr. Fredna Dow will inform Dr. Burney Gauze that the patient has been admitted to the hospital.  Consult to the hospitalist team and Dr. Marlowe Sax will admit.  On her initial evaluation, there was concern that patient was not as responsive to sternal rub.  After reviewing the patient's chart, he was recently started on Percocet for right hand pain.  The patient's wife is unsure of how he has been taking the medication.  Will order Narcan to see if there is improvement in mental status.  Following Narcan administration, somnolence has significantly improved.  The patient appears reasonably stabilized for  admission considering the current resources, flow, and capabilities available in the ED at this time, and I doubt any other [facility] requiring further screening and/or treatment in the ED prior to admission.  Final Clinical Impression(s) / ED Diagnoses Final diagnoses:  Sepsis without acute organ dysfunction, due to unspecified organism [facility])  Elevated LFTs    Rx / DC Orders ED Discharge Orders    None       Curren Mohrmann A, PA-C 03/10/20 0802    Varney Biles, MD 03/10/20 [phone]

## 2020-03-09 NOTE — ED Triage Notes (Signed)
Pt BIB [facility] from home c/o AMS. Per EMS report wife states worsening AMS, lethargic, and weak x2 weeks. With vomiting, diarrhea, and decreased appetie x3 days   Normally A&Ox4 ambulatory with walker. Today nonambulatory and on arrival pt A&Ox2 self and place. Was seen in ED for similar complaint 1 week ago.   Presents to ED with right pointer finger injury and painful. Speech slurred. HX DM, HTN, bilateral leg amputations and dialysis, uncertain of last dialysis.   EMS VS T 102 HR 80s BP 165/90 CBG 105

## 2020-03-10 ENCOUNTER — Other Ambulatory Visit: Payer: Self-pay | Admitting: Orthopedic Surgery

## 2020-03-10 ENCOUNTER — Emergency Department (HOSPITAL_COMMUNITY): Payer: Medicare Other

## 2020-03-10 ENCOUNTER — Other Ambulatory Visit: Payer: Self-pay

## 2020-03-10 ENCOUNTER — Inpatient Hospital Stay (HOSPITAL_COMMUNITY): Payer: Medicare Other

## 2020-03-10 DIAGNOSIS — B961 Klebsiella pneumoniae [K. pneumoniae] as the cause of diseases classified elsewhere: Secondary | ICD-10-CM | POA: Diagnosis present

## 2020-03-10 DIAGNOSIS — A419 Sepsis, unspecified organism: Secondary | ICD-10-CM | POA: Diagnosis present

## 2020-03-10 DIAGNOSIS — K219 Gastro-esophageal reflux disease without esophagitis: Secondary | ICD-10-CM | POA: Diagnosis present

## 2020-03-10 DIAGNOSIS — Z992 Dependence on renal dialysis: Secondary | ICD-10-CM | POA: Diagnosis not present

## 2020-03-10 DIAGNOSIS — R509 Fever, unspecified: Secondary | ICD-10-CM | POA: Diagnosis not present

## 2020-03-10 DIAGNOSIS — M79641 Pain in right hand: Secondary | ICD-10-CM | POA: Diagnosis not present

## 2020-03-10 DIAGNOSIS — E1122 Type 2 diabetes mellitus with diabetic chronic kidney disease: Secondary | ICD-10-CM | POA: Diagnosis present

## 2020-03-10 DIAGNOSIS — L089 Local infection of the skin and subcutaneous tissue, unspecified: Secondary | ICD-10-CM | POA: Diagnosis present

## 2020-03-10 DIAGNOSIS — N186 End stage renal disease: Secondary | ICD-10-CM

## 2020-03-10 DIAGNOSIS — B182 Chronic viral hepatitis C: Secondary | ICD-10-CM | POA: Diagnosis present

## 2020-03-10 DIAGNOSIS — D631 Anemia in chronic kidney disease: Secondary | ICD-10-CM | POA: Diagnosis present

## 2020-03-10 DIAGNOSIS — E1152 Type 2 diabetes mellitus with diabetic peripheral angiopathy with gangrene: Secondary | ICD-10-CM | POA: Diagnosis present

## 2020-03-10 DIAGNOSIS — R7989 Other specified abnormal findings of blood chemistry: Secondary | ICD-10-CM | POA: Diagnosis present

## 2020-03-10 DIAGNOSIS — G9341 Metabolic encephalopathy: Secondary | ICD-10-CM | POA: Diagnosis present

## 2020-03-10 DIAGNOSIS — B1920 Unspecified viral hepatitis C without hepatic coma: Secondary | ICD-10-CM | POA: Diagnosis present

## 2020-03-10 DIAGNOSIS — Z20822 Contact with and (suspected) exposure to covid-19: Secondary | ICD-10-CM | POA: Diagnosis present

## 2020-03-10 DIAGNOSIS — R4781 Slurred speech: Secondary | ICD-10-CM | POA: Diagnosis present

## 2020-03-10 DIAGNOSIS — M79644 Pain in right finger(s): Secondary | ICD-10-CM | POA: Diagnosis present

## 2020-03-10 DIAGNOSIS — I120 Hypertensive chronic kidney disease with stage 5 chronic kidney disease or end stage renal disease: Secondary | ICD-10-CM | POA: Diagnosis present

## 2020-03-10 DIAGNOSIS — Z89512 Acquired absence of left leg below knee: Secondary | ICD-10-CM | POA: Diagnosis not present

## 2020-03-10 DIAGNOSIS — R197 Diarrhea, unspecified: Secondary | ICD-10-CM | POA: Diagnosis present

## 2020-03-10 DIAGNOSIS — E1140 Type 2 diabetes mellitus with diabetic neuropathy, unspecified: Secondary | ICD-10-CM | POA: Diagnosis present

## 2020-03-10 DIAGNOSIS — L98499 Non-pressure chronic ulcer of skin of other sites with unspecified severity: Secondary | ICD-10-CM | POA: Diagnosis present

## 2020-03-10 DIAGNOSIS — E8889 Other specified metabolic disorders: Secondary | ICD-10-CM | POA: Diagnosis present

## 2020-03-10 DIAGNOSIS — N2581 Secondary hyperparathyroidism of renal origin: Secondary | ICD-10-CM | POA: Diagnosis present

## 2020-03-10 DIAGNOSIS — M869 Osteomyelitis, unspecified: Secondary | ICD-10-CM | POA: Diagnosis present

## 2020-03-10 LAB — CBG MONITORING, ED
Glucose-Capillary: 163 mg/dL — ABNORMAL HIGH (ref 70–99)
Glucose-Capillary: 47 mg/dL — ABNORMAL LOW (ref 70–99)
Glucose-Capillary: 48 mg/dL — ABNORMAL LOW (ref 70–99)
Glucose-Capillary: 92 mg/dL (ref 70–99)
Glucose-Capillary: 161 mg/dL — ABNORMAL HIGH (ref 70–99)
Glucose-Capillary: 92 mg/dL (ref 70–99)

## 2020-03-10 LAB — TSH: TSH: 2.68 u[IU]/mL (ref 0.350–4.500)

## 2020-03-10 LAB — POCT I-STAT 7, (LYTES, BLD GAS, ICA,H+H)
Acid-base deficit: 2 mmol/L (ref 0.0–2.0)
Bicarbonate: 22.4 mmol/L (ref 20.0–28.0)
Calcium, Ion: 0.97 mmol/L — ABNORMAL LOW (ref 1.15–1.40)
HCT: 29 % — ABNORMAL LOW (ref 39.0–52.0)
Hemoglobin: 9.9 g/dL — ABNORMAL LOW (ref 13.0–17.0)
O2 Saturation: 95 %
Patient temperature: 98
Potassium: 3.8 mmol/L (ref 3.5–5.1)
Sodium: 136 mmol/L (ref 135–145)
TCO2: 24 mmol/L (ref 22–32)
pCO2 arterial: 35.3 mmHg (ref 32.0–48.0)
pH, Arterial: 7.41 (ref 7.350–7.450)
pO2, Arterial: 73 mmHg — ABNORMAL LOW (ref 83.0–108.0)

## 2020-03-10 LAB — AMMONIA: Ammonia: 43 umol/L — ABNORMAL HIGH (ref 9–35)

## 2020-03-10 LAB — VITAMIN B12: Vitamin B-12: 3251 pg/mL — ABNORMAL HIGH (ref 180–914)

## 2020-03-10 LAB — HIV ANTIBODY (ROUTINE TESTING W REFLEX): HIV Screen 4th Generation wRfx: NONREACTIVE

## 2020-03-10 LAB — SEDIMENTATION RATE: Sed Rate: 105 mm/hr — ABNORMAL HIGH (ref 0–16)

## 2020-03-10 LAB — LACTIC ACID, PLASMA
Lactic Acid, Venous: 1.2 mmol/L (ref 0.5–1.9)
Lactic Acid, Venous: 1.4 mmol/L (ref 0.5–1.9)

## 2020-03-10 LAB — ETHANOL: Alcohol, Ethyl (B): <10 mg/dL (ref <10)

## 2020-03-10 LAB — C-REACTIVE PROTEIN: CRP: 3.7 mg/dL — ABNORMAL HIGH (ref <1.0)

## 2020-03-10 LAB — SARS CORONAVIRUS 2 (TAT 6-24 HRS): SARS Coronavirus 2: NEGATIVE

## 2020-03-10 LAB — GLUCOSE, CAPILLARY: Glucose-Capillary: 117 mg/dL — ABNORMAL HIGH (ref 70–99)

## 2020-03-10 MED ORDER — SODIUM CHLORIDE 0.9 % IV SOLN: 2.0000 g | INTRAVENOUS | Status: DC

## 2020-03-10 MED ORDER — HEPARIN SODIUM (PORCINE) 5000 UNIT/ML IJ SOLN
5000.0000 [IU] | Freq: Three times a day (TID) | INTRAMUSCULAR | Status: DC
  Administered 2020-03-10 – 2020-03-16 (×14): 5000 [IU] via SUBCUTANEOUS
  Filled 2020-03-10 (×13): qty 1

## 2020-03-10 MED ORDER — CARVEDILOL 12.5 MG PO TABS
12.5000 mg | ORAL_TABLET | Freq: Two times a day (BID) | ORAL | Status: DC
  Administered 2020-03-10 – 2020-03-14 (×5): 12.5 mg via ORAL
  Filled 2020-03-10 (×5): qty 1

## 2020-03-10 MED ORDER — CHLORHEXIDINE GLUCONATE CLOTH 2 % EX PADS
6.0000 | MEDICATED_PAD | Freq: Every day | CUTANEOUS | Status: DC
  Administered 2020-03-11 – 2020-03-15 (×4): 6 via TOPICAL

## 2020-03-10 MED ORDER — SODIUM CHLORIDE 0.9 % IV SOLN: 100.0000 mL | INTRAVENOUS | Status: DC | PRN

## 2020-03-10 MED ORDER — METRONIDAZOLE IN NACL 5-0.79 MG/ML-% IV SOLN
500.0000 mg | Freq: Three times a day (TID) | INTRAVENOUS | Status: DC
  Administered 2020-03-10 – 2020-03-11 (×4): 500 mg via INTRAVENOUS
  Filled 2020-03-10 (×4): qty 100

## 2020-03-10 MED ORDER — NALOXONE HCL 0.4 MG/ML IJ SOLN
0.4000 mg | Freq: Once | INTRAMUSCULAR | Status: AC
  Administered 2020-03-10: 0.4 mg via INTRAVENOUS
  Filled 2020-03-10: qty 1

## 2020-03-10 MED ORDER — GABAPENTIN 100 MG PO CAPS
100.0000 mg | ORAL_CAPSULE | Freq: Every day | ORAL | Status: DC
  Administered 2020-03-10 – 2020-03-13 (×4): 100 mg via ORAL
  Filled 2020-03-10 (×4): qty 1

## 2020-03-10 MED ORDER — LIDOCAINE-PRILOCAINE 2.5-2.5 % EX CREA
1.0000 "application " | TOPICAL_CREAM | CUTANEOUS | Status: DC | PRN
  Filled 2020-03-10: qty 5

## 2020-03-10 MED ORDER — HEPARIN SODIUM (PORCINE) 1000 UNIT/ML DIALYSIS
4600.0000 [IU] | Freq: Once | INTRAMUSCULAR | Status: DC
  Filled 2020-03-10: qty 5

## 2020-03-10 MED ORDER — ALTEPLASE 2 MG IJ SOLR: 2.0000 mg | Freq: Once | INTRAMUSCULAR | Status: DC | PRN

## 2020-03-10 MED ORDER — ACETAMINOPHEN 325 MG PO TABS
650.0000 mg | ORAL_TABLET | Freq: Four times a day (QID) | ORAL | Status: DC | PRN
  Administered 2020-03-10 – 2020-03-15 (×4): 650 mg via ORAL
  Filled 2020-03-10 (×4): qty 2

## 2020-03-10 MED ORDER — ACETAMINOPHEN 650 MG RE SUPP: 650.0000 mg | Freq: Four times a day (QID) | RECTAL | Status: DC | PRN

## 2020-03-10 MED ORDER — DEXTROSE 50 % IV SOLN
INTRAVENOUS | Status: AC
  Filled 2020-03-10: qty 50

## 2020-03-10 MED ORDER — DEXTROSE 50 % IV SOLN: 50.0000 mL | Freq: Once | INTRAVENOUS | Status: DC

## 2020-03-10 MED ORDER — DEXTROSE 50 % IV SOLN: 25.0000 mL | Freq: Once | INTRAVENOUS | Status: DC

## 2020-03-10 MED ORDER — LIDOCAINE HCL (PF) 1 % IJ SOLN
5.0000 mL | INTRAMUSCULAR | Status: DC | PRN
  Filled 2020-03-10: qty 5

## 2020-03-10 MED ORDER — SEVELAMER CARBONATE 800 MG PO TABS
2400.0000 mg | ORAL_TABLET | Freq: Three times a day (TID) | ORAL | Status: DC
  Administered 2020-03-11 – 2020-03-16 (×11): 2400 mg via ORAL
  Filled 2020-03-10 (×12): qty 3

## 2020-03-10 MED ORDER — DARBEPOETIN ALFA 100 MCG/0.5ML IJ SOSY
100.0000 ug | PREFILLED_SYRINGE | INTRAMUSCULAR | Status: DC
  Filled 2020-03-10: qty 0.5

## 2020-03-10 MED ORDER — VANCOMYCIN HCL 2000 MG/400ML IV SOLN
2000.0000 mg | INTRAVENOUS | Status: AC
  Administered 2020-03-10: 2000 mg via INTRAVENOUS
  Filled 2020-03-10: qty 400

## 2020-03-10 MED ORDER — HEPARIN SODIUM (PORCINE) 1000 UNIT/ML DIALYSIS
1000.0000 [IU] | INTRAMUSCULAR | Status: DC | PRN
  Filled 2020-03-10: qty 1

## 2020-03-10 MED ORDER — SODIUM CHLORIDE 0.9 % IV SOLN
2.0000 g | INTRAVENOUS | Status: DC
  Filled 2020-03-10: qty 2

## 2020-03-10 MED ORDER — METRONIDAZOLE IN NACL 5-0.79 MG/ML-% IV SOLN
500.0000 mg | Freq: Once | INTRAVENOUS | Status: AC
  Administered 2020-03-10: 500 mg via INTRAVENOUS
  Filled 2020-03-10: qty 100

## 2020-03-10 MED ORDER — SODIUM CHLORIDE 0.9 % IV SOLN
2.0000 g | INTRAVENOUS | Status: AC
  Administered 2020-03-10: 2 g via INTRAVENOUS
  Filled 2020-03-10: qty 2

## 2020-03-10 MED ORDER — DEXTROSE 50 % IV SOLN
25.0000 g | Freq: Once | INTRAVENOUS | Status: AC
  Administered 2020-03-10: 25 g via INTRAVENOUS

## 2020-03-10 MED ORDER — OXYCODONE-ACETAMINOPHEN 5-325 MG PO TABS
1.0000 | ORAL_TABLET | Freq: Four times a day (QID) | ORAL | Status: DC | PRN
  Administered 2020-03-10 – 2020-03-11 (×2): 1 via ORAL
  Filled 2020-03-10 (×2): qty 1

## 2020-03-10 MED ORDER — HYDRALAZINE HCL 20 MG/ML IJ SOLN
5.0000 mg | INTRAMUSCULAR | Status: DC | PRN
  Administered 2020-03-10 – 2020-03-14 (×2): 5 mg via INTRAVENOUS
  Filled 2020-03-10 (×2): qty 1

## 2020-03-10 MED ORDER — INSULIN ASPART 100 UNIT/ML ~~LOC~~ SOLN
0.0000 [IU] | Freq: Three times a day (TID) | SUBCUTANEOUS | Status: DC
  Administered 2020-03-11 – 2020-03-16 (×7): 1 [IU] via SUBCUTANEOUS

## 2020-03-10 MED ORDER — PENTAFLUOROPROP-TETRAFLUOROETH EX AERO
1.0000 "application " | INHALATION_SPRAY | CUTANEOUS | Status: DC | PRN
  Filled 2020-03-10: qty 116

## 2020-03-10 MED ORDER — INSULIN ASPART 100 UNIT/ML ~~LOC~~ SOLN: 0.0000 [IU] | Freq: Every day | SUBCUTANEOUS | Status: DC

## 2020-03-10 MED ORDER — VANCOMYCIN HCL IN DEXTROSE 1-5 GM/200ML-% IV SOLN
1000.0000 mg | INTRAVENOUS | Status: DC
  Filled 2020-03-10 (×2): qty 200

## 2020-03-10 MED ORDER — VANCOMYCIN HCL IN DEXTROSE 1-5 GM/200ML-% IV SOLN: 1000.0000 mg | INTRAVENOUS | Status: DC
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
## 2020-03-10 NOTE — Progress Notes (Signed)
PROGRESS NOTE    [name]  IRS:[id number] DOB: 12-04-1961 DOA: 03/09/2020 PCP: [name], FNP    Brief Narrative:  59 year old gentleman with history of ESRD on hemodialysis, MWF schedule, type 2 diabetes on insulin, GERD, hepatitis C, hypertension, severe peripheral vascular disease status post bilateral below-knee amputations, peripheral vascular disease and recent right index finger infection with interphalangeal amputation presented from home with lethargy and altered mental status.  Wife reported that he was incoherent and slurring his speech.  He was very somnolent.  Patient also taking oxycodone every 6 hours for pain.  Patient also has history of ischemia of the hands.  In the emergency room, febrile with temperature 100.7.  Blood pressure stable.  Matter-of-fact elevated.  WBC count 13.1.  Lactate normal.  COVID-19 negative.  Chest x-ray no acute findings.  Gangrenous right index finger with x-ray evidence of destruction of bone.  Started on broad-spectrum antibiotics and admitted.   Assessment & Plan:   Principal Problem:   Fever Active Problems:   HTN (hypertension)   Type 2 diabetes mellitus ([place])   ESRD (end stage renal disease) ([place])   Acute metabolic encephalopathy  Sepsis present on admission, resolving  Primary source unknown.  Probably right index finger infection.  Blood cultures collected.  COVID-19 negative.  We will continue broad-spectrum antibiotics for next 24 hours and de-escalate depends upon the progress. Seen by hand surgery, plan for right index finger ray amputation on 4/9.  Acute metabolic infective encephalopathy: No focal deficit.  Slowly improving.  Continue to monitor.  Hypertension: Accelerated.  Resume home medications including beta-blockers.  Also on as needed blood pressure medications.  Type 2 diabetes on insulin, uncontrolled with hypoglycemia: Patient had hypoglycemic episodes in the morning with poor appetite and skipped food.   Treated with dextrose.  Discontinue all long-acting insulin until patient has good appetite.  Keep on sliding scale insulin.  ESRD on hemodialysis: Notified nephrology.  Scheduled for dialysis today.  DVT prophylaxis: Subcu heparin Code Status: Full code Family Communication: Called patient's wife number, she was unable to pick up Disposition Plan: patient is from home. Anticipated DC to home, Barriers to discharge admitted overnight with sepsis, will need dialysis, he will also need hand surgery.   Consultants:   Nephrology  Hand surgery  Procedures:   None  Antimicrobials:  Anti-infectives (From admission, onward)   Start     Dose/Rate Route Frequency Ordered Stop   03/12/20 1200  ceFEPIme (MAXIPIME) 2 g in sodium chloride 0.9 % 100 mL IVPB     2 g 200 mL/hr over 30 Minutes Intravenous Every M-W-F (Hemodialysis) 03/10/20 0249     03/12/20 1200  vancomycin (VANCOCIN) IVPB 1000 mg/200 mL premix     1,000 mg 200 mL/hr over 60 Minutes Intravenous Every M-W-F (Hemodialysis) 03/10/20 0249     03/10/20 1200  ceFEPIme (MAXIPIME) 2 g in sodium chloride 0.9 % 100 mL IVPB  Status:  Discontinued     2 g 200 mL/hr over 30 Minutes Intravenous Every M-W-F (Hemodialysis) 03/10/20 0245 03/10/20 0249   03/10/20 1200  vancomycin (VANCOCIN) IVPB 1000 mg/200 mL premix  Status:  Discontinued     1,000 mg 200 mL/hr over 60 Minutes Intravenous Every M-W-F (Hemodialysis) 03/10/20 0245 03/10/20 0249   03/10/20 0800  metroNIDAZOLE (FLAGYL) IVPB 500 mg     500 mg 100 mL/hr over 60 Minutes Intravenous Every 8 hours 03/10/20 0210     03/10/20 0030  metroNIDAZOLE (FLAGYL) IVPB 500 mg  500 mg 100 mL/hr over 60 Minutes Intravenous  Once 03/10/20 0022 03/10/20 0336   03/10/20 0015  ceFEPIme (MAXIPIME) 2 g in sodium chloride 0.9 % 100 mL IVPB     2 g 200 mL/hr over 30 Minutes Intravenous NOW 03/10/20 0001 03/10/20 0354   03/10/20 0015  vancomycin (VANCOREADY) IVPB 2000 mg/400 mL     2,000 mg 200  mL/hr over 120 Minutes Intravenous NOW 03/10/20 0001 03/10/20 0536         Subjective: Patient seen and examined.  Remains in the emergency room waiting for inpatient bed assignment.  Admitted early morning hours by nighttime hospitalist.  On my interview, he was still sleepy, however he wanted to eat and drink some water.  Complains of pain on his right index finger otherwise no other complaints.  He is not sure why he is so tired.  Objective: Vitals:   03/10/20 1315 03/10/20 1330 03/10/20 1345 03/10/20 1400  BP: (!) 159/77 (!) 174/85 (!) 162/88 (!) 171/90  Pulse: 88 88 89 89  Resp: 12 12 10 12   Temp:      TempSrc:      SpO2: 96% 98% 96% 97%  Weight:      Height:        Intake/Output Summary (Last 24 hours) at 03/10/2020 1414 Last data filed at 03/10/2020 0536 Gross per 24 hour  Intake 900 ml  Output --  Net 900 ml   Filed Weights   03/09/20 2115  Weight: 100 kg    Examination:  General exam: Appears calm and comfortable, chronically sick looking gentleman on room air. Respiratory system: Clear to auscultation. Respiratory effort normal.  Patient has a permacath on his left chest. Cardiovascular system: S1 & S2 heard, RRR. No JVD, murmurs, rubs, gallops or clicks. Gastrointestinal system: Abdomen is distended but not tender , soft and nontender. No organomegaly or masses felt. Normal bowel sounds heard. Central nervous system: Alert and oriented. No focal neurological deficits.  Sleepy. Extremities: Symmetric 5 x 5 power.  Can move all extremities equally. Psychiatry: Judgement and insight appear normal. Mood & affect appropriate.  Bilateral below-knee amputation, left below-knee amputation stump clean and dry.  Right below-knee amputation stump with irregularity, no open wound. Right index finger is gangrenous, swelling to the base of the index finger, sutures intact.   Data Reviewed: I have personally reviewed following labs and imaging studies  CBC: Recent Labs  Lab  03/09/20 2130 03/10/20 0313  WBC 13.1*  --   HGB 8.8* 9.9*  HCT 26.9* 29.0*  MCV 76.0*  --   PLT 280  --    Basic Metabolic Panel: Recent Labs  Lab 03/09/20 2130 03/09/20 2134 03/10/20 0313  NA 137  --  136  K 3.8  --  3.8  CL 93*  --   --   CO2 22  --   --   GLUCOSE 96  --   --   BUN 67*  --   --   CREATININE 12.46*  --   --   CALCIUM 7.8*  --   --   MG  --  2.4  --    GFR: Estimated Creatinine Clearance: 7.6 mL/min (A) (by C-G formula based on SCr of 12.46 mg/dL (H)). Liver Function Tests: Recent Labs  Lab 03/09/20 2130  AST 107*  ALT 169*  ALKPHOS 93  BILITOT 1.0  PROT 8.1  ALBUMIN 3.3*   No results for input(s): LIPASE, AMYLASE in the last 168 hours. Recent  Labs  Lab 03/10/20 0359  AMMONIA 43*   Coagulation Profile: Recent Labs  Lab 03/09/20 2134  INR 1.2   Cardiac Enzymes: No results for input(s): CKTOTAL, CKMB, CKMBINDEX, TROPONINI in the last 168 hours. BNP (last 3 results) No results for input(s): PROBNP in the last 8760 hours. HbA1C: No results for input(s): HGBA1C in the last 72 hours. CBG: Recent Labs  Lab 03/10/20 0736 03/10/20 0807 03/10/20 0927 03/10/20 0929 03/10/20 1305  GLUCAP 47* 163* 48* 92 161*   Lipid Profile: No results for input(s): CHOL, HDL, LDLCALC, TRIG, CHOLHDL, LDLDIRECT in the last 72 hours. Thyroid Function Tests: Recent Labs    03/10/20 0344  TSH 2.680   Anemia Panel: Recent Labs    03/10/20 0344  VITAMINB12 3,251*   Sepsis Labs: Recent Labs  Lab 03/09/20 2228 03/10/20 0047  LATICACIDVEN 1.2 1.4    Recent Results (from the past 240 hour(s))  Blood Culture (routine x 2)     Status: None (Preliminary result)   Collection Time: 03/09/20  9:34 PM   Specimen: BLOOD RIGHT FOREARM  Result Value Ref Range Status   Specimen Description BLOOD RIGHT FOREARM  Final   Special Requests   Final    BOTTLES DRAWN AEROBIC AND ANAEROBIC Blood Culture results may not be optimal due to an inadequate volume of  blood received in culture bottles   Culture   Final    NO GROWTH < 12 HOURS Performed at [facility], [place] [address] [place], [place] [address]    Report Status PENDING  Incomplete  SARS CORONAVIRUS 2 (TAT 6-24 HRS) Nasopharyngeal Nasopharyngeal Swab     Status: None   Collection Time: 03/10/20  2:53 AM   Specimen: Nasopharyngeal Swab  Result Value Ref Range Status   SARS Coronavirus 2 NEGATIVE NEGATIVE Final    Comment: (NOTE) SARS-CoV-2 target nucleic acids are NOT DETECTED. The SARS-CoV-2 RNA is generally detectable in upper and lower respiratory specimens during the acute phase of infection. Negative results do not preclude SARS-CoV-2 infection, do not rule out co-infections with other pathogens, and should not be used as the sole basis for treatment or other patient management decisions. Negative results must be combined with clinical observations, patient history, and epidemiological information. The expected result is Negative. Fact Sheet for Patients: SugarRoll.be Fact Sheet for Healthcare Providers: https://www.woods-mathews.com/ This test is not yet approved or cleared by the [place] FDA and  has been authorized for detection and/or diagnosis of SARS-CoV-2 by FDA under an Emergency Use Authorization (EUA). This EUA will remain  in effect (meaning this test can be used) for the duration of the COVID-19 declaration under Section 56 4(b)(1) of the Act, 21 U.S.C. section 360bbb-3(b)(1), unless the authorization is terminated or revoked sooner. Performed at [facility], [place] [address] [place], [place] [address]          Radiology Studies: CT Head Wo Contrast  Result Date: 03/09/2020 CLINICAL DATA:  Altered mental status. EXAM: CT HEAD WITHOUT CONTRAST TECHNIQUE: Contiguous axial images were obtained from the base of the skull through the vertex without intravenous contrast. COMPARISON:  None. FINDINGS:  Brain: There is mild cerebral atrophy with widening of the extra-axial spaces and ventricular dilatation. There are areas of decreased attenuation within the white matter tracts of the supratentorial brain, consistent with microvascular disease changes. A small area of chronic white matter low attenuation is seen within the posterior aspect of the cerebellum on the left. Vascular: No hyperdense vessel or  unexpected calcification. Skull: Normal. Negative for fracture or focal lesion. Sinuses/Orbits: No acute finding. Other: None. IMPRESSION: 1. Generalized cerebral atrophy. 2. No acute intracranial abnormality. Electronically Signed   By: Virgina Norfolk M.D.   On: 03/09/2020 23:15   DG Chest Port 1 View  Result Date: 03/09/2020 CLINICAL DATA:  Altered mental status EXAM: PORTABLE CHEST 1 VIEW COMPARISON:  03/03/2020 FINDINGS: Low lung volumes, bibasilar atelectasis. Heart is normal size. No effusions or acute bony abnormality. IMPRESSION: Low lung volumes, bibasilar atelectasis. Electronically Signed   By: Rolm Baptise M.D.   On: 03/09/2020 22:50   DG Hand Complete Right  Result Date: 03/10/2020 CLINICAL DATA:  Right hand pain and fever. EXAM: RIGHT HAND - COMPLETE 3+ VIEW COMPARISON:  December 30, 2019 FINDINGS: The patient is status post amputation of the distal phalanx of the second digit. There is irregularity of the soft tissue surrounding the head of the middle phalanx of the second digit. There is questionable increasing lucency involving the head of the middle phalanx of the second digit. Evaluation was limited by suboptimal lateral view. Advanced vascular calcifications are noted. There are degenerative changes of the interphalangeal joints. IMPRESSION: 1. Interval amputation of the distal phalanx of the second digit. 2. There is an irregular appearance of the soft tissues overlying the head of the middle phalanx of the second digit. Correlation with physical exam is recommended. 3. Subtle lucency  involving the head of the middle phalanx of the second digit could represent developing osteomyelitis in the appropriate clinical setting. A follow-up nonemergent MRI would be useful for further characterization. Electronically Signed   By: Constance Holster M.D.   On: 03/10/2020 01:19   US Abdomen Limited RUQ  Result Date: 03/10/2020 CLINICAL DATA:  Elevated liver function tests. EXAM: ULTRASOUND ABDOMEN LIMITED RIGHT UPPER QUADRANT COMPARISON:  None. FINDINGS: Gallbladder: Gallbladder is mildly distended. No gallstones or wall thickening visualized (3.6 mm). No sonographic Murphy sign noted by sonographer. Common bile duct: Diameter: 4.6 mm Liver: No focal lesion identified. Diffusely increased echogenicity of the liver parenchyma is seen. Portal vein is patent on color Doppler imaging with normal direction of blood flow towards the liver. Other: None. IMPRESSION: 1. Fatty liver. 2. Mildly distended gallbladder without evidence of cholelithiasis or acute cholecystitis. Electronically Signed   By: Virgina Norfolk M.D.   On: 03/10/2020 02:50        Scheduled Meds:  Chlorhexidine Gluconate Cloth  6 each Topical Q0600   darbepoetin (ARANESP) injection - DIALYSIS  100 mcg Intravenous Q Wed-HD   heparin injection (subcutaneous)  5,000 Units Subcutaneous Q8H   insulin aspart  0-5 Units Subcutaneous QHS   insulin aspart  0-6 Units Subcutaneous TID WC   Continuous Infusions:  [START ON 03/12/2020] ceFEPime (MAXIPIME) IV     metronidazole Stopped (03/10/20 0910)   [START ON 03/12/2020] vancomycin       LOS: 0 days    Time spent: 30 minutes    Barb Merino, MD [facility] Pager [phone]

## 2020-03-10 NOTE — Progress Notes (Signed)
Pharmacy Antibiotic Note  [name] is a 59 y.o. male admitted on 03/09/2020 with sepsis.  Pharmacy has been consulted for Vancomycin and Cefepime dosing. Pt also on Flagyl.  Noted pt with ESRD - HD on MWF  Plan: Cefepime 2gm IV qHD Vancomycin 2gm IV now then 1000 mg IV qHD.  Will f/u HD schedule and tolerance, micro data, and pt's clinical condition Vanc levels prn   Height: 6\' 4"  (193 cm) Weight: 100 kg (220 lb 7.4 oz) IBW/kg (Calculated) : 86.8  Temp (24hrs), Avg:99.2 F (37.3 C), Min:97.7 F (36.5 C), Max:100.7 F (38.2 C)  Recent Labs  Lab 03/03/20 1059 03/09/20 2130 03/09/20 2228 03/10/20 0047  WBC 11.2* 13.1*  --   --   CREATININE 10.87* 12.46*  --   --   LATICACIDVEN 1.4  --  1.2 1.4    Estimated Creatinine Clearance: 7.6 mL/min (A) (by C-G formula based on SCr of 12.46 mg/dL (H)).    No Known Allergies   Antimicrobials this admission: 4/7 Vanc >>  4/7 Cefepime >>   Microbiology results: 4/6 BCx: UCx:   Thank you for allowing pharmacy to be a part of this patient's care.  Sherlon Handing, PharmD, BCPS Please see amion for complete clinical pharmacist phone list 03/10/2020 2:42 AM

## 2020-03-10 NOTE — ED Notes (Signed)
Patient in bed, laying comfortably, arousable with verbal stimuli. Blood glucose levels re-checked at this time and noted lower value on affected extremity (right). Patient is talking and answering appropriately.

## 2020-03-10 NOTE — Progress Notes (Signed)
Pt returned to [place] from dialysis by transporter. Notified telemetry that pt is back from dialysis. Will continue to monitor pt. Ranelle Oyster, RN
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
## 2020-03-10 NOTE — Plan of Care (Signed)
Plan of care reviewed with pt at bedside. Oriented to unit. VSS. Skin check complete. Left wound noted to BKA, right index finger necrotic, no other abnormalities found. Prosthetics at bedside.  Pt transferred off unit for HD.  Problem: Education: Goal: Knowledge of General Education information will improve Description: Including pain rating scale, medication(s)/side effects and non-pharmacologic comfort measures Outcome: Progressing   Problem: Health Behavior/Discharge Planning: Goal: Ability to manage health-related needs will improve Outcome: Progressing   Problem: Clinical Measurements: Goal: Ability to maintain clinical measurements within normal limits will improve Outcome: Progressing Goal: Will remain free from infection Outcome: Progressing Goal: Diagnostic test results will improve Outcome: Progressing Goal: Respiratory complications will improve Outcome: Progressing Goal: Cardiovascular complication will be avoided Outcome: Progressing   Problem: Activity: Goal: Risk for activity intolerance will decrease Outcome: Progressing   Problem: Nutrition: Goal: Adequate nutrition will be maintained Outcome: Progressing   Problem: Coping: Goal: Level of anxiety will decrease Outcome: Progressing   Problem: Elimination: Goal: Will not experience complications related to bowel motility Outcome: Progressing Goal: Will not experience complications related to urinary retention Outcome: Progressing   Problem: Pain Managment: Goal: General experience of comfort will improve Outcome: Progressing   Problem: Safety: Goal: Ability to remain free from injury will improve Outcome: Progressing   Problem: Skin Integrity: Goal: Risk for impaired skin integrity will decrease Outcome: Progressing
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
## 2020-03-10 NOTE — ED Notes (Signed)
Per attending, hold on all insulin at this time due to hypoglycemic episodes. Pt A+Ox4, able to tolerate PO. Given sandwich and sprite. AC insulin held at this time.

## 2020-03-10 NOTE — ED Notes (Signed)
Verified with dialysis -- pt is on schedule to be dialyzed today however dialysis nurse is unsure what time.

## 2020-03-11 ENCOUNTER — Encounter (HOSPITAL_COMMUNITY): Payer: Self-pay | Admitting: Internal Medicine

## 2020-03-11 DIAGNOSIS — A419 Sepsis, unspecified organism: Secondary | ICD-10-CM | POA: Diagnosis present

## 2020-03-11 LAB — CBC WITH DIFFERENTIAL/PLATELET
Abs Immature Granulocytes: 0.23 10*3/uL — ABNORMAL HIGH (ref 0.00–0.07)
Basophils Absolute: 0 10*3/uL (ref 0.0–0.1)
Basophils Relative: 0 %
Eosinophils Absolute: 0.2 10*3/uL (ref 0.0–0.5)
Eosinophils Relative: 2 %
HCT: 26.5 % — ABNORMAL LOW (ref 39.0–52.0)
Hemoglobin: 8.4 g/dL — ABNORMAL LOW (ref 13.0–17.0)
Immature Granulocytes: 2 %
Lymphocytes Relative: 14 %
Lymphs Abs: 1.4 10*3/uL (ref 0.7–4.0)
MCH: 24.6 pg — ABNORMAL LOW (ref 26.0–34.0)
MCHC: 31.7 g/dL (ref 30.0–36.0)
MCV: 77.7 fL — ABNORMAL LOW (ref 80.0–100.0)
Monocytes Absolute: 1.5 10*3/uL — ABNORMAL HIGH (ref 0.1–1.0)
Monocytes Relative: 15 %
Neutro Abs: 6.9 10*3/uL (ref 1.7–7.7)
Neutrophils Relative %: 67 %
Platelets: 238 10*3/uL (ref 150–400)
RBC: 3.41 MIL/uL — ABNORMAL LOW (ref 4.22–5.81)
RDW: 18.5 % — ABNORMAL HIGH (ref 11.5–15.5)
WBC: 10.2 10*3/uL (ref 4.0–10.5)
nRBC: 0.2 % (ref 0.0–0.2)

## 2020-03-11 LAB — COMPREHENSIVE METABOLIC PANEL
ALT: 106 U/L — ABNORMAL HIGH (ref 0–44)
AST: 59 U/L — ABNORMAL HIGH (ref 15–41)
Albumin: 3 g/dL — ABNORMAL LOW (ref 3.5–5.0)
Alkaline Phosphatase: 67 U/L (ref 38–126)
Anion gap: 17 — ABNORMAL HIGH (ref 5–15)
BUN: 45 mg/dL — ABNORMAL HIGH (ref 8–23)
CO2: 24 mmol/L (ref 22–32)
Calcium: 8.2 mg/dL — ABNORMAL LOW (ref 8.9–10.3)
Chloride: 98 mmol/L (ref 98–111)
Creatinine, Ser: 8.71 mg/dL — ABNORMAL HIGH (ref 0.61–1.24)
GFR calc Af Amer: 7 mL/min — ABNORMAL LOW (ref >60)
GFR calc non Af Amer: 6 mL/min — ABNORMAL LOW (ref >60)
Glucose, Bld: 168 mg/dL — ABNORMAL HIGH (ref 70–99)
Potassium: 4.1 mmol/L (ref 3.5–5.1)
Sodium: 139 mmol/L (ref 135–145)
Total Bilirubin: 1 mg/dL (ref 0.3–1.2)
Total Protein: 8.1 g/dL (ref 6.5–8.1)

## 2020-03-11 LAB — GLUCOSE, CAPILLARY
Glucose-Capillary: 149 mg/dL — ABNORMAL HIGH (ref 70–99)
Glucose-Capillary: 199 mg/dL — ABNORMAL HIGH (ref 70–99)
Glucose-Capillary: 153 mg/dL — ABNORMAL HIGH (ref 70–99)
Glucose-Capillary: 150 mg/dL — ABNORMAL HIGH (ref 70–99)

## 2020-03-11 LAB — MAGNESIUM: Magnesium: 2 mg/dL (ref 1.7–2.4)

## 2020-03-11 LAB — HEMOGLOBIN A1C
Hgb A1c MFr Bld: 7.5 % — ABNORMAL HIGH (ref 4.8–5.6)
Mean Plasma Glucose: 169 mg/dL

## 2020-03-11 MED ORDER — CEFAZOLIN SODIUM-DEXTROSE 2-4 GM/100ML-% IV SOLN
2.0000 g | INTRAVENOUS | Status: AC
  Administered 2020-03-12: 2 g via INTRAVENOUS
  Filled 2020-03-11 (×2): qty 100

## 2020-03-11 MED ORDER — DARBEPOETIN ALFA 100 MCG/0.5ML IJ SOSY
100.0000 ug | PREFILLED_SYRINGE | Freq: Once | INTRAMUSCULAR | Status: DC
  Filled 2020-03-11: qty 0.5

## 2020-03-11 MED ORDER — VANCOMYCIN HCL IN DEXTROSE 1-5 GM/200ML-% IV SOLN
1000.0000 mg | Freq: Once | INTRAVENOUS | Status: AC
  Administered 2020-03-11: 1000 mg via INTRAVENOUS
  Filled 2020-03-11: qty 200

## 2020-03-11 MED ORDER — OXYCODONE-ACETAMINOPHEN 5-325 MG PO TABS
1.0000 | ORAL_TABLET | ORAL | Status: DC | PRN
  Administered 2020-03-11 – 2020-03-13 (×11): 1 via ORAL
  Filled 2020-03-11 (×11): qty 1

## 2020-03-11 MED ORDER — POVIDONE-IODINE 10 % EX SWAB
2.0000 "application " | Freq: Once | CUTANEOUS | Status: AC
  Administered 2020-03-12: 2 via TOPICAL

## 2020-03-11 MED ORDER — SODIUM CHLORIDE 0.9 % IV SOLN
2.0000 g | INTRAVENOUS | Status: AC
  Administered 2020-03-11: 2 g via INTRAVENOUS
  Filled 2020-03-11: qty 2

## 2020-03-11 MED ORDER — DARBEPOETIN ALFA 100 MCG/0.5ML IJ SOSY
100.0000 ug | PREFILLED_SYRINGE | Freq: Once | INTRAMUSCULAR | Status: AC
  Administered 2020-03-11: 100 ug via SUBCUTANEOUS
  Filled 2020-03-11: qty 0.5

## 2020-03-11 NOTE — Progress Notes (Signed)
[name] Progress Note   Subjective:  Completed HD yesterday evening.  Seen in room, more alert today. Conversant.  Denies CP, SOB   Objective Vitals:   03/11/20 0400 03/11/20 0601 03/11/20 0800 03/11/20 1142  BP: (!) 144/79   (!) 173/87  Pulse: 82 78  78  Resp: 10 12    Temp:   98.7 F (37.1 C) 98.3 F (36.8 C)  TempSrc:   Oral Oral  SpO2: 98% 98%  97%  Weight:  96 kg    Height:        Weight change: -4.2 kg    Physical Exam General: WNWD male, nad  Lungs: Breathing unlabored. CTA bilaterally Heart: RRR with S1 S2 Abdomen: soft non-tender, non-distended  Lower extremities: Bilat BKAs No stump edema Upper extremities: Right hand with necrotic index finder s/p amp distal phalanx  Dialysis Access: LUE AVF +bruit   Additional Objective Labs: Basic Metabolic Panel: Recent Labs  Lab 03/09/20 2130 03/10/20 0313 03/11/20 0402  NA 137 136 139  K 3.8 3.8 4.1  CL 93*  --  98  CO2 22  --  24  GLUCOSE 96  --  168*  BUN 67*  --  45*  CREATININE 12.46*  --  8.71*  CALCIUM 7.8*  --  8.2*   CBC: Recent Labs  Lab 03/09/20 2130 03/10/20 0313 03/11/20 0402  WBC 13.1*  --  10.2  NEUTROABS  --   --  6.9  HGB 8.8* 9.9* 8.4*  HCT 26.9* 29.0* 26.5*  MCV 76.0*  --  77.7*  PLT 280  --  238   Blood Culture    Component Value Date/Time   SDES BLOOD RIGHT HAND 03/09/2020 2328   SPECREQUEST  03/09/2020 2328    BOTTLES DRAWN AEROBIC AND ANAEROBIC Blood Culture results may not be optimal due to an excessive volume of blood received in culture bottles   CULT  03/09/2020 2328    NO GROWTH 1 DAY Performed at [facility], [place] [address] [place], [place] [address]    REPTSTATUS PENDING 03/09/2020 2328    Medications: . sodium chloride    . sodium chloride    . [START ON 03/12/2020] ceFEPime (MAXIPIME) IV    . [START ON 03/12/2020] vancomycin     . carvedilol  12.5 mg Oral BID WC  . Chlorhexidine Gluconate Cloth  6 each Topical Q0600  .  darbepoetin (ARANESP) injection - DIALYSIS  100 mcg Intravenous Q Wed-HD  . darbepoetin (ARANESP) injection - DIALYSIS  100 mcg Subcutaneous Once  . gabapentin  100 mg Oral QHS  . heparin  4,600 Units Dialysis Once in dialysis  . heparin injection (subcutaneous)  5,000 Units Subcutaneous Q8H  . insulin aspart  0-5 Units Subcutaneous QHS  . insulin aspart  0-6 Units Subcutaneous TID WC  . sevelamer carbonate  2,400 mg Oral TID WC    Dialysis Orders:  [facility] MWF 4h76min 425/800 EDW 98kg 2K/2.5Ca AVF Hep 4600 Mircera 100 (due 4/7) Venofer 50 Calcitriol 1.75   Assessment/Plan: 1. Fever/leukocytosis on admission. COVID negative. Blood cultures neg to date. Antibiotics per primary  2. Nonhealing R finger ulcer s/p recent amp - ? Source for #. Hand surgery following. May require right index ray amputation ?Friday.  3. ESRD -  HD MWF. Next HD Fri or Sat depending on surgery schedule. He prefers not to do both on same day.  4. Hypertension/volume  - BP evated. Continue home meds. No volume excess on exam.  Below OP EDW here. UF as tolerated.  5. Anemia  - Hgb 8.4 Aranesp 100 with HD 4/7.  6. Metabolic bone disease -  Continue binders/VDRA.  7. Nutrition - Renal diet/vitamins  8. DM - per primary   Lynnda Child PA-C [place] Pager [phone] 03/11/2020,1:06 PM

## 2020-03-11 NOTE — Progress Notes (Signed)
Aranesp dose was not charted on 4/7. Rn did call HD. We will give a one time off schedule dose today.

## 2020-03-11 NOTE — Progress Notes (Signed)
PROGRESS NOTE    [name]  ZOX:[id number] DOB: 05-17-1961 DOA: 03/09/2020 PCP: [name], FNP    Brief Narrative:  59 year old gentleman with history of ESRD on hemodialysis, MWF schedule, type 2 diabetes on insulin, GERD, hepatitis C, hypertension, severe peripheral vascular disease status post bilateral below-knee amputations, peripheral vascular disease and recent right index finger infection with interphalangeal amputation presented from home with lethargy and altered mental status.  Wife reported that he was incoherent and slurring his speech.  He was very somnolent.  Patient also taking oxycodone every 6 hours for pain.  Patient also has history of ischemia of the hands.  In the emergency room, febrile with temperature 100.7.  Blood pressure stable.  Matter-of-fact elevated.  WBC count 13.1.  Lactate normal.  COVID-19 negative.  Chest x-ray no acute findings.  Gangrenous right index finger with x-ray evidence of destruction of bone.  Started on broad-spectrum antibiotics and admitted.   Assessment & Plan:   Principal Problem:   Fever Active Problems:   HTN (hypertension)   Type 2 diabetes mellitus ([place])   ESRD (end stage renal disease) ([place])   Acute metabolic encephalopathy  Sepsis present on admission, resolving  Probable primary source from right finger with extended infection.  Blood cultures collected.  COVID-19 negative.  Continue vancomycin and cefepime.  Discontinue Flagyl.   Seen by hand surgery, plan for right index finger ray amputation on 4/9.  Hoping for surgical resection to healthy margins.  Acute metabolic infective encephalopathy: No focal deficit.  Improved.   Hypertension: Accelerated.  Resume home medications including beta-blockers.  Also on as needed blood pressure medications.  Type 2 diabetes on insulin, uncontrolled with hypoglycemia: Patient had hypoglycemic episodes in the morning of admission due to not eating.  Improving.  Hold  long-acting insulin today until blood sugars fairly improved. Keep on sliding scale insulin.  ESRD on hemodialysis: On a schedule hemodialysis. DVT prophylaxis: Subcu heparin Code Status: Full code Family Communication: Patient. Disposition Plan: patient is from home. Anticipated DC to home, Barriers to discharge admitted overnight with sepsis, will need dialysis, he will also need hand surgery.   Consultants:   Nephrology  Hand surgery  Procedures:   None  Antimicrobials:  Anti-infectives (From admission, onward)   Start     Dose/Rate Route Frequency Ordered Stop   03/12/20 1200  ceFEPIme (MAXIPIME) 2 g in sodium chloride 0.9 % 100 mL IVPB     2 g 200 mL/hr over 30 Minutes Intravenous Every M-W-F (Hemodialysis) 03/10/20 0249     03/12/20 1200  vancomycin (VANCOCIN) IVPB 1000 mg/200 mL premix     1,000 mg 200 mL/hr over 60 Minutes Intravenous Every M-W-F (Hemodialysis) 03/10/20 0249     03/11/20 0145  ceFEPIme (MAXIPIME) 2 g in sodium chloride 0.9 % 100 mL IVPB     2 g 200 mL/hr over 30 Minutes Intravenous NOW 03/11/20 0131 03/11/20 0235   03/11/20 0145  vancomycin (VANCOCIN) IVPB 1000 mg/200 mL premix     1,000 mg 200 mL/hr over 60 Minutes Intravenous  Once 03/11/20 0132 03/11/20 0348   03/10/20 1200  ceFEPIme (MAXIPIME) 2 g in sodium chloride 0.9 % 100 mL IVPB  Status:  Discontinued     2 g 200 mL/hr over 30 Minutes Intravenous Every M-W-F (Hemodialysis) 03/10/20 0245 03/10/20 0249   03/10/20 1200  vancomycin (VANCOCIN) IVPB 1000 mg/200 mL premix  Status:  Discontinued     1,000 mg 200 mL/hr over 60 Minutes Intravenous Every M-W-F (  Hemodialysis) 03/10/20 0245 03/10/20 0249   03/10/20 0800  metroNIDAZOLE (FLAGYL) IVPB 500 mg  Status:  Discontinued     500 mg 100 mL/hr over 60 Minutes Intravenous Every 8 hours 03/10/20 0210 03/11/20 0937   03/10/20 0030  metroNIDAZOLE (FLAGYL) IVPB 500 mg     500 mg 100 mL/hr over 60 Minutes Intravenous  Once 03/10/20 0022 03/10/20 0336     03/10/20 0015  ceFEPIme (MAXIPIME) 2 g in sodium chloride 0.9 % 100 mL IVPB     2 g 200 mL/hr over 30 Minutes Intravenous NOW 03/10/20 0001 03/10/20 0354   03/10/20 0015  vancomycin (VANCOREADY) IVPB 2000 mg/400 mL     2,000 mg 200 mL/hr over 120 Minutes Intravenous NOW 03/10/20 0001 03/10/20 0536         Subjective: Seen and examined.  No overnight events.  Now alert awake.  Right index finger hurts.  Objective: Vitals:   03/11/20 0341 03/11/20 0400 03/11/20 0601 03/11/20 0800  BP: (!) 173/76 (!) 144/79    Pulse: 82 82 78   Resp: 20 10 12    Temp: 98.9 F (37.2 C)   98.7 F (37.1 C)  TempSrc: Oral   Oral  SpO2: 100% 98% 98%   Weight:   96 kg   Height:        Intake/Output Summary (Last 24 hours) at 03/11/2020 1111 Last data filed at 03/11/2020 0900 Gross per 24 hour  Intake 1440 ml  Output --  Net 1440 ml   Filed Weights   03/09/20 2115 03/10/20 1845 03/11/20 0601  Weight: 100 kg 95.8 kg 96 kg    Examination:  General exam: Appears calm and comfortable,  Respiratory system: Clear to auscultation. Respiratory effort normal.  Patient has a permacath on his left chest. Cardiovascular system: S1 & S2 heard, RRR. No JVD, murmurs, rubs, gallops or clicks. Gastrointestinal system: Abdomen is distended but not tender , soft and nontender. No organomegaly or masses felt. Normal bowel sounds heard. Central nervous system: Alert and oriented. No focal neurological deficits.  Extremities: Symmetric 5 x 5 power.  Can move all extremities equally. Psychiatry: Judgement and insight appear normal. Mood & affect appropriate.  Bilateral below-knee amputation, left below-knee amputation stump clean and dry.  Right below-knee amputation stump with irregularity, no open wound. Right index finger is gangrenous, swelling to the base of the index finger, sutures intact.   Data Reviewed: I have personally reviewed following labs and imaging studies  CBC: Recent Labs  Lab  03/09/20 2130 03/10/20 0313 03/11/20 0402  WBC 13.1*  --  10.2  NEUTROABS  --   --  6.9  HGB 8.8* 9.9* 8.4*  HCT 26.9* 29.0* 26.5*  MCV 76.0*  --  77.7*  PLT 280  --  353   Basic Metabolic Panel: Recent Labs  Lab 03/09/20 2130 03/09/20 2134 03/10/20 0313 03/11/20 0402  NA 137  --  136 139  K 3.8  --  3.8 4.1  CL 93*  --   --  98  CO2 22  --   --  24  GLUCOSE 96  --   --  168*  BUN 67*  --   --  45*  CREATININE 12.46*  --   --  8.71*  CALCIUM 7.8*  --   --  8.2*  MG  --  2.4  --  2.0   GFR: Estimated Creatinine Clearance: 10.9 mL/min (A) (by C-G formula based on SCr of 8.71 mg/dL (H)). Liver Function  Tests: Recent Labs  Lab 03/09/20 2130 03/11/20 0402  AST 107* 59*  ALT 169* 106*  ALKPHOS 93 67  BILITOT 1.0 1.0  PROT 8.1 8.1  ALBUMIN 3.3* 3.0*   No results for input(s): LIPASE, AMYLASE in the last 168 hours. Recent Labs  Lab 03/10/20 0359  AMMONIA 43*   Coagulation Profile: Recent Labs  Lab 03/09/20 2134  INR 1.2   Cardiac Enzymes: No results for input(s): CKTOTAL, CKMB, CKMBINDEX, TROPONINI in the last 168 hours. BNP (last 3 results) No results for input(s): PROBNP in the last 8760 hours. HbA1C: Recent Labs    03/10/20 0344  HGBA1C 7.5*   CBG: Recent Labs  Lab 03/10/20 0929 03/10/20 1305 03/10/20 1729 03/10/20 2312 03/11/20 0745  GLUCAP 92 161* 92 117* 149*   Lipid Profile: No results for input(s): CHOL, HDL, LDLCALC, TRIG, CHOLHDL, LDLDIRECT in the last 72 hours. Thyroid Function Tests: Recent Labs    03/10/20 0344  TSH 2.680   Anemia Panel: Recent Labs    03/10/20 0344  VITAMINB12 3,251*   Sepsis Labs: Recent Labs  Lab 03/09/20 2228 03/10/20 0047  LATICACIDVEN 1.2 1.4    Recent Results (from the past 240 hour(s))  Blood Culture (routine x 2)     Status: None (Preliminary result)   Collection Time: 03/09/20  9:34 PM   Specimen: BLOOD RIGHT FOREARM  Result Value Ref Range Status   Specimen Description BLOOD RIGHT  FOREARM  Final   Special Requests   Final    BOTTLES DRAWN AEROBIC AND ANAEROBIC Blood Culture results may not be optimal due to an inadequate volume of blood received in culture bottles   Culture   Final    NO GROWTH 2 DAYS Performed at [facility], [place] [address], [place], [place] [address]    Report Status PENDING  Incomplete  Blood Culture (routine x 2)     Status: None (Preliminary result)   Collection Time: 03/09/20 11:28 PM   Specimen: BLOOD RIGHT HAND  Result Value Ref Range Status   Specimen Description BLOOD RIGHT HAND  Final   Special Requests   Final    BOTTLES DRAWN AEROBIC AND ANAEROBIC Blood Culture results may not be optimal due to an excessive volume of blood received in culture bottles   Culture   Final    NO GROWTH 1 DAY Performed at [facility], [place] [address], [place], [place] [address]    Report Status PENDING  Incomplete  SARS CORONAVIRUS 2 (TAT 6-24 HRS) Nasopharyngeal Nasopharyngeal Swab     Status: None   Collection Time: 03/10/20  2:53 AM   Specimen: Nasopharyngeal Swab  Result Value Ref Range Status   SARS Coronavirus 2 NEGATIVE NEGATIVE Final    Comment: (NOTE) SARS-CoV-2 target nucleic acids are NOT DETECTED. The SARS-CoV-2 RNA is generally detectable in upper and lower respiratory specimens during the acute phase of infection. Negative results do not preclude SARS-CoV-2 infection, do not rule out co-infections with other pathogens, and should not be used as the sole basis for treatment or other patient management decisions. Negative results must be combined with clinical observations, patient history, and epidemiological information. The expected result is Negative. Fact Sheet for Patients: SugarRoll.be Fact Sheet for Healthcare Providers: https://www.woods-mathews.com/ This test is not yet approved or cleared by the [place] FDA and  has been authorized for detection and/or diagnosis of  SARS-CoV-2 by FDA under an Emergency Use Authorization (EUA). This EUA will remain  in effect (meaning  this test can be used) for the duration of the COVID-19 declaration under Section 56 4(b)(1) of the Act, 21 U.S.C. section 360bbb-3(b)(1), unless the authorization is terminated or revoked sooner. Performed at [facility], [place] [address], [place], [place] [address]          Radiology Studies: CT Head Wo Contrast  Result Date: 03/09/2020 CLINICAL DATA:  Altered mental status. EXAM: CT HEAD WITHOUT CONTRAST TECHNIQUE: Contiguous axial images were obtained from the base of the skull through the vertex without intravenous contrast. COMPARISON:  None. FINDINGS: Brain: There is mild cerebral atrophy with widening of the extra-axial spaces and ventricular dilatation. There are areas of decreased attenuation within the white matter tracts of the supratentorial brain, consistent with microvascular disease changes. A small area of chronic white matter low attenuation is seen within the posterior aspect of the cerebellum on the left. Vascular: No hyperdense vessel or unexpected calcification. Skull: Normal. Negative for fracture or focal lesion. Sinuses/Orbits: No acute finding. Other: None. IMPRESSION: 1. Generalized cerebral atrophy. 2. No acute intracranial abnormality. Electronically Signed   By: Virgina Norfolk M.D.   On: 03/09/2020 23:15   DG Chest Port 1 View  Result Date: 03/09/2020 CLINICAL DATA:  Altered mental status EXAM: PORTABLE CHEST 1 VIEW COMPARISON:  03/03/2020 FINDINGS: Low lung volumes, bibasilar atelectasis. Heart is normal size. No effusions or acute bony abnormality. IMPRESSION: Low lung volumes, bibasilar atelectasis. Electronically Signed   By: Rolm Baptise M.D.   On: 03/09/2020 22:50   DG Hand Complete Right  Result Date: 03/10/2020 CLINICAL DATA:  Right hand pain and fever. EXAM: RIGHT HAND - COMPLETE 3+ VIEW COMPARISON:  December 30, 2019 FINDINGS: The patient is  status post amputation of the distal phalanx of the second digit. There is irregularity of the soft tissue surrounding the head of the middle phalanx of the second digit. There is questionable increasing lucency involving the head of the middle phalanx of the second digit. Evaluation was limited by suboptimal lateral view. Advanced vascular calcifications are noted. There are degenerative changes of the interphalangeal joints. IMPRESSION: 1. Interval amputation of the distal phalanx of the second digit. 2. There is an irregular appearance of the soft tissues overlying the head of the middle phalanx of the second digit. Correlation with physical exam is recommended. 3. Subtle lucency involving the head of the middle phalanx of the second digit could represent developing osteomyelitis in the appropriate clinical setting. A follow-up nonemergent MRI would be useful for further characterization. Electronically Signed   By: Constance Holster M.D.   On: 03/10/2020 01:19   US Abdomen Limited RUQ  Result Date: 03/10/2020 CLINICAL DATA:  Elevated liver function tests. EXAM: ULTRASOUND ABDOMEN LIMITED RIGHT UPPER QUADRANT COMPARISON:  None. FINDINGS: Gallbladder: Gallbladder is mildly distended. No gallstones or wall thickening visualized (3.6 mm). No sonographic Murphy sign noted by sonographer. Common bile duct: Diameter: 4.6 mm Liver: No focal lesion identified. Diffusely increased echogenicity of the liver parenchyma is seen. Portal vein is patent on color Doppler imaging with normal direction of blood flow towards the liver. Other: None. IMPRESSION: 1. Fatty liver. 2. Mildly distended gallbladder without evidence of cholelithiasis or acute cholecystitis. Electronically Signed   By: Virgina Norfolk M.D.   On: 03/10/2020 02:50        Scheduled Meds: . carvedilol  12.5 mg Oral BID WC  . Chlorhexidine Gluconate Cloth  6 each Topical Q0600  . darbepoetin (ARANESP) injection - DIALYSIS  100 mcg Intravenous Q  Wed-HD  . gabapentin  100 mg Oral QHS  . heparin  4,600 Units Dialysis Once in dialysis  . heparin injection (subcutaneous)  5,000 Units Subcutaneous Q8H  . insulin aspart  0-5 Units Subcutaneous QHS  . insulin aspart  0-6 Units Subcutaneous TID WC  . sevelamer carbonate  2,400 mg Oral TID WC   Continuous Infusions: . sodium chloride    . sodium chloride    . [START ON 03/12/2020] ceFEPime (MAXIPIME) IV    . [START ON 03/12/2020] vancomycin       LOS: 1 day    Time spent: 30 minutes    Barb Merino, MD [facility] Pager [phone]

## 2020-03-12 ENCOUNTER — Inpatient Hospital Stay (HOSPITAL_COMMUNITY): Payer: Medicare Other | Admitting: Certified Registered Nurse Anesthetist

## 2020-03-12 ENCOUNTER — Encounter (HOSPITAL_COMMUNITY)
Admission: EM | Disposition: A | Discharge status: Home / Self Care | Payer: Self-pay | Source: Home / Self Care | Attending: Internal Medicine

## 2020-03-12 ENCOUNTER — Encounter (HOSPITAL_COMMUNITY): Payer: Self-pay | Admitting: Internal Medicine

## 2020-03-12 HISTORY — PX: AMPUTATION: SHX166

## 2020-03-12 LAB — COMPREHENSIVE METABOLIC PANEL
ALT: 76 U/L — ABNORMAL HIGH (ref 0–44)
AST: 47 U/L — ABNORMAL HIGH (ref 15–41)
Albumin: 2.9 g/dL — ABNORMAL LOW (ref 3.5–5.0)
Alkaline Phosphatase: 62 U/L (ref 38–126)
Anion gap: 19 — ABNORMAL HIGH (ref 5–15)
BUN: 66 mg/dL — ABNORMAL HIGH (ref 8–23)
CO2: 23 mmol/L (ref 22–32)
Calcium: 8.1 mg/dL — ABNORMAL LOW (ref 8.9–10.3)
Chloride: 98 mmol/L (ref 98–111)
Creatinine, Ser: 11.44 mg/dL — ABNORMAL HIGH (ref 0.61–1.24)
GFR calc Af Amer: 5 mL/min — ABNORMAL LOW (ref >60)
GFR calc non Af Amer: 4 mL/min — ABNORMAL LOW (ref >60)
Glucose, Bld: 139 mg/dL — ABNORMAL HIGH (ref 70–99)
Potassium: 4.1 mmol/L (ref 3.5–5.1)
Sodium: 140 mmol/L (ref 135–145)
Total Bilirubin: 0.7 mg/dL (ref 0.3–1.2)
Total Protein: 7.8 g/dL (ref 6.5–8.1)

## 2020-03-12 LAB — GLUCOSE, CAPILLARY
Glucose-Capillary: 148 mg/dL — ABNORMAL HIGH (ref 70–99)
Glucose-Capillary: 140 mg/dL — ABNORMAL HIGH (ref 70–99)
Glucose-Capillary: 105 mg/dL — ABNORMAL HIGH (ref 70–99)
Glucose-Capillary: 138 mg/dL — ABNORMAL HIGH (ref 70–99)
Glucose-Capillary: 191 mg/dL — ABNORMAL HIGH (ref 70–99)

## 2020-03-12 SURGERY — AMPUTATION DIGIT
Anesthesia: General | Site: Finger | Laterality: Right

## 2020-03-12 MED ORDER — GLYCOPYRROLATE 0.2 MG/ML IJ SOLN
INTRAMUSCULAR | Status: DC | PRN
  Administered 2020-03-12 (×2): .1 mg via INTRAVENOUS

## 2020-03-12 MED ORDER — 0.9 % SODIUM CHLORIDE (POUR BTL) OPTIME
TOPICAL | Status: DC | PRN
  Administered 2020-03-12: 1000 mL

## 2020-03-12 MED ORDER — CHLORHEXIDINE GLUCONATE CLOTH 2 % EX PADS: 6.0000 | MEDICATED_PAD | Freq: Every day | CUTANEOUS | Status: DC

## 2020-03-12 MED ORDER — HYDROMORPHONE HCL 1 MG/ML IJ SOLN
0.2500 mg | INTRAMUSCULAR | Status: DC | PRN
  Administered 2020-03-12: 0.5 mg via INTRAVENOUS

## 2020-03-12 MED ORDER — SODIUM CHLORIDE 0.9 % IV SOLN: INTRAVENOUS | Status: DC

## 2020-03-12 MED ORDER — MIDAZOLAM HCL 2 MG/2ML IJ SOLN
INTRAMUSCULAR | Status: AC
  Filled 2020-03-12: qty 2

## 2020-03-12 MED ORDER — ONDANSETRON HCL 4 MG/2ML IJ SOLN
INTRAMUSCULAR | Status: AC
  Filled 2020-03-12: qty 4

## 2020-03-12 MED ORDER — DEXAMETHASONE SODIUM PHOSPHATE 10 MG/ML IJ SOLN
INTRAMUSCULAR | Status: AC
  Filled 2020-03-12: qty 1

## 2020-03-12 MED ORDER — ONDANSETRON HCL 4 MG/2ML IJ SOLN
INTRAMUSCULAR | Status: DC | PRN
  Administered 2020-03-12: 4 mg via INTRAVENOUS

## 2020-03-12 MED ORDER — EPHEDRINE SULFATE 50 MG/ML IJ SOLN
INTRAMUSCULAR | Status: DC | PRN
  Administered 2020-03-12: 10 mg via INTRAVENOUS
  Administered 2020-03-12 (×3): 5 mg via INTRAVENOUS

## 2020-03-12 MED ORDER — HYDROMORPHONE HCL 1 MG/ML IJ SOLN
0.5000 mg | INTRAMUSCULAR | Status: AC | PRN
  Administered 2020-03-12 – 2020-03-13 (×2): 0.5 mg via INTRAVENOUS
  Filled 2020-03-12 (×2): qty 0.5

## 2020-03-12 MED ORDER — HEPARIN SODIUM (PORCINE) 1000 UNIT/ML DIALYSIS
1500.0000 [IU] | INTRAMUSCULAR | Status: DC | PRN
  Filled 2020-03-12: qty 2

## 2020-03-12 MED ORDER — BUPIVACAINE HCL (PF) 0.25 % IJ SOLN
INTRAMUSCULAR | Status: AC
  Filled 2020-03-12: qty 30

## 2020-03-12 MED ORDER — OXYCODONE HCL 5 MG PO TABS: 5.0000 mg | ORAL_TABLET | Freq: Once | ORAL | Status: DC | PRN

## 2020-03-12 MED ORDER — BUPIVACAINE HCL (PF) 0.25 % IJ SOLN
INTRAMUSCULAR | Status: DC | PRN
  Administered 2020-03-12: 10 mL

## 2020-03-12 MED ORDER — HYDROMORPHONE HCL 1 MG/ML IJ SOLN
INTRAMUSCULAR | Status: AC
  Filled 2020-03-12: qty 1

## 2020-03-12 MED ORDER — OXYCODONE HCL 5 MG/5ML PO SOLN: 5.0000 mg | Freq: Once | ORAL | Status: DC | PRN

## 2020-03-12 MED ORDER — PROPOFOL 10 MG/ML IV BOLUS
INTRAVENOUS | Status: DC | PRN
  Administered 2020-03-12: 200 mg via INTRAVENOUS

## 2020-03-12 MED ORDER — PHENYLEPHRINE HCL (PRESSORS) 10 MG/ML IV SOLN
INTRAVENOUS | Status: DC | PRN
  Administered 2020-03-12: 40 ug via INTRAVENOUS
  Administered 2020-03-12 (×2): 80 ug via INTRAVENOUS
  Administered 2020-03-12: 120 ug via INTRAVENOUS
  Administered 2020-03-12: 80 ug via INTRAVENOUS

## 2020-03-12 MED ORDER — FENTANYL CITRATE (PF) 250 MCG/5ML IJ SOLN
INTRAMUSCULAR | Status: AC
  Filled 2020-03-12: qty 5

## 2020-03-12 MED ORDER — LIDOCAINE HCL (CARDIAC) PF 100 MG/5ML IV SOSY
PREFILLED_SYRINGE | INTRAVENOUS | Status: DC | PRN
  Administered 2020-03-12: 60 mg via INTRATRACHEAL

## 2020-03-12 MED ORDER — FENTANYL CITRATE (PF) 100 MCG/2ML IJ SOLN
INTRAMUSCULAR | Status: DC | PRN
  Administered 2020-03-12: 50 ug via INTRAVENOUS

## 2020-03-12 MED ORDER — ONDANSETRON HCL 4 MG/2ML IJ SOLN: 4.0000 mg | Freq: Once | INTRAMUSCULAR | Status: DC | PRN

## 2020-03-12 MED ORDER — DEXTROSE 50 % IV SOLN
12.5000 g | INTRAVENOUS | Status: AC
  Administered 2020-03-12: 12.5 g via INTRAVENOUS

## 2020-03-12 SURGICAL SUPPLY — 51 items
BLADE AVERAGE 25MMX9MM (BLADE) ×1
BLADE AVERAGE 25X9 (BLADE) ×1 IMPLANT
BLADE SURG 15 STRL LF DISP TIS (BLADE) IMPLANT
BLADE SURG 15 STRL SS (BLADE) ×3
BNDG CMPR 9X4 STRL LF SNTH (GAUZE/BANDAGES/DRESSINGS) ×1
BNDG ELASTIC 3X5.8 VLCR STR LF (GAUZE/BANDAGES/DRESSINGS) ×2 IMPLANT
BNDG ESMARK 4X9 LF (GAUZE/BANDAGES/DRESSINGS) ×3 IMPLANT
BNDG GAUZE ELAST 4 BULKY (GAUZE/BANDAGES/DRESSINGS) ×2 IMPLANT
CORD BIPOLAR FORCEPS 12FT (ELECTRODE) ×3 IMPLANT
COVER SURGICAL LIGHT HANDLE (MISCELLANEOUS) ×3 IMPLANT
COVER WAND RF STERILE (DRAPES) ×3 IMPLANT
CUFF TOURN SGL QUICK 18X4 (TOURNIQUET CUFF) ×2 IMPLANT
DRAIN PENROSE 1/4X12 LTX STRL (WOUND CARE) ×2 IMPLANT
DRAPE SURG 17X23 STRL (DRAPES) ×3 IMPLANT
DRSG PAD ABDOMINAL 8X10 ST (GAUZE/BANDAGES/DRESSINGS) ×2 IMPLANT
DRSG XEROFORM 1X8 (GAUZE/BANDAGES/DRESSINGS) ×2 IMPLANT
DURAPREP 26ML APPLICATOR (WOUND CARE) ×3 IMPLANT
GAUZE SPONGE 4X4 12PLY STRL (GAUZE/BANDAGES/DRESSINGS) ×2 IMPLANT
GLOVE SURG SYN 8.0 (GLOVE) ×3 IMPLANT
GLOVE SURG SYN 8.0 PF PI (GLOVE) ×1 IMPLANT
GOWN STRL REUS W/ TWL LRG LVL3 (GOWN DISPOSABLE) ×1 IMPLANT
GOWN STRL REUS W/ TWL XL LVL3 (GOWN DISPOSABLE) ×1 IMPLANT
GOWN STRL REUS W/TWL LRG LVL3 (GOWN DISPOSABLE) ×3
GOWN STRL REUS W/TWL XL LVL3 (GOWN DISPOSABLE) ×3
KIT BASIN OR (CUSTOM PROCEDURE TRAY) ×3 IMPLANT
KIT TURNOVER KIT B (KITS) ×3 IMPLANT
MANIFOLD NEPTUNE II (INSTRUMENTS) ×3 IMPLANT
NDL HYPO 25GX1X1/2 BEV (NEEDLE) IMPLANT
NEEDLE HYPO 25GX1X1/2 BEV (NEEDLE) ×3 IMPLANT
NS IRRIG 1000ML POUR BTL (IV SOLUTION) ×3 IMPLANT
PACK ORTHO EXTREMITY (CUSTOM PROCEDURE TRAY) ×3 IMPLANT
PAD ARMBOARD 7.5X6 YLW CONV (MISCELLANEOUS) ×6 IMPLANT
PAD CAST 3X4 CTTN HI CHSV (CAST SUPPLIES) IMPLANT
PADDING CAST COTTON 3X4 STRL (CAST SUPPLIES) ×3
SPECIMEN JAR SMALL (MISCELLANEOUS) ×3 IMPLANT
SPLINT PLASTER CAST XFAST 4X15 (CAST SUPPLIES) IMPLANT
SPLINT PLASTER XTRA FAST SET 4 (CAST SUPPLIES) ×2
SUCTION FRAZIER HANDLE 10FR (MISCELLANEOUS) ×2
SUCTION TUBE FRAZIER 10FR DISP (MISCELLANEOUS) IMPLANT
SUT MNCRL AB 4-0 PS2 18 (SUTURE) ×6 IMPLANT
SUT VIC AB 3-0 FS2 27 (SUTURE) ×6 IMPLANT
SUT VICRYL 3 0 BR 18  UND (SUTURE) ×2
SUT VICRYL 3 0 BR 18 UND (SUTURE) IMPLANT
SWAB COLLECTION DEVICE MRSA (MISCELLANEOUS) ×2 IMPLANT
SWAB CULTURE LIQUID MINI MALE (MISCELLANEOUS) ×2 IMPLANT
TOWEL GREEN STERILE (TOWEL DISPOSABLE) ×3 IMPLANT
TOWEL GREEN STERILE FF (TOWEL DISPOSABLE) ×3 IMPLANT
TUBE CONNECTING 12'X1/4 (SUCTIONS) ×1
TUBE CONNECTING 12X1/4 (SUCTIONS) ×1 IMPLANT
UNDERPAD 30X30 (UNDERPADS AND DIAPERS) ×3 IMPLANT
WATER STERILE IRR 1000ML POUR (IV SOLUTION) ×3 IMPLANT

## 2020-03-12 NOTE — Op Note (Signed)
Patient was taken to the operating suite and after the induction of adequate general anesthetic the right upper extremity was prepped and draped in the usual sterile fashion.  An Esmarch was used to exsanguinate the limb and the tourniquet placed on the forearm was inflated to 250 mmHg.  At this point time incision was made over the dorsal aspect of the index finger for a chronic gangrenous index finger.  We dissected sharply down the shaft the metacarpal and then went on both the radial ulnar sides of the metacarpal phalangeal joint to the palmar side.  We elevated a large radial based flap along the proximal phalanx.  We dissected on the palmar side down to the neurovascular bundles and identified both the radial ulnar digital artery and nerve.  These were identified and transected distally and drawn into the proximal wound.  Once this is done we sub-periostomy dissected the index metacarpal out from proximal distal and transected extensor mechanism proximally and do it distally.  Using a sagittal saw we transected the metacarpal and the proximal third just proximal to the metaphyseal diaphyseal flare.  We then subperiosteal I dissected out the index metacarpal to the level of the metacarpal phalangeal joint.  We then excised the collateral ligaments and the flexor tendons palmarly and were able to excise the index ray for pathologic confirmation.  Once this was done we identified the neurovascular bundles palmarly and drew them into the interosseous space with the metacarpal head existed.  These were tied off using 3-0 Vicryl ties and then encased in the periosteal to be created using 3-0 Vicryl as well.  Once this is done the wound was thoroughly irrigated.  We then closed primarily using 4-0 Monocryl sutures.  We had a little extra tissue distally and this was carefully resected using a 15 blade to achieve appropriate contour in the web.  We finished closing with the 4-0 Monocryl sutures.  We placed 1/4 inch  Penrose drain prior to wound closure.  We dressed with Xeroform, 4 x 4's, a compressive dressing and palmar splint.  We also injected quarter percent plain Marcaine postoperative for pain control.  The patient tolerated this procedure well went to recovery in stable fashion.

## 2020-03-12 NOTE — Anesthesia Preprocedure Evaluation (Signed)
Anesthesia Evaluation  Patient identified by MRN, date of birth, ID band Patient awake    Reviewed: Allergy & Precautions, NPO status , Patient's Chart, lab work & pertinent test results, reviewed documented beta blocker date and time   History of Anesthesia Complications Negative for: history of anesthetic complications  Airway Mallampati: II  TM Distance: >3 FB Neck ROM: Full    Dental  (+) Partial Upper, Missing, Dental Advisory Given,    Pulmonary former smoker,    Pulmonary exam normal breath sounds clear to auscultation       Cardiovascular hypertension, Pt. on medications and Pt. on home beta blockers + Peripheral Vascular Disease  Normal cardiovascular exam Rhythm:Regular Rate:Normal     Neuro/Psych negative neurological ROS  negative psych ROS   GI/Hepatic GERD  ,(+) Hepatitis -, C  Endo/Other  diabetes, Poorly Controlled, Type 2, Insulin Dependent  Renal/GU ESRF and DialysisRenal diseaseS/p HD 2 days ago  negative genitourinary   Musculoskeletal negative musculoskeletal ROS (+)   Abdominal (+) + obese,   Peds  Hematology  (+) Blood dyscrasia, anemia , hct 26.5   Anesthesia Other Findings   Reproductive/Obstetrics negative OB ROS                             Anesthesia Physical Anesthesia Plan  ASA: IV  Anesthesia Plan: General   Post-op Pain Management:    Induction: Intravenous  PONV Risk Score and Plan: Treatment may vary due to age or medical condition  Airway Management Planned: LMA  Additional Equipment:   Intra-op Plan:   Post-operative Plan: Extubation in OR  Informed Consent: I have reviewed the patients History and Physical, chart, labs and discussed the procedure including the risks, benefits and alternatives for the proposed anesthesia with the patient or authorized representative who has indicated his/her understanding and acceptance.     Dental  advisory given  Plan Discussed with: CRNA  Anesthesia Plan Comments:         Anesthesia Quick Evaluation

## 2020-03-12 NOTE — Anesthesia Procedure Notes (Signed)
Procedure Name: LMA Insertion Date/Time: 03/12/2020 2:00 PM Performed by: Griffin Dakin, CRNA Pre-anesthesia Checklist: Patient identified, Emergency Drugs available, Suction available and Patient being monitored Patient Re-evaluated:Patient Re-evaluated prior to induction Oxygen Delivery Method: Circle system utilized Preoxygenation: Pre-oxygenation with 100% oxygen Induction Type: IV induction LMA: LMA inserted LMA Size: 5.0 Number of attempts: 1 Placement Confirmation: positive ETCO2 and breath sounds checked- equal and bilateral Tube secured with: Tape Dental Injury: Teeth and Oropharynx as per pre-operative assessment

## 2020-03-12 NOTE — Progress Notes (Signed)
Patient underwent primary resection right index finger in the operating room today.  Patient also had intraoperative cultures taken from the flexor sheath that had some cloudy fluid.  Wound was closed over a Penrose drain.  I will need to see the patient next week for dressing change.  Would continue antibiotics until white count normalizes.  Antibiotics and pain control as per internal medicine service.

## 2020-03-12 NOTE — Anesthesia Postprocedure Evaluation (Signed)
Anesthesia Post Note  Patient: [name]  Procedure(s) Performed: AMPUTATION RIGHT INDEX FINGER (Right Finger)     Patient location during evaluation: PACU Anesthesia Type: General Level of consciousness: awake and alert, oriented and patient cooperative Pain management: pain level controlled Vital Signs Assessment: post-procedure vital signs reviewed and stable Respiratory status: spontaneous breathing, nonlabored ventilation and respiratory function stable Cardiovascular status: blood pressure returned to baseline and stable Postop Assessment: no apparent nausea or vomiting Anesthetic complications: no    Last Vitals:  Vitals:   03/12/20 1634 03/12/20 1700  BP: (!) 150/61 (!) 155/98  Pulse: 69 69  Resp: 12 10  Temp:    SpO2: 94% 96%    Last Pain:  Vitals:   03/12/20 1634  TempSrc:   PainSc: [place]

## 2020-03-12 NOTE — Interval H&P Note (Signed)
History and Physical Interval Note:  03/12/2020 1:40 PM  [name]  has presented today for surgery, with the diagnosis of RIGHT INDEX FINGER NECROSIS.  The various methods of treatment have been discussed with the patient and family. After consideration of risks, benefits and other options for treatment, the patient has consented to  Procedure(s): AMPUTATION RIGHT INDEX FINGER (Right) as a surgical intervention.  The patient's history has been reviewed, patient examined, no change in status, stable for surgery.  I have reviewed the patient's chart and labs.  Questions were answered to the patient's satisfaction.     [name]

## 2020-03-12 NOTE — Progress Notes (Signed)
[facility] Progress Note   Subjective:  Seen in room, for surgery this afternoon  Objective Vitals:   03/12/20 1100 03/12/20 1131 03/12/20 1132 03/12/20 1138  BP: (!) 97/46 (!) 50/24 (!) 50/24 113/78  Pulse: 66  70 67  Resp: 10  12 11   Temp:      TempSrc:      SpO2:   97% 97%  Weight:      Height:        Weight change:     Physical Exam General: WNWD male, nad  Lungs: Breathing unlabored. CTA bilaterally Heart: RRR with S1 S2 Abdomen: soft non-tender, non-distended  Lower extremities: Bilat BKAs No stump edema Upper extremities: Right hand with necrotic index finder s/p amp distal phalanx  Dialysis Access: LUE AVF +bruit   Additional Objective Labs: Basic Metabolic Panel: Recent Labs  Lab 03/09/20 2130 03/09/20 2130 03/10/20 0313 03/11/20 0402 03/12/20 0410  NA 137   < > 136 139 140  K 3.8   < > 3.8 4.1 4.1  CL 93*  --   --  98 98  CO2 22  --   --  24 23  GLUCOSE 96  --   --  168* 139*  BUN 67*  --   --  45* 66*  CREATININE 12.46*  --   --  8.71* 11.44*  CALCIUM 7.8*  --   --  8.2* 8.1*   < > = values in this interval not displayed.   CBC: Recent Labs  Lab 03/09/20 2130 03/10/20 0313 03/11/20 0402  WBC 13.1*  --  10.2  NEUTROABS  --   --  6.9  HGB 8.8* 9.9* 8.4*  HCT 26.9* 29.0* 26.5*  MCV 76.0*  --  77.7*  PLT 280  --  238   Blood Culture    Component Value Date/Time   SDES BLOOD RIGHT HAND 03/09/2020 2328   SPECREQUEST  03/09/2020 2328    BOTTLES DRAWN AEROBIC AND ANAEROBIC Blood Culture results may not be optimal due to an excessive volume of blood received in culture bottles   CULT  03/09/2020 2328    NO GROWTH 2 DAYS Performed at [facility], [place] [address] [place], [place] [address]    REPTSTATUS PENDING 03/09/2020 2328    Medications: . [MAR Hold] sodium chloride    . [MAR Hold] sodium chloride    . sodium chloride    . sodium chloride 10 mL/hr at 03/12/20 1330  . [MAR Hold] ceFEPime (MAXIPIME) IV    .  [MAR Hold] vancomycin     . [MAR Hold] carvedilol  12.5 mg Oral BID WC  . [MAR Hold] Chlorhexidine Gluconate Cloth  6 each Topical Q0600  . [MAR Hold] darbepoetin (ARANESP) injection - DIALYSIS  100 mcg Intravenous Q Wed-HD  . [MAR Hold] gabapentin  100 mg Oral QHS  . [MAR Hold] heparin  4,600 Units Dialysis Once in dialysis  . [MAR Hold] heparin injection (subcutaneous)  5,000 Units Subcutaneous Q8H  . [MAR Hold] insulin aspart  0-5 Units Subcutaneous QHS  . [MAR Hold] insulin aspart  0-6 Units Subcutaneous TID WC  . [MAR Hold] sevelamer carbonate  2,400 mg Oral TID WC    Dialysis Orders:  [facility] MWF  4h 20min   425/800   98kg   2K/2.5Ca bath    AVF    Hep 4600 Mircera 100 (due 4/7) Venofer 50 Calcitriol 1.75   Assessment/Plan: 1. Fever/leukocytosis on admission. COVID negative. Blood cultures neg  to date. Antibiotics per primary  2. Nonhealing R finger ulcer s/p recent amp - going to surgery this afternoon 3. ESRD -  HD MWF. HD will be postponed to Sat due to surgery today 4. Hypertension/volume  - BP evated. Continue home meds. No volume excess on exam. UF as tolerated. Under edw.  5. Anemia  - Hgb 8.4 Aranesp 100 with HD 4/7.  6. Metabolic bone disease -  Continue binders/VDRA.  7. Nutrition - Renal diet/vitamins  8. DM - per primary   [name], MD 03/12/2020, 3:53 PM

## 2020-03-12 NOTE — Brief Op Note (Signed)
03/09/2020 - 03/12/2020  3:23 PM  PATIENT:  [name]  59 y.o. male  PRE-OPERATIVE DIAGNOSIS:  RIGHT INDEX FINGER NECROSIS  POST-OPERATIVE DIAGNOSIS:  RIGHT INDEX FINGER NECROSIS  PROCEDURE:  Procedure(s): AMPUTATION RIGHT INDEX FINGER (Right)  SURGEON:  Surgeon(s) and Role:    * Charlotte Crumb, MD - Primary  PHYSICIAN ASSISTANT: Leverne Humbles PA-C  ASSISTANTS:    ANESTHESIA:   general  EBL: 100 cc BLOOD ADMINISTERED:none  DRAINS: Penrose drain in the Hand   LOCAL MEDICATIONS USED:  MARCAINE     SPECIMEN:  Biopsy / Limited Resection  DISPOSITION OF SPECIMEN:  PATHOLOGY  COUNTS:  YES  TOURNIQUET:  * Missing tourniquet times found for documented tourniquets in log: [id number] *  DICTATION: .Dragon Dictation  PLAN OF CARE: Admit to inpatient   PATIENT DISPOSITION:  PACU - hemodynamically stable.   Delay start of Pharmacological VTE agent (>24hrs) due to surgical blood loss or risk of bleeding: yes

## 2020-03-12 NOTE — Progress Notes (Signed)
PROGRESS NOTE    [name]  INO:[id number] DOB: 01-13-1961 DOA: 03/09/2020 PCP: [name], FNP    Brief Narrative:  59 year old gentleman with history of ESRD on hemodialysis, MWF schedule, type 2 diabetes on insulin, GERD, hepatitis C, hypertension, severe peripheral vascular disease status post bilateral below-knee amputations, peripheral vascular disease and recent right index finger infection with interphalangeal amputation presented from home with lethargy and altered mental status.  Wife reported that he was incoherent and slurring his speech.  He was very somnolent.  Patient also taking oxycodone every 6 hours for pain.  Patient also has history of ischemia of the hands.  In the emergency room, febrile with temperature 100.7.  Blood pressure stable.  Matter-of-fact elevated.  WBC count 13.1.  Lactate normal.  COVID-19 negative.  Chest x-ray no acute findings.  Gangrenous right index finger with x-ray evidence of destruction of bone.  Started on broad-spectrum antibiotics and admitted.   Assessment & Plan:   Principal Problem:   Acute metabolic encephalopathy Active Problems:   HTN (hypertension)   Type 2 diabetes mellitus (HCC)   ESRD (end stage renal disease) ([place])   Fever   Sepsis ([place])  Sepsis present on admission, resolving  Probable primary source from right finger with extended infection.  Blood cultures collected.  COVID-19 negative.  Continue vancomycin and cefepime.   Seen by hand surgery, plan for right index finger ray amputation today. Hoping for surgical resection to healthy margins and we can discontinue antibiotics.  Continue for next 24 hours.  Acute metabolic infective encephalopathy: No focal deficit.  Improved.   Hypertension: Accelerated.  Resumed home medications including beta-blockers.  Also on as needed blood pressure medications.  Type 2 diabetes on insulin, uncontrolled with hypoglycemia: Patient had hypoglycemic episodes in the morning  of admission due to not eating.  Improving.  Hold long-acting insulin today as he is prolonged n.p.o.  Keep on sliding scale insulin.  We will put him back on long acting insulin once he comes back from surgery and starts eating.  ESRD on hemodialysis: On a schedule hemodialysis.  DVT prophylaxis: Subcu heparin Code Status: Full code Family Communication: Patient. Disposition Plan: patient is from home. Anticipated DC to home, Barriers to discharge admitted with sepsis, going for surgery today.   Consultants:   Nephrology  Hand surgery  Procedures:   None  Antimicrobials:  Anti-infectives (From admission, onward)   Start     Dose/Rate Route Frequency Ordered Stop   03/12/20 1200  [MAR Hold]  ceFEPIme (MAXIPIME) 2 g in sodium chloride 0.9 % 100 mL IVPB     (MAR Hold since Fri 03/12/2020 at 1320.Hold Reason: Transfer to a Procedural area.)   2 g 200 mL/hr over 30 Minutes Intravenous Every M-W-F (Hemodialysis) 03/10/20 0249     03/12/20 1200  [MAR Hold]  vancomycin (VANCOCIN) IVPB 1000 mg/200 mL premix     (MAR Hold since Fri 03/12/2020 at 1320.Hold Reason: Transfer to a Procedural area.)   1,000 mg 200 mL/hr over 60 Minutes Intravenous Every M-W-F (Hemodialysis) 03/10/20 0249     03/12/20 0600  ceFAZolin (ANCEF) IVPB 2g/100 mL premix     2 g 200 mL/hr over 30 Minutes Intravenous On call to O.R. 03/11/20 1843 03/12/20 1407   03/11/20 0145  ceFEPIme (MAXIPIME) 2 g in sodium chloride 0.9 % 100 mL IVPB     2 g 200 mL/hr over 30 Minutes Intravenous NOW 03/11/20 0131 03/11/20 0235   03/11/20 0145  vancomycin (VANCOCIN) IVPB  1000 mg/200 mL premix     1,000 mg 200 mL/hr over 60 Minutes Intravenous  Once 03/11/20 0132 03/11/20 0348   03/10/20 1200  ceFEPIme (MAXIPIME) 2 g in sodium chloride 0.9 % 100 mL IVPB  Status:  Discontinued     2 g 200 mL/hr over 30 Minutes Intravenous Every M-W-F (Hemodialysis) 03/10/20 0245 03/10/20 0249   03/10/20 1200  vancomycin (VANCOCIN) IVPB 1000 mg/200 mL  premix  Status:  Discontinued     1,000 mg 200 mL/hr over 60 Minutes Intravenous Every M-W-F (Hemodialysis) 03/10/20 0245 03/10/20 0249   03/10/20 0800  metroNIDAZOLE (FLAGYL) IVPB 500 mg  Status:  Discontinued     500 mg 100 mL/hr over 60 Minutes Intravenous Every 8 hours 03/10/20 0210 03/11/20 0937   03/10/20 0030  metroNIDAZOLE (FLAGYL) IVPB 500 mg     500 mg 100 mL/hr over 60 Minutes Intravenous  Once 03/10/20 0022 03/10/20 0336   03/10/20 0015  ceFEPIme (MAXIPIME) 2 g in sodium chloride 0.9 % 100 mL IVPB     2 g 200 mL/hr over 30 Minutes Intravenous NOW 03/10/20 0001 03/10/20 0354   03/10/20 0015  vancomycin (VANCOREADY) IVPB 2000 mg/400 mL     2,000 mg 200 mL/hr over 120 Minutes Intravenous NOW 03/10/20 0001 03/10/20 0536         Subjective: Seen and examined.  No overnight events.  I examined him before going for surgery.  He had no questions.  He knew that he is going to have surgery and he will lose his index finger.  Objective: Vitals:   03/12/20 1100 03/12/20 1131 03/12/20 1132 03/12/20 1138  BP: (!) 97/46 (!) 50/24 (!) 50/24 113/78  Pulse: 66  70 67  Resp: 10  12 11   Temp:      TempSrc:      SpO2:   97% 97%  Weight:      Height:        Intake/Output Summary (Last 24 hours) at 03/12/2020 1427 Last data filed at 03/12/2020 0900 Gross per 24 hour  Intake 360 ml  Output --  Net 360 ml   Filed Weights   03/09/20 2115 03/10/20 1845 03/11/20 0601  Weight: 100 kg 95.8 kg 96 kg    Examination:  General exam: Appears calm and comfortable,  Respiratory system: Clear to auscultation. Respiratory effort normal.  Patient has a permacath on his left chest. Cardiovascular system: S1 & S2 heard, RRR. No JVD, murmurs, rubs, gallops or clicks. Gastrointestinal system: Abdomen is distended but not tender , soft and nontender. No organomegaly or masses felt. Normal bowel sounds heard. Central nervous system: Alert and oriented. No focal neurological deficits.  Extremities:  Symmetric 5 x 5 power.  Can move all extremities equally. Psychiatry: Judgement and insight appear normal. Mood & affect appropriate.  Bilateral below-knee amputation, left below-knee amputation stump clean and dry.  Right below-knee amputation stump with irregularity, no open wound. Right index finger is gangrenous, swelling to the base of the index finger, sutures intact.   Data Reviewed: I have personally reviewed following labs and imaging studies  CBC: Recent Labs  Lab 03/09/20 2130 03/10/20 0313 03/11/20 0402  WBC 13.1*  --  10.2  NEUTROABS  --   --  6.9  HGB 8.8* 9.9* 8.4*  HCT 26.9* 29.0* 26.5*  MCV 76.0*  --  77.7*  PLT 280  --  732   Basic Metabolic Panel: Recent Labs  Lab 03/09/20 2130 03/09/20 2134 03/10/20 0313 03/11/20 0402 03/12/20  0410  NA 137  --  136 139 140  K 3.8  --  3.8 4.1 4.1  CL 93*  --   --  98 98  CO2 22  --   --  24 23  GLUCOSE 96  --   --  168* 139*  BUN 67*  --   --  45* 66*  CREATININE 12.46*  --   --  8.71* 11.44*  CALCIUM 7.8*  --   --  8.2* 8.1*  MG  --  2.4  --  2.0  --    GFR: Estimated Creatinine Clearance: 8.3 mL/min (A) (by C-G formula based on SCr of 11.44 mg/dL (H)). Liver Function Tests: Recent Labs  Lab 03/09/20 2130 03/11/20 0402 03/12/20 0410  AST 107* 59* 47*  ALT 169* 106* 76*  ALKPHOS 93 67 62  BILITOT 1.0 1.0 0.7  PROT 8.1 8.1 7.8  ALBUMIN 3.3* 3.0* 2.9*   No results for input(s): LIPASE, AMYLASE in the last 168 hours. Recent Labs  Lab 03/10/20 0359  AMMONIA 43*   Coagulation Profile: Recent Labs  Lab 03/09/20 2134  INR 1.2   Cardiac Enzymes: No results for input(s): CKTOTAL, CKMB, CKMBINDEX, TROPONINI in the last 168 hours. BNP (last 3 results) No results for input(s): PROBNP in the last 8760 hours. HbA1C: Recent Labs    03/10/20 0344  HGBA1C 7.5*   CBG: Recent Labs  Lab 03/11/20 1655 03/11/20 2129 03/12/20 0725 03/12/20 1131 03/12/20 1338  GLUCAP 153* 150* 148* 140* 105*   Lipid  Profile: No results for input(s): CHOL, HDL, LDLCALC, TRIG, CHOLHDL, LDLDIRECT in the last 72 hours. Thyroid Function Tests: Recent Labs    03/10/20 0344  TSH 2.680   Anemia Panel: Recent Labs    03/10/20 0344  VITAMINB12 3,251*   Sepsis Labs: Recent Labs  Lab 03/09/20 2228 03/10/20 0047  LATICACIDVEN 1.2 1.4    Recent Results (from the past 240 hour(s))  Blood Culture (routine x 2)     Status: None (Preliminary result)   Collection Time: 03/09/20  9:34 PM   Specimen: BLOOD RIGHT FOREARM  Result Value Ref Range Status   Specimen Description BLOOD RIGHT FOREARM  Final   Special Requests   Final    BOTTLES DRAWN AEROBIC AND ANAEROBIC Blood Culture results may not be optimal due to an inadequate volume of blood received in culture bottles   Culture   Final    NO GROWTH 3 DAYS Performed at [facility], [place] [address], [place], [place] [address]    Report Status PENDING  Incomplete  Blood Culture (routine x 2)     Status: None (Preliminary result)   Collection Time: 03/09/20 11:28 PM   Specimen: BLOOD RIGHT HAND  Result Value Ref Range Status   Specimen Description BLOOD RIGHT HAND  Final   Special Requests   Final    BOTTLES DRAWN AEROBIC AND ANAEROBIC Blood Culture results may not be optimal due to an excessive volume of blood received in culture bottles   Culture   Final    NO GROWTH 2 DAYS Performed at [facility], [place] [address] [place],  [address]    Report Status PENDING  Incomplete  SARS CORONAVIRUS 2 (TAT 6-24 HRS) Nasopharyngeal Nasopharyngeal Swab     Status: None   Collection Time: 03/10/20  2:53 AM   Specimen: Nasopharyngeal Swab  Result Value Ref Range Status   SARS Coronavirus 2 NEGATIVE NEGATIVE Final    Comment: (  NOTE) SARS-CoV-2 target nucleic acids are NOT DETECTED. The SARS-CoV-2 RNA is generally detectable in upper and lower respiratory specimens during the acute phase of infection. Negative results do not preclude  SARS-CoV-2 infection, do not rule out co-infections with other pathogens, and should not be used as the sole basis for treatment or other patient management decisions. Negative results must be combined with clinical observations, patient history, and epidemiological information. The expected result is Negative. Fact Sheet for Patients: SugarRoll.be Fact Sheet for Healthcare Providers: https://www.woods-mathews.com/ This test is not yet approved or cleared by the [place] FDA and  has been authorized for detection and/or diagnosis of SARS-CoV-2 by FDA under an Emergency Use Authorization (EUA). This EUA will remain  in effect (meaning this test can be used) for the duration of the COVID-19 declaration under Section 56 4(b)(1) of the Act, 21 U.S.C. section 360bbb-3(b)(1), unless the authorization is terminated or revoked sooner. Performed at [facility], [place] [address], [place], [place] [address]          Radiology Studies: No results found.      Scheduled Meds: . [MAR Hold] carvedilol  12.5 mg Oral BID WC  . [MAR Hold] Chlorhexidine Gluconate Cloth  6 each Topical Q0600  . [MAR Hold] darbepoetin (ARANESP) injection - DIALYSIS  100 mcg Intravenous Q Wed-HD  . [MAR Hold] gabapentin  100 mg Oral QHS  . [MAR Hold] heparin  4,600 Units Dialysis Once in dialysis  . [MAR Hold] heparin injection (subcutaneous)  5,000 Units Subcutaneous Q8H  . [MAR Hold] insulin aspart  0-5 Units Subcutaneous QHS  . [MAR Hold] insulin aspart  0-6 Units Subcutaneous TID WC  . [MAR Hold] sevelamer carbonate  2,400 mg Oral TID WC   Continuous Infusions: . [MAR Hold] sodium chloride    . [MAR Hold] sodium chloride    . sodium chloride    . sodium chloride 10 mL/hr at 03/12/20 1330  . [MAR Hold] ceFEPime (MAXIPIME) IV    . [MAR Hold] vancomycin       LOS: 2 days    Time spent: 30 minutes    Barb Merino, MD [facility] Pager  [phone]

## 2020-03-12 NOTE — Transfer of Care (Signed)
Immediate Anesthesia Transfer of Care Note  Patient: [name]  Procedure(s) Performed: AMPUTATION RIGHT INDEX FINGER (Right Finger)  Patient Location: PACU  Anesthesia Type:General  Level of Consciousness: drowsy  Airway & Oxygen Therapy: Patient Spontanous Breathing and Patient connected to face mask oxygen  Post-op Assessment: Report given to RN and Post -op Vital signs reviewed and stable  Post vital signs: Reviewed and stable  Last Vitals:  Vitals Value Taken Time  BP 107/61 03/12/20 1551  Temp    Pulse 73 03/12/20 1553  Resp 17 03/12/20 1553  SpO2 100 % 03/12/20 1553  Vitals shown include unvalidated device data.  Last Pain:  Vitals:   03/12/20 0727  TempSrc: Oral  PainSc: 8       Patients Stated Pain Goal: 2 (37/54/36 0677)  Complications: No apparent anesthesia complications

## 2020-03-12 NOTE — Progress Notes (Signed)
Patient complaining of 10/10 surgical pain.  Oxycodone ineffective.  Notified on call Jeannette Corpus, NP.  Will continue to monitor the patient

## 2020-03-13 LAB — COMPREHENSIVE METABOLIC PANEL
ALT: 47 U/L — ABNORMAL HIGH (ref 0–44)
AST: 35 U/L (ref 15–41)
Albumin: 2.8 g/dL — ABNORMAL LOW (ref 3.5–5.0)
Alkaline Phosphatase: 56 U/L (ref 38–126)
Anion gap: 20 — ABNORMAL HIGH (ref 5–15)
BUN: 73 mg/dL — ABNORMAL HIGH (ref 8–23)
CO2: 21 mmol/L — ABNORMAL LOW (ref 22–32)
Calcium: 7.7 mg/dL — ABNORMAL LOW (ref 8.9–10.3)
Chloride: 97 mmol/L — ABNORMAL LOW (ref 98–111)
Creatinine, Ser: 12.72 mg/dL — ABNORMAL HIGH (ref 0.61–1.24)
GFR calc Af Amer: 4 mL/min — ABNORMAL LOW (ref >60)
GFR calc non Af Amer: 4 mL/min — ABNORMAL LOW (ref >60)
Glucose, Bld: 142 mg/dL — ABNORMAL HIGH (ref 70–99)
Potassium: 5.1 mmol/L (ref 3.5–5.1)
Sodium: 138 mmol/L (ref 135–145)
Total Bilirubin: 0.6 mg/dL (ref 0.3–1.2)
Total Protein: 7.8 g/dL (ref 6.5–8.1)

## 2020-03-13 LAB — GLUCOSE, CAPILLARY
Glucose-Capillary: 154 mg/dL — ABNORMAL HIGH (ref 70–99)
Glucose-Capillary: 165 mg/dL — ABNORMAL HIGH (ref 70–99)
Glucose-Capillary: 165 mg/dL — ABNORMAL HIGH (ref 70–99)

## 2020-03-13 LAB — PROCALCITONIN: Procalcitonin: >150 ng/mL

## 2020-03-13 MED ORDER — OXYCODONE-ACETAMINOPHEN 5-325 MG PO TABS
ORAL_TABLET | ORAL | Status: AC
  Filled 2020-03-13: qty 2

## 2020-03-13 MED ORDER — VANCOMYCIN HCL IN DEXTROSE 1-5 GM/200ML-% IV SOLN
1000.0000 mg | Freq: Once | INTRAVENOUS | Status: AC
  Administered 2020-03-13: 1000 mg via INTRAVENOUS
  Filled 2020-03-13 (×3): qty 200

## 2020-03-13 MED ORDER — AMLODIPINE BESYLATE 10 MG PO TABS
10.0000 mg | ORAL_TABLET | Freq: Every day | ORAL | Status: DC
  Administered 2020-03-14 – 2020-03-16 (×2): 10 mg via ORAL
  Filled 2020-03-13 (×2): qty 1

## 2020-03-13 MED ORDER — OXYCODONE-ACETAMINOPHEN 5-325 MG PO TABS
2.0000 | ORAL_TABLET | ORAL | Status: DC | PRN
  Administered 2020-03-13 – 2020-03-16 (×14): 2 via ORAL
  Filled 2020-03-13 (×14): qty 2

## 2020-03-13 MED ORDER — SODIUM CHLORIDE 0.9 % IV SOLN
2.0000 g | Freq: Once | INTRAVENOUS | Status: AC
  Administered 2020-03-13: 2 g via INTRAVENOUS
  Filled 2020-03-13: qty 2

## 2020-03-13 NOTE — Progress Notes (Signed)
PROGRESS NOTE    [name]  TFT:[id number] DOB: 11-Nov-1961 DOA: 03/09/2020 PCP: [name], FNP    Brief Narrative:  59 year old gentleman with history of ESRD on hemodialysis, MWF schedule, type 2 diabetes on insulin, GERD, hepatitis C, hypertension, severe peripheral vascular disease status post bilateral below-knee amputations, peripheral vascular disease and recent right index finger infection with interphalangeal amputation presented from home with lethargy and altered mental status.  Wife reported that he was incoherent and slurring his speech.  He was very somnolent.  Patient also taking oxycodone every 6 hours for pain.  Patient also has history of ischemia of the hands.  In the emergency room, febrile with temperature 100.7.  Blood pressure stable.  Matter-of-fact elevated.  WBC count 13.1.  Lactate normal.  COVID-19 negative.  Chest x-ray no acute findings.  Gangrenous right index finger with x-ray evidence of destruction of bone.  Started on broad-spectrum antibiotics and admitted.   Assessment & Plan:   Sepsis present on admission, resolving  - Probable primary source from right finger with extended infection.  Blood cultures so far negative, continue empiric antibiotics, underwent right hand index finger amputation by orthopedics on 03/12/2020.  Continue close monitoring and follow cultures.  Sepsis pathophysiology has resolved.  Preliminary wound cultures growing gram-negative rods.  Acute metabolic infective encephalopathy: No focal deficit.  Improved.   Hypertension: Pressure running high on home dose beta-blocker, add Norvasc and monitor.  ESRD on hemodialysis: Nephrology following on MWF schedule.  Type 2 diabetes on insulin, uncontrolled with hypoglycemia: Is on sliding scale will monitor and adjust, initially had hypoglycemic events in the hospital.  Lab Results  Component Value Date   HGBA1C 7.5 (H) 03/10/2020   CBG (last 3)  Recent Labs     03/12/20 2135 03/13/20 0759 03/13/20 1211  GLUCAP 191* 154* 165*      DVT prophylaxis: Subcu heparin Code Status: Full code Family Communication: wife - [phone],  message left for 09/22/2020 at 2:30 PM Disposition Plan: Likely home, still undergoing treatment for right hand infection requiring finger amputation this admission on 03/12/2020, still on IV antibiotics.   Consultants:   Nephrology  Hand surgery  Procedures:   None  Antimicrobials:   Anti-infectives (From admission, onward)   Start     Dose/Rate Route Frequency Ordered Stop   03/12/20 1200  ceFEPIme (MAXIPIME) 2 g in sodium chloride 0.9 % 100 mL IVPB     2 g 200 mL/hr over 30 Minutes Intravenous Every M-W-F (Hemodialysis) 03/10/20 0249     03/12/20 1200  vancomycin (VANCOCIN) IVPB 1000 mg/200 mL premix     1,000 mg 200 mL/hr over 60 Minutes Intravenous Every M-W-F (Hemodialysis) 03/10/20 0249     03/12/20 0600  ceFAZolin (ANCEF) IVPB 2g/100 mL premix     2 g 200 mL/hr over 30 Minutes Intravenous On call to O.R. 03/11/20 1843 03/12/20 1407   03/11/20 0145  ceFEPIme (MAXIPIME) 2 g in sodium chloride 0.9 % 100 mL IVPB     2 g 200 mL/hr over 30 Minutes Intravenous NOW 03/11/20 0131 03/11/20 0235   03/11/20 0145  vancomycin (VANCOCIN) IVPB 1000 mg/200 mL premix     1,000 mg 200 mL/hr over 60 Minutes Intravenous  Once 03/11/20 0132 03/11/20 0348   03/10/20 1200  ceFEPIme (MAXIPIME) 2 g in sodium chloride 0.9 % 100 mL IVPB  Status:  Discontinued     2 g 200 mL/hr over 30 Minutes Intravenous Every M-W-F (Hemodialysis) 03/10/20 0245 03/10/20 1517  03/10/20 1200  vancomycin (VANCOCIN) IVPB 1000 mg/200 mL premix  Status:  Discontinued     1,000 mg 200 mL/hr over 60 Minutes Intravenous Every M-W-F (Hemodialysis) 03/10/20 0245 03/10/20 0249   03/10/20 0800  metroNIDAZOLE (FLAGYL) IVPB 500 mg  Status:  Discontinued     500 mg 100 mL/hr over 60 Minutes Intravenous Every 8 hours 03/10/20 0210 03/11/20 0937    03/10/20 0030  metroNIDAZOLE (FLAGYL) IVPB 500 mg     500 mg 100 mL/hr over 60 Minutes Intravenous  Once 03/10/20 0022 03/10/20 0336   03/10/20 0015  ceFEPIme (MAXIPIME) 2 g in sodium chloride 0.9 % 100 mL IVPB     2 g 200 mL/hr over 30 Minutes Intravenous NOW 03/10/20 0001 03/10/20 0354   03/10/20 0015  vancomycin (VANCOREADY) IVPB 2000 mg/400 mL     2,000 mg 200 mL/hr over 120 Minutes Intravenous NOW 03/10/20 0001 03/10/20 0536      Subjective:  Patient in bed, appears comfortable, denies any headache, no fever, no chest pain or pressure, no shortness of breath , no abdominal pain. No focal weakness.   Objective: Vitals:   03/13/20 0000 03/13/20 0428 03/13/20 0800 03/13/20 1200  BP: (!) 170/58 137/63 (!) 143/63 (!) 155/94  Pulse: 78 94 91 91  Resp: 10 16 13    Temp: 98.1 F (36.7 C) 99.8 F (37.7 C) 99 F (37.2 C) 99.2 F (37.3 C)  TempSrc: Oral Oral Oral Oral  SpO2: 97% 96% 96% 96%  Weight:      Height:        Intake/Output Summary (Last 24 hours) at 03/13/2020 1428 Last data filed at 03/13/2020 1149 Gross per 24 hour  Intake 947.83 ml  Output 610 ml  Net 337.83 ml   Filed Weights   03/09/20 2115 03/10/20 1845 03/11/20 0601  Weight: 100 kg 95.8 kg 96 kg    Examination:  Awake Alert, No new F.N deficits, Normal affect Benson.AT,PERRAL Supple Neck,No JVD, No cervical lymphadenopathy appriciated.  Symmetrical Chest wall movement, Good air movement bilaterally, CTAB RRR,No Gallops, Rubs or new Murmurs, No Parasternal Heave +ve B.Sounds, Abd Soft, No tenderness, No organomegaly appriciated, No rebound - guarding or rigidity. Right below-knee amputation stump with irregularity, no open wound. Right index finger is gangrenous, swelling to the base of the index finger, sutures intact.   Data Reviewed: I have personally reviewed following labs and imaging studies  CBC: Recent Labs  Lab 03/09/20 2130 03/10/20 0313 03/11/20 0402  WBC 13.1*  --  10.2  NEUTROABS  --    --  6.9  HGB 8.8* 9.9* 8.4*  HCT 26.9* 29.0* 26.5*  MCV 76.0*  --  77.7*  PLT 280  --  785   Basic Metabolic Panel: Recent Labs  Lab 03/09/20 2130 03/09/20 2134 03/10/20 0313 03/11/20 0402 03/12/20 0410 03/13/20 0421  NA 137  --  136 139 140 138  K 3.8  --  3.8 4.1 4.1 5.1  CL 93*  --   --  98 98 97*  CO2 22  --   --  24 23 21*  GLUCOSE 96  --   --  168* 139* 142*  BUN 67*  --   --  45* 66* 73*  CREATININE 12.46*  --   --  8.71* 11.44* 12.72*  CALCIUM 7.8*  --   --  8.2* 8.1* 7.7*  MG  --  2.4  --  2.0  --   --    GFR: Estimated Creatinine Clearance:  7.5 mL/min (A) (by C-G formula based on SCr of 12.72 mg/dL (H)). Liver Function Tests: Recent Labs  Lab 03/09/20 2130 03/11/20 0402 03/12/20 0410 03/13/20 0421  AST 107* 59* 47* 35  ALT 169* 106* 76* 47*  ALKPHOS 93 67 62 56  BILITOT 1.0 1.0 0.7 0.6  PROT 8.1 8.1 7.8 7.8  ALBUMIN 3.3* 3.0* 2.9* 2.8*   No results for input(s): LIPASE, AMYLASE in the last 168 hours. Recent Labs  Lab 03/10/20 0359  AMMONIA 43*   Coagulation Profile: Recent Labs  Lab 03/09/20 2134  INR 1.2   Cardiac Enzymes: No results for input(s): CKTOTAL, CKMB, CKMBINDEX, TROPONINI in the last 168 hours. BNP (last 3 results) No results for input(s): PROBNP in the last 8760 hours. HbA1C: No results for input(s): HGBA1C in the last 72 hours. CBG: Recent Labs  Lab 03/12/20 1338 03/12/20 1653 03/12/20 2135 03/13/20 0759 03/13/20 1211  GLUCAP 105* 138* 191* 154* 165*   Lipid Profile: No results for input(s): CHOL, HDL, LDLCALC, TRIG, CHOLHDL, LDLDIRECT in the last 72 hours. Thyroid Function Tests: No results for input(s): TSH, T4TOTAL, FREET4, T3FREE, THYROIDAB in the last 72 hours. Anemia Panel: No results for input(s): VITAMINB12, FOLATE, FERRITIN, TIBC, IRON, RETICCTPCT in the last 72 hours. Sepsis Labs: Recent Labs  Lab 03/09/20 2228 03/10/20 0047  LATICACIDVEN 1.2 1.4    Recent Results (from the past 240 hour(s))  Blood  Culture (routine x 2)     Status: None (Preliminary result)   Collection Time: 03/09/20  9:34 PM   Specimen: BLOOD RIGHT FOREARM  Result Value Ref Range Status   Specimen Description BLOOD RIGHT FOREARM  Final   Special Requests   Final    BOTTLES DRAWN AEROBIC AND ANAEROBIC Blood Culture results may not be optimal due to an inadequate volume of blood received in culture bottles   Culture   Final    NO GROWTH 4 DAYS Performed at [facility], [place] [address] [place], [place] [address]    Report Status PENDING  Incomplete  Blood Culture (routine x 2)     Status: None (Preliminary result)   Collection Time: 03/09/20 11:28 PM   Specimen: BLOOD RIGHT HAND  Result Value Ref Range Status   Specimen Description BLOOD RIGHT HAND  Final   Special Requests   Final    BOTTLES DRAWN AEROBIC AND ANAEROBIC Blood Culture results may not be optimal due to an excessive volume of blood received in culture bottles   Culture   Final    NO GROWTH 3 DAYS Performed at [facility], [place] [address], [place], [place] [address]    Report Status PENDING  Incomplete  SARS CORONAVIRUS 2 (TAT 6-24 HRS) Nasopharyngeal Nasopharyngeal Swab     Status: None   Collection Time: 03/10/20  2:53 AM   Specimen: Nasopharyngeal Swab  Result Value Ref Range Status   SARS Coronavirus 2 NEGATIVE NEGATIVE Final    Comment: (NOTE) SARS-CoV-2 target nucleic acids are NOT DETECTED. The SARS-CoV-2 RNA is generally detectable in upper and lower respiratory specimens during the acute phase of infection. Negative results do not preclude SARS-CoV-2 infection, do not rule out co-infections with other pathogens, and should not be used as the sole basis for treatment or other patient management decisions. Negative results must be combined with clinical observations, patient history, and epidemiological information. The expected result is Negative. Fact Sheet for Patients: SugarRoll.be Fact  Sheet for Healthcare Providers: https://www.woods-mathews.com/ This test is not yet approved  or cleared by the [place] and  has been authorized for detection and/or diagnosis of SARS-CoV-2 by FDA under an Emergency Use Authorization (EUA). This EUA will remain  in effect (meaning this test can be used) for the duration of the COVID-19 declaration under Section 56 4(b)(1) of the Act, 21 U.S.C. section 360bbb-3(b)(1), unless the authorization is terminated or revoked sooner. Performed at [facility], [place] [address], [place], [place] [address]   Aerobic/Anaerobic Culture (surgical/deep wound)     Status: None (Preliminary result)   Collection Time: 03/12/20  2:21 PM   Specimen: PATH Other; Tissue  Result Value Ref Range Status   Specimen Description FINGER RIGHT  Final   Special Requests RT FLEXOR SHEATH PT ON ANCEF  Final   Gram Stain   Final    NO WBC SEEN RARE GRAM NEGATIVE RODS Performed at [facility], [address], [place], [place] [address]    Culture FEW GRAM NEGATIVE RODS  Final   Report Status PENDING  Incomplete     Radiology Studies: No results found.  Scheduled Meds: . carvedilol  12.5 mg Oral BID WC  . Chlorhexidine Gluconate Cloth  6 each Topical Q0600  . Chlorhexidine Gluconate Cloth  6 each Topical Q0600  . darbepoetin (ARANESP) injection - DIALYSIS  100 mcg Intravenous Q Wed-HD  . gabapentin  100 mg Oral QHS  . heparin  4,600 Units Dialysis Once in dialysis  . heparin injection (subcutaneous)  5,000 Units Subcutaneous Q8H  . insulin aspart  0-5 Units Subcutaneous QHS  . insulin aspart  0-6 Units Subcutaneous TID WC  . sevelamer carbonate  2,400 mg Oral TID WC   Continuous Infusions: . sodium chloride    . sodium chloride    . sodium chloride 10 mL/hr at 03/12/20 1859  . ceFEPime (MAXIPIME) IV    . vancomycin       LOS: 3 days    Time spent: 30 minutes  Signature  Lala Lund M.D on 03/13/2020 at 2:28 PM   -   To page go to [email or web address]

## 2020-03-13 NOTE — Progress Notes (Signed)
[facility] Progress Note  Subjective: Seen in room, had finger amputation surgery yesterday, a little groggy but fully Ox 3  Vitals:   03/13/20 0000 03/13/20 0428 03/13/20 0800 03/13/20 1200  BP: (!) 170/58 137/63 (!) 143/63 (!) 155/94  Pulse: 78 94 91 91  Resp: 10 16 13    Temp: 98.1 F (36.7 C) 99.8 F (37.7 C) 99 F (37.2 C) 99.2 F (37.3 C)  TempSrc: Oral Oral Oral Oral  SpO2: 97% 96% 96% 96%  Weight:      Height:       Physical Exam General: WNWD male, nad  Lungs: Breathing unlabored. CTA bilaterally Heart: RRR with S1 S2 Abdomen: soft non-tender, non-distended  Lower extremities: Bilat BKAs No stump edema Upper extremities: Right hand in large dressing Dialysis Access: LUE AVF +bruit    Dialysis Orders:  GO MWF  4h 47min   425/800   98kg   2K/2.5Ca bath    AVF    Hep 4600 Mircera 100 (due 4/7) Venofer 50 Calcitriol 1.75   Assessment/Plan: 1. Fever/leukocytosis on admission. COVID negative. Blood cultures neg to date. Antibiotics per primary  2. 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 ESRD -  HD MWF. HD today.  4. Hypertension/volume  - BP evated. Continue home meds. No volume excess on exam. UF as tolerated. Under edw.  5. Anemia  - Hgb 8.4 Aranesp 100 with HD 4/7.  6. Metabolic bone disease -  Continue binders/VDRA.  7. Nutrition - Renal diet/vitamins  8. DM - per primary    Rob Brealynn Contino 03/13/2020, 6:45 PM   Recent Labs  Lab 03/09/20 2130 03/10/20 0313 03/11/20 0402 03/11/20 0402 03/12/20 0410 03/13/20 0421  K   < > 3.8 4.1   < > 4.1 5.1  BUN   < >  --  45*   < > 66* 73*  CREATININE   < >  --  8.71*   < > 11.44* 12.72*  CALCIUM   < >  --  8.2*   < > 8.1* 7.7*  HGB  --  9.9* 8.4*  --   --   --    < > = values in this interval not displayed.   Inpatient medications: . amLODipine  10 mg Oral Daily  . carvedilol  12.5 mg Oral BID WC  . Chlorhexidine Gluconate Cloth  6 each Topical Q0600  . Chlorhexidine Gluconate  Cloth  6 each Topical Q0600  . darbepoetin (ARANESP) injection - DIALYSIS  100 mcg Intravenous Q Wed-HD  . gabapentin  100 mg Oral QHS  . heparin  4,600 Units Dialysis Once in dialysis  . heparin injection (subcutaneous)  5,000 Units Subcutaneous Q8H  . insulin aspart  0-5 Units Subcutaneous QHS  . insulin aspart  0-6 Units Subcutaneous TID WC  . oxyCODONE-acetaminophen      . sevelamer carbonate  2,400 mg Oral TID WC   . sodium chloride    . sodium chloride    . sodium chloride 10 mL/hr at 03/12/20 1859  . ceFEPime (MAXIPIME) IV    . ceFEPime (MAXIPIME) IV    . vancomycin    . vancomycin     sodium chloride, sodium chloride, acetaminophen **OR** acetaminophen, alteplase, heparin, heparin, hydrALAZINE, lidocaine (PF), lidocaine-prilocaine, oxyCODONE-acetaminophen, pentafluoroprop-tetrafluoroeth

## 2020-03-14 LAB — CULTURE, BLOOD (ROUTINE X 2): Culture: NO GROWTH

## 2020-03-14 LAB — D-DIMER, QUANTITATIVE: D-Dimer, Quant: 1.74 ug/mL-FEU — ABNORMAL HIGH (ref 0.00–0.50)

## 2020-03-14 LAB — BASIC METABOLIC PANEL
Anion gap: 16 — ABNORMAL HIGH (ref 5–15)
BUN: 39 mg/dL — ABNORMAL HIGH (ref 8–23)
CO2: 24 mmol/L (ref 22–32)
Calcium: 8.2 mg/dL — ABNORMAL LOW (ref 8.9–10.3)
Chloride: 97 mmol/L — ABNORMAL LOW (ref 98–111)
Creatinine, Ser: 8.18 mg/dL — ABNORMAL HIGH (ref 0.61–1.24)
GFR calc Af Amer: 7 mL/min — ABNORMAL LOW (ref >60)
GFR calc non Af Amer: 6 mL/min — ABNORMAL LOW (ref >60)
Glucose, Bld: 133 mg/dL — ABNORMAL HIGH (ref 70–99)
Potassium: 4.5 mmol/L (ref 3.5–5.1)
Sodium: 137 mmol/L (ref 135–145)

## 2020-03-14 LAB — CBC
HCT: 22.9 % — ABNORMAL LOW (ref 39.0–52.0)
Hemoglobin: 7.1 g/dL — ABNORMAL LOW (ref 13.0–17.0)
MCH: 24.9 pg — ABNORMAL LOW (ref 26.0–34.0)
MCHC: 31 g/dL (ref 30.0–36.0)
MCV: 80.4 fL (ref 80.0–100.0)
Platelets: 339 10*3/uL (ref 150–400)
RBC: 2.85 MIL/uL — ABNORMAL LOW (ref 4.22–5.81)
RDW: 18.2 % — ABNORMAL HIGH (ref 11.5–15.5)
WBC: 18.9 10*3/uL — ABNORMAL HIGH (ref 4.0–10.5)
nRBC: 0 % (ref 0.0–0.2)

## 2020-03-14 LAB — GLUCOSE, CAPILLARY
Glucose-Capillary: 126 mg/dL — ABNORMAL HIGH (ref 70–99)
Glucose-Capillary: 173 mg/dL — ABNORMAL HIGH (ref 70–99)
Glucose-Capillary: 134 mg/dL — ABNORMAL HIGH (ref 70–99)
Glucose-Capillary: 139 mg/dL — ABNORMAL HIGH (ref 70–99)

## 2020-03-14 LAB — C-REACTIVE PROTEIN: CRP: 15.9 mg/dL — ABNORMAL HIGH (ref <1.0)

## 2020-03-14 LAB — MAGNESIUM: Magnesium: 2 mg/dL (ref 1.7–2.4)

## 2020-03-14 LAB — PROCALCITONIN: Procalcitonin: 89.14 ng/mL

## 2020-03-14 LAB — BRAIN NATRIURETIC PEPTIDE: B Natriuretic Peptide: 344 pg/mL — ABNORMAL HIGH (ref 0.0–100.0)

## 2020-03-14 MED ORDER — CHLORHEXIDINE GLUCONATE CLOTH 2 % EX PADS: 6.0000 | MEDICATED_PAD | Freq: Every day | CUTANEOUS | Status: DC

## 2020-03-14 MED ORDER — GABAPENTIN 100 MG PO CAPS
100.0000 mg | ORAL_CAPSULE | Freq: Two times a day (BID) | ORAL | Status: DC
  Administered 2020-03-14 – 2020-03-16 (×5): 100 mg via ORAL
  Filled 2020-03-14 (×5): qty 1

## 2020-03-14 MED ORDER — MORPHINE SULFATE ER 15 MG PO TBCR
30.0000 mg | EXTENDED_RELEASE_TABLET | Freq: Two times a day (BID) | ORAL | Status: DC
  Administered 2020-03-14 – 2020-03-15 (×4): 30 mg via ORAL
  Filled 2020-03-14 (×4): qty 2

## 2020-03-14 MED ORDER — CARVEDILOL 25 MG PO TABS
25.0000 mg | ORAL_TABLET | Freq: Two times a day (BID) | ORAL | Status: DC
  Administered 2020-03-14 – 2020-03-16 (×3): 25 mg via ORAL
  Filled 2020-03-14 (×3): qty 1

## 2020-03-14 NOTE — Progress Notes (Signed)
PROGRESS NOTE    [name]  ELF:[id number] DOB: 1961/12/02 DOA: 03/09/2020 PCP: [name], FNP    Brief Narrative:  60 year old gentleman with history of ESRD on hemodialysis, MWF schedule, type 2 diabetes on insulin, GERD, hepatitis C, hypertension, severe peripheral vascular disease status post bilateral below-knee amputations, peripheral vascular disease and recent right index finger infection with interphalangeal amputation presented from home with lethargy and altered mental status.  Wife reported that he was incoherent and slurring his speech.  He was very somnolent.  Patient also taking oxycodone every 6 hours for pain.  Patient also has history of ischemia of the hands.  In the emergency room, febrile with temperature 100.7.  Blood pressure stable.  Matter-of-fact elevated.  WBC count 13.1.  Lactate normal.  COVID-19 negative.  Chest x-ray no acute findings.  Gangrenous right index finger with x-ray evidence of destruction of bone.  Started on broad-spectrum antibiotics and admitted.   Assessment & Plan:   Sepsis present on admission, resolving  - Probable primary source from right finger with extended infection.  Blood cultures so far negative, continue empiric antibiotics, underwent right hand index finger amputation by orthopedics on 03/12/2020.  Continue close monitoring and follow cultures.  Sepsis pathophysiology has resolved.  Preliminary wound cultures growing Klebsiella, will check MRSA nasal screen if it is negative will discontinue vancomycin and continue Maxipime only.  Acute metabolic infective encephalopathy: No focal deficit.  Improved.   Hypertension: Pressure running high on Norvasc and beta-blocker combination dose adjusted for better control will continue to monitor.  ESRD on hemodialysis: Nephrology following on MWF schedule.  Ongoing postop hand pain.  Requiring quite a bit of narcotics, long-acting narcotic added for better control.  I am questioning  if he is developing some pseudoaddiction to narcotics.  Discussed with his wife and updated her as well.  Will monitor closely.    Type 2 diabetes on insulin, uncontrolled with hypoglycemia: Is on sliding scale will monitor and adjust, initially had hypoglycemic events in the hospital.  Lab Results  Component Value Date   HGBA1C 7.5 (H) 03/10/2020   CBG (last 3)  Recent Labs    03/13/20 1211 03/13/20 2109 03/14/20 0809  GLUCAP 165* 165* 126*     DVT prophylaxis: Subcu heparin Code Status: Full code Family Communication: wife - [phone],  message left for 09/22/2020 at 2:30 PM, updated 03/14/20 Disposition Plan: Likely home, still undergoing treatment for right hand infection requiring finger amputation this admission on 03/12/2020, still on IV antibiotics.   Consultants:   Nephrology  Hand surgery  Procedures:   None  Antimicrobials:   Anti-infectives (From admission, onward)   Start     Dose/Rate Route Frequency Ordered Stop   03/13/20 2100  vancomycin (VANCOCIN) IVPB 1000 mg/200 mL premix     1,000 mg 200 mL/hr over 60 Minutes Intravenous  Once 03/13/20 1512 03/13/20 2229   03/13/20 2000  ceFEPIme (MAXIPIME) 2 g in sodium chloride 0.9 % 100 mL IVPB     2 g 200 mL/hr over 30 Minutes Intravenous  Once 03/13/20 1512 03/13/20 2103   03/12/20 1200  ceFEPIme (MAXIPIME) 2 g in sodium chloride 0.9 % 100 mL IVPB     2 g 200 mL/hr over 30 Minutes Intravenous Every M-W-F (Hemodialysis) 03/10/20 0249     03/12/20 1200  vancomycin (VANCOCIN) IVPB 1000 mg/200 mL premix     1,000 mg 200 mL/hr over 60 Minutes Intravenous Every M-W-F (Hemodialysis) 03/10/20 0249     03/12/20  0600  ceFAZolin (ANCEF) IVPB 2g/100 mL premix     2 g 200 mL/hr over 30 Minutes Intravenous On call to O.R. 03/11/20 1843 03/12/20 1407   03/11/20 0145  ceFEPIme (MAXIPIME) 2 g in sodium chloride 0.9 % 100 mL IVPB     2 g 200 mL/hr over 30 Minutes Intravenous NOW 03/11/20 0131 03/11/20 0235   03/11/20  0145  vancomycin (VANCOCIN) IVPB 1000 mg/200 mL premix     1,000 mg 200 mL/hr over 60 Minutes Intravenous  Once 03/11/20 0132 03/11/20 0348   03/10/20 1200  ceFEPIme (MAXIPIME) 2 g in sodium chloride 0.9 % 100 mL IVPB  Status:  Discontinued     2 g 200 mL/hr over 30 Minutes Intravenous Every M-W-F (Hemodialysis) 03/10/20 0245 03/10/20 0249   03/10/20 1200  vancomycin (VANCOCIN) IVPB 1000 mg/200 mL premix  Status:  Discontinued     1,000 mg 200 mL/hr over 60 Minutes Intravenous Every M-W-F (Hemodialysis) 03/10/20 0245 03/10/20 0249   03/10/20 0800  metroNIDAZOLE (FLAGYL) IVPB 500 mg  Status:  Discontinued     500 mg 100 mL/hr over 60 Minutes Intravenous Every 8 hours 03/10/20 0210 03/11/20 0937   03/10/20 0030  metroNIDAZOLE (FLAGYL) IVPB 500 mg     500 mg 100 mL/hr over 60 Minutes Intravenous  Once 03/10/20 0022 03/10/20 0336   03/10/20 0015  ceFEPIme (MAXIPIME) 2 g in sodium chloride 0.9 % 100 mL IVPB     2 g 200 mL/hr over 30 Minutes Intravenous NOW 03/10/20 0001 03/10/20 0354   03/10/20 0015  vancomycin (VANCOREADY) IVPB 2000 mg/400 mL     2,000 mg 200 mL/hr over 120 Minutes Intravenous NOW 03/10/20 0001 03/10/20 0536      Subjective:  Patient in bed, appears comfortable and in no distress, still wants more pain medication, denies any chest or abdominal pain but postop site hand pain positive according to him.  No focal weakness.   Objective: Vitals:   03/14/20 0604 03/14/20 0631 03/14/20 0800 03/14/20 0814  BP:   (!) 154/38 (!) 160/86  Pulse:  78  83  Resp:  13 (!) 23 10  Temp:    98.5 F (36.9 C)  TempSrc:    Oral  SpO2:  95%  99%  Weight: 100 kg     Height:        Intake/Output Summary (Last 24 hours) at 03/14/2020 1104 Last data filed at 03/14/2020 0900 Gross per 24 hour  Intake 240 ml  Output 900 ml  Net -660 ml   Filed Weights   03/10/20 1845 03/11/20 0601 03/14/20 0604  Weight: 95.8 kg 96 kg 100 kg    Examination:  Awake Alert, No new F.N deficits,  Normal affect [place].AT,PERRAL Supple Neck,No JVD, No cervical lymphadenopathy appriciated.  Symmetrical Chest wall movement, Good air movement bilaterally, CTAB RRR,No Gallops, Rubs or new Murmurs, No Parasternal Heave +ve B.Sounds, Abd Soft, No tenderness, No organomegaly appriciated, No rebound - guarding or rigidity. Bilat below-knee amputation stump with irregularity, no open wound. Right index finger is gangrenous, swelling to the base of the index finger, sutures intact.   Data Reviewed: I have personally reviewed following labs and imaging studies  CBC: Recent Labs  Lab 03/09/20 2130 03/10/20 0313 03/11/20 0402 03/14/20 0812  WBC 13.1*  --  10.2 18.9*  NEUTROABS  --   --  6.9  --   HGB 8.8* 9.9* 8.4* 7.1*  HCT 26.9* 29.0* 26.5* 22.9*  MCV 76.0*  --  77.7* 80.4  PLT 280  --  238 400   Basic Metabolic Panel: Recent Labs  Lab 03/09/20 2130 03/09/20 2130 03/09/20 2134 03/10/20 0313 03/11/20 0402 03/12/20 0410 03/13/20 0421 03/14/20 0812  NA 137   < >  --  136 139 140 138 137  K 3.8   < >  --  3.8 4.1 4.1 5.1 4.5  CL 93*  --   --   --  98 98 97* 97*  CO2 22  --   --   --  24 23 21* 24  GLUCOSE 96  --   --   --  168* 139* 142* 133*  BUN 67*  --   --   --  45* 66* 73* 39*  CREATININE 12.46*  --   --   --  8.71* 11.44* 12.72* 8.18*  CALCIUM 7.8*  --   --   --  8.2* 8.1* 7.7* 8.2*  MG  --   --  2.4  --  2.0  --   --  2.0   < > = values in this interval not displayed.   GFR: Estimated Creatinine Clearance: 11.6 mL/min (A) (by C-G formula based on SCr of 8.18 mg/dL (H)). Liver Function Tests: Recent Labs  Lab 03/09/20 2130 03/11/20 0402 03/12/20 0410 03/13/20 0421  AST 107* 59* 47* 35  ALT 169* 106* 76* 47*  ALKPHOS 93 67 62 56  BILITOT 1.0 1.0 0.7 0.6  PROT 8.1 8.1 7.8 7.8  ALBUMIN 3.3* 3.0* 2.9* 2.8*   No results for input(s): LIPASE, AMYLASE in the last 168 hours. Recent Labs  Lab 03/10/20 0359  AMMONIA 43*   Coagulation Profile: Recent Labs  Lab  03/09/20 2134  INR 1.2   Cardiac Enzymes: No results for input(s): CKTOTAL, CKMB, CKMBINDEX, TROPONINI in the last 168 hours. BNP (last 3 results) No results for input(s): PROBNP in the last 8760 hours. HbA1C: No results for input(s): HGBA1C in the last 72 hours. CBG: Recent Labs  Lab 03/12/20 2135 03/13/20 0759 03/13/20 1211 03/13/20 2109 03/14/20 0809  GLUCAP 191* 154* 165* 165* 126*   Lipid Profile: No results for input(s): CHOL, HDL, LDLCALC, TRIG, CHOLHDL, LDLDIRECT in the last 72 hours. Thyroid Function Tests: No results for input(s): TSH, T4TOTAL, FREET4, T3FREE, THYROIDAB in the last 72 hours. Anemia Panel: No results for input(s): VITAMINB12, FOLATE, FERRITIN, TIBC, IRON, RETICCTPCT in the last 72 hours. Sepsis Labs: Recent Labs  Lab 03/09/20 2228 03/10/20 0047 03/13/20 0421 03/14/20 0458  PROCALCITON  --   --  >150.00 89.14  LATICACIDVEN 1.2 1.4  --   --     Recent Results (from the past 240 hour(s))  Blood Culture (routine x 2)     Status: None   Collection Time: 03/09/20  9:34 PM   Specimen: BLOOD RIGHT FOREARM  Result Value Ref Range Status   Specimen Description BLOOD RIGHT FOREARM  Final   Special Requests   Final    BOTTLES DRAWN AEROBIC AND ANAEROBIC Blood Culture results may not be optimal due to an inadequate volume of blood received in culture bottles   Culture   Final    NO GROWTH 5 DAYS Performed at [facility], [place] [address], [place], [place] [address]    Report Status 03/14/2020 FINAL  Final  Blood Culture (routine x 2)     Status: None (Preliminary result)   Collection Time: 03/09/20 11:28 PM   Specimen: BLOOD RIGHT HAND  Result Value Ref Range Status  Specimen Description BLOOD RIGHT HAND  Final   Special Requests   Final    BOTTLES DRAWN AEROBIC AND ANAEROBIC Blood Culture results may not be optimal due to an excessive volume of blood received in culture bottles   Culture   Final    NO GROWTH 4 DAYS Performed at [facility], [place] [address], [place], [place] [address]    Report Status PENDING  Incomplete  SARS CORONAVIRUS 2 (TAT 6-24 HRS) Nasopharyngeal Nasopharyngeal Swab     Status: None   Collection Time: 03/10/20  2:53 AM   Specimen: Nasopharyngeal Swab  Result Value Ref Range Status   SARS Coronavirus 2 NEGATIVE NEGATIVE Final    Comment: (NOTE) SARS-CoV-2 target nucleic acids are NOT DETECTED. The SARS-CoV-2 RNA is generally detectable in upper and lower respiratory specimens during the acute phase of infection. Negative results do not preclude SARS-CoV-2 infection, do not rule out co-infections with other pathogens, and should not be used as the sole basis for treatment or other patient management decisions. Negative results must be combined with clinical observations, patient history, and epidemiological information. The expected result is Negative. Fact Sheet for Patients: SugarRoll.be Fact Sheet for Healthcare Providers: https://www.woods-mathews.com/ This test is not yet approved or cleared by the [place] FDA and  has been authorized for detection and/or diagnosis of SARS-CoV-2 by FDA under an Emergency Use Authorization (EUA). This EUA will remain  in effect (meaning this test can be used) for the duration of the COVID-19 declaration under Section 56 4(b)(1) of the Act, 21 U.S.C. section 360bbb-3(b)(1), unless the authorization is terminated or revoked sooner. Performed at [facility], [place] [address] [place], [place] [address]   Aerobic/Anaerobic Culture (surgical/deep wound)     Status: None (Preliminary result)   Collection Time: 03/12/20  2:21 PM   Specimen: PATH Other; Tissue  Result Value Ref Range Status   Specimen Description FINGER RIGHT  Final   Special Requests RT FLEXOR SHEATH PT ON ANCEF  Final   Gram Stain   Final    NO WBC SEEN RARE GRAM NEGATIVE RODS Performed at [facility], [address],  [place], [place] [address]    Culture FEW KLEBSIELLA OXYTOCA  Final   Report Status PENDING  Incomplete     Radiology Studies: No results found.  Scheduled Meds: . amLODipine  10 mg Oral Daily  . carvedilol  25 mg Oral BID WC  . Chlorhexidine Gluconate Cloth  6 each Topical Q0600  . Chlorhexidine Gluconate Cloth  6 each Topical Q0600  . darbepoetin (ARANESP) injection - DIALYSIS  100 mcg Intravenous Q Wed-HD  . gabapentin  100 mg Oral BID  . heparin  4,600 Units Dialysis Once in dialysis  . heparin injection (subcutaneous)  5,000 Units Subcutaneous Q8H  . insulin aspart  0-5 Units Subcutaneous QHS  . insulin aspart  0-6 Units Subcutaneous TID WC  . morphine  30 mg Oral Q12H  . sevelamer carbonate  2,400 mg Oral TID WC   Continuous Infusions: . sodium chloride Stopped (03/14/20 0019)  . ceFEPime (MAXIPIME) IV    . vancomycin       LOS: 4 days    Time spent: 30 minutes  Signature  Lala Lund M.D on 03/14/2020 at 11:04 AM   -  To page go to [email or web address]

## 2020-03-14 NOTE — Evaluation (Addendum)
Physical Therapy Evaluation Patient Details Name: [name] MRN: [id number] DOB: 1961/02/19 Today's Date: 03/14/2020   History of Present Illness  59 year old gentleman with history of ESRD on hemodialysis, MWF schedule, type 2 diabetes on insulin, GERD, hepatitis C, hypertension, severe peripheral vascular disease status post bilateral below-knee amputations, peripheral vascular disease and recent right index finger infection with interphalangeal amputation presented from home with lethargy and altered mental status. Found to have gangrenous right index finger now s/p amputation.   Clinical Impression  Pt presents with decreased functional right hand use, right hand pain, balance impairments, decreased skin integrity and decreased activity tolerance. Ambulating 100 feet at a min assist level, with left handheld external support, and prosthetics donned. Could potentially benefit from R platform walker for further independence and safety. Will reassess. Recommending OPPT at discharge for further prosthetic gait training, evaluation of fit and balance training.     Follow Up Recommendations Supervision for mobility/OOB;Outpatient PT ([facility])    Equipment Recommendations  Other (comment)(RUE platform walker)    Recommendations for Other Services       Precautions / Restrictions Precautions Precautions: Fall;Other (comment) Precaution Comments: bilat BKA Restrictions Weight Bearing Restrictions: No      Mobility  Bed Mobility Overal bed mobility: Modified Independent                Transfers Overall transfer level: Needs assistance Equipment used: None Transfers: Sit to/from Stand Sit to Stand: Min guard         General transfer comment: Min guard from elevated bed height  Ambulation/Gait Ambulation/Gait assistance: Min assist Gait Distance (Feet): 80 Feet Assistive device: 1 person hand held assist Gait Pattern/deviations: Step-through  pattern;Decreased stride length Gait velocity: decreased Gait velocity interpretation: <1.31 ft/sec, indicative of household ambulator General Gait Details: Pt with short step length, knee instability, and slow speed. L HHA provided for external support  Stairs            Wheelchair Mobility    Modified Rankin (Stroke Patients Only)       Balance Overall balance assessment: Needs assistance Sitting-balance support: Feet unsupported Sitting balance-Leahy Scale: Good     Standing balance support: Single extremity supported;During functional activity Standing balance-Leahy Scale: Fair                               Pertinent Vitals/Pain Pain Assessment: Faces Faces Pain Scale: Hurts little more Pain Location: left hand Pain Descriptors / Indicators: Grimacing;Guarding Pain Intervention(s): Limited activity within patient's tolerance;Monitored during session;Premedicated before session    Home Living Family/patient expects to be discharged to:: Private residence Living Arrangements: Spouse/significant other Available Help at Discharge: Family Type of Home: House Home Access: Level entry     Home Layout: One level Home Equipment: Environmental consultant - 2 wheels;Bedside commode;Cane - quad;Crutches;Wheelchair - manual;Tub bench      Prior Function Level of Independence: Independent         Comments: Furniture walks with household ambulation; reports increased fatigue after dialysis days.     Hand Dominance        Extremity/Trunk Assessment   Upper Extremity Assessment Upper Extremity Assessment: RUE deficits/detail RUE Deficits / Details: S/p 2nd finger amputation. Able to wiggle 1, 3-5 fingers although stiff. Reports decreased senation. RUE Sensation: decreased light touch    Lower Extremity Assessment Lower Extremity Assessment: RLE deficits/detail;[name] deficits/detail RLE Deficits / Details: BKA, ROM WFL [name] Deficits / Details: BKA, ROM [facility]  Cervical / Trunk Assessment Cervical / Trunk Assessment: Normal  Communication   Communication: No difficulties  Cognition Arousal/Alertness: Awake/alert Behavior During Therapy: WFL for tasks assessed/performed Overall Cognitive Status: Within Functional Limits for tasks assessed                                 General Comments: A&Ox4      General Comments      Exercises     Assessment/Plan    PT Assessment Patient needs continued PT services  PT Problem List Decreased strength;Decreased range of motion;Decreased activity tolerance;Decreased balance;Decreased mobility;Pain       PT Treatment Interventions DME instruction;Gait training;Therapeutic activities;Therapeutic exercise;Functional mobility training;Balance training;Patient/family education    PT Goals (Current goals can be found in the Care Plan section)  Acute Rehab PT Goals Patient Stated Goal: walk more PT Goal Formulation: With patient Time For Goal Achievement: 03/28/20 Potential to Achieve Goals: Good    Frequency Min 3X/week   Barriers to discharge        Co-evaluation               AM-PAC PT "6 Clicks" Mobility  Outcome Measure Help needed turning from your back to your side while in a flat bed without using bedrails?: None Help needed moving from lying on your back to sitting on the side of a flat bed without using bedrails?: None Help needed moving to and from a bed to a chair (including a wheelchair)?: A Little Help needed standing up from a chair using your arms (e.g., wheelchair or bedside chair)?: A Little Help needed to walk in hospital room?: A Little Help needed climbing 3-5 steps with a railing? : A Lot 6 Click Score: 19    End of Session Equipment Utilized During Treatment: Gait belt;Other (comment)(prosthetics) Activity Tolerance: Patient tolerated treatment well Patient left: in bed;with call bell/phone within reach;with bed alarm set Nurse Communication:  Mobility status PT Visit Diagnosis: Unsteadiness on feet (R26.81);Other abnormalities of gait and mobility (R26.89);Difficulty in walking, not elsewhere classified (R26.2);Pain Pain - Right/Left: Left Pain - part of body: Hand    Time: 1441-1534 PT Time Calculation (min) (ACUTE ONLY): 53 min   Charges:   PT Evaluation $PT Eval Moderate Complexity: 1 Mod PT Treatments $Gait Training: 8-22 mins $Therapeutic Activity: 23-37 mins          Wyona Almas, PT, DPT Acute Rehabilitation Services Pager [phone] Office [phone]   Deno Etienne 03/14/2020, 5:08 PM

## 2020-03-14 NOTE — Progress Notes (Signed)
[facility] Progress Note  Subjective: Seen in room, doing better , pain a little better  Vitals:   03/14/20 0631 03/14/20 0800 03/14/20 0814 03/14/20 1200  BP:  (!) 154/38 (!) 160/86 (!) 152/47  Pulse: 78  83   Resp: 13 (!) 23 10 10   Temp:   98.5 F (36.9 C) 98.7 F (37.1 C)  TempSrc:   Oral Oral  SpO2: 95%  99%   Weight:      Height:       Physical Exam General: WNWD male, nad  Lungs: Breathing unlabored. CTA bilaterally Heart: RRR with S1 S2 Abdomen: soft non-tender, non-distended  Lower extremities: Bilat BKAs No stump edema Upper extremities: Right hand in large dressing Dialysis Access: LUE AVF +bruit    Dialysis Orders:  GO MWF  4h 62min   425/800   98kg   2K/2.5Ca bath    AVF    Hep 4600 Mircera 100 (due 4/7) Venofer 50 Calcitriol 1.75   Assessment/Plan: 1. Fever/leukocytosis on admission. COVID negative. Blood cultures neg to date. Antibiotics per primary  2. 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 ESRD -  HD MWF. HD tomorrow.  4. Hypertension/volume  - BP evated. Continue home meds. No volume excess on exam.  5. Anemia  - Hgb 8.4 Aranesp 100 with HD 4/7.  6. Metabolic bone disease -  Continue binders/VDRA.  7. Nutrition - Renal diet/vitamins  8. DM - per primary    Rob Lezette Kitts 03/14/2020, 1:49 PM   Recent Labs  Lab 03/11/20 0402 03/12/20 0410 03/13/20 0421 03/14/20 0812  K 4.1   < > 5.1 4.5  BUN 45*   < > 73* 39*  CREATININE 8.71*   < > 12.72* 8.18*  CALCIUM 8.2*   < > 7.7* 8.2*  HGB 8.4*  --   --  7.1*   < > = values in this interval not displayed.   Inpatient medications: . amLODipine  10 mg Oral Daily  . carvedilol  25 mg Oral BID WC  . Chlorhexidine Gluconate Cloth  6 each Topical Q0600  . Chlorhexidine Gluconate Cloth  6 each Topical Q0600  . darbepoetin (ARANESP) injection - DIALYSIS  100 mcg Intravenous Q Wed-HD  . gabapentin  100 mg Oral BID  . heparin  4,600 Units Dialysis Once in dialysis  .  heparin injection (subcutaneous)  5,000 Units Subcutaneous Q8H  . insulin aspart  0-5 Units Subcutaneous QHS  . insulin aspart  0-6 Units Subcutaneous TID WC  . morphine  30 mg Oral Q12H  . sevelamer carbonate  2,400 mg Oral TID WC   . sodium chloride Stopped (03/14/20 0019)  . ceFEPime (MAXIPIME) IV    . vancomycin     sodium chloride, acetaminophen **OR** [DISCONTINUED] acetaminophen, alteplase, heparin, heparin, hydrALAZINE, lidocaine (PF), lidocaine-prilocaine, oxyCODONE-acetaminophen, pentafluoroprop-tetrafluoroeth

## 2020-03-15 LAB — BASIC METABOLIC PANEL
Anion gap: 18 — ABNORMAL HIGH (ref 5–15)
BUN: 55 mg/dL — ABNORMAL HIGH (ref 8–23)
CO2: 24 mmol/L (ref 22–32)
Calcium: 8 mg/dL — ABNORMAL LOW (ref 8.9–10.3)
Chloride: 94 mmol/L — ABNORMAL LOW (ref 98–111)
Creatinine, Ser: 10.12 mg/dL — ABNORMAL HIGH (ref 0.61–1.24)
GFR calc Af Amer: 6 mL/min — ABNORMAL LOW (ref >60)
GFR calc non Af Amer: 5 mL/min — ABNORMAL LOW (ref >60)
Glucose, Bld: 124 mg/dL — ABNORMAL HIGH (ref 70–99)
Potassium: 4.2 mmol/L (ref 3.5–5.1)
Sodium: 136 mmol/L (ref 135–145)

## 2020-03-15 LAB — GLUCOSE, CAPILLARY
Glucose-Capillary: 116 mg/dL — ABNORMAL HIGH (ref 70–99)
Glucose-Capillary: 160 mg/dL — ABNORMAL HIGH (ref 70–99)
Glucose-Capillary: 141 mg/dL — ABNORMAL HIGH (ref 70–99)
Glucose-Capillary: 125 mg/dL — ABNORMAL HIGH (ref 70–99)

## 2020-03-15 LAB — CBC WITH DIFFERENTIAL/PLATELET
Abs Immature Granulocytes: 0.1 10*3/uL — ABNORMAL HIGH (ref 0.00–0.07)
Basophils Absolute: 0.1 10*3/uL (ref 0.0–0.1)
Basophils Relative: 1 %
Eosinophils Absolute: 0.5 10*3/uL (ref 0.0–0.5)
Eosinophils Relative: 3 %
HCT: 23.6 % — ABNORMAL LOW (ref 39.0–52.0)
Hemoglobin: 7.2 g/dL — ABNORMAL LOW (ref 13.0–17.0)
Immature Granulocytes: 1 %
Lymphocytes Relative: 14 %
Lymphs Abs: 2 10*3/uL (ref 0.7–4.0)
MCH: 24.5 pg — ABNORMAL LOW (ref 26.0–34.0)
MCHC: 30.5 g/dL (ref 30.0–36.0)
MCV: 80.3 fL (ref 80.0–100.0)
Monocytes Absolute: 2.1 10*3/uL — ABNORMAL HIGH (ref 0.1–1.0)
Monocytes Relative: 14 %
Neutro Abs: 10 10*3/uL — ABNORMAL HIGH (ref 1.7–7.7)
Neutrophils Relative %: 67 %
Platelets: 329 10*3/uL (ref 150–400)
RBC: 2.94 MIL/uL — ABNORMAL LOW (ref 4.22–5.81)
RDW: 17.9 % — ABNORMAL HIGH (ref 11.5–15.5)
WBC: 14.8 10*3/uL — ABNORMAL HIGH (ref 4.0–10.5)
nRBC: 0 % (ref 0.0–0.2)

## 2020-03-15 LAB — SURGICAL PATHOLOGY

## 2020-03-15 LAB — CULTURE, BLOOD (ROUTINE X 2): Culture: NO GROWTH

## 2020-03-15 LAB — BRAIN NATRIURETIC PEPTIDE: B Natriuretic Peptide: 246 pg/mL — ABNORMAL HIGH (ref 0.0–100.0)

## 2020-03-15 LAB — MAGNESIUM: Magnesium: 2.1 mg/dL (ref 1.7–2.4)

## 2020-03-15 LAB — TYPE AND SCREEN
ABO/RH(D): O POS
Antibody Screen: NEGATIVE

## 2020-03-15 LAB — C-REACTIVE PROTEIN: CRP: 17.6 mg/dL — ABNORMAL HIGH (ref <1.0)

## 2020-03-15 LAB — PROCALCITONIN: Procalcitonin: 58.13 ng/mL

## 2020-03-15 MED ORDER — HEPARIN SODIUM (PORCINE) 1000 UNIT/ML IJ SOLN
INTRAMUSCULAR | Status: AC
  Administered 2020-03-15: 2000 [IU] via INTRAVENOUS_CENTRAL
  Filled 2020-03-15: qty 2

## 2020-03-15 MED ORDER — HEPARIN SODIUM (PORCINE) 1000 UNIT/ML DIALYSIS: 2000.0000 [IU] | INTRAMUSCULAR | Status: DC | PRN

## 2020-03-15 MED ORDER — CEPHALEXIN 500 MG PO CAPS
500.0000 mg | ORAL_CAPSULE | Freq: Two times a day (BID) | ORAL | Status: DC
  Administered 2020-03-16: 500 mg via ORAL
  Filled 2020-03-15 (×2): qty 1

## 2020-03-15 MED ORDER — VANCOMYCIN HCL IN DEXTROSE 1-5 GM/200ML-% IV SOLN
INTRAVENOUS | Status: AC
  Filled 2020-03-15: qty 200

## 2020-03-15 MED ORDER — SODIUM CHLORIDE 0.9 % IV SOLN
2.0000 g | Freq: Once | INTRAVENOUS | Status: AC
  Administered 2020-03-15: 2 g via INTRAVENOUS
  Filled 2020-03-15 (×2): qty 20

## 2020-03-15 NOTE — Progress Notes (Signed)
PT Cancellation Note  Patient Details Name: [name] MRN: [id number] DOB: 28-Jun-1961   Cancelled Treatment:    Reason Eval/Treat Not Completed: Other (comment) Pt declined PT due to fatigue from HD.  He requested PT on non-HD days or prior to HD.  Pt gets HD on M, W, F.  Will f/u as able.  Made note in Rehab notes to reflect pt's preference of days. Maggie Font, PT Acute Rehab Services Pager [phone] [facility] [phone]   Karlton Lemon 03/15/2020, 3:49 PM

## 2020-03-15 NOTE — Procedures (Signed)
I was present at this dialysis session. I have reviewed the session itself and made appropriate changes.   Vital signs in last 24 hours:  Temp:  [98 F (36.7 C)-99 F (37.2 C)] 98.2 F (36.8 C) (04/12 0755) Pulse Rate:  [72-77] 76 (04/12 0755) Resp:  [10-19] 11 (04/12 0755) BP: (56-152)/(36-121) 116/76 (04/12 0755) SpO2:  [94 %-97 %] 96 % (04/12 0755) Weight change:  Filed Weights   03/10/20 1845 03/11/20 0601 03/14/20 0604  Weight: 95.8 kg 96 kg 100 kg    Recent Labs  Lab 03/15/20 0438  NA 136  K 4.2  CL 94*  CO2 24  GLUCOSE 124*  BUN 55*  CREATININE 10.12*  CALCIUM 8.0*    Recent Labs  Lab 03/11/20 0402 03/14/20 0812 03/15/20 0438  WBC 10.2 18.9* 14.8*  NEUTROABS 6.9  --  10.0*  HGB 8.4* 7.1* 7.2*  HCT 26.5* 22.9* 23.6*  MCV 77.7* 80.4 80.3  PLT 238 339 329    Scheduled Meds: . amLODipine  10 mg Oral Daily  . carvedilol  25 mg Oral BID WC  . Chlorhexidine Gluconate Cloth  6 each Topical Q0600  . darbepoetin (ARANESP) injection - DIALYSIS  100 mcg Intravenous Q Wed-HD  . gabapentin  100 mg Oral BID  . heparin      . heparin  4,600 Units Dialysis Once in dialysis  . heparin injection (subcutaneous)  5,000 Units Subcutaneous Q8H  . insulin aspart  0-5 Units Subcutaneous QHS  . insulin aspart  0-6 Units Subcutaneous TID WC  . morphine  30 mg Oral Q12H  . sevelamer carbonate  2,400 mg Oral TID WC   Continuous Infusions: . sodium chloride Stopped (03/14/20 0019)  . ceFEPime (MAXIPIME) IV    . vancomycin     PRN Meds:.sodium chloride, acetaminophen **OR** [DISCONTINUED] acetaminophen, alteplase, heparin, heparin, [START ON 03/16/2020] heparin, hydrALAZINE, lidocaine (PF), lidocaine-prilocaine, oxyCODONE-acetaminophen, pentafluoroprop-tetrafluoroeth     Dialysis Orders: GO MWF  4h 27min   425/800   98kg   2K/2.5Ca bath    AVF    Hep 4600 Mircera 100 (due 4/7) Venofer 50 Calcitriol 1.75  Assessment/Plan: 1. Fever/leukocytosis on admission. COVID  negative. Wound culture +Klebsiella.  Blood culturesneg to date. Antibiotics per primary 2. Gangrene R ring finger -sp R 4th digit amputation 4/9 3. ESRD -HD MWF. HD tomorrow.  4. Hypertension/volume - BP evated. Continue home meds. No volume excess on exam.  5. Anemia - Hgb 8.4 Aranesp 100 with HD 4/7. 6. Metabolic bone disease -Continue binders/VDRA. 7. Nutrition -Renal diet/vitamins  8. DM - per primary  Donetta Potts,  MD 03/15/2020, 8:33 AM

## 2020-03-15 NOTE — Plan of Care (Signed)

## 2020-03-15 NOTE — Progress Notes (Signed)
OT Cancellation Note  Patient Details Name: [name] MRN: [id number] DOB: 08/13/1961   Cancelled Treatment:    Reason Eval/Treat Not Completed: Patient at procedure or test/ unavailable(HD)  Malka So 03/15/2020, 9:53 AM  Nestor Lewandowsky, OTR/L Acute Rehabilitation Services Pager: [phone] Office: [phone]

## 2020-03-15 NOTE — Progress Notes (Signed)
Pharmacy Antibiotic Note  [name] is a 59 y.o. male admitted on 03/09/2020 with Klebsiella deep tissue infection of finger.  Pharmacy has been consulted for Ceftriaxone dosing - high dose requested by MD today then switch to oral Keflex starting 4/13. Patient is ESRD- HD MWF.   Plan: Ceftriaxone 2g IV  x1 today, then Keflex 500mg  po BID.  Pharmacy will sign off consult.  Please re-consult if needed.    Height: 6\' 4"  (193 cm) Weight: (P) 97.7 kg (215 lb 6.2 oz) IBW/kg (Calculated) : 86.8  Temp (24hrs), Avg:98.5 F (36.9 C), Min:98 F (36.7 C), Max:99 F (37.2 C)  Recent Labs  Lab 03/09/20 2130 03/09/20 2130 03/09/20 2228 03/10/20 0047 03/11/20 0402 03/12/20 0410 03/13/20 0421 03/14/20 0812 03/15/20 0438  WBC 13.1*  --   --   --  10.2  --   --  18.9* 14.8*  CREATININE 12.46*   < >  --   --  8.71* 11.44* 12.72* 8.18* 10.12*  LATICACIDVEN  --   --  1.2 1.4  --   --   --   --   --    < > = values in this interval not displayed.    Estimated Creatinine Clearance: 9.4 mL/min (A) (by C-G formula based on SCr of 10.12 mg/dL (H)).    No Known Allergies  Thank you for allowing pharmacy to be a part of this patient's care.  Brain Hilts 03/15/2020 10:09 AM

## 2020-03-15 NOTE — Progress Notes (Signed)
   03/15/20 2008  Assess: MEWS Score  Temp 98.2 F (36.8 C)  BP (!) 143/91  Pulse Rate 80  ECG Heart Rate 80  Resp 18  Level of Consciousness Alert  SpO2 96 %  O2 Device Room Air  Patient Activity (if Appropriate) In bed  Assess: MEWS Score  MEWS Temp 0  MEWS Systolic 0  MEWS Pulse 0  MEWS RR 0  MEWS LOC 0  MEWS Score 0  MEWS Score Color Green  Assess: if the MEWS score is Yellow or Red  Were vital signs taken at a resting state? Yes  Focused Assessment Documented focused assessment  Early Detection of Sepsis Score *See Row Information* High  MEWS guidelines implemented *See Row Information* No, other (Comment) (no acute changes)

## 2020-03-15 NOTE — Progress Notes (Addendum)
PROGRESS NOTE    [name]  UQJ:[id number] DOB: 06/13/1961 DOA: 03/09/2020 PCP: [name], FNP    Brief Narrative:  59 year old gentleman with history of ESRD on hemodialysis, MWF schedule, type 2 diabetes on insulin, GERD, hepatitis C, hypertension, severe peripheral vascular disease status post bilateral below-knee amputations, peripheral vascular disease and recent right index finger infection with interphalangeal amputation presented from home with lethargy and altered mental status.  Wife reported that he was incoherent and slurring his speech.  He was very somnolent.  Patient also taking oxycodone every 6 hours for pain.  Patient also has history of ischemia of the hands.  In the emergency room, febrile with temperature 100.7.  Blood pressure stable.  Matter-of-fact elevated.  WBC count 13.1.  Lactate normal.  COVID-19 negative.  Chest x-ray no acute findings.  Gangrenous right index finger with x-ray evidence of destruction of bone.  Started on broad-spectrum antibiotics and admitted.   Assessment & Plan:   Sepsis present on admission, resolving  - Probable primary source from right finger with extended infection.  Blood cultures so far negative, continue empiric antibiotics, underwent right hand index finger amputation by orthopedics on 03/12/2020.  Continue close monitoring and follow cultures.  Sepsis pathophysiology has resolved.  Nasal MRSA screen negative, blood cultures negative, wound culture growing Klebsiella which appears to be pansensitive, right index finger has been removed and case discussed with Dr. Karie Soda surgeon who states that there might be some mild soft tissue infection, will switch him to oral Keflex for another 10 days after few days of IV Rocephin.  Discussed with ID physician Dr. Alton Revere on 03/15/2020.  Per hand surgeon dressing changes will be done by him in the office.  Acute metabolic infective encephalopathy: Due to sepsis now  resolved.  Hypertension: Pressure running high on Norvasc and beta-blocker combination dose adjusted for better control will continue to monitor.  ESRD on hemodialysis: Nephrology following on MWF schedule.  Ongoing postop hand pain.  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    Type 2 diabetes on insulin, uncontrolled with hypoglycemia: Is on sliding scale will monitor and adjust, initially had hypoglycemic events in the hospital.  Lab Results  Component Value Date   HGBA1C 7.5 (H) 03/10/2020   CBG (last 3)  Recent Labs    03/14/20 1556 03/14/20 2137 03/15/20 0753  GLUCAP 134* 139* 116*     DVT prophylaxis: Subcu heparin  Code Status: Full code  Family Communication: wife - [phone],  message left for 09/22/2020 at 2:30 PM, updated 03/14/20, 03/15/20 - 9.54 am message left.  Disposition Plan: Likely home, still undergoing treatment for right hand infection requiring finger amputation this admission on 03/12/2020, still on IV antibiotics.   Consultants:   Nephrology  Hand surgery  Procedures:  AMPUTATION RIGHT INDEX FINGER (Right)   Antimicrobials:   Anti-infectives (From admission, onward)   Start     Dose/Rate Route Frequency Ordered Stop   03/13/20 2100  vancomycin (VANCOCIN) IVPB 1000 mg/200 mL premix     1,000 mg 200 mL/hr over 60 Minutes Intravenous  Once 03/13/20 1512 03/13/20 2229   03/13/20 2000  ceFEPIme (MAXIPIME) 2 g in sodium chloride 0.9 % 100 mL IVPB     2 g 200 mL/hr over 30 Minutes Intravenous  Once 03/13/20 1512 03/13/20 2103   03/12/20 1200  ceFEPIme (MAXIPIME) 2  g in sodium chloride 0.9 % 100 mL IVPB  Status:  Discontinued     2 g 200 mL/hr over 30 Minutes Intravenous Every M-W-F (Hemodialysis) 03/10/20 0249 03/15/20 0924   03/12/20 1200  vancomycin (VANCOCIN) IVPB 1000  mg/200 mL premix  Status:  Discontinued     1,000 mg 200 mL/hr over 60 Minutes Intravenous Every M-W-F (Hemodialysis) 03/10/20 0249 03/15/20 0924   03/12/20 0600  ceFAZolin (ANCEF) IVPB 2g/100 mL premix     2 g 200 mL/hr over 30 Minutes Intravenous On call to O.R. 03/11/20 1843 03/12/20 1407   03/11/20 0145  ceFEPIme (MAXIPIME) 2 g in sodium chloride 0.9 % 100 mL IVPB     2 g 200 mL/hr over 30 Minutes Intravenous NOW 03/11/20 0131 03/11/20 0235   03/11/20 0145  vancomycin (VANCOCIN) IVPB 1000 mg/200 mL premix     1,000 mg 200 mL/hr over 60 Minutes Intravenous  Once 03/11/20 0132 03/11/20 0348   03/10/20 1200  ceFEPIme (MAXIPIME) 2 g in sodium chloride 0.9 % 100 mL IVPB  Status:  Discontinued     2 g 200 mL/hr over 30 Minutes Intravenous Every M-W-F (Hemodialysis) 03/10/20 0245 03/10/20 0249   03/10/20 1200  vancomycin (VANCOCIN) IVPB 1000 mg/200 mL premix  Status:  Discontinued     1,000 mg 200 mL/hr over 60 Minutes Intravenous Every M-W-F (Hemodialysis) 03/10/20 0245 03/10/20 0249   03/10/20 0800  metroNIDAZOLE (FLAGYL) IVPB 500 mg  Status:  Discontinued     500 mg 100 mL/hr over 60 Minutes Intravenous Every 8 hours 03/10/20 0210 03/11/20 0937   03/10/20 0030  metroNIDAZOLE (FLAGYL) IVPB 500 mg     500 mg 100 mL/hr over 60 Minutes Intravenous  Once 03/10/20 0022 03/10/20 0336   03/10/20 0015  ceFEPIme (MAXIPIME) 2 g in sodium chloride 0.9 % 100 mL IVPB     2 g 200 mL/hr over 30 Minutes Intravenous NOW 03/10/20 0001 03/10/20 0354   03/10/20 0015  vancomycin (VANCOREADY) IVPB 2000 mg/400 mL     2,000 mg 200 mL/hr over 120 Minutes Intravenous NOW 03/10/20 0001 03/10/20 0536      Subjective:  Patient in bed, appears comfortable and in no distress, still wants more pain medication, denies any chest or abdominal pain but postop site hand pain positive according to him.  No focal weakness.   Objective: Vitals:   03/15/20 0837 03/15/20 0846 03/15/20 0900 03/15/20 0930  BP: (!) (P)  159/73 (!) (P) 158/58 (!) (P) 148/66 (!) (P) 94/54  Pulse: (P) 74 (P) 73 (P) 78 (P) 77  Resp: (P) 12     Temp: (P) 98.3 F (36.8 C)     TempSrc: (P) Oral     SpO2: (P) 94%     Weight:      Height:        Intake/Output Summary (Last 24 hours) at 03/15/2020 0949 Last data filed at 03/14/2020 1300 Gross per 24 hour  Intake 240 ml  Output --  Net 240 ml   Filed Weights   03/11/20 0601 03/14/20 0604 03/15/20 0810  Weight: 96 kg 100 kg (P) 97.7 kg    Examination:  Awake Alert, No new F.N deficits, Normal affect [place].AT,PERRAL Supple Neck,No JVD, No cervical lymphadenopathy appriciated.  Symmetrical Chest wall movement, Good air movement bilaterally, CTAB RRR,No Gallops, Rubs or new Murmurs, No Parasternal Heave +ve B.Sounds, Abd Soft, No tenderness, No organomegaly appriciated, No rebound - guarding or rigidity. Bilat below-knee amputation stump with irregularity, no open wound.  Right index finger is gangrenous, swelling to the base of the index finger, sutures intact.   Data Reviewed: I have personally reviewed following labs and imaging studies  CBC: Recent Labs  Lab 03/09/20 2130 03/10/20 0313 03/11/20 0402 03/14/20 0812 03/15/20 0438  WBC 13.1*  --  10.2 18.9* 14.8*  NEUTROABS  --   --  6.9  --  10.0*  HGB 8.8* 9.9* 8.4* 7.1* 7.2*  HCT 26.9* 29.0* 26.5* 22.9* 23.6*  MCV 76.0*  --  77.7* 80.4 80.3  PLT 280  --  238 339 102   Basic Metabolic Panel: Recent Labs  Lab 03/09/20 2134 03/10/20 0313 03/11/20 0402 03/12/20 0410 03/13/20 0421 03/14/20 0812 03/15/20 0438  NA  --    < > 139 140 138 137 136  K  --    < > 4.1 4.1 5.1 4.5 4.2  CL  --   --  98 98 97* 97* 94*  CO2  --   --  24 23 21* 24 24  GLUCOSE  --   --  168* 139* 142* 133* 124*  BUN  --   --  45* 66* 73* 39* 55*  CREATININE  --   --  8.71* 11.44* 12.72* 8.18* 10.12*  CALCIUM  --   --  8.2* 8.1* 7.7* 8.2* 8.0*  MG 2.4  --  2.0  --   --  2.0 2.1   < > = values in this interval not displayed.    GFR: Estimated Creatinine Clearance: 9.4 mL/min (A) (by C-G formula based on SCr of 10.12 mg/dL (H)). Liver Function Tests: Recent Labs  Lab 03/09/20 2130 03/11/20 0402 03/12/20 0410 03/13/20 0421  AST 107* 59* 47* 35  ALT 169* 106* 76* 47*  ALKPHOS 93 67 62 56  BILITOT 1.0 1.0 0.7 0.6  PROT 8.1 8.1 7.8 7.8  ALBUMIN 3.3* 3.0* 2.9* 2.8*   No results for input(s): LIPASE, AMYLASE in the last 168 hours. Recent Labs  Lab 03/10/20 0359  AMMONIA 43*   Coagulation Profile: Recent Labs  Lab 03/09/20 2134  INR 1.2   Cardiac Enzymes: No results for input(s): CKTOTAL, CKMB, CKMBINDEX, TROPONINI in the last 168 hours. BNP (last 3 results) No results for input(s): PROBNP in the last 8760 hours. HbA1C: No results for input(s): HGBA1C in the last 72 hours. CBG: Recent Labs  Lab 03/14/20 0809 03/14/20 1150 03/14/20 1556 03/14/20 2137 03/15/20 0753  GLUCAP 126* 173* 134* 139* 116*   Lipid Profile: No results for input(s): CHOL, HDL, LDLCALC, TRIG, CHOLHDL, LDLDIRECT in the last 72 hours. Thyroid Function Tests: No results for input(s): TSH, T4TOTAL, FREET4, T3FREE, THYROIDAB in the last 72 hours. Anemia Panel: No results for input(s): VITAMINB12, FOLATE, FERRITIN, TIBC, IRON, RETICCTPCT in the last 72 hours. Sepsis Labs: Recent Labs  Lab 03/09/20 2228 03/10/20 0047 03/13/20 0421 03/14/20 0458 03/15/20 0438  PROCALCITON  --   --  >150.00 89.14 58.13  LATICACIDVEN 1.2 1.4  --   --   --     Recent Results (from the past 240 hour(s))  Blood Culture (routine x 2)     Status: None   Collection Time: 03/09/20  9:34 PM   Specimen: BLOOD RIGHT FOREARM  Result Value Ref Range Status   Specimen Description BLOOD RIGHT FOREARM  Final   Special Requests   Final    BOTTLES DRAWN AEROBIC AND ANAEROBIC Blood Culture results may not be optimal due to an inadequate volume of blood received in culture bottles  Culture   Final    NO GROWTH 5 DAYS Performed at [facility], [place] [address], [place], [place] [address]    Report Status 03/14/2020 FINAL  Final  Blood Culture (routine x 2)     Status: None   Collection Time: 03/09/20 11:28 PM   Specimen: BLOOD RIGHT HAND  Result Value Ref Range Status   Specimen Description BLOOD RIGHT HAND  Final   Special Requests   Final    BOTTLES DRAWN AEROBIC AND ANAEROBIC Blood Culture results may not be optimal due to an excessive volume of blood received in culture bottles   Culture   Final    NO GROWTH 5 DAYS Performed at [facility], [place] [address] [place], [place] [address]    Report Status 03/15/2020 FINAL  Final  SARS CORONAVIRUS 2 (TAT 6-24 HRS) Nasopharyngeal Nasopharyngeal Swab     Status: None   Collection Time: 03/10/20  2:53 AM   Specimen: Nasopharyngeal Swab  Result Value Ref Range Status   SARS Coronavirus 2 NEGATIVE NEGATIVE Final    Comment: (NOTE) SARS-CoV-2 target nucleic acids are NOT DETECTED. The SARS-CoV-2 RNA is generally detectable in upper and lower respiratory specimens during the acute phase of infection. Negative results do not preclude SARS-CoV-2 infection, do not rule out co-infections with other pathogens, and should not be used as the sole basis for treatment or other patient management decisions. Negative results must be combined with clinical observations, patient history, and epidemiological information. The expected result is Negative. Fact Sheet for Patients: SugarRoll.be Fact Sheet for Healthcare Providers: https://www.woods-mathews.com/ This test is not yet approved or cleared by the [place] FDA and  has been authorized for detection and/or diagnosis of SARS-CoV-2 by FDA under an Emergency Use Authorization (EUA). This EUA will remain  in effect (meaning this test can be used) for the duration of the COVID-19 declaration under Section 56 4(b)(1) of the Act, 21 U.S.C. section 360bbb-3(b)(1), unless the  authorization is terminated or revoked sooner. Performed at [facility], [place] [address] [place],  [address]   Aerobic/Anaerobic Culture (surgical/deep wound)     Status: None (Preliminary result)   Collection Time: 03/12/20  2:21 PM   Specimen: PATH Other; Tissue  Result Value Ref Range Status   Specimen Description FINGER RIGHT  Final   Special Requests RT FLEXOR SHEATH PT ON ANCEF  Final   Gram Stain   Final    NO WBC SEEN RARE GRAM NEGATIVE RODS Performed at [facility], [address] [place],  [address]    Culture   Final    FEW KLEBSIELLA OXYTOCA NO ANAEROBES ISOLATED; CULTURE IN PROGRESS FOR 5 DAYS    Report Status PENDING  Incomplete   Organism ID, Bacteria KLEBSIELLA OXYTOCA  Final      Susceptibility   Klebsiella oxytoca - MIC*    AMPICILLIN >=32 RESISTANT Resistant     CEFAZOLIN 16 SENSITIVE Sensitive     CEFEPIME <=0.12 SENSITIVE Sensitive     CEFTAZIDIME <=1 SENSITIVE Sensitive     CEFTRIAXONE <=0.25 SENSITIVE Sensitive     CIPROFLOXACIN <=0.25 SENSITIVE Sensitive     GENTAMICIN <=1 SENSITIVE Sensitive     IMIPENEM <=0.25 SENSITIVE Sensitive     TRIMETH/SULFA <=20 SENSITIVE Sensitive     AMPICILLIN/SULBACTAM 8 SENSITIVE Sensitive     PIP/TAZO <=4 SENSITIVE Sensitive     * FEW KLEBSIELLA OXYTOCA     Radiology Studies: No results found.  Scheduled Meds: . amLODipine  10 mg Oral Daily  . carvedilol  25 mg Oral BID WC  . Chlorhexidine Gluconate Cloth  6 each Topical Q0600  . darbepoetin (ARANESP) injection - DIALYSIS  100 mcg Intravenous Q Wed-HD  . gabapentin  100 mg Oral BID  . heparin      . heparin  4,600 Units Dialysis Once in dialysis  . heparin injection (subcutaneous)  5,000 Units Subcutaneous Q8H  . insulin aspart  0-5 Units Subcutaneous QHS  . insulin aspart  0-6 Units Subcutaneous TID WC  . morphine  30 mg Oral Q12H  . sevelamer carbonate  2,400 mg Oral TID WC   Continuous Infusions: . sodium chloride Stopped  (03/14/20 0019)     LOS: 5 days    Time spent: 30 minutes  Signature  Lala Lund M.D on 03/15/2020 at 9:49 AM   -  To page go to [email or web address]

## 2020-03-16 LAB — BASIC METABOLIC PANEL
Anion gap: 18 — ABNORMAL HIGH (ref 5–15)
BUN: 47 mg/dL — ABNORMAL HIGH (ref 8–23)
CO2: 23 mmol/L (ref 22–32)
Calcium: 8.3 mg/dL — ABNORMAL LOW (ref 8.9–10.3)
Chloride: 94 mmol/L — ABNORMAL LOW (ref 98–111)
Creatinine, Ser: 8.81 mg/dL — ABNORMAL HIGH (ref 0.61–1.24)
GFR calc Af Amer: 7 mL/min — ABNORMAL LOW (ref >60)
GFR calc non Af Amer: 6 mL/min — ABNORMAL LOW (ref >60)
Glucose, Bld: 115 mg/dL — ABNORMAL HIGH (ref 70–99)
Potassium: 4.2 mmol/L (ref 3.5–5.1)
Sodium: 135 mmol/L (ref 135–145)

## 2020-03-16 LAB — CBC WITH DIFFERENTIAL/PLATELET
Abs Immature Granulocytes: 0.11 10*3/uL — ABNORMAL HIGH (ref 0.00–0.07)
Basophils Absolute: 0.1 10*3/uL (ref 0.0–0.1)
Basophils Relative: 1 %
Eosinophils Absolute: 0.3 10*3/uL (ref 0.0–0.5)
Eosinophils Relative: 2 %
HCT: 24.5 % — ABNORMAL LOW (ref 39.0–52.0)
Hemoglobin: 7.4 g/dL — ABNORMAL LOW (ref 13.0–17.0)
Immature Granulocytes: 1 %
Lymphocytes Relative: 15 %
Lymphs Abs: 2.2 10*3/uL (ref 0.7–4.0)
MCH: 24.3 pg — ABNORMAL LOW (ref 26.0–34.0)
MCHC: 30.2 g/dL (ref 30.0–36.0)
MCV: 80.3 fL (ref 80.0–100.0)
Monocytes Absolute: 2.2 10*3/uL — ABNORMAL HIGH (ref 0.1–1.0)
Monocytes Relative: 15 %
Neutro Abs: 9.9 10*3/uL — ABNORMAL HIGH (ref 1.7–7.7)
Neutrophils Relative %: 66 %
Platelets: 369 10*3/uL (ref 150–400)
RBC: 3.05 MIL/uL — ABNORMAL LOW (ref 4.22–5.81)
RDW: 17.7 % — ABNORMAL HIGH (ref 11.5–15.5)
WBC: 14.7 10*3/uL — ABNORMAL HIGH (ref 4.0–10.5)
nRBC: 0 % (ref 0.0–0.2)

## 2020-03-16 LAB — PROCALCITONIN: Procalcitonin: 45.24 ng/mL

## 2020-03-16 LAB — DRUG SCREEN 10 W/CONF, SERUM
Amphetamines, IA: NEGATIVE ng/mL
Barbiturates, IA: NEGATIVE ug/mL
Benzodiazepines, IA: NEGATIVE ng/mL
Cocaine & Metabolite, IA: NEGATIVE ng/mL
Methadone, IA: NEGATIVE ng/mL
Opiates, IA: NEGATIVE ng/mL
Oxycodones, IA: NEGATIVE ng/mL
Phencyclidine, IA: NEGATIVE ng/mL
Propoxyphene, IA: NEGATIVE ng/mL
THC(Marijuana) Metabolite, IA: NEGATIVE ng/mL

## 2020-03-16 LAB — GLUCOSE, CAPILLARY
Glucose-Capillary: 170 mg/dL — ABNORMAL HIGH (ref 70–99)
Glucose-Capillary: 150 mg/dL — ABNORMAL HIGH (ref 70–99)

## 2020-03-16 LAB — OXYCODONES,MS,WB/SP RFX
Oxycocone: NEGATIVE ng/mL
Oxycodones Confirmation: NEGATIVE
Oxymorphone: NEGATIVE ng/mL

## 2020-03-16 LAB — C-REACTIVE PROTEIN: CRP: 17.1 mg/dL — ABNORMAL HIGH (ref <1.0)

## 2020-03-16 LAB — MAGNESIUM: Magnesium: 2.3 mg/dL (ref 1.7–2.4)

## 2020-03-16 LAB — BRAIN NATRIURETIC PEPTIDE: B Natriuretic Peptide: 158.2 pg/mL — ABNORMAL HIGH (ref 0.0–100.0)

## 2020-03-16 LAB — MRSA PCR SCREENING: MRSA by PCR: NEGATIVE

## 2020-03-16 MED ORDER — OXYCODONE-ACETAMINOPHEN 5-325 MG PO TABS: 1.0000 | ORAL_TABLET | Freq: Four times a day (QID) | ORAL | 0 refills | Status: DC | PRN

## 2020-03-16 MED ORDER — OXYCODONE-ACETAMINOPHEN 5-325 MG PO TABS: 1.0000 | ORAL_TABLET | Freq: Three times a day (TID) | ORAL | Status: DC | PRN

## 2020-03-16 MED ORDER — GABAPENTIN 300 MG PO CAPS: 300.0000 mg | ORAL_CAPSULE | Freq: Three times a day (TID) | ORAL | 0 refills | Status: DC

## 2020-03-16 MED ORDER — CEPHALEXIN 500 MG PO CAPS: 500.0000 mg | ORAL_CAPSULE | Freq: Two times a day (BID) | ORAL | 0 refills | Status: DC

## 2020-03-16 MED ORDER — NALOXONE HCL 0.4 MG/ML IJ SOLN
0.1000 mg | INTRAMUSCULAR | Status: DC | PRN
  Administered 2020-03-16: 0.1 mg via INTRAVENOUS

## 2020-03-16 MED ORDER — FERROUS SULFATE 325 (65 FE) MG PO TABS: 325.0000 mg | ORAL_TABLET | Freq: Two times a day (BID) | ORAL | 0 refills | Status: DC

## 2020-03-16 MED ORDER — LACTATED RINGERS IV SOLN: INTRAVENOUS | Status: AC

## 2020-03-16 MED ORDER — NALOXONE HCL 0.4 MG/ML IJ SOLN
INTRAMUSCULAR | Status: AC
  Filled 2020-03-16: qty 1

## 2020-03-16 NOTE — Discharge Instructions (Signed)
Follow with Primary MD [name], FNP in 7 days   Get CBC, CMP, 2 view Chest X ray -  checked next visit within 1 week by Primary MD   Activity: As tolerated with Full fall precautions use walker/cane & assistance as needed, keep right hand dry and clean at all times  Disposition Home    Diet: Heart Healthy  Low Carb  Special Instructions: If you have smoked or chewed Tobacco  in the last 2 yrs please stop smoking, stop any regular Alcohol  and or any Recreational drug use.  On your next visit with your primary care physician please Get Medicines reviewed and adjusted.  Please request your Prim.MD to go over all Hospital Tests and Procedure/Radiological results at the follow up, please get all Hospital records sent to your Prim MD by signing hospital release before you go home.  If you experience worsening of your admission symptoms, develop shortness of breath, life threatening emergency, suicidal or homicidal thoughts you must seek medical attention immediately by calling 911 or calling your MD immediately  if symptoms less severe.  You Must read complete instructions/literature along with all the possible adverse reactions/side effects for all the Medicines you take and that have been prescribed to you. Take any new Medicines after you have completely understood and accpet all the possible adverse reactions/side effects.

## 2020-03-16 NOTE — Progress Notes (Signed)
Occupational Therapy Evaluation Patient Details Name: [name] MRN: [id number] DOB: 11-Aug-1961 Today's Date: 03/16/2020    History of Present Illness 59 year old gentleman with history of ESRD on hemodialysis, MWF schedule, type 2 diabetes on insulin, GERD, hepatitis C, hypertension, severe peripheral vascular disease status post bilateral below-knee amputations, peripheral vascular disease and recent right index finger infection with interphalangeal amputation presented from home with lethargy and altered mental status. Found to have gangrenous right index finger now s/p amputation.    Clinical Impression   Pt's PLOF obtained from initial PT evaluation, as pt with confusion and lethargy this AM. Per RN and MD, pain medication can cause pt to be sleepy. Pt with varied responses (some tangential, sometimes no response at all) and difficulty following commands consistently. Pt easily distractible and perseverating on contacting wife. Attempted to redirect pt with pt performing bed mobility to sit EOB at Supervision level. Encouraged pt to attempt donning prosthetic LEs while sitting EOB, but unsuccessful. After about 5 min sitting EOB, pt became nausea and vomited terminating OT evaluation early. Unsure if pt's wife able to provide physical assist and unsure if pt's behaviors/cognition from medication. If pt to DC home today, recommend 24 hour supervision and physical assist. Recommend HHOT. If pt does not DC tomorrow, plan to re-assess functional abilities and update recommendations as needed.     Follow Up Recommendations  Home health OT;Supervision/Assistance - 24 hour    Equipment Recommendations  None recommended by OT    Recommendations for Other Services       Precautions / Restrictions Precautions Precautions: Fall;Other (comment) Precaution Comments: bilat BKA Restrictions Weight Bearing Restrictions: No      Mobility Bed Mobility Overal bed mobility: Needs  Assistance Bed Mobility: Supine to Sit     Supine to sit: Supervision Sit to supine: Min assist   General bed mobility comments: Supervision with use of bed rails and HOB elevated   Transfers Overall transfer level: Needs assistance Equipment used: Rolling walker (2 wheeled);Right platform walker Transfers: Sit to/from Stand Sit to Stand: Min assist;From elevated surface         General transfer comment: unable to assess as pt became nauseous and vomited     Balance Overall balance assessment: Needs assistance Sitting-balance support: Feet unsupported;Bilateral upper extremity supported Sitting balance-Leahy Scale: Fair     Standing balance support: Bilateral upper extremity supported;During functional activity Standing balance-Leahy Scale: Fair                             ADL either performed or assessed with clinical judgement   ADL Overall ADL's : Needs assistance/impaired Eating/Feeding: Independent;Sitting   Grooming: Set up;Supervision/safety;Sitting   Upper Body Bathing: Minimal assistance;Sitting   Lower Body Bathing: Minimal assistance;Sitting/lateral leans   Upper Body Dressing : Minimal assistance;Sitting   Lower Body Dressing: Moderate assistance;Sitting/lateral leans     Toilet Transfer Details (indicate cue type and reason): unable to assess  Toileting- Clothing Manipulation and Hygiene: Sitting/lateral lean;Maximal assistance         General ADL Comments: Unable to fully assess ADLs as pt difficulty following commands, became nausea and vomited during session      Vision         Perception     Praxis      Pertinent Vitals/Pain Pain Assessment: Faces Faces Pain Scale: Hurts little more Pain Location: R hand Pain Descriptors / Indicators: Grimacing;Guarding Pain Intervention(s): Premedicated before session  Hand Dominance Right   Extremity/Trunk Assessment Upper Extremity Assessment Upper Extremity Assessment: RUE  deficits/detail RUE Deficits / Details: S/p 2nd finger amputation. Able to wiggle 1, 3-5 fingers although stiff. Reports decreased senation. RUE Sensation: decreased light touch RUE Coordination: decreased fine motor   Lower Extremity Assessment Lower Extremity Assessment: Defer to PT evaluation       Communication Communication Communication: No difficulties   Cognition Arousal/Alertness: Lethargic;Suspect due to medications Behavior During Therapy: Flat affect Overall Cognitive Status: Impaired/Different from baseline Area of Impairment: Attention;Following commands;Safety/judgement;Awareness;Problem solving                       Following Commands: Follows one step commands inconsistently Safety/Judgement: Decreased awareness of safety;Decreased awareness of deficits   Problem Solving: Difficulty sequencing;Requires verbal cues;Requires tactile cues General Comments: Pt with flat affect, inconsistent responses to questions/directions. pt perseverating on calling wife, easily distracted    General Comments  Pt with varied responses (some non-sensical, sometimes no response at all). Per RN, pain meds make pt lethargic. Pt with difficulty following commands, ended up becoming nauseous and vomiting during OT session prior to further evaluation of daily tasks    Exercises     Shoulder Instructions      Home Living Family/patient expects to be discharged to:: Private residence Living Arrangements: Spouse/significant other Available Help at Discharge: Family Type of Home: House Home Access: Level entry     Home Layout: One level               Home Equipment: Environmental consultant - 2 wheels;Bedside commode;Cane - quad;Crutches;Wheelchair - manual;Tub bench          Prior Functioning/Environment Level of Independence: Independent        Comments: Furniture walks with household ambulation; reports increased fatigue after dialysis days.        OT Problem List:  Decreased strength;Decreased activity tolerance;Impaired balance (sitting and/or standing);Decreased cognition;Decreased coordination;Decreased safety awareness;Decreased knowledge of use of DME or AE;Pain;Impaired UE functional use      OT Treatment/Interventions: Self-care/ADL training;Therapeutic exercise;Energy conservation;DME and/or AE instruction;Therapeutic activities;Patient/family education    OT Goals(Current goals can be found in the care plan section) Acute Rehab OT Goals Patient Stated Goal: be able to go home OT Goal Formulation: With patient Time For Goal Achievement: 03/30/20 Potential to Achieve Goals: Good ADL Goals Pt Will Perform Grooming: with set-up;standing Pt Will Perform Upper Body Bathing: with set-up;sitting;standing Pt Will Perform Lower Body Bathing: with min guard assist;sit to/from stand Pt Will Transfer to Toilet: with min assist;stand pivot transfer;bedside commode Pt Will Perform Toileting - Clothing Manipulation and hygiene: with min guard assist;sit to/from stand;sitting/lateral leans Additional ADL Goal #1: Pt will demonstrate ability to don bilateral prosthetic LEs Independently in order to maximize independence with ADLs.  OT Frequency: Min 3X/week   Barriers to D/C:            Co-evaluation              AM-PAC OT "6 Clicks" Daily Activity     Outcome Measure Help from another person eating meals?: None Help from another person taking care of personal grooming?: A Little Help from another person toileting, which includes using toliet, bedpan, or urinal?: A Lot Help from another person bathing (including washing, rinsing, drying)?: A Little Help from another person to put on and taking off regular upper body clothing?: A Little Help from another person to put on and taking off regular lower body clothing?: A Little  6 Click Score: 18   End of Session Nurse Communication: Mobility status;Other (comment)(lethargy, vomiting )  Activity  Tolerance: Patient limited by lethargy;Other (comment)(nausea) Patient left: in bed;with call bell/phone within reach  OT Visit Diagnosis: Other abnormalities of gait and mobility (R26.89);Muscle weakness (generalized) (M62.81)                Time: 4949-4473 OT Time Calculation (min): 23 min Charges:  OT General Charges $OT Visit: 1 Visit OT Evaluation $OT Eval Moderate Complexity: 1 Mod OT Treatments $Therapeutic Activity: 8-22 mins  Layla Maw, OTR/L  Layla Maw 03/16/2020, 1:52 PM

## 2020-03-16 NOTE — Progress Notes (Addendum)
[facility] Progress Note   Subjective: Being [place] home today. No C/Os.  Objective Vitals:   03/16/20 0415 03/16/20 0820 03/16/20 0824 03/16/20 0825  BP: (!) 154/69 (!) 155/65 137/82   Pulse: 71 68  83  Resp: 12 14    Temp: 98.2 F (36.8 C) 98.3 F (36.8 C)    TempSrc: Oral Oral    SpO2: 95% (!) 89%    Weight:      Height:       Physical Exam General: Pleasant older male in NAD Heart: S1,S2 RRR Lungs: CTAB Abdomen: Active BS Extremities:Bilateral BKA foam drsg on stump of L BKA Dialysis Access: L AVF + bruit   Additional Objective Labs: Basic Metabolic Panel: Recent Labs  Lab 03/14/20 0812 03/15/20 0438 03/16/20 0436  NA 137 136 135  K 4.5 4.2 4.2  CL 97* 94* 94*  CO2 24 24 23   GLUCOSE 133* 124* 115*  BUN 39* 55* 47*  CREATININE 8.18* 10.12* 8.81*  CALCIUM 8.2* 8.0* 8.3*   Liver Function Tests: Recent Labs  Lab 03/11/20 0402 03/12/20 0410 03/13/20 0421  AST 59* 47* 35  ALT 106* 76* 47*  ALKPHOS 67 62 56  BILITOT 1.0 0.7 0.6  PROT 8.1 7.8 7.8  ALBUMIN 3.0* 2.9* 2.8*   No results for input(s): LIPASE, AMYLASE in the last 168 hours. CBC: Recent Labs  Lab 03/09/20 2130 03/10/20 0313 03/11/20 0402 03/11/20 0402 03/14/20 0812 03/15/20 0438 03/16/20 0436  WBC 13.1*  --  10.2   < > 18.9* 14.8* 14.7*  NEUTROABS  --   --  6.9  --   --  10.0* 9.9*  HGB 8.8*   < > 8.4*   < > 7.1* 7.2* 7.4*  HCT 26.9*   < > 26.5*   < > 22.9* 23.6* 24.5*  MCV 76.0*  --  77.7*  --  80.4 80.3 80.3  PLT 280  --  238   < > 339 329 369   < > = values in this interval not displayed.   Blood Culture    Component Value Date/Time   SDES FINGER RIGHT 03/12/2020 1421   SPECREQUEST RT FLEXOR SHEATH PT ON ANCEF 03/12/2020 1421   CULT  03/12/2020 1421    FEW KLEBSIELLA OXYTOCA NO ANAEROBES ISOLATED; CULTURE IN PROGRESS FOR 5 DAYS    REPTSTATUS PENDING 03/12/2020 1421    Cardiac Enzymes: No results for input(s): CKTOTAL, CKMB, CKMBINDEX, TROPONINI in the  last 168 hours. CBG: Recent Labs  Lab 03/15/20 1222 03/15/20 1652 03/15/20 2119 03/16/20 0733 03/16/20 1141  GLUCAP 160* 141* 125* 170* 150*   Iron Studies: No results for input(s): IRON, TIBC, TRANSFERRIN, FERRITIN in the last 72 hours. @lablastinr3 @ Studies/Results: No results found. Medications: . sodium chloride Stopped (03/14/20 0019)   . amLODipine  10 mg Oral Daily  . carvedilol  25 mg Oral BID WC  . cephALEXin  500 mg Oral Q12H  . Chlorhexidine Gluconate Cloth  6 each Topical Q0600  . darbepoetin (ARANESP) injection - DIALYSIS  100 mcg Intravenous Q Wed-HD  . gabapentin  100 mg Oral BID  . heparin  4,600 Units Dialysis Once in dialysis  . heparin injection (subcutaneous)  5,000 Units Subcutaneous Q8H  . insulin aspart  0-5 Units Subcutaneous QHS  . insulin aspart  0-6 Units Subcutaneous TID WC  . sevelamer carbonate  2,400 mg Oral TID WC     Dialysis Orders:[facility] MWF  4h 74min 425/800 98kg 2K/2.5Ca bath AVF Hep 4600  Mircera 100 (due 4/7) Venofer 50 Calcitriol 1.75  Assessment/Plan: 1. Fever/leukocytosis on admission. COVID negative. Wound culture +Klebsiella.  Blood culturesneg to date. Antibiotics per primary 2. Gangrene R ring finger -sp R 4th digit amputation 4/9 3. ESRD -HD MWF. HDtomorrow. 4. Hypertension/volume - BP evated. Continue home meds. No volume excess on exam.  5. Anemia - Hgb 7.4 Aranesp 100 with HD 4/8. 6. Metabolic bone disease -Continue binders/VDRA. 7. Nutrition -Renal diet/vitamins  8. DM - per primary  Rita H. Brown NP-C 03/16/2020, 12:27 PM  [facility] [phone]  I have seen and examined this patient and agree with plan and assessment in the above note with renal recommendations/intervention highlighted.  Pt to be discharged today and f/u at outpatient HD tomorrow.  Governor Rooks Yared Barefoot,MD 03/16/2020 3:56 PM

## 2020-03-16 NOTE — Discharge Summary (Addendum)
[name] WGY:[id number] DOB: Jan 16, 1961 DOA: 03/09/2020  PCP: [name], FNP  Admit date: 03/09/2020  Discharge date: 03/16/2020  Admitted From: Home   Disposition:  Home   Recommendations for Outpatient Follow-up:   Follow up with PCP in 1-2 weeks  PCP Please obtain BMP/CBC, 2 view CXR in 1week,  (see Discharge instructions)   PCP Please follow up on the following pending results:    Home Health: RN, PT, OT if he agrees   Equipment/Devices: Electric wheelchair due to bilateral BKA and now right index finger being amputated Consultations: None  Discharge Condition: Stable    CODE STATUS: Full    Diet Recommendation: Heart Healthy Low Carb  Diet Order            Diet regular Room service appropriate? Yes; Fluid consistency: Thin  Diet effective now               Chief Complaint  Patient presents with  . Altered Mental Status     Brief history of present illness from the day of admission and additional interim summary    59 year old gentleman with history of ESRD on hemodialysis, MWF schedule, type 2 diabetes on insulin, GERD, hepatitis C, hypertension, severe peripheral vascular disease status post bilateral below-knee amputations, peripheral vascular disease and recent right index finger infection with interphalangeal amputation presented from home with lethargy and altered mental status.  Wife reported that he was incoherent and slurring his speech.  He was very somnolent.  Patient also taking oxycodone every 6 hours for pain.  Patient also has history of ischemia of the hands.  In the emergency room, febrile with temperature 100.7.  Blood pressure stable.  Matter-of-fact elevated.  WBC count 13.1.  Lactate normal.  COVID-19 negative.  Chest x-ray no acute findings.  Gangrenous right index  finger with x-ray evidence of destruction of bone.  Started on broad-spectrum antibiotics and admitted.                                                                 Hospital Course   Sepsis present on admission, resolving  - Probable primary source from right finger with extended infection.  Blood cultures so far negative, continue empiric antibiotics, underwent right hand index finger amputation by orthopedics on 03/12/2020.  Continue close monitoring and follow cultures.  Sepsis pathophysiology has resolved.  Nasal MRSA screen negative, blood cultures negative, wound culture growing Klebsiella which appears to be pansensitive, right index finger has been removed and case discussed with Dr. Karie Soda surgeon who states that there might be some mild soft tissue infection, that patient might benefit from a week of oral Keflex, discussed with ID physician Dr. Alton Revere on 03/15/2020.  Per hand surgeon dressing changes will be done by him in the office.  Patient  will be discharged today on 10 days of oral Keflex, he was initially refusing home health PT OT but today he says he might try it, will order if he accepts.  Home dose narcotic 10 more pills added.  He has been strictly advised not to overuse.   Acute metabolic infective encephalopathy: Due to sepsis now resolved.  Hypertension: Pressure running high on Norvasc and beta-blocker combination dose adjusted for better control will continue to monitor.  ESRD on hemodialysis: Nephrology following on MWF schedule.  Ongoing postop hand pain.  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    Type 2 diabetes on insulin, uncontrolled with hypoglycemia: Continue home regimen.  Lab Results  Component Value Date   HGBA1C 7.5 (H) 03/10/2020   CBG (last 3)  Recent Labs    03/15/20 1652 03/15/20 2119 03/16/20 0733  GLUCAP 141*  125* 170*    Discharge diagnosis     Principal Problem:   Acute metabolic encephalopathy Active Problems:   HTN (hypertension)   Type 2 diabetes mellitus (HCC)   ESRD (end stage renal disease) ([place])   Fever   Sepsis ([place])    Discharge instructions    Discharge Instructions    Discharge instructions   Complete by: As directed    Follow with Primary MD [name], FNP in 7 days   Get CBC, CMP, 2 view Chest X ray -  checked next visit within 1 week by Primary MD   Activity: As tolerated with Full fall precautions use walker/cane & assistance as needed, keep right hand dry and clean at all times  Disposition Home    Diet: Heart Healthy  Low Carb  Special Instructions: If you have smoked or chewed Tobacco  in the last 2 yrs please stop smoking, stop any regular Alcohol  and or any Recreational drug use.  On your next visit with your primary care physician please Get Medicines reviewed and adjusted.  Please request your Prim.MD to go over all Hospital Tests and Procedure/Radiological results at the follow up, please get all Hospital records sent to your Prim MD by signing hospital release before you go home.  If you experience worsening of your admission symptoms, develop shortness of breath, life threatening emergency, suicidal or homicidal thoughts you must seek medical attention immediately by calling 911 or calling your MD immediately  if symptoms less severe.  You Must read complete instructions/literature along with all the possible adverse reactions/side effects for all the Medicines you take and that have been prescribed to you. Take any new Medicines after you have completely understood and accpet all the possible adverse reactions/side effects.   Increase activity slowly   Complete by: As directed       Discharge Medications   Allergies as of 03/16/2020   No Known Allergies     Medication List    TAKE these medications   carvedilol 12.5 MG  tablet Commonly known as: COREG Take 12.5 mg by mouth 2 (two) times daily with a meal.   cephALEXin 500 MG capsule Commonly known as: KEFLEX Take 1 capsule (500 mg total) by mouth every 12 (twelve) hours.   ferrous sulfate 325 (65 FE) MG tablet Take 1 tablet (325 mg total) by mouth 2 (two) times daily with a meal.   gabapentin 300 MG capsule Commonly known as: NEURONTIN Take 1 capsule (300 mg total) by mouth 3 (three) times daily.  insulin glargine 100 UNIT/ML injection Commonly known as: LANTUS Inject 0.18 mLs (18 Units total) into the skin 2 (two) times daily. What changed: how much to take   oxyCODONE-acetaminophen 5-325 MG tablet Commonly known as: PERCOCET/ROXICET Take 1 tablet by mouth 4 (four) times daily as needed. What changed: reasons to take this   sevelamer carbonate 800 MG tablet Commonly known as: RENVELA Take 3 tablets (2,400 mg total) by mouth 3 (three) times daily with meals. What changed: how much to take            Durable Medical Equipment  (From admission, onward)         Start     Ordered   03/16/20 0959  For home use only DME Walker platform  Once    Question Answer Comment  Patient needs a walker to treat with the following condition Mobility impaired   Patient needs a walker to treat with the following condition Finger amputee      03/16/20 1000   Unscheduled  For home use only DME Wheelchair electric  Once    Comments: Bilateral BKA, now right hand index finger amputated due to infection.   03/16/20 1032          Follow-up Information    Charlotte Crumb, MD On 03/16/2020.   Specialty: Orthopedic Surgery Why: For wound re-check Contact information: [place] [address] [phone]        Assessment for Electric Wheelchair Follow up.   Contact information: Please call  [facility] [phone] to set up the home assessment for one       [name], FNP. Schedule an appointment as soon as possible for  a visit in 1 week(s).   Specialty: Family Medicine Contact information: [address]  [address] [phone]           Major procedures and Radiology Reports - PLEASE review detailed and final reports thoroughly  -      CT Head Wo Contrast  Result Date: 03/09/2020 CLINICAL DATA:  Altered mental status. EXAM: CT HEAD WITHOUT CONTRAST TECHNIQUE: Contiguous axial images were obtained from the base of the skull through the vertex without intravenous contrast. COMPARISON:  None. FINDINGS: Brain: There is mild cerebral atrophy with widening of the extra-axial spaces and ventricular dilatation. There are areas of decreased attenuation within the white matter tracts of the supratentorial brain, consistent with microvascular disease changes. A small area of chronic white matter low attenuation is seen within the posterior aspect of the cerebellum on the left. Vascular: No hyperdense vessel or unexpected calcification. Skull: Normal. Negative for fracture or focal lesion. Sinuses/Orbits: No acute finding. Other: None. IMPRESSION: 1. Generalized cerebral atrophy. 2. No acute intracranial abnormality. Electronically Signed   By: Virgina Norfolk M.D.   On: 03/09/2020 23:15   DG Chest Port 1 View  Result Date: 03/09/2020 CLINICAL DATA:  Altered mental status EXAM: PORTABLE CHEST 1 VIEW COMPARISON:  03/03/2020 FINDINGS: Low lung volumes, bibasilar atelectasis. Heart is normal size. No effusions or acute bony abnormality. IMPRESSION: Low lung volumes, bibasilar atelectasis. Electronically Signed   By: Rolm Baptise M.D.   On: 03/09/2020 22:50   DG Chest Port 1 View  Result Date: 03/03/2020 CLINICAL DATA:  End-stage renal disease. Missed dialysis EXAM: PORTABLE CHEST 1 VIEW COMPARISON:  10/25/2018 FINDINGS: The heart size and mediastinal contours are stable. Mild pulmonary vascular congestion. No focal airspace consolidation, pleural effusion, or pneumothorax. The visualized skeletal structures  are unremarkable. IMPRESSION: Mild  pulmonary vascular congestion. Electronically Signed   By: Davina Poke D.O.   On: 03/03/2020 12:16   DG Hand Complete Right  Result Date: 03/10/2020 CLINICAL DATA:  Right hand pain and fever. EXAM: RIGHT HAND - COMPLETE 3+ VIEW COMPARISON:  December 30, 2019 FINDINGS: The patient is status post amputation of the distal phalanx of the second digit. There is irregularity of the soft tissue surrounding the head of the middle phalanx of the second digit. There is questionable increasing lucency involving the head of the middle phalanx of the second digit. Evaluation was limited by suboptimal lateral view. Advanced vascular calcifications are noted. There are degenerative changes of the interphalangeal joints. IMPRESSION: 1. Interval amputation of the distal phalanx of the second digit. 2. There is an irregular appearance of the soft tissues overlying the head of the middle phalanx of the second digit. Correlation with physical exam is recommended. 3. Subtle lucency involving the head of the middle phalanx of the second digit could represent developing osteomyelitis in the appropriate clinical setting. A follow-up nonemergent MRI would be useful for further characterization. Electronically Signed   By: Constance Holster M.D.   On: 03/10/2020 01:19   US Abdomen Limited RUQ  Result Date: 03/10/2020 CLINICAL DATA:  Elevated liver function tests. EXAM: ULTRASOUND ABDOMEN LIMITED RIGHT UPPER QUADRANT COMPARISON:  None. FINDINGS: Gallbladder: Gallbladder is mildly distended. No gallstones or wall thickening visualized (3.6 mm). No sonographic Murphy sign noted by sonographer. Common bile duct: Diameter: 4.6 mm Liver: No focal lesion identified. Diffusely increased echogenicity of the liver parenchyma is seen. Portal vein is patent on color Doppler imaging with normal direction of blood flow towards the liver. Other: None. IMPRESSION: 1. Fatty liver. 2. Mildly distended gallbladder  without evidence of cholelithiasis or acute cholecystitis. Electronically Signed   By: Virgina Norfolk M.D.   On: 03/10/2020 02:50    Micro Results     Recent Results (from the past 240 hour(s))  Blood Culture (routine x 2)     Status: None   Collection Time: 03/09/20  9:34 PM   Specimen: BLOOD RIGHT FOREARM  Result Value Ref Range Status   Specimen Description BLOOD RIGHT FOREARM  Final   Special Requests   Final    BOTTLES DRAWN AEROBIC AND ANAEROBIC Blood Culture results may not be optimal due to an inadequate volume of blood received in culture bottles   Culture   Final    NO GROWTH 5 DAYS Performed at [facility], [place] [address], [place], [place] [address]    Report Status 03/14/2020 FINAL  Final  Blood Culture (routine x 2)     Status: None   Collection Time: 03/09/20 11:28 PM   Specimen: BLOOD RIGHT HAND  Result Value Ref Range Status   Specimen Description BLOOD RIGHT HAND  Final   Special Requests   Final    BOTTLES DRAWN AEROBIC AND ANAEROBIC Blood Culture results may not be optimal due to an excessive volume of blood received in culture bottles   Culture   Final    NO GROWTH 5 DAYS Performed at [facility], [place] [address], [place],  [address]    Report Status 03/15/2020 FINAL  Final  SARS CORONAVIRUS 2 (TAT 6-24 HRS) Nasopharyngeal Nasopharyngeal Swab     Status: None   Collection Time: 03/10/20  2:53 AM   Specimen: Nasopharyngeal Swab  Result Value Ref Range Status   SARS Coronavirus 2 NEGATIVE NEGATIVE Final    Comment: (NOTE)  SARS-CoV-2 target nucleic acids are NOT DETECTED. The SARS-CoV-2 RNA is generally detectable in upper and lower respiratory specimens during the acute phase of infection. Negative results do not preclude SARS-CoV-2 infection, do not rule out co-infections with other pathogens, and should not be used as the sole basis for treatment or other patient management decisions. Negative results must be combined with clinical  observations, patient history, and epidemiological information. The expected result is Negative. Fact Sheet for Patients: SugarRoll.be Fact Sheet for Healthcare Providers: https://www.woods-mathews.com/ This test is not yet approved or cleared by the [place] FDA and  has been authorized for detection and/or diagnosis of SARS-CoV-2 by FDA under an Emergency Use Authorization (EUA). This EUA will remain  in effect (meaning this test can be used) for the duration of the COVID-19 declaration under Section 56 4(b)(1) of the Act, 21 U.S.C. section 360bbb-3(b)(1), unless the authorization is terminated or revoked sooner. Performed at [facility], [employer] [address], [place], [place] [address]   Aerobic/Anaerobic Culture (surgical/deep wound)     Status: None (Preliminary result)   Collection Time: 03/12/20  2:21 PM   Specimen: PATH Other; Tissue  Result Value Ref Range Status   Specimen Description FINGER RIGHT  Final   Special Requests RT FLEXOR SHEATH PT ON ANCEF  Final   Gram Stain   Final    NO WBC SEEN RARE GRAM NEGATIVE RODS Performed at [facility], [address] [place], [place] [address]    Culture   Final    FEW KLEBSIELLA OXYTOCA NO ANAEROBES ISOLATED; CULTURE IN PROGRESS FOR 5 DAYS    Report Status PENDING  Incomplete   Organism ID, Bacteria KLEBSIELLA OXYTOCA  Final      Susceptibility   Klebsiella oxytoca - MIC*    AMPICILLIN >=32 RESISTANT Resistant     CEFAZOLIN 16 SENSITIVE Sensitive     CEFEPIME <=0.12 SENSITIVE Sensitive     CEFTAZIDIME <=1 SENSITIVE Sensitive     CEFTRIAXONE <=0.25 SENSITIVE Sensitive     CIPROFLOXACIN <=0.25 SENSITIVE Sensitive     GENTAMICIN <=1 SENSITIVE Sensitive     IMIPENEM <=0.25 SENSITIVE Sensitive     TRIMETH/SULFA <=20 SENSITIVE Sensitive     AMPICILLIN/SULBACTAM 8 SENSITIVE Sensitive     PIP/TAZO <=4 SENSITIVE Sensitive     * FEW KLEBSIELLA OXYTOCA  MRSA PCR Screening      Status: None   Collection Time: 03/16/20 12:27 AM   Specimen: Nasal Mucosa; Nasopharyngeal  Result Value Ref Range Status   MRSA by PCR NEGATIVE NEGATIVE Final    Comment:        The GeneXpert MRSA Assay (FDA approved for NASAL specimens only), is one component of a comprehensive MRSA colonization surveillance program. It is not intended to diagnose MRSA infection nor to guide or monitor treatment for MRSA infections. Performed at [facility], [place] [address] [place], [place] [address]     Today   Subjective    [name] today has no headache,no chest abdominal pain,no new weakness tingling or numbness, feels much better wants to go home today.     Objective   Blood pressure 137/82, pulse 83, temperature 98.3 F (36.8 C), temperature source Oral, resp. rate 14, height 6\' 4"  (1.93 m), weight 93.8 kg, SpO2 90%   Intake/Output Summary (Last 24 hours) at 03/16/2020 1032 Last data filed at 03/16/2020 0313 Gross per 24 hour  Intake 383.74 ml  Output 2000 ml  Net -1616.26 ml  Exam  Sleepy but easily arousable and answers all questions, no new F.N deficits, Normal affect [place].AT,PERRAL Supple Neck,No JVD, No cervical lymphadenopathy appriciated.  Symmetrical Chest wall movement, Good air movement bilaterally, CTAB RRR,No Gallops,Rubs or new Murmurs, No Parasternal Heave +ve B.Sounds, Abd Soft, Non tender, No organomegaly appriciated, No rebound -guarding or rigidity. No Cyanosis, R Hand under splint and bandage, Bilat BKA   Data Review   CBC w Diff:  Lab Results  Component Value Date   WBC 14.7 (H) 03/16/2020   HGB 7.4 (L) 03/16/2020   HCT 24.5 (L) 03/16/2020   PLT 369 03/16/2020   LYMPHOPCT 15 03/16/2020   MONOPCT 15 03/16/2020   EOSPCT 2 03/16/2020   BASOPCT 1 03/16/2020    CMP:  Lab Results  Component Value Date   NA 135 03/16/2020   K 4.2 03/16/2020   CL 94 (L) 03/16/2020   CO2 23 03/16/2020   BUN 47 (H) 03/16/2020   CREATININE 8.81 (H)  03/16/2020   CREATININE 7.79 (H) 01/20/2020   PROT 7.8 03/13/2020   ALBUMIN 2.8 (L) 03/13/2020   BILITOT 0.6 03/13/2020   ALKPHOS 56 03/13/2020   AST 35 03/13/2020   ALT 47 (H) 03/13/2020  .   Total Time in preparing paper work, data evaluation and todays exam - 46 minutes  Lala Lund M.D on 03/16/2020 at 10:32 AM  [facility]   Office  [phone]

## 2020-03-16 NOTE — Progress Notes (Signed)
Physical Therapy Treatment Patient Details Name: [name] MRN: [id number] DOB: 04-25-1961 Today's Date: 03/16/2020    History of Present Illness 59 year old gentleman with history of ESRD on hemodialysis, MWF schedule, type 2 diabetes on insulin, GERD, hepatitis C, hypertension, severe peripheral vascular disease status post bilateral below-knee amputations, peripheral vascular disease and recent right index finger infection with interphalangeal amputation presented from home with lethargy and altered mental status. Found to have gangrenous right index finger now s/p amputation.     PT Comments    Pt's treatment was complicated by his confusion and lethargy.  Pt providing unclear and contradictory statements regarding how he feels and his home situation.  Pt was lethargic, confused, and unsteady with transfers and gait.  He required assist to don prosthesis due to R hand injury.  He did ambulate 38' with platform walker but unsteady.  Per pt and per MD note, possibly trying to get power w/c.  Pt reports may have manual w/c at home.  At this point, pt required assist for transfers, gait, and would require assist to scoot to w/c.  He reports wife unable to assist.  From PT standpoint, pt would need 24 hour assist/support base on current mobility and confusion if going home.  Due to unsteadiness would recommend use of w/c (would likely have to be power b/c unable to use R UE and has bil BKA), but if confusion/lethargy improves he may be able to use a platform walker. Spoke with Education officer, museum who has tried to contact wife and been unable.  Notified MD of change and from PT standpoint unsafe to return home in current status.      Follow Up Recommendations  Supervision/Assistance - 24 hour;Home health PT     Equipment Recommendations  (R UE platform vs w/c (unsure as pt confused and not able to provide accurate PLOF/home situation))    Recommendations for Other Services       Precautions /  Restrictions Precautions Precautions: Fall;Other (comment) Precaution Comments: bilat BKA    Mobility  Bed Mobility Overal bed mobility: Needs Assistance Bed Mobility: Supine to Sit;Sit to Supine     Supine to sit: Min assist Sit to supine: Min assist   General bed mobility comments: increased time anc cues  Transfers Overall transfer level: Needs assistance Equipment used: Rolling walker (2 wheeled);Right platform walker Transfers: Sit to/from Stand Sit to Stand: Min assist;From elevated surface         General transfer comment: increased time and bed signficantly elevated; 2 attempts required  Ambulation/Gait Ambulation/Gait assistance: Min assist Gait Distance (Feet): 20 Feet Assistive device: Right platform walker;Rolling walker (2 wheeled) Gait Pattern/deviations: Step-through pattern;Decreased stride length Gait velocity: decreased   General Gait Details: Pt initially reporting shaky and that felt like he would fall if he walked.  Discussed just standing and taking steps at EOB.  Once standing pt started walking.  PT attempted to have pt stay at bed since he said he felt dizzy/shaky and didn't want to walk but he stated "no I want to walk, you all keep telling me I can't."  PT eventually convinced pt to turn around and walk back to bed.  Required assist for walker with turns and min A to steady.   Stairs             Wheelchair Mobility    Modified Rankin (Stroke Patients Only)       Balance Overall balance assessment: Needs assistance Sitting-balance support: Feet unsupported;Bilateral upper extremity supported  Sitting balance-Leahy Scale: Fair     Standing balance support: Bilateral upper extremity supported;During functional activity Standing balance-Leahy Scale: Fair                              Cognition Arousal/Alertness: Lethargic Behavior During Therapy: WFL for tasks assessed/performed Overall Cognitive Status:  Impaired/Different from baseline Area of Impairment: Attention;Following commands;Safety/judgement;Awareness;Problem solving                       Following Commands: Follows one step commands inconsistently Safety/Judgement: Decreased awareness of safety;Decreased awareness of deficits   Problem Solving: Difficulty sequencing;Requires verbal cues;Requires tactile cues General Comments: Pt with increased lethargy and confusion today.  His speech was tangential.  He would frequently say something and then something else that completely contradicted what he just said.  Nursing aware and reports MD has stopped some medications.      Exercises      General Comments General comments (skin integrity, edema, etc.):  Pt had c/o shakiness and "equilibrium off".  All VSS.    Pt somewhat confused, tangential, and unable to provide detailed or accurate representation of his mobility needs at home (contradicts self).  He states his wife would be unable to physically assist.  He has injured R hand and bil BKA.  Pt required assist to don prosthesis.  He was able to scoot at EOB but did require pushing through his legs on the floor and assist.  Pt reports has to go to dialysis and make it to the end of his driveway for transportation to pick him up.  He may have a manual w/c? This was unclear.        Pertinent Vitals/Pain Pain Assessment: No/denies pain    Home Living                      Prior Function            PT Goals (current goals can now be found in the care plan section) Progress towards PT goals: Progressing toward goals    Frequency    Min 3X/week      PT Plan Discharge plan needs to be updated    Co-evaluation              AM-PAC PT "6 Clicks" Mobility   Outcome Measure  Help needed turning from your back to your side while in a flat bed without using bedrails?: A Little Help needed moving from lying on your back to sitting on the side of a flat  bed without using bedrails?: A Little Help needed moving to and from a bed to a chair (including a wheelchair)?: A Lot Help needed standing up from a chair using your arms (e.g., wheelchair or bedside chair)?: A Little Help needed to walk in hospital room?: A Little Help needed climbing 3-5 steps with a railing? : A Lot 6 Click Score: 16    End of Session Equipment Utilized During Treatment: Gait belt;Other (comment) Activity Tolerance: Patient limited by lethargy Patient left: in bed;with call bell/phone within reach;with bed alarm set Nurse Communication: Mobility status(pt's confusion) PT Visit Diagnosis: Unsteadiness on feet (R26.81);Other abnormalities of gait and mobility (R26.89);Difficulty in walking, not elsewhere classified (R26.2);Pain Pain - Right/Left: Left Pain - part of body: Hand     Time: 1610-9604 PT Time Calculation (min) (ACUTE ONLY): 42 min  Charges:  $Gait Training: 8-22 mins $Therapeutic  Activity: 23-37 mins                     Maggie Font, PT Acute Rehab Services Pager [phone] [facility] [phone] [facility] [phone]    [name] 03/16/2020, 11:46 AM

## 2020-03-16 NOTE — Care Management Important Message (Signed)
Important Message  Patient Details  Name: [name] MRN: [id number] Date of Birth: [birth year]   Medicare Important Message Given:  Yes - Important Message mailed due to current National Emergency  Verbal consent obtained due to current National Emergency  Relationship to patient: Self Contact Name: [name] Call Date: 03/16/20  Time: 1513 Phone: [phone] Outcome: Spoke with contact Important Message mailed to: Other (must enter comment)(patient declined additional copy of IM)    Dorian Renfro P Arriona Prest 03/16/2020, 3:13 PM

## 2020-03-16 NOTE — Care Management (Cosign Needed)
    Durable Medical Equipment  (From admission, onward)         Start     Ordered   03/16/20 1407  For home use only DME lightweight manual wheelchair with seat cushion  Once    Comments: Patient suffers from bilateral leg amputations and now right hand digit amputation  which impairs their ability to perform daily activities  in the home.  A cane} will not resolve  issue with performing activities of daily living. A wheelchair will allow patient to safely perform daily activities. Patient is not able to propel themselves in the home using a standard weight wheelchair due to weakness. Patient can self propel in the lightweight wheelchair. Length of need 2 months Accessories: elevating leg rests (ELRs), wheel locks, extensions and anti-tippers, back cushion   03/16/20 1410   03/16/20 1404  For home use only DME Bedside commode  Once    Question:  Patient needs a bedside commode to treat with the following condition  Answer:  Mobility impaired   03/16/20 1410   03/16/20 1032  For home use only DME Wheelchair electric  Once    Comments: Bilateral BKA, now right hand index finger amputated due to infection.   03/16/20 1032   03/16/20 0959  For home use only DME Walker platform  Once    Question Answer Comment  Patient needs a walker to treat with the following condition Mobility impaired   Patient needs a walker to treat with the following condition Finger amputee      03/16/20 1000

## 2020-03-16 NOTE — TOC Transition Note (Addendum)
Transition of Care [facility]) - CM/SW Discharge Note   Patient Details  Name: [name] MRN: [id number] Date of Birth: 21-Jul-1961  [facility] of [facility] CM/SW Contact:  Maryclare Labrador, RN Phone Number: 03/16/2020, 10:00 AM   Clinical Narrative:   Pt deemed stable for discharge home today. Pt has hx of bilateral BKA and now s/p right hand digit amputation.   CM attempted to have a conversation with pt however pt was unable discuss discharge details (flucutating orientation) - pt in agreement with platform walker and CM speaking with his wife  - pt in agreement for [employer] to provide. [employer] given referral for DME.   CM left VM for wife to call back to discuss discharge planning  Update:  Pt informed by [employer] that pt is refusing platform walker pt requesting electric wheelchair.   CM contacted Josh with [employer] to provide request for electric wheelchair (process can be initiated while hospitalized, however the process to get the DME will performed outpatient).  Per therapy PTA pt was independent with ambulation via prosthetic legs.      Update:  Therapy now informed by pt that he does not have a manual wheelchair - per therapy pt will need a manual wheelchair instead of electric due to time delay for processing.  Therapy recommending BSC and adapter for platform walker.   Update:  CM contacted by pt's  wife.  Wife is in agreement with [facility] and adaptor for walker - [employer] chosen for DME - [employer] accepts referral.  CM explained the limitation of insurance paying for both manual and electric wheelchair. Wife declined manual wheelchair and informed CM "I have one right here", wife requested to start the process for the electric wheelchair.  CM provided referral to [place] with [employer] -  [facility] to reach out to [facility] for required documentaion.  Wife declined [place] and outpt therapy for pt "stated we don't need it".   CM attempted to explain pts current mobility as communicated by therapy however wife  continued to decline.   Wife informed CM that she will make the follow up appt with PCP directly.  Wife denied all NC360 parameters.  Wife will transport pt home at discharge.    Update 1600:  Pt now oriented.  Pt confirms refusal of HH and outpt therapy.  Pt informed CM that he would call and make follow up appt with PCP. Pt declined concerns with mobilizing to HD.   Pt denied all NC360 parameters.  CM signing off             Patient Goals and CMS Choice        Discharge Placement                       Discharge Plan and Services                                     Social Determinants of Health (SDOH) Interventions     Readmission Risk Interventions No flowsheet data found.

## 2020-03-16 NOTE — Progress Notes (Signed)
Physical Therapy Treatment Patient Details Name: [name] MRN: [id number] DOB: 1961/05/12 Today's Date: 03/16/2020    History of Present Illness 59 year old gentleman with history of ESRD on hemodialysis, MWF schedule, type 2 diabetes on insulin, GERD, hepatitis C, hypertension, severe peripheral vascular disease status post bilateral below-knee amputations, peripheral vascular disease and recent right index finger infection with interphalangeal amputation presented from home with lethargy and altered mental status. Found to have gangrenous right index finger now s/p amputation.    PT saw pt earlier and he was confused and unsafe.  Notified MD.  Pt has now received Narcan.  MD request PT to return to see pt as he is discharging today.    PT Comments    Pt has received Narcan since this morning.  Per MD, confusion likely due to on HD and medications were taking a while to get out of his system - PT agrees and pt is more alert and less confused this afternoon.  He does still have some decreased safety awareness and safety with mobility.  Advised use of w/c at home until progresses with therapy or has strong assist to monitor with walking. Pt was able to demonstrate lateral transfer safely.  Discussed if w/c would not fit in bathroom recommend use of BSC.    Recommend drop arm manual w/c, platform RW, and drop arm bsc for use at home; 24 hr supervision, and HHPT.   Pt may progress quickly as current status is due to medications taking a while to get out of system due to kidney failure and on dialysis.  However, based on current mobility this is the above is therapy's recommendation.    There was discussion of a power w/c earlier - however, due to pt currently limited due to medications and demonstrating improvement - do not recommend power chair at this point.  If pt continues to have issues with mobility could pursue as outpt.  Notified SW and [employer] rep, [name].     Follow Up  Recommendations  Supervision/Assistance - 24 hour;Home health PT     Equipment Recommendations  Other (comment)(platform attachment for walker; 3:1 drop arm BSC)    Recommendations for Other Services       Precautions / Restrictions Precautions Precautions: Fall;Other (comment) Precaution Comments: bilat BKA Restrictions Weight Bearing Restrictions: No    Mobility  Bed Mobility Overal bed mobility: Needs Assistance Bed Mobility: Supine to Sit     Supine to sit: Min guard;HOB elevated Sit to supine: Min guard   General bed mobility comments: Supervision with use of bed rails and HOB elevated   Transfers Overall transfer level: Needs assistance Equipment used: Rolling walker (2 wheeled);Right platform walker Transfers: Sit to/from Stand Sit to Stand: Min assist;From elevated surface  Lateral scoot to chair: supervision using L arm and legs to scoot to and from chair         General transfer comment: attempted independently but required min A  Ambulation/Gait Ambulation/Gait assistance: Min guard Gait Distance (Feet): 25 Feet Assistive device: Right platform walker;Rolling walker (2 wheeled) Gait Pattern/deviations: Step-through pattern;Decreased stride length Gait velocity: decreased   General Gait Details: Pt able to perform step through gait and turn walker; reports weakness but denies dizziness; fatigued easily   Stairs             Wheelchair Mobility    Modified Rankin (Stroke Patients Only)       Balance Overall balance assessment: Needs assistance Sitting-balance support: Feet unsupported;Bilateral upper extremity supported Sitting  balance-Leahy Scale: Good     Standing balance support: Bilateral upper extremity supported;During functional activity Standing balance-Leahy Scale: Fair                              Cognition Arousal/Alertness: Awake/alert Behavior During Therapy: WFL for tasks assessed/performed Overall  Cognitive Status: Impaired/Different from baseline Area of Impairment: Attention;Following commands;Safety/judgement;Awareness;Problem solving                       Following Commands: Follows one step commands consistently;Follows multi-step commands inconsistently Safety/Judgement: Decreased awareness of safety;Decreased awareness of deficits   Problem Solving: Difficulty sequencing;Requires verbal cues;Requires tactile cues General Comments: Pt more alert this afternoon and able to carry on a conversation, but does still have some difficulty with reasoning (how to perform transfers safely at home)      Exercises      General Comments General comments (skin integrity, edema, etc.):   Spent increased time with pt and after discussing POC and d/c needs with MD, RN, and SW.    Pt able to don prosthesis with less assist this afternoon.  Pt is more alert and able to converse with PT.  He did have some difficulty with reasoning.  For example, had difficulty standing to walk and realizes he needed assistance, but reports at home he would just get up from w/c and walk into bathroom.  Reports would not use BSC.  Discussed he wasn't able to stand without assist and pt realized getting to bathroom would be difficult.  Discussed if going home needs min A for transfers and donning prosthesis.  Also, discussed recommendations for manual w/c, platform RW, and drop arm BSC.  Recommended use of w/c and lateral scoots unless he had strong supervision/assist for safety with walking. Pt reports does not have w/c (states had one in past but not now).  Note that when wife spoke with SW later she states they have w/c.  Also, recommend HHPT to advance.   Pt reports issue is getting to dialysis.  Has to go up/down hill to get to road to be picked up.  Advised use of w/c and having assist.    SW and RN have tried calling wife earlier this am and unable to reach.  SW and RN did speak to wife after this  therapy session and she said she could provide min A.        Pertinent Vitals/Pain Pain Assessment: No/denies pain Faces Pain Scale: Hurts little more Pain Location: R hand Pain Descriptors / Indicators: Grimacing;Guarding Pain Intervention(s): Premedicated before session    Home Living Family/patient expects to be discharged to:: Private residence Living Arrangements: Spouse/significant other Available Help at Discharge: Family Type of Home: House Home Access: Level entry   Home Layout: One level Home Equipment: Environmental consultant - 2 wheels;Bedside commode;Cane - quad;Crutches;Wheelchair - manual;Tub bench      Prior Function Level of Independence: Independent      Comments: Furniture walks with household ambulation; reports increased fatigue after dialysis days.   PT Goals (current goals can now be found in the care plan section) Acute Rehab PT Goals Patient Stated Goal: be able to go home PT Goal Formulation: With patient Time For Goal Achievement: 03/28/20 Progress towards PT goals: Progressing toward goals    Frequency    Min 3X/week      PT Plan Current plan remains appropriate;Equipment recommendations need to be updated  Co-evaluation              AM-PAC PT "6 Clicks" Mobility   Outcome Measure  Help needed turning from your back to your side while in a flat bed without using bedrails?: None Help needed moving from lying on your back to sitting on the side of a flat bed without using bedrails?: None Help needed moving to and from a bed to a chair (including a wheelchair)?: None Help needed standing up from a chair using your arms (e.g., wheelchair or bedside chair)?: A Little Help needed to walk in hospital room?: A Little Help needed climbing 3-5 steps with a railing? : A Lot 6 Click Score: 20    End of Session Equipment Utilized During Treatment: Gait belt;Other (comment) Activity Tolerance: Patient limited by fatigue Patient left: in bed;with call  bell/phone within reach;with bed alarm set Nurse Communication: Mobility status;Other (comment)(needs min A for transfers; additional communication to MD, RN, SW regarding home needs) PT Visit Diagnosis: Unsteadiness on feet (R26.81);Other abnormalities of gait and mobility (R26.89);Difficulty in walking, not elsewhere classified (R26.2);Pain Pain - Right/Left: Left Pain - part of body: Hand     Time: 1310-1344 PT Time Calculation (min) (ACUTE ONLY): 34 min  Charges:  $Gait Training: 8-22 mins $Therapeutic Activity: 8-22 mins                     Maggie Font, PT Acute Rehab Services Pager [phone] [place] [facility] [phone]    Karlton Lemon 03/16/2020, 2:41 PM

## 2020-03-16 NOTE — Progress Notes (Signed)
Patient confused and lethargic during first assessment, scheduled pain medications held and Dr. Lala Lund notified. Physician to bedside to evaluate and new orders for LR bolus obtained. Patient did not have substantial improvement in mentation post bolus, and physician notified again. Narcan given per orders, see MAR. Patient initially awake and oriented after Narcan IVP but became lethargic shortly after administration. Dr. Lala Lund notified again and back to bedside to evaluate. No new orders at that time. Physician states patient is ready for discharge and orders placed. Wife reached via telephone and agreeable to providing patient with assistance for ADL's. Care management, PT and OT all following and updating Dr. Lala Lund on patient's home care needs and their concerns for unsafe discharge plan. Wife plans on picking patient up at 1600 today.

## 2020-03-17 ENCOUNTER — Telehealth (HOSPITAL_COMMUNITY): Payer: Self-pay | Admitting: Nephrology

## 2020-03-17 LAB — AEROBIC/ANAEROBIC CULTURE W GRAM STAIN (SURGICAL/DEEP WOUND): Gram Stain: NONE SEEN

## 2020-03-17 NOTE — Telephone Encounter (Signed)
Transition of care contact from inpatient facility  Date of Discharge: 03/16/20 Date of Contact: 03/17/20 - attempted Method of contact: Phone  Attempted to contact patient to discuss transition of care from inpatient admission. Patient did not answer the phone. Unable to leave message as "mailbox is full."  Per outpatient dialysis records, he did NOT go to HD today as scheduled - called clinic and informed of transportation issue. Clinic SW informed to assist.   Veneta Penton, PA-C [facility] Pager [phone]

## 2020-03-18 ENCOUNTER — Telehealth: Payer: Self-pay | Admitting: Physician Assistant

## 2020-03-18 NOTE — Telephone Encounter (Signed)
Transitional Care Management  Date of discharge: 03/16/20 Date of contact: 03/18/20 Method of contact: Phone Spoke to patient  Patient contacted to discuss transition of care from inpatient facility. Patient was discharged on 03/18/20 from [facility] with a diagnosis of acute metabolic encephalopathy due to sepsis, 2/2 R finger infection. Very difficult to hear patient on the phone due to background noise but he reports he was not given any antibiotics at discharge. Will send keflex to his pharmacy. He reports he has a follow up appt scheduled with the hand surgeon and will call [facility] today to make sure he has transportation to dialysis tomorrow.  Anice Paganini, PA-C 03/18/2020, 12:39 PM  [facility] Pager: [phone]

## 2020-03-29 ENCOUNTER — Other Ambulatory Visit: Payer: Self-pay

## 2020-03-29 ENCOUNTER — Emergency Department (HOSPITAL_COMMUNITY)
Admission: EM | Admit: 2020-03-29 | Discharge: 2020-03-29 | Disposition: A | Discharge status: Home / Self Care | Payer: Medicare Other | Attending: Emergency Medicine | Admitting: Emergency Medicine

## 2020-03-29 ENCOUNTER — Emergency Department (HOSPITAL_COMMUNITY): Payer: Medicare Other

## 2020-03-29 ENCOUNTER — Encounter (HOSPITAL_COMMUNITY): Payer: Self-pay

## 2020-03-29 DIAGNOSIS — M7918 Myalgia, other site: Secondary | ICD-10-CM | POA: Diagnosis not present

## 2020-03-29 DIAGNOSIS — I120 Hypertensive chronic kidney disease with stage 5 chronic kidney disease or end stage renal disease: Secondary | ICD-10-CM | POA: Insufficient documentation

## 2020-03-29 DIAGNOSIS — Z87891 Personal history of nicotine dependence: Secondary | ICD-10-CM | POA: Diagnosis not present

## 2020-03-29 DIAGNOSIS — Z992 Dependence on renal dialysis: Secondary | ICD-10-CM | POA: Insufficient documentation

## 2020-03-29 DIAGNOSIS — E1122 Type 2 diabetes mellitus with diabetic chronic kidney disease: Secondary | ICD-10-CM | POA: Diagnosis not present

## 2020-03-29 DIAGNOSIS — N186 End stage renal disease: Secondary | ICD-10-CM | POA: Insufficient documentation

## 2020-03-29 DIAGNOSIS — Z794 Long term (current) use of insulin: Secondary | ICD-10-CM | POA: Diagnosis not present

## 2020-03-29 DIAGNOSIS — E1140 Type 2 diabetes mellitus with diabetic neuropathy, unspecified: Secondary | ICD-10-CM | POA: Insufficient documentation

## 2020-03-29 DIAGNOSIS — M545 Low back pain: Secondary | ICD-10-CM | POA: Diagnosis present

## 2020-03-29 DIAGNOSIS — Z79899 Other long term (current) drug therapy: Secondary | ICD-10-CM | POA: Insufficient documentation

## 2020-03-29 NOTE — Discharge Instructions (Addendum)
Your x-ray today is unremarkable, follow-up with your primary care provider regarding ongoing pain.
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
## 2020-03-29 NOTE — ED Notes (Signed)
An After Visit Summary was printed and given to the patient. Discharge instructions given and no further questions at this time.  

## 2020-04-21 ENCOUNTER — Other Ambulatory Visit (HOSPITAL_COMMUNITY)
Admission: RE | Admit: 2020-04-21 | Discharge: 2020-04-21 | Disposition: A | Discharge status: Home / Self Care | Payer: Medicare Other | Source: Ambulatory Visit | Attending: Orthopedic Surgery | Admitting: Orthopedic Surgery

## 2020-04-21 ENCOUNTER — Other Ambulatory Visit: Payer: Self-pay | Admitting: Orthopedic Surgery

## 2020-04-22 ENCOUNTER — Other Ambulatory Visit: Payer: Self-pay

## 2020-04-22 ENCOUNTER — Encounter (HOSPITAL_COMMUNITY): Payer: Self-pay | Admitting: Orthopedic Surgery

## 2020-04-22 LAB — SARS CORONAVIRUS 2 (TAT 6-24 HRS): SARS Coronavirus 2: NEGATIVE

## 2020-04-22 NOTE — Progress Notes (Signed)
Spoke with pt for pre-op call. Pt states he has a meeting online in 15 minutes, so I gave instructions only. I had spoken with pt in March for a pre-op call and he also had surgery here in April. He states nothing has changed with his medical and surgical history. Pt's most recent A1C was 7.5 on 03/10/20.   Instructed pt to take 1/2 of his regular dose of Lantus insulin tonight and again in the AM. Instructed him to check his blood sugar when he gets up and every 2 hours until he leaves for the hospital. If blood sugar is 70 or below, treat with 1/2 cup of clear juice (apple or cranberry) and recheck blood sugar 15 minutes after drinking juice. If blood sugar continues to be 70 or below, call the Short Stay department and ask to speak to a nurse. He voiced understanding.  Covid test done on 04/21/20 and it's negative. He states he has been in quarantine since he had the test done and understands that he stays in quarantine until he comes to the hospital tomorrow. He voiced understanding

## 2020-04-23 ENCOUNTER — Other Ambulatory Visit: Payer: Self-pay | Admitting: Orthopedic Surgery

## 2020-04-23 ENCOUNTER — Inpatient Hospital Stay (HOSPITAL_COMMUNITY)
Admission: RE | Admit: 2020-04-23 | Discharge: 2020-04-25 | DRG: 255 | Disposition: A | Discharge status: Home / Self Care | Payer: Medicare Other | Attending: Internal Medicine | Admitting: Internal Medicine

## 2020-04-23 DIAGNOSIS — E785 Hyperlipidemia, unspecified: Secondary | ICD-10-CM | POA: Diagnosis present

## 2020-04-23 DIAGNOSIS — Z89021 Acquired absence of right finger(s): Secondary | ICD-10-CM

## 2020-04-23 DIAGNOSIS — E1122 Type 2 diabetes mellitus with diabetic chronic kidney disease: Secondary | ICD-10-CM | POA: Diagnosis present

## 2020-04-23 DIAGNOSIS — Z833 Family history of diabetes mellitus: Secondary | ICD-10-CM

## 2020-04-23 DIAGNOSIS — Z79891 Long term (current) use of opiate analgesic: Secondary | ICD-10-CM

## 2020-04-23 DIAGNOSIS — Z87891 Personal history of nicotine dependence: Secondary | ICD-10-CM

## 2020-04-23 DIAGNOSIS — D631 Anemia in chronic kidney disease: Secondary | ICD-10-CM | POA: Diagnosis present

## 2020-04-23 DIAGNOSIS — I739 Peripheral vascular disease, unspecified: Secondary | ICD-10-CM | POA: Diagnosis present

## 2020-04-23 DIAGNOSIS — B1920 Unspecified viral hepatitis C without hepatic coma: Secondary | ICD-10-CM | POA: Diagnosis present

## 2020-04-23 DIAGNOSIS — N186 End stage renal disease: Secondary | ICD-10-CM

## 2020-04-23 DIAGNOSIS — E1140 Type 2 diabetes mellitus with diabetic neuropathy, unspecified: Secondary | ICD-10-CM | POA: Diagnosis present

## 2020-04-23 DIAGNOSIS — I10 Essential (primary) hypertension: Secondary | ICD-10-CM | POA: Diagnosis present

## 2020-04-23 DIAGNOSIS — Z79899 Other long term (current) drug therapy: Secondary | ICD-10-CM

## 2020-04-23 DIAGNOSIS — Z794 Long term (current) use of insulin: Secondary | ICD-10-CM

## 2020-04-23 DIAGNOSIS — Z8249 Family history of ischemic heart disease and other diseases of the circulatory system: Secondary | ICD-10-CM

## 2020-04-23 DIAGNOSIS — I120 Hypertensive chronic kidney disease with stage 5 chronic kidney disease or end stage renal disease: Secondary | ICD-10-CM | POA: Diagnosis present

## 2020-04-23 DIAGNOSIS — Z89511 Acquired absence of right leg below knee: Secondary | ICD-10-CM

## 2020-04-23 DIAGNOSIS — E119 Type 2 diabetes mellitus without complications: Secondary | ICD-10-CM

## 2020-04-23 DIAGNOSIS — K219 Gastro-esophageal reflux disease without esophagitis: Secondary | ICD-10-CM | POA: Diagnosis present

## 2020-04-23 DIAGNOSIS — I998 Other disorder of circulatory system: Secondary | ICD-10-CM | POA: Diagnosis present

## 2020-04-23 DIAGNOSIS — Z66 Do not resuscitate: Secondary | ICD-10-CM | POA: Diagnosis present

## 2020-04-23 DIAGNOSIS — G5601 Carpal tunnel syndrome, right upper limb: Secondary | ICD-10-CM | POA: Diagnosis present

## 2020-04-23 DIAGNOSIS — I160 Hypertensive urgency: Secondary | ICD-10-CM | POA: Diagnosis present

## 2020-04-23 DIAGNOSIS — Z992 Dependence on renal dialysis: Secondary | ICD-10-CM

## 2020-04-23 DIAGNOSIS — Z20822 Contact with and (suspected) exposure to covid-19: Secondary | ICD-10-CM | POA: Diagnosis present

## 2020-04-23 DIAGNOSIS — E1152 Type 2 diabetes mellitus with diabetic peripheral angiopathy with gangrene: Principal | ICD-10-CM | POA: Diagnosis present

## 2020-04-23 DIAGNOSIS — Z89512 Acquired absence of left leg below knee: Secondary | ICD-10-CM

## 2020-04-23 DIAGNOSIS — Z7902 Long term (current) use of antithrombotics/antiplatelets: Secondary | ICD-10-CM

## 2020-04-23 LAB — POCT I-STAT, CHEM 8
BUN: 76 mg/dL — ABNORMAL HIGH (ref 8–23)
Calcium, Ion: 1.05 mmol/L — ABNORMAL LOW (ref 1.15–1.40)
Chloride: 103 mmol/L (ref 98–111)
Creatinine, Ser: 13.2 mg/dL — ABNORMAL HIGH (ref 0.61–1.24)
Glucose, Bld: 97 mg/dL (ref 70–99)
HCT: 32 % — ABNORMAL LOW (ref 39.0–52.0)
Hemoglobin: 10.9 g/dL — ABNORMAL LOW (ref 13.0–17.0)
Potassium: 4.3 mmol/L (ref 3.5–5.1)
Sodium: 140 mmol/L (ref 135–145)
TCO2: 21 mmol/L — ABNORMAL LOW (ref 22–32)

## 2020-04-23 LAB — GLUCOSE, CAPILLARY
Glucose-Capillary: 105 mg/dL — ABNORMAL HIGH (ref 70–99)
Glucose-Capillary: 91 mg/dL (ref 70–99)
Glucose-Capillary: 92 mg/dL (ref 70–99)
Glucose-Capillary: 96 mg/dL (ref 70–99)
Glucose-Capillary: 151 mg/dL — ABNORMAL HIGH (ref 70–99)

## 2020-04-23 MED ORDER — NEPRO/CARBSTEADY PO LIQD: 237.0000 mL | Freq: Three times a day (TID) | ORAL | Status: DC | PRN

## 2020-04-23 MED ORDER — SORBITOL 70 % SOLN
30.0000 mL | Status: DC | PRN
  Filled 2020-04-23: qty 30

## 2020-04-23 MED ORDER — INSULIN GLARGINE 100 UNIT/ML ~~LOC~~ SOLN
25.0000 [IU] | Freq: Two times a day (BID) | SUBCUTANEOUS | Status: DC
  Administered 2020-04-23 – 2020-04-25 (×3): 25 [IU] via SUBCUTANEOUS
  Filled 2020-04-23 (×6): qty 0.25

## 2020-04-23 MED ORDER — FENTANYL CITRATE (PF) 100 MCG/2ML IJ SOLN
INTRAMUSCULAR | Status: AC
  Filled 2020-04-23: qty 2

## 2020-04-23 MED ORDER — ATORVASTATIN CALCIUM 10 MG PO TABS
20.0000 mg | ORAL_TABLET | Freq: Every day | ORAL | Status: DC
  Administered 2020-04-23 – 2020-04-24 (×2): 20 mg via ORAL
  Filled 2020-04-23 (×2): qty 2

## 2020-04-23 MED ORDER — CAMPHOR-MENTHOL 0.5-0.5 % EX LOTN
1.0000 "application " | TOPICAL_LOTION | Freq: Three times a day (TID) | CUTANEOUS | Status: DC | PRN
  Filled 2020-04-23: qty 222

## 2020-04-23 MED ORDER — CEFAZOLIN SODIUM-DEXTROSE 2-4 GM/100ML-% IV SOLN
INTRAVENOUS | Status: AC
  Filled 2020-04-23: qty 100

## 2020-04-23 MED ORDER — CALCIUM CARBONATE ANTACID 1250 MG/5ML PO SUSP
500.0000 mg | Freq: Four times a day (QID) | ORAL | Status: DC | PRN
  Filled 2020-04-23: qty 5

## 2020-04-23 MED ORDER — CEFAZOLIN SODIUM-DEXTROSE 2-4 GM/100ML-% IV SOLN: 2.0000 g | INTRAVENOUS | Status: AC

## 2020-04-23 MED ORDER — ONDANSETRON HCL 4 MG PO TABS: 4.0000 mg | ORAL_TABLET | Freq: Four times a day (QID) | ORAL | Status: DC | PRN

## 2020-04-23 MED ORDER — DOCUSATE SODIUM 283 MG RE ENEM
1.0000 | ENEMA | RECTAL | Status: DC | PRN
  Filled 2020-04-23: qty 1

## 2020-04-23 MED ORDER — ONDANSETRON HCL 4 MG/2ML IJ SOLN: 4.0000 mg | Freq: Four times a day (QID) | INTRAMUSCULAR | Status: DC | PRN

## 2020-04-23 MED ORDER — CHLORHEXIDINE GLUCONATE 0.12 % MT SOLN
OROMUCOSAL | Status: AC
  Filled 2020-04-23: qty 15

## 2020-04-23 MED ORDER — SODIUM CHLORIDE 0.9 % IV SOLN: INTRAVENOUS | Status: DC

## 2020-04-23 MED ORDER — CARVEDILOL 12.5 MG PO TABS
12.5000 mg | ORAL_TABLET | ORAL | Status: DC
  Administered 2020-04-24 – 2020-04-25 (×3): 12.5 mg via ORAL
  Filled 2020-04-23 (×3): qty 1

## 2020-04-23 MED ORDER — SEVELAMER CARBONATE 800 MG PO TABS
2400.0000 mg | ORAL_TABLET | Freq: Three times a day (TID) | ORAL | Status: DC
  Administered 2020-04-23 – 2020-04-25 (×4): 2400 mg via ORAL
  Filled 2020-04-23 (×6): qty 3

## 2020-04-23 MED ORDER — ACETAMINOPHEN 325 MG PO TABS
650.0000 mg | ORAL_TABLET | Freq: Four times a day (QID) | ORAL | Status: DC | PRN
  Administered 2020-04-23 – 2020-04-24 (×2): 650 mg via ORAL
  Filled 2020-04-23 (×3): qty 2

## 2020-04-23 MED ORDER — ACETAMINOPHEN 650 MG RE SUPP: 650.0000 mg | Freq: Four times a day (QID) | RECTAL | Status: DC | PRN

## 2020-04-23 MED ORDER — INSULIN ASPART 100 UNIT/ML ~~LOC~~ SOLN: 0.0000 [IU] | Freq: Three times a day (TID) | SUBCUTANEOUS | Status: DC

## 2020-04-23 MED ORDER — PREGABALIN 25 MG PO CAPS
25.0000 mg | ORAL_CAPSULE | Freq: Two times a day (BID) | ORAL | Status: DC
  Administered 2020-04-23 – 2020-04-25 (×4): 25 mg via ORAL
  Filled 2020-04-23 (×5): qty 1

## 2020-04-23 MED ORDER — OXYCODONE-ACETAMINOPHEN 5-325 MG PO TABS
1.0000 | ORAL_TABLET | Freq: Four times a day (QID) | ORAL | Status: DC | PRN
  Administered 2020-04-23 – 2020-04-25 (×4): 1 via ORAL
  Filled 2020-04-23 (×4): qty 1

## 2020-04-23 MED ORDER — GABAPENTIN 300 MG PO CAPS
300.0000 mg | ORAL_CAPSULE | Freq: Three times a day (TID) | ORAL | Status: DC
  Administered 2020-04-23 – 2020-04-24 (×3): 300 mg via ORAL
  Filled 2020-04-23 (×3): qty 1

## 2020-04-23 MED ORDER — HEPARIN SODIUM (PORCINE) 5000 UNIT/ML IJ SOLN
5000.0000 [IU] | Freq: Three times a day (TID) | INTRAMUSCULAR | Status: AC
  Administered 2020-04-23 (×2): 5000 [IU] via SUBCUTANEOUS
  Filled 2020-04-23 (×2): qty 1

## 2020-04-23 MED ORDER — MIDAZOLAM HCL 2 MG/2ML IJ SOLN
INTRAMUSCULAR | Status: AC
  Filled 2020-04-23: qty 2

## 2020-04-23 MED ORDER — FAMOTIDINE 20 MG PO TABS
20.0000 mg | ORAL_TABLET | Freq: Every day | ORAL | Status: DC
  Administered 2020-04-23 – 2020-04-25 (×2): 20 mg via ORAL
  Filled 2020-04-23 (×3): qty 1

## 2020-04-23 MED ORDER — HEPARIN SODIUM (PORCINE) 5000 UNIT/ML IJ SOLN
5000.0000 [IU] | Freq: Three times a day (TID) | INTRAMUSCULAR | Status: DC
  Administered 2020-04-24 – 2020-04-25 (×3): 5000 [IU] via SUBCUTANEOUS
  Filled 2020-04-23 (×3): qty 1

## 2020-04-23 MED ORDER — HYDROXYZINE HCL 25 MG PO TABS: 25.0000 mg | ORAL_TABLET | Freq: Three times a day (TID) | ORAL | Status: DC | PRN

## 2020-04-23 MED ORDER — CHLORHEXIDINE GLUCONATE CLOTH 2 % EX PADS
6.0000 | MEDICATED_PAD | Freq: Every day | CUTANEOUS | Status: DC
  Administered 2020-04-25: 6 via TOPICAL

## 2020-04-23 MED ORDER — CARVEDILOL 12.5 MG PO TABS: 12.5000 mg | ORAL_TABLET | ORAL | Status: DC

## 2020-04-23 MED ORDER — ZOLPIDEM TARTRATE 5 MG PO TABS
5.0000 mg | ORAL_TABLET | Freq: Every evening | ORAL | Status: DC | PRN
  Administered 2020-04-24: 5 mg via ORAL
  Filled 2020-04-23: qty 1

## 2020-04-23 MED ORDER — LACTATED RINGERS IV SOLN: INTRAVENOUS | Status: DC

## 2020-04-23 MED ORDER — POVIDONE-IODINE 10 % EX SWAB: 2.0000 "application " | Freq: Once | CUTANEOUS | Status: DC

## 2020-04-23 NOTE — Progress Notes (Signed)
Patient missed dialysis appointment on Wednesday. Informed Dr. Ola Spurr . He stated to have the patient admitted in order to receive his dialysis. Called Dr. Burney Gauze to have his office admit patient and to be dialyzed here in the hospital. Surgery will have to occur either this evening or tomorrow morning. Will continue to monitor patient.

## 2020-04-23 NOTE — Progress Notes (Signed)
Chaplain responded to Spiritual Consult.  Chaplain established relationship mindful presence with patient as he told his story of what brought him here, what his fears are now and what his hopes are for the future.  Chaplain and patient took his concerns to the [facility] in prayer.  Chaplain available upon request for additional support  De [facility]rs Chaplain Resident
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
## 2020-04-23 NOTE — Progress Notes (Addendum)
Nephrology Quick Note  Aware that admitted OBS status for dialysis prior to finger surgery --- orders placed, will be able to bring him up for dialysis later this evening.  ADDENDUM (3:12p): OP HD orders: MWF at [facility] clinic, last HD Mon 5/17 4:15hr, 200dialyzer, 425/800, EDW 94.5kg, 2K/2.5Ca, Heparin 4600.  - Calcitriol 62mcg PO q HD - Mircera 245mcg IV q 2 weeks  Plan is for HD tonight, then surgery probably tomorrow, then hopefully to d/c home afterwards.   Veneta Penton, PA-C [employer] Pager [phone]  Pt seen, examined and agree w A/P as above.  [name]  MD 04/23/2020, 3:50 PM

## 2020-04-24 ENCOUNTER — Encounter (HOSPITAL_COMMUNITY)
Admission: RE | Disposition: A | Discharge status: Home / Self Care | Payer: Self-pay | Source: Home / Self Care | Attending: Internal Medicine

## 2020-04-24 ENCOUNTER — Encounter (HOSPITAL_COMMUNITY): Payer: Self-pay | Admitting: Internal Medicine

## 2020-04-24 ENCOUNTER — Observation Stay (HOSPITAL_COMMUNITY): Payer: Medicare Other | Admitting: Anesthesiology

## 2020-04-24 DIAGNOSIS — E119 Type 2 diabetes mellitus without complications: Secondary | ICD-10-CM | POA: Diagnosis not present

## 2020-04-24 DIAGNOSIS — Z7902 Long term (current) use of antithrombotics/antiplatelets: Secondary | ICD-10-CM | POA: Diagnosis not present

## 2020-04-24 DIAGNOSIS — Z79899 Other long term (current) drug therapy: Secondary | ICD-10-CM | POA: Diagnosis not present

## 2020-04-24 DIAGNOSIS — B1920 Unspecified viral hepatitis C without hepatic coma: Secondary | ICD-10-CM | POA: Diagnosis present

## 2020-04-24 DIAGNOSIS — I998 Other disorder of circulatory system: Secondary | ICD-10-CM | POA: Diagnosis not present

## 2020-04-24 DIAGNOSIS — Z20822 Contact with and (suspected) exposure to covid-19: Secondary | ICD-10-CM | POA: Diagnosis present

## 2020-04-24 DIAGNOSIS — E785 Hyperlipidemia, unspecified: Secondary | ICD-10-CM | POA: Diagnosis present

## 2020-04-24 DIAGNOSIS — D631 Anemia in chronic kidney disease: Secondary | ICD-10-CM | POA: Diagnosis present

## 2020-04-24 DIAGNOSIS — N186 End stage renal disease: Secondary | ICD-10-CM

## 2020-04-24 DIAGNOSIS — Z87891 Personal history of nicotine dependence: Secondary | ICD-10-CM | POA: Diagnosis not present

## 2020-04-24 DIAGNOSIS — Z992 Dependence on renal dialysis: Secondary | ICD-10-CM | POA: Diagnosis not present

## 2020-04-24 DIAGNOSIS — I120 Hypertensive chronic kidney disease with stage 5 chronic kidney disease or end stage renal disease: Secondary | ICD-10-CM | POA: Diagnosis present

## 2020-04-24 DIAGNOSIS — Z833 Family history of diabetes mellitus: Secondary | ICD-10-CM | POA: Diagnosis not present

## 2020-04-24 DIAGNOSIS — Z8249 Family history of ischemic heart disease and other diseases of the circulatory system: Secondary | ICD-10-CM | POA: Diagnosis not present

## 2020-04-24 DIAGNOSIS — K219 Gastro-esophageal reflux disease without esophagitis: Secondary | ICD-10-CM | POA: Diagnosis present

## 2020-04-24 DIAGNOSIS — E1122 Type 2 diabetes mellitus with diabetic chronic kidney disease: Secondary | ICD-10-CM | POA: Diagnosis present

## 2020-04-24 DIAGNOSIS — E1140 Type 2 diabetes mellitus with diabetic neuropathy, unspecified: Secondary | ICD-10-CM | POA: Diagnosis present

## 2020-04-24 DIAGNOSIS — Z66 Do not resuscitate: Secondary | ICD-10-CM | POA: Diagnosis present

## 2020-04-24 DIAGNOSIS — Z794 Long term (current) use of insulin: Secondary | ICD-10-CM | POA: Diagnosis not present

## 2020-04-24 DIAGNOSIS — G5601 Carpal tunnel syndrome, right upper limb: Secondary | ICD-10-CM | POA: Diagnosis present

## 2020-04-24 DIAGNOSIS — Z89512 Acquired absence of left leg below knee: Secondary | ICD-10-CM | POA: Diagnosis not present

## 2020-04-24 DIAGNOSIS — I160 Hypertensive urgency: Secondary | ICD-10-CM | POA: Diagnosis present

## 2020-04-24 DIAGNOSIS — E1152 Type 2 diabetes mellitus with diabetic peripheral angiopathy with gangrene: Secondary | ICD-10-CM | POA: Diagnosis present

## 2020-04-24 DIAGNOSIS — I10 Essential (primary) hypertension: Secondary | ICD-10-CM

## 2020-04-24 DIAGNOSIS — Z89021 Acquired absence of right finger(s): Secondary | ICD-10-CM | POA: Diagnosis not present

## 2020-04-24 DIAGNOSIS — Z79891 Long term (current) use of opiate analgesic: Secondary | ICD-10-CM | POA: Diagnosis not present

## 2020-04-24 DIAGNOSIS — Z89511 Acquired absence of right leg below knee: Secondary | ICD-10-CM | POA: Diagnosis not present

## 2020-04-24 HISTORY — PX: AMPUTATION: SHX166

## 2020-04-24 LAB — CBC
HCT: 29.9 % — ABNORMAL LOW (ref 39.0–52.0)
Hemoglobin: 9.2 g/dL — ABNORMAL LOW (ref 13.0–17.0)
MCH: 24.3 pg — ABNORMAL LOW (ref 26.0–34.0)
MCHC: 30.8 g/dL (ref 30.0–36.0)
MCV: 78.9 fL — ABNORMAL LOW (ref 80.0–100.0)
Platelets: 224 10*3/uL (ref 150–400)
RBC: 3.79 MIL/uL — ABNORMAL LOW (ref 4.22–5.81)
RDW: 19 % — ABNORMAL HIGH (ref 11.5–15.5)
WBC: 9 10*3/uL (ref 4.0–10.5)
nRBC: 0 % (ref 0.0–0.2)

## 2020-04-24 LAB — RENAL FUNCTION PANEL
Albumin: 3.3 g/dL — ABNORMAL LOW (ref 3.5–5.0)
Anion gap: 21 — ABNORMAL HIGH (ref 5–15)
BUN: 36 mg/dL — ABNORMAL HIGH (ref 8–23)
CO2: 22 mmol/L (ref 22–32)
Calcium: 8.3 mg/dL — ABNORMAL LOW (ref 8.9–10.3)
Chloride: 95 mmol/L — ABNORMAL LOW (ref 98–111)
Creatinine, Ser: 5.65 mg/dL — ABNORMAL HIGH (ref 0.61–1.24)
GFR calc Af Amer: 12 mL/min — ABNORMAL LOW (ref >60)
GFR calc non Af Amer: 10 mL/min — ABNORMAL LOW (ref >60)
Glucose, Bld: 100 mg/dL — ABNORMAL HIGH (ref 70–99)
Phosphorus: 3 mg/dL (ref 2.5–4.6)
Potassium: 3.3 mmol/L — ABNORMAL LOW (ref 3.5–5.1)
Sodium: 138 mmol/L (ref 135–145)

## 2020-04-24 LAB — GLUCOSE, CAPILLARY
Glucose-Capillary: 98 mg/dL (ref 70–99)
Glucose-Capillary: 91 mg/dL (ref 70–99)
Glucose-Capillary: 97 mg/dL (ref 70–99)
Glucose-Capillary: 117 mg/dL — ABNORMAL HIGH (ref 70–99)
Glucose-Capillary: 166 mg/dL — ABNORMAL HIGH (ref 70–99)

## 2020-04-24 SURGERY — AMPUTATION DIGIT
Anesthesia: General | Laterality: Right

## 2020-04-24 MED ORDER — HYDRALAZINE HCL 25 MG PO TABS
25.0000 mg | ORAL_TABLET | Freq: Three times a day (TID) | ORAL | Status: DC | PRN
  Administered 2020-04-24: 25 mg via ORAL
  Filled 2020-04-24: qty 1

## 2020-04-24 MED ORDER — LIDOCAINE 2% (20 MG/ML) 5 ML SYRINGE
INTRAMUSCULAR | Status: DC | PRN
  Administered 2020-04-24: 100 mg via INTRAVENOUS

## 2020-04-24 MED ORDER — PHENYLEPHRINE 40 MCG/ML (10ML) SYRINGE FOR IV PUSH (FOR BLOOD PRESSURE SUPPORT)
PREFILLED_SYRINGE | INTRAVENOUS | Status: DC | PRN
  Administered 2020-04-24: 80 ug via INTRAVENOUS
  Administered 2020-04-24: 120 ug via INTRAVENOUS
  Administered 2020-04-24: 80 ug via INTRAVENOUS
  Administered 2020-04-24: 120 ug via INTRAVENOUS

## 2020-04-24 MED ORDER — DEXAMETHASONE SODIUM PHOSPHATE 10 MG/ML IJ SOLN
INTRAMUSCULAR | Status: AC
  Filled 2020-04-24: qty 1

## 2020-04-24 MED ORDER — OXYCODONE-ACETAMINOPHEN 5-325 MG PO TABS
1.0000 | ORAL_TABLET | Freq: Once | ORAL | Status: AC
  Administered 2020-04-24: 1 via ORAL
  Filled 2020-04-24: qty 1

## 2020-04-24 MED ORDER — HEPARIN SODIUM (PORCINE) 1000 UNIT/ML DIALYSIS: 1000.0000 [IU] | INTRAMUSCULAR | Status: DC | PRN

## 2020-04-24 MED ORDER — ONDANSETRON HCL 4 MG/2ML IJ SOLN: 4.0000 mg | Freq: Once | INTRAMUSCULAR | Status: DC | PRN

## 2020-04-24 MED ORDER — MIDAZOLAM HCL 2 MG/2ML IJ SOLN
INTRAMUSCULAR | Status: AC
  Filled 2020-04-24: qty 2

## 2020-04-24 MED ORDER — BUPIVACAINE-EPINEPHRINE 0.5% -1:200000 IJ SOLN
INTRAMUSCULAR | Status: AC
  Filled 2020-04-24: qty 1

## 2020-04-24 MED ORDER — BUPIVACAINE-EPINEPHRINE (PF) 0.5% -1:200000 IJ SOLN
INTRAMUSCULAR | Status: DC | PRN
  Administered 2020-04-24: 20 mL

## 2020-04-24 MED ORDER — ONDANSETRON HCL 4 MG/2ML IJ SOLN
INTRAMUSCULAR | Status: AC
  Filled 2020-04-24: qty 2

## 2020-04-24 MED ORDER — CEFAZOLIN SODIUM-DEXTROSE 2-3 GM-%(50ML) IV SOLR
INTRAVENOUS | Status: DC | PRN
  Administered 2020-04-24: 2 g via INTRAVENOUS

## 2020-04-24 MED ORDER — LIDOCAINE HCL (PF) 1 % IJ SOLN: 5.0000 mL | INTRAMUSCULAR | Status: DC | PRN

## 2020-04-24 MED ORDER — PHENYLEPHRINE HCL (PRESSORS) 10 MG/ML IV SOLN: INTRAVENOUS | Status: DC | PRN

## 2020-04-24 MED ORDER — FENTANYL CITRATE (PF) 250 MCG/5ML IJ SOLN
INTRAMUSCULAR | Status: AC
  Filled 2020-04-24: qty 5

## 2020-04-24 MED ORDER — ALTEPLASE 2 MG IJ SOLR: 2.0000 mg | Freq: Once | INTRAMUSCULAR | Status: DC | PRN

## 2020-04-24 MED ORDER — FENTANYL CITRATE (PF) 100 MCG/2ML IJ SOLN
INTRAMUSCULAR | Status: DC | PRN
  Administered 2020-04-24: 50 ug via INTRAVENOUS

## 2020-04-24 MED ORDER — SODIUM CHLORIDE 0.9 % IV SOLN: 100.0000 mL | INTRAVENOUS | Status: DC | PRN

## 2020-04-24 MED ORDER — PROPOFOL 10 MG/ML IV BOLUS
INTRAVENOUS | Status: AC
  Filled 2020-04-24: qty 20

## 2020-04-24 MED ORDER — PENTAFLUOROPROP-TETRAFLUOROETH EX AERO: 1.0000 "application " | INHALATION_SPRAY | CUTANEOUS | Status: DC | PRN

## 2020-04-24 MED ORDER — PHENYLEPHRINE 40 MCG/ML (10ML) SYRINGE FOR IV PUSH (FOR BLOOD PRESSURE SUPPORT)
PREFILLED_SYRINGE | INTRAVENOUS | Status: AC
  Filled 2020-04-24: qty 10

## 2020-04-24 MED ORDER — 0.9 % SODIUM CHLORIDE (POUR BTL) OPTIME
TOPICAL | Status: DC | PRN
  Administered 2020-04-24: 1000 mL

## 2020-04-24 MED ORDER — LIDOCAINE 2% (20 MG/ML) 5 ML SYRINGE
INTRAMUSCULAR | Status: AC
  Filled 2020-04-24: qty 5

## 2020-04-24 MED ORDER — CEFAZOLIN SODIUM-DEXTROSE 2-4 GM/100ML-% IV SOLN
INTRAVENOUS | Status: AC
  Filled 2020-04-24: qty 100

## 2020-04-24 MED ORDER — SODIUM CHLORIDE 0.9 % IV SOLN: INTRAVENOUS | Status: DC | PRN

## 2020-04-24 MED ORDER — PHENYLEPHRINE HCL-NACL 10-0.9 MG/250ML-% IV SOLN
INTRAVENOUS | Status: DC | PRN
  Administered 2020-04-24: 50 ug/min via INTRAVENOUS

## 2020-04-24 MED ORDER — FENTANYL CITRATE (PF) 100 MCG/2ML IJ SOLN: 25.0000 ug | INTRAMUSCULAR | Status: DC | PRN

## 2020-04-24 MED ORDER — GABAPENTIN 300 MG PO CAPS: 300.0000 mg | ORAL_CAPSULE | ORAL | Status: DC

## 2020-04-24 MED ORDER — DEXAMETHASONE SODIUM PHOSPHATE 10 MG/ML IJ SOLN
INTRAMUSCULAR | Status: DC | PRN
  Administered 2020-04-24: 4 mg via INTRAVENOUS

## 2020-04-24 MED ORDER — LIDOCAINE-PRILOCAINE 2.5-2.5 % EX CREA: 1.0000 "application " | TOPICAL_CREAM | CUTANEOUS | Status: DC | PRN

## 2020-04-24 MED ORDER — ACETAMINOPHEN 325 MG PO TABS
ORAL_TABLET | ORAL | Status: AC
  Filled 2020-04-24: qty 2

## 2020-04-24 MED ORDER — PROPOFOL 10 MG/ML IV BOLUS
INTRAVENOUS | Status: DC | PRN
  Administered 2020-04-24: 50 mg via INTRAVENOUS
  Administered 2020-04-24: 80 mg via INTRAVENOUS

## 2020-04-24 MED ORDER — ONDANSETRON HCL 4 MG/2ML IJ SOLN
INTRAMUSCULAR | Status: DC | PRN
  Administered 2020-04-24: 4 mg via INTRAVENOUS

## 2020-04-24 SURGICAL SUPPLY — 50 items
BLADE SURG 15 STRL LF DISP TIS (BLADE) IMPLANT
BLADE SURG 15 STRL SS (BLADE) ×9
BNDG CMPR 9X4 STRL LF SNTH (GAUZE/BANDAGES/DRESSINGS) ×1
BNDG COHESIVE 1X5 TAN STRL LF (GAUZE/BANDAGES/DRESSINGS) IMPLANT
BNDG CONFORM 2 STRL LF (GAUZE/BANDAGES/DRESSINGS) IMPLANT
BNDG ELASTIC 3X5.8 VLCR STR LF (GAUZE/BANDAGES/DRESSINGS) ×2 IMPLANT
BNDG ELASTIC 4X5.8 VLCR STR LF (GAUZE/BANDAGES/DRESSINGS) ×2 IMPLANT
BNDG ESMARK 4X9 LF (GAUZE/BANDAGES/DRESSINGS) ×3 IMPLANT
BNDG GAUZE ELAST 4 BULKY (GAUZE/BANDAGES/DRESSINGS) ×2 IMPLANT
CLOSURE WOUND 1/2 X4 (GAUZE/BANDAGES/DRESSINGS)
CORD BIPOLAR FORCEPS 12FT (ELECTRODE) ×3 IMPLANT
COVER SURGICAL LIGHT HANDLE (MISCELLANEOUS) ×3 IMPLANT
CUFF TOURN SGL QUICK 18X4 (TOURNIQUET CUFF) ×2 IMPLANT
DECANTER SPIKE VIAL GLASS SM (MISCELLANEOUS) ×2 IMPLANT
DRAPE SURG 17X23 STRL (DRAPES) ×3 IMPLANT
ELECT NDL BLADE 2-5/6 (NEEDLE) IMPLANT
ELECT NEEDLE BLADE 2-5/6 (NEEDLE) ×3 IMPLANT
GAUZE SPONGE 2X2 8PLY STRL LF (GAUZE/BANDAGES/DRESSINGS) IMPLANT
GAUZE SPONGE 4X4 12PLY STRL (GAUZE/BANDAGES/DRESSINGS) ×2 IMPLANT
GAUZE XEROFORM 1X8 LF (GAUZE/BANDAGES/DRESSINGS) IMPLANT
GAUZE XEROFORM 5X9 LF (GAUZE/BANDAGES/DRESSINGS) ×2 IMPLANT
GLOVE SURG SYN 8.0 (GLOVE) ×6 IMPLANT
GLOVE SURG SYN 8.0 PF PI (GLOVE) ×1 IMPLANT
GOWN STRL REUS W/ TWL LRG LVL3 (GOWN DISPOSABLE) ×1 IMPLANT
GOWN STRL REUS W/ TWL XL LVL3 (GOWN DISPOSABLE) ×1 IMPLANT
GOWN STRL REUS W/TWL LRG LVL3 (GOWN DISPOSABLE) ×3
GOWN STRL REUS W/TWL XL LVL3 (GOWN DISPOSABLE) ×3
KIT BASIN OR (CUSTOM PROCEDURE TRAY) ×3 IMPLANT
KIT TURNOVER KIT B (KITS) ×3 IMPLANT
NDL HYPO 25GX1X1/2 BEV (NEEDLE) IMPLANT
NEEDLE HYPO 25GX1X1/2 BEV (NEEDLE) ×3 IMPLANT
NS IRRIG 1000ML POUR BTL (IV SOLUTION) ×3 IMPLANT
PACK ORTHO EXTREMITY (CUSTOM PROCEDURE TRAY) ×3 IMPLANT
PAD ARMBOARD 7.5X6 YLW CONV (MISCELLANEOUS) ×6 IMPLANT
PAD CAST 3X4 CTTN HI CHSV (CAST SUPPLIES) IMPLANT
PAD CAST 4YDX4 CTTN HI CHSV (CAST SUPPLIES) IMPLANT
PADDING CAST COTTON 3X4 STRL (CAST SUPPLIES) ×3
PADDING CAST COTTON 4X4 STRL (CAST SUPPLIES) ×3
SOL PREP POV-IOD 4OZ 10% (MISCELLANEOUS) ×4 IMPLANT
SPECIMEN JAR SMALL (MISCELLANEOUS) ×1 IMPLANT
SPONGE GAUZE 2X2 STER 10/PKG (GAUZE/BANDAGES/DRESSINGS)
STRIP CLOSURE SKIN 1/2X4 (GAUZE/BANDAGES/DRESSINGS) IMPLANT
SUT MNCRL AB 4-0 PS2 18 (SUTURE) ×10 IMPLANT
SYR CONTROL 10ML LL (SYRINGE) ×2 IMPLANT
TOWEL GREEN STERILE (TOWEL DISPOSABLE) ×3 IMPLANT
TOWEL GREEN STERILE FF (TOWEL DISPOSABLE) ×3 IMPLANT
TUBE CONNECTING 12'X1/4 (SUCTIONS) ×1
TUBE CONNECTING 12X1/4 (SUCTIONS) ×1 IMPLANT
UNDERPAD 30X36 HEAVY ABSORB (UNDERPADS AND DIAPERS) ×3 IMPLANT
WATER STERILE IRR 1000ML POUR (IV SOLUTION) ×3 IMPLANT

## 2020-04-24 NOTE — Progress Notes (Signed)
PROGRESS NOTE    [name]  TDV:[id number] DOB: 1961-03-29 DOA: 04/23/2020 PCP: [name], FNP  No chief complaint on file.   Brief Narrative:  59 year old male prior history of peripheral vascular disease s/p bilateral BKA, diabetes, hypertension, end-stage renal disease on dialysis on Monday Wednesday Friday presents for left hand surgery with amputation.  He reports severe pain in his right second and third finger associated with chronic hand ischemia and gangrene.  Orthopedics consulted and he underwent debridement on 04/24/2020. Earlier this morning patient had a fever of 100.9, blood cultures have been ordered.     Assessment & Plan:   Principal Problem:   Ischemia of finger Active Problems:   Malignant hypertension   End stage renal disease on dialysis ([place])   HLD (hyperlipidemia)   Diabetes mellitus type 2 in nonobese (HCC)   Ischemia right hand/second and third right distal fingers gangrene Significant vasculopathy presented with second and third right distal finger amputations due to gangrene Orthopedics consulted and he underwent right index finger webspace debridement with long and ring finger metacarpal phalanx joint level amputations. Pain control and PT/OT eval ordered. Continue with Lyrica and pain control with Percocet     Hypertensive urgency Resolved.  End-stage renal disease on dialysis (Monday Wednesday Friday. Nephrology on board and he underwent dialysis this morning. Resume home medications.    Diabetes mellitus Last A1c was 7.5 CBG (last 3)  Recent Labs    04/24/20 0748 04/24/20 1012 04/24/20 1128  GLUCAP 98 91 97   Continue with SSI.    Peripheral vascular disease Resume home medications   DVT prophylaxis: Heparin Code Status: Full code Family Communication: None at bedside Disposition:   Status is: Observation  The patient will require care spanning > 2 midnights and should be moved to inpatient because:  Ongoing diagnostic testing needed not appropriate for outpatient work up  Dispo: The patient is from: Home              Anticipated d/c is to: Pending              Anticipated d/c date is: 1 day              Patient currently is not medically stable to d/c.       Consultants:   Orthopedics  Nephrology  Procedures: S/p right index webspace debridement, long and ring metacarpal phalanx joint level amputations with advancement flaps to cover the defect in the webspace on 04/24/2020 by Dr. Burney Gauze Hemodialysis. Antimicrobials: None none  Subjective: Appears sleepy sedated postprocedure  Objective: Vitals:   04/24/20 1007 04/24/20 1022 04/24/20 1037 04/24/20 1047  BP: (!) 104/38 (!) 114/38 (!) 102/56   Pulse: 89 89 89   Resp: 10 10 12    Temp: (!) 100.9 F (38.3 C)   99.3 F (37.4 C)  TempSrc:      SpO2: 100% 100% 98%   Weight:        Intake/Output Summary (Last 24 hours) at 04/24/2020 1352 Last data filed at 04/24/2020 1046 Gross per 24 hour  Intake 1050 ml  Output 2003 ml  Net -953 ml   Filed Weights   04/24/20 0251 04/24/20 0715  Weight: 95.3 kg 92.9 kg    Examination:  General exam: Appears calm and comfortable not in any kind of distress Respiratory system: Clear to auscultation. Respiratory effort normal.  No wheezing or rhonchi Cardiovascular system: S1 & S2 heard, RRR. No JVD, Gastrointestinal system: Abdomen is nondistended, soft and  nontender. Normal bowel sounds heard. Central nervous system: Sedated briefly wakes up to verbal commands, able to move all extremities. Extremities: Bilateral BKA, right upper extremity bandaged Skin: No rashes seen Psychiatry: cannot be assessed.     Data Reviewed: I have personally reviewed following labs and imaging studies  CBC: Recent Labs  Lab 04/23/20 1142 04/24/20 0600  WBC  --  9.0  HGB 10.9* 9.2*  HCT 32.0* 29.9*  MCV  --  78.9*  PLT  --  161    Basic Metabolic Panel: Recent Labs  Lab  04/23/20 1142 04/24/20 0601  NA 140 138  K 4.3 3.3*  CL 103 95*  CO2  --  22  GLUCOSE 97 100*  BUN 76* 36*  CREATININE 13.20* 5.65*  CALCIUM  --  8.3*  PHOS  --  3.0    GFR: Estimated Creatinine Clearance: 16.9 mL/min (A) (by C-G formula based on SCr of 5.65 mg/dL (H)).  Liver Function Tests: Recent Labs  Lab 04/24/20 0601  ALBUMIN 3.3*    CBG: Recent Labs  Lab 04/23/20 1600 04/23/20 2105 04/24/20 0748 04/24/20 1012 04/24/20 1128  GLUCAP 96 151* 98 91 97     Recent Results (from the past 240 hour(s))  SARS CORONAVIRUS 2 (TAT 6-24 HRS) Nasopharyngeal Nasopharyngeal Swab     Status: None   Collection Time: 04/21/20  1:40 PM   Specimen: Nasopharyngeal Swab  Result Value Ref Range Status   SARS Coronavirus 2 NEGATIVE NEGATIVE Final    Comment: (NOTE) SARS-CoV-2 target nucleic acids are NOT DETECTED. The SARS-CoV-2 RNA is generally detectable in upper and lower respiratory specimens during the acute phase of infection. Negative results do not preclude SARS-CoV-2 infection, do not rule out co-infections with other pathogens, and should not be used as the sole basis for treatment or other patient management decisions. Negative results must be combined with clinical observations, patient history, and epidemiological information. The expected result is Negative. Fact Sheet for Patients: SugarRoll.be Fact Sheet for Healthcare Providers: https://www.woods-mathews.com/ This test is not yet approved or cleared by the [place] FDA and  has been authorized for detection and/or diagnosis of SARS-CoV-2 by FDA under an Emergency Use Authorization (EUA). This EUA will remain  in effect (meaning this test can be used) for the duration of the COVID-19 declaration under Section 56 4(b)(1) of the Act, 21 U.S.C. section 360bbb-3(b)(1), unless the authorization is terminated or revoked sooner. Performed at [facility],  [place] [address], [place], [place] [address]          Radiology Studies: No results found.      Scheduled Meds: . acetaminophen      . atorvastatin  20 mg Oral QHS  . [START ON 04/26/2020] carvedilol  12.5 mg Oral Once per day on Mon Wed Fri  . carvedilol  12.5 mg Oral 2 times per day on Sun Tue Thu Sat  . Chlorhexidine Gluconate Cloth  6 each Topical Q0600  . famotidine  20 mg Oral Daily  . gabapentin  300 mg Oral TID  . heparin  5,000 Units Subcutaneous Q8H  . insulin aspart  0-6 Units Subcutaneous TID WC  . insulin glargine  25 Units Subcutaneous BID  . povidone-iodine  2 application Topical Once  . pregabalin  25 mg Oral BID  . sevelamer carbonate  2,400 mg Oral TID WC   Continuous Infusions: . ceFAZolin       LOS: 1 day        [place]  Karleen Hampshire, MD [facility]   To contact the attending provider between 7A-7P or the covering provider during after hours 7P-7A, please log into the web site [email or web address] and access using universal [place] password for that web site. If you do not have the password, please call the hospital operator.  04/24/2020, 1:52 PM

## 2020-04-24 NOTE — Op Note (Signed)
Patient was taken to the operating suite and after induction of adequate general anesthetic the right upper extremity was prepped and draped in the usual sterile fashion.  A forearm tourniquet had been applied and the inflated this to 225 mmHg.  This point time the right hand was approached surgically.  The patient had a previous reputation index finger and part of the webspace flap was necrotic.  This is debrided of a large amount of eschar.  Once this is done we proceeded with metacarpophalangeal joint level amputations of the long and ring fingers on the right hand.  We did this by forming dorsal and volar flaps and removing the ischemic digits from the little the metacarpal phalangeal joint distally for both the dorsal and palmar approach.  We performed bilateral neurectomies of both of the digits.  Once the digits removed there was sent for pathologic confirmation.  We thoroughly irrigated.  We then were able to advance the long finger flap to cover some the defect created by the debridement of the webspace wound.  We were able to close the metacarpal heads in the webspace wound using the dorsal and palmar flaps with 4-0 Monocryl.  We then dressed with Xeroform, 4 x 4's, and a compressive hand dressing.  The patient tolerated these procedures well went to recovery in stable fashion.

## 2020-04-24 NOTE — Transfer of Care (Signed)
Immediate Anesthesia Transfer of Care Note  Patient: [name]  Procedure(s) Performed: AMPUTATION DIGIT RIGHT LONG FINGER AND RIGHT RING FINGER (Right )  Patient Location: PACU  Anesthesia Type:General  Level of Consciousness: drowsy and patient cooperative  Airway & Oxygen Therapy: Patient Spontanous Breathing and Patient connected to nasal cannula oxygen  Post-op Assessment: Report given to RN and Post -op Vital signs reviewed and stable  Post vital signs: Reviewed and stable  Last Vitals:  Vitals Value Taken Time  BP    Temp    Pulse    Resp    SpO2      Last Pain:  Vitals:   04/24/20 0749  TempSrc: Oral  PainSc:       Patients Stated Pain Goal: 0 (47/18/55 0158)  Complications: No apparent anesthesia complications

## 2020-04-24 NOTE — Anesthesia Procedure Notes (Signed)
Procedure Name: LMA Insertion Date/Time: 04/24/2020 8:33 AM Performed by: Moshe Salisbury, CRNA Pre-anesthesia Checklist: Patient identified, Emergency Drugs available, Suction available and Patient being monitored Patient Re-evaluated:Patient Re-evaluated prior to induction Oxygen Delivery Method: Circle System Utilized Preoxygenation: Pre-oxygenation with 100% oxygen Induction Type: IV induction Ventilation: Mask ventilation without difficulty LMA: LMA inserted LMA Size: 5.0 Number of attempts: 1 Placement Confirmation: positive ETCO2 Tube secured with: Tape Dental Injury: Teeth and Oropharynx as per pre-operative assessment

## 2020-04-24 NOTE — Progress Notes (Signed)
Shift summary: During this shift patient remained a/o*4 to questions but noted periods of patient not quite coherently present in the moment and though eyes were awake patient name had to be called again to draw attention back to this staff nurse.No distress noted. Dialysis called before the hour of 2300 to determine when pt was to receive dialysis and made aware that patient had surgical procedure the following day. Patient remained NPO after midnight for this nurse. O.R. called and was made aware before the hour of 0600 that patient was in dialysis. Dialysis called after bedside report to give update on patient-referred to a.m. nurse.
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
## 2020-04-24 NOTE — Brief Op Note (Signed)
04/24/2020  10:01 AM  PATIENT:  [name]  59 y.o. male  PRE-OPERATIVE DIAGNOSIS:  RIGHT LONG FINGER AND RIGHT RING FINGER GANGRENE  POST-OPERATIVE DIAGNOSIS:  RIGHT LONG FINGER AND RIGHT RING FINGER GANGRENE  PROCEDURE:  Procedure(s): AMPUTATION DIGIT RIGHT LONG FINGER AND RIGHT RING FINGER (Right)  SURGEON:  Surgeon(s) and Role:    Charlotte Crumb, MD - Primary  PHYSICIAN ASSISTANT:   ASSISTANTS: none   ANESTHESIA:   general  EBL: Less than 50 cc BLOOD ADMINISTERED:none  DRAINS: none   LOCAL MEDICATIONS USED:  MARCAINE     SPECIMEN:  Biopsy / Limited Resection  DISPOSITION OF SPECIMEN:  PATHOLOGY  COUNTS:  YES  TOURNIQUET:   Total Tourniquet Time Documented: Forearm (Right) - 59 minutes Total: Forearm (Right) - 59 minutes   DICTATION: .Viviann Spare Dictation  PLAN OF CARE: Admit for overnight observation  PATIENT DISPOSITION:  PACU - hemodynamically stable.   Delay start of Pharmacological VTE agent (>24hrs) due to surgical blood loss or risk of bleeding: yes

## 2020-04-24 NOTE — Anesthesia Postprocedure Evaluation (Signed)
Anesthesia Post Note  Patient: [name]  Procedure(s) Performed: AMPUTATION DIGIT RIGHT LONG FINGER AND RIGHT RING FINGER (Right )     Patient location during evaluation: PACU Anesthesia Type: General Level of consciousness: awake and alert Pain management: pain level controlled Vital Signs Assessment: post-procedure vital signs reviewed and stable Respiratory status: spontaneous breathing, nonlabored ventilation, respiratory function stable and patient connected to nasal cannula oxygen Cardiovascular status: blood pressure returned to baseline and stable Postop Assessment: no apparent nausea or vomiting Anesthetic complications: no    Last Vitals:  Vitals:   04/24/20 1037 04/24/20 1047  BP: (!) 102/56   Pulse: 89   Resp: 12   Temp:  37.4 C  SpO2: 98%     Last Pain:  Vitals:   04/24/20 1037  TempSrc:   PainSc: 0-No pain                 Catalina Gravel

## 2020-04-24 NOTE — Anesthesia Preprocedure Evaluation (Addendum)
Anesthesia Evaluation  Patient identified by MRN, date of birth, ID band Patient awake    Reviewed: Allergy & Precautions, NPO status , Patient's Chart, lab work & pertinent test results, reviewed documented beta blocker date and time   History of Anesthesia Complications Negative for: history of anesthetic complications  Airway Mallampati: II       Dental  (+) Partial Upper, Missing,    Pulmonary former smoker,    breath sounds clear to auscultation       Cardiovascular hypertension, Pt. on medications and Pt. on home beta blockers + Peripheral Vascular Disease   Rhythm:Regular Rate:Normal     Neuro/Psych negative neurological ROS  negative psych ROS   GI/Hepatic GERD  ,(+) Hepatitis -, C  Endo/Other  diabetes, Poorly Controlled, Type 2, Insulin Dependent  Renal/GU ESRF and DialysisRenal diseaseMWF Dialysis, will undergo dialysis prior to surgery  negative genitourinary   Musculoskeletal RIGHT LONG FINGER AND RIGHT RING FINGER GANGRENE   Abdominal (+) - obese,   Peds  Hematology  (+) Blood dyscrasia, anemia , hct 26.5   Anesthesia Other Findings   Reproductive/Obstetrics negative OB ROS                           Anesthesia Physical  Anesthesia Plan  ASA: IV  Anesthesia Plan: General   Post-op Pain Management:    Induction: Intravenous  PONV Risk Score and Plan: 2 and Treatment may vary due to age or medical condition, Ondansetron and Dexamethasone  Airway Management Planned: LMA  Additional Equipment:   Intra-op Plan:   Post-operative Plan: Extubation in OR  Informed Consent: I have reviewed the patients History and Physical, chart, labs and discussed the procedure including the risks, benefits and alternatives for the proposed anesthesia with the patient or authorized representative who has indicated his/her understanding and acceptance.   Patient has DNR.  Suspend DNR  and Discussed DNR with patient.   Dental advisory given  Plan Discussed with: CRNA  Anesthesia Plan Comments:        Anesthesia Quick Evaluation

## 2020-04-24 NOTE — Progress Notes (Signed)
Pt c/o pain after administration of pain medication. Contacted the provider. Oxycodone 5-325 one time dose was ordered. Pt. Has oxycodone PRN.  Also provider held lantus as BS have been soft throughout the day.

## 2020-04-24 NOTE — Progress Notes (Signed)
Patient underwent right index webspace debridement and long and ring metacarpal phalange joint level amputations with advancement flaps to cover defect in the webspace.  Patient can be discharged from my point of view if he is medically stable.  Patient will need p.o. Percocet 5 mg 20 with no refills.  Patient will leave his dressing alone until I see him this Tuesday.  Please call our office number at [phone] for any questions regarding his care.
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
## 2020-04-25 LAB — CBC WITH DIFFERENTIAL/PLATELET
Abs Immature Granulocytes: 0.06 10*3/uL (ref 0.00–0.07)
Basophils Absolute: 0 10*3/uL (ref 0.0–0.1)
Basophils Relative: 1 %
Eosinophils Absolute: 0.2 10*3/uL (ref 0.0–0.5)
Eosinophils Relative: 2 %
HCT: 27.3 % — ABNORMAL LOW (ref 39.0–52.0)
Hemoglobin: 8.2 g/dL — ABNORMAL LOW (ref 13.0–17.0)
Immature Granulocytes: 1 %
Lymphocytes Relative: 27 %
Lymphs Abs: 2.4 10*3/uL (ref 0.7–4.0)
MCH: 24.3 pg — ABNORMAL LOW (ref 26.0–34.0)
MCHC: 30 g/dL (ref 30.0–36.0)
MCV: 80.8 fL (ref 80.0–100.0)
Monocytes Absolute: 1.3 10*3/uL — ABNORMAL HIGH (ref 0.1–1.0)
Monocytes Relative: 15 %
Neutro Abs: 4.9 10*3/uL (ref 1.7–7.7)
Neutrophils Relative %: 54 %
Platelets: 219 10*3/uL (ref 150–400)
RBC: 3.38 MIL/uL — ABNORMAL LOW (ref 4.22–5.81)
RDW: 19.2 % — ABNORMAL HIGH (ref 11.5–15.5)
WBC: 8.9 10*3/uL (ref 4.0–10.5)
nRBC: 0 % (ref 0.0–0.2)

## 2020-04-25 LAB — GLUCOSE, CAPILLARY
Glucose-Capillary: 129 mg/dL — ABNORMAL HIGH (ref 70–99)
Glucose-Capillary: 102 mg/dL — ABNORMAL HIGH (ref 70–99)

## 2020-04-25 LAB — BASIC METABOLIC PANEL
Anion gap: 17 — ABNORMAL HIGH (ref 5–15)
BUN: 64 mg/dL — ABNORMAL HIGH (ref 8–23)
CO2: 24 mmol/L (ref 22–32)
Calcium: 7.6 mg/dL — ABNORMAL LOW (ref 8.9–10.3)
Chloride: 97 mmol/L — ABNORMAL LOW (ref 98–111)
Creatinine, Ser: 8.93 mg/dL — ABNORMAL HIGH (ref 0.61–1.24)
GFR calc Af Amer: 7 mL/min — ABNORMAL LOW (ref >60)
GFR calc non Af Amer: 6 mL/min — ABNORMAL LOW (ref >60)
Glucose, Bld: 119 mg/dL — ABNORMAL HIGH (ref 70–99)
Potassium: 3.9 mmol/L (ref 3.5–5.1)
Sodium: 138 mmol/L (ref 135–145)

## 2020-04-25 MED ORDER — NEPRO/CARBSTEADY PO LIQD: 237.0000 mL | Freq: Three times a day (TID) | ORAL | 35 refills | Status: DC | PRN

## 2020-04-25 MED ORDER — OXYCODONE-ACETAMINOPHEN 5-325 MG PO TABS: 1.0000 | ORAL_TABLET | Freq: Three times a day (TID) | ORAL | 0 refills | Status: AC | PRN

## 2020-04-25 MED ORDER — OXYCODONE-ACETAMINOPHEN 5-325 MG PO TABS
1.0000 | ORAL_TABLET | Freq: Four times a day (QID) | ORAL | Status: DC | PRN
  Administered 2020-04-25: 2 via ORAL
  Filled 2020-04-25 (×2): qty 2

## 2020-04-25 MED ORDER — OXYCODONE-ACETAMINOPHEN 5-325 MG PO TABS
1.0000 | ORAL_TABLET | ORAL | Status: DC | PRN
  Administered 2020-04-25: 2 via ORAL

## 2020-04-25 MED ORDER — CALCIUM CARBONATE ANTACID 1250 MG/5ML PO SUSP: 500.0000 mg | Freq: Four times a day (QID) | ORAL | Status: DC | PRN

## 2020-04-25 NOTE — Progress Notes (Signed)
[facility] Progress Note  Subjective: in good spirits, found his phoene  Vitals:   04/24/20 1047 04/24/20 1511 04/24/20 2127 04/25/20 0828  BP:  (!) 194/95 (!) 140/57 96/68  Pulse:   91 84  Resp:   18 16  Temp: 99.3 F (37.4 C)  98.1 F (36.7 C) 97.7 F (36.5 C)  TempSrc:   Oral   SpO2:   99%   Weight:        Exam: Gen alert, no distress No jvd or bruits Chest clear bilat to bases  RRR no MRG Abd soft ntnd no mass or ascites +bs GU normal male MS R hand is wrapped Ext no leg edema, bilat BKA Neuro is alert, Ox 3 , nf LUE AVF+ bruit    Home meds:  - lipitor 20 /coreg 12.5 bid  - renvela 2.4gm tid ac/ pepcid 20 bid/ oxycodone prn  - insulin lantus 25u bid/ tradjenta 5 qd  - lyrica 25mg  bid/ neurontin 300 tid    OP HD: G-O MWF  4h 41min  425/800  94.5kg   2/2.5 bath  AVF LUE   Hep 4600 - Calcitriol 74mcg PO q HD - Mircera 217mcg IV q 2 weeks  Assessment/ Plan: 1. R hand ischemia/ gangrene - sp long/ ring finger amputations 5/22. Per primary team / ortho. May be going home today 2. ESRD - HD MWF. Had HD Friday. Next HD Monday.  3. IDDM - on insulin 4. PAD sp bilat BKA 5. HTN/ vol - cont meds, no vol excess on exam, below dry wt, lower slightly at discharge.  6. 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   Inpatient medications: . atorvastatin  20 mg Oral QHS  . [START ON 04/26/2020] carvedilol  12.5 mg Oral Once per day on Mon Wed Fri  . carvedilol  12.5 mg Oral 2 times per day on Sun Tue Thu Sat  . Chlorhexidine Gluconate Cloth  6 each Topical Q0600   . famotidine  20 mg Oral Daily  . [START ON 04/26/2020] gabapentin  300 mg Oral Once per day on Mon Wed Fri  . heparin  5,000 Units Subcutaneous Q8H  . insulin aspart  0-6 Units Subcutaneous TID WC  . insulin glargine  25 Units Subcutaneous BID  . povidone-iodine  2 application Topical Once  . pregabalin  25 mg Oral BID  . sevelamer carbonate  2,400 mg Oral TID WC    acetaminophen **OR** acetaminophen, calcium carbonate (dosed in mg elemental calcium), camphor-menthol **AND** hydrOXYzine, docusate sodium, feeding supplement (NEPRO CARB STEADY), hydrALAZINE, ondansetron **OR** ondansetron (ZOFRAN) IV, oxyCODONE-acetaminophen, sorbitol, zolpidem

## 2020-04-25 NOTE — Evaluation (Signed)
Occupational Therapy Evaluation Patient Details Name: [name] MRN: [id number] DOB: 04/12/1961 Today's Date: 04/25/2020    History of Present Illness 59 y.o. male with medical history significant of PVD s/p B BKA; s/p R index finger/ray amp 03/2020; DM; HTN; and ESRD on MWF HD. Pt admitted for R hand sx, undergoing amputation of R long finger and ring finger 04/24/2020.   Clinical Impression   Pt PTA: Pt living at home with spouse. Pt currently, performing AROM of RUE with thumb and 5th digit left; pt has recent amputation of 2nd-3rd digits. Pt currently minguardA for mobility with RW. Pt performing ADL tasks with mostly LUE and education provided for edema control and ADL compensatory strategies with RUE healing. Pt does not require continued OT skilled services for ADL, mobility and safety. OT signing off.      Follow Up Recommendations  No OT follow up;Supervision - Intermittent    Equipment Recommendations  3 in 1 bedside commode    Recommendations for Other Services       Precautions / Restrictions Precautions Precautions: Fall Restrictions Other Position/Activity Restrictions: assume NWB in RUE at digits      Mobility Bed Mobility Overal bed mobility: Independent                Transfers Overall transfer level: Needs assistance Equipment used: None Transfers: Sit to/from Stand Sit to Stand: Min guard         General transfer comment: using RW for stability    Balance Overall balance assessment: Needs assistance Sitting-balance support: No upper extremity supported;Feet unsupported Sitting balance-Leahy Scale: Good     Standing balance support: No upper extremity supported;Single extremity supported;During functional activity Standing balance-Leahy Scale: Fair                             ADL either performed or assessed with clinical judgement   ADL Overall ADL's : At baseline                                        General ADL Comments: Pt showing ability to donn/doff prosthetic and simulating donning pants in standing with 1UE. Pt advised to buy elastic waisted pants/shorts; have belt already on jeans/slacks and ask for assist for buttons/zippers/ties. Pt aware of button hook. Pt R handed and has been using LUE mostly.     Vision Baseline Vision/History: No visual deficits Patient Visual Report: No change from baseline Vision Assessment?: No apparent visual deficits     Perception     Praxis      Pertinent Vitals/Pain Pain Assessment: No/denies pain     Hand Dominance Right   Extremity/Trunk Assessment Upper Extremity Assessment Upper Extremity Assessment: RUE deficits/detail RUE Deficits / Details: Pt with thumb and 5th digit left; pt has recent amputation of 2-3rd digits. RUE Coordination: decreased fine motor   Lower Extremity Assessment Lower Extremity Assessment: Defer to PT evaluation RLE Deficits / Details: h/o BKA [name] Deficits / Details: h/o BKA   Cervical / Trunk Assessment Cervical / Trunk Assessment: Normal   Communication Communication Communication: No difficulties   Cognition Arousal/Alertness: Awake/alert Behavior During Therapy: WFL for tasks assessed/performed Overall Cognitive Status: Within Functional Limits for tasks assessed  General Comments  VSS. Pt able to wiggle R digits and encouraged to move RUE.    Exercises     Shoulder Instructions      Home Living Family/patient expects to be discharged to:: Private residence Living Arrangements: Spouse/significant other Available Help at Discharge: Family;Available PRN/intermittently Type of Home: House Home Access: Level entry     Home Layout: One level     Bathroom Shower/Tub: Teacher, early years/pre: Standard Bathroom Accessibility: Yes   Home Equipment: Environmental consultant - 2 wheels;Bedside commode;Cane - quad;Crutches;Wheelchair - manual;Tub  bench          Prior Functioning/Environment Level of Independence: Independent                 OT Problem List: Decreased activity tolerance;Impaired UE functional use      OT Treatment/Interventions:      OT Goals(Current goals can be found in the care plan section) Acute Rehab OT Goals Patient Stated Goal: home OT Goal Formulation: With patient  OT Frequency:     Barriers to D/C:            Co-evaluation              AM-PAC OT "6 Clicks" Daily Activity     Outcome Measure Help from another person eating meals?: None Help from another person taking care of personal grooming?: None Help from another person toileting, which includes using toliet, bedpan, or urinal?: A Little Help from another person bathing (including washing, rinsing, drying)?: A Little Help from another person to put on and taking off regular upper body clothing?: A Little Help from another person to put on and taking off regular lower body clothing?: A Little 6 Click Score: 20   End of Session Equipment Utilized During Treatment: Rolling walker Nurse Communication: Mobility status  Activity Tolerance: Patient tolerated treatment well Patient left: in bed;with call bell/phone within reach;with bed alarm set  OT Visit Diagnosis: Unsteadiness on feet (R26.81);History of falling (Z91.81)                Time: 1140-1210 OT Time Calculation (min): 30 min Charges:  OT General Charges $OT Visit: 1 Visit OT Evaluation $OT Eval Moderate Complexity: 1 Mod OT Treatments $Self Care/Home Management : 8-22 mins  Jefferey Pica, OTR/L Acute Rehabilitation Services Pager: [phone] Office: [phone]   Jaxsin Bottomley C 04/25/2020, 3:06 PM

## 2020-04-25 NOTE — Evaluation (Signed)
Physical Therapy Evaluation Patient Details Name: JAEDIN REGINA MRN: [id number] DOB: 06/29/1961 Today's Date: 04/25/2020   History of Present Illness  59 y.o. male with medical history significant of PVD s/p B BKA; s/p R index finger/ray amp 03/2020; DM; HTN; and ESRD on MWF HD. Pt admitted for R hand sx, undergoing amputation of R long finger and ring finger 04/24/2020.    Clinical Impression  PT eval complete. Pt demonstrated independence with bed mobility. Sat EOB to don BLE prosthesis with set-up help only. Min guard assist transfers and HH/min guard assist ambulation 150' without AD. Pt reports he is at his baseline for mobility. Mildly unsteady due to not having ambulated for 3 days. Pt has home DME if needed, including wheelchair and R platform RW. Pt to d/c home today. PT signing off.    Follow Up Recommendations No PT follow up;Supervision for mobility/OOB    Equipment Recommendations  None recommended by PT    Recommendations for Other Services       Precautions / Restrictions Precautions Precautions: Fall      Mobility  Bed Mobility Overal bed mobility: Independent                Transfers Overall transfer level: Needs assistance Equipment used: None Transfers: Sit to/from Stand Sit to Stand: Min guard         General transfer comment: min guard fo safety, increased time to fully rise and stabilize balance-  Ambulation/Gait Ambulation/Gait assistance: Min guard Gait Distance (Feet): 150 Feet Assistive device: 1 person hand held assist Gait Pattern/deviations: Step-through pattern;Decreased stride length;Wide base of support Gait velocity: WFL Gait velocity interpretation: 1.31 - 2.62 ft/sec, indicative of limited community ambulator General Gait Details: HH/min guard assist. No LOB noted. Pt furniture walks at baseline.  Stairs            Wheelchair Mobility    Modified Rankin (Stroke Patients Only)       Balance Overall balance  assessment: Needs assistance Sitting-balance support: No upper extremity supported;Feet unsupported Sitting balance-Leahy Scale: Good     Standing balance support: No upper extremity supported;Single extremity supported;During functional activity Standing balance-Leahy Scale: Fair                               Pertinent Vitals/Pain Pain Assessment: Faces Faces Pain Scale: Hurts little more Pain Location: R hand Pain Descriptors / Indicators: Discomfort Pain Intervention(s): Premedicated before session;Monitored during session    [place] expects to be discharged to:: Private residence Living Arrangements: Spouse/significant other Available Help at Discharge: Family;Available PRN/intermittently Type of Home: House Home Access: Level entry     Home Layout: One level Home Equipment: Walker - 2 wheels;Bedside commode;Cane - quad;Crutches;Wheelchair - manual;Tub bench;Other (comment)(R platform RW)      Prior Function Level of Independence: Independent         Comments: Ambulating without AD with BLE prosthesis. Furniture walks in house.     Hand Dominance   Dominant Hand: Right    Extremity/Trunk Assessment   Upper Extremity Assessment Upper Extremity Assessment: Defer to OT evaluation    Lower Extremity Assessment Lower Extremity Assessment: RLE deficits/detail;[name] deficits/detail RLE Deficits / Details: h/o BKA [name] Deficits / Details: h/o BKA    Cervical / Trunk Assessment Cervical / Trunk Assessment: Normal  Communication   Communication: No difficulties  Cognition Arousal/Alertness: Awake/alert Behavior During Therapy: WFL for tasks assessed/performed Overall Cognitive Status: Within Functional  Limits for tasks assessed                                 General Comments: mildly impulsive      General Comments General comments (skin integrity, edema, etc.): VSS    Exercises     Assessment/Plan    PT  Assessment Patent does not need any further PT services  PT Problem List         PT Treatment Interventions      PT Goals (Current goals can be found in the Care Plan section)  Acute Rehab PT Goals Patient Stated Goal: home PT Goal Formulation: All assessment and education complete, DC therapy    Frequency     Barriers to discharge        Co-evaluation               AM-PAC PT "6 Clicks" Mobility  Outcome Measure Help needed turning from your back to your side while in a flat bed without using bedrails?: None Help needed moving from lying on your back to sitting on the side of a flat bed without using bedrails?: None Help needed moving to and from a bed to a chair (including a wheelchair)?: None Help needed standing up from a chair using your arms (e.g., wheelchair or bedside chair)?: None Help needed to walk in hospital room?: A Little Help needed climbing 3-5 steps with a railing? : A Little 6 Click Score: 22    End of Session Equipment Utilized During Treatment: Gait belt Activity Tolerance: Patient tolerated treatment well Patient left: in bed;with call bell/phone within reach;with bed alarm set Nurse Communication: Mobility status PT Visit Diagnosis: Unsteadiness on feet (R26.81)    Time: 8144-8185 PT Time Calculation (min) (ACUTE ONLY): 18 min   Charges:   PT Evaluation $PT Eval Moderate Complexity: 1 Mod          Lorrin Goodell, PT  Office # [phone] Pager [phone]   Lorriane Shire 04/25/2020, 10:28 AM
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
## 2020-04-27 ENCOUNTER — Telehealth: Payer: Self-pay | Admitting: Nephrology

## 2020-04-27 LAB — SURGICAL PATHOLOGY

## 2020-04-27 NOTE — Telephone Encounter (Signed)
       Show:Clear all [x] Manual[] Template[x] Copied  Added by: [x] Ernest Haber, PA-C  [] Hover for details Transition of care contact from inpatient facility  Date of discharge:04/25/20 Date of contact:04/27/20 Method: Phone Spoke to: Patient    Patient contacted to discuss transition of care from recent inpatient hospitalization. Patient was admitted to [facility] from[facility]/21/21 to 5/23/21with discharge diagnosis ofR hand ischemia/ gangrene - sp long/ ring finger amputations 5/22  Medication changes were reviewed pt asking about using Ibuprofen for post finger pain , as he missed HD yest went to see Dr Burney Gauze post dc visit  , I  Told him  use with food and no longer than 7 days    Patient will follow up with his/her outpatient HD unit on: 5/26 and provider to see 04/28/20 at center

## 2020-04-29 LAB — CULTURE, BLOOD (ROUTINE X 2)
Culture: NO GROWTH
Culture: NO GROWTH
Special Requests: ADEQUATE
Special Requests: ADEQUATE

## 2020-05-26 ENCOUNTER — Inpatient Hospital Stay (HOSPITAL_COMMUNITY): Payer: Medicare Other | Admitting: Certified Registered"

## 2020-05-26 ENCOUNTER — Other Ambulatory Visit: Payer: Self-pay | Admitting: Orthopedic Surgery

## 2020-05-26 ENCOUNTER — Inpatient Hospital Stay (HOSPITAL_COMMUNITY)
Admission: RE | Admit: 2020-05-26 | Discharge: 2020-06-01 | DRG: 299 | Disposition: A | Discharge status: Home Health | Payer: Medicare Other | Attending: Internal Medicine | Admitting: Internal Medicine

## 2020-05-26 ENCOUNTER — Other Ambulatory Visit: Payer: Self-pay

## 2020-05-26 ENCOUNTER — Encounter (HOSPITAL_COMMUNITY): Payer: Self-pay | Admitting: Internal Medicine

## 2020-05-26 ENCOUNTER — Encounter (HOSPITAL_COMMUNITY)
Admission: RE | Disposition: A | Discharge status: Home Health | Payer: Self-pay | Source: Home / Self Care | Attending: Internal Medicine

## 2020-05-26 DIAGNOSIS — Z992 Dependence on renal dialysis: Secondary | ICD-10-CM | POA: Diagnosis not present

## 2020-05-26 DIAGNOSIS — Z79899 Other long term (current) drug therapy: Secondary | ICD-10-CM

## 2020-05-26 DIAGNOSIS — E8889 Other specified metabolic disorders: Secondary | ICD-10-CM | POA: Diagnosis present

## 2020-05-26 DIAGNOSIS — M869 Osteomyelitis, unspecified: Secondary | ICD-10-CM | POA: Diagnosis present

## 2020-05-26 DIAGNOSIS — Z89021 Acquired absence of right finger(s): Secondary | ICD-10-CM | POA: Diagnosis not present

## 2020-05-26 DIAGNOSIS — Z20822 Contact with and (suspected) exposure to covid-19: Secondary | ICD-10-CM | POA: Diagnosis present

## 2020-05-26 DIAGNOSIS — E785 Hyperlipidemia, unspecified: Secondary | ICD-10-CM | POA: Diagnosis present

## 2020-05-26 DIAGNOSIS — Z833 Family history of diabetes mellitus: Secondary | ICD-10-CM | POA: Diagnosis not present

## 2020-05-26 DIAGNOSIS — E1140 Type 2 diabetes mellitus with diabetic neuropathy, unspecified: Secondary | ICD-10-CM | POA: Diagnosis present

## 2020-05-26 DIAGNOSIS — Z89511 Acquired absence of right leg below knee: Secondary | ICD-10-CM | POA: Diagnosis not present

## 2020-05-26 DIAGNOSIS — Z89512 Acquired absence of left leg below knee: Secondary | ICD-10-CM | POA: Diagnosis not present

## 2020-05-26 DIAGNOSIS — I120 Hypertensive chronic kidney disease with stage 5 chronic kidney disease or end stage renal disease: Secondary | ICD-10-CM | POA: Diagnosis present

## 2020-05-26 DIAGNOSIS — D631 Anemia in chronic kidney disease: Secondary | ICD-10-CM | POA: Diagnosis present

## 2020-05-26 DIAGNOSIS — K219 Gastro-esophageal reflux disease without esophagitis: Secondary | ICD-10-CM | POA: Diagnosis present

## 2020-05-26 DIAGNOSIS — E1122 Type 2 diabetes mellitus with diabetic chronic kidney disease: Secondary | ICD-10-CM | POA: Diagnosis present

## 2020-05-26 DIAGNOSIS — I739 Peripheral vascular disease, unspecified: Secondary | ICD-10-CM | POA: Diagnosis present

## 2020-05-26 DIAGNOSIS — Z8249 Family history of ischemic heart disease and other diseases of the circulatory system: Secondary | ICD-10-CM

## 2020-05-26 DIAGNOSIS — G8918 Other acute postprocedural pain: Secondary | ICD-10-CM | POA: Diagnosis not present

## 2020-05-26 DIAGNOSIS — N186 End stage renal disease: Secondary | ICD-10-CM | POA: Diagnosis present

## 2020-05-26 DIAGNOSIS — E1169 Type 2 diabetes mellitus with other specified complication: Secondary | ICD-10-CM | POA: Diagnosis present

## 2020-05-26 DIAGNOSIS — B961 Klebsiella pneumoniae [K. pneumoniae] as the cause of diseases classified elsewhere: Secondary | ICD-10-CM | POA: Diagnosis present

## 2020-05-26 DIAGNOSIS — I96 Gangrene, not elsewhere classified: Secondary | ICD-10-CM | POA: Insufficient documentation

## 2020-05-26 DIAGNOSIS — E119 Type 2 diabetes mellitus without complications: Secondary | ICD-10-CM

## 2020-05-26 DIAGNOSIS — D509 Iron deficiency anemia, unspecified: Secondary | ICD-10-CM | POA: Diagnosis present

## 2020-05-26 DIAGNOSIS — L089 Local infection of the skin and subcutaneous tissue, unspecified: Secondary | ICD-10-CM | POA: Diagnosis not present

## 2020-05-26 DIAGNOSIS — Z87891 Personal history of nicotine dependence: Secondary | ICD-10-CM

## 2020-05-26 DIAGNOSIS — I70203 Unspecified atherosclerosis of native arteries of extremities, bilateral legs: Secondary | ICD-10-CM | POA: Diagnosis present

## 2020-05-26 DIAGNOSIS — E1152 Type 2 diabetes mellitus with diabetic peripheral angiopathy with gangrene: Secondary | ICD-10-CM | POA: Diagnosis present

## 2020-05-26 DIAGNOSIS — B966 Bacteroides fragilis [B. fragilis] as the cause of diseases classified elsewhere: Secondary | ICD-10-CM | POA: Diagnosis present

## 2020-05-26 DIAGNOSIS — Z792 Long term (current) use of antibiotics: Secondary | ICD-10-CM

## 2020-05-26 DIAGNOSIS — N2581 Secondary hyperparathyroidism of renal origin: Secondary | ICD-10-CM | POA: Diagnosis present

## 2020-05-26 DIAGNOSIS — M792 Neuralgia and neuritis, unspecified: Secondary | ICD-10-CM | POA: Diagnosis present

## 2020-05-26 DIAGNOSIS — I10 Essential (primary) hypertension: Secondary | ICD-10-CM | POA: Diagnosis present

## 2020-05-26 DIAGNOSIS — B9689 Other specified bacterial agents as the cause of diseases classified elsewhere: Secondary | ICD-10-CM | POA: Diagnosis present

## 2020-05-26 DIAGNOSIS — Z794 Long term (current) use of insulin: Secondary | ICD-10-CM | POA: Diagnosis not present

## 2020-05-26 HISTORY — PX: I & D EXTREMITY: SHX5045

## 2020-05-26 LAB — COMPREHENSIVE METABOLIC PANEL
ALT: 17 U/L (ref 0–44)
AST: 15 U/L (ref 15–41)
Albumin: 3 g/dL — ABNORMAL LOW (ref 3.5–5.0)
Alkaline Phosphatase: 70 U/L (ref 38–126)
Anion gap: 17 — ABNORMAL HIGH (ref 5–15)
BUN: 48 mg/dL — ABNORMAL HIGH (ref 8–23)
CO2: 26 mmol/L (ref 22–32)
Calcium: 9.2 mg/dL (ref 8.9–10.3)
Chloride: 93 mmol/L — ABNORMAL LOW (ref 98–111)
Creatinine, Ser: 10.15 mg/dL — ABNORMAL HIGH (ref 0.61–1.24)
GFR calc Af Amer: 6 mL/min — ABNORMAL LOW (ref >60)
GFR calc non Af Amer: 5 mL/min — ABNORMAL LOW (ref >60)
Glucose, Bld: 110 mg/dL — ABNORMAL HIGH (ref 70–99)
Potassium: 4.1 mmol/L (ref 3.5–5.1)
Sodium: 136 mmol/L (ref 135–145)
Total Bilirubin: 1 mg/dL (ref 0.3–1.2)
Total Protein: 8 g/dL (ref 6.5–8.1)

## 2020-05-26 LAB — IRON AND TIBC
Iron: 12 ug/dL — ABNORMAL LOW (ref 45–182)
Saturation Ratios: 6 % — ABNORMAL LOW (ref 17.9–39.5)
TIBC: 188 ug/dL — ABNORMAL LOW (ref 250–450)
UIBC: 176 ug/dL

## 2020-05-26 LAB — CBC WITH DIFFERENTIAL/PLATELET
Abs Immature Granulocytes: 0.04 10*3/uL (ref 0.00–0.07)
Basophils Absolute: 0.1 10*3/uL (ref 0.0–0.1)
Basophils Relative: 0 %
Eosinophils Absolute: 0.1 10*3/uL (ref 0.0–0.5)
Eosinophils Relative: 1 %
HCT: 31.5 % — ABNORMAL LOW (ref 39.0–52.0)
Hemoglobin: 9.3 g/dL — ABNORMAL LOW (ref 13.0–17.0)
Immature Granulocytes: 0 %
Lymphocytes Relative: 15 %
Lymphs Abs: 1.7 10*3/uL (ref 0.7–4.0)
MCH: 23.3 pg — ABNORMAL LOW (ref 26.0–34.0)
MCHC: 29.5 g/dL — ABNORMAL LOW (ref 30.0–36.0)
MCV: 78.8 fL — ABNORMAL LOW (ref 80.0–100.0)
Monocytes Absolute: 1.3 10*3/uL — ABNORMAL HIGH (ref 0.1–1.0)
Monocytes Relative: 11 %
Neutro Abs: 8 10*3/uL — ABNORMAL HIGH (ref 1.7–7.7)
Neutrophils Relative %: 73 %
Platelets: 163 10*3/uL (ref 150–400)
RBC: 4 MIL/uL — ABNORMAL LOW (ref 4.22–5.81)
RDW: 19.3 % — ABNORMAL HIGH (ref 11.5–15.5)
WBC: 11.2 10*3/uL — ABNORMAL HIGH (ref 4.0–10.5)
nRBC: 0 % (ref 0.0–0.2)

## 2020-05-26 LAB — HEMOGLOBIN A1C
Hgb A1c MFr Bld: 5.3 % (ref 4.8–5.6)
Mean Plasma Glucose: 105.41 mg/dL

## 2020-05-26 LAB — GLUCOSE, CAPILLARY
Glucose-Capillary: 108 mg/dL — ABNORMAL HIGH (ref 70–99)
Glucose-Capillary: 84 mg/dL (ref 70–99)
Glucose-Capillary: 98 mg/dL (ref 70–99)
Glucose-Capillary: 94 mg/dL (ref 70–99)

## 2020-05-26 LAB — MAGNESIUM: Magnesium: 2.3 mg/dL (ref 1.7–2.4)

## 2020-05-26 LAB — FERRITIN: Ferritin: 742 ng/mL — ABNORMAL HIGH (ref 24–336)

## 2020-05-26 LAB — SARS CORONAVIRUS 2 BY RT PCR (HOSPITAL ORDER, PERFORMED IN ~~LOC~~ HOSPITAL LAB): SARS Coronavirus 2: NEGATIVE

## 2020-05-26 SURGERY — IRRIGATION AND DEBRIDEMENT EXTREMITY
Anesthesia: General | Site: Hand | Laterality: Right

## 2020-05-26 MED ORDER — CARVEDILOL 12.5 MG PO TABS
12.5000 mg | ORAL_TABLET | Freq: Two times a day (BID) | ORAL | Status: DC
  Administered 2020-05-26 – 2020-05-31 (×8): 12.5 mg via ORAL
  Filled 2020-05-26 (×10): qty 1

## 2020-05-26 MED ORDER — PREGABALIN 100 MG PO CAPS
100.0000 mg | ORAL_CAPSULE | Freq: Every day | ORAL | Status: DC
  Administered 2020-05-26 – 2020-06-01 (×6): 100 mg via ORAL
  Filled 2020-05-26 (×6): qty 1

## 2020-05-26 MED ORDER — HYDROMORPHONE HCL 1 MG/ML IJ SOLN
0.2500 mg | INTRAMUSCULAR | Status: DC | PRN
  Administered 2020-05-26 (×2): 0.5 mg via INTRAVENOUS

## 2020-05-26 MED ORDER — SEVELAMER CARBONATE 800 MG PO TABS
2400.0000 mg | ORAL_TABLET | Freq: Three times a day (TID) | ORAL | Status: DC
  Administered 2020-05-27 – 2020-06-01 (×13): 2400 mg via ORAL
  Filled 2020-05-26 (×15): qty 3

## 2020-05-26 MED ORDER — FENTANYL CITRATE (PF) 100 MCG/2ML IJ SOLN
25.0000 ug | INTRAMUSCULAR | Status: DC | PRN
  Administered 2020-05-26: 50 ug via INTRAVENOUS

## 2020-05-26 MED ORDER — ACETAMINOPHEN 650 MG RE SUPP: 650.0000 mg | Freq: Four times a day (QID) | RECTAL | Status: DC | PRN

## 2020-05-26 MED ORDER — PIPERACILLIN-TAZOBACTAM 3.375 G IVPB 30 MIN
3.3750 g | Freq: Once | INTRAVENOUS | Status: AC
  Administered 2020-05-26: 3.375 g via INTRAVENOUS
  Filled 2020-05-26 (×2): qty 50

## 2020-05-26 MED ORDER — ROCURONIUM BROMIDE 10 MG/ML (PF) SYRINGE
PREFILLED_SYRINGE | INTRAVENOUS | Status: AC
  Filled 2020-05-26: qty 10

## 2020-05-26 MED ORDER — LIDOCAINE 2% (20 MG/ML) 5 ML SYRINGE
INTRAMUSCULAR | Status: DC | PRN
  Administered 2020-05-26: 80 mg via INTRAVENOUS

## 2020-05-26 MED ORDER — CARVEDILOL 12.5 MG PO TABS
ORAL_TABLET | ORAL | Status: AC
  Administered 2020-05-26: 12.5 mg via ORAL
  Filled 2020-05-26: qty 1

## 2020-05-26 MED ORDER — HYDROMORPHONE HCL 1 MG/ML IJ SOLN
INTRAMUSCULAR | Status: AC
  Administered 2020-05-26: 0.5 mg via INTRAVENOUS
  Filled 2020-05-26: qty 2

## 2020-05-26 MED ORDER — ONDANSETRON HCL 4 MG/2ML IJ SOLN
INTRAMUSCULAR | Status: AC
  Filled 2020-05-26: qty 2

## 2020-05-26 MED ORDER — MIDAZOLAM HCL 2 MG/2ML IJ SOLN
INTRAMUSCULAR | Status: AC
  Filled 2020-05-26: qty 2

## 2020-05-26 MED ORDER — EPHEDRINE SULFATE-NACL 50-0.9 MG/10ML-% IV SOSY
PREFILLED_SYRINGE | INTRAVENOUS | Status: DC | PRN
  Administered 2020-05-26 (×2): 10 mg via INTRAVENOUS

## 2020-05-26 MED ORDER — OXYCODONE HCL 5 MG PO TABS
ORAL_TABLET | ORAL | Status: AC
  Administered 2020-05-26: 5 mg via ORAL
  Filled 2020-05-26: qty 1

## 2020-05-26 MED ORDER — VANCOMYCIN HCL IN DEXTROSE 1-5 GM/200ML-% IV SOLN
1000.0000 mg | INTRAVENOUS | Status: AC
  Administered 2020-05-27: 1000 mg via INTRAVENOUS
  Filled 2020-05-26: qty 200

## 2020-05-26 MED ORDER — GLYCOPYRROLATE PF 0.2 MG/ML IJ SOSY
PREFILLED_SYRINGE | INTRAMUSCULAR | Status: AC
  Filled 2020-05-26: qty 3

## 2020-05-26 MED ORDER — KETOROLAC TROMETHAMINE 30 MG/ML IJ SOLN
INTRAMUSCULAR | Status: AC
  Filled 2020-05-26: qty 1

## 2020-05-26 MED ORDER — CEFAZOLIN SODIUM-DEXTROSE 2-4 GM/100ML-% IV SOLN
2.0000 g | INTRAVENOUS | Status: AC
  Administered 2020-05-26: 2 g via INTRAVENOUS
  Filled 2020-05-26: qty 100

## 2020-05-26 MED ORDER — EPHEDRINE 5 MG/ML INJ
INTRAVENOUS | Status: AC
  Filled 2020-05-26: qty 10

## 2020-05-26 MED ORDER — OXYCODONE HCL 5 MG PO TABS
5.0000 mg | ORAL_TABLET | ORAL | Status: DC | PRN
  Administered 2020-05-26 – 2020-05-27 (×2): 5 mg via ORAL
  Filled 2020-05-26 (×2): qty 1

## 2020-05-26 MED ORDER — GLYCOPYRROLATE 0.2 MG/ML IJ SOLN
INTRAMUSCULAR | Status: DC | PRN
Start: 2020-05-26 — End: 2020-05-26
  Administered 2020-05-26 (×2): .1 mg via INTRAVENOUS

## 2020-05-26 MED ORDER — INSULIN ASPART 100 UNIT/ML ~~LOC~~ SOLN: 0.0000 [IU] | Freq: Every day | SUBCUTANEOUS | Status: DC

## 2020-05-26 MED ORDER — 0.9 % SODIUM CHLORIDE (POUR BTL) OPTIME
TOPICAL | Status: DC | PRN
  Administered 2020-05-26: 1000 mL

## 2020-05-26 MED ORDER — CHLORHEXIDINE GLUCONATE 0.12 % MT SOLN
OROMUCOSAL | Status: AC
  Administered 2020-05-26: 15 mL
  Filled 2020-05-26: qty 15

## 2020-05-26 MED ORDER — ONDANSETRON HCL 4 MG/2ML IJ SOLN: 4.0000 mg | Freq: Once | INTRAMUSCULAR | Status: DC | PRN

## 2020-05-26 MED ORDER — ONDANSETRON HCL 4 MG/2ML IJ SOLN: 4.0000 mg | Freq: Four times a day (QID) | INTRAMUSCULAR | Status: DC | PRN

## 2020-05-26 MED ORDER — ONDANSETRON HCL 4 MG/2ML IJ SOLN
INTRAMUSCULAR | Status: DC | PRN
  Administered 2020-05-26: 4 mg via INTRAVENOUS

## 2020-05-26 MED ORDER — SODIUM CHLORIDE 0.9 % IV SOLN: INTRAVENOUS | Status: DC

## 2020-05-26 MED ORDER — CARVEDILOL 12.5 MG PO TABS
12.5000 mg | ORAL_TABLET | Freq: Once | ORAL | Status: AC
  Filled 2020-05-26: qty 1

## 2020-05-26 MED ORDER — POVIDONE-IODINE 10 % EX SWAB
2.0000 "application " | Freq: Once | CUTANEOUS | Status: AC
  Administered 2020-05-26: 2 via TOPICAL

## 2020-05-26 MED ORDER — LIDOCAINE 2% (20 MG/ML) 5 ML SYRINGE
INTRAMUSCULAR | Status: AC
  Filled 2020-05-26: qty 10

## 2020-05-26 MED ORDER — VANCOMYCIN HCL 2000 MG/400ML IV SOLN
2000.0000 mg | Freq: Once | INTRAVENOUS | Status: DC
  Filled 2020-05-26 (×2): qty 400

## 2020-05-26 MED ORDER — HYDROMORPHONE HCL 1 MG/ML IJ SOLN: 0.5000 mg | INTRAMUSCULAR | Status: DC | PRN

## 2020-05-26 MED ORDER — LINAGLIPTIN 5 MG PO TABS
5.0000 mg | ORAL_TABLET | Freq: Every day | ORAL | Status: DC
  Administered 2020-05-27 – 2020-06-01 (×6): 5 mg via ORAL
  Filled 2020-05-26 (×6): qty 1

## 2020-05-26 MED ORDER — PHENYLEPHRINE 40 MCG/ML (10ML) SYRINGE FOR IV PUSH (FOR BLOOD PRESSURE SUPPORT)
PREFILLED_SYRINGE | INTRAVENOUS | Status: AC
  Filled 2020-05-26: qty 20

## 2020-05-26 MED ORDER — ORAL CARE MOUTH RINSE: 15.0000 mL | Freq: Once | OROMUCOSAL | Status: AC

## 2020-05-26 MED ORDER — SUCCINYLCHOLINE CHLORIDE 200 MG/10ML IV SOSY
PREFILLED_SYRINGE | INTRAVENOUS | Status: AC
  Filled 2020-05-26: qty 10

## 2020-05-26 MED ORDER — FENTANYL CITRATE (PF) 250 MCG/5ML IJ SOLN
INTRAMUSCULAR | Status: AC
  Filled 2020-05-26: qty 5

## 2020-05-26 MED ORDER — CHLORHEXIDINE GLUCONATE CLOTH 2 % EX PADS
6.0000 | MEDICATED_PAD | Freq: Every day | CUTANEOUS | Status: DC
  Administered 2020-05-28 – 2020-06-01 (×4): 6 via TOPICAL

## 2020-05-26 MED ORDER — DEXAMETHASONE SODIUM PHOSPHATE 10 MG/ML IJ SOLN
INTRAMUSCULAR | Status: AC
  Filled 2020-05-26: qty 1

## 2020-05-26 MED ORDER — CHLORHEXIDINE GLUCONATE 0.12 % MT SOLN: 15.0000 mL | Freq: Once | OROMUCOSAL | Status: AC

## 2020-05-26 MED ORDER — CALCIUM CARBONATE ANTACID 1250 MG/5ML PO SUSP
500.0000 mg | Freq: Four times a day (QID) | ORAL | Status: DC | PRN
  Filled 2020-05-26: qty 5

## 2020-05-26 MED ORDER — FENTANYL CITRATE (PF) 100 MCG/2ML IJ SOLN
INTRAMUSCULAR | Status: DC | PRN
  Administered 2020-05-26 (×2): 50 ug via INTRAVENOUS

## 2020-05-26 MED ORDER — HYDROMORPHONE HCL 1 MG/ML IJ SOLN
0.5000 mg | INTRAMUSCULAR | Status: DC | PRN
  Administered 2020-05-26 (×2): 0.5 mg via INTRAVENOUS
  Administered 2020-05-26 – 2020-05-28 (×6): 1 mg via INTRAVENOUS
  Filled 2020-05-26 (×5): qty 1

## 2020-05-26 MED ORDER — PROPOFOL 10 MG/ML IV BOLUS
INTRAVENOUS | Status: DC | PRN
  Administered 2020-05-26: 100 mg via INTRAVENOUS

## 2020-05-26 MED ORDER — INSULIN ASPART 100 UNIT/ML ~~LOC~~ SOLN: 0.0000 [IU] | Freq: Three times a day (TID) | SUBCUTANEOUS | Status: DC

## 2020-05-26 MED ORDER — SODIUM CHLORIDE 0.9 % IR SOLN
Status: DC | PRN
  Administered 2020-05-26: 3000 mL

## 2020-05-26 MED ORDER — PIPERACILLIN-TAZOBACTAM IN DEX 2-0.25 GM/50ML IV SOLN
2.2500 g | Freq: Three times a day (TID) | INTRAVENOUS | Status: DC
  Administered 2020-05-27 – 2020-05-30 (×9): 2.25 g via INTRAVENOUS
  Filled 2020-05-26 (×12): qty 50

## 2020-05-26 MED ORDER — ONDANSETRON HCL 4 MG PO TABS: 4.0000 mg | ORAL_TABLET | Freq: Four times a day (QID) | ORAL | Status: DC | PRN

## 2020-05-26 MED ORDER — PHENYLEPHRINE 40 MCG/ML (10ML) SYRINGE FOR IV PUSH (FOR BLOOD PRESSURE SUPPORT)
PREFILLED_SYRINGE | INTRAVENOUS | Status: DC | PRN
  Administered 2020-05-26 (×3): 80 ug via INTRAVENOUS

## 2020-05-26 MED ORDER — FENTANYL CITRATE (PF) 100 MCG/2ML IJ SOLN
INTRAMUSCULAR | Status: AC
  Administered 2020-05-26: 50 ug via INTRAVENOUS
  Filled 2020-05-26: qty 2

## 2020-05-26 MED ORDER — VANCOMYCIN HCL IN DEXTROSE 1-5 GM/200ML-% IV SOLN
1000.0000 mg | INTRAVENOUS | Status: DC
  Filled 2020-05-26: qty 200

## 2020-05-26 MED ORDER — STERILE WATER FOR IRRIGATION IR SOLN
Status: DC | PRN
  Administered 2020-05-26: 1000 mL

## 2020-05-26 MED ORDER — FAMOTIDINE 20 MG PO TABS: 20.0000 mg | ORAL_TABLET | Freq: Every day | ORAL | Status: DC | PRN

## 2020-05-26 MED ORDER — PROPOFOL 10 MG/ML IV BOLUS
INTRAVENOUS | Status: AC
  Filled 2020-05-26: qty 20

## 2020-05-26 MED ORDER — ACETAMINOPHEN 325 MG PO TABS
650.0000 mg | ORAL_TABLET | Freq: Four times a day (QID) | ORAL | Status: DC | PRN
  Administered 2020-05-26 – 2020-05-27 (×2): 650 mg via ORAL
  Filled 2020-05-26 (×2): qty 2

## 2020-05-26 MED ORDER — HYDROMORPHONE HCL 1 MG/ML IJ SOLN
INTRAMUSCULAR | Status: AC
  Administered 2020-05-26: 0.5 mg via INTRAVENOUS
  Filled 2020-05-26: qty 1

## 2020-05-26 MED ORDER — ATORVASTATIN CALCIUM 10 MG PO TABS
20.0000 mg | ORAL_TABLET | Freq: Every day | ORAL | Status: DC
  Administered 2020-05-26 – 2020-05-30 (×5): 20 mg via ORAL
  Filled 2020-05-26 (×5): qty 2

## 2020-05-26 SURGICAL SUPPLY — 41 items
BNDG CMPR 9X4 STRL LF SNTH (GAUZE/BANDAGES/DRESSINGS) ×1
BNDG ELASTIC 3X5.8 VLCR STR LF (GAUZE/BANDAGES/DRESSINGS) ×3 IMPLANT
BNDG ELASTIC 4X5.8 VLCR STR LF (GAUZE/BANDAGES/DRESSINGS) ×3 IMPLANT
BNDG ESMARK 4X9 LF (GAUZE/BANDAGES/DRESSINGS) ×2 IMPLANT
BNDG GAUZE ELAST 4 BULKY (GAUZE/BANDAGES/DRESSINGS) ×4 IMPLANT
CORD BIPOLAR FORCEPS 12FT (ELECTRODE) ×2 IMPLANT
CUFF TOURN SGL QUICK 18X4 (TOURNIQUET CUFF) ×3 IMPLANT
DRAIN PENROSE 1/4X12 LTX STRL (WOUND CARE) ×2 IMPLANT
DRAPE SURG 17X23 STRL (DRAPES) ×3 IMPLANT
DRSG XEROFORM 1X8 (GAUZE/BANDAGES/DRESSINGS) ×2 IMPLANT
GAUZE PACKING IODOFORM 1/4X15 (PACKING) IMPLANT
GAUZE SPONGE 4X4 12PLY STRL (GAUZE/BANDAGES/DRESSINGS) ×4 IMPLANT
GAUZE XEROFORM 1X8 LF (GAUZE/BANDAGES/DRESSINGS) ×1 IMPLANT
GLOVE SURG SYN 8.0 (GLOVE) ×3 IMPLANT
GLOVE SURG SYN 8.0 PF PI (GLOVE) ×1 IMPLANT
GOWN STRL REUS W/ TWL LRG LVL3 (GOWN DISPOSABLE) ×1 IMPLANT
GOWN STRL REUS W/ TWL XL LVL3 (GOWN DISPOSABLE) ×1 IMPLANT
GOWN STRL REUS W/TWL LRG LVL3 (GOWN DISPOSABLE) ×3
GOWN STRL REUS W/TWL XL LVL3 (GOWN DISPOSABLE) ×3
KIT BASIN OR (CUSTOM PROCEDURE TRAY) ×3 IMPLANT
KIT TURNOVER KIT B (KITS) ×3 IMPLANT
MANIFOLD NEPTUNE II (INSTRUMENTS) ×3 IMPLANT
NS IRRIG 1000ML POUR BTL (IV SOLUTION) ×3 IMPLANT
PACK ORTHO EXTREMITY (CUSTOM PROCEDURE TRAY) ×3 IMPLANT
PAD ARMBOARD 7.5X6 YLW CONV (MISCELLANEOUS) ×6 IMPLANT
PAD CAST 4YDX4 CTTN HI CHSV (CAST SUPPLIES) ×2 IMPLANT
PADDING CAST COTTON 4X4 STRL (CAST SUPPLIES) ×3
SET CYSTO W/LG BORE CLAMP LF (SET/KITS/TRAYS/PACK) ×2 IMPLANT
SOL PREP PROV IODINE SCRUB 4OZ (MISCELLANEOUS) ×2 IMPLANT
SOLUTION BETADINE 4OZ (MISCELLANEOUS) ×2 IMPLANT
SPONGE LAP 18X18 RF (DISPOSABLE) ×3 IMPLANT
SUT ETHILON 4 0 PS 2 18 (SUTURE) ×8 IMPLANT
SUT VICRYL RAPIDE 4/0 PS 2 (SUTURE) IMPLANT
SWAB COLLECTION DEVICE MRSA (MISCELLANEOUS) ×3 IMPLANT
SWAB CULTURE ESWAB REG 1ML (MISCELLANEOUS) ×2 IMPLANT
SYR CONTROL 10ML LL (SYRINGE) ×3 IMPLANT
TOWEL GREEN STERILE (TOWEL DISPOSABLE) ×3 IMPLANT
TUBE CONNECTING 12'X1/4 (SUCTIONS) ×1
TUBE CONNECTING 12X1/4 (SUCTIONS) ×2 IMPLANT
UNDERPAD 30X36 HEAVY ABSORB (UNDERPADS AND DIAPERS) ×3 IMPLANT
YANKAUER SUCT BULB TIP NO VENT (SUCTIONS) ×3 IMPLANT

## 2020-05-26 NOTE — Progress Notes (Signed)
Patient underwent incision and drainage with culturing of both soft tissue and bone from the long and ring metacarpal heads as well as extensive skin edge debridement and then loose wound closure over Penrose drain this evening.  Patient will need follow-up in my office next week on either Monday or Tuesday.  Would await final cultures from the operating room to determine appropriate antibiotic treatment.  May want to consider infectious disease consult for determination of length of antibiotic treatment for suspected osteomyelitis.  Will await bone cultures.
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
## 2020-05-26 NOTE — Anesthesia Preprocedure Evaluation (Signed)
Anesthesia Evaluation  Patient identified by MRN, date of birth, ID band Patient awake    Reviewed: Allergy & Precautions, NPO status , Patient's Chart, lab work & pertinent test results, reviewed documented beta blocker date and time   History of Anesthesia Complications Negative for: history of anesthetic complications  Airway Mallampati: II  TM Distance: >3 FB Neck ROM: Full    Dental  (+) Dental Advisory Given, Partial Upper   Pulmonary former smoker,    Pulmonary exam normal breath sounds clear to auscultation       Cardiovascular hypertension, Pt. on home beta blockers + Peripheral Vascular Disease  Normal cardiovascular exam Rhythm:Regular Rate:Normal     Neuro/Psych negative neurological ROS  negative psych ROS   GI/Hepatic negative GI ROS, (+) Hepatitis - (s/p treatment), C  Endo/Other  diabetes, Insulin Dependent  Renal/GU ESRF and DialysisRenal disease (MWF, missed today)     Musculoskeletal negative musculoskeletal ROS (+)   Abdominal   Peds  Hematology  (+) Blood dyscrasia, anemia ,   Anesthesia Other Findings Day of surgery medications reviewed with the patient.  Reproductive/Obstetrics                             Anesthesia Physical Anesthesia Plan  ASA: III  Anesthesia Plan: General   Post-op Pain Management:    Induction: Intravenous  PONV Risk Score and Plan: 2 and Dexamethasone and Ondansetron  Airway Management Planned: LMA  Additional Equipment:   Intra-op Plan:   Post-operative Plan: Extubation in OR  Informed Consent: I have reviewed the patients History and Physical, chart, labs and discussed the procedure including the risks, benefits and alternatives for the proposed anesthesia with the patient or authorized representative who has indicated his/her understanding and acceptance.     Dental advisory given  Plan Discussed with: CRNA  Anesthesia  Plan Comments:         Anesthesia Quick Evaluation

## 2020-05-26 NOTE — Brief Op Note (Signed)
05/26/2020  6:20 PM  PATIENT:  [name]  59 y.o. male  PRE-OPERATIVE DIAGNOSIS:  Right hand ischemic necrosis  POST-OPERATIVE DIAGNOSIS:  Right hand ischemic necrosis  PROCEDURE:  Procedure(s): IRRIGATION AND DEBRIDEMENT RIGHT HAND (Right)  SURGEON:  Surgeon(s) and Role:    Charlotte Crumb, MD - Primary  PHYSICIAN ASSISTANT:   ASSISTANTS: none   ANESTHESIA:   general  EBL:  20 mL   BLOOD ADMINISTERED:none  DRAINS: Penrose drain in the Right hand   LOCAL MEDICATIONS USED:  NONE  SPECIMEN:  Biopsy / Limited Resection  DISPOSITION OF SPECIMEN:  PATHOLOGY  COUNTS:  YES  TOURNIQUET:  * Missing tourniquet times found for documented tourniquets in log: [id number] *  DICTATION: .Dragon Dictation  PLAN OF CARE: Admit to inpatient   PATIENT DISPOSITION:  PACU - hemodynamically stable.   Delay start of Pharmacological VTE agent (>24hrs) due to surgical blood loss or risk of bleeding: yes

## 2020-05-26 NOTE — Plan of Care (Signed)

## 2020-05-26 NOTE — Op Note (Signed)
Patient was taken to the operating suite and after induction of adequate general anesthetic the right upper extremity was prepped and draped in the usual sterile fashion.  At this point time chronic wounds in the webspace between the thumb and small finger were approached surgically.  Patient had a previous ray amputation extreme in the right followed by long and ring metacarpal phalangeal joint level amputations but presented with drainage and wound breakdown.  Cultures aerobic anaerobic and stat Gram stain were established and taken.  We thoroughly debrided the skin flaps using a 15 blade.  We incised dorsally over the long metacarpal down to the midshaft area.  We dissected till there was no purulence.  We remove the distal third of the metacarpal bones of the long and ring finger using a bone cutter.  These were sent for pathologic confirmation and bone cultures.  After thorough debridement of the skin edges with a 15 blade we thoroughly irrigated with 3 L of normal saline.  Once this was done we placed a Penrose drain in the deep part of the wound between the long and ring metacarpal heads and made a small stab wound dorsally and proximally to exit the Penrose drain.  We then loosely closed the skin flaps with 4 nylon and loose interrupted 4-0 nylon sutures.  We dressed with Xeroform, 4 x 4's, and a compressive dressing.  The patient tolerated this procedure well went to recovery room in stable fashion.

## 2020-05-26 NOTE — Progress Notes (Signed)
Pharmacy Antibiotic Note  [name] is a 59 y.o. male admitted on 05/26/2020 with R hand infection. He is going for debridement in OR today. Pharmacy has been consulted for zosyn and vancomycin dosing. He is noted with ESRD on HD MWF and plans noted for HD 6/24 then MWF  Plan: -vancomycin 2000mg  IV x1 followed by 1000mg  IV on 6/24 post HD then 1000mg  IV post HD MWF -Zosyn 3.375gm IV x1 then 2.25gm IV q8h -Will follow plans for length of therapy  Height: 6\' 4"  (193 cm) Weight: 91 kg (200 lb 9.9 oz) IBW/kg (Calculated) : 86.8  Temp (24hrs), Avg:98.2 F (36.8 C), Min:98.2 F (36.8 C), Max:98.2 F (36.8 C)  Recent Labs  Lab 05/26/20 1315  WBC 11.2*  CREATININE 10.15*    Estimated Creatinine Clearance: 9.4 mL/min (A) (by C-G formula based on SCr of 10.15 mg/dL (H)).    No Known Allergies  Antimicrobials this admission: 6/23 vancomcyin 6/23 zosyn  Dose adjustments this admission:   Microbiology results:   Thank you for allowing pharmacy to be a part of this patient's care.  Hildred Laser, PharmD Clinical Pharmacist **Pharmacist phone directory can now be found on [email or web address] ([email or web address]).  Listed under [place].

## 2020-05-26 NOTE — Transfer of Care (Signed)
Immediate Anesthesia Transfer of Care Note  Patient: Ory A Pretty  Procedure(s) Performed: IRRIGATION AND DEBRIDEMENT RIGHT HAND (Right Hand)  Patient Location: PACU  Anesthesia Type:General  Level of Consciousness: awake and patient cooperative  Airway & Oxygen Therapy: Patient Spontanous Breathing and Patient connected to nasal cannula oxygen  Post-op Assessment: Report given to RN and Post -op Vital signs reviewed and stable  Post vital signs: Reviewed and stable  Last Vitals:  Vitals Value Taken Time  BP 197/94 05/26/20 1829  Temp    Pulse 80 05/26/20 1831  Resp 9 05/26/20 1831  SpO2 95 % 05/26/20 1831  Vitals shown include unvalidated device data.  Last Pain:  Vitals:   05/26/20 1335  TempSrc:   PainSc: 9       Patients Stated Pain Goal: 0 (95/28/41 3244)  Complications: No complications documented.

## 2020-05-26 NOTE — Anesthesia Procedure Notes (Signed)
Procedure Name: LMA Insertion Date/Time: 05/26/2020 5:23 PM Performed by: Babs Bertin, CRNA Pre-anesthesia Checklist: Patient identified, Emergency Drugs available, Suction available and Patient being monitored Patient Re-evaluated:Patient Re-evaluated prior to induction Oxygen Delivery Method: Circle System Utilized Preoxygenation: Pre-oxygenation with 100% oxygen Induction Type: IV induction Ventilation: Mask ventilation without difficulty LMA: LMA inserted LMA Size: 5.0 Number of attempts: 1 Airway Equipment and Method: Bite block Placement Confirmation: positive ETCO2 Tube secured with: Tape Dental Injury: Teeth and Oropharynx as per pre-operative assessment
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
## 2020-05-27 ENCOUNTER — Encounter (HOSPITAL_COMMUNITY): Payer: Self-pay | Admitting: Orthopedic Surgery

## 2020-05-27 LAB — CBC
HCT: 30.1 % — ABNORMAL LOW (ref 39.0–52.0)
Hemoglobin: 8.8 g/dL — ABNORMAL LOW (ref 13.0–17.0)
MCH: 22.7 pg — ABNORMAL LOW (ref 26.0–34.0)
MCHC: 29.2 g/dL — ABNORMAL LOW (ref 30.0–36.0)
MCV: 77.6 fL — ABNORMAL LOW (ref 80.0–100.0)
Platelets: 152 10*3/uL (ref 150–400)
RBC: 3.88 MIL/uL — ABNORMAL LOW (ref 4.22–5.81)
RDW: 19.3 % — ABNORMAL HIGH (ref 11.5–15.5)
WBC: 10.5 10*3/uL (ref 4.0–10.5)
nRBC: 0 % (ref 0.0–0.2)

## 2020-05-27 LAB — COMPREHENSIVE METABOLIC PANEL
ALT: 13 U/L (ref 0–44)
AST: 12 U/L — ABNORMAL LOW (ref 15–41)
Albumin: 2.6 g/dL — ABNORMAL LOW (ref 3.5–5.0)
Alkaline Phosphatase: 62 U/L (ref 38–126)
Anion gap: 18 — ABNORMAL HIGH (ref 5–15)
BUN: 53 mg/dL — ABNORMAL HIGH (ref 8–23)
CO2: 25 mmol/L (ref 22–32)
Calcium: 8.6 mg/dL — ABNORMAL LOW (ref 8.9–10.3)
Chloride: 94 mmol/L — ABNORMAL LOW (ref 98–111)
Creatinine, Ser: 11.04 mg/dL — ABNORMAL HIGH (ref 0.61–1.24)
GFR calc Af Amer: 5 mL/min — ABNORMAL LOW (ref >60)
GFR calc non Af Amer: 4 mL/min — ABNORMAL LOW (ref >60)
Glucose, Bld: 128 mg/dL — ABNORMAL HIGH (ref 70–99)
Potassium: 4.1 mmol/L (ref 3.5–5.1)
Sodium: 137 mmol/L (ref 135–145)
Total Bilirubin: 1.1 mg/dL (ref 0.3–1.2)
Total Protein: 7.5 g/dL (ref 6.5–8.1)

## 2020-05-27 LAB — LACTIC ACID, PLASMA: Lactic Acid, Venous: 0.9 mmol/L (ref 0.5–1.9)

## 2020-05-27 LAB — MRSA PCR SCREENING: MRSA by PCR: NEGATIVE

## 2020-05-27 LAB — GLUCOSE, CAPILLARY
Glucose-Capillary: 93 mg/dL (ref 70–99)
Glucose-Capillary: 110 mg/dL — ABNORMAL HIGH (ref 70–99)
Glucose-Capillary: 89 mg/dL (ref 70–99)
Glucose-Capillary: 155 mg/dL — ABNORMAL HIGH (ref 70–99)

## 2020-05-27 MED ORDER — CALCITRIOL 0.5 MCG PO CAPS
2.0000 ug | ORAL_CAPSULE | ORAL | Status: DC
  Administered 2020-05-31: 2 ug via ORAL
  Filled 2020-05-27 (×2): qty 4

## 2020-05-27 MED ORDER — HYDROMORPHONE HCL 1 MG/ML IJ SOLN
INTRAMUSCULAR | Status: AC
  Filled 2020-05-27: qty 0.5

## 2020-05-27 MED ORDER — VANCOMYCIN HCL IN DEXTROSE 1-5 GM/200ML-% IV SOLN
INTRAVENOUS | Status: AC
  Filled 2020-05-27: qty 200

## 2020-05-27 MED ORDER — OXYCODONE-ACETAMINOPHEN 7.5-325 MG PO TABS
1.0000 | ORAL_TABLET | ORAL | Status: DC | PRN
  Administered 2020-05-27 – 2020-06-01 (×24): 1 via ORAL
  Filled 2020-05-27 (×24): qty 1

## 2020-05-27 MED ORDER — CHLORHEXIDINE GLUCONATE CLOTH 2 % EX PADS: 6.0000 | MEDICATED_PAD | Freq: Every day | CUTANEOUS | Status: DC

## 2020-05-27 NOTE — Progress Notes (Signed)
Progress Note    [name]  WCH:[id number] DOB: 1961/11/19  DOA: 05/26/2020 PCP: [name], FNP    Brief Narrative:    Medical records reviewed and are as summarized below:  [name] is an 59 y.o. male with a past medical history that includes end-stage renal disease a Monday Wednesday Friday dialysis schedule, hypertension, peripheral vascular disease status post bilateral below-the-knee amputation, diabetes, June 23 for incision and debridement of right middle and ring finger surgical wound (fingers amputated 5/22) per ortho. I and D done 6/23 and patient dialyzed 6/24 as he missed regular dialysis. Post op pain difficult to control  Assessment/Plan:   Principal Problem:   Infection of right hand Active Problems:   End stage renal disease on dialysis (HCC)   Type 2 diabetes mellitus (HCC)   HTN (hypertension)   PVD (peripheral vascular disease) (HCC)   Neuropathic pain   Diabetic neuropathy (HCC)   HLD (hyperlipidemia)   GERD (gastroesophageal reflux disease)  Infection of right hand:vPatient has history of right index, middle and ring finger amputation 2/2 to ischemic changes. s/p irrigation and debridement of right hand 6/23 by Dr. Burney Gauze. He remains afebrile no leukocytosis. Startedbroad-spectrum antibiotics Vanco and Zosyn on admission. Pain difficult to control. Home meds include oxy 7.5/325 of which he take 2 at a time. Ortho recommends awaiting final bone cultures from operating room to determine antibiotic treatment.  -continue current antibiotics until cultures result. -adjust pain meds -monitor  ESRD on hemodialysis Monday, Wednesday and Friday. He missed dialysis on day of surgery. Appreciate nephrology assistance. Dialysis today and tomorrow planned -Continue Renvela and calcium carbonate  Hypertension: Blood pressure elevated at presentation. Improving after dialysis and improved pain control.  -Continue Coreg.  - Monitor blood  pressure closely  Diabetes mellitus: home meds include Tradjenta, and lantus. Lantus initially held.  Started on very sensitive sliding scale insulin.   A1c 5.3. -continue to hold lantus for now -SSI -continue oral meds -monitor  Diabetic neuropathy: - Continue Lyrica  GERD: stable at baseline - Continue Pepcid  PVD status post bilateral BKA: - Continue statin  Microcytic anemia: -H&H is currently stable.  9.3/31.5, MCV: 78.8   Family Communication/Anticipated D/C date and plan/Code Status   DVT prophylaxis: Code Status: Full Code.  Family Communication: patient Disposition Plan: Status is: Inpatient  Remains inpatient appropriate because:Inpatient level of care appropriate due to severity of illness   Dispo: The patient is from: Home              Anticipated d/c is to: Home              Anticipated d/c date is: 1 day              Patient currently is not medically stable to d/c.   Medical Consultants:    [facility]  [place]   Anti-Infectives:    Vancomycin 6/23>>  Zosyn 6/23>>  Subjective:   Complains continued pain right hand. Denies nausea  Objective:    Vitals:   05/27/20 0800 05/27/20 0830 05/27/20 0900 05/27/20 0930  BP: (!) 153/75 (!) 145/76 (!) 155/81 (!) (P) 164/81  Pulse: 76 76 78 (P) 78  Resp: 10 12 14  (P) 12  Temp:      TempSrc:      SpO2:      Weight:      Height:        Intake/Output Summary (Last 24 hours) at 05/27/2020 1000 Last data filed  at 05/27/2020 0740 Gross per 24 hour  Intake 1140 ml  Output 20 ml  Net 1120 ml   Filed Weights   05/26/20 1200 05/26/20 1250 05/27/20 0740  Weight: 92.5 kg 91 kg 94.8 kg    Exam: General: Awake alert no acute distress CV: Regular rate and rhythm no murmur gallop or rub L below the knee amputee Respiratory: No increased work of breathing breath sounds are clear bilaterally hear no wheeze no crackles Abdomen: Nondistended soft positive bowel sounds nontender to  palpation no guarding or rebounding Musculoskeletal: Bilateral below the knee to the otherwise joints without Dressing to his right hand dry and intact one finger exposed it is warm to touch Neuro alert and oriented x3 speech clear facial symmetry  Data Reviewed:   I have personally reviewed following labs and imaging studies:  Labs: Labs show the following:   Basic Metabolic Panel: Recent Labs  Lab 05/26/20 1315 05/27/20 0402  NA 136 137  K 4.1 4.1  CL 93* 94*  CO2 26 25  GLUCOSE 110* 128*  BUN 48* 53*  CREATININE 10.15* 11.04*  CALCIUM 9.2 8.6*  MG 2.3  --    GFR Estimated Creatinine Clearance: 8.6 mL/min (A) (by C-G formula based on SCr of 11.04 mg/dL (H)). Liver Function Tests: Recent Labs  Lab 05/26/20 1315 05/27/20 0402  AST 15 12*  ALT 17 13  ALKPHOS 70 62  BILITOT 1.0 1.1  PROT 8.0 7.5  ALBUMIN 3.0* 2.6*   No results for input(s): LIPASE, AMYLASE in the last 168 hours. No results for input(s): AMMONIA in the last 168 hours. Coagulation profile No results for input(s): INR, PROTIME in the last 168 hours.  CBC: Recent Labs  Lab 05/26/20 1315 05/27/20 0402  WBC 11.2* 10.5  NEUTROABS 8.0*  --   HGB 9.3* 8.8*  HCT 31.5* 30.1*  MCV 78.8* 77.6*  PLT 163 152   Cardiac Enzymes: No results for input(s): CKTOTAL, CKMB, CKMBINDEX, TROPONINI in the last 168 hours. BNP (last 3 results) No results for input(s): PROBNP in the last 8760 hours. CBG: Recent Labs  Lab 05/26/20 1248 05/26/20 1554 05/26/20 1852 05/26/20 2025 05/27/20 0632  GLUCAP 108* 84 98 94 93   D-Dimer: No results for input(s): DDIMER in the last 72 hours. Hgb A1c: Recent Labs    05/26/20 1318  HGBA1C 5.3   Lipid Profile: No results for input(s): CHOL, HDL, LDLCALC, TRIG, CHOLHDL, LDLDIRECT in the last 72 hours. Thyroid function studies: No results for input(s): TSH, T4TOTAL, T3FREE, THYROIDAB in the last 72 hours.  Invalid input(s): FREET3 Anemia work up: Recent Labs     05/26/20 2203  FERRITIN 742*  TIBC 188*  IRON 12*   Sepsis Labs: Recent Labs  Lab 05/26/20 1315 05/26/20 2203 05/27/20 0402  WBC 11.2*  --  10.5  LATICACIDVEN  --  0.9  --     Microbiology Recent Results (from the past 240 hour(s))  SARS Coronavirus 2 by RT PCR (hospital order, performed in [facility] hospital lab) Nasopharyngeal Nasopharyngeal Swab     Status: None   Collection Time: 05/26/20 12:19 PM   Specimen: Nasopharyngeal Swab  Result Value Ref Range Status   SARS Coronavirus 2 NEGATIVE NEGATIVE Final    Comment: (NOTE) SARS-CoV-2 target nucleic acids are NOT DETECTED.  The SARS-CoV-2 RNA is generally detectable in upper and lower respiratory specimens during the acute phase of infection. The lowest concentration of SARS-CoV-2 viral copies this assay can detect is 250 copies /  mL. A negative result does not preclude SARS-CoV-2 infection and should not be used as the sole basis for treatment or other patient management decisions.  A negative result may occur with improper specimen collection / handling, submission of specimen other than nasopharyngeal swab, presence of viral mutation(s) within the areas targeted by this assay, and inadequate number of viral copies (<250 copies / mL). A negative result must be combined with clinical observations, patient history, and epidemiological information.  Fact Sheet for Patients:   StrictlyIdeas.no  Fact Sheet for Healthcare Providers: BankingDealers.co.za  This test is not yet approved or  cleared by the [place] FDA and has been authorized for detection and/or diagnosis of SARS-CoV-2 by FDA under an Emergency Use Authorization (EUA).  This EUA will remain in effect (meaning this test can be used) for the duration of the COVID-19 declaration under Section 564(b)(1) of the Act, 21 U.S.C. section 360bbb-3(b)(1), unless the authorization is terminated or revoked  sooner.  Performed at [facility], [place] [address], [place], [place] [address]   Aerobic/Anaerobic Culture (surgical/deep wound)     Status: None (Preliminary result)   Collection Time: 05/26/20  6:07 PM   Specimen: PATH Bone biopsy; Tissue  Result Value Ref Range Status   Specimen Description TISSUE RIGHT HAND  Final   Special Requests BONE  Final   Gram Stain   Final    RARE WBC PRESENT, PREDOMINANTLY MONONUCLEAR NO ORGANISMS SEEN Performed at [facility], [address], [place], [place] [address]    Culture PENDING  Incomplete   Report Status PENDING  Incomplete    Procedures and diagnostic studies:  No results found.  Medications:   . atorvastatin  20 mg Oral QHS  . carvedilol  12.5 mg Oral BID WC  . Chlorhexidine Gluconate Cloth  6 each Topical Q0600  . HYDROmorphone      . insulin aspart  0-5 Units Subcutaneous QHS  . insulin aspart  0-6 Units Subcutaneous TID WC  . linagliptin  5 mg Oral Daily  . pregabalin  100 mg Oral QHS  . sevelamer carbonate  2,400 mg Oral TID WC   Continuous Infusions: . piperacillin-tazobactam (ZOSYN)  IV    . vancomycin    . vancomycin    . [START ON 05/28/2020] vancomycin    . vancomycin       LOS: 1 day   [name] Gunning NP  [facility]   How to contact the [facility] Attending or Consulting provider [place] or covering provider during after hours [place], for this patient?  1. Check the care team in [facility] and look for a) attending/consulting [facility] provider listed and b) the [facility] team listed 2. Log into [email or web address] and use [place]'s universal password to access. If you do not have the password, please contact the hospital operator. 3. Locate the [facility] provider you are looking for under [facility] and page to a number that you can be directly reached. 4. If you still have difficulty reaching the provider, please page the [facility] (Director on Call) for the Hospitalists listed on amion for assistance.  05/27/2020,  10:00 AM

## 2020-05-27 NOTE — Procedures (Signed)
Patient was seen on dialysis and the procedure was supervised.  BFR 400  Via AVF BP is  164/81.   Patient appears to be tolerating treatment well- in pain  -  Giving PRN pain med  Louis Meckel 05/27/2020

## 2020-05-27 NOTE — Plan of Care (Signed)

## 2020-05-27 NOTE — Progress Notes (Addendum)
[facility] Progress Note   Subjective:   Pt seen in HD. Having significant hand pain with any movement. Has not noticed any further pain/swelling on the L arm. Denies SOB, CP, dizziness, abdominal pain, N/V/D. BFR 400 with good access pressures.   Objective Vitals:   05/27/20 0800 05/27/20 0830 05/27/20 0900 05/27/20 0930  BP: (!) 153/75 (!) 145/76 (!) 155/81 (!) 164/81  Pulse: 76 76 78   Resp: 10 12 14    Temp:      TempSrc:      SpO2:      Weight:      Height:       Physical Exam General: Well developed male, alert and in NAD Heart: RRR, no murmurs, normal S1/S2 Lungs: CTA bilaterally without wheezing, rhonchi or rales Abdomen: Soft, non-tender, non-distended, +BS Extremities: B/l BKA, no LE edema. R hand wrapped Dialysis Access:  LUE AVF cannulated, mild erythema/swelling along blood vessel proximally, improved  Additional Objective Labs: Basic Metabolic Panel: Recent Labs  Lab 05/26/20 1315 05/27/20 0402  NA 136 137  K 4.1 4.1  CL 93* 94*  CO2 26 25  GLUCOSE 110* 128*  BUN 48* 53*  CREATININE 10.15* 11.04*  CALCIUM 9.2 8.6*   Liver Function Tests: Recent Labs  Lab 05/26/20 1315 05/27/20 0402  AST 15 12*  ALT 17 13  ALKPHOS 70 62  BILITOT 1.0 1.1  PROT 8.0 7.5  ALBUMIN 3.0* 2.6*   CBC: Recent Labs  Lab 05/26/20 1315 05/27/20 0402  WBC 11.2* 10.5  NEUTROABS 8.0*  --   HGB 9.3* 8.8*  HCT 31.5* 30.1*  MCV 78.8* 77.6*  PLT 163 152   Blood Culture    Component Value Date/Time   SDES TISSUE RIGHT HAND 05/26/2020 1807   SPECREQUEST BONE 05/26/2020 1807   CULT PENDING 05/26/2020 1807   REPTSTATUS PENDING 05/26/2020 1807    CBG: Recent Labs  Lab 05/26/20 1248 05/26/20 1554 05/26/20 1852 05/26/20 2025 05/27/20 0632  GLUCAP 108* 84 98 94 93   Iron Studies:  Recent Labs    05/26/20 2203  IRON 12*  TIBC 188*  FERRITIN 742*   Medications: . piperacillin-tazobactam (ZOSYN)  IV    . vancomycin    . vancomycin    .  [START ON 05/28/2020] vancomycin    . vancomycin     . atorvastatin  20 mg Oral QHS  . carvedilol  12.5 mg Oral BID WC  . Chlorhexidine Gluconate Cloth  6 each Topical Q0600  . HYDROmorphone      . insulin aspart  0-5 Units Subcutaneous QHS  . insulin aspart  0-6 Units Subcutaneous TID WC  . linagliptin  5 mg Oral Daily  . pregabalin  100 mg Oral QHS  . sevelamer carbonate  2,400 mg Oral TID WC    Dialysis Orders: Center: [facility] on MWF. MWF, 200NRe, BFR 425, DFR 800, EDW 90kg, 2K/2.5Ca, AVF 16g, no heparin mircera 200mg  IV q 2 weeks- last dose 258mcg on 6/9 Venofer 100mg  q HD- received 0/10 doses  Calcitriol 2.40mcg PO q HD Renvela 800mg  4 tabs PO TID with meals  Recent labs: 6/21 Hgb 9.8, Tsat 17%, Ferritin 642, Plt 216  Assessment/Plan: 1.  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 Receiving IV vanc and zosyn. Significant post op pain. Management per ortho.  2.  ESRD:  Typically dialyzes MWF, last HD 6/21 and missed HD 6/23 due to hospital visit. HD today then  resume MWF schedule tomorrow.  3. L arm erythema: Mild erythema and induration of the L arm proximal to AVF. Suspect this may be superficial thrombophlebitis from procedure vs some swelling residual from procedure or just augmentation of the bruit. Improved today. No swelling of the neck or lower arm and AVF working well. Getting IV abx which should cover any infection in the AVF. Already has blood cultures ordered. Continue to monitor. Seems better today  4.  Hypertension/volume: BP moderately elevated. No evidence of volume overload on exam. EDW at outpatient center just lowered to 90kg. Volume management with HD today and tomorrow.  5.  Anemia: Hgb 8.8. Aranesp ordered with HD today. Hold IV Fe for now due to possible infection. 6.  Metabolic bone disease: Corrected 9.7. Continue calcitriol and renvela. Check phos with next labs.  7.  Nutrition:  Renal diet/fluid  restrictions.   Anice Paganini, PA-C 05/27/2020, 9:41 AM  [facility] Pager: [phone]  Patient seen and examined, agree with above note with above modifications. Is having post op pain in dialysis -  PRNs ordered.  Left upper extremity findings appear better-  No issues with access.  Planning on another HD tomorrow to get back on schedule and complete his 3 treatments for the week  Corliss Parish, MD 05/27/2020

## 2020-05-28 LAB — CBC
HCT: 30.9 % — ABNORMAL LOW (ref 39.0–52.0)
Hemoglobin: 9.2 g/dL — ABNORMAL LOW (ref 13.0–17.0)
MCH: 23.2 pg — ABNORMAL LOW (ref 26.0–34.0)
MCHC: 29.8 g/dL — ABNORMAL LOW (ref 30.0–36.0)
MCV: 78 fL — ABNORMAL LOW (ref 80.0–100.0)
Platelets: 167 10*3/uL (ref 150–400)
RBC: 3.96 MIL/uL — ABNORMAL LOW (ref 4.22–5.81)
RDW: 19.6 % — ABNORMAL HIGH (ref 11.5–15.5)
WBC: 9.2 10*3/uL (ref 4.0–10.5)
nRBC: 0 % (ref 0.0–0.2)

## 2020-05-28 LAB — BASIC METABOLIC PANEL
Anion gap: 17 — ABNORMAL HIGH (ref 5–15)
BUN: 31 mg/dL — ABNORMAL HIGH (ref 8–23)
CO2: 28 mmol/L (ref 22–32)
Calcium: 8.5 mg/dL — ABNORMAL LOW (ref 8.9–10.3)
Chloride: 91 mmol/L — ABNORMAL LOW (ref 98–111)
Creatinine, Ser: 7.62 mg/dL — ABNORMAL HIGH (ref 0.61–1.24)
GFR calc Af Amer: 8 mL/min — ABNORMAL LOW (ref >60)
GFR calc non Af Amer: 7 mL/min — ABNORMAL LOW (ref >60)
Glucose, Bld: 153 mg/dL — ABNORMAL HIGH (ref 70–99)
Potassium: 3.8 mmol/L (ref 3.5–5.1)
Sodium: 136 mmol/L (ref 135–145)

## 2020-05-28 LAB — GLUCOSE, CAPILLARY
Glucose-Capillary: 132 mg/dL — ABNORMAL HIGH (ref 70–99)
Glucose-Capillary: 161 mg/dL — ABNORMAL HIGH (ref 70–99)
Glucose-Capillary: 127 mg/dL — ABNORMAL HIGH (ref 70–99)
Glucose-Capillary: 121 mg/dL — ABNORMAL HIGH (ref 70–99)

## 2020-05-28 LAB — VANCOMYCIN, RANDOM: Vancomycin Rm: 11

## 2020-05-28 MED ORDER — FERROUS SULFATE 325 (65 FE) MG PO TABS
325.0000 mg | ORAL_TABLET | Freq: Every day | ORAL | Status: DC
  Administered 2020-05-29 – 2020-06-01 (×4): 325 mg via ORAL
  Filled 2020-05-28 (×4): qty 1

## 2020-05-28 MED ORDER — HEPARIN SODIUM (PORCINE) 5000 UNIT/ML IJ SOLN
5000.0000 [IU] | Freq: Three times a day (TID) | INTRAMUSCULAR | Status: DC
  Administered 2020-05-29 – 2020-06-01 (×8): 5000 [IU] via SUBCUTANEOUS
  Filled 2020-05-28 (×9): qty 1

## 2020-05-28 MED ORDER — VANCOMYCIN HCL 1750 MG/350ML IV SOLN
1750.0000 mg | INTRAVENOUS | Status: AC
  Administered 2020-05-28: 1750 mg via INTRAVENOUS
  Filled 2020-05-28: qty 350

## 2020-05-28 MED ORDER — DARBEPOETIN ALFA 100 MCG/0.5ML IJ SOSY
PREFILLED_SYRINGE | INTRAMUSCULAR | Status: AC
  Administered 2020-05-28: 100 ug via INTRAVENOUS
  Filled 2020-05-28: qty 0.5

## 2020-05-28 MED ORDER — DARBEPOETIN ALFA 100 MCG/0.5ML IJ SOSY: 100.0000 ug | PREFILLED_SYRINGE | INTRAMUSCULAR | Status: DC

## 2020-05-28 MED ORDER — VANCOMYCIN HCL IN DEXTROSE 1-5 GM/200ML-% IV SOLN: 1000.0000 mg | INTRAVENOUS | Status: DC

## 2020-05-28 MED ORDER — HYDROMORPHONE HCL 1 MG/ML IJ SOLN
INTRAMUSCULAR | Status: AC
  Administered 2020-05-28: 1 mg via INTRAVENOUS
  Filled 2020-05-28: qty 1

## 2020-05-28 MED ORDER — CALCITRIOL 0.5 MCG PO CAPS
ORAL_CAPSULE | ORAL | Status: AC
  Administered 2020-05-28: 2 ug via ORAL
  Filled 2020-05-28: qty 4

## 2020-05-28 NOTE — Plan of Care (Signed)
  Problem: Pain Managment: Goal: General experience of comfort will improve Outcome: Progressing   

## 2020-05-28 NOTE — Progress Notes (Signed)
PROGRESS NOTE  [name] XBD:[id number] DOB: December 31, 1960 DOA: 05/26/2020 PCP: [name], FNP  HPI/Recap of past 24 hours: [name] is an 59 y.o. male with a past medical history that includes end-stage renal disease a Monday Wednesday Friday dialysis schedule, hypertension, peripheral vascular disease status post bilateral below-the-knee amputation, diabetes, June 23 for incision and debridement of right middle and ring finger surgical wound (fingers amputated 5/22) per ortho. I and D done 6/23 and patient dialyzed 6/24 as he missed regular dialysis. Post op pain difficult to control.   Hemodialyzed on 05/27/2020 and 05/28/2020.  On IV antibiotics empirically IV vancomycin and Zosyn.  Deep wound culture showing moderate WBC present, abundant gram-negative rods, holding for possible anaerobe.  Continue to follow deep wound cultures for ID and sensitivities.   05/28/20: Seen and examined at his bedside at the hemodialysis center.  Reports pain in his right hand.  Pain management in place.  No other complaints.      Assessment/Plan: Principal Problem:   Infection of right hand Active Problems:   HTN (hypertension)   End stage renal disease on dialysis ([facility])   Type 2 diabetes mellitus (HCC)   HLD (hyperlipidemia)   PVD (peripheral vascular disease) (HCC)   GERD (gastroesophageal reflux disease)   Neuropathic pain   Diabetic neuropathy (HCC)  Infection of right hand: Patient has history of right index, middle and ring finger amputation2/2 to ischemic changes. s/p irrigation and debridement of right hand 6/23 by Dr.Weingold. He remains afebrile no leukocytosis. Continue broad-spectrum IV antibiotics Vanco and Zosyn.  Pain difficult to control. Home meds include oxy 7.5/325 of which he take 2 at a time. Ortho recommends awaiting final bone cultures from operating room to determine antibiotic treatment.               Deep wound culture showing moderate WBC present,  abundant gram-negative                      rods, holding for possible anaerobe.  Continue to follow deep wound cultures for ID              and sensitivities.   ESRD on hemodialysis Monday, Wednesday and Friday.  He missed dialysis on day of surgery.  Appreciate nephrology assistance.  Dialysis on 05/27/2020 and 05/28/2020. -Continue Renvela and calcium carbonate  Anemia of chronic disease/iron deficiency anemia Continue ferrous sulfate Hemoglobin stable 9.2 from 8.8, MCV 78. No overt bleeding.  Hypertension:  Blood pressure is at goal.   -Continue Coreg.  -Continue to monitor blood pressure closely  Type II diabetes mellitus, well controlled:  home meds include Tradjenta, and lantus.  Lantus initially held.  Started on very sensitive sliding scale insulin.   Hemoglobin A1c 5.3. Avoid hypoglycemia  Diabetic neuropathy: - Continue Lyrica  Severe PVD status post bilateral BKA: - Continue statin   Family Communication/Anticipated D/C date and plan/Code Status   DVT prophylaxis: Subcu heparin 3 times daily. Code Status: Full Code.  Family Communication:  None at bedside. Disposition Plan: Status is: Inpatient  Remains inpatient appropriate because:Inpatient level of care appropriate due to severity of illness   Dispo: The patient is from: Home  Anticipated d/c is to: Home  Anticipated d/c date is: 05/29/2020  Patient currently is not medically stable to d/c due to ongoing treatment with broad-spectrum IV antibiotics, awaiting deep wound culture results.   Medical Consultants:    [facility]  [place]   Anti-Infectives:  Vancomycin 6/23>>  Zosyn 6/23>>      Objective: Vitals:   05/28/20 1030 05/28/20 1100 05/28/20 1123 05/28/20 1232  BP: 114/61 127/64 (!) 143/65 136/64  Pulse: 72 70 72 75  Resp: 12 11 10 15   Temp:   98.4 F (36.9 C) 98.2 F (36.8 C)  TempSrc:   Oral Oral  SpO2:        Weight:   86.1 kg   Height:        Intake/Output Summary (Last 24 hours) at 05/28/2020 1352 Last data filed at 05/28/2020 1123 Gross per 24 hour  Intake 240 ml  Output 2000 ml  Net -1760 ml   Filed Weights   05/27/20 1145 05/28/20 0700 05/28/20 1123  Weight: 93.3 kg 89.9 kg 86.1 kg    Exam:  . General: 59 y.o. year-old male well developed well nourished in no acute distress.  Alert and oriented x3. . Cardiovascular: Regular rate and rhythm with no rubs or gallops.  No thyromegaly or JVD noted.   Marland Kitchen Respiratory: Clear to auscultation with no wheezes or rales. Good inspiratory effort. . Abdomen: Soft nontender nondistended with normal bowel sounds x4 quadrants. . Musculoskeletal: Bilateral below the knee amputation.  Multiple fingers missing from the right hand, in surgical dressing. Marland Kitchen Psychiatry: Mood is appropriate for condition and setting   Data Reviewed: CBC: Recent Labs  Lab 05/26/20 1315 05/27/20 0402 05/28/20 0352  WBC 11.2* 10.5 9.2  NEUTROABS 8.0*  --   --   HGB 9.3* 8.8* 9.2*  HCT 31.5* 30.1* 30.9*  MCV 78.8* 77.6* 78.0*  PLT 163 152 431   Basic Metabolic Panel: Recent Labs  Lab 05/26/20 1315 05/27/20 0402 05/28/20 0352  NA 136 137 136  K 4.1 4.1 3.8  CL 93* 94* 91*  CO2 26 25 28   GLUCOSE 110* 128* 153*  BUN 48* 53* 31*  CREATININE 10.15* 11.04* 7.62*  CALCIUM 9.2 8.6* 8.5*  MG 2.3  --   --    GFR: Estimated Creatinine Clearance: 12.4 mL/min (A) (by C-G formula based on SCr of 7.62 mg/dL (H)). Liver Function Tests: Recent Labs  Lab 05/26/20 1315 05/27/20 0402  AST 15 12*  ALT 17 13  ALKPHOS 70 62  BILITOT 1.0 1.1  PROT 8.0 7.5  ALBUMIN 3.0* 2.6*   No results for input(s): LIPASE, AMYLASE in the last 168 hours. No results for input(s): AMMONIA in the last 168 hours. Coagulation Profile: No results for input(s): INR, PROTIME in the last 168 hours. Cardiac Enzymes: No results for input(s): CKTOTAL, CKMB, CKMBINDEX, TROPONINI in the last  168 hours. BNP (last 3 results) No results for input(s): PROBNP in the last 8760 hours. HbA1C: Recent Labs    05/26/20 1318  HGBA1C 5.3   CBG: Recent Labs  Lab 05/27/20 0632 05/27/20 1318 05/27/20 1650 05/27/20 2203 05/28/20 0632  GLUCAP 93 110* 89 155* 132*   Lipid Profile: No results for input(s): CHOL, HDL, LDLCALC, TRIG, CHOLHDL, LDLDIRECT in the last 72 hours. Thyroid Function Tests: No results for input(s): TSH, T4TOTAL, FREET4, T3FREE, THYROIDAB in the last 72 hours. Anemia Panel: Recent Labs    05/26/20 2203  FERRITIN 742*  TIBC 188*  IRON 12*   Urine analysis:    Component Value Date/Time   COLORURINE YELLOW 09/10/2018 1927   APPEARANCEUR HAZY (A) 09/10/2018 1927   LABSPEC 1.011 09/10/2018 1927   PHURINE 5.0 09/10/2018 1927   GLUCOSEU 50 (A) 09/10/2018 1927   HGBUR MODERATE (A) 09/10/2018 1927  BILIRUBINUR NEGATIVE 09/10/2018 [place] NEGATIVE 09/10/2018 1927   PROTEINUR 100 (A) 09/10/2018 1927   NITRITE NEGATIVE 09/10/2018 1927   LEUKOCYTESUR NEGATIVE 09/10/2018 1927   Sepsis Labs: @LABRCNTIP (procalcitonin:4,lacticidven:4)  ) Recent Results (from the past 240 hour(s))  SARS Coronavirus 2 by RT PCR (hospital order, performed in [facility] hospital lab) Nasopharyngeal Nasopharyngeal Swab     Status: None   Collection Time: 05/26/20 12:19 PM   Specimen: Nasopharyngeal Swab  Result Value Ref Range Status   SARS Coronavirus 2 NEGATIVE NEGATIVE Final    Comment: (NOTE) SARS-CoV-2 target nucleic acids are NOT DETECTED.  The SARS-CoV-2 RNA is generally detectable in upper and lower respiratory specimens during the acute phase of infection. The lowest concentration of SARS-CoV-2 viral copies this assay can detect is 250 copies / mL. A negative result does not preclude SARS-CoV-2 infection and should not be used as the sole basis for treatment or other patient management decisions.  A negative result may occur with improper specimen  collection / handling, submission of specimen other than nasopharyngeal swab, presence of viral mutation(s) within the areas targeted by this assay, and inadequate number of viral copies (<250 copies / mL). A negative result must be combined with clinical observations, patient history, and epidemiological information.  Fact Sheet for Patients:   StrictlyIdeas.no  Fact Sheet for Healthcare Providers: BankingDealers.co.za  This test is not yet approved or  cleared by the [place] FDA and has been authorized for detection and/or diagnosis of SARS-CoV-2 by FDA under an Emergency Use Authorization (EUA).  This EUA will remain in effect (meaning this test can be used) for the duration of the COVID-19 declaration under Section 564(b)(1) of the Act, 21 U.S.C. section 360bbb-3(b)(1), unless the authorization is terminated or revoked sooner.  Performed at [facility], [place] [address], [place], [place] [address]   Aerobic/Anaerobic Culture (surgical/deep wound)     Status: None (Preliminary result)   Collection Time: 05/26/20  5:33 PM   Specimen: PATH Other; Wound  Result Value Ref Range Status   Specimen Description HAND  Final   Special Requests RIGHT SAMPLE A  Final   Gram Stain   Final    MODERATE WBC PRESENT, PREDOMINANTLY PMN ABUNDANT GRAM NEGATIVE RODS    Culture   Final    HOLDING FOR POSSIBLE ANAEROBE Performed at [facility], [place] [address] [place], [place] [address]    Report Status PENDING  Incomplete  Aerobic/Anaerobic Culture (surgical/deep wound)     Status: None (Preliminary result)   Collection Time: 05/26/20  6:07 PM   Specimen: PATH Bone biopsy; Tissue  Result Value Ref Range Status   Specimen Description TISSUE RIGHT HAND  Final   Special Requests BONE  Final   Gram Stain   Final    RARE WBC PRESENT, PREDOMINANTLY MONONUCLEAR NO ORGANISMS SEEN    Culture   Final    HOLDING FOR POSSIBLE  ANAEROBE Performed at [facility], [address] [place], [place] [address]    Report Status PENDING  Incomplete  MRSA PCR Screening     Status: None   Collection Time: 05/27/20  6:33 PM   Specimen: Nasopharyngeal  Result Value Ref Range Status   MRSA by PCR NEGATIVE NEGATIVE Final    Comment:        The GeneXpert MRSA Assay (FDA approved for NASAL specimens only), is one component of a comprehensive MRSA colonization surveillance program. It is not intended to diagnose  MRSA infection nor to guide or monitor treatment for MRSA infections. Performed at [facility], [place] [address], [place], [place] [address]       Studies: No results found.  Scheduled Meds: . atorvastatin  20 mg Oral QHS  . calcitRIOL  2 mcg Oral Q M,W,F-HD  . carvedilol  12.5 mg Oral BID WC  . Chlorhexidine Gluconate Cloth  6 each Topical Q0600  . darbepoetin (ARANESP) injection - DIALYSIS  100 mcg Intravenous Q Fri-HD  . ferrous sulfate  325 mg Oral Q breakfast  . insulin aspart  0-5 Units Subcutaneous QHS  . insulin aspart  0-6 Units Subcutaneous TID WC  . linagliptin  5 mg Oral Daily  . pregabalin  100 mg Oral QHS  . sevelamer carbonate  2,400 mg Oral TID WC    Continuous Infusions: . piperacillin-tazobactam (ZOSYN)  IV 2.25 g (05/28/20 0454)  . [START ON 05/31/2020] vancomycin       LOS: 2 days     Kayleen Memos, MD [facility] Pager [phone]  If 7PM-7AM, please contact night-coverage [email or web address] Password [email or web address] 05/28/2020, 1:52 PM

## 2020-05-28 NOTE — Progress Notes (Signed)
Pharmacy Antibiotic Note  [name] is a 59 y.o. male admitted on 05/26/2020 with osteo.  Pharmacy has been consulted for Vanco/Zosyn dosing.   ID: Suspected osteo. R hand infection for debridement in OR today. LA WNL.  - Tmax 99.2. WBC WNL.  (6/23 load dose not given): 6/24 vancomcyin>> 6/23 zosyn>>  6/23: R hand:>>  6/25 Pre-HD level 11 (load was not given 6/23)>>reload 1750mg  post-HD    Plan: - Reload Vanco 1750mg  x 1 on 6/25, then back to 1g IV post-HD MWF -Zosyn 3.375gm IV x1 then 2.25gm IV q8h   Height: 6\' 4"  (193 cm) Weight: 89.9 kg (198 lb 3.1 oz) IBW/kg (Calculated) : 86.8  Temp (24hrs), Avg:98.4 F (36.9 C), Min:97.7 F (36.5 C), Max:98.9 F (37.2 C)  Recent Labs  Lab 05/26/20 1315 05/26/20 2203 05/27/20 0402 05/28/20 0352  WBC 11.2*  --  10.5 9.2  CREATININE 10.15*  --  11.04* 7.62*  LATICACIDVEN  --  0.9  --   --   VANCORANDOM  --   --   --  11    Estimated Creatinine Clearance: 12.5 mL/min (A) (by C-G formula based on SCr of 7.62 mg/dL (H)).    No Known Allergies   [name], PharmD, BCPS Clinical Staff Pharmacist [email or web address] [place] 05/28/2020 8:27 AM

## 2020-05-28 NOTE — Progress Notes (Signed)
Patient to HD 

## 2020-05-28 NOTE — Progress Notes (Addendum)
[facility] Progress Note   Subjective:   Pt seen on HD, tolerating procedure well. Hand pain is slightly improved today. Denies any pain in the L arm or hand. No SOB, CP, palpitations, dizziness, abdominal pain, N/V.   Objective Vitals:   05/28/20 0730 05/28/20 0800 05/28/20 0830 05/28/20 0851  BP: (!) 114/59 (!) 105/59 117/61 (!) 141/66  Pulse: 71 71 70 71  Resp: (!) 8 12 10 10   Temp:      TempSrc:      SpO2:      Weight:      Height:       Physical Exam General: Well developed male, alert and in NAD Heart: RRR, no murmurs, normal S1/S2 Lungs: CTA bilaterally without wheezing, rhonchi or rales Abdomen: Soft, non-tender, non-distended, +BS Extremities: B/l BKA, no LE edema. R hand wrapped Dialysis Access:  LUE AVF cannulated, mild swelling along blood vessel proximally, improved  Additional Objective Labs: Basic Metabolic Panel: Recent Labs  Lab 05/26/20 1315 05/27/20 0402 05/28/20 0352  NA 136 137 136  K 4.1 4.1 3.8  CL 93* 94* 91*  CO2 26 25 28   GLUCOSE 110* 128* 153*  BUN 48* 53* 31*  CREATININE 10.15* 11.04* 7.62*  CALCIUM 9.2 8.6* 8.5*   Liver Function Tests: Recent Labs  Lab 05/26/20 1315 05/27/20 0402  AST 15 12*  ALT 17 13  ALKPHOS 70 62  BILITOT 1.0 1.1  PROT 8.0 7.5  ALBUMIN 3.0* 2.6*   CBC: Recent Labs  Lab 05/26/20 1315 05/27/20 0402 05/28/20 0352  WBC 11.2* 10.5 9.2  NEUTROABS 8.0*  --   --   HGB 9.3* 8.8* 9.2*  HCT 31.5* 30.1* 30.9*  MCV 78.8* 77.6* 78.0*  PLT 163 152 167   Blood Culture    Component Value Date/Time   SDES TISSUE RIGHT HAND 05/26/2020 1807   SPECREQUEST BONE 05/26/2020 1807   CULT  05/26/2020 1807    NO GROWTH < 12 HOURS Performed at [facility], [place] [address] [place], [place] [address]    REPTSTATUS PENDING 05/26/2020 1807    CBG: Recent Labs  Lab 05/27/20 0632 05/27/20 1318 05/27/20 1650 05/27/20 2203 05/28/20 0632  GLUCAP 93 110* 89 155* 132*   Iron Studies:  Recent  Labs    05/26/20 2203  IRON 12*  TIBC 188*  FERRITIN 742*   Medications: . piperacillin-tazobactam (ZOSYN)  IV 2.25 g (05/28/20 0454)  . [START ON 05/31/2020] vancomycin    . vancomycin     . atorvastatin  20 mg Oral QHS  . calcitRIOL  2 mcg Oral Q M,W,F-HD  . carvedilol  12.5 mg Oral BID WC  . Chlorhexidine Gluconate Cloth  6 each Topical Q0600  . ferrous sulfate  325 mg Oral Q breakfast  . insulin aspart  0-5 Units Subcutaneous QHS  . insulin aspart  0-6 Units Subcutaneous TID WC  . linagliptin  5 mg Oral Daily  . pregabalin  100 mg Oral QHS  . sevelamer carbonate  2,400 mg Oral TID WC    [facility]: [facility], 200NRe, BFR 425, DFR 800, EDW 90kg, 2K/2.5Ca, AVF 16g, no heparin mircera 200mg  IV q 2 weeks- last dose 223mcg on 6/9 Venofer 100mg  q HD- received 0/10 doses  Calcitriol 2.59mcg PO q HD Renvela 800mg  4 tabs PO TID with meals  Recent labs: 6/21 Hgb 9.8, Tsat 17%, Ferritin 642, Plt 216  Assessment/Plan: 1. R hand infection:S/p amputation of 2nd and 3rd  fingers and debridement 6/23. Receiving IV vanc and zosyn. WBC trending down. Management per ortho.  2. ESRD:Typically dialyzes MWF, last HD 6/21 and missed HD 6/23 due to hospital visit. Now back on schedule, next HD Monday.  3. L arm erythema:Mild erythema and induration of the L arm proximal to AVF. Suspect this may be superficial thrombophlebitis from procedure vs some swelling residual from procedure or just augmentation of the bruit. Improved today. No swelling of the neck or lower arm and AVF working well. Getting IV abx which should cover any infection in the AVF. Continues to improve. 4. Hypertension/volume:BP controlled. No evidence of volume overload on exam. EDW at outpatient center just lowered to 90kg and may need to go down a bit more.  5. Anemia:Hgb 9.2. Aranesp rescheduled for today. Hold IV Fe for now due to infection. 6. Metabolic bone  disease:Corrected 9.7. Continue calcitriol and renvela. Check phos with next labs.  7. Nutrition:Renal diet/fluid restrictions.   Anice Paganini, PA-C 05/28/2020, 9:11 AM  [facility] Pager: [phone]  Patient seen and examined, agree with above note with above modifications. Seen on HD-  Pain seems better controlled-  HD today then not due til Monday-  Not sure what dispo plan is. Continue renal related medications-  Titrated to effect  Corliss Parish, MD 05/28/2020

## 2020-05-28 NOTE — Procedures (Signed)
Patient was seen on dialysis and the procedure was supervised.  BFR 400  Via AVF BP is  114/61.   Patient appears to be tolerating treatment well  Louis Meckel 05/28/2020

## 2020-05-29 LAB — GLUCOSE, CAPILLARY
Glucose-Capillary: 109 mg/dL — ABNORMAL HIGH (ref 70–99)
Glucose-Capillary: 148 mg/dL — ABNORMAL HIGH (ref 70–99)
Glucose-Capillary: 152 mg/dL — ABNORMAL HIGH (ref 70–99)
Glucose-Capillary: 176 mg/dL — ABNORMAL HIGH (ref 70–99)

## 2020-05-29 LAB — CBC
HCT: 30.9 % — ABNORMAL LOW (ref 39.0–52.0)
Hemoglobin: 8.9 g/dL — ABNORMAL LOW (ref 13.0–17.0)
MCH: 22.6 pg — ABNORMAL LOW (ref 26.0–34.0)
MCHC: 28.8 g/dL — ABNORMAL LOW (ref 30.0–36.0)
MCV: 78.4 fL — ABNORMAL LOW (ref 80.0–100.0)
Platelets: 188 10*3/uL (ref 150–400)
RBC: 3.94 MIL/uL — ABNORMAL LOW (ref 4.22–5.81)
RDW: 19.9 % — ABNORMAL HIGH (ref 11.5–15.5)
WBC: 8.1 10*3/uL (ref 4.0–10.5)
nRBC: 0 % (ref 0.0–0.2)

## 2020-05-29 LAB — BASIC METABOLIC PANEL
Anion gap: 16 — ABNORMAL HIGH (ref 5–15)
BUN: 21 mg/dL (ref 8–23)
CO2: 27 mmol/L (ref 22–32)
Calcium: 9.4 mg/dL (ref 8.9–10.3)
Chloride: 95 mmol/L — ABNORMAL LOW (ref 98–111)
Creatinine, Ser: 5.74 mg/dL — ABNORMAL HIGH (ref 0.61–1.24)
GFR calc Af Amer: 11 mL/min — ABNORMAL LOW (ref >60)
GFR calc non Af Amer: 10 mL/min — ABNORMAL LOW (ref >60)
Glucose, Bld: 138 mg/dL — ABNORMAL HIGH (ref 70–99)
Potassium: 4 mmol/L (ref 3.5–5.1)
Sodium: 138 mmol/L (ref 135–145)

## 2020-05-29 LAB — AEROBIC/ANAEROBIC CULTURE W GRAM STAIN (SURGICAL/DEEP WOUND)

## 2020-05-29 LAB — PHOSPHORUS: Phosphorus: 5.7 mg/dL — ABNORMAL HIGH (ref 2.5–4.6)

## 2020-05-29 NOTE — Progress Notes (Addendum)
PROGRESS NOTE  [name] BVQ:[id number] DOB: 01-14-1961 DOA: 05/26/2020 PCP: [name], FNP  HPI/Recap of past 24 hours: [name] is an 59 y.o. male with a past medical history that includes end-stage renal disease a Monday Wednesday Friday dialysis schedule, hypertension, peripheral vascular disease status post bilateral below-the-knee amputation, diabetes, June 23 for incision and debridement of right middle and ring finger surgical wound (fingers amputated 5/22) per ortho. I and D done 6/23 and patient dialyzed 6/24 as he missed regular dialysis. Post op pain difficult to control.   Hemodialyzed on 05/27/2020 and 05/28/2020.  On IV antibiotics empirically IV vancomycin and Zosyn.  Deep wound culture showing moderate WBC present, abundant gram-negative rods, holding for possible anaerobe.  Continue to follow deep wound cultures for ID and sensitivities.   05/29/20: Seen and examined at his bedside.  No acute events overnight.  Deep wound culture taken on 05/26/2020 reveals abundant Bacteroides fragilis, beta-lactamase positive.  Gram stain revealed abundant gram-negative rods, final.  Also revealed rare Alcaligenes Faecalis.  Awaiting sensitivities.  Continue broad-spectrum IV antibiotics.     Assessment/Plan: Principal Problem:   Infection of right hand Active Problems:   HTN (hypertension)   End stage renal disease on dialysis ([place])   Type 2 diabetes mellitus (HCC)   HLD (hyperlipidemia)   PVD (peripheral vascular disease) (HCC)   GERD (gastroesophageal reflux disease)   Neuropathic pain   Diabetic neuropathy (HCC)  Bacteroides fragilis, beta-lactamase positive, Alcaligenes Faecalis Right hand infection: Patient has history of right index, middle and ring finger amputation2/2 to ischemic changes. s/p irrigation and debridement of right hand 6/23 by Dr.Weingold.  Deep wound culture taken on 05/26/2020 reveals abundant Bacteroides fragilis, beta-lactamase  positive.  Gram stain revealed abundant gram-negative rods, final.  Also revealed rare Alcaligenes Faecalis.  Awaiting sensitivities.  Afebrile with no leukocytosis Continue broad-spectrum IV antibiotics vancomycin and Zosyn. Pain control.  ESRD on hemodialysis Monday, Wednesday and Friday.  Appreciate nephrology assistance.  Dialysis on 05/27/2020 and 05/28/2020. -Continue Renvela and calcium carbonate -Next hemodialysis 05/31/2020  Anemia of chronic disease/iron deficiency anemia Continue ferrous sulfate Hemoglobin stable 9.2 from 8.8, MCV 78. No overt bleeding. Aranesp every Friday.  Hypertension:  Blood pressure is at goal.   -Continue Coreg.  -Continue to monitor blood pressure closely  Type II diabetes mellitus, well controlled:  home meds include Tradjenta, and lantus.  Hemoglobin A1c 5.3 Continue insulin sliding scale Avoid hypoglycemia  Diabetic neuropathy: - Continue Lyrica  Severe PVD status post bilateral BKA: - Continue statin   Family Communication/Anticipated D/C date and plan/Code Status   DVT prophylaxis: Subcu heparin 3 times daily. Code Status: Full Code.  Family Communication:  None at bedside.  Remains inpatient appropriate because:Inpatient level of care appropriate due to severity of illness   Dispo: The patient is from: Home  Anticipated d/c is to: Home  Anticipated d/c date is: 05/31/2020  Patient currently is not medically stable to d/c due to ongoing treatment with broad-spectrum IV antibiotics, awaiting deep wound culture results and sensitivities.   Medical Consultants:    Orthopedic surgery weingold   Nephrology [place]   Anti-Infectives:    Vancomycin 6/23>>  Zosyn 6/23>>      Objective: Vitals:   05/28/20 1123 05/28/20 1232 05/28/20 1934 05/29/20 0530  BP: (!) 143/65 136/64 (!) 147/76 (!) 150/74  Pulse: 72 75 75 69  Resp: 10 15 16 16   Temp: 98.4 F (36.9 C)  98.2 F (36.8 C) 98.8 F (37.1 C)  98.1 F (36.7 C)  TempSrc: Oral Oral Oral Oral  SpO2:   100% 100%  Weight: 86.1 kg     Height:        Intake/Output Summary (Last 24 hours) at 05/29/2020 1245 Last data filed at 05/29/2020 0900 Gross per 24 hour  Intake 240 ml  Output --  Net 240 ml   Filed Weights   05/27/20 1145 05/28/20 0700 05/28/20 1123  Weight: 93.3 kg 89.9 kg 86.1 kg    Exam:  . General: 59 y.o. year-old male well-developed well-nourished no acute distress.  Alert oriented x3.   . Cardiovascular: Regular rate and rhythm no rubs or gallops.   Marland Kitchen Respiratory: Clear to auscultation no wheezes or rales. . Abdomen: Soft nontender nondistended normal bowel sounds present. . Musculoskeletal: Bilateral below the knee amputation.  Multiple fingers missing from the right hand in surgical dressing.   Marland Kitchen Psychiatry: Mood is appropriate for condition and setting.  Data Reviewed: CBC: Recent Labs  Lab 05/26/20 1315 05/27/20 0402 05/28/20 0352 05/29/20 0457  WBC 11.2* 10.5 9.2 8.1  NEUTROABS 8.0*  --   --   --   HGB 9.3* 8.8* 9.2* 8.9*  HCT 31.5* 30.1* 30.9* 30.9*  MCV 78.8* 77.6* 78.0* 78.4*  PLT 163 152 167 376   Basic Metabolic Panel: Recent Labs  Lab 05/26/20 1315 05/27/20 0402 05/28/20 0352 05/29/20 0457  NA 136 137 136 138  K 4.1 4.1 3.8 4.0  CL 93* 94* 91* 95*  CO2 26 25 28 27   GLUCOSE 110* 128* 153* 138*  BUN 48* 53* 31* 21  CREATININE 10.15* 11.04* 7.62* 5.74*  CALCIUM 9.2 8.6* 8.5* 9.4  MG 2.3  --   --   --   PHOS  --   --   --  5.7*   GFR: Estimated Creatinine Clearance: 16.5 mL/min (A) (by C-G formula based on SCr of 5.74 mg/dL (H)). Liver Function Tests: Recent Labs  Lab 05/26/20 1315 05/27/20 0402  AST 15 12*  ALT 17 13  ALKPHOS 70 62  BILITOT 1.0 1.1  PROT 8.0 7.5  ALBUMIN 3.0* 2.6*   No results for input(s): LIPASE, AMYLASE in the last 168 hours. No results for input(s): AMMONIA in the last 168 hours. Coagulation Profile: No results  for input(s): INR, PROTIME in the last 168 hours. Cardiac Enzymes: No results for input(s): CKTOTAL, CKMB, CKMBINDEX, TROPONINI in the last 168 hours. BNP (last 3 results) No results for input(s): PROBNP in the last 8760 hours. HbA1C: Recent Labs    05/26/20 1318  HGBA1C 5.3   CBG: Recent Labs  Lab 05/28/20 0632 05/28/20 1415 05/28/20 1601 05/28/20 2037 05/29/20 0742  GLUCAP 132* 161* 127* 121* 109*   Lipid Profile: No results for input(s): CHOL, HDL, LDLCALC, TRIG, CHOLHDL, LDLDIRECT in the last 72 hours. Thyroid Function Tests: No results for input(s): TSH, T4TOTAL, FREET4, T3FREE, THYROIDAB in the last 72 hours. Anemia Panel: Recent Labs    05/26/20 2203  FERRITIN 742*  TIBC 188*  IRON 12*   Urine analysis:    Component Value Date/Time   COLORURINE YELLOW 09/10/2018 1927   APPEARANCEUR HAZY (A) 09/10/2018 1927   LABSPEC 1.011 09/10/2018 1927   PHURINE 5.0 09/10/2018 1927   GLUCOSEU 50 (A) 09/10/2018 1927   HGBUR MODERATE (A) 09/10/2018 1927   BILIRUBINUR NEGATIVE 09/10/2018 1927   KETONESUR NEGATIVE 09/10/2018 1927   PROTEINUR 100 (A) 09/10/2018 1927   NITRITE NEGATIVE 09/10/2018 1927   LEUKOCYTESUR NEGATIVE 09/10/2018 1927  Sepsis Labs: @LABRCNTIP (procalcitonin:4,lacticidven:4)  ) Recent Results (from the past 240 hour(s))  SARS Coronavirus 2 by RT PCR (hospital order, performed in [facility] hospital lab) Nasopharyngeal Nasopharyngeal Swab     Status: None   Collection Time: 05/26/20 12:19 PM   Specimen: Nasopharyngeal Swab  Result Value Ref Range Status   SARS Coronavirus 2 NEGATIVE NEGATIVE Final    Comment: (NOTE) SARS-CoV-2 target nucleic acids are NOT DETECTED.  The SARS-CoV-2 RNA is generally detectable in upper and lower respiratory specimens during the acute phase of infection. The lowest concentration of SARS-CoV-2 viral copies this assay can detect is 250 copies / mL. A negative result does not preclude SARS-CoV-2 infection and should  not be used as the sole basis for treatment or other patient management decisions.  A negative result may occur with improper specimen collection / handling, submission of specimen other than nasopharyngeal swab, presence of viral mutation(s) within the areas targeted by this assay, and inadequate number of viral copies (<250 copies / mL). A negative result must be combined with clinical observations, patient history, and epidemiological information.  Fact Sheet for Patients:   StrictlyIdeas.no  Fact Sheet for Healthcare Providers: BankingDealers.co.za  This test is not yet approved or  cleared by the [place] FDA and has been authorized for detection and/or diagnosis of SARS-CoV-2 by FDA under an Emergency Use Authorization (EUA).  This EUA will remain in effect (meaning this test can be used) for the duration of the COVID-19 declaration under Section 564(b)(1) of the Act, 21 U.S.C. section 360bbb-3(b)(1), unless the authorization is terminated or revoked sooner.  Performed at [facility], [place] [address] [place], [place] [address]   Aerobic/Anaerobic Culture (surgical/deep wound)     Status: None   Collection Time: 05/26/20  5:33 PM   Specimen: PATH Other; Wound  Result Value Ref Range Status   Specimen Description HAND  Final   Special Requests RIGHT SAMPLE A  Final   Gram Stain   Final    MODERATE WBC PRESENT, PREDOMINANTLY PMN ABUNDANT GRAM NEGATIVE RODS    Culture   Final    ABUNDANT BACTEROIDES FRAGILIS BETA LACTAMASE POSITIVE WITH IN NORMAL SKIN FLORA Performed at [facility], [place] [address] [place], [place] [address]    Report Status 05/29/2020 FINAL  Final  Aerobic/Anaerobic Culture (surgical/deep wound)     Status: None (Preliminary result)   Collection Time: 05/26/20  6:07 PM   Specimen: PATH Bone biopsy; Tissue  Result Value Ref Range Status   Specimen Description TISSUE RIGHT HAND  Final    Special Requests BONE  Final   Gram Stain   Final    RARE WBC PRESENT, PREDOMINANTLY MONONUCLEAR NO ORGANISMS SEEN    Culture   Final    RARE ALCALIGENES FAECALIS FEW BACTEROIDES FRAGILIS BETA LACTAMASE POSITIVE Performed at [facility], [place] [address], [place], [place] [address]    Report Status PENDING  Incomplete  Culture, blood (routine x 2)     Status: None (Preliminary result)   Collection Time: 05/26/20 10:03 PM   Specimen: BLOOD LEFT HAND  Result Value Ref Range Status   Specimen Description BLOOD LEFT HAND  Final   Special Requests   Final    BOTTLES DRAWN AEROBIC AND ANAEROBIC Blood Culture adequate volume   Culture   Final    NO GROWTH 2 DAYS Performed at [facility], [place] [address] [place], [place] [address]    Report Status PENDING  Incomplete  Culture, blood (routine x 2)     Status: None (Preliminary result)   Collection Time: 05/26/20 10:09 PM   Specimen: BLOOD LEFT ARM  Result Value Ref Range Status   Specimen Description BLOOD LEFT ARM  Final   Special Requests   Final    BOTTLES DRAWN AEROBIC ONLY Blood Culture results may not be optimal due to an inadequate volume of blood received in culture bottles   Culture   Final    NO GROWTH 2 DAYS Performed at [facility], [name] [address] [place], [place] [address]    Report Status PENDING  Incomplete  MRSA PCR Screening     Status: None   Collection Time: 05/27/20  6:33 PM   Specimen: Nasopharyngeal  Result Value Ref Range Status   MRSA by PCR NEGATIVE NEGATIVE Final    Comment:        The GeneXpert MRSA Assay (FDA approved for NASAL specimens only), is one component of a comprehensive MRSA colonization surveillance program. It is not intended to diagnose MRSA infection nor to guide or monitor treatment for MRSA infections. Performed at [name], [place] [address], [place], [place] [address]       Studies: No results found.  Scheduled Meds: . atorvastatin  20 mg Oral  QHS  . calcitRIOL  2 mcg Oral Q M,W,F-HD  . carvedilol  12.5 mg Oral BID WC  . Chlorhexidine Gluconate Cloth  6 each Topical Q0600  . darbepoetin (ARANESP) injection - DIALYSIS  100 mcg Intravenous Q Fri-HD  . ferrous sulfate  325 mg Oral Q breakfast  . heparin injection (subcutaneous)  5,000 Units Subcutaneous Q8H  . insulin aspart  0-5 Units Subcutaneous QHS  . insulin aspart  0-6 Units Subcutaneous TID WC  . linagliptin  5 mg Oral Daily  . pregabalin  100 mg Oral QHS  . sevelamer carbonate  2,400 mg Oral TID WC    Continuous Infusions: . piperacillin-tazobactam (ZOSYN)  IV 2.25 g (05/29/20 0630)  . [START ON 05/31/2020] vancomycin       LOS: 3 days     Kayleen Memos, MD [facility] Pager [phone]  If 7PM-7AM, please contact night-coverage [email or web address] Password [facility] 05/29/2020, 12:45 PM

## 2020-05-29 NOTE — Progress Notes (Addendum)
[facility] Progress Note   Subjective:  Seen in room - no overnight issues. Did fine with HD yesterday -  2L net UF. No CP/dyspnea/hand pain at the moment.  Objective Vitals:   05/28/20 1123 05/28/20 1232 05/28/20 1934 05/29/20 0530  BP: (!) 143/65 136/64 (!) 147/76 (!) 150/74  Pulse: 72 75 75 69  Resp: 10 15 16 16   Temp: 98.4 F (36.9 C) 98.2 F (36.8 C) 98.8 F (37.1 C) 98.1 F (36.7 C)  TempSrc: Oral Oral Oral Oral  SpO2:   100% 100%  Weight: 86.1 kg     Height:       Physical Exam General: Well appearing man, NAD Heart: RRR; no murmur Lungs: CTAB Abdomen: soft, non tender Extremities: B BKA - no stump edema; R hand bandaged Dialysis Access: LUE AVF + bruit  Additional Objective Labs: Basic Metabolic Panel: Recent Labs  Lab 05/27/20 0402 05/28/20 0352 05/29/20 0457  NA 137 136 138  K 4.1 3.8 4.0  CL 94* 91* 95*  CO2 25 28 27   GLUCOSE 128* 153* 138*  BUN 53* 31* 21  CREATININE 11.04* 7.62* 5.74*  CALCIUM 8.6* 8.5* 9.4  PHOS  --   --  5.7*   Liver Function Tests: Recent Labs  Lab 05/26/20 1315 05/27/20 0402  AST 15 12*  ALT 17 13  ALKPHOS 70 62  BILITOT 1.0 1.1  PROT 8.0 7.5  ALBUMIN 3.0* 2.6*   CBC: Recent Labs  Lab 05/26/20 1315 05/26/20 1315 05/27/20 0402 05/28/20 0352 05/29/20 0457  WBC 11.2*   < > 10.5 9.2 8.1  NEUTROABS 8.0*  --   --   --   --   HGB 9.3*   < > 8.8* 9.2* 8.9*  HCT 31.5*   < > 30.1* 30.9* 30.9*  MCV 78.8*  --  77.6* 78.0* 78.4*  PLT 163   < > 152 167 188   < > = values in this interval not displayed.   Blood Culture    Component Value Date/Time   SDES BLOOD LEFT ARM 05/26/2020 2209   SPECREQUEST  05/26/2020 2209    BOTTLES DRAWN AEROBIC ONLY Blood Culture results may not be optimal due to an inadequate volume of blood received in culture bottles   CULT  05/26/2020 2209    NO GROWTH 2 DAYS Performed at [facility], [place] [address], [place], [place] [address]    REPTSTATUS PENDING  05/26/2020 2209   Iron Studies:  Recent Labs    05/26/20 2203  IRON 12*  TIBC 188*  FERRITIN 742*   Medications: . piperacillin-tazobactam (ZOSYN)  IV 2.25 g (05/29/20 0630)  . [START ON 05/31/2020] vancomycin     . atorvastatin  20 mg Oral QHS  . calcitRIOL  2 mcg Oral Q M,W,F-HD  . carvedilol  12.5 mg Oral BID WC  . Chlorhexidine Gluconate Cloth  6 each Topical Q0600  . darbepoetin (ARANESP) injection - DIALYSIS  100 mcg Intravenous Q Fri-HD  . ferrous sulfate  325 mg Oral Q breakfast  . heparin injection (subcutaneous)  5,000 Units Subcutaneous Q8H  . insulin aspart  0-5 Units Subcutaneous QHS  . insulin aspart  0-6 Units Subcutaneous TID WC  . linagliptin  5 mg Oral Daily  . pregabalin  100 mg Oral QHS  . sevelamer carbonate  2,400 mg Oral TID WC    [facility]: [facility] M[facility], 200NRe, BFR 425, DFR 800, EDW 90kg, 2K/2.5Ca, AVF 16g, no  heparin mircera 200mg  IV q 2 weeks- last dose 239mcg on 6/9 Venofer 100mg  q HD- received 0/10 doses  Calcitriol 2.12mcg PO q HD Renvela 800mg  4 tabs PO TID with meals Recent labs: 6/21 Hgb 9.8, Tsat 17%, Ferritin 642, Plt 216  Assessment/Plan: 1. R hand infection:S/p amputation of 2nd and 3rd fingersand debridement 6/23. On Vanc/Zosyn. WBC trending down. Management per ortho. 2. ESRD:Back to MWF schedule - next 6/28. 3. L arm erythema:Mild erythema and induration of the L arm proximal to AVF. Suspect this may be superficial thrombophlebitis from procedure vs some swelling residual from procedure or just augmentation of the bruit.Improved today.No swelling of the neck or lower arm and AVF working well.Getting IV abxwhich should cover any infection in the AVF. Continues to improve. 4. Hypertension/volume:BP controlled. No evidence of volume overload on exam. Getting below OP EDW - lower on d/c. 5. Anemia:Hgb8.9 - getting Aranesp 157mcg q Fri. Hold IV Fe for now due to infection. 6. Metabolic  bone disease:Ca ok, Phos borderline high. Cntinue calcitriol and renvela.  7. Nutrition:Renal diet/fluid restrictions.   Veneta Penton, PA-C 05/29/2020, 11:09 AM  [facility]  Patient seen and examined, agree with above note with above modifications. No c/o's today-  No issues with HD yest-  Minimal pain-  I think needing to stay to finalize abx choices.  Next HD due Monday  Corliss Parish, MD 05/29/2020

## 2020-05-29 NOTE — Plan of Care (Signed)
  Problem: Pain Managment: Goal: General experience of comfort will improve Outcome: Progressing   

## 2020-05-29 NOTE — Progress Notes (Signed)
Dr. Nevada Crane notified about pt refusing heparin. Pt stated both today and yesterday that he was educated in the past not to have heparin while in dialysis. Per Dr. Nevada Crane, heparin is okay and needed for DVT prevention. Pt re-educated and agreed.

## 2020-05-30 LAB — AEROBIC/ANAEROBIC CULTURE W GRAM STAIN (SURGICAL/DEEP WOUND)

## 2020-05-30 LAB — CBC
HCT: 32 % — ABNORMAL LOW (ref 39.0–52.0)
Hemoglobin: 9.4 g/dL — ABNORMAL LOW (ref 13.0–17.0)
MCH: 23.2 pg — ABNORMAL LOW (ref 26.0–34.0)
MCHC: 29.4 g/dL — ABNORMAL LOW (ref 30.0–36.0)
MCV: 79 fL — ABNORMAL LOW (ref 80.0–100.0)
Platelets: 221 10*3/uL (ref 150–400)
RBC: 4.05 MIL/uL — ABNORMAL LOW (ref 4.22–5.81)
RDW: 19.9 % — ABNORMAL HIGH (ref 11.5–15.5)
WBC: 8.2 10*3/uL (ref 4.0–10.5)
nRBC: 0 % (ref 0.0–0.2)

## 2020-05-30 LAB — BASIC METABOLIC PANEL
Anion gap: 17 — ABNORMAL HIGH (ref 5–15)
BUN: 35 mg/dL — ABNORMAL HIGH (ref 8–23)
CO2: 25 mmol/L (ref 22–32)
Calcium: 9.8 mg/dL (ref 8.9–10.3)
Chloride: 97 mmol/L — ABNORMAL LOW (ref 98–111)
Creatinine, Ser: 8.55 mg/dL — ABNORMAL HIGH (ref 0.61–1.24)
GFR calc Af Amer: 7 mL/min — ABNORMAL LOW (ref >60)
GFR calc non Af Amer: 6 mL/min — ABNORMAL LOW (ref >60)
Glucose, Bld: 114 mg/dL — ABNORMAL HIGH (ref 70–99)
Potassium: 4.3 mmol/L (ref 3.5–5.1)
Sodium: 139 mmol/L (ref 135–145)

## 2020-05-30 LAB — GLUCOSE, CAPILLARY
Glucose-Capillary: 143 mg/dL — ABNORMAL HIGH (ref 70–99)
Glucose-Capillary: 108 mg/dL — ABNORMAL HIGH (ref 70–99)
Glucose-Capillary: 101 mg/dL — ABNORMAL HIGH (ref 70–99)
Glucose-Capillary: 127 mg/dL — ABNORMAL HIGH (ref 70–99)

## 2020-05-30 MED ORDER — SODIUM CHLORIDE 0.9 % IV SOLN
500.0000 mg | Freq: Every day | INTRAVENOUS | Status: DC
  Administered 2020-05-30: 500 mg via INTRAVENOUS
  Filled 2020-05-30 (×2): qty 0.5

## 2020-05-30 MED ORDER — CHLORHEXIDINE GLUCONATE CLOTH 2 % EX PADS
6.0000 | MEDICATED_PAD | Freq: Every day | CUTANEOUS | Status: DC
  Administered 2020-05-31 – 2020-06-01 (×2): 6 via TOPICAL

## 2020-05-30 MED ORDER — NEPRO/CARBSTEADY PO LIQD
237.0000 mL | Freq: Two times a day (BID) | ORAL | Status: DC
  Administered 2020-05-31 – 2020-06-01 (×4): 237 mL via ORAL

## 2020-05-30 NOTE — Progress Notes (Addendum)
[name] Progress Note   Subjective:   Patient seen and examined at bedside.  Feeling well this AM.  Believes he is being d/c tomorrow. Denies CP, SOB, edema, and n/v/d.  Objective Vitals:   05/28/20 1934 05/29/20 0530 05/29/20 1936 05/30/20 0512  BP: (!) 147/76 (!) 150/74 (!) 188/90 (!) 153/79  Pulse: 75 69 72 71  Resp: 16 16 16 16   Temp: 98.8 F (37.1 C) 98.1 F (36.7 C) 98.6 F (37 C) 98.5 F (36.9 C)  TempSrc: Oral Oral Oral Oral  SpO2: 100% 100% 97% 93%  Weight:      Height:       Physical Exam General:Well appearing male in NAD Heart:RRR, no MRG Lungs:CTAB Abdomen:soft, NTND Extremities:b/l BKA, no edema. R hand bandaged Dialysis Access: LU AVF +b/t   Filed Weights   05/27/20 1145 05/28/20 0700 05/28/20 1123  Weight: 93.3 kg 89.9 kg 86.1 kg    Intake/Output Summary (Last 24 hours) at 05/30/2020 0925 Last data filed at 05/29/2020 1700 Gross per 24 hour  Intake 360 ml  Output --  Net 360 ml    Additional Objective Labs: Basic Metabolic Panel: Recent Labs  Lab 05/28/20 0352 05/29/20 0457 05/30/20 0454  NA 136 138 139  K 3.8 4.0 4.3  CL 91* 95* 97*  CO2 28 27 25   GLUCOSE 153* 138* 114*  BUN 31* 21 35*  CREATININE 7.62* 5.74* 8.55*  CALCIUM 8.5* 9.4 9.8  PHOS  --  5.7*  --    Liver Function Tests: Recent Labs  Lab 05/26/20 1315 05/27/20 0402  AST 15 12*  ALT 17 13  ALKPHOS 70 62  BILITOT 1.0 1.1  PROT 8.0 7.5  ALBUMIN 3.0* 2.6*   CBC: Recent Labs  Lab 05/26/20 1315 05/26/20 1315 05/27/20 0402 05/27/20 0402 05/28/20 0352 05/29/20 0457 05/30/20 0454  WBC 11.2*   < > 10.5   < > 9.2 8.1 8.2  NEUTROABS 8.0*  --   --   --   --   --   --   HGB 9.3*   < > 8.8*   < > 9.2* 8.9* 9.4*  HCT 31.5*   < > 30.1*   < > 30.9* 30.9* 32.0*  MCV 78.8*  --  77.6*  --  78.0* 78.4* 79.0*  PLT 163   < > 152   < > 167 188 221   < > = values in this interval not displayed.   Blood Culture    Component Value Date/Time   SDES BLOOD  LEFT ARM 05/26/2020 2209   SPECREQUEST  05/26/2020 2209    BOTTLES DRAWN AEROBIC ONLY Blood Culture results may not be optimal due to an inadequate volume of blood received in culture bottles   CULT  05/26/2020 2209    NO GROWTH 3 DAYS Performed at [facility], [place] [address], [place], [place] [address]    REPTSTATUS PENDING 05/26/2020 2209    CBG: Recent Labs  Lab 05/29/20 0742 05/29/20 1206 05/29/20 1631 05/29/20 2058 05/30/20 0801  GLUCAP 109* 148* 152* 176* 143*    Medications: . piperacillin-tazobactam (ZOSYN)  IV 2.25 g (05/30/20 0505)  . [START ON 05/31/2020] vancomycin     . atorvastatin  20 mg Oral QHS  . calcitRIOL  2 mcg Oral Q M,W,F-HD  . carvedilol  12.5 mg Oral BID WC  . Chlorhexidine Gluconate Cloth  6 each Topical Q0600  . darbepoetin (ARANESP) injection - DIALYSIS  100 mcg Intravenous Q Fri-HD  .  ferrous sulfate  325 mg Oral Q breakfast  . heparin injection (subcutaneous)  5,000 Units Subcutaneous Q8H  . linagliptin  5 mg Oral Daily  . pregabalin  100 mg Oral QHS  . sevelamer carbonate  2,400 mg Oral TID WC    [facility]: [facility] MWF, 200NRe, BFR 425, DFR 800, EDW 90kg, 2K/2.5Ca, AVF 16g, no heparin mircera 200mg  IV q 2 weeks- last dose 233mcg on 6/9 Venofer 100mg  q HD- received 0/10 doses  Calcitriol 2.55mcg PO q HD Renvela 800mg  4 tabs PO TID with meals Recent labs: 6/21 Hgb 9.8, Tsat 17%, Ferritin 642, Plt 216  Assessment/Plan: 1. R hand infection:S/p amputation of 2nd and 3rd fingersand debridement 6/23. On Vanc/Zosyn.WBC trending down.Afebrile. WC +bacteroides fragilis & alcaligenes faecalis, sensitivities pending. Management per ortho. 2. ESRD:MWF. Off schedule last week.  Resume regular schedule tomorrow. Orders written.  3. L arm erythema:Mild erythema and induration of the L arm proximal to AVF. Suspect this may be superficial thrombophlebitis from procedure vs some swelling residual from  procedure or just augmentation of the bruit.Improved today.No swelling of the neck or lower arm and AVF working well.Getting IV abxwhich should cover any infection in the AVF.Continues to improve. 4. Hypertension/volume:BPvariable. On carvedilol 12.5mg  BID. Does not appear volume overloaded. Getting below OP EDW - lower on d/c.  Post weight Friday 86.1kg. 5. Anemia:Hgb^9.4 - getting Aranesp 180mcg q Fri. Hold IV Fe for now due to infection. 6. Metabolic bone disease:Ca ok, Phos borderline high. Continue calcitriol and renvela.  7. Nutrition:Renal diet/fluid restrictions. Vit. Protein supplements.   Jen Mow, PA-C [facility] Pager: [phone] 05/30/2020,9:25 AM  LOS: 4 days    Patient seen and examined, agree with above note with above modifications. No new c/o's-  Still trying to determine home abx regimen from what I can tell. At dialysis , we can, if relevent, give ancef, fortaz , cefepime and vanc as well as gent or tobra.  Please let us know if we can be of service with abx admin-  Regular HD tomorrow Corliss Parish, MD 05/30/2020
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
## 2020-05-30 NOTE — Progress Notes (Signed)
PROGRESS NOTE  [name] TOI:[id number] DOB: Mar 17, 1961 DOA: 05/26/2020 PCP: [name], FNP  HPI/Recap of past 24 hours: [name] is an 59 y.o. male with a past medical history that includes end-stage renal disease a Monday Wednesday Friday dialysis schedule, hypertension, peripheral vascular disease status post bilateral below-the-knee amputation, diabetes, June 23 for incision and debridement of right middle and ring finger surgical wound (fingers amputated 5/22) per ortho. I and D done 6/23 and patient dialyzed 6/24 as he missed regular dialysis. Post op pain difficult to control.   Hemodialyzed on 05/27/2020 and 05/28/2020.  On IV antibiotics empirically IV vancomycin and Zosyn.  Deep wound culture showing moderate WBC present, abundant gram-negative rods, holding for possible anaerobe.  Continue to follow deep wound cultures for ID and sensitivities.   Deep wound culture taken on 05/26/2020 reveals abundant Bacteroides fragilis, beta-lactamase positive, rare Alcaligenes Faecalis.  Awaiting sensitivities.  Continue broad-spectrum IV antibiotics.  05/30/20: Seen and examined.  No acute events overnight.  Following deep wound cultures, growing multiorganisms.  Infectious disease has been consulted for antibiotics recommendation and duration.       Assessment/Plan: Principal Problem:   Infection of right hand Active Problems:   HTN (hypertension)   End stage renal disease on dialysis ([place])   Type 2 diabetes mellitus (HCC)   HLD (hyperlipidemia)   PVD (peripheral vascular disease) (HCC)   GERD (gastroesophageal reflux disease)   Neuropathic pain   Diabetic neuropathy (HCC)  Bacteroides fragilis, beta-lactamase positive, Alcaligenes Faecalis Right hand infection: Patient has history of right index, middle and ring finger amputation2/2 to ischemic changes. s/p irrigation and debridement of right hand 6/23 by Dr.Weingold.  Deep wound culture taken on 05/26/2020  reveals abundant Bacteroides fragilis, beta-lactamase positive.  Gram stain revealed abundant gram-negative rods, final.  Also revealed rare Alcaligenes Faecalis.  Awaiting sensitivities.  Afebrile with no leukocytosis Continue broad-spectrum IV antibiotics vancomycin and Zosyn. Continue pain control. Infectious disease consult for antibiotics recommendation and duration, appreciate assistance.  ESRD on hemodialysis Monday, Wednesday and Friday.  Appreciate nephrology assistance.  Dialysis on 05/27/2020 and 05/28/2020.              Next hemodialysis 05/31/2020 -Continue Renvela and calcium carbonate  Anemia of chronic disease/iron deficiency anemia Continue ferrous sulfate Hemoglobin stable 9.2 from 8.8, MCV 78. No overt bleeding. Aranesp every Friday.  Hypertension:  Blood pressure is at goal.   -Continue Coreg.  -Continue to monitor blood pressure closely  Type II diabetes mellitus, well controlled:  home meds include Tradjenta, and lantus.  Hemoglobin A1c 5.3 Continue insulin sliding scale Avoid hypoglycemia  Diabetic neuropathy: - Continue Lyrica  Severe PVD status post bilateral BKA: - Continue statin   Family Communication/Anticipated D/C date and plan/Code Status   DVT prophylaxis: Subcu heparin 3 times daily. Code Status: Full Code.  Family Communication:  None at bedside.  Remains inpatient appropriate because:Inpatient level of care appropriate due to severity of illness   Dispo: The patient is from: Home  Anticipated d/c is to: Home  Anticipated d/c date is: 05/31/2020  Patient currently is not medically stable to d/c due to ongoing treatment with broad-spectrum IV antibiotics, awaiting deep wound culture results and sensitivities.   Medical Consultants:    Orthopedic surgery Dr. Burney Gauze   Nephrology Dr. Moshe Cipro  Infectious disease Dr. Graylon Good   Anti-Infectives:    Vancomycin 6/23>>  Zosyn  6/23>>      Objective: Vitals:   05/28/20 1934 05/29/20 0530 05/29/20 1936  05/30/20 0512  BP: (!) 147/76 (!) 150/74 (!) 188/90 (!) 153/79  Pulse: 75 69 72 71  Resp: 16 16 16 16   Temp: 98.8 F (37.1 C) 98.1 F (36.7 C) 98.6 F (37 C) 98.5 F (36.9 C)  TempSrc: Oral Oral Oral Oral  SpO2: 100% 100% 97% 93%  Weight:      Height:        Intake/Output Summary (Last 24 hours) at 05/30/2020 1235 Last data filed at 05/30/2020 0900 Gross per 24 hour  Intake 720 ml  Output --  Net 720 ml   Filed Weights   05/27/20 1145 05/28/20 0700 05/28/20 1123  Weight: 93.3 kg 89.9 kg 86.1 kg    Exam:  . General: 59 y.o. year-old male well-developed well-nourished in no acute distress.  Alert oriented x3.   . Cardiovascular: Regular rate and rhythm no rubs or gallops.   Marland Kitchen Respiratory: Clear to auscultation no wheezes or rales.   . Abdomen: Soft nontender normal bowel sounds . Musculoskeletal: Bilateral below the knee amputation.  Multiple fingers missing from the right hand in surgical dressing.   Marland Kitchen Psychiatry: Mood is appropriate for condition and setting.   Data Reviewed: CBC: Recent Labs  Lab 05/26/20 1315 05/27/20 0402 05/28/20 0352 05/29/20 0457 05/30/20 0454  WBC 11.2* 10.5 9.2 8.1 8.2  NEUTROABS 8.0*  --   --   --   --   HGB 9.3* 8.8* 9.2* 8.9* 9.4*  HCT 31.5* 30.1* 30.9* 30.9* 32.0*  MCV 78.8* 77.6* 78.0* 78.4* 79.0*  PLT 163 152 167 188 644   Basic Metabolic Panel: Recent Labs  Lab 05/26/20 1315 05/27/20 0402 05/28/20 0352 05/29/20 0457 05/30/20 0454  NA 136 137 136 138 139  K 4.1 4.1 3.8 4.0 4.3  CL 93* 94* 91* 95* 97*  CO2 26 25 28 27 25   GLUCOSE 110* 128* 153* 138* 114*  BUN 48* 53* 31* 21 35*  CREATININE 10.15* 11.04* 7.62* 5.74* 8.55*  CALCIUM 9.2 8.6* 8.5* 9.4 9.8  MG 2.3  --   --   --   --   PHOS  --   --   --  5.7*  --    GFR: Estimated Creatinine Clearance: 11 mL/min (A) (by C-G formula based on SCr of 8.55 mg/dL (H)). Liver Function  Tests: Recent Labs  Lab 05/26/20 1315 05/27/20 0402  AST 15 12*  ALT 17 13  ALKPHOS 70 62  BILITOT 1.0 1.1  PROT 8.0 7.5  ALBUMIN 3.0* 2.6*   No results for input(s): LIPASE, AMYLASE in the last 168 hours. No results for input(s): AMMONIA in the last 168 hours. Coagulation Profile: No results for input(s): INR, PROTIME in the last 168 hours. Cardiac Enzymes: No results for input(s): CKTOTAL, CKMB, CKMBINDEX, TROPONINI in the last 168 hours. BNP (last 3 results) No results for input(s): PROBNP in the last 8760 hours. HbA1C: No results for input(s): HGBA1C in the last 72 hours. CBG: Recent Labs  Lab 05/29/20 1206 05/29/20 1631 05/29/20 2058 05/30/20 0801 05/30/20 1150  GLUCAP 148* 152* 176* 143* 108*   Lipid Profile: No results for input(s): CHOL, HDL, LDLCALC, TRIG, CHOLHDL, LDLDIRECT in the last 72 hours. Thyroid Function Tests: No results for input(s): TSH, T4TOTAL, FREET4, T3FREE, THYROIDAB in the last 72 hours. Anemia Panel: No results for input(s): VITAMINB12, FOLATE, FERRITIN, TIBC, IRON, RETICCTPCT in the last 72 hours. Urine analysis:    Component Value Date/Time   COLORURINE YELLOW 09/10/2018 1927   APPEARANCEUR HAZY (A) 09/10/2018  1927   LABSPEC 1.011 09/10/2018 1927   PHURINE 5.0 09/10/2018 1927   GLUCOSEU 50 (A) 09/10/2018 1927   HGBUR MODERATE (A) 09/10/2018 1927   BILIRUBINUR NEGATIVE 09/10/2018 1927   KETONESUR NEGATIVE 09/10/2018 1927   PROTEINUR 100 (A) 09/10/2018 1927   NITRITE NEGATIVE 09/10/2018 1927   LEUKOCYTESUR NEGATIVE 09/10/2018 1927   Sepsis Labs: @LABRCNTIP (procalcitonin:4,lacticidven:4)  ) Recent Results (from the past 240 hour(s))  SARS Coronavirus 2 by RT PCR (hospital order, performed in [facility] hospital lab) Nasopharyngeal Nasopharyngeal Swab     Status: None   Collection Time: 05/26/20 12:19 PM   Specimen: Nasopharyngeal Swab  Result Value Ref Range Status   SARS Coronavirus 2 NEGATIVE NEGATIVE Final    Comment:  (NOTE) SARS-CoV-2 target nucleic acids are NOT DETECTED.  The SARS-CoV-2 RNA is generally detectable in upper and lower respiratory specimens during the acute phase of infection. The lowest concentration of SARS-CoV-2 viral copies this assay can detect is 250 copies / mL. A negative result does not preclude SARS-CoV-2 infection and should not be used as the sole basis for treatment or other patient management decisions.  A negative result may occur with improper specimen collection / handling, submission of specimen other than nasopharyngeal swab, presence of viral mutation(s) within the areas targeted by this assay, and inadequate number of viral copies (<250 copies / mL). A negative result must be combined with clinical observations, patient history, and epidemiological information.  Fact Sheet for Patients:   StrictlyIdeas.no  Fact Sheet for Healthcare Providers: BankingDealers.co.za  This test is not yet approved or  cleared by the [place] FDA and has been authorized for detection and/or diagnosis of SARS-CoV-2 by FDA under an Emergency Use Authorization (EUA).  This EUA will remain in effect (meaning this test can be used) for the duration of the COVID-19 declaration under Section 564(b)(1) of the Act, 21 U.S.C. section 360bbb-3(b)(1), unless the authorization is terminated or revoked sooner.  Performed at [facility], [place] [address] [place], [place] [address]   Aerobic/Anaerobic Culture (surgical/deep wound)     Status: None   Collection Time: 05/26/20  5:33 PM   Specimen: PATH Other; Wound  Result Value Ref Range Status   Specimen Description HAND  Final   Special Requests RIGHT SAMPLE A  Final   Gram Stain   Final    MODERATE WBC PRESENT, PREDOMINANTLY PMN ABUNDANT GRAM NEGATIVE RODS    Culture   Final    ABUNDANT BACTEROIDES FRAGILIS BETA LACTAMASE POSITIVE WITH IN NORMAL SKIN FLORA Performed at [facility], [place] [address], [place], [place] [address]    Report Status 05/29/2020 FINAL  Final  Aerobic/Anaerobic Culture (surgical/deep wound)     Status: None   Collection Time: 05/26/20  6:07 PM   Specimen: PATH Bone biopsy; Tissue  Result Value Ref Range Status   Specimen Description TISSUE RIGHT HAND  Final   Special Requests BONE  Final   Gram Stain   Final    RARE WBC PRESENT, PREDOMINANTLY MONONUCLEAR NO ORGANISMS SEEN    Culture   Final    RARE ALCALIGENES FAECALIS FEW BACTEROIDES FRAGILIS BETA LACTAMASE POSITIVE Performed at [facility], [place] [address] [place],  [address]    Report Status 05/30/2020 FINAL  Final   Organism ID, Bacteria ALCALIGENES FAECALIS  Final      Susceptibility   Alcaligenes faecalis - MIC*    CEFAZOLIN 32 INTERMEDIATE Intermediate     GENTAMICIN 2  SENSITIVE Sensitive     CIPROFLOXACIN 2 INTERMEDIATE Intermediate     IMIPENEM 0.5 SENSITIVE Sensitive     TRIMETH/SULFA <=20 SENSITIVE Sensitive     * RARE ALCALIGENES FAECALIS  Culture, blood (routine x 2)     Status: None (Preliminary result)   Collection Time: 05/26/20 10:03 PM   Specimen: BLOOD LEFT HAND  Result Value Ref Range Status   Specimen Description BLOOD LEFT HAND  Final   Special Requests   Final    BOTTLES DRAWN AEROBIC AND ANAEROBIC Blood Culture adequate volume   Culture   Final    NO GROWTH 3 DAYS Performed at [facility], [place] [address], [place], [place] [address]    Report Status PENDING  Incomplete  Culture, blood (routine x 2)     Status: None (Preliminary result)   Collection Time: 05/26/20 10:09 PM   Specimen: BLOOD LEFT ARM  Result Value Ref Range Status   Specimen Description BLOOD LEFT ARM  Final   Special Requests   Final    BOTTLES DRAWN AEROBIC ONLY Blood Culture results may not be optimal due to an inadequate volume of blood received in culture bottles   Culture   Final    NO GROWTH 3 DAYS Performed at [facility], [place]  [address], [place], [place] [address]    Report Status PENDING  Incomplete  MRSA PCR Screening     Status: None   Collection Time: 05/27/20  6:33 PM   Specimen: Nasopharyngeal  Result Value Ref Range Status   MRSA by PCR NEGATIVE NEGATIVE Final    Comment:        The GeneXpert MRSA Assay (FDA approved for NASAL specimens only), is one component of a comprehensive MRSA colonization surveillance program. It is not intended to diagnose MRSA infection nor to guide or monitor treatment for MRSA infections. Performed at [facility], [place] [address] [place], [place] [address]       Studies: No results found.  Scheduled Meds: . atorvastatin  20 mg Oral QHS  . calcitRIOL  2 mcg Oral Q M,W,F-HD  . carvedilol  12.5 mg Oral BID WC  . Chlorhexidine Gluconate Cloth  6 each Topical Q0600  . Chlorhexidine Gluconate Cloth  6 each Topical Q0600  . darbepoetin (ARANESP) injection - DIALYSIS  100 mcg Intravenous Q Fri-HD  . feeding supplement (NEPRO CARB STEADY)  237 mL Oral BID BM  . ferrous sulfate  325 mg Oral Q breakfast  . heparin injection (subcutaneous)  5,000 Units Subcutaneous Q8H  . linagliptin  5 mg Oral Daily  . pregabalin  100 mg Oral QHS  . sevelamer carbonate  2,400 mg Oral TID WC    Continuous Infusions: . piperacillin-tazobactam (ZOSYN)  IV 2.25 g (05/30/20 0505)  . [START ON 05/31/2020] vancomycin       LOS: 4 days     Kayleen Memos, MD [facility] Pager [phone]  If 7PM-7AM, please contact night-coverage [email or web address] Password [facility] 05/30/2020, 12:35 PM

## 2020-05-31 LAB — BASIC METABOLIC PANEL
Anion gap: 18 — ABNORMAL HIGH (ref 5–15)
BUN: 52 mg/dL — ABNORMAL HIGH (ref 8–23)
CO2: 23 mmol/L (ref 22–32)
Calcium: 9.8 mg/dL (ref 8.9–10.3)
Chloride: 96 mmol/L — ABNORMAL LOW (ref 98–111)
Creatinine, Ser: 9.94 mg/dL — ABNORMAL HIGH (ref 0.61–1.24)
GFR calc Af Amer: 6 mL/min — ABNORMAL LOW (ref >60)
GFR calc non Af Amer: 5 mL/min — ABNORMAL LOW (ref >60)
Glucose, Bld: 105 mg/dL — ABNORMAL HIGH (ref 70–99)
Potassium: 4.5 mmol/L (ref 3.5–5.1)
Sodium: 137 mmol/L (ref 135–145)

## 2020-05-31 LAB — GLUCOSE, CAPILLARY
Glucose-Capillary: 113 mg/dL — ABNORMAL HIGH (ref 70–99)
Glucose-Capillary: 131 mg/dL — ABNORMAL HIGH (ref 70–99)
Glucose-Capillary: 113 mg/dL — ABNORMAL HIGH (ref 70–99)

## 2020-05-31 LAB — C-REACTIVE PROTEIN: CRP: 7.3 mg/dL — ABNORMAL HIGH (ref <1.0)

## 2020-05-31 LAB — CBC
HCT: 30.5 % — ABNORMAL LOW (ref 39.0–52.0)
Hemoglobin: 8.9 g/dL — ABNORMAL LOW (ref 13.0–17.0)
MCH: 22.6 pg — ABNORMAL LOW (ref 26.0–34.0)
MCHC: 29.2 g/dL — ABNORMAL LOW (ref 30.0–36.0)
MCV: 77.4 fL — ABNORMAL LOW (ref 80.0–100.0)
Platelets: 261 10*3/uL (ref 150–400)
RBC: 3.94 MIL/uL — ABNORMAL LOW (ref 4.22–5.81)
RDW: 20 % — ABNORMAL HIGH (ref 11.5–15.5)
WBC: 9 10*3/uL (ref 4.0–10.5)
nRBC: 0 % (ref 0.0–0.2)

## 2020-05-31 LAB — SEDIMENTATION RATE: Sed Rate: 98 mm/hr — ABNORMAL HIGH (ref 0–16)

## 2020-05-31 MED ORDER — CARVEDILOL 12.5 MG PO TABS: 12.5000 mg | ORAL_TABLET | ORAL | Status: DC

## 2020-05-31 MED ORDER — OXYCODONE-ACETAMINOPHEN 5-325 MG PO TABS
ORAL_TABLET | ORAL | Status: AC
  Administered 2020-05-31: 1.5
  Filled 2020-05-31: qty 2

## 2020-05-31 MED ORDER — CALCITRIOL 0.5 MCG PO CAPS
ORAL_CAPSULE | ORAL | Status: AC
  Filled 2020-05-31: qty 4

## 2020-05-31 MED ORDER — SACCHAROMYCES BOULARDII 250 MG PO CAPS
250.0000 mg | ORAL_CAPSULE | Freq: Two times a day (BID) | ORAL | Status: DC
  Administered 2020-05-31 – 2020-06-01 (×2): 250 mg via ORAL
  Filled 2020-05-31 (×2): qty 1

## 2020-05-31 MED ORDER — CARVEDILOL 12.5 MG PO TABS: 12.5000 mg | ORAL_TABLET | Freq: Once | ORAL | Status: DC

## 2020-05-31 MED ORDER — CARVEDILOL 12.5 MG PO TABS
12.5000 mg | ORAL_TABLET | ORAL | Status: DC
  Administered 2020-05-31: 12.5 mg via ORAL

## 2020-05-31 MED ORDER — SODIUM CHLORIDE 0.9 % IV SOLN
500.0000 mg | INTRAVENOUS | Status: DC
  Administered 2020-06-01: 500 mg via INTRAVENOUS
  Filled 2020-05-31 (×3): qty 0.5

## 2020-05-31 NOTE — Progress Notes (Signed)
Attending MD has been made aware of pt's current SBP in 190. Pt remains alert/oriented in no apparent distress. No complaints of chest pain/palpitations /dizziness or any discomfort at this time.Pt has hx of HTN. No complalnts voiced. Still awaiting a response from MD

## 2020-05-31 NOTE — Progress Notes (Signed)
PHARMACY CONSULT NOTE FOR:  OUTPATIENT  PARENTERAL ANTIBIOTIC THERAPY (OPAT)  Indication: Right hand infection Regimen: Ertapenem 500 mg IV Q 24 hours  End date:  07/06/2020  IV antibiotic discharge orders are pended. To discharging provider:  please sign these orders via discharge navigator,  Select New Orders & click on the button choice - Manage This Unsigned Work.     Thank you for allowing pharmacy to be a part of this patient's care.  Jimmy Footman, PharmD, BCPS, BCIDP Infectious Diseases Clinical Pharmacist Phone: [phone] 05/31/2020, 1:18 PM

## 2020-05-31 NOTE — Progress Notes (Signed)
PROGRESS NOTE  [name]:[id number] DOB: [birth year] DOA: 05/26/2020 PCP: [name], FNP  HPI/Recap of past 24 hours: [name] is an 59 y.o. male with a past medical history that includes end-stage renal disease a Monday Wednesday Friday, hypertension, peripheral vascular disease status post bilateral below-the-knee amputation, types 2 diabetes, on presented on June 23, 21 for incision and debridement of right middle and ring finger surgical wound (fingers amputated 04/24/20) per ortho. I and D completed on 6/23 and patient dialyzed 6/24 as he missed his last hemodialysis appointment.   Hemodialyzed on 05/27/2020 and 05/28/2020.  On IV antibiotics empirically IV vancomycin and Zosyn.  Deep wound culture with polymicrobial growth.  ID was consulted to assist with the management.  05/31/20: Seen and examined.  No acute events overnight.  Following deep wound cultures, growing multiorganisms.  Infectious disease following, appreciate recommendations    Assessment/Plan: Principal Problem:   Infection of right hand Active Problems:   HTN (hypertension)   End stage renal disease on dialysis ([place])   Type 2 diabetes mellitus (HCC)   HLD (hyperlipidemia)   PVD (peripheral vascular disease) (HCC)   GERD (gastroesophageal reflux disease)   Neuropathic pain   Diabetic neuropathy (HCC)  Polymicrobial right hand osteomyelitis  Patient has history of right index, middle and ring finger amputation2/2 to ischemic changes. s/p irrigation and debridement of right hand 6/23 by Dr.Weingold.  Deep wound culture taken on 05/26/2020 reveals abundant Bacteroides fragilis, beta-lactamase positive.  Gram stain revealed abundant gram-negative rods, final.  Also revealed rare Alcaligenes Faecalis.  Seen by infectious disease, switched to meropenem on 06/01/2019, will need 6 weeks of IV antibiotics.   Interventional radiology consulted for possible central line placement.   CRP 7.3, sed rate  98.  ESRD on hemodialysis Monday, Wednesday and Friday.  Appreciate nephrology assistance.  Dialysis on 05/27/2020 and 05/28/2020.         Planned hemodialysis 05/31/2020 -Continue Renvela and calcium carbonate  Anemia of chronic disease/iron deficiency anemia Continue ferrous sulfate Hemoglobin 8.9 from 9.2 No overt bleeding. Aranesp every Friday.  Hypertension:  Blood pressure is at goal.   -Continue Coreg.  -Continue to monitor blood pressure closely  Type II diabetes mellitus, well controlled:  home meds include Tradjenta, and lantus.  Hold off Lantus and continue Tradjenta to avoid hypoglycemia Hemoglobin A1c 5.3 Follow-up with your PCP  Diabetic neuropathy: - Continue Lyrica  Severe PVD status post bilateral BKA: - Continue statin   Family Communication/Anticipated D/C date and plan/Code Status   DVT prophylaxis: Subcu heparin 3 times daily. Code Status: Full Code.  Family Communication:  None at bedside.  Remains inpatient appropriate because:Inpatient level of care appropriate due to severity of illness   Dispo: The patient is from: Home  Anticipated d/c is to: Home  Anticipated d/c date is: 05/31/2020  Patient currently is not medically stable to d/c due to ongoing treatment with broad-spectrum IV antibiotics, awaiting deep wound culture results and sensitivities.   Medical Consultants:    Orthopedic surgery Dr. Burney Gauze   Nephrology Dr. Moshe Cipro  Infectious disease Dr. Graylon Good   Anti-Infectives:    Vancomycin 6/23>>05/30/20  Zosyn 6/23>>05/30/20  Meropenem 05/30/2020>> x6 weeks, end date      Objective: Vitals:   05/30/20 2010 05/31/20 0418 05/31/20 0754 05/31/20 0756  BP: (!) 177/85 (!) 199/95 (!) 184/86 (!) 169/76  Pulse: 68 68 69 68  Resp: 16 16    Temp: 98 F (36.7 C) 97.7 F (36.5 C) 98.6  F (37 C)   TempSrc: Oral Oral Oral   SpO2: 100% 99% 93% 90%  Weight:      Height:         Intake/Output Summary (Last 24 hours) at 05/31/2020 1251 Last data filed at 05/31/2020 0500 Gross per 24 hour  Intake --  Output 0 ml  Net 0 ml   Filed Weights   05/27/20 1145 05/28/20 0700 05/28/20 1123  Weight: 93.3 kg 89.9 kg 86.1 kg    Exam:  . General: 59 y.o. year-old male well-developed well-nourished in no acute distress.  Alert and oriented x3.   . Cardiovascular: Regular rate and rhythm no rubs or gallops.   Marland Kitchen Respiratory: Clear to auscultation no wheezes or rales. . Abdomen: Soft nontender normal bowel sounds present.   . Musculoskeletal: Bilateral below the knee amputation.  Multiple fingers missing from the right hand in surgical dressing.   Marland Kitchen Psychiatry: Mood is appropriate for condition and setting.   Data Reviewed: CBC: Recent Labs  Lab 05/26/20 1315 05/26/20 1315 05/27/20 0402 05/28/20 0352 05/29/20 0457 05/30/20 0454 05/31/20 0458  WBC 11.2*   < > 10.5 9.2 8.1 8.2 9.0  NEUTROABS 8.0*  --   --   --   --   --   --   HGB 9.3*   < > 8.8* 9.2* 8.9* 9.4* 8.9*  HCT 31.5*   < > 30.1* 30.9* 30.9* 32.0* 30.5*  MCV 78.8*   < > 77.6* 78.0* 78.4* 79.0* 77.4*  PLT 163   < > 152 167 188 221 261   < > = values in this interval not displayed.   Basic Metabolic Panel: Recent Labs  Lab 05/26/20 1315 05/26/20 1315 05/27/20 0402 05/28/20 0352 05/29/20 0457 05/30/20 0454 05/31/20 0458  NA 136   < > 137 136 138 139 137  K 4.1   < > 4.1 3.8 4.0 4.3 4.5  CL 93*   < > 94* 91* 95* 97* 96*  CO2 26   < > 25 28 27 25 23   GLUCOSE 110*   < > 128* 153* 138* 114* 105*  BUN 48*   < > 53* 31* 21 35* 52*  CREATININE 10.15*   < > 11.04* 7.62* 5.74* 8.55* 9.94*  CALCIUM 9.2   < > 8.6* 8.5* 9.4 9.8 9.8  MG 2.3  --   --   --   --   --   --   PHOS  --   --   --   --  5.7*  --   --    < > = values in this interval not displayed.   GFR: Estimated Creatinine Clearance: 9.5 mL/min (A) (by C-G formula based on SCr of 9.94 mg/dL (H)). Liver Function Tests: Recent Labs  Lab  05/26/20 1315 05/27/20 0402  AST 15 12*  ALT 17 13  ALKPHOS 70 62  BILITOT 1.0 1.1  PROT 8.0 7.5  ALBUMIN 3.0* 2.6*   No results for input(s): LIPASE, AMYLASE in the last 168 hours. No results for input(s): AMMONIA in the last 168 hours. Coagulation Profile: No results for input(s): INR, PROTIME in the last 168 hours. Cardiac Enzymes: No results for input(s): CKTOTAL, CKMB, CKMBINDEX, TROPONINI in the last 168 hours. BNP (last 3 results) No results for input(s): PROBNP in the last 8760 hours. HbA1C: No results for input(s): HGBA1C in the last 72 hours. CBG: Recent Labs  Lab 05/30/20 1150 05/30/20 1606 05/30/20 2040 05/31/20 0751 05/31/20 1147  GLUCAP  108* 101* 127* 113* 131*   Lipid Profile: No results for input(s): CHOL, HDL, LDLCALC, TRIG, CHOLHDL, LDLDIRECT in the last 72 hours. Thyroid Function Tests: No results for input(s): TSH, T4TOTAL, FREET4, T3FREE, THYROIDAB in the last 72 hours. Anemia Panel: No results for input(s): VITAMINB12, FOLATE, FERRITIN, TIBC, IRON, RETICCTPCT in the last 72 hours. Urine analysis:    Component Value Date/Time   COLORURINE YELLOW 09/10/2018 1927   APPEARANCEUR HAZY (A) 09/10/2018 1927   LABSPEC 1.011 09/10/2018 1927   PHURINE 5.0 09/10/2018 1927   GLUCOSEU 50 (A) 09/10/2018 1927   HGBUR MODERATE (A) 09/10/2018 1927   BILIRUBINUR NEGATIVE 09/10/2018 1927   KETONESUR NEGATIVE 09/10/2018 1927   PROTEINUR 100 (A) 09/10/2018 1927   NITRITE NEGATIVE 09/10/2018 1927   LEUKOCYTESUR NEGATIVE 09/10/2018 1927   Sepsis Labs: @LABRCNTIP (procalcitonin:4,lacticidven:4)  ) Recent Results (from the past 240 hour(s))  SARS Coronavirus 2 by RT PCR (hospital order, performed in [place] hospital lab) Nasopharyngeal Nasopharyngeal Swab     Status: None   Collection Time: 05/26/20 12:19 PM   Specimen: Nasopharyngeal Swab  Result Value Ref Range Status   SARS Coronavirus 2 NEGATIVE NEGATIVE Final    Comment: (NOTE) SARS-CoV-2 target  nucleic acids are NOT DETECTED.  The SARS-CoV-2 RNA is generally detectable in upper and lower respiratory specimens during the acute phase of infection. The lowest concentration of SARS-CoV-2 viral copies this assay can detect is 250 copies / mL. A negative result does not preclude SARS-CoV-2 infection and should not be used as the sole basis for treatment or other patient management decisions.  A negative result may occur with improper specimen collection / handling, submission of specimen other than nasopharyngeal swab, presence of viral mutation(s) within the areas targeted by this assay, and inadequate number of viral copies (<250 copies / mL). A negative result must be combined with clinical observations, patient history, and epidemiological information.  Fact Sheet for Patients:   StrictlyIdeas.no  Fact Sheet for Healthcare Providers: BankingDealers.co.za  This test is not yet approved or  cleared by the [place] FDA and has been authorized for detection and/or diagnosis of SARS-CoV-2 by FDA under an Emergency Use Authorization (EUA).  This EUA will remain in effect (meaning this test can be used) for the duration of the COVID-19 declaration under Section 564(b)(1) of the Act, 21 U.S.C. section 360bbb-3(b)(1), unless the authorization is terminated or revoked sooner.  Performed at [facility], [place] [address], [place], [place] [address]   Aerobic/Anaerobic Culture (surgical/deep wound)     Status: None   Collection Time: 05/26/20  5:33 PM   Specimen: PATH Other; Wound  Result Value Ref Range Status   Specimen Description HAND  Final   Special Requests RIGHT SAMPLE A  Final   Gram Stain   Final    MODERATE WBC PRESENT, PREDOMINANTLY PMN ABUNDANT GRAM NEGATIVE RODS    Culture   Final    ABUNDANT BACTEROIDES FRAGILIS BETA LACTAMASE POSITIVE WITH IN NORMAL SKIN FLORA Performed at [facility], [place]  [address] [place], [place] [address]    Report Status 05/29/2020 FINAL  Final  Aerobic/Anaerobic Culture (surgical/deep wound)     Status: None   Collection Time: 05/26/20  6:07 PM   Specimen: PATH Bone biopsy; Tissue  Result Value Ref Range Status   Specimen Description TISSUE RIGHT HAND  Final   Special Requests BONE  Final   Gram Stain   Final    RARE WBC PRESENT, PREDOMINANTLY MONONUCLEAR  NO ORGANISMS SEEN    Culture   Final    RARE ALCALIGENES FAECALIS FEW BACTEROIDES FRAGILIS BETA LACTAMASE POSITIVE Performed at [facility], [place] [address] [place], [place] [address]    Report Status 05/30/2020 FINAL  Final   Organism ID, Bacteria ALCALIGENES FAECALIS  Final      Susceptibility   Alcaligenes faecalis - MIC*    CEFAZOLIN 32 INTERMEDIATE Intermediate     GENTAMICIN 2 SENSITIVE Sensitive     CIPROFLOXACIN 2 INTERMEDIATE Intermediate     IMIPENEM 0.5 SENSITIVE Sensitive     TRIMETH/SULFA <=20 SENSITIVE Sensitive     * RARE ALCALIGENES FAECALIS  Culture, blood (routine x 2)     Status: None (Preliminary result)   Collection Time: 05/26/20 10:03 PM   Specimen: BLOOD LEFT HAND  Result Value Ref Range Status   Specimen Description BLOOD LEFT HAND  Final   Special Requests   Final    BOTTLES DRAWN AEROBIC AND ANAEROBIC Blood Culture adequate volume   Culture   Final    NO GROWTH 3 DAYS Performed at [facility], [place] [address] [place], [place] [address]    Report Status PENDING  Incomplete  Culture, blood (routine x 2)     Status: None (Preliminary result)   Collection Time: 05/26/20 10:09 PM   Specimen: BLOOD LEFT ARM  Result Value Ref Range Status   Specimen Description BLOOD LEFT ARM  Final   Special Requests   Final    BOTTLES DRAWN AEROBIC ONLY Blood Culture results may not be optimal due to an inadequate volume of blood received in culture bottles   Culture   Final    NO GROWTH 3 DAYS Performed at [facility], [place] [address], [place], [place]  [address]    Report Status PENDING  Incomplete  MRSA PCR Screening     Status: None   Collection Time: 05/27/20  6:33 PM   Specimen: Nasopharyngeal  Result Value Ref Range Status   MRSA by PCR NEGATIVE NEGATIVE Final    Comment:        The GeneXpert MRSA Assay (FDA approved for NASAL specimens only), is one component of a comprehensive MRSA colonization surveillance program. It is not intended to diagnose MRSA infection nor to guide or monitor treatment for MRSA infections. Performed at [facility], [place] [address], [place], [place] [address]       Studies: No results found.  Scheduled Meds: . atorvastatin  20 mg Oral QHS  . calcitRIOL  2 mcg Oral Q M,W,F-HD  . carvedilol  12.5 mg Oral BID WC  . Chlorhexidine Gluconate Cloth  6 each Topical Q0600  . Chlorhexidine Gluconate Cloth  6 each Topical Q0600  . darbepoetin (ARANESP) injection - DIALYSIS  100 mcg Intravenous Q Fri-HD  . feeding supplement (NEPRO CARB STEADY)  237 mL Oral BID BM  . ferrous sulfate  325 mg Oral Q breakfast  . heparin injection (subcutaneous)  5,000 Units Subcutaneous Q8H  . linagliptin  5 mg Oral Daily  . pregabalin  100 mg Oral QHS  . saccharomyces boulardii  250 mg Oral BID  . sevelamer carbonate  2,400 mg Oral TID WC    Continuous Infusions: . meropenem (MERREM) IV 500 mg (05/30/20 2059)     LOS: 5 days     Kayleen Memos, MD [facility] Pager [phone]  If 7PM-7AM, please contact night-coverage [email or web address] Password [facility] 05/31/2020, 12:51 PM

## 2020-05-31 NOTE — TOC Initial Note (Signed)
Transition of Care [facility]) - Initial/Assessment Note    Patient Details  Name: [name] MRN: [id number] Date of Birth: Nov 22, 1961  Transition of Care [facility]) CM/SW Contact:    Sharin Mons, RN Phone Number: 05/31/2020, 4:15 PM  Clinical Narrative:          Presented with R hand cellulitis. Hx of       PVD and CKD / dialysis MWF. From home with wife. [name] (Spouse)     [id number]      Plan:Tunneled central catheter placement IV access, 6/29. Pt will need LT iv ABX therapy.  NCM spoke with pt @ bedside regarding d/c planning.  Pt agreeable to [facility] services, pt without preference. Referral made with [facility] for HHRN, and [facility] for IV ABX therapy. Pt states wife will assist with caring for him once d/c and is teachable for IV ABX therapy.  TOC  Team will continue to monitor for needs....  Expected Discharge Plan: [place] Barriers to Discharge: Continued Medical Work up   Patient Goals and CMS Choice     Choice offered to / list presented to : Patient  Expected Discharge Plan and Services Expected Discharge Plan: [place]   Discharge Planning Services: CM Consult Post Acute Care Choice: Durable Medical Equipment, Home Health Living arrangements for the past 2 months: Single Family Home                 DME Arranged: Other see comment (IV Antibiotic therapy) DME Agency: Other - Comment ([employer]) Date DME Agency Contacted: 05/31/20 Time DME Agency Contacted: [phone] Representative spoke with at [place]: [place]: RN [place] Agency: [place] ([place]) Date [place]: 05/31/20 Time [place]: [place] Representative spoke with at [place]: Butch Penny  Prior Living Arrangements/Services Living arrangements for the past 2 months: [place] with:: Spouse   Do you feel safe going back to the place where you live?: Yes               Activities of Daily Living Home  Assistive Devices/Equipment: CBG Meter, Dentures (specify type), Eyeglasses, Cane (specify quad or straight), Walker (specify type), Shower chair with back ADL Screening (condition at time of admission) Patient's cognitive ability adequate to safely complete daily activities?: Yes Is the patient deaf or have difficulty hearing?: No Does the patient have difficulty seeing, even when wearing glasses/contacts?: No Does the patient have difficulty concentrating, remembering, or making decisions?: No Patient able to express need for assistance with ADLs?: Yes Does the patient have difficulty dressing or bathing?: Yes Independently performs ADLs?: No Communication: Independent Dressing (OT): Needs assistance Is this a change from baseline?: Pre-admission baseline Grooming: Needs assistance Is this a change from baseline?: Pre-admission baseline Feeding: Independent Bathing: Needs assistance Is this a change from baseline?: Pre-admission baseline Toileting: Independent Walks in Home: Independent with device (comment) Does the patient have difficulty walking or climbing stairs?: Yes Weakness of Legs: None Weakness of Arms/Hands: Right  Permission Sought/Granted                  Emotional Assessment              Admission diagnosis:  Gangrene of finger of right hand [facility]) [I96] Patient Active Problem List   Diagnosis Date Noted  . Gangrene of finger of right hand ([place]) 05/26/2020  . Diabetic neuropathy ([place])   . Ischemia of finger 04/23/2020  . Sepsis (  [place]) 03/11/2020  . Fever 03/10/2020  . Acute metabolic encephalopathy 67/54/4920  . Chronic hepatitis C without hepatic coma ([place]) 10/27/2019  . Unilateral complete BKA, right, initial encounter ([place]) 11/01/2018  . Diabetes mellitus type 2 in nonobese (HCC)   . Postoperative pain   . Neuropathic pain   . PVD (peripheral vascular disease) ([place]) 10/23/2018  . Hypokalemia 10/23/2018  . Chronic anemia 10/23/2018  . Ambulatory  dysfunction 10/23/2018  . GERD (gastroesophageal reflux disease) 10/23/2018  . Right foot pain 10/22/2018  . Hypocalcemia 09/09/2018  . Dehydration 09/09/2018  . Amputation of left lower extremity below knee ([place]) 06/04/2017  . Unilateral complete BKA, left, sequela ([place])   . Abnormality of gait   . Phantom limb pain ([place])   . End stage renal disease on dialysis ([place])   . Type 2 diabetes mellitus ([place])   . HLD (hyperlipidemia)   . Drug-induced constipation   . S/P bilateral BKA (below knee amputation) ([place])   . Malignant hypertension   . Post-operative pain   . Acute blood loss anemia   . S/P BKA (below knee amputation), left ([place]) 05/30/2017  . Infection of right hand 03/30/2017  . Hyponatremia 03/30/2017  . HTN (hypertension) 03/30/2017  . Necrosis of toe ([place]) 12/18/2013  . Osteomyelitis ([place]) 12/18/2013  . Leukocytosis 12/18/2013  . Anemia of chronic disease 12/18/2013   PCP:  [name], FNP Pharmacy:   [id number] [place], [place] [address] Phone: [phone] Fax: [phone]     Social Determinants of Health ([place]) Interventions    Readmission Risk Interventions Readmission Risk Prevention Plan 03/16/2020  Transportation Screening Complete  PCP or Specialist Appt within 3-5 Days (No Data)  [place] or [place] (No Data)  Medication Review (RN Care Manager) Complete  Some recent data might be hidden

## 2020-05-31 NOTE — Progress Notes (Signed)
[facility] Progress Note   Subjective:    Seen in room. No new complaints, pain controlled this am. Wound cx + Alcaligenes faecalis. For dialysis today.   Objective Vitals:   05/30/20 2010 05/31/20 0418 05/31/20 0754 05/31/20 0756  BP: (!) 177/85 (!) 199/95 (!) 184/86 (!) 169/76  Pulse: 68 68 69 68  Resp: 16 16    Temp: 98 F (36.7 C) 97.7 F (36.5 C) 98.6 F (37 C)   TempSrc: Oral Oral Oral   SpO2: 100% 99% 93% 90%  Weight:      Height:       Physical Exam General:Well appearing male in NAD Heart:RRR, no MRG Lungs:CTAB Abdomen:soft, NTND Extremities:b/l BKA, no edema. R hand bandaged Dialysis Access: LU AVF +b/t   Filed Weights   05/27/20 1145 05/28/20 0700 05/28/20 1123  Weight: 93.3 kg 89.9 kg 86.1 kg    Intake/Output Summary (Last 24 hours) at 05/31/2020 1046 Last data filed at 05/31/2020 0500 Gross per 24 hour  Intake --  Output 0 ml  Net 0 ml    Additional Objective Labs: Basic Metabolic Panel: Recent Labs  Lab 05/29/20 0457 05/30/20 0454 05/31/20 0458  NA 138 139 137  K 4.0 4.3 4.5  CL 95* 97* 96*  CO2 27 25 23   GLUCOSE 138* 114* 105*  BUN 21 35* 52*  CREATININE 5.74* 8.55* 9.94*  CALCIUM 9.4 9.8 9.8  PHOS 5.7*  --   --    Liver Function Tests: Recent Labs  Lab 05/26/20 1315 05/27/20 0402  AST 15 12*  ALT 17 13  ALKPHOS 70 62  BILITOT 1.0 1.1  PROT 8.0 7.5  ALBUMIN 3.0* 2.6*   CBC: Recent Labs  Lab 05/26/20 1315 05/26/20 1315 05/27/20 0402 05/27/20 0402 05/28/20 0352 05/28/20 0352 05/29/20 0457 05/30/20 0454 05/31/20 0458  WBC 11.2*   < > 10.5   < > 9.2   < > 8.1 8.2 9.0  NEUTROABS 8.0*  --   --   --   --   --   --   --   --   HGB 9.3*   < > 8.8*   < > 9.2*   < > 8.9* 9.4* 8.9*  HCT 31.5*   < > 30.1*   < > 30.9*   < > 30.9* 32.0* 30.5*  MCV 78.8*   < > 77.6*  --  78.0*  --  78.4* 79.0* 77.4*  PLT 163   < > 152   < > 167   < > 188 221 261   < > = values in this interval not displayed.   Blood Culture     Component Value Date/Time   SDES BLOOD LEFT ARM 05/26/2020 2209   SPECREQUEST  05/26/2020 2209    BOTTLES DRAWN AEROBIC ONLY Blood Culture results may not be optimal due to an inadequate volume of blood received in culture bottles   CULT  05/26/2020 2209    NO GROWTH 3 DAYS Performed at [facility], [place] [address] [place], [place] [address]    REPTSTATUS PENDING 05/26/2020 2209    CBG: Recent Labs  Lab 05/30/20 0801 05/30/20 1150 05/30/20 1606 05/30/20 2040 05/31/20 0751  GLUCAP 143* 108* 101* 127* 113*    Medications:  meropenem (MERREM) IV 500 mg (05/30/20 2059)    atorvastatin  20 mg Oral QHS   calcitRIOL  2 mcg Oral Q M,W,F-HD   carvedilol  12.5 mg Oral BID WC   Chlorhexidine Gluconate  Cloth  6 each Topical Q0600   Chlorhexidine Gluconate Cloth  6 each Topical Q0600   darbepoetin (ARANESP) injection - DIALYSIS  100 mcg Intravenous Q Fri-HD   feeding supplement (NEPRO CARB STEADY)  237 mL Oral BID BM   ferrous sulfate  325 mg Oral Q breakfast   heparin injection (subcutaneous)  5,000 Units Subcutaneous Q8H   linagliptin  5 mg Oral Daily   pregabalin  100 mg Oral QHS   saccharomyces boulardii  250 mg Oral BID   sevelamer carbonate  2,400 mg Oral TID WC    [facility], 200NRe, BFR 425, DFR 800, EDW 90kg, 2K/2.5Ca, AVF 16g, no heparin mircera 200mg  IV q 2 weeks- last dose 25mcg on 6/9 Venofer 100mg  q HD- received 0/10 doses  Calcitriol 2.65mcg PO q HD Renvela 800mg  4 tabs PO TID with meals Recent labs: 6/21 Hgb 9.8, Tsat 17%, Ferritin 642, Plt 216  Assessment/Plan: 1. 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 On Vanc/Zosyn.WBC trending down.Afebrile. WC +bacteroides fragilis & alcaligenes faecalis. ID consulted - will need 6 weeks meropenem via central line 2. ESRD:MWF. HD today on schedule.  3. L arm erythema:Resolved.   4. Hypertension/volume:BPvariable. On carvedilol 12.5mg  BID. Does not appear volume overloaded. Getting below OP EDW - lower on d/c.  Post weight Friday 86.1kg. 5. Anemia:Hgb 8.9 - getting Aranesp 170mcg q Fri. Hold IV Fe for now due to infection. 6. Metabolic bone disease:Ca ok, Phos borderline high. Continue calcitriol and renvela.  7. Nutrition:Renal diet/fluid restrictions. Vit. Protein supplements.   Lynnda Child PA-C [facility] 05/31/2020,10:50 AM

## 2020-05-31 NOTE — Progress Notes (Signed)
Spoke with patient RN regarding central line placement.  PICC RN educated RN that since patient is renal with dialysis per protocol the PICC team doesn't place.  PICC RN encouraged RN to follow up with patient RN.  Patient RN verbalized understanding.

## 2020-05-31 NOTE — Progress Notes (Signed)
Patient ID: [name], male   DOB: 1961/06/03, 59 y.o.   MRN: [id number]         [facility]  Date of Admission:  05/26/2020   Total days of antibiotics 6        Day 2 meropenem         ASSESSMENT: He has persistent, polymicrobial right hand infection and is required multiple recent surgeries.  Cultures obtained on 03/12/2020 grew Klebsiella oxytoca.  His most recent operative cultures have grown Alcaligenes and Bacteroides fragilis.  He had evidence of osteomyelitis.  I plan on a 6-week course of IV antibiotic therapy.  PLAN: 1. Continue meropenem while here then convert to ertapenem as an outpatient 2. Central line placement 3. I have arranged follow-up in my clinic and will sign off now  Diagnosis: Right hand infection  Culture Result: Klebsiella, Alcaligenes and Bacteroides  No Known Allergies  OPAT Orders Discharge antibiotics to be given via PICC line Discharge antibiotics: Per pharmacy protocol ertapenem  Duration: 6 weeks End Date: 07/06/2020  [facility] Care Per Protocol:  Home health RN for IV administration and teaching; PICC line care and labs.    Labs weekly while on IV antibiotics: _x_ CBC with differential _x_ BMP __ CMP _x_ CRP _x_ ESR __ Vancomycin trough __ CK  _x_ Please pull PIC at completion of IV antibiotics __ Please leave PIC in place until doctor has seen patient or been notified  Fax weekly labs to [phone]  Clinic Follow Up Appt: 06/10/2020  Principal Problem:   Infection of right hand Active Problems:   HTN (hypertension)   End stage renal disease on dialysis ([place])   Type 2 diabetes mellitus ([place])   HLD (hyperlipidemia)   PVD (peripheral vascular disease) ([facility])   GERD (gastroesophageal reflux disease)   Neuropathic pain   Diabetic neuropathy ([place])   Scheduled Meds: . atorvastatin  20 mg Oral QHS  . calcitRIOL  2 mcg Oral Q M,W,F-HD  . carvedilol  12.5 mg Oral BID WC  . Chlorhexidine  Gluconate Cloth  6 each Topical Q0600  . Chlorhexidine Gluconate Cloth  6 each Topical Q0600  . darbepoetin (ARANESP) injection - DIALYSIS  100 mcg Intravenous Q Fri-HD  . feeding supplement (NEPRO CARB STEADY)  237 mL Oral BID BM  . ferrous sulfate  325 mg Oral Q breakfast  . heparin injection (subcutaneous)  5,000 Units Subcutaneous Q8H  . linagliptin  5 mg Oral Daily  . pregabalin  100 mg Oral QHS  . saccharomyces boulardii  250 mg Oral BID  . sevelamer carbonate  2,400 mg Oral TID WC   Continuous Infusions: . meropenem (MERREM) IV 500 mg (05/30/20 2059)   PRN Meds:.acetaminophen **OR** acetaminophen, calcium carbonate (dosed in mg elemental calcium), famotidine, HYDROmorphone (DILAUDID) injection, ondansetron **OR** ondansetron (ZOFRAN) IV, oxyCODONE-acetaminophen   SUBJECTIVE: He has moderate pain.  Review of Systems: Review of Systems  Constitutional: Negative for fever.  Gastrointestinal: Negative for abdominal pain, diarrhea, nausea and vomiting.  Musculoskeletal: Positive for joint pain.    No Known Allergies  OBJECTIVE: Vitals:   05/30/20 2010 05/31/20 0418 05/31/20 0754 05/31/20 0756  BP: (!) 177/85 (!) 199/95 (!) 184/86 (!) 169/76  Pulse: 68 68 69 68  Resp: 16 16    Temp: 98 F (36.7 C) 97.7 F (36.5 C) 98.6 F (37 C)   TempSrc: Oral Oral Oral   SpO2: 100% 99% 93% 90%  Weight:      Height:  Body mass index is 23.11 kg/m.  Physical Exam Constitutional:      Comments: He is groggy.  Musculoskeletal:     Comments: His right hand is in a large bulky Ace wrap dressing.  His index long and ring fingers are surgically absent.     Lab Results Lab Results  Component Value Date   WBC 9.0 05/31/2020   HGB 8.9 (L) 05/31/2020   HCT 30.5 (L) 05/31/2020   MCV 77.4 (L) 05/31/2020   PLT 261 05/31/2020    Lab Results  Component Value Date   CREATININE 9.94 (H) 05/31/2020   BUN 52 (H) 05/31/2020   NA 137 05/31/2020   K 4.5 05/31/2020   CL 96 (L)  05/31/2020   CO2 23 05/31/2020    Lab Results  Component Value Date   ALT 13 05/27/2020   AST 12 (L) 05/27/2020   ALKPHOS 62 05/27/2020   BILITOT 1.1 05/27/2020     Microbiology: Recent Results (from the past 240 hour(s))  SARS Coronavirus 2 by RT PCR (hospital order, performed in [place] hospital lab) Nasopharyngeal Nasopharyngeal Swab     Status: None   Collection Time: 05/26/20 12:19 PM   Specimen: Nasopharyngeal Swab  Result Value Ref Range Status   SARS Coronavirus 2 NEGATIVE NEGATIVE Final    Comment: (NOTE) SARS-CoV-2 target nucleic acids are NOT DETECTED.  The SARS-CoV-2 RNA is generally detectable in upper and lower respiratory specimens during the acute phase of infection. The lowest concentration of SARS-CoV-2 viral copies this assay can detect is 250 copies / mL. A negative result does not preclude SARS-CoV-2 infection and should not be used as the sole basis for treatment or other patient management decisions.  A negative result may occur with improper specimen collection / handling, submission of specimen other than nasopharyngeal swab, presence of viral mutation(s) within the areas targeted by this assay, and inadequate number of viral copies (<250 copies / mL). A negative result must be combined with clinical observations, patient history, and epidemiological information.  Fact Sheet for Patients:   StrictlyIdeas.no  Fact Sheet for Healthcare Providers: BankingDealers.co.za  This test is not yet approved or  cleared by the [place] FDA and has been authorized for detection and/or diagnosis of SARS-CoV-2 by FDA under an Emergency Use Authorization (EUA).  This EUA will remain in effect (meaning this test can be used) for the duration of the COVID-19 declaration under Section 564(b)(1) of the Act, 21 U.S.C. section 360bbb-3(b)(1), unless the authorization is terminated or revoked sooner.  Performed at  [facility], [place] [address], [place], [place] [address]   Aerobic/Anaerobic Culture (surgical/deep wound)     Status: None   Collection Time: 05/26/20  5:33 PM   Specimen: PATH Other; Wound  Result Value Ref Range Status   Specimen Description HAND  Final   Special Requests RIGHT SAMPLE A  Final   Gram Stain   Final    MODERATE WBC PRESENT, PREDOMINANTLY PMN ABUNDANT GRAM NEGATIVE RODS    Culture   Final    ABUNDANT BACTEROIDES FRAGILIS BETA LACTAMASE POSITIVE WITH IN NORMAL SKIN FLORA Performed at [facility], [place] [address], [place], [place] [address]    Report Status 05/29/2020 FINAL  Final  Aerobic/Anaerobic Culture (surgical/deep wound)     Status: None   Collection Time: 05/26/20  6:07 PM   Specimen: PATH Bone biopsy; Tissue  Result Value Ref Range Status   Specimen Description TISSUE RIGHT HAND  Final  Special Requests BONE  Final   Gram Stain   Final    RARE WBC PRESENT, PREDOMINANTLY MONONUCLEAR NO ORGANISMS SEEN    Culture   Final    RARE ALCALIGENES FAECALIS FEW BACTEROIDES FRAGILIS BETA LACTAMASE POSITIVE Performed at [facility], [place] [address], [place], [place] [address]    Report Status 05/30/2020 FINAL  Final   Organism ID, Bacteria ALCALIGENES FAECALIS  Final      Susceptibility   Alcaligenes faecalis - MIC*    CEFAZOLIN 32 INTERMEDIATE Intermediate     GENTAMICIN 2 SENSITIVE Sensitive     CIPROFLOXACIN 2 INTERMEDIATE Intermediate     IMIPENEM 0.5 SENSITIVE Sensitive     TRIMETH/SULFA <=20 SENSITIVE Sensitive     * RARE ALCALIGENES FAECALIS  Culture, blood (routine x 2)     Status: None (Preliminary result)   Collection Time: 05/26/20 10:03 PM   Specimen: BLOOD LEFT HAND  Result Value Ref Range Status   Specimen Description BLOOD LEFT HAND  Final   Special Requests   Final    BOTTLES DRAWN AEROBIC AND ANAEROBIC Blood Culture adequate volume   Culture   Final    NO GROWTH 3 DAYS Performed at [facility], [place]  [address] [place], [place] [address]    Report Status PENDING  Incomplete  Culture, blood (routine x 2)     Status: None (Preliminary result)   Collection Time: 05/26/20 10:09 PM   Specimen: BLOOD LEFT ARM  Result Value Ref Range Status   Specimen Description BLOOD LEFT ARM  Final   Special Requests   Final    BOTTLES DRAWN AEROBIC ONLY Blood Culture results may not be optimal due to an inadequate volume of blood received in culture bottles   Culture   Final    NO GROWTH 3 DAYS Performed at [facility], [place] [address], [place], [place] [address]    Report Status PENDING  Incomplete  MRSA PCR Screening     Status: None   Collection Time: 05/27/20  6:33 PM   Specimen: Nasopharyngeal  Result Value Ref Range Status   MRSA by PCR NEGATIVE NEGATIVE Final    Comment:        The GeneXpert MRSA Assay (FDA approved for NASAL specimens only), is one component of a comprehensive MRSA colonization surveillance program. It is not intended to diagnose MRSA infection nor to guide or monitor treatment for MRSA infections. Performed at [facility], [place] [address] [place],  [address]     Michel Bickers, [facility] [phone] pager   [phone] cell 05/31/2020, 12:57 PM

## 2020-05-31 NOTE — Progress Notes (Signed)
   Patient Status: [facility] - In-pt  Assessment and Plan: Patient in need of venous access.   Tunneled central catheter placement IV access  ______________________________________________________________________   History of Present Illness: [name] is a 59 y.o. male   Rt hand osteomyelitis Need for long term at home IV antibiotics ESRD Peripheral vascular disease  Allergies and medications reviewed.   Review of Systems: A 12 point ROS discussed and pertinent positives are indicated in the HPI above.  All other systems are negative.   Vital Signs: BP (!) 169/76   Pulse 68   Temp 98.6 F (37 C) (Oral)   Resp 16   Ht 6\' 4"  (1.93 m)   Wt 189 lb 13.1 oz (86.1 kg)   SpO2 90%   BMI 23.11 kg/m   Physical Exam Vitals reviewed.  Cardiovascular:     Rate and Rhythm: Normal rate.  Pulmonary:     Breath sounds: Normal breath sounds.  Skin:    General: Skin is warm.  Neurological:     Mental Status: He is alert and oriented to person, place, and time.  Psychiatric:        Behavior: Behavior normal.      Imaging reviewed.   Labs:  COAGS: Recent Labs    03/09/20 2134  INR 1.2  APTT 38*    BMP: Recent Labs    05/28/20 0352 05/29/20 0457 05/30/20 0454 05/31/20 0458  NA 136 138 139 137  K 3.8 4.0 4.3 4.5  CL 91* 95* 97* 96*  CO2 28 27 25 23   GLUCOSE 153* 138* 114* 105*  BUN 31* 21 35* 52*  CALCIUM 8.5* 9.4 9.8 9.8  CREATININE 7.62* 5.74* 8.55* 9.94*  GFRNONAA 7* 10* 6* 5*  GFRAA 8* 11* 7* 6*   Scheduled for tunneled central catheter placement Rt hand osteomyelitis Need for home IV antibiotics Pt is aware of procedure benefits and risks Agreeable to proceed Consent signed in chart    Electronically Signed: Lavonia Drafts, PA-C 05/31/2020, 11:51 AM   I spent a total of 15 minutes in face to face in clinical consultation, greater than 50% of which was counseling/coordinating care for venous access.

## 2020-05-31 NOTE — Plan of Care (Signed)
  Problem: Education: Goal: Knowledge of General Education information will improve Description: Including pain rating scale, medication(s)/side effects and non-pharmacologic comfort measures Outcome: Progressing   Problem: Activity: Goal: Risk for activity intolerance will decrease Outcome: Progressing   Problem: Pain Managment: Goal: General experience of comfort will improve Outcome: Progressing   

## 2020-06-01 ENCOUNTER — Inpatient Hospital Stay (HOSPITAL_COMMUNITY): Payer: Medicare Other

## 2020-06-01 HISTORY — PX: IR FLUORO GUIDE CV LINE RIGHT: IMG2283

## 2020-06-01 HISTORY — PX: IR US GUIDE VASC ACCESS RIGHT: IMG2390

## 2020-06-01 LAB — BASIC METABOLIC PANEL
Anion gap: 12 (ref 5–15)
BUN: 23 mg/dL (ref 8–23)
CO2: 27 mmol/L (ref 22–32)
Calcium: 9.1 mg/dL (ref 8.9–10.3)
Chloride: 98 mmol/L (ref 98–111)
Creatinine, Ser: 5.5 mg/dL — ABNORMAL HIGH (ref 0.61–1.24)
GFR calc Af Amer: 12 mL/min — ABNORMAL LOW (ref >60)
GFR calc non Af Amer: 10 mL/min — ABNORMAL LOW (ref >60)
Glucose, Bld: 142 mg/dL — ABNORMAL HIGH (ref 70–99)
Potassium: 3.9 mmol/L (ref 3.5–5.1)
Sodium: 137 mmol/L (ref 135–145)

## 2020-06-01 LAB — CULTURE, BLOOD (ROUTINE X 2)
Culture: NO GROWTH
Culture: NO GROWTH
Special Requests: ADEQUATE

## 2020-06-01 LAB — CBC
HCT: 30.9 % — ABNORMAL LOW (ref 39.0–52.0)
Hemoglobin: 9.2 g/dL — ABNORMAL LOW (ref 13.0–17.0)
MCH: 22.9 pg — ABNORMAL LOW (ref 26.0–34.0)
MCHC: 29.8 g/dL — ABNORMAL LOW (ref 30.0–36.0)
MCV: 76.9 fL — ABNORMAL LOW (ref 80.0–100.0)
Platelets: 259 10*3/uL (ref 150–400)
RBC: 4.02 MIL/uL — ABNORMAL LOW (ref 4.22–5.81)
RDW: 20 % — ABNORMAL HIGH (ref 11.5–15.5)
WBC: 11.6 10*3/uL — ABNORMAL HIGH (ref 4.0–10.5)
nRBC: 0 % (ref 0.0–0.2)

## 2020-06-01 LAB — GLUCOSE, CAPILLARY
Glucose-Capillary: 119 mg/dL — ABNORMAL HIGH (ref 70–99)
Glucose-Capillary: 113 mg/dL — ABNORMAL HIGH (ref 70–99)
Glucose-Capillary: 134 mg/dL — ABNORMAL HIGH (ref 70–99)

## 2020-06-01 MED ORDER — HEPARIN SOD (PORK) LOCK FLUSH 100 UNIT/ML IV SOLN
250.0000 [IU] | INTRAVENOUS | Status: DC | PRN
  Filled 2020-06-01: qty 2.5

## 2020-06-01 MED ORDER — CARVEDILOL 12.5 MG PO TABS
12.5000 mg | ORAL_TABLET | Freq: Two times a day (BID) | ORAL | Status: DC
  Administered 2020-06-01: 12.5 mg via ORAL
  Filled 2020-06-01: qty 1

## 2020-06-01 MED ORDER — NEPRO/CARBSTEADY PO LIQD: 237.0000 mL | Freq: Two times a day (BID) | ORAL | 0 refills | Status: AC

## 2020-06-01 MED ORDER — LIDOCAINE HCL (PF) 1 % IJ SOLN
INTRAMUSCULAR | Status: DC | PRN
  Administered 2020-06-01: 10 mL

## 2020-06-01 MED ORDER — CALCITRIOL 0.5 MCG PO CAPS: 2.0000 ug | ORAL_CAPSULE | ORAL | 0 refills | Status: AC

## 2020-06-01 MED ORDER — LIDOCAINE HCL 1 % IJ SOLN
INTRAMUSCULAR | Status: AC
  Filled 2020-06-01: qty 20

## 2020-06-01 MED ORDER — SACCHAROMYCES BOULARDII 250 MG PO CAPS: 250.0000 mg | ORAL_CAPSULE | Freq: Two times a day (BID) | ORAL | 0 refills | Status: AC

## 2020-06-01 MED ORDER — FERROUS SULFATE 325 (65 FE) MG PO TABS: 325.0000 mg | ORAL_TABLET | Freq: Every day | ORAL | 0 refills | Status: DC

## 2020-06-01 MED ORDER — ERTAPENEM IV (FOR PTA / DISCHARGE USE ONLY)
500.0000 mg | INTRAVENOUS | 0 refills | Status: DC
Start: 2020-06-01 — End: 2020-07-07

## 2020-06-01 MED ORDER — HEPARIN SOD (PORK) LOCK FLUSH 100 UNIT/ML IV SOLN
250.0000 [IU] | INTRAVENOUS | Status: AC | PRN
  Administered 2020-06-01: 250 [IU]
  Filled 2020-06-01: qty 2.5

## 2020-06-01 NOTE — Discharge Instructions (Signed)
Osteomyelitis, Adult  Bone infections (osteomyelitis) occur when bacteria or other germs get inside a bone. This can happen if you have an infection in another part of your body that spreads through your blood. Germs from your skin or from outside of your body can also cause this type of infection if you have a wound or a broken bone (fracture) that breaks the skin. Bone infections need to be treated quickly to prevent bone damage and to prevent the infection from spreading to other areas of your body. What are the causes? Most bone infections are caused by bacteria. They can also be caused by other germs, such as viruses and funguses. What increases the risk? You are more likely to develop this condition if you:  Recently had surgery, especially bone or joint surgery.  Have a long-term (chronic) disease, such as: ? Diabetes. ? HIV (human immunodeficiency virus). ? Rheumatoid arthritis. ? Sickle cell anemia. ? Kidney disease that requires dialysis.  Are aged 60 years or older.  Have a condition or take medicines that block or weaken your body's defense system (immune system).  Have a condition that reduces your blood flow.  Have an artificial joint.  Have had a joint or bone repaired with plates or screws (surgical hardware).  Use IV drugs.  Have a central line for IV access.  Have had trauma, such as stepping on a nail or a broken bone that came through the skin. What are the signs or symptoms? Symptoms vary depending on the type and location of your infection. Common symptoms of bone infections include:  Fever and chills.  Skin redness and warmth.  Swelling.  Pain and stiffness.  Drainage of fluid or pus near the infection. How is this diagnosed? This condition may be diagnosed based on:  Your symptoms and medical history.  A physical exam.  Tests, such as: ? A sample of tissue, fluid, or blood taken to be examined under a microscope. ? Pus or discharge swabbed  from a wound for testing to identify germs and to determine what type of medicine will kill them (culture and sensitivity). ? Blood tests.  Imaging studies. These may include: ? X-rays. ? MRI. ? CT scan. ? Bone scan. ? Ultrasound. How is this treated? Treatment for this condition depends on the cause and type of infection. Antibiotic medicines are usually the first treatment for a bone infection. This may be done in a hospital at first. You may have to continue IV antibiotics at home or take antibiotics by mouth for several weeks after that. Other treatments may include surgery to remove:  Dead or dying tissue from a bone.  An infected artificial joint.  Infected plates or screws that were used to repair a broken bone. Follow these instructions at home: Medicines   Take over-the-counter and prescription medicines only as told by your health care provider.  Take your antibiotic medicine as told by your health care provider. Do not stop taking the antibiotic even if you start to feel better.  Follow instructions from your health care provider about how to take IV antibiotics at home. You may need to have a nurse come to your home to give you the IV antibiotics. General instructions   Ask your health care provider if you have any restrictions on your activities.  If directed, put ice on the affected area: ? Put ice in a plastic bag. ? Place a towel between your skin and the bag. ? Leave the ice on for 20   minutes, 2-3 times a day.  Wash your hands often with soap and water. If soap and water are not available, use hand sanitizer.  Do not use any products that contain nicotine or tobacco, such as cigarettes and e-cigarettes. These can delay bone healing. If you need help quitting, ask your health care provider.  Keep all follow-up visits as told by your health care provider. This is important. Contact a health care provider if:  You develop a fever or chills.  You have  redness, warmth, pain, or swelling that returns after treatment. Get help right away if:  You have rapid breathing or you have trouble breathing.  You have chest pain.  You cannot drink fluids or make urine.  The affected area swells, changes color, or turns blue.  You have numbness or severe pain in the affected area. Summary  Bone infections (osteomyelitis) occur when bacteria or other germs get inside a bone.  You may be more likely to get this type of infection if you have a condition, such as diabetes, that lowers your ability to fight infection or increases your chances of getting an infection.  Most bone infections are caused by bacteria. They can also be caused by other germs, such as viruses and funguses.  Treatment for this condition usually starts with taking antibiotics. Further treatment depends on the cause and type of infection. This information is not intended to replace advice given to you by your health care provider. Make sure you discuss any questions you have with your health care provider. Document Revised: 12/06/2017 Document Reviewed: 11/29/2017 Elsevier Patient Education  2020 Elsevier Inc.  

## 2020-06-01 NOTE — Progress Notes (Signed)
Patient discharging home. Discharge instructions explained to patient and he verbalized understanding. Packed all personal belongings. No further questions or concerns voiced.

## 2020-06-01 NOTE — Progress Notes (Signed)
[facility] Progress Note   Subjective:    Seen in room. Central line placed for antibiotics. No complaints. For discharge today.   Objective Vitals:   06/01/20 0027 06/01/20 0203 06/01/20 0507 06/01/20 0759  BP: (!) 150/72 134/70 (!) 156/80 133/76  Pulse: 60 81 75 78  Resp: 18 16 15 17   Temp: 98.7 F (37.1 C) 99 F (37.2 C) 98.7 F (37.1 C) 98.6 F (37 C)  TempSrc: Oral Oral Oral Oral  SpO2: (!) 78% 98% 100% 95%  Weight:      Height:       Physical Exam General:Well appearing male in NAD Heart:RRR, no MRG Lungs:CTAB Abdomen:soft, NTND Extremities:b/l BKA, no edema. R hand bandaged Dialysis Access: LU AVF +b/t   Filed Weights   05/28/20 1123 05/31/20 1909 05/31/20 2335  Weight: 86.1 kg 94.1 kg 91.4 kg    Intake/Output Summary (Last 24 hours) at 06/01/2020 1050 Last data filed at 05/31/2020 2335 Gross per 24 hour  Intake 96.41 ml  Output 2800 ml  Net -2703.59 ml    Additional Objective Labs: Basic Metabolic Panel: Recent Labs  Lab 05/29/20 0457 05/29/20 0457 05/30/20 0454 05/31/20 0458 06/01/20 0357  NA 138   < > 139 137 137  K 4.0   < > 4.3 4.5 3.9  CL 95*   < > 97* 96* 98  CO2 27   < > 25 23 27   GLUCOSE 138*   < > 114* 105* 142*  BUN 21   < > 35* 52* 23  CREATININE 5.74*   < > 8.55* 9.94* 5.50*  CALCIUM 9.4   < > 9.8 9.8 9.1  PHOS 5.7*  --   --   --   --    < > = values in this interval not displayed.   Liver Function Tests: Recent Labs  Lab 05/26/20 1315 05/27/20 0402  AST 15 12*  ALT 17 13  ALKPHOS 70 62  BILITOT 1.0 1.1  PROT 8.0 7.5  ALBUMIN 3.0* 2.6*   CBC: Recent Labs  Lab 05/26/20 1315 05/27/20 0402 05/28/20 0352 05/28/20 0352 05/29/20 0457 05/29/20 0457 05/30/20 0454 05/31/20 0458 06/01/20 0357  WBC 11.2*   < > 9.2   < > 8.1   < > 8.2 9.0 11.6*  NEUTROABS 8.0*  --   --   --   --   --   --   --   --   HGB 9.3*   < > 9.2*   < > 8.9*   < > 9.4* 8.9* 9.2*  HCT 31.5*   < > 30.9*   < > 30.9*   < > 32.0* 30.5*  30.9*  MCV 78.8*   < > 78.0*  --  78.4*  --  79.0* 77.4* 76.9*  PLT 163   < > 167   < > 188   < > 221 261 259   < > = values in this interval not displayed.   Blood Culture    Component Value Date/Time   SDES BLOOD LEFT ARM 05/26/2020 2209   SPECREQUEST  05/26/2020 2209    BOTTLES DRAWN AEROBIC ONLY Blood Culture results may not be optimal due to an inadequate volume of blood received in culture bottles   CULT  05/26/2020 2209    NO GROWTH 5 DAYS Performed at [facility], [place] [address] [place], [place] [address]    REPTSTATUS 06/01/2020 FINAL 05/26/2020 2209    CBG: Recent Labs  Lab  05/30/20 2040 05/31/20 0751 05/31/20 1147 05/31/20 1640 06/01/20 0756  GLUCAP 127* 113* 131* 113* 119*    Medications: . ertapenem     . atorvastatin  20 mg Oral QHS  . calcitRIOL  2 mcg Oral Q M,W,F-HD  . carvedilol  12.5 mg Oral BID WC  . Chlorhexidine Gluconate Cloth  6 each Topical Q0600  . Chlorhexidine Gluconate Cloth  6 each Topical Q0600  . darbepoetin (ARANESP) injection - DIALYSIS  100 mcg Intravenous Q Fri-HD  . feeding supplement (NEPRO CARB STEADY)  237 mL Oral BID BM  . ferrous sulfate  325 mg Oral Q breakfast  . heparin injection (subcutaneous)  5,000 Units Subcutaneous Q8H  . lidocaine      . linagliptin  5 mg Oral Daily  . pregabalin  100 mg Oral QHS  . saccharomyces boulardii  250 mg Oral BID  . sevelamer carbonate  2,400 mg Oral TID WC    [facility] Orders: [facility], 200NRe, BFR 425, DFR 800, EDW 90kg, 2K/2.5Ca, AVF 16g, no heparin mircera 200mg  IV q 2 weeks- last dose 228mcg on 6/9 Venofer 100mg  q HD- received 0/10 doses  Calcitriol 2.45mcg PO q HD Renvela 800mg  4 tabs PO TID with meals Recent labs: 6/21 Hgb 9.8, Tsat 17%, Ferritin 642, Plt 216  Assessment/Plan: 1. 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 On Vanc/Zosyn.WBC trending down.Afebrile. WC + Klebsiella, Bacteroides fragilis  & Alcaligenes faecalis. ID consulted - Meropenem started here then ertapenem as outpatient. Will continue 6 week antibiotic course via central line.  2. ESRD:MWF HD. Next HD 6/30 at outpatient center.  3. L arm erythema:Resolved.  4. Hypertension/volume:BPvariable. On carvedilol 12.5mg  BID. Does not appear volume overloaded. Getting below OP EDW - lower on d/c.  Post weight Monday 91.4kg.  5. Anemia:Hgb 8.9 - getting Aranesp 128mcg q Fri. Hold IV Fe for now due to infection. 6. Metabolic bone disease:Ca ok, Phos borderline high. Continue calcitriol and renvela.  7. Nutrition:Renal diet/fluid restrictions. Vit. Protein supplements.   Lynnda Child PA-C [facility] 06/01/2020,10:50 AM

## 2020-06-01 NOTE — Care Management Important Message (Signed)
Important Message  Patient Details  Name: [name] MRN: [id number] Date of Birth: 1961/10/20   Medicare Important Message Given:  Yes     Migdalia Olejniczak 06/01/2020, 10:36 AM

## 2020-06-01 NOTE — Procedures (Signed)
Interventional Radiology Procedure Note  Procedure: Placement of a right IJ approach double lumen cuffed CVC.  Tip is positioned at the superior cavoatrial junction and catheter is ready for immediate use.  Complications: None Recommendations:  - Ok to use - Do not submerge - Routine care   Signed,  Dulcy Fanny. [name], DO

## 2020-06-01 NOTE — TOC Transition Note (Signed)
Transition of Care [facility]) - CM/SW Discharge Note   Patient Details  Name: CORDERRO KOLOSKI MRN: [id number] Date of Birth: 1961-04-04  [facility]) CM/SW Contact:  Sharin Mons, RN Phone Number: [phone] 06/01/2020, 11:16 AM   Clinical Narrative:   Presented with R hand osteo,s/p amputation of 2nd and 3rd fingersand debridement 6/23. Hx ofESRD/MWF. Pt will transition to home today. Pt will receive [name] services from [facility], [place]. [facility] will provide IV ABX. Pam with [facility] to teach wife home infusion therapy prior to d/c. Pt without Rx meds affordability  Wife to provide transportation to home  Final next level of care: Home w Home Health Services Barriers to Discharge: No Barriers Identified   Patient Goals and CMS Choice     Choice offered to / list presented to : Patient  Discharge Placement                       Discharge Plan and Services   Discharge Planning Services: CM Consult Post Acute Care Choice: Durable Medical Equipment, Home Health          DME Arranged: Other see comment (IV Antibiotic therapy) DME Agency: Other - Comment Roel Cluck) Date DME Agency Contacted: 05/31/20 Time DME Agency Contacted: [phone] Representative spoke with at DME Agency: [place]: RN, Social Work [place]: [place] ([place]) Date [place]: 05/31/20 Time [place]: 1614 Representative spoke with at [place]: [place] ([place]) Interventions     Readmission Risk Interventions Readmission Risk Prevention Plan 03/16/2020  Transportation Screening Complete  PCP or Specialist Appt within 3-5 Days (No Data)  [place] or [place] (No Data)  Medication Review Press photographer) Complete  Some recent data might be hidden

## 2020-06-01 NOTE — Anesthesia Postprocedure Evaluation (Signed)
Anesthesia Post Note  Patient: [name]  Procedure(s) Performed: IRRIGATION AND DEBRIDEMENT RIGHT HAND (Right Hand)     Patient location during evaluation: PACU Anesthesia Type: General Level of consciousness: awake and alert Pain management: pain level controlled Vital Signs Assessment: post-procedure vital signs reviewed and stable Respiratory status: spontaneous breathing, nonlabored ventilation, respiratory function stable and patient connected to nasal cannula oxygen Cardiovascular status: blood pressure returned to baseline and stable Postop Assessment: no apparent nausea or vomiting Anesthetic complications: no   No complications documented.  Last Vitals:  Vitals:   06/01/20 0507 06/01/20 0759  BP: (!) 156/80 133/76  Pulse: 75 78  Resp: 15 17  Temp: 37.1 C 37 C  SpO2: 100% 95%    Last Pain:  Vitals:   06/01/20 0800  TempSrc:   PainSc: 5                  Alton Tremblay

## 2020-06-01 NOTE — Discharge Summary (Signed)
Discharge Summary  [name]:[id number] DOB: 21-Dec-1960  PCP: [name], FNP  Admit date: 05/26/2020 Discharge date: 06/01/2020  Time spent: 35 minutes  Recommendations for Outpatient Follow-up:  1. Follow-up with infectious disease 2. Follow-up with your PCP 3. Follow-up with nephrology 4. Keep your hemodialysis appointments 5. Take your medications as prescribed  Discharge Diagnoses:  Active Hospital Problems   Diagnosis Date Noted  . Infection of right hand 03/30/2017  . Diabetic neuropathy ([place])   . Neuropathic pain   . PVD (peripheral vascular disease) ([place]) 10/23/2018  . GERD (gastroesophageal reflux disease) 10/23/2018  . Type 2 diabetes mellitus ([place])   . HLD (hyperlipidemia)   . End stage renal disease on dialysis ([place])   . HTN (hypertension) 03/30/2017    Resolved Hospital Problems  No resolved problems to display.    Discharge Condition: Stable  Diet recommendation: Renal hemodialysis diet  Vitals:   06/01/20 0507 06/01/20 0759  BP: (!) 156/80 133/76  Pulse: 75 78  Resp: 15 17  Temp: 98.7 F (37.1 C) 98.6 F (37 C)  SpO2: 100% 95%    History of present illness:  [name] A Speightis an 59 y.o.malewith a past medical history that includes end-stage renal disease a Monday Wednesday Friday, hypertension, peripheral vascular disease status post bilateralbelow-the-knee amputation, types 2 diabetes, on presented on June 23, 21 for incision and debridement of right middle and ringfinger surgical wound (fingers amputated 04/24/20) per ortho. I and D completed on 6/23 and patient dialyzed 6/24 as he missed his last hemodialysis appointment.   Hemodialyzed on 05/27/2020, 05/28/2020, and 05/31/2020.  Deep wound culture with polymicrobial growth.  Infectious disease consulted.  Recommended 6 weeks of IV antibiotics.  Completed 5 days of IV vancomycin and Zosyn on 05/31/2020.  Switched to ertapenem.  Interventional radiology consulted for central  line placement for long-term IV antibiotics administration.  06/01/20: No acute events overnight.  Will complete 6 weeks of IV antibiotics as recommended by infectious disease at home with home health services.  Will follow up with infectious disease outpatient.  Hospital Course:  Principal Problem:   Infection of right hand Active Problems:   HTN (hypertension)   End stage renal disease on dialysis ([name])   Type 2 diabetes mellitus ([facility])   HLD (hyperlipidemia)   PVD (peripheral vascular disease) ([facility])   GERD (gastroesophageal reflux disease)   Neuropathic pain   Diabetic neuropathy ([facility])  Polymicrobial right hand osteomyelitis  Patient has history of right index, middle and ring finger amputation2/2 to ischemic changes.s/pirrigation and debridement of right hand 6/23/21by Dr.Weingold. Deep wound culture taken on 05/26/2020 reveals abundant Bacteroides fragilis, beta-lactamase positive.  Gram stain revealed abundant gram-negative rods, final.  Also revealed rare Alcaligenes Faecalis.  Seen by infectious disease, switched to meropenem on 06/01/2019, then ertapenem, will need 6 weeks of IV antibiotics.   Interventional radiology consulted for central line placement.   CRP 7.3, sed rate 98. Continue IV antibiotics infusion at home with home health services Follow-up with infectious disease on 06/10/2020.  ESRD on hemodialysis Monday, Wednesday and Friday.  Appreciate nephrology assistance.  Dialysis on 05/27/2020, 05/28/2020, and 05/31/2020 -Continue Renvela and calcium carbonate -Follow-up with nephrology -Keep your hemodialysis appointments  Anemia of chronic disease/iron deficiency anemia Continue ferrous sulfate Hemoglobin stable at baseline 9.2. No overt bleeding. Aranesp every Friday. Follow-up with nephrology  Hypertension:  Blood pressure is stable -Continue Coreg. Follow-up with your PCP  Type II diabetes mellitus, well controlled: Continue to hold off  Lantus  Resume home Tradjenta Hemoglobin A1c 5.3 Avoid hypoglycemia Follow-up with your PCP  Diabetic neuropathy: -Continue Lyrica  Severe PVD status post bilateral BKA: -Continue statin    Code Status:Full Code.   Medical Consultants:    Orthopedic surgery Dr. Burney Gauze   Nephrology Dr. Moshe Cipro  Infectious disease Dr. Megan Salon  Interventional radiology for central line placement   Anti-Infectives:    Vancomycin 6/23>>05/30/20  Zosyn 6/23>>05/30/20  Meropenem 05/30/2020>> 05/31/2020  Ertapenem 06/01/2020>> x6 weeks, end date: 07/06/2020.     Discharge Exam: BP 133/76 (BP Location: Right Arm)   Pulse 78   Temp 98.6 F (37 C) (Oral)   Resp 17   Ht '6\' 4"'  (1.93 m)   Wt 91.4 kg   SpO2 95%   BMI 24.53 kg/m  . General: 59 y.o. year-old male well developed well nourished in no acute distress.  Alert and oriented x3. . Cardiovascular: Regular rate and rhythm with no rubs or gallops.  No thyromegaly or JVD noted.   Marland Kitchen Respiratory: Clear to auscultation with no wheezes or rales. Good inspiratory effort. . Abdomen: Soft nontender nondistended with normal bowel sounds x4 quadrants. . Musculoskeletal: Bilateral below the knee amputation.  Right hand in surgical dressing with several fingers amputated. Marland Kitchen Psychiatry: Mood is appropriate for condition and setting  Discharge Instructions You were cared for by a hospitalist during your hospital stay. If you have any questions about your discharge medications or the care you received while you were in the hospital after you are discharged, you can call the unit and asked to speak with the hospitalist on call if the hospitalist that took care of you is not available. Once you are discharged, your primary care physician will handle any further medical issues. Please note that NO REFILLS for any discharge medications will be authorized once you are discharged, as it is imperative that you return to your primary care  physician (or establish a relationship with a primary care physician if you do not have one) for your aftercare needs so that they can reassess your need for medications and monitor your lab values.  Discharge Instructions    [facility] pharmacist to adjust dose for Vancomycin, Aminoglycosides and other anti-infective therapies as requested by physician.   Complete by: As directed    [facility] to provide Cath Flo 52m   Complete by: As directed    Administer for PICC line occlusion and as ordered by physician for other access device issues.   Anaphylaxis Kit: Provided to treat any anaphylactic reaction to the medication being provided to the patient if First Dose or when requested by physician   Complete by: As directed    Epinephrine 147mml vial / amp: Administer 0.2m62m0.2ml95mubcutaneously once for moderate to severe anaphylaxis, nurse to call physician and pharmacy when reaction occurs and call 911 if needed for immediate care   Diphenhydramine 50mg80mIV vial: Administer 25-50mg 42mM PRN for first dose reaction, rash, itching, mild reaction, nurse to call physician and pharmacy when reaction occurs   Sodium Chloride 0.9% NS 500ml I52mdminister if needed for hypovolemic blood pressure drop or as ordered by physician after call to physician with anaphylactic reaction   Change dressing on IV access line weekly and PRN   Complete by: As directed    Flush IV access with Sodium Chloride 0.9% and Heparin 10 units/ml or 100 units/ml   Complete by: As directed    Home infusion instructions - [facility]   Complete  by: As directed    Instructions: Flush IV access with Sodium Chloride 0.9% and Heparin 10units/ml or 100units/ml   Change dressing on IV access line: Weekly and PRN   Instructions Cath Flo 57m: Administer for PICC Line occlusion and as ordered by physician for other access device   [facility] pharmacist to adjust dose for: Vancomycin,  Aminoglycosides and other anti-infective therapies as requested by physician   Method of administration may be changed at the discretion of home infusion pharmacist based upon assessment of the patient and/or caregiver's ability to self-administer the medication ordered   Complete by: As directed      Allergies as of 06/01/2020   No Known Allergies     Medication List    STOP taking these medications   gabapentin 300 MG capsule Commonly known as: NEURONTIN   Lantus SoloStar 100 UNIT/ML Solostar Pen Generic drug: insulin glargine     TAKE these medications   atorvastatin 20 MG tablet Commonly known as: LIPITOR Take 20 mg by mouth at bedtime.   calcitRIOL 0.5 MCG capsule Commonly known as: ROCALTROL Take 4 capsules (2 mcg total) by mouth every Monday, Wednesday, and Friday with hemodialysis. Start taking on: June 02, 2020   calcium carbonate (dosed in mg elemental calcium) 1250 MG/5ML Susp Take 5 mLs (500 mg of elemental calcium total) by mouth every 6 (six) hours as needed for indigestion.   carvedilol 12.5 MG tablet Commonly known as: COREG Take 12.5 mg by mouth 2 (two) times daily with a meal. Take once on dialysis days   DIALYVITE 800 WITH ZINC 0.8 MG Tabs Take 1 tablet by mouth every Monday, Wednesday, and Friday with hemodialysis.   ertapenem  IVPB Commonly known as: INVANZ Inject 500 mg into the vein daily. Indication: Right hand infection First Dose: Yes Last Day of Therapy:  07/06/2020 Labs - Once weekly:  CBC/D and BMP, Labs - Every other week:  ESR and CRP Method of administration: Mini-Bag Plus / Gravity Method of administration may be changed at the discretion of home infusion pharmacist based upon assessment of the patient and/or caregiver's ability to self-administer the medication ordered.   famotidine 20 MG tablet Commonly known as: PEPCID Take 20 mg by mouth 2 (two) times daily as needed for heartburn or indigestion.   feeding supplement (NEPRO CARB  STEADY) Liqd Take 237 mLs by mouth 2 (two) times daily between meals for 7 days. What changed:   when to take this  reasons to take this   ferrous sulfate 325 (65 FE) MG tablet Take 1 tablet (325 mg total) by mouth daily with breakfast.   oxyCODONE-acetaminophen 7.5-325 MG tablet Commonly known as: PERCOCET Take 1 tablet by mouth 4 (four) times daily as needed for pain.   pregabalin 100 MG capsule Commonly known as: LYRICA Take 100 mg by mouth at bedtime.   saccharomyces boulardii 250 MG capsule Commonly known as: FLORASTOR Take 1 capsule (250 mg total) by mouth 2 (two) times daily.   sevelamer carbonate 800 MG tablet Commonly known as: RENVELA Take 3 tablets (2,400 mg total) by mouth 3 (three) times daily with meals.   Tradjenta 5 MG Tabs tablet Generic drug: linagliptin Take 5 mg by mouth daily.            Discharge Care Instructions  (From admission, onward)         Start     Ordered   06/01/20 0000  Change dressing on IV access line weekly and PRN  (  Home infusion instructions - [facility] )        06/01/20 0611         No Known Allergies  Follow-up Information    [name], FNP. Call in 1 day(s).   Specialty: Family Medicine Why: please call for a post hospital follow up appointment Contact information: [address] [place] [address] [phone]        Michel Bickers, MD Follow up in 1 day(s).   Specialty: Infectious Diseases Why: please call for a post hospital follow up appointment Contact information: [place] [place] [address] [phone]        [facility] Follow up.   Specialty: Home Health Services Why: Home health services arranged       [facility] Follow up.   Why: IV antibiotic  therapy arranged               The results of significant diagnostics from this hospitalization (including imaging, microbiology, ancillary and laboratory) are listed  below for reference.    Significant Diagnostic Studies: IR Fluoro Guide CV Line Right  Result Date: 06/01/2020 INDICATION: 59 year old male with a history of osteomyelitis EXAM: IMAGE GUIDED TUNNELED CENTRAL VENOUS CATHETER MEDICATIONS: None ANESTHESIA/SEDATION: None FLUOROSCOPY TIME:  Fluoroscopy Time: 0 minutes 6 seconds (1 mGy). COMPLICATIONS: None PROCEDURE: After written informed consent was obtained, patient was placed in the supine position on angiographic table. Patency of the right internal jugular vein was confirmed with ultrasound with image documentation. Patient was prepped and draped in the usual sterile fashion including the right neck and right superior chest. Using ultrasound guidance, the skin and subcutaneous tissues overlying the right internal jugular vein were generously infiltrated with 1% lidocaine without epinephrine. Using ultrasound guidance, the right internal jugular vein was punctured with a micropuncture needle, and an 018 wire was advanced into the right heart confirming venous access. A small stab incision was made with an 11 blade scalpel. Peel-away sheath was placed over the wire, and then the wire was removed, marking the wire for estimation of internal catheter length. The chest wall was then generously infiltrated with 1% lidocaine for local anesthesia along the tissue tract. Small stab incision was made with 11 blade scalpel, and then the catheter was back tunneled to the puncture site at the right internal jugular vein. Catheter was pulled through the tract, with the catheter amputated at 22 cm. Catheter was advanced through the peel-away sheath, and the peel-away sheath was removed. Final image was stored. The catheter was anchored to the chest wall with 2 retention sutures, and Derma bond was used to seal the right internal jugular vein incision site and at the right chest wall. Patient tolerated the procedure well and remained hemodynamically stable throughout. No  complications were encountered and no significant blood loss was encountered. FINDINGS: After catheter placement, the tip lies within the superior cavoatrial junction. The catheter aspirates and flushes normally and is ready for immediate use. IMPRESSION: Status post image guided double-lumen central venous cuffed catheter. Catheter ready for use. Signed, Dulcy Fanny. Dellia Nims, RPVI [facility] Radiology Electronically Signed   By: Corrie Mckusick D.O.   On: 06/01/2020 09:30   IR US Guide Vasc Access Right  Result Date: 06/01/2020 INDICATION: 59 year old male with a history of osteomyelitis EXAM: IMAGE GUIDED TUNNELED CENTRAL VENOUS CATHETER MEDICATIONS: None ANESTHESIA/SEDATION: None FLUOROSCOPY TIME:  Fluoroscopy Time: 0 minutes 6 seconds (1 mGy). COMPLICATIONS: None PROCEDURE:  After written informed consent was obtained, patient was placed in the supine position on angiographic table. Patency of the right internal jugular vein was confirmed with ultrasound with image documentation. Patient was prepped and draped in the usual sterile fashion including the right neck and right superior chest. Using ultrasound guidance, the skin and subcutaneous tissues overlying the right internal jugular vein were generously infiltrated with 1% lidocaine without epinephrine. Using ultrasound guidance, the right internal jugular vein was punctured with a micropuncture needle, and an 018 wire was advanced into the right heart confirming venous access. A small stab incision was made with an 11 blade scalpel. Peel-away sheath was placed over the wire, and then the wire was removed, marking the wire for estimation of internal catheter length. The chest wall was then generously infiltrated with 1% lidocaine for local anesthesia along the tissue tract. Small stab incision was made with 11 blade scalpel, and then the catheter was back tunneled to the puncture site at the right internal  jugular vein. Catheter was pulled through the tract, with the catheter amputated at 22 cm. Catheter was advanced through the peel-away sheath, and the peel-away sheath was removed. Final image was stored. The catheter was anchored to the chest wall with 2 retention sutures, and Derma bond was used to seal the right internal jugular vein incision site and at the right chest wall. Patient tolerated the procedure well and remained hemodynamically stable throughout. No complications were encountered and no significant blood loss was encountered. FINDINGS: After catheter placement, the tip lies within the superior cavoatrial junction. The catheter aspirates and flushes normally and is ready for immediate use. IMPRESSION: Status post image guided double-lumen central venous cuffed catheter. Catheter ready for use. Signed, Dulcy Fanny. Dellia Nims, RPVI [facility] Radiology Electronically Signed   By: Corrie Mckusick D.O.   On: 06/01/2020 09:30    Microbiology: Recent Results (from the past 240 hour(s))  SARS Coronavirus 2 by RT PCR (hospital order, performed in [facility] hospital lab) Nasopharyngeal Nasopharyngeal Swab     Status: None   Collection Time: 05/26/20 12:19 PM   Specimen: Nasopharyngeal Swab  Result Value Ref Range Status   SARS Coronavirus 2 NEGATIVE NEGATIVE Final    Comment: (NOTE) SARS-CoV-2 target nucleic acids are NOT DETECTED.  The SARS-CoV-2 RNA is generally detectable in upper and lower respiratory specimens during the acute phase of infection. The lowest concentration of SARS-CoV-2 viral copies this assay can detect is 250 copies / mL. A negative result does not preclude SARS-CoV-2 infection and should not be used as the sole basis for treatment or other patient management decisions.  A negative result may occur with improper specimen collection / handling, submission of specimen other than nasopharyngeal swab, presence of viral  mutation(s) within the areas targeted by this assay, and inadequate number of viral copies (<250 copies / mL). A negative result must be combined with clinical observations, patient history, and epidemiological information.  Fact Sheet for Patients:   StrictlyIdeas.no  Fact Sheet for Healthcare Providers: BankingDealers.co.za  This test is not yet approved or  cleared by the [place] FDA and has been authorized for detection and/or diagnosis of SARS-CoV-2 by FDA under an Emergency Use Authorization (EUA).  This EUA will remain in effect (meaning this test can be used) for the duration of the COVID-19 declaration under Section 564(b)(1) of the Act, 21 U.S.C. section 360bbb-3(b)(1), unless the authorization is terminated or revoked sooner.  Performed at [facility], [place] [address] [place], [place] [address]   Aerobic/Anaerobic Culture (surgical/deep wound)     Status: None   Collection Time: 05/26/20  5:33 PM   Specimen: PATH Other; Wound  Result Value Ref Range Status   Specimen Description HAND  Final   Special Requests RIGHT SAMPLE A  Final   Gram Stain   Final    MODERATE WBC PRESENT, PREDOMINANTLY PMN ABUNDANT GRAM NEGATIVE RODS    Culture   Final    ABUNDANT BACTEROIDES FRAGILIS BETA LACTAMASE POSITIVE WITH IN NORMAL SKIN FLORA Performed at [facility], [place] [address], [place], [place] [address]    Report Status 05/29/2020 FINAL  Final  Aerobic/Anaerobic Culture (surgical/deep wound)     Status: None   Collection Time: 05/26/20  6:07 PM   Specimen: PATH Bone biopsy; Tissue  Result Value Ref Range Status   Specimen Description TISSUE RIGHT HAND  Final   Special Requests BONE  Final   Gram Stain   Final    RARE WBC PRESENT, PREDOMINANTLY MONONUCLEAR NO ORGANISMS SEEN    Culture   Final    RARE ALCALIGENES FAECALIS FEW BACTEROIDES FRAGILIS BETA LACTAMASE POSITIVE Performed at [place], [place] [address], [place], [place] [address]    Report Status 05/30/2020 FINAL  Final   Organism ID, Bacteria ALCALIGENES FAECALIS  Final      Susceptibility   Alcaligenes faecalis - MIC*    CEFAZOLIN 32 INTERMEDIATE Intermediate     GENTAMICIN 2 SENSITIVE Sensitive     CIPROFLOXACIN 2 INTERMEDIATE Intermediate     IMIPENEM 0.5 SENSITIVE Sensitive     TRIMETH/SULFA <=20 SENSITIVE Sensitive     * RARE ALCALIGENES FAECALIS  Culture, blood (routine x 2)     Status: None   Collection Time: 05/26/20 10:03 PM   Specimen: BLOOD LEFT HAND  Result Value Ref Range Status   Specimen Description BLOOD LEFT HAND  Final   Special Requests   Final    BOTTLES DRAWN AEROBIC AND ANAEROBIC Blood Culture adequate volume   Culture   Final    NO GROWTH 5 DAYS Performed at [facility], [place] [address], [place], [place] [address]    Report Status 06/01/2020 FINAL  Final  Culture, blood (routine x 2)     Status: None   Collection Time: 05/26/20 10:09 PM   Specimen: BLOOD LEFT ARM  Result Value Ref Range Status   Specimen Description BLOOD LEFT ARM  Final   Special Requests   Final    BOTTLES DRAWN AEROBIC ONLY Blood Culture results may not be optimal due to an inadequate volume of blood received in culture bottles   Culture   Final    NO GROWTH 5 DAYS Performed at [facility], [place] [address], [place], [place] [address]    Report Status 06/01/2020 FINAL  Final  MRSA PCR Screening     Status: None   Collection Time: 05/27/20  6:33 PM   Specimen: Nasopharyngeal  Result Value Ref Range Status   MRSA by PCR NEGATIVE NEGATIVE Final    Comment:        The GeneXpert MRSA Assay (FDA approved for NASAL specimens only), is one component of a comprehensive MRSA colonization surveillance program. It is not intended to diagnose MRSA infection nor to guide or monitor treatment for MRSA infections. Performed at [facility], [place] [address], [place],  [address]      Labs: Basic  Metabolic Panel: Recent Labs  Lab 05/26/20 1315 05/27/20 0402 05/28/20 0352 05/29/20 0457 05/30/20 0454 05/31/20 0458 06/01/20 0357  NA 136   < > 136 138 139 137 137  K 4.1   < > 3.8 4.0 4.3 4.5 3.9  CL 93*   < > 91* 95* 97* 96* 98  CO2 26   < > '28 27 25 23 27  ' GLUCOSE 110*   < > 153* 138* 114* 105* 142*  BUN 48*   < > 31* 21 35* 52* 23  CREATININE 10.15*   < > 7.62* 5.74* 8.55* 9.94* 5.50*  CALCIUM 9.2   < > 8.5* 9.4 9.8 9.8 9.1  MG 2.3  --   --   --   --   --   --   PHOS  --   --   --  5.7*  --   --   --    < > = values in this interval not displayed.   Liver Function Tests: Recent Labs  Lab 05/26/20 1315 05/27/20 0402  AST 15 12*  ALT 17 13  ALKPHOS 70 62  BILITOT 1.0 1.1  PROT 8.0 7.5  ALBUMIN 3.0* 2.6*   No results for input(s): LIPASE, AMYLASE in the last 168 hours. No results for input(s): AMMONIA in the last 168 hours. CBC: Recent Labs  Lab 05/26/20 1315 05/27/20 0402 05/28/20 0352 05/29/20 0457 05/30/20 0454 05/31/20 0458 06/01/20 0357  WBC 11.2*   < > 9.2 8.1 8.2 9.0 11.6*  NEUTROABS 8.0*  --   --   --   --   --   --   HGB 9.3*   < > 9.2* 8.9* 9.4* 8.9* 9.2*  HCT 31.5*   < > 30.9* 30.9* 32.0* 30.5* 30.9*  MCV 78.8*   < > 78.0* 78.4* 79.0* 77.4* 76.9*  PLT 163   < > 167 188 221 261 259   < > = values in this interval not displayed.   Cardiac Enzymes: No results for input(s): CKTOTAL, CKMB, CKMBINDEX, TROPONINI in the last 168 hours. BNP: BNP (last 3 results) Recent Labs    03/14/20 0812 03/15/20 0438 03/16/20 0436  BNP 344.0* 246.0* 158.2*    ProBNP (last 3 results) No results for input(s): PROBNP in the last 8760 hours.  CBG: Recent Labs  Lab 05/30/20 2040 05/31/20 0751 05/31/20 1147 05/31/20 1640 06/01/20 0756  GLUCAP 127* 113* 131* 113* 119*       Signed:  Kayleen Memos, MD [facility] 06/01/2020, 10:06 AM

## 2020-06-03 ENCOUNTER — Emergency Department (HOSPITAL_COMMUNITY)
Admission: EM | Admit: 2020-06-03 | Discharge: 2020-06-03 | Disposition: A | Discharge status: Home / Self Care | Payer: Medicare Other | Attending: Emergency Medicine | Admitting: Emergency Medicine

## 2020-06-03 ENCOUNTER — Emergency Department (HOSPITAL_COMMUNITY): Payer: Medicare Other

## 2020-06-03 ENCOUNTER — Other Ambulatory Visit: Payer: Self-pay

## 2020-06-03 DIAGNOSIS — L7622 Postprocedural hemorrhage and hematoma of skin and subcutaneous tissue following other procedure: Secondary | ICD-10-CM | POA: Diagnosis not present

## 2020-06-03 DIAGNOSIS — Z87891 Personal history of nicotine dependence: Secondary | ICD-10-CM | POA: Diagnosis not present

## 2020-06-03 DIAGNOSIS — E1122 Type 2 diabetes mellitus with diabetic chronic kidney disease: Secondary | ICD-10-CM | POA: Insufficient documentation

## 2020-06-03 DIAGNOSIS — Z79899 Other long term (current) drug therapy: Secondary | ICD-10-CM | POA: Insufficient documentation

## 2020-06-03 DIAGNOSIS — I120 Hypertensive chronic kidney disease with stage 5 chronic kidney disease or end stage renal disease: Secondary | ICD-10-CM | POA: Diagnosis not present

## 2020-06-03 DIAGNOSIS — T82838A Hemorrhage of vascular prosthetic devices, implants and grafts, initial encounter: Secondary | ICD-10-CM | POA: Insufficient documentation

## 2020-06-03 DIAGNOSIS — N186 End stage renal disease: Secondary | ICD-10-CM | POA: Diagnosis not present

## 2020-06-03 DIAGNOSIS — Z89511 Acquired absence of right leg below knee: Secondary | ICD-10-CM | POA: Insufficient documentation

## 2020-06-03 DIAGNOSIS — Z992 Dependence on renal dialysis: Secondary | ICD-10-CM | POA: Diagnosis not present

## 2020-06-03 DIAGNOSIS — Z89512 Acquired absence of left leg below knee: Secondary | ICD-10-CM | POA: Insufficient documentation

## 2020-06-03 LAB — CBC WITH DIFFERENTIAL/PLATELET
Abs Immature Granulocytes: 0.04 10*3/uL (ref 0.00–0.07)
Basophils Absolute: 0.1 10*3/uL (ref 0.0–0.1)
Basophils Relative: 1 %
Eosinophils Absolute: 0.3 10*3/uL (ref 0.0–0.5)
Eosinophils Relative: 3 %
HCT: 29.5 % — ABNORMAL LOW (ref 39.0–52.0)
Hemoglobin: 8.7 g/dL — ABNORMAL LOW (ref 13.0–17.0)
Immature Granulocytes: 0 %
Lymphocytes Relative: 20 %
Lymphs Abs: 1.8 10*3/uL (ref 0.7–4.0)
MCH: 22.7 pg — ABNORMAL LOW (ref 26.0–34.0)
MCHC: 29.5 g/dL — ABNORMAL LOW (ref 30.0–36.0)
MCV: 76.8 fL — ABNORMAL LOW (ref 80.0–100.0)
Monocytes Absolute: 0.9 10*3/uL (ref 0.1–1.0)
Monocytes Relative: 10 %
Neutro Abs: 6 10*3/uL (ref 1.7–7.7)
Neutrophils Relative %: 66 %
Platelets: 306 10*3/uL (ref 150–400)
RBC: 3.84 MIL/uL — ABNORMAL LOW (ref 4.22–5.81)
RDW: 20.5 % — ABNORMAL HIGH (ref 11.5–15.5)
WBC: 9 10*3/uL (ref 4.0–10.5)
nRBC: 0 % (ref 0.0–0.2)

## 2020-06-03 LAB — PROTIME-INR
INR: 1.1 (ref 0.8–1.2)
Prothrombin Time: 14.2 seconds (ref 11.4–15.2)

## 2020-06-03 LAB — BASIC METABOLIC PANEL
Anion gap: 11 (ref 5–15)
BUN: 21 mg/dL (ref 8–23)
CO2: 33 mmol/L — ABNORMAL HIGH (ref 22–32)
Calcium: 9.4 mg/dL (ref 8.9–10.3)
Chloride: 94 mmol/L — ABNORMAL LOW (ref 98–111)
Creatinine, Ser: 5.08 mg/dL — ABNORMAL HIGH (ref 0.61–1.24)
GFR calc Af Amer: 13 mL/min — ABNORMAL LOW (ref >60)
GFR calc non Af Amer: 11 mL/min — ABNORMAL LOW (ref >60)
Glucose, Bld: 146 mg/dL — ABNORMAL HIGH (ref 70–99)
Potassium: 3.5 mmol/L (ref 3.5–5.1)
Sodium: 138 mmol/L (ref 135–145)

## 2020-06-03 MED ORDER — GELATIN ABSORBABLE 12-7 MM EX MISC
1.0000 | Freq: Once | CUTANEOUS | Status: AC
  Administered 2020-06-03: 1 via TOPICAL
  Filled 2020-06-03: qty 1

## 2020-06-03 MED ORDER — TRANEXAMIC ACID FOR EPISTAXIS
500.0000 mg | Freq: Once | TOPICAL | Status: AC
  Administered 2020-06-03: 500 mg via TOPICAL
  Filled 2020-06-03: qty 10

## 2020-06-03 MED ORDER — LIDOCAINE-EPINEPHRINE (PF) 2 %-1:200000 IJ SOLN
20.0000 mL | Freq: Once | INTRAMUSCULAR | Status: AC
  Administered 2020-06-03: 20 mL
  Filled 2020-06-03: qty 20

## 2020-06-03 NOTE — Discharge Instructions (Signed)
Keep dressing in place.  Your nurse should check this PICC line site tomorrow.  If bleeding recurs, hold pressure for 20 minutes.  Bleeding continues report back to the ED.  Return to the ED also with recurrent bleeding, dizziness, lightheadedness, fever, vomiting, other concerns
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
## 2020-06-04 ENCOUNTER — Emergency Department (HOSPITAL_COMMUNITY)
Admission: EM | Admit: 2020-06-04 | Discharge: 2020-06-04 | Disposition: A | Discharge status: Home / Self Care | Payer: No Typology Code available for payment source | Attending: Emergency Medicine | Admitting: Emergency Medicine

## 2020-06-04 ENCOUNTER — Emergency Department (HOSPITAL_COMMUNITY): Payer: No Typology Code available for payment source

## 2020-06-04 ENCOUNTER — Other Ambulatory Visit: Payer: Self-pay

## 2020-06-04 DIAGNOSIS — Z7984 Long term (current) use of oral hypoglycemic drugs: Secondary | ICD-10-CM | POA: Insufficient documentation

## 2020-06-04 DIAGNOSIS — I120 Hypertensive chronic kidney disease with stage 5 chronic kidney disease or end stage renal disease: Secondary | ICD-10-CM | POA: Insufficient documentation

## 2020-06-04 DIAGNOSIS — M25512 Pain in left shoulder: Secondary | ICD-10-CM | POA: Insufficient documentation

## 2020-06-04 DIAGNOSIS — R519 Headache, unspecified: Secondary | ICD-10-CM | POA: Insufficient documentation

## 2020-06-04 DIAGNOSIS — N186 End stage renal disease: Secondary | ICD-10-CM | POA: Insufficient documentation

## 2020-06-04 DIAGNOSIS — Z87891 Personal history of nicotine dependence: Secondary | ICD-10-CM | POA: Diagnosis not present

## 2020-06-04 DIAGNOSIS — Z992 Dependence on renal dialysis: Secondary | ICD-10-CM | POA: Diagnosis not present

## 2020-06-04 DIAGNOSIS — E1122 Type 2 diabetes mellitus with diabetic chronic kidney disease: Secondary | ICD-10-CM | POA: Insufficient documentation

## 2020-06-04 DIAGNOSIS — Y93I9 Activity, other involving external motion: Secondary | ICD-10-CM | POA: Diagnosis not present

## 2020-06-04 DIAGNOSIS — M791 Myalgia, unspecified site: Secondary | ICD-10-CM | POA: Insufficient documentation

## 2020-06-04 DIAGNOSIS — M7918 Myalgia, other site: Secondary | ICD-10-CM

## 2020-06-04 DIAGNOSIS — Z79899 Other long term (current) drug therapy: Secondary | ICD-10-CM | POA: Diagnosis not present

## 2020-06-04 DIAGNOSIS — Y92410 Unspecified street and highway as the place of occurrence of the external cause: Secondary | ICD-10-CM | POA: Insufficient documentation

## 2020-06-04 DIAGNOSIS — M542 Cervicalgia: Secondary | ICD-10-CM | POA: Diagnosis present

## 2020-06-04 DIAGNOSIS — Y999 Unspecified external cause status: Secondary | ICD-10-CM | POA: Insufficient documentation

## 2020-06-04 MED ORDER — HYDROMORPHONE HCL 1 MG/ML IJ SOLN
0.5000 mg | Freq: Once | INTRAMUSCULAR | Status: AC
  Administered 2020-06-04: 0.5 mg via INTRAVENOUS
  Filled 2020-06-04: qty 1

## 2020-06-04 MED ORDER — OXYCODONE-ACETAMINOPHEN 5-325 MG PO TABS
1.0000 | ORAL_TABLET | Freq: Once | ORAL | Status: AC
  Administered 2020-06-04: 1 via ORAL
  Filled 2020-06-04: qty 1

## 2020-06-04 MED ORDER — CARVEDILOL 3.125 MG PO TABS
12.5000 mg | ORAL_TABLET | Freq: Once | ORAL | Status: AC
  Administered 2020-06-04: 12.5 mg via ORAL
  Filled 2020-06-04: qty 4

## 2020-06-04 NOTE — ED Notes (Signed)
Discharge instructions discussed with pt. Pt verbalized understanding with no questions at this time. Pt to go home with friend 
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
## 2020-06-04 NOTE — ED Notes (Signed)
C Collar removed by EDP
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
## 2020-06-10 ENCOUNTER — Other Ambulatory Visit: Payer: Self-pay

## 2020-06-10 ENCOUNTER — Ambulatory Visit (INDEPENDENT_AMBULATORY_CARE_PROVIDER_SITE_OTHER): Payer: Medicare Other | Admitting: Internal Medicine

## 2020-06-10 ENCOUNTER — Encounter: Payer: Self-pay | Admitting: Internal Medicine

## 2020-06-10 DIAGNOSIS — L089 Local infection of the skin and subcutaneous tissue, unspecified: Secondary | ICD-10-CM | POA: Diagnosis present

## 2020-06-10 NOTE — Assessment & Plan Note (Signed)
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  I plan on at least 6 weeks of IV meropenem therapy through 07/06/2020.  He will follow-up here on 07/05/2020.
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
## 2020-07-05 ENCOUNTER — Telehealth: Payer: Self-pay

## 2020-07-05 NOTE — Telephone Encounter (Signed)
Patient called office today regarding follow up appointment. States he has dialysis M,W,F at 11:20-4:30. Patient unable to do e-visit since he rides bus to dialysis around 10.  Will forward message to MD to advise on appointment.  [place]

## 2020-07-06 NOTE — Telephone Encounter (Signed)
Please see if he can come at 845 on 07/14/2020.

## 2020-07-06 NOTE — Telephone Encounter (Signed)
Spoke with patient about appointment. He is okay with coming tomorrow since he has dialysis close to the office.   Would like note in chart with dialysis information.  [facility] P:  [phone] Schedule is going to change next week. Will be going M,W,F 9-2. [place]

## 2020-07-07 ENCOUNTER — Other Ambulatory Visit: Payer: Self-pay

## 2020-07-07 ENCOUNTER — Ambulatory Visit (INDEPENDENT_AMBULATORY_CARE_PROVIDER_SITE_OTHER): Payer: Medicare Other | Admitting: Internal Medicine

## 2020-07-07 ENCOUNTER — Telehealth: Payer: Self-pay

## 2020-07-07 ENCOUNTER — Ambulatory Visit: Payer: Medicare Other | Admitting: Internal Medicine

## 2020-07-07 ENCOUNTER — Encounter: Payer: Self-pay | Admitting: Internal Medicine

## 2020-07-07 DIAGNOSIS — L089 Local infection of the skin and subcutaneous tissue, unspecified: Secondary | ICD-10-CM | POA: Diagnosis not present

## 2020-07-07 NOTE — Telephone Encounter (Signed)
Per Dr. Megan Salon patient will need port removed at [facility]. Dr. Megan Salon has also placed the order in Epic. I have called and left a voicemail for [place] with [facility] to call me back to get the patient scheduled. Rollie Hynek T Brooks Sailors

## 2020-07-07 NOTE — Assessment & Plan Note (Signed)
He is improving and his inflammatory markers have now normalized after surgery and 6 weeks of IV antibiotic therapy.  I will discontinue ertapenem now and have his central line removed.  He will follow-up in 4 weeks.
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
## 2020-07-08 NOTE — Telephone Encounter (Signed)
Spoke to [place] with IR and patient is scheduled for 07/13/20 at 10:00am and arrive at 9:45 am. Patient's wife Neoma Laming) has been informed of patient's appoinment and arrival time. Neoma Laming verbalized understanding and directions given to her for appointment location.  [name]

## 2020-07-09 ENCOUNTER — Ambulatory Visit (HOSPITAL_COMMUNITY): Payer: No Typology Code available for payment source

## 2020-07-13 ENCOUNTER — Ambulatory Visit (HOSPITAL_COMMUNITY)
Admission: RE | Admit: 2020-07-13 | Discharge: 2020-07-13 | Disposition: A | Discharge status: Home / Self Care | Payer: Medicare Other | Source: Ambulatory Visit | Attending: Internal Medicine | Admitting: Internal Medicine

## 2020-07-13 ENCOUNTER — Other Ambulatory Visit: Payer: Self-pay

## 2020-07-13 DIAGNOSIS — Z452 Encounter for adjustment and management of vascular access device: Secondary | ICD-10-CM | POA: Diagnosis present

## 2020-07-13 DIAGNOSIS — L089 Local infection of the skin and subcutaneous tissue, unspecified: Secondary | ICD-10-CM | POA: Diagnosis not present

## 2020-07-13 HISTORY — PX: IR REMOVAL TUN CV CATH W/O FL: IMG2289

## 2020-07-13 MED ORDER — CHLORHEXIDINE GLUCONATE 4 % EX LIQD
CUTANEOUS | Status: AC
  Filled 2020-07-13: qty 15

## 2020-07-13 MED ORDER — LIDOCAINE HCL 1 % IJ SOLN
INTRAMUSCULAR | Status: AC
  Filled 2020-07-13: qty 20

## 2020-07-13 MED ORDER — LIDOCAINE HCL (PF) 1 % IJ SOLN
INTRAMUSCULAR | Status: AC | PRN
  Administered 2020-07-13: 10 mL

## 2020-07-13 NOTE — Procedures (Signed)
Successful removal of right IJ tunneled central venous catheter.   After obtaining consent and performing a time-out, the right upper chest was prepped and draped in the normal sterile fashion. The heparin was removed from both ports. Catheter easily removed without the use of lidocaine or blunt dissection. Pressure was held until hemostasis was obtained. A sterile dressing was applied. The patient tolerated the procedure well with no immediate complications.   Soyla Dryer, [place] [phone] 07/13/2020, 10:18 AM

## 2020-07-14 ENCOUNTER — Ambulatory Visit (HOSPITAL_COMMUNITY): Payer: No Typology Code available for payment source

## 2020-07-21 ENCOUNTER — Emergency Department (HOSPITAL_COMMUNITY): Payer: Medicare Other

## 2020-07-21 ENCOUNTER — Emergency Department (HOSPITAL_COMMUNITY)
Admission: EM | Admit: 2020-07-21 | Discharge: 2020-07-21 | Disposition: A | Discharge status: Home / Self Care | Payer: Medicare Other | Attending: Emergency Medicine | Admitting: Emergency Medicine

## 2020-07-21 ENCOUNTER — Encounter (HOSPITAL_COMMUNITY): Payer: Self-pay | Admitting: *Deleted

## 2020-07-21 ENCOUNTER — Other Ambulatory Visit: Payer: Self-pay

## 2020-07-21 DIAGNOSIS — Z87891 Personal history of nicotine dependence: Secondary | ICD-10-CM | POA: Insufficient documentation

## 2020-07-21 DIAGNOSIS — E1140 Type 2 diabetes mellitus with diabetic neuropathy, unspecified: Secondary | ICD-10-CM | POA: Diagnosis not present

## 2020-07-21 DIAGNOSIS — I120 Hypertensive chronic kidney disease with stage 5 chronic kidney disease or end stage renal disease: Secondary | ICD-10-CM | POA: Diagnosis not present

## 2020-07-21 DIAGNOSIS — Z79899 Other long term (current) drug therapy: Secondary | ICD-10-CM | POA: Diagnosis not present

## 2020-07-21 DIAGNOSIS — Z20822 Contact with and (suspected) exposure to covid-19: Secondary | ICD-10-CM | POA: Insufficient documentation

## 2020-07-21 DIAGNOSIS — N186 End stage renal disease: Secondary | ICD-10-CM | POA: Diagnosis not present

## 2020-07-21 DIAGNOSIS — R4182 Altered mental status, unspecified: Secondary | ICD-10-CM | POA: Insufficient documentation

## 2020-07-21 DIAGNOSIS — R419 Unspecified symptoms and signs involving cognitive functions and awareness: Secondary | ICD-10-CM

## 2020-07-21 LAB — COMPREHENSIVE METABOLIC PANEL
ALT: 15 U/L (ref 0–44)
AST: 27 U/L (ref 15–41)
Albumin: 3.7 g/dL (ref 3.5–5.0)
Alkaline Phosphatase: 75 U/L (ref 38–126)
Anion gap: 20 — ABNORMAL HIGH (ref 5–15)
BUN: 22 mg/dL (ref 8–23)
CO2: 20 mmol/L — ABNORMAL LOW (ref 22–32)
Calcium: 8.9 mg/dL (ref 8.9–10.3)
Chloride: 94 mmol/L — ABNORMAL LOW (ref 98–111)
Creatinine, Ser: 7.13 mg/dL — ABNORMAL HIGH (ref 0.61–1.24)
GFR calc Af Amer: 9 mL/min — ABNORMAL LOW (ref >60)
GFR calc non Af Amer: 8 mL/min — ABNORMAL LOW (ref >60)
Glucose, Bld: 134 mg/dL — ABNORMAL HIGH (ref 70–99)
Potassium: 4.1 mmol/L (ref 3.5–5.1)
Sodium: 134 mmol/L — ABNORMAL LOW (ref 135–145)
Total Bilirubin: 0.5 mg/dL (ref 0.3–1.2)
Total Protein: 8.4 g/dL — ABNORMAL HIGH (ref 6.5–8.1)

## 2020-07-21 LAB — CBC WITH DIFFERENTIAL/PLATELET
Abs Immature Granulocytes: 0 10*3/uL (ref 0.00–0.07)
Basophils Absolute: 0 10*3/uL (ref 0.0–0.1)
Basophils Relative: 0 %
Eosinophils Absolute: 0.4 10*3/uL (ref 0.0–0.5)
Eosinophils Relative: 3 %
HCT: 41.2 % (ref 39.0–52.0)
Hemoglobin: 12.6 g/dL — ABNORMAL LOW (ref 13.0–17.0)
Lymphocytes Relative: 17 %
Lymphs Abs: 2.1 10*3/uL (ref 0.7–4.0)
MCH: 23.3 pg — ABNORMAL LOW (ref 26.0–34.0)
MCHC: 30.6 g/dL (ref 30.0–36.0)
MCV: 76.3 fL — ABNORMAL LOW (ref 80.0–100.0)
Monocytes Absolute: 0.4 10*3/uL (ref 0.1–1.0)
Monocytes Relative: 3 %
Neutro Abs: 9.5 10*3/uL — ABNORMAL HIGH (ref 1.7–7.7)
Neutrophils Relative %: 77 %
Platelets: 150 10*3/uL (ref 150–400)
RBC: 5.4 MIL/uL (ref 4.22–5.81)
RDW: 21.2 % — ABNORMAL HIGH (ref 11.5–15.5)
WBC: 12.3 10*3/uL — ABNORMAL HIGH (ref 4.0–10.5)
nRBC: 0 % (ref 0.0–0.2)
nRBC: 0 /100 WBC

## 2020-07-21 LAB — PROTIME-INR
INR: 1.1 (ref 0.8–1.2)
Prothrombin Time: 13.3 seconds (ref 11.4–15.2)

## 2020-07-21 LAB — SARS CORONAVIRUS 2 BY RT PCR (HOSPITAL ORDER, PERFORMED IN ~~LOC~~ HOSPITAL LAB): SARS Coronavirus 2: NEGATIVE

## 2020-07-21 LAB — APTT: aPTT: 37 seconds — ABNORMAL HIGH (ref 24–36)

## 2020-07-21 LAB — ETHANOL: Alcohol, Ethyl (B): <10 mg/dL (ref <10)

## 2020-07-21 LAB — AMMONIA: Ammonia: 16 umol/L (ref 9–35)

## 2020-07-21 LAB — LACTIC ACID, PLASMA: Lactic Acid, Venous: 1.2 mmol/L (ref 0.5–1.9)

## 2020-07-21 LAB — CBG MONITORING, ED: Glucose-Capillary: 156 mg/dL — ABNORMAL HIGH (ref 70–99)

## 2020-07-21 MED ORDER — NALOXONE HCL 0.4 MG/ML IJ SOLN: 0.4000 mg | Freq: Once | INTRAMUSCULAR | Status: DC

## 2020-07-21 NOTE — ED Provider Notes (Addendum)
Sign out note  59 y/o male presents with lethargy, confusion from dialysis. Vitals stable. Narcan ordered. Labs, CT ordered and pending.  4:25 PM received sign out from [place] - f/u labs, CT, reassess patient  6:30 PM reassessed patient, has returned to baseline mental status, awake, alert, fluent conversation, he reports he is unsure what exactly happened today but feels back to normal self; reviewed with wife over the phone, no additional concerns; she can come pick him up ~7:30 PM  7:49 PM reassessed patient, remains stable, no ongoing complaints, reviewed work up, return precautions and rec close recheck with PCP   MDM: 59 year old dialysis patient had presented with lethargy, confusion.  Oriented x1 on arrival in ER, somewhat lethargic.  Extensive lab work including CBC, CMP, ammonia, lactic acid were all within normal limits.  CT head negative for acute process.  Dr. Jeannine Kitten had initially ordered Narcan but patient did not ultimately receive this.  He spontaneously had complete resolution of symptoms and complete return to baseline mental status.  He was observed in ER and had no additional changes.  Unclear exact cause of this episode, may have been related to his chronic opiate use.  Given the reassuring work-up and reassuring current clinical appearance, believe patient can be discharged and managed in the outpatient setting.  Recommended patient decrease or limit additional doses of his opiate medications.  Strongly recommend close recheck with his PCP.  Reviewed return precautions with patient and wife, discharged home.    Lucrezia Starch, MD 07/21/20 [phone]
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
## 2020-07-21 NOTE — ED Notes (Signed)
Patient verbalizes understanding of discharge instructions. Opportunity for questioning and answers were provided. Armband removed by staff, pt discharged from ED to home with wife

## 2020-07-21 NOTE — Discharge Instructions (Addendum)
Please call your primary care doctor to get an appointment to be seen tomorrow for a close recheck.  If you have any episodes of feeling confused, any chest pain, difficulty breathing, fever or other new concerning symptom, please return to the emergency room for reassessment.

## 2020-07-21 NOTE — ED Triage Notes (Signed)
Pt here via [facility] from dialysis.  The RN stated she stepped away for a break and when she returned the pt was unresponsive.  Per [facility], pt was diaphoretic and cool.   BP was reported to [facility] at 90 sys, but they found bp to be sys 120, hr 90, cbg 186, spot 90.Marland Kitchen  Pt alert to self only.  Lethargic.

## 2020-07-26 LAB — CULTURE, BLOOD (ROUTINE X 2)
Culture: NO GROWTH
Special Requests: ADEQUATE

## 2020-08-04 ENCOUNTER — Ambulatory Visit: Payer: Medicare Other | Admitting: Internal Medicine

## 2020-09-06 ENCOUNTER — Other Ambulatory Visit: Payer: Self-pay | Admitting: Orthopedic Surgery

## 2020-09-06 ENCOUNTER — Ambulatory Visit (HOSPITAL_COMMUNITY)
Admission: RE | Admit: 2020-09-06 | Discharge: 2020-09-06 | Disposition: A | Discharge status: Home / Self Care | Payer: Medicare Other | Attending: Orthopedic Surgery | Admitting: Orthopedic Surgery

## 2020-09-06 ENCOUNTER — Encounter (HOSPITAL_COMMUNITY)
Admission: RE | Disposition: A | Discharge status: Home / Self Care | Payer: Self-pay | Source: Home / Self Care | Attending: Orthopedic Surgery

## 2020-09-06 ENCOUNTER — Inpatient Hospital Stay (HOSPITAL_COMMUNITY): Payer: Medicare Other | Admitting: Anesthesiology

## 2020-09-06 ENCOUNTER — Encounter (HOSPITAL_COMMUNITY): Payer: Self-pay | Admitting: Orthopedic Surgery

## 2020-09-06 ENCOUNTER — Other Ambulatory Visit: Payer: Self-pay

## 2020-09-06 DIAGNOSIS — E1122 Type 2 diabetes mellitus with diabetic chronic kidney disease: Secondary | ICD-10-CM | POA: Insufficient documentation

## 2020-09-06 DIAGNOSIS — Z95828 Presence of other vascular implants and grafts: Secondary | ICD-10-CM | POA: Insufficient documentation

## 2020-09-06 DIAGNOSIS — Z833 Family history of diabetes mellitus: Secondary | ICD-10-CM | POA: Insufficient documentation

## 2020-09-06 DIAGNOSIS — Z992 Dependence on renal dialysis: Secondary | ICD-10-CM | POA: Diagnosis not present

## 2020-09-06 DIAGNOSIS — I120 Hypertensive chronic kidney disease with stage 5 chronic kidney disease or end stage renal disease: Secondary | ICD-10-CM | POA: Diagnosis not present

## 2020-09-06 DIAGNOSIS — B1920 Unspecified viral hepatitis C without hepatic coma: Secondary | ICD-10-CM | POA: Insufficient documentation

## 2020-09-06 DIAGNOSIS — D649 Anemia, unspecified: Secondary | ICD-10-CM | POA: Insufficient documentation

## 2020-09-06 DIAGNOSIS — Z8249 Family history of ischemic heart disease and other diseases of the circulatory system: Secondary | ICD-10-CM | POA: Insufficient documentation

## 2020-09-06 DIAGNOSIS — E1140 Type 2 diabetes mellitus with diabetic neuropathy, unspecified: Secondary | ICD-10-CM | POA: Insufficient documentation

## 2020-09-06 DIAGNOSIS — Z79899 Other long term (current) drug therapy: Secondary | ICD-10-CM | POA: Diagnosis not present

## 2020-09-06 DIAGNOSIS — K219 Gastro-esophageal reflux disease without esophagitis: Secondary | ICD-10-CM | POA: Insufficient documentation

## 2020-09-06 DIAGNOSIS — Z89512 Acquired absence of left leg below knee: Secondary | ICD-10-CM | POA: Diagnosis not present

## 2020-09-06 DIAGNOSIS — E11622 Type 2 diabetes mellitus with other skin ulcer: Secondary | ICD-10-CM | POA: Diagnosis present

## 2020-09-06 DIAGNOSIS — Z794 Long term (current) use of insulin: Secondary | ICD-10-CM | POA: Insufficient documentation

## 2020-09-06 DIAGNOSIS — L98494 Non-pressure chronic ulcer of skin of other sites with necrosis of bone: Secondary | ICD-10-CM | POA: Insufficient documentation

## 2020-09-06 DIAGNOSIS — Z20822 Contact with and (suspected) exposure to covid-19: Secondary | ICD-10-CM | POA: Insufficient documentation

## 2020-09-06 DIAGNOSIS — Z87891 Personal history of nicotine dependence: Secondary | ICD-10-CM | POA: Diagnosis not present

## 2020-09-06 DIAGNOSIS — Z89511 Acquired absence of right leg below knee: Secondary | ICD-10-CM | POA: Insufficient documentation

## 2020-09-06 DIAGNOSIS — N186 End stage renal disease: Secondary | ICD-10-CM | POA: Insufficient documentation

## 2020-09-06 DIAGNOSIS — E1151 Type 2 diabetes mellitus with diabetic peripheral angiopathy without gangrene: Secondary | ICD-10-CM | POA: Diagnosis not present

## 2020-09-06 HISTORY — PX: AMPUTATION: SHX166

## 2020-09-06 LAB — POCT I-STAT, CHEM 8
BUN: 48 mg/dL — ABNORMAL HIGH (ref 8–23)
Calcium, Ion: 1.11 mmol/L — ABNORMAL LOW (ref 1.15–1.40)
Chloride: 103 mmol/L (ref 98–111)
Creatinine, Ser: 12.6 mg/dL — ABNORMAL HIGH (ref 0.61–1.24)
Glucose, Bld: 87 mg/dL (ref 70–99)
HCT: 34 % — ABNORMAL LOW (ref 39.0–52.0)
Hemoglobin: 11.6 g/dL — ABNORMAL LOW (ref 13.0–17.0)
Potassium: 3.7 mmol/L (ref 3.5–5.1)
Sodium: 139 mmol/L (ref 135–145)
TCO2: 20 mmol/L — ABNORMAL LOW (ref 22–32)

## 2020-09-06 LAB — SARS CORONAVIRUS 2 BY RT PCR (HOSPITAL ORDER, PERFORMED IN ~~LOC~~ HOSPITAL LAB): SARS Coronavirus 2: NEGATIVE

## 2020-09-06 LAB — GLUCOSE, CAPILLARY
Glucose-Capillary: 83 mg/dL (ref 70–99)
Glucose-Capillary: 74 mg/dL (ref 70–99)

## 2020-09-06 SURGERY — AMPUTATION DIGIT
Anesthesia: General | Laterality: Left

## 2020-09-06 MED ORDER — SODIUM CHLORIDE 0.9 % IV SOLN: INTRAVENOUS | Status: DC

## 2020-09-06 MED ORDER — CHLORHEXIDINE GLUCONATE 0.12 % MT SOLN: 15.0000 mL | Freq: Once | OROMUCOSAL | Status: AC

## 2020-09-06 MED ORDER — OXYCODONE HCL 5 MG PO TABS: 5.0000 mg | ORAL_TABLET | Freq: Once | ORAL | Status: DC | PRN

## 2020-09-06 MED ORDER — ONDANSETRON HCL 4 MG/2ML IJ SOLN
INTRAMUSCULAR | Status: AC
  Filled 2020-09-06: qty 2

## 2020-09-06 MED ORDER — OXYCODONE HCL 5 MG/5ML PO SOLN: 5.0000 mg | Freq: Once | ORAL | Status: DC | PRN

## 2020-09-06 MED ORDER — BUPIVACAINE HCL (PF) 0.25 % IJ SOLN
INTRAMUSCULAR | Status: AC
  Filled 2020-09-06: qty 10

## 2020-09-06 MED ORDER — OXYCODONE HCL 5 MG PO TABS: ORAL_TABLET | ORAL | 0 refills | Status: DC

## 2020-09-06 MED ORDER — CEFAZOLIN SODIUM-DEXTROSE 2-4 GM/100ML-% IV SOLN
2.0000 g | INTRAVENOUS | Status: AC
  Administered 2020-09-06: 2 g via INTRAVENOUS

## 2020-09-06 MED ORDER — FENTANYL CITRATE (PF) 100 MCG/2ML IJ SOLN: 25.0000 ug | INTRAMUSCULAR | Status: DC | PRN

## 2020-09-06 MED ORDER — LIDOCAINE 2% (20 MG/ML) 5 ML SYRINGE
INTRAMUSCULAR | Status: DC | PRN
  Administered 2020-09-06: 60 mg via INTRAVENOUS

## 2020-09-06 MED ORDER — CEFAZOLIN SODIUM-DEXTROSE 2-4 GM/100ML-% IV SOLN
INTRAVENOUS | Status: AC
  Filled 2020-09-06: qty 100

## 2020-09-06 MED ORDER — PROPOFOL 10 MG/ML IV BOLUS
INTRAVENOUS | Status: DC | PRN
  Administered 2020-09-06: 120 mg via INTRAVENOUS

## 2020-09-06 MED ORDER — 0.9 % SODIUM CHLORIDE (POUR BTL) OPTIME
TOPICAL | Status: DC | PRN
  Administered 2020-09-06: 1000 mL

## 2020-09-06 MED ORDER — ONDANSETRON HCL 4 MG/2ML IJ SOLN: 4.0000 mg | Freq: Once | INTRAMUSCULAR | Status: DC | PRN

## 2020-09-06 MED ORDER — CHLORHEXIDINE GLUCONATE 0.12 % MT SOLN
OROMUCOSAL | Status: AC
  Administered 2020-09-06: 15 mL via OROMUCOSAL
  Filled 2020-09-06: qty 15

## 2020-09-06 MED ORDER — FENTANYL CITRATE (PF) 250 MCG/5ML IJ SOLN
INTRAMUSCULAR | Status: AC
  Filled 2020-09-06: qty 5

## 2020-09-06 MED ORDER — ONDANSETRON HCL 4 MG/2ML IJ SOLN
INTRAMUSCULAR | Status: DC | PRN
  Administered 2020-09-06: 4 mg via INTRAVENOUS

## 2020-09-06 MED ORDER — FENTANYL CITRATE (PF) 250 MCG/5ML IJ SOLN
INTRAMUSCULAR | Status: DC | PRN
Start: 2020-09-06 — End: 2020-09-06
  Administered 2020-09-06: 50 ug via INTRAVENOUS

## 2020-09-06 MED ORDER — BUPIVACAINE HCL 0.25 % IJ SOLN
INTRAMUSCULAR | Status: DC | PRN
  Administered 2020-09-06: 10 mL

## 2020-09-06 MED ORDER — MIDAZOLAM HCL 2 MG/2ML IJ SOLN
INTRAMUSCULAR | Status: AC
  Filled 2020-09-06: qty 2

## 2020-09-06 MED ORDER — ORAL CARE MOUTH RINSE: 15.0000 mL | Freq: Once | OROMUCOSAL | Status: AC

## 2020-09-06 SURGICAL SUPPLY — 39 items
BLADE LONG MED 31MMX9MM (MISCELLANEOUS)
BLADE LONG MED 31X9 (MISCELLANEOUS) IMPLANT
BNDG CMPR 9X4 STRL LF SNTH (GAUZE/BANDAGES/DRESSINGS) ×2
BNDG COHESIVE 1X5 TAN STRL LF (GAUZE/BANDAGES/DRESSINGS) ×3 IMPLANT
BNDG COHESIVE 2X5 TAN STRL LF (GAUZE/BANDAGES/DRESSINGS) ×3 IMPLANT
BNDG ESMARK 4X9 LF (GAUZE/BANDAGES/DRESSINGS) ×6 IMPLANT
BNDG GAUZE ELAST 4 BULKY (GAUZE/BANDAGES/DRESSINGS) ×3 IMPLANT
CORD BIPOLAR FORCEPS 12FT (ELECTRODE) ×3 IMPLANT
COVER WAND RF STERILE (DRAPES) IMPLANT
CUFF TOURN SGL QUICK 18X4 (TOURNIQUET CUFF) ×3 IMPLANT
CUFF TOURN SGL QUICK 24 (TOURNIQUET CUFF)
CUFF TRNQT CYL 24X4X16.5-23 (TOURNIQUET CUFF) IMPLANT
DRSG XEROFORM 1X8 (GAUZE/BANDAGES/DRESSINGS) ×3 IMPLANT
DURAPREP 26ML APPLICATOR (WOUND CARE) ×3 IMPLANT
GAUZE SPONGE 4X4 12PLY STRL (GAUZE/BANDAGES/DRESSINGS) ×3 IMPLANT
GAUZE XEROFORM 1X8 LF (GAUZE/BANDAGES/DRESSINGS) ×3 IMPLANT
GLOVE BIO SURGEON STRL SZ7.5 (GLOVE) ×3 IMPLANT
GLOVE BIOGEL PI IND STRL 8 (GLOVE) ×1 IMPLANT
GLOVE BIOGEL PI INDICATOR 8 (GLOVE) ×2
GOWN STRL REUS W/ TWL LRG LVL3 (GOWN DISPOSABLE) ×1 IMPLANT
GOWN STRL REUS W/ TWL XL LVL3 (GOWN DISPOSABLE) ×1 IMPLANT
GOWN STRL REUS W/TWL LRG LVL3 (GOWN DISPOSABLE) ×3
GOWN STRL REUS W/TWL XL LVL3 (GOWN DISPOSABLE) ×3
KIT BASIN OR (CUSTOM PROCEDURE TRAY) ×3 IMPLANT
KIT TURNOVER KIT B (KITS) ×3 IMPLANT
NEEDLE HYPO 25GX1X1/2 BEV (NEEDLE) IMPLANT
NS IRRIG 1000ML POUR BTL (IV SOLUTION) ×3 IMPLANT
PACK ORTHO EXTREMITY (CUSTOM PROCEDURE TRAY) ×3 IMPLANT
PAD ARMBOARD 7.5X6 YLW CONV (MISCELLANEOUS) ×6 IMPLANT
PAD CAST 4YDX4 CTTN HI CHSV (CAST SUPPLIES) IMPLANT
PADDING CAST COTTON 4X4 STRL (CAST SUPPLIES)
SPECIMEN JAR SMALL (MISCELLANEOUS) ×3 IMPLANT
SUT CHROMIC 6 0 PS 4 (SUTURE) IMPLANT
SUT MON AB 2-0 CT1 36 (SUTURE) ×3 IMPLANT
SUT MON AB 5-0 PS2 18 (SUTURE) IMPLANT
SUT VICRYL 4-0 PS2 18IN ABS (SUTURE) ×3 IMPLANT
SYR CONTROL 10ML LL (SYRINGE) IMPLANT
TOWEL GREEN STERILE (TOWEL DISPOSABLE) ×3 IMPLANT
UNDERPAD 30X36 HEAVY ABSORB (UNDERPADS AND DIAPERS) ×3 IMPLANT

## 2020-09-06 NOTE — Anesthesia Procedure Notes (Addendum)
Procedure Name: LMA Insertion Date/Time: 09/06/2020 3:16 PM Performed by: Janace Litten, CRNA Pre-anesthesia Checklist: Patient identified, Emergency Drugs available, Suction available and Patient being monitored Patient Re-evaluated:Patient Re-evaluated prior to induction Oxygen Delivery Method: Circle System Utilized Preoxygenation: Pre-oxygenation with 100% oxygen Induction Type: IV induction Ventilation: Mask ventilation without difficulty LMA: LMA inserted LMA Size: 5.0 Number of attempts: 1 Airway Equipment and Method: Bite block Placement Confirmation: positive ETCO2 Tube secured with: Tape Dental Injury: Teeth and Oropharynx as per pre-operative assessment

## 2020-09-06 NOTE — Op Note (Addendum)
NAME: [name] MEDICAL RECORD NO: [id number] DATE OF BIRTH: 1961-11-20 FACILITY: [facility] LOCATION: [facility] OR PHYSICIAN: Tennis Must, MD   OPERATIVE REPORT   DATE OF PROCEDURE: 09/06/20    PREOPERATIVE DIAGNOSIS:   Left index finger necrosis   POSTOPERATIVE DIAGNOSIS:   Left index finger necrosis   PROCEDURE:   Amputation left index finger at MP joint   SURGEON:  Leanora Cover, M.D.   ASSISTANT: none   ANESTHESIA:  General   INTRAVENOUS FLUIDS:  Per anesthesia flow sheet.   ESTIMATED BLOOD LOSS:  Minimal.   COMPLICATIONS:  None.   SPECIMENS:   Left index finger to pathology for gross exam   TOURNIQUET TIME:   Left wrist Esmarch bandage approximately 50 minutes   DISPOSITION:  Stable to PACU.   INDICATIONS: 59 year old male with end-stage renal disease on dialysis with necrosis of distal aspect left index finger previous had previous amputations on the right hand for necrosis.  He wishes to proceed with amputation left index finger at the level of the MP joint. Risks, benefits and alternatives of surgery were discussed including the risks of blood loss, infection, damage to nerves, vessels, tendons, ligaments, bone for surgery, need for additional surgery, complications with wound healing, continued pain, stiffness.  He voiced understanding of these risks and elected to proceed.  OPERATIVE COURSE:  After being identified preoperatively by myself,  the patient and I agreed on the procedure and site of the procedure.  The surgical site was marked.  Surgical consent had been signed. He was given IV antibiotics as preoperative antibiotic prophylaxis. He was transferred to the operating room and placed on the operating table in supine position with the Left upper extremity on an arm board.  General anesthesia was induced by the anesthesiologist.  Left upper extremity was prepped and draped in normal sterile orthopedic fashion.  A surgical pause was performed between the surgeons,  anesthesia, and operating room staff and all were in agreement as to the patient, procedure, and site of procedure.  An Esmarch bandage at the wrist was used as a tourniquet.    Incision was made on the index finger keep a skin flap of the volar tissues at the proximal phalanx.  This was carried into subcutaneous tissues by spreading technique.  Bipolar electrocautery was used to obtain hemostasis.  The radial and ulnar digital nerve and artery were identified.  The nerve was placed under traction bipolar and allowed to retract.  The artery was tied with a 4-0 Vicryl suture and transected and allowed to retract.  The soft tissues were mobilized.  The finger was amputated through the joint.  Was sent to pathology for gross examination.  The wound was copiously irrigated with sterile saline.  Skin edges were reapproximated with 5-0 Monocryl suture in an interrupted fashion.  Good reapproximation was obtained with overall acceptable contour.  The wound was injected with quarter percent plain Marcaine to aid in postoperative analgesia.  It was dressed with sterile Xeroform 4 x 4's and wrapped with a Coban dressing lightly.  The Esmarch bandage was removed at approximately 50 minutes. Fingertips were pink with brisk capillary refill after deflation of tourniquet.  The operative  drapes were broken down.  The patient was awoken from anesthesia safely.  He was transferred back to the stretcher and taken to PACU in stable condition.  I will see him back in the office in 1 week for postoperative followup.  I will give him a prescription for oxycodone  5 mg 1-2 p.o. every 6 hours as needed pain dispense #15 and will continue on Augmentin 875 mg p.o. twice daily x7 days.   Leanora Cover, MD Electronically signed, 09/06/20

## 2020-09-06 NOTE — Transfer of Care (Signed)
Immediate Anesthesia Transfer of Care Note  Patient: Galvin A [place]mon  Procedure(s) Performed: AMPUTATION DIGIT FOREFINGER (Left )  Patient Location: PACU  Anesthesia Type:General  Level of Consciousness: drowsy and patient cooperative  Airway & Oxygen Therapy: Patient Spontanous Breathing  Post-op Assessment: Report given to RN and Post -op Vital signs reviewed and stable  Post vital signs: Reviewed and stable  Last Vitals:  Vitals Value Taken Time  BP 146/83 09/06/20 1629  Temp 36.8 C 09/06/20 1628  Pulse    Resp 8 09/06/20 1629  SpO2    Vitals shown include unvalidated device data.  Last Pain:  Vitals:   09/06/20 1351  TempSrc:   PainSc: 10-Worst pain ever      Patients Stated Pain Goal: 3 (03/83/33 8329)  Complications: No complications documented.

## 2020-09-06 NOTE — Anesthesia Postprocedure Evaluation (Signed)
Anesthesia Post Note  Patient: [name]  Procedure(s) Performed: AMPUTATION DIGIT FOREFINGER (Left )     Patient location during evaluation: PACU Anesthesia Type: General Level of consciousness: awake and alert Pain management: pain level controlled Vital Signs Assessment: post-procedure vital signs reviewed and stable Respiratory status: spontaneous breathing, nonlabored ventilation and respiratory function stable Cardiovascular status: blood pressure returned to baseline and stable Postop Assessment: no apparent nausea or vomiting Anesthetic complications: no   No complications documented.  Last Vitals:  Vitals:   09/06/20 1210 09/06/20 1628  BP: (!) 188/89 (!) 146/83  Pulse: 77   Resp: 17 (!) 7  Temp: 37 C 36.8 C  SpO2: 98% 98%    Last Pain:  Vitals:   09/06/20 1628  TempSrc:   PainSc: [place] Aniyia Rane

## 2020-09-06 NOTE — Progress Notes (Addendum)
CRITICAL VALUE ALERT  Critical Value:  K >8.5 with iStat and Ca .2 with iStat  Date & Time Notied:  9371  Provider Notified: Dr. Fransisco Beau  Orders Received/Actions taken: Stat BMP  B/P 188/89 Dr. Fransisco Beau aware will continue to monitor patient. Placing on bedside telemetry.   Ran another iStat K 3.7 Dr. Fransisco Beau made aware

## 2020-09-06 NOTE — Anesthesia Preprocedure Evaluation (Addendum)
Anesthesia Evaluation  Patient identified by MRN, date of birth, ID band Patient awake    Reviewed: Allergy & Precautions, NPO status , Patient's Chart, lab work & pertinent test results  History of Anesthesia Complications Negative for: history of anesthetic complications  Airway Mallampati: II  TM Distance: >3 FB Neck ROM: Full    Dental  (+) Dental Advisory Given, Partial Upper   Pulmonary former smoker,    Pulmonary exam normal        Cardiovascular hypertension, Pt. on medications and Pt. on home beta blockers + Peripheral Vascular Disease  Normal cardiovascular exam     Neuro/Psych  Neuromuscular disease (neuropathy) negative psych ROS   GI/Hepatic GERD  Medicated and Controlled,(+) Hepatitis -, C  Endo/Other  diabetes, Type 2, Insulin Dependent  Renal/GU ESRF and DialysisRenal disease     Musculoskeletal negative musculoskeletal ROS (+)   Abdominal   Peds  Hematology negative hematology ROS (+)   Anesthesia Other Findings Covid test negative   Reproductive/Obstetrics                            Anesthesia Physical Anesthesia Plan  ASA: III  Anesthesia Plan: General   Post-op Pain Management:    Induction: Intravenous  PONV Risk Score and Plan: 2 and Treatment may vary due to age or medical condition, Ondansetron, Dexamethasone and Midazolam  Airway Management Planned: LMA  Additional Equipment: None  Intra-op Plan:   Post-operative Plan: Extubation in OR  Informed Consent: I have reviewed the patients History and Physical, chart, labs and discussed the procedure including the risks, benefits and alternatives for the proposed anesthesia with the patient or authorized representative who has indicated his/her understanding and acceptance.     Dental advisory given  Plan Discussed with: CRNA and Anesthesiologist  Anesthesia Plan Comments: (Case pending lab results)        Anesthesia Quick Evaluation

## 2020-09-06 NOTE — Discharge Instructions (Addendum)
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
## 2020-09-07 ENCOUNTER — Encounter (HOSPITAL_COMMUNITY): Payer: Self-pay | Admitting: Orthopedic Surgery

## 2020-09-08 LAB — SURGICAL PATHOLOGY

## 2020-09-09 ENCOUNTER — Other Ambulatory Visit: Payer: Self-pay

## 2020-09-09 ENCOUNTER — Encounter: Payer: Self-pay | Admitting: Internal Medicine

## 2020-09-09 ENCOUNTER — Ambulatory Visit (INDEPENDENT_AMBULATORY_CARE_PROVIDER_SITE_OTHER): Payer: Medicare Other | Admitting: Internal Medicine

## 2020-09-09 DIAGNOSIS — L089 Local infection of the skin and subcutaneous tissue, unspecified: Secondary | ICD-10-CM | POA: Diagnosis present

## 2020-09-09 NOTE — Assessment & Plan Note (Signed)
The infection of his right hand appears to have been cured.  I am not sure he has any active infection of his left hand.  He follows up with his orthopedic surgeon early next week.  If there is no sign of infection at that time I would recommend stopping amoxicillin clavulanate.  He will follow-up here in 6 weeks.
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
## 2020-09-21 ENCOUNTER — Telehealth: Payer: Self-pay | Admitting: Internal Medicine

## 2020-09-21 ENCOUNTER — Other Ambulatory Visit: Payer: Self-pay | Admitting: Internal Medicine

## 2020-09-21 DIAGNOSIS — L089 Local infection of the skin and subcutaneous tissue, unspecified: Secondary | ICD-10-CM

## 2020-09-21 MED ORDER — SULFAMETHOXAZOLE-TRIMETHOPRIM 400-80 MG PO TABS: 1.0000 | ORAL_TABLET | Freq: Every day | ORAL | 0 refills | Status: DC

## 2020-09-21 NOTE — Telephone Encounter (Signed)
Called patient to schedule the Palliative Consult, it went straight to voicemail but I was unable to leave a message due to mailbox was full.

## 2020-09-21 NOTE — Telephone Encounter (Signed)
Stiles had repeat hand cultures by Dr. Burney Gauze recently.  The cultures grew E. coli, Klebsiella and Serratia all susceptible to trimethoprim sulfamethoxazole.  As expected Serratia was resistant to amoxicillin clavulanate that he is currently taking.  The cultures also grew staph pseudointermedius.  No antibiotic susceptibilities were performed for this isolate.  I called him and told him I would like to change his amoxicillin clavulanate to trimethoprim sulfamethoxazole, 1 single strength tablet taken each evening after dialysis.  He is in agreement with that plan.

## 2020-09-21 NOTE — Telephone Encounter (Signed)
I also called patient's wife's cell with no answer and unable to leave a message due to mailbox was full.

## 2020-09-22 IMAGING — MR MR FOOT*R* W/O CM
4 of 5 series · 24 of 40 positions shown · non-contrast
Comparison: None.

CLINICAL DATA: 57-year-old male presents with 1 week history of
right foot pressure worse in the lateral aspect with purulent
drainage and skin breakdown x2 weeks prior.

EXAM:
MRI OF THE RIGHT FOREFOOT WITHOUT CONTRAST
TECHNIQUE: Multiplanar, multisequence MR imaging of the right foot was
performed. No intravenous contrast was administered.

[Series 4: T1 · coronal · 3.0mm · 0.27mm/px · 9 of 47 slices shown (1 of 2)]
[im 1/47]
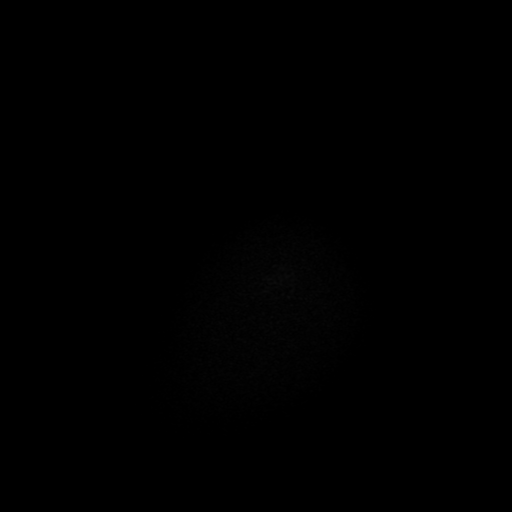
[im 6/47]
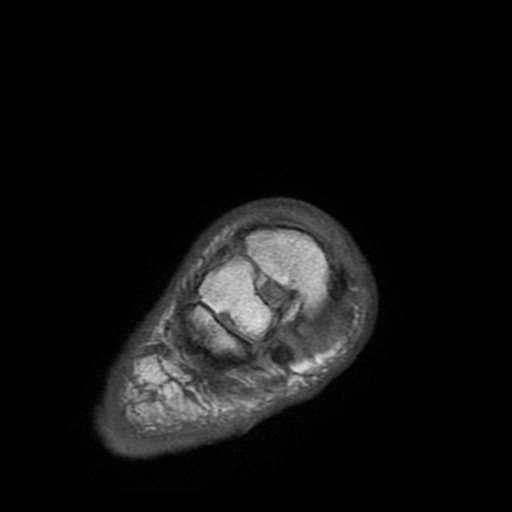
[im 12/47]
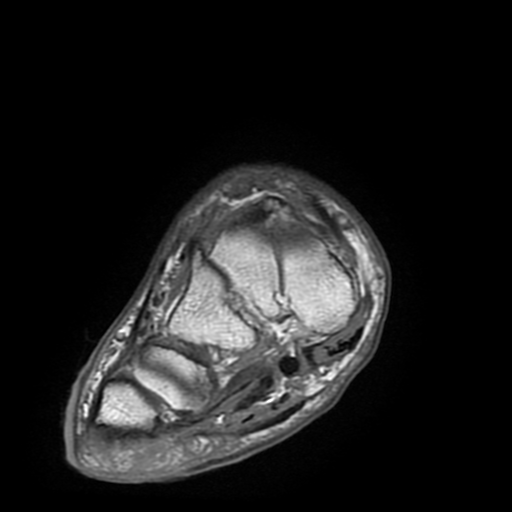
[im 18/47]
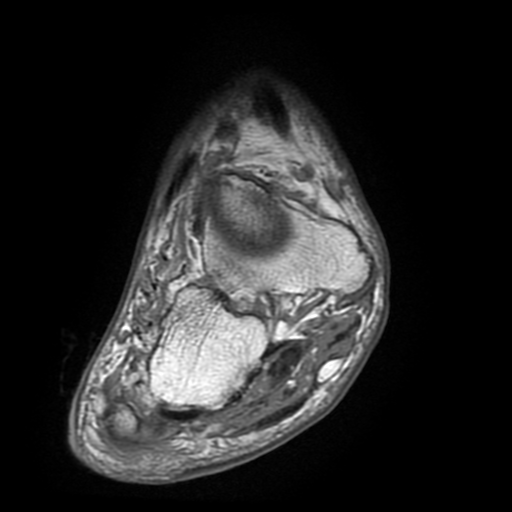
[im 24/47]
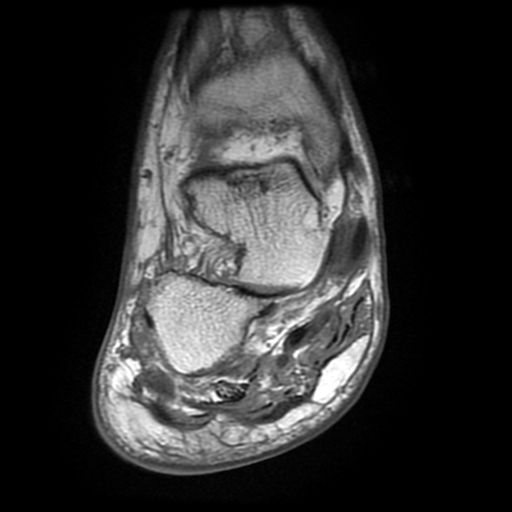
[im 29/47]
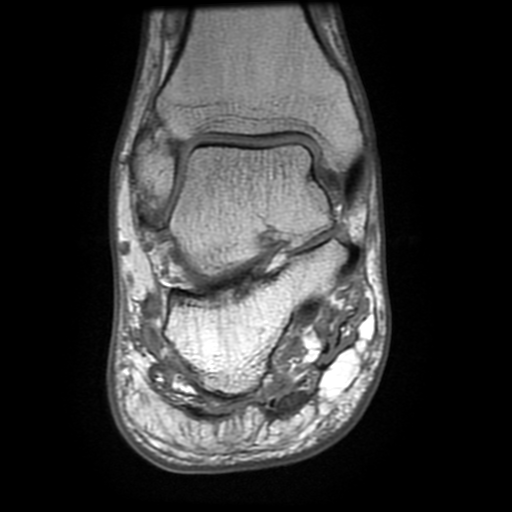
[im 35/47]
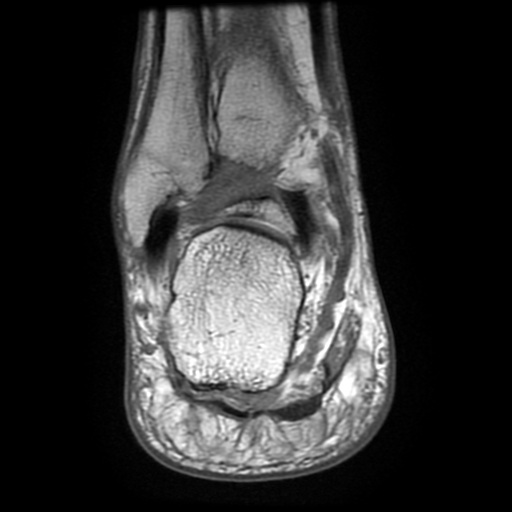
[im 41/47]
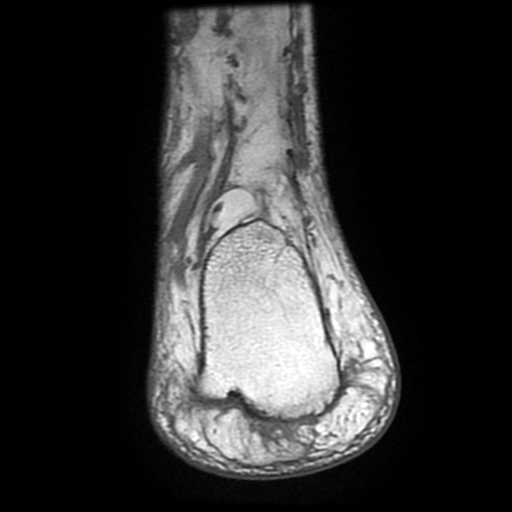
[im 47/47]
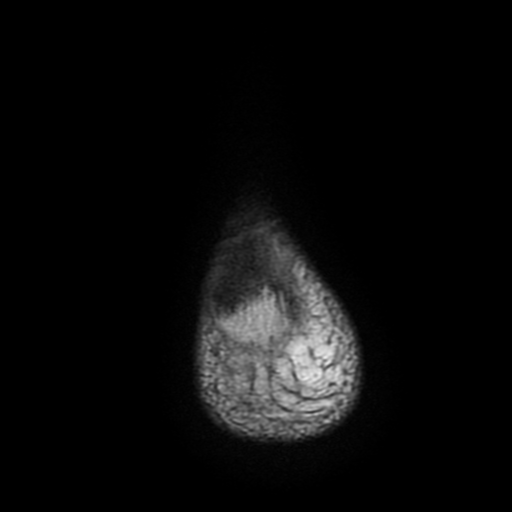

[Series 5: T2 fat-sat · coronal · 3.0mm · 0.55mm/px · 8 of 47 slices shown]
[im 1/47]
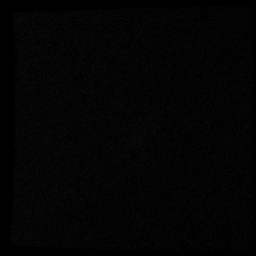
[im 6/47]
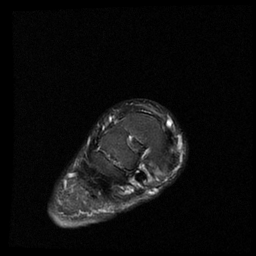
[im 16/47]
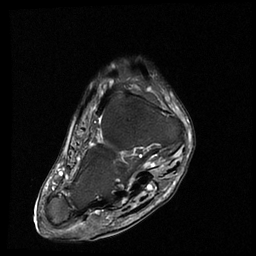
[im 21/47]
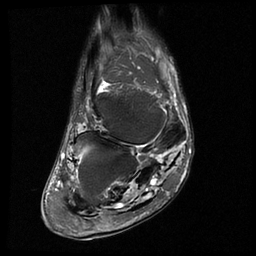
[im 26/47]
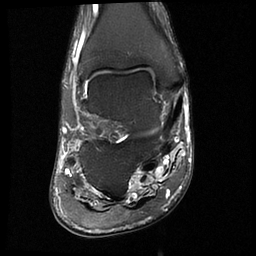
[im 31/47]
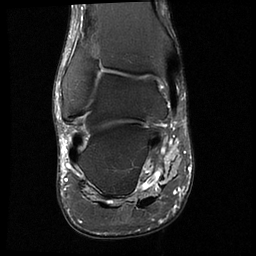
[im 41/47]
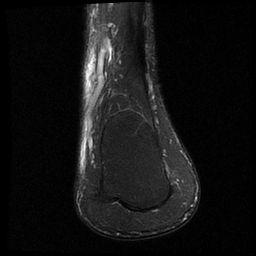
[im 47/47]
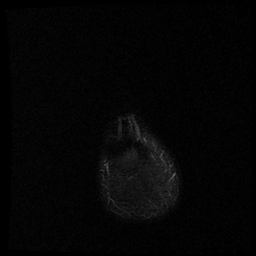

[Series 6: T1 · axial · 3.0mm · 0.35mm/px · z∈[-63,+14]mm · 4 of 30 slices shown (2 of 2)]
[im 1/30]
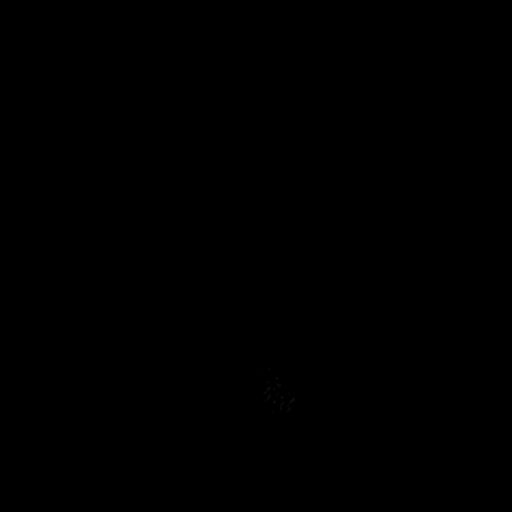
[im 5/30]
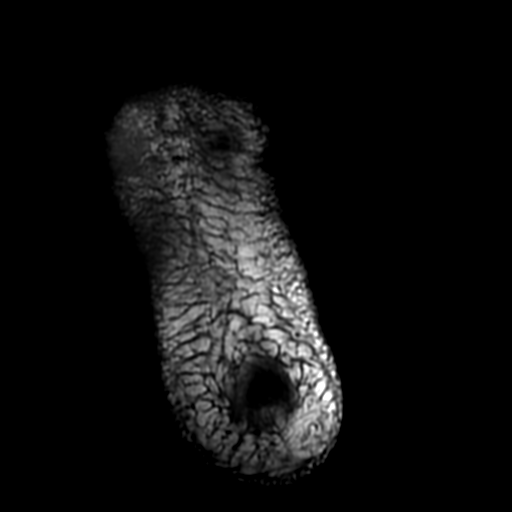
[im 15/30]
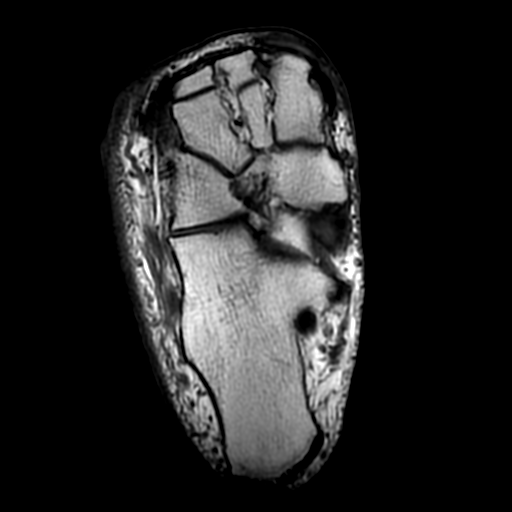
[im 25/30]
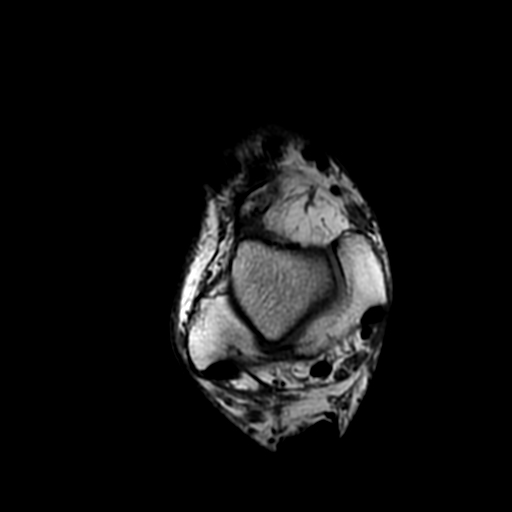

[Series 7: STIR · axial · 3.0mm · 0.35mm/px · z∈[-50,+14]mm · 3 of 30 slices shown]
[im 5/30]
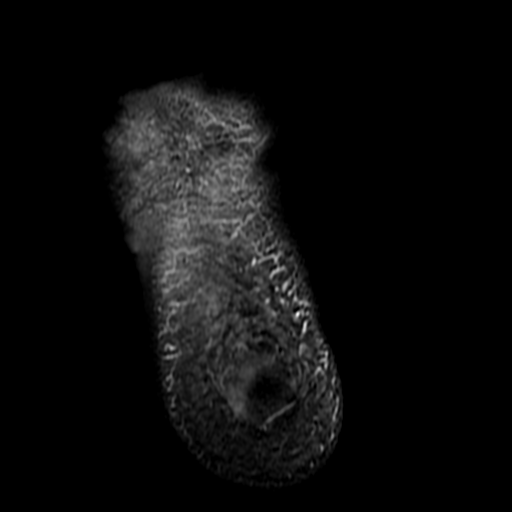
[im 15/30]
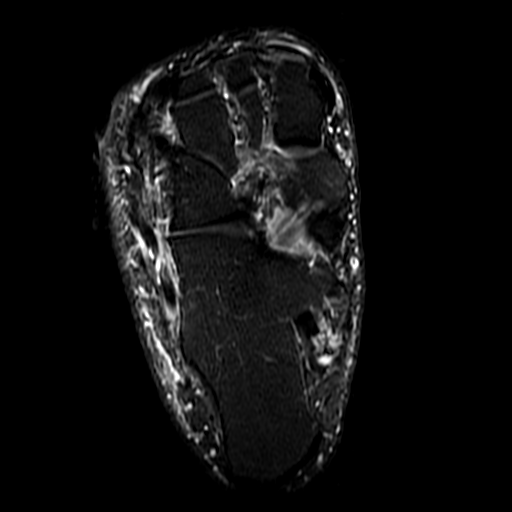
[im 25/30]
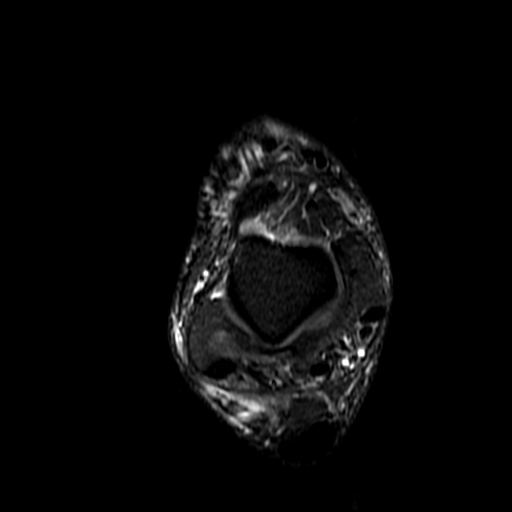

[24 of 40 positions shown; findings below may reference images not displayed]

FINDINGS: Bones/Joint/Cartilage

Subtle cortical irregularity and marrow edema along the plantar
lateral aspect of the fifth metatarsal base, adjacent to an area of
subcutaneous soft tissue edema/cellulitis and ulceration, series
[DATE]. No acute fracture. No suspicious osseous lesions. The
tibiotalar and subtalar joints are maintained. Mild osteoarthritic
joint space narrowing of the midfoot. No joint effusion.

Ligaments

Ankle ligaments intact.

Muscles and Tendons

The tendons crossing the ankle joint appear intact. No evidence of
pyomyositis.

Soft tissues

Subcutaneous soft tissue edema compatible cellulitis along the
plantar lateral aspect of the midfoot adjacent to the amputation
margin. Tiny 4 mm focus of fluid within the cellulitis is suspicious
for tiny abscess series [DATE]. Lack of IV contrast limits further
assessment.
IMPRESSION: Subtle cortical irregularity along the plantar lateral aspect of the
fifth metatarsal base adjacent to an area of soft tissue ulceration,
cellulitis and tiny presumed 4 mm soft tissue abscess. Findings
would be in keeping with early changes of osteomyelitis.

## 2020-09-23 ENCOUNTER — Telehealth: Payer: Self-pay | Admitting: Internal Medicine

## 2020-09-23 NOTE — Telephone Encounter (Signed)
Called patient's cell to schedule a Palliative consult, phone did not ring it went straight to voicemail and I was unable to leave a message due to mailbox was full.  I then called patient's listed home # and spoke with patient.  I explained why I was calling and where we rec'd the referral from.  After discussing with patient what Palliative services were he was in agreement to schedule visit.  During the scheduling process patient abruptly stopped the conversation and said that he had to leave to go to a MD appointment and I asked if I could call him back later this afternoon and he said yes.  Will f/u with him later.

## 2020-09-23 NOTE — Telephone Encounter (Signed)
Called patient back to finish scheduling the Palliative consult, no answer - left message with my name and contact number requesting a return call.

## 2020-09-30 ENCOUNTER — Inpatient Hospital Stay (HOSPITAL_COMMUNITY)
Admission: RE | Admit: 2020-09-30 | Discharge: 2020-10-06 | DRG: 299 | Disposition: A | Discharge status: Rehabilitation Facility | Payer: Medicare Other | Attending: Internal Medicine | Admitting: Internal Medicine

## 2020-09-30 ENCOUNTER — Other Ambulatory Visit: Payer: Self-pay | Admitting: Orthopedic Surgery

## 2020-09-30 DIAGNOSIS — Z515 Encounter for palliative care: Secondary | ICD-10-CM | POA: Diagnosis not present

## 2020-09-30 DIAGNOSIS — Z89612 Acquired absence of left leg above knee: Secondary | ICD-10-CM

## 2020-09-30 DIAGNOSIS — Z89029 Acquired absence of unspecified finger(s): Secondary | ICD-10-CM | POA: Diagnosis not present

## 2020-09-30 DIAGNOSIS — Z794 Long term (current) use of insulin: Secondary | ICD-10-CM

## 2020-09-30 DIAGNOSIS — E1140 Type 2 diabetes mellitus with diabetic neuropathy, unspecified: Secondary | ICD-10-CM | POA: Diagnosis present

## 2020-09-30 DIAGNOSIS — I998 Other disorder of circulatory system: Secondary | ICD-10-CM | POA: Diagnosis present

## 2020-09-30 DIAGNOSIS — B964 Proteus (mirabilis) (morganii) as the cause of diseases classified elsewhere: Secondary | ICD-10-CM | POA: Diagnosis not present

## 2020-09-30 DIAGNOSIS — I739 Peripheral vascular disease, unspecified: Secondary | ICD-10-CM | POA: Diagnosis not present

## 2020-09-30 DIAGNOSIS — D72829 Elevated white blood cell count, unspecified: Secondary | ICD-10-CM | POA: Diagnosis not present

## 2020-09-30 DIAGNOSIS — E876 Hypokalemia: Secondary | ICD-10-CM | POA: Diagnosis not present

## 2020-09-30 DIAGNOSIS — K5900 Constipation, unspecified: Secondary | ICD-10-CM | POA: Diagnosis present

## 2020-09-30 DIAGNOSIS — E1122 Type 2 diabetes mellitus with diabetic chronic kidney disease: Secondary | ICD-10-CM | POA: Diagnosis not present

## 2020-09-30 DIAGNOSIS — K219 Gastro-esophageal reflux disease without esophagitis: Secondary | ICD-10-CM | POA: Diagnosis present

## 2020-09-30 DIAGNOSIS — Z87891 Personal history of nicotine dependence: Secondary | ICD-10-CM

## 2020-09-30 DIAGNOSIS — I120 Hypertensive chronic kidney disease with stage 5 chronic kidney disease or end stage renal disease: Secondary | ICD-10-CM | POA: Diagnosis present

## 2020-09-30 DIAGNOSIS — Z20822 Contact with and (suspected) exposure to covid-19: Secondary | ICD-10-CM | POA: Diagnosis present

## 2020-09-30 DIAGNOSIS — T8149XA Infection following a procedure, other surgical site, initial encounter: Secondary | ICD-10-CM | POA: Diagnosis not present

## 2020-09-30 DIAGNOSIS — D631 Anemia in chronic kidney disease: Secondary | ICD-10-CM | POA: Diagnosis not present

## 2020-09-30 DIAGNOSIS — Z7189 Other specified counseling: Secondary | ICD-10-CM | POA: Diagnosis not present

## 2020-09-30 DIAGNOSIS — Z79899 Other long term (current) drug therapy: Secondary | ICD-10-CM

## 2020-09-30 DIAGNOSIS — N2581 Secondary hyperparathyroidism of renal origin: Secondary | ICD-10-CM | POA: Diagnosis not present

## 2020-09-30 DIAGNOSIS — Z89512 Acquired absence of left leg below knee: Secondary | ICD-10-CM

## 2020-09-30 DIAGNOSIS — E1151 Type 2 diabetes mellitus with diabetic peripheral angiopathy without gangrene: Secondary | ICD-10-CM | POA: Diagnosis present

## 2020-09-30 DIAGNOSIS — I10 Essential (primary) hypertension: Secondary | ICD-10-CM | POA: Diagnosis present

## 2020-09-30 DIAGNOSIS — E1169 Type 2 diabetes mellitus with other specified complication: Secondary | ICD-10-CM | POA: Diagnosis not present

## 2020-09-30 DIAGNOSIS — R0989 Other specified symptoms and signs involving the circulatory and respiratory systems: Secondary | ICD-10-CM | POA: Diagnosis not present

## 2020-09-30 DIAGNOSIS — G8918 Other acute postprocedural pain: Secondary | ICD-10-CM | POA: Diagnosis not present

## 2020-09-30 DIAGNOSIS — G479 Sleep disorder, unspecified: Secondary | ICD-10-CM | POA: Diagnosis not present

## 2020-09-30 DIAGNOSIS — I160 Hypertensive urgency: Secondary | ICD-10-CM | POA: Diagnosis not present

## 2020-09-30 DIAGNOSIS — Z4781 Encounter for orthopedic aftercare following surgical amputation: Secondary | ICD-10-CM | POA: Diagnosis not present

## 2020-09-30 DIAGNOSIS — E8889 Other specified metabolic disorders: Secondary | ICD-10-CM | POA: Diagnosis present

## 2020-09-30 DIAGNOSIS — Z992 Dependence on renal dialysis: Secondary | ICD-10-CM

## 2020-09-30 DIAGNOSIS — Z66 Do not resuscitate: Secondary | ICD-10-CM | POA: Diagnosis not present

## 2020-09-30 DIAGNOSIS — I70228 Atherosclerosis of native arteries of extremities with rest pain, other extremity: Secondary | ICD-10-CM | POA: Diagnosis not present

## 2020-09-30 DIAGNOSIS — I150 Renovascular hypertension: Secondary | ICD-10-CM | POA: Diagnosis not present

## 2020-09-30 DIAGNOSIS — N186 End stage renal disease: Secondary | ICD-10-CM | POA: Diagnosis not present

## 2020-09-30 DIAGNOSIS — R627 Adult failure to thrive: Secondary | ICD-10-CM | POA: Diagnosis present

## 2020-09-30 DIAGNOSIS — K5903 Drug induced constipation: Secondary | ICD-10-CM | POA: Diagnosis not present

## 2020-09-30 DIAGNOSIS — Z89511 Acquired absence of right leg below knee: Secondary | ICD-10-CM | POA: Diagnosis not present

## 2020-09-30 DIAGNOSIS — Z89611 Acquired absence of right leg above knee: Secondary | ICD-10-CM

## 2020-09-30 DIAGNOSIS — R5381 Other malaise: Secondary | ICD-10-CM | POA: Diagnosis not present

## 2020-09-30 DIAGNOSIS — E785 Hyperlipidemia, unspecified: Secondary | ICD-10-CM | POA: Diagnosis present

## 2020-09-30 DIAGNOSIS — D638 Anemia in other chronic diseases classified elsewhere: Secondary | ICD-10-CM | POA: Diagnosis not present

## 2020-09-30 DIAGNOSIS — E119 Type 2 diabetes mellitus without complications: Secondary | ICD-10-CM

## 2020-09-30 LAB — RENAL FUNCTION PANEL
Albumin: 2.8 g/dL — ABNORMAL LOW (ref 3.5–5.0)
Anion gap: 18 — ABNORMAL HIGH (ref 5–15)
BUN: 65 mg/dL — ABNORMAL HIGH (ref 8–23)
CO2: 22 mmol/L (ref 22–32)
Calcium: 8.4 mg/dL — ABNORMAL LOW (ref 8.9–10.3)
Chloride: 100 mmol/L (ref 98–111)
Creatinine, Ser: 10.6 mg/dL — ABNORMAL HIGH (ref 0.61–1.24)
GFR, Estimated: 5 mL/min — ABNORMAL LOW (ref >60)
Glucose, Bld: 105 mg/dL — ABNORMAL HIGH (ref 70–99)
Phosphorus: 8.3 mg/dL — ABNORMAL HIGH (ref 2.5–4.6)
Potassium: 3.6 mmol/L (ref 3.5–5.1)
Sodium: 140 mmol/L (ref 135–145)

## 2020-09-30 LAB — CBC
HCT: 26.8 % — ABNORMAL LOW (ref 39.0–52.0)
Hemoglobin: 8.1 g/dL — ABNORMAL LOW (ref 13.0–17.0)
MCH: 22.8 pg — ABNORMAL LOW (ref 26.0–34.0)
MCHC: 30.2 g/dL (ref 30.0–36.0)
MCV: 75.5 fL — ABNORMAL LOW (ref 80.0–100.0)
Platelets: 186 10*3/uL (ref 150–400)
RBC: 3.55 MIL/uL — ABNORMAL LOW (ref 4.22–5.81)
RDW: 18.7 % — ABNORMAL HIGH (ref 11.5–15.5)
WBC: 10.6 10*3/uL — ABNORMAL HIGH (ref 4.0–10.5)
nRBC: 0 % (ref 0.0–0.2)

## 2020-09-30 LAB — TYPE AND SCREEN
ABO/RH(D): O POS
Antibody Screen: NEGATIVE

## 2020-09-30 LAB — RESPIRATORY PANEL BY RT PCR (FLU A&B, COVID)
Influenza A by PCR: NEGATIVE
Influenza B by PCR: NEGATIVE
SARS Coronavirus 2 by RT PCR: NEGATIVE

## 2020-09-30 LAB — PROTIME-INR
INR: 1.2 (ref 0.8–1.2)
Prothrombin Time: 15.1 seconds (ref 11.4–15.2)

## 2020-09-30 LAB — SURGICAL PCR SCREEN
MRSA, PCR: NEGATIVE
Staphylococcus aureus: NEGATIVE

## 2020-09-30 LAB — GLUCOSE, CAPILLARY: Glucose-Capillary: 89 mg/dL (ref 70–99)

## 2020-09-30 MED ORDER — ZOLPIDEM TARTRATE 5 MG PO TABS: 5.0000 mg | ORAL_TABLET | Freq: Every evening | ORAL | Status: DC | PRN

## 2020-09-30 MED ORDER — CALCIUM CARBONATE ANTACID 1250 MG/5ML PO SUSP
500.0000 mg | Freq: Four times a day (QID) | ORAL | Status: DC | PRN
  Filled 2020-09-30: qty 5

## 2020-09-30 MED ORDER — PENTAFLUOROPROP-TETRAFLUOROETH EX AERO: 1.0000 "application " | INHALATION_SPRAY | CUTANEOUS | Status: DC | PRN

## 2020-09-30 MED ORDER — HEPARIN SODIUM (PORCINE) 5000 UNIT/ML IJ SOLN
5000.0000 [IU] | Freq: Three times a day (TID) | INTRAMUSCULAR | Status: DC
  Administered 2020-10-01 – 2020-10-06 (×7): 5000 [IU] via SUBCUTANEOUS
  Filled 2020-09-30 (×11): qty 1

## 2020-09-30 MED ORDER — DOCUSATE SODIUM 100 MG PO CAPS
100.0000 mg | ORAL_CAPSULE | Freq: Two times a day (BID) | ORAL | Status: DC
  Administered 2020-10-01 – 2020-10-06 (×6): 100 mg via ORAL
  Filled 2020-09-30 (×11): qty 1

## 2020-09-30 MED ORDER — MORPHINE SULFATE (PF) 2 MG/ML IV SOLN
2.0000 mg | INTRAVENOUS | Status: DC | PRN
  Administered 2020-10-01 – 2020-10-02 (×5): 2 mg via INTRAVENOUS
  Filled 2020-09-30 (×6): qty 1

## 2020-09-30 MED ORDER — NEPRO/CARBSTEADY PO LIQD: 237.0000 mL | Freq: Three times a day (TID) | ORAL | Status: DC | PRN

## 2020-09-30 MED ORDER — PREGABALIN 100 MG PO CAPS
100.0000 mg | ORAL_CAPSULE | Freq: Every day | ORAL | Status: DC
  Administered 2020-10-01 – 2020-10-05 (×6): 100 mg via ORAL
  Filled 2020-09-30 (×6): qty 1

## 2020-09-30 MED ORDER — HYDROXYZINE HCL 25 MG PO TABS: 25.0000 mg | ORAL_TABLET | Freq: Three times a day (TID) | ORAL | Status: DC | PRN

## 2020-09-30 MED ORDER — HYDRALAZINE HCL 20 MG/ML IJ SOLN: 5.0000 mg | INTRAMUSCULAR | Status: DC | PRN

## 2020-09-30 MED ORDER — LIDOCAINE HCL (PF) 1 % IJ SOLN: 5.0000 mL | INTRAMUSCULAR | Status: DC | PRN

## 2020-09-30 MED ORDER — LIDOCAINE-PRILOCAINE 2.5-2.5 % EX CREA: 1.0000 "application " | TOPICAL_CREAM | CUTANEOUS | Status: DC | PRN

## 2020-09-30 MED ORDER — SORBITOL 70 % SOLN: 30.0000 mL | Status: DC | PRN

## 2020-09-30 MED ORDER — RENA-VITE PO TABS: 1.0000 | ORAL_TABLET | ORAL | Status: DC

## 2020-09-30 MED ORDER — SODIUM CHLORIDE 0.9 % IV SOLN: 100.0000 mL | INTRAVENOUS | Status: DC | PRN

## 2020-09-30 MED ORDER — SEVELAMER CARBONATE 800 MG PO TABS
2400.0000 mg | ORAL_TABLET | Freq: Three times a day (TID) | ORAL | Status: DC
  Administered 2020-10-01 – 2020-10-04 (×8): 2400 mg via ORAL
  Filled 2020-09-30 (×8): qty 3

## 2020-09-30 MED ORDER — OXYCODONE HCL 5 MG PO TABS
5.0000 mg | ORAL_TABLET | Freq: Four times a day (QID) | ORAL | Status: DC | PRN
  Administered 2020-10-01 – 2020-10-02 (×4): 10 mg via ORAL
  Filled 2020-09-30 (×4): qty 2

## 2020-09-30 MED ORDER — CAMPHOR-MENTHOL 0.5-0.5 % EX LOTN
1.0000 "application " | TOPICAL_LOTION | Freq: Three times a day (TID) | CUTANEOUS | Status: DC | PRN
  Filled 2020-09-30: qty 222

## 2020-09-30 MED ORDER — ALBUTEROL SULFATE (2.5 MG/3ML) 0.083% IN NEBU: 2.5000 mg | INHALATION_SOLUTION | RESPIRATORY_TRACT | Status: DC | PRN

## 2020-09-30 MED ORDER — ATORVASTATIN CALCIUM 10 MG PO TABS
20.0000 mg | ORAL_TABLET | Freq: Every day | ORAL | Status: DC
  Administered 2020-10-01 – 2020-10-05 (×6): 20 mg via ORAL
  Filled 2020-09-30 (×6): qty 2

## 2020-09-30 MED ORDER — INSULIN ASPART 100 UNIT/ML ~~LOC~~ SOLN
0.0000 [IU] | Freq: Three times a day (TID) | SUBCUTANEOUS | Status: DC
  Administered 2020-10-01: 1 [IU] via SUBCUTANEOUS

## 2020-09-30 MED ORDER — CHLORHEXIDINE GLUCONATE CLOTH 2 % EX PADS
6.0000 | MEDICATED_PAD | Freq: Every day | CUTANEOUS | Status: DC
  Administered 2020-10-01 – 2020-10-05 (×5): 6 via TOPICAL

## 2020-09-30 MED ORDER — ONDANSETRON HCL 4 MG PO TABS: 4.0000 mg | ORAL_TABLET | Freq: Four times a day (QID) | ORAL | Status: DC | PRN

## 2020-09-30 MED ORDER — CARVEDILOL 12.5 MG PO TABS
12.5000 mg | ORAL_TABLET | ORAL | Status: DC
  Administered 2020-10-02 – 2020-10-05 (×3): 12.5 mg via ORAL
  Filled 2020-09-30 (×2): qty 1

## 2020-09-30 MED ORDER — POLYETHYLENE GLYCOL 3350 17 G PO PACK: 17.0000 g | PACK | Freq: Every day | ORAL | Status: DC | PRN

## 2020-09-30 MED ORDER — DOCUSATE SODIUM 283 MG RE ENEM
1.0000 | ENEMA | RECTAL | Status: DC | PRN
  Filled 2020-09-30: qty 1

## 2020-09-30 MED ORDER — CARVEDILOL 12.5 MG PO TABS
12.5000 mg | ORAL_TABLET | Freq: Every day | ORAL | Status: DC
  Administered 2020-10-02 – 2020-10-06 (×4): 12.5 mg via ORAL
  Filled 2020-09-30 (×5): qty 1

## 2020-09-30 MED ORDER — DIALYVITE 800/ZINC 0.8 MG PO TABS: 1.0000 | ORAL_TABLET | ORAL | Status: DC

## 2020-09-30 MED ORDER — ONDANSETRON HCL 4 MG/2ML IJ SOLN: 4.0000 mg | Freq: Four times a day (QID) | INTRAMUSCULAR | Status: DC | PRN

## 2020-09-30 MED ORDER — HEPARIN SODIUM (PORCINE) 1000 UNIT/ML DIALYSIS
1000.0000 [IU] | INTRAMUSCULAR | Status: DC | PRN
  Filled 2020-09-30: qty 1

## 2020-09-30 MED ORDER — ACETAMINOPHEN 325 MG PO TABS
650.0000 mg | ORAL_TABLET | Freq: Four times a day (QID) | ORAL | Status: DC | PRN
  Filled 2020-09-30: qty 2

## 2020-09-30 MED ORDER — ACETAMINOPHEN 650 MG RE SUPP: 650.0000 mg | Freq: Four times a day (QID) | RECTAL | Status: DC | PRN

## 2020-09-30 MED ORDER — BISACODYL 5 MG PO TBEC: 5.0000 mg | DELAYED_RELEASE_TABLET | Freq: Every day | ORAL | Status: DC | PRN
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
## 2020-09-30 NOTE — Progress Notes (Signed)
Patient refused bed alarm at this time, education provided, patient acknowledge. Will continue to monitor.

## 2020-09-30 NOTE — Progress Notes (Signed)
Informed that will be direct admit for hand surgery -- needs dialysis tonight. HD orders placed, full consult to follow.

## 2020-09-30 NOTE — Plan of Care (Signed)

## 2020-10-01 ENCOUNTER — Inpatient Hospital Stay (HOSPITAL_COMMUNITY): Payer: Medicare Other | Admitting: Certified Registered Nurse Anesthetist

## 2020-10-01 ENCOUNTER — Encounter (HOSPITAL_COMMUNITY): Payer: Self-pay | Admitting: Internal Medicine

## 2020-10-01 ENCOUNTER — Encounter (HOSPITAL_COMMUNITY)
Admission: RE | Disposition: A | Discharge status: Rehabilitation Facility | Payer: Self-pay | Source: Home / Self Care | Attending: Student

## 2020-10-01 DIAGNOSIS — Z66 Do not resuscitate: Secondary | ICD-10-CM

## 2020-10-01 DIAGNOSIS — R5381 Other malaise: Secondary | ICD-10-CM

## 2020-10-01 DIAGNOSIS — E876 Hypokalemia: Secondary | ICD-10-CM

## 2020-10-01 DIAGNOSIS — Z7189 Other specified counseling: Secondary | ICD-10-CM

## 2020-10-01 DIAGNOSIS — I150 Renovascular hypertension: Secondary | ICD-10-CM

## 2020-10-01 HISTORY — PX: INCISION AND DRAINAGE OF WOUND: SHX1803

## 2020-10-01 LAB — CBC
HCT: 27.6 % — ABNORMAL LOW (ref 39.0–52.0)
Hemoglobin: 8.3 g/dL — ABNORMAL LOW (ref 13.0–17.0)
MCH: 22.6 pg — ABNORMAL LOW (ref 26.0–34.0)
MCHC: 30.1 g/dL (ref 30.0–36.0)
MCV: 75 fL — ABNORMAL LOW (ref 80.0–100.0)
Platelets: 162 10*3/uL (ref 150–400)
RBC: 3.68 MIL/uL — ABNORMAL LOW (ref 4.22–5.81)
RDW: 18.6 % — ABNORMAL HIGH (ref 11.5–15.5)
WBC: 11.1 10*3/uL — ABNORMAL HIGH (ref 4.0–10.5)
nRBC: 0 % (ref 0.0–0.2)

## 2020-10-01 LAB — POCT I-STAT, CHEM 8
BUN: 27 mg/dL — ABNORMAL HIGH (ref 8–23)
Calcium, Ion: 1.08 mmol/L — ABNORMAL LOW (ref 1.15–1.40)
Chloride: 94 mmol/L — ABNORMAL LOW (ref 98–111)
Creatinine, Ser: 6.8 mg/dL — ABNORMAL HIGH (ref 0.61–1.24)
Glucose, Bld: 114 mg/dL — ABNORMAL HIGH (ref 70–99)
HCT: 30 % — ABNORMAL LOW (ref 39.0–52.0)
Hemoglobin: 10.2 g/dL — ABNORMAL LOW (ref 13.0–17.0)
Potassium: 3.1 mmol/L — ABNORMAL LOW (ref 3.5–5.1)
Sodium: 136 mmol/L (ref 135–145)
TCO2: 30 mmol/L (ref 22–32)

## 2020-10-01 LAB — GLUCOSE, CAPILLARY
Glucose-Capillary: 131 mg/dL — ABNORMAL HIGH (ref 70–99)
Glucose-Capillary: 81 mg/dL (ref 70–99)
Glucose-Capillary: 50 mg/dL — ABNORMAL LOW (ref 70–99)
Glucose-Capillary: 72 mg/dL (ref 70–99)
Glucose-Capillary: 128 mg/dL — ABNORMAL HIGH (ref 70–99)
Glucose-Capillary: 185 mg/dL — ABNORMAL HIGH (ref 70–99)
Glucose-Capillary: 113 mg/dL — ABNORMAL HIGH (ref 70–99)

## 2020-10-01 LAB — BASIC METABOLIC PANEL
Anion gap: 14 (ref 5–15)
BUN: 23 mg/dL (ref 8–23)
CO2: 27 mmol/L (ref 22–32)
Calcium: 8.1 mg/dL — ABNORMAL LOW (ref 8.9–10.3)
Chloride: 94 mmol/L — ABNORMAL LOW (ref 98–111)
Creatinine, Ser: 5.75 mg/dL — ABNORMAL HIGH (ref 0.61–1.24)
GFR, Estimated: 10 mL/min — ABNORMAL LOW (ref >60)
Glucose, Bld: 93 mg/dL (ref 70–99)
Potassium: 2.6 mmol/L — CL (ref 3.5–5.1)
Sodium: 135 mmol/L (ref 135–145)

## 2020-10-01 SURGERY — IRRIGATION AND DEBRIDEMENT WOUND
Anesthesia: General | Laterality: Right

## 2020-10-01 MED ORDER — LIDOCAINE 2% (20 MG/ML) 5 ML SYRINGE
INTRAMUSCULAR | Status: AC
  Filled 2020-10-01: qty 5

## 2020-10-01 MED ORDER — LIDOCAINE HCL 1 % IJ SOLN
INTRAMUSCULAR | Status: DC | PRN
  Administered 2020-10-01: 10 mL

## 2020-10-01 MED ORDER — FENTANYL CITRATE (PF) 250 MCG/5ML IJ SOLN
INTRAMUSCULAR | Status: AC
  Filled 2020-10-01: qty 5

## 2020-10-01 MED ORDER — LIDOCAINE HCL 1 % IJ SOLN
INTRAMUSCULAR | Status: AC
  Filled 2020-10-01: qty 20

## 2020-10-01 MED ORDER — DEXAMETHASONE SODIUM PHOSPHATE 10 MG/ML IJ SOLN
INTRAMUSCULAR | Status: DC | PRN
  Administered 2020-10-01: 4 mg via INTRAVENOUS

## 2020-10-01 MED ORDER — SODIUM CHLORIDE 0.9 % IR SOLN
Status: DC | PRN
  Administered 2020-10-01: 1000 mL

## 2020-10-01 MED ORDER — OXYCODONE HCL 5 MG PO TABS: 5.0000 mg | ORAL_TABLET | Freq: Once | ORAL | Status: DC | PRN

## 2020-10-01 MED ORDER — HYDROMORPHONE HCL 1 MG/ML IJ SOLN
INTRAMUSCULAR | Status: AC
  Filled 2020-10-01: qty 1

## 2020-10-01 MED ORDER — MEPERIDINE HCL 25 MG/ML IJ SOLN: 6.2500 mg | INTRAMUSCULAR | Status: DC | PRN

## 2020-10-01 MED ORDER — POTASSIUM CHLORIDE 10 MEQ/100ML IV SOLN
10.0000 meq | INTRAVENOUS | Status: AC
  Administered 2020-10-01: 10 meq via INTRAVENOUS
  Filled 2020-10-01 (×7): qty 100

## 2020-10-01 MED ORDER — CEFAZOLIN SODIUM-DEXTROSE 2-3 GM-%(50ML) IV SOLR
INTRAVENOUS | Status: DC | PRN
  Administered 2020-10-01: 2 g via INTRAVENOUS

## 2020-10-01 MED ORDER — BUPIVACAINE HCL (PF) 0.25 % IJ SOLN
INTRAMUSCULAR | Status: AC
  Filled 2020-10-01: qty 30

## 2020-10-01 MED ORDER — ONDANSETRON HCL 4 MG/2ML IJ SOLN
INTRAMUSCULAR | Status: DC | PRN
  Administered 2020-10-01: 4 mg via INTRAVENOUS

## 2020-10-01 MED ORDER — PROPOFOL 10 MG/ML IV BOLUS
INTRAVENOUS | Status: DC | PRN
  Administered 2020-10-01: 200 mg via INTRAVENOUS

## 2020-10-01 MED ORDER — MIDAZOLAM HCL 2 MG/2ML IJ SOLN: 0.5000 mg | Freq: Once | INTRAMUSCULAR | Status: DC | PRN

## 2020-10-01 MED ORDER — HYDROMORPHONE HCL 1 MG/ML IJ SOLN
0.2500 mg | INTRAMUSCULAR | Status: DC | PRN
  Administered 2020-10-01 (×2): 0.5 mg via INTRAVENOUS

## 2020-10-01 MED ORDER — HYDROMORPHONE HCL 1 MG/ML IJ SOLN
0.2500 mg | INTRAMUSCULAR | Status: DC | PRN
  Administered 2020-10-01 (×4): 0.5 mg via INTRAVENOUS

## 2020-10-01 MED ORDER — FENTANYL CITRATE (PF) 100 MCG/2ML IJ SOLN
INTRAMUSCULAR | Status: DC | PRN
  Administered 2020-10-01: 75 ug via INTRAVENOUS
  Administered 2020-10-01: 50 ug via INTRAVENOUS

## 2020-10-01 MED ORDER — OXYCODONE HCL 5 MG/5ML PO SOLN: 5.0000 mg | Freq: Once | ORAL | Status: DC | PRN

## 2020-10-01 MED ORDER — HYDROMORPHONE HCL 1 MG/ML IJ SOLN
INTRAMUSCULAR | Status: AC
  Filled 2020-10-01: qty 2

## 2020-10-01 MED ORDER — SULFAMETHOXAZOLE-TRIMETHOPRIM 800-160 MG PO TABS
1.0000 | ORAL_TABLET | Freq: Once | ORAL | Status: AC
  Administered 2020-10-01: 1 via ORAL
  Filled 2020-10-01: qty 1

## 2020-10-01 MED ORDER — CEFAZOLIN SODIUM-DEXTROSE 2-4 GM/100ML-% IV SOLN
2.0000 g | INTRAVENOUS | Status: DC
  Filled 2020-10-01: qty 100

## 2020-10-01 MED ORDER — SULFAMETHOXAZOLE-TRIMETHOPRIM 400-80 MG PO TABS
1.0000 | ORAL_TABLET | Freq: Two times a day (BID) | ORAL | Status: DC
  Administered 2020-10-01 – 2020-10-04 (×6): 1 via ORAL
  Filled 2020-10-01 (×6): qty 1

## 2020-10-01 MED ORDER — LIDOCAINE 2% (20 MG/ML) 5 ML SYRINGE
INTRAMUSCULAR | Status: DC | PRN
  Administered 2020-10-01: 20 mg via INTRAVENOUS

## 2020-10-01 MED ORDER — BISACODYL 5 MG PO TBEC: 5.0000 mg | DELAYED_RELEASE_TABLET | Freq: Every day | ORAL | Status: DC | PRN

## 2020-10-01 MED ORDER — PROMETHAZINE HCL 25 MG/ML IJ SOLN: 6.2500 mg | INTRAMUSCULAR | Status: DC | PRN

## 2020-10-01 MED ORDER — HYDRALAZINE HCL 20 MG/ML IJ SOLN
10.0000 mg | INTRAMUSCULAR | Status: DC | PRN
  Administered 2020-10-01: 10 mg via INTRAVENOUS
  Filled 2020-10-01: qty 1

## 2020-10-01 MED ORDER — POVIDONE-IODINE 10 % EX SWAB: 2.0000 "application " | Freq: Once | CUTANEOUS | Status: DC

## 2020-10-01 MED ORDER — POTASSIUM CHLORIDE 10 MEQ/100ML IV SOLN
10.0000 meq | INTRAVENOUS | Status: AC
  Administered 2020-10-01 (×3): 10 meq via INTRAVENOUS

## 2020-10-01 MED ORDER — MIDAZOLAM HCL 2 MG/2ML IJ SOLN
INTRAMUSCULAR | Status: DC | PRN
  Administered 2020-10-01: 1 mg via INTRAVENOUS

## 2020-10-01 MED ORDER — MIDAZOLAM HCL 2 MG/2ML IJ SOLN
INTRAMUSCULAR | Status: AC
  Filled 2020-10-01: qty 2

## 2020-10-01 MED ORDER — PROPOFOL 10 MG/ML IV BOLUS
INTRAVENOUS | Status: AC
  Filled 2020-10-01: qty 40

## 2020-10-01 SURGICAL SUPPLY — 29 items
BNDG ELASTIC 4X5.8 VLCR STR LF (GAUZE/BANDAGES/DRESSINGS) ×4 IMPLANT
BNDG GAUZE ELAST 4 BULKY (GAUZE/BANDAGES/DRESSINGS) ×4 IMPLANT
CORD BIPOLAR FORCEPS 12FT (ELECTRODE) ×2 IMPLANT
COVER SURGICAL LIGHT HANDLE (MISCELLANEOUS) ×6 IMPLANT
DRAPE SURG 17X23 STRL (DRAPES) ×3 IMPLANT
DRSG XEROFORM 1X8 (GAUZE/BANDAGES/DRESSINGS) ×4 IMPLANT
DURAPREP 26ML APPLICATOR (WOUND CARE) ×3 IMPLANT
GAUZE SPONGE 4X4 12PLY STRL (GAUZE/BANDAGES/DRESSINGS) ×4 IMPLANT
GLOVE SURG SYN 8.0 (GLOVE) ×3 IMPLANT
GLOVE SURG SYN 8.0 PF PI (GLOVE) ×1 IMPLANT
GOWN STRL REUS W/ TWL LRG LVL3 (GOWN DISPOSABLE) ×1 IMPLANT
GOWN STRL REUS W/ TWL XL LVL3 (GOWN DISPOSABLE) ×1 IMPLANT
GOWN STRL REUS W/TWL LRG LVL3 (GOWN DISPOSABLE) ×3
GOWN STRL REUS W/TWL XL LVL3 (GOWN DISPOSABLE) ×3
KIT BASIN OR (CUSTOM PROCEDURE TRAY) ×3 IMPLANT
KIT TURNOVER KIT B (KITS) ×3 IMPLANT
NS IRRIG 1000ML POUR BTL (IV SOLUTION) ×3 IMPLANT
PACK ORTHO EXTREMITY (CUSTOM PROCEDURE TRAY) ×3 IMPLANT
PAD ARMBOARD 7.5X6 YLW CONV (MISCELLANEOUS) ×6 IMPLANT
PAD CAST 4YDX4 CTTN HI CHSV (CAST SUPPLIES) ×2 IMPLANT
PADDING CAST COTTON 4X4 STRL (CAST SUPPLIES) ×6
SWAB COLLECTION DEVICE MRSA (MISCELLANEOUS) ×3 IMPLANT
SWAB CULTURE ESWAB REG 1ML (MISCELLANEOUS) ×2 IMPLANT
TOWEL GREEN STERILE (TOWEL DISPOSABLE) ×3 IMPLANT
TOWEL GREEN STERILE FF (TOWEL DISPOSABLE) ×3 IMPLANT
TUBE CONNECTING 12'X1/4 (SUCTIONS) ×1
TUBE CONNECTING 12X1/4 (SUCTIONS) ×2 IMPLANT
UNDERPAD 30X36 HEAVY ABSORB (UNDERPADS AND DIAPERS) ×5 IMPLANT
YANKAUER SUCT BULB TIP NO VENT (SUCTIONS) ×3 IMPLANT

## 2020-10-01 NOTE — Progress Notes (Signed)
Report given to short stay nurse. Patient transported to surgery.

## 2020-10-01 NOTE — Transfer of Care (Signed)
Immediate Anesthesia Transfer of Care Note  Patient: [name]  Procedure(s) Performed: IRRIGATION AND DEBRIDEMENT WOUND RIGHT HAND (Right )  Patient Location: PACU  Anesthesia Type:General  Level of Consciousness: drowsy  Airway & Oxygen Therapy: Patient Spontanous Breathing  Post-op Assessment: Report given to RN and Post -op Vital signs reviewed and stable  Post vital signs: Reviewed and stable  Last Vitals:  Vitals Value Taken Time  BP 178/78 10/01/20 1211  Temp    Pulse 69 10/01/20 1216  Resp 7 10/01/20 1216  SpO2 96 % 10/01/20 1216  Vitals shown include unvalidated device data.  Last Pain:  Vitals:   10/01/20 0851  TempSrc: Oral  PainSc:       Patients Stated Pain Goal: 3 (75/05/18 3358)  Complications: No complications documented.

## 2020-10-01 NOTE — Plan of Care (Signed)
  Problem: Education: Goal: Knowledge of General Education information will improve Description: Including pain rating scale, medication(s)/side effects and non-pharmacologic comfort measures Outcome: Progressing   Problem: Health Behavior/Discharge Planning: Goal: Ability to manage health-related needs will improve Outcome: Progressing   Problem: Clinical Measurements: Goal: Ability to maintain clinical measurements within normal limits will improve Outcome: Progressing   Problem: Coping: Goal: Level of anxiety will decrease Outcome: Progressing   Problem: Elimination: Goal: Will not experience complications related to bowel motility Outcome: Progressing   Problem: Pain Managment: Goal: General experience of comfort will improve Outcome: Progressing   Problem: Safety: Goal: Ability to remain free from injury will improve Outcome: Progressing   

## 2020-10-01 NOTE — Progress Notes (Signed)
This chaplain responded to consult for Pt. spiritual care.  The chaplain understands from the RN-[place], the Pt. is resting after surgical procedure earlier today. The chaplain will request spiritual care F/U on Saturday.

## 2020-10-01 NOTE — Progress Notes (Signed)
PROGRESS NOTE  [name] OLM:[id number] DOB: Aug 12, 1961   PCP: [name], FNP  Patient is from: Home.  Lives his wife.  DOA: 09/30/2020 LOS: 1  Chief complaints: Left hand pain/ischemia  Brief Narrative / Interim history: 59 year old male with history of ESRD on HD MWF, DM-2, HTN, PVD s/p bilateral BKA with BKA wounds and multiple finger amputations with recent left index finger amputation sent to ED by Dr. Burney Gauze for I&D and debridement of right hand wound.  Patient is also followed by ID, Dr. Megan Salon outpatient.  He is on Bactrim outpatient.  Nephrology consulted.  He had HD on 10/28. Patient underwent irrigation and debridement of right hand wound by Dr. Burney Gauze on 10/01/2020.  Per Dr. Burney Gauze, patient need daily home health RN for dressing change and wound care when he is ready for discharge.  Subjective: Seen and examined earlier this morning before he had I&D and debridement.  Complains 10/10 pain in his hands.  Also reports pain in his legs.  He just received IV pain medication.  He denies chest pain, dyspnea or GI symptoms.  Objective: Vitals:   10/01/20 1231 10/01/20 1246 10/01/20 1301 10/01/20 1340  BP: (!) 188/85 (!) 192/87 (!) 175/89 (!) 208/79  Pulse: 69 66 65 68  Resp: 10 10 10 14   Temp:   98.3 F (36.8 C) 98.2 F (36.8 C)  TempSrc:    Oral  SpO2: 98% 97% 96% 96%  Weight:      Height:        Intake/Output Summary (Last 24 hours) at 10/01/2020 1510 Last data filed at 10/01/2020 1148 Gross per 24 hour  Intake 300 ml  Output 2510 ml  Net -2210 ml   Filed Weights   09/30/20 1940 10/01/20 1050  Weight: 88.1 kg 88.1 kg    Examination:  GENERAL: No apparent distress.  Nontoxic. HEENT: MMM.  Vision and hearing grossly intact.  NECK: Supple.  No apparent JVD.  RESP: On room air.  No IWOB.  Fair aeration bilaterally. CVS:  RRR. Heart sounds normal.  ABD/GI/GU: BS+. Abd soft, NTND.  MSK/EXT: Bilateral BKA.  S/p multiple finger amputations.   Dressing over his hands. SKIN: Chronic wounds over BKA as below.  No signs of infection. NEURO: Awake, alert and oriented appropriately.  No apparent focal neuro deficit. PSYCH: Flat affect.  Procedures:  10/01/2020-irrigation and debridement of right hand wound by Dr. Burney Gauze  Microbiology summarized: COVID-19 PCR negative. Influenza PCR negative.  Surgical tissue culture pending.  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 Burney Gauze on 10/01/2020 -ID following-off antibiotics as of now -Follow surgical tissue culture -Patient needs daily wound dressing on discharge per Dr. Burney Gauze -[facility] for [facility]. -Palliative medicine consulted for goal of care discussion and assistance with pain management -Continue Lyrica, oxycodone and morphine based on pain scale  ESRD on HD MWF: Dialyzed on 09/30/2020 off schedule. -HD per nephrology  Malignant HTN: Elevated SBP -Continue home Coreg and hydralazine -As needed hydralazine with parameters.  Hypokalemia: K2.6. -IV KCl 40 mEq ordered.  Hyperlipidemia -Continue Lipitor  Controlled DM-2 with neuropathy: A1c 5.3%. Recent Labs  Lab 10/01/20 0112 10/01/20 0619 10/01/20 1222 10/01/20 1249 10/01/20 1405  GLUCAP 131* 81 50* 72 128*  -Continue SSI-renal  Goals of care/DNR/DNI-appropriate.  Poor long-term prognosis -Palliative care consulted-recommended outpatient  Lower extremity wound/BKA wound: Noted wounds on left and right BKA.  Unstageable.  No signs of infection. -Wound care consult  Debility: -PT/OT eval  Body mass index is 23.64 kg/m.           DVT prophylaxis:  heparin injection 5,000 Units Start: 09/30/20 1900  Code Status: DNR/DNI Family Communication: Patient and/or RN. Available if any question.  Status is: Inpatient  Remains inpatient appropriate because:Persistent  severe electrolyte disturbances and Inpatient level of care appropriate due to severity of illness   Dispo: The patient is from: Home              Anticipated d/c is to: Home              Anticipated d/c date is: 2 days              Patient currently is not medically stable to d/c.       Consultants:  Surgery Infectious disease Nephrology Palliative medicine   Sch Meds:  Scheduled Meds: . atorvastatin  20 mg Oral QHS  . carvedilol  12.5 mg Oral Q breakfast  . [START ON 10/02/2020] carvedilol  12.5 mg Oral Once per day on Sun Tue Thu Sat  . Chlorhexidine Gluconate Cloth  6 each Topical Q0600  . docusate sodium  100 mg Oral BID  . heparin  5,000 Units Subcutaneous Q8H  . HYDROmorphone      . HYDROmorphone      . HYDROmorphone      . insulin aspart  0-6 Units Subcutaneous TID WC  . multivitamin  1 tablet Oral Q M,W,F-HD  . pregabalin  100 mg Oral QHS  . sevelamer carbonate  2,400 mg Oral TID WC   Continuous Infusions: . sodium chloride    . sodium chloride    . potassium chloride 10 mEq (10/01/20 1411)   PRN Meds:.sodium chloride, sodium chloride, acetaminophen **OR** acetaminophen, albuterol, bisacodyl, calcium carbonate (dosed in mg elemental calcium), camphor-menthol **AND** hydrOXYzine, docusate sodium, feeding supplement (NEPRO CARB STEADY), heparin, hydrALAZINE, lidocaine (PF), lidocaine-prilocaine, morphine injection, ondansetron **OR** ondansetron (ZOFRAN) IV, oxyCODONE, pentafluoroprop-tetrafluoroeth, polyethylene glycol, sorbitol, zolpidem  Antimicrobials: Anti-infectives (From admission, onward)   Start     Dose/Rate Route Frequency Ordered Stop   10/01/20 0815  ceFAZolin (ANCEF) IVPB 2g/100 mL premix  Status:  Discontinued        2 g 200 mL/hr over 30 Minutes Intravenous On call to O.R. 10/01/20 0725 10/01/20 1313       I have personally reviewed the following labs and images: CBC: Recent Labs  Lab 09/30/20 1747 10/01/20 0354 10/01/20 1047  WBC  10.6* 11.1*  --   HGB 8.1* 8.3* 10.2*  HCT 26.8* 27.6* 30.0*  MCV 75.5* 75.0*  --   PLT 186 162  --    BMP &GFR Recent Labs  Lab 09/30/20 1747 10/01/20 0354 10/01/20 1047  NA 140 135 136  K 3.6 2.6* 3.1*  CL 100 94* 94*  CO2 22 27  --   GLUCOSE 105* 93 114*  BUN 65* 23 27*  CREATININE 10.60* 5.75* 6.80*  CALCIUM 8.4* 8.1*  --   PHOS 8.3*  --   --    Estimated Creatinine Clearance: 14 mL/min (A) (by C-G formula based on SCr of 6.8 mg/dL (H)). Liver & Pancreas: Recent Labs  Lab 09/30/20 1747  ALBUMIN 2.8*   No results for input(s): LIPASE, AMYLASE in the last 168 hours. No results for input(s): AMMONIA in the last 168 hours. Diabetic: No results for input(s): HGBA1C in the last 72 hours. Recent Labs  Lab 10/01/20 0112 10/01/20 0619 10/01/20 1222 10/01/20 1249 10/01/20 1405  GLUCAP 131* 81 50* 72 128*   Cardiac Enzymes: No results for input(s): CKTOTAL, CKMB, CKMBINDEX, TROPONINI in the last 168 hours. No results for input(s): PROBNP in the last 8760 hours. Coagulation Profile: Recent Labs  Lab 09/30/20 1747  INR 1.2   Thyroid Function Tests: No results for input(s): TSH, T4TOTAL, FREET4, T3FREE, THYROIDAB in the last 72 hours. Lipid Profile: No results for input(s): CHOL, HDL, LDLCALC, TRIG, CHOLHDL, LDLDIRECT in the last 72 hours. Anemia Panel: No results for input(s): VITAMINB12, FOLATE, FERRITIN, TIBC, IRON, RETICCTPCT in the last 72 hours. Urine analysis:    Component Value Date/Time   COLORURINE YELLOW 09/10/2018 1927   APPEARANCEUR HAZY (A) 09/10/2018 1927   LABSPEC 1.011 09/10/2018 1927   PHURINE 5.0 09/10/2018 1927   GLUCOSEU 50 (A) 09/10/2018 1927   HGBUR MODERATE (A) 09/10/2018 1927   BILIRUBINUR NEGATIVE 09/10/2018 1927   KETONESUR NEGATIVE 09/10/2018 1927   PROTEINUR 100 (A) 09/10/2018 1927   NITRITE NEGATIVE 09/10/2018 1927   LEUKOCYTESUR NEGATIVE 09/10/2018 1927   Sepsis Labs: Invalid input(s): PROCALCITONIN,  [place]  Microbiology: Recent Results (from the past 240 hour(s))  Respiratory Panel by RT PCR (Flu A&B, Covid) - Nasopharyngeal Swab     Status: None   Collection Time: 09/30/20  3:37 PM   Specimen: Nasopharyngeal Swab  Result Value Ref Range Status   SARS Coronavirus 2 by RT PCR NEGATIVE NEGATIVE Final    Comment: (NOTE) SARS-CoV-2 target nucleic acids are NOT DETECTED.  The SARS-CoV-2 RNA is generally detectable in upper respiratoy specimens during the acute phase of infection. The lowest concentration of SARS-CoV-2 viral copies this assay can detect is 131 copies/mL. A negative result does not preclude SARS-Cov-2 infection and should not be used as the sole basis for treatment or other patient management decisions. A negative result may occur with  improper specimen collection/handling, submission of specimen other than nasopharyngeal swab, presence of viral mutation(s) within the areas targeted by this assay, and inadequate number of viral copies (<131 copies/mL). A negative result must be combined with clinical observations, patient history, and epidemiological information. The expected result is Negative.  Fact Sheet for Patients:  PinkCheek.be  Fact Sheet for Healthcare Providers:  GravelBags.it  This test is no t yet approved or cleared by the [place] FDA and  has been authorized for detection and/or diagnosis of SARS-CoV-2 by FDA under an Emergency Use Authorization (EUA). This EUA will remain  in effect (meaning this test can be used) for the duration of the COVID-19 declaration under Section 564(b)(1) of the Act, 21 U.S.C. section 360bbb-3(b)(1), unless the authorization is terminated or revoked sooner.     Influenza A by PCR NEGATIVE NEGATIVE Final   Influenza B by PCR NEGATIVE NEGATIVE Final    Comment: (NOTE) The Xpert Xpress SARS-CoV-2/FLU/RSV assay is intended as an aid in  the diagnosis of  influenza from Nasopharyngeal swab specimens and  should not be used as a sole basis for treatment. Nasal washings and  aspirates are unacceptable for Xpert Xpress SARS-CoV-2/FLU/RSV  testing.  Fact Sheet for Patients: PinkCheek.be  Fact Sheet for Healthcare Providers: GravelBags.it  This test is not yet approved or cleared by the [place] FDA and  has been authorized for detection and/or diagnosis of SARS-CoV-2 by  FDA under an Emergency Use Authorization (EUA). This EUA will remain  in effect (meaning this test can be used) for the duration of the  Covid-19  declaration under Section 564(b)(1) of the Act, 21  U.S.C. section 360bbb-3(b)(1), unless the authorization is  terminated or revoked. Performed at [facility], [place] [address] [place], [place] [address]   Surgical pcr screen     Status: None   Collection Time: 09/30/20  3:47 PM   Specimen: Nasal Mucosa; Nasal Swab  Result Value Ref Range Status   MRSA, PCR NEGATIVE NEGATIVE Final   Staphylococcus aureus NEGATIVE NEGATIVE Final    Comment: (NOTE) The Xpert SA Assay (FDA approved for NASAL specimens in patients 40 years of age and older), is one component of a comprehensive surveillance program. It is not intended to diagnose infection nor to guide or monitor treatment. Performed at [facility], [place] [address], [place], [place] [address]     Radiology Studies: No results found.    [name]  If 7PM-7AM, please contact night-coverage [email or web address] 10/01/2020, 3:10 PM

## 2020-10-01 NOTE — Progress Notes (Signed)
Patient underwent bilateral hand incision and drainage with primary wound closure over a drain on the left side and sterile dressing on right hand.  Patient will need to start daily wet-to-dry dressing changes on the right hand starting on Monday.  Patient's left hand wound can be changed at his first postop visit in my office next week.  Intraoperative cultures are pending.  Please contact me over the weekend if there are any significant changes in the patient's condition.

## 2020-10-01 NOTE — Progress Notes (Signed)
   10/01/20 1340  Assess: MEWS Score  Temp 98.2 F (36.8 C)  BP (!) 208/79  Pulse Rate 68  Resp 14  Level of Consciousness Alert  SpO2 96 %  O2 Device Room Air  Assess: MEWS Score  MEWS Temp 0  MEWS Systolic 2  MEWS Pulse 0  MEWS RR 0  MEWS LOC 0  MEWS Score 2  MEWS Score Color Yellow  Treat  MEWS Interventions Escalated (See documentation below)  Pain Scale 0-10  Pain Score 4  Pain Type Surgical pain  Pain Location Hand  Pain Orientation Right;Left  Take Vital Signs  Increase Vital Sign Frequency  Yellow: Q 2hr X 2 then Q 4hr X 2, if remains yellow, continue Q 4hrs  Escalate  MEWS: Escalate Yellow: discuss with charge nurse/RN and consider discussing with provider and RRT  Notify: Charge Nurse/RN  Name of Charge Nurse/RN Notified [employer], RN  Date Charge Nurse/RN Notified 10/01/20  Time Charge Nurse/RN Notified 3500  Notify: Provider  Provider Name/Title Wendee Beavers, MD  Date Provider Notified 10/01/20  Time Provider Notified 1340  Notification Type Page  Notification Reason Change in status  Response See new orders  Date of Provider Response 10/01/20  Time of Provider Response 1342  Document  Patient Outcome Stabilized after interventions  Progress note created (see row info) Yes

## 2020-10-01 NOTE — Progress Notes (Signed)
Pharmacy Antibiotic Note  [name] is a 59 y.o. male admitted on 09/30/2020 with left hand ischemia.  Pertinent history significant for ESRD on MWF HD, bilateral BKAs and multiple finger amputations.  Patient is s/p OR for washout and Pharmacy has been consulted for Septra dosing.  Received HD yesterday to support OR visit today.  Afebrile, WBC 11.1, K+ low at 2.6.  Plan: Septra 1 DS tab x 1, then 1SS tab PO BID Monitor HD schedule/tolerance, clinical progress, K+  Height: 6\' 4"  (193 cm) Weight: 88.1 kg (194 lb 3.6 oz) IBW/kg (Calculated) : 86.8  Temp (24hrs), Avg:98.3 F (36.8 C), Min:97.7 F (36.5 C), Max:98.7 F (37.1 C)  Recent Labs  Lab 09/30/20 1747 10/01/20 0354 10/01/20 1047  WBC 10.6* 11.1*  --   CREATININE 10.60* 5.75* 6.80*    Estimated Creatinine Clearance: 14 mL/min (A) (by C-G formula based on SCr of 6.8 mg/dL (H)).    No Known Allergies  Septra 10/29 >>  10/28 surgical PCR - negative 10/29 wound cx (deep) -   [name] Emanuele D. Mina [name], PharmD, BCPS, [place] [facility] 10/01/2020, 3:21 PM

## 2020-10-01 NOTE — Op Note (Signed)
Patient was taken the operating suite and after induction of adequate general anesthetic both the right and left upper extremities were prepped and draped in usual sterile fashion.  The right hand underwent incision and drainage of a chronic wound on dorsal aspect status post amputations in the past.  After thorough irrigation the wound was dressed with Xeroform, 4 x 4's, compression bandage and Ace wrap.  We then approached the left hand over the index metacarpal phalangeal joint where previous metacarpal phalange joint level amputation had been performed.  Cultures were sent.  The wound was thoroughly irrigated and then loosely closed over 1/4 inch Penrose drain in layers of 4-0 Vicryl to cover the metacarpal head followed by 4-0 nylon on the skin.  Xeroform, 4 x 4's, and a compression bandage was applied.  The patient tolerated these bilateral procedures well went recovery in stable fashion.
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
## 2020-10-01 NOTE — Anesthesia Preprocedure Evaluation (Addendum)
Anesthesia Evaluation  Patient identified by MRN, date of birth, ID band Patient awake    Reviewed: Allergy & Precautions, NPO status , Patient's Chart, lab work & pertinent test results, reviewed documented beta blocker date and time   History of Anesthesia Complications Negative for: history of anesthetic complications  Airway Mallampati: II  TM Distance: >3 FB Neck ROM: Full    Dental  (+) Upper Dentures, Edentulous Lower, Edentulous Upper   Pulmonary former smoker,  09/30/2020 SARS coronavirus NEG   breath sounds clear to auscultation       Cardiovascular hypertension, Pt. on medications and Pt. on home beta blockers (-) angina+ Peripheral Vascular Disease   Rhythm:Regular Rate:Normal     Neuro/Psych    GI/Hepatic GERD  Medicated and Controlled,(+) Hepatitis -, C  Endo/Other  diabetes, Insulin Dependent, Oral Hypoglycemic Agents  Renal/GU Dialysis and ESRFRenal disease (MWF dialysis)K+ 2.6: received K+     Musculoskeletal   Abdominal   Peds  Hematology  (+) Blood dyscrasia (Hb 8.3), anemia ,   Anesthesia Other Findings   Reproductive/Obstetrics                            Anesthesia Physical Anesthesia Plan  ASA: III  Anesthesia Plan: General   Post-op Pain Management:    Induction: Intravenous  PONV Risk Score and Plan: 2 and Ondansetron and Dexamethasone  Airway Management Planned: LMA  Additional Equipment: None  Intra-op Plan:   Post-operative Plan:   Informed Consent: I have reviewed the patients History and Physical, chart, labs and discussed the procedure including the risks, benefits and alternatives for the proposed anesthesia with the patient or authorized representative who has indicated his/her understanding and acceptance.   Patient has DNR.  Discussed DNR with patient and Suspend DNR.     Plan Discussed with: CRNA and Surgeon  Anesthesia Plan Comments:         Anesthesia Quick Evaluation
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
## 2020-10-01 NOTE — Progress Notes (Signed)
Notified on-call for TRIAD-K+- 2.6 new orders recieved

## 2020-10-01 NOTE — Anesthesia Postprocedure Evaluation (Signed)
Anesthesia Post Note  Patient: [name]  Procedure(s) Performed: IRRIGATION AND DEBRIDEMENT WOUND RIGHT HAND (Right )     Patient location during evaluation: PACU Anesthesia Type: General Level of consciousness: awake and alert, patient cooperative and oriented Pain management: pain level controlled (pain much improved) Vital Signs Assessment: post-procedure vital signs reviewed and stable Respiratory status: spontaneous breathing, nonlabored ventilation and respiratory function stable Cardiovascular status: blood pressure returned to baseline and stable Postop Assessment: no apparent nausea or vomiting Anesthetic complications: no   No complications documented.  Last Vitals:  Vitals:   10/01/20 1340 10/01/20 1445  BP: (!) 208/79 (!) 172/71  Pulse: 68 68  Resp: 14 16  Temp: 36.8 C 36.4 C  SpO2: 96% 98%    Last Pain:  Vitals:   10/01/20 1445  TempSrc: Oral  PainSc:                  Andreus Cure,E. Kymari Nuon

## 2020-10-01 NOTE — Brief Op Note (Signed)
09/30/2020 - 10/01/2020  11:52 AM  PATIENT:  [name]  59 y.o. male  PRE-OPERATIVE DIAGNOSIS:  RIGHT HAND INFECTION  POST-OPERATIVE DIAGNOSIS:  RIGHT HAND INFECTION  PROCEDURE:  Procedure(s): IRRIGATION AND DEBRIDEMENT WOUND RIGHT HAND (Right)  SURGEON:  Surgeon(s) and Role:    * Charlotte Crumb, MD - Primary  PHYSICIAN ASSISTANT: Robert Dasnoit PA-C  ASSISTANTS: As above  ANESTHESIA:   general  EBL:  10 mL   BLOOD ADMINISTERED:none  DRAINS: Penrose drain in the Left hand   LOCAL MEDICATIONS USED:  NONE  SPECIMEN:  No Specimen  DISPOSITION OF SPECIMEN:  N/A  COUNTS:  YES  TOURNIQUET:  * No tourniquets in log *  DICTATION: .Dragon Dictation  PLAN OF CARE: Admit to inpatient   PATIENT DISPOSITION:  PACU - hemodynamically stable.   Delay start of Pharmacological VTE agent (>24hrs) due to surgical blood loss or risk of bleeding: not applicable
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
## 2020-10-01 NOTE — Anesthesia Procedure Notes (Signed)
Procedure Name: LMA Insertion Date/Time: 10/01/2020 11:11 AM Performed by: Leonor Liv, CRNA Pre-anesthesia Checklist: Patient identified, Emergency Drugs available, Suction available and Patient being monitored Patient Re-evaluated:Patient Re-evaluated prior to induction Oxygen Delivery Method: Circle System Utilized Preoxygenation: Pre-oxygenation with 100% oxygen Induction Type: IV induction Ventilation: Mask ventilation without difficulty LMA: LMA inserted LMA Size: 4.0 Number of attempts: 1 Airway Equipment and Method: Bite block Placement Confirmation: positive ETCO2 Tube secured with: Tape Dental Injury: Teeth and Oropharynx as per pre-operative assessment

## 2020-10-02 ENCOUNTER — Encounter (HOSPITAL_COMMUNITY): Payer: Self-pay | Admitting: Orthopedic Surgery

## 2020-10-02 DIAGNOSIS — Z515 Encounter for palliative care: Secondary | ICD-10-CM

## 2020-10-02 LAB — RENAL FUNCTION PANEL
Albumin: 2.7 g/dL — ABNORMAL LOW (ref 3.5–5.0)
Anion gap: 16 — ABNORMAL HIGH (ref 5–15)
BUN: 41 mg/dL — ABNORMAL HIGH (ref 8–23)
CO2: 24 mmol/L (ref 22–32)
Calcium: 8.3 mg/dL — ABNORMAL LOW (ref 8.9–10.3)
Chloride: 95 mmol/L — ABNORMAL LOW (ref 98–111)
Creatinine, Ser: 7.76 mg/dL — ABNORMAL HIGH (ref 0.61–1.24)
GFR, Estimated: 7 mL/min — ABNORMAL LOW (ref >60)
Glucose, Bld: 107 mg/dL — ABNORMAL HIGH (ref 70–99)
Phosphorus: 7.7 mg/dL — ABNORMAL HIGH (ref 2.5–4.6)
Potassium: 3.4 mmol/L — ABNORMAL LOW (ref 3.5–5.1)
Sodium: 135 mmol/L (ref 135–145)

## 2020-10-02 LAB — CBC WITH DIFFERENTIAL/PLATELET
Abs Immature Granulocytes: 0.05 10*3/uL (ref 0.00–0.07)
Basophils Absolute: 0.1 10*3/uL (ref 0.0–0.1)
Basophils Relative: 1 %
Eosinophils Absolute: 0 10*3/uL (ref 0.0–0.5)
Eosinophils Relative: 0 %
HCT: 28.8 % — ABNORMAL LOW (ref 39.0–52.0)
Hemoglobin: 8.8 g/dL — ABNORMAL LOW (ref 13.0–17.0)
Immature Granulocytes: 1 %
Lymphocytes Relative: 17 %
Lymphs Abs: 1.6 10*3/uL (ref 0.7–4.0)
MCH: 23.2 pg — ABNORMAL LOW (ref 26.0–34.0)
MCHC: 30.6 g/dL (ref 30.0–36.0)
MCV: 76 fL — ABNORMAL LOW (ref 80.0–100.0)
Monocytes Absolute: 1 10*3/uL (ref 0.1–1.0)
Monocytes Relative: 10 %
Neutro Abs: 6.8 10*3/uL (ref 1.7–7.7)
Neutrophils Relative %: 71 %
Platelets: 173 10*3/uL (ref 150–400)
RBC: 3.79 MIL/uL — ABNORMAL LOW (ref 4.22–5.81)
RDW: 18.6 % — ABNORMAL HIGH (ref 11.5–15.5)
WBC: 9.6 10*3/uL (ref 4.0–10.5)
nRBC: 0 % (ref 0.0–0.2)

## 2020-10-02 LAB — MAGNESIUM: Magnesium: 1.8 mg/dL (ref 1.7–2.4)

## 2020-10-02 LAB — GLUCOSE, CAPILLARY
Glucose-Capillary: 120 mg/dL — ABNORMAL HIGH (ref 70–99)
Glucose-Capillary: 88 mg/dL (ref 70–99)
Glucose-Capillary: 90 mg/dL (ref 70–99)
Glucose-Capillary: 102 mg/dL — ABNORMAL HIGH (ref 70–99)

## 2020-10-02 MED ORDER — OXYCODONE HCL 5 MG PO TABS
5.0000 mg | ORAL_TABLET | ORAL | Status: DC | PRN
  Administered 2020-10-02 (×2): 10 mg via ORAL
  Filled 2020-10-02 (×2): qty 2

## 2020-10-02 MED ORDER — SENNOSIDES-DOCUSATE SODIUM 8.6-50 MG PO TABS: 1.0000 | ORAL_TABLET | Freq: Two times a day (BID) | ORAL | Status: DC | PRN

## 2020-10-02 MED ORDER — POLYETHYLENE GLYCOL 3350 17 G PO PACK: 17.0000 g | PACK | Freq: Two times a day (BID) | ORAL | Status: DC | PRN

## 2020-10-02 MED ORDER — HYDRALAZINE HCL 25 MG PO TABS
25.0000 mg | ORAL_TABLET | Freq: Three times a day (TID) | ORAL | Status: DC
Start: 2020-10-02 — End: 2020-10-02

## 2020-10-02 MED ORDER — AMLODIPINE BESYLATE 10 MG PO TABS
10.0000 mg | ORAL_TABLET | Freq: Every day | ORAL | Status: DC
  Administered 2020-10-02 – 2020-10-06 (×4): 10 mg via ORAL
  Filled 2020-10-02 (×4): qty 1

## 2020-10-02 MED ORDER — MORPHINE SULFATE (PF) 2 MG/ML IV SOLN
2.0000 mg | INTRAVENOUS | Status: DC | PRN
  Administered 2020-10-03 – 2020-10-04 (×4): 2 mg via INTRAVENOUS
  Filled 2020-10-02 (×4): qty 1

## 2020-10-02 MED ORDER — OXYCODONE HCL 5 MG PO TABS
10.0000 mg | ORAL_TABLET | Freq: Four times a day (QID) | ORAL | Status: DC | PRN
  Administered 2020-10-03 (×3): 15 mg via ORAL
  Filled 2020-10-02 (×4): qty 3
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
## 2020-10-02 NOTE — TOC Initial Note (Addendum)
Transition of Care [facility]) - Initial/Assessment Note    Patient Details  Name: [name] MRN: [id number] Date of Birth: 1961/05/22  Transition of Care [facility]) CM/SW Contact:    [name] Mons, RN Phone Number: 10/02/2020, 9:55 AM  Clinical Narrative:         Presents with recurrent bilateral hand wounds/ ischemia/ infection. Hx of PVD s/p B BKA, DM, HTN, and ESRD on MWF HD. From home with supportive  wife. States requires assistance with ADL's. Pt with prosthetic legs, rolling walker, BSC, manuel w/c.             -  S/p I& D R hand wound, 10/29  NCM spoke with pt in regard to d/c planning. Pt agreeable to home health sources. Choice offered for agency, pt without preference. Referral made with [facility] ( RN,PT,OT,NA,SW)and accepted. [facility] to also help navigate PCS with pt's Medicaid. Pt will need face to face completed from MD for [facility] services, MD aware. Referral made with [facility] for out patient palliative care by voice message after receiving pt's consent ... choice was given prior, pt without  preference.   Consult received : Social work - patient needs help getting in home support services from [employer] and a Social work - patient needs help getting in home support services from [facility] and a motorized wheelchair. NCM to f/u with [facility] concerning process for obtaining motorized w/c.  Pt states has transportation to home and no concern affording Rx meds once d/c.  TOC  Team will continue to monitor and follow for needs .......   Expected Discharge Plan: [place] Barriers to Discharge: Continued Medical Work up   Patient Goals and CMS Choice     Choice offered to / list presented to : Patient  Expected Discharge Plan and Services Expected Discharge Plan: [place]   Discharge Planning Services: CM Consult Post Acute Care Choice: Durable Medical Equipment Living arrangements for the past 2 months:  Single Family Home                           HH Arranged: RN, PT, OT, Nurse's Aide, Social Work   Date [place]: 10/02/20 Time [place]: [phone] Representative spoke with at [place] Arrangements/Services Living arrangements for the past 2 months: [place] with:: Spouse Patient language and need for interpreter reviewed:: Yes Do you feel safe going back to the place where you live?: Yes      Need for Family Participation in Patient Care: Yes (Comment) Care giver support system in place?: Yes (comment) Current home services: DME Criminal Activity/Legal Involvement Pertinent to Current Situation/Hospitalization: No - Comment as needed  Activities of Daily Living Home Assistive Devices/Equipment: Cane (specify quad or straight), Eyeglasses, Walker (specify type), Prosthesis ADL Screening (condition at time of admission) Patient's cognitive ability adequate to safely complete daily activities?: Yes Is the patient deaf or have difficulty hearing?: No Does the patient have difficulty seeing, even when wearing glasses/contacts?: Yes Does the patient have difficulty concentrating, remembering, or making decisions?: No Patient able to express need for assistance with ADLs?: No Does the patient have difficulty dressing or bathing?: No Independently performs ADLs?: Yes (appropriate for developmental age) Does the patient have difficulty walking or climbing stairs?: Yes Weakness of Legs: Both Weakness of Arms/Hands: Both  Permission Sought/Granted   Permission granted to share information with :  Yes, Release of Information Signed  Share Information with NAME: [name] (Spouse) [phone]           Emotional Assessment Appearance:: Appears stated age Attitude/Demeanor/Rapport: Engaged Affect (typically observed): Accepting Orientation: : Oriented to Self, Oriented to Place, Oriented to  Time, Oriented to  Situation Alcohol / Substance Use: Not Applicable Psych Involvement: No (comment)  Admission diagnosis:  Ischemia of digits of hand [I99.8] Patient Active Problem List   Diagnosis Date Noted  . Ischemia of digits of hand 09/30/2020  . Goals of care, counseling/discussion 09/30/2020  . DNR (do not resuscitate) 09/30/2020  . Gangrene of finger of right hand ([place]) 05/26/2020  . Diabetic neuropathy ([place])   . Ischemia of finger 04/23/2020  . Sepsis ([place]) 03/11/2020  . Fever 03/10/2020  . Acute metabolic encephalopathy 80/88/1103  . Chronic hepatitis C without hepatic coma ([place]) 10/27/2019  . Unilateral complete BKA, right, initial encounter ([place]) 11/01/2018  . Diabetes mellitus type 2 in nonobese (HCC)   . Postoperative pain   . Neuropathic pain   . PVD (peripheral vascular disease) ([place]) 10/23/2018  . Hypokalemia 10/23/2018  . Chronic anemia 10/23/2018  . Ambulatory dysfunction 10/23/2018  . GERD (gastroesophageal reflux disease) 10/23/2018  . Right foot pain 10/22/2018  . Hypocalcemia 09/09/2018  . Dehydration 09/09/2018  . Amputation of left lower extremity below knee ([place]) 06/04/2017  . Unilateral complete BKA, left, sequela ([place])   . Abnormality of gait   . Phantom limb pain ([place])   . End stage renal disease on dialysis ([place])   . Type 2 diabetes mellitus ([place])   . HLD (hyperlipidemia)   . Drug-induced constipation   . S/P bilateral BKA (below knee amputation) ([place])   . Malignant hypertension   . Post-operative pain   . Acute blood loss anemia   . S/P BKA (below knee amputation), left ([place]) 05/30/2017  . Infection of right hand 03/30/2017  . Hyponatremia 03/30/2017  . Necrosis of toe ([place]) 12/18/2013  . Osteomyelitis ([place]) 12/18/2013  . Leukocytosis 12/18/2013  . Anemia of chronic disease 12/18/2013   PCP:  [name] Mowers, FNP Pharmacy:   [id number] - [place], [place] [address] Phone: [phone] Fax: [phone]     Social Determinants of Health ([place]) Interventions    Readmission Risk Interventions Readmission Risk Prevention Plan 03/16/2020  Transportation Screening Complete  PCP or Specialist Appt within 3-5 Days (No Data)  [place] or [place] (No Data)  Medication Review (RN Care Manager) Complete  Some recent data might be hidden

## 2020-10-02 NOTE — Progress Notes (Signed)
PROGRESS NOTE  [name] DUK:[id number] DOB: November 15, 1961   PCP: [name], FNP  Patient is from: Home.  Lives his wife.  DOA: 09/30/2020 LOS: 2  Chief complaints: Left hand pain/ischemia  Brief Narrative / Interim history: 59 year old male with history of ESRD on HD MWF, DM-2, HTN, PVD s/p bilateral BKA with BKA wounds and multiple finger amputations with recent left index finger amputation sent to ED by Dr. Burney Gauze for I&D and debridement of right hand wound.  Patient is also followed by ID, Dr. Megan Salon outpatient.  He is on Bactrim outpatient.  Nephrology consulted.  He had HD on 10/28. Patient underwent irrigation and debridement of right hand wound by Dr. Burney Gauze on 10/01/2020.  Per Dr. Burney Gauze, patient need daily home health RN for dressing change and wound care when he is ready for discharge.  Subjective: Seen and examined earlier this morning.  He reports severe pain in his left hand.  Also significant pain in his right hand and right BKA.  He rates his pain 10/10 although he does not appear to be in that much pain.  Denies chest pain, dyspnea, GI or UTI symptoms.  Objective: Vitals:   10/02/20 0008 10/02/20 0417 10/02/20 0732 10/02/20 0940  BP: (!) 163/81 (!) 167/73 (!) 172/77 (!) 169/85  Pulse: 74 69 70 68  Resp: 16 14 14    Temp: 98.6 F (37 C) 98.4 F (36.9 C) 98.2 F (36.8 C) 98.2 F (36.8 C)  TempSrc: Oral Oral Oral Oral  SpO2: 98% 100% 100%   Weight:      Height:        Intake/Output Summary (Last 24 hours) at 10/02/2020 1506 Last data filed at 10/02/2020 0300 Gross per 24 hour  Intake 200 ml  Output --  Net 200 ml   Filed Weights   09/30/20 1940 10/01/20 1050  Weight: 88.1 kg 88.1 kg    Examination:  GENERAL: No apparent distress.  Nontoxic. HEENT: MMM.  Vision and hearing grossly intact.  NECK: Supple.  No apparent JVD.  RESP:  No IWOB.  Fair aeration bilaterally. CVS:  RRR. Heart sounds normal.  ABD/GI/GU: BS+. Abd soft, NTND.   MSK/EXT: Bilateral BKA. S/p multiple finger amputations in both hands.  Dressing over both arms. SKIN: Chronic wounds over right and left BKA without signs of infection or drainage. NEURO: Awake, alert and oriented appropriately.  No apparent focal neuro deficit. PSYCH: Calm. Normal affect.     Procedures:  10/01/2020-irrigation and debridement of right hand wound by Dr. Burney Gauze  Microbiology summarized: COVID-19 PCR negative. Influenza PCR negative.  Surgical tissue culture pending.  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 Burney Gauze on 10/01/2020 -ID following-currently on home Bactrim -Follow surgical tissue culture -Patient needs daily wound dressing on discharge per Dr. Burney Gauze -[facility] consulted for home health. -Appreciate input by palliative medicine-outpatient palliative follow-up -Continue Lyrica, oxycodone with as needed IV morphine.  ESRD on HD MWF: Dialyzed on 09/30/2020 off schedule. -HD per nephrology  Malignant HTN: Elevated SBP to 160s.  Could be driven by pain. -Continue home Coreg 12.5 mg twice daily -Add amlodipine 10 mg daily yesterday -As needed hydralazine with parameters.  Hypokalemia: K2.6> 3.4 -Recheck in the morning. Won't supplement while on Bactrim in ESRD patient  Hyperlipidemia -Continue Lipitor  Controlled DM-2 with neuropathy: A1c 5.3%. Recent Labs  Lab 10/01/20 1405 10/01/20 1709 10/01/20 2034 10/02/20 3762  10/02/20 1156  GLUCAP 128* 185* 113* 120* 88  -Continue SSI-renal  Goals of care/DNR/DNI-appropriate.  Poor long-term prognosis -Appreciate input by palliative-outpatient follow-up  Lower extremity wound/BKA wound: Noted wounds on left and right BKA.  Unstageable.  No signs of infection. -Wound care consult  Debility: Bilateral AKA with multiple finger amputation. -PT/OT   Body mass index is 23.64  kg/m.           DVT prophylaxis:  heparin injection 5,000 Units Start: 09/30/20 1900  Code Status: DNR/DNI Family Communication: Patient and/or RN. Available if any question.  Status is: Inpatient  Remains inpatient appropriate because:Ongoing active pain requiring inpatient pain management, IV treatments appropriate due to intensity of illness or inability to take PO and Inpatient level of care appropriate due to severity of illness   Dispo: The patient is from: Home              Anticipated d/c is to: Home              Anticipated d/c date is: 2 days after surgical wound culture and clearance by consultants              Patient currently is not medically stable to d/c.       Consultants:  Surgery Infectious disease Nephrology Palliative medicine   Sch Meds:  Scheduled Meds: . amLODipine  10 mg Oral Daily  . atorvastatin  20 mg Oral QHS  . carvedilol  12.5 mg Oral Q breakfast  . carvedilol  12.5 mg Oral Once per day on Sun Tue Thu Sat  . Chlorhexidine Gluconate Cloth  6 each Topical Q0600  . docusate sodium  100 mg Oral BID  . heparin  5,000 Units Subcutaneous Q8H  . insulin aspart  0-6 Units Subcutaneous TID WC  . multivitamin  1 tablet Oral Q M,W,F-HD  . pregabalin  100 mg Oral QHS  . sevelamer carbonate  2,400 mg Oral TID WC  . sulfamethoxazole-trimethoprim  1 tablet Oral Q12H   Continuous Infusions: . sodium chloride    . sodium chloride     PRN Meds:.sodium chloride, sodium chloride, acetaminophen **OR** acetaminophen, albuterol, bisacodyl, calcium carbonate (dosed in mg elemental calcium), camphor-menthol **AND** hydrOXYzine, docusate sodium, feeding supplement (NEPRO CARB STEADY), heparin, hydrALAZINE, lidocaine (PF), lidocaine-prilocaine, morphine injection, ondansetron **OR** ondansetron (ZOFRAN) IV, oxyCODONE, pentafluoroprop-tetrafluoroeth, polyethylene glycol, sorbitol, zolpidem  Antimicrobials: Anti-infectives (From admission, onward)   Start      Dose/Rate Route Frequency Ordered Stop   10/01/20 2200  sulfamethoxazole-trimethoprim (BACTRIM) 400-80 MG per tablet 1 tablet        1 tablet Oral Every 12 hours 10/01/20 1524     10/01/20 1615  sulfamethoxazole-trimethoprim (BACTRIM DS) 800-160 MG per tablet 1 tablet        1 tablet Oral  Once 10/01/20 1524 10/01/20 1556   10/01/20 0815  ceFAZolin (ANCEF) IVPB 2g/100 mL premix  Status:  Discontinued        2 g 200 mL/hr over 30 Minutes Intravenous On call to O.R. 10/01/20 0725 10/01/20 1313       I have personally reviewed the following labs and images: CBC: Recent Labs  Lab 09/30/20 1747 10/01/20 0354 10/01/20 1047 10/02/20 0228  WBC 10.6* 11.1*  --  9.6  NEUTROABS  --   --   --  6.8  HGB 8.1* 8.3* 10.2* 8.8*  HCT 26.8* 27.6* 30.0* 28.8*  MCV 75.5* 75.0*  --  76.0*  PLT 186 162  --  173  BMP &GFR Recent Labs  Lab 09/30/20 1747 10/01/20 0354 10/01/20 1047 10/02/20 0228  NA 140 135 136 135  K 3.6 2.6* 3.1* 3.4*  CL 100 94* 94* 95*  CO2 22 27  --  24  GLUCOSE 105* 93 114* 107*  BUN 65* 23 27* 41*  CREATININE 10.60* 5.75* 6.80* 7.76*  CALCIUM 8.4* 8.1*  --  8.3*  MG  --   --   --  1.8  PHOS 8.3*  --   --  7.7*   Estimated Creatinine Clearance: 12.3 mL/min (A) (by C-G formula based on SCr of 7.76 mg/dL (H)). Liver & Pancreas: Recent Labs  Lab 09/30/20 1747 10/02/20 0228  ALBUMIN 2.8* 2.7*   No results for input(s): LIPASE, AMYLASE in the last 168 hours. No results for input(s): AMMONIA in the last 168 hours. Diabetic: No results for input(s): HGBA1C in the last 72 hours. Recent Labs  Lab 10/01/20 1405 10/01/20 1709 10/01/20 2034 10/02/20 0639 10/02/20 1156  GLUCAP 128* 185* 113* 120* 88   Cardiac Enzymes: No results for input(s): CKTOTAL, CKMB, CKMBINDEX, TROPONINI in the last 168 hours. No results for input(s): PROBNP in the last 8760 hours. Coagulation Profile: Recent Labs  Lab 09/30/20 1747  INR 1.2   Thyroid Function Tests: No results for  input(s): TSH, T4TOTAL, FREET4, T3FREE, THYROIDAB in the last 72 hours. Lipid Profile: No results for input(s): CHOL, HDL, LDLCALC, TRIG, CHOLHDL, LDLDIRECT in the last 72 hours. Anemia Panel: No results for input(s): VITAMINB12, FOLATE, FERRITIN, TIBC, IRON, RETICCTPCT in the last 72 hours. Urine analysis:    Component Value Date/Time   COLORURINE YELLOW 09/10/2018 1927   APPEARANCEUR HAZY (A) 09/10/2018 1927   LABSPEC 1.011 09/10/2018 1927   PHURINE 5.0 09/10/2018 1927   GLUCOSEU 50 (A) 09/10/2018 1927   HGBUR MODERATE (A) 09/10/2018 1927   BILIRUBINUR NEGATIVE 09/10/2018 1927   KETONESUR NEGATIVE 09/10/2018 1927   PROTEINUR 100 (A) 09/10/2018 1927   NITRITE NEGATIVE 09/10/2018 1927   LEUKOCYTESUR NEGATIVE 09/10/2018 1927   Sepsis Labs: Invalid input(s): PROCALCITONIN, [place]  Microbiology: Recent Results (from the past 240 hour(s))  Respiratory Panel by RT PCR (Flu A&B, Covid) - Nasopharyngeal Swab     Status: None   Collection Time: 09/30/20  3:37 PM   Specimen: Nasopharyngeal Swab  Result Value Ref Range Status   SARS Coronavirus 2 by RT PCR NEGATIVE NEGATIVE Final    Comment: (NOTE) SARS-CoV-2 target nucleic acids are NOT DETECTED.  The SARS-CoV-2 RNA is generally detectable in upper respiratoy specimens during the acute phase of infection. The lowest concentration of SARS-CoV-2 viral copies this assay can detect is 131 copies/mL. A negative result does not preclude SARS-Cov-2 infection and should not be used as the sole basis for treatment or other patient management decisions. A negative result may occur with  improper specimen collection/handling, submission of specimen other than nasopharyngeal swab, presence of viral mutation(s) within the areas targeted by this assay, and inadequate number of viral copies (<131 copies/mL). A negative result must be combined with clinical observations, patient history, and epidemiological information. The expected result is  Negative.  Fact Sheet for Patients:  PinkCheek.be  Fact Sheet for Healthcare Providers:  GravelBags.it  This test is no t yet approved or cleared by the [place] FDA and  has been authorized for detection and/or diagnosis of SARS-CoV-2 by FDA under an Emergency Use Authorization (EUA). This EUA will remain  in effect (meaning this test can be used) for the duration of  the COVID-19 declaration under Section 564(b)(1) of the Act, 21 U.S.C. section 360bbb-3(b)(1), unless the authorization is terminated or revoked sooner.     Influenza A by PCR NEGATIVE NEGATIVE Final   Influenza B by PCR NEGATIVE NEGATIVE Final    Comment: (NOTE) The Xpert Xpress SARS-CoV-2/FLU/RSV assay is intended as an aid in  the diagnosis of influenza from Nasopharyngeal swab specimens and  should not be used as a sole basis for treatment. Nasal washings and  aspirates are unacceptable for Xpert Xpress SARS-CoV-2/FLU/RSV  testing.  Fact Sheet for Patients: PinkCheek.be  Fact Sheet for Healthcare Providers: GravelBags.it  This test is not yet approved or cleared by the [place] FDA and  has been authorized for detection and/or diagnosis of SARS-CoV-2 by  FDA under an Emergency Use Authorization (EUA). This EUA will remain  in effect (meaning this test can be used) for the duration of the  Covid-19 declaration under Section 564(b)(1) of the Act, 21  U.S.C. section 360bbb-3(b)(1), unless the authorization is  terminated or revoked. Performed at [facility], [place] [address], [place], [place] [address]   Surgical pcr screen     Status: None   Collection Time: 09/30/20  3:47 PM   Specimen: Nasal Mucosa; Nasal Swab  Result Value Ref Range Status   MRSA, PCR NEGATIVE NEGATIVE Final   Staphylococcus aureus NEGATIVE NEGATIVE Final    Comment: (NOTE) The Xpert SA Assay (FDA approved  for NASAL specimens in patients 25 years of age and older), is one component of a comprehensive surveillance program. It is not intended to diagnose infection nor to guide or monitor treatment. Performed at [facility], [place] [address], [place], [place] [address]   Aerobic/Anaerobic Culture (surgical/deep wound)     Status: None (Preliminary result)   Collection Time: 10/01/20 11:40 AM   Specimen: Soft Tissue, Other  Result Value Ref Range Status   Specimen Description WOUND  Final   Special Requests LEFT HAND SPEC A  Final   Gram Stain   Final    RARE WBC PRESENT, PREDOMINANTLY MONONUCLEAR NO ORGANISMS SEEN    Culture   Final    FEW PROTEUS PENNERI SUSCEPTIBILITIES TO FOLLOW Performed at [facility], [place] [address], [place], [place] [address]    Report Status PENDING  Incomplete    Radiology Studies: No results found.    Signora Zucco T. [place]  If 7PM-7AM, please contact night-coverage [email or web address] 10/02/2020, 3:06 PM

## 2020-10-02 NOTE — Evaluation (Signed)
Physical Therapy Evaluation Patient Details Name: [name] MRN: [id number] DOB: 1961/10/24 Today's Date: 10/02/2020   History of Present Illness  59 y.o. male with medical history significant of PVD s/p B BKA; DM; HTN; and ESRD on MWF HD presenting for recurrent L hand ischemia/infection. His initial R interphalanx amputation was on 4/25 with R index finger amputation on 4/9, R middle and 4th finger amputations on 5/22, and L forefinger amputation on 10/4. 10/29 to OR: Rt hand I&D dorsal aspect, Lt hand I&D over index MCP joint.  Clinical Impression   Patient is s/p above surgery resulting in functional limitations due to the deficits listed below (see PT Problem List). Patient has been highly independent and ambulatory despite bil BKA's and bil hand surgeries. He admits he is concerned his hands have not healed well previously due to continued use (dons his own prosthesis, on rollator with walking). He also has a black eschar on rt patella that causes him pain when wearing his prosthesis. Incr time discussing home set-up and problem-solving how to make his home more wheelchair accessible (they have gates for keeping dogs out of certain rooms). Lengthy education re: need to stay off RLE (not use prosthesis) in an effort to promote wound healing and potentially avoid Rt AKA. Also discussed ideally will use power chair (when he can work with a wheelchair vendor to get a wheelchair evaluation), however in the mean time need to see if he can use a manual wheelchair with primary use of [name] in prosthesis to minimize use of his hands to propel. ?use bil PFRW for access into bathroom or for maintaining his strength/ambulation using [name] prosthesis only.   Patient will benefit from skilled PT to increase their independence and safety with mobility to allow discharge to the venue listed below.       Follow Up Recommendations CIR    Equipment Recommendations  Rolling walker with 5" wheels; bil  platforms for RW; power chair (need to connect him with a wheelchair vendor for assessment--have reached out to [facility] for potential vendors that do these)    Recommendations for Other Services OT consult;Rehab consult     Precautions / Restrictions Precautions Precautions: Fall Precaution Comments: denies h/o falls Restrictions Weight Bearing Restrictions: No Other Position/Activity Restrictions: MD did not specify, however encouraged pt to minimize WB'g through hands      Mobility  Bed Mobility Overal bed mobility: Modified Independent             General bed mobility comments: HOB elevated    Transfers Overall transfer level: Needs assistance Equipment used: Bilateral platform walker Transfers: Sit to/from Stand Sit to Stand: Min assist;From elevated surface         General transfer comment: using only Rt prosthesis, pt required bed elevated with forearms in place on PFRW and PT anchoring walker; pt required total assist to tighten forearm straps on platfroms  Ambulation/Gait Ambulation/Gait assistance: Min assist Gait Distance (Feet): 35 Feet Assistive device: Bilateral platform walker Gait Pattern/deviations: Step-to pattern Gait velocity: decr   General Gait Details: using only Lt prosthesis; assist to turn/maneuver bil PFRW at times  Hotel manager mobility:  (discussed ?able to mostly use RLE (protect hands))  Modified Rankin (Stroke Patients Only)       Balance Overall balance assessment: Mild deficits observed, not formally tested  Pertinent Vitals/Pain Pain Assessment: 0-10 Pain Score: 7  Pain Location: bil hands Pain Descriptors / Indicators: Constant;Guarding;Operative site guarding Pain Intervention(s): Limited activity within patient's tolerance;Monitored during session;Patient requesting pain meds-RN  notified;RN gave pain meds during session    Home Living Family/patient expects to be discharged to:: Private residence Living Arrangements: Spouse/significant other Available Help at Discharge: Family;Available PRN/intermittently Type of Home: House Home Access: Stairs to enter Entrance Stairs-Rails: None Entrance Stairs-Number of Steps: 1 (6" threshold per pt) Home Layout: One level Home Equipment: Walker - 2 wheels;Walker - 4 wheels;Cane - quad;Crutches;Bedside commode;Wheelchair - manual Additional Comments: reports wheelchair in disrepair and ?usable (reports Medicare did not buy w/c or any of his walkers)    Prior Function Level of Independence: Independent with assistive device(s)         Comments: Has been walking with bil prosthesis and rollator (inside and outside). Takes transportation bus to HD (it is wheelchair accessible). Pt reports his landlord recently passed and their family are reclaiming the home. They are trying to find somewhere to live. They have 2 dogs.      Hand Dominance   Dominant Hand: Right    Extremity/Trunk Assessment   Upper Extremity Assessment Upper Extremity Assessment: RUE deficits/detail;LUE deficits/detail (bil hands wrapped gauze and ace wraps; can use thumbs, finge)    Lower Extremity Assessment Lower Extremity Assessment: RLE deficits/detail;[name] deficits/detail RLE Deficits / Details: BKA; black eschar with pink/yellow edges over rt patella. He reports he has been working with [employer] re: ?cause and trying to reduce friction from silicone sleeve and socks.  RLE Sensation:  (able to feel pain at wound site) [name] Deficits / Details: Lt BKA with silicone bandage on distal stump. Pt reports he put bandage on due to ?crack/fissure in his stump. Bandage removed and noted no drainage. +crescent shape fissure which pt reports is old and he thought it had split open again    Cervical / Trunk Assessment Cervical / Trunk Assessment: Normal   Communication   Communication: No difficulties  Cognition Arousal/Alertness: Awake/alert Behavior During Therapy: WFL for tasks assessed/performed Overall Cognitive Status: Within Functional Limits for tasks assessed                                 General Comments: Per Palliative note, ? patient's understanding of severity of his condition. Required firm discussion re: risk of losing his rt knee (becoming AKA) if he does not give wound a chance to heal      General Comments General comments (skin integrity, edema, etc.): Patient unsure of cause of rt patella wound. States he has been working with [employer] and has changed his liner and socks but area continues to worsen. He admits his continued insistence on walking has likely caused delayed healing in his hands and RLE    Exercises     Assessment/Plan    PT Assessment Patient needs continued PT services  PT Problem List Decreased strength;Decreased balance;Decreased mobility;Decreased knowledge of use of DME;Impaired sensation;Pain;Decreased skin integrity       PT Treatment Interventions DME instruction;Gait training;Stair training;Functional mobility training;Therapeutic activities;Therapeutic exercise;Patient/family education;Wheelchair mobility training    PT Goals (Current goals can be found in the Care Plan section)  Acute Rehab PT Goals Patient Stated Goal: wants to be as independent as possible and not rely on his wife PT Goal Formulation: With patient Time For Goal Achievement: 10/16/20 Potential to Achieve Goals: Good  Frequency Min 4X/week   Barriers to discharge Decreased caregiver support;Other (comment) reports wife's got a bad back and other health issues so he does not like to ask her to help him;    Co-evaluation               AM-PAC PT "6 Clicks" Mobility  Outcome Measure Help needed turning from your back to your side while in a flat bed without using bedrails?: None Help needed  moving from lying on your back to sitting on the side of a flat bed without using bedrails?: None Help needed moving to and from a bed to a chair (including a wheelchair)?: A Little Help needed standing up from a chair using your arms (e.g., wheelchair or bedside chair)?: A Lot Help needed to walk in hospital room?: A Little Help needed climbing 3-5 steps with a railing? : A Lot 6 Click Score: 18    End of Session Equipment Utilized During Treatment: Gait belt;Other (comment) ([name] prosthesis) Activity Tolerance: Patient tolerated treatment well Patient left: in bed;with call bell/phone within reach Nurse Communication: Patient requests pain meds PT Visit Diagnosis: Other abnormalities of gait and mobility (R26.89);Pain Pain - Right/Left:  (bil) Pain - part of body: Hand    Time: 7078-6754 PT Time Calculation (min) (ACUTE ONLY): 68 min   Charges:   PT Evaluation $PT Eval Moderate Complexity: 1 Mod PT Treatments $Gait Training: 8-22 mins $Self Care/Home Management: 38-52         Arby Barrette, PT Pager [phone]   Rexanne Mano 10/02/2020, 3:49 PM

## 2020-10-02 NOTE — Progress Notes (Signed)
[facility] Progress Note   59 y.o. male with ESRD on HD MWF, PDD, s/p b/l  BKA, multiple finger amputations/infections,  DMT2. Most recent amputation of left index finger 10/4  Followed by  ID and hand specialist.  s/p debridement of left index finger wound.   Dialysis Orders:  GO MWF 4h 67min  200NRe 425/800 EDW 84kg 2K/2Ca L AVF No heparin  Venofer 100mg  IV x 10 (until 11/15) Mircera 150 q 2 weeks (last 10/15)  Calcitriol 1.75 TIW   Assessment/ Plan:     1. PVD/L hand ischemia/infection. S/p recent amp L index finger. Plans for OR washout today  2. ESRD -  HD MWF. Dialyzed off schedule finishing very early Fri AM  in anticipation of surgery. Post HD Hypokalemia noted. Got KCl x1. Trend labs.  - Only 2 treatments this week but he refuses HD today and no absolute indication. Will plan on HD Monday if he's still here. 3. Hypertension/volume  - Blood pressures up. Have been running high as outpatient as well. EDW just lowered. On Coreg. May be aggrivated by pain as well. Challenge UF as tolerated.  4. Anemia  - Hgb 8.3 Due for ESA redose. Will give here if remains in hospital.  5. Metabolic bone disease -  Ca ok. Continue home binders  6. DMT2 -per primary   Subjective:   Pain in fingers which he states the medication is not enough. Denies f/c/n/v/ dyspnea   Objective:   BP (!) 172/77 (BP Location: Right Arm)   Pulse 70   Temp 98.2 F (36.8 C) (Oral)   Resp 14   Ht 6\' 4"  (1.93 m)   Wt 88.1 kg   SpO2 100%   BMI 23.64 kg/m   Intake/Output Summary (Last 24 hours) at 10/02/2020 0842 Last data filed at 10/02/2020 0300 Gross per 24 hour  Intake 500 ml  Output 10 ml  Net 490 ml   Weight change: 0 kg  Physical Exam: General:  Sitting up in bed, nad  Head: NCAT sclera not icteric MMM Neck: Supple. No JVD appreciated  Lungs: CTA bilaterally without wheezes, rales, or rhonchi. Breathing is unlabored. Heart: RRR with S1 S2 Abdomen: soft non-tender  Lower  extremities: bilateral BKA. No stump edema  Neuro: A & O  X 3. Moves all extremities spontaneously. Psych:  Responds to questions appropriately with a normal affect. Dialysis Access: LUE AVF +bruit   Imaging: No results found.  Labs: BMET Recent Labs  Lab 09/30/20 1747 10/01/20 0354 10/01/20 1047 10/02/20 0228  NA 140 135 136 135  K 3.6 2.6* 3.1* 3.4*  CL 100 94* 94* 95*  CO2 22 27  --  24  GLUCOSE 105* 93 114* 107*  BUN 65* 23 27* 41*  CREATININE 10.60* 5.75* 6.80* 7.76*  CALCIUM 8.4* 8.1*  --  8.3*  PHOS 8.3*  --   --  7.7*   CBC Recent Labs  Lab 09/30/20 1747 10/01/20 0354 10/01/20 1047 10/02/20 0228  WBC 10.6* 11.1*  --  9.6  NEUTROABS  --   --   --  6.8  HGB 8.1* 8.3* 10.2* 8.8*  HCT 26.8* 27.6* 30.0* 28.8*  MCV 75.5* 75.0*  --  76.0*  PLT 186 162  --  173    Medications:    . atorvastatin  20 mg Oral QHS  . carvedilol  12.5 mg Oral Q breakfast  . carvedilol  12.5 mg Oral Once per day on Sun Tue Thu Sat  .  Chlorhexidine Gluconate Cloth  6 each Topical Q0600  . docusate sodium  100 mg Oral BID  . heparin  5,000 Units Subcutaneous Q8H  . insulin aspart  0-6 Units Subcutaneous TID WC  . multivitamin  1 tablet Oral Q M,W,F-HD  . pregabalin  100 mg Oral QHS  . sevelamer carbonate  2,400 mg Oral TID WC  . sulfamethoxazole-trimethoprim  1 tablet Oral Q12H      Otelia Santee, MD 10/02/2020, 8:42 AM

## 2020-10-03 ENCOUNTER — Other Ambulatory Visit: Payer: Self-pay

## 2020-10-03 LAB — RENAL FUNCTION PANEL
Albumin: 2.6 g/dL — ABNORMAL LOW (ref 3.5–5.0)
Anion gap: 15 (ref 5–15)
BUN: 52 mg/dL — ABNORMAL HIGH (ref 8–23)
CO2: 26 mmol/L (ref 22–32)
Calcium: 8.2 mg/dL — ABNORMAL LOW (ref 8.9–10.3)
Chloride: 96 mmol/L — ABNORMAL LOW (ref 98–111)
Creatinine, Ser: 9.54 mg/dL — ABNORMAL HIGH (ref 0.61–1.24)
GFR, Estimated: 6 mL/min — ABNORMAL LOW (ref >60)
Glucose, Bld: 102 mg/dL — ABNORMAL HIGH (ref 70–99)
Phosphorus: 9.5 mg/dL — ABNORMAL HIGH (ref 2.5–4.6)
Potassium: 3.6 mmol/L (ref 3.5–5.1)
Sodium: 137 mmol/L (ref 135–145)

## 2020-10-03 LAB — MAGNESIUM: Magnesium: 2 mg/dL (ref 1.7–2.4)

## 2020-10-03 LAB — HEMOGLOBIN AND HEMATOCRIT, BLOOD
HCT: 24.9 % — ABNORMAL LOW (ref 39.0–52.0)
HCT: 26.1 % — ABNORMAL LOW (ref 39.0–52.0)
Hemoglobin: 7.5 g/dL — ABNORMAL LOW (ref 13.0–17.0)
Hemoglobin: 7.8 g/dL — ABNORMAL LOW (ref 13.0–17.0)

## 2020-10-03 LAB — GLUCOSE, CAPILLARY
Glucose-Capillary: 89 mg/dL (ref 70–99)
Glucose-Capillary: 96 mg/dL (ref 70–99)
Glucose-Capillary: 92 mg/dL (ref 70–99)

## 2020-10-03 MED ORDER — DARBEPOETIN ALFA 150 MCG/0.3ML IJ SOSY
150.0000 ug | PREFILLED_SYRINGE | INTRAMUSCULAR | Status: DC
  Filled 2020-10-03: qty 0.3

## 2020-10-03 MED ORDER — SORBITOL 70 % SOLN: 30.0000 mL | Status: DC | PRN

## 2020-10-03 MED ORDER — CALCITRIOL 1 MCG/ML IV SOLN
1.7500 ug | INTRAVENOUS | Status: DC
  Administered 2020-10-06: 1.8 ug via INTRAVENOUS
  Filled 2020-10-03 (×2): qty 1.8

## 2020-10-03 MED ORDER — AMLODIPINE BESYLATE 10 MG PO TABS
10.0000 mg | ORAL_TABLET | Freq: Every day | ORAL | 1 refills | Status: DC
Start: 2020-10-04 — End: 2020-10-14

## 2020-10-03 MED ORDER — OXYCODONE HCL 5 MG PO TABS
10.0000 mg | ORAL_TABLET | ORAL | Status: DC | PRN
  Administered 2020-10-03 – 2020-10-04 (×2): 10 mg via ORAL
  Filled 2020-10-03 (×2): qty 2

## 2020-10-03 MED ORDER — SENNOSIDES-DOCUSATE SODIUM 8.6-50 MG PO TABS: 1.0000 | ORAL_TABLET | Freq: Two times a day (BID) | ORAL | Status: AC | PRN

## 2020-10-03 NOTE — Progress Notes (Signed)
PROGRESS NOTE  [name] YQI:[id number] DOB: [birth year]   PCP: [name], FNP  Patient is from: Home.  Lives his wife.  DOA: 09/30/2020 LOS: 3  Chief complaints: Left hand pain/ischemia  Brief Narrative / Interim history: 59 year old male with history of ESRD on HD MWF, DM-2, HTN, PVD s/p bilateral BKA with BKA wounds and multiple finger amputations with recent left index finger amputation sent to ED by Dr. Burney Gauze for I&D and debridement of right hand wound.  Patient is also followed by ID, Dr. Megan Salon outpatient.  He is on Bactrim outpatient.  Nephrology consulted.  He had HD on 10/28. Patient underwent irrigation and debridement bilateral hand wounds by Dr. Burney Gauze on 10/01/2020.  Wound culture from left hand grew Proteus penneri sensitive to Bactrim that he is already taking per ID recommendation.  Per Dr. Burney Gauze, patient need daily home health RN for dressing change and wound care when he is ready for discharge.  Therapy recommended CIR.  Subjective: Seen and examined earlier this morning.  No major events overnight of this morning.  Reports improvement in his pain.  He rates his pain 5/10 this morning.  Denies chest pain, dyspnea or GI symptoms.  Reports having bowel movements yesterday.  Denies melena or hematochezia.  Objective: Vitals:   10/03/20 0015 10/03/20 0500 10/03/20 0820 10/03/20 1100  BP: 133/72 130/70 (!) 141/78 (!) 145/75  Pulse: 75 70 70 63  Resp: 19 18 17 17   Temp: 98.5 F (36.9 C) 98.5 F (36.9 C) 98.2 F (36.8 C) 98.6 F (37 C)  TempSrc: Oral Oral Oral Oral  SpO2:   96%   Weight:      Height:       No intake or output data in the 24 hours ending 10/03/20 1349 Filed Weights   09/30/20 1940 10/01/20 1050  Weight: 88.1 kg 88.1 kg    Examination:  GENERAL: No apparent distress.  Nontoxic. HEENT: MMM.  Vision and hearing grossly intact.  NECK: Supple.  No apparent JVD.  RESP:  No IWOB.  Fair aeration bilaterally. CVS:  RRR. Heart  sounds normal.  ABD/GI/GU: BS+. Abd soft, NTND.  MSK/EXT: Bilateral BKA. S/p multiple finger amputations in both hands.  Dressing over both hands. SKIN: Chronic wounds over the right and left BKA without signs of infection or drainage. NEURO: Awake, alert and oriented appropriately.  No apparent focal neuro deficit. PSYCH: Calm. Normal affect.    Procedures:  10/01/2020-irrigation and debridement of right hand wound by Dr. Burney Gauze  Microbiology summarized: COVID-19 PCR negative. Influenza PCR negative.  Surgical tissue culture from left hand grew Proteus penneri sensitive to Bactrim.  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 Burney Gauze on 10/01/2020 -Surgical tissue culture as above. -ID following-currently on home Bactrim single strength -Per Dr. Burney Gauze daily WTD dressing for right hand starting 11/1, and dressing change in office for left  -Appreciate input by palliative medicine-outpatient palliative follow-up -Continue Lyrica, oxycodone with as needed IV morphine. -Therapy recommended CIR.  Patient is interested as well.  CIR consulted.  ESRD on HD MWF: Dialyzed on 09/30/2020 off schedule. -HD per nephrology  Malignant HTN: BP improved. -Continue home Coreg 12.5 mg twice daily -Added amlodipine 10 mg daily on 10/29. -As needed hydralazine with parameters.  Hypokalemia: K2.6> 3.6.  Resolved.  Hyperlipidemia -Continue Lipitor  Controlled DM-2 with neuropathy: A1c 5.3%. Recent Labs  Lab 10/02/20 1156 10/02/20 1602 10/02/20 2059 10/03/20 0703 10/03/20 1204  GLUCAP 88 90 102* 89 96  -Continue SSI-renal  Goals of care/DNR/DNI-appropriate.  -Appreciate input by palliative-outpatient follow-up  LE wound/BKA wound: Unstageable.  No signs of infection. -Wound care consult  Debility: Bilateral AKA with multiple finger  amputation. -PT recommended CIR. OT eval pending.  CIR consulted  Body mass index is 23.64 kg/m.           DVT prophylaxis:  heparin injection 5,000 Units Start: 09/30/20 1900  Code Status: DNR/DNI Family Communication: Patient and/or RN. Available if any question.  Status is: Inpatient  Remains inpatient appropriate because:Unsafe d/c plan   Dispo: The patient is from: Home              Anticipated d/c is to: CIR              Anticipated d/c date is: 2 days              Patient currently is medically stable to d/c.       Consultants:  Surgery Infectious disease Nephrology Palliative medicine   Sch Meds:  Scheduled Meds: . amLODipine  10 mg Oral Daily  . atorvastatin  20 mg Oral QHS  . [START ON 10/04/2020] calcitRIOL  1.8 mcg Intravenous Once per day on Mon Wed Fri  . carvedilol  12.5 mg Oral Q breakfast  . carvedilol  12.5 mg Oral Once per day on Sun Tue Thu Sat  . Chlorhexidine Gluconate Cloth  6 each Topical Q0600  . [START ON 10/04/2020] darbepoetin (ARANESP) injection - DIALYSIS  150 mcg Intravenous Q Mon-HD  . docusate sodium  100 mg Oral BID  . heparin  5,000 Units Subcutaneous Q8H  . insulin aspart  0-6 Units Subcutaneous TID WC  . multivitamin  1 tablet Oral Q M,W,F-HD  . pregabalin  100 mg Oral QHS  . sevelamer carbonate  2,400 mg Oral TID WC  . sulfamethoxazole-trimethoprim  1 tablet Oral Q12H   Continuous Infusions: . sodium chloride    . sodium chloride     PRN Meds:.sodium chloride, sodium chloride, acetaminophen **OR** acetaminophen, albuterol, calcium carbonate (dosed in mg elemental calcium), camphor-menthol **AND** hydrOXYzine, feeding supplement (NEPRO CARB STEADY), heparin, hydrALAZINE, lidocaine (PF), lidocaine-prilocaine, morphine injection, ondansetron **OR** ondansetron (ZOFRAN) IV, oxyCODONE, pentafluoroprop-tetrafluoroeth, polyethylene glycol, senna-docusate, sorbitol, zolpidem  Antimicrobials: Anti-infectives (From admission,  onward)   Start     Dose/Rate Route Frequency Ordered Stop   10/01/20 2200  sulfamethoxazole-trimethoprim (BACTRIM) 400-80 MG per tablet 1 tablet        1 tablet Oral Every 12 hours 10/01/20 1524     10/01/20 1615  sulfamethoxazole-trimethoprim (BACTRIM DS) 800-160 MG per tablet 1 tablet        1 tablet Oral  Once 10/01/20 1524 10/01/20 1556   10/01/20 0815  ceFAZolin (ANCEF) IVPB 2g/100 mL premix  Status:  Discontinued        2 g 200 mL/hr over 30 Minutes Intravenous On call to O.R. 10/01/20 0725 10/01/20 1313       I have personally reviewed the following labs and images: CBC: Recent Labs  Lab 09/30/20 1747 09/30/20 1747 10/01/20 0354 10/01/20 1047 10/02/20 0228 10/03/20 0215 10/03/20 1001  WBC 10.6*  --  11.1*  --  9.6  --   --   NEUTROABS  --   --   --   --  6.8  --   --   HGB 8.1*   < > 8.3*  10.2* 8.8* 7.5* 7.8*  HCT 26.8*   < > 27.6* 30.0* 28.8* 24.9* 26.1*  MCV 75.5*  --  75.0*  --  76.0*  --   --   PLT 186  --  162  --  173  --   --    < > = values in this interval not displayed.   BMP &GFR Recent Labs  Lab 09/30/20 1747 10/01/20 0354 10/01/20 1047 10/02/20 0228 10/03/20 0215  NA 140 135 136 135 137  K 3.6 2.6* 3.1* 3.4* 3.6  CL 100 94* 94* 95* 96*  CO2 22 27  --  24 26  GLUCOSE 105* 93 114* 107* 102*  BUN 65* 23 27* 41* 52*  CREATININE 10.60* 5.75* 6.80* 7.76* 9.54*  CALCIUM 8.4* 8.1*  --  8.3* 8.2*  MG  --   --   --  1.8 2.0  PHOS 8.3*  --   --  7.7* 9.5*   Estimated Creatinine Clearance: 10 mL/min (A) (by C-G formula based on SCr of 9.54 mg/dL (H)). Liver & Pancreas: Recent Labs  Lab 09/30/20 1747 10/02/20 0228 10/03/20 0215  ALBUMIN 2.8* 2.7* 2.6*   No results for input(s): LIPASE, AMYLASE in the last 168 hours. No results for input(s): AMMONIA in the last 168 hours. Diabetic: No results for input(s): HGBA1C in the last 72 hours. Recent Labs  Lab 10/02/20 1156 10/02/20 1602 10/02/20 2059 10/03/20 0703 10/03/20 1204  GLUCAP 88 90 102*  89 96   Cardiac Enzymes: No results for input(s): CKTOTAL, CKMB, CKMBINDEX, TROPONINI in the last 168 hours. No results for input(s): PROBNP in the last 8760 hours. Coagulation Profile: Recent Labs  Lab 09/30/20 1747  INR 1.2   Thyroid Function Tests: No results for input(s): TSH, T4TOTAL, FREET4, T3FREE, THYROIDAB in the last 72 hours. Lipid Profile: No results for input(s): CHOL, HDL, LDLCALC, TRIG, CHOLHDL, LDLDIRECT in the last 72 hours. Anemia Panel: No results for input(s): VITAMINB12, FOLATE, FERRITIN, TIBC, IRON, RETICCTPCT in the last 72 hours. Urine analysis:    Component Value Date/Time   COLORURINE YELLOW 09/10/2018 1927   APPEARANCEUR HAZY (A) 09/10/2018 1927   LABSPEC 1.011 09/10/2018 1927   PHURINE 5.0 09/10/2018 1927   GLUCOSEU 50 (A) 09/10/2018 1927   HGBUR MODERATE (A) 09/10/2018 1927   BILIRUBINUR NEGATIVE 09/10/2018 1927   KETONESUR NEGATIVE 09/10/2018 1927   PROTEINUR 100 (A) 09/10/2018 1927   NITRITE NEGATIVE 09/10/2018 1927   LEUKOCYTESUR NEGATIVE 09/10/2018 1927   Sepsis Labs: Invalid input(s): PROCALCITONIN, [place]  Microbiology: Recent Results (from the past 240 hour(s))  Respiratory Panel by RT PCR (Flu A&B, Covid) - Nasopharyngeal Swab     Status: None   Collection Time: 09/30/20  3:37 PM   Specimen: Nasopharyngeal Swab  Result Value Ref Range Status   SARS Coronavirus 2 by RT PCR NEGATIVE NEGATIVE Final    Comment: (NOTE) SARS-CoV-2 target nucleic acids are NOT DETECTED.  The SARS-CoV-2 RNA is generally detectable in upper respiratoy specimens during the acute phase of infection. The lowest concentration of SARS-CoV-2 viral copies this assay can detect is 131 copies/mL. A negative result does not preclude SARS-Cov-2 infection and should not be used as the sole basis for treatment or other patient management decisions. A negative result may occur with  improper specimen collection/handling, submission of specimen other than  nasopharyngeal swab, presence of viral mutation(s) within the areas targeted by this assay, and inadequate number of viral copies (<131 copies/mL). A negative result must be combined with  clinical observations, patient history, and epidemiological information. The expected result is Negative.  Fact Sheet for Patients:  PinkCheek.be  Fact Sheet for Healthcare Providers:  GravelBags.it  This test is no t yet approved or cleared by the [place] FDA and  has been authorized for detection and/or diagnosis of SARS-CoV-2 by FDA under an Emergency Use Authorization (EUA). This EUA will remain  in effect (meaning this test can be used) for the duration of the COVID-19 declaration under Section 564(b)(1) of the Act, 21 U.S.C. section 360bbb-3(b)(1), unless the authorization is terminated or revoked sooner.     Influenza A by PCR NEGATIVE NEGATIVE Final   Influenza B by PCR NEGATIVE NEGATIVE Final    Comment: (NOTE) The Xpert Xpress SARS-CoV-2/FLU/RSV assay is intended as an aid in  the diagnosis of influenza from Nasopharyngeal swab specimens and  should not be used as a sole basis for treatment. Nasal washings and  aspirates are unacceptable for Xpert Xpress SARS-CoV-2/FLU/RSV  testing.  Fact Sheet for Patients: PinkCheek.be  Fact Sheet for Healthcare Providers: GravelBags.it  This test is not yet approved or cleared by the [place] FDA and  has been authorized for detection and/or diagnosis of SARS-CoV-2 by  FDA under an Emergency Use Authorization (EUA). This EUA will remain  in effect (meaning this test can be used) for the duration of the  Covid-19 declaration under Section 564(b)(1) of the Act, 21  U.S.C. section 360bbb-3(b)(1), unless the authorization is  terminated or revoked. Performed at [facility], [place] [address], [place],  [place] [address]   Surgical pcr screen     Status: None   Collection Time: 09/30/20  3:47 PM   Specimen: Nasal Mucosa; Nasal Swab  Result Value Ref Range Status   MRSA, PCR NEGATIVE NEGATIVE Final   Staphylococcus aureus NEGATIVE NEGATIVE Final    Comment: (NOTE) The Xpert SA Assay (FDA approved for NASAL specimens in patients 27 years of age and older), is one component of a comprehensive surveillance program. It is not intended to diagnose infection nor to guide or monitor treatment. Performed at [facility], [place] [address], [place], [place] [address]   Aerobic/Anaerobic Culture (surgical/deep wound)     Status: None (Preliminary result)   Collection Time: 10/01/20 11:40 AM   Specimen: Soft Tissue, Other  Result Value Ref Range Status   Specimen Description WOUND  Final   Special Requests LEFT HAND SPEC A  Final   Gram Stain   Final    RARE WBC PRESENT, PREDOMINANTLY MONONUCLEAR NO ORGANISMS SEEN    Culture   Final    FEW PROTEUS PENNERI HOLDING FOR POSSIBLE ANAEROBE Performed at [facility], [place] [address] [place], [place] [address]    Report Status PENDING  Incomplete   Organism ID, Bacteria PROTEUS PENNERI  Final      Susceptibility   Proteus penneri - MIC*    AMPICILLIN >=32 RESISTANT Resistant     CEFAZOLIN >=64 RESISTANT Resistant     CEFEPIME <=0.12 SENSITIVE Sensitive     CEFTAZIDIME <=1 SENSITIVE Sensitive     CEFTRIAXONE <=0.25 SENSITIVE Sensitive     CIPROFLOXACIN <=0.25 SENSITIVE Sensitive     GENTAMICIN <=1 SENSITIVE Sensitive     IMIPENEM 4 SENSITIVE Sensitive     TRIMETH/SULFA <=20 SENSITIVE Sensitive     AMPICILLIN/SULBACTAM 16 INTERMEDIATE Intermediate     PIP/TAZO <=4 SENSITIVE Sensitive     * FEW PROTEUS PENNERI    Radiology Studies: No  results found.    Tanajah Boulter T. [place]  If 7PM-7AM, please contact night-coverage [email or web address] 10/03/2020, 1:49 PM

## 2020-10-03 NOTE — TOC Progression Note (Signed)
Transition of Care [facility]) - Progression Note    Patient Details  Name: [name] MRN: [id number] Date of Birth: [birth year]  Transition of Care [facility]) CM/SW [place], [place] Phone Number: 10/03/2020, 4:16 PM  Clinical Narrative:    CSW met with pt at bedside and introduce self and role.  CSW received a consult for housing concerns for pt and spouse.  CSW gave pt a list for housing resources.  CSW suggested that pt's spouse contact [facility] and [facility] tomorrow morning.  Pt stated that their water bill is running $500.00 a month.  Pt's landlord is aware of the water leak in the home and has not fixed the concerns of the pt.  Pt has lived at this resident for 6 years.  TOC Team will continue to assist for disposition planning.    Expected Discharge Plan: [place] Barriers to Discharge: Continued Medical Work up  Expected Discharge Plan and Services Expected Discharge Plan: [place]   Discharge Planning Services: CM Consult Post Acute Care Choice: Durable Medical Equipment Living arrangements for the past 2 months: Single Family Home Expected Discharge Date: 10/03/20                         HH Arranged: RN, PT, OT, Nurse's Aide, Social Work   Date Intercourse: 10/02/20 Time [place]: [phone] Representative spoke with at [place]: [place] ([place]) Interventions    Readmission Risk Interventions Readmission Risk Prevention Plan 03/16/2020  Transportation Screening Complete  PCP or Specialist Appt within 3-5 Days (No Data)  [place] or [place] (No Data)  Medication Review Press photographer) Complete  Some recent data might be hidden

## 2020-10-03 NOTE — Progress Notes (Signed)
[facility] ([facility])  Hospital Liaison RN note         Notified by TOC manager of patient/family request for [facility] Palliative services at home after discharge.       [facility] Palliative team will follow up with patient after discharge.         Please call with any hospice or palliative related questions.         Thank you for the opportunity to participate in this patient's care.     Chrislyn [name], BSN, RN [facility] Hospital Liaison (listed on AMION under Hospice/[facility])    [phone]     

## 2020-10-03 NOTE — Progress Notes (Signed)
Palliative Medicine Inpatient Follow Up Note  Reason for consult: Goals of Care   HPI:  Per intake H&P --> [name] a 59 y.o.malewith medical history significant ofPVD s/p B BKA; DM; HTN; and ESRD on MWF HD presenting forrecurrent L hand ischemia/infection. Most recently, he had amputation of the L index finger at the MP joint by Dr. Fredna Dow on 10/4. Dr. Megan Salon saw him in clinic on 10/7 and felt that the infection was cured and he no longer needed antibiotics. He was referred for outpatient palliative care consult and appears to have been unavailable for scheduling. We had a long discussion today regarding the poor prognosis associated with diffuse distal limb ischemia. He reports a lack of understanding regarding the severity of his disease and the pending palliative care consult. He voices understanding but reports that his wife will struggle more with understanding the anticipated prognosis. He has been decreasingly mobile since he has developed LE ulcerations on his B stumps and R knee. His initial R interphalanx amputation was on 4/25 with R index finger amputation on 4/9, R middle and 4th finger amputations on 5/22, and L forefinger amputation on 10/4.   Palliative care was asked to address goals of carewith [name].  Today's Discussion (10/03/2020): Chart reviewed. I met with [name] at bedside, we called his wife, [name] on speaker phone. I shared with [name] why I am involved in [name]'s care and explained Palliative care. We talked about how he still has joy in his life.  [name] has back problems therefore caring for [name] is difficult she requests if possible to have medicaid send a Psychologist, counselling. I was able to speak to the [facility] team about this. She also expresses that [name] needs an electric wheelchair which had been ordered quite sometime ago by his primary doctor though he has not received this. She then goes on to express concerns about her present  living situation. I have informed both case management and social work of these issues to aid in support.   Offered emotional support through listening as Issaac and his wife have had a very hard time over the past two years.   We talked about advance care planning and the importance of this moving forward. [name] and [name] are both amenable to OP Palliative support.  Discussed the importance of continued conversation with family and their  medical providers regarding overall plan of care and treatment options, ensuring decisions are within the context of the patients values and GOCs.  Questions and concerns addressed   Objective Assessment: Vital Signs Vitals:   10/03/20 0820 10/03/20 1100  BP: (!) 141/78 (!) 145/75  Pulse: 70 63  Resp: 17 17  Temp: 98.2 F (36.8 C) 98.6 F (37 C)  SpO2: 96%    No intake or output data in the 24 hours ending 10/03/20 1308 Last Weight  Most recent update: 10/01/2020 10:50 AM   Weight  88.1 kg (194 lb 3.6 oz)           Gen:  NAD HEENT: moist mucous membranes CV: Regular rate and rhythm, no murmurs rubs or gallops PULM: clear to auscultation bilaterally  ABD: soft/nontender  EXT: BLE BKA's, right hand three digit amputation dressing in place, L hand dressing in place Neuro: Alert and oriented x4  SUMMARY OF RECOMMENDATIONS DNAR/DNI, continue hemodialysis  Appreciate social work investigating Medicaid and if patient may qualify for additional in-home supportive services  Appreciate incisions of care team identifying if patient would qualify for  an Psychologist, occupational  We will place a consult for outpatient palliative enrollment  Advance directives need to be completed  Time Spent: 35 Greater than 50% of the time was spent in counseling and coordination of care ______________________________________________________________________________________ [place] Team Team Cell Phone:  [phone] Please utilize secure chat with additional questions, if there is no response within 30 minutes please call the above phone number  Palliative Medicine Team providers are available by phone from 7am to 7pm daily and can be reached through the team cell phone.  Should this patient require assistance outside of these hours, please call the patient's attending physician.

## 2020-10-03 NOTE — Progress Notes (Signed)
Received referral to help [name] with a motorized W/C, home support worker through [place], and finding a place to live. [name] Ruiz, NP, is asking me to reach out to [name]'s wife [name]) about these issues.   Met with [name]. He was on the phone with his wife. Explained to them that Levada Dy, [facility] arranged [facility] services with [facility] and they are also going to help him with PCS through Medicaid. [name] doesn't know who is his SW through [place]. Encouraged them to contact Medicaid, the phone # is on the back of his Medicaid card and to find out who is his SW and phone # and the SW can help with PCS.  Discussed the motorized W/C. Explained to them that his PCP needs to order the motorized W/C. Wife reports that his doctor ordered it, but they haven't heard anything about it. Encouraged wife to contact his PCP to discuss the order. She verbalized understanding.  Wife is asking for assistance finding a place to live. She reports that their landlord is asking them to move out in 30 days and they don't know where to go. Informed them that I'll ask the SW to assist them and she verbalized understanding. Notified Baxter Flattery, SW about [name] needing help finding a place to live.  Received referral to assist with a bilateral platform RW. Notified nurse what we need an order for the DME. Contacted Karmen Bongo with [facility] for DME referral. Received message from RN, [name] may go to inpt rehab and he is not going home today. Notified Karmen Bongo and DME order cancelled.  Will continue to f/u to assist with the D/C plan.

## 2020-10-03 NOTE — Progress Notes (Signed)
[facility] Progress Note   59 y.o.malewith ESRD on HD MWF, PDD, s/p b/l  BKA, multiple finger amputations/infections, DMT2. Most recent amputation of left index finger 10/4  Followed by  ID and hand specialist. s/p debridement of left index finger wound.   Dialysis Orders: GO MWF 4h 67min 200NRe 425/800 EDW 84kg 2K/2Ca L AVF No heparin  Venofer 100mg  IV x 10 (until 11/15) Mircera 150 q 2 weeks (last 10/15)  Calcitriol 1.75 TIW  Assessment/ Plan:   1. PVD/L hand ischemia/infection. S/p recent amp L index finger. Plans for OR washout today 2. ESRD -HD MWF. Dialyzed off schedule finishing very early Fri AM  in anticipation of surgery. Post HD Hypokalemia noted. Got KCl x1. Trend labs.  - Only 2 treatments last  week but he refused HD Saturday  and no absolute indication.   - Planning  on HD Monday if he's still here; no absolute indication for RRT and he is comfortable.  3. Hypertension/volume - Blood pressures better today. Have been running high as outpatient as well. EDW just lowered. On Coreg. May be aggrivated by pain as well. Challenge UF as tolerated. 4. Anemia - Hgb 8.3 Due for ESA redose. Will give here if remains in hospital -> aranesp 150 x1 tomorrow w/ HD. 5. Metabolic bone disease -Ca ok. Continue home binders  6. DMT2 -per primary  Subjective:   Pain in fingers when he moves and he woke up with pain, but at this time better controlled.  Denies f/c/n/v/ dyspnea   Objective:   BP 130/70 (BP Location: Right Arm)   Pulse 70   Temp 98.5 F (36.9 C) (Oral)   Resp 18   Ht 6\' 4"  (1.93 m)   Wt 88.1 kg   SpO2 100%   BMI 23.64 kg/m   Intake/Output Summary (Last 24 hours) at 10/03/2020 0811 Last data filed at 10/02/2020 1300 Gross per 24 hour  Intake 240 ml  Output --  Net 240 ml   Weight change:   Physical Exam: General:Sitting up in bed, nad Head:NCAT sclera not icteric MMM Neck: Supple. No JVDappreciated Lungs: CTA  bilaterally, Breathing is unlabored. Heart:RRR with S1 S2 Abdomen: softnon-tender Lower extremities:bilateral BKA. No stump edema Neuro: A &O X 3. Moves all extremities spontaneously. Psych: Responds to questions appropriately with a normal affect. Dialysis Access:LUE BCF +bruit  Imaging: No results found.  Labs: BMET Recent Labs  Lab 09/30/20 1747 10/01/20 0354 10/01/20 1047 10/02/20 0228 10/03/20 0215  NA 140 135 136 135 137  K 3.6 2.6* 3.1* 3.4* 3.6  CL 100 94* 94* 95* 96*  CO2 22 27  --  24 26  GLUCOSE 105* 93 114* 107* 102*  BUN 65* 23 27* 41* 52*  CREATININE 10.60* 5.75* 6.80* 7.76* 9.54*  CALCIUM 8.4* 8.1*  --  8.3* 8.2*  PHOS 8.3*  --   --  7.7* 9.5*   CBC Recent Labs  Lab 09/30/20 1747 09/30/20 1747 10/01/20 0354 10/01/20 1047 10/02/20 0228 10/03/20 0215  WBC 10.6*  --  11.1*  --  9.6  --   NEUTROABS  --   --   --   --  6.8  --   HGB 8.1*   < > 8.3* 10.2* 8.8* 7.5*  HCT 26.8*   < > 27.6* 30.0* 28.8* 24.9*  MCV 75.5*  --  75.0*  --  76.0*  --   PLT 186  --  162  --  173  --    < > =  values in this interval not displayed.    Medications:    . amLODipine  10 mg Oral Daily  . atorvastatin  20 mg Oral QHS  . carvedilol  12.5 mg Oral Q breakfast  . carvedilol  12.5 mg Oral Once per day on Sun Tue Thu Sat  . Chlorhexidine Gluconate Cloth  6 each Topical Q0600  . docusate sodium  100 mg Oral BID  . heparin  5,000 Units Subcutaneous Q8H  . insulin aspart  0-6 Units Subcutaneous TID WC  . multivitamin  1 tablet Oral Q M,W,F-HD  . pregabalin  100 mg Oral QHS  . sevelamer carbonate  2,400 mg Oral TID WC  . sulfamethoxazole-trimethoprim  1 tablet Oral Q12H      Otelia Santee, MD 10/03/2020, 8:11 AM

## 2020-10-03 NOTE — Evaluation (Signed)
Occupational Therapy Evaluation Patient Details Name: [name] MRN: [id number] DOB: [birth year] Today's Date: 10/03/2020    History of Present Illness 59 y.o. male with medical history significant of PVD s/p B BKA; DM; HTN; and ESRD on MWF HD presenting for recurrent L hand ischemia/infection. His initial R interphalanx amputation was on 4/25 with R index finger amputation on 4/9, R middle and 4th finger amputations on 5/22, and L forefinger amputation on 10/4. 10/29 to OR: Rt hand I&D dorsal aspect, Lt hand I&D over index MCP joint.   Clinical Impression   Pt admitted with above. He demonstrates the below listed deficits and will benefit from continued OT to maximize safety and independence with BADLs.  Pt requires set up to max A for ADLs due to finger amputations and wounds bil. Hand complicated by h/o bil BKA with wound on Rt knee.  He has been performing ADLs mod I, but this may have contributed to healing issues with hands.  He lives with his wife who is supportive.   He would greatly benefit from CIR and continued OT services for adaptive equipment training, adaptations to allow him to complete ADLs in a safer manner, as well as investigation of appropriate DME such as power w/c, etc to allow him to maximize his independence.  Pt is exceptionally motivated and feel he will make excellent progress.  Will follow acutely       Follow Up Recommendations  CIR;Supervision - Intermittent    Equipment Recommendations  Wheelchair (measurements OT) (power w/c )    Recommendations for Other Services Rehab consult     Precautions / Restrictions Precautions Precautions: Fall Precaution Comments: denies h/o falls Restrictions Weight Bearing Restrictions: No Other Position/Activity Restrictions: MD did not specify, however encouraged pt to minimize WB'g through hands      Mobility Bed Mobility Overal bed mobility: Modified Independent             General bed mobility  comments: HOB elevated    Transfers Overall transfer level: Needs assistance Equipment used: Bilateral platform walker Transfers: Sit to/from Stand;Stand Pivot Transfers Sit to Stand: Min assist Stand pivot transfers: Min assist            Balance                                           ADL either performed or assessed with clinical judgement   ADL Overall ADL's : Needs assistance/impaired Eating/Feeding: Modified independent   Grooming: Wash/dry hands;Wash/dry face;Oral care;Brushing hair;Set up;Sitting;Bed level   Upper Body Bathing: Sitting;Maximal assistance Upper Body Bathing Details (indicate cue type and reason): due to need to keep hands dry  Lower Body Bathing: Bed level;Maximal assistance Lower Body Bathing Details (indicate cue type and reason): due to need to keep hands dry  Upper Body Dressing : Moderate assistance;Sitting   Lower Body Dressing: Moderate assistance;Sit to/from stand   Toilet Transfer: Minimal assistance;Ambulation;RW (bil. platform RW )   Toileting- Clothing Manipulation and Hygiene: Moderate assistance;Sit to/from stand       Functional mobility during ADLs: Minimal assistance;Rolling walker       Vision Patient Visual Report: No change from baseline       Perception     Praxis      Pertinent Vitals/Pain Pain Assessment: 0-10 Pain Score: 7  Pain Location: bil hands Pain Descriptors / Indicators: Constant;Guarding;Operative site guarding Pain  Intervention(s): Monitored during session     Hand Dominance Right   Extremity/Trunk Assessment Upper Extremity Assessment Upper Extremity Assessment: RUE deficits/detail;LUE deficits/detail RUE Deficits / Details: Pt with bulky dressing in place.  Thumb and ring finger both with discoloration at the tips.  he reports he is Rt hand dominant and uses Rt UE to retrieve items - discussed using Lt UE instead  RUE Coordination: decreased fine motor LUE Deficits /  Details: pt s/p index finger amputation.  Bulky dressing in place  LUE Coordination: decreased fine motor   Lower Extremity Assessment Lower Extremity Assessment: Defer to PT evaluation   Cervical / Trunk Assessment Cervical / Trunk Assessment: Normal   Communication Communication Communication: No difficulties   Cognition Arousal/Alertness: Awake/alert Behavior During Therapy: WFL for tasks assessed/performed Overall Cognitive Status: Within Functional Limits for tasks assessed                                     General Comments  Wife present at end of session and discussed options for power wheelchair and possible referrals     Exercises     Shoulder Instructions      Home Living Family/patient expects to be discharged to:: Private residence Living Arrangements: Spouse/significant other Available Help at Discharge: Family;Available PRN/intermittently Type of Home: House Home Access: Stairs to enter [employer] of Steps: 1 Entrance Stairs-Rails: None Home Layout: One level     Bathroom Shower/Tub: Teacher, early years/pre: Standard Bathroom Accessibility: No   Home Equipment: Environmental consultant - 2 wheels;Walker - 4 wheels;Cane - quad;Crutches;Bedside commode;Wheelchair - manual   Additional Comments: reports wheelchair in disrepair and ?usable (reports Medicare did not buy w/c or any of his walkers)      Prior Functioning/Environment Level of Independence: Independent with assistive device(s)        Comments: Has been walking with bil prosthesis and rollator (inside and outside). Takes transportation bus to HD (it is wheelchair accessible). Pt reports his landlord recently passed and their family are reclaiming the home. They are trying to find somewhere to live. They have 2 dogs.         OT Problem List: Decreased strength;Decreased activity tolerance;Impaired balance (sitting and/or standing);Decreased coordination;Decreased safety  awareness;Decreased knowledge of use of DME or AE;Impaired UE functional use;Pain      OT Treatment/Interventions:      OT Goals(Current goals can be found in the care plan section) Acute Rehab OT Goals Patient Stated Goal: To continue to help people and be independent  OT Goal Formulation: With patient/family Time For Goal Achievement: 10/17/20 Potential to Achieve Goals: Good  OT Frequency: Min 2X/week   Barriers to D/C:            Co-evaluation              AM-PAC OT "6 Clicks" Daily Activity     Outcome Measure Help from another person eating meals?: A Little Help from another person taking care of personal grooming?: A Little Help from another person toileting, which includes using toliet, bedpan, or urinal?: A Lot Help from another person bathing (including washing, rinsing, drying)?: A Lot Help from another person to put on and taking off regular upper body clothing?: A Little Help from another person to put on and taking off regular lower body clothing?: A Lot 6 Click Score: 15   End of Session    Activity Tolerance: Patient  tolerated treatment well Patient left: in bed;with call bell/phone within reach;with family/visitor present  OT Visit Diagnosis: Unsteadiness on feet (R26.81);Pain Pain - Right/Left: Right Pain - part of body: Hand                Time: 4932-4199 OT Time Calculation (min): 37 min Charges:  OT General Charges $OT Visit: 1 Visit OT Evaluation $OT Eval Moderate Complexity: 1 Mod OT Treatments $Therapeutic Activity: 8-22 mins  Nilsa Nutting., OTR/L Acute Rehabilitation Services Pager [phone] Office [place], [place] 10/03/2020, 5:35 PM

## 2020-10-03 NOTE — Progress Notes (Signed)
      INFECTIOUS DISEASE ATTENDING ADDENDUM:   Date: 10/03/2020  Patient name: [name]  Medical record number: [id number]  Date of birth: May 15, 1961   Patient growing Proteus S to bactrim cultures holding for anerobe   Rhina [place] 10/03/2020, 4:50 PM

## 2020-10-03 NOTE — Progress Notes (Cosign Needed)
MSW intern met with patient to discuss housing situation. Patient called spouse to be involved in conversation. Patient / spouse on limited income, $1200 monthly; been paying $500 for monthly water bills due to plumbing issues that landlord would not repair. MSW Intern advised patient that a list will be compiled and provided for them with resources for Income based housing, section 8 / housing authority and [employer] 211 services.   [place] [phone] Affordable Housing Management [phone]

## 2020-10-03 NOTE — Plan of Care (Signed)

## 2020-10-03 NOTE — Progress Notes (Signed)
Page by Shelly Bombard, Mable Fill about patient's request to have code status changed to full code. Called patient on room phone and confirmed. Code status changed to full code.

## 2020-10-04 LAB — CBC
HCT: 25.3 % — ABNORMAL LOW (ref 39.0–52.0)
Hemoglobin: 7.7 g/dL — ABNORMAL LOW (ref 13.0–17.0)
MCH: 22.9 pg — ABNORMAL LOW (ref 26.0–34.0)
MCHC: 30.4 g/dL (ref 30.0–36.0)
MCV: 75.3 fL — ABNORMAL LOW (ref 80.0–100.0)
Platelets: 193 10*3/uL (ref 150–400)
RBC: 3.36 MIL/uL — ABNORMAL LOW (ref 4.22–5.81)
RDW: 18.2 % — ABNORMAL HIGH (ref 11.5–15.5)
WBC: 9.2 10*3/uL (ref 4.0–10.5)
nRBC: 0 % (ref 0.0–0.2)

## 2020-10-04 LAB — RENAL FUNCTION PANEL
Albumin: 2.5 g/dL — ABNORMAL LOW (ref 3.5–5.0)
Anion gap: 19 — ABNORMAL HIGH (ref 5–15)
BUN: 70 mg/dL — ABNORMAL HIGH (ref 8–23)
CO2: 20 mmol/L — ABNORMAL LOW (ref 22–32)
Calcium: 8 mg/dL — ABNORMAL LOW (ref 8.9–10.3)
Chloride: 96 mmol/L — ABNORMAL LOW (ref 98–111)
Creatinine, Ser: 11.11 mg/dL — ABNORMAL HIGH (ref 0.61–1.24)
GFR, Estimated: 5 mL/min — ABNORMAL LOW (ref >60)
Glucose, Bld: 118 mg/dL — ABNORMAL HIGH (ref 70–99)
Phosphorus: 10 mg/dL — ABNORMAL HIGH (ref 2.5–4.6)
Potassium: 3.9 mmol/L (ref 3.5–5.1)
Sodium: 135 mmol/L (ref 135–145)

## 2020-10-04 LAB — GLUCOSE, CAPILLARY: Glucose-Capillary: 108 mg/dL — ABNORMAL HIGH (ref 70–99)

## 2020-10-04 MED ORDER — DARBEPOETIN ALFA 150 MCG/0.3ML IJ SOSY
PREFILLED_SYRINGE | INTRAMUSCULAR | Status: AC
  Administered 2020-10-04: 150 ug via INTRAVENOUS
  Filled 2020-10-04: qty 0.3

## 2020-10-04 MED ORDER — CHLORHEXIDINE GLUCONATE CLOTH 2 % EX PADS
6.0000 | MEDICATED_PAD | Freq: Every day | CUTANEOUS | Status: DC
  Administered 2020-10-04: 6 via TOPICAL

## 2020-10-04 MED ORDER — OXYCODONE HCL 5 MG PO TABS
ORAL_TABLET | ORAL | Status: AC
  Administered 2020-10-04: 15 mg via ORAL
  Filled 2020-10-04: qty 3

## 2020-10-04 MED ORDER — SEVELAMER CARBONATE 800 MG PO TABS
3200.0000 mg | ORAL_TABLET | Freq: Three times a day (TID) | ORAL | Status: DC
  Administered 2020-10-04 – 2020-10-05 (×4): 3200 mg via ORAL
  Filled 2020-10-04 (×6): qty 4

## 2020-10-04 MED ORDER — SULFAMETHOXAZOLE-TRIMETHOPRIM 800-160 MG PO TABS
1.0000 | ORAL_TABLET | Freq: Every day | ORAL | Status: DC
  Administered 2020-10-04 – 2020-10-05 (×2): 1 via ORAL
  Filled 2020-10-04 (×2): qty 1

## 2020-10-04 MED ORDER — CALCITRIOL 1 MCG/ML IV SOLN
INTRAVENOUS | Status: AC
  Administered 2020-10-04: 1.8 ug via INTRAVENOUS
  Filled 2020-10-04: qty 2

## 2020-10-04 MED ORDER — OXYCODONE HCL 5 MG PO TABS
15.0000 mg | ORAL_TABLET | ORAL | Status: DC | PRN
  Administered 2020-10-04 – 2020-10-06 (×11): 15 mg via ORAL
  Filled 2020-10-04 (×11): qty 3

## 2020-10-04 NOTE — Consult Note (Signed)
WOC Nurse Consult Note: Patient receiving care in [facility] 5N[facility]. Reason for Consult: wounds to BKA and hand Wound type: Dried, crusted areas on BLEs caused by rubbing from his prostheses.  Surgical incisions to bilateral hands. Pressure Injury POA: Yes/No/NA Measurement: Right knee has a 2.5 cm x 3.2 cm dried, crusted area.  Below this is a dried, crusted area that measures 0.7 cm x 0.7 cm.  The left knee has a dry, crusted area that measures 2 cm x 2 cm Wound bed: Drainage (amount, consistency, odor) none Periwound: intact. Dressing procedure/placement/frequency:  Per Dr. Bertis Ruddy note from 10/01/20:  "Patient underwent bilateral hand incision and drainage with primary wound closure over a drain on the left side and sterile dressing on right hand.  Patient will need to start daily wet-to-dry dressing changes on the right hand starting on Monday.  Patient's left hand wound can be changed at his first postop visit in my office next week.  Intraoperative cultures are pending.  Please contact me over the weekend if there are any significant changes in the patient's condition."  I have ordered saline moistened gauze to the right hand twice daily.  For the BLE crusted areas:  Apply iodine from the swab sticks or swab pads in clean utility, to the dried, crusted areas on BLE. Allow to air dry.  Monitor the wound area(s) for worsening of condition such as: Signs/symptoms of infection,  Increase in size,  Development of or worsening of odor, Development of pain, or increased pain at the affected locations.  Notify the medical team if any of these develop.  Thank you for the consult.  Discussed plan of care with the patient and bedside nurse.  [facility] nurse will not follow at this time.  Please re-consult the [facility] team if needed.  Val Riles, RN, MSN, CWOCN, CNS-BC, pager [phone]

## 2020-10-04 NOTE — Plan of Care (Signed)
  Problem: Education: Goal: Knowledge of General Education information will improve Description: Including pain rating scale, medication(s)/side effects and non-pharmacologic comfort measures Outcome: Progressing   Problem: Health Behavior/Discharge Planning: Goal: Ability to manage health-related needs will improve Outcome: Progressing   Problem: Clinical Measurements: Goal: Ability to maintain clinical measurements within normal limits will improve Outcome: Progressing   Problem: Activity: Goal: Risk for activity intolerance will decrease Outcome: Progressing   Problem: Coping: Goal: Level of anxiety will decrease Outcome: Progressing   Problem: Elimination: Goal: Will not experience complications related to bowel motility Outcome: Progressing   Problem: Pain Managment: Goal: General experience of comfort will improve Outcome: Progressing   Problem: Safety: Goal: Ability to remain free from injury will improve Outcome: Progressing   

## 2020-10-04 NOTE — Progress Notes (Signed)
Inpatient Rehabilitation Admissions Coordinator  Inpatient rehab consult received. I met with patient at bedside to discuss goals and expectations of possible Cir admit. He has previously been with [place] in 2018 and 2019. I do not have a bed today, but feel patient could benefit from a CIR admit. He does not want SNF, prefers CIR admit. I await bed availability for CIR admit.  Danne Baxter, RN, MSN Rehab Admissions Coordinator [phone] 10/04/2020 1:17 PM

## 2020-10-04 NOTE — Progress Notes (Signed)
[name]  Date of Admission:  09/30/2020      Total days of antibiotics 4  Bactrim    ASSESSMENT: [name] is a 59 y.o. dialysis dependent male here with hand infections that required debridement. Intraoperative cultures growing out proteus species that is sensitive to bactrim. Will increase the dose to a double strength tablet every evening after HD. Spent careful time discussing timing of antibiotics with him today.  Will plan for 2 week course to treat soft tissue involvement and follow up with Dr. Megan Salon as scheduled.   Will sign off but happy to see him back for any questions or changes in condition.    PLAN: 1. Continue 1 DS Bactrim QHS - would give him 2 week course  2. Follow up with Dr. Megan Salon arranged for 11/23 to evaluate further.  3. Planning CIR admission    Principal Problem:   Ischemia of digits of hand Active Problems:   S/P bilateral BKA (below knee amputation) (HCC)   Malignant hypertension   End stage renal disease on dialysis (HCC)   Type 2 diabetes mellitus (HCC)   HLD (hyperlipidemia)   Goals of care, counseling/discussion   DNR (do not resuscitate)   Palliative care by specialist    amLODipine  10 mg Oral Daily   atorvastatin  20 mg Oral QHS   calcitRIOL  1.8 mcg Intravenous Once per day on Mon Wed Fri   carvedilol  12.5 mg Oral Q breakfast   carvedilol  12.5 mg Oral Once per day on Sun Tue Thu Sat   Chlorhexidine Gluconate Cloth  6 each Topical Q0600   Chlorhexidine Gluconate Cloth  6 each Topical Q0600   darbepoetin (ARANESP) injection - DIALYSIS  150 mcg Intravenous Q Mon-HD   docusate sodium  100 mg Oral BID   heparin  5,000 Units Subcutaneous Q8H   multivitamin  1 tablet Oral Q M,W,F-HD   pregabalin  100 mg Oral QHS   sevelamer carbonate  3,200 mg Oral TID WC   sulfamethoxazole-trimethoprim  1 tablet Oral Daily    SUBJECTIVE: Doing well today. Throbbing pains in hands  still. Says he is planning on rehab stay here before he goes home.    Review of Systems: Review of Systems  Constitutional: Negative for chills and fever.  Eyes: Negative for blurred vision and photophobia.  Respiratory: Negative for cough and sputum production.   Cardiovascular: Negative for chest pain.  Gastrointestinal: Negative for diarrhea, nausea and vomiting.  Genitourinary:       ESRD, anuric   Musculoskeletal: Positive for joint pain (hand pains).  Skin: Negative for itching and rash.  Neurological: Negative for headaches.    No Known Allergies  OBJECTIVE: Vitals:   10/03/20 1633 10/03/20 1936 10/04/20 0620 10/04/20 0803  BP: (!) 157/80 134/74 (!) 174/86 (!) 178/96  Pulse: 70 70 68 69  Resp: 17 16 17 17   Temp: 98.7 F (37.1 C) 98.6 F (37 C) 98.3 F (36.8 C) 98.5 F (36.9 C)  TempSrc:  Oral Oral Oral  SpO2: 100% 100% 100% 98%  Weight:      Height:       Body mass index is 23.64 kg/m.  Physical Exam Constitutional:      Appearance: He is well-developed.     Comments: Resting quietly in bed  HENT:     Mouth/Throat:     Dentition: Normal dentition. No dental abscesses.  Cardiovascular:  Rate and Rhythm: Normal rate and regular rhythm.     Heart sounds: Normal heart sounds.  Pulmonary:     Effort: Pulmonary effort is normal.     Breath sounds: Normal breath sounds.  Abdominal:     General: There is no distension.     Palpations: Abdomen is soft.     Tenderness: There is no abdominal tenderness.  Musculoskeletal:     Comments: Hands wrapped in clean dry dressing material bilaterally   Lymphadenopathy:     Cervical: No cervical adenopathy.  Skin:    General: Skin is warm and dry.     Findings: No rash.  Neurological:     Mental Status: He is alert and oriented to person, place, and time.  Psychiatric:        Judgment: Judgment normal.      Lab Results Lab Results  Component Value Date   WBC 9.6 10/02/2020   HGB 7.8 (L) 10/03/2020   HCT  26.1 (L) 10/03/2020   MCV 76.0 (L) 10/02/2020   PLT 173 10/02/2020    Lab Results  Component Value Date   CREATININE 9.54 (H) 10/03/2020   BUN 52 (H) 10/03/2020   NA 137 10/03/2020   K 3.6 10/03/2020   CL 96 (L) 10/03/2020   CO2 26 10/03/2020    Lab Results  Component Value Date   ALT 15 07/21/2020   AST 27 07/21/2020   ALKPHOS 75 07/21/2020   BILITOT 0.5 07/21/2020     Microbiology: Recent Results (from the past 240 hour(s))  Respiratory Panel by RT PCR (Flu A&B, Covid) - Nasopharyngeal Swab     Status: None   Collection Time: 09/30/20  3:37 PM   Specimen: Nasopharyngeal Swab  Result Value Ref Range Status   SARS Coronavirus 2 by RT PCR NEGATIVE NEGATIVE Final    Comment: (NOTE) SARS-CoV-2 target nucleic acids are NOT DETECTED.  The SARS-CoV-2 RNA is generally detectable in upper respiratoy specimens during the acute phase of infection. The lowest concentration of SARS-CoV-2 viral copies this assay can detect is 131 copies/mL. A negative result does not preclude SARS-Cov-2 infection and should not be used as the sole basis for treatment or other patient management decisions. A negative result may occur with  improper specimen collection/handling, submission of specimen other than nasopharyngeal swab, presence of viral mutation(s) within the areas targeted by this assay, and inadequate number of viral copies (<131 copies/mL). A negative result must be combined with clinical observations, patient history, and epidemiological information. The expected result is Negative.  Fact Sheet for Patients:  PinkCheek.be  Fact Sheet for Healthcare Providers:  GravelBags.it  This test is no t yet approved or cleared by the [place] FDA and  has been authorized for detection and/or diagnosis of SARS-CoV-2 by FDA under an Emergency Use Authorization (EUA). This EUA will remain  in effect (meaning this test can be used)  for the duration of the COVID-19 declaration under Section 564(b)(1) of the Act, 21 U.S.C. section 360bbb-3(b)(1), unless the authorization is terminated or revoked sooner.     Influenza A by PCR NEGATIVE NEGATIVE Final   Influenza B by PCR NEGATIVE NEGATIVE Final    Comment: (NOTE) The Xpert Xpress SARS-CoV-2/FLU/RSV assay is intended as an aid in  the diagnosis of influenza from Nasopharyngeal swab specimens and  should not be used as a sole basis for treatment. Nasal washings and  aspirates are unacceptable for Xpert Xpress SARS-CoV-2/FLU/RSV  testing.  Fact Sheet for Patients: PinkCheek.be  Fact Sheet for Healthcare Providers: GravelBags.it  This test is not yet approved or cleared by the [place] FDA and  has been authorized for detection and/or diagnosis of SARS-CoV-2 by  FDA under an Emergency Use Authorization (EUA). This EUA will remain  in effect (meaning this test can be used) for the duration of the  Covid-19 declaration under Section 564(b)(1) of the Act, 21  U.S.C. section 360bbb-3(b)(1), unless the authorization is  terminated or revoked. Performed at [facility], [place] [address], [place], [place] [address]   Surgical pcr screen     Status: None   Collection Time: 09/30/20  3:47 PM   Specimen: Nasal Mucosa; Nasal Swab  Result Value Ref Range Status   MRSA, PCR NEGATIVE NEGATIVE Final   Staphylococcus aureus NEGATIVE NEGATIVE Final    Comment: (NOTE) The Xpert SA Assay (FDA approved for NASAL specimens in patients 61 years of age and older), is one component of a comprehensive surveillance program. It is not intended to diagnose infection nor to guide or monitor treatment. Performed at [facility], [place] [address] [name], [place] [address]   Aerobic/Anaerobic Culture (surgical/deep wound)     Status: None (Preliminary result)   Collection Time: 10/01/20 11:40 AM   Specimen: Soft  Tissue, Other  Result Value Ref Range Status   Specimen Description WOUND  Final   Special Requests LEFT HAND SPEC A  Final   Gram Stain   Final    RARE WBC PRESENT, PREDOMINANTLY MONONUCLEAR NO ORGANISMS SEEN Performed at [facility], [place] [address] [place], [place] [address]    Culture   Final    FEW PROTEUS PENNERI NO ANAEROBES ISOLATED; CULTURE IN PROGRESS FOR 5 DAYS    Report Status PENDING  Incomplete   Organism ID, Bacteria PROTEUS PENNERI  Final      Susceptibility   Proteus penneri - MIC*    AMPICILLIN >=32 RESISTANT Resistant     CEFAZOLIN >=64 RESISTANT Resistant     CEFEPIME <=0.12 SENSITIVE Sensitive     CEFTAZIDIME <=1 SENSITIVE Sensitive     CEFTRIAXONE <=0.25 SENSITIVE Sensitive     CIPROFLOXACIN <=0.25 SENSITIVE Sensitive     GENTAMICIN <=1 SENSITIVE Sensitive     IMIPENEM 4 SENSITIVE Sensitive     TRIMETH/SULFA <=20 SENSITIVE Sensitive     AMPICILLIN/SULBACTAM 16 INTERMEDIATE Intermediate     PIP/TAZO <=4 SENSITIVE Sensitive     * FEW PROTEUS PENNERI     Janene Madeira, MSN, NP-C [facility] [email or web address] Pager: [phone] Office: [phone] [place]: [phone]

## 2020-10-04 NOTE — Progress Notes (Signed)
Pharmacy Antibiotic Note  [name] is a 59 y.o. male admitted on 09/30/2020 with left hand ischemia.  Pertinent history significant for ESRD on MWF HD, bilateral BKAs and multiple finger amputations.  Patient is s/p OR for washout and Pharmacy has been consulted for Septra dosing.  Plan: Continue bactrim 1SS tab PO BID Monitor HD schedule/tolerance, clinical progress, K+ F/u LOT *Pharmacy will sign off as no further dose adjustments are anticipated.   Height: 6\' 4"  (193 cm) Weight: 88.1 kg (194 lb 3.6 oz) IBW/kg (Calculated) : 86.8  Temp (24hrs), Avg:98.5 F (36.9 C), Min:98.3 F (36.8 C), Max:98.7 F (37.1 C)  Recent Labs  Lab 09/30/20 1747 10/01/20 0354 10/01/20 1047 10/02/20 0228 10/03/20 0215  WBC 10.6* 11.1*  --  9.6  --   CREATININE 10.60* 5.75* 6.80* 7.76* 9.54*    Estimated Creatinine Clearance: 10 mL/min (A) (by C-G formula based on SCr of 9.54 mg/dL (H)).    No Known Allergies  Septra 10/29 >>  10/28 surgical PCR - negative 10/29 wound cx (deep) - proteus  Salome Arnt, PharmD, BCPS Clinical Pharmacist Please see AMION for all pharmacy numbers 10/04/2020 9:15 AM

## 2020-10-04 NOTE — Progress Notes (Signed)
Physical Therapy Treatment Patient Details Name: [name] MRN: [id number] DOB: Sep 19, 1961 Today's Date: 10/04/2020    History of Present Illness 59 y.o. male with medical history significant of PVD s/p B BKA; DM; HTN; and ESRD on MWF HD presenting for recurrent L hand ischemia/infection. His initial R interphalanx amputation was on 4/25 with R index finger amputation on 4/9, R middle and 4th finger amputations on 5/22, and L forefinger amputation on 10/4. 10/29 to OR: Rt hand I&D dorsal aspect, Lt hand I&D over index MCP joint.    PT Comments    Pt supine on arrival, agreeable to therapy session with good participation and tolerance for session. Primary session focus on bed mobility, seated balance challenges, and supine/sidelying/seated BLE/BUE therex to progress strength/endurance. HEP handout given to pt ([email or web address] Access Code: [email or web address]) and encouraged him to perform TID as able. Pt performed bed mobility with minA, needing increased assist from flat bed setup and cues for strategy to prevent pressure on B hands, rolling modI with use of rails, needed minA to remain fully on side during side-lying therapeutic exercises, and modI for posterior supine scoot toward HOB (using elbows extended). Pt performed seated balance challenges with good effort, able to reach 3-4" anterior/laterally outside BOS only and reports fear of leaning further due to possible LOB. Deferred standing due to arrival of case mgmt to speak with pt about CIR. Pt continues to benefit from PT services to progress toward functional mobility goals. Continue to recommend CIR .   Follow Up Recommendations  CIR     Equipment Recommendations  Rolling walker with 5" wheels;Other (comment) (Bilat platforms for RW; power chair (connect him to services/PCP to obtain))    Recommendations for Other Services OT consult;Rehab consult     Precautions / Restrictions Precautions Precautions: Fall Precaution  Comments: denies h/o falls Required Braces or Orthoses:  (BLE prosthetics in room, not donned during session) Restrictions Weight Bearing Restrictions: No Other Position/Activity Restrictions: MD did not specify, however encouraged pt to minimize WB'g through hands    Mobility  Bed Mobility Overal bed mobility: Needs Assistance Bed Mobility: Rolling;Supine to Sit;Sit to Supine Rolling: Modified independent (Device/Increase time)   Supine to sit: Min assist Sit to supine: HOB elevated;Modified independent (Device/Increase time)   General bed mobility comments: from flat HOB, pt required minA/hooking LUE elbow through therapist arm as bar to pull himself up to seated position (pt modI when [facility] fully elevated). pt performed posterior supine scoot modI using BUE extended and elbows to reposition toward [facility]  Transfers                 General transfer comment: deferred standing, pt Case Mgmt arriving to room to speak with pt about dispo  Ambulation/Gait                 Stairs             Wheelchair Mobility    Modified Rankin (Stroke Patients Only)       Balance Overall balance assessment: Needs assistance Sitting-balance support: No upper extremity supported Sitting balance-Leahy Scale: Fair Sitting balance - Comments: pt performed lateral leans with elbow taps, anterior reaching although had difficulty reaching more than 3-4" outside BOS prior to feeling like he would lose his balance unsupported. able to flex B shoulders in seated position without LOB  Cognition Arousal/Alertness: Awake/alert Behavior During Therapy: WFL for tasks assessed/performed Overall Cognitive Status: Within Functional Limits for tasks assessed                                        Exercises Amputee Exercises Towel Squeeze: AROM;Strengthening;Both;10 reps;Supine (5 SH) Hip Extension: AROM;Strengthening;Both;10  reps;Supine;Sidelying Hip ABduction/ADduction: AROM;Strengthening;Both;10 reps;Sidelying Hip Flexion/Marching: AROM;Strengthening;Both;10 reps;Supine Knee Flexion: AROM;Strengthening;Both;Sidelying;10 reps Knee Extension: AROM;Strengthening;Both;10 reps;Seated    General Comments        Pertinent Vitals/Pain Pain Assessment: 0-10 Pain Score: 8  Pain Location: bil hands Pain Descriptors / Indicators: Constant;Guarding;Operative site guarding Pain Intervention(s): Monitored during session;Repositioned;Other (comment) (offered ice however pt defers)   Vitals:   10/04/20 0803  BP: (!) 178/96  Pulse: 69  Resp: 17  Temp: 98.5 F (36.9 C)  SpO2: 98%    Home Living                      Prior Function            PT Goals (current goals can now be found in the care plan section) Acute Rehab PT Goals Patient Stated Goal: To continue to help people and be independent  PT Goal Formulation: With patient Time For Goal Achievement: 10/16/20 Potential to Achieve Goals: Good Progress towards PT goals: Progressing toward goals    Frequency    Min 4X/week      PT Plan Current plan remains appropriate    Co-evaluation              AM-PAC PT "6 Clicks" Mobility   Outcome Measure  Help needed turning from your back to your side while in a flat bed without using bedrails?: None Help needed moving from lying on your back to sitting on the side of a flat bed without using bedrails?: A Little Help needed moving to and from a bed to a chair (including a wheelchair)?: A Little Help needed standing up from a chair using your arms (e.g., wheelchair or bedside chair)?: A Lot Help needed to walk in hospital room?: A Little Help needed climbing 3-5 steps with a railing? : Total 6 Click Score: 16    End of Session   Activity Tolerance: Patient tolerated treatment well Patient left: in bed;with call bell/phone within reach Nurse Communication: Patient requests pain  meds PT Visit Diagnosis: Other abnormalities of gait and mobility (R26.89);Pain Pain - Right/Left: Left Pain - part of body: Hand     Time: 5631-4970 PT Time Calculation (min) (ACUTE ONLY): 28 min  Charges:  $Therapeutic Exercise: 8-22 mins $Therapeutic Activity: 8-22 mins                     Yeilyn Gent P., PTA Acute Rehabilitation Services Pager: [phone] Office: [place] 10/04/2020, 11:44 AM

## 2020-10-04 NOTE — Care Management Important Message (Signed)
Important Message  Patient Details  Name: [name] MRN: [id number] Date of Birth: [birth year]   Medicare Important Message Given:  Yes - Important Message mailed due to current National Emergency  Verbal consent obtained due to current National Emergency  Relationship to patient: Self Contact Name: Christia Coaxum Call Date: 10/04/20  Time: 1139 Phone: [phone] Outcome: Spoke with contact Important Message mailed to: Patient address on file    Delorse Lek 10/04/2020, 11:39 AM

## 2020-10-04 NOTE — Progress Notes (Signed)
[facility] Progress Note   Subjective:   Seen in room. No specific concerns today, denies SOB, CP and dizziness. Says he has some tremors from lyrica but they are stable/tolerable.   Objective Vitals:   10/03/20 1633 10/03/20 1936 10/04/20 0620 10/04/20 0803  BP: (!) 157/80 134/74 (!) 174/86 (!) 178/96  Pulse: 70 70 68 69  Resp: 17 16 17 17   Temp: 98.7 F (37.1 C) 98.6 F (37 C) 98.3 F (36.8 C) 98.5 F (36.9 C)  TempSrc:  Oral Oral Oral  SpO2: 100% 100% 100% 98%  Weight:      Height:       Physical Exam General: Well developed, alert male in NAD Heart: RRR, no murmurs, rubs or gallops Lungs: CTA bilaterally without wheezing, rhonchi or rales Abdomen: Soft, non-tender, non-distended, +BS Extremities: B/l BKA, b/l hands wrapped, no peripheral edema Dialysis Access:  LUE AVF + bruit  Additional Objective Labs: Basic Metabolic Panel: Recent Labs  Lab 09/30/20 1747 09/30/20 1747 10/01/20 0354 10/01/20 0354 10/01/20 1047 10/02/20 0228 10/03/20 0215  NA 140   < > 135   < > 136 135 137  K 3.6   < > 2.6*   < > 3.1* 3.4* 3.6  CL 100   < > 94*   < > 94* 95* 96*  CO2 22   < > 27  --   --  24 26  GLUCOSE 105*   < > 93   < > 114* 107* 102*  BUN 65*   < > 23   < > 27* 41* 52*  CREATININE 10.60*   < > 5.75*   < > 6.80* 7.76* 9.54*  CALCIUM 8.4*   < > 8.1*  --   --  8.3* 8.2*  PHOS 8.3*  --   --   --   --  7.7* 9.5*   < > = values in this interval not displayed.   Liver Function Tests: Recent Labs  Lab 09/30/20 1747 10/02/20 0228 10/03/20 0215  ALBUMIN 2.8* 2.7* 2.6*   CBC: Recent Labs  Lab 09/30/20 1747 09/30/20 1747 10/01/20 0354 10/01/20 1047 10/02/20 0228 10/03/20 0215 10/03/20 1001  WBC 10.6*  --  11.1*  --  9.6  --   --   NEUTROABS  --   --   --   --  6.8  --   --   HGB 8.1*   < > 8.3*   < > 8.8* 7.5* 7.8*  HCT 26.8*   < > 27.6*   < > 28.8* 24.9* 26.1*  MCV 75.5*  --  75.0*  --  76.0*  --   --   PLT 186  --  162  --  173  --   --     < > = values in this interval not displayed.   Blood Culture    Component Value Date/Time   SDES WOUND 10/01/2020 1140   SPECREQUEST LEFT HAND SPEC A 10/01/2020 1140   CULT  10/01/2020 1140    FEW PROTEUS PENNERI HOLDING FOR POSSIBLE ANAEROBE Performed at [facility], [place] [address] [place], [place] [address]    REPTSTATUS PENDING 10/01/2020 1140   CBG: Recent Labs  Lab 10/02/20 2059 10/03/20 0703 10/03/20 1204 10/03/20 2103 10/04/20 0645  GLUCAP 102* 89 96 92 108*   Medications: . sodium chloride    . sodium chloride     . amLODipine  10 mg Oral Daily  . atorvastatin  20  mg Oral QHS  . calcitRIOL  1.8 mcg Intravenous Once per day on Mon Wed Fri  . carvedilol  12.5 mg Oral Q breakfast  . carvedilol  12.5 mg Oral Once per day on Sun Tue Thu Sat  . Chlorhexidine Gluconate Cloth  6 each Topical Q0600  . Chlorhexidine Gluconate Cloth  6 each Topical Q0600  . darbepoetin (ARANESP) injection - DIALYSIS  150 mcg Intravenous Q Mon-HD  . docusate sodium  100 mg Oral BID  . heparin  5,000 Units Subcutaneous Q8H  . multivitamin  1 tablet Oral Q M,W,F-HD  . pregabalin  100 mg Oral QHS  . sevelamer carbonate  2,400 mg Oral TID WC  . sulfamethoxazole-trimethoprim  1 tablet Oral Q12H    Dialysis Orders: GO MWF 4h 21min 200NRe 425/800 EDW 84kg 2K/2Ca L AVF No heparin  Venofer 100mg  IV x 10 (until 11/15) Mircera 150 q 2 weeks (last 10/15)  Calcitriol 1.75 TIW  Assessment/Plan: 1. PVD/L hand ischemia/infection. S/p recent amp L index finger. S/p OR washout on 10/29 2. ESRD -HD MWF. Dialyzed off schedulefinishing very early Fri AMin anticipation of surgery. Post HD Hypokalemia noted. Got KCl x1. Trend labs.He refused HD Saturday  and no absolute indication. Planned for HD today, resume MWF schedule.  3. Hypertension/volume - Blood pressures elevated better today. Have been running high as outpatient as well. EDW just lowered. On Coreg. May be aggrivated by pain as  well. Challenge UF as tolerated, may need additional antihypertensive medication.  4. Anemia - Hgb 7.8. Due for ESA redose. Will give aranesp 150 with HD today. 5. Metabolic bone disease -Ca ok. Phos is elevated, will increase renvela dose.  6. DMT2 -per primary  Anice Paganini, PA-C 10/04/2020, 11:35 AM  [facility] Pager: [phone]

## 2020-10-04 NOTE — Progress Notes (Signed)
PROGRESS NOTE  [name] NLZ:[id number] DOB: 1961/07/06   PCP: [name], FNP  Patient is from: Home.  Lives his wife.  DOA: 09/30/2020 LOS: 4  Chief complaints: Left hand pain/ischemia  Brief Narrative / Interim history: 59 year old male with history of ESRD on HD MWF, DM-2, HTN, PVD s/p bilateral BKA with BKA wounds and multiple finger amputations with recent left index finger amputation sent to ED by Dr. Burney Gauze for I&D and debridement of right hand wound.  Patient is also followed by ID, Dr. Megan Salon outpatient.  He is on Bactrim outpatient.  Nephrology consulted.  He had HD on 10/28. Patient underwent irrigation and debridement bilateral hand wounds by Dr. Burney Gauze on 10/01/2020.  Wound culture from left hand grew Proteus penneri sensitive to Bactrim that he is already taking per ID recommendation.  Per Dr. Burney Gauze, patient need daily home health RN for dressing change and wound care when he is ready for discharge.  Therapy recommended CIR.  Subjective: Seen and examined earlier this morning.  No major events overnight of this morning.  Reports 9/10 pain in his hands.  No other complaints.  Denies chest pain, dyspnea, GI or UTI symptoms.  Reports regular bowel movement.  He denies melena or hematochezia. Objective: Vitals:   10/03/20 1633 10/03/20 1936 10/04/20 0620 10/04/20 0803  BP: (!) 157/80 134/74 (!) 174/86 (!) 178/96  Pulse: 70 70 68 69  Resp: 17 16 17 17   Temp: 98.7 F (37.1 C) 98.6 F (37 C) 98.3 F (36.8 C) 98.5 F (36.9 C)  TempSrc:  Oral Oral Oral  SpO2: 100% 100% 100% 98%  Weight:      Height:        Intake/Output Summary (Last 24 hours) at 10/04/2020 1147 Last data filed at 10/04/2020 0737 Gross per 24 hour  Intake 720 ml  Output --  Net 720 ml   Filed Weights   09/30/20 1940 10/01/20 1050  Weight: 88.1 kg 88.1 kg    Examination:  GENERAL: No apparent distress.  Nontoxic. HEENT: MMM.  Vision and hearing grossly intact.  NECK:  Supple.  No apparent JVD.  RESP:  No IWOB.  Fair aeration bilaterally. CVS:  RRR. Heart sounds normal.  ABD/GI/GU: BS+. Abd soft, NTND.  MSK/EXT: Bilateral BKA.S/p multiple finger amputations in both hands.  Dressing over both hands. SKIN: Chronic wounds over the right and left BKA without signs of infection or drainage. NEURO: Awake, alert and oriented appropriately.  No apparent focal neuro deficit. PSYCH: Calm. Normal affect.  Procedures:  10/01/2020-irrigation and debridement of right hand wound by Dr. Burney Gauze  Microbiology summarized: COVID-19 PCR negative. Influenza PCR negative.  Surgical tissue culture from left hand grew Proteus penneri sensitive to Bactrim.  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 Burney Gauze on 10/01/2020 -Surgical tissue culture as above. -ID following-currently on home Bactrim single strength -Per Dr. Burney Gauze daily WTD dressing for right hand starting 11/1, and dressing change in office for left  -Appreciate input by palliative medicine-outpatient palliative follow-up -Continue Lyrica, oxycodone with as needed IV morphine.  Increased oxycodone to 15 mg every 4 hours. -Therapy recommended CIR.  Patient is interested as well.  CIR consulted.  ESRD on HD MWF: Dialyzed on 09/30/2020 off schedule. -HD per nephrology  Malignant HTN: SBP 170s.  Could be due to fluid and pain. -Continue home Coreg 12.5 mg twice daily -  Added amlodipine 10 mg daily on 10/29. -As needed hydralazine with parameters.  Hypokalemia: K2.6> 3.6.  Resolved.  Hyperlipidemia -Continue Lipitor  Controlled DM-2 with neuropathy: A1c 5.3%.  CBG within normal range for most part. Recent Labs  Lab 10/02/20 2059 10/03/20 0703 10/03/20 1204 10/03/20 2103 10/04/20 0645  GLUCAP 102* 89 96 92 108*  -Discontinued CBG monitoring and SSI.  Goals of  care: CODE STATUS changed to DNR/DNI after meeting with palliative medicine but patient changed his mind.  Now full code. -Appreciate input by palliative-outpatient follow-up  LE wound/BKA wound: Unstageable.  No signs of infection. -Wound care consulted  Debility: Bilateral AKA with multiple finger amputation. -PT recommended CIR. OT eval pending.  CIR consulted  Body mass index is 23.64 kg/m.           DVT prophylaxis:  heparin injection 5,000 Units Start: 09/30/20 1900  Code Status: Full code Family Communication: Patient and/or RN. Available if any question.  Status is: Inpatient  Remains inpatient appropriate because:Unsafe d/c plan   Dispo: The patient is from: Home              Anticipated d/c is to: CIR              Anticipated d/c date is: 2 days              Patient currently is medically stable to d/c.       Consultants:  Surgery Infectious disease Nephrology Palliative medicine   Sch Meds:  Scheduled Meds: . amLODipine  10 mg Oral Daily  . atorvastatin  20 mg Oral QHS  . calcitRIOL  1.8 mcg Intravenous Once per day on Mon Wed Fri  . carvedilol  12.5 mg Oral Q breakfast  . carvedilol  12.5 mg Oral Once per day on Sun Tue Thu Sat  . Chlorhexidine Gluconate Cloth  6 each Topical Q0600  . Chlorhexidine Gluconate Cloth  6 each Topical Q0600  . darbepoetin (ARANESP) injection - DIALYSIS  150 mcg Intravenous Q Mon-HD  . docusate sodium  100 mg Oral BID  . heparin  5,000 Units Subcutaneous Q8H  . multivitamin  1 tablet Oral Q M,W,F-HD  . pregabalin  100 mg Oral QHS  . sevelamer carbonate  3,200 mg Oral TID WC  . sulfamethoxazole-trimethoprim  1 tablet Oral Q12H   Continuous Infusions: . sodium chloride    . sodium chloride     PRN Meds:.sodium chloride, sodium chloride, acetaminophen **OR** acetaminophen, albuterol, calcium carbonate (dosed in mg elemental calcium), camphor-menthol **AND** hydrOXYzine, feeding supplement (NEPRO CARB STEADY),  heparin, hydrALAZINE, lidocaine (PF), lidocaine-prilocaine, morphine injection, ondansetron **OR** ondansetron (ZOFRAN) IV, oxyCODONE, pentafluoroprop-tetrafluoroeth, polyethylene glycol, senna-docusate, sorbitol, zolpidem  Antimicrobials: Anti-infectives (From admission, onward)   Start     Dose/Rate Route Frequency Ordered Stop   10/01/20 2200  sulfamethoxazole-trimethoprim (BACTRIM) 400-80 MG per tablet 1 tablet        1 tablet Oral Every 12 hours 10/01/20 1524     10/01/20 1615  sulfamethoxazole-trimethoprim (BACTRIM DS) 800-160 MG per tablet 1 tablet        1 tablet Oral  Once 10/01/20 1524 10/01/20 1556   10/01/20 0815  ceFAZolin (ANCEF) IVPB 2g/100 mL premix  Status:  Discontinued        2 g 200 mL/hr over 30 Minutes Intravenous On call to O.R. 10/01/20 0725 10/01/20 1313       I have personally reviewed the following labs and images: CBC: Recent Labs  Lab 09/30/20 1747 09/30/20 1747  10/01/20 0354 10/01/20 1047 10/02/20 0228 10/03/20 0215 10/03/20 1001  WBC 10.6*  --  11.1*  --  9.6  --   --   NEUTROABS  --   --   --   --  6.8  --   --   HGB 8.1*   < > 8.3* 10.2* 8.8* 7.5* 7.8*  HCT 26.8*   < > 27.6* 30.0* 28.8* 24.9* 26.1*  MCV 75.5*  --  75.0*  --  76.0*  --   --   PLT 186  --  162  --  173  --   --    < > = values in this interval not displayed.   BMP &GFR Recent Labs  Lab 09/30/20 1747 10/01/20 0354 10/01/20 1047 10/02/20 0228 10/03/20 0215  NA 140 135 136 135 137  K 3.6 2.6* 3.1* 3.4* 3.6  CL 100 94* 94* 95* 96*  CO2 22 27  --  24 26  GLUCOSE 105* 93 114* 107* 102*  BUN 65* 23 27* 41* 52*  CREATININE 10.60* 5.75* 6.80* 7.76* 9.54*  CALCIUM 8.4* 8.1*  --  8.3* 8.2*  MG  --   --   --  1.8 2.0  PHOS 8.3*  --   --  7.7* 9.5*   Estimated Creatinine Clearance: 10 mL/min (A) (by C-G formula based on SCr of 9.54 mg/dL (H)). Liver & Pancreas: Recent Labs  Lab 09/30/20 1747 10/02/20 0228 10/03/20 0215  ALBUMIN 2.8* 2.7* 2.6*   No results for input(s):  LIPASE, AMYLASE in the last 168 hours. No results for input(s): AMMONIA in the last 168 hours. Diabetic: No results for input(s): HGBA1C in the last 72 hours. Recent Labs  Lab 10/02/20 2059 10/03/20 0703 10/03/20 1204 10/03/20 2103 10/04/20 0645  GLUCAP 102* 89 96 92 108*   Cardiac Enzymes: No results for input(s): CKTOTAL, CKMB, CKMBINDEX, TROPONINI in the last 168 hours. No results for input(s): PROBNP in the last 8760 hours. Coagulation Profile: Recent Labs  Lab 09/30/20 1747  INR 1.2   Thyroid Function Tests: No results for input(s): TSH, T4TOTAL, FREET4, T3FREE, THYROIDAB in the last 72 hours. Lipid Profile: No results for input(s): CHOL, HDL, LDLCALC, TRIG, CHOLHDL, LDLDIRECT in the last 72 hours. Anemia Panel: No results for input(s): VITAMINB12, FOLATE, FERRITIN, TIBC, IRON, RETICCTPCT in the last 72 hours. Urine analysis:    Component Value Date/Time   COLORURINE YELLOW 09/10/2018 1927   APPEARANCEUR HAZY (A) 09/10/2018 1927   LABSPEC 1.011 09/10/2018 1927   PHURINE 5.0 09/10/2018 1927   GLUCOSEU 50 (A) 09/10/2018 1927   HGBUR MODERATE (A) 09/10/2018 1927   BILIRUBINUR NEGATIVE 09/10/2018 1927   KETONESUR NEGATIVE 09/10/2018 1927   PROTEINUR 100 (A) 09/10/2018 1927   NITRITE NEGATIVE 09/10/2018 1927   LEUKOCYTESUR NEGATIVE 09/10/2018 1927   Sepsis Labs: Invalid input(s): PROCALCITONIN, [place]  Microbiology: Recent Results (from the past 240 hour(s))  Respiratory Panel by RT PCR (Flu A&B, Covid) - Nasopharyngeal Swab     Status: None   Collection Time: 09/30/20  3:37 PM   Specimen: Nasopharyngeal Swab  Result Value Ref Range Status   SARS Coronavirus 2 by RT PCR NEGATIVE NEGATIVE Final    Comment: (NOTE) SARS-CoV-2 target nucleic acids are NOT DETECTED.  The SARS-CoV-2 RNA is generally detectable in upper respiratoy specimens during the acute phase of infection. The lowest concentration of SARS-CoV-2 viral copies this assay can detect is 131  copies/mL. A negative result does not preclude SARS-Cov-2 infection and should not  be used as the sole basis for treatment or other patient management decisions. A negative result may occur with  improper specimen collection/handling, submission of specimen other than nasopharyngeal swab, presence of viral mutation(s) within the areas targeted by this assay, and inadequate number of viral copies (<131 copies/mL). A negative result must be combined with clinical observations, patient history, and epidemiological information. The expected result is Negative.  Fact Sheet for Patients:  PinkCheek.be  Fact Sheet for Healthcare Providers:  GravelBags.it  This test is no t yet approved or cleared by the [place] FDA and  has been authorized for detection and/or diagnosis of SARS-CoV-2 by FDA under an Emergency Use Authorization (EUA). This EUA will remain  in effect (meaning this test can be used) for the duration of the COVID-19 declaration under Section 564(b)(1) of the Act, 21 U.S.C. section 360bbb-3(b)(1), unless the authorization is terminated or revoked sooner.     Influenza A by PCR NEGATIVE NEGATIVE Final   Influenza B by PCR NEGATIVE NEGATIVE Final    Comment: (NOTE) The Xpert Xpress SARS-CoV-2/FLU/RSV assay is intended as an aid in  the diagnosis of influenza from Nasopharyngeal swab specimens and  should not be used as a sole basis for treatment. Nasal washings and  aspirates are unacceptable for Xpert Xpress SARS-CoV-2/FLU/RSV  testing.  Fact Sheet for Patients: PinkCheek.be  Fact Sheet for Healthcare Providers: GravelBags.it  This test is not yet approved or cleared by the [place] FDA and  has been authorized for detection and/or diagnosis of SARS-CoV-2 by  FDA under an Emergency Use Authorization (EUA). This EUA will remain  in effect (meaning  this test can be used) for the duration of the  Covid-19 declaration under Section 564(b)(1) of the Act, 21  U.S.C. section 360bbb-3(b)(1), unless the authorization is  terminated or revoked. Performed at [facility], [place] [address] [place], [place] [address]   Surgical pcr screen     Status: None   Collection Time: 09/30/20  3:47 PM   Specimen: Nasal Mucosa; Nasal Swab  Result Value Ref Range Status   MRSA, PCR NEGATIVE NEGATIVE Final   Staphylococcus aureus NEGATIVE NEGATIVE Final    Comment: (NOTE) The Xpert SA Assay (FDA approved for NASAL specimens in patients 66 years of age and older), is one component of a comprehensive surveillance program. It is not intended to diagnose infection nor to guide or monitor treatment. Performed at [facility], [place] [address], [place], [place] [address]   Aerobic/Anaerobic Culture (surgical/deep wound)     Status: None (Preliminary result)   Collection Time: 10/01/20 11:40 AM   Specimen: Soft Tissue, Other  Result Value Ref Range Status   Specimen Description WOUND  Final   Special Requests LEFT HAND SPEC A  Final   Gram Stain   Final    RARE WBC PRESENT, PREDOMINANTLY MONONUCLEAR NO ORGANISMS SEEN Performed at [facility], [place] [address], [place], [place] [address]    Culture   Final    FEW PROTEUS PENNERI NO ANAEROBES ISOLATED; CULTURE IN PROGRESS FOR 5 DAYS    Report Status PENDING  Incomplete   Organism ID, Bacteria PROTEUS PENNERI  Final      Susceptibility   Proteus penneri - MIC*    AMPICILLIN >=32 RESISTANT Resistant     CEFAZOLIN >=64 RESISTANT Resistant     CEFEPIME <=0.12 SENSITIVE Sensitive     CEFTAZIDIME <=1 SENSITIVE Sensitive     CEFTRIAXONE <=0.25 SENSITIVE Sensitive  CIPROFLOXACIN <=0.25 SENSITIVE Sensitive     GENTAMICIN <=1 SENSITIVE Sensitive     IMIPENEM 4 SENSITIVE Sensitive     TRIMETH/SULFA <=20 SENSITIVE Sensitive     AMPICILLIN/SULBACTAM 16 INTERMEDIATE Intermediate      PIP/TAZO <=4 SENSITIVE Sensitive     * FEW PROTEUS PENNERI    Radiology Studies: No results found.    Aylanie Cubillos T. [place]  If 7PM-7AM, please contact night-coverage [email or web address] 10/04/2020, 11:47 AM

## 2020-10-05 MED ORDER — CHLORHEXIDINE GLUCONATE CLOTH 2 % EX PADS
6.0000 | MEDICATED_PAD | Freq: Every day | CUTANEOUS | Status: DC
  Administered 2020-10-05 – 2020-10-06 (×2): 6 via TOPICAL

## 2020-10-05 MED ORDER — HYDRALAZINE HCL 25 MG PO TABS
25.0000 mg | ORAL_TABLET | Freq: Three times a day (TID) | ORAL | Status: DC
  Administered 2020-10-05 – 2020-10-06 (×5): 25 mg via ORAL
  Filled 2020-10-05 (×5): qty 1

## 2020-10-05 NOTE — Progress Notes (Signed)
Inpatient Rehabilitation Admissions Coordinator  CIR bed is not available to admit this patient today.  Danne Baxter, RN, MSN Rehab Admissions Coordinator [phone] 10/05/2020 10:47 AM

## 2020-10-05 NOTE — Progress Notes (Signed)
PROGRESS NOTE  [name] OEV:[id number] DOB: March 29, 1961   PCP: [name], FNP  Patient is from: Home.  Lives his wife.  DOA: 09/30/2020 LOS: 5  Chief complaints: Left hand pain/ischemia  Brief Narrative / Interim history: 59 year old male with history of ESRD on HD MWF, DM-2, HTN, PVD s/p bilateral BKA with BKA wounds and multiple finger amputations with recent left index finger amputation sent to ED by Dr. Burney Gauze for I&D and debridement of right hand wound.  Patient is also followed by ID, Dr. Megan Salon outpatient.  He is on Bactrim outpatient.  Nephrology consulted.  He had HD on 10/28. Patient underwent irrigation and debridement bilateral hand wounds by Dr. Burney Gauze on 10/01/2020.  Wound culture from left hand grew Proteus penneri sensitive to Bactrim that he is already taking per ID recommendation.  Per Dr. Burney Gauze, patient need daily home health RN for dressing change and wound care when he is ready for discharge.  Therapy recommended CIR. Waiting on CIR bed.  Subjective: Seen and examined earlier this morning.  No major events overnight of this morning.  Complains 9/10 pain in right hand after dressing change.  No other complaints.  Denies chest pain, or GI symptoms.  Objective: Vitals:   10/04/20 1800 10/04/20 1935 10/05/20 0300 10/05/20 0733  BP: (!) 162/88 (!) 153/84 (!) 152/79 (!) 183/80  Pulse:  76 70 68  Resp: 16 17 17 20   Temp: 98.5 F (36.9 C) 99.7 F (37.6 C) 99 F (37.2 C) 98.6 F (37 C)  TempSrc: Oral Oral Oral Oral  SpO2: 92%  100% 97%  Weight:      Height:        Intake/Output Summary (Last 24 hours) at 10/05/2020 1256 Last data filed at 10/05/2020 0900 Gross per 24 hour  Intake 720 ml  Output 2745 ml  Net -2025 ml   Filed Weights   09/30/20 1940 10/01/20 1050 10/04/20 1358  Weight: 88.1 kg 88.1 kg 87.5 kg    Examination:  GENERAL: No apparent distress.  Nontoxic. HEENT: MMM.  Vision and hearing grossly intact.  NECK: Supple.   No apparent JVD.  RESP: On room air.  No IWOB.  Fair aeration bilaterally. CVS:  RRR. Heart sounds normal.  ABD/GI/GU: BS+. Abd soft, NTND.  MSK/EXT: Bilateral BKA. S/p multiple finger amputations in both hands.  Dressing over both hands. SKIN: Chronic wounds over the right and left BKA without signs of infection or drainage. NEURO: Awake, alert and oriented appropriately.  No apparent focal neuro deficit. PSYCH: Calm. Normal affect.   Procedures:  10/01/2020-irrigation and debridement of right hand wound by Dr. Burney Gauze  Microbiology summarized: COVID-19 PCR negative. Influenza PCR negative.  Surgical tissue culture from left hand grew Proteus penneri sensitive to Bactrim.  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 Burney Gauze on 10/01/2020 -Surgical tissue culture as above. -ID recommended continuing single strength Bactrim for 2 more weeks from 11/1. -Per Dr. Burney Gauze daily WTD dressing for right hand starting 11/1, and dressing change in office for left  -Appreciate input by palliative medicine-outpatient palliative follow-up -Continue Lyrica, oxycodone with as needed IV morphine.  -CIR when bed available.  ESRD on HD MWF: Dialyzed on 09/30/2020 off schedule. -HD per nephrology  Malignant HTN: SBP 170s.  Could be due to fluid and pain. -Continue home Coreg 12.5 mg twice daily -Added amlodipine 10 mg daily  on 10/29. -Add hydralazine 25 mg every 8 hours -As needed hydralazine with parameters.  Controlled DM-2 with neuropathy: A1c 5.3%.  CBG within normal range for most part. -Discontinued CBG monitoring and SSI insulin.  Goals of care: CODE STATUS changed to DNR/DNI after meeting with palliative medicine but patient changed his mind.  Now full code. -Appreciate input by palliative-outpatient follow-up  LE wound/BKA wound: Unstageable.  No  signs of infection. -Wound care consulted  Debility: Bilateral AKA with multiple finger amputation. -PT recommended CIR. OT eval pending.  CIR consulted  Body mass index is 23.48 kg/m.           DVT prophylaxis:  heparin injection 5,000 Units Start: 09/30/20 1900  Code Status: Full code Family Communication: Patient and/or RN. Available if any question.  Status is: Inpatient  Remains inpatient appropriate because:Unsafe d/c plan   Dispo: The patient is from: Home              Anticipated d/c is to: CIR              Anticipated d/c date is: 1 day              Patient currently is medically stable to d/c.       Consultants:  Surgery-signed off Infectious disease-signed off. Nephrology-following Palliative medicine-signed off   Sch Meds:  Scheduled Meds: . amLODipine  10 mg Oral Daily  . atorvastatin  20 mg Oral QHS  . calcitRIOL  1.8 mcg Intravenous Once per day on Mon Wed Fri  . carvedilol  12.5 mg Oral Q breakfast  . carvedilol  12.5 mg Oral Once per day on Sun Tue Thu Sat  . Chlorhexidine Gluconate Cloth  6 each Topical Q0600  . darbepoetin (ARANESP) injection - DIALYSIS  150 mcg Intravenous Q Mon-HD  . docusate sodium  100 mg Oral BID  . heparin  5,000 Units Subcutaneous Q8H  . hydrALAZINE  25 mg Oral Q8H  . multivitamin  1 tablet Oral Q M,W,F-HD  . pregabalin  100 mg Oral QHS  . sevelamer carbonate  3,200 mg Oral TID WC  . sulfamethoxazole-trimethoprim  1 tablet Oral Daily   Continuous Infusions: . sodium chloride    . sodium chloride     PRN Meds:.sodium chloride, sodium chloride, acetaminophen **OR** acetaminophen, albuterol, calcium carbonate (dosed in mg elemental calcium), camphor-menthol **AND** hydrOXYzine, feeding supplement (NEPRO CARB STEADY), heparin, hydrALAZINE, lidocaine (PF), lidocaine-prilocaine, morphine injection, ondansetron **OR** ondansetron (ZOFRAN) IV, oxyCODONE, pentafluoroprop-tetrafluoroeth, polyethylene glycol, senna-docusate,  sorbitol, zolpidem  Antimicrobials: Anti-infectives (From admission, onward)   Start     Dose/Rate Route Frequency Ordered Stop   10/04/20 2200  sulfamethoxazole-trimethoprim (BACTRIM DS) 800-160 MG per tablet 1 tablet        1 tablet Oral Daily 10/04/20 1150     10/01/20 2200  sulfamethoxazole-trimethoprim (BACTRIM) 400-80 MG per tablet 1 tablet  Status:  Discontinued        1 tablet Oral Every 12 hours 10/01/20 1524 10/04/20 1150   10/01/20 1615  sulfamethoxazole-trimethoprim (BACTRIM DS) 800-160 MG per tablet 1 tablet        1 tablet Oral  Once 10/01/20 1524 10/01/20 1556   10/01/20 0815  ceFAZolin (ANCEF) IVPB 2g/100 mL premix  Status:  Discontinued        2 g 200 mL/hr over 30 Minutes Intravenous On call to O.R. 10/01/20 0725 10/01/20 1313       I have personally reviewed the following labs and images: CBC: Recent Labs  Lab 09/30/20 1747 09/30/20 1747 10/01/20 0354 10/01/20 0354 10/01/20 1047 10/02/20 0228 10/03/20 0215 10/03/20 1001 10/04/20 1553  WBC 10.6*  --  11.1*  --   --  9.6  --   --  9.2  NEUTROABS  --   --   --   --   --  6.8  --   --   --   HGB 8.1*   < > 8.3*   < > 10.2* 8.8* 7.5* 7.8* 7.7*  HCT 26.8*   < > 27.6*   < > 30.0* 28.8* 24.9* 26.1* 25.3*  MCV 75.5*  --  75.0*  --   --  76.0*  --   --  75.3*  PLT 186  --  162  --   --  173  --   --  193   < > = values in this interval not displayed.   BMP &GFR Recent Labs  Lab 09/30/20 1747 09/30/20 1747 10/01/20 0354 10/01/20 1047 10/02/20 0228 10/03/20 0215 10/04/20 1553  NA 140   < > 135 136 135 137 135  K 3.6   < > 2.6* 3.1* 3.4* 3.6 3.9  CL 100   < > 94* 94* 95* 96* 96*  CO2 22  --  27  --  24 26 20*  GLUCOSE 105*   < > 93 114* 107* 102* 118*  BUN 65*   < > 23 27* 41* 52* 70*  CREATININE 10.60*   < > 5.75* 6.80* 7.76* 9.54* 11.11*  CALCIUM 8.4*  --  8.1*  --  8.3* 8.2* 8.0*  MG  --   --   --   --  1.8 2.0  --   PHOS 8.3*  --   --   --  7.7* 9.5* 10.0*   < > = values in this interval not  displayed.   Estimated Creatinine Clearance: 8.6 mL/min (A) (by C-G formula based on SCr of 11.11 mg/dL (H)). Liver & Pancreas: Recent Labs  Lab 09/30/20 1747 10/02/20 0228 10/03/20 0215 10/04/20 1553  ALBUMIN 2.8* 2.7* 2.6* 2.5*   No results for input(s): LIPASE, AMYLASE in the last 168 hours. No results for input(s): AMMONIA in the last 168 hours. Diabetic: No results for input(s): HGBA1C in the last 72 hours. Recent Labs  Lab 10/02/20 2059 10/03/20 0703 10/03/20 1204 10/03/20 2103 10/04/20 0645  GLUCAP 102* 89 96 92 108*   Cardiac Enzymes: No results for input(s): CKTOTAL, CKMB, CKMBINDEX, TROPONINI in the last 168 hours. No results for input(s): PROBNP in the last 8760 hours. Coagulation Profile: Recent Labs  Lab 09/30/20 1747  INR 1.2   Thyroid Function Tests: No results for input(s): TSH, T4TOTAL, FREET4, T3FREE, THYROIDAB in the last 72 hours. Lipid Profile: No results for input(s): CHOL, HDL, LDLCALC, TRIG, CHOLHDL, LDLDIRECT in the last 72 hours. Anemia Panel: No results for input(s): VITAMINB12, FOLATE, FERRITIN, TIBC, IRON, RETICCTPCT in the last 72 hours. Urine analysis:    Component Value Date/Time   COLORURINE YELLOW 09/10/2018 1927   APPEARANCEUR HAZY (A) 09/10/2018 1927   LABSPEC 1.011 09/10/2018 1927   PHURINE 5.0 09/10/2018 1927   GLUCOSEU 50 (A) 09/10/2018 1927   HGBUR MODERATE (A) 09/10/2018 1927   BILIRUBINUR NEGATIVE 09/10/2018 1927   KETONESUR NEGATIVE 09/10/2018 1927   PROTEINUR 100 (A) 09/10/2018 1927   NITRITE NEGATIVE 09/10/2018 1927   LEUKOCYTESUR NEGATIVE 09/10/2018 1927   Sepsis Labs: Invalid input(s): PROCALCITONIN, [place]  Microbiology: Recent Results (from the past 240 hour(s))  Respiratory Panel by RT PCR (Flu A&B, Covid) - Nasopharyngeal Swab     Status: None   Collection Time: 09/30/20  3:37 PM   Specimen: Nasopharyngeal Swab  Result Value Ref Range Status   SARS Coronavirus 2 by RT PCR NEGATIVE NEGATIVE Final     Comment: (NOTE) SARS-CoV-2 target nucleic acids are NOT DETECTED.  The SARS-CoV-2 RNA is generally detectable in upper respiratoy specimens during the acute phase of infection. The lowest concentration of SARS-CoV-2 viral copies this assay can detect is 131 copies/mL. A negative result does not preclude SARS-Cov-2 infection and should not be used as the sole basis for treatment or other patient management decisions. A negative result may occur with  improper specimen collection/handling, submission of specimen other than nasopharyngeal swab, presence of viral mutation(s) within the areas targeted by this assay, and inadequate number of viral copies (<131 copies/mL). A negative result must be combined with clinical observations, patient history, and epidemiological information. The expected result is Negative.  Fact Sheet for Patients:  PinkCheek.be  Fact Sheet for Healthcare Providers:  GravelBags.it  This test is no t yet approved or cleared by the [place] FDA and  has been authorized for detection and/or diagnosis of SARS-CoV-2 by FDA under an Emergency Use Authorization (EUA). This EUA will remain  in effect (meaning this test can be used) for the duration of the COVID-19 declaration under Section 564(b)(1) of the Act, 21 U.S.C. section 360bbb-3(b)(1), unless the authorization is terminated or revoked sooner.     Influenza A by PCR NEGATIVE NEGATIVE Final   Influenza B by PCR NEGATIVE NEGATIVE Final    Comment: (NOTE) The Xpert Xpress SARS-CoV-2/FLU/RSV assay is intended as an aid in  the diagnosis of influenza from Nasopharyngeal swab specimens and  should not be used as a sole basis for treatment. Nasal washings and  aspirates are unacceptable for Xpert Xpress SARS-CoV-2/FLU/RSV  testing.  Fact Sheet for Patients: PinkCheek.be  Fact Sheet for Healthcare  Providers: GravelBags.it  This test is not yet approved or cleared by the [place] FDA and  has been authorized for detection and/or diagnosis of SARS-CoV-2 by  FDA under an Emergency Use Authorization (EUA). This EUA will remain  in effect (meaning this test can be used) for the duration of the  Covid-19 declaration under Section 564(b)(1) of the Act, 21  U.S.C. section 360bbb-3(b)(1), unless the authorization is  terminated or revoked. Performed at [facility], [place] [address] [place], [place] [address]   Surgical pcr screen     Status: None   Collection Time: 09/30/20  3:47 PM   Specimen: Nasal Mucosa; Nasal Swab  Result Value Ref Range Status   MRSA, PCR NEGATIVE NEGATIVE Final   Staphylococcus aureus NEGATIVE NEGATIVE Final    Comment: (NOTE) The Xpert SA Assay (FDA approved for NASAL specimens in patients 86 years of age and older), is one component of a comprehensive surveillance program. It is not intended to diagnose infection nor to guide or monitor treatment. Performed at [facility], [place] [address] [place], [place] [address]   Aerobic/Anaerobic Culture (surgical/deep wound)     Status: None (Preliminary result)   Collection Time: 10/01/20 11:40 AM   Specimen: Soft Tissue, Other  Result Value Ref Range Status   Specimen Description WOUND  Final   Special Requests LEFT HAND SPEC A  Final   Gram Stain   Final    RARE WBC PRESENT, PREDOMINANTLY MONONUCLEAR NO ORGANISMS SEEN Performed at  [facility], [place] [address] [place], [place] [address]    Culture   Final    FEW PROTEUS PENNERI FEW MORGANELLA MORGANII NO ANAEROBES ISOLATED; CULTURE IN PROGRESS FOR 5 DAYS    Report Status PENDING  Incomplete   Organism ID, Bacteria PROTEUS PENNERI  Final   Organism ID, Bacteria MORGANELLA MORGANII  Final      Susceptibility   Morganella morganii - MIC*    AMPICILLIN >=32 RESISTANT Resistant     CEFAZOLIN >=64 RESISTANT  Resistant     CEFTAZIDIME <=1 SENSITIVE Sensitive     CIPROFLOXACIN <=0.25 SENSITIVE Sensitive     GENTAMICIN <=1 SENSITIVE Sensitive     IMIPENEM 2 SENSITIVE Sensitive     TRIMETH/SULFA <=20 SENSITIVE Sensitive     AMPICILLIN/SULBACTAM 16 INTERMEDIATE Intermediate     PIP/TAZO <=4 SENSITIVE Sensitive     * FEW MORGANELLA MORGANII   Proteus penneri - MIC*    AMPICILLIN >=32 RESISTANT Resistant     CEFAZOLIN >=64 RESISTANT Resistant     CEFEPIME <=0.12 SENSITIVE Sensitive     CEFTAZIDIME <=1 SENSITIVE Sensitive     CEFTRIAXONE <=0.25 SENSITIVE Sensitive     CIPROFLOXACIN <=0.25 SENSITIVE Sensitive     GENTAMICIN <=1 SENSITIVE Sensitive     IMIPENEM 4 SENSITIVE Sensitive     TRIMETH/SULFA <=20 SENSITIVE Sensitive     AMPICILLIN/SULBACTAM 16 INTERMEDIATE Intermediate     PIP/TAZO <=4 SENSITIVE Sensitive     * FEW PROTEUS PENNERI    Radiology Studies: No results found.    Raghav Verrilli T. [place]  If 7PM-7AM, please contact night-coverage [email or web address] 10/05/2020, 12:56 PM

## 2020-10-05 NOTE — Plan of Care (Signed)

## 2020-10-05 NOTE — Progress Notes (Signed)
[facility] Progress Note   Subjective:   Seen and examined at bedside. Asks for dressing over AVF to be removed. Slight oozing from venous needle site which resolved with brief pressure. New dressing applied and pt advised to call RN if further bleeding occurs.   Objective Vitals:   10/04/20 1800 10/04/20 1935 10/05/20 0300 10/05/20 0733  BP: (!) 162/88 (!) 153/84 (!) 152/79 (!) 183/80  Pulse:  76 70 68  Resp: 16 17 17 20   Temp: 98.5 F (36.9 C) 99.7 F (37.6 C) 99 F (37.2 C) 98.6 F (37 C)  TempSrc: Oral Oral Oral Oral  SpO2: 92%  100% 97%  Weight:      Height:       Physical Exam General: Well developed, alert and in NAD Heart: RRR, no murmurs, rubs or gallops Lungs: CTA bilaterally without wheezing, rhonchi or rales Abdomen: Soft, non-tender, non-distended, +BS Extremities:B/l BKA, b/l hands wrapped, no peripheral edema Dialysis Access: Bandage removed, slight oozing of blood from venous needle site, resolved with brief pressure. + t/b  Additional Objective Labs: Basic Metabolic Panel: Recent Labs  Lab 10/02/20 0228 10/03/20 0215 10/04/20 1553  NA 135 137 135  K 3.4* 3.6 3.9  CL 95* 96* 96*  CO2 24 26 20*  GLUCOSE 107* 102* 118*  BUN 41* 52* 70*  CREATININE 7.76* 9.54* 11.11*  CALCIUM 8.3* 8.2* 8.0*  PHOS 7.7* 9.5* 10.0*   Liver Function Tests: Recent Labs  Lab 10/02/20 0228 10/03/20 0215 10/04/20 1553  ALBUMIN 2.7* 2.6* 2.5*   No results for input(s): LIPASE, AMYLASE in the last 168 hours. CBC: Recent Labs  Lab 09/30/20 1747 09/30/20 1747 10/01/20 0354 10/01/20 1047 10/02/20 0228 10/02/20 0228 10/03/20 0215 10/03/20 1001 10/04/20 1553  WBC 10.6*   < > 11.1*  --  9.6  --   --   --  9.2  NEUTROABS  --   --   --   --  6.8  --   --   --   --   HGB 8.1*   < > 8.3*   < > 8.8*   < > 7.5* 7.8* 7.7*  HCT 26.8*   < > 27.6*   < > 28.8*   < > 24.9* 26.1* 25.3*  MCV 75.5*  --  75.0*  --  76.0*  --   --   --  75.3*  PLT 186   < > 162   --  173  --   --   --  193   < > = values in this interval not displayed.   Blood Culture    Component Value Date/Time   SDES WOUND 10/01/2020 1140   SPECREQUEST LEFT HAND SPEC A 10/01/2020 1140   CULT  10/01/2020 1140    FEW PROTEUS PENNERI FEW MORGANELLA MORGANII NO ANAEROBES ISOLATED; CULTURE IN PROGRESS FOR 5 DAYS    REPTSTATUS PENDING 10/01/2020 1140   CBG: Recent Labs  Lab 10/02/20 2059 10/03/20 0703 10/03/20 1204 10/03/20 2103 10/04/20 0645  GLUCAP 102* 89 96 92 108*   Medications: . sodium chloride    . sodium chloride     . amLODipine  10 mg Oral Daily  . atorvastatin  20 mg Oral QHS  . calcitRIOL  1.8 mcg Intravenous Once per day on Mon Wed Fri  . carvedilol  12.5 mg Oral Q breakfast  . carvedilol  12.5 mg Oral Once per day on Sun Tue Thu Sat  . Chlorhexidine Gluconate Cloth  6  each Topical [id number]  . Chlorhexidine Gluconate Cloth  6 each Topical Q0600  . darbepoetin (ARANESP) injection - DIALYSIS  150 mcg Intravenous Q Mon-HD  . docusate sodium  100 mg Oral BID  . heparin  5,000 Units Subcutaneous Q8H  . hydrALAZINE  25 mg Oral Q8H  . multivitamin  1 tablet Oral Q M,W,F-HD  . pregabalin  100 mg Oral QHS  . sevelamer carbonate  3,200 mg Oral TID WC  . sulfamethoxazole-trimethoprim  1 tablet Oral Daily    Dialysis Orders: GO MWF 4h 38min 200NRe 425/800 EDW 84kg 2K/2Ca L AVF No heparin  Venofer 100mg  IV x 10 (until 11/15) Mircera 150 q 2 weeks (last 10/15)  Calcitriol 1.75 TIW  Assessment/Plan: 1. PVD/L hand ischemia/infection. S/p recent amp L index finger. S/p OR washout on 10/29. Awaiting CIR.  2. ESRD -HD MWF.  K+ running on the low side, using 4K bath.  3. Hypertension/volume - Blood pressures elevated. Have been running high as outpatient as well. EDW just lowered. On Coreg. Continue to challenge UF as tolerated, will need new lower EDW at discharge.  4. Anemia - Hgb 7.8.  givem aranesp 150 with HD 25/3. 5. Metabolic bone disease -Calcium  controlled. Phos is elevated, increased renvela dose 11/1.  6. DMT2 -per primary  Anice Paganini, PA-C 10/05/2020, 10:19 AM  [facility] Pager: [phone]

## 2020-10-06 ENCOUNTER — Encounter (HOSPITAL_COMMUNITY): Payer: Self-pay | Admitting: Physical Medicine & Rehabilitation

## 2020-10-06 ENCOUNTER — Inpatient Hospital Stay (HOSPITAL_COMMUNITY)
Admission: RE | Admit: 2020-10-06 | Discharge: 2020-10-16 | DRG: 559 | Disposition: A | Discharge status: Home Health | Payer: Medicare Other | Source: Intra-hospital | Attending: Physical Medicine & Rehabilitation | Admitting: Physical Medicine & Rehabilitation

## 2020-10-06 ENCOUNTER — Other Ambulatory Visit: Payer: Self-pay

## 2020-10-06 DIAGNOSIS — Z992 Dependence on renal dialysis: Secondary | ICD-10-CM

## 2020-10-06 DIAGNOSIS — Z89511 Acquired absence of right leg below knee: Secondary | ICD-10-CM

## 2020-10-06 DIAGNOSIS — I998 Other disorder of circulatory system: Secondary | ICD-10-CM

## 2020-10-06 DIAGNOSIS — E785 Hyperlipidemia, unspecified: Secondary | ICD-10-CM

## 2020-10-06 DIAGNOSIS — Z89512 Acquired absence of left leg below knee: Secondary | ICD-10-CM | POA: Diagnosis not present

## 2020-10-06 DIAGNOSIS — B964 Proteus (mirabilis) (morganii) as the cause of diseases classified elsewhere: Secondary | ICD-10-CM | POA: Diagnosis present

## 2020-10-06 DIAGNOSIS — Z87891 Personal history of nicotine dependence: Secondary | ICD-10-CM | POA: Diagnosis not present

## 2020-10-06 DIAGNOSIS — I10 Essential (primary) hypertension: Secondary | ICD-10-CM

## 2020-10-06 DIAGNOSIS — Z89029 Acquired absence of unspecified finger(s): Secondary | ICD-10-CM | POA: Diagnosis not present

## 2020-10-06 DIAGNOSIS — E1140 Type 2 diabetes mellitus with diabetic neuropathy, unspecified: Secondary | ICD-10-CM | POA: Diagnosis present

## 2020-10-06 DIAGNOSIS — E1152 Type 2 diabetes mellitus with diabetic peripheral angiopathy with gangrene: Secondary | ICD-10-CM | POA: Diagnosis present

## 2020-10-06 DIAGNOSIS — G4700 Insomnia, unspecified: Secondary | ICD-10-CM | POA: Diagnosis present

## 2020-10-06 DIAGNOSIS — I739 Peripheral vascular disease, unspecified: Secondary | ICD-10-CM | POA: Diagnosis not present

## 2020-10-06 DIAGNOSIS — L97819 Non-pressure chronic ulcer of other part of right lower leg with unspecified severity: Secondary | ICD-10-CM | POA: Diagnosis present

## 2020-10-06 DIAGNOSIS — D638 Anemia in other chronic diseases classified elsewhere: Secondary | ICD-10-CM

## 2020-10-06 DIAGNOSIS — K219 Gastro-esophageal reflux disease without esophagitis: Secondary | ICD-10-CM | POA: Diagnosis present

## 2020-10-06 DIAGNOSIS — N186 End stage renal disease: Secondary | ICD-10-CM | POA: Diagnosis not present

## 2020-10-06 DIAGNOSIS — E8889 Other specified metabolic disorders: Secondary | ICD-10-CM | POA: Diagnosis present

## 2020-10-06 DIAGNOSIS — G479 Sleep disorder, unspecified: Secondary | ICD-10-CM | POA: Diagnosis present

## 2020-10-06 DIAGNOSIS — E11622 Type 2 diabetes mellitus with other skin ulcer: Secondary | ICD-10-CM | POA: Diagnosis present

## 2020-10-06 DIAGNOSIS — D631 Anemia in chronic kidney disease: Secondary | ICD-10-CM | POA: Diagnosis present

## 2020-10-06 DIAGNOSIS — E1122 Type 2 diabetes mellitus with diabetic chronic kidney disease: Secondary | ICD-10-CM | POA: Diagnosis present

## 2020-10-06 DIAGNOSIS — R0989 Other specified symptoms and signs involving the circulatory and respiratory systems: Secondary | ICD-10-CM

## 2020-10-06 DIAGNOSIS — Z833 Family history of diabetes mellitus: Secondary | ICD-10-CM

## 2020-10-06 DIAGNOSIS — D72829 Elevated white blood cell count, unspecified: Secondary | ICD-10-CM | POA: Diagnosis not present

## 2020-10-06 DIAGNOSIS — Z8249 Family history of ischemic heart disease and other diseases of the circulatory system: Secondary | ICD-10-CM

## 2020-10-06 DIAGNOSIS — Z89021 Acquired absence of right finger(s): Secondary | ICD-10-CM

## 2020-10-06 DIAGNOSIS — E1169 Type 2 diabetes mellitus with other specified complication: Secondary | ICD-10-CM

## 2020-10-06 DIAGNOSIS — R627 Adult failure to thrive: Secondary | ICD-10-CM | POA: Diagnosis present

## 2020-10-06 DIAGNOSIS — Z7984 Long term (current) use of oral hypoglycemic drugs: Secondary | ICD-10-CM

## 2020-10-06 DIAGNOSIS — Z794 Long term (current) use of insulin: Secondary | ICD-10-CM

## 2020-10-06 DIAGNOSIS — Z79899 Other long term (current) drug therapy: Secondary | ICD-10-CM

## 2020-10-06 DIAGNOSIS — I120 Hypertensive chronic kidney disease with stage 5 chronic kidney disease or end stage renal disease: Secondary | ICD-10-CM | POA: Diagnosis present

## 2020-10-06 DIAGNOSIS — K5903 Drug induced constipation: Secondary | ICD-10-CM

## 2020-10-06 DIAGNOSIS — T8149XA Infection following a procedure, other surgical site, initial encounter: Secondary | ICD-10-CM

## 2020-10-06 DIAGNOSIS — Z4781 Encounter for orthopedic aftercare following surgical amputation: Principal | ICD-10-CM

## 2020-10-06 DIAGNOSIS — G8918 Other acute postprocedural pain: Secondary | ICD-10-CM

## 2020-10-06 DIAGNOSIS — Z6822 Body mass index (BMI) 22.0-22.9, adult: Secondary | ICD-10-CM

## 2020-10-06 DIAGNOSIS — Z89022 Acquired absence of left finger(s): Secondary | ICD-10-CM

## 2020-10-06 LAB — CBC
HCT: 26.1 % — ABNORMAL LOW (ref 39.0–52.0)
Hemoglobin: 7.8 g/dL — ABNORMAL LOW (ref 13.0–17.0)
MCH: 22.3 pg — ABNORMAL LOW (ref 26.0–34.0)
MCHC: 29.9 g/dL — ABNORMAL LOW (ref 30.0–36.0)
MCV: 74.6 fL — ABNORMAL LOW (ref 80.0–100.0)
Platelets: 192 10*3/uL (ref 150–400)
RBC: 3.5 MIL/uL — ABNORMAL LOW (ref 4.22–5.81)
RDW: 18.3 % — ABNORMAL HIGH (ref 11.5–15.5)
WBC: 9.6 10*3/uL (ref 4.0–10.5)
nRBC: 0 % (ref 0.0–0.2)

## 2020-10-06 LAB — AEROBIC/ANAEROBIC CULTURE W GRAM STAIN (SURGICAL/DEEP WOUND)

## 2020-10-06 LAB — RENAL FUNCTION PANEL
Albumin: 2.6 g/dL — ABNORMAL LOW (ref 3.5–5.0)
Anion gap: 16 — ABNORMAL HIGH (ref 5–15)
BUN: 60 mg/dL — ABNORMAL HIGH (ref 8–23)
CO2: 21 mmol/L — ABNORMAL LOW (ref 22–32)
Calcium: 8.6 mg/dL — ABNORMAL LOW (ref 8.9–10.3)
Chloride: 96 mmol/L — ABNORMAL LOW (ref 98–111)
Creatinine, Ser: 9.27 mg/dL — ABNORMAL HIGH (ref 0.61–1.24)
GFR, Estimated: 6 mL/min — ABNORMAL LOW (ref >60)
Glucose, Bld: 90 mg/dL (ref 70–99)
Phosphorus: 8.6 mg/dL — ABNORMAL HIGH (ref 2.5–4.6)
Potassium: 4 mmol/L (ref 3.5–5.1)
Sodium: 133 mmol/L — ABNORMAL LOW (ref 135–145)

## 2020-10-06 MED ORDER — DARBEPOETIN ALFA 150 MCG/0.3ML IJ SOSY: 150.0000 ug | PREFILLED_SYRINGE | INTRAMUSCULAR | Status: DC

## 2020-10-06 MED ORDER — SEVELAMER CARBONATE 800 MG PO TABS
3200.0000 mg | ORAL_TABLET | Freq: Three times a day (TID) | ORAL | Status: DC
  Administered 2020-10-07 – 2020-10-15 (×25): 3200 mg via ORAL
  Filled 2020-10-06 (×27): qty 4

## 2020-10-06 MED ORDER — HYDROXYZINE HCL 25 MG PO TABS: 25.0000 mg | ORAL_TABLET | Freq: Three times a day (TID) | ORAL | Status: DC | PRN

## 2020-10-06 MED ORDER — CALCITRIOL 1 MCG/ML IV SOLN
1.7500 ug | INTRAVENOUS | Status: DC
  Administered 2020-10-11 – 2020-10-15 (×2): 1.8 ug via INTRAVENOUS
  Filled 2020-10-06 (×4): qty 1.8

## 2020-10-06 MED ORDER — SULFAMETHOXAZOLE-TRIMETHOPRIM 800-160 MG PO TABS: 1.0000 | ORAL_TABLET | Freq: Every day | ORAL | 0 refills | Status: DC

## 2020-10-06 MED ORDER — AMLODIPINE BESYLATE 10 MG PO TABS
10.0000 mg | ORAL_TABLET | Freq: Every day | ORAL | Status: DC
  Administered 2020-10-07 – 2020-10-16 (×9): 10 mg via ORAL
  Filled 2020-10-06 (×10): qty 1

## 2020-10-06 MED ORDER — ACETAMINOPHEN 325 MG PO TABS
650.0000 mg | ORAL_TABLET | Freq: Four times a day (QID) | ORAL | Status: DC | PRN
  Administered 2020-10-06 – 2020-10-07 (×3): 650 mg via ORAL
  Filled 2020-10-06 (×2): qty 2

## 2020-10-06 MED ORDER — LIVING WELL WITH DIABETES BOOK
Freq: Once | Status: AC
  Filled 2020-10-06: qty 1

## 2020-10-06 MED ORDER — SENNOSIDES-DOCUSATE SODIUM 8.6-50 MG PO TABS
1.0000 | ORAL_TABLET | Freq: Two times a day (BID) | ORAL | Status: DC | PRN
  Administered 2020-10-07 – 2020-10-13 (×2): 1 via ORAL
  Filled 2020-10-06: qty 1

## 2020-10-06 MED ORDER — DOCUSATE SODIUM 100 MG PO CAPS
100.0000 mg | ORAL_CAPSULE | Freq: Two times a day (BID) | ORAL | Status: DC
  Administered 2020-10-06 – 2020-10-15 (×18): 100 mg via ORAL
  Filled 2020-10-06 (×18): qty 1

## 2020-10-06 MED ORDER — HEPARIN SODIUM (PORCINE) 5000 UNIT/ML IJ SOLN: 5000.0000 [IU] | Freq: Three times a day (TID) | INTRAMUSCULAR | Status: DC

## 2020-10-06 MED ORDER — HYDRALAZINE HCL 25 MG PO TABS
25.0000 mg | ORAL_TABLET | Freq: Three times a day (TID) | ORAL | 0 refills | Status: DC
Start: 2020-10-06 — End: 2020-10-16

## 2020-10-06 MED ORDER — ONDANSETRON HCL 4 MG PO TABS
4.0000 mg | ORAL_TABLET | Freq: Four times a day (QID) | ORAL | Status: DC | PRN
  Administered 2020-10-15: 4 mg via ORAL
  Filled 2020-10-06: qty 1

## 2020-10-06 MED ORDER — PREGABALIN 50 MG PO CAPS
100.0000 mg | ORAL_CAPSULE | Freq: Every day | ORAL | Status: DC
  Administered 2020-10-06 – 2020-10-15 (×10): 100 mg via ORAL
  Filled 2020-10-06 (×10): qty 2

## 2020-10-06 MED ORDER — HEPARIN SODIUM (PORCINE) 1000 UNIT/ML DIALYSIS: 1000.0000 [IU] | INTRAMUSCULAR | Status: DC | PRN

## 2020-10-06 MED ORDER — CALCIUM CARBONATE ANTACID 1250 MG/5ML PO SUSP
500.0000 mg | Freq: Four times a day (QID) | ORAL | Status: DC | PRN
  Administered 2020-10-09: 500 mg via ORAL
  Filled 2020-10-06 (×2): qty 5

## 2020-10-06 MED ORDER — CARVEDILOL 12.5 MG PO TABS
12.5000 mg | ORAL_TABLET | Freq: Every day | ORAL | Status: DC
  Administered 2020-10-07 – 2020-10-16 (×8): 12.5 mg via ORAL
  Filled 2020-10-06 (×10): qty 1

## 2020-10-06 MED ORDER — POLYETHYLENE GLYCOL 3350 17 G PO PACK: 17.0000 g | PACK | Freq: Two times a day (BID) | ORAL | Status: DC | PRN

## 2020-10-06 MED ORDER — SORBITOL 70 % SOLN
30.0000 mL | Status: DC | PRN
  Administered 2020-10-13 – 2020-10-15 (×2): 30 mL via ORAL
  Filled 2020-10-06 (×2): qty 30

## 2020-10-06 MED ORDER — ACETAMINOPHEN 650 MG RE SUPP: 650.0000 mg | Freq: Four times a day (QID) | RECTAL | Status: DC | PRN

## 2020-10-06 MED ORDER — RENA-VITE PO TABS
1.0000 | ORAL_TABLET | ORAL | Status: DC
  Administered 2020-10-08 – 2020-10-15 (×4): 1 via ORAL
  Filled 2020-10-06 (×3): qty 1

## 2020-10-06 MED ORDER — ONDANSETRON HCL 4 MG/2ML IJ SOLN: 4.0000 mg | Freq: Four times a day (QID) | INTRAMUSCULAR | Status: DC | PRN

## 2020-10-06 MED ORDER — BLOOD PRESSURE CONTROL BOOK
Freq: Once | Status: AC
  Filled 2020-10-06: qty 1

## 2020-10-06 MED ORDER — CARVEDILOL 12.5 MG PO TABS
12.5000 mg | ORAL_TABLET | ORAL | Status: DC
  Administered 2020-10-07 – 2020-10-14 (×6): 12.5 mg via ORAL
  Filled 2020-10-06 (×5): qty 1

## 2020-10-06 MED ORDER — ATORVASTATIN CALCIUM 10 MG PO TABS
20.0000 mg | ORAL_TABLET | Freq: Every day | ORAL | Status: DC
  Administered 2020-10-06 – 2020-10-11 (×6): 20 mg via ORAL
  Filled 2020-10-06 (×6): qty 2

## 2020-10-06 MED ORDER — CAMPHOR-MENTHOL 0.5-0.5 % EX LOTN: 1.0000 "application " | TOPICAL_LOTION | Freq: Three times a day (TID) | CUTANEOUS | Status: DC | PRN

## 2020-10-06 MED ORDER — HEPARIN SODIUM (PORCINE) 5000 UNIT/ML IJ SOLN
5000.0000 [IU] | Freq: Three times a day (TID) | INTRAMUSCULAR | Status: DC
  Administered 2020-10-06 – 2020-10-16 (×25): 5000 [IU] via SUBCUTANEOUS
  Filled 2020-10-06 (×27): qty 1

## 2020-10-06 MED ORDER — HYDRALAZINE HCL 25 MG PO TABS
25.0000 mg | ORAL_TABLET | Freq: Three times a day (TID) | ORAL | Status: DC
  Administered 2020-10-06 – 2020-10-09 (×7): 25 mg via ORAL
  Filled 2020-10-06 (×8): qty 1

## 2020-10-06 MED ORDER — SULFAMETHOXAZOLE-TRIMETHOPRIM 800-160 MG PO TABS
1.0000 | ORAL_TABLET | Freq: Every day | ORAL | Status: DC
  Administered 2020-10-06 – 2020-10-15 (×10): 1 via ORAL
  Filled 2020-10-06 (×10): qty 1

## 2020-10-06 MED ORDER — NEPRO/CARBSTEADY PO LIQD: 237.0000 mL | Freq: Three times a day (TID) | ORAL | Status: DC | PRN

## 2020-10-06 MED ORDER — ALBUTEROL SULFATE (2.5 MG/3ML) 0.083% IN NEBU: 2.5000 mg | INHALATION_SOLUTION | RESPIRATORY_TRACT | Status: DC | PRN

## 2020-10-06 MED ORDER — OXYCODONE HCL 5 MG PO TABS
15.0000 mg | ORAL_TABLET | ORAL | Status: DC | PRN
  Administered 2020-10-06 – 2020-10-14 (×30): 15 mg via ORAL
  Filled 2020-10-06 (×32): qty 3

## 2020-10-06 NOTE — Progress Notes (Signed)
Report given to Grey,RN at [phone]

## 2020-10-06 NOTE — Progress Notes (Signed)
OT Cancellation Note  Patient Details Name: [name] MRN: [id number] DOB: [birth year]   Cancelled Treatment:    Reason Eval/Treat Not Completed: Patient at procedure or test/ unavailable. Pt off unit at HD. Will follow-up for OT session as time allows.  Layla Maw 10/06/2020, 9:43 AM

## 2020-10-06 NOTE — Progress Notes (Signed)
This chaplain introduced herself to the Pt. in a F/U spiritual care visit with the opportunity for Advance Directive education.  The chaplain understands the Pt. prefers a Thursday morning visit, explaining he is tired from HD today.

## 2020-10-06 NOTE — Plan of Care (Signed)
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
## 2020-10-06 NOTE — Progress Notes (Signed)
PT Cancellation Note  Patient Details Name: [name] MRN: [id number] DOB: June 16, 1961   Cancelled Treatment:    Reason Eval/Treat Not Completed: (P) Patient at procedure or test/unavailable (pt at HD unit in AM. Will continue efforts per PT POC as schedule permits.)   Blima Jaimes M Kentley Cedillo 10/06/2020, 11:07 AM

## 2020-10-06 NOTE — Progress Notes (Signed)
Inpatient Rehabilitation Medication Review by a Pharmacist  A complete drug regimen review was completed for this patient to identify any potential clinically significant medication issues.  Clinically significant medication issues were identified:  yes   Type of Medication Issue Identified Description of Issue Urgent (address now) Non-Urgent (address on AM team rounds) Plan Plan Accepted by Provider? (Yes / No / Pending AM Rounds)  Drug Interaction(s) (clinically significant)       Duplicate Therapy       Allergy       No Medication Administration End Date  Bactrim DS 800/160 1 tab daily with stop date of 11/14 per ID recs. Non-Urgent Will add stop date to order.   Incorrect Dose       Additional Drug Therapy Needed       Other  PTA meds not resumed: Buprenorphine patch (removed 10/28) - pt with other PRN pain meds available. Tradjenta 5mg  daily - CBGs <120.  Not needed at this time.       Name of provider notified for urgent issues identified: n/a  Provider Method of Notification: n/a   For non-urgent medication issues to be resolved on team rounds tomorrow morning a [facility] [place] was sent to: n/a   Pharmacist comments: Will add stop date to Bactrim DS order.  Time spent performing this drug regimen review (minutes):  10   Yaziel Brandon, [place] Crafts 10/06/2020 7:33 PM
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
## 2020-10-06 NOTE — Progress Notes (Signed)
Inpatient Rehabilitation Admissions Coordinator  I have Cir bed to admit patient to today. I met with him in hemodialysis unit and he is in agreement to admit. I have notified Dr. Doristine Bosworth, acute team and TOC. I will make the arrangements to admit today.  Danne Baxter, RN, MSN Rehab Admissions Coordinator [phone] 10/06/2020 10:52 AM

## 2020-10-06 NOTE — Progress Notes (Signed)
Pt back to unit from HD, Pt remains alert/oriented in no apparent distress. No complaints voiced at this time.

## 2020-10-06 NOTE — Progress Notes (Signed)
Patient arrived on unit, oriented to unit. Reviewed medications, therapy schedule, rehab routine and plan of care. States an understanding of information reviewed. No complications noted at this time. [name]  
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
## 2020-10-06 NOTE — Discharge Summary (Signed)
Physician Discharge Summary  [name] QQI:[id number] DOB: [birth year] DOA: 09/30/2020  PCP: [name], FNP  Admit date: 09/30/2020 Discharge date: 10/06/2020  Admitted From: home Disposition: CIR  Recommendations for Outpatient Follow-up:  1. Continue Bactrim for 2 more weeks. 2. Daily wound care. 3. Dialysis as scheduled  Home Health: Going to CIR Equipment/Devices:none  Discharge Condition: Stable CODE STATUS: Full code Diet recommendation: Low-sodium and renal diet  Brief/Interim Summary: 59 year old male with history of ESRD on HD MWF, DM-2, HTN, PVD s/p bilateral BKA with BKA wounds and multiple finger amputations with recent left index finger amputation sent to ED by Dr. Burney Gauze for I&D and debridement of right hand wound.  Patient is also followed by ID, Dr. Megan Salon outpatient.  He is on Bactrim outpatient.  Nephrology consulted.  He had HD on 10/28. Patient underwent irrigation and debridement bilateral hand wounds by Dr. Burney Gauze on 10/01/2020.  Wound culture from left hand grew Proteus penneri sensitive to Bactrim that he is already taking per ID recommendation.  Per Dr. Burney Gauze, patient need daily home health RN for dressing change and wound care when he is ready for discharge. Therapy recommended CIR.  Critical limb ischemia of hands: -Status post I&D of both hands by orthopedic surgery Dr. Burney Gauze on 10/01/2020 -ID  consulted who recommended continuing single strength Bactrim for 2 more weeks from 11/1.  End date would be 11/15. -Per Dr. Burney Gauze daily WTD dressing for right hand starting 11/1, and dressing change in office for left. -Continued Lyrica, oxycodone as needed for pain -Patient will be discharged to CIR in a stable condition.   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 -PT/OT.  ESRD on HD MWF:  -HD per nephrology  Hypertensive urgency: Blood  pressure improved -Continued home Coreg 12.5 mg twice daily.  Added Amlodipine, hydralazine 25 mg every 8 hour During hospitalization.    Controlled DM-2 with neuropathy: A1c 5.3%.   -CBG remained within normal limits during hospitalization. -Continued Lyrica neuropathy  Goals of care: CODE STATUS changed to DNR/DNI after meeting with palliative medicine but patient changed his mind.  Now full code. -Appreciate input by palliative-recommended outpatient follow-up  LE wound/BKA wound: Unstageable.  No signs of infection. -Continue with wound care  Debility: Bilateral AKA with multiple finger amputation. 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   Diet - low sodium heart healthy   Complete by: As directed    Diet - low sodium heart healthy   Complete by: As directed    Discharge instructions   Complete by: As directed    You were hospitalized due to pain in your hands. You were treated surgically and  medically for this. Home health nurse will come to your home to start wet-to-dry dressing change on your right hand starting 10/04/2020. Dressing on left hand to be done by your surgeon at your next follow up. Please review your new medication list and the directions on your  medications before you take them. Go to your follow-up appointments as recommended.   Discharge instructions   Complete by: As directed    Follow-up with PCP and orthopedic surgery and palliative care after discharge from [place] with hemodialysis as a scheduled Continue with pain medicines as needed for pain Continue wound care as per recommendations Low-sodium diet and renal diet Continue Bactrim until 11/15.   Discharge wound care:   Complete by: As directed    Daily wet-to-dry dressing changes on the right hand starting on Monday (10/04/2020) Dressing change on left hand to be done at Surgery office at follow up   Increase activity slowly   Complete by: As directed    Increase activity slowly   Complete by: As directed      Allergies as of 10/06/2020   No Known Allergies     Medication List    STOP taking these medications   sulfamethoxazole-trimethoprim 400-80 MG tablet Commonly known as: BACTRIM Replaced by: sulfamethoxazole-trimethoprim 800-160 MG tablet     TAKE these medications   amLODipine 10 MG tablet Commonly known as: NORVASC Take 1 tablet (10 mg total) by mouth daily.   atorvastatin 20 MG tablet Commonly known as: LIPITOR Take 20 mg by mouth at bedtime.   buprenorphine 15 MCG/HR Commonly known as: BUTRANS Place 1 patch onto the skin once a week.   calcium carbonate (dosed in mg elemental calcium) 1250 MG/5ML Susp Take 5 mLs (500 mg of elemental calcium total) by mouth every 6 (six) hours as needed for indigestion.   carvedilol 12.5 MG tablet Commonly known as: COREG Take 12.5 mg by mouth 2 (two) times daily with a meal. Take once on dialysis days   DIALYVITE 800 WITH ZINC 0.8 MG Tabs Take 1 tablet by mouth every Monday, Wednesday, and Friday with hemodialysis.   famotidine 20 MG tablet Commonly known as: PEPCID Take 20 mg by mouth 2 (two) times daily as needed for heartburn or indigestion.   ferrous sulfate 325 (65 FE) MG tablet Take 1  tablet (325 mg total) by mouth daily with breakfast.   hydrALAZINE 25 MG tablet Commonly known as: APRESOLINE Take 1 tablet (25 mg total) by mouth every 8 (eight) hours.   methocarbamol 750 MG tablet Commonly known as: ROBAXIN Take 750 mg by mouth 3 (three) times daily as needed for muscle spasms.   oxyCODONE 5 MG immediate release tablet Commonly known as: Roxicodone 1-2 tabs PO q6 hours prn pain   oxyCODONE-acetaminophen 5-325 MG tablet Commonly known as: PERCOCET/ROXICET Take 1 tablet by mouth 4 (four) times daily as needed for breakthrough pain.   pregabalin 100 MG capsule Commonly known as: LYRICA Take 100 mg by mouth at bedtime.   senna-docusate 8.6-50 MG tablet Commonly known as: Senokot-S Take 1 tablet by mouth 2 (two) times daily as needed for moderate constipation.   sevelamer carbonate 800 MG tablet Commonly known as: RENVELA Take 3 tablets (2,400 mg total) by mouth 3 (three) times daily with meals.   sulfamethoxazole-trimethoprim 800-160 MG tablet Commonly known as: BACTRIM DS Take 1 tablet by mouth daily for 12 days. Replaces: sulfamethoxazole-trimethoprim 400-80 MG tablet   Tradjenta 5 MG Tabs tablet Generic drug: linagliptin Take 5  mg by mouth daily.            Discharge Care Instructions  (From admission, onward)         Start     Ordered   10/06/20 0000  Change dressing (specify)       Comments: As above.   10/06/20 1116   10/03/20 0000  Discharge wound care:       Comments: Daily wet-to-dry dressing changes on the right hand starting on Monday (10/04/2020) Dressing change on left hand to be done at Surgery office at follow up   10/03/20 1305          Follow-up Information    Charlotte Crumb, MD On 10/07/2020.   Specialty: Orthopedic Surgery Why: For wound re-check Contact information: [address] [place] [address] [phone]        Care, [facility] Follow up.   Specialty: Home Health Services Why: home  health service will be provided by [place] information: [place] [address] [phone]              No Known Allergies  Consultations:  Orthopedic surgery  ID  Palliative care   Procedures/Studies:  Incision and drainage of both hands  Dialysis    Subjective: Patient seen and examined at hemodialysis unit.  Resting comfortably on the bed.  Tells me that he feels better, denies any complaints including severe pain in hands, chest pain, shortness of breath, headache, blurry vision, leg swelling.  Discharge Exam: Vitals:   10/06/20 1030 10/06/20 1100  BP: (!) 160/79 117/73  Pulse: 66 68  Resp:    Temp:    SpO2:     Vitals:   10/06/20 0930 10/06/20 1000 10/06/20 1030 10/06/20 1100  BP: 105/66 (!) 147/73 (!) 160/79 117/73  Pulse: 69 66 66 68  Resp:      Temp:      TempSrc:      SpO2:      Weight:      Height:        General: Pt is alert, awake, not in acute distress, on room air, communicating well. Cardiovascular: RRR, S1/S2 +, no rubs, no gallops Respiratory: CTA bilaterally, no wheezing, no rhonchi Abdominal: Soft, NT, ND, bowel sounds + Extremities: Bilateral BKA.  Dressing dry intact in both hands.    The results of significant diagnostics from this hospitalization (including imaging, microbiology, ancillary and laboratory) are listed below for reference.     Microbiology: Recent Results (from the past 240 hour(s))  Respiratory Panel by RT PCR (Flu A&B, Covid) - Nasopharyngeal Swab     Status: None   Collection Time: 09/30/20  3:37 PM   Specimen: Nasopharyngeal Swab  Result Value Ref Range Status   SARS Coronavirus 2 by RT PCR NEGATIVE NEGATIVE Final    Comment: (NOTE) SARS-CoV-2 target nucleic acids are NOT DETECTED.  The SARS-CoV-2 RNA is generally detectable in upper respiratoy specimens during the acute phase of infection. The lowest concentration of SARS-CoV-2 viral copies this assay  can detect is 131 copies/mL. A negative result does not preclude SARS-Cov-2 infection and should not be used as the sole basis for treatment or other patient management decisions. A negative result may occur with  improper specimen collection/handling, submission of specimen other than nasopharyngeal swab, presence of viral mutation(s) within the areas targeted by this assay, and inadequate number of viral copies (<131 copies/mL). A negative result must be combined with clinical observations, patient  history, and epidemiological information. The expected result is Negative.  Fact Sheet for Patients:  PinkCheek.be  Fact Sheet for Healthcare Providers:  GravelBags.it  This test is no t yet approved or cleared by the [place] FDA and  has been authorized for detection and/or diagnosis of SARS-CoV-2 by FDA under an Emergency Use Authorization (EUA). This EUA will remain  in effect (meaning this test can be used) for the duration of the COVID-19 declaration under Section 564(b)(1) of the Act, 21 U.S.C. section 360bbb-3(b)(1), unless the authorization is terminated or revoked sooner.     Influenza A by PCR NEGATIVE NEGATIVE Final   Influenza B by PCR NEGATIVE NEGATIVE Final    Comment: (NOTE) The Xpert Xpress SARS-CoV-2/FLU/RSV assay is intended as an aid in  the diagnosis of influenza from Nasopharyngeal swab specimens and  should not be used as a sole basis for treatment. Nasal washings and  aspirates are unacceptable for Xpert Xpress SARS-CoV-2/FLU/RSV  testing.  Fact Sheet for Patients: PinkCheek.be  Fact Sheet for Healthcare Providers: GravelBags.it  This test is not yet approved or cleared by the [place] FDA and  has been authorized for detection and/or diagnosis of SARS-CoV-2 by  FDA under an Emergency Use Authorization (EUA). This EUA will remain   in effect (meaning this test can be used) for the duration of the  Covid-19 declaration under Section 564(b)(1) of the Act, 21  U.S.C. section 360bbb-3(b)(1), unless the authorization is  terminated or revoked. Performed at [facility], [place] [address], [place], [place] [address]   Surgical pcr screen     Status: None   Collection Time: 09/30/20  3:47 PM   Specimen: Nasal Mucosa; Nasal Swab  Result Value Ref Range Status   MRSA, PCR NEGATIVE NEGATIVE Final   Staphylococcus aureus NEGATIVE NEGATIVE Final    Comment: (NOTE) The Xpert SA Assay (FDA approved for NASAL specimens in patients 58 years of age and older), is one component of a comprehensive surveillance program. It is not intended to diagnose infection nor to guide or monitor treatment. Performed at [facility], [place] [address] [place], [place] [address]   Aerobic/Anaerobic Culture (surgical/deep wound)     Status: None   Collection Time: 10/01/20 11:40 AM   Specimen: Soft Tissue, Other  Result Value Ref Range Status   Specimen Description WOUND  Final   Special Requests LEFT HAND SPEC A  Final   Gram Stain   Final    RARE WBC PRESENT, PREDOMINANTLY MONONUCLEAR NO ORGANISMS SEEN    Culture   Final    FEW PROTEUS PENNERI FEW MORGANELLA MORGANII NO ANAEROBES ISOLATED Performed at [facility], [place] [address] [place], [place] [address]    Report Status 10/06/2020 FINAL  Final   Organism ID, Bacteria PROTEUS PENNERI  Final   Organism ID, Bacteria MORGANELLA MORGANII  Final      Susceptibility   Morganella morganii - MIC*    AMPICILLIN >=32 RESISTANT Resistant     CEFAZOLIN >=64 RESISTANT Resistant     CEFTAZIDIME <=1 SENSITIVE Sensitive     CIPROFLOXACIN <=0.25 SENSITIVE Sensitive     GENTAMICIN <=1 SENSITIVE Sensitive     IMIPENEM 2 SENSITIVE Sensitive     TRIMETH/SULFA <=20 SENSITIVE Sensitive     AMPICILLIN/SULBACTAM 16 INTERMEDIATE Intermediate     PIP/TAZO <=4 SENSITIVE Sensitive     *  FEW MORGANELLA MORGANII   Proteus penneri - MIC*    AMPICILLIN >=32 RESISTANT Resistant  CEFAZOLIN >=64 RESISTANT Resistant     CEFEPIME <=0.12 SENSITIVE Sensitive     CEFTAZIDIME <=1 SENSITIVE Sensitive     CEFTRIAXONE <=0.25 SENSITIVE Sensitive     CIPROFLOXACIN <=0.25 SENSITIVE Sensitive     GENTAMICIN <=1 SENSITIVE Sensitive     IMIPENEM 4 SENSITIVE Sensitive     TRIMETH/SULFA <=20 SENSITIVE Sensitive     AMPICILLIN/SULBACTAM 16 INTERMEDIATE Intermediate     PIP/TAZO <=4 SENSITIVE Sensitive     * FEW PROTEUS PENNERI     Labs: BNP (last 3 results) Recent Labs    03/14/20 0812 03/15/20 0438 03/16/20 0436  BNP 344.0* 246.0* 784.6*   Basic Metabolic Panel: Recent Labs  Lab 09/30/20 1747 09/30/20 1747 10/01/20 0354 10/01/20 0354 10/01/20 1047 10/02/20 0228 10/03/20 0215 10/04/20 1553 10/06/20 0748  NA 140   < > 135   < > 136 135 137 135 133*  K 3.6   < > 2.6*   < > 3.1* 3.4* 3.6 3.9 4.0  CL 100   < > 94*   < > 94* 95* 96* 96* 96*  CO2 22   < > 27  --   --  24 26 20* 21*  GLUCOSE 105*   < > 93   < > 114* 107* 102* 118* 90  BUN 65*   < > 23   < > 27* 41* 52* 70* 60*  CREATININE 10.60*   < > 5.75*   < > 6.80* 7.76* 9.54* 11.11* 9.27*  CALCIUM 8.4*   < > 8.1*  --   --  8.3* 8.2* 8.0* 8.6*  MG  --   --   --   --   --  1.8 2.0  --   --   PHOS 8.3*  --   --   --   --  7.7* 9.5* 10.0* 8.6*   < > = values in this interval not displayed.   Liver Function Tests: Recent Labs  Lab 09/30/20 1747 10/02/20 0228 10/03/20 0215 10/04/20 1553 10/06/20 0748  ALBUMIN 2.8* 2.7* 2.6* 2.5* 2.6*   No results for input(s): LIPASE, AMYLASE in the last 168 hours. No results for input(s): AMMONIA in the last 168 hours. CBC: Recent Labs  Lab 09/30/20 1747 09/30/20 1747 10/01/20 0354 10/01/20 1047 10/02/20 0228 10/03/20 0215 10/03/20 1001 10/04/20 1553 10/06/20 0748  WBC 10.6*  --  11.1*  --  9.6  --   --  9.2 9.6  NEUTROABS  --   --   --   --  6.8  --   --   --   --    HGB 8.1*   < > 8.3*   < > 8.8* 7.5* 7.8* 7.7* 7.8*  HCT 26.8*   < > 27.6*   < > 28.8* 24.9* 26.1* 25.3* 26.1*  MCV 75.5*  --  75.0*  --  76.0*  --   --  75.3* 74.6*  PLT 186  --  162  --  173  --   --  193 192   < > = values in this interval not displayed.   Cardiac Enzymes: No results for input(s): CKTOTAL, CKMB, CKMBINDEX, TROPONINI in the last 168 hours. BNP: Invalid input(s): POCBNP CBG: Recent Labs  Lab 10/02/20 2059 10/03/20 0703 10/03/20 1204 10/03/20 2103 10/04/20 0645  GLUCAP 102* 89 96 92 108*   D-Dimer No results for input(s): DDIMER in the last 72 hours. Hgb A1c No results for input(s): HGBA1C in the last 72 hours. Lipid  Profile No results for input(s): CHOL, HDL, LDLCALC, TRIG, CHOLHDL, LDLDIRECT in the last 72 hours. Thyroid function studies No results for input(s): TSH, T4TOTAL, T3FREE, THYROIDAB in the last 72 hours.  Invalid input(s): FREET3 Anemia work up No results for input(s): VITAMINB12, FOLATE, FERRITIN, TIBC, IRON, RETICCTPCT in the last 72 hours. Urinalysis    Component Value Date/Time   COLORURINE YELLOW 09/10/2018 1927   APPEARANCEUR HAZY (A) 09/10/2018 1927   LABSPEC 1.011 09/10/2018 1927   PHURINE 5.0 09/10/2018 1927   GLUCOSEU 50 (A) 09/10/2018 1927   HGBUR MODERATE (A) 09/10/2018 1927   BILIRUBINUR NEGATIVE 09/10/2018 1927   KETONESUR NEGATIVE 09/10/2018 1927   PROTEINUR 100 (A) 09/10/2018 1927   NITRITE NEGATIVE 09/10/2018 1927   LEUKOCYTESUR NEGATIVE 09/10/2018 1927   Sepsis Labs Invalid input(s): PROCALCITONIN,  WBC,  LACTICIDVEN Microbiology Recent Results (from the past 240 hour(s))  Respiratory Panel by RT PCR (Flu A&B, Covid) - Nasopharyngeal Swab     Status: None   Collection Time: 09/30/20  3:37 PM   Specimen: Nasopharyngeal Swab  Result Value Ref Range Status   SARS Coronavirus 2 by RT PCR NEGATIVE NEGATIVE Final    Comment: (NOTE) SARS-CoV-2 target nucleic acids are NOT DETECTED.  The SARS-CoV-2 RNA is generally  detectable in upper respiratoy specimens during the acute phase of infection. The lowest concentration of SARS-CoV-2 viral copies this assay can detect is 131 copies/mL. A negative result does not preclude SARS-Cov-2 infection and should not be used as the sole basis for treatment or other patient management decisions. A negative result may occur with  improper specimen collection/handling, submission of specimen other than nasopharyngeal swab, presence of viral mutation(s) within the areas targeted by this assay, and inadequate number of viral copies (<131 copies/mL). A negative result must be combined with clinical observations, patient history, and epidemiological information. The expected result is Negative.  Fact Sheet for Patients:  PinkCheek.be  Fact Sheet for Healthcare Providers:  GravelBags.it  This test is no t yet approved or cleared by the [place] FDA and  has been authorized for detection and/or diagnosis of SARS-CoV-2 by FDA under an Emergency Use Authorization (EUA). This EUA will remain  in effect (meaning this test can be used) for the duration of the COVID-19 declaration under Section 564(b)(1) of the Act, 21 U.S.C. section 360bbb-3(b)(1), unless the authorization is terminated or revoked sooner.     Influenza A by PCR NEGATIVE NEGATIVE Final   Influenza B by PCR NEGATIVE NEGATIVE Final    Comment: (NOTE) The Xpert Xpress SARS-CoV-2/FLU/RSV assay is intended as an aid in  the diagnosis of influenza from Nasopharyngeal swab specimens and  should not be used as a sole basis for treatment. Nasal washings and  aspirates are unacceptable for Xpert Xpress SARS-CoV-2/FLU/RSV  testing.  Fact Sheet for Patients: PinkCheek.be  Fact Sheet for Healthcare Providers: GravelBags.it  This test is not yet approved or cleared by the [place] FDA and   has been authorized for detection and/or diagnosis of SARS-CoV-2 by  FDA under an Emergency Use Authorization (EUA). This EUA will remain  in effect (meaning this test can be used) for the duration of the  Covid-19 declaration under Section 564(b)(1) of the Act, 21  U.S.C. section 360bbb-3(b)(1), unless the authorization is  terminated or revoked. Performed at [facility], [place] [address], [place], [place] [address]   Surgical pcr screen     Status: None   Collection Time: 09/30/20  3:47 PM  Specimen: Nasal Mucosa; Nasal Swab  Result Value Ref Range Status   MRSA, PCR NEGATIVE NEGATIVE Final   Staphylococcus aureus NEGATIVE NEGATIVE Final    Comment: (NOTE) The Xpert SA Assay (FDA approved for NASAL specimens in patients 1 years of age and older), is one component of a comprehensive surveillance program. It is not intended to diagnose infection nor to guide or monitor treatment. Performed at [facility], [place] [address], [place], [place] [address]   Aerobic/Anaerobic Culture (surgical/deep wound)     Status: None   Collection Time: 10/01/20 11:40 AM   Specimen: Soft Tissue, Other  Result Value Ref Range Status   Specimen Description WOUND  Final   Special Requests LEFT HAND SPEC A  Final   Gram Stain   Final    RARE WBC PRESENT, PREDOMINANTLY MONONUCLEAR NO ORGANISMS SEEN    Culture   Final    FEW PROTEUS PENNERI FEW MORGANELLA MORGANII NO ANAEROBES ISOLATED Performed at [facility], [place] [address], [place], [place] [address]    Report Status 10/06/2020 FINAL  Final   Organism ID, Bacteria PROTEUS PENNERI  Final   Organism ID, Bacteria MORGANELLA MORGANII  Final      Susceptibility   Morganella morganii - MIC*    AMPICILLIN >=32 RESISTANT Resistant     CEFAZOLIN >=64 RESISTANT Resistant     CEFTAZIDIME <=1 SENSITIVE Sensitive     CIPROFLOXACIN <=0.25 SENSITIVE Sensitive     GENTAMICIN <=1 SENSITIVE Sensitive     IMIPENEM 2 SENSITIVE Sensitive      TRIMETH/SULFA <=20 SENSITIVE Sensitive     AMPICILLIN/SULBACTAM 16 INTERMEDIATE Intermediate     PIP/TAZO <=4 SENSITIVE Sensitive     * FEW MORGANELLA MORGANII   Proteus penneri - MIC*    AMPICILLIN >=32 RESISTANT Resistant     CEFAZOLIN >=64 RESISTANT Resistant     CEFEPIME <=0.12 SENSITIVE Sensitive     CEFTAZIDIME <=1 SENSITIVE Sensitive     CEFTRIAXONE <=0.25 SENSITIVE Sensitive     CIPROFLOXACIN <=0.25 SENSITIVE Sensitive     GENTAMICIN <=1 SENSITIVE Sensitive     IMIPENEM 4 SENSITIVE Sensitive     TRIMETH/SULFA <=20 SENSITIVE Sensitive     AMPICILLIN/SULBACTAM 16 INTERMEDIATE Intermediate     PIP/TAZO <=4 SENSITIVE Sensitive     * FEW PROTEUS PENNERI     Time coordinating discharge: Over 30 minutes  SIGNED:   Mckinley Jewel, MD  [facility] 10/06/2020, 11:16 AM Pager   If 7PM-7AM, please contact night-coverage [email or web address]

## 2020-10-06 NOTE — Progress Notes (Signed)
[facility] Progress Note   Subjective:   Seen on HD. Feeling well, no concerns. Denies SOB, HA, CP, dizziness, N/V.   Objective Vitals:   10/06/20 0735 10/06/20 0740 10/06/20 0800 10/06/20 0815  BP: (!) 162/81 (!) 151/78 137/65 131/72  Pulse: 72 69 68 69  Resp: 18 18    Temp:      TempSrc:      SpO2:      Weight:      Height:       Physical Exam General: Well developed, alert and in NAD Heart: RRR, no murmurs, rubs or gallops Lungs: CTA bilaterally without wheezing, rhonchi or rales Abdomen: Soft, non-tender, non-distended, +BS Extremities:B/l BKA, b/l hands wrapped, no peripheral edema Dialysis Access: LUE AVF accessed  Additional Objective Labs: Basic Metabolic Panel: Recent Labs  Lab 10/02/20 0228 10/03/20 0215 10/04/20 1553  NA 135 137 135  K 3.4* 3.6 3.9  CL 95* 96* 96*  CO2 24 26 20*  GLUCOSE 107* 102* 118*  BUN 41* 52* 70*  CREATININE 7.76* 9.54* 11.11*  CALCIUM 8.3* 8.2* 8.0*  PHOS 7.7* 9.5* 10.0*   Liver Function Tests: Recent Labs  Lab 10/02/20 0228 10/03/20 0215 10/04/20 1553  ALBUMIN 2.7* 2.6* 2.5*   CBC: Recent Labs  Lab 09/30/20 1747 09/30/20 1747 10/01/20 0354 10/01/20 1047 10/02/20 0228 10/02/20 0228 10/03/20 0215 10/03/20 1001 10/04/20 1553  WBC 10.6*   < > 11.1*  --  9.6  --   --   --  9.2  NEUTROABS  --   --   --   --  6.8  --   --   --   --   HGB 8.1*   < > 8.3*   < > 8.8*   < > 7.5* 7.8* 7.7*  HCT 26.8*   < > 27.6*   < > 28.8*   < > 24.9* 26.1* 25.3*  MCV 75.5*  --  75.0*  --  76.0*  --   --   --  75.3*  PLT 186   < > 162  --  173  --   --   --  193   < > = values in this interval not displayed.   Blood Culture    Component Value Date/Time   SDES WOUND 10/01/2020 1140   SPECREQUEST LEFT HAND SPEC A 10/01/2020 1140   CULT  10/01/2020 1140    FEW PROTEUS PENNERI FEW MORGANELLA MORGANII NO ANAEROBES ISOLATED; CULTURE IN PROGRESS FOR 5 DAYS    REPTSTATUS PENDING 10/01/2020 1140   CBG: Recent Labs   Lab 10/02/20 2059 10/03/20 0703 10/03/20 1204 10/03/20 2103 10/04/20 0645  GLUCAP 102* 89 96 92 108*   Medications: . sodium chloride    . sodium chloride     . amLODipine  10 mg Oral Daily  . atorvastatin  20 mg Oral QHS  . calcitRIOL  1.8 mcg Intravenous Once per day on Mon Wed Fri  . carvedilol  12.5 mg Oral Q breakfast  . carvedilol  12.5 mg Oral Once per day on Sun Tue Thu Sat  . Chlorhexidine Gluconate Cloth  6 each Topical Q0600  . darbepoetin (ARANESP) injection - DIALYSIS  150 mcg Intravenous Q Mon-HD  . docusate sodium  100 mg Oral BID  . heparin  5,000 Units Subcutaneous Q8H  . hydrALAZINE  25 mg Oral Q8H  . multivitamin  1 tablet Oral Q M,W,F-HD  . pregabalin  100 mg Oral QHS  . sevelamer carbonate  3,200 mg Oral TID WC  . sulfamethoxazole-trimethoprim  1 tablet Oral Daily    Dialysis Orders: GO MWF 4h 37min 200NRe 425/800 EDW 84kg 2K/2Ca L AVF No heparin  Venofer 100mg  IV x 10 (until 11/15) Mircera 150 q 2 weeks (last 10/15)  Calcitriol 1.75 TIW  Assessment/Plan: 1. PVD/L hand ischemia/infection. S/p recent amp L index finger.S/pOR washout on 10/29. Awaiting CIR.  2. ESRD -HD MWF. K+ running on the low side, using 4K bath. Tolerating HD well today.  3. Hypertension/volume - Blood pressureselevated but improved. Have been running high as outpatient as well. EDW just lowered. On Coreg. Continue to challenge UF as tolerated, will need new lower EDW at discharge.  4. Anemia - Hgb7.7. givem aranesp 150 with HD 11/1.Will check iron studies. 5. Metabolic bone disease -Calcium controlled.Phos is elevated, increased renvela dose 11/1. 6. DMT2 -per primary    Anice Paganini, PA-C 10/06/2020, 8:26 AM  [facility] Pager: [phone]

## 2020-10-06 NOTE — Progress Notes (Signed)
Pl left unit to HD.
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
## 2020-10-06 NOTE — Progress Notes (Signed)
Pt left unit to 35m11, accompanied by staff. Pt remains alert/oriented in no apparent distress. No complaints. Voiced.

## 2020-10-06 NOTE — IPOC Note (Signed)
Individualized overall Plan of Care [facility]) Patient Details Name: [name] MRN: [id number] DOB: 10-17-1961  Admitting Diagnosis: Finger amputee  Hospital Problems: Principal Problem:   Finger amputee Active Problems:   S/P bilateral below knee amputation ([place])     Functional Problem List: Nursing Endurance, Medication Management, Pain, Safety, Motor  PT Balance, Endurance, Motor, Pain, Skin Integrity  OT Balance, Pain  SLP    TR         Basic ADL's: OT Eating, Grooming, Bathing, Dressing, Toileting     Advanced  ADL's: OT       Transfers: PT Bed Mobility, Bed to Chair, Car, Manufacturing systems engineer, Metallurgist: PT Ambulation, Emergency planning/management officer, Stairs     Additional Impairments: OT Fuctional Use of Upper Extremity  SLP        TR      Anticipated Outcomes Item Anticipated Outcome  Self Feeding modified independent  Swallowing      Basic self-care  supervision  Toileting  supervision   Bathroom Transfers supervision  Bowel/Bladder  manage bowel and bladder with mod I assist  Transfers  mod I  Locomotion  supervision with LRAD  Communication     Cognition     Pain  Pain level less than 5 on scale of 0-10  Safety/Judgment  remain free of injury, prevent falls with cues and reminders.   Therapy Plan: PT Intensity: Minimum of 1-2 x/day ,45 to 90 minutes PT Frequency: 5 out of 7 days PT Duration Estimated Length of Stay: 10-12 days OT Intensity: Minimum of 1-2 x/day, 45 to 90 minutes OT Frequency: 5 out of 7 days OT Duration/Estimated Length of Stay: 7-9 days      Team Interventions: Nursing Interventions Patient/Family Education, Pain Management, Discharge Planning, Medication Management, Psychosocial Support, Disease Management/Prevention  PT interventions Ambulation/gait training, Discharge planning, Functional mobility training, Psychosocial support, Therapeutic Activities, Balance/vestibular training, Disease  management/prevention, Neuromuscular re-education, Skin care/wound management, Therapeutic Exercise, Wheelchair propulsion/positioning, DME/adaptive equipment instruction, Pain management, Splinting/orthotics, UE/LE Strength taining/ROM, Community reintegration, Technical sales engineer stimulation, Patient/family education, UE/LE Coordination activities, Stair training  OT Interventions Training and development officer, Discharge planning, Pain management, Self Care/advanced ADL retraining, UE/LE Coordination activities, Therapeutic Activities, Functional mobility training, Patient/family education, Skin care/wound managment, Therapeutic Exercise, Community reintegration, Engineer, drilling, Neuromuscular re-education, Psychosocial support, UE/LE Strength taining/ROM, Wheelchair propulsion/positioning  SLP Interventions    TR Interventions    SW/CM Interventions Discharge Planning, Psychosocial Support, Patient/Family Education   Barriers to Discharge MD  Medical stability, Wound care, and Weight bearing restrictions  Nursing      PT Inaccessible home environment, Home environment access/layout, Wound Care, Hemodialysis, Weight bearing restrictions 1 STE with 0 rails, wounds on residual limb, inability to bear weight through hands  OT      SLP      SW Decreased caregiver support Wife will not always be there according to pt, at times will be alone   Team Discharge Planning: Destination: PT-Home ,OT- Home , SLP-  Projected Follow-up: PT-Home health PT, OT-  Home health OT, 24 hour supervision/assistance, SLP-  Projected Equipment Needs: PT-To be determined, OT- To be determined, SLP-  Equipment Details: PT-has rollator, OT-  Patient/family involved in discharge planning: PT- Patient,  OT-Patient, SLP-   MD ELOS: 5-7 days. Medical Rehab Prognosis:  Good Assessment: 59 year old right-handed male history of end-stage renal disease with hemodialysis Monday Wednesday Friday, diabetes  mellitus, hypertension, he quit smoking 8 years ago, PVD with right interphalanx amputation 03/28/2020  and right index finger amputation 03/12/2020 right middle and fourth finger amputations 04/24/2020 as well as 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  He presented on 09/22/2020 after recent amputation of left index finger for recurrent infection at the MP joint by Dr. Fredna Dow 09/06/2020.  He is maintained on antibiotic therapy. He did see Dr. Megan Salon in the infectious disease clinic 09/09/2020 felt the infection had cleared and antibiotics were discontinued.  He was referred to outpatient palliative care.  Patient with progressive decrease in mobility developing lower extremity ulcers on bilateral stumps and right knee.  Follow-up orthopedic services and patient underwent irrigation debridement bilateral hand wounds by Dr. Burney Gauze 10/01/2020.   At time of latest admission BUN 65 creatinine 10.60 glucose 105, WBC 10,600, hemoglobin 8.1.  Wound culture from left hand grew Proteus Penneri and currently maintained on Bactrim at the recommendations of Dr. Megan Salon infectious disease recommending continue through 10/17/2020.  Renal service follow-up with hemodialysis ongoing.  Maintained on subcutaneous heparin for DVT prophylaxis.  Wound care nurse follow-up for wounds bilateral BKA right knee as well as hand with dressing changes as directed.  Palliative care continues to follow to establish goals of care.  Patient with resulting functional deficits with mobility, self-care.  Will set goals for Supervision/Mod I at wheelchair level with PT/OT.   Due to the current state of emergency, patients may not be receiving their 3-hours of Medicare-mandated therapy.  See Team Conference Notes for weekly updates to the plan of care

## 2020-10-06 NOTE — Progress Notes (Signed)
Manufacturing engineer [facility]) Community Based Palliative Care       This patient has been referred to our palliative care services in the community.  [facility] will continue to follow for any discharge planning needs and to coordinate admission onto palliative care after CIR stay.   If you have questions or need assistance, please call [phone] or contact the hospital Liaison listed on AMION.     Thank you for the opportunity to participate in this patient's care.     Domenic Moras, BSN, RN [facility] Liaison   [phone]

## 2020-10-07 ENCOUNTER — Inpatient Hospital Stay (HOSPITAL_COMMUNITY): Payer: Medicare Other

## 2020-10-07 ENCOUNTER — Inpatient Hospital Stay (HOSPITAL_COMMUNITY): Payer: Medicare Other | Admitting: Occupational Therapy

## 2020-10-07 DIAGNOSIS — I10 Essential (primary) hypertension: Secondary | ICD-10-CM

## 2020-10-07 DIAGNOSIS — Z89511 Acquired absence of right leg below knee: Secondary | ICD-10-CM

## 2020-10-07 DIAGNOSIS — Z89512 Acquired absence of left leg below knee: Secondary | ICD-10-CM

## 2020-10-07 DIAGNOSIS — Z89029 Acquired absence of unspecified finger(s): Secondary | ICD-10-CM

## 2020-10-07 DIAGNOSIS — T8149XA Infection following a procedure, other surgical site, initial encounter: Secondary | ICD-10-CM

## 2020-10-07 DIAGNOSIS — D638 Anemia in other chronic diseases classified elsewhere: Secondary | ICD-10-CM

## 2020-10-07 DIAGNOSIS — G8918 Other acute postprocedural pain: Secondary | ICD-10-CM

## 2020-10-07 MED ORDER — CHLORHEXIDINE GLUCONATE CLOTH 2 % EX PADS
6.0000 | MEDICATED_PAD | Freq: Every day | CUTANEOUS | Status: DC
  Administered 2020-10-08 – 2020-10-15 (×4): 6 via TOPICAL

## 2020-10-07 NOTE — Progress Notes (Signed)
Physical Therapy Session Note  Patient Details  Name: [name] MRN: [id number] Date of Birth: [birth year]  Today's Date: 10/07/2020 PT Individual Time: 1400-1500 PT Individual Time Calculation (min): 60 min   Short Term Goals: Week 1:  PT Short Term Goal 1 (Week 1): pt will transfer stand<>pivot with LRAD CGA PT Short Term Goal 2 (Week 1): Pt will ambulate 76ft with LRAD CGA PT Short Term Goal 3 (Week 1): Pt will perform simulated car transfer with LRAD CGA Week 2:    Week 3:     Skilled Therapeutic Interventions/Progress Updates:   Pt seen this pm for session w/focus on sliding board transfers and instruction w/PWC.  Pt currently NWBing bilat LEs and limited thru intact digits bilat UEs.  Discussed trial w/PWC and pt agreeable, expressed interest in this option if wounds continue to limit use of exts for mobility.  Discussed energy conservation benefits. Therapist obtained pwc then instructed pt w/basic controls for propulsion and seating/positioning. Also educated pt on use of charger and need for nightly charging to ensure function of wc. Therapist demonstrated safe set up of wc and use of sliding board.  Pt performed bed to wc and returned wc to bed SBT w/mod assist of 1, level surface transfer.    Pt transported in wc to hallway and performed straight path propulsion using LUE in field goal hand control w/supervision and cues.  Ability to control path improved over practice trial of > 445ft, slowest setting x 286ft increased 1 level and continued to develop control on straight path.  Practiced turning/backing w/cues, wide birth turns.  Discussed process for ordering wc, accessibility in home/community, transport of wc.  Pt continues to express interest in persuing option of PWC as means of mobility.   Therapist obtained 20/20 foam cushion to improve seat depth and allow for improved pressure distribution and comfort/positioning.  Therapist also connected charger and again  demonstrated set up for charging w/pt.  Pt left supine w/rails up x 3, alarm set, bed in lowest position, and needs in reach.    Therapy Documentation Precautions:  Precautions Precautions: Fall Precaution Comments: bilateral prosthetics Restrictions Weight Bearing Restrictions: Yes Other Position/Activity Restrictions: MD and PA stated no weight bearing through hands   Therapy/Group: Individual Therapy  Callie Fielding, [place] 10/07/2020, 3:04 PM

## 2020-10-07 NOTE — Plan of Care (Signed)
  Problem: Consults Goal: RH GENERAL PATIENT EDUCATION Description: See Patient Education module for education specifics. Outcome: Progressing   Problem: RH SKIN INTEGRITY Goal: RH STG ABLE TO PERFORM INCISION/WOUND CARE W/ASSISTANCE Description: STG Able To Perform Incision/Wound Care With with mod I Assistance. Outcome: Progressing   Problem: RH PAIN MANAGEMENT Goal: RH STG PAIN MANAGED AT OR BELOW PT'S PAIN GOAL Description: Pain level less than 4 on scale of 0-10 Outcome: Progressing   Problem: RH KNOWLEDGE DEFICIT GENERAL Goal: RH STG INCREASE KNOWLEDGE OF SELF CARE AFTER HOSPITALIZATION Description: Pt will be able to demonstrate understanding of medication regimen and dietary modifications to prevent complications related to co-morbidities with mod I assist using handouts and booklet provided.  Outcome: Progressing

## 2020-10-07 NOTE — Progress Notes (Signed)
[facility] Progress Note   Subjective:   Transferred to CIR. Reports he felt weak after dialysis yesterday, thinks too much fluid was removed. Denies SOB, CP, palpitations, dizziness, nausea and vomiting.   Objective Vitals:   10/06/20 1700 10/06/20 1955 10/07/20 0633  BP: (!) 143/74 (!) 143/69 134/67  Pulse: 82 67 64  Resp: 15 18 12   Temp: 99.9 F (37.7 C) 97.9 F (36.6 C) 98 F (36.7 C)  TempSrc: Oral  Oral  SpO2: 100% 97%   Weight: 81.6 kg     Physical Exam General:Well developed, alert and in NAD Heart:RRR, no murmurs, rubs or gallops Lungs:CTA bilaterally without wheezing, rhonchi or rales Abdomen:Soft, non-tender, non-distended, +BS Extremities:B/l BKA, b/l hands wrapped, no peripheral edema Dialysis Access: LUE AVF + bruit  Additional Objective Labs: Basic Metabolic Panel: Recent Labs  Lab 10/03/20 0215 10/04/20 1553 10/06/20 0748  NA 137 135 133*  K 3.6 3.9 4.0  CL 96* 96* 96*  CO2 26 20* 21*  GLUCOSE 102* 118* 90  BUN 52* 70* 60*  CREATININE 9.54* 11.11* 9.27*  CALCIUM 8.2* 8.0* 8.6*  PHOS 9.5* 10.0* 8.6*   Liver Function Tests: Recent Labs  Lab 10/03/20 0215 10/04/20 1553 10/06/20 0748  ALBUMIN 2.6* 2.5* 2.6*   CBC: Recent Labs  Lab 09/30/20 1747 09/30/20 1747 10/01/20 0354 10/01/20 1047 10/02/20 0228 10/03/20 0215 10/03/20 1001 10/04/20 1553 10/06/20 0748  WBC 10.6*   < > 11.1*  --  9.6  --   --  9.2 9.6  NEUTROABS  --   --   --   --  6.8  --   --   --   --   HGB 8.1*   < > 8.3*   < > 8.8*   < > 7.8* 7.7* 7.8*  HCT 26.8*   < > 27.6*   < > 28.8*   < > 26.1* 25.3* 26.1*  MCV 75.5*  --  75.0*  --  76.0*  --   --  75.3* 74.6*  PLT 186   < > 162  --  173  --   --  193 192   < > = values in this interval not displayed.   Blood Culture    Component Value Date/Time   SDES WOUND 10/01/2020 1140   SPECREQUEST LEFT HAND SPEC A 10/01/2020 1140   CULT  10/01/2020 1140    FEW PROTEUS PENNERI FEW MORGANELLA MORGANII NO  ANAEROBES ISOLATED Performed at [facility], [place] [address] [place], [place] [address]    REPTSTATUS 10/06/2020 FINAL 10/01/2020 1140   CBG: Recent Labs  Lab 10/02/20 2059 10/03/20 0703 10/03/20 1204 10/03/20 2103 10/04/20 0645  GLUCAP 102* 89 96 92 108*   Medications:  . amLODipine  10 mg Oral Daily  . atorvastatin  20 mg Oral QHS  . [START ON 10/08/2020] calcitRIOL  1.8 mcg Intravenous Q M,W,F-HD  . carvedilol  12.5 mg Oral Q breakfast  . carvedilol  12.5 mg Oral Once per day on Sun Tue Thu Sat  . [START ON 10/11/2020] darbepoetin (ARANESP) injection - DIALYSIS  150 mcg Intravenous Q Mon-HD  . docusate sodium  100 mg Oral BID  . heparin  5,000 Units Subcutaneous Q8H  . hydrALAZINE  25 mg Oral Q8H  . [START ON 10/08/2020] multivitamin  1 tablet Oral Q M,W,F-HD  . pregabalin  100 mg Oral QHS  . sevelamer carbonate  3,200 mg Oral TID WC  . sulfamethoxazole-trimethoprim  1 tablet Oral  Daily    Dialysis Orders: GO MWF 4h 79min 200NRe 425/800 EDW 84kg 2K/2Ca L AVF No heparin  Venofer 100mg  IV x 10 (until 11/15) Mircera 150 q 2 weeks (last 10/15)  Calcitriol 1.75 TIW   Assessment/Plan: 1. PVD/L hand ischemia/infection. S/p recent amp L index finger.S/pOR washout on 10/29. Now in CIR 2. ESRD -HD MWF.K+ running on the low side, using high K+ bath. 3. Hypertension/volume - Blood pressureselevated but improved with UF. Have been running high as outpatient as well. EDW just lowered. On Coreg. Reported feeling weak after dialysis yesterday, smaller UF goal tomorrow and may need additional antihypertensive medication if BP remains above goal.  4. Anemia - Hgb7.8.givemaranesp 150 with HD 11/1.Iron studies are pending. 5. Metabolic bone disease -Calcium controlled.Phos is elevated, increasedrenvela dose 11/1 and improving. 6. DMT2 -per primary    Anice Paganini, PA-C 10/07/2020, 8:23 AM  [facility] Pager: [phone]

## 2020-10-07 NOTE — Progress Notes (Signed)
Inpatient [facility] Individual Statement of Services  Patient Name:  [name]  Date:  10/07/2020  Welcome to the [facility].  Our goal is to provide you with an individualized program based on your diagnosis and situation, designed to meet your specific needs.  With this comprehensive rehabilitation program, you will be expected to participate in at least 3 hours of rehabilitation therapies Monday-Friday, with modified therapy programming on the weekends.  Your rehabilitation program will include the following services:  Physical Therapy (PT), Occupational Therapy (OT), 24 hour per day rehabilitation nursing, Neuropsychology, Care Coordinator, Rehabilitation Medicine, Nutrition Services and Pharmacy Services  Weekly team conferences will be held on Wednesday to discuss your progress.  Your Inpatient Rehabilitation Care Coordinator will talk with you frequently to get your input and to update you on team discussions.  Team conferences with you and your family in attendance may also be held.  Expected length of stay: 10-12 days  Overall anticipated outcome: supervision to mod/i level  Depending on your progress and recovery, your program may change. Your Inpatient Rehabilitation Care Coordinator will coordinate services and will keep you informed of any changes. Your Inpatient Rehabilitation Care Coordinator's name and contact numbers are listed  below.  The following services may also be recommended but are not provided by the [facility]:    [place] will be made to provide these services after discharge if needed.  Arrangements include referral to agencies that provide these services.  Your insurance has been verified to be:  Medicare & Medicaid Your primary doctor is:  Carie Caddy  Pertinent information will be shared with your doctor and your  insurance company.  Inpatient Rehabilitation Care Coordinator:  Ovidio Kin, [place] or Emilia Beck  Information discussed with and copy given to patient by: Elease Hashimoto, 10/07/2020, 2:57 PM

## 2020-10-07 NOTE — Progress Notes (Signed)
Inpatient Rehabilitation  Patient information reviewed and entered into eRehab system by Ayshia Gramlich M. Caitlin Ainley, M.A., CCC/SLP, PPS Coordinator.  Information including medical coding, functional ability and quality indicators will be reviewed and updated through discharge.    
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
## 2020-10-07 NOTE — Progress Notes (Addendum)
[place] PHYSICAL MEDICINE & REHABILITATION PROGRESS NOTE  Subjective/Complaints: Patient seen sitting up in his bed this morning. He states he slept well overnight. He states he is ready to begin therapies. Discussed avoiding wearing bilateral lower extremity prosthesis with patient. He was seen by nephro, notes reviewed-iron studies pending  ROS: Denies CP, shortness of breath, nausea, vomiting, diarrhea.  Objective: Vital Signs: Blood pressure 134/67, pulse 64, temperature 98 F (36.7 C), temperature source Oral, resp. rate 12, weight 81.6 kg, SpO2 97 %. No results found. Recent Labs    10/04/20 1553 10/06/20 0748  WBC 9.2 9.6  HGB 7.7* 7.8*  HCT 25.3* 26.1*  PLT 193 192   Recent Labs    10/04/20 1553 10/06/20 0748  NA 135 133*  K 3.9 4.0  CL 96* 96*  CO2 20* 21*  GLUCOSE 118* 90  BUN 70* 60*  CREATININE 11.11* 9.27*  CALCIUM 8.0* 8.6*    Intake/Output Summary (Last 24 hours) at 10/07/2020 1251 Last data filed at 10/07/2020 0700 Gross per 24 hour  Intake 180 ml  Output --  Net 180 ml        Physical Exam: BP 134/67 (BP Location: Right Arm)   Pulse 64   Temp 98 F (36.7 C) (Oral)   Resp 12   Wt 81.6 kg   SpO2 97%   BMI 21.90 kg/m  Constitutional: No distress . Vital signs reviewed. HENT: Normocephalic.  Atraumatic. Eyes: EOMI. No discharge. Cardiovascular: No JVD.  RRR. Respiratory: Normal effort.  No stridor.  Bilateral clear to auscultation. GI: Non-distended.  BS +. Skin: Warm and dry. Dressing to bilateral hands CDI. Right knee with patella and right lateral tibial ulcer Left distal stump with lesion  Dry gangrene RUE 5th digit Psych: Normal mood.  Normal behavior. Musc: Bilateral hands with edema and tenderness. RUE: Amputation of digits 2-4 LUE: Amputation of 2nd digit  Neuro: Alert Motor: Makes eye contact with examiner.  Follows commands. Motor: Bilateral upper extremities: Proximally 5/5, distally limited due to pain Bilateral  lower extremities: Hip flexion, knee extension 5/5, unchanged   Assessment/Plan: 1. Functional deficits secondary to PVD s/p amputation of digits in bilateral hands with history of bilateral BKA which require 3+ hours per day of interdisciplinary therapy in a comprehensive inpatient rehab setting.  Physiatrist is providing close team supervision and 24 hour management of active medical problems listed below.  Physiatrist and rehab team continue to assess barriers to discharge/monitor patient progress toward functional and medical goals   Care Tool:  Bathing              Bathing assist       Upper Body Dressing/Undressing Upper body dressing Upper body dressing/undressing activity did not occur (including orthotics): N/A      Upper body assist      Lower Body Dressing/Undressing Lower body dressing    Lower body dressing activity did not occur: N/A       Lower body assist       Toileting Toileting Toileting Activity did not occur (Clothing management and hygiene only): N/A (no void or bm)  Toileting assist       Transfers Chair/bed transfer  Transfers assist     Chair/bed transfer assist level: Moderate Assistance - Patient 50 - 74% (pulling with elbows and intact digits)     Locomotion Ambulation   Ambulation assist   Ambulation activity did not occur: Safety/medical concerns (per MD/PA no standing on prosthetics until wound care comes to assess  pt)          Walk 10 feet activity   Assist  Walk 10 feet activity did not occur: Safety/medical concerns (per MD/PA no standing on prosthetics until wound care comes to assess pt)        Walk 50 feet activity   Assist Walk 50 feet with 2 turns activity did not occur: Safety/medical concerns (per MD/PA no standing on prosthetics until wound care comes to assess pt)         Walk 150 feet activity   Assist Walk 150 feet activity did not occur: Safety/medical concerns (per MD/PA no standing on  prosthetics until wound care comes to assess pt)         Walk 10 feet on uneven surface  activity   Assist Walk 10 feet on uneven surfaces activity did not occur: Safety/medical concerns (per MD/PA no standing on prosthetics until wound care comes to assess pt)         Wheelchair     Assist Will patient use wheelchair at discharge?: Yes Type of Wheelchair: Manual Wheelchair activity did not occur: Safety/medical concerns (not able to bear weight through hands and did not don R prosthetic due to wound)         Wheelchair 50 feet with 2 turns activity    Assist    Wheelchair 50 feet with 2 turns activity did not occur: Safety/medical concerns (not able to bear weight through hands and did not don R prosthetic due to wound)       Wheelchair 150 feet activity     Assist  Wheelchair 150 feet activity did not occur: Safety/medical concerns (not able to bear weight through hands and did not don R prosthetic due to wound)        Medical Problem List and Plan: 1.  Decreased functional mobility with failure to thrive secondary to PVD with history of bilateral BKA as well as status post amputation of multiple fingers critical limb ischemia.  Status post I&D of both hands 10/01/2020 per Dr. Burney Gauze. Orthopedic services advises to minimize weightbearing through hand as much as possible.  Begin CIR evaluation 2.  Antithrombotics: -DVT/anticoagulation: Subcutaneous heparin             -antiplatelet therapy: N/A 3. Pain Management: Lyrica 100 mg nightly, oxycodone as needed             Monitor with increased exertion 4. Mood: Provide emotional support             -antipsychotic agents: N/A 5. Neuropsych: This patient is capable of making decisions on his own behalf. 6. Skin/Wound Care: Routine skin checks with wound care nurse follow-up  Discussed with [employer]-lower extremity prosthesis not to be worn until adjustments made due to worsening ulcers 7.  Fluids/Electrolytes/Nutrition: Routine and outs 8.  ID.  Wound culture left hand Proteus penneri.  Continue Bactrim for 2 more weeks from 10/04/2020.  Follow-up outpatient infectious disease clinic 9. ESRD.  Continue hemodialysis as directed             Recs per nephro 10.  Anemia of chronic disease.  Continue Aranesp.               Hemoglobin 7.8 on 11/3, labs with HD 11.  Hypertension.  Norvasc 10 mg daily, Coreg as directed, hydralazine 25 mg every 8 hours.               Monitor with increased mobility 12.  Hyperlipidemia: Lipitor  LOS:  1 days A FACE TO FACE EVALUATION WAS PERFORMED  [name] [name] 10/07/2020, 12:51 PM

## 2020-10-07 NOTE — Consult Note (Signed)
WOC Nurse Consult Note: Patient receiving care in [facility] 4M11.  I spoke with his primary nightshift nurse, [place] via telephone. Reason for Consult: "Follow-up for wound care bilateral BKA and hand wound" I personally saw the patient 11/1 and entered orders for wound care to these areas.  The orders are currently in the record.  No further intervention needed at this time. Monitor the wound area(s) for worsening of condition such as: Signs/symptoms of infection,  Increase in size,  Development of or worsening of odor, Development of pain, or increased pain at the affected locations.  Notify the medical team if any of these develop.  Thank you for the consult.  Discussed plan of care with the bedside nurse.  [facility] nurse will not follow at this time.  Please re-consult the [facility] team if needed.  Val Riles, RN, MSN, CWOCN, CNS-BC, pager [phone]

## 2020-10-07 NOTE — Progress Notes (Signed)
After F/U spiritual care with the Pt., the chaplain understands the Pt. "surrogate" healthcare decision maker is his wife.  The Pt. declined completing an Advance Directive.  F/U spiritual care was offered to the Pt. with directions on how to request a chaplain.

## 2020-10-07 NOTE — Progress Notes (Signed)
Orthopedic Tech Progress Note Patient Details:  [name] 11/03/1961 [id number] Called [facility] to come see patient  Patient ID: [name], male   DOB: 08-09-1961, 59 y.o.   MRN: [id number]   Ellouise Newer 10/07/2020, 8:32 AM
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
## 2020-10-08 ENCOUNTER — Inpatient Hospital Stay (HOSPITAL_COMMUNITY): Payer: Medicare Other | Admitting: Occupational Therapy

## 2020-10-08 ENCOUNTER — Inpatient Hospital Stay (HOSPITAL_COMMUNITY): Payer: Medicare Other

## 2020-10-08 ENCOUNTER — Inpatient Hospital Stay (HOSPITAL_COMMUNITY): Payer: Medicare Other | Admitting: Physical Therapy

## 2020-10-08 LAB — CBC
HCT: 25.9 % — ABNORMAL LOW (ref 39.0–52.0)
Hemoglobin: 7.8 g/dL — ABNORMAL LOW (ref 13.0–17.0)
MCH: 22.6 pg — ABNORMAL LOW (ref 26.0–34.0)
MCHC: 30.1 g/dL (ref 30.0–36.0)
MCV: 75.1 fL — ABNORMAL LOW (ref 80.0–100.0)
Platelets: 224 10*3/uL (ref 150–400)
RBC: 3.45 MIL/uL — ABNORMAL LOW (ref 4.22–5.81)
RDW: 18.5 % — ABNORMAL HIGH (ref 11.5–15.5)
WBC: 9.9 10*3/uL (ref 4.0–10.5)
nRBC: 0 % (ref 0.0–0.2)

## 2020-10-08 LAB — RENAL FUNCTION PANEL
Albumin: 2.7 g/dL — ABNORMAL LOW (ref 3.5–5.0)
Anion gap: 16 — ABNORMAL HIGH (ref 5–15)
BUN: 60 mg/dL — ABNORMAL HIGH (ref 8–23)
CO2: 24 mmol/L (ref 22–32)
Calcium: 8.9 mg/dL (ref 8.9–10.3)
Chloride: 93 mmol/L — ABNORMAL LOW (ref 98–111)
Creatinine, Ser: 9.85 mg/dL — ABNORMAL HIGH (ref 0.61–1.24)
GFR, Estimated: 6 mL/min — ABNORMAL LOW (ref >60)
Glucose, Bld: 102 mg/dL — ABNORMAL HIGH (ref 70–99)
Phosphorus: 7.3 mg/dL — ABNORMAL HIGH (ref 2.5–4.6)
Potassium: 4.2 mmol/L (ref 3.5–5.1)
Sodium: 133 mmol/L — ABNORMAL LOW (ref 135–145)

## 2020-10-08 MED ORDER — ACETAMINOPHEN 325 MG PO TABS
650.0000 mg | ORAL_TABLET | Freq: Three times a day (TID) | ORAL | Status: DC
  Administered 2020-10-08 – 2020-10-16 (×19): 650 mg via ORAL
  Filled 2020-10-08 (×21): qty 2

## 2020-10-08 MED ORDER — OXYCODONE HCL 5 MG PO TABS
ORAL_TABLET | ORAL | Status: AC
  Administered 2020-10-08: 15 mg via ORAL
  Filled 2020-10-08: qty 3

## 2020-10-08 MED ORDER — ACETAMINOPHEN 650 MG RE SUPP
650.0000 mg | Freq: Three times a day (TID) | RECTAL | Status: DC
  Filled 2020-10-08 (×4): qty 1

## 2020-10-08 MED ORDER — CALCITRIOL 1 MCG/ML IV SOLN
INTRAVENOUS | Status: AC
  Administered 2020-10-08: 1.8 ug via INTRAVENOUS
  Filled 2020-10-08: qty 2

## 2020-10-08 NOTE — Plan of Care (Signed)
°  Problem: Consults Goal: RH GENERAL PATIENT EDUCATION Description: See Patient Education module for education specifics. Outcome: Progressing   Problem: RH SKIN INTEGRITY Goal: RH STG ABLE TO PERFORM INCISION/WOUND CARE W/ASSISTANCE Description: STG Able To Perform Incision/Wound Care With with mod I Assistance. Outcome: Progressing   Problem: RH PAIN MANAGEMENT Goal: RH STG PAIN MANAGED AT OR BELOW PT'S PAIN GOAL Description: Pain level less than 4 on scale of 0-10 Outcome: Progressing   Problem: RH KNOWLEDGE DEFICIT GENERAL Goal: RH STG INCREASE KNOWLEDGE OF SELF CARE AFTER HOSPITALIZATION Description: Pt will be able to demonstrate understanding of medication regimen and dietary modifications to prevent complications related to co-morbidities with mod I assist using handouts and booklet provided.  Outcome: Progressing

## 2020-10-08 NOTE — Progress Notes (Signed)
Occupational Therapy Session Note  Patient Details  Name: [name] MRN: [id number] Date of Birth: 12/17/1960  Today's Date: 10/08/2020 OT Individual Time: 5732-2025 OT Individual Time Calculation (min): 55 min    Short Term Goals: Week 1:  OT Short Term Goal 1 (Week 1): STGs equal to LTGs set at supervison level overall.  Skilled Therapeutic Interventions/Progress Updates:    Treatment session with focus on functional mobility, transfers, and care for residual limbs. Pt received seated EOB reporting already bathed and dressed. Engaged in discussion about MD concerns regarding prosthesis wear due to varying degree of healing ulcers on BLE.  Completed slide board transfer bed > power w/c with mod assist to decrease weight bearing through [place].  Of note, pt utilizing elbows when possible for weight shifting and hooking elbow around arm rest to assist in scoot along board, however still placing ~25% weight through hands during transfer.  Therapist educated pt on various seating positions in power w/c to increase ability to reach items as well as for pressure relief and comfort.  Pt educated on speed settings and power on/off.  Pt maneuvered out of room and around obstacles in hallway with min cues on slow speed.  Encouraged pt to attempt indoor setting on straightaways, completing turns through doorways and ultimately navigating in ADL apt to navigate tight spaces.  Pt returned to room and remained upright in w/c with seat belt on and all needs in reach.  Therapy Documentation Precautions:  Precautions Precautions: Fall Precaution Comments: bilateral prosthetics Restrictions Weight Bearing Restrictions: No Other Position/Activity Restrictions: MD and PA stated no weight bearing through hands but no formal order written General:   Vital Signs: Therapy Vitals Temp: 98.5 F (36.9 C) Temp Source: Oral Pulse Rate: 65 BP: (!) 163/77 Patient Position (if appropriate): Lying Oxygen  Therapy SpO2:  (Probe does not read on fingers) O2 Device: Room Air Pain: Pain Assessment Pain Scale: 0-10 Pain Score: 9  Pain Type: Surgical pain Pain Location: Hand Pain Orientation: Right;Left Pain Descriptors / Indicators: Throbbing Pain Frequency: Constant Pain Onset: On-going Patients Stated Pain Goal: 3 Pain Intervention(s): Medication (See eMAR)   Therapy/Group: Individual Therapy  Simonne Come 10/08/2020, 9:06 AM

## 2020-10-08 NOTE — Progress Notes (Signed)
Physical Therapy Session Note  Patient Details  Name: [name] MRN: [id number] Date of Birth: May 08, 1961  Today's Date: 10/08/2020 PT Individual Time: 2229-7989 PT Individual Time Calculation (min): 60 min   Short Term Goals: Week 1:  PT Short Term Goal 1 (Week 1): pt will transfer stand<>pivot with LRAD CGA PT Short Term Goal 2 (Week 1): Pt will ambulate 6ft with LRAD CGA PT Short Term Goal 3 (Week 1): Pt will perform simulated car transfer with LRAD CGA Week 2:     Skilled Therapeutic Interventions/Progress Updates:    PAIN denies pain  Pt seen this am for session w/focus on: Transfers, wc skills, core strengthening, coordination of PWC consult. Per MD note, pt to continue  not using prosthesis due to wounds. Pt initially oob in wc and agreeable to session. Worked on the following wc skills: Operation of controls, changing speed settings, changing from seating to speed settings, changing position of parts using power controls, charging wc.  Navigation: Forward propulsion in hall at various speeds w/supervision, min assist/cues for changing speeds Ramp:  Changing legrest position in prep requires mod cues, practiced ramp mult times w/cues for safety/awareness of post caster position w/turning. Obstacle course - weaving around cones placed at various space intervals to simulate household navigation, multiple passes w/improving control w/practice Backing thru obstacle course - very difficult for pt to control, will benefit from continued practice, discussed importance of backing skill as relates to household use/positioning w/transfers, tight areas such as in BR.  wc to bed ant transfer w/cues for set up, min assist for set up, cga and cues for safety. Pt repositions in bed w/supervision.  Requested to remain longsitting in bed w/tray at side so he can sit to eat lunch.  Pt left seated in bed w/rails up x 3, alarm set, bed in lowest position, and needs in reach.   Contacted  [name] at [facility] [employer] and rehab scheduler and set up wc consult for 11/8 at 11:00am.  Discussed w/Social Worker Jacqlyn Larsen [place] who will provide necessary info to [facility].   Therapy Documentation Precautions:  Precautions Precautions: Fall Precaution Comments: bilateral prosthetics Restrictions Weight Bearing Restrictions: No Other Position/Activity Restrictions: MD and PA stated no weight bearing through hands but no formal order written   Therapy/Group: Individual Therapy  Callie Fielding, [place] 10/08/2020, 12:18 PM

## 2020-10-08 NOTE — Progress Notes (Signed)
Patient ID: [name], male   DOB: 1961/03/04, 59 y.o.   MRN: [id number] Spoke with barbara-PT who has scheduled a power chair evaluation with [facility] for Monday @ 11:00. Have faxed over demographics and H & P in preparation for eval Monday. Pt on board with this and when asked about the hoveraround he did not have information regarding this.

## 2020-10-08 NOTE — Progress Notes (Signed)
Physical Therapy Session Note  Patient Details  Name: [name] MRN: [id number] Date of Birth: 10/09/1961  Today's Date: 10/08/2020 PT Individual Time: 0940-1020 PT Individual Time Calculation (min): 40 min   Short Term Goals: Week 1:  PT Short Term Goal 1 (Week 1): pt will transfer stand<>pivot with LRAD CGA PT Short Term Goal 2 (Week 1): Pt will ambulate 25ft with LRAD CGA PT Short Term Goal 3 (Week 1): Pt will perform simulated car transfer with LRAD CGA  Skilled Therapeutic Interventions/Progress Updates:   Pt received sitting in WC and agreeable to PT. PT instructed pt in Power WC mobility through rehab unit x 200ft, 150ft, and 300ftx 2 with supervision assist for safety and doorway management.   PT also instructed pt in A/P transfers to and from mat table x 3 each direction. Min-moderate cues for improved reciprocal scoot technique and decreased use of distal residual limb and Bil hands to maintain proper NWB status through all 4 distal extremities. Pt also performed lateral scoot to reposition into center of mat table with min cues for improved core activation prevent WB through distal BLE.   Patient returned to room and left sitting in WC with call bell in reach and all needs met.        Therapy Documentation Precautions:  Precautions Precautions: Fall Precaution Comments: bilateral prosthetics Restrictions Weight Bearing Restrictions: No Other Position/Activity Restrictions: MD and PA stated no weight bearing through hands but no formal order written    Vital Signs: Therapy Vitals Temp: 98.4 F (36.9 C) Pulse Rate: 69 Resp: 16 BP: (!) 144/70 Patient Position (if appropriate): Lying Oxygen Therapy O2 Device: Room Air Pain: Pain Assessment Pain Scale: 0-10 Pain Score: 7  Pain Type: Surgical pain Pain Location: Hand Pain Orientation: Right;Left Pain Descriptors / Indicators: Throbbing Pain Frequency: Constant Pain Onset: On-going Patients Stated Pain  Goal: 3 Pain Intervention(s): Medication (See eMAR) Multiple Pain Sites: No   Therapy/Group: Individual Therapy   E  10/08/2020, 1:59 PM  

## 2020-10-08 NOTE — Progress Notes (Signed)
[facility] Progress Note   Subjective:   Seen on HD. Feeling well, no concerns. Denies CP, palpitations, SOB, dizziness.   Objective Vitals:   10/07/20 0633 10/07/20 1413 10/07/20 1917 10/08/20 0511  BP: 134/67 136/72 113/65 (!) 163/77  Pulse: 64 65 69 65  Resp: 12 14 18    Temp: 98 F (36.7 C) 98.4 F (36.9 C) 99.5 F (37.5 C) 98.5 F (36.9 C)  TempSrc: Oral   Oral  SpO2:      Weight:       Physical Exam General:Well developed, alert and in NAD Heart:RRR, no murmurs, rubs or gallops Lungs:CTA bilaterally without wheezing, rhonchi or rales Abdomen:Soft, non-tender, non-distended, +BS Extremities:B/l BKA, b/l hands wrapped, no peripheral edema Dialysis Access:LUE AVF + bruit  Additional Objective Labs: Basic Metabolic Panel: Recent Labs  Lab 10/03/20 0215 10/04/20 1553 10/06/20 0748  NA 137 135 133*  K 3.6 3.9 4.0  CL 96* 96* 96*  CO2 26 20* 21*  GLUCOSE 102* 118* 90  BUN 52* 70* 60*  CREATININE 9.54* 11.11* 9.27*  CALCIUM 8.2* 8.0* 8.6*  PHOS 9.5* 10.0* 8.6*   Liver Function Tests: Recent Labs  Lab 10/03/20 0215 10/04/20 1553 10/06/20 0748  ALBUMIN 2.6* 2.5* 2.6*   CBC: Recent Labs  Lab 10/02/20 0228 10/03/20 0215 10/03/20 1001 10/04/20 1553 10/06/20 0748  WBC 9.6  --   --  9.2 9.6  NEUTROABS 6.8  --   --   --   --   HGB 8.8*   < > 7.8* 7.7* 7.8*  HCT 28.8*   < > 26.1* 25.3* 26.1*  MCV 76.0*  --   --  75.3* 74.6*  PLT 173  --   --  193 192   < > = values in this interval not displayed.   Blood Culture    Component Value Date/Time   SDES WOUND 10/01/2020 1140   SPECREQUEST LEFT HAND SPEC A 10/01/2020 1140   CULT  10/01/2020 1140    FEW PROTEUS PENNERI FEW MORGANELLA MORGANII NO ANAEROBES ISOLATED Performed at [facility], [place] [address], [place], [place] [address]    REPTSTATUS 10/06/2020 FINAL 10/01/2020 1140    CBG: Recent Labs  Lab 10/02/20 2059 10/03/20 0703 10/03/20 1204 10/03/20 2103  10/04/20 0645  GLUCAP 102* 89 96 92 108*   Medications:  . acetaminophen  650 mg Oral TID   Or  . acetaminophen  650 mg Rectal TID  . amLODipine  10 mg Oral Daily  . atorvastatin  20 mg Oral QHS  . calcitRIOL  1.8 mcg Intravenous Q M,W,F-HD  . carvedilol  12.5 mg Oral Q breakfast  . carvedilol  12.5 mg Oral Once per day on Sun Tue Thu Sat  . Chlorhexidine Gluconate Cloth  6 each Topical Q0600  . [START ON 10/11/2020] darbepoetin (ARANESP) injection - DIALYSIS  150 mcg Intravenous Q Mon-HD  . docusate sodium  100 mg Oral BID  . heparin  5,000 Units Subcutaneous Q8H  . hydrALAZINE  25 mg Oral Q8H  . multivitamin  1 tablet Oral Q M,W,F-HD  . pregabalin  100 mg Oral QHS  . sevelamer carbonate  3,200 mg Oral TID WC  . sulfamethoxazole-trimethoprim  1 tablet Oral Daily    Dialysis Orders: GO MWF 4h 80min 200NRe 425/800 EDW 84kg 2K/2Ca L AVF No heparin  Venofer 100mg  IV x 10 (until 11/15) Mircera 150 q 2 weeks (last 10/15)  Calcitriol 1.75 TIW  Assessment/Plan: 1. PVD/L hand ischemia/infection. S/p  recent amp L index finger.S/pOR washout on 10/29. Now in CIR 2. ESRD -HD MWF.K+ running on the low side, using high K+ bath. 3. Hypertension/volume - Blood pressureselevatedbut improved with UF. Have been running high as outpatient as well. EDW just lowered. On Coreg. Reported feeling weak after dialysis yesterday, smaller UF goal tomorrow and may need additional antihypertensive medication if BP remains above goal.  4. Anemia - Hgb7.8.givenaranesp 150 with HD 11/1.Iron studies are pending. 5. Metabolic bone disease -Calcium controlled.Phos is elevated, increasedrenvela dose 11/1 and improving. Rechecking labs with HD today.  6. DMT2 -per primary  Anice Paganini, PA-C 10/08/2020, 12:01 PM  [facility] Pager: [phone]

## 2020-10-08 NOTE — Progress Notes (Signed)
[name] PHYSICAL MEDICINE & REHABILITATION PROGRESS NOTE  Subjective/Complaints: Complains of pain at finger amputation site. Oxycodone helps but does not last until next dose, has 15mg  scheduled q4H prn. Discussed scheduling Tylenol during the day and he is agreeable. Discussed that BP is elevated this morning  ROS: Denies CP, shortness of breath, nausea, vomiting, diarrhea.  Objective: Vital Signs: Blood pressure (!) 163/77, pulse 65, temperature 98.5 F (36.9 C), temperature source Oral, resp. rate 18, weight 81.6 kg, SpO2 97 %. No results found. Recent Labs    10/06/20 0748  WBC 9.6  HGB 7.8*  HCT 26.1*  PLT 192   Recent Labs    10/06/20 0748  NA 133*  K 4.0  CL 96*  CO2 21*  GLUCOSE 90  BUN 60*  CREATININE 9.27*  CALCIUM 8.6*    Intake/Output Summary (Last 24 hours) at 10/08/2020 0908 Last data filed at 10/07/2020 2100 Gross per 24 hour  Intake 520 ml  Output --  Net 520 ml        Physical Exam: BP (!) 163/77 (BP Location: Right Arm)    Pulse 65    Temp 98.5 F (36.9 C) (Oral)    Resp 18    Wt 81.6 kg    SpO2 97%    BMI 21.90 kg/m  General: Alert and oriented x 3, No apparent distress HEENT: Head is normocephalic, atraumatic, PERRLA, EOMI, sclera anicteric, oral mucosa pink and moist, dentition intact, ext ear canals clear,  Neck: Supple without JVD or lymphadenopathy Heart: Reg rate and rhythm. No murmurs rubs or gallops Chest: CTA bilaterally without wheezes, rales, or rhonchi; no distress Abdomen: Soft, non-tender, non-distended, bowel sounds positive. Extremities: No clubbing, cyanosis, or edema. Pulses are 2+ Skin: Warm and dry. Dressing to bilateral hands CDI. Right knee with patella and right lateral tibial ulcer Left distal stump with lesion  Dry gangrene RUE 5th digit Psych: Normal mood.  Normal behavior. Musc: Bilateral hands with edema and tenderness. RUE: Amputation of digits 2-4 LUE: Amputation of 2nd digit  Neuro:  Alert Motor: Makes eye contact with examiner.  Follows commands. Motor: Bilateral upper extremities: Proximally 5/5, distally limited due to pain Bilateral lower extremities: Hip flexion, knee extension 5/5, unchanged   Assessment/Plan: 1. Functional deficits secondary to PVD s/p amputation of digits in bilateral hands with history of bilateral BKA which require 3+ hours per day of interdisciplinary therapy in a comprehensive inpatient rehab setting.  Physiatrist is providing close team supervision and 24 hour management of active medical problems listed below.  Physiatrist and rehab team continue to assess barriers to discharge/monitor patient progress toward functional and medical goals   Care Tool:  Bathing  Bathing activity did not occur: Refused Body parts bathed by patient: Right arm, Left arm, Chest, Abdomen, Front perineal area, Right upper leg, Left upper leg, Face, Left lower leg, Right lower leg         Bathing assist Assist Level: Minimal Assistance - Patient > 75%     Upper Body Dressing/Undressing Upper body dressing Upper body dressing/undressing activity did not occur (including orthotics): N/A What is the patient wearing?: Pull over shirt    Upper body assist Assist Level: Set up assist    Lower Body Dressing/Undressing Lower body dressing    Lower body dressing activity did not occur: N/A What is the patient wearing?: Pants     Lower body assist Assist for lower body dressing: Moderate Assistance - Patient 50 - 74%     Toileting  Toileting Toileting Activity did not occur Landscape architect and hygiene only): N/A (no void or bm)  Toileting assist Assist for toileting: Supervision/Verbal cueing     Transfers Chair/bed transfer  Transfers assist     Chair/bed transfer assist level: Moderate Assistance - Patient 50 - 74% (slideboard transfer)     Locomotion Ambulation   Ambulation assist   Ambulation activity did not occur:  Safety/medical concerns (per MD/PA no standing on prosthetics until wound care comes to assess pt)  Assist level: Minimal Assistance - Patient > 75% Assistive device: Walker-platform Max distance: 10'   Walk 10 feet activity   Assist  Walk 10 feet activity did not occur: Safety/medical concerns (per MD/PA no standing on prosthetics until wound care comes to assess pt)        Walk 50 feet activity   Assist Walk 50 feet with 2 turns activity did not occur: Safety/medical concerns (per MD/PA no standing on prosthetics until wound care comes to assess pt)         Walk 150 feet activity   Assist Walk 150 feet activity did not occur: Safety/medical concerns (per MD/PA no standing on prosthetics until wound care comes to assess pt)         Walk 10 feet on uneven surface  activity   Assist Walk 10 feet on uneven surfaces activity did not occur: Safety/medical concerns (per MD/PA no standing on prosthetics until wound care comes to assess pt)         Wheelchair     Assist Will patient use wheelchair at discharge?: Yes Type of Wheelchair: Power (to be determined based on wbing/wound healing) Wheelchair activity did not occur: Safety/medical concerns (not able to bear weight through hands and did not don R prosthetic due to wound)  Wheelchair assist level: Moderate Assistance - Patient 50 - 74% Max wheelchair distance: 400    Wheelchair 50 feet with 2 turns activity    Assist    Wheelchair 50 feet with 2 turns activity did not occur: Safety/medical concerns (not able to bear weight through hands and did not don R prosthetic due to wound)   Assist Level: Moderate Assistance - Patient 50 - 74%   Wheelchair 150 feet activity     Assist  Wheelchair 150 feet activity did not occur: Safety/medical concerns (not able to bear weight through hands and did not don R prosthetic due to wound)   Assist Level: Moderate Assistance - Patient 50 - 74%    Medical  Problem List and Plan: 1.  Decreased functional mobility with failure to thrive secondary to PVD with history of bilateral BKA as well as status post amputation of multiple fingers critical limb ischemia.  Status post I&D of both hands 10/01/2020 per Dr. Burney Gauze. Orthopedic services advises to minimize weightbearing through hand as much as possible.  Continue CIR 2.  Antithrombotics: -DVT/anticoagulation: Subcutaneous heparin 5000U q8H              -antiplatelet therapy: N/A 3. Pain Management: Lyrica 100 mg nightly, oxycodone as needed             Monitor with increased exertion  Add Tylenol TID  4. Mood: Provide emotional support             -antipsychotic agents: N/A 5. Neuropsych: This patient is capable of making decisions on his own behalf. 6. Skin/Wound Care: Routine skin checks with wound care nurse follow-up  Discussed with [employer]-lower extremity prosthesis not to be worn until adjustments  made due to worsening ulcers 7. Fluids/Electrolytes/Nutrition: Routine and outs 8.  ID.  Wound culture left hand Proteus penneri.  Continue Bactrim for 2 more weeks from 10/04/2020.  Follow-up outpatient infectious disease clinic 9. ESRD.  Continue hemodialysis as directed             Recs per nephro 10.  Anemia of chronic disease.  Continue Aranesp.               Hemoglobin 7.8 on 11/3, labs with HD 11.  Hypertension.  Norvasc 10 mg daily, Coreg as directed, hydralazine 25 mg every 8 hours.               Elevated 1/5 12.  Hyperlipidemia: Lipitor  LOS: 2 days A FACE TO FACE EVALUATION WAS PERFORMED  [name] 10/08/2020, 9:08 AM

## 2020-10-09 LAB — FERRITIN: Ferritin: 1087 ng/mL — ABNORMAL HIGH (ref 24–336)

## 2020-10-09 LAB — IRON AND TIBC
Iron: 48 ug/dL (ref 45–182)
Saturation Ratios: 26 % (ref 17.9–39.5)
TIBC: 182 ug/dL — ABNORMAL LOW (ref 250–450)
UIBC: 134 ug/dL

## 2020-10-09 MED ORDER — MELATONIN 3 MG PO TABS
3.0000 mg | ORAL_TABLET | Freq: Every day | ORAL | Status: DC
  Administered 2020-10-09 – 2020-10-15 (×7): 3 mg via ORAL
  Filled 2020-10-09 (×7): qty 1

## 2020-10-09 MED ORDER — HYDRALAZINE HCL 50 MG PO TABS
50.0000 mg | ORAL_TABLET | Freq: Three times a day (TID) | ORAL | Status: DC
  Administered 2020-10-09 – 2020-10-15 (×15): 50 mg via ORAL
  Filled 2020-10-09 (×16): qty 1

## 2020-10-09 MED ORDER — SODIUM CHLORIDE 0.9 % IV SOLN
100.0000 mg | INTRAVENOUS | Status: DC
  Administered 2020-10-13 – 2020-10-15 (×2): 100 mg via INTRAVENOUS
  Filled 2020-10-09 (×6): qty 5

## 2020-10-09 MED ORDER — DARBEPOETIN ALFA 200 MCG/0.4ML IJ SOSY
200.0000 ug | PREFILLED_SYRINGE | INTRAMUSCULAR | Status: DC
  Filled 2020-10-09: qty 0.4

## 2020-10-09 MED ORDER — DULOXETINE HCL 20 MG PO CPEP
20.0000 mg | ORAL_CAPSULE | Freq: Every day | ORAL | Status: DC
  Administered 2020-10-09 – 2020-10-10 (×2): 20 mg via ORAL
  Filled 2020-10-09 (×2): qty 1

## 2020-10-09 NOTE — Progress Notes (Signed)
[facility] PHYSICAL MEDICINE & REHABILITATION PROGRESS NOTE  Subjective/Complaints: C/o pain at amputation sites Vitals well controlled Sleeping poorly Moving bowels  ROS: Denies CP, shortness of breath, nausea, vomiting, diarrhea.  Objective: Vital Signs: Blood pressure 130/84, pulse 79, temperature 98.6 F (37 C), resp. rate 16, height 6\' 4"  (1.93 m), weight 83.5 kg, SpO2 100 %. No results found. Recent Labs    10/08/20 1433  WBC 9.9  HGB 7.8*  HCT 25.9*  PLT 224   Recent Labs    10/08/20 1433  NA 133*  K 4.2  CL 93*  CO2 24  GLUCOSE 102*  BUN 60*  CREATININE 9.85*  CALCIUM 8.9    Intake/Output Summary (Last 24 hours) at 10/09/2020 1348 Last data filed at 10/09/2020 0700 Gross per 24 hour  Intake 120 ml  Output 1000 ml  Net -880 ml        Physical Exam: BP 130/84   Pulse 79   Temp 98.6 F (37 C)   Resp 16   Ht 6\' 4"  (1.93 m)   Wt 83.5 kg   SpO2 100%   BMI 22.41 kg/m   General: Alert and oriented x 3, No apparent distress HEENT: Head is normocephalic, atraumatic, PERRLA, EOMI, sclera anicteric, oral mucosa pink and moist, dentition intact, ext ear canals clear,  Neck: Supple without JVD or lymphadenopathy Heart: Reg rate and rhythm. No murmurs rubs or gallops Chest: CTA bilaterally without wheezes, rales, or rhonchi; no distress Abdomen: Soft, non-tender, non-distended, bowel sounds positive. Extremities: No clubbing, cyanosis, or edema. Pulses are 2+ Skin: Warm and dry. Dressing to bilateral hands CDI. Right knee with patella and right lateral tibial ulcer Left distal stump with lesion  Dry gangrene RUE 5th digit Psych: Normal mood.  Normal behavior. Musc: Bilateral hands with edema and tenderness. RUE: Amputation of digits 2-4 LUE: Amputation of 2nd digit  Neuro: Alert Motor: Makes eye contact with examiner.  Follows commands. Motor: Bilateral upper extremities: Proximally 5/5, distally limited due to pain Bilateral lower extremities:  Hip flexion, knee extension 5/5, unchanged   Assessment/Plan: 1. Functional deficits secondary to PVD s/p amputation of digits in bilateral hands with history of bilateral BKA which require 3+ hours per day of interdisciplinary therapy in a comprehensive inpatient rehab setting.  Physiatrist is providing close team supervision and 24 hour management of active medical problems listed below.  Physiatrist and rehab team continue to assess barriers to discharge/monitor patient progress toward functional and medical goals   Care Tool:  Bathing  Bathing activity did not occur: Refused Body parts bathed by patient: Right arm, Left arm, Chest, Abdomen, Front perineal area, Right upper leg, Left upper leg, Face, Left lower leg, Right lower leg         Bathing assist Assist Level: Minimal Assistance - Patient > 75%     Upper Body Dressing/Undressing Upper body dressing Upper body dressing/undressing activity did not occur (including orthotics): N/A What is the patient wearing?: Pull over shirt    Upper body assist Assist Level: Set up assist    Lower Body Dressing/Undressing Lower body dressing    Lower body dressing activity did not occur: N/A What is the patient wearing?: Pants     Lower body assist Assist for lower body dressing: Moderate Assistance - Patient 50 - 74%     Toileting Toileting Toileting Activity did not occur (Clothing management and hygiene only): N/A (no void or bm)  Toileting assist Assist for toileting: Supervision/Verbal cueing  Transfers Chair/bed transfer  Transfers assist     Chair/bed transfer assist level: Minimal Assistance - Patient > 75%     Locomotion Ambulation   Ambulation assist   Ambulation activity did not occur: Safety/medical concerns (per MD/PA no standing on prosthetics until wound care comes to assess pt)  Assist level: Minimal Assistance - Patient > 75% Assistive device: Walker-platform Max distance: 10'   Walk 10  feet activity   Assist  Walk 10 feet activity did not occur: Safety/medical concerns (per MD/PA no standing on prosthetics until wound care comes to assess pt)        Walk 50 feet activity   Assist Walk 50 feet with 2 turns activity did not occur: Safety/medical concerns (per MD/PA no standing on prosthetics until wound care comes to assess pt)         Walk 150 feet activity   Assist Walk 150 feet activity did not occur: Safety/medical concerns (per MD/PA no standing on prosthetics until wound care comes to assess pt)         Walk 10 feet on uneven surface  activity   Assist Walk 10 feet on uneven surfaces activity did not occur: Safety/medical concerns (per MD/PA no standing on prosthetics until wound care comes to assess pt)         Wheelchair     Assist Will patient use wheelchair at discharge?: Yes Type of Wheelchair: Power Wheelchair activity did not occur: Safety/medical concerns (not able to bear weight through hands and did not don R prosthetic due to wound)  Wheelchair assist level: Moderate Assistance - Patient 50 - 74%, Minimal Assistance - Patient > 75% Max wheelchair distance: 300    Wheelchair 50 feet with 2 turns activity    Assist    Wheelchair 50 feet with 2 turns activity did not occur: Safety/medical concerns (not able to bear weight through hands and did not don R prosthetic due to wound)   Assist Level: Minimal Assistance - Patient > 75%   Wheelchair 150 feet activity     Assist  Wheelchair 150 feet activity did not occur: Safety/medical concerns (not able to bear weight through hands and did not don R prosthetic due to wound)   Assist Level: Minimal Assistance - Patient > 75%    Medical Problem List and Plan: 1.  Decreased functional mobility with failure to thrive secondary to PVD with history of bilateral BKA as well as status post amputation of multiple fingers critical limb ischemia.  Status post I&D of both hands  10/01/2020 per Dr. Burney Gauze. Orthopedic services advises to minimize weightbearing through hand as much as possible.  Continue CIR 2.  Antithrombotics: -DVT/anticoagulation: Subcutaneous heparin 5000U q8H              -antiplatelet therapy: N/A 3. Pain Management: Lyrica 100 mg nightly, oxycodone as needed             Monitor with increased exertion  Add Tylenol TID 11/5  Add Cymbalta 20mg  11/6 4. Mood: Provide emotional support             -antipsychotic agents: N/A 5. Neuropsych: This patient is capable of making decisions on his own behalf. 6. Skin/Wound Care: Routine skin checks with wound care nurse follow-up  Discussed with [employer]-lower extremity prosthesis not to be worn until adjustments made due to worsening ulcers 7. Fluids/Electrolytes/Nutrition: Routine and outs 8.  ID.  Wound culture left hand Proteus penneri.  Continue Bactrim for 2 more weeks from 10/04/2020.  Follow-up outpatient infectious disease clinic 9. ESRD.  Continue hemodialysis as directed             Recs per nephro 10.  Anemia of chronic disease.  Continue Aranesp.               Hemoglobin 7.8 on 11/3, labs with HD 11.  Hypertension.  Norvasc 10 mg daily, Coreg as directed, hydralazine 25 mg every 8 hours.               Well controlled 11/6 12.  Hyperlipidemia: Lipitor 13. Insomnia: start melatonin 3mg  HS on 11/6  LOS: 3 days A FACE TO FACE EVALUATION WAS PERFORMED  Christean Silvestri P Trinidy Masterson 10/09/2020, 1:48 PM

## 2020-10-09 NOTE — Progress Notes (Addendum)
[facility] Progress Note   Subjective: Seen in room. No C/Os.     Objective Vitals:   10/09/20 0500 10/09/20 0828 10/09/20 0831 10/09/20 1006  BP: (!) 182/84 135/71    Pulse: 72  79   Resp: 16     Temp: 98.6 F (37 C)     TempSrc:      SpO2: 100%     Weight:      Height:    6\' 4"  (1.93 m)   Physical Exam General: Pleasant male in NAD Heart: S1,S2 RRR No M/R/G Lungs: CTAB Abdomen: S, NT active BS Extremities: Bilateral BKA no stump edema. Ace wrap both hands. Drsg CDI Dialysis Access: L AVF + bruit   Additional Objective Labs: Basic Metabolic Panel: Recent Labs  Lab 10/04/20 1553 10/06/20 0748 10/08/20 1433  NA 135 133* 133*  K 3.9 4.0 4.2  CL 96* 96* 93*  CO2 20* 21* 24  GLUCOSE 118* 90 102*  BUN 70* 60* 60*  CREATININE 11.11* 9.27* 9.85*  CALCIUM 8.0* 8.6* 8.9  PHOS 10.0* 8.6* 7.3*   Liver Function Tests: Recent Labs  Lab 10/04/20 1553 10/06/20 0748 10/08/20 1433  ALBUMIN 2.5* 2.6* 2.7*   No results for input(s): LIPASE, AMYLASE in the last 168 hours. CBC: Recent Labs  Lab 10/04/20 1553 10/06/20 0748 10/08/20 1433  WBC 9.2 9.6 9.9  HGB 7.7* 7.8* 7.8*  HCT 25.3* 26.1* 25.9*  MCV 75.3* 74.6* 75.1*  PLT 193 192 224   Blood Culture    Component Value Date/Time   SDES WOUND 10/01/2020 1140   SPECREQUEST LEFT HAND SPEC A 10/01/2020 1140   CULT  10/01/2020 1140    FEW PROTEUS PENNERI FEW MORGANELLA MORGANII NO ANAEROBES ISOLATED Performed at [facility] [address] [place], [place] [address]    REPTSTATUS 10/06/2020 FINAL 10/01/2020 1140    Cardiac Enzymes: No results for input(s): CKTOTAL, CKMB, CKMBINDEX, TROPONINI in the last 168 hours. CBG: Recent Labs  Lab 10/02/20 2059 10/03/20 0703 10/03/20 1204 10/03/20 2103 10/04/20 0645  GLUCAP 102* 89 96 92 108*   Iron Studies:  Recent Labs    10/09/20 0656  IRON 48  TIBC 182*  FERRITIN 1,087*   @lablastinr3 @ Studies/Results: No results  found. Medications:  . acetaminophen  650 mg Oral TID   Or  . acetaminophen  650 mg Rectal TID  . amLODipine  10 mg Oral Daily  . atorvastatin  20 mg Oral QHS  . calcitRIOL  1.8 mcg Intravenous Q M,W,F-HD  . carvedilol  12.5 mg Oral Q breakfast  . carvedilol  12.5 mg Oral Once per day on Sun Tue Thu Sat  . Chlorhexidine Gluconate Cloth  6 each Topical Q0600  . [START ON 10/11/2020] darbepoetin (ARANESP) injection - DIALYSIS  150 mcg Intravenous Q Mon-HD  . docusate sodium  100 mg Oral BID  . heparin  5,000 Units Subcutaneous Q8H  . hydrALAZINE  25 mg Oral Q8H  . multivitamin  1 tablet Oral Q M,W,F-HD  . pregabalin  100 mg Oral QHS  . sevelamer carbonate  3,200 mg Oral TID WC  . sulfamethoxazole-trimethoprim  1 tablet Oral Daily   Dialysis Orders: GO MWF 4h 75min 200NRe 425/800 EDW 84kg 2K/2Ca L AVF No heparin  Venofer 100mg  IV x 10 (until 11/15) Mircera 150 q 2 weeks (last 10/15)  Calcitriol 1.75 TIW  Assessment/Plan: 1. PVD/L hand ischemia/infection. S/p recent amp L index finger.S/pOR washout on 10/29.Now in CIR 3. ESRD -HD MWF.Next  HD 10/11/2020 4. Hypertension/volume - Continues to be hypertensive despite lowering wt. HD 11/05 Net UF 1 liter post wt 83.5 kg. Lower EDW on DC. His volume is fine. Increase hydralazine to 50 mg TID.  5. Anemia - Hgb7.8.givenaranesp 150 mcg with HD 11/1.Fe 48 Tsat 26. Load with load with iron X 5 doses with HD. Increase Darbe to 200 mcg IV weekly.  6. Metabolic bone disease -Calcium controlled.PO4 elevated, but down trending after increase in binders.  7. DMT2 -per primary   Rita H. Brown NP-C 10/09/2020, 12:23 PM  [facility] [phone]  Pt seen, examined and agree w A/P as above.  [name]  MD 10/09/2020, 5:27 PM

## 2020-10-10 ENCOUNTER — Inpatient Hospital Stay (HOSPITAL_COMMUNITY): Payer: Medicare Other | Admitting: Physical Therapy

## 2020-10-10 ENCOUNTER — Inpatient Hospital Stay (HOSPITAL_COMMUNITY): Payer: Medicare Other | Admitting: Occupational Therapy

## 2020-10-10 MED ORDER — DULOXETINE HCL 30 MG PO CPEP
30.0000 mg | ORAL_CAPSULE | Freq: Every day | ORAL | Status: DC
  Administered 2020-10-11 – 2020-10-16 (×6): 30 mg via ORAL
  Filled 2020-10-10 (×6): qty 1

## 2020-10-10 NOTE — Plan of Care (Signed)
°  Problem: Consults Goal: RH GENERAL PATIENT EDUCATION Description: See Patient Education module for education specifics. Outcome: Progressing   Problem: RH SKIN INTEGRITY Goal: RH STG ABLE TO PERFORM INCISION/WOUND CARE W/ASSISTANCE Description: STG Able To Perform Incision/Wound Care With with mod I Assistance. Outcome: Progressing   Problem: RH PAIN MANAGEMENT Goal: RH STG PAIN MANAGED AT OR BELOW PT'S PAIN GOAL Description: Pain level less than 4 on scale of 0-10 Outcome: Progressing   Problem: RH KNOWLEDGE DEFICIT GENERAL Goal: RH STG INCREASE KNOWLEDGE OF SELF CARE AFTER HOSPITALIZATION Description: Pt will be able to demonstrate understanding of medication regimen and dietary modifications to prevent complications related to co-morbidities with mod I assist using handouts and booklet provided.  Outcome: Progressing

## 2020-10-10 NOTE — Progress Notes (Signed)
[place] PHYSICAL MEDICINE & REHABILITATION PROGRESS NOTE  Subjective/Complaints: Pain continues to return 3 hours after pain medications. Much better control of BP this afternoon Vitals stable Denies constipation  ROS: Denies CP, shortness of breath, nausea, vomiting, diarrhea.  Objective: Vital Signs: Blood pressure (!) 158/74, pulse 65, temperature 98.3 F (36.8 C), resp. rate 18, height 6\' 4"  (1.93 m), weight 83.5 kg, SpO2 97 %. No results found. Recent Labs    10/08/20 1433  WBC 9.9  HGB 7.8*  HCT 25.9*  PLT 224   Recent Labs    10/08/20 1433  NA 133*  K 4.2  CL 93*  CO2 24  GLUCOSE 102*  BUN 60*  CREATININE 9.85*  CALCIUM 8.9    Intake/Output Summary (Last 24 hours) at 10/10/2020 1408 Last data filed at 10/10/2020 0708 Gross per 24 hour  Intake 660 ml  Output --  Net 660 ml        Physical Exam: BP (!) 158/74 (BP Location: Right Arm)   Pulse 65   Temp 98.3 F (36.8 C)   Resp 18   Ht 6\' 4"  (1.93 m)   Wt 83.5 kg   SpO2 97%   BMI 22.41 kg/m   General: Alert and oriented x 2, No apparent distress HEENT: Head is normocephalic, atraumatic, PERRLA, EOMI, sclera anicteric, oral mucosa pink and moist, dentition intact, ext ear canals clear,  Neck: Supple without JVD or lymphadenopathy Heart: Reg rate and rhythm. No murmurs rubs or gallops Chest: CTA bilaterally without wheezes, rales, or rhonchi; no distress Abdomen: Soft, non-tender, non-distended, bowel sounds positive. Extremities: No clubbing, cyanosis, or edema. Pulses are 2+  Skin: Warm and dry. Dressing to bilateral hands CDI. Right knee with patella and right lateral tibial ulcer Left distal stump with lesion  Dry gangrene RUE 5th digit Psych: Normal mood.  Normal behavior. Musc: Bilateral hands with edema and tenderness. RUE: Amputation of digits 2-4 LUE: Amputation of 2nd digit  Neuro: Alert Motor: Makes eye contact with examiner.  Follows commands. Motor: Bilateral upper  extremities: Proximally 5/5, distally limited due to pain Bilateral lower extremities: Hip flexion, knee extension 5/5, unchanged   Assessment/Plan: 1. Functional deficits secondary to PVD s/p amputation of digits in bilateral hands with history of bilateral BKA which require 3+ hours per [name] of interdisciplinary therapy in a comprehensive inpatient rehab setting.  Physiatrist is providing close team supervision and 24 hour management of active medical problems listed below.  Physiatrist and rehab team continue to assess barriers to discharge/monitor patient progress toward functional and medical goals   Care Tool:  Bathing  Bathing activity did not occur: Refused Body parts bathed by patient: Right arm, Left arm, Chest, Abdomen, Front perineal area, Right upper leg, Left upper leg, Face, Left lower leg, Right lower leg         Bathing assist Assist Level: Minimal Assistance - Patient > 75%     Upper Body Dressing/Undressing Upper body dressing Upper body dressing/undressing activity did not occur (including orthotics): N/A What is the patient wearing?: Pull over shirt    Upper body assist Assist Level: Set up assist    Lower Body Dressing/Undressing Lower body dressing    Lower body dressing activity did not occur: N/A What is the patient wearing?: Pants     Lower body assist Assist for lower body dressing: Moderate Assistance - Patient 50 - 74%     Toileting Toileting Toileting Activity did not occur (Clothing management and hygiene only): N/A (no void  or bm)  Toileting assist Assist for toileting: Minimal Assistance - Patient > 75%     Transfers Chair/bed transfer  Transfers assist     Chair/bed transfer assist level: Contact Guard/Touching assist     Locomotion Ambulation   Ambulation assist   Ambulation activity did not occur: Safety/medical concerns (per MD/PA no standing on prosthetics until wound care comes to assess pt)  Assist level: Minimal  Assistance - Patient > 75% Assistive device: Walker-platform Max distance: 10'   Walk 10 feet activity   Assist  Walk 10 feet activity did not occur: Safety/medical concerns (per MD/PA no standing on prosthetics until wound care comes to assess pt)        Walk 50 feet activity   Assist Walk 50 feet with 2 turns activity did not occur: Safety/medical concerns (per MD/PA no standing on prosthetics until wound care comes to assess pt)         Walk 150 feet activity   Assist Walk 150 feet activity did not occur: Safety/medical concerns (per MD/PA no standing on prosthetics until wound care comes to assess pt)         Walk 10 feet on uneven surface  activity   Assist Walk 10 feet on uneven surfaces activity did not occur: Safety/medical concerns (per MD/PA no standing on prosthetics until wound care comes to assess pt)         Wheelchair     Assist Will patient use wheelchair at discharge?: Yes Type of Wheelchair: Manual Wheelchair activity did not occur: Safety/medical concerns (not able to bear weight through hands and did not don R prosthetic due to wound)  Wheelchair assist level: Supervision/Verbal cueing Max wheelchair distance: 150'    Wheelchair 50 feet with 2 turns activity    Assist    Wheelchair 50 feet with 2 turns activity did not occur: Safety/medical concerns (not able to bear weight through hands and did not don R prosthetic due to wound)   Assist Level: Supervision/Verbal cueing   Wheelchair 150 feet activity     Assist  Wheelchair 150 feet activity did not occur: Safety/medical concerns (not able to bear weight through hands and did not don R prosthetic due to wound)   Assist Level: Supervision/Verbal cueing    Medical Problem List and Plan: 1.  Decreased functional mobility with failure to thrive secondary to PVD with history of bilateral BKA as well as status post amputation of multiple fingers critical limb ischemia.   Status post I&D of both hands 10/01/2020 per Dr. Burney Gauze. Orthopedic services advises to minimize weightbearing through hand as much as possible.  Continue CIR 2.  Antithrombotics: -DVT/anticoagulation: Subcutaneous heparin 5000U q8H              -antiplatelet therapy: N/A 3. Pain Management: Lyrica 100 mg nightly, oxycodone as needed             Monitor with increased exertion  Add Tylenol TID 11/5  Increase cymbalta to 30mg  11/7 4. Mood: Provide emotional support             -antipsychotic agents: N/A 5. Neuropsych: This patient is capable of making decisions on his own behalf. 6. Skin/Wound Care: Routine skin checks with wound care nurse follow-up  Discussed with [employer]-lower extremity prosthesis not to be worn until adjustments made due to worsening ulcers 7. Fluids/Electrolytes/Nutrition: Routine and outs 8.  ID.  Wound culture left hand Proteus penneri.  Continue Bactrim for 2 more weeks from 10/04/2020.  Follow-up outpatient  infectious disease clinic 9. ESRD.  Continue hemodialysis as directed             Recs per nephro 10.  Anemia of chronic disease.  Continue Aranesp.               Hemoglobin 7.8 on 11/3, labs with HD 11.  Hypertension.  Norvasc 10 mg daily, Coreg as directed, hydralazine 25 mg every 8 hours.               Well controlled 11/7 12.  Hyperlipidemia: Lipitor 13. Insomnia: start melatonin 3mg  HS on 11/6  LOS: 4 days A FACE TO FACE EVALUATION WAS PERFORMED  [name] P [name] 10/10/2020, 2:08 PM

## 2020-10-10 NOTE — Progress Notes (Signed)
Occupational Therapy Session Note  Patient Details  Name: [name] MRN: [id number] Date of Birth: 05-12-1961  Today's Date: 10/10/2020 OT Individual Time: 9147-8295 and 1445-1530 OT Individual Time Calculation (min): 59 min and 45 min   Short Term Goals: Week 1:  OT Short Term Goal 1 (Week 1): STGs equal to LTGs set at supervison level overall.  Skilled Therapeutic Interventions/Progress Updates:    Pt greeted in the [facility] via PT handoff. He wanted to engage in UB self care tasks while seated at the sink. Able to doff his shirt with Min A for carefulness over Lt fistula site. OT covered his hands in plastic and then he bathed UB with supervision assist given increased time. Vcs for implementing adaptive strategies during several grooming tasks, Min A for oral care. Supervision/setup for donning overhead shirt. Afterwards educated pt on importance of routine pressure relief when sitting up in the [place], pt able to adjust degrees of tilt in the chair with min cuing. Practiced Lt>Rt weight shifting on elbows with chair in neutral tilt. Discussed d/c plans, pt still needs to have a ramp built at his home entrance. His PWC cannot fit inside of his bathroom so he will have to practice AP BSC transfers this week. Pt remained in his PWC, tilted back for pressure relief. Educated him to change degree of tilt every 30 minutes. Pt agreeable. He remained in the chair with all needs within reach and w/c belt fastened.   2nd Session 1:1 tx (45 min) Pt greeted in his PWC with no c/o pain. Motivated to go outdoors for tx vs practicing [facility] transfer. Worked on [facility] mobility and proficiency by navigating to the entrance of [place] required min cuing and increased time to adjust his speed setting, cues for improving control when he was maneuvering chair with increased speed in the hallway. Increased time to navigate [place] in/out of elevator and through doorways in the community settings of the hospital as  well. Pt traveling over carpeted, concrete, and textured surfaces. 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 Noted increased ease in navigating his PWC back into the building. Once he returned to his room, vcs for tilting PWC for pressure relief. Left pt in the chair with all needs within reach and w/c belt fastened.   Max A to don/doff his jacket during tx  Therapy Documentation Precautions:  Precautions Precautions: Fall Precaution Comments: bilateral prosthetics Restrictions Weight Bearing Restrictions: No Other Position/Activity Restrictions: MD and PA stated no weight bearing through hands but no formal order written Pain: RN in at start of 1st session to provide pain medicine per pt request Pain Assessment Pain Scale: 0-10 Pain Score: 3  Pain Type: Surgical pain Pain Location: Hand Pain Orientation: Right;Left Pain Descriptors / Indicators: Aching Pain Frequency: Constant Pain Onset: On-going Patients Stated Pain Goal: 2 Pain Intervention(s): Medication (See eMAR) Multiple Pain Sites: No ADL: ADL Eating: Supervision/safety Where Assessed-Eating: Edge of bed Grooming: Supervision/safety Where Assessed-Grooming: Edge of bed Upper Body Bathing: Supervision/safety Where Assessed-Upper Body Bathing: Edge of bed Lower Body Bathing: Minimal assistance Where Assessed-Lower Body Bathing: Edge of bed Upper Body Dressing: Supervision/safety Where Assessed-Upper Body Dressing: Edge of bed Lower Body Dressing: Minimal assistance Where Assessed-Lower Body Dressing: Edge of bed Toileting: Minimal assistance Where Assessed-Toileting: Bedside Commode Toilet Transfer: Minimal assistance Toilet Transfer Method: Counselling psychologist: Drop arm bedside commode Tub/Shower Transfer: Not assessed Social research officer, government: Not  assessed      Therapy/Group: Individual Therapy  Mico Spark A Taras Rask 10/10/2020, 12:36  PM

## 2020-10-10 NOTE — Progress Notes (Signed)
Physical Therapy Session Note  Patient Details  Name: [name] MRN: [id number] Date of Birth: 04-03-1961  Today's Date: 10/10/2020 PT Individual Time: 6734-1937; 9024-0973 PT Individual Time Calculation (min): 57 min and 30 min  Short Term Goals: Week 1:  PT Short Term Goal 1 (Week 1): pt will transfer stand<>pivot with LRAD CGA PT Short Term Goal 2 (Week 1): Pt will ambulate 77ft with LRAD CGA PT Short Term Goal 3 (Week 1): Pt will perform simulated car transfer with LRAD CGA  Skilled Therapeutic Interventions/Progress Updates:    Session 1: Pt received seated in bed, agreeable to PT session. Pt reports 6/10 pain B hands at rest that increases in supine position and is relieved when sitting up. Pt reports taking Tylenol prior to start of therapy session. Lateral scoot transfer bed to [facility] with slide board and CGA for core control. Pt is at Supervision level for PWC mobility once seated up in chair with cues for speed adjustment and review of PWC controls. Lateral scoot transfer [place] to/from mat table with CGA and no slide board. Sit to supine Supervision, supine to sit min A needed for trunk control. Session focus on LE strengthening in supine and seated position. Supine BLE therex: SLR, hip abd, SAQ. Seated hip add squeeze and LAQ x 15 reps each. Pt left seated in PWC in room with needs in reach at end of session, PWC plugged in due to low battery.  Session 2: Pt received seated in PWC in room, agreeable to PT session. No complaints of pain. PWC mobility to/from therapy gym at Supervision level. Reviewed how to change speeds when driving and when appropriate to change speeds (ex: decreased speed for tighter spaces and turns into rooms). [place] navigation through obstacle course weaving through cones with cues for safe driving and obstacle avoidance. Pt requires max cues to understand his center of mass in [place] and how to avoid obstacles. Reviewed how to utilize chair features to position with  regards to tilt/recline as well as LE positioning. Pt requests to return to bed at end of session. Lateral scoot transfer PWC to bed with CGA. Pt left seated in bed with needs in reach, bed alarm in place at end of session.  Therapy Documentation Precautions:  Precautions Precautions: Fall Precaution Comments: bilateral prosthetics Restrictions Weight Bearing Restrictions: No Other Position/Activity Restrictions: MD and PA stated no weight bearing through hands but no formal order written   Therapy/Group: Individual Therapy   Excell Seltzer, PT, DPT  10/10/2020, 9:59 AM

## 2020-10-11 ENCOUNTER — Inpatient Hospital Stay (HOSPITAL_COMMUNITY): Payer: Medicare Other

## 2020-10-11 ENCOUNTER — Inpatient Hospital Stay (HOSPITAL_COMMUNITY): Payer: Medicare Other | Admitting: Occupational Therapy

## 2020-10-11 LAB — RENAL FUNCTION PANEL
Albumin: 2.8 g/dL — ABNORMAL LOW (ref 3.5–5.0)
Anion gap: 19 — ABNORMAL HIGH (ref 5–15)
BUN: 69 mg/dL — ABNORMAL HIGH (ref 8–23)
CO2: 22 mmol/L (ref 22–32)
Calcium: 8.8 mg/dL — ABNORMAL LOW (ref 8.9–10.3)
Chloride: 92 mmol/L — ABNORMAL LOW (ref 98–111)
Creatinine, Ser: 11.28 mg/dL — ABNORMAL HIGH (ref 0.61–1.24)
GFR, Estimated: 5 mL/min — ABNORMAL LOW (ref >60)
Glucose, Bld: 79 mg/dL (ref 70–99)
Phosphorus: 7.3 mg/dL — ABNORMAL HIGH (ref 2.5–4.6)
Potassium: 4.8 mmol/L (ref 3.5–5.1)
Sodium: 133 mmol/L — ABNORMAL LOW (ref 135–145)

## 2020-10-11 LAB — CBC
HCT: 27.1 % — ABNORMAL LOW (ref 39.0–52.0)
Hemoglobin: 8 g/dL — ABNORMAL LOW (ref 13.0–17.0)
MCH: 22.5 pg — ABNORMAL LOW (ref 26.0–34.0)
MCHC: 29.5 g/dL — ABNORMAL LOW (ref 30.0–36.0)
MCV: 76.3 fL — ABNORMAL LOW (ref 80.0–100.0)
Platelets: 214 10*3/uL (ref 150–400)
RBC: 3.55 MIL/uL — ABNORMAL LOW (ref 4.22–5.81)
RDW: 19 % — ABNORMAL HIGH (ref 11.5–15.5)
WBC: 11.6 10*3/uL — ABNORMAL HIGH (ref 4.0–10.5)
nRBC: 0 % (ref 0.0–0.2)

## 2020-10-11 MED ORDER — WHITE PETROLATUM EX OINT
TOPICAL_OINTMENT | Freq: Two times a day (BID) | CUTANEOUS | Status: DC
  Administered 2020-10-11 – 2020-10-12 (×4): 0.2 via TOPICAL
  Administered 2020-10-13: 1 via TOPICAL
  Administered 2020-10-13 – 2020-10-16 (×3): 0.2 via TOPICAL
  Filled 2020-10-11 (×8): qty 28.35

## 2020-10-11 MED ORDER — LIDOCAINE HCL (PF) 1 % IJ SOLN: 5.0000 mL | INTRAMUSCULAR | Status: DC | PRN

## 2020-10-11 MED ORDER — SODIUM CHLORIDE 0.9 % IV SOLN: 100.0000 mL | INTRAVENOUS | Status: DC | PRN

## 2020-10-11 MED ORDER — DARBEPOETIN ALFA 200 MCG/0.4ML IJ SOSY
PREFILLED_SYRINGE | INTRAMUSCULAR | Status: AC
  Administered 2020-10-11: 200 ug via INTRAVENOUS
  Filled 2020-10-11: qty 0.4

## 2020-10-11 MED ORDER — CALCITRIOL 1 MCG/ML IV SOLN
INTRAVENOUS | Status: AC
  Filled 2020-10-11: qty 2

## 2020-10-11 MED ORDER — PENTAFLUOROPROP-TETRAFLUOROETH EX AERO: 1.0000 "application " | INHALATION_SPRAY | CUTANEOUS | Status: DC | PRN

## 2020-10-11 MED ORDER — LIDOCAINE-PRILOCAINE 2.5-2.5 % EX CREA: 1.0000 "application " | TOPICAL_CREAM | CUTANEOUS | Status: DC | PRN

## 2020-10-11 NOTE — Plan of Care (Signed)
  Problem: Consults Goal: RH GENERAL PATIENT EDUCATION Description: See Patient Education module for education specifics. Outcome: Progressing   Problem: RH SKIN INTEGRITY Goal: RH STG ABLE TO PERFORM INCISION/WOUND CARE W/ASSISTANCE Description: STG Able To Perform Incision/Wound Care With with mod I Assistance. Outcome: Progressing   Problem: RH PAIN MANAGEMENT Goal: RH STG PAIN MANAGED AT OR BELOW PT'S PAIN GOAL Description: Pain level less than 4 on scale of 0-10 Outcome: Progressing   Problem: RH KNOWLEDGE DEFICIT GENERAL Goal: RH STG INCREASE KNOWLEDGE OF SELF CARE AFTER HOSPITALIZATION Description: Pt will be able to demonstrate understanding of medication regimen and dietary modifications to prevent complications related to co-morbidities with mod I assist using handouts and booklet provided.  Outcome: Progressing

## 2020-10-11 NOTE — Plan of Care (Signed)
  Problem: Consults Goal: RH GENERAL PATIENT EDUCATION Description: See Patient Education module for education specifics. 10/11/2020 2314 by Alysia Penna, RN Outcome: Progressing 10/11/2020 2314 by Alysia Penna, RN Outcome: Progressing 10/11/2020 2313 by Alysia Penna, RN Outcome: Progressing   Problem: RH SKIN INTEGRITY Goal: RH STG ABLE TO PERFORM INCISION/WOUND CARE W/ASSISTANCE Description: STG Able To Perform Incision/Wound Care With with mod I Assistance. 10/11/2020 2314 by Alysia Penna, RN Outcome: Progressing 10/11/2020 2314 by Alysia Penna, RN Outcome: Progressing 10/11/2020 2313 by Alysia Penna, RN Outcome: Progressing   Problem: RH PAIN MANAGEMENT Goal: RH STG PAIN MANAGED AT OR BELOW PT'S PAIN GOAL Description: Pain level less than 4 on scale of 0-10 10/11/2020 2314 by Alysia Penna, RN Outcome: Progressing 10/11/2020 2314 by Alysia Penna, RN Outcome: Progressing 10/11/2020 2313 by Alysia Penna, RN Outcome: Progressing   Problem: RH KNOWLEDGE DEFICIT GENERAL Goal: RH STG INCREASE KNOWLEDGE OF SELF CARE AFTER HOSPITALIZATION Description: Pt will be able to demonstrate understanding of medication regimen and dietary modifications to prevent complications related to co-morbidities with mod I assist using handouts and booklet provided.  10/11/2020 2314 by Alysia Penna, RN Outcome: Progressing 10/11/2020 2314 by Alysia Penna, RN Outcome: Progressing 10/11/2020 2313 by Alysia Penna, RN Outcome: Progressing

## 2020-10-11 NOTE — Progress Notes (Signed)
Physical Therapy Session Note  Patient Details  Name: [name] MRN: [id number] Date of Birth: 04/02/1961  Today's Date: 10/11/2020 PT Individual Time: 0915-1011 PT Individual Time Calculation (min): 56 min   Short Term Goals: Week 1:  PT Short Term Goal 1 (Week 1): pt will transfer stand<>pivot with LRAD CGA PT Short Term Goal 2 (Week 1): Pt will ambulate 32ft with LRAD CGA PT Short Term Goal 3 (Week 1): Pt will perform simulated car transfer with LRAD CGA  Skilled Therapeutic Interventions/Progress Updates:   Received pt sitting in power WC, pt agreeable to therapy, and denied any pain at rest but reported increased bilateral hand pain when laying in supine. Session with emphasis on functional mobility/transfers, generalized strengthening, power WC mobility, and improved activity tolerance. Pt performed power WC mobility 163ft x 2 trials to/from therapy gym with supervision and min cues to control speed on joystick. Pt transferred power WC<>mat via AP transfer with CGA while weight bearing through elbows to protect hands and sit<>supine with supervision. Pt performed the following exercises supine on wedge with supervision and verbal cues for technique: -SLR with 1.5lb ankle weight 2x10 bilaterally  -hip abduction with 1.5lb ankle weight 2x10 bilaterally  -hip adduction ball squeezes 2x10 with 3 second isometric hold -bridges with BLE supported on bolster 2x15 -SAQ on bolster with 1.5lb ankle weight 2x10 bilaterally  -single arm chest press with 4lb wrist weight 2x15 bilaterally  -overhead shoulder flexion with 4lb wrist weights 2x10 bilaterally with emphasis on eccentric control. Pt required multiple rest breaks throughout session due to fatigue. Pt transferred supine<>sitting on mat with supervision and mat<>power WC via PA transfer weight bearing through elbows. Discussed asking pt's wife to measure doorways to ensure power WC can safely fit through; pt in agreement. Therapist  educated pt on how to charge power WC and pt demonstrated understanding. Concluded session with pt sitting in power WC, needs within reach, and seatbelt on.   Therapy Documentation Precautions:  Precautions Precautions: Fall Precaution Comments: bilateral prosthetics Restrictions Weight Bearing Restrictions: No Other Position/Activity Restrictions: MD and PA stated no weight bearing through hands but no formal order written   Therapy/Group: Individual Therapy Alfonse Alpers PT, DPT   10/11/2020, 7:29 AM

## 2020-10-11 NOTE — Progress Notes (Signed)
[facility] Progress Note   Subjective: Seen in room. No C/Os.  No issues with dialysis of late.   Objective Vitals:   10/10/20 1406 10/10/20 2045 10/11/20 0455 10/11/20 0728  BP: 119/65 131/66 (!) 160/70 132/72  Pulse: 66 67 70 75  Resp: 18 17 18    Temp: 98.4 F (36.9 C) 98.4 F (36.9 C) 98.3 F (36.8 C)   TempSrc:   Oral   SpO2:   99%   Weight:      Height:       Physical Exam General: Pleasant male in NAD Heart: S1,S2 RRR No M/R/G Lungs: CTAB Abdomen: S, NT active BS Extremities: Bilateral BKA no stump edema. Ace wrap both hands. Drsg CDI Dialysis Access: L AVF + bruit   Additional Objective Labs: Basic Metabolic Panel: Recent Labs  Lab 10/04/20 1553 10/06/20 0748 10/08/20 1433  NA 135 133* 133*  K 3.9 4.0 4.2  CL 96* 96* 93*  CO2 20* 21* 24  GLUCOSE 118* 90 102*  BUN 70* 60* 60*  CREATININE 11.11* 9.27* 9.85*  CALCIUM 8.0* 8.6* 8.9  PHOS 10.0* 8.6* 7.3*   Liver Function Tests: Recent Labs  Lab 10/04/20 1553 10/06/20 0748 10/08/20 1433  ALBUMIN 2.5* 2.6* 2.7*   No results for input(s): LIPASE, AMYLASE in the last 168 hours. CBC: Recent Labs  Lab 10/04/20 1553 10/06/20 0748 10/08/20 1433  WBC 9.2 9.6 9.9  HGB 7.7* 7.8* 7.8*  HCT 25.3* 26.1* 25.9*  MCV 75.3* 74.6* 75.1*  PLT 193 192 224   Blood Culture    Component Value Date/Time   SDES WOUND 10/01/2020 1140   SPECREQUEST LEFT HAND SPEC A 10/01/2020 1140   CULT  10/01/2020 1140    FEW PROTEUS PENNERI FEW MORGANELLA MORGANII NO ANAEROBES ISOLATED Performed at [facility] [address], [place], [place] [address]    REPTSTATUS 10/06/2020 FINAL 10/01/2020 1140    Cardiac Enzymes: No results for input(s): CKTOTAL, CKMB, CKMBINDEX, TROPONINI in the last 168 hours. CBG: No results for input(s): GLUCAP in the last 168 hours. Iron Studies:  Recent Labs    10/09/20 0656  IRON 48  TIBC 182*  FERRITIN 1,087*   @lablastinr3 @ Studies/Results: No results  found. Medications: . iron sucrose     . acetaminophen  650 mg Oral TID   Or  . acetaminophen  650 mg Rectal TID  . amLODipine  10 mg Oral Daily  . atorvastatin  20 mg Oral QHS  . calcitRIOL  1.8 mcg Intravenous Q M,W,F-HD  . carvedilol  12.5 mg Oral Q breakfast  . carvedilol  12.5 mg Oral Once per day on Sun Tue Thu Sat  . Chlorhexidine Gluconate Cloth  6 each Topical Q0600  . darbepoetin (ARANESP) injection - DIALYSIS  200 mcg Intravenous Q Mon-HD  . docusate sodium  100 mg Oral BID  . DULoxetine  30 mg Oral Daily  . heparin  5,000 Units Subcutaneous Q8H  . hydrALAZINE  50 mg Oral Q8H  . melatonin  3 mg Oral QHS  . multivitamin  1 tablet Oral Q M,W,F-HD  . pregabalin  100 mg Oral QHS  . sevelamer carbonate  3,200 mg Oral TID WC  . sulfamethoxazole-trimethoprim  1 tablet Oral Daily  . white petrolatum   Topical BID   Dialysis Orders: GO MWF 4h 53min 200NRe 425/800 EDW 84kg 2K/2Ca L AVF No heparin  Venofer 100mg  IV x 10 (until 11/15) Mircera 150 q 2 weeks (last 10/15)  Calcitriol 1.75  TIW  Assessment/Plan: 1. PVD/L hand ischemia/infection. S/p recent amp L index finger.S/pOR washout on 10/29.Now in CIR - says unaware of anticipated d/c date.  3. ESRD -HD MWF.Next HD today.   4. Hypertension/volume - now normotensive. Lower EDW on DC. His volume is fine. Maintain current meds.   5. Anemia - Hgb7.8.givenaranesp 150 mcg with HD 11/1.Fe 48 Tsat 26. Load with load with iron X 5 doses with HD. Increased Darbe to 200 mcg IV weekly.  6. Metabolic bone disease -Calcium controlled.PO4 elevated, but down trending after increase in binders.  7. DMT2 -per primary   Jannifer Hick MD [facility] Pager [phone]

## 2020-10-11 NOTE — Progress Notes (Signed)
[place] PHYSICAL MEDICINE & REHABILITATION PROGRESS NOTE  Subjective/Complaints:   Pt reports still having pain.  Bowels OK.  Having most of pain in hands- asking why cannot wear prosthetics- waiting for new liners from [place].   Per OT, R 5th finger looks more necrotic than other fingers.    ROS:  Pt denies SOB, abd pain, CP, N/V/C/D, and vision changes  Objective: Vital Signs: Blood pressure 132/72, pulse 75, temperature 98.3 F (36.8 C), temperature source Oral, resp. rate 18, height 6\' 4"  (1.93 m), weight 83.5 kg, SpO2 99 %. No results found. Recent Labs    10/08/20 1433  WBC 9.9  HGB 7.8*  HCT 25.9*  PLT 224   Recent Labs    10/08/20 1433  NA 133*  K 4.2  CL 93*  CO2 24  GLUCOSE 102*  BUN 60*  CREATININE 9.85*  CALCIUM 8.9    Intake/Output Summary (Last 24 hours) at 10/11/2020 0919 Last data filed at 10/11/2020 0700 Gross per 24 hour  Intake 920 ml  Output --  Net 920 ml        Physical Exam: BP 132/72   Pulse 75   Temp 98.3 F (36.8 C) (Oral)   Resp 18   Ht 6\' 4"  (1.93 m)   Wt 83.5 kg   SpO2 99%   BMI 22.41 kg/m   General: sitting up doing a BSC to bed transfer- practicing with OT, NAD HEENT: conjugate gaze Neck: Supple without JVD or lymphadenopathy Heart: RRR Chest: CTA B/L- no W/R/R- good air movement Abdomen: Soft, NT, ND, (+)BS  Extremities: No clubbing, cyanosis, or edema.  Skin: Warm and dry. Dressing to bilateral hands CDI- asked nursing to assess since doing active OT-  Right knee with patella and right lateral tibial ulcer Left distal stump with lesion- very dry cracked skin on tip of B/L BKA's  Dry gangrene RUE 5th digit Psych: Normal mood.  Normal behavior. Musc: Bilateral hands with edema and tenderness. RUE: Amputation of digits 2-4 LUE: Amputation of 2nd digit  Neuro: Alert Motor: Makes eye contact with examiner.  Follows commands. Motor: Bilateral upper extremities: Proximally 5/5, distally limited due to  pain Bilateral lower extremities: Hip flexion, knee extension 5/5, unchanged   Assessment/Plan: 1. Functional deficits secondary to PVD s/p amputation of digits in bilateral hands with history of bilateral BKA which require 3+ hours per day of interdisciplinary therapy in a comprehensive inpatient rehab setting.  Physiatrist is providing close team supervision and 24 hour management of active medical problems listed below.  Physiatrist and rehab team continue to assess barriers to discharge/monitor patient progress toward functional and medical goals   Care Tool:  Bathing  Bathing activity did not occur: Refused Body parts bathed by patient: Right arm, Left arm, Chest, Abdomen, Front perineal area, Right upper leg, Left upper leg, Face, Left lower leg, Right lower leg         Bathing assist Assist Level: Minimal Assistance - Patient > 75%     Upper Body Dressing/Undressing Upper body dressing Upper body dressing/undressing activity did not occur (including orthotics): N/A What is the patient wearing?: Pull over shirt    Upper body assist Assist Level: Set up assist    Lower Body Dressing/Undressing Lower body dressing    Lower body dressing activity did not occur: N/A What is the patient wearing?: Pants     Lower body assist Assist for lower body dressing: Moderate Assistance - Patient 50 - 74%     Toileting  Toileting Toileting Activity did not occur Landscape architect and hygiene only): N/A (no void or bm)  Toileting assist Assist for toileting: Minimal Assistance - Patient > 75%     Transfers Chair/bed transfer  Transfers assist     Chair/bed transfer assist level: Contact Guard/Touching assist     Locomotion Ambulation   Ambulation assist   Ambulation activity did not occur: Safety/medical concerns (per MD/PA no standing on prosthetics until wound care comes to assess pt)  Assist level: Minimal Assistance - Patient > 75% Assistive device:  Walker-platform Max distance: 10'   Walk 10 feet activity   Assist  Walk 10 feet activity did not occur: Safety/medical concerns (per MD/PA no standing on prosthetics until wound care comes to assess pt)        Walk 50 feet activity   Assist Walk 50 feet with 2 turns activity did not occur: Safety/medical concerns (per MD/PA no standing on prosthetics until wound care comes to assess pt)         Walk 150 feet activity   Assist Walk 150 feet activity did not occur: Safety/medical concerns (per MD/PA no standing on prosthetics until wound care comes to assess pt)         Walk 10 feet on uneven surface  activity   Assist Walk 10 feet on uneven surfaces activity did not occur: Safety/medical concerns (per MD/PA no standing on prosthetics until wound care comes to assess pt)         Wheelchair     Assist Will patient use wheelchair at discharge?: Yes Type of Wheelchair: Manual Wheelchair activity did not occur: Safety/medical concerns (not able to bear weight through hands and did not don R prosthetic due to wound)  Wheelchair assist level: Supervision/Verbal cueing Max wheelchair distance: 150'    Wheelchair 50 feet with 2 turns activity    Assist    Wheelchair 50 feet with 2 turns activity did not occur: Safety/medical concerns (not able to bear weight through hands and did not don R prosthetic due to wound)   Assist Level: Supervision/Verbal cueing   Wheelchair 150 feet activity     Assist  Wheelchair 150 feet activity did not occur: Safety/medical concerns (not able to bear weight through hands and did not don R prosthetic due to wound)   Assist Level: Supervision/Verbal cueing    Medical Problem List and Plan: 1.  Decreased functional mobility with failure to thrive secondary to PVD with history of bilateral BKA as well as status post amputation of multiple fingers critical limb ischemia.  Status post I&D of both hands 10/01/2020 per Dr.  Burney Gauze. Orthopedic services advises to minimize weightbearing through hand as much as possible.  11/8- asked nursing to reassess R 5th digit and let me know if need to change plan  Continue CIR 2.  Antithrombotics: -DVT/anticoagulation: Subcutaneous heparin 5000U q8H              -antiplatelet therapy: N/A 3. Pain Management: Lyrica 100 mg nightly, oxycodone as needed             Monitor with increased exertion  Add Tylenol TID 11/5  Increase cymbalta to 30mg  11/7  11/8- taking oxy as often as can- might benefit form long acting pain med 4. Mood: Provide emotional support             -antipsychotic agents: N/A 5. Neuropsych: This patient is capable of making decisions on his own behalf. 6. Skin/Wound Care: Routine skin checks with wound  care nurse follow-up  Discussed with [employer]-lower extremity prosthesis not to be worn until adjustments made due to worsening ulcers  11/8- per pt, waiting for new liners- will need those prior to wearing prostheses- will order vaseline BID for B/L BKAs 7. Fluids/Electrolytes/Nutrition: Routine and outs 8.  ID.  Wound culture left hand Proteus penneri.  Continue Bactrim for 2 more weeks from 10/04/2020.  Follow-up outpatient infectious disease clinic 9. ESRD.  Continue hemodialysis as directed             Recs per nephro 10.  Anemia of chronic disease.  Continue Aranesp.               Hemoglobin 7.8 on 11/3, labs with HD 11.  Hypertension.  Norvasc 10 mg daily, Coreg as directed, hydralazine 25 mg every 8 hours.               Well controlled 11/7 12.  Hyperlipidemia: Lipitor 13. Insomnia: start melatonin 3mg  HS on 11/6  LOS: 5 days A FACE TO FACE EVALUATION WAS PERFORMED  Theda Payer 10/11/2020, 9:19 AM

## 2020-10-11 NOTE — Progress Notes (Signed)
Occupational Therapy Session Note  Patient Details  Name: [name] MRN: [id number] Date of Birth: August 22, 1961  Today's Date: 10/11/2020 OT Individual Time: 9450-3888 OT Individual Time Calculation (min): 73 min    Short Term Goals: Week 1:  OT Short Term Goal 1 (Week 1): STGs equal to LTGs set at supervison level overall.  Skilled Therapeutic Interventions/Progress Updates:    Treatment session with focus on functional transfers, self-care retraining, and BLE strengthening.  Pt received seated EOB reporting already washed up and dressed this morning.  Pt reports pain in hands, recently medicated.  Discussed home setup, pt reports power w/c will not fit in bathroom.  Discussed use of BSC next to bed to complete A/P transfers to complete toileting.  Therapist educated on technique and providing stability to [facility], pt then able to complete posterior scoot on to [facility] while WB through B elbows with supervision.  Discussed completing clothing management on bed prior to and post transfers due to limited space for lateral leans on BSC.  Pt completed anterior scoot back to bed with CGA.  Completed A/P transfers x2 and recommend this technique until cleared to don prostheses for ambulatory transfers.  Pt completed lateral scoot transfer to power w/c with setup assist and CGA during transfer.  Pt completed oral care and washing face with setup assist.  Engaged in w/c mobility in hallways with focus on navigating obstacles and changing speed settings as appropriate to situation.  Pt completed lateral > posterior transfer when transferring on to therapy mat with CGA.  Engaged in 4 sets of 10 hip flexion and hip abduction bilaterally for BLE endurance while discussing positional pain in B hands.  Pt returned to room and remained upright in power w/c.  Discussed pressure relief recommendations, pt able to toggle to chair setting and perform pressure relief with min cues.    Therapy Documentation Precautions:   Precautions Precautions: Fall Precaution Comments: bilateral prosthetics Restrictions Weight Bearing Restrictions: No Other Position/Activity Restrictions: MD and PA stated no weight bearing through hands but no formal order written General:   Vital Signs: Therapy Vitals Pulse Rate: 75 BP: 132/72 Pain: Pain Assessment Pain Scale: 0-10 Pain Score: 9  Pain Type: Surgical pain Pain Location: Hand Pain Orientation: Right;Left Pain Descriptors / Indicators: Aching Pain Frequency: Constant Pain Onset: On-going Patients Stated Pain Goal: 2 Pain Intervention(s): Medication (See eMAR)   Therapy/Group: Individual Therapy  Simonne Come 10/11/2020, 8:59 AM

## 2020-10-11 NOTE — Progress Notes (Signed)
Physical Therapy Session Note  Patient Details  Name: [name] MRN: [id number] Date of Birth: 1960/12/18  Today's Date: 10/11/2020 PT Individual Time: 1100-1200 PT Individual Time Calculation (min): 60 min   Short Term Goals: Week 1:  PT Short Term Goal 1 (Week 1): pt will transfer stand<>pivot with LRAD CGA PT Short Term Goal 2 (Week 1): Pt will ambulate 58ft with LRAD CGA PT Short Term Goal 3 (Week 1): Pt will perform simulated car transfer with LRAD CGA Week 2:     Skilled Therapeutic Interventions/Progress Updates:    PAIN denies pain  Pt seen this am for wc evaluation.  Therapist arranged for joint assessment w/patient, wc vendor [name] from [employer], and this therapist.   Pt in agreement w/assessment, wc measurements obtained and proceeded w/recommendations as follows: PWC due to wbing restrictions, endurance issues, skin inegrity issues/mult amputations bilt UE and LEs, chronic ESRD/diabetes Trial joystick controller Amputee support pads/removable Extended seat depth wc/cushion, pt is 23ft 4" TIS for positioning, pressure relief Tie downs for public transport/transaid/scat L side positioning of pwc controls Seat belt for safety Vendor to supply loaner wc to be used during rehab stay and transported to home by vendor at dc.  Pt persuing construction of ramp due to >3in threshold at front door Wife to provide doorway measurements per pt.  Following discussion of wc plans, session continued w/propulsion trials including: Changing speeds of wc Operation of seat back recline/return to upright Weaving thru cones for simulated home environment/navigation Practicing backing of chair thru cones to simulate home obstacles  Pt able to perform set up ov wc to bed w/cues for safety Transfers via ant approach w/cues and supervision only. wc powered down and set up for charging by therapist. Pt left long sitting in bed w/rails up x 3, tray table at bedside, alarm  set, bed in lowest position, and needs in reach.   Therapy Documentation Precautions:  Precautions Precautions: Fall Precaution Comments: bilateral prosthetics Restrictions Weight Bearing Restrictions: No Other Position/Activity Restrictions: MD and PA stated no weight bearing through hands but no formal order written    Therapy/Group: Individual Therapy  Callie Fielding, [place] 10/11/2020, 12:16 PM

## 2020-10-11 NOTE — Progress Notes (Signed)
[facility] ([facility]) Community Based Palliative Care       This patient is enrolled in our palliative care services in the community.  [facility] will continue to follow for any discharge planning needs and to coordinate continuation of palliative care.   If you have questions or need assistance, please call [phone] or contact the hospital Liaison listed on AMION.     Thank you for the opportunity to participate in this patient's care.     Chrislyn [name], BSN, RN [facility] Hospital Liaison   [phone]   

## 2020-10-11 NOTE — Progress Notes (Signed)
[facility] Progress Note   Subjective:   Completed dialysis yesterday 2L UF Access arm feels sore. No cp, sob   Objective Vitals:   10/10/20 1406 10/10/20 2045 10/11/20 0455 10/11/20 0728  BP: 119/65 131/66 (!) 160/70 132/72  Pulse: 66 67 70 75  Resp: 18 17 18    Temp: 98.4 F (36.9 C) 98.4 F (36.9 C) 98.3 F (36.8 C)   TempSrc:   Oral   SpO2:   99%   Weight:      Height:       Physical Exam General: Pleasant male in NAD Heart: S1,S2 RRR No M/R/G Lungs: CTAB Abdomen: S, NT active BS Extremities: Bilateral BKA no stump edema. Ace wrap both hands. Drsg CDI Dialysis Access: L AVF + bruit   Additional Objective Labs: Basic Metabolic Panel: Recent Labs  Lab 10/04/20 1553 10/06/20 0748 10/08/20 1433  NA 135 133* 133*  K 3.9 4.0 4.2  CL 96* 96* 93*  CO2 20* 21* 24  GLUCOSE 118* 90 102*  BUN 70* 60* 60*  CREATININE 11.11* 9.27* 9.85*  CALCIUM 8.0* 8.6* 8.9  PHOS 10.0* 8.6* 7.3*   Liver Function Tests: Recent Labs  Lab 10/04/20 1553 10/06/20 0748 10/08/20 1433  ALBUMIN 2.5* 2.6* 2.7*   No results for input(s): LIPASE, AMYLASE in the last 168 hours. CBC: Recent Labs  Lab 10/04/20 1553 10/06/20 0748 10/08/20 1433  WBC 9.2 9.6 9.9  HGB 7.7* 7.8* 7.8*  HCT 25.3* 26.1* 25.9*  MCV 75.3* 74.6* 75.1*  PLT 193 192 224   Blood Culture    Component Value Date/Time   SDES WOUND 10/01/2020 1140   SPECREQUEST LEFT HAND SPEC A 10/01/2020 1140   CULT  10/01/2020 1140    FEW PROTEUS PENNERI FEW MORGANELLA MORGANII NO ANAEROBES ISOLATED Performed at [facility] [address], [place], [place] [address]    REPTSTATUS 10/06/2020 FINAL 10/01/2020 1140    Cardiac Enzymes: No results for input(s): CKTOTAL, CKMB, CKMBINDEX, TROPONINI in the last 168 hours. CBG: No results for input(s): GLUCAP in the last 168 hours. Iron Studies:  Recent Labs    10/09/20 0656  IRON 48  TIBC 182*  FERRITIN 1,087*    @lablastinr3 @ Studies/Results: No results found. Medications: . iron sucrose     . acetaminophen  650 mg Oral TID   Or  . acetaminophen  650 mg Rectal TID  . amLODipine  10 mg Oral Daily  . atorvastatin  20 mg Oral QHS  . calcitRIOL  1.8 mcg Intravenous Q M,W,F-HD  . carvedilol  12.5 mg Oral Q breakfast  . carvedilol  12.5 mg Oral Once per day on Sun Tue Thu Sat  . Chlorhexidine Gluconate Cloth  6 each Topical Q0600  . darbepoetin (ARANESP) injection - DIALYSIS  200 mcg Intravenous Q Mon-HD  . docusate sodium  100 mg Oral BID  . DULoxetine  30 mg Oral Daily  . heparin  5,000 Units Subcutaneous Q8H  . hydrALAZINE  50 mg Oral Q8H  . melatonin  3 mg Oral QHS  . multivitamin  1 tablet Oral Q M,W,F-HD  . pregabalin  100 mg Oral QHS  . sevelamer carbonate  3,200 mg Oral TID WC  . sulfamethoxazole-trimethoprim  1 tablet Oral Daily  . white petrolatum   Topical BID   Dialysis Orders: GO MWF 4h 42min 200NRe 425/800 EDW 84kg 2K/2Ca L AVF No heparin  Venofer 100mg  IV x 10 (until 11/15) Mircera 150 q 2 weeks (last 10/15)  Calcitriol 1.75 TIW  Assessment/Plan: 1. PVD/L hand ischemia/infection. S/p recent amp L index finger.S/pOR washout on 10/29.Now in CIR 3. ESRD -HD MWF.Next HD 11/10 4. Hypertension/volume - Continues to be hypertensive despite lowering wt. HD 11/05 Net UF 1 liter post wt 83.5 kg. Lower EDW on DC. His volume is fine. Increase hydralazine to 50 mg TID.  5. Anemia - Hgb8.0Aranesp increased 200 q Monday. Tsat 26% Load with load with iron X 5 doses with HD 6. Metabolic bone disease -Calcium controlled.PO4 elevated, but down trending after increase in binders.  7. DMT2 -per primary  Lynnda Child PA-C Tat Momoli 10/12/2020,10:35 AM

## 2020-10-12 ENCOUNTER — Inpatient Hospital Stay (HOSPITAL_COMMUNITY): Payer: Medicare Other | Admitting: Occupational Therapy

## 2020-10-12 ENCOUNTER — Inpatient Hospital Stay (HOSPITAL_COMMUNITY): Payer: Medicare Other

## 2020-10-12 DIAGNOSIS — Z992 Dependence on renal dialysis: Secondary | ICD-10-CM

## 2020-10-12 DIAGNOSIS — E785 Hyperlipidemia, unspecified: Secondary | ICD-10-CM

## 2020-10-12 DIAGNOSIS — R0989 Other specified symptoms and signs involving the circulatory and respiratory systems: Secondary | ICD-10-CM

## 2020-10-12 DIAGNOSIS — I739 Peripheral vascular disease, unspecified: Secondary | ICD-10-CM

## 2020-10-12 DIAGNOSIS — N186 End stage renal disease: Secondary | ICD-10-CM

## 2020-10-12 DIAGNOSIS — D72829 Elevated white blood cell count, unspecified: Secondary | ICD-10-CM

## 2020-10-12 MED ORDER — ATORVASTATIN CALCIUM 80 MG PO TABS
80.0000 mg | ORAL_TABLET | Freq: Every day | ORAL | Status: DC
  Administered 2020-10-12 – 2020-10-15 (×4): 80 mg via ORAL
  Filled 2020-10-12 (×4): qty 1

## 2020-10-12 NOTE — Plan of Care (Signed)
  Problem: RH Balance Goal: LTG Patient will maintain dynamic standing with ADLs (OT) Description: LTG:  Patient will maintain dynamic standing balance with assist during activities of daily living (OT)  Outcome: Not Applicable Flowsheets (Taken 10/12/2020 1002) LTG: Pt will maintain dynamic standing balance during ADLs with: (D/C) -- Note: D/C as pt not wearing prostheses due to breakdown on residual limb and awaiting new liners, therefore no standing recommended   Problem: Sit to Stand Goal: LTG:  Patient will perform sit to stand in prep for activites of daily living with assistance level (OT) Description: LTG:  Patient will perform sit to stand in prep for activites of daily living with assistance level (OT) Outcome: Not Applicable Flowsheets (Taken 10/12/2020 1002) LTG: PT will perform sit to stand in prep for activites of daily living with assistance level: (d/c) -- Note: D/C as pt not wearing prostheses due to breakdown on residual limb and awaiting new liners, therefore no standing recommended

## 2020-10-12 NOTE — Progress Notes (Signed)
Physical Therapy Session Note  Patient Details  Name: [name] MRN: [id number] Date of Birth: [birth year]  Today's Date: 10/12/2020 PT Individual Time: 1000-1055 and 1455-1528  PT Individual Time Calculation (min): 55 min and 33 min  Short Term Goals: Week 1:  PT Short Term Goal 1 (Week 1): pt will transfer stand<>pivot with LRAD CGA PT Short Term Goal 2 (Week 1): Pt will ambulate 65ft with LRAD CGA PT Short Term Goal 3 (Week 1): Pt will perform simulated car transfer with LRAD CGA  Skilled Therapeutic Interventions/Progress Updates:   Treatment Session 1: 1000-1055 55 min Received pt sitting in power WC, [name], from [name] present to deliver loaner power [name] and educate pt on features. Pt agreeable to therapy and did not state pain level during session. Session with emphasis on functional mobility/transfers, generalized strengthening, power WC mobility, discharge planning, and improved activity tolerance. Pt transferred old power WC<>bed<>new power WC via AP/PA transfers with CGA and cues for weight bearing through elbows only (however noted pt placing ~25% weight through LUE during first transfer). Educated pt on importance of turning off power chair when transferring due to tendency for power chair to move quickly if pt accidentally hits the control; pt verbalized understanding. Discussed use of safety features including seatbelt and reflectors and purposes and demonstration for the features of the power WC. Adjusted headrest for improved comfort/fit and reviewed features of power WC including adjusting seat height for ease with transfers, tilt for pressure relief, controls for footrests, hybrid cushion for skin integrity, and reclining backrest for comfort and pressure relief. Pt eager to test out loaner power WC and performed WC mobility 275ft x 1 trial and 443ft x 1 trial with supervision throughout session along hallways of rehab unit with emphasis on navigating turns,  adjusting speed controls, and navigating around objects/people. Pt performed WC mobility additional >563ft outside to entrance of [name] with supervision and cues for navigating tight spaces including elevators. Pt performed WC mobility 470ft outside along uneven concrete surfaces and up/downhill with supervision. Worked on Government social research officer and timing with pt pressing elevator button and maneuvering WC to get onto elevator quickly and safely x 2 trials with success on trial 2. On rehab unit pt performed WC mobility backwards 74ft x 2 trials with supervision and visual cues to follow green line on floor as target. Pt initially with increased difficulty controlling speeds and angles but demonstrated improvements on trial 2. Concluded session with pt sitting in power WC charging, needs within reach, and seatbelt on.   Treatment Session 2: 1455-1528 33 min Received pt sitting in power WC, pt agreeable to therapy and reported pain 7/10 in bilateral hands. RN notified and administered mediation during session. Session with emphasis on functional mobility/transfers, generalized strengthening, power WC mobility, and improved activity tolerance. Pt performed power WC mobility>1104ft x 2 trials to/from dayroom with supervision. Engaged in obstacle course with emphasis on navigating tight spaces and taking sharp turns weaving in/out of cones forward x 2 trials and backwards x 2 trials with supervision, increased time, and min cues for speed control. While weaving in/out of cones worked on dynamic sitting balance, reaching outside BOS, and fine motor control collecting horseshoes from hallway rails with supervision. Pt performed the following exercises sitting in [name] with supervision and verbal cues for technique: -bicep curls with 3lb wrist weights 2x15 -overhead shoulder press with 3lb wrist weights 2x12 -horizontal chest press at 90 degrees with 3lb wrist weights 2x12 Pt transferred power WC<>bed  via AP transfer with  CGA weight bearing through elbows and cues to ensure WC close enough to bed and legrests tucked underneath chair to minimize transfer distance. Sit<>supine with supervision. Concluded session with pt supine in bed, needs within reach, and bed alarm on.   Therapy Documentation Precautions:  Precautions Precautions: Fall Precaution Comments: bilateral prosthetics Restrictions Weight Bearing Restrictions: No Other Position/Activity Restrictions: MD and PA stated no weight bearing through hands but no formal order written  Therapy/Group: Individual Therapy [name] PT, DPT   10/12/2020, 7:23 AM

## 2020-10-12 NOTE — Progress Notes (Signed)
Patient ID: [name], male   DOB: [birth year], 59 y.o.   MRN: [id number]  Met with pt to discuss home health he had [facility] before and is prefers them again. He has a threshold to get over at his home and will need a ramp to get over it, so he can go to HD. He is connected with [facility] already. Has loaner power chair form [employer] and will take home at discharge until his is delivered.

## 2020-10-12 NOTE — Progress Notes (Addendum)
[name] PHYSICAL MEDICINE & REHABILITATION PROGRESS NOTE  Subjective/Complaints: Patient seen sitting up at the edge of his bed this morning.  Good sitting balance noted.  He states he slept well overnight.  He asks for me to evaluate his finger.  Discussed prosthesis.  Patient states that he was told it could take weeks.  Will follow up with prosthetics.  Discussed medications with covering physician.  ROS: Denies CP, SOB, N/V/D  Objective: Vital Signs: Blood pressure (!) 160/71, pulse 67, temperature 99.4 F (37.4 C), temperature source Oral, resp. rate 20, height 6\' 4"  (1.93 m), weight 83.3 kg, SpO2 100 %. No results found. Recent Labs    10/11/20 1443  WBC 11.6*  HGB 8.0*  HCT 27.1*  PLT 214   Recent Labs    10/11/20 1443  NA 133*  K 4.8  CL 92*  CO2 22  GLUCOSE 79  BUN 69*  CREATININE 11.28*  CALCIUM 8.8*    Intake/Output Summary (Last 24 hours) at 10/12/2020 0906 Last data filed at 10/11/2020 2300 Gross per 24 hour  Intake 240 ml  Output 2000 ml  Net -1760 ml        Physical Exam: BP (!) 160/71 (BP Location: Left Arm)   Pulse 67   Temp 99.4 F (37.4 C) (Oral)   Resp 20   Ht 6\' 4"  (1.93 m)   Wt 83.3 kg   SpO2 100%   BMI 22.35 kg/m  Constitutional: No distress . Vital signs reviewed. HENT: Normocephalic.  Atraumatic. Eyes: EOMI. No discharge. Cardiovascular: No JVD.  RRR. Respiratory: Normal effort.  No stridor.  Bilateral clear to auscultation. GI: Non-distended.  BS +. Skin: Warm and dry.  Bilateral hands with dressing CDI Right upper extremity with dry gangrene distal fifth digit > distal first digit. Right knee with patella and right lateral tibial ulcer Left distal stump lesion Psych: Flat.  Normal behavior. Musc: Bilateral hands with edema and tenderness. RUE: Amputation of digits 2-4 LUE: Amputation of second digit Bilateral BKA Neuro: Alert Motor: Makes eye contact with examiner.  Follows commands. Motor: Bilateral upper  extremities: Proximally 5/5, distally limited due to pain, unchanged Bilateral lower extremities: Hip flexion, knee extension 5/5, unchanged   Assessment/Plan: 1. Functional deficits secondary to PVD s/p amputation of digits in bilateral hands with history of bilateral BKA which require 3+ hours per day of interdisciplinary therapy in a comprehensive inpatient rehab setting.  Physiatrist is providing close team supervision and 24 hour management of active medical problems listed below.  Physiatrist and rehab team continue to assess barriers to discharge/monitor patient progress toward functional and medical goals   Care Tool:  Bathing  Bathing activity did not occur: Refused Body parts bathed by patient: Right arm, Left arm, Chest, Abdomen, Front perineal area, Right upper leg, Left upper leg, Face, Left lower leg, Right lower leg         Bathing assist Assist Level: Minimal Assistance - Patient > 75%     Upper Body Dressing/Undressing Upper body dressing   What is the patient wearing?: Pull over shirt    Upper body assist Assist Level: Set up assist    Lower Body Dressing/Undressing Lower body dressing      What is the patient wearing?: Pants     Lower body assist Assist for lower body dressing: Moderate Assistance - Patient 50 - 74%     Toileting Toileting Toileting Activity did not occur (Clothing management and hygiene only): N/A (no void or bm)  Toileting  assist Assist for toileting: Minimal Assistance - Patient > 75%     Transfers Chair/bed transfer  Transfers assist     Chair/bed transfer assist level: Supervision/Verbal cueing     Locomotion Ambulation   Ambulation assist   Ambulation activity did not occur: Safety/medical concerns (per MD/PA no standing on prosthetics until wound care comes to assess pt)  Assist level: Minimal Assistance - Patient > 75% Assistive device: Walker-platform Max distance: 10'   Walk 10 feet activity   Assist   Walk 10 feet activity did not occur: Safety/medical concerns (per MD/PA no standing on prosthetics until wound care comes to assess pt)        Walk 50 feet activity   Assist Walk 50 feet with 2 turns activity did not occur: Safety/medical concerns (per MD/PA no standing on prosthetics until wound care comes to assess pt)         Walk 150 feet activity   Assist Walk 150 feet activity did not occur: Safety/medical concerns (per MD/PA no standing on prosthetics until wound care comes to assess pt)         Walk 10 feet on uneven surface  activity   Assist Walk 10 feet on uneven surfaces activity did not occur: Safety/medical concerns (per MD/PA no standing on prosthetics until wound care comes to assess pt)         Wheelchair     Assist Will patient use wheelchair at discharge?: Yes Type of Wheelchair: Power Wheelchair activity did not occur: Safety/medical concerns (not able to bear weight through hands and did not don R prosthetic due to wound)  Wheelchair assist level: Supervision/Verbal cueing Max wheelchair distance: 150'    Wheelchair 50 feet with 2 turns activity    Assist    Wheelchair 50 feet with 2 turns activity did not occur: Safety/medical concerns (not able to bear weight through hands and did not don R prosthetic due to wound)   Assist Level: Supervision/Verbal cueing   Wheelchair 150 feet activity     Assist  Wheelchair 150 feet activity did not occur: Safety/medical concerns (not able to bear weight through hands and did not don R prosthetic due to wound)   Assist Level: Supervision/Verbal cueing    Medical Problem List and Plan: 1.  Decreased functional mobility with failure to thrive secondary to PVD with history of bilateral BKA as well as status post amputation of multiple fingers critical limb ischemia.  Status post I&D of both hands 10/01/2020 per Dr. Burney Gauze. Orthopedic services advises to minimize weightbearing through hand  as much as possible.  Continue CIR 2.  Antithrombotics: -DVT/anticoagulation: Subcutaneous heparin 5000U q8H              -antiplatelet therapy: N/A 3. Pain Management: Lyrica 100 mg nightly, oxycodone as needed             Monitor with increased exertion  Added Tylenol TID 11/5  Increase cymbalta to 30mg  11/7 4. Mood: Provide emotional support             -antipsychotic agents: N/A 5. Neuropsych: This patient is capable of making decisions on his own behalf. 6. Skin/Wound Care: Routine skin checks with wound care nurse follow-up  Discussed with [employer]-lower extremity prosthesis not to be worn until adjustments made due to worsening ulcers  Awaiting new liners - will follow up with prosthetist 7. Fluids/Electrolytes/Nutrition: Routine and outs 8.  ID.  Wound culture left hand Proteus penneri.  Continue Bactrim for 2 more weeks  from 10/04/2020.  Follow-up outpatient infectious disease clinic 9.  ESRD.  Continue hemodialysis as directed             Recs per nephro 10.  Anemia of chronic disease.  Continue Aranesp.               Hemoglobin 8.0 on 11/8 11.  Hypertension.  Norvasc 10 mg daily, Coreg as directed, hydralazine 25 mg every 8 hours.               Labile on 11/9 12.  Hyperlipidemia/severe PVD: Statin increased to high-dose after discussion with pharmacy on 11/9 13. Insomnia: start melatonin 3mg  HS on 11/6  Improving 14.  Leukocytosis  WBCs 11.6 on 11/8  Afebrile  Continue to monitor  LOS: 6 days A FACE TO FACE EVALUATION WAS PERFORMED  Delroy Ordway Lorie Phenix 10/12/2020, 9:06 AM

## 2020-10-12 NOTE — Plan of Care (Signed)
  Problem: Sit to Stand Goal: LTG:  Patient will perform sit to stand with assistance level (PT) Description: LTG:  Patient will perform sit to stand with assistance level (PT) Outcome: Not Applicable Flowsheets (Taken 10/12/2020 0757) LTG: PT will perform sit to stand in preparation for functional mobility with assistance level: (D/C) --   Problem: RH Car Transfers Goal: LTG Patient will perform car transfers with assist (PT) Description: LTG: Patient will perform car transfers with assistance (PT). Outcome: Not Applicable Flowsheets (Taken 10/12/2020 0757) LTG: Pt will perform car transfers with assist:: (D/C) -- Note: D/C   Problem: RH Ambulation Goal: LTG Patient will ambulate in controlled environment (PT) Description: LTG: Patient will ambulate in a controlled environment, # of feet with assistance (PT). Outcome: Not Applicable Flowsheets (Taken 10/12/2020 0757) LTG: Pt will ambulate in controlled environ  assist needed:: (D/C) -- Note: D/C Goal: LTG Patient will ambulate in home environment (PT) Description: LTG: Patient will ambulate in home environment, # of feet with assistance (PT). Outcome: Not Applicable Flowsheets (Taken 10/12/2020 0757) LTG: Pt will ambulate in home environ  assist needed:: (D/C) -- Note: D/C   Problem: RH Stairs Goal: LTG Patient will ambulate up and down stairs w/assist (PT) Description: LTG: Patient will ambulate up and down # of stairs with assistance (PT) Outcome: Not Applicable Flowsheets (Taken 10/12/2020 0757) LTG: Pt will ambulate up/down stairs assist needed:: (D/C) -- Note: D/C

## 2020-10-12 NOTE — Plan of Care (Signed)
  Problem: RH Balance Goal: LTG Patient will maintain dynamic standing balance (PT) Description: LTG:  Patient will maintain dynamic standing balance with assistance during mobility activities (PT) Outcome: Not Applicable Flowsheets (Taken 10/12/2020 0758) LTG: Pt will maintain dynamic standing balance during mobility activities with:: (D/C) -- Note: D/C

## 2020-10-12 NOTE — Progress Notes (Signed)
Occupational Therapy Session Note  Patient Details  Name: [name] MRN: [id number] Date of Birth: [birth year]  Today's Date: 10/12/2020 OT Individual Time: 7169-6789 and 3810-1751 OT Individual Time Calculation (min): 43 min and 55 min   Short Term Goals: Week 1:  OT Short Term Goal 1 (Week 1): STGs equal to LTGs set at supervison level overall.  Skilled Therapeutic Interventions/Progress Updates:    1) Treatment session with focus on self-care retraining and functional transfers.  Pt received seated EOB reporting "soreness" in LUE at HD site and just feeling somewhat "unwell" this morning. Discussed current level with self-care tasks of bathing and dressing, as pt has not performed these tasks with consistency during therapy sessions.  Pt states that sometimes his wife assists him and other times he is able to complete without assistance.  Discussed plan to complete bathing and dressing during early AM session tomorrow with pt in agreement.  Discussed home setup and previous AD/DME used at home. Pt utilized Rollator for mobility and would have to side step in to bathroom due to narrow doorway.  Therapist reiterated use of BSC next to bed at this time until medically cleared to don prostheses and ambulate to bathroom.  Pt completed posterior transfer to power w/c with setup from therapist and close supervision.  Pt demonstrating good awareness of speed settings in tight room, with one instance of toggling too high and running arm rests in to counter due to high speed. Therapist reiterated increased awareness of speed setting and turning power off when not moving to decrease risk of activating throttle and moving w/c.  Pt completed grooming tasks and UB bathing and dressing with increased time but no physical assistance.  Discussed alternative shirts to increase success as pt with challenge donning cuffed long-sleeve shirt over bandages on hands.  Pt remained upright in power w/c with all needs in  reach.  2) Treatment session with focus on w/c mobility and home and community access.  Pt received upright in power w/c on the phone with friend asking for assistance in regards to building or obtaining ramp for threshold in to home.  Therapist provided pictures of various types of prefabricated ramps as examples and discussed appropriate ratio of height of threshold to length of ramp.  Pt completed ramp navigation in ortho gym x2 with cues for speed and navigation.  Pt navigated on/off elevator and over community thresholds with good awareness of obstacles and changing speed setting to improve fluidity of movements.  Discussed choice of home entrances as pt reports backdoor may be a smaller threshold and more easily accessed.  Pt to further discuss with wife.  Pt returned to room and planned to call some more friends/family who might be able to assist with arranging from ramp in to home.  Pt remained upright in w/c with all needs in reach.  Therapy Documentation Precautions:  Precautions Precautions: Fall Precaution Comments: bilateral prosthetics Restrictions Weight Bearing Restrictions: No Other Position/Activity Restrictions: MD and PA stated no weight bearing through hands but no formal order written Pain: Pt with c/o pain B hands and reports "soreness" in LUE at HD site.  Premedicated.   Therapy/Group: Individual Therapy  Simonne Come 10/12/2020, 9:55 AM

## 2020-10-13 ENCOUNTER — Inpatient Hospital Stay (HOSPITAL_COMMUNITY): Payer: Medicare Other | Admitting: Occupational Therapy

## 2020-10-13 ENCOUNTER — Inpatient Hospital Stay (HOSPITAL_COMMUNITY): Payer: Medicare Other

## 2020-10-13 DIAGNOSIS — I10 Essential (primary) hypertension: Secondary | ICD-10-CM

## 2020-10-13 DIAGNOSIS — G479 Sleep disorder, unspecified: Secondary | ICD-10-CM

## 2020-10-13 LAB — RENAL FUNCTION PANEL
Albumin: 2.8 g/dL — ABNORMAL LOW (ref 3.5–5.0)
Anion gap: 15 (ref 5–15)
BUN: 50 mg/dL — ABNORMAL HIGH (ref 8–23)
CO2: 24 mmol/L (ref 22–32)
Calcium: 9 mg/dL (ref 8.9–10.3)
Chloride: 93 mmol/L — ABNORMAL LOW (ref 98–111)
Creatinine, Ser: 9.15 mg/dL — ABNORMAL HIGH (ref 0.61–1.24)
GFR, Estimated: 6 mL/min — ABNORMAL LOW (ref >60)
Glucose, Bld: 113 mg/dL — ABNORMAL HIGH (ref 70–99)
Phosphorus: 6.1 mg/dL — ABNORMAL HIGH (ref 2.5–4.6)
Potassium: 4.5 mmol/L (ref 3.5–5.1)
Sodium: 132 mmol/L — ABNORMAL LOW (ref 135–145)

## 2020-10-13 LAB — CBC
HCT: 28.2 % — ABNORMAL LOW (ref 39.0–52.0)
Hemoglobin: 8.4 g/dL — ABNORMAL LOW (ref 13.0–17.0)
MCH: 22.8 pg — ABNORMAL LOW (ref 26.0–34.0)
MCHC: 29.8 g/dL — ABNORMAL LOW (ref 30.0–36.0)
MCV: 76.6 fL — ABNORMAL LOW (ref 80.0–100.0)
Platelets: 184 10*3/uL (ref 150–400)
RBC: 3.68 MIL/uL — ABNORMAL LOW (ref 4.22–5.81)
RDW: 19.5 % — ABNORMAL HIGH (ref 11.5–15.5)
WBC: 10.3 10*3/uL (ref 4.0–10.5)
nRBC: 0 % (ref 0.0–0.2)

## 2020-10-13 MED ORDER — CALCITRIOL 1 MCG/ML IV SOLN
INTRAVENOUS | Status: AC
  Filled 2020-10-13: qty 2

## 2020-10-13 MED ORDER — CALCITRIOL 0.25 MCG PO CAPS
0.5000 ug | ORAL_CAPSULE | Freq: Once | ORAL | Status: AC
  Administered 2020-10-13: 0.5 ug via ORAL
  Filled 2020-10-13: qty 2

## 2020-10-13 MED ORDER — LIDOCAINE-PRILOCAINE 2.5-2.5 % EX CREA: 1.0000 "application " | TOPICAL_CREAM | CUTANEOUS | Status: DC | PRN

## 2020-10-13 MED ORDER — SODIUM CHLORIDE 0.9 % IV SOLN: 100.0000 mL | INTRAVENOUS | Status: DC | PRN

## 2020-10-13 MED ORDER — HEPARIN SODIUM (PORCINE) 1000 UNIT/ML DIALYSIS: 1000.0000 [IU] | INTRAMUSCULAR | Status: DC | PRN

## 2020-10-13 MED ORDER — LIDOCAINE HCL (PF) 1 % IJ SOLN: 5.0000 mL | INTRAMUSCULAR | Status: DC | PRN

## 2020-10-13 MED ORDER — OXYCODONE HCL ER 10 MG PO T12A
10.0000 mg | EXTENDED_RELEASE_TABLET | Freq: Two times a day (BID) | ORAL | Status: DC
  Administered 2020-10-13 – 2020-10-16 (×7): 10 mg via ORAL
  Filled 2020-10-13 (×7): qty 1

## 2020-10-13 MED ORDER — PENTAFLUOROPROP-TETRAFLUOROETH EX AERO: 1.0000 "application " | INHALATION_SPRAY | CUTANEOUS | Status: DC | PRN

## 2020-10-13 MED ORDER — ALTEPLASE 2 MG IJ SOLR: 2.0000 mg | Freq: Once | INTRAMUSCULAR | Status: DC | PRN

## 2020-10-13 NOTE — Progress Notes (Signed)
Physical Therapy Session Note  Patient Details  Name: [name] MRN: [id number] Date of Birth: 1961-05-01  Today's Date: 10/13/2020 PT Individual Time: 0900-1011 PT Individual Time Calculation (min): 71 min   Short Term Goals: Week 1:  PT Short Term Goal 1 (Week 1): pt will transfer stand<>pivot with LRAD CGA PT Short Term Goal 2 (Week 1): Pt will ambulate 69ft with LRAD CGA PT Short Term Goal 3 (Week 1): Pt will perform simulated car transfer with LRAD CGA  Skilled Therapeutic Interventions/Progress Updates:   Received pt sitting in power WC with MD present assessing wounds, pt agreeable to therapy, and did not state pain level but reported receiving pain medication this morning. Session with emphasis on functional mobility/transfers, generalized strengthening, power WC mobility, dynamic sitting balance/coordination, and improved activity tolerance. Discussed getting grounds pass with MD and per MD, still no donning prosthetics for wound healing. Therapist wrapped bilateral hands with gauze and ace wraps total A and pt performed power WC mobility 226ft with supervision to laundry room and therapist switched laundry from washer to dryer. Pt performed power WC mobility additional 48ft with supervision to therapy gym at increased speed demonstrating improvements in ability to control power WC at a higher cadence. Per pt, ramp to be delivered Thursday or Friday to home. Pt transferred power WC<>mat via AP transfer with CGA and cues to tuck legrests underneath chair to get power WC at close as possible to mat to minimize transfer distance. Pt transferred long sit<>semi reclined on wedge with supervision and performed the following exercises semi-reclined on wedge with supervision and verbal cues for technique: -bridges on bolster 3x10 -SAQ 3x12 bilaterally -hip abduction with grn TB 3x10 -hip adduction ball squeezes 3x12  -single arm tricep extension 3x8 bilaterally with 4lb wrist weight on  RUE and un-weighted on LUE due to stent -chest press with 4lb weight on RUE and un-weighted on LUE due to stent 3x12 -crunches 2x6 with therapist stabilizing legs for balance and proprioception Pt reported 8/10 fatigue after exercises and transferred semi-reclined<>long sitting and from mat<>power WC via PA transfer with CGA with emphasis on weight bearing through elbows only. Pt performed power WC mobility additional 252ft with supervision to laundry room to collect clothes and then back to room. Pt requested to rest in bed and transferred power WC<>bed via AP transfer with CGA and sit<>supine with supervision. Pt able to plug in power WC to charge with set up assist. Therapist folded and put away laundry. Concluded session with pt supine in bed, needs within reach, and bed alarm on.   Therapy Documentation Precautions:  Precautions Precautions: Fall Precaution Comments: bilateral prosthetics Restrictions Weight Bearing Restrictions: No Other Position/Activity Restrictions: MD and PA stated no weight bearing through hands but no formal order written   Therapy/Group: Individual Therapy Alfonse Alpers PT, DPT   10/13/2020, 7:32 AM

## 2020-10-13 NOTE — Progress Notes (Signed)
Orthopedic Tech Progress Note Patient Details:  [name] [birth year] [id number] Called in order to [employer] for BLE AMPUTEE NEEDING MODIFICATION TO PROSTHESIS  Patient ID: [name], male   DOB: July 20, 1961, 59 y.o.   MRN: [id number]   Janit Pagan 10/13/2020, 9:46 AM

## 2020-10-13 NOTE — Progress Notes (Signed)
Patient ID: [name], male   DOB: 08/11/1961, 59 y.o.   MRN: [id number]  Met with pt to discuss team conference goals-mod/i wheelchair level and discharge date 11/13. To make sure his ramp has been delivered and ready for him to go to HD on Monday. He is in agreement with this and will work toward this date. Have referred him to [facility] since he had them before and liked them. Has loaner power chair to take home with him.  

## 2020-10-13 NOTE — Progress Notes (Signed)
Physical Therapy Session Note  Patient Details  Name: [name] MRN: [id number] Date of Birth: January 17, 1961  Today's Date: 10/13/2020 PT Individual Time: 1100-1157 PT Individual Time Calculation (min): 57 min   Short Term Goals: Week 1:  PT Short Term Goal 1 (Week 1): pt will transfer stand<>pivot with LRAD CGA PT Short Term Goal 2 (Week 1): Pt will ambulate 69ft with LRAD CGA PT Short Term Goal 3 (Week 1): Pt will perform simulated car transfer with LRAD CGA  Skilled Therapeutic Interventions/Progress Updates:    Patient received sitting up in bed, agreeable to PT. He reports 9/10 pain in B hands, R >L. RN alerted and patient received rx at beginning of session. He was able to transfer to wc via posterior scoot using elbows to prop up on wc armrests. Minimal weight applied through B hands. Patient able to safely maneuver wc to go outside to help with mood elevation. Patient able to demonstrate safely toggling speed and adjusting how reclined wc is to be able to drive outside. Upon returning inside, patient able to negotiate wc around cones making tight turns to avoid obstacles. Patient returning to room in wc, transferring back to bed via AP scoot. Bed alarm on, call light within reach.  Therapy Documentation Precautions:  Precautions Precautions: Fall Precaution Comments: bilateral prosthetics Restrictions Weight Bearing Restrictions: No Other Position/Activity Restrictions: MD and PA stated no weight bearing through hands but no formal order written    Therapy/Group: Individual Therapy  Karoline Caldwell, PT, DPT, CBIS  10/13/2020, 7:34 AM

## 2020-10-13 NOTE — Progress Notes (Signed)
[facility] Progress Note   Subjective:   Seen in room, up in electric w/c. No complaints today.  For dialysis today   Objective Vitals:   10/12/20 1731 10/12/20 1922 10/13/20 0416 10/13/20 0722  BP: (!) 145/74 (!) 150/80 (!) 167/80 (!) 146/75  Pulse: 67 71 70 72  Resp:  16 18   Temp:  98.5 F (36.9 C) 98.4 F (36.9 C)   TempSrc:  Oral Oral   SpO2:  98% 100%   Weight:      Height:       Physical Exam General: Pleasant male in NAD Heart: S1,S2 RRR No M/R/G Lungs: CTAB Abdomen: S, NT active BS Extremities: Bilateral BKA no stump edema. Ace wrap both hands. Drsg CDI Dialysis Access: L AVF + bruit   Additional Objective Labs: Basic Metabolic Panel: Recent Labs  Lab 10/08/20 1433 10/11/20 1443  NA 133* 133*  K 4.2 4.8  CL 93* 92*  CO2 24 22  GLUCOSE 102* 79  BUN 60* 69*  CREATININE 9.85* 11.28*  CALCIUM 8.9 8.8*  PHOS 7.3* 7.3*   Liver Function Tests: Recent Labs  Lab 10/08/20 1433 10/11/20 1443  ALBUMIN 2.7* 2.8*   No results for input(s): LIPASE, AMYLASE in the last 168 hours. CBC: Recent Labs  Lab 10/08/20 1433 10/11/20 1443  WBC 9.9 11.6*  HGB 7.8* 8.0*  HCT 25.9* 27.1*  MCV 75.1* 76.3*  PLT 224 214   Blood Culture    Component Value Date/Time   SDES WOUND 10/01/2020 1140   SPECREQUEST LEFT HAND SPEC A 10/01/2020 1140   CULT  10/01/2020 1140    FEW PROTEUS PENNERI FEW MORGANELLA MORGANII NO ANAEROBES ISOLATED Performed at [facility], [employer] [address], [place], [place] [address]    REPTSTATUS 10/06/2020 FINAL 10/01/2020 1140    Cardiac Enzymes: No results for input(s): CKTOTAL, CKMB, CKMBINDEX, TROPONINI in the last 168 hours. CBG: No results for input(s): GLUCAP in the last 168 hours. Iron Studies:  No results for input(s): IRON, TIBC, TRANSFERRIN, FERRITIN in the last 72 hours. @lablastinr3 @ Studies/Results: No results found. Medications: . iron sucrose     . acetaminophen  650 mg Oral TID   Or  .  acetaminophen  650 mg Rectal TID  . amLODipine  10 mg Oral Daily  . atorvastatin  80 mg Oral QHS  . calcitRIOL  1.8 mcg Intravenous Q M,W,F-HD  . carvedilol  12.5 mg Oral Q breakfast  . carvedilol  12.5 mg Oral Once per day on Sun Tue Thu Sat  . Chlorhexidine Gluconate Cloth  6 each Topical Q0600  . darbepoetin (ARANESP) injection - DIALYSIS  200 mcg Intravenous Q Mon-HD  . docusate sodium  100 mg Oral BID  . DULoxetine  30 mg Oral Daily  . heparin  5,000 Units Subcutaneous Q8H  . hydrALAZINE  50 mg Oral Q8H  . melatonin  3 mg Oral QHS  . multivitamin  1 tablet Oral Q M,W,F-HD  . pregabalin  100 mg Oral QHS  . sevelamer carbonate  3,200 mg Oral TID WC  . sulfamethoxazole-trimethoprim  1 tablet Oral Daily  . white petrolatum   Topical BID   Dialysis Orders: GO MWF 4h 77min 200NRe 425/800 EDW 84kg 2K/2Ca L AVF No heparin  Venofer 100mg  IV x 10 (until 11/15) Mircera 150 q 2 weeks (last 10/15)  Calcitriol 1.75 TIW  Assessment/Plan: 1. PVD/L hand ischemia/infection. S/p recent amp L index finger.S/pOR washout on 10/29.Now in CIR. Planning for discharge Friday  per patient  3. ESRD -HD MWF.Next HD 11/10 4. Hypertension/volume - Continues to be hypertensive despite lowering wt. Hydralazine increased to 50mg  TID. Will have Lower EDW on DC.  Volume ok.  5. Anemia - Hgb8.0Aranesp increased 200 q Monday. Tsat 26% Load with load with iron X 5 doses with HD 6. Metabolic bone disease -Calcium controlled.PO4 elevated, but down trending after increase in binders.  7. DMT2 -per primary  Lynnda Child PA-C [facility] 10/13/2020,9:15 AM

## 2020-10-13 NOTE — Progress Notes (Signed)
[place] PHYSICAL MEDICINE & REHABILITATION PROGRESS NOTE  Subjective/Complaints: Patient seen sitting up in his chair this AM.  He states he slept well overnight.  Discussed grounds pass with therapies and patient.  Continue to await liners.  He states pain medications are not working.  ROS: Denies CP, SOB, N/V/D  Objective: Vital Signs: Blood pressure (!) 146/75, pulse 72, temperature 98.4 F (36.9 C), temperature source Oral, resp. rate 18, height 6\' 4"  (1.93 m), weight 83.3 kg, SpO2 100 %. No results found. Recent Labs    10/11/20 1443  WBC 11.6*  HGB 8.0*  HCT 27.1*  PLT 214   Recent Labs    10/11/20 1443  NA 133*  K 4.8  CL 92*  CO2 22  GLUCOSE 79  BUN 69*  CREATININE 11.28*  CALCIUM 8.8*    Intake/Output Summary (Last 24 hours) at 10/13/2020 0925 Last data filed at 10/13/2020 0914 Gross per 24 hour  Intake 480 ml  Output 200 ml  Net 280 ml        Physical Exam: BP (!) 146/75   Pulse 72   Temp 98.4 F (36.9 C) (Oral)   Resp 18   Ht 6\' 4"  (1.93 m)   Wt 83.3 kg   SpO2 100%   BMI 22.35 kg/m  Constitutional: No distress . Vital signs reviewed. HENT: Normocephalic.  Atraumatic. Eyes: EOMI. No discharge. Cardiovascular: No JVD.  RRR. Respiratory: Normal effort.  No stridor.  Bilateral clear to auscultation. GI: Non-distended.  BS +. Skin: Warm and dry.   Right hand with dorsal area above 2-3 MCP joints with mild serosanginous drainage Left hand with dorsal area above second MCP joint with serosanginous drainage and sutures Right upper extremity with dry gangrene distal fifth digit > distal first digit, unchanged. Right knee with patella and right lateral tibial DTI Left distal stump with callous and verrucous hyperplasia  Psych: Flat. Normal behavior. MSK: Bilateral BKA B/l hands with edema and tenderness Neuro: Alert Motor: Makes eye contact with examiner.  Follows commands. Motor: Bilateral upper extremities: Proximally 5/5, distally  limited due to pain, unchanged Bilateral lower extremities: Hip flexion, knee extension 5/5, stable  Assessment/Plan: 1. Functional deficits secondary to PVD s/p amputation of digits in bilateral hands with history of bilateral BKA which require 3+ hours per day of interdisciplinary therapy in a comprehensive inpatient rehab setting.  Physiatrist is providing close team supervision and 24 hour management of active medical problems listed below.  Physiatrist and rehab team continue to assess barriers to discharge/monitor patient progress toward functional and medical goals   Care Tool:  Bathing  Bathing activity did not occur: Refused Body parts bathed by patient: Right arm, Left arm, Chest, Abdomen, Front perineal area, Buttocks, Right upper leg, Left upper leg, Face         Bathing assist Assist Level: Set up assist     Upper Body Dressing/Undressing Upper body dressing   What is the patient wearing?: Pull over shirt    Upper body assist Assist Level: Set up assist    Lower Body Dressing/Undressing Lower body dressing      What is the patient wearing?: Pants, Underwear/pull up     Lower body assist Assist for lower body dressing: Supervision/Verbal cueing     Toileting Toileting Toileting Activity did not occur (Clothing management and hygiene only): N/A (no void or bm)  Toileting assist Assist for toileting: Minimal Assistance - Patient > 75%     Transfers Chair/bed transfer  Transfers assist  Chair/bed transfer assist level: Supervision/Verbal cueing     Locomotion Ambulation   Ambulation assist   Ambulation activity did not occur: Safety/medical concerns (per MD/PA no standing on prosthetics until wound care comes to assess pt)  Assist level: Minimal Assistance - Patient > 75% Assistive device: Walker-platform Max distance: 10'   Walk 10 feet activity   Assist  Walk 10 feet activity did not occur: Safety/medical concerns (per MD/PA no  standing on prosthetics until wound care comes to assess pt)        Walk 50 feet activity   Assist Walk 50 feet with 2 turns activity did not occur: Safety/medical concerns (per MD/PA no standing on prosthetics until wound care comes to assess pt)         Walk 150 feet activity   Assist Walk 150 feet activity did not occur: Safety/medical concerns (per MD/PA no standing on prosthetics until wound care comes to assess pt)         Walk 10 feet on uneven surface  activity   Assist Walk 10 feet on uneven surfaces activity did not occur: Safety/medical concerns (per MD/PA no standing on prosthetics until wound care comes to assess pt)         Wheelchair     Assist Will patient use wheelchair at discharge?: Yes Type of Wheelchair: Manual Wheelchair activity did not occur: Safety/medical concerns (not able to bear weight through hands and did not don R prosthetic due to wound)  Wheelchair assist level: Supervision/Verbal cueing Max wheelchair distance: >535ft    Wheelchair 50 feet with 2 turns activity    Assist    Wheelchair 50 feet with 2 turns activity did not occur: Safety/medical concerns (not able to bear weight through hands and did not don R prosthetic due to wound)   Assist Level: Supervision/Verbal cueing   Wheelchair 150 feet activity     Assist  Wheelchair 150 feet activity did not occur: Safety/medical concerns (not able to bear weight through hands and did not don R prosthetic due to wound)   Assist Level: Supervision/Verbal cueing    Medical Problem List and Plan: 1.  Decreased functional mobility with failure to thrive secondary to severe PVD with history of bilateral BKA as well as status post amputation of multiple fingers critical limb ischemia.  Status post I&D of both hands 10/01/2020 per Dr. Burney Gauze. Orthopedic services advises to minimize weightbearing through hand as much as possible.  Continue CIR  Team conference today to  discuss current and goals and coordination of care, home and environmental barriers, and discharge planning with nursing, case manager, and therapies. Please see conference note from today as well.  2.  Antithrombotics: -DVT/anticoagulation: Subcutaneous heparin 5000U q8H              -antiplatelet therapy: N/A 3. Pain Management: Lyrica 100 mg nightly, oxycodone as needed             Monitor with increased exertion  Added Tylenol TID 11/5  Increased cymbalta to 30mg  11/7  Oxycontin 10 every 12 started on 11/10 4. Mood: Provide emotional support             -antipsychotic agents: N/A 5. Neuropsych: This patient is capable of making decisions on his own behalf. 6. Skin/Wound Care: Routine skin checks with wound care nurse follow-up  Discussed with [employer]-lower extremity prosthesis not to be worn until adjustments made due to worsening ulcers, continue to await new liner, which is delaying his ability to wear  prosthesis and therefore transfer education/therapies 7. Fluids/Electrolytes/Nutrition: Routine and outs 8.  ID.  Wound culture left hand Proteus penneri.  Continue Bactrim for 2 more weeks from 10/04/2020.  Follow-up outpatient infectious disease clinic 9.  ESRD.  Continue hemodialysis as directed             Recs per nephro 10.  Anemia of chronic disease.  Continue Aranesp.               Hemoglobin 8.0 on 11/8 11.  Hypertension.  Norvasc 10 mg daily, Coreg as directed, hydralazine 25 mg every 8 hours.               Slightly elevated on 11/10 12.  Hyperlipidemia/severe PVD: Statin increased to high-dose after discussion with pharmacy on 11/9 13.  Sleep disturbance: Started melatonin 3mg  HS on 11/6  Improving 14.  Leukocytosis  WBCs 11.6 on 11/8, labs with HD-await trend  Afebrile  Continue to monitor  LOS: 7 days A FACE TO FACE EVALUATION WAS PERFORMED  Maziyah Vessel Lorie Phenix 10/13/2020, 9:25 AM

## 2020-10-13 NOTE — Progress Notes (Signed)
Occupational Therapy Session Note  Patient Details  Name: [name] MRN: [id number] Date of Birth: Feb 16, 1961  Today's Date: 10/13/2020 OT Individual Time: 0732-0830 OT Individual Time Calculation (min): 58 min    Short Term Goals: Week 1:  OT Short Term Goal 1 (Week 1): STGs equal to LTGs set at supervison level overall.  Skilled Therapeutic Interventions/Progress Updates:    Treatment session with focus on self-care retraining and functional transfers and mobility.  Pt received upright in bed finishing breakfast.  Engaged in bathing and dressing with focus on increased independence with LB bathing/dressing.  Pt completed LB bathing with lateral leans at EOB and required return to semi-reclined to completely advance pants over hips.  Pt able to complete bathing and dressing with setup of items and supervision for LB dressing.  Pt completed posterior transfer bed > power w/c with setup and supervision.  Pt gathered clothing from w/c level and navigated to laundry room with power w/c.  Pt independently toggling through speed settings for appropriate environment.  Laundry started with assistance from therapist, as pt reports that he does not do the laundry at home.  Pt reports wife ordered ramp from home and it should arrive Thurs or Fri of this week.  Pt returned to room, navigating obstacles Mod I.  Pt remained upright in power w/c with all needs in reach.  Therapy Documentation Precautions:  Precautions Precautions: Fall Precaution Comments: bilateral prosthetics Restrictions Weight Bearing Restrictions: No Other Position/Activity Restrictions: MD and PA stated no weight bearing through hands but no formal order written General:   Vital Signs: Therapy Vitals Pulse Rate: 72 BP: (!) 146/75 Pain: Pain Assessment Pain Scale: 0-10 Pain Score: 8  Pain Type: Surgical pain Pain Location: Hand Pain Orientation: Right;Left Pain Descriptors / Indicators: Aching Pain Frequency:  Constant Pain Onset: On-going Patients Stated Pain Goal: 2 Pain Intervention(s): Medication (See eMAR)   Therapy/Group: Individual Therapy  Kimyetta Flott, [place] 10/13/2020, 8:30 AM

## 2020-10-13 NOTE — Patient Care Conference (Signed)
Inpatient RehabilitationTeam Conference and Plan of Care Update Date: 10/13/2020   Time: 11:30 AM   Patient Name: [name]      Medical Record Number: [id number]  Date of Birth: 26-Mar-1961 Sex: Male         Room/Bed: 4M11C/4M11C-01 Payor Info: Payor: MEDICARE / Plan: MEDICARE PART A AND B / Product Type: *No Product type* /    Admit Date/Time:  10/06/2020  6:09 PM  Primary Diagnosis:  Finger amputee  Hospital Problems: Principal Problem:   Finger amputee Active Problems:   S/P bilateral below knee amputation ([place])   Labile blood pressure   Dyslipidemia   Sleep disturbance   Hypertension    Expected Discharge Date: Expected Discharge Date: 10/16/20 (pending ramp installation)  Team Members Present: Physician leading conference: Dr. Delice Lesch Care Coodinator Present: Dorien Chihuahua, RN, BSN, CRRN;Becky Dupree, LCSW Nurse Present: Suella Grove, RN PT Present: Becky Sax, PT OT Present: Simonne Come, OT PPS Coordinator present : Ileana Ladd, Burna Mortimer, SLP     Current Status/Progress Goal Weekly Team Focus  Bowel/Bladder   Patient is anuric; last BM 11/8  Remain continent of bowels  Assess every shift and as needed   Swallow/Nutrition/ Hydration             ADL's   Setup assist bathing and dressing at bed level, A/P and lateral scoot transfers CGA  Supervision overall  A/P transfers, problem solving mobility and transfers while maintaining limited WB through [place], d/c planning   Mobility   bed mobility supervision, transfers CGA, no gait/standing due to wounds, power WC mobility 155ft supervision  Mod I  functional mobility/transfers, generalized strengthening, power WC mobility, and improved activity tolerance.   Communication             Safety/Cognition/ Behavioral Observations            Pain   Pain reported as 9/10 during shift, prn given per MAR  Pain <5/10  Assess every shift and as needed   Skin   Surgical incisions to bilateral hands,  calus to RLE  Prevent further breakdown to RLE  Assess every shift and as needed     Discharge Planning:  Home with wife who is there but unsure how much assist she will provide. He needs a ramp over the threshold to be able to get into home and go back and forth to HD   Team Discussion: [facility] evaluation of prosthesis/adjustment pending. BP slightly elevated, WBC trending upward; MD monitoring. . Pain from incision; MD adjusted medications. Continent of bowel and bladder; HD scheduled later today.  Patient on target to meet rehab goals: yes, mod I goals set except supervision for bathing and dressing. Currently supervision for AP transfers.  *See Care Plan and progress notes for long and short-term goals.   Revisions to Treatment Plan:  PT/OT discontinued ambulation goalsl and shower goals as anticipate patient will not be ambulatory at discharge. Teaching Needs: Transfers, toileting, medications, wound care, etc.  Current Barriers to Discharge: Home enviroment access/layout and Hemodialysis  Possible Resolutions to Barriers: Ramp overlay for entry to home and access to [facility] transport to/from HD sessions. HH PT and OT recommended     Medical Summary Current Status: Decreased functional mobility with failure to thrive secondary to severe PVD with history of bilateral BKA as well as status post amputation of multiple fingers critical limb ischemia.  Status post I&D of both hands 10/01/2020 per Dr. Burney Gauze. Orthopedic services advises to minimize weightbearing  through hand as much as possible  Barriers to Discharge: Medical stability;Wound care;Other (comments);Hemodialysis;Pending Surgery  Barriers to Discharge Comments: Dry gangrene Possible Resolutions to Barriers/Weekly Focus: Therapies, optimize pain meds, cont oral abx, recs per nephro   Continued Need for Acute Rehabilitation Level of Care: The patient requires daily medical management by a physician with specialized training in  physical medicine and rehabilitation for the following reasons: Direction of a multidisciplinary physical rehabilitation program to maximize functional independence : Yes Medical management of patient stability for increased activity during participation in an intensive rehabilitation regime.: Yes Analysis of laboratory values and/or radiology reports with any subsequent need for medication adjustment and/or medical intervention. : Yes   I attest that I was present, lead the team conference, and concur with the assessment and plan of the team.   Margarito Liner 10/13/2020, 7:22 PM

## 2020-10-13 NOTE — Progress Notes (Signed)
Pt to receive iron IV today after Dialysis - Spoke to patients Nurse Dauda about pt needing access. Stated that Md was to cancel order since pt is difficult access stick for peripheral IV's Dialysis can give Iron after treatment. RN aware he needs to send med with patient to HD. Spoke with Dane RN in HD to verify - he said to make sure the IV iron  gets sent with patient to HD and they will give it.

## 2020-10-14 ENCOUNTER — Encounter (HOSPITAL_BASED_OUTPATIENT_CLINIC_OR_DEPARTMENT_OTHER): Payer: No Typology Code available for payment source | Admitting: Internal Medicine

## 2020-10-14 ENCOUNTER — Inpatient Hospital Stay (HOSPITAL_COMMUNITY): Payer: Medicare Other

## 2020-10-14 ENCOUNTER — Inpatient Hospital Stay (HOSPITAL_COMMUNITY): Payer: Medicare Other | Admitting: Occupational Therapy

## 2020-10-14 DIAGNOSIS — K5903 Drug induced constipation: Secondary | ICD-10-CM

## 2020-10-14 MED ORDER — CARVEDILOL 12.5 MG PO TABS: 12.5000 mg | ORAL_TABLET | Freq: Two times a day (BID) | ORAL | 0 refills | Status: AC

## 2020-10-14 MED ORDER — MELATONIN 3 MG PO TABS: 3.0000 mg | ORAL_TABLET | Freq: Every day | ORAL | 0 refills | Status: DC

## 2020-10-14 MED ORDER — OXYCODONE HCL ER 10 MG PO T12A: 10.0000 mg | EXTENDED_RELEASE_TABLET | Freq: Two times a day (BID) | ORAL | 0 refills | Status: DC

## 2020-10-14 MED ORDER — HYDRALAZINE HCL 50 MG PO TABS: 50.0000 mg | ORAL_TABLET | Freq: Three times a day (TID) | ORAL | 0 refills | Status: DC

## 2020-10-14 MED ORDER — DARBEPOETIN ALFA 200 MCG/0.4ML IJ SOSY: 200.0000 ug | PREFILLED_SYRINGE | INTRAMUSCULAR | Status: AC

## 2020-10-14 MED ORDER — OXYCODONE HCL 5 MG PO TABS
10.0000 mg | ORAL_TABLET | ORAL | Status: DC | PRN
  Administered 2020-10-14 – 2020-10-16 (×9): 10 mg via ORAL
  Filled 2020-10-14 (×9): qty 2

## 2020-10-14 MED ORDER — SEVELAMER CARBONATE 800 MG PO TABS: 3200.0000 mg | ORAL_TABLET | Freq: Three times a day (TID) | ORAL | 0 refills | Status: AC

## 2020-10-14 MED ORDER — ATORVASTATIN CALCIUM 80 MG PO TABS: 80.0000 mg | ORAL_TABLET | Freq: Every day | ORAL | 0 refills | Status: AC

## 2020-10-14 MED ORDER — CALCITRIOL 1 MCG/ML IV SOLN: 1.7500 ug | INTRAVENOUS | Status: AC

## 2020-10-14 MED ORDER — DULOXETINE HCL 30 MG PO CPEP: 30.0000 mg | ORAL_CAPSULE | Freq: Every day | ORAL | 3 refills | Status: AC

## 2020-10-14 MED ORDER — DOCUSATE SODIUM 100 MG PO CAPS: 100.0000 mg | ORAL_CAPSULE | Freq: Two times a day (BID) | ORAL | 0 refills | Status: DC

## 2020-10-14 MED ORDER — ACETAMINOPHEN 325 MG PO TABS: 650.0000 mg | ORAL_TABLET | Freq: Three times a day (TID) | ORAL | Status: AC

## 2020-10-14 MED ORDER — CALCIUM CARBONATE ANTACID 1250 MG/5ML PO SUSP: 500.0000 mg | Freq: Four times a day (QID) | ORAL | 0 refills | Status: AC | PRN

## 2020-10-14 MED ORDER — POLYETHYLENE GLYCOL 3350 17 G PO PACK
17.0000 g | PACK | Freq: Two times a day (BID) | ORAL | Status: DC
  Administered 2020-10-14 – 2020-10-15 (×3): 17 g via ORAL
  Filled 2020-10-14 (×5): qty 1

## 2020-10-14 MED ORDER — POLYETHYLENE GLYCOL 3350 17 G PO PACK: 17.0000 g | PACK | Freq: Two times a day (BID) | ORAL | 0 refills | Status: DC

## 2020-10-14 MED ORDER — RENA-VITE PO TABS: 1.0000 | ORAL_TABLET | ORAL | 0 refills | Status: AC

## 2020-10-14 MED ORDER — PREGABALIN 100 MG PO CAPS: 100.0000 mg | ORAL_CAPSULE | Freq: Every day | ORAL | 0 refills | Status: AC

## 2020-10-14 MED ORDER — AMLODIPINE BESYLATE 10 MG PO TABS: 10.0000 mg | ORAL_TABLET | Freq: Every day | ORAL | 1 refills | Status: AC

## 2020-10-14 MED ORDER — SULFAMETHOXAZOLE-TRIMETHOPRIM 800-160 MG PO TABS: 1.0000 | ORAL_TABLET | Freq: Every day | ORAL | 0 refills | Status: DC

## 2020-10-14 MED ORDER — DIALYVITE 800/ZINC 0.8 MG PO TABS: 1.0000 | ORAL_TABLET | ORAL | 0 refills | Status: AC

## 2020-10-14 NOTE — Progress Notes (Signed)
[place] PHYSICAL MEDICINE & REHABILITATION PROGRESS NOTE  Subjective/Complaints: Patient seen sitting up in bed this morning working with therapies.  Good sitting balance noted.  He states he did not sleep well overnight due to a "rough" dialysis session yesterday.  He notes constipation.  He was seen by prosthetist yesterday and states he will have a liner in a couple of days.  He states his ramp is being installed today.  He states he only received his long-acting pain medication once yesterday (received x2).  ROS: Denies CP, SOB, N/V/D  Objective: Vital Signs: Blood pressure (!) 186/83, pulse 73, temperature 98.2 F (36.8 C), temperature source Oral, resp. rate 18, height 6\' 4"  (1.93 m), weight 83 kg, SpO2 100 %. No results found. Recent Labs    10/11/20 1443 10/13/20 1327  WBC 11.6* 10.3  HGB 8.0* 8.4*  HCT 27.1* 28.2*  PLT 214 184   Recent Labs    10/11/20 1443 10/13/20 1326  NA 133* 132*  K 4.8 4.5  CL 92* 93*  CO2 22 24  GLUCOSE 79 113*  BUN 69* 50*  CREATININE 11.28* 9.15*  CALCIUM 8.8* 9.0    Intake/Output Summary (Last 24 hours) at 10/14/2020 0858 Last data filed at 10/13/2020 1736 Gross per 24 hour  Intake 600 ml  Output 1600 ml  Net -1000 ml        Physical Exam: BP (!) 186/83 (BP Location: Right Arm)   Pulse 73   Temp 98.2 F (36.8 C) (Oral)   Resp 18   Ht 6\' 4"  (1.93 m)   Wt 83 kg   SpO2 100%   BMI 22.27 kg/m  Constitutional: No distress . Vital signs reviewed. HENT: Normocephalic.  Atraumatic. Eyes: EOMI. No discharge. Cardiovascular: No JVD.  RRR. Respiratory: Normal effort.  No stridor.  Bilateral clear to auscultation. GI: Non-distended.  BS +. Skin: Warm and dry.   Right hand with dorsal area above 2-3 MCP joints with dressing CDI Left hand with dorsal area above second MCP joint with dressing CDI Right upper extremity with dry gangrene distal fifth digit > distal first digit, unchanged. Right knee with patella and right lateral  tibial DTI Left distal stump with callous and verrucous hyperplasia  Psych: Flat.  Normal behavior. MSK: Bilateral BKA B/l hands with edema and tenderness Neuro: Alert Motor: Makes eye contact with examiner.  Follows commands. Motor: Bilateral upper extremities: Proximally 5/5, distally limited due to pain, stable  Bilateral lower extremities: Hip flexion, knee extension 5/5, stable  Assessment/Plan: 1. Functional deficits secondary to PVD s/p amputation of digits in bilateral hands with history of bilateral BKA which require 3+ hours per day of interdisciplinary therapy in a comprehensive inpatient rehab setting.  Physiatrist is providing close team supervision and 24 hour management of active medical problems listed below.  Physiatrist and rehab team continue to assess barriers to discharge/monitor patient progress toward functional and medical goals   Care Tool:  Bathing  Bathing activity did not occur: Refused Body parts bathed by patient: Right arm, Left arm, Chest, Abdomen, Front perineal area, Right upper leg, Left upper leg, Face   Body parts bathed by helper: Buttocks Body parts n/a: Right lower leg, Left lower leg   Bathing assist Assist Level: Minimal Assistance - Patient > 75%     Upper Body Dressing/Undressing Upper body dressing   What is the patient wearing?: Pull over shirt    Upper body assist Assist Level: Set up assist    Lower Body Dressing/Undressing  Lower body dressing      What is the patient wearing?: Pants, Underwear/pull up     Lower body assist Assist for lower body dressing: Supervision/Verbal cueing     Toileting Toileting Toileting Activity did not occur (Clothing management and hygiene only): N/A (no void or bm)  Toileting assist Assist for toileting: Moderate Assistance - Patient 50 - 74%     Transfers Chair/bed transfer  Transfers assist     Chair/bed transfer assist level: Supervision/Verbal cueing      Locomotion Ambulation   Ambulation assist   Ambulation activity did not occur: Safety/medical concerns (per MD/PA no standing on prosthetics until wound care comes to assess pt)  Assist level: Minimal Assistance - Patient > 75% Assistive device: Walker-platform Max distance: 10'   Walk 10 feet activity   Assist  Walk 10 feet activity did not occur: Safety/medical concerns (per MD/PA no standing on prosthetics until wound care comes to assess pt)        Walk 50 feet activity   Assist Walk 50 feet with 2 turns activity did not occur: Safety/medical concerns (per MD/PA no standing on prosthetics until wound care comes to assess pt)         Walk 150 feet activity   Assist Walk 150 feet activity did not occur: Safety/medical concerns (per MD/PA no standing on prosthetics until wound care comes to assess pt)         Walk 10 feet on uneven surface  activity   Assist Walk 10 feet on uneven surfaces activity did not occur: Safety/medical concerns (per MD/PA no standing on prosthetics until wound care comes to assess pt)         Wheelchair     Assist Will patient use wheelchair at discharge?: Yes Type of Wheelchair: Power Wheelchair activity did not occur: Safety/medical concerns (not able to bear weight through hands and did not don R prosthetic due to wound)  Wheelchair assist level: Supervision/Verbal cueing Max wheelchair distance: >530ft    Wheelchair 50 feet with 2 turns activity    Assist    Wheelchair 50 feet with 2 turns activity did not occur: Safety/medical concerns (not able to bear weight through hands and did not don R prosthetic due to wound)   Assist Level: Supervision/Verbal cueing   Wheelchair 150 feet activity     Assist  Wheelchair 150 feet activity did not occur: Safety/medical concerns (not able to bear weight through hands and did not don R prosthetic due to wound)   Assist Level: Supervision/Verbal cueing    Medical  Problem List and Plan: 1.  Decreased functional mobility with failure to thrive secondary to severe PVD with history of bilateral BKA as well as status post amputation of multiple fingers critical limb ischemia.  Status post I&D of both hands 10/01/2020 per Dr. Burney Gauze. Orthopedic services advises to minimize weightbearing through hand as much as possible.  Continue CIR, plan for discharge on Saturday pending rehab placement. 2.  Antithrombotics: -DVT/anticoagulation: Subcutaneous heparin 5000U q8H              -antiplatelet therapy: N/A 3. Pain Management: Lyrica 100 mg nightly, oxycodone as needed             Monitor with increased exertion  Added Tylenol TID 11/5  Increased cymbalta to 30mg  11/7  Oxycontin 10 every 12 started on 11/10  Relatively controlled with meds on 11/11 4. Mood: Provide emotional support             -  antipsychotic agents: N/A 5. Neuropsych: This patient is capable of making decisions on his own behalf. 6. Skin/Wound Care: Routine skin checks with wound care nurse follow-up  Discussed with [employer] now, lower extremity prosthesis not to be worn until adjustments made due to worsening ulcers, continue to await new liner, which is delaying his ability to wear prosthesis and therefore transfer education/therapies 7. Fluids/Electrolytes/Nutrition: Routine and outs 8.  ID.  Wound culture left hand Proteus penneri.  Continue Bactrim for 2 weeks from 10/04/2020.  Follow-up outpatient infectious disease clinic 9.  ESRD.  Continue hemodialysis as directed             Recs per nephro 10.  Anemia of chronic disease.  Continue Aranesp.               Hemoglobin 8.4 on 11/10 11.  Hypertension.  Norvasc 10 mg daily, Coreg as directed, hydralazine 25 mg every 8 hours.               Elevated this a.m., monitor in accordance with dialysis to avoid hypotension/hypoperfusion 12.  Hyperlipidemia/severe PVD: Statin increased to high-dose after discussion with pharmacy on 11/9 13.  Sleep  disturbance: Started melatonin 3mg  HS on 11/6  Improving overall 14.  Leukocytosis  WBCs 10.3 on 11/10  Afebrile  Continue to monitor 15.  Drug-induced constipation  Bowel meds scheduled on 11/11  LOS: 8 days A FACE TO FACE EVALUATION WAS PERFORMED  Sufian Ravi Lorie Phenix 10/14/2020, 8:58 AM

## 2020-10-14 NOTE — Progress Notes (Signed)
[employer]ufacturing [employer] [facility]) Community Based Palliative Care  This patient is enrolled in our palliative care services in the community.   [facility] will continue to follow for any discharge planning needs and to coordinate continuation of palliative care.  If you have questions or need assistance, please call [phone] or contact the hospital Liaison listed on AMION.  Thank you for the opportunity to participate in this patient's care.  Gar Ponto, RN [facility] Liaison [phone]

## 2020-10-14 NOTE — Progress Notes (Signed)
Physical Therapy Session Note  Patient Details  Name: [name] MRN: [id number] Date of Birth: 1961-11-09  Today's Date: 10/14/2020 PT Individual Time: 1000-1026 and 1300-1309 PT Individual Time Calculation (min): 26 min  and 9 min Today's Date: 10/14/2020 PT Missed Time: 34 Minutes and 95 Minutes Missed Time Reason: Patient fatigue  Short Term Goals: Week 1:  PT Short Term Goal 1 (Week 1): pt will transfer stand<>pivot with LRAD CGA PT Short Term Goal 2 (Week 1): Pt will ambulate 73ft with LRAD CGA PT Short Term Goal 3 (Week 1): Pt will perform simulated car transfer with LRAD CGA  Skilled Therapeutic Interventions/Progress Updates:   Treatment Session 1: 1000-1026 26 min Received pt supine in bed asleep, upon wakening pt reported not feeling well, stating "I'm tired" and closing eyes again. Pt reported feeling exhausted from dialysis yesterday, not sleeping well last night, and not eating breakfast this morning due to lack of appetite. However, pt agreeable to therapist providing HEP. Therapist provided pt with HEP consisting of the following exercises but when therapist returned to review exercises, pt asleep. Left exercise printout on pt's table to review this afternoon. -Supine Straight Leg Hip Adduction and Quad Set with Ball - 1 x daily - 7 x weekly - 3 sets - 10 reps -Supine Active Straight Leg Raise - 1 x daily - 7 x weekly - 3 sets - 10 reps -Supine Hip Abduction - 1 x daily - 7 x weekly - 3 sets - 10 reps -Supine Abdominal Crunch - Hands Behind Head (BKA) - 1 x daily - 7 x weekly - 3 sets - 10 reps -Supine Shoulder Press - 1 x daily - 7 x weekly - 3 sets - 10 reps RN requested therapist assess pt's vitals. BP 155/73 and HR 70bpm at rest. Unable to obtain O2 sat due to poor perfusion reading. RN made aware. Concluded session with pt supine in bed, needs within reach, and bed alarm on. 34 minutes missed of skilled physical therapy due to fatigue.   Treatment Session 2:  5638-9373 9 min Received pt semi-reclined in bed awake, however pt reported feeling no better from this morning. Pt continued to report fatigue stating "I'm sorry I just can't get up right now." Therapist suggested bed level exercises, however pt politely refused. Pt reported having minor pain in bilateral hands (RN made aware and planning to administer medications before 2pm). Therapist assisted with maneuvering power WC around room and plugging it in and educated pt on importance of ensuring WC is fully charged for mobility; pt verbalized understanding. Concluded session with pt semi-reclined in bed, needs within reach, and bed alarm on. 51 minutes missed of skilled physical therapy due to fatigue.   Therapy Documentation Precautions:  Precautions Precautions: Fall Precaution Comments: bilateral prosthetics Restrictions Weight Bearing Restrictions: No Other Position/Activity Restrictions: MD and PA stated no weight bearing through hands but no formal order written  Therapy/Group: Individual Therapy Alfonse Alpers PT, DPT   10/14/2020, 7:33 AM

## 2020-10-14 NOTE — Progress Notes (Signed)
Occupational Therapy Session Note  Patient Details  Name: CAMARI WISHAM MRN: [id number] Date of Birth: 1961-08-28  Today's Date: 10/14/2020 OT Individual Time: 0800-0901 OT Individual Time Calculation (min): 61 min    Short Term Goals: Week 1:  OT Short Term Goal 1 (Week 1): STGs equal to LTGs set at supervison level overall.  Skilled Therapeutic Interventions/Progress Updates:    Pt in bed to start session requesting to try and transfer to the [facility], anterior/posterior.  Therapist helped hold the commode while he completed transfer with supervision.  He was able to complete clothing management with lateral leans.  Max assist was needed for toilet hygiene as there were no extra large gloves to place on the left hand for him to complete this.  Once clean, he transferred anteriorly onto the bed and then worked on bathing and dressing from sitting to supine.  He was able to complete UB bathing with setup of washcloth and supervision.  He also donned the pullover shirt with supervision.  He washed his front peri area as well as his upper legs with setup and donned his shorts and pants in supine with rolling and bridging.  He then completed transfer to the power wheelchair posteriorly with supervision and completed oral hygiene at the sink with modified independence.  He was able to use his power wheelchair down to the nurses station for pain meds and back with modified independence.  Pt transferred back to the bed with supervision anteriorly to finish session with the call button and phone in reach and safety alarm in place.    Therapy Documentation Precautions:  Precautions Precautions: Fall Precaution Comments: bilateral prosthetics Restrictions Weight Bearing Restrictions: No Other Position/Activity Restrictions: MD and PA stated no weight bearing through hands but no formal order written   Pain: Pain Assessment Pain Scale: 0-10 Pain Score: 9  Faces Pain Scale: Hurts a little bit Pain  Type: Acute pain Pain Location: Hand Pain Orientation: Right;Left Pain Descriptors / Indicators: Aching Pain Onset: On-going Pain Intervention(s): Medication (See eMAR) ADL:  See Care Tool Section for some details of mobility and selfcare   Therapy/Group: Individual Therapy  Brittania Sudbeck OTR/L 10/14/2020, 12:14 PM

## 2020-10-14 NOTE — Progress Notes (Signed)
[facility] Progress Note   Subjective:   Had dialysis yesterday net UF 1.4L No cramping but felt 'rough' after No complaints this am. Working with OT   Objective  Vitals:   10/13/20 1700 10/13/20 1815 10/13/20 1936 10/14/20 0509  BP: (!) 145/79 (!) 173/88 (!) 192/82 (!) 186/83  Pulse: 63 72 80 73  Resp:  16  18  Temp:  98.3 F (36.8 C) 98.3 F (36.8 C) 98.2 F (36.8 C)  TempSrc:  Oral Oral Oral  SpO2:  99% 97% 100%  Weight:  83 kg    Height:       Physical Exam General: WNWD , nad  Heart: S1,S2 RRR No M/R/G Lungs: CTAB Abdomen: S, NT active BS Extremities: Bilateral BKA no stump edema. Ace wrap both hands.  Dialysis Access: L AVF + bruit   Additional Objective Labs: Basic Metabolic Panel: Recent Labs  Lab 10/08/20 1433 10/11/20 1443 10/13/20 1326  NA 133* 133* 132*  K 4.2 4.8 4.5  CL 93* 92* 93*  CO2 24 22 24   GLUCOSE 102* 79 113*  BUN 60* 69* 50*  CREATININE 9.85* 11.28* 9.15*  CALCIUM 8.9 8.8* 9.0  PHOS 7.3* 7.3* 6.1*   Liver Function Tests: Recent Labs  Lab 10/08/20 1433 10/11/20 1443 10/13/20 1326  ALBUMIN 2.7* 2.8* 2.8*   No results for input(s): LIPASE, AMYLASE in the last 168 hours. CBC: Recent Labs  Lab 10/08/20 1433 10/11/20 1443 10/13/20 1327  WBC 9.9 11.6* 10.3  HGB 7.8* 8.0* 8.4*  HCT 25.9* 27.1* 28.2*  MCV 75.1* 76.3* 76.6*  PLT 224 214 184   Blood Culture    Component Value Date/Time   SDES WOUND 10/01/2020 1140   SPECREQUEST LEFT HAND SPEC A 10/01/2020 1140   CULT  10/01/2020 1140    FEW PROTEUS PENNERI FEW MORGANELLA MORGANII NO ANAEROBES ISOLATED Performed at [facility], [place] [address] [place], [place] [address]    REPTSTATUS 10/06/2020 FINAL 10/01/2020 1140    Cardiac Enzymes: No results for input(s): CKTOTAL, CKMB, CKMBINDEX, TROPONINI in the last 168 hours. CBG: No results for input(s): GLUCAP in the last 168 hours. Iron Studies:  No results for input(s): IRON, TIBC, TRANSFERRIN,  FERRITIN in the last 72 hours. @lablastinr3 @ Studies/Results: No results found. Medications: . iron sucrose Stopped (10/13/20 1833)   . acetaminophen  650 mg Oral TID   Or  . acetaminophen  650 mg Rectal TID  . amLODipine  10 mg Oral Daily  . atorvastatin  80 mg Oral QHS  . calcitRIOL  1.8 mcg Intravenous Q M,W,F-HD  . carvedilol  12.5 mg Oral Q breakfast  . carvedilol  12.5 mg Oral Once per day on Sun Tue Thu Sat  . Chlorhexidine Gluconate Cloth  6 each Topical Q0600  . darbepoetin (ARANESP) injection - DIALYSIS  200 mcg Intravenous Q Mon-HD  . docusate sodium  100 mg Oral BID  . DULoxetine  30 mg Oral Daily  . heparin  5,000 Units Subcutaneous Q8H  . hydrALAZINE  50 mg Oral Q8H  . melatonin  3 mg Oral QHS  . multivitamin  1 tablet Oral Q M,W,F-HD  . oxyCODONE  10 mg Oral Q12H  . polyethylene glycol  17 g Oral BID  . pregabalin  100 mg Oral QHS  . sevelamer carbonate  3,200 mg Oral TID WC  . sulfamethoxazole-trimethoprim  1 tablet Oral Daily  . white petrolatum   Topical BID   Dialysis Orders: GO MWF 4h 33min 200NRe  425/800 EDW 84kg 2K/2Ca L AVF No heparin  Venofer 100mg  IV x 10 (until 11/15) Mircera 150 q 2 weeks (last 10/15)  Calcitriol 1.75 TIW  Assessment/Plan: 1. PVD/L hand ischemia/infection. S/p recent amp L index finger.S/pOR washout on 10/29.Now in CIR. Planning for discharge Friday per patient  3. ESRD -HD MWF.Next HD 11/12 4. Hypertension/volume - Continues to be hypertensive despite lowering wt. Hydralazine increased to 50mg  TID. Will have Lower EDW on DC.  Volume ok. Is probably at EDW now -only tolerated 1.4L UF on 11/10 5. Anemia - Hgb8.4, trend improving. Aranesp increased 200 q Monday. Tsat 26% Load with load with iron X 5 doses with HD 6. Metabolic bone disease -Calcium controlled.PO4 elevated, but down trending after increase in binders.  7. DMT2 -per primary  [name] Child PA-C [facility] 10/14/2020,9:22  AM

## 2020-10-14 NOTE — Discharge Summary (Signed)
Physician Discharge Summary  Patient ID: [name] MRN: [id number] DOB/AGE: 1961-08-12 59 y.o.  Admit date: 10/06/2020 Discharge date: 10/16/2020  Discharge Diagnoses:  Principal Problem:   Finger amputee Active Problems:   S/P bilateral below knee amputation ([place])   Labile blood pressure   Dyslipidemia   Sleep disturbance   Hypertension   Drug induced constipation DVT prophylaxis End-stage renal disease with hemodialysis Chronic anemia Pain management  Discharged Condition: Stable  Significant Diagnostic Studies: No results found.  Labs:  Basic Metabolic Panel: Recent Labs  Lab 10/08/20 1433 10/11/20 1443 10/13/20 1326  NA 133* 133* 132*  K 4.2 4.8 4.5  CL 93* 92* 93*  CO2 24 22 24   GLUCOSE 102* 79 113*  BUN 60* 69* 50*  CREATININE 9.85* 11.28* 9.15*  CALCIUM 8.9 8.8* 9.0  PHOS 7.3* 7.3* 6.1*    CBC: Recent Labs  Lab 10/08/20 1433 10/11/20 1443 10/13/20 1327  WBC 9.9 11.6* 10.3  HGB 7.8* 8.0* 8.4*  HCT 25.9* 27.1* 28.2*  MCV 75.1* 76.3* 76.6*  PLT 224 214 184    CBG: No results for input(s): GLUCAP in the last 168 hours.  Family history. Mother with hypertension and diabetes father with hypertension. Denies any colon cancer esophageal cancer rectal cancer  Brief HPI:   [name] is a 59 y.o. right-handed male with history of end-stage renal disease with hemodialysis, diabetes mellitus hypertension he quit smoking 8 years ago peripheral vascular disease with right interphalanx amputation 03/28/2020 and right index finger amputation 03/12/2020 right middle and fourth finger amputations 04/24/2020 as well as right BKA 10/26/2018 receiving inpatient rehab services and left BKA 05/30/2017 again receiving inpatient rehab services 06/04/2017 to 06/12/2017. Patient lives with spouse 1 level home independent with assistive device bilateral prosthesis and a walker. Takes transportation bus to hemodialysis. Presented 09/22/2020 after recent amputation of left  index finger for recurrent infection at the MP joint by Dr. Fredna Dow 09/06/2020. Maintained on antibiotic therapy. He did see Dr. Megan Salon in the infectious disease clinic 09/09/2020 felt the infection had cleared antibiotics were discontinued. He was referred to outpatient palliative care. Patient with progressive decrease in mobility developing lower extremity ulcers on bilateral stumps and right knee. Follow-up orthopedic services patient underwent irrigation debridement bilateral hand wounds by Dr. Burney Gauze 10/01/2020. At time of latest admission BUN 65 creatinine 10.60 glucose 105 WBC 10,600 hemoglobin 8.1. Wound culture from left hand grew Proteus penneri and maintained on Bactrim at the recommendations of Dr. Michel Bickers through 10/17/2020. Renal service follow-up hemodialysis ongoing. Subcutaneous heparin for DVT prophylaxis. Wound care nurse follow-up for wounds bilateral BKA right knee as well as hand with dressing changes as directed. Therapy evaluations completed. Patient was admitted for a comprehensive rehab program.   Hospital Course: [name] was admitted to rehab 10/06/2020 for inpatient therapies to consist of PT, ST and OT at least three hours five days a week. Past admission physiatrist, therapy team and rehab RN have worked together to provide customized collaborative inpatient rehab. Pertaining to patient's decreased functional ability failure to thrive secondary to PVD with history of bilateral BKA as well as status post amputation of multiple fingers critical limb ischemia. Status post I&D of both hands 10/01/2020 followed by Dr. Burney Gauze. He did receive modifications of his prosthesis by [employer]. Subcutaneous heparin for DVT prophylaxis no bleeding episodes. He was maintained on Lyrica for pain management as well as scheduled OxyContin. His Cymbalta was increased to 30 mg and he was using oxycodone for  breakthrough pain. ID follow-up after recent multiple finger amputations  he was completed course of Bactrim he remained afebrile. Hemodialysis ongoing as per renal services. Acute on chronic anemia hemoglobin 8.0 he remained on Aranesp. Blood pressure control with Norvasc Coreg as well as hydralazine would follow-up with primary MD. History of hyperlipidemia he remained on statin. Some sleep disturbance improved with the use of melatonin.   Blood pressures were monitored on TID basis and controlled    Rehab course: During patient's stay in rehab weekly team conferences were held to monitor patient's progress, set goals and discuss barriers to discharge. At admission, patient required minimal assist stand pivot transfers minimal assist 35 feet bilateral platform walker minimal assist supine to sit. Max assist upper body bathing max assist lower body bathing mod assist upper body dressing moderate assist lower body dressing  Physical exam. Blood pressure 154/70 pulse 72 temperature 98.9 respirations 18 oxygen saturation 9 9% room air Constitutional. No acute distress HEENT Head. Normocephalic and atraumatic Eyes. Pupils round and reactive to light no discharge.nystagmus Neck. Supple nontender no JVD without thyromegaly Cardiac regular rate rhythm without extra sounds or murmur heard Abdomen. Soft nontender positive bowel sounds without rebound Respiratory effort normal no respiratory distress without wheeze Musculoskeletal. Normal range of motion Comments. Bilateral hands with edema and tenderness Right upper extremity amputation of digits 2-4 Left upper extremity amputation of second digit Skin. Comments. Right hand dressing clean dry and intact. Amputation sites crusted areas noted without odor on bilateral hands Right knee with patellar and right lateral tibial ulcer Left distal stump with lesion Neurologic. Alert oriented no acute distress Motor. Bilateral upper extremities proximally 5/5 distally limited due to pain Bilateral lower extremities hip flexion  knee extension 5/5  He/  has had improvement in activity tolerance, balance, postural control as well as ability to compensate for deficits. He/ has had improvement in functional use RUE/LUE  and RLE/[name] as well as improvement in awareness. Patient was able to transfer to wheelchair via posterior scoot using elbows to prop up on wheelchair armrest. Patient able to safely maneuver wheelchair to go outside to help with mood elevation. Patient able to demonstrate safely toggling speed and adjusting how reclined wheelchair is able to drive outside with power controls. ADLs treatment sessions focused on self-care retraining in functional transfers and mobility. Engaged in bathing and dressing with focus on increase independence with lower body bathing and dressing. Completed lower body bathing with lateral leans at edge of bed required return to semirecline to completely advance pants over hips. Patient able to complete bathing and dressing with set up provided with supervision for lower body dressing. Patient completed posterior transfer bed to power wheelchair with set up and supervision. Patient gathered clothing from wheelchair level and navigated to laundry room with power wheelchair. Full family teaching completed and plan discharged home.       Disposition: Discharged to home    Diet: Renal diet  Special Instructions: No driving smoking or alcohol  Continue hemodialysis as directed  Wound care. Apply normal saline moistened dressing cover with dry gauze to right hand.  Apply iodine to the dried crusted areas bilateral lower extremity daily allow to air dry.  Medications at discharge 1. Tylenol as needed 2. Norvasc 10 mg p.o. daily 3. Lipitor 80 mg p.o. nightly 4. Coreg 12.5 mg p.o. Sunday Tuesday Thursday Saturday 5. Colace 100 mg p.o. twice daily 7. Cymbalta 30 mg p.o. daily 8. Hydralazine 75 mg every 8 hours 9. Melatonin 3  mg nightly 10. Rena-Vite 1 tablet Monday Wednesday  Friday 11. Oxycodone 10 mg every 4 hours as needed pain 12. OxyContin sustained-release 10 mg every 12 hours x2 weeks and stop 13. Lyrica 100 mg p.o. nightly 14. Bactrim 1 tab p.o. daily through 10/17/2020 and stop  30-35 minutes were spent completing discharge summary and discharge planning   Discharge Instructions    Ambulatory referral to Physical Medicine Rehab   Complete by: As directed    Moderate complexity follow-up 1 to 2 weeks bilateral BKA as well as left hand I&D       Follow-up Information    [name] Arn, MD Follow up.   Specialty: Physical Medicine and Rehabilitation Why: Office to call for appointment Contact information: [address] [place] [address] [phone]        Charlotte Crumb, MD Follow up.   Specialty: Orthopedic Surgery Why: Call for appointment Contact information: [address] [place] [address] [phone]        Michel Bickers, MD Follow up.   Specialty: Infectious Diseases Why: Call for appointment Contact information: [address] [address] [phone]        Dwana Melena, MD Follow up.   Specialty: Nephrology Why: Call for appointment Contact information: [place] [address] [phone]               Signed: Lavon Paganini [place] 10/15/2020, 5:24 AM

## 2020-10-14 NOTE — Progress Notes (Signed)
Per PT patient claim he's not feeling well; Patient claims he is tired ; Patient had HD last night; V/s WNL. Pain med given when patient was with OT. We'll continue to monitor.

## 2020-10-15 ENCOUNTER — Inpatient Hospital Stay (HOSPITAL_COMMUNITY): Payer: Medicare Other | Admitting: Occupational Therapy

## 2020-10-15 ENCOUNTER — Inpatient Hospital Stay (HOSPITAL_COMMUNITY): Payer: Medicare Other

## 2020-10-15 LAB — RENAL FUNCTION PANEL
Albumin: 3.1 g/dL — ABNORMAL LOW (ref 3.5–5.0)
Anion gap: 15 (ref 5–15)
BUN: 35 mg/dL — ABNORMAL HIGH (ref 8–23)
CO2: 24 mmol/L (ref 22–32)
Calcium: 9.7 mg/dL (ref 8.9–10.3)
Chloride: 97 mmol/L — ABNORMAL LOW (ref 98–111)
Creatinine, Ser: 8.58 mg/dL — ABNORMAL HIGH (ref 0.61–1.24)
GFR, Estimated: 6 mL/min — ABNORMAL LOW (ref >60)
Glucose, Bld: 86 mg/dL (ref 70–99)
Phosphorus: 6 mg/dL — ABNORMAL HIGH (ref 2.5–4.6)
Potassium: 4.5 mmol/L (ref 3.5–5.1)
Sodium: 136 mmol/L (ref 135–145)

## 2020-10-15 LAB — CBC
HCT: 32.4 % — ABNORMAL LOW (ref 39.0–52.0)
Hemoglobin: 9.7 g/dL — ABNORMAL LOW (ref 13.0–17.0)
MCH: 23.3 pg — ABNORMAL LOW (ref 26.0–34.0)
MCHC: 29.9 g/dL — ABNORMAL LOW (ref 30.0–36.0)
MCV: 77.7 fL — ABNORMAL LOW (ref 80.0–100.0)
Platelets: 162 10*3/uL (ref 150–400)
RBC: 4.17 MIL/uL — ABNORMAL LOW (ref 4.22–5.81)
RDW: 20.2 % — ABNORMAL HIGH (ref 11.5–15.5)
WBC: 9 10*3/uL (ref 4.0–10.5)
nRBC: 0.2 % (ref 0.0–0.2)

## 2020-10-15 MED ORDER — HYDRALAZINE HCL 50 MG PO TABS
75.0000 mg | ORAL_TABLET | Freq: Three times a day (TID) | ORAL | Status: DC
  Administered 2020-10-15 – 2020-10-16 (×2): 75 mg via ORAL
  Filled 2020-10-15 (×2): qty 1

## 2020-10-15 MED ORDER — BISACODYL 10 MG RE SUPP
10.0000 mg | Freq: Every day | RECTAL | Status: DC | PRN
  Administered 2020-10-15: 10 mg via RECTAL
  Filled 2020-10-15: qty 1

## 2020-10-15 MED ORDER — HYDRALAZINE HCL 25 MG PO TABS: 75.0000 mg | ORAL_TABLET | Freq: Three times a day (TID) | ORAL | 5 refills | Status: AC

## 2020-10-15 MED ORDER — OXYCODONE HCL 10 MG PO TABS: 10.0000 mg | ORAL_TABLET | ORAL | 0 refills | Status: DC | PRN

## 2020-10-15 MED ORDER — SENNOSIDES-DOCUSATE SODIUM 8.6-50 MG PO TABS
2.0000 | ORAL_TABLET | Freq: Two times a day (BID) | ORAL | Status: DC
  Administered 2020-10-15: 2 via ORAL
  Filled 2020-10-15 (×3): qty 2

## 2020-10-15 MED ORDER — HYDRALAZINE HCL 25 MG PO TABS
75.0000 mg | ORAL_TABLET | Freq: Three times a day (TID) | ORAL | 5 refills | Status: DC
Start: 2020-10-15 — End: 2020-10-15

## 2020-10-15 MED ORDER — SENNOSIDES-DOCUSATE SODIUM 8.6-50 MG PO TABS
2.0000 | ORAL_TABLET | Freq: Two times a day (BID) | ORAL | Status: DC
Start: 2020-10-15 — End: 2020-10-15

## 2020-10-15 MED ORDER — CALCITRIOL 1 MCG/ML IV SOLN
INTRAVENOUS | Status: AC
  Filled 2020-10-15: qty 2

## 2020-10-15 NOTE — Progress Notes (Signed)
Inpatient Rehabilitation Care Coordinator  Discharge Note  The overall goal for the admission was met for: DC SAT 11/13  Discharge location: Yes-HOME WITH WIFE WHO IS IN AND OUT. DID NOT SEE HER WHILE PT WAS HERE  Length of Stay: Yes-10 DAYS  Discharge activity level: Yes-MOD/I WHEELCHAIR LEVEL  Home/community participation: Yes  Services provided included: MD, RD, PT, OT, RN, CM, Pharmacy, Neuropsych and SW  Financial Services: Medicare and Medicaid  Follow-up services arranged: Home Health: [place], DME: [facility] and Patient/Family request agency HH: HAS HAD [place] BEFORE-PREF, DME: NO PREF  Comments (or additional information):PT DID WELL AND WAS MOD/I WITH POWER CHAIR, HE HAD A RAMP PLACED OVER THE THRESHOLD OF HIS HOME TO ACCESS IT. [place] HD M, W, F. WIFE IS IN AND OUT  Patient/Family verbalized understanding of follow-up arrangements: Yes  Individual responsible for coordination of the follow-up plan: [name] [phone] OR PATIENT [phone]  Confirmed correct DME delivered: Elease Hashimoto 10/15/2020    Elease Hashimoto

## 2020-10-15 NOTE — Discharge Instructions (Signed)
Inpatient Rehab Discharge Instructions  [place] Discharge date and time: No discharge date for patient encounter.   Activities/Precautions/ Functional Status: Activity: activity as tolerated Diet: renal diet Wound Care: Routine skin checks Functional status:  ___ No restrictions     ___ Walk up steps independently ___ 24/7 supervision/assistance   ___ Walk up steps with assistance ___ Intermittent supervision/assistance  ___ Bathe/dress independently ___ Walk with walker     _x__ Bathe/dress with assistance ___ Walk Independently    ___ Shower independently ___ Walk with assistance    ___ Shower with assistance ___ No alcohol     ___ Return to work/school ________  Special Instructions: No driving smoking or alcohol  Continue hemodialysis as directed  Apply normal saline moistened dressing cover with dry gauze to right hand daily  Apply iodine to the dried crusted areas bilateral lower extremity daily allowed to air dry  Follow-up with Dr. Burney Gauze 3 to 5 days for removal of sutures from hand 375- [place]:    Home Health:   [place] Equipment/Items Ordered:POWER CHAIR                                                 Agency/Supplier:[facility]  [place] RESOURCES FOR PATIENT/FAMILY: Support Groups:[facility] SUPPORT GROUP EVERY SECOND Thursday @ 7:00-8:30 PM AT [facility] CONTACT: [name] [phone]  My questions have been answered and I understand these instructions. I will adhere to these goals and the provided educational materials after my discharge from the hospital.  Patient/Caregiver Signature _______________________________ Date __________  Clinician Signature _______________________________________ Date __________  Please  bring this form and your medication list with you to all your follow-up doctor's appointments.

## 2020-10-15 NOTE — Progress Notes (Signed)
[facility] PHYSICAL MEDICINE & REHABILITATION PROGRESS NOTE  Subjective/Complaints: Patient seen sitting up in bed this AM. He states he slept well overnight.  He has questions regarding pain medication and states he is not getting all of his meds.  Discussed prosthesis with plans for new liners and sockets with prosthetist.  He states his ramp is in place.  ROS: Denies CP, SOB, N/V/D  Objective: Vital Signs: Blood pressure (!) 167/78, pulse 67, temperature 98.7 F (37.1 C), temperature source Oral, resp. rate 18, height 6\' 4"  (1.93 m), weight 83 kg, SpO2 96 %. No results found. Recent Labs    10/13/20 1327  WBC 10.3  HGB 8.4*  HCT 28.2*  PLT 184   Recent Labs    10/13/20 1326  NA 132*  K 4.5  CL 93*  CO2 24  GLUCOSE 113*  BUN 50*  CREATININE 9.15*  CALCIUM 9.0   No intake or output data in the 24 hours ending 10/15/20 1011      Physical Exam: BP (!) 167/78 (BP Location: Right Arm)    Pulse 67    Temp 98.7 F (37.1 C) (Oral)    Resp 18    Ht 6\' 4"  (1.93 m)    Wt 83 kg    SpO2 96%    BMI 22.27 kg/m  Constitutional: No distress . Vital signs reviewed. HENT: Normocephalic.  Atraumatic. Eyes: EOMI. No discharge. Cardiovascular: No JVD.  RRR. Respiratory: Normal effort.  No stridor.  Bilateral clear to auscultation. GI: Non-distended.  BS +. Skin: Warm and dry.  Intact. Right hand with dorsal area above 2-3 MCP joints with good granulation tissue Left hand with dorsal area above second MCP joint with slough and escar Right upper extremity with dry gangrene distal fifth digit > distal first digit, stable Right knee with patella and right lateral tibial DTI Left distal stump with callous and verrucous hyperplasia  Psych: Flat. Normal behavior. Musc: Bilateral BKA B/l hands with edema and tenderness Neuro: Alert Motor: Makes eye contact with examiner.  Follows commands. Motor: Bilateral upper extremities: Proximally 5/5, distally limited due to pain,  unchanged Bilateral lower extremities: Hip flexion, knee extension 5/5, stable  Assessment/Plan: 1. Functional deficits secondary to PVD s/p amputation of digits in bilateral hands with history of bilateral BKA which require 3+ hours per day of interdisciplinary therapy in a comprehensive inpatient rehab setting.  Physiatrist is providing close team supervision and 24 hour management of active medical problems listed below.  Physiatrist and rehab team continue to assess barriers to discharge/monitor patient progress toward functional and medical goals   Care Tool:  Bathing  Bathing activity did not occur: Refused Body parts bathed by patient: Right arm, Left arm, Chest, Abdomen, Front perineal area, Right upper leg, Left upper leg, Face, Buttocks   Body parts bathed by helper: Buttocks Body parts n/a: Right lower leg, Left lower leg   Bathing assist Assist Level: Supervision/Verbal cueing     Upper Body Dressing/Undressing Upper body dressing   What is the patient wearing?: Pull over shirt    Upper body assist Assist Level: Independent    Lower Body Dressing/Undressing Lower body dressing      What is the patient wearing?: Pants, Underwear/pull up     Lower body assist Assist for lower body dressing: Supervision/Verbal cueing     Toileting Toileting Toileting Activity did not occur (Clothing management and hygiene only): N/A (no void or bm)  Toileting assist Assist for toileting: Set up assist  Transfers Chair/bed transfer  Transfers assist     Chair/bed transfer assist level: Supervision/Verbal cueing (AP/PA transfer)     Locomotion Ambulation   Ambulation assist   Ambulation activity did not occur: Safety/medical concerns (Per PA and MD no standing on prosthetics due to wounds and to avoid weight bearing through BUE due to digit amputations)  Assist level: Minimal Assistance - Patient > 75% Assistive device: Walker-platform Max distance: 10'    Walk 10 feet activity   Assist  Walk 10 feet activity did not occur: Safety/medical concerns (Per PA and MD no standing on prosthetics due to wounds and to avoid weight bearing through BUE due to digit amputations)        Walk 50 feet activity   Assist Walk 50 feet with 2 turns activity did not occur: Safety/medical concerns (Per PA and MD no standing on prosthetics due to wounds and to avoid weight bearing through BUE due to digit amputations)         Walk 150 feet activity   Assist Walk 150 feet activity did not occur: Safety/medical concerns (Per PA and MD no standing on prosthetics due to wounds and to avoid weight bearing through BUE due to digit amputations)         Walk 10 feet on uneven surface  activity   Assist Walk 10 feet on uneven surfaces activity did not occur: Safety/medical concerns (Per PA and MD no standing on prosthetics due to wounds and to avoid weight bearing through BUE due to digit amputations)         Wheelchair     Assist Will patient use wheelchair at discharge?: Yes Type of Wheelchair: Manual Wheelchair activity did not occur: Safety/medical concerns (not able to bear weight through hands and did not don R prosthetic due to wound)  Wheelchair assist level: Independent Max wheelchair distance: >539ft    Wheelchair 50 feet with 2 turns activity    Assist    Wheelchair 50 feet with 2 turns activity did not occur: Safety/medical concerns (not able to bear weight through hands and did not don R prosthetic due to wound)   Assist Level: Independent   Wheelchair 150 feet activity     Assist  Wheelchair 150 feet activity did not occur: Safety/medical concerns (not able to bear weight through hands and did not don R prosthetic due to wound)   Assist Level: Independent    Medical Problem List and Plan: 1.  Decreased functional mobility with failure to thrive secondary to severe PVD with history of bilateral BKA as well as  status post amputation of multiple fingers critical limb ischemia.  Status post I&D of both hands 10/01/2020 per Dr. Burney Gauze. Orthopedic services advises to minimize weightbearing through hand as much as possible.  Continue CIR, plan for discharge tomorrow, ramp in place  Will see patient for transitional care management in 1-2 weeks post-discharge 2.  Antithrombotics: -DVT/anticoagulation: Subcutaneous heparin 5000U q8H              -antiplatelet therapy: N/A 3. Pain Management: Lyrica 100 mg nightly, oxycodone as needed             Monitor with increased exertion  Added Tylenol TID 11/5  Increased cymbalta to 30mg  11/7  Oxycontin 10 every 12 started on 11/10  Relatively controlled with meds on 11/12 4. Mood: Provide emotional support             -antipsychotic agents: N/A 5. Neuropsych: This patient is capable of making  decisions on his own behalf. 6. Skin/Wound Care: Routine skin checks with wound care nurse follow-up  Discussed with [employer] again, lower extremity prosthesis not to be worn at present - plan for new sockets and liners as it has been greater than 5 years prosthesis and therefore transfer education/therapies 7. Fluids/Electrolytes/Nutrition: Routine and outs 8.  ID.  Wound culture left hand Proteus penneri.  Continue Bactrim for 2 weeks from 10/04/2020.  Follow-up outpatient infectious disease clinic 9.  ESRD.  Continue hemodialysis as directed             Recs per nephro 10.  Anemia of chronic disease.  Continue Aranesp.               Hemoglobin 8.4 on 11/10 11.  Hypertension.  Norvasc 10 mg daily, Coreg as directed, hydralazine 25 mg every 8 hours.               Elevated this AM, monitor in accordance with dialysis to avoid hypotension/hypoperfusion 12.  Hyperlipidemia/severe PVD: Statin increased to high-dose after discussion with pharmacy on 11/9 13.  Sleep disturbance: Started melatonin 3mg  HS on 11/6  Improving overall 14.  Leukocytosis  WBCs 10.3 on  11/10  Afebrile  Continue to monitor 15.  Drug-induced constipation  Bowel meds scheduled on 11/11, increased again on 11/12  Needs to have BM prior to d/c  LOS: 9 days A FACE TO FACE EVALUATION WAS PERFORMED  Jesilyn Easom Lorie Phenix 10/15/2020, 10:11 AM

## 2020-10-15 NOTE — Progress Notes (Signed)
Occupational Therapy Session Note  Patient Details  Name: [name] MRN: [id number] Date of Birth: December 18, 1960  Today's Date: 10/15/2020 OT Individual Time: 4680-3212 OT Individual Time Calculation (min): 45 min  and Today's Date: 10/15/2020 OT Missed Time: 15 Minutes Missed Time Reason: Patient fatigue (toileting)   Short Term Goals: Week 1:  OT Short Term Goal 1 (Week 1): STGs equal to LTGs set at supervison level overall.  Skilled Therapeutic Interventions/Progress Updates:    Treatment session with focus on self-care retraining and functional transfers.  Pt received upright on [facility] with RN present.  RN reports just setup for [facility] placement and pt completing clothing management and transfer supervision.  Pt reports feeling constipated, requested "something" to assist with constipation and RN provided PRN bowel meds.  Pt required increased time on toilet with no results. Engaged in [place] bathing and dressing from [facility] with setup for bathing items.  Pt asking questions about d/c transportation - notified PT to address with pt.  Pt requested to be left on BSC, therefore pt missed 15 mins.  Per RN, pt completed A/P transfer back to bed post toileting and completed clothing management with supervision.    Therapy Documentation Precautions:  Precautions Precautions: Fall Precaution Comments: bilateral BKA with prosthetics Restrictions Weight Bearing Restrictions: No Other Position/Activity Restrictions: Per MD and PA no weight bearing through hands but no formal order written. Per MD, no wearing prosthetics due to wounds Pain: Pain Assessment Pain Scale: 0-10 Pain Score: 10-Worst pain ever Pain Type: Acute pain Pain Location: Hand Pain Orientation: Right;Left Pain Descriptors / Indicators: Aching Pain Intervention(s): Repositioned;Other (Comment) (medicated just prior to session) ADL: ADL Eating: Modified independent Where Assessed-Eating: Edge of bed Grooming: Modified  independent Where Assessed-Grooming: Sitting at sink Upper Body Bathing: Supervision/safety Where Assessed-Upper Body Bathing: Edge of bed Lower Body Bathing: Supervision/safety Where Assessed-Lower Body Bathing: Edge of bed Upper Body Dressing: Independent Where Assessed-Upper Body Dressing: Edge of bed Lower Body Dressing: Supervision/safety Where Assessed-Lower Body Dressing: Edge of bed Toileting: Supervision/safety Where Assessed-Toileting: Bedside Commode Toilet Transfer: Close supervision Toilet Transfer Method: Other (comment) (A/P transfer bed <> BSC) Toilet Transfer Equipment: Engineer, technical sales Transfer: Not assessed Social research officer, government: Not assessed   Therapy/Group: Individual Therapy  Simonne Come 10/15/2020, 8:27 AM

## 2020-10-15 NOTE — Progress Notes (Signed)
Occupational Therapy Discharge Summary  Patient Details  Name: [name] MRN: [id number] Date of Birth: [birth year]   Patient has met 8 of 8 long term goals due to improved balance, postural control and ability to compensate for deficits.  Patient to discharge at overall Supervision level.  Patient's care partner is independent to provide the necessary intermittent assistance at discharge.  Patient to complete A/P transfers until cleared to resume use of BLE prosthetics, has demonstrated ability to complete with supervision.  Pt will continue bathing at bed level or EOB until cleared to ambulate in to bathroom as pt bathroom not w/c accessible.    Reasons goals not met: N/A  Recommendation:  Patient will benefit from ongoing skilled OT services in home health setting to continue to advance functional skills in the area of BADL and Reduce care partner burden.  Equipment: No equipment provided  Reasons for discharge: treatment goals met and discharge from hospital  Patient/family agrees with progress made and goals achieved: Yes  OT Discharge Precautions/Restrictions  Precautions Precautions: Fall Precaution Comments: bilateral BKA with prosthetics Restrictions Weight Bearing Restrictions: No Other Position/Activity Restrictions: Per MD and PA no weight bearing through hands but no formal order written. Per MD, no wearing prosthetics due to wounds Pain Pain Assessment Pain Scale: 0-10 Pain Score: 10-Worst pain ever Pain Type: Acute pain Pain Location: Hand Pain Orientation: Right;Left Pain Descriptors / Indicators: Aching Pain Intervention(s): Repositioned;Other (Comment) (medicated just prior to session) ADL ADL Eating: Modified independent Where Assessed-Eating: Edge of bed Grooming: Modified independent Where Assessed-Grooming: Sitting at sink Upper Body Bathing: Supervision/safety Where Assessed-Upper Body Bathing: Edge of bed Lower Body Bathing:  Supervision/safety Where Assessed-Lower Body Bathing: Edge of bed Upper Body Dressing: Independent Where Assessed-Upper Body Dressing: Edge of bed Lower Body Dressing: Supervision/safety Where Assessed-Lower Body Dressing: Edge of bed Toileting: Supervision/safety Where Assessed-Toileting: Bedside Commode Toilet Transfer: Close supervision Toilet Transfer Method: Other (comment) (A/P transfer bed <> BSC) Toilet Transfer Equipment: Radiographer, therapeutic: Not assessed Social research officer, government: Not assessed Vision Baseline Vision/History: Wears glasses Wears Glasses: Reading only Patient Visual Report: No change from baseline Vision Assessment?: No apparent visual deficits Perception  Perception: Within Functional Limits Praxis Praxis: Intact Cognition Overall Cognitive Status: Within Functional Limits for tasks assessed Arousal/Alertness: Awake/alert Orientation Level: Oriented X4 Attention: Selective Selective Attention: Appears intact Memory: Appears intact Awareness: Appears intact Problem Solving: Appears intact Safety/Judgment: Appears intact Sensation Sensation Light Touch: Appears Intact Proprioception: Appears Intact Coordination Gross Motor Movements are Fluid and Coordinated: No Fine Motor Movements are Fluid and Coordinated: No Coordination and Movement Description: grossly uncoordinated due to bilateral BKA and inability to wear prosthetics, bilateral digit amputations with recommendation to avoid weight bearing through hands, decreased trunk control, generalized weakness, and poor endurance Finger Nose Finger Test: unable to formally assess due to digit amputations Motor  Motor Motor: Abnormal postural alignment and control Motor - Skilled Clinical Observations: grossly uncoordinated due to bilateral BKA and inability to wear prosthetics, bilateral digit amputations with recommendation to avoid weight bearing through hands, decreased trunk control,  generalized weakness, and poor endurance Mobility  Bed Mobility Bed Mobility: Rolling Right;Rolling Left;Supine to Sit;Sit to Supine Rolling Right: Independent with assistive device Rolling Left: Independent with assistive device Supine to Sit: Independent with assistive device Sit to Supine: Independent with assistive device  Trunk/Postural Assessment  Cervical Assessment Cervical Assessment: Within Functional Limits Thoracic Assessment Thoracic Assessment: Within Functional Limits Lumbar Assessment Lumbar Assessment: Exceptions to [facility] (posterior pelvic tilt) Postural Control  Postural Control: Deficits on evaluation  Balance Balance Balance Assessed: Yes Static Sitting Balance Static Sitting - Balance Support: Feet unsupported;Bilateral upper extremity supported Static Sitting - Level of Assistance: 7: Independent Dynamic Sitting Balance Dynamic Sitting - Balance Support: Feet unsupported;Bilateral upper extremity supported Dynamic Sitting - Level of Assistance: 7: Independent Extremity/Trunk Assessment RUE Assessment RUE Assessment: Exceptions to [facility] Active Range of Motion (AROM) Comments: WFLS for elbows and shoulders General Strength Comments: Shoulder and elbow strength at least 4/5 noted during functional use.  No significant hand function available secondary to amputation of all digits except for thumb and 5th digit.  Bulky dressings in place as well. LUE Assessment LUE Assessment: Exceptions to [facility] Active Range of Motion (AROM) Comments: AROM WFLs for elbow and shoulder General Strength Comments: Grossly 4/5 in shoulders and elbows but not formally assessed. Pt with index finger amputation and bulky dressings in the hand limiting FM coordination.  He is able to somewhat oppose thumb to all digits   Nirvi Boehler, [facility] 10/15/2020, 8:27 AM

## 2020-10-15 NOTE — Progress Notes (Signed)
[facility] Progress Note   Subjective:   In room this AM - resting in bed, quiet.  No complaints For HD today D/C home tomorrow.  Objective  Vitals:   10/14/20 1031 10/14/20 1509 10/14/20 2007 10/15/20 0357  BP: (!) 155/73 (!) 152/78 (!) 170/80 (!) 167/78  Pulse: 70 70 69 67  Resp:  18 17 18   Temp:  99.4 F (37.4 C) 98.5 F (36.9 C) 98.7 F (37.1 C)  TempSrc:  Oral Oral Oral  SpO2:  97% 96%   Weight:      Height:       Physical Exam General: WNWD , nad  Heart: S1,S2 RRR No M/R/G Lungs: CTAB Abdomen: S, NT active BS Extremities: Bilateral BKA no stump edema. Ace wrap both hands.  Dialysis Access: L AVF + bruit   Additional Objective Labs: Basic Metabolic Panel: Recent Labs  Lab 10/08/20 1433 10/11/20 1443 10/13/20 1326  NA 133* 133* 132*  K 4.2 4.8 4.5  CL 93* 92* 93*  CO2 24 22 24   GLUCOSE 102* 79 113*  BUN 60* 69* 50*  CREATININE 9.85* 11.28* 9.15*  CALCIUM 8.9 8.8* 9.0  PHOS 7.3* 7.3* 6.1*   Liver Function Tests: Recent Labs  Lab 10/08/20 1433 10/11/20 1443 10/13/20 1326  ALBUMIN 2.7* 2.8* 2.8*   No results for input(s): LIPASE, AMYLASE in the last 168 hours. CBC: Recent Labs  Lab 10/08/20 1433 10/08/20 1433 10/11/20 1443 10/13/20 1327 10/15/20 1030  WBC 9.9   < > 11.6* 10.3 9.0  HGB 7.8*   < > 8.0* 8.4* 9.7*  HCT 25.9*   < > 27.1* 28.2* 32.4*  MCV 75.1*  --  76.3* 76.6* 77.7*  PLT 224   < > 214 184 162   < > = values in this interval not displayed.   Blood Culture    Component Value Date/Time   SDES WOUND 10/01/2020 1140   SPECREQUEST LEFT HAND SPEC A 10/01/2020 1140   CULT  10/01/2020 1140    FEW PROTEUS PENNERI FEW MORGANELLA MORGANII NO ANAEROBES ISOLATED Performed at [facility], [place] [address] [place], [place] [address]    REPTSTATUS 10/06/2020 FINAL 10/01/2020 1140    Cardiac Enzymes: No results for input(s): CKTOTAL, CKMB, CKMBINDEX, TROPONINI in the last 168 hours. CBG: No results for  input(s): GLUCAP in the last 168 hours. Iron Studies:  No results for input(s): IRON, TIBC, TRANSFERRIN, FERRITIN in the last 72 hours. @lablastinr3 @ Studies/Results: No results found. Medications: . iron sucrose Stopped (10/13/20 1833)   . acetaminophen  650 mg Oral TID   Or  . acetaminophen  650 mg Rectal TID  . amLODipine  10 mg Oral Daily  . atorvastatin  80 mg Oral QHS  . calcitRIOL  1.8 mcg Intravenous Q M,W,F-HD  . carvedilol  12.5 mg Oral Q breakfast  . carvedilol  12.5 mg Oral Once per day on Sun Tue Thu Sat  . Chlorhexidine Gluconate Cloth  6 each Topical Q0600  . darbepoetin (ARANESP) injection - DIALYSIS  200 mcg Intravenous Q Mon-HD  . DULoxetine  30 mg Oral Daily  . heparin  5,000 Units Subcutaneous Q8H  . hydrALAZINE  75 mg Oral Q8H  . melatonin  3 mg Oral QHS  . multivitamin  1 tablet Oral Q M,W,F-HD  . oxyCODONE  10 mg Oral Q12H  . polyethylene glycol  17 g Oral BID  . pregabalin  100 mg Oral QHS  . senna-docusate  2 tablet Oral BID  .  sevelamer carbonate  3,200 mg Oral TID WC  . sulfamethoxazole-trimethoprim  1 tablet Oral Daily  . white petrolatum   Topical BID   Dialysis Orders: GO MWF 4h 82min 200NRe 425/800 EDW 84kg 2K/2Ca L AVF No heparin  Venofer 100mg  IV x 10 (until 11/15) Mircera 150 q 2 weeks (last 10/15)  Calcitriol 1.75 TIW  Assessment/Plan: 1. PVD/L hand ischemia/infection. S/p recent amp L index finger.S/pOR washout on 10/29.Now in CIR. Planning for discharge Sat per patient  3. ESRD -HD MWF.Next HD today. 4. Hypertension/volume - Continues to be hypertensive despite lowering wt. Hydralazine - ^ dose due to persistant HTN. Will have Lower EDW on DC.  Volume ok. Is probably at EDW now -only tolerated 1.4L UF on 11/10.  5. Anemia - Hgb8.4, trend improving to 9.7 today. Aranesp increased 200 q Monday. Tsat 26% Load with load with iron X 5 doses with HD 6. Metabolic bone disease -Calcium controlled.PO4 elevated, but down trending  after increase in binders.  7. DMT2 -per primary  Jannifer Hick MD [facility] Pager [phone]

## 2020-10-15 NOTE — Progress Notes (Signed)
Physical Therapy Session Note  Patient Details  Name: [name] MRN: [id number] Date of Birth: 03/19/1961  Today's Date: 10/15/2020 PT Individual Time: 5449-2010 and 1100-1115 PT Individual Time Calculation (min): 43 min and 15 min Today's Date: 10/15/2020 PT Missed Time: 32 Minutes and 45 minutes Missed Time Reason: Patient fatigue;Other (Comment) (constipation)  Short Term Goals: Week 1:  PT Short Term Goal 1 (Week 1): pt will transfer stand<>pivot with LRAD CGA PT Short Term Goal 2 (Week 1): Pt will ambulate 60ft with LRAD CGA PT Short Term Goal 3 (Week 1): Pt will perform simulated car transfer with LRAD CGA  Skilled Therapeutic Interventions/Progress Updates:   Treatment Session 1: [id number] 43 min Received pt supine in bed with legs extended and prosthetics on stretching out knees. MD present for morning rounds and to inspect bilateral hand wounds, pt agreeable to therapy, and reported pain 9/10 in bilateral hands. RN notified but stated pt recently received pain medication. Repositioning, rest breaks, and distraction done to reduce pain levels. Session with emphasis on discharge planning, functional mobility/transfers, generalized strengthening, and improved activity tolerance. Pt with questions regarding how he will get home tomorrow. Discussed with CSW and informed pt he will be leaving via ambulance and his loaner power WC will be delivered to his house; pt in agreement. Therapist re-wrapped bilateral hands with gauze and ace wraps total A and pt reported decrease in pain. Pt agreeable to get into power WC and performed bed mobility independently and transferred bed<>power WC via PA transer with close supervision weight bearing through elbows only. Pt then repoted urge to use restroom and requested to ambulate into bathroom with prosthetics. Pt stated his new liners were delivered this morning. However, per MD and prosthetist, pt not cleared to stand or ambulate on prosthetics due  to wounds. Therapist encouraged pt to perform AP/PA transfer onto bedside commode however pt then reported his urge to toilet faded and requested to rest in bed. Therapist encouraged OOB mobility in preparation for discharge tomorrow however pt insisting on resting due to c/o constipation and fatigue. Concluded session with pt supine in bed, needs within reach, and bed alarm on. 32 minutes missed of skilled physical therapy due to fatigue and constipation.   Treatment Session 2: 1100-1115 15 min  Received pt supine in bed with continued reports of constipation and pain in BUE. Therapist suggested getting on bedside commode but pt stated "not right now." RN notified of pain and constipation and administered pain medication as well  as suppository during session. Therapist encouraged OOB mobility to assist in moving bowels however pt politely declined. Therapist reviewed HEP with pt educating on frequency, duration, and technique and pt verbalized understanding. Therapist assisted with repositioning BUE on pillows for elevation to assist with pain; pt thankful. Concluded session with pt supine in bed, needs within reach, and bed alarm on. 45 minutes missed of skilled physical therapy due to fatigue and constipation.   Therapy Documentation Precautions:  Precautions Precautions: Fall Precaution Comments: bilateral prosthetics Restrictions Weight Bearing Restrictions: No Other Position/Activity Restrictions: MD and PA stated no weight bearing through hands but no formal order written  Therapy/Group: Individual Therapy [name] PT, DPT   10/15/2020, 7:19 AM

## 2020-10-15 NOTE — Progress Notes (Signed)
Physical Therapy Discharge Summary  Patient Details  Name: [name] MRN: [id number] Date of Birth: Dec 02, 1961  Patient has met 4 of 5 long term goals due to improved activity tolerance, improved balance, improved postural control, increased strength and improved coordination.  Patient to discharge at a wheelchair level Modified Independent/Supervision. Pt's family did not attend family education training, however pt to discharge home at a Mod I/supervision level overall performing AP/PA transfers only; no standing/gait due to wounds. Pt reports his wife will be able to provide assist as needed. Pt with no further questions regarding mobility.   Reason goals not met: Pt did not meet bed<>chair transfer goal of Mod I. Pt requires supervision for AP/PA transfers to/from power chair due to inability to weight bear through BUE, decreased trunk control and generalized weakness, and increased distance between power WC and surface pt is transferring to/from.   Recommendation:  Patient will benefit from ongoing skilled PT services in home health setting to continue to advance safe functional mobility, address ongoing impairments in transfers, generalized strengthening, amputee education, dynamic sitting/standing balance/coordination, ambulation, endurance, and to minimize fall risk.  Equipment: loaner power wheelchair   Reasons for discharge: treatment goals met  Patient/family agrees with progress made and goals achieved: Yes  PT Discharge Precautions/Restrictions Precautions Precautions: Fall Precaution Comments: bilateral BKA with prosthetics Restrictions Weight Bearing Restrictions: No Other Position/Activity Restrictions: Per MD and PA no weight bearing through hands but no formal order written. Per MD, no wearing prosthetics due to wounds Cognition Overall Cognitive Status: Within Functional Limits for tasks assessed Arousal/Alertness: Awake/alert Orientation Level: Oriented  X4 Memory: Appears intact Awareness: Appears intact Problem Solving: Appears intact Safety/Judgment: Appears intact Sensation Sensation Light Touch: Impaired by gross assessment Proprioception: Appears Intact Additional Comments: decreased sensation along R calf Coordination Gross Motor Movements are Fluid and Coordinated: No Fine Motor Movements are Fluid and Coordinated: No Coordination and Movement Description: grossly uncoordinated due to bilateral BKA and inability to wear prosthetics, bilateral digit amputations with recommendation to avoid weight bearing through hands, decreased trunk control, generalized weakness, and poor endurance Finger Nose Finger Test: unable to formally assess due to digit amputations Heel Shin Test: unable to complete full ROM due to bilateral BKA but hip flexibility WFL Motor  Motor Motor: Abnormal postural alignment and control Motor - Skilled Clinical Observations: grossly uncoordinated due to bilateral BKA and inability to wear prosthetics, bilateral digit amputations with recommendation to avoid weight bearing through hands, decreased trunk control, generalized weakness, and poor endurance  Mobility Bed Mobility Bed Mobility: Rolling Right;Rolling Left;Sit to Supine;Supine to Sit Rolling Right: Independent with assistive device Rolling Left: Independent with assistive device Supine to Sit: Independent with assistive device Sit to Supine: Independent with assistive device Transfers Transfers: Anterior-Posterior Transfer Anterior-Posterior Transfer: Supervision/Verbal cueing (power WC) Transfer (Assistive device): Other (Comment) (power WC) Locomotion  Gait Ambulation: No Gait Gait: No Stairs / Additional Locomotion Stairs: No Architect: Yes Wheelchair Assistance: Independent with Camera operator: Power Wheelchair Parts Management: Independent Distance: >552f  Trunk/Postural Assessment   Cervical Assessment Cervical Assessment: Within FScientist, physiologicalAssessment: Within Functional Limits Lumbar Assessment Lumbar Assessment: Exceptions to [facility](posterior pelvic tilt) Postural Control Postural Control: Deficits on evaluation  Balance Balance Balance Assessed: Yes Static Sitting Balance Static Sitting - Balance Support: Feet unsupported;Bilateral upper extremity supported Static Sitting - Level of Assistance: 7: Independent Dynamic Sitting Balance Dynamic Sitting - Balance Support: Feet unsupported;Bilateral upper extremity supported Dynamic Sitting - Level  of Assistance: 7: Independent Extremity Assessment  RLE Assessment RLE Assessment: Exceptions to [facility] General Strength Comments: grossly generalized to 4+/5 (hip flexion, knee flexion/extension, hip abd/add) [name] Assessment [name] Assessment: Exceptions to [facility] General Strength Comments: grossly generalized to 4+/5 (hip flexion, knee flexion/extension, hip abd/add)   Alfonse Alpers PT, DPT  10/15/2020, 7:45 AM

## 2020-10-15 NOTE — Progress Notes (Addendum)
Patient ID: [name], male   DOB: [birth year], 59 y.o.   MRN: [id number] Pt's power chair to be picked up and taken to home today. Pt will need to transport home via ambulance due to he has no wheelchair here and to make sure safely gets home. Will complete ambulance packet for weekend staff. Pt is aware and agreeable.   3:00 pm DC packet placed in pt's soft chart to be given to ambulance on Sat to go home. Bedside RN aware and will pass on to next shift.

## 2020-10-16 NOTE — Progress Notes (Signed)
[facility] PHYSICAL MEDICINE & REHABILITATION PROGRESS NOTE  Subjective/Complaints:   Pt reports ready for d/c- leaving this AM- has no issues- ready to go. Explained nursing will go over his meds/d/c papers with him before d/c.   ROS:  Pt denies SOB, abd pain, CP, N/V/C/D, and vision changes   Objective: Vital Signs: Blood pressure (!) 180/85, pulse 87, temperature 98.6 F (37 C), resp. rate 18, height 6\' 4"  (1.93 m), weight 81.6 kg, SpO2 96 %. No results found. Recent Labs    10/13/20 1327 10/15/20 1030  WBC 10.3 9.0  HGB 8.4* 9.7*  HCT 28.2* 32.4*  PLT 184 162   Recent Labs    10/13/20 1326 10/15/20 1030  NA 132* 136  K 4.5 4.5  CL 93* 97*  CO2 24 24  GLUCOSE 113* 86  BUN 50* 35*  CREATININE 9.15* 8.58*  CALCIUM 9.0 9.7    Intake/Output Summary (Last 24 hours) at 10/16/2020 1052 Last data filed at 10/16/2020 0813 Gross per 24 hour  Intake 240 ml  Output 1500 ml  Net -1260 ml        Physical Exam: BP (!) 180/85 (BP Location: Right Arm)   Pulse 87   Temp 98.6 F (37 C)   Resp 18   Ht 6\' 4"  (1.93 m)   Wt 81.6 kg   SpO2 96%   BMI 21.90 kg/m  Constitutional: No distress . Vital signs reviewed. Laying in bed- appropriate, NAD HENT: Normocephalic.  Atraumatic. Eyes: EOMI. No discharge. Cardiovascular: RRR Respiratory: CTA B/L- no W/R/R- good air movement GI: Soft, NT, ND, (+)BS  Skin: Warm and dry.  Intact. Right hand with dorsal area above 2-3 MCP joints with good granulation tissue Left hand with dorsal area above second MCP joint with slough and escar Right upper extremity with dry gangrene distal fifth digit > distal first digit, stable Right knee with patella and right lateral tibial DTI Left distal stump with callous and verrucous hyperplasia  Psych: Flat. Normal behavior. Musc: Bilateral BKA B/l hands with edema and tenderness Neuro: Alert Motor: Makes eye contact with examiner.  Follows commands. Motor: Bilateral upper extremities:  Proximally 5/5, distally limited due to pain, unchanged Bilateral lower extremities: Hip flexion, knee extension 5/5, stable  Assessment/Plan: 1. Functional deficits secondary to PVD s/p amputation of digits in bilateral hands with history of bilateral BKA which require 3+ hours per day of interdisciplinary therapy in a comprehensive inpatient rehab setting.  Physiatrist is providing close team supervision and 24 hour management of active medical problems listed below.  Physiatrist and rehab team continue to assess barriers to discharge/monitor patient progress toward functional and medical goals   Care Tool:  Bathing  Bathing activity did not occur: Refused Body parts bathed by patient: Right arm, Left arm, Chest, Abdomen, Front perineal area, Right upper leg, Left upper leg, Face, Buttocks   Body parts bathed by helper: Buttocks Body parts n/a: Right lower leg, Left lower leg   Bathing assist Assist Level: Supervision/Verbal cueing     Upper Body Dressing/Undressing Upper body dressing   What is the patient wearing?: Pull over shirt    Upper body assist Assist Level: Independent    Lower Body Dressing/Undressing Lower body dressing      What is the patient wearing?: Pants, Underwear/pull up     Lower body assist Assist for lower body dressing: Supervision/Verbal cueing     Toileting Toileting Toileting Activity did not occur (Clothing management and hygiene only): N/A (no void or  bm)  Toileting assist Assist for toileting: Set up assist     Transfers Chair/bed transfer  Transfers assist     Chair/bed transfer assist level: Supervision/Verbal cueing (AP/PA transfer)     Locomotion Ambulation   Ambulation assist   Ambulation activity did not occur: Safety/medical concerns (Per PA and MD no standing on prosthetics due to wounds and to avoid weight bearing through BUE due to digit amputations)  Assist level: Minimal Assistance - Patient > 75% Assistive  device: Walker-platform Max distance: 10'   Walk 10 feet activity   Assist  Walk 10 feet activity did not occur: Safety/medical concerns (Per PA and MD no standing on prosthetics due to wounds and to avoid weight bearing through BUE due to digit amputations)        Walk 50 feet activity   Assist Walk 50 feet with 2 turns activity did not occur: Safety/medical concerns (Per PA and MD no standing on prosthetics due to wounds and to avoid weight bearing through BUE due to digit amputations)         Walk 150 feet activity   Assist Walk 150 feet activity did not occur: Safety/medical concerns (Per PA and MD no standing on prosthetics due to wounds and to avoid weight bearing through BUE due to digit amputations)         Walk 10 feet on uneven surface  activity   Assist Walk 10 feet on uneven surfaces activity did not occur: Safety/medical concerns (Per PA and MD no standing on prosthetics due to wounds and to avoid weight bearing through BUE due to digit amputations)         Wheelchair     Assist Will patient use wheelchair at discharge?: Yes Type of Wheelchair: Manual Wheelchair activity did not occur: Safety/medical concerns (not able to bear weight through hands and did not don R prosthetic due to wound)  Wheelchair assist level: Independent Max wheelchair distance: >593ft    Wheelchair 50 feet with 2 turns activity    Assist    Wheelchair 50 feet with 2 turns activity did not occur: Safety/medical concerns (not able to bear weight through hands and did not don R prosthetic due to wound)   Assist Level: Independent   Wheelchair 150 feet activity     Assist  Wheelchair 150 feet activity did not occur: Safety/medical concerns (not able to bear weight through hands and did not don R prosthetic due to wound)   Assist Level: Independent    Medical Problem List and Plan: 1.  Decreased functional mobility with failure to thrive secondary to severe  PVD with history of bilateral BKA as well as status post amputation of multiple fingers critical limb ischemia.  Status post I&D of both hands 10/01/2020 per Dr. Burney Gauze. Orthopedic services advises to minimize weightbearing through hand as much as possible.  Continue CIR, plan for discharge tomorrow, ramp in place  Will see patient for transitional care management in 1-2 weeks post-discharge 2.  Antithrombotics: -DVT/anticoagulation: Subcutaneous heparin 5000U q8H              -antiplatelet therapy: N/A 3. Pain Management: Lyrica 100 mg nightly, oxycodone as needed             Monitor with increased exertion  Added Tylenol TID 11/5  Increased cymbalta to 30mg  11/7  Oxycontin 10 every 12 started on 11/10  Relatively controlled with meds on 11/12  11/13- d/c today with 7 days of meds- will need refill at 7  day mark 4. Mood: Provide emotional support             -antipsychotic agents: N/A 5. Neuropsych: This patient is capable of making decisions on his own behalf. 6. Skin/Wound Care: Routine skin checks with wound care nurse follow-up  Discussed with [facility] again, lower extremity prosthesis not to be worn at present - plan for new sockets and liners as it has been greater than 5 years prosthesis and therefore transfer education/therapies 7. Fluids/Electrolytes/Nutrition: Routine and outs 8.  ID.  Wound culture left hand Proteus penneri.  Continue Bactrim for 2 weeks from 10/04/2020.  Follow-up outpatient infectious disease clinic 9.  ESRD.  Continue hemodialysis as directed             Recs per nephro 10.  Anemia of chronic disease.  Continue Aranesp.               Hemoglobin 8.4 on 11/10 11.  Hypertension.  Norvasc 10 mg daily, Coreg as directed, hydralazine 25 mg every 8 hours.               Elevated this AM, monitor in accordance with dialysis to avoid hypotension/hypoperfusion  11/13- BP 180/85 this AM- taken before BP meds- better now.  12.  Hyperlipidemia/severe PVD: Statin increased  to high-dose after discussion with pharmacy on 11/9 13.  Sleep disturbance: Started melatonin 3mg  HS on 11/6  Improving overall 14.  Leukocytosis  WBCs 10.3 on 11/10  Afebrile  Continue to monitor 15.  Drug-induced constipation  Bowel meds scheduled on 11/11, increased again on 11/12  Needs to have BM prior to d/c 16/ Dispo  11/13- d/c today with d/c paperwork  LOS: 10 days A FACE TO FACE EVALUATION WAS PERFORMED  Porshia Blizzard 10/16/2020, 10:52 AM

## 2020-10-16 NOTE — Progress Notes (Signed)
Ambulance here to pick up patient.

## 2020-10-16 NOTE — Progress Notes (Signed)
Patient for discharge today; papers in room ; received discharge instructions yesterday; no further questions noted; per social work patient will go home per ambulance and they will be here around 11am.

## 2020-10-17 ENCOUNTER — Telehealth: Payer: Self-pay | Admitting: Nephrology

## 2020-10-17 NOTE — Telephone Encounter (Signed)
Transition of Care Contact from [place]  Date of Discharge: 10/16/20 Date of Contact: 10/17/20 Method of contact: phone - attempted  Attempted to contact patient to discuss transition of care from inpatient admission.  Patient did not answer the phone.  Unable to leave message because mailbox was full.  Will attempt to call again and if unable to reach will follow up at dialysis.   Jen Mow, PA-C [facility] Pager: [phone]

## 2020-10-19 ENCOUNTER — Telehealth: Payer: Self-pay | Admitting: Internal Medicine

## 2020-10-19 ENCOUNTER — Telehealth: Payer: Self-pay | Admitting: *Deleted

## 2020-10-19 NOTE — Telephone Encounter (Signed)
Called patient's cell to offer to schedule the Palliative Consult, no answer and unable to leave a message due to mailbox was full.  I then called home number, no answer and was able to leave a message with reason for call along with my name and call back number.  I also called patient's wife, Neoma Laming, and left a message letting her know that I was trying to get in touch with patient to schedule a visit, left my name and call back number.

## 2020-10-19 NOTE — Telephone Encounter (Signed)
Donita RN [facility] called for POC 3wk1,2wk3,1wk5.  Approval given.

## 2020-10-20 NOTE — Telephone Encounter (Signed)
Spoke with patient and have scheduled an In-person Consult for 10/21/20 @ 10:30 AM.  COVID screening was negative. Patient does have a pet. He states he will put the pet away before NP comes to the home.  Consent obtained; updated Outlook/Netsmart/Team List and Epic.

## 2020-10-21 ENCOUNTER — Other Ambulatory Visit: Payer: Medicare Other | Admitting: Internal Medicine

## 2020-10-21 ENCOUNTER — Other Ambulatory Visit: Payer: Self-pay
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
## 2020-10-26 ENCOUNTER — Ambulatory Visit: Payer: Medicare Other | Admitting: Internal Medicine

## 2020-11-09 ENCOUNTER — Encounter: Payer: Medicare Other | Attending: Physical Medicine & Rehabilitation | Admitting: Physical Medicine & Rehabilitation

## 2020-11-11 DIAGNOSIS — Z87891 Personal history of nicotine dependence: Secondary | ICD-10-CM

## 2020-11-11 DIAGNOSIS — K219 Gastro-esophageal reflux disease without esophagitis: Secondary | ICD-10-CM

## 2020-11-11 DIAGNOSIS — E1140 Type 2 diabetes mellitus with diabetic neuropathy, unspecified: Secondary | ICD-10-CM

## 2020-11-11 DIAGNOSIS — Z7984 Long term (current) use of oral hypoglycemic drugs: Secondary | ICD-10-CM

## 2020-11-11 DIAGNOSIS — T8789 Other complications of amputation stump: Secondary | ICD-10-CM | POA: Diagnosis not present

## 2020-11-11 DIAGNOSIS — T8742 Infection of amputation stump, left upper extremity: Secondary | ICD-10-CM | POA: Diagnosis not present

## 2020-11-11 DIAGNOSIS — S81001D Unspecified open wound, right knee, subsequent encounter: Secondary | ICD-10-CM | POA: Diagnosis not present

## 2020-11-11 DIAGNOSIS — D631 Anemia in chronic kidney disease: Secondary | ICD-10-CM

## 2020-11-11 DIAGNOSIS — E785 Hyperlipidemia, unspecified: Secondary | ICD-10-CM

## 2020-11-11 DIAGNOSIS — B1920 Unspecified viral hepatitis C without hepatic coma: Secondary | ICD-10-CM

## 2020-11-11 DIAGNOSIS — B964 Proteus (mirabilis) (morganii) as the cause of diseases classified elsewhere: Secondary | ICD-10-CM | POA: Diagnosis not present

## 2020-11-11 DIAGNOSIS — I120 Hypertensive chronic kidney disease with stage 5 chronic kidney disease or end stage renal disease: Secondary | ICD-10-CM

## 2020-11-11 DIAGNOSIS — N186 End stage renal disease: Secondary | ICD-10-CM

## 2020-11-11 DIAGNOSIS — E1151 Type 2 diabetes mellitus with diabetic peripheral angiopathy without gangrene: Secondary | ICD-10-CM

## 2020-11-11 DIAGNOSIS — G479 Sleep disorder, unspecified: Secondary | ICD-10-CM

## 2020-11-11 DIAGNOSIS — Y792 Prosthetic and other implants, materials and accessory orthopedic devices associated with adverse incidents: Secondary | ICD-10-CM

## 2020-11-11 DIAGNOSIS — R627 Adult failure to thrive: Secondary | ICD-10-CM

## 2020-11-11 DIAGNOSIS — Z993 Dependence on wheelchair: Secondary | ICD-10-CM

## 2020-11-11 DIAGNOSIS — K5903 Drug induced constipation: Secondary | ICD-10-CM

## 2020-11-11 DIAGNOSIS — Z992 Dependence on renal dialysis: Secondary | ICD-10-CM

## 2020-11-11 DIAGNOSIS — M899 Disorder of bone, unspecified: Secondary | ICD-10-CM

## 2020-11-11 DIAGNOSIS — E1122 Type 2 diabetes mellitus with diabetic chronic kidney disease: Secondary | ICD-10-CM

## 2020-11-12 ENCOUNTER — Encounter (HOSPITAL_BASED_OUTPATIENT_CLINIC_OR_DEPARTMENT_OTHER): Payer: No Typology Code available for payment source | Admitting: Internal Medicine

## 2020-11-18 ENCOUNTER — Other Ambulatory Visit: Payer: Self-pay | Admitting: Internal Medicine

## 2020-11-18 DIAGNOSIS — L089 Local infection of the skin and subcutaneous tissue, unspecified: Secondary | ICD-10-CM

## 2020-11-24 ENCOUNTER — Encounter (HOSPITAL_COMMUNITY): Payer: Self-pay

## 2020-11-24 ENCOUNTER — Emergency Department (HOSPITAL_COMMUNITY)
Admission: EM | Admit: 2020-11-24 | Discharge: 2020-11-25 | Disposition: A | Discharge status: Home / Self Care | Payer: Medicare Other | Attending: Emergency Medicine | Admitting: Emergency Medicine

## 2020-11-24 ENCOUNTER — Other Ambulatory Visit: Payer: Self-pay

## 2020-11-24 ENCOUNTER — Emergency Department (HOSPITAL_COMMUNITY): Payer: Medicare Other

## 2020-11-24 DIAGNOSIS — Z992 Dependence on renal dialysis: Secondary | ICD-10-CM | POA: Insufficient documentation

## 2020-11-24 DIAGNOSIS — R4182 Altered mental status, unspecified: Secondary | ICD-10-CM | POA: Diagnosis present

## 2020-11-24 DIAGNOSIS — Z20822 Contact with and (suspected) exposure to covid-19: Secondary | ICD-10-CM | POA: Insufficient documentation

## 2020-11-24 DIAGNOSIS — N186 End stage renal disease: Secondary | ICD-10-CM | POA: Diagnosis not present

## 2020-11-24 DIAGNOSIS — Z794 Long term (current) use of insulin: Secondary | ICD-10-CM | POA: Diagnosis not present

## 2020-11-24 DIAGNOSIS — Z79899 Other long term (current) drug therapy: Secondary | ICD-10-CM | POA: Insufficient documentation

## 2020-11-24 DIAGNOSIS — Z87891 Personal history of nicotine dependence: Secondary | ICD-10-CM | POA: Insufficient documentation

## 2020-11-24 DIAGNOSIS — R404 Transient alteration of awareness: Secondary | ICD-10-CM | POA: Insufficient documentation

## 2020-11-24 DIAGNOSIS — I120 Hypertensive chronic kidney disease with stage 5 chronic kidney disease or end stage renal disease: Secondary | ICD-10-CM | POA: Diagnosis not present

## 2020-11-24 LAB — I-STAT CHEM 8, ED
BUN: 27 mg/dL — ABNORMAL HIGH (ref 8–23)
Calcium, Ion: 1.01 mmol/L — ABNORMAL LOW (ref 1.15–1.40)
Chloride: 93 mmol/L — ABNORMAL LOW (ref 98–111)
Creatinine, Ser: 6 mg/dL — ABNORMAL HIGH (ref 0.61–1.24)
Glucose, Bld: 92 mg/dL (ref 70–99)
HCT: 34 % — ABNORMAL LOW (ref 39.0–52.0)
Hemoglobin: 11.6 g/dL — ABNORMAL LOW (ref 13.0–17.0)
Potassium: 3.2 mmol/L — ABNORMAL LOW (ref 3.5–5.1)
Sodium: 136 mmol/L (ref 135–145)
TCO2: 29 mmol/L (ref 22–32)

## 2020-11-24 LAB — COMPREHENSIVE METABOLIC PANEL
ALT: 11 U/L (ref 0–44)
AST: 16 U/L (ref 15–41)
Albumin: 3 g/dL — ABNORMAL LOW (ref 3.5–5.0)
Alkaline Phosphatase: 64 U/L (ref 38–126)
Anion gap: 16 — ABNORMAL HIGH (ref 5–15)
BUN: 25 mg/dL — ABNORMAL HIGH (ref 8–23)
CO2: 29 mmol/L (ref 22–32)
Calcium: 8.7 mg/dL — ABNORMAL LOW (ref 8.9–10.3)
Chloride: 91 mmol/L — ABNORMAL LOW (ref 98–111)
Creatinine, Ser: 6.23 mg/dL — ABNORMAL HIGH (ref 0.61–1.24)
GFR, Estimated: 10 mL/min — ABNORMAL LOW (ref >60)
Glucose, Bld: 93 mg/dL (ref 70–99)
Potassium: 3.1 mmol/L — ABNORMAL LOW (ref 3.5–5.1)
Sodium: 136 mmol/L (ref 135–145)
Total Bilirubin: 1.5 mg/dL — ABNORMAL HIGH (ref 0.3–1.2)
Total Protein: 8 g/dL (ref 6.5–8.1)

## 2020-11-24 LAB — CBC WITH DIFFERENTIAL/PLATELET
Abs Immature Granulocytes: 0.05 10*3/uL (ref 0.00–0.07)
Basophils Absolute: 0.1 10*3/uL (ref 0.0–0.1)
Basophils Relative: 1 %
Eosinophils Absolute: 0.1 10*3/uL (ref 0.0–0.5)
Eosinophils Relative: 1 %
HCT: 30 % — ABNORMAL LOW (ref 39.0–52.0)
Hemoglobin: 9.5 g/dL — ABNORMAL LOW (ref 13.0–17.0)
Immature Granulocytes: 0 %
Lymphocytes Relative: 8 %
Lymphs Abs: 0.8 10*3/uL (ref 0.7–4.0)
MCH: 23.3 pg — ABNORMAL LOW (ref 26.0–34.0)
MCHC: 31.7 g/dL (ref 30.0–36.0)
MCV: 73.5 fL — ABNORMAL LOW (ref 80.0–100.0)
Monocytes Absolute: 0.7 10*3/uL (ref 0.1–1.0)
Monocytes Relative: 6 %
Neutro Abs: 9.5 10*3/uL — ABNORMAL HIGH (ref 1.7–7.7)
Neutrophils Relative %: 84 %
Platelets: 187 10*3/uL (ref 150–400)
RBC: 4.08 MIL/uL — ABNORMAL LOW (ref 4.22–5.81)
RDW: 18.6 % — ABNORMAL HIGH (ref 11.5–15.5)
WBC: 11.2 10*3/uL — ABNORMAL HIGH (ref 4.0–10.5)
nRBC: 0 % (ref 0.0–0.2)

## 2020-11-24 LAB — RESP PANEL BY RT-PCR (FLU A&B, COVID) ARPGX2
Influenza A by PCR: NEGATIVE
Influenza B by PCR: NEGATIVE
SARS Coronavirus 2 by RT PCR: NEGATIVE

## 2020-11-24 LAB — CBG MONITORING, ED: Glucose-Capillary: 88 mg/dL (ref 70–99)

## 2020-11-24 LAB — AMMONIA: Ammonia: 17 umol/L (ref 9–35)

## 2020-11-24 NOTE — ED Notes (Signed)
Pt arrived to ED from outpatient dialysis w/ fistula still accessed. Placed order for IV team to deaccess fistula.

## 2020-11-24 NOTE — ED Triage Notes (Signed)
Pt arrives via EMS from HD where they finished treatment 20 mins early. During HD, pt fell asleep and staff was unable to wake pt and gave narcan. Per HD, pt normally A&O and able to walk with double prosthetic legs. EMS unable to get BP or sats on pt. CBG 125.
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
## 2020-11-24 NOTE — ED Notes (Signed)
Secretary Licensed conveyancer for pt

## 2020-11-24 NOTE — ED Notes (Signed)
Pt transported to CT at this time.

## 2020-11-24 NOTE — ED Notes (Signed)
[facility] called. Pt is 4th in line.

## 2020-11-30 ENCOUNTER — Other Ambulatory Visit: Payer: Medicare Other | Admitting: Internal Medicine

## 2020-12-02 ENCOUNTER — Encounter: Payer: Medicare Other | Admitting: Physical Medicine & Rehabilitation

## 2020-12-14 ENCOUNTER — Encounter (HOSPITAL_BASED_OUTPATIENT_CLINIC_OR_DEPARTMENT_OTHER): Payer: No Typology Code available for payment source | Attending: Internal Medicine | Admitting: Internal Medicine

## 2021-01-18 ENCOUNTER — Other Ambulatory Visit: Payer: Self-pay

## 2021-01-18 ENCOUNTER — Encounter (HOSPITAL_BASED_OUTPATIENT_CLINIC_OR_DEPARTMENT_OTHER): Payer: Medicare Other | Attending: Internal Medicine | Admitting: Internal Medicine

## 2021-01-18 DIAGNOSIS — R569 Unspecified convulsions: Secondary | ICD-10-CM | POA: Diagnosis not present

## 2021-01-18 NOTE — Progress Notes (Signed)
[name] Kitchen ([id number]) , Visit Report for 01/18/2021 Abuse/Suicide Risk Screen Details Patient Name: Date of Service: [name], [name] 01/18/2021 2:45 PM Medical Record Number: [id number] Patient Account Number: [id number] Date of Birth/Sex: Treating RN: 08-16-1961 (60 y.o. [name] Primary Care [name]: [name] Other Clinician: Referring Sway Guttierrez: Treating [name]/Extender: [name], [name]: 0 Abuse/Suicide Risk Screen Items Answer ABUSE RISK SCREEN: Has anyone close to you tried to hurt or harm you recentlyo No Do you feel uncomfortable with anyone in your familyo No Has anyone forced you do things that you didnt want to doo No Electronic Signature(s) Signed: 01/18/2021 5:47:53 PM By: [name] Gouty RN, BSN Entered By: [name] on 01/18/2021 14:55:47 -------------------------------------------------------------------------------- Activities of Daily Living Details Patient Name: Date of Service: [name] 01/18/2021 2:45 PM Medical Record Number: [id number] Patient Account Number: [id number] Date of Birth/Sex: Treating RN: 10-04-1961 (60 y.o. [name] Primary Care [name]: [name] Other Clinician: Referring Ezekeil Bethel: Treating Jaria Conway/Extender: [name], [name]: 0 Activities of Daily Living Items Answer Activities of Daily Living (Please select one for each item) Drive Automobile Not Able T Medications ake Need Assistance Use T elephone Completely Able Care for Appearance Need Assistance Use T oilet Need Assistance Bath / Shower Need Assistance Dress Self Need Assistance Feed Self Completely Able Walk Not Able Get In / Out Bed Need Assistance Housework Not Able Prepare Meals Not Able Handle Money Completely Able Shop for Self Need Assistance Electronic Signature(s) Signed: 01/18/2021 5:47:53 PM By: [name] Gouty RN, BSN Entered By:  [name] on 01/18/2021 14:56:36 -------------------------------------------------------------------------------- Education Screening Details Patient Name: Date of Service: [name] Cane A. 01/18/2021 2:45 PM Medical Record Number: [id number] Patient Account Number: [id number] Date of Birth/Sex: Treating RN: 08-15-1961 (60 y.o. [name] Primary Care [name]: [name] Other Clinician: Referring [name]: Treating [name]/Extender: [name], [name]: 0 Primary Learner Assessed: Patient Learning Preferences/Education Level/Primary Language Learning Preference: Explanation, Demonstration, Printed Material Highest Education Level: College or Above Preferred Language: English Cognitive Barrier Language Barrier: No Translator Needed: No Memory Deficit: No Emotional Barrier: No Cultural/Religious Beliefs Affecting Medical Care: No Physical Barrier Impaired Vision: No Impaired Hearing: No Decreased Hand dexterity: Yes 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 Knowledge/Comprehension Knowledge Level: High Comprehension Level: High Ability to understand written instructions: High Ability to understand verbal instructions: High Motivation Anxiety Level: Calm Cooperation: Cooperative Education Importance: Acknowledges Need Interest in Health Problems: Asks Questions Perception: Coherent Willingness to Engage in Self-Management High Activities: Readiness to Engage in Self-Management High Activities: Electronic Signature(s) Signed: 01/18/2021 5:47:53 PM By: [name] Gouty RN, BSN Entered By: [name] on 01/18/2021 14:57:25 -------------------------------------------------------------------------------- Fall Risk Assessment Details Patient Name: Date of Service: [name] Cane A. 01/18/2021 2:45 PM Medical Record Number: [id number] Patient Account Number: [id number] Date of Birth/Sex: Treating  RN: 1961/06/05 (60 y.o. [name] Primary Care [name]: [name] Other Clinician: Referring Talyn Eddie: Treating [name]/Extender: [name], [name]: 0 Fall Risk Assessment Items Have you had 2 or more falls in the last 12 monthso 0 Yes Have you had any fall that resulted in injury in the last 12 monthso 0 No FALLS RISK SCREEN History of falling - immediate or within 3 months 25 Yes Secondary diagnosis (Do you have 2 or more medical diagnoseso) 0 No Ambulatory aid None/bed rest/wheelchair/nurse 0 Yes Crutches/cane/walker 0 No Furniture 0 No  Intravenous therapy Access/Saline/Heparin Lock 0 No Gait/Transferring Normal/ bed rest/ wheelchair 0 Yes Weak (short steps with or without shuffle, stooped but able to lift head while walking, may seek 0 No support from furniture) Impaired (short steps with shuffle, may have difficulty arising from chair, head down, impaired 0 No balance) Mental Status Oriented to own ability 0 Yes Electronic Signature(s) Signed: 01/18/2021 5:47:53 PM By: [name] Gouty RN, BSN Entered By: [name] on 01/18/2021 14:58:05 -------------------------------------------------------------------------------- Foot Assessment Details Patient Name: Date of Service: [name] Cane A. 01/18/2021 2:45 PM Medical Record Number: [name] Patient Account Number: [id number] Date of Birth/Sex: Treating RN: 1961/08/17 (60 y.o. 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 '[x]'$  Unable to perform left foot assessment due to amputation Site Locations + = Sensation present, - = Sensation absent, C = Callus, U = Ulcer R = Redness, W = Warmth, M = Maceration, PU = Pre-ulcerative lesion F = Fissure, S =  Swelling, D = Dryness Assessment Right: Left: Other Deformity: Prior Foot Ulcer: Prior Amputation: Charcot Joint: Ambulatory Status: Non-ambulatory Assistance Device: Wheelchair Gait: Electronic Signature(s) Signed: 01/18/2021 5:47:53 PM By: [name] Gouty RN, BSN Entered By: [name] on 01/18/2021 14:58:42 -------------------------------------------------------------------------------- Nutrition Risk Screening Details Patient Name: Date of Service: [name] Cane A. 01/18/2021 2:45 PM Medical Record Number: [name] Patient Account Number: [id number] Date of Birth/Sex: Treating RN: June 27, 1961 (60 y.o. [name] Primary Care Tywon Niday: [name] Other Clinician: Referring Jeydan Barner: Treating Chianne Byrns/Extender: [name], [name]: 0 Height (in): 76 Weight (lbs): 191 Body Mass Index (BMI): 23.2 Nutrition Risk Screening Items Score Screening NUTRITION RISK SCREEN: I have an illness or condition that made me change the kind and/or amount of food I eat 0 No I eat fewer than two meals per day 0 No I eat few fruits and vegetables, or milk products 0 No I have three or more drinks of beer, liquor or wine almost every day 0 No I have tooth or mouth problems that make it hard for me to eat 0 No I don't always have enough money to buy the food I need 0 No I eat alone most of the time 0 No I take three or more different prescribed or over-the-counter drugs a day 1 Yes Without wanting to, I have lost or gained 10 pounds in the last six months 0 No I am not always physically able to shop, cook and/or feed myself 2 Yes Nutrition Protocols Good Risk Protocol Moderate Risk Protocol 0 Provide education on nutrition High Risk Proctocol Risk Level: Moderate Risk Score: 3 Electronic Signature(s) Signed: 01/18/2021 5:47:53 PM By: [name] Gouty RN, BSN Entered By: [name] on 01/18/2021 14:58:25

## 2021-01-21 ENCOUNTER — Emergency Department (HOSPITAL_COMMUNITY): Payer: Medicare Other

## 2021-01-21 ENCOUNTER — Encounter (HOSPITAL_COMMUNITY): Payer: Self-pay

## 2021-01-21 ENCOUNTER — Inpatient Hospital Stay (HOSPITAL_COMMUNITY)
Admission: EM | Admit: 2021-01-21 | Discharge: 2021-01-23 | DRG: 100 | Disposition: A | Discharge status: Home / Self Care | Payer: Medicare Other | Attending: Internal Medicine | Admitting: Internal Medicine

## 2021-01-21 ENCOUNTER — Inpatient Hospital Stay (HOSPITAL_COMMUNITY): Payer: Medicare Other

## 2021-01-21 DIAGNOSIS — Z89021 Acquired absence of right finger(s): Secondary | ICD-10-CM

## 2021-01-21 DIAGNOSIS — R569 Unspecified convulsions: Secondary | ICD-10-CM | POA: Diagnosis present

## 2021-01-21 DIAGNOSIS — Z794 Long term (current) use of insulin: Secondary | ICD-10-CM

## 2021-01-21 DIAGNOSIS — Z833 Family history of diabetes mellitus: Secondary | ICD-10-CM

## 2021-01-21 DIAGNOSIS — Z79899 Other long term (current) drug therapy: Secondary | ICD-10-CM | POA: Diagnosis not present

## 2021-01-21 DIAGNOSIS — Z20822 Contact with and (suspected) exposure to covid-19: Secondary | ICD-10-CM | POA: Diagnosis present

## 2021-01-21 DIAGNOSIS — E1151 Type 2 diabetes mellitus with diabetic peripheral angiopathy without gangrene: Secondary | ICD-10-CM | POA: Diagnosis present

## 2021-01-21 DIAGNOSIS — N186 End stage renal disease: Secondary | ICD-10-CM

## 2021-01-21 DIAGNOSIS — G9340 Encephalopathy, unspecified: Secondary | ICD-10-CM | POA: Diagnosis present

## 2021-01-21 DIAGNOSIS — D631 Anemia in chronic kidney disease: Secondary | ICD-10-CM | POA: Diagnosis present

## 2021-01-21 DIAGNOSIS — L89894 Pressure ulcer of other site, stage 4: Secondary | ICD-10-CM | POA: Diagnosis present

## 2021-01-21 DIAGNOSIS — E785 Hyperlipidemia, unspecified: Secondary | ICD-10-CM | POA: Diagnosis present

## 2021-01-21 DIAGNOSIS — L89899 Pressure ulcer of other site, unspecified stage: Secondary | ICD-10-CM | POA: Diagnosis not present

## 2021-01-21 DIAGNOSIS — Z992 Dependence on renal dialysis: Secondary | ICD-10-CM

## 2021-01-21 DIAGNOSIS — K219 Gastro-esophageal reflux disease without esophagitis: Secondary | ICD-10-CM | POA: Diagnosis present

## 2021-01-21 DIAGNOSIS — Z89512 Acquired absence of left leg below knee: Secondary | ICD-10-CM | POA: Diagnosis not present

## 2021-01-21 DIAGNOSIS — Z8249 Family history of ischemic heart disease and other diseases of the circulatory system: Secondary | ICD-10-CM | POA: Diagnosis not present

## 2021-01-21 DIAGNOSIS — Z87891 Personal history of nicotine dependence: Secondary | ICD-10-CM

## 2021-01-21 DIAGNOSIS — Z89511 Acquired absence of right leg below knee: Secondary | ICD-10-CM

## 2021-01-21 DIAGNOSIS — E11649 Type 2 diabetes mellitus with hypoglycemia without coma: Secondary | ICD-10-CM | POA: Diagnosis not present

## 2021-01-21 DIAGNOSIS — E1140 Type 2 diabetes mellitus with diabetic neuropathy, unspecified: Secondary | ICD-10-CM | POA: Diagnosis present

## 2021-01-21 DIAGNOSIS — Z9115 Patient's noncompliance with renal dialysis: Secondary | ICD-10-CM | POA: Diagnosis not present

## 2021-01-21 DIAGNOSIS — I739 Peripheral vascular disease, unspecified: Secondary | ICD-10-CM | POA: Diagnosis present

## 2021-01-21 DIAGNOSIS — E1169 Type 2 diabetes mellitus with other specified complication: Secondary | ICD-10-CM | POA: Diagnosis not present

## 2021-01-21 DIAGNOSIS — I120 Hypertensive chronic kidney disease with stage 5 chronic kidney disease or end stage renal disease: Secondary | ICD-10-CM | POA: Diagnosis present

## 2021-01-21 DIAGNOSIS — E1122 Type 2 diabetes mellitus with diabetic chronic kidney disease: Secondary | ICD-10-CM | POA: Diagnosis present

## 2021-01-21 DIAGNOSIS — Z89022 Acquired absence of left finger(s): Secondary | ICD-10-CM

## 2021-01-21 DIAGNOSIS — L8990 Pressure ulcer of unspecified site, unspecified stage: Secondary | ICD-10-CM

## 2021-01-21 DIAGNOSIS — D638 Anemia in other chronic diseases classified elsewhere: Secondary | ICD-10-CM | POA: Diagnosis present

## 2021-01-21 DIAGNOSIS — E119 Type 2 diabetes mellitus without complications: Secondary | ICD-10-CM

## 2021-01-21 DIAGNOSIS — R778 Other specified abnormalities of plasma proteins: Secondary | ICD-10-CM | POA: Diagnosis present

## 2021-01-21 DIAGNOSIS — B1920 Unspecified viral hepatitis C without hepatic coma: Secondary | ICD-10-CM | POA: Diagnosis present

## 2021-01-21 DIAGNOSIS — F32A Depression, unspecified: Secondary | ICD-10-CM | POA: Diagnosis present

## 2021-01-21 DIAGNOSIS — R0989 Other specified symptoms and signs involving the circulatory and respiratory systems: Secondary | ICD-10-CM | POA: Diagnosis present

## 2021-01-21 HISTORY — DX: Pressure ulcer of other site, unspecified stage: L89.899

## 2021-01-21 LAB — CBC WITH DIFFERENTIAL/PLATELET
Abs Immature Granulocytes: 0.11 10*3/uL — ABNORMAL HIGH (ref 0.00–0.07)
Basophils Absolute: 0.1 10*3/uL (ref 0.0–0.1)
Basophils Relative: 0 %
Eosinophils Absolute: 0.2 10*3/uL (ref 0.0–0.5)
Eosinophils Relative: 1 %
HCT: 35.1 % — ABNORMAL LOW (ref 39.0–52.0)
Hemoglobin: 10.9 g/dL — ABNORMAL LOW (ref 13.0–17.0)
Immature Granulocytes: 1 %
Lymphocytes Relative: 6 %
Lymphs Abs: 0.8 10*3/uL (ref 0.7–4.0)
MCH: 22.2 pg — ABNORMAL LOW (ref 26.0–34.0)
MCHC: 31.1 g/dL (ref 30.0–36.0)
MCV: 71.5 fL — ABNORMAL LOW (ref 80.0–100.0)
Monocytes Absolute: 1.3 10*3/uL — ABNORMAL HIGH (ref 0.1–1.0)
Monocytes Relative: 10 %
Neutro Abs: 10.8 10*3/uL — ABNORMAL HIGH (ref 1.7–7.7)
Neutrophils Relative %: 82 %
Platelets: 153 10*3/uL (ref 150–400)
RBC: 4.91 MIL/uL (ref 4.22–5.81)
RDW: 20.7 % — ABNORMAL HIGH (ref 11.5–15.5)
WBC: 13.3 10*3/uL — ABNORMAL HIGH (ref 4.0–10.5)
nRBC: 0 % (ref 0.0–0.2)

## 2021-01-21 LAB — COMPREHENSIVE METABOLIC PANEL
ALT: 14 U/L (ref 0–44)
AST: 30 U/L (ref 15–41)
Albumin: 3 g/dL — ABNORMAL LOW (ref 3.5–5.0)
Alkaline Phosphatase: 47 U/L (ref 38–126)
Anion gap: 22 — ABNORMAL HIGH (ref 5–15)
BUN: 39 mg/dL — ABNORMAL HIGH (ref 8–23)
CO2: 21 mmol/L — ABNORMAL LOW (ref 22–32)
Calcium: 7.6 mg/dL — ABNORMAL LOW (ref 8.9–10.3)
Chloride: 92 mmol/L — ABNORMAL LOW (ref 98–111)
Creatinine, Ser: 7.19 mg/dL — ABNORMAL HIGH (ref 0.61–1.24)
GFR, Estimated: 8 mL/min — ABNORMAL LOW (ref >60)
Glucose, Bld: 145 mg/dL — ABNORMAL HIGH (ref 70–99)
Potassium: 4.5 mmol/L (ref 3.5–5.1)
Sodium: 135 mmol/L (ref 135–145)
Total Bilirubin: 1.7 mg/dL — ABNORMAL HIGH (ref 0.3–1.2)
Total Protein: 8.2 g/dL — ABNORMAL HIGH (ref 6.5–8.1)

## 2021-01-21 LAB — GLUCOSE, CAPILLARY: Glucose-Capillary: 75 mg/dL (ref 70–99)

## 2021-01-21 LAB — RESP PANEL BY RT-PCR (FLU A&B, COVID) ARPGX2
Influenza A by PCR: NEGATIVE
Influenza B by PCR: NEGATIVE
SARS Coronavirus 2 by RT PCR: NEGATIVE

## 2021-01-21 LAB — TROPONIN I (HIGH SENSITIVITY)
Troponin I (High Sensitivity): 62 ng/L — ABNORMAL HIGH (ref <18)
Troponin I (High Sensitivity): 132 ng/L (ref <18)

## 2021-01-21 LAB — LIPASE, BLOOD: Lipase: 33 U/L (ref 11–51)

## 2021-01-21 LAB — ETHANOL: Alcohol, Ethyl (B): <10 mg/dL (ref <10)

## 2021-01-21 LAB — MAGNESIUM: Magnesium: 2 mg/dL (ref 1.7–2.4)

## 2021-01-21 LAB — HEMOGLOBIN A1C
Hgb A1c MFr Bld: 5.1 % (ref 4.8–5.6)
Mean Plasma Glucose: 99.67 mg/dL

## 2021-01-21 MED ORDER — ACETAMINOPHEN 325 MG PO TABS: 650.0000 mg | ORAL_TABLET | Freq: Four times a day (QID) | ORAL | Status: DC | PRN

## 2021-01-21 MED ORDER — BISACODYL 5 MG PO TBEC: 5.0000 mg | DELAYED_RELEASE_TABLET | Freq: Every day | ORAL | Status: DC | PRN

## 2021-01-21 MED ORDER — HYDRALAZINE HCL 50 MG PO TABS
75.0000 mg | ORAL_TABLET | Freq: Three times a day (TID) | ORAL | Status: DC
  Administered 2021-01-22 – 2021-01-23 (×4): 75 mg via ORAL
  Filled 2021-01-21 (×4): qty 1

## 2021-01-21 MED ORDER — ONDANSETRON HCL 4 MG PO TABS: 4.0000 mg | ORAL_TABLET | Freq: Four times a day (QID) | ORAL | Status: DC | PRN

## 2021-01-21 MED ORDER — LEVETIRACETAM IN NACL 1500 MG/100ML IV SOLN
1500.0000 mg | Freq: Once | INTRAVENOUS | Status: AC
  Administered 2021-01-21: 1500 mg via INTRAVENOUS
  Filled 2021-01-21: qty 100

## 2021-01-21 MED ORDER — HYDRALAZINE HCL 20 MG/ML IJ SOLN
5.0000 mg | INTRAMUSCULAR | Status: DC | PRN
  Administered 2021-01-21 – 2021-01-22 (×3): 5 mg via INTRAVENOUS
  Filled 2021-01-21 (×4): qty 1

## 2021-01-21 MED ORDER — CALCITRIOL 1 MCG/ML IV SOLN
1.7500 ug | INTRAVENOUS | Status: DC
Start: 2021-01-24 — End: 2021-01-21

## 2021-01-21 MED ORDER — SODIUM CHLORIDE 0.9 % IV SOLN
75.0000 mL/h | INTRAVENOUS | Status: DC
  Administered 2021-01-21 – 2021-01-23 (×4): 75 mL/h via INTRAVENOUS

## 2021-01-21 MED ORDER — ATORVASTATIN CALCIUM 80 MG PO TABS
80.0000 mg | ORAL_TABLET | Freq: Every day | ORAL | Status: DC
  Administered 2021-01-22: 80 mg via ORAL
  Filled 2021-01-21: qty 1

## 2021-01-21 MED ORDER — OXYCODONE HCL ER 10 MG PO T12A
10.0000 mg | EXTENDED_RELEASE_TABLET | Freq: Two times a day (BID) | ORAL | Status: DC
  Administered 2021-01-22 – 2021-01-23 (×3): 10 mg via ORAL
  Filled 2021-01-21 (×3): qty 1

## 2021-01-21 MED ORDER — INSULIN ASPART 100 UNIT/ML ~~LOC~~ SOLN: 0.0000 [IU] | Freq: Every day | SUBCUTANEOUS | Status: DC

## 2021-01-21 MED ORDER — HYDRALAZINE HCL 20 MG/ML IJ SOLN
5.0000 mg | Freq: Once | INTRAMUSCULAR | Status: AC
  Administered 2021-01-21: 5 mg via INTRAVENOUS
  Filled 2021-01-21: qty 1

## 2021-01-21 MED ORDER — MORPHINE SULFATE (PF) 2 MG/ML IV SOLN
2.0000 mg | INTRAVENOUS | Status: DC | PRN
  Administered 2021-01-21 – 2021-01-22 (×3): 2 mg via INTRAVENOUS
  Filled 2021-01-21 (×3): qty 1

## 2021-01-21 MED ORDER — AMLODIPINE BESYLATE 10 MG PO TABS
10.0000 mg | ORAL_TABLET | Freq: Every day | ORAL | Status: DC
  Administered 2021-01-22 – 2021-01-23 (×2): 10 mg via ORAL
  Filled 2021-01-21 (×2): qty 1

## 2021-01-21 MED ORDER — DOXERCALCIFEROL 4 MCG/2ML IV SOLN: 1.8000 ug | INTRAVENOUS | Status: DC

## 2021-01-21 MED ORDER — SEVELAMER CARBONATE 800 MG PO TABS
3200.0000 mg | ORAL_TABLET | Freq: Three times a day (TID) | ORAL | Status: DC
  Administered 2021-01-22 – 2021-01-23 (×4): 3200 mg via ORAL
  Filled 2021-01-21 (×5): qty 4

## 2021-01-21 MED ORDER — FAMOTIDINE 20 MG PO TABS
20.0000 mg | ORAL_TABLET | Freq: Every day | ORAL | Status: DC
  Administered 2021-01-22: 20 mg via ORAL
  Filled 2021-01-21: qty 1

## 2021-01-21 MED ORDER — LORAZEPAM 2 MG/ML IJ SOLN: 1.0000 mg | Freq: Once | INTRAMUSCULAR | Status: AC

## 2021-01-21 MED ORDER — DEXTROSE 50 % IV SOLN
1.0000 | Freq: Once | INTRAVENOUS | Status: AC
  Administered 2021-01-22: 50 mL via INTRAVENOUS
  Filled 2021-01-21: qty 50

## 2021-01-21 MED ORDER — MELATONIN 3 MG PO TABS
3.0000 mg | ORAL_TABLET | Freq: Every day | ORAL | Status: DC
  Administered 2021-01-22: 3 mg via ORAL
  Filled 2021-01-21: qty 1

## 2021-01-21 MED ORDER — PREGABALIN 100 MG PO CAPS
100.0000 mg | ORAL_CAPSULE | Freq: Every day | ORAL | Status: DC
Start: 2021-01-21 — End: 2021-01-23
  Administered 2021-01-22: 100 mg via ORAL
  Filled 2021-01-21: qty 1

## 2021-01-21 MED ORDER — HEPARIN SODIUM (PORCINE) 5000 UNIT/ML IJ SOLN
5000.0000 [IU] | Freq: Three times a day (TID) | INTRAMUSCULAR | Status: DC
  Administered 2021-01-21 – 2021-01-23 (×6): 5000 [IU] via SUBCUTANEOUS
  Filled 2021-01-21 (×6): qty 1

## 2021-01-21 MED ORDER — DULOXETINE HCL 30 MG PO CPEP
30.0000 mg | ORAL_CAPSULE | Freq: Every day | ORAL | Status: DC
  Administered 2021-01-22 – 2021-01-23 (×2): 30 mg via ORAL
  Filled 2021-01-21 (×2): qty 1

## 2021-01-21 MED ORDER — INSULIN ASPART 100 UNIT/ML ~~LOC~~ SOLN: 0.0000 [IU] | Freq: Three times a day (TID) | SUBCUTANEOUS | Status: DC

## 2021-01-21 MED ORDER — LORAZEPAM 2 MG/ML IJ SOLN
INTRAMUSCULAR | Status: AC
  Administered 2021-01-21: 1 mg via INTRAMUSCULAR
  Filled 2021-01-21: qty 1

## 2021-01-21 MED ORDER — CALCIUM CARBONATE ANTACID 1250 MG/5ML PO SUSP
500.0000 mg | Freq: Four times a day (QID) | ORAL | Status: DC | PRN
  Filled 2021-01-21: qty 5

## 2021-01-21 MED ORDER — POLYETHYLENE GLYCOL 3350 17 G PO PACK
17.0000 g | PACK | Freq: Every day | ORAL | Status: DC | PRN
Start: 2021-01-21 — End: 2021-01-23

## 2021-01-21 MED ORDER — CARVEDILOL 12.5 MG PO TABS: 12.5000 mg | ORAL_TABLET | ORAL | Status: DC

## 2021-01-21 MED ORDER — LEVETIRACETAM IN NACL 500 MG/100ML IV SOLN
500.0000 mg | Freq: Two times a day (BID) | INTRAVENOUS | Status: DC
  Administered 2021-01-22: 500 mg via INTRAVENOUS
  Filled 2021-01-21 (×2): qty 100

## 2021-01-21 MED ORDER — ONDANSETRON HCL 4 MG/2ML IJ SOLN: 4.0000 mg | Freq: Four times a day (QID) | INTRAMUSCULAR | Status: DC | PRN

## 2021-01-21 MED ORDER — DARBEPOETIN ALFA 200 MCG/0.4ML IJ SOSY: 200.0000 ug | PREFILLED_SYRINGE | INTRAMUSCULAR | Status: DC
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
## 2021-01-21 NOTE — ED Triage Notes (Addendum)
Pt was at dialysis and had a witnessed grand mal seizure that lasted 1 min. Pt has been post tictal for EMS ever since. Per EMS dialysis ctr checked his bs and it was 71 so they gave him amp of D50. Per EMS pt missed last 2 dialysis days prior today. PT finished all of tx, but the last 13 mins. Pt alert, but unresponsive.

## 2021-01-21 NOTE — Progress Notes (Signed)
[name] Kitchen ([id number]) , Visit Report for 01/18/2021 Allergy List Details Patient Name: Date of Service: [name], [name] 01/18/2021 2:45 PM Medical Record Number: [id number] Patient Account Number: [id number] Date of Birth/Sex: Treating RN: 06-17-1961 (59 y.o. Male) [name] Primary Care [name]: [name] Other Clinician: Referring [name]: Treating [name]/Extender: [name] in Treatment: 0 Allergies Active Allergies No Known Allergies Type: Allergen Allergy Notes Electronic Signature(s) Signed: 01/18/2021 5:47:53 PM By: [name] Gouty RN, BSN Entered By: [name] on 01/18/2021 14:47:08 -------------------------------------------------------------------------------- Arrival Information Details Patient Name: Date of Service: [name]. 01/18/2021 2:45 PM Medical Record Number: [id number] Patient Account Number: [id number] Date of Birth/Sex: Treating RN: 07-[name] (59 y.o. Male) [name] Primary Care Ngan Qualls: [name] Other Clinician: Referring Colson Barco: Treating [name]/Extender: [name] in Treatment: 0 Visit Information Patient Arrived: Wheel Chair Arrival Time: 14:34 Accompanied By: self Transfer Assistance: None Patient Identification Verified: Yes Secondary Verification Process Completed: Yes Patient Requires Transmission-Based Precautions: No Patient Has Alerts: No History Since Last Visit Notes stayed in wheelchair Electronic Signature(s) Signed: 01/18/2021 5:47:53 PM By: [name] Gouty RN, BSN Entered By: [name] on 01/18/2021 14:44:20 -------------------------------------------------------------------------------- Clinic Level of Care Assessment Details Patient Name: Date of Service: [name] 01/18/2021 2:45 PM Medical Record Number: [id number] Patient Account Number: [id number] Date of Birth/Sex: Treating RN: 03-Apr-1961 (59  y.o. Male) [name] Primary Care [name]: [name] Other Clinician: Referring [name]: Treating [name]/Extender: [name] in Treatment: 0 Clinic Level of Care Assessment Items TOOL 2 Quantity Score X- 1 0 Use when only an EandM is performed on the INITIAL visit ASSESSMENTS - Nursing Assessment / Reassessment X- 1 20 General Physical Exam (combine w/ comprehensive assessment (listed just below) when performed on new pt. evals) X- 1 25 Comprehensive Assessment (HX, ROS, Risk Assessments, Wounds Hx, etc.) ASSESSMENTS - Wound and Skin A ssessment / Reassessment '[]'$  - 0 Simple Wound Assessment / Reassessment - one wound X- 2 5 Complex Wound Assessment / Reassessment - multiple wounds X- 1 10 Dermatologic / Skin Assessment (not related to wound area) ASSESSMENTS - Ostomy and/or Continence Assessment and Care '[]'$  - 0 Incontinence Assessment and Management '[]'$  - 0 Ostomy Care Assessment and Management (repouching, etc.) PROCESS - Coordination of Care '[]'$  - 0 Simple Patient / Family Education for ongoing care X- 1 20 Complex (extensive) Patient / Family Education for ongoing care X- 1 10 Staff obtains Programmer, systems, Records, T Results / Process Orders est '[]'$  - 0 Staff telephones HHA, Nursing Homes / Clarify orders / etc '[]'$  - 0 Routine Transfer to another Facility (non-emergent condition) '[]'$  - 0 Routine Hospital Admission (non-emergent condition) X- 1 15 New Admissions / Biomedical engineer / Ordering NPWT Apligraf, etc. , '[]'$  - 0 Emergency Hospital Admission (emergent condition) X- 1 10 Simple Discharge Coordination '[]'$  - 0 Complex (extensive) Discharge Coordination PROCESS - Special Needs '[]'$  - 0 Pediatric / Minor Patient Management '[]'$  - 0 Isolation Patient Management '[]'$  - 0 Hearing / Language / Visual special needs '[]'$  - 0 Assessment of Community assistance (transportation, D/C planning, etc.) '[]'$  - 0 Additional assistance /  Altered mentation '[]'$  - 0 Support Surface(s) Assessment (bed, cushion, seat, etc.) INTERVENTIONS - Wound Cleansing / Measurement '[]'$  - 0 Wound Imaging (photographs - any number of wounds) '[]'$  - 0 Wound Tracing (instead of photographs) '[]'$  - 0 Simple Wound Measurement - one wound X- 2 5 Complex Wound Measurement - multiple wounds '[]'$  -  0 Simple Wound Cleansing - one wound X- 2 5 Complex Wound Cleansing - multiple wounds INTERVENTIONS - Wound Dressings '[]'$  - 0 Small Wound Dressing one or multiple wounds X- 2 15 Medium Wound Dressing one or multiple wounds '[]'$  - 0 Large Wound Dressing one or multiple wounds '[]'$  - 0 Application of Medications - injection INTERVENTIONS - Miscellaneous '[]'$  - 0 External ear exam '[]'$  - 0 Specimen Collection (cultures, biopsies, blood, body fluids, etc.) '[]'$  - 0 Specimen(s) / Culture(s) sent or taken to Lab for analysis '[]'$  - 0 Patient Transfer (multiple staff / [name] / Similar devices) '[]'$  - 0 Simple Staple / Suture removal (25 or less) '[]'$  - 0 Complex Staple / Suture removal (26 or more) '[]'$  - 0 Hypo / Hyperglycemic Management (close monitor of Blood Glucose) '[]'$  - 0 Ankle / Brachial Index (ABI) - do not check if billed separately Has the patient been seen at the hospital within the last three years: Yes Total Score: 170 Level Of Care: New/Established - Level 5 Electronic Signature(s) Signed: 01/21/2021 5:09:47 PM By: [name] Hammock RN Entered By: [name] on 01/18/2021 15:33:13 -------------------------------------------------------------------------------- Encounter Discharge Information Details Patient Name: Date of Service: [name] Cane A. 01/18/2021 2:45 PM Medical Record Number: [id number] Patient Account Number: [id number] Date of Birth/Sex: Treating RN: Sep 27, 1961 (59 y.o. Male) [name] Primary Care Rianne Degraaf: [name] Other Clinician: Referring Shalamar Crays: Treating Modelle Vollmer/Extender: [name] in Treatment: 0 Encounter Discharge Information Items Post Procedure Vitals Discharge Condition: Stable Temperature (F): 98.2 Ambulatory Status: Wheelchair Pulse (bpm): 77 Discharge Destination: Home Respiratory Rate (breaths/min): 18 Transportation: Private Auto Blood Pressure (mmHg): 211/99 Accompanied By: family member Schedule Follow-up Appointment: Yes Clinical Summary of Care: Patient Declined Electronic Signature(s) Signed: 01/18/2021 5:45:50 PM By: [name] RN, BSN Entered By: [name] on 01/18/2021 17:33:38 -------------------------------------------------------------------------------- Lower Extremity Assessment Details Patient Name: Date of Service: [name] Cane A. 01/18/2021 2:45 PM Medical Record Number: [id number] Patient Account Number: [id number] Date of Birth/Sex: Treating RN: 14-Dec-1960 (59 y.o. Male) [name] Primary Care Alizee Maple: [name] Other Clinician: Referring Colbey Wirtanen: Treating Kayston Jodoin/Extender: [name] in Treatment: 0 Electronic Signature(s) Signed: 01/18/2021 5:47:53 PM By: [name] Gouty RN, BSN Entered By: [name] on 01/18/2021 14:58:54 -------------------------------------------------------------------------------- Multi Wound Chart Details Patient Name: Date of Service: [name] Cane A. 01/18/2021 2:45 PM Medical Record Number: [id number] Patient Account Number: [id number] Date of Birth/Sex: Treating RN: 01-Dec-1961 (59 y.o. Male) [name] Primary Care Ferrah Panagopoulos: [name] Other Clinician: Referring Donia Yokum: Treating Liliah Dorian/Extender: [name] in Treatment: 0 Vital Signs Height(in): 64 Capillary Blood Glucose(mg/dl): 110 Weight(lbs): 191 Pulse(bpm): 24 Body Mass Index(BMI): 23 Blood Pressure(mmHg): 211/99 Temperature(F): 98.2 Respiratory Rate(breaths/min): 18 Photos: [12:No Photos Right Knee] [13:No  Photos Right Amputation Site - Below Knee] [14:No Photos Right, Proximal, Anterior Lower Leg] Wound Location: [12:Pressure Injury] [13:Pressure Injury] [14:Blister] Wounding Event: [12:Diabetic Wound/Ulcer of the Lower] [13:Diabetic Wound/Ulcer of the Lower] [14:Arterial Insufficiency Ulcer] Primary Etiology: [12:Extremity Anemia, Hypertension, Peripheral] [13:Extremity Anemia, Hypertension, Peripheral] [14:Anemia, Hypertension, Peripheral] Comorbid History: [12:Arterial Disease, Hepatitis C, Type II Diabetes, End Stage Renal Disease, Osteoarthritis, Osteomyelitis, Neuropathy 09/03/2020] [13:Arterial Disease, Hepatitis C, Type II Diabetes, End Stage Renal Disease, Osteoarthritis,  Osteomyelitis, Neuropathy 09/03/2020] [14:Arterial Disease, Hepatitis C, Type II Diabetes, End Stage Renal Disease, Osteoarthritis, Osteomyelitis, Neuropathy 09/04/2020] Date Acquired: [12:0] [13:0] [14:0] Weeks of Treatment: [12:Open] [13:Open] [14:Open] Wound Status: [12:2.3x2.3x0.6] [13:0.7x1.7x0.1] [14:0.8x0.9x0.2] Measurements L x W x D (cm) [12:4.155] [13:0.935] [14:0.565] A (cm) :  rea [12:2.493] [13:0.093] [14:0.113] Volume (cm) : [12:11] Starting Position 1 (o'clock): [12:4] Ending Position 1 (o'clock): [12:1.5] Maximum Distance 1 (cm): [12:Yes] [13:No] [14:No] Undermining: [12:Grade 2] [13:Grade 1] [14:Full Thickness Without Exposed] Classification: [12:Medium] [13:None Present] [14:Support Structures Medium] Exudate A mount: [12:Serosanguineous] [13:N/A] [14:Serosanguineous] Exudate Type: [12:red, brown] [13:N/A] [14:red, brown] Exudate Color: [12:Epibole] [13:Flat and Intact] [14:Distinct, outline attached] Wound Margin: [12:Small (1-33%)] [13:None Present (0%)] [14:Medium (34-66%)] Granulation A mount: [12:Red] [13:N/A] [14:Red, Pink] Granulation Quality: [12:Large (67-100%)] [13:None Present (0%)] [14:Medium (34-66%)] Necrotic A mount: [12:Adherent Slough] [13:N/A] [14:Eschar, Adherent Slough] Necrotic  Tissue: [12:Fat Layer (Subcutaneous Tissue): Yes Fat Layer (Subcutaneous Tissue): Yes Fascia: No] Exposed Structures: [12:Muscle: Yes Fascia: No Tendon: No Joint: No Bone: No None] [13:Fascia: No Tendon: No Muscle: No Joint: No Bone: No None] [14:Fat Layer (Subcutaneous Tissue): No Tendon: No Muscle: No Joint: No Bone: No None] Epithelialization: [12:N/A] [13:N/A] [14:Debridement - Selective/Open Wound] Debridement: Pre-procedure Verification/Time Out N/A [13:N/A] [14:15:40] Taken: [12:N/A] [13:N/A] [14:Lidocaine] Pain Control: [12:N/A] [13:N/A] [14:Necrotic/Eschar, Slough] Tissue Debrided: [12:N/A] [13:N/A] [14:Skin/Epidermis] Level: [12:N/A] [13:N/A] [14:0.72] Debridement A (sq cm): [12:rea N/A] [13:N/A] [14:Curette] Instrument: [12:N/A] [13:N/A] [14:Minimum] Bleeding: [12:N/A] [13:N/A] [14:Pressure] Hemostasis Achieved: [12:N/A] [13:N/A] [14:0] Procedural Pain: [12:N/A] [13:N/A] [14:0] Post Procedural Pain: [12:N/A] [13:N/A] [14:Procedure was tolerated well] Debridement Treatment Response: [12:N/A] [13:N/A] [14:0.8x0.9x0.2] Post Debridement Measurements L x W x D (cm) [12:N/A] [13:N/A] [14:0.113] Post Debridement Volume: (cm) [12:N/A] [13:dry wound bed] [14:N/A] Assessment Notes: [12:N/A] [13:N/A] [14:Debridement] Wound Number: 15 N/A N/A Photos: No Photos N/A N/A Right, Anterior Lower Leg N/A N/A Wound Location: Blister N/A N/A Wounding Event: Arterial Insufficiency Ulcer N/A N/A Primary Etiology: Anemia, Hypertension, Peripheral N/A N/A Comorbid History: Arterial Disease, Hepatitis C, Type II Diabetes, End Stage Renal Disease, Osteoarthritis, Osteomyelitis, Neuropathy 10/07/2020 N/A N/A Date Acquired: 0 N/A N/A Weeks of Treatment: Open N/A N/A Wound Status: 1x0.8x0.2 N/A N/A Measurements L x W x D (cm) 0.628 N/A N/A A (cm) : rea 0.126 N/A N/A Volume (cm) : No N/A N/A Undermining: Full Thickness Without Exposed N/A N/A Classification: Support  Structures Medium N/A N/A Exudate A mount: Serosanguineous N/A N/A Exudate Type: red, brown N/A N/A Exudate Color: Distinct, outline attached N/A N/A Wound Margin: Medium (34-66%) N/A N/A Granulation A mount: Red, Pink N/A N/A Granulation Quality: Medium (34-66%) N/A N/A Necrotic A mount: Eschar, Adherent Slough N/A N/A Necrotic Tissue: Fascia: No N/A N/A Exposed Structures: Fat Layer (Subcutaneous Tissue): No Tendon: No Muscle: No Joint: No Bone: No None N/A N/A Epithelialization: Debridement - Selective/Open Wound N/A N/A Debridement: Pre-procedure Verification/Time Out 15:44 N/A N/A Taken: Lidocaine N/A N/A Pain Control: Necrotic/Eschar, Slough N/A N/A Tissue Debrided: Skin/Epidermis N/A N/A Level: 0.8 N/A N/A Debridement A (sq cm): rea Curette N/A N/A Instrument: Minimum N/A N/A Bleeding: Pressure N/A N/A Hemostasis A chieved: 0 N/A N/A Procedural Pain: 0 N/A N/A Post Procedural Pain: Procedure was tolerated well N/A N/A Debridement Treatment Response: 1x0.8x0.2 N/A N/A Post Debridement Measurements L x W x D (cm) 0.126 N/A N/A Post Debridement Volume: (cm) N/A N/A N/A Assessment Notes: Debridement N/A N/A Procedures Performed: Treatment Notes Electronic Signature(s) Signed: 01/18/2021 5:22:09 PM By: Linton Ham MD Signed: 01/21/2021 5:09:47 PM By: [name] Hammock RN Entered By: Linton Ham on 01/18/2021 16:12:09 -------------------------------------------------------------------------------- Multi-Disciplinary Care Plan Details Patient Name: Date of Service: [name] Cane A. 01/18/2021 2:45 PM Medical Record Number: [id number] Patient Account Number: [id number] Date of Birth/Sex: Treating RN: 06-25-1961 (59 y.o. Male) [name] Primary Care Woodie Degraffenreid: [name] Other Clinician: Referring Brinae Woods:  Treating Lashunta Frieden/Extender: [name] in Treatment: 0 Active Inactive Orientation to  the Wound Care Program Nursing Diagnoses: Knowledge deficit related to the wound healing center program Goals: Patient/caregiver will verbalize understanding of the [place] Program Date Initiated: 01/18/2021 Target Resolution Date: 02/11/2021 Goal Status: Active Interventions: Provide education on orientation to the wound center Notes: Wound/Skin Impairment Nursing Diagnoses: Impaired tissue integrity Knowledge deficit related to ulceration/compromised skin integrity Goals: Patient will have a decrease in wound volume by X% from date: (specify in notes) Date Initiated: 01/18/2021 Target Resolution Date: 02/11/2021 Goal Status: Active Patient/caregiver will verbalize understanding of skin care regimen Date Initiated: 01/18/2021 Target Resolution Date: 02/11/2021 Goal Status: Active Ulcer/skin breakdown will have a volume reduction of 30% by week 4 Date Initiated: 01/18/2021 Target Resolution Date: 02/11/2021 Goal Status: Active Interventions: Assess patient/caregiver ability to obtain necessary supplies Assess patient/caregiver ability to perform ulcer/skin care regimen upon admission and as needed Assess ulceration(s) every visit Notes: Electronic Signature(s) Signed: 01/21/2021 5:09:47 PM By: [name] Hammock RN Entered By: [name] on 01/18/2021 15:31:45 -------------------------------------------------------------------------------- Pain Assessment Details Patient Name: Date of Service: [name] Cane A. 01/18/2021 2:45 PM Medical Record Number: [id number] Patient Account Number: [id number] Date of Birth/Sex: Treating RN: May 27, 1961 (59 y.o. Male) [name] Primary Care Cosmo Tetreault: [name] Other Clinician: Referring Jillianne Gamino: Treating Jaidon Ellery/Extender: [name] in Treatment: 0 Active Problems Location of Pain Severity and Description of Pain Patient Has Paino Yes Site Locations Pain Location: Pain in  Ulcers With Dressing Change: Yes Duration of the Pain. Constant / Intermittento Constant Rate the pain. Current Pain Level: 7 Worst Pain Level: 8 Least Pain Level: 6 Character of Pain Describe the Pain: Burning, Throbbing Pain Management and Medication Current Pain Management: Medication: Yes Is the Current Pain Management Adequate: Adequate How does your wound impact your activities of daily livingo Sleep: Yes Bathing: No Appetite: No Relationship With Others: No Bladder Continence: No Emotions: Yes Bowel Continence: No Work: No Toileting: No Drive: No Dressing: No Hobbies: No Electronic Signature(s) Signed: 01/18/2021 5:47:53 PM By: [name] Gouty RN, BSN Entered By: [name] on 01/18/2021 15:13:28 -------------------------------------------------------------------------------- Patient/Caregiver Education Details Patient Name: Date of Service: [name] 2/15/2022andnbsp2:45 PM Medical Record Number: [id number] Patient Account Number: [id number] Date of Birth/Gender: Treating RN: 02-28-1961 (59 y.o. Male) [name] Primary Care Physician: [name] Other Clinician: Referring Physician: Treating Physician/Extender: [name] in Treatment: 0 Education Assessment Education Provided To: Patient Education Topics Provided Welcome T [facility]: o Methods: Explain/Verbal Responses: State content correctly Electronic Signature(s) Signed: 01/21/2021 5:09:47 PM By: [name] Hammock RN Entered By: [name] on 01/18/2021 15:31:56 -------------------------------------------------------------------------------- Wound Assessment Details Patient Name: Date of Service: [name] Cane A. 01/18/2021 2:45 PM Medical Record Number: [id number] Patient Account Number: [id number] Date of Birth/Sex: Treating RN: 27-Apr-1961 (59 y.o. Male) [name] Primary Care Joaquin Knebel: [name] Other  Clinician: Referring Tabias Swayze: Treating Aarib Pulido/Extender: [name] in Treatment: 0 Wound Status Wound Number: 12 Primary Diabetic Wound/Ulcer of the Lower Extremity Etiology: Wound Location: Right Knee Wound Open Wounding Event: Pressure Injury Status: Date Acquired: 09/03/2020 Comorbid Anemia, Hypertension, Peripheral Arterial Disease, Hepatitis C, Weeks Of Treatment: 0 History: Type II Diabetes, End Stage Renal Disease, Osteoarthritis, Clustered Wound: No Osteomyelitis, Neuropathy Photos Photo Uploaded By: Mikeal Hawthorne on 01/20/2021 09:02:34 Wound Measurements Length: (cm) 2.3 % Re Width: (cm) 2.3 % Re Depth: (cm) 0.6 Epit Area: (cm) 4.155 Tun Volume: (cm) 2.493 Und  S E M duction in Area: duction in Volume: helialization: None neling: No ermining: Yes tarting Position (o'clock): 11 nding Position (o'clock): 4 aximum Distance: (cm) 1.5 Wound Description Classification: Grade 2 Foul Wound Margin: Epibole Slou Exudate Amount: Medium Exudate Type: Serosanguineous Exudate Color: red, brown Odor After Cleansing: No gh/Fibrino Yes Wound Bed Granulation Amount: Small (1-33%) Exposed Structure Granulation Quality: Red Fascia Exposed: No Necrotic Amount: Large (67-100%) Fat Layer (Subcutaneous Tissue) Exposed: Yes Necrotic Quality: Adherent Slough Tendon Exposed: No Muscle Exposed: Yes Necrosis of Muscle: No Joint Exposed: No Bone Exposed: No Treatment Notes Wound #12 (Knee) Wound Laterality: Right Cleanser Soap and Water Discharge Instruction: May shower and wash wound with dial antibacterial soap and water prior to dressing change. Wound Cleanser Discharge Instruction: Cleanse the wound with wound cleanser prior to applying a clean dressing using gauze sponges, not tissue or cotton balls. Peri-Wound Care Topical Primary Dressing Promogran Prisma Matrix, 4.34 (sq in) (silver collagen) Discharge Instruction: Moisten collagen  with saline or hydrogel Secondary Dressing Woven Gauze Sponge, Non-Sterile 4x4 in Discharge Instruction: Apply over primary dressing as directed. ABD Pad, 5x9 Discharge Instruction: Apply over primary dressing as directed. Secured With [employer], 4.5x3.1 (in/yd) Discharge Instruction: Secure with Kerlix as directed. 342M Medipore H Soft Cloth Surgical T ape, 2x2 (in/yd) Discharge Instruction: Secure dressing with tape as directed. Compression Wrap Compression Stockings Add-Ons Electronic Signature(s) Signed: 01/18/2021 5:47:53 PM By: [name] Gouty RN, BSN Entered By: [name] on 01/18/2021 15:11:04 -------------------------------------------------------------------------------- Wound Assessment Details Patient Name: Date of Service: [name]. 01/18/2021 2:45 PM Medical Record Number: [id number] Patient Account Number: [id number] Date of Birth/Sex: Treating RN: September 18, 1961 (59 y.o. 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 Wound Open Wounding Event: Pressure Injury Status: Date Acquired: 09/03/2020 Comorbid Anemia, Hypertension, Peripheral Arterial Disease, Hepatitis C, Weeks Of Treatment: 0 History: Type II Diabetes, End Stage Renal Disease, Osteoarthritis, Clustered Wound: No Osteomyelitis, Neuropathy Photos Photo Uploaded By: Mikeal Hawthorne on 01/20/2021 09:02:34 Wound Measurements Length: (cm) 0.7 Width: (cm) 1.7 Depth: (cm) 0.1 Area: (cm) 0.935 Volume: (cm) 0.093 % Reduction in Area: % Reduction in Volume: Epithelialization: None Tunneling: No Undermining: No Wound Description Classification: Grade 1 Wound Margin: Flat and Intact Exudate Amount: None  Present Foul Odor After Cleansing: No Slough/Fibrino No Wound Bed Granulation Amount: None Present (0%) Exposed Structure Necrotic Amount: None Present (0%) Fascia Exposed: No Fat Layer (Subcutaneous Tissue) Exposed: Yes Tendon Exposed: No Muscle Exposed: No Joint Exposed: No Bone Exposed: No Assessment Notes dry wound bed Treatment Notes Wound #13 (Amputation Site - Below Knee) Wound Laterality: Right Cleanser Soap and Water Discharge Instruction: May shower and wash wound with dial antibacterial soap and water prior to dressing change. Wound Cleanser Discharge Instruction: Cleanse the wound with wound cleanser prior to applying a clean dressing using gauze sponges, not tissue or cotton balls. Peri-Wound Care Topical Primary Dressing Promogran Prisma Matrix, 4.34 (sq in) (silver collagen) Discharge Instruction: Moisten collagen with saline or hydrogel Secondary Dressing Woven Gauze Sponge, Non-Sterile 4x4 in Discharge Instruction: Apply over primary dressing as directed. ABD Pad, 5x9 Discharge Instruction: Apply over primary dressing as directed. Secured With [employer], 4.5x3.1 (in/yd) Discharge Instruction: Secure with Kerlix as directed. 342M Medipore H Soft Cloth Surgical T ape, 2x2 (in/yd) Discharge Instruction: Secure dressing  with tape as directed. Compression Wrap Compression Stockings Add-Ons Electronic Signature(s) Signed: 01/18/2021 5:47:53 PM By: [name] Gouty RN, BSN Entered By: [name] on 01/18/2021 15:12:18 -------------------------------------------------------------------------------- Wound Assessment Details Patient Name: Date of Service: [name]. 01/18/2021 2:45 PM Medical Record Number: [id number] Patient Account Number: [id number] Date of Birth/Sex: Treating RN: [birth year] (59 y.o. Male) [name] Primary Care Vardaan Depascale: [name] Other Clinician: Referring Aidian Salomon: Treating Mallery Harshman/Extender: [name] in Treatment: 0 Wound Status Wound Number: 14 Primary Arterial Insufficiency Ulcer Etiology: Wound Location: Right, Proximal, Anterior Lower Leg Wound Open Wounding Event: Blister Status: Date Acquired: 09/04/2020 Comorbid Anemia, Hypertension, Peripheral Arterial Disease, Hepatitis C, Weeks Of Treatment: 0 History: Type II Diabetes, End Stage Renal Disease, Osteoarthritis, Clustered Wound: No Osteomyelitis, Neuropathy Photos Photo Uploaded By: Mikeal Hawthorne on 01/20/2021 13:42:53 Wound Measurements Length: (cm) 0.8 Width: (cm) 0.9 Depth: (cm) 0.2 Area: (cm) 0.565 Volume: (cm) 0.113 % Reduction in Area: % Reduction in Volume: Epithelialization: None Tunneling: No Undermining: No Wound Description Classification: Full Thickness Without Exposed Support Structures Wound Margin: Distinct, outline attached Exudate Amount: Medium Exudate Type: Serosanguineous Exudate Color: red, brown Foul Odor After Cleansing: No Slough/Fibrino Yes Wound Bed Granulation Amount: Medium (34-66%) Exposed Structure Granulation Quality: Red, Pink Fascia Exposed: No Necrotic Amount: Medium (34-66%) Fat Layer (Subcutaneous Tissue) Exposed: No Necrotic Quality: Eschar, Adherent Slough Tendon Exposed: No Muscle Exposed: No Joint Exposed: No Bone Exposed: No Treatment Notes Wound #14 (Lower Leg) Wound Laterality: Right, Anterior, Proximal Cleanser Soap and Water Discharge Instruction: May shower and wash wound with dial antibacterial soap and water prior to dressing change. Wound Cleanser Discharge Instruction: Cleanse the wound with wound cleanser prior to applying a clean dressing using gauze sponges, not tissue or cotton balls. Peri-Wound Care Topical Primary Dressing Promogran Prisma Matrix, 4.34 (sq in) (silver collagen) Discharge Instruction: Moisten collagen with saline or hydrogel Secondary Dressing Woven Gauze Sponge, Non-Sterile 4x4 in Discharge  Instruction: Apply over primary dressing as directed. ABD Pad, 5x9 Discharge Instruction: Apply over primary dressing as directed. Secured With [employer], 4.5x3.1 (in/yd) Discharge Instruction: Secure with Kerlix as directed. 24M Medipore H Soft Cloth Surgical T ape, 2x2 (in/yd) Discharge Instruction: Secure dressing with tape as directed. Compression Wrap Compression Stockings Add-Ons Electronic Signature(s) Signed: 01/21/2021 5:09:47 PM By: [name] Hammock RN Entered By: [name] on 01/18/2021 15:41:06 -------------------------------------------------------------------------------- Wound Assessment Details Patient Name: Date of Service: [name]. 01/18/2021 2:45 PM Medical Record Number: [id number] Patient Account Number: [id number] Date of Birth/Sex: Treating RN: August 11, 1961 (59 y.o. 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: Wound Location: Right, Anterior Lower Leg Wound Open Wounding Event: Blister Status: Date Acquired: 10/07/2020 Comorbid Anemia, Hypertension, Peripheral Arterial Disease, Hepatitis C, Weeks Of Treatment: 0 History: Type II Diabetes, End Stage Renal Disease, Osteoarthritis, Clustered Wound: No Osteomyelitis, Neuropathy Photos Photo Uploaded By: Mikeal Hawthorne on 01/20/2021 13:43:12 Wound Measurements Length: (cm) 1 Width: (cm) 0.8 Depth: (cm) 0.2 Area: (cm) 0.628 Volume: (cm) 0.126 % Reduction in Area: % Reduction in Volume: Epithelialization: None Tunneling: No Undermining: No Wound Description Classification: Full Thickness Without Exposed Support Structu Wound Margin: Distinct, outline attached Exudate Amount: Medium Exudate Type: Serosanguineous Exudate Color: red, brown Wound  Bed Granulation Amount: Medium (34-66%) Granulation Quality: Red, Pink Necrotic Amount: Medium (34-66%) Necrotic Quality: Eschar, Adherent Slough res [place] Odor After Cleansing:  No Slough/Fibrino Yes Exposed Structure Fascia Exposed: No Fat Layer (Subcutaneous Tissue) Exposed: No Tendon Exposed: No Muscle Exposed: No Joint Exposed: No Bone Exposed: No Treatment Notes Wound #15 (Lower Leg) Wound Laterality: Right, Anterior Cleanser Soap and Water Discharge Instruction: May shower and wash wound with dial antibacterial soap and water prior to dressing change. Wound Cleanser Discharge Instruction: Cleanse the wound with wound cleanser prior to applying a clean dressing using gauze sponges, not tissue or cotton balls. Peri-Wound Care Topical Primary Dressing Promogran Prisma Matrix, 4.34 (sq in) (silver collagen) Discharge Instruction: Moisten collagen with saline or hydrogel Secondary Dressing Woven Gauze Sponge, Non-Sterile 4x4 in Discharge Instruction: Apply over primary dressing as directed. ABD Pad, 5x9 Discharge Instruction: Apply over primary dressing as directed. Secured With [employer], 4.5x3.1 (in/yd) Discharge Instruction: Secure with Kerlix as directed. 67M Medipore H Soft Cloth Surgical T ape, 2x2 (in/yd) Discharge Instruction: Secure dressing with tape as directed. Compression Wrap Compression Stockings Add-Ons Electronic Signature(s) Signed: 01/21/2021 5:09:47 PM By: [name] Hammock RN Entered By: [name] on 01/18/2021 15:42:11 -------------------------------------------------------------------------------- Vitals Details Patient Name: Date of Service: [name] Cane A. 01/18/2021 2:45 PM Medical Record Number: [id number] Patient Account Number: [id number] Date of Birth/Sex: Treating RN: 1961/04/11 (59 y.o. Male) [name] Primary Care Maebel Marasco: [name] Other Clinician: Referring Aidian Salomon: Treating Sylvia Kondracki/Extender:  [name] in Treatment: 0 Vital Signs Time Taken: 14:55 Temperature (F): 98.2 Height (in): 76 Pulse (bpm): 77 Source: Stated Respiratory Rate (breaths/min): 18 Weight (lbs): 191 Blood Pressure (mmHg): 211/99 Source: Stated Capillary Blood Glucose (mg/dl): 110 Body Mass Index (BMI): 23.2 Reference Range: 80 - 120 mg / dl Notes glucose per pt report Electronic Signature(s) Signed: 01/18/2021 5:47:53 PM By: [name] Gouty RN, BSN Entered By: [name] on 01/18/2021 14:47:01
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
## 2021-01-21 NOTE — Progress Notes (Signed)
Patient ID: [name], male   DOB: October 11, 1961, 60 y.o.   MRN: [id number] I was informed by Dr. Darl Householder of this patient's admission to the hospital with a seizure event at the dialysis unit. He again had a seizure here in the emergency room. He appears to have received his entire dialysis treatment based on the review of the outpatient dialysis record. He does not have any acute indication for additional dialysis at this time and a formal consult will follow based on his length of stay/dialysis requirements.  [name] MD [facility]. Office # [phone] Pager # [phone] 6:20 PM
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
## 2021-01-21 NOTE — ED Notes (Signed)
Pt had witnessed seizure while here. I witnessed and so did ED MD. Pt body stiffened up. I received verbal order to give pt ativan IM

## 2021-01-21 NOTE — Progress Notes (Signed)
Mulhearn Trevionne AMarland Kitchen ([id number]) , Visit Report for 01/18/2021 Chief Complaint Document Details Patient Name: Date of Service: HARLESS, LUMBARD 01/18/2021 2:45 PM Medical Record Number: [id number] Patient Account Number: [id number] Date of Birth/Sex: Treating RN: [birth year] (59 y.o. Male) Rhae Hammock Primary Care Provider: Glean Salvo Other Clinician: Referring Provider: Treating Provider/Extender: Cleon Gustin in Treatment: 0 Information Obtained from: Patient Chief Complaint Patient presents to the wound care center with open non-healing surgical wound(s) to the left forefoot which she's had for about a month 01/18/21; patient returns to clinic with wounds on his right anterior patella and 3 wounds more distally towards his right BKA stump Electronic Signature(s) Signed: 01/18/2021 5:22:09 PM By: Linton Ham MD Entered By: Linton Ham on 01/18/2021 16:14:42 -------------------------------------------------------------------------------- Debridement Details Patient Name: Date of Service: [name] 01/18/2021 2:45 PM Medical Record Number: [id number] Patient Account Number: [id number] Date of Birth/Sex: Treating RN: 01-May-1961 (59 y.o. Male) Rhae Hammock Primary Care Provider: Glean Salvo Other Clinician: Referring Provider: Treating Provider/Extender: Cleon Gustin in Treatment: 0 Debridement Performed for Assessment: Wound #14 Right,Proximal,Anterior Lower Leg Performed By: Physician Ricard Dillon., MD Debridement Type: Debridement Severity of Tissue Pre Debridement: Fat layer exposed Level of Consciousness (Pre-procedure): Awake and Alert Pre-procedure Verification/Time Out Yes - 15:40 Taken: Start Time: 15:40 Pain Control: Lidocaine T Area Debrided (L x W): otal 0.8 (cm) x 0.9 (cm) = 0.72 (cm) Tissue and other material debrided: Viable, Non-Viable, Eschar, Slough, Skin: Dermis , Skin:  Epidermis, Slough Level: Skin/Epidermis Debridement Description: Selective/Open Wound Instrument: Curette Bleeding: Minimum Hemostasis Achieved: Pressure End Time: 15:43 Procedural Pain: 0 Post Procedural Pain: 0 Response to Treatment: Procedure was tolerated well Level of Consciousness (Post- Awake and Alert procedure): Post Debridement Measurements of Total Wound Length: (cm) 0.8 Width: (cm) 0.9 Depth: (cm) 0.2 Volume: (cm) 0.113 Character of Wound/Ulcer Post Debridement: Improved Severity of Tissue Post Debridement: Fat layer exposed Post Procedure Diagnosis Same as Pre-procedure Electronic Signature(s) Signed: 01/18/2021 5:22:09 PM By: Linton Ham MD Signed: 01/21/2021 5:09:47 PM By: Rhae Hammock RN Entered By: Linton Ham on 01/18/2021 16:12:29 -------------------------------------------------------------------------------- Debridement Details Patient Name: Date of Service: [name] 01/18/2021 2:45 PM Medical Record Number: [id number] Patient Account Number: [id number] Date of Birth/Sex: Treating RN: 17-Dec-1960 (59 y.o. Male) Rhae Hammock Primary Care Provider: Glean Salvo Other Clinician: Referring Provider: Treating Provider/Extender: Cleon Gustin in Treatment: 0 Debridement Performed for Assessment: Wound #15 Right,Anterior Lower Leg Performed By: Physician Ricard Dillon., MD Debridement Type: Debridement Severity of Tissue Pre Debridement: Fat layer exposed Level of Consciousness (Pre-procedure): Awake and Alert Pre-procedure Verification/Time Out Yes - 15:44 Taken: Start Time: 15:44 Pain Control: Lidocaine T Area Debrided (L x W): otal 1 (cm) x 0.8 (cm) = 0.8 (cm) Tissue and other material debrided: Viable, Non-Viable, Eschar, Slough, Skin: Dermis , Skin: Epidermis, Slough Level: Skin/Epidermis Debridement Description: Selective/Open Wound Instrument: Curette Bleeding: Minimum Hemostasis  Achieved: Pressure End Time: 15:45 Procedural Pain: 0 Post Procedural Pain: 0 Response to Treatment: Procedure was tolerated well Level of Consciousness (Post- Awake and Alert procedure): Post Debridement Measurements of Total Wound Length: (cm) 1 Width: (cm) 0.8 Depth: (cm) 0.2 Volume: (cm) 0.126 Character of Wound/Ulcer Post Debridement: Improved Severity of Tissue Post Debridement: Fat layer exposed Post Procedure Diagnosis Same as Pre-procedure Electronic Signature(s) Signed: 01/18/2021 5:22:09 PM By: Linton Ham MD Signed: 01/21/2021 5:09:47 PM By: Rhae Hammock RN Entered By: Linton Ham on 01/18/2021 16:12:39 -------------------------------------------------------------------------------- HPI Details Patient Name:  Date of Service: JARIAN, ODETTE 01/18/2021 2:45 PM Medical Record Number: [id number] Patient Account Number: [id number] Date of Birth/Sex: Treating RN: Jan 01, 1961 (59 y.o. Male) Rhae Hammock Primary Care Provider: Glean Salvo Other Clinician: Referring Provider: Treating Provider/Extender: Cleon Gustin in Treatment: 0 History of Present Illness Location: left forefoot at the site of the left TMA Quality: Patient reports experiencing a dull pain to affected area(s). Severity: Patient states wound(s) are getting worse. Duration: Patient has had the wound for 5 weeks prior to presenting for treatment Timing: Pain in wound is constant (hurts all the time) Context: The wound would happen gradually Modifying Factors: Patient wound(s)/ulcer(s) are worsening due to :discharge from the wound and nonhealing left TMA site ssociated Signs and Symptoms: Patient reports having increase discharge. A HPI Description: 60 year old patient referred to as by Dr. [name] of orthopedics and is 4 weeks post left transmetatarsal amputation. Pain is operative note he had this procedure done on 03/31/2017 where his postoperative  diagnoses left plantar forefoot diabetic ulcer, left foot osteomyelitis and left foot deep abscess. He had a left percutaneous heel cord lengthening, left foot transmetatarsal amputation and irrigation and excisional debridement of left foot deep abscess including multiple areas. The patient is a diabetic and also has renal disease and has had previous amputations on the right foot. He was admitted for IV antibiotics and found to have osteomyelitis of the third toe and severe third toe hammertoe deformity. Patient was seen by infectious disease and had IV antibiotics given. Patient also has had a right transmetatarsal amputation done a while ago and had a previous history of osteomyelitis of the right second toe. He is not a smoker. He has an AV fistula placed on his left forearm but this has not been used yet. 05/09/2017 -- Addendum on 05/09/2017 -- spoke to Dr. [name] regarding the debridement and he agreed with the plan but could not get to the elective surgery for a couple of weeks and hence I suggested that we could ask to see if plastic surgery would be able to do this for [place] and get a wound VAC in place. 05/15/2017 -- aerobic culture showed rare gram-positive rods and rare gram positive cocci in pairs culture report did not show any predominant growth. I spoke to Dr. [name] on 05/09/2017 regarding the possible surgical debridement and he could not see the patient for 2 weeks. I also spoke to the plastic surgeon Dr. Iran Planas and she could not get to the patient for about 3 weeks. We will try to get this patient to surgery as soon as possible. he has not had a follow-up with the infectious disease doctors regarding further IV antibiotics and have asked them to do this. 05/22/2017 -- the patient has not been able to see the infectious disease doctor nor has he seen Dr. Doran Durand yet. I understand he has an appointment to see Dr. Doran Durand tomorrow READMISSION 01/18/2021; This patient is now  60 years old. He was in this clinic in 2013, and then again in 2018 when he was cared for by Dr. Con Memos. He had wounds on his left foot and left lower extremity. He was discharged to the care of orthopedics and infectious disease for osteomyelitis. 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 Most of the recent information on this man over the course of of the last year has to do with  bilateral ischemic wounds in the fingers and his hands. He has been followed by Dr. Burney Gauze of orthopedic hand surgery as well as Dr. Fredna Dow. He has had several amputations here irrigation and debridements. He has had multiple fingers amputated however he did not come in to see [place] about this. He has 4 open areas on the right lower leg a fairly large 1 with undermining on the right anterior patella, 2 areas over the right tibial tuberosity and an area more distally towards his stump. All of this quite painful. The area has been open on the right knee for the last 5 to 6 months and he has not recently worn the right prosthesis because of this. He has not dressing this aggressively using iodine and a Band-Aid. There is quite a bit of pain in his right lower extremity. Past medical history was reviewed, he has type 2 diabetes with now chronic renal failure on dialysis since 2019, he is no longer on anything for his diabetes, bilateral BKA's as noted, he is an ex-smoker. He has chronic hepatitis C Electronic Signature(s) Signed: 01/18/2021 5:22:09 PM By: Linton Ham MD Entered By: Linton Ham on 01/18/2021 16:19:33 -------------------------------------------------------------------------------- Physical Exam Details Patient Name: Date of Service: Cena Benton 01/18/2021 2:45 PM Medical Record Number: [id number] Patient Account Number: [id number] Date of Birth/Sex: Treating RN: October 12, 1961 (59 y.o. Male) Rhae Hammock Primary Care Provider:  Glean Salvo Other Clinician: Referring Provider: Treating Provider/Extender: Cleon Gustin in Treatment: 0 Constitutional Patient is hypertensive. Says he has not taken his medications today. Pulse regular and within target range for patient.Marland Kitchen Respirations regular, non-labored and within target range.. Temperature is normal and within the target range for the patient.Marland Kitchen Appears in no distress. Respiratory work of breathing is normal. Cardiovascular His femoral pulses palpable but the right popliteal pulses not. His right stump is slightly cooler than his left. Psychiatric Patient appears depressed today.. Notes Wound exam; The largest areas over the right patella. Quite a amount of undermining from roughly 11:00 to 4. There is exposed muscle but no exposed bone. She has 2 areas over the right tibial tuberosity both of these had a thick eschar on them but this was clearly not healed I remove this with a #3 curette with some difficulty. There is open areas underneath this He has a superficial area more towards the stump of the right tibia anteriorly. No debridement was done here. Electronic Signature(s) Signed: 01/18/2021 5:22:09 PM By: Linton Ham MD Entered By: Linton Ham on 01/18/2021 16:21:09 -------------------------------------------------------------------------------- Physician Orders Details Patient Name: Date of Service: [name] 01/18/2021 2:45 PM Medical Record Number: [id number] Patient Account Number: [id number] Date of Birth/Sex: Treating RN: 09-23-1961 (59 y.o. Male) Rhae Hammock Primary Care Provider: Glean Salvo Other Clinician: Referring Provider: Treating Provider/Extender: Cleon Gustin in Treatment: 0 Verbal / Phone Orders: No Diagnosis Coding Follow-up Appointments Return Appointment in 1 week. Bathing/ Shower/ Hygiene May shower with protection but do not get wound dressing(s)  wet. - May use cast protector in shower to cover dressings and keep dry. Wound Treatment Wound #12 - Knee Wound Laterality: Right Cleanser: Soap and Water Every Other Day/15 Days Discharge Instructions: May shower and wash wound with dial antibacterial soap and water prior to dressing change. Cleanser: Wound Cleanser (DME) (Generic) Every Other Day/15 Days Discharge Instructions: Cleanse the wound with wound cleanser prior to applying a clean dressing using gauze sponges, not tissue or cotton balls. Prim Dressing: Promogran Prisma Matrix, 4.34 (  sq in) (silver collagen) (DME) (Generic) Every Other Day/15 Days ary Discharge Instructions: Moisten collagen with saline or hydrogel Secondary Dressing: Woven Gauze Sponge, Non-Sterile 4x4 in (DME) (Generic) Every Other Day/15 Days Discharge Instructions: Apply over primary dressing as directed. Secondary Dressing: ABD Pad, 5x9 (DME) Every Other Day/15 Days Discharge Instructions: Apply over primary dressing as directed. Secured With: [employer], 4.5x3.1 (in/yd) (DME) (Generic) Every Other Day/15 Days Discharge Instructions: Secure with Kerlix as directed. Secured With: 64M Medipore H Soft Cloth Surgical Tape, 2x2 (in/yd) (DME) (Generic) Every Other Day/15 Days Discharge Instructions: Secure dressing with tape as directed. Wound #13 - Amputation Site - Below Knee Wound Laterality: Right Cleanser: Soap and Water Every Other Day/15 Days Discharge Instructions: May shower and wash wound with dial antibacterial soap and water prior to dressing change. Cleanser: Wound Cleanser (DME) (Generic) Every Other Day/15 Days Discharge Instructions: Cleanse the wound with wound cleanser prior to applying a clean dressing using gauze sponges, not tissue or cotton balls. Prim Dressing: Promogran Prisma Matrix, 4.34 (sq in) (silver collagen) (DME) (Generic) Every Other Day/15 Days ary Discharge Instructions: Moisten collagen with saline or hydrogel Secondary  Dressing: Woven Gauze Sponge, Non-Sterile 4x4 in (DME) (Generic) Every Other Day/15 Days Discharge Instructions: Apply over primary dressing as directed. Secondary Dressing: ABD Pad, 5x9 (DME) Every Other Day/15 Days Discharge Instructions: Apply over primary dressing as directed. Secured With: [employer], 4.5x3.1 (in/yd) (DME) (Generic) Every Other Day/15 Days Discharge Instructions: Secure with Kerlix as directed. Secured With: 64M Medipore H Soft Cloth Surgical Tape, 2x2 (in/yd) (DME) (Generic) Every Other Day/15 Days Discharge Instructions: Secure dressing with tape as directed. Wound #14 - Lower Leg Wound Laterality: Right, Anterior, Proximal Cleanser: Soap and Water Every Other Day/15 Days Discharge Instructions: May shower and wash wound with dial antibacterial soap and water prior to dressing change. Cleanser: Wound Cleanser (DME) (Generic) Every Other Day/15 Days Discharge Instructions: Cleanse the wound with wound cleanser prior to applying a clean dressing using gauze sponges, not tissue or cotton balls. Prim Dressing: Promogran Prisma Matrix, 4.34 (sq in) (silver collagen) (DME) (Generic) Every Other Day/15 Days ary Discharge Instructions: Moisten collagen with saline or hydrogel Secondary Dressing: Woven Gauze Sponge, Non-Sterile 4x4 in (DME) (Generic) Every Other Day/15 Days Discharge Instructions: Apply over primary dressing as directed. Secondary Dressing: ABD Pad, 5x9 (DME) Every Other Day/15 Days Discharge Instructions: Apply over primary dressing as directed. Secured With: [employer], 4.5x3.1 (in/yd) (DME) (Generic) Every Other Day/15 Days Discharge Instructions: Secure with Kerlix as directed. Secured With: 64M Medipore H Soft Cloth Surgical Tape, 2x2 (in/yd) (DME) (Generic) Every Other Day/15 Days Discharge Instructions: Secure dressing with tape as directed. Wound #15 - Lower Leg Wound Laterality: Right, Anterior Cleanser: Soap and Water Every Other  Day/15 Days Discharge Instructions: May shower and wash wound with dial antibacterial soap and water prior to dressing change. Cleanser: Wound Cleanser (DME) (Generic) Every Other Day/15 Days Discharge Instructions: Cleanse the wound with wound cleanser prior to applying a clean dressing using gauze sponges, not tissue or cotton balls. Prim Dressing: Promogran Prisma Matrix, 4.34 (sq in) (silver collagen) (DME) (Generic) Every Other Day/15 Days ary Discharge Instructions: Moisten collagen with saline or hydrogel Secondary Dressing: Woven Gauze Sponge, Non-Sterile 4x4 in (DME) (Generic) Every Other Day/15 Days Discharge Instructions: Apply over primary dressing as directed. Secondary Dressing: ABD Pad, 5x9 (DME) Every Other Day/15 Days Discharge Instructions: Apply over primary dressing as directed. Secured With: [employer], 4.5x3.1 (in/yd) (DME) (Generic) Every Other  Day/15 Days Discharge Instructions: Secure with Kerlix as directed. Secured With: 63M Medipore H Soft Cloth Surgical Tape, 2x2 (in/yd) (DME) (Generic) Every Other Day/15 Days Discharge Instructions: Secure dressing with tape as directed. Services and Therapies nkle Brachial Index (ABI) - Refer to [facility] Electronic Signature(s) Signed: 01/18/2021 5:22:09 PM By: Linton Ham MD Signed: 01/21/2021 5:09:47 PM By: Rhae Hammock RN Entered By: Rhae Hammock on 01/18/2021 15:47:54 -------------------------------------------------------------------------------- Problem List Details Patient Name: Date of Service: [name] 01/18/2021 2:45 PM Medical Record Number: [id number] Patient Account Number: [id number] Date of Birth/Sex: Treating RN: 1961/01/11 (59 y.o. 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 01/18/2021 2:45 PM Medical Record Number: [id number] Patient Account Number: [id number] Date of Birth/Sex: Treating RN: 1961-09-11 (59 y.o. Male) Rhae Hammock Primary Care Provider: Glean Salvo Other Clinician: Referring Provider: Treating Provider/Extender: Cleon Gustin in Treatment: 0 Subjective Chief Complaint Information obtained from Patient Patient presents to the wound care center with open non-healing surgical wound(s) to the left forefoot which she's had for about a month 01/18/21; patient returns to clinic with wounds on his right anterior patella and 3 wounds more distally towards his right BKA stump History of Present Illness (HPI) The following HPI elements were documented for the patient's wound: Location: left forefoot at the site of the left TMA Quality: Patient reports experiencing a dull pain to affected area(s). Severity: Patient states wound(s) are getting worse. Duration: Patient has had the wound for 5 weeks prior to presenting for treatment Timing: Pain in wound is constant (hurts all the time) Context: The wound would happen gradually Modifying Factors: Patient wound(s)/ulcer(s) are worsening due to :discharge from the wound and nonhealing left TMA site Associated Signs and Symptoms: Patient  reports having increase discharge. 60 year old patient referred to as by Dr. [name] of orthopedics and is 4 weeks post left transmetatarsal amputation. Pain is operative note he had this procedure done on 03/31/2017 where his postoperative diagnoses left plantar forefoot diabetic ulcer, left foot osteomyelitis and left foot deep abscess. He had a left percutaneous heel cord lengthening, left foot transmetatarsal amputation and irrigation and excisional debridement of left foot deep abscess including multiple areas. The patient is a diabetic and also has renal disease and has had previous amputations on the right foot. He was admitted for IV antibiotics and found to have osteomyelitis of the third toe and severe third toe hammertoe deformity. Patient was seen by infectious disease and had IV antibiotics given. Patient also has had a right transmetatarsal amputation done a while ago and had a previous history of osteomyelitis of the right second toe. He is not a smoker. He has an AV fistula placed on his left forearm but this has not been used yet. 05/09/2017 -- Addendum on 05/09/2017 -- spoke to Dr. [name] regarding the debridement and he agreed with the plan but could not  get to the elective surgery for a couple of weeks and hence I suggested that we could ask to see if plastic surgery would be able to do this for [place] and get a wound VAC in place. 05/15/2017 -- aerobic culture showed rare gram-positive rods and rare gram positive cocci in pairs culture report did not show any predominant growth. I spoke to Dr. [name] on 05/09/2017 regarding the possible surgical debridement and he could not see the patient for 2 weeks. I also spoke to the plastic surgeon Dr. Iran Planas and she could not get to the patient for about 3 weeks. We will try to get this patient to surgery as soon as possible. he has not had a follow-up with the infectious disease doctors regarding further IV antibiotics and have  asked them to do this. 05/22/2017 -- the patient has not been able to see the infectious disease doctor nor has he seen Dr. Doran Durand yet. I understand he has an appointment to see Dr. Doran Durand tomorrow READMISSION 01/18/2021; This patient is now 60 years old. He was in this clinic in 2013, and then again in 2018 when he was cared for by Dr. Con Memos. He had wounds on his left foot and left lower extremity. He was discharged to the care of orthopedics and infectious disease for osteomyelitis. 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 Most of the recent information on this man over the course of of the last year has to do with bilateral ischemic wounds in the fingers and his hands. He has been followed by Dr. Burney Gauze of orthopedic hand surgery as well as Dr. Fredna Dow. He has had several amputations here irrigation and debridements. He has had multiple fingers amputated however he did not come in to see [place] about this. He has 4 open areas on the right lower leg a fairly large 1 with undermining on the right anterior patella, 2 areas over the right tibial tuberosity and an area more distally towards his stump. All of this quite painful. The area has been open on the right knee for the last 5 to 6 months and he has not recently worn the right prosthesis because of this. He has not dressing this aggressively using iodine and a Band-Aid. There is quite a bit of pain in his right lower extremity. Past medical history was reviewed, he has type 2 diabetes with now chronic renal failure on dialysis since 2019, he is no longer on anything for his diabetes, bilateral BKA's as noted, he is an ex-smoker. He has chronic hepatitis C Patient History Information obtained from Patient. Allergies No Known Allergies Family History Diabetes - Mother,Father,Siblings, Heart Disease - Mother,Father,Siblings, Hypertension - Mother,Father,Siblings, No family  history of Hereditary Spherocytosis, Kidney Disease, Lung Disease, Seizures, Stroke, Thyroid Problems, Tuberculosis. Social History Former smoker, Marital Status - Married, Alcohol Use - Never, Drug Use - No History, Caffeine Use - Rarely. Medical History Eyes Denies history of Cataracts, Glaucoma Hematologic/Lymphatic Patient has history of Anemia Cardiovascular Patient has history of Hypertension, Peripheral Arterial Disease Gastrointestinal Patient has history of Hepatitis C Endocrine Patient has history of Type II Diabetes Genitourinary Patient has history of End Stage Renal Disease - HD Immunological Denies history of Lupus Erythematosus, Raynaudoos, Scleroderma Integumentary (Skin) Denies history of History of Burn Musculoskeletal Patient has history of Osteoarthritis, Osteomyelitis Neurologic Patient has history of Neuropathy Oncologic Denies history of Received Chemotherapy, Received Radiation Psychiatric Denies  history of Anorexia/bulimia, Confinement Anxiety Patient is treated with Controlled Diet. Blood sugar is not tested. Hospitalization/Surgery History - left BKA. - Right BKA. - left 2nd finger amputation. - right 2nd, 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. Medical A Surgical History Notes nd Cardiovascular ischemia of fingers, hyperlipidemia Gastrointestinal GERD, drug induced constipation Review of Systems (ROS) Eyes Denies complaints or symptoms of Dry Eyes, Vision Changes, Glasses / Contacts. Ear/Nose/Mouth/Throat Denies complaints or symptoms of Chronic sinus problems or rhinitis. Respiratory Denies complaints or symptoms of Chronic or frequent coughs, Shortness of Breath. Endocrine Denies complaints or symptoms of Heat/cold intolerance. Genitourinary Denies complaints or symptoms of Frequent urination. Integumentary (Skin) Complains or has symptoms of Wounds - right knee. Musculoskeletal Complains or has symptoms of Muscle Pain. Neurologic Denies  complaints or symptoms of Numbness/parasthesias. Psychiatric Denies complaints or symptoms of Claustrophobia, Suicidal. Objective Constitutional Patient is hypertensive. Says he has not taken his medications today. Pulse regular and within target range for patient.Marland Kitchen Respirations regular, non-labored and within target range.. Temperature is normal and within the target range for the patient.Marland Kitchen Appears in no distress. Vitals Time Taken: 2:55 PM, Height: 76 in, Source: Stated, Weight: 191 lbs, Source: Stated, BMI: 23.2, Temperature: 98.2 F, Pulse: 77 bpm, Respiratory Rate: 18 breaths/min, Blood Pressure: 211/99 mmHg, Capillary Blood Glucose: 110 mg/dl. General Notes: glucose per pt report Respiratory work of breathing is normal. Cardiovascular His femoral pulses palpable but the right popliteal pulses not. His right stump is slightly cooler than his left. Psychiatric Patient appears depressed today.. General Notes: Wound exam; ooThe largest areas over the right patella. Quite a amount of undermining from roughly 11:00 to 4. There is exposed muscle but no exposed bone. ooShe has 2 areas over the right tibial tuberosity both of these had a thick eschar on them but this was clearly not healed I remove this with a #3 curette with some difficulty. There is open areas underneath this ooHe has a superficial area more towards the stump of the right tibia anteriorly. No debridement was done here. Integumentary (Hair, Skin) Wound #12 status is Open. Original cause of wound was Pressure Injury. The wound is located on the Right Knee. The wound measures 2.3cm length x 2.3cm width x 0.6cm depth; 4.155cm^2 area and 2.493cm^3 volume. There is muscle and Fat Layer (Subcutaneous Tissue) exposed. There is no tunneling noted, however, there is undermining starting at 11:00 and ending at 4:00 with a maximum distance of 1.5cm. There is a medium amount of serosanguineous drainage noted. The wound margin is  epibole. There is small (1-33%) red granulation within the wound bed. There is a large (67-100%) amount of necrotic tissue within the wound bed including Adherent Slough. Wound #13 status is Open. Original cause of wound was Pressure Injury. The wound is located on the Right Amputation Site - Below Knee. The wound measures 0.7cm length x 1.7cm width x 0.1cm depth; 0.935cm^2 area and 0.093cm^3 volume. There is Fat Layer (Subcutaneous Tissue) exposed. There is no tunneling or undermining noted. There is a none present amount of drainage noted. The wound margin is flat and intact. There is no granulation within the wound bed. There is no necrotic tissue within the wound bed. General Notes: dry wound bed Wound #14 status is Open. Original cause of wound was Blister. The wound is located on the Right,Proximal,Anterior Lower Leg. The wound measures 0.8cm length x 0.9cm width x 0.2cm depth; 0.565cm^2 area and 0.113cm^3 volume. There is no tunneling or undermining noted. There is a medium amount of  serosanguineous drainage noted. The wound margin is distinct with the outline attached to the wound base. There is medium (34-66%) red, pink granulation within the wound bed. There is a medium (34-66%) amount of necrotic tissue within the wound bed including Eschar and Adherent Slough. Wound #15 status is Open. Original cause of wound was Blister. The wound is located on the Right,Anterior Lower Leg. The wound measures 1cm length x 0.8cm width x 0.2cm depth; 0.628cm^2 area and 0.126cm^3 volume. There is no tunneling or undermining noted. There is a medium amount of serosanguineous drainage noted. The wound margin is distinct with the outline attached to the wound base. There is medium (34-66%) red, pink granulation within the wound bed. There is a medium (34-66%) amount of necrotic tissue within the wound bed including Eschar and Adherent Slough. 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 There was a Selective/Open Wound Skin/Epidermis Debridement with a total area of 0.72 sq cm performed by Ricard Dillon., MD. With the following instrument(s): Curette to remove Viable and Non-Viable tissue/material. Material removed includes Eschar, Slough, Skin: Dermis, and Skin: Epidermis after achieving pain control using Lidocaine. No specimens were taken. A time out was conducted at 15:40, prior to the start of the procedure. A Minimum amount of bleeding was controlled with Pressure. The procedure was tolerated well with a pain level of 0 throughout and a pain level of 0 following the procedure. Post Debridement Measurements: 0.8cm length x 0.9cm width x 0.2cm depth; 0.113cm^3 volume. Character of Wound/Ulcer Post Debridement is improved. Severity of Tissue Post Debridement is: Fat layer exposed. Post procedure Diagnosis Wound #14: Same as Pre-Procedure Wound #15 Pre-procedure diagnosis of Wound #15 is an Arterial Insufficiency Ulcer located on the Right,Anterior Lower Leg .Severity of Tissue Pre Debridement is: Fat layer exposed. There was a Selective/Open Wound Skin/Epidermis Debridement with a total area of 0.8 sq cm performed by Ricard Dillon., MD. With the following instrument(s): Curette to remove Viable and Non-Viable tissue/material. Material removed includes Eschar, Slough, Skin: Dermis, and Skin: Epidermis after achieving pain control using Lidocaine. No specimens were taken. A time out was conducted at 15:44, prior to the start of the procedure. A Minimum amount of bleeding was controlled with  Pressure. The procedure was tolerated well with a pain level of 0 throughout and a pain level of 0 following the procedure. Post Debridement Measurements: 1cm length x 0.8cm width x 0.2cm depth; 0.126cm^3 volume. Character of Wound/Ulcer Post Debridement is improved. Severity of Tissue Post Debridement is: Fat layer exposed. Post procedure Diagnosis Wound #15: Same as Pre-Procedure Plan Follow-up Appointments: Return Appointment in 1 week. Bathing/ Shower/ Hygiene: May shower with protection but do not get wound dressing(s) wet. - May use cast protector in shower to cover dressings and keep dry. Services and Therapies ordered were: Ankle Brachial Index (ABI) - Refer to [facility] V[facility] WOUND #12: - Knee Wound Laterality: Right Cleanser: Soap and Water Every Other Day/15 Days Discharge Instructions: May shower and wash wound with dial antibacterial soap and water prior to dressing change. Cleanser: Wound Cleanser (DME) (Generic) Every Other Day/15 Days Discharge Instructions: Cleanse the wound with wound cleanser prior to applying  a clean dressing using gauze sponges, not tissue or cotton balls. Prim Dressing: Promogran Prisma Matrix, 4.34 (sq in) (silver collagen) (DME) (Generic) Every Other Day/15 Days ary Discharge Instructions: Moisten collagen with saline or hydrogel Secondary Dressing: Woven Gauze Sponge, Non-Sterile 4x4 in (DME) (Generic) Every Other Day/15 Days Discharge Instructions: Apply over primary dressing as directed. Secondary Dressing: ABD Pad, 5x9 (DME) Every Other Day/15 Days Discharge Instructions: Apply over primary dressing as directed. Secured With: [employer], 4.5x3.1 (in/yd) (DME) (Generic) Every Other Day/15 Days Discharge Instructions: Secure with Kerlix as directed. Secured With: 67M Medipore H Soft Cloth Surgical T ape, 2x2 (in/yd) (DME) (Generic) Every Other Day/15 Days Discharge Instructions: Secure dressing with tape as directed. WOUND #13: -  Amputation Site - Below Knee Wound Laterality: Right Cleanser: Soap and Water Every Other Day/15 Days Discharge Instructions: May shower and wash wound with dial antibacterial soap and water prior to dressing change. Cleanser: Wound Cleanser (DME) (Generic) Every Other Day/15 Days Discharge Instructions: Cleanse the wound with wound cleanser prior to applying a clean dressing using gauze sponges, not tissue or cotton balls. Prim Dressing: Promogran Prisma Matrix, 4.34 (sq in) (silver collagen) (DME) (Generic) Every Other Day/15 Days ary Discharge Instructions: Moisten collagen with saline or hydrogel Secondary Dressing: Woven Gauze Sponge, Non-Sterile 4x4 in (DME) (Generic) Every Other Day/15 Days Discharge Instructions: Apply over primary dressing as directed. Secondary Dressing: ABD Pad, 5x9 (DME) Every Other Day/15 Days Discharge Instructions: Apply over primary dressing as directed. Secured With: [employer], 4.5x3.1 (in/yd) (DME) (Generic) Every Other Day/15 Days Discharge Instructions: Secure with Kerlix as directed. Secured With: 67M Medipore H Soft Cloth Surgical T ape, 2x2 (in/yd) (DME) (Generic) Every Other Day/15 Days Discharge Instructions: Secure dressing with tape as directed. WOUND #14: - Lower Leg Wound Laterality: Right, Anterior, Proximal Cleanser: Soap and Water Every Other Day/15 Days Discharge Instructions: May shower and wash wound with dial antibacterial soap and water prior to dressing change. Cleanser: Wound Cleanser (DME) (Generic) Every Other Day/15 Days Discharge Instructions: Cleanse the wound with wound cleanser prior to applying a clean dressing using gauze sponges, not tissue or cotton balls. Prim Dressing: Promogran Prisma Matrix, 4.34 (sq in) (silver collagen) (DME) (Generic) Every Other Day/15 Days ary Discharge Instructions: Moisten collagen with saline or hydrogel Secondary Dressing: Woven Gauze Sponge, Non-Sterile 4x4 in (DME) (Generic) Every Other  Day/15 Days Discharge Instructions: Apply over primary dressing as directed. Secondary Dressing: ABD Pad, 5x9 (DME) Every Other Day/15 Days Discharge Instructions: Apply over primary dressing as directed. Secured With: [employer], 4.5x3.1 (in/yd) (DME) (Generic) Every Other Day/15 Days Discharge Instructions: Secure with Kerlix as directed. Secured With: 67M Medipore H Soft Cloth Surgical T ape, 2x2 (in/yd) (DME) (Generic) Every Other Day/15 Days Discharge Instructions: Secure dressing with tape as directed. WOUND #15: - Lower Leg Wound Laterality: Right, Anterior Cleanser: Soap and Water Every Other Day/15 Days Discharge Instructions: May shower and wash wound with dial antibacterial soap and water prior to dressing change. Cleanser: Wound Cleanser (DME) (Generic) Every Other Day/15 Days Discharge Instructions: Cleanse the wound with wound cleanser prior to applying a clean dressing using gauze sponges, not tissue or cotton balls. Prim Dressing: Promogran Prisma Matrix, 4.34 (sq in) (silver collagen) (DME) (Generic) Every Other Day/15 Days ary Discharge Instructions: Moisten collagen with saline or hydrogel Secondary Dressing: Woven Gauze Sponge, Non-Sterile 4x4 in (DME) (Generic) Every Other Day/15 Days Discharge Instructions: Apply over primary dressing as directed. Secondary Dressing: ABD Pad, 5x9 (DME) Every Other Day/15  Days Discharge Instructions: Apply over primary dressing as directed. Secured With: [employer], 4.5x3.1 (in/yd) (DME) (Generic) Every Other Day/15 Days Discharge Instructions: Secure with Kerlix as directed. Secured With: 88M Medipore H Soft Cloth Surgical T ape, 2x2 (in/yd) (DME) (Generic) Every Other Day/15 Days Discharge Instructions: Secure dressing with tape as directed. 1. This patient has what I think are ischemic wounds on his distal right anterior lower leg ending towards his stump. I have reviewed his last angiogram which was done by Dr. 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 His right common femoral and profunda were widely patent he did have several areas of approximately 50% stenosis throughout the right SFA that was heavily calcified it was not felt at that time that this was flow-limiting. They stated he had a palpable popliteal pulse at the time I do not think he has that now. He had terrible tibial disease but this is now a moot point. I am going to send him back to vein and vascular for an arterial Doppler of the right leg. I suspect he will have severe stenosis of the SFA now. The question will be whether he is a candidate for an angiogram for endovascular intervention. 2. He is not using his prosthesis which is quite reasonable and I have encouraged him to continue this. 3. We will use silver collagen moistened with hydrogel to the wounds change every second day 4. No evidence of infection 5. He is necrotic gangrenous areas on his fingertips I did not look at these today in any detail he is followed by hand surgery. 6. I note that he was seen by palliative care on 10/21/2020 I will need to bring this up with him next time make sure that he wants aggressive consult with vein and vascular I spent 45 minutes in review of this patient's past medical history, face-to-face evaluation and preparation of this record. Electronic Signature(s) Signed: 01/18/2021 5:22:09 PM By: Linton Ham MD Entered By: Linton Ham on 01/18/2021 16:52:53 -------------------------------------------------------------------------------- HxROS Details Patient Name: Date of Service: [name] 01/18/2021 2:45 PM Medical Record Number: [id number] Patient Account Number: [id number] Date of Birth/Sex: Treating RN: 1961-06-07 (59 y.o. Male) [name] Primary Care Provider: Glean Salvo Other Clinician: Referring Provider: Treating Provider/Extender: Cleon Gustin in Treatment: 0 Information Obtained From Patient Eyes Complaints and Symptoms: Negative for: Dry Eyes; Vision Changes; Glasses / Contacts Medical History: Negative for: Cataracts; Glaucoma Ear/Nose/Mouth/Throat Complaints and Symptoms: Negative for: Chronic sinus problems or rhinitis Respiratory Complaints and Symptoms: Negative for: Chronic or frequent coughs; Shortness of Breath Endocrine Complaints and Symptoms: Negative for: Heat/cold intolerance Medical History: Positive for: Type II Diabetes Time with diabetes: 20 years Treated with: Diet Blood sugar tested every day: No Genitourinary Complaints and Symptoms: Negative for: Frequent urination Medical History: Positive for: End Stage Renal Disease - HD Integumentary (Skin) Complaints and Symptoms: Positive for: Wounds - right knee Medical History: Negative for: History of Burn Musculoskeletal Complaints and Symptoms: Positive for: Muscle Pain Medical History: Positive for: Osteoarthritis; Osteomyelitis Neurologic Complaints and Symptoms: Negative for: Numbness/parasthesias Medical History: Positive for: Neuropathy Psychiatric Complaints and Symptoms: Negative for: Claustrophobia; Suicidal Medical History: Negative for: Anorexia/bulimia; Confinement Anxiety Constitutional Symptoms (General Health) Hematologic/Lymphatic Medical History: Positive for: Anemia Cardiovascular Medical History: Positive for: Hypertension; Peripheral Arterial Disease Past Medical History Notes: ischemia of fingers, hyperlipidemia Gastrointestinal Medical History: Positive for: Hepatitis C Past Medical History Notes:  GERD, drug induced constipation Immunological Medical History: Negative for: Lupus Erythematosus; Raynauds; Scleroderma Oncologic Medical History: Negative for: Received Chemotherapy; Received Radiation Immunizations Pneumococcal Vaccine: Received Pneumococcal Vaccination: Yes Implantable  Devices No devices added Hospitalization / Surgery History 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, 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 Family and Social History Diabetes: Yes - Mother,Father,Siblings; Heart Disease: Yes - Mother,Father,Siblings; Hereditary Spherocytosis: No; Hypertension: Yes - Mother,Father,Siblings; Kidney Disease: No; Lung Disease: No; Seizures: No; Stroke: No; Thyroid Problems: No; Tuberculosis: No; Former smoker; Marital Status - Married; Alcohol Use: Never; Drug Use: No History; Caffeine Use: Rarely; Financial Concerns: No; Food, Clothing or Shelter Needs: No; Support System Lacking: No; Transportation Concerns: No Electronic Signature(s) Signed: 01/18/2021 5:22:09 PM By: Linton Ham MD Signed: 01/18/2021 5:47:53 PM By: [name] RN, BSN Entered By: [name] on 01/18/2021 14:55:39 -------------------------------------------------------------------------------- [place] Details Patient Name: Date of Service: Cena Benton 01/18/2021 Medical Record Number: [id number] Patient Account Number: [id number] Date of Birth/Sex: Treating RN: 05/09/1961 (59 y.o. 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 E11.51 Type 2 diabetes mellitus with diabetic peripheral angiopathy without gangrene Facility Procedures CPT4 Code: [id number] Description: [id number] - WOUND CARE VISIT-LEV 5 NEW PT Modifier: Quantity: 1 CPT4 Code: [id number] Description: [id number] - DEBRIDE WOUND 1ST 20 SQ CM OR < ICD-10 Diagnosis Description L97.813 Non-pressure chronic ulcer of other part of right lower leg with  necrosis of mus Modifier: cle Quantity: 1 Physician Procedures : CPT4 Code Description Modifier [id number] - WC PHYS LEVEL 4 - NEW PT 25 ICD-10 Diagnosis Description E11.622 Type 2 diabetes mellitus with other skin ulcer L97.813 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

## 2021-01-21 NOTE — ED Notes (Signed)
Sheppard Coil, MD made aware of critical lab value: troponin 132.

## 2021-01-21 NOTE — ED Provider Notes (Signed)
  Physical Exam  BP (!) 192/86   Pulse 77   Temp 97.8 F (36.6 C) (Axillary)   Resp 15   Ht '6\' 4"'$  (1.93 m)   Wt 81.6 kg   SpO2 99%   BMI 21.90 kg/m   Physical Exam  ED Course/Procedures     Procedures  MDM  Care assumed at 4 PM.  Patient is a dialysis center and had a seizure-like activity.  Patient then had another episode of seizure at 3 PM and witnessed in the ED.  Signout pending CT head and labs and likely admission.  5:37 PM  Patient was loaded with Keppra.  At this point, he is still very somnolent.  He has no obvious eye deviation or tonic-clonic activity.  He has no clonus but he is not awake and alert.  His electrolytes are baseline and his potassium is 4.5. Talked to Dr. Charlsie Merles from neurology who will see patient and will consult on patient. Will admit for prolonged post ictal period after seizure. Dr. Posey Pronto from nephrology aware of patient. He doesn't need emergent dialysis tonight    Drenda Freeze, MD 01/21/21 1818

## 2021-01-21 NOTE — Progress Notes (Signed)
Pt admitted to the unit from ED via stretcher. Pt alert but drowsy, post ictal, no pressure ulcer noted except for wound with dsg on R BKA. Telemetry applied and verified with CCMD, NT called to second verify. Pt BP elevated and PRN hydralazine administered. IV intact and transfusing. Pt in bed sleeping and call light within reach and bed alarm on. Will closely monitor. Delia Heady RN   01/21/21 2204  Vital Signs  BP (!) 191/89  BP Location Right Arm  Patient Position (if appropriate) Lying  Pulse Rate 68  Pulse Rate Source Monitor  Resp 20  Temp 98.6 F (37 C)  Temp Source Oral  Oxygen Therapy  SpO2 100 %  O2 Device Nasal Cannula  O2 Flow Rate (L/min) 2 L/min

## 2021-01-21 NOTE — Assessment & Plan Note (Signed)
PRESENT ON ADMISSION

## 2021-01-22 ENCOUNTER — Inpatient Hospital Stay (HOSPITAL_COMMUNITY): Payer: Medicare Other

## 2021-01-22 DIAGNOSIS — N186 End stage renal disease: Secondary | ICD-10-CM

## 2021-01-22 DIAGNOSIS — Z89512 Acquired absence of left leg below knee: Secondary | ICD-10-CM | POA: Diagnosis not present

## 2021-01-22 DIAGNOSIS — Z992 Dependence on renal dialysis: Secondary | ICD-10-CM | POA: Diagnosis not present

## 2021-01-22 DIAGNOSIS — E1169 Type 2 diabetes mellitus with other specified complication: Secondary | ICD-10-CM

## 2021-01-22 DIAGNOSIS — R569 Unspecified convulsions: Secondary | ICD-10-CM | POA: Diagnosis not present

## 2021-01-22 DIAGNOSIS — Z89511 Acquired absence of right leg below knee: Secondary | ICD-10-CM

## 2021-01-22 LAB — GLUCOSE, CAPILLARY
Glucose-Capillary: 175 mg/dL — ABNORMAL HIGH (ref 70–99)
Glucose-Capillary: 69 mg/dL — ABNORMAL LOW (ref 70–99)
Glucose-Capillary: 72 mg/dL (ref 70–99)
Glucose-Capillary: 74 mg/dL (ref 70–99)
Glucose-Capillary: 87 mg/dL (ref 70–99)
Glucose-Capillary: 88 mg/dL (ref 70–99)
Glucose-Capillary: 94 mg/dL (ref 70–99)

## 2021-01-22 LAB — TROPONIN I (HIGH SENSITIVITY): Troponin I (High Sensitivity): 111 ng/L (ref <18)

## 2021-01-22 MED ORDER — LEVETIRACETAM IN NACL 500 MG/100ML IV SOLN
500.0000 mg | Freq: Once | INTRAVENOUS | Status: AC
  Administered 2021-01-22: 500 mg via INTRAVENOUS
  Filled 2021-01-22: qty 100

## 2021-01-22 MED ORDER — HYDRALAZINE HCL 20 MG/ML IJ SOLN
10.0000 mg | Freq: Once | INTRAMUSCULAR | Status: AC
  Administered 2021-01-22: 10 mg via INTRAVENOUS

## 2021-01-22 MED ORDER — DEXTROSE 50 % IV SOLN
12.5000 g | INTRAVENOUS | Status: AC
  Administered 2021-01-22: 12.5 g via INTRAVENOUS
  Filled 2021-01-22: qty 50

## 2021-01-22 MED ORDER — CARVEDILOL 12.5 MG PO TABS
12.5000 mg | ORAL_TABLET | ORAL | Status: DC
  Administered 2021-01-22 – 2021-01-23 (×3): 12.5 mg via ORAL
  Filled 2021-01-22 (×3): qty 1

## 2021-01-22 MED ORDER — CARVEDILOL 12.5 MG PO TABS: 12.5000 mg | ORAL_TABLET | ORAL | Status: DC

## 2021-01-22 MED ORDER — LEVETIRACETAM IN NACL 500 MG/100ML IV SOLN: 500.0000 mg | INTRAVENOUS | Status: DC

## 2021-01-22 MED ORDER — LEVETIRACETAM IN NACL 1000 MG/100ML IV SOLN
1000.0000 mg | INTRAVENOUS | Status: DC
  Administered 2021-01-23: 1000 mg via INTRAVENOUS
  Filled 2021-01-22: qty 100

## 2021-01-22 NOTE — Progress Notes (Signed)
Hypoglycemic Event  CBG: 69  Treatment: D50 25 mL (12.5 gm)  Symptoms: None  Follow-up CBG: Time: 0748 CBG Result:88  Possible Reasons for Event: Inadequate meal intake  Comments: Pt NPO; hypoglycemia protocol initiated and D50 12.'5mg'$  administered. Pt asymptomatic and resting in bed with call light within reach. Pt CBG rechecked and reported off to oncoming RN.    Nunzio Banet, Engineer, structural

## 2021-01-22 NOTE — Progress Notes (Signed)
Pt CBG was 75 and as pt is npo, MD on call notified and new order received. Pt CBG 175 after intervention and prn hydralazine effective. Will continue to closely monitor pt. Delia Heady RN

## 2021-01-22 NOTE — Progress Notes (Signed)
EEG completed, results pending. 

## 2021-01-22 NOTE — Progress Notes (Signed)
PROGRESS NOTE    SHIVAANSH ZHANG   [id number]  DOB: [birth year]  DOA: 01/21/2021 PCP: [name], FNP   Brief Narrative:  CLESTON MURIN is a 60 year old male with end-stage renal disease, anemia, diabetes with diabetic neuropathy, peripheral vascular disease and osteomyelitis of the left foot who presented to the hospital via EMS from dialysis with seizure-like activity was noted.  The patient was postictal on arrival to the ED.  Initial CT of the head was unrevealing.  Subjective: He does not remember going to dialysis yesterday and is not aware that he had seizure-like activity.  Other than pain in his hands, he has no complaints.  Assessment & Plan:   Principal Problem: Seizure-like activity -Patient was obtunded in the ED and suspected to be postictal -Keppra 500 twice daily  - EEG was ordered and not yet done -MRI brain has been ordered and is still pending -Appreciate neurology follow-up  Active Problems:   Anemia of chronic disease -Hemoglobin in 10-11 range  Mild troponin elevation -Troponin was 62 followed by 132 and then 111-no further work-up  Essential hypertension -The patient takes carvedilol, hydralazine and amlodipine as outpatient which have all been resumed    End stage renal disease on dialysis -The patient underwent a full dialysis on Friday and will not need dialysis again until Monday- renal team is aware that he is here and I suspect he may be discharged tomorrow    Type 2 diabetes mellitus neuropathy -I do not see that he is on any medications for diabetes as outpatient and therefore suspect that it is diet controlled -Continue Lyrica  Severe peripheral vascular disease -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   Difficult to place patient No      Consultants:   Neurology  Procedures:    Antimicrobials:  Anti-infectives (From admission, onward)   None       Objective: Vitals:   01/22/21 0008 01/22/21 0333 01/22/21 0602 01/22/21 0758  BP: (!) 165/92 (!) 186/91 (!) 162/88 (!) 179/76  Pulse: 83 79 81 69  Resp:    16  Temp: 98.4 F (36.9 C) 99.1 F (37.3 C) 98.9 F (37.2 C) 99 F (37.2 C)  TempSrc: Oral Oral Oral Oral  SpO2: 100% 100% 100% 97%  Weight:      Height:        Intake/Output Summary (Last 24 hours) at 01/22/2021 0806 Last data filed at 01/22/2021 0438 Gross per 24 hour  Intake 701.71 ml  Output --  Net 701.71 ml   Filed Weights   01/21/21 1437  Weight: 81.6 kg    Examination: General exam: Appears comfortable  HEENT: PERRLA, oral mucosa moist, no sclera icterus or thrush Respiratory system: Clear  to auscultation. Respiratory effort normal. Cardiovascular system: S1 & S2 heard, RRR.   Gastrointestinal system: Abdomen soft, non-tender, nondistended. Normal bowel sounds. Central nervous system: Alert and oriented. No focal neurological deficits. Extremities: No cyanosis, clubbing or edema-bilateral AKA and multiple amputations of fingers Skin: No rashes -has a small ulcer on his right stump which appears chronic Psychiatry: Flat affect, depressed mood    Data Reviewed: I have personally reviewed following labs and imaging studies  CBC: Recent Labs  Lab 01/21/21 1502  WBC 13.3*  NEUTROABS 10.8*  HGB 10.9*  HCT 35.1*  MCV 71.5*  PLT 0000000   Basic  Metabolic Panel: Recent Labs  Lab 01/21/21 1502 01/21/21 1947  NA 135  --   K 4.5  --   CL 92*  --   CO2 21*  --   GLUCOSE 145*  --   BUN 39*  --   CREATININE 7.19*  --   CALCIUM 7.6*  --   MG  --  2.0   GFR: Estimated Creatinine Clearance: 12.5 mL/min (A) (by C-G formula based on SCr of 7.19 mg/dL (H)). Liver Function Tests: Recent Labs  Lab 01/21/21 1502  AST 30  ALT 14  ALKPHOS 47  BILITOT 1.7*  PROT 8.2*  ALBUMIN 3.0*   Recent Labs  Lab 01/21/21 1502  LIPASE 33   No results for input(s): AMMONIA in the last 168 hours. Coagulation Profile: No results for input(s): INR, PROTIME in the last 168 hours. Cardiac Enzymes: No results for input(s): CKTOTAL, CKMB, CKMBINDEX, TROPONINI in the last 168 hours. BNP (last 3 results) No results for input(s): PROBNP in the last 8760 hours. HbA1C: Recent Labs    01/21/21 1502  HGBA1C 5.1   CBG: Recent Labs  Lab 01/21/21 2336 01/22/21 0025 01/22/21 0620 01/22/21 0747  GLUCAP 75 175* 69* 88   Lipid Profile: No results for input(s): CHOL, HDL, LDLCALC, TRIG, CHOLHDL, LDLDIRECT in the last 72 hours. Thyroid Function Tests: No results for input(s): TSH, T4TOTAL, FREET4, T3FREE, THYROIDAB in the last 72 hours. Anemia Panel: No results for input(s): VITAMINB12, FOLATE, FERRITIN, TIBC, IRON, RETICCTPCT in the last 72 hours. Urine analysis:    Component Value Date/Time   COLORURINE YELLOW 09/10/2018 1927   APPEARANCEUR HAZY (A) 09/10/2018 1927   LABSPEC 1.011 09/10/2018 1927   PHURINE 5.0 09/10/2018 1927   GLUCOSEU 50 (A) 09/10/2018 1927   HGBUR MODERATE (A) 09/10/2018 1927   BILIRUBINUR NEGATIVE 09/10/2018 1927   KETONESUR NEGATIVE 09/10/2018 1927   PROTEINUR 100 (A) 09/10/2018 1927   NITRITE NEGATIVE 09/10/2018 1927   LEUKOCYTESUR NEGATIVE 09/10/2018 1927   Sepsis Labs: '@LABRCNTIP'$ (procalcitonin:4,lacticidven:4) ) Recent Results (from the past 240 hour(s))  Resp Panel by RT-PCR (Flu A&B, Covid) Nasopharyngeal  Swab     Status: None   Collection Time: 01/21/21  3:39 PM   Specimen: Nasopharyngeal Swab; Nasopharyngeal(NP) swabs in vial transport medium  Result Value Ref Range Status   SARS Coronavirus 2 by RT PCR NEGATIVE NEGATIVE Final    Comment: (NOTE) SARS-CoV-2 target nucleic acids are NOT DETECTED.  The SARS-CoV-2 RNA is generally detectable in upper respiratory specimens during the acute phase of infection. The lowest concentration of SARS-CoV-2 viral copies this assay can detect is 138 copies/mL. A negative result does not preclude SARS-Cov-2 infection and should not be used as the sole basis for treatment or other patient management decisions. A negative result may occur with  improper specimen collection/handling, submission of specimen other than nasopharyngeal swab, presence of viral mutation(s)  within the areas targeted by this assay, and inadequate number of viral copies(<138 copies/mL). A negative result must be combined with clinical observations, patient history, and epidemiological information. The expected result is Negative.  Fact Sheet for Patients:  EntrepreneurPulse.com.au  Fact Sheet for Healthcare Providers:  IncredibleEmployment.be  This test is no t yet approved or cleared by the [place] FDA and  has been authorized for detection and/or diagnosis of SARS-CoV-2 by FDA under an Emergency Use Authorization (EUA). This EUA will remain  in effect (meaning this test can be used) for the duration of the COVID-19 declaration under Section 564(b)(1) of the Act, 21 U.S.C.section 360bbb-3(b)(1), unless the authorization is terminated  or revoked sooner.       Influenza A by PCR NEGATIVE NEGATIVE Final   Influenza B by PCR NEGATIVE NEGATIVE Final    Comment: (NOTE) The Xpert Xpress SARS-CoV-2/FLU/RSV plus assay is intended as an aid in the diagnosis of influenza from Nasopharyngeal swab specimens and should not be used as a sole  basis for treatment. Nasal washings and aspirates are unacceptable for Xpert Xpress SARS-CoV-2/FLU/RSV testing.  Fact Sheet for Patients: EntrepreneurPulse.com.au  Fact Sheet for Healthcare Providers: IncredibleEmployment.be  This test is not yet approved or cleared by the [place] FDA and has been authorized for detection and/or diagnosis of SARS-CoV-2 by FDA under an Emergency Use Authorization (EUA). This EUA will remain in effect (meaning this test can be used) for the duration of the COVID-19 declaration under Section 564(b)(1) of the Act, 21 U.S.C. section 360bbb-3(b)(1), unless the authorization is terminated or revoked.  Performed at [facility], [place] [address] [place], [place] [address]          Radiology Studies: CT Head Wo Contrast  Result Date: 01/21/2021 CLINICAL DATA:  Seizure today. EXAM: CT HEAD WITHOUT CONTRAST TECHNIQUE: Contiguous axial images were obtained from the base of the skull through the vertex without intravenous contrast. COMPARISON:  Head CT scan 11/24/2020. FINDINGS: Brain: No evidence of acute infarction, hemorrhage, hydrocephalus, extra-axial collection or mass lesion/mass effect. 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 Vascular: No hyperdense vessel or unexpected calcification. Skull: Normal. Negative for fracture or focal lesion. Sinuses/Orbits: Negative. Other: None. IMPRESSION: No acute abnormality.  Stable compared to prior exam. Electronically Signed   By: Inge Rise M.D.   On: 01/21/2021 15:29   DG KNEE 3 VIEW RIGHT  Result Date: 01/21/2021 CLINICAL DATA:  Pressure ulcer. EXAM: RIGHT KNEE - 3 VIEW COMPARISON:  None. FINDINGS: There is evidence of a right-sided below the knee amputation. No evidence of fracture, dislocation, or joint effusion. No evidence of arthropathy or other focal bone abnormality. Marked severity vascular calcification is seen. A small,  ill-defined anterior superficial soft tissue defect is seen adjacent to the inferior aspect of the right patella. IMPRESSION: 1. No acute osseous abnormality. 2. Status post right below the knee amputation. 3. Small superficial soft tissue defect adjacent to the inferior aspect of the right patella. Electronically Signed   By: Virgina Norfolk M.D.   On: 01/21/2021 20:51      Scheduled Meds: . amLODipine  10 mg Oral Daily  . atorvastatin  80 mg Oral QHS  . [START ON 01/24/2021] carvedilol  12.5 mg Oral Q M,W,F  . [START ON 01/24/2021] Darbepoetin Alfa  200 mcg Intravenous Q Mon-HD  . [START ON 01/24/2021] doxercalciferol  1.8 mcg Intravenous Q M,W,F-HD  . DULoxetine  30 mg Oral Daily  . famotidine  20  mg Oral QHS  . heparin  5,000 Units Subcutaneous Q8H  . hydrALAZINE  75 mg Oral Q8H  . insulin aspart  0-5 Units Subcutaneous QHS  . insulin aspart  0-6 Units Subcutaneous TID WC  . melatonin  3 mg Oral QHS  . oxyCODONE  10 mg Oral Q12H  . pregabalin  100 mg Oral QHS  . sevelamer carbonate  3,200 mg Oral TID WC   Continuous Infusions: . sodium chloride 75 mL/hr (01/22/21 0438)  . [START ON 01/23/2021] levETIRAcetam    . levETIRAcetam    . [START ON 01/24/2021] levETIRAcetam       LOS: 1 day      Debbe Odea, MD [facility] Pager: [email or web address] 01/22/2021, 8:06 AM

## 2021-01-22 NOTE — Plan of Care (Signed)

## 2021-01-22 NOTE — Procedures (Signed)
Patient Name: DARIAS DUQUE  MRN: [id number]  Epilepsy Attending: Lora Havens  Referring Physician/Provider: Dr Roanna Banning Date: 01/22/2021 Duration: 23.18 mins  Patient history: 60yo M with new onset seizures in the setting of dialysis. EEG to evaluate for seizure  Level of alertness: Awake  AEDs during EEG study: LEV  Technical aspects: This EEG study was done with scalp electrodes positioned according to the 10-20 International system of electrode placement. Electrical activity was acquired at a sampling rate of '500Hz'$  and reviewed with a high frequency filter of '70Hz'$  and a low frequency filter of '1Hz'$ . EEG data were recorded continuously and digitally stored.   Description: No posterior dominant rhythm was seen. EEG showed continuous generalized 5 to 6 Hz theta as well as intermittent 2-'3Hz'$  delta slowing. Intermittent generalized periodic discharges with triphasic morphology at 1-1.'5hz'$  were also noted. Hyperventilation and photic stimulation were not performed.     ABNORMALITY -Continuous slow, generalized -Periodic discharges with triphasic morphology, generalized  IMPRESSION: This study is suggestive of moderate diffuse encephalopathy, nonspecific etiology but likely related to toxic-metabolic causes. No seizures or definite epileptiform discharges were seen throughout the recording.  Athalene Kolle Barbra Sarks
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
## 2021-01-23 ENCOUNTER — Other Ambulatory Visit: Payer: Self-pay | Admitting: Internal Medicine

## 2021-01-23 DIAGNOSIS — N186 End stage renal disease: Secondary | ICD-10-CM | POA: Diagnosis not present

## 2021-01-23 DIAGNOSIS — L89899 Pressure ulcer of other site, unspecified stage: Secondary | ICD-10-CM

## 2021-01-23 DIAGNOSIS — D638 Anemia in other chronic diseases classified elsewhere: Secondary | ICD-10-CM

## 2021-01-23 DIAGNOSIS — Z992 Dependence on renal dialysis: Secondary | ICD-10-CM | POA: Diagnosis not present

## 2021-01-23 LAB — CBC
HCT: 33 % — ABNORMAL LOW (ref 39.0–52.0)
Hemoglobin: 10.1 g/dL — ABNORMAL LOW (ref 13.0–17.0)
MCH: 22.1 pg — ABNORMAL LOW (ref 26.0–34.0)
MCHC: 30.6 g/dL (ref 30.0–36.0)
MCV: 72.1 fL — ABNORMAL LOW (ref 80.0–100.0)
Platelets: 167 10*3/uL (ref 150–400)
RBC: 4.58 MIL/uL (ref 4.22–5.81)
RDW: 19.4 % — ABNORMAL HIGH (ref 11.5–15.5)
WBC: 8.5 10*3/uL (ref 4.0–10.5)
nRBC: 0 % (ref 0.0–0.2)

## 2021-01-23 LAB — GLUCOSE, CAPILLARY
Glucose-Capillary: 63 mg/dL — ABNORMAL LOW (ref 70–99)
Glucose-Capillary: 86 mg/dL (ref 70–99)
Glucose-Capillary: 59 mg/dL — ABNORMAL LOW (ref 70–99)
Glucose-Capillary: 114 mg/dL — ABNORMAL HIGH (ref 70–99)

## 2021-01-23 MED ORDER — DEXTROSE 50 % IV SOLN: 12.5000 g | INTRAVENOUS | Status: DC

## 2021-01-23 MED ORDER — LEVETIRACETAM 500 MG PO TABS: ORAL_TABLET | ORAL | 0 refills | Status: DC

## 2021-01-23 MED ORDER — LEVETIRACETAM 500 MG PO TABS: ORAL_TABLET | ORAL | 0 refills | Status: AC

## 2021-01-23 MED ORDER — DEXTROSE 50 % IV SOLN
INTRAVENOUS | Status: AC
  Administered 2021-01-23: 25 mL
  Filled 2021-01-23: qty 50

## 2021-01-23 NOTE — TOC Transition Note (Signed)
Transition of Care [facility]) - CM/SW Discharge Note   Patient Details  Name: [name] MRN: [id number] Date of Birth: 15-Feb-1961  Transition of Care [facility]) CM/SW Contact:  Pollie Friar, RN Phone Number: 01/23/2021, 3:17 PM   Clinical Narrative:    Pt is discharging home with self care. No needs per TOC. Pt has transport home.   Final next level of care: Home/Self Care Barriers to Discharge: No Barriers Identified   Patient Goals and CMS Choice        Discharge Placement                       Discharge Plan and Services                                     Social Determinants of Health (SDOH) Interventions     Readmission Risk Interventions Readmission Risk Prevention Plan 03/16/2020  Transportation Screening Complete  PCP or Specialist Appt within 3-5 Days (No Data)  [place] or [place] (No Data)  Medication Review (RN Care Manager) Complete  Some recent data might be hidden

## 2021-01-23 NOTE — Plan of Care (Signed)

## 2021-01-23 NOTE — Consult Note (Signed)
[place] Nurse Consult Note: Reason for Consult: Wound care guidance required for right knee full thickness wound while in house.   Note: Dr. Doran Durand (Orthopedics) referred patient to outpatient [facility] for this wound and Dr. Laurene Footman saw the patient on 01/18/21 for wound debridement. Wound type: Full thickness, recently debrided. Pressure Injury POA: N/A Measurement: Per Dr. Dellia Nims on 01/18/21:  0.8cm x 0.9cm x 0.4cm Wound bed: Red, moist Drainage (amount, consistency, odor) serous to serosanguinous Periwound:with evidence of previous wound healing, scarring Dressing procedure/placement/frequency: I will provide Nursing with conservative care orders for daily care while in house using soap and water to cleanse, rinse and pat dry followed by filling of dead space with a silver hydrofiber to both provide antimicrobial and wicking properties.  Patient to follow up with the physicians POC upon discharge and keep all follow up appointments-both with Dr. Doran Durand and Dr. Dellia Nims at the outpatient [facility].  [place] nursing team will not follow, but will remain available to this patient, the nursing and medical teams.  Please re-consult if needed. Thanks, Maudie Flakes, MSN, RN, [place], Arther Abbott  Pager# [phone]

## 2021-01-23 NOTE — Progress Notes (Signed)
Pt's IV removed; site is clean, dry and intact. Discharge instructions were reviewed with pt and spouse; voiced understanding. Personal belongings were packed by nursing staff and transported with pt. Pt. transported via wheelchair to private vehicle driven by wife.

## 2021-01-23 NOTE — Progress Notes (Signed)
Hypoglycemic Event  CBG:59  Treatment: 12.5g D50  Symptoms: none  Follow-up CBG: Time:114 CBG [id number]  Possible Reasons for Event: low po intake  Comments/MD notified:protocol executed   Amo Kuffour, [employer]

## 2021-01-23 NOTE — Plan of Care (Signed)
  Problem: Health Behavior/Discharge Planning: Goal: Ability to manage health-related needs will improve Outcome: Progressing   Problem: Clinical Measurements: Goal: Ability to maintain clinical measurements within normal limits will improve Outcome: Progressing Goal: Will remain free from infection Outcome: Progressing   Problem: Nutrition: Goal: Adequate nutrition will be maintained Outcome: Not Progressing

## 2021-01-23 NOTE — Discharge Summary (Signed)
Physician Discharge Summary  TORREN FALSETTI [id number] DOB: 11/05/1961 DOA: 01/21/2021  PCP: [name], FNP  Admit date: 01/21/2021 Discharge date: 01/23/2021  Admitted From: home Disposition:  home   Recommendations for Outpatient Follow-up:  1. Avoid medication which lower the seizure threshold   Home Health:  none  Discharge Condition:  stable   CODE STATUS:  Full code   Diet recommendation:  Renal, diabetic, heart healthy Consultations:  neurology  Procedures/Studies: . EEG   Discharge Diagnoses:  Principal Problem:   Seizure ([place]) Active Problems:   Anemia of chronic disease   S/P bilateral BKA (below knee amputation) ([place])   End stage renal disease on dialysis ([place])   Type 2 diabetes mellitus (H[facility])   HLD (hyperlipidemia)   PVD (peripheral vascular disease) ([facility])   GERD (gastroesophageal reflux disease)   Labile blood pressure   Post-ictal state (H[facility])   Pressure ulcer of right knee     Brief Summary: [name] is a 60 year old male with end-stage renal disease, anemia, diabetes with diabetic neuropathy, peripheral vascular disease and osteomyelitis of the left foot who presented to the hospital via EMS from dialysis with seizure-like activity was noted.  The patient was postictal on arrival to the ED.  Initial CT of the head was unrevealing.   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  Active Problems:   Anemia of chronic disease -Hemoglobin in 10-11 range  Mild troponin elevation -Troponin was 62 followed by 132  and then 111-no further work-up  Essential hypertension -The patient takes carvedilol, hydralazine and amlodipine as outpatient which have all been resumed    End stage renal disease on dialysis -The patient underwent a full dialysis on Friday and will not need dialysis again until Monday- renal team is aware that he is here and I suspect he may be discharged tomorrow    Type 2 diabetes mellitus neuropathy -I do not see that he is on any medications for diabetes as outpatient and therefore suspect that it is diet controlled -Continue Lyrica  Severe peripheral vascular disease -The patient has bilateral AKA's and multiple amputations of his fingers  Pressure ulcer of right knee -Continue local wound care   Discharge Exam: Vitals:   01/23/21 0806 01/23/21 1211  BP: (!) 158/72 (!) 153/67  Pulse: (!) 59 63  Resp: 16 18  Temp: 98.3 F (36.8 C) 98 F (36.7 C)  SpO2: 97% 93%   Vitals:   01/23/21 0500 01/23/21 0512 01/23/21 0806 01/23/21 1211  BP:  (!) 160/69 (!) 158/72 (!) 153/67  Pulse:  (!) 57 (!) 59 63  Resp: (!) '8 18 16 18  '$ Temp:  98.1 F (36.7 C) 98.3 F (36.8 C) 98 F (36.7 C)  TempSrc:  Oral  Oral  SpO2:  97% 97% 93%  Weight:      Height:        General: Pt is alert, awake, not in acute distress Cardiovascular: RRR, S1/S2 +, no rubs, no gallops Respiratory: CTA bilaterally, no wheezing, no rhonchi Abdominal: Soft, NT, ND, bowel sounds + Extremities: no edema, no cyanosis   Discharge  Instructions  Discharge Instructions    Increase activity slowly   Complete by: As directed    No wound care   Complete by: As directed      Allergies as of 01/23/2021   No Known Allergies     Medication List    TAKE these medications   acetaminophen 325 MG tablet Commonly known as: TYLENOL Take 2 tablets (650 mg total) by mouth 3 (three) times daily. What changed:   when to take this  reasons to take this   amLODipine 10 MG tablet Commonly known as:  NORVASC Take 1 tablet (10 mg total) by mouth daily.   atorvastatin 80 MG tablet Commonly known as: LIPITOR Take 1 tablet (80 mg total) by mouth at bedtime.   calcitRIOL 1 MCG/ML injection Commonly known as: CALCIJEX Inject 1.8 mLs (1.8 mcg total) into the vein every Monday, Wednesday, and Friday with hemodialysis.   calcium carbonate (dosed in mg elemental calcium) 1250 MG/5ML Susp Take 5 mLs (500 mg of elemental calcium total) by mouth every 6 (six) hours as needed for indigestion.   carvedilol 12.5 MG tablet Commonly known as: COREG Take 1 tablet (12.5 mg total) by mouth 2 (two) times daily with a meal. Take once on dialysis days What changed:   when to take this  additional instructions   Darbepoetin Alfa 200 MCG/0.4ML Sosy injection Commonly known as: ARANESP Inject 0.4 mLs (200 mcg total) into the vein every Monday with hemodialysis. What changed:   when to take this  additional instructions   DIALYVITE 800 WITH ZINC 0.8 MG Tabs Take 1 tablet by mouth every Monday, Wednesday, and Friday with hemodialysis.   docusate sodium 100 MG capsule Commonly known as: COLACE Take 1 capsule (100 mg total) by mouth 2 (two) times daily.   DULoxetine 30 MG capsule Commonly known as: CYMBALTA Take 1 capsule (30 mg total) by mouth daily.   famotidine 20 MG tablet Commonly known as: PEPCID Take 20 mg by mouth 2 (two) times daily.   ferrous sulfate 325 (65 FE) MG tablet Take 1 tablet (325 mg total) by mouth daily with breakfast.   hydrALAZINE 25 MG tablet Commonly known as: APRESOLINE Take 3 tablets (75 mg total) by mouth every 8 (eight) hours.   ibuprofen 800 MG tablet Commonly known as: ADVIL Take 800 mg by mouth every 6 (six) hours as needed for fever or mild pain.   levETIRAcetam 500 MG tablet Commonly known as: Keppra Take 1000 mg daily and an extra 500 mg on dialysis days   melatonin 3 MG Tabs tablet Take 1 tablet (3 mg total) by mouth at bedtime.   melatonin 1  MG Tabs tablet Take by mouth.   MIRCERA IJ Mircera   multivitamin Tabs tablet Take 1 tablet by mouth every Monday, Wednesday, and Friday with hemodialysis.   oxyCODONE 10 mg 12 hr tablet Commonly known as: OXYCONTIN Take 1 tablet (10 mg total) by mouth every 12 (twelve) hours.   Oxycodone HCl 10 MG Tabs Take 1 tablet (10 mg total) by mouth every 4 (four) hours as needed for moderate pain or severe pain.   oxyCODONE 10 mg 12 hr tablet Commonly known as: OXYCONTIN Take by mouth.   oxyCODONE-acetaminophen 5-325 MG tablet Commonly known as: PERCOCET/ROXICET Take by mouth.   polyethylene glycol 17 g packet Commonly known as: MIRALAX / GLYCOLAX Take 17 g by mouth 2 (two) times daily.   pregabalin 100 MG capsule Commonly known as: LYRICA Take 1 capsule (100  mg total) by mouth at bedtime.   senna-docusate 8.6-50 MG tablet Commonly known as: Senokot-S Take 1 tablet by mouth 2 (two) times daily as needed for moderate constipation.   sevelamer carbonate 800 MG tablet Commonly known as: RENVELA Take 4 tablets (3,200 mg total) by mouth 3 (three) times daily with meals.       No Known Allergies    EEG  Result Date: 01/22/2021 Lora Havens, MD     01/22/2021  1:48 PM Patient Name: [name] MRN: [id number] Epilepsy Attending: Lora Havens Referring Physician/Provider: Dr Roanna Banning Date: 01/22/2021 Duration: 23.18 mins Patient history: 60yo M with new onset seizures in the setting of dialysis. EEG to evaluate for seizure Level of alertness: Awake AEDs during EEG study: LEV Technical aspects: This EEG study was done with scalp electrodes positioned according to the 10-20 International system of electrode placement. Electrical activity was acquired at a sampling rate of '500Hz'$  and reviewed with a high frequency filter of '70Hz'$  and a low frequency filter of '1Hz'$ . EEG data were recorded continuously and digitally stored. Description: No posterior dominant rhythm was seen. EEG  showed continuous generalized 5 to 6 Hz theta as well as intermittent 2-'3Hz'$  delta slowing. Intermittent generalized periodic discharges with triphasic morphology at 1-1.'5hz'$  were also noted. Hyperventilation and photic stimulation were not performed.   ABNORMALITY -Continuous slow, generalized -Periodic discharges with triphasic morphology, generalized IMPRESSION: This study is suggestive of moderate diffuse encephalopathy, nonspecific etiology but likely related to toxic-metabolic causes. No seizures or definite epileptiform discharges were seen throughout the recording. Lora Havens   CT Head Wo Contrast  Result Date: 01/21/2021 CLINICAL DATA:  Seizure today. EXAM: CT HEAD WITHOUT CONTRAST TECHNIQUE: Contiguous axial images were obtained from the base of the skull through the vertex without intravenous contrast. COMPARISON:  Head CT scan 11/24/2020. FINDINGS: Brain: No evidence of acute infarction, hemorrhage, hydrocephalus, extra-axial collection or mass lesion/mass effect. 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 Vascular: No hyperdense vessel or unexpected calcification. Skull: Normal. Negative for fracture or focal lesion. Sinuses/Orbits: Negative. Other: None. IMPRESSION: No acute abnormality.  Stable compared to prior exam. Electronically Signed   By: Inge Rise M.D.   On: 01/21/2021 15:29   MR BRAIN WO CONTRAST  Result Date: 01/22/2021 CLINICAL DATA:  New onset seizure activity at dialysis. EXAM: MRI HEAD WITHOUT CONTRAST TECHNIQUE: Multiplanar, multiecho pulse sequences of the brain and surrounding structures were obtained without intravenous contrast. COMPARISON:  Head CT 01/21/2021 FINDINGS: Brain: No acute infarct, mass, midline shift, or extra-axial fluid collection is identified. There is a single chronic microhemorrhage in the right occipital lobe. T2 hyperintensities in the cerebral white matter bilaterally are nonspecific but compatible with mild  chronic small vessel ischemic disease. Small chronic infarcts are again noted in the anterior parasagittal right frontal lobe and left cerebellum a. there is mild cerebral atrophy with asymmetric ex vacuo dilatation of the atrium and occipital horn of the right lateral ventricle. Dedicated temporal lobe imaging is motion degraded, however the hippocampi are grossly symmetric in size and signal. Vascular: Abnormal appearance of the distal left vertebral artery. Other major intracranial vascular flow voids are preserved. Skull and upper cervical spine: Unremarkable bone marrow signal para Sinuses/Orbits: Unremarkable orbits. Minimal mucosal thickening in the paranasal sinuses. Clear mastoid air cells. Other: None. IMPRESSION: 1. No acute intracranial abnormality. 2. Mild chronic small vessel ischemic disease and cerebral atrophy. 3. 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 4. Abnormal  appearance of the distal left vertebral artery which may reflect occlusion or slow flow. Electronically Signed   By: Logan Bores M.D.   On: 01/22/2021 16:57   DG KNEE 3 VIEW RIGHT  Result Date: 01/21/2021 CLINICAL DATA:  Pressure ulcer. EXAM: RIGHT KNEE - 3 VIEW COMPARISON:  None. FINDINGS: There is evidence of a right-sided below the knee amputation. No evidence of fracture, dislocation, or joint effusion. No evidence of arthropathy or other focal bone abnormality. Marked severity vascular calcification is seen. A small, ill-defined anterior superficial soft tissue defect is seen adjacent to the inferior aspect of the right patella. IMPRESSION: 1. No acute osseous abnormality. 2. Status post right below the knee amputation. 3. Small superficial soft tissue defect adjacent to the inferior aspect of the right patella. Electronically Signed   By: Virgina Norfolk M.D.   On: 01/21/2021 20:51     The results of significant diagnostics from this hospitalization (including imaging, microbiology, ancillary and  laboratory) are listed below for reference.     Microbiology: Recent Results (from the past 240 hour(s))  Resp Panel by RT-PCR (Flu A&B, Covid) Nasopharyngeal Swab     Status: None   Collection Time: 01/21/21  3:39 PM   Specimen: Nasopharyngeal Swab; Nasopharyngeal(NP) swabs in vial transport medium  Result Value Ref Range Status   SARS Coronavirus 2 by RT PCR NEGATIVE NEGATIVE Final    Comment: (NOTE) SARS-CoV-2 target nucleic acids are NOT DETECTED.  The SARS-CoV-2 RNA is generally detectable in upper respiratory specimens during the acute phase of infection. The lowest concentration of SARS-CoV-2 viral copies this assay can detect is 138 copies/mL. A negative result does not preclude SARS-Cov-2 infection and should not be used as the sole basis for treatment or other patient management decisions. A negative result may occur with  improper specimen collection/handling, submission of specimen other than nasopharyngeal swab, presence of viral mutation(s) within the areas targeted by this assay, and inadequate number of viral copies(<138 copies/mL). A negative result must be combined with clinical observations, patient history, and epidemiological information. The expected result is Negative.  Fact Sheet for Patients:  EntrepreneurPulse.com.au  Fact Sheet for Healthcare Providers:  IncredibleEmployment.be  This test is no t yet approved or cleared by the [place] FDA and  has been authorized for detection and/or diagnosis of SARS-CoV-2 by FDA under an Emergency Use Authorization (EUA). This EUA will remain  in effect (meaning this test can be used) for the duration of the COVID-19 declaration under Section 564(b)(1) of the Act, 21 U.S.C.section 360bbb-3(b)(1), unless the authorization is terminated  or revoked sooner.       Influenza A by PCR NEGATIVE NEGATIVE Final   Influenza B by PCR NEGATIVE NEGATIVE Final    Comment: (NOTE) The  Xpert Xpress SARS-CoV-2/FLU/RSV plus assay is intended as an aid in the diagnosis of influenza from Nasopharyngeal swab specimens and should not be used as a sole basis for treatment. Nasal washings and aspirates are unacceptable for Xpert Xpress SARS-CoV-2/FLU/RSV testing.  Fact Sheet for Patients: EntrepreneurPulse.com.au  Fact Sheet for Healthcare Providers: IncredibleEmployment.be  This test is not yet approved or cleared by the [place] FDA and has been authorized for detection and/or diagnosis of SARS-CoV-2 by FDA under an Emergency Use Authorization (EUA). This EUA will remain in effect (meaning this test can be used) for the duration of the COVID-19 declaration under Section 564(b)(1) of the Act, 21 U.S.C. section 360bbb-3(b)(1), unless the authorization is terminated or revoked.  Performed at [facility]  [facility], [place] [address] [place], [place] [address]      Labs: BNP (last 3 results) Recent Labs    03/14/20 0812 03/15/20 0438 03/16/20 0436  BNP 344.0* 246.0* [id number]*   Basic Metabolic Panel: Recent Labs  Lab 01/21/21 1502 01/21/21 1947  NA 135  --   K 4.5  --   CL 92*  --   CO2 21*  --   GLUCOSE 145*  --   BUN 39*  --   CREATININE 7.19*  --   CALCIUM 7.6*  --   MG  --  2.0   Liver Function Tests: Recent Labs  Lab 01/21/21 1502  AST 30  ALT 14  ALKPHOS 47  BILITOT 1.7*  PROT 8.2*  ALBUMIN 3.0*   Recent Labs  Lab 01/21/21 1502  LIPASE 33   No results for input(s): AMMONIA in the last 168 hours. CBC: Recent Labs  Lab 01/21/21 1502 01/23/21 0459  WBC 13.3* 8.5  NEUTROABS 10.8*  --   HGB 10.9* 10.1*  HCT 35.1* 33.0*  MCV 71.5* 72.1*  PLT 153 167   Cardiac Enzymes: No results for input(s): CKTOTAL, CKMB, CKMBINDEX, TROPONINI in the last 168 hours. BNP: Invalid input(s): POCBNP CBG: Recent Labs  Lab 01/22/21 2154 01/23/21 0220 01/23/21 0247 01/23/21 0602 01/23/21 0630  GLUCAP 94 63* 86  59* 114*   D-Dimer No results for input(s): DDIMER in the last 72 hours. Hgb A1c Recent Labs    01/21/21 1502  HGBA1C 5.1   Lipid Profile No results for input(s): CHOL, HDL, LDLCALC, TRIG, CHOLHDL, LDLDIRECT in the last 72 hours. Thyroid function studies No results for input(s): TSH, T4TOTAL, T3FREE, THYROIDAB in the last 72 hours.  Invalid input(s): FREET3 Anemia work up No results for input(s): VITAMINB12, FOLATE, FERRITIN, TIBC, IRON, RETICCTPCT in the last 72 hours. Urinalysis    Component Value Date/Time   COLORURINE YELLOW 09/10/2018 1927   APPEARANCEUR HAZY (A) 09/10/2018 1927   LABSPEC 1.011 09/10/2018 1927   PHURINE 5.0 09/10/2018 1927   GLUCOSEU 50 (A) 09/10/2018 1927   HGBUR MODERATE (A) 09/10/2018 1927   BILIRUBINUR NEGATIVE 09/10/2018 1927   KETONESUR NEGATIVE 09/10/2018 1927   PROTEINUR 100 (A) 09/10/2018 1927   NITRITE NEGATIVE 09/10/2018 1927   LEUKOCYTESUR NEGATIVE 09/10/2018 1927   Sepsis Labs Invalid input(s): PROCALCITONIN,  WBC,  LACTICIDVEN Microbiology Recent Results (from the past 240 hour(s))  Resp Panel by RT-PCR (Flu A&B, Covid) Nasopharyngeal Swab     Status: None   Collection Time: 01/21/21  3:39 PM   Specimen: Nasopharyngeal Swab; Nasopharyngeal(NP) swabs in vial transport medium  Result Value Ref Range Status   SARS Coronavirus 2 by RT PCR NEGATIVE NEGATIVE Final    Comment: (NOTE) SARS-CoV-2 target nucleic acids are NOT DETECTED.  The SARS-CoV-2 RNA is generally detectable in upper respiratory specimens during the acute phase of infection. The lowest concentration of SARS-CoV-2 viral copies this assay can detect is 138 copies/mL. A negative result does not preclude SARS-Cov-2 infection and should not be used as the sole basis for treatment or other patient management decisions. A negative result may occur with  improper specimen collection/handling, submission of specimen other than nasopharyngeal swab, presence of viral mutation(s)  within the areas targeted by this assay, and inadequate number of viral copies(<138 copies/mL). A negative result must be combined with clinical observations, patient history, and epidemiological information. The expected result is Negative.  Fact Sheet for Patients:  EntrepreneurPulse.com.au  Fact Sheet for Healthcare Providers:  IncredibleEmployment.be  This test is no t yet approved or cleared by the [place] and  has been authorized for detection and/or diagnosis of SARS-CoV-2 by FDA under an Emergency Use Authorization (EUA). This EUA will remain  in effect (meaning this test can be used) for the duration of the COVID-19 declaration under Section 564(b)(1) of the Act, 21 U.S.C.section 360bbb-3(b)(1), unless the authorization is terminated  or revoked sooner.       Influenza A by PCR NEGATIVE NEGATIVE Final   Influenza B by PCR NEGATIVE NEGATIVE Final    Comment: (NOTE) The Xpert Xpress SARS-CoV-2/FLU/RSV plus assay is intended as an aid in the diagnosis of influenza from Nasopharyngeal swab specimens and should not be used as a sole basis for treatment. Nasal washings and aspirates are unacceptable for Xpert Xpress SARS-CoV-2/FLU/RSV testing.  Fact Sheet for Patients: EntrepreneurPulse.com.au  Fact Sheet for Healthcare Providers: IncredibleEmployment.be  This test is not yet approved or cleared by the [place] FDA and has been authorized for detection and/or diagnosis of SARS-CoV-2 by FDA under an Emergency Use Authorization (EUA). This EUA will remain in effect (meaning this test can be used) for the duration of the COVID-19 declaration under Section 564(b)(1) of the Act, 21 U.S.C. section 360bbb-3(b)(1), unless the authorization is terminated or revoked.  Performed at [facility], [place] [address], [place],  [address]      Time coordinating discharge in minutes:  65  SIGNED:   Debbe Odea, MD  [facility] 01/23/2021, 1:39 PM

## 2021-01-23 NOTE — Progress Notes (Signed)
Hypoglycemic Event  CBG:63  Treatment: 4oz orange juice  Symptoms: none  Follow-up CBG: Time:0247 CBG Result: 86  Possible Reasons for Event: low Po intake  Comments/MD notified:protocol executed    Amo Kuffour, [employer]

## 2021-01-24 ENCOUNTER — Telehealth: Payer: Self-pay | Admitting: Nephrology

## 2021-01-24 LAB — GLUCOSE, CAPILLARY: Glucose-Capillary: 101 mg/dL — ABNORMAL HIGH (ref 70–99)

## 2021-01-24 NOTE — Telephone Encounter (Signed)
Transition of care contact from inpatient facility  Date of Discharge: 01/23/21 Date of Contact: 01/24/21 Method of contact: Phone -attempt   Attempted to contact patient to discuss transition of care from inpatient admission. Patient did not answer the phone. Message was left on the patient's voicemail with call back number [phone].

## 2021-01-25 ENCOUNTER — Encounter (HOSPITAL_BASED_OUTPATIENT_CLINIC_OR_DEPARTMENT_OTHER): Payer: Medicare Other | Admitting: Internal Medicine

## 2021-02-08 ENCOUNTER — Other Ambulatory Visit: Payer: Self-pay | Admitting: Internal Medicine

## 2021-02-08 DIAGNOSIS — L089 Local infection of the skin and subcutaneous tissue, unspecified: Secondary | ICD-10-CM

## 2021-02-15 ENCOUNTER — Encounter (HOSPITAL_COMMUNITY): Payer: Self-pay | Admitting: Internal Medicine

## 2021-02-15 ENCOUNTER — Other Ambulatory Visit: Payer: Self-pay

## 2021-02-15 ENCOUNTER — Inpatient Hospital Stay (HOSPITAL_COMMUNITY): Payer: Medicare Other

## 2021-02-15 ENCOUNTER — Other Ambulatory Visit: Payer: Self-pay | Admitting: Orthopedic Surgery

## 2021-02-15 ENCOUNTER — Inpatient Hospital Stay (HOSPITAL_COMMUNITY)
Admission: AD | Admit: 2021-02-15 | Discharge: 2021-02-18 | DRG: 255 | Disposition: A | Discharge status: Home / Self Care | Payer: Medicare Other | Source: Ambulatory Visit | Attending: Family Medicine | Admitting: Family Medicine

## 2021-02-15 DIAGNOSIS — I160 Hypertensive urgency: Secondary | ICD-10-CM | POA: Diagnosis present

## 2021-02-15 DIAGNOSIS — E119 Type 2 diabetes mellitus without complications: Secondary | ICD-10-CM

## 2021-02-15 DIAGNOSIS — D638 Anemia in other chronic diseases classified elsewhere: Secondary | ICD-10-CM

## 2021-02-15 DIAGNOSIS — K219 Gastro-esophageal reflux disease without esophagitis: Secondary | ICD-10-CM | POA: Diagnosis present

## 2021-02-15 DIAGNOSIS — N2581 Secondary hyperparathyroidism of renal origin: Secondary | ICD-10-CM | POA: Diagnosis present

## 2021-02-15 DIAGNOSIS — G5601 Carpal tunnel syndrome, right upper limb: Secondary | ICD-10-CM | POA: Diagnosis present

## 2021-02-15 DIAGNOSIS — N186 End stage renal disease: Secondary | ICD-10-CM | POA: Diagnosis present

## 2021-02-15 DIAGNOSIS — E785 Hyperlipidemia, unspecified: Secondary | ICD-10-CM | POA: Diagnosis present

## 2021-02-15 DIAGNOSIS — Z89511 Acquired absence of right leg below knee: Secondary | ICD-10-CM

## 2021-02-15 DIAGNOSIS — D631 Anemia in chronic kidney disease: Secondary | ICD-10-CM | POA: Diagnosis present

## 2021-02-15 DIAGNOSIS — G8929 Other chronic pain: Secondary | ICD-10-CM | POA: Diagnosis present

## 2021-02-15 DIAGNOSIS — E1122 Type 2 diabetes mellitus with diabetic chronic kidney disease: Secondary | ICD-10-CM | POA: Diagnosis present

## 2021-02-15 DIAGNOSIS — M7989 Other specified soft tissue disorders: Secondary | ICD-10-CM | POA: Diagnosis present

## 2021-02-15 DIAGNOSIS — E876 Hypokalemia: Secondary | ICD-10-CM | POA: Diagnosis not present

## 2021-02-15 DIAGNOSIS — Z8249 Family history of ischemic heart disease and other diseases of the circulatory system: Secondary | ICD-10-CM | POA: Diagnosis not present

## 2021-02-15 DIAGNOSIS — E1152 Type 2 diabetes mellitus with diabetic peripheral angiopathy with gangrene: Secondary | ICD-10-CM | POA: Diagnosis present

## 2021-02-15 DIAGNOSIS — Z20822 Contact with and (suspected) exposure to covid-19: Secondary | ICD-10-CM | POA: Diagnosis present

## 2021-02-15 DIAGNOSIS — Z87891 Personal history of nicotine dependence: Secondary | ICD-10-CM

## 2021-02-15 DIAGNOSIS — G40909 Epilepsy, unspecified, not intractable, without status epilepticus: Secondary | ICD-10-CM | POA: Diagnosis present

## 2021-02-15 DIAGNOSIS — Z89512 Acquired absence of left leg below knee: Secondary | ICD-10-CM | POA: Diagnosis not present

## 2021-02-15 DIAGNOSIS — I96 Gangrene, not elsewhere classified: Secondary | ICD-10-CM | POA: Diagnosis not present

## 2021-02-15 DIAGNOSIS — I120 Hypertensive chronic kidney disease with stage 5 chronic kidney disease or end stage renal disease: Secondary | ICD-10-CM | POA: Diagnosis present

## 2021-02-15 DIAGNOSIS — E1140 Type 2 diabetes mellitus with diabetic neuropathy, unspecified: Secondary | ICD-10-CM | POA: Diagnosis present

## 2021-02-15 DIAGNOSIS — E1169 Type 2 diabetes mellitus with other specified complication: Secondary | ICD-10-CM | POA: Diagnosis not present

## 2021-02-15 DIAGNOSIS — Z992 Dependence on renal dialysis: Secondary | ICD-10-CM | POA: Diagnosis not present

## 2021-02-15 DIAGNOSIS — Z833 Family history of diabetes mellitus: Secondary | ICD-10-CM

## 2021-02-15 LAB — COMPREHENSIVE METABOLIC PANEL
ALT: 10 U/L (ref 0–44)
AST: 15 U/L (ref 15–41)
Albumin: 2.9 g/dL — ABNORMAL LOW (ref 3.5–5.0)
Alkaline Phosphatase: 62 U/L (ref 38–126)
Anion gap: 15 (ref 5–15)
BUN: 40 mg/dL — ABNORMAL HIGH (ref 8–23)
CO2: 27 mmol/L (ref 22–32)
Calcium: 9.3 mg/dL (ref 8.9–10.3)
Chloride: 94 mmol/L — ABNORMAL LOW (ref 98–111)
Creatinine, Ser: 8.27 mg/dL — ABNORMAL HIGH (ref 0.61–1.24)
GFR, Estimated: 7 mL/min — ABNORMAL LOW (ref >60)
Glucose, Bld: 92 mg/dL (ref 70–99)
Potassium: 3 mmol/L — ABNORMAL LOW (ref 3.5–5.1)
Sodium: 136 mmol/L (ref 135–145)
Total Bilirubin: 1 mg/dL (ref 0.3–1.2)
Total Protein: 8 g/dL (ref 6.5–8.1)

## 2021-02-15 LAB — GLUCOSE, CAPILLARY
Glucose-Capillary: 92 mg/dL (ref 70–99)
Glucose-Capillary: 90 mg/dL (ref 70–99)

## 2021-02-15 LAB — CBC WITH DIFFERENTIAL/PLATELET
Abs Immature Granulocytes: 0.05 10*3/uL (ref 0.00–0.07)
Basophils Absolute: 0.1 10*3/uL (ref 0.0–0.1)
Basophils Relative: 0 %
Eosinophils Absolute: 0.1 10*3/uL (ref 0.0–0.5)
Eosinophils Relative: 1 %
HCT: 36.3 % — ABNORMAL LOW (ref 39.0–52.0)
Hemoglobin: 11.4 g/dL — ABNORMAL LOW (ref 13.0–17.0)
Immature Granulocytes: 0 %
Lymphocytes Relative: 9 %
Lymphs Abs: 1.2 10*3/uL (ref 0.7–4.0)
MCH: 22.2 pg — ABNORMAL LOW (ref 26.0–34.0)
MCHC: 31.4 g/dL (ref 30.0–36.0)
MCV: 70.8 fL — ABNORMAL LOW (ref 80.0–100.0)
Monocytes Absolute: 1.5 10*3/uL — ABNORMAL HIGH (ref 0.1–1.0)
Monocytes Relative: 11 %
Neutro Abs: 10.7 10*3/uL — ABNORMAL HIGH (ref 1.7–7.7)
Neutrophils Relative %: 79 %
Platelets: 250 10*3/uL (ref 150–400)
RBC: 5.13 MIL/uL (ref 4.22–5.81)
RDW: 19.5 % — ABNORMAL HIGH (ref 11.5–15.5)
WBC: 13.5 10*3/uL — ABNORMAL HIGH (ref 4.0–10.5)
nRBC: 0 % (ref 0.0–0.2)

## 2021-02-15 LAB — TYPE AND SCREEN
ABO/RH(D): O POS
Antibody Screen: NEGATIVE

## 2021-02-15 LAB — SEDIMENTATION RATE: Sed Rate: 73 mm/hr — ABNORMAL HIGH (ref 0–16)

## 2021-02-15 LAB — SURGICAL PCR SCREEN
MRSA, PCR: NEGATIVE
Staphylococcus aureus: NEGATIVE

## 2021-02-15 LAB — SARS CORONAVIRUS 2 (TAT 6-24 HRS): SARS Coronavirus 2: NEGATIVE

## 2021-02-15 MED ORDER — LIDOCAINE-PRILOCAINE 2.5-2.5 % EX CREA: TOPICAL_CREAM | Freq: Once | CUTANEOUS | Status: DC

## 2021-02-15 MED ORDER — DIALYVITE 800/ZINC 0.8 MG PO TABS: 1.0000 | ORAL_TABLET | ORAL | Status: DC

## 2021-02-15 MED ORDER — VANCOMYCIN HCL 1750 MG/350ML IV SOLN
1750.0000 mg | Freq: Once | INTRAVENOUS | Status: AC
  Administered 2021-02-15: 1750 mg via INTRAVENOUS
  Filled 2021-02-15: qty 350

## 2021-02-15 MED ORDER — RENA-VITE PO TABS
1.0000 | ORAL_TABLET | ORAL | Status: DC
  Administered 2021-02-18: 1 via ORAL
  Filled 2021-02-15 (×2): qty 1

## 2021-02-15 MED ORDER — AMLODIPINE BESYLATE 10 MG PO TABS
10.0000 mg | ORAL_TABLET | Freq: Every day | ORAL | Status: DC
  Administered 2021-02-15 – 2021-02-18 (×3): 10 mg via ORAL
  Filled 2021-02-15 (×3): qty 1

## 2021-02-15 MED ORDER — ACETAMINOPHEN 325 MG PO TABS: 650.0000 mg | ORAL_TABLET | Freq: Four times a day (QID) | ORAL | Status: DC | PRN

## 2021-02-15 MED ORDER — CLOPIDOGREL BISULFATE 75 MG PO TABS
75.0000 mg | ORAL_TABLET | Freq: Every day | ORAL | Status: DC
  Administered 2021-02-17 – 2021-02-18 (×3): 75 mg via ORAL
  Filled 2021-02-15 (×3): qty 1

## 2021-02-15 MED ORDER — LEVETIRACETAM 500 MG PO TABS
1000.0000 mg | ORAL_TABLET | Freq: Every day | ORAL | Status: DC
  Administered 2021-02-15 – 2021-02-18 (×4): 1000 mg via ORAL
  Filled 2021-02-15 (×4): qty 2

## 2021-02-15 MED ORDER — SEVELAMER CARBONATE 800 MG PO TABS
3200.0000 mg | ORAL_TABLET | Freq: Three times a day (TID) | ORAL | Status: DC
  Administered 2021-02-17 – 2021-02-18 (×4): 3200 mg via ORAL
  Filled 2021-02-15 (×4): qty 4

## 2021-02-15 MED ORDER — SENNOSIDES-DOCUSATE SODIUM 8.6-50 MG PO TABS: 1.0000 | ORAL_TABLET | Freq: Two times a day (BID) | ORAL | Status: DC | PRN

## 2021-02-15 MED ORDER — MELATONIN 3 MG PO TABS
3.0000 mg | ORAL_TABLET | Freq: Every evening | ORAL | Status: DC | PRN
  Administered 2021-02-15 – 2021-02-17 (×3): 3 mg via ORAL
  Filled 2021-02-15 (×3): qty 1

## 2021-02-15 MED ORDER — LEVETIRACETAM 500 MG PO TABS
500.0000 mg | ORAL_TABLET | ORAL | Status: DC
  Administered 2021-02-18: 500 mg via ORAL
  Filled 2021-02-15: qty 1

## 2021-02-15 MED ORDER — SODIUM CHLORIDE 0.9% FLUSH
3.0000 mL | Freq: Two times a day (BID) | INTRAVENOUS | Status: DC
  Administered 2021-02-15 – 2021-02-18 (×4): 3 mL via INTRAVENOUS

## 2021-02-15 MED ORDER — ATORVASTATIN CALCIUM 80 MG PO TABS
80.0000 mg | ORAL_TABLET | ORAL | Status: DC
  Administered 2021-02-15: 80 mg via ORAL
  Filled 2021-02-15: qty 1

## 2021-02-15 MED ORDER — HEPARIN SODIUM (PORCINE) 5000 UNIT/ML IJ SOLN
5000.0000 [IU] | Freq: Three times a day (TID) | INTRAMUSCULAR | Status: DC
  Administered 2021-02-17 (×3): 5000 [IU] via SUBCUTANEOUS
  Filled 2021-02-15 (×5): qty 1

## 2021-02-15 MED ORDER — PIPERACILLIN-TAZOBACTAM 3.375 G IVPB 30 MIN: 3.3750 g | Freq: Three times a day (TID) | INTRAVENOUS | Status: DC

## 2021-02-15 MED ORDER — PIPERACILLIN-TAZOBACTAM IN DEX 2-0.25 GM/50ML IV SOLN
2.2500 g | Freq: Three times a day (TID) | INTRAVENOUS | Status: DC
  Administered 2021-02-15 – 2021-02-18 (×7): 2.25 g via INTRAVENOUS
  Filled 2021-02-15 (×9): qty 50

## 2021-02-15 MED ORDER — FAMOTIDINE 20 MG PO TABS: 20.0000 mg | ORAL_TABLET | Freq: Two times a day (BID) | ORAL | Status: DC | PRN

## 2021-02-15 MED ORDER — CALCITRIOL 1 MCG/ML IV SOLN: 1.7500 ug | INTRAVENOUS | Status: DC

## 2021-02-15 MED ORDER — ACETAMINOPHEN 650 MG RE SUPP: 650.0000 mg | Freq: Four times a day (QID) | RECTAL | Status: DC | PRN

## 2021-02-15 MED ORDER — ALBUTEROL SULFATE (2.5 MG/3ML) 0.083% IN NEBU: 2.5000 mg | INHALATION_SOLUTION | Freq: Four times a day (QID) | RESPIRATORY_TRACT | Status: DC | PRN

## 2021-02-15 MED ORDER — CHLORHEXIDINE GLUCONATE CLOTH 2 % EX PADS
6.0000 | MEDICATED_PAD | Freq: Every day | CUTANEOUS | Status: DC
  Administered 2021-02-16: 6 via TOPICAL

## 2021-02-15 MED ORDER — FERROUS SULFATE 325 (65 FE) MG PO TABS
325.0000 mg | ORAL_TABLET | Freq: Every day | ORAL | Status: DC
  Administered 2021-02-17 – 2021-02-18 (×2): 325 mg via ORAL
  Filled 2021-02-15 (×4): qty 1

## 2021-02-15 MED ORDER — VANCOMYCIN HCL IN DEXTROSE 750-5 MG/150ML-% IV SOLN
750.0000 mg | INTRAVENOUS | Status: DC
  Filled 2021-02-15 (×2): qty 150

## 2021-02-15 MED ORDER — DULOXETINE HCL 30 MG PO CPEP
30.0000 mg | ORAL_CAPSULE | Freq: Every day | ORAL | Status: DC
  Administered 2021-02-17 – 2021-02-18 (×3): 30 mg via ORAL
  Filled 2021-02-15 (×3): qty 1

## 2021-02-15 MED ORDER — CALCITRIOL 0.5 MCG PO CAPS
2.5000 ug | ORAL_CAPSULE | ORAL | Status: DC
  Administered 2021-02-17: 2.5 ug via ORAL
  Filled 2021-02-15: qty 5

## 2021-02-15 MED ORDER — CARVEDILOL 12.5 MG PO TABS
12.5000 mg | ORAL_TABLET | ORAL | Status: DC
  Administered 2021-02-15 – 2021-02-17 (×3): 12.5 mg via ORAL
  Filled 2021-02-15 (×3): qty 1

## 2021-02-15 MED ORDER — PREGABALIN 100 MG PO CAPS
100.0000 mg | ORAL_CAPSULE | Freq: Every evening | ORAL | Status: DC | PRN
  Administered 2021-02-15 – 2021-02-17 (×3): 100 mg via ORAL
  Filled 2021-02-15 (×3): qty 1

## 2021-02-15 MED ORDER — INSULIN ASPART 100 UNIT/ML ~~LOC~~ SOLN: 0.0000 [IU] | Freq: Three times a day (TID) | SUBCUTANEOUS | Status: DC

## 2021-02-15 MED ORDER — HYDROMORPHONE HCL 1 MG/ML IJ SOLN
0.5000 mg | INTRAMUSCULAR | Status: DC | PRN
  Administered 2021-02-15 – 2021-02-18 (×7): 0.5 mg via INTRAVENOUS
  Filled 2021-02-15 (×8): qty 1

## 2021-02-15 MED ORDER — OXYCODONE HCL 5 MG PO TABS
15.0000 mg | ORAL_TABLET | Freq: Every day | ORAL | Status: DC | PRN
  Administered 2021-02-15 – 2021-02-18 (×10): 15 mg via ORAL
  Filled 2021-02-15 (×10): qty 3

## 2021-02-15 MED ORDER — HYDRALAZINE HCL 50 MG PO TABS
100.0000 mg | ORAL_TABLET | Freq: Three times a day (TID) | ORAL | Status: DC
Start: 2021-02-15 — End: 2021-02-19
  Administered 2021-02-15 – 2021-02-18 (×6): 100 mg via ORAL
  Filled 2021-02-15 (×8): qty 2
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
## 2021-02-15 NOTE — Progress Notes (Signed)
NEW ADMISSION NOTE New Admission Note:   Arrival Method: wheelchair Mental Orientation: A&O X4 Telemetry:  5M06 Assessment: Completed Skin: Intact, dry flaky, bilateral amputee, right leg abrasion, left leg cracking. IV: none Pain: 10/10 Tubes: none Safety Measures: Safety Fall Prevention Plan has been given, discussed and signed Admission: Completed [facility] [place] Orientation: Patient has been orientated to the room, unit and staff.  Family: none at bedside  Orders have been reviewed and implemented. Will continue to monitor the patient. Call light has been placed within reach and bed alarm has been activated.   Haunani Dickard S Kaevon Cotta, RN

## 2021-02-15 NOTE — Progress Notes (Signed)
Informed of admission   ESRD   _ MWF  [facility] clinic   [address] Optiflux  Dialyzer 2 K    2.5   Ca     EDW 78 Kg    Time 4.15 min  Missed 2 dialysis treatments last 30 d and 6 in last 60 d  LUA fistula   Ran 4 hrs 3/14  Will plan dialysis 3/16    Formal consult will follow    Calcitriol 2.5 mcg  Every treatment

## 2021-02-15 NOTE — Progress Notes (Signed)
Pharmacy Antibiotic Note  [name] is a 60 y.o. male with ESRD (on HD, MWF), diabetes, PVD (S/P multiple amputations), hx of osteomyelitis admitted on 02/15/2021 with swelling and pain in R hand and ischemic necrosis and dry gangrene of both hands.  Pharmacy has been consulted for vancomycin dosing for ischemic necrosis.  Pt is afebrile; admission labs pending; last HD session was on 3/14  Plan: Vancomycin 1750 mg IV X 1, followed by vancomycin 750 mg IV with each HD session Adjust Zosyn to 2.25 gm IV Q 8 hrs, per [place] hemodialysis protocol Monitor WBC, temp, clinical improvement, vancomycin levels if indicated  Weight: 77.3 kg (170 lb 6.7 oz)  Temp (24hrs), Avg:98.8 F (37.1 C), Min:98.8 F (37.1 C), Max:98.8 F (37.1 C)  No results for input(s): WBC, CREATININE, LATICACIDVEN, VANCOTROUGH, VANCOPEAK, VANCORANDOM, GENTTROUGH, GENTPEAK, GENTRANDOM, TOBRATROUGH, TOBRAPEAK, TOBRARND, AMIKACINPEAK, AMIKACINTROU, AMIKACIN in the last 168 hours.  CrCl cannot be calculated (Patient's most recent lab result is older than the maximum 21 days allowed.).    No Known Allergies  Antimicrobials this admission: 3/15 Vancomycin >> 3/15 Zosyn >>  Microbiology results: 3/15 Bld cx: pending  Thank you for allowing pharmacy to be a part of this patient's care.  Gillermina Hu, PharmD, BCPS, [facility] Clinical Pharmacist 02/15/2021 6:13 PM

## 2021-02-16 ENCOUNTER — Encounter (HOSPITAL_COMMUNITY): Payer: Self-pay | Admitting: Internal Medicine

## 2021-02-16 ENCOUNTER — Inpatient Hospital Stay (HOSPITAL_COMMUNITY): Payer: Medicare Other | Admitting: Certified Registered Nurse Anesthetist

## 2021-02-16 ENCOUNTER — Encounter (HOSPITAL_COMMUNITY)
Admission: AD | Disposition: A | Discharge status: Home / Self Care | Payer: Self-pay | Source: Ambulatory Visit | Attending: Family Medicine

## 2021-02-16 ENCOUNTER — Ambulatory Visit (HOSPITAL_COMMUNITY): Admission: RE | Admit: 2021-02-16 | Payer: Medicare Other | Source: Home / Self Care | Admitting: Orthopedic Surgery

## 2021-02-16 HISTORY — PX: AMPUTATION: SHX166

## 2021-02-16 LAB — RENAL FUNCTION PANEL
Albumin: 2.6 g/dL — ABNORMAL LOW (ref 3.5–5.0)
Anion gap: 16 — ABNORMAL HIGH (ref 5–15)
BUN: 44 mg/dL — ABNORMAL HIGH (ref 8–23)
CO2: 26 mmol/L (ref 22–32)
Calcium: 9.2 mg/dL (ref 8.9–10.3)
Chloride: 93 mmol/L — ABNORMAL LOW (ref 98–111)
Creatinine, Ser: 8.72 mg/dL — ABNORMAL HIGH (ref 0.61–1.24)
GFR, Estimated: 6 mL/min — ABNORMAL LOW (ref >60)
Glucose, Bld: 99 mg/dL (ref 70–99)
Phosphorus: 6.6 mg/dL — ABNORMAL HIGH (ref 2.5–4.6)
Potassium: 2.9 mmol/L — ABNORMAL LOW (ref 3.5–5.1)
Sodium: 135 mmol/L (ref 135–145)

## 2021-02-16 LAB — PROTIME-INR
INR: 1.3 — ABNORMAL HIGH (ref 0.8–1.2)
Prothrombin Time: 15.6 seconds — ABNORMAL HIGH (ref 11.4–15.2)

## 2021-02-16 LAB — CBC
HCT: 32.7 % — ABNORMAL LOW (ref 39.0–52.0)
Hemoglobin: 10.3 g/dL — ABNORMAL LOW (ref 13.0–17.0)
MCH: 22.2 pg — ABNORMAL LOW (ref 26.0–34.0)
MCHC: 31.5 g/dL (ref 30.0–36.0)
MCV: 70.6 fL — ABNORMAL LOW (ref 80.0–100.0)
Platelets: 240 10*3/uL (ref 150–400)
RBC: 4.63 MIL/uL (ref 4.22–5.81)
RDW: 19 % — ABNORMAL HIGH (ref 11.5–15.5)
WBC: 12.7 10*3/uL — ABNORMAL HIGH (ref 4.0–10.5)
nRBC: 0 % (ref 0.0–0.2)

## 2021-02-16 LAB — GLUCOSE, CAPILLARY
Glucose-Capillary: 77 mg/dL (ref 70–99)
Glucose-Capillary: 95 mg/dL (ref 70–99)
Glucose-Capillary: 70 mg/dL (ref 70–99)
Glucose-Capillary: 33 mg/dL — CL (ref 70–99)
Glucose-Capillary: 50 mg/dL — ABNORMAL LOW (ref 70–99)
Glucose-Capillary: 54 mg/dL — ABNORMAL LOW (ref 70–99)
Glucose-Capillary: 66 mg/dL — ABNORMAL LOW (ref 70–99)
Glucose-Capillary: 97 mg/dL (ref 70–99)

## 2021-02-16 LAB — APTT: aPTT: 43 seconds — ABNORMAL HIGH (ref 24–36)

## 2021-02-16 SURGERY — AMPUTATION DIGIT
Anesthesia: General | Site: Finger | Laterality: Right

## 2021-02-16 MED ORDER — EPHEDRINE 5 MG/ML INJ
INTRAVENOUS | Status: AC
  Filled 2021-02-16: qty 10

## 2021-02-16 MED ORDER — OXYCODONE HCL 5 MG/5ML PO SOLN: 5.0000 mg | Freq: Once | ORAL | Status: DC | PRN

## 2021-02-16 MED ORDER — PHENYLEPHRINE 40 MCG/ML (10ML) SYRINGE FOR IV PUSH (FOR BLOOD PRESSURE SUPPORT)
PREFILLED_SYRINGE | INTRAVENOUS | Status: AC
  Filled 2021-02-16: qty 10

## 2021-02-16 MED ORDER — FENTANYL CITRATE (PF) 100 MCG/2ML IJ SOLN
25.0000 ug | INTRAMUSCULAR | Status: DC | PRN
  Administered 2021-02-16: 50 ug via INTRAVENOUS

## 2021-02-16 MED ORDER — LACTATED RINGERS IV SOLN: INTRAVENOUS | Status: DC | PRN

## 2021-02-16 MED ORDER — PROPOFOL 10 MG/ML IV BOLUS
INTRAVENOUS | Status: DC | PRN
  Administered 2021-02-16: 150 mg via INTRAVENOUS

## 2021-02-16 MED ORDER — CEFAZOLIN SODIUM-DEXTROSE 2-4 GM/100ML-% IV SOLN
2.0000 g | INTRAVENOUS | Status: AC
  Administered 2021-02-16: 2 g via INTRAVENOUS

## 2021-02-16 MED ORDER — FENTANYL CITRATE (PF) 250 MCG/5ML IJ SOLN
INTRAMUSCULAR | Status: DC | PRN
  Administered 2021-02-16: 50 ug via INTRAVENOUS

## 2021-02-16 MED ORDER — AMISULPRIDE (ANTIEMETIC) 5 MG/2ML IV SOLN: 10.0000 mg | Freq: Once | INTRAVENOUS | Status: DC | PRN

## 2021-02-16 MED ORDER — PHENYLEPHRINE HCL (PRESSORS) 10 MG/ML IV SOLN
INTRAVENOUS | Status: DC | PRN
  Administered 2021-02-16 (×2): 120 ug via INTRAVENOUS

## 2021-02-16 MED ORDER — BUPIVACAINE HCL (PF) 0.25 % IJ SOLN
INTRAMUSCULAR | Status: AC
  Filled 2021-02-16: qty 30

## 2021-02-16 MED ORDER — HEPARIN SODIUM (PORCINE) 1000 UNIT/ML DIALYSIS: 1000.0000 [IU] | INTRAMUSCULAR | Status: DC | PRN

## 2021-02-16 MED ORDER — POVIDONE-IODINE 10 % EX SWAB: 2.0000 "application " | Freq: Once | CUTANEOUS | Status: DC

## 2021-02-16 MED ORDER — ONDANSETRON HCL 4 MG/2ML IJ SOLN
INTRAMUSCULAR | Status: DC | PRN
  Administered 2021-02-16: 4 mg via INTRAVENOUS

## 2021-02-16 MED ORDER — DEXTROSE 50 % IV SOLN
INTRAVENOUS | Status: AC
  Administered 2021-02-16: 25 mL via INTRAVENOUS
  Filled 2021-02-16: qty 50

## 2021-02-16 MED ORDER — BUPIVACAINE HCL 0.25 % IJ SOLN
INTRAMUSCULAR | Status: DC | PRN
  Administered 2021-02-16: 5 mL

## 2021-02-16 MED ORDER — OXYCODONE HCL 5 MG PO TABS: 5.0000 mg | ORAL_TABLET | Freq: Once | ORAL | Status: DC | PRN

## 2021-02-16 MED ORDER — OXYCODONE HCL 5 MG PO TABS
ORAL_TABLET | ORAL | Status: AC
  Filled 2021-02-16: qty 3

## 2021-02-16 MED ORDER — CHLORHEXIDINE GLUCONATE 0.12 % MT SOLN
15.0000 mL | OROMUCOSAL | Status: AC
  Administered 2021-02-16: 15 mL via OROMUCOSAL
  Filled 2021-02-16: qty 15

## 2021-02-16 MED ORDER — LIDOCAINE HCL (PF) 1 % IJ SOLN: 5.0000 mL | INTRAMUSCULAR | Status: DC | PRN

## 2021-02-16 MED ORDER — MIDAZOLAM HCL 2 MG/2ML IJ SOLN
INTRAMUSCULAR | Status: AC
  Filled 2021-02-16: qty 2

## 2021-02-16 MED ORDER — FENTANYL CITRATE (PF) 100 MCG/2ML IJ SOLN
INTRAMUSCULAR | Status: AC
  Filled 2021-02-16: qty 2

## 2021-02-16 MED ORDER — ALTEPLASE 2 MG IJ SOLR: 2.0000 mg | Freq: Once | INTRAMUSCULAR | Status: DC | PRN

## 2021-02-16 MED ORDER — DEXTROSE 250 MG/ML IV SOLN
12.5000 g | Freq: Once | INTRAVENOUS | Status: AC
  Administered 2021-02-16: 12.5 g via INTRAVENOUS

## 2021-02-16 MED ORDER — EPHEDRINE SULFATE 50 MG/ML IJ SOLN
INTRAMUSCULAR | Status: DC | PRN
  Administered 2021-02-16: 15 mg via INTRAVENOUS
  Administered 2021-02-16 (×2): 10 mg via INTRAVENOUS

## 2021-02-16 MED ORDER — FENTANYL CITRATE (PF) 250 MCG/5ML IJ SOLN
INTRAMUSCULAR | Status: AC
  Filled 2021-02-16: qty 5

## 2021-02-16 MED ORDER — PROPOFOL 10 MG/ML IV BOLUS
INTRAVENOUS | Status: AC
  Filled 2021-02-16: qty 40

## 2021-02-16 MED ORDER — FENTANYL CITRATE (PF) 100 MCG/2ML IJ SOLN
50.0000 ug | Freq: Once | INTRAMUSCULAR | Status: DC
Start: 2021-02-16 — End: 2021-02-16
  Filled 2021-02-16: qty 2

## 2021-02-16 MED ORDER — ENSURE PRE-SURGERY PO LIQD
296.0000 mL | Freq: Once | ORAL | Status: AC
  Administered 2021-02-16: 296 mL via ORAL
  Filled 2021-02-16: qty 296

## 2021-02-16 MED ORDER — SODIUM CHLORIDE 0.9 % IV SOLN: INTRAVENOUS | Status: DC

## 2021-02-16 MED ORDER — ONDANSETRON HCL 4 MG/2ML IJ SOLN: 4.0000 mg | Freq: Once | INTRAMUSCULAR | Status: DC | PRN

## 2021-02-16 MED ORDER — SODIUM CHLORIDE 0.9 % IV SOLN: 100.0000 mL | INTRAVENOUS | Status: DC | PRN

## 2021-02-16 MED ORDER — LIDOCAINE-PRILOCAINE 2.5-2.5 % EX CREA: 1.0000 "application " | TOPICAL_CREAM | CUTANEOUS | Status: DC | PRN

## 2021-02-16 MED ORDER — 0.9 % SODIUM CHLORIDE (POUR BTL) OPTIME
TOPICAL | Status: DC | PRN
  Administered 2021-02-16: 1000 mL

## 2021-02-16 MED ORDER — PENTAFLUOROPROP-TETRAFLUOROETH EX AERO: 1.0000 "application " | INHALATION_SPRAY | CUTANEOUS | Status: DC | PRN

## 2021-02-16 SURGICAL SUPPLY — 51 items
BNDG CMPR 9X4 STRL LF SNTH (GAUZE/BANDAGES/DRESSINGS) ×1
BNDG COHESIVE 1X5 TAN STRL LF (GAUZE/BANDAGES/DRESSINGS) IMPLANT
BNDG COHESIVE 4X5 TAN STRL (GAUZE/BANDAGES/DRESSINGS) ×1 IMPLANT
BNDG CONFORM 2 STRL LF (GAUZE/BANDAGES/DRESSINGS) IMPLANT
BNDG ELASTIC 3X5.8 VLCR STR LF (GAUZE/BANDAGES/DRESSINGS) IMPLANT
BNDG ESMARK 4X9 LF (GAUZE/BANDAGES/DRESSINGS) ×2 IMPLANT
BNDG GAUZE ELAST 4 BULKY (GAUZE/BANDAGES/DRESSINGS) ×1 IMPLANT
CORD BIPOLAR FORCEPS 12FT (ELECTRODE) ×2 IMPLANT
COVER SURGICAL LIGHT HANDLE (MISCELLANEOUS) ×2 IMPLANT
COVER WAND RF STERILE (DRAPES) ×1 IMPLANT
CUFF TOURN SGL QUICK 18X4 (TOURNIQUET CUFF) ×1 IMPLANT
CUFF TOURN SGL QUICK 24 (TOURNIQUET CUFF)
CUFF TRNQT CYL 24X4X16.5-23 (TOURNIQUET CUFF) IMPLANT
DRAPE OEC MINIVIEW 54X84 (DRAPES) IMPLANT
DRAPE SURG 17X23 STRL (DRAPES) ×2 IMPLANT
DRSG XEROFORM 1X8 (GAUZE/BANDAGES/DRESSINGS) ×1 IMPLANT
DURAPREP 26ML APPLICATOR (WOUND CARE) ×1 IMPLANT
GAUZE SPONGE 2X2 8PLY STRL LF (GAUZE/BANDAGES/DRESSINGS) IMPLANT
GAUZE SPONGE 4X4 12PLY STRL (GAUZE/BANDAGES/DRESSINGS) ×1 IMPLANT
GAUZE XEROFORM 1X8 LF (GAUZE/BANDAGES/DRESSINGS) IMPLANT
GLOVE SURG SYN 8.0 (GLOVE) ×2 IMPLANT
GLOVE SURG SYN 8.0 PF PI (GLOVE) ×1 IMPLANT
GOWN STRL REUS W/ TWL LRG LVL3 (GOWN DISPOSABLE) ×1 IMPLANT
GOWN STRL REUS W/ TWL XL LVL3 (GOWN DISPOSABLE) ×1 IMPLANT
GOWN STRL REUS W/TWL LRG LVL3 (GOWN DISPOSABLE) ×2
GOWN STRL REUS W/TWL XL LVL3 (GOWN DISPOSABLE) ×2
KIT BASIN OR (CUSTOM PROCEDURE TRAY) ×2 IMPLANT
KIT TURNOVER KIT B (KITS) ×2 IMPLANT
MANIFOLD NEPTUNE II (INSTRUMENTS) ×1 IMPLANT
NDL HYPO 25GX1X1/2 BEV (NEEDLE) IMPLANT
NEEDLE HYPO 25GX1X1/2 BEV (NEEDLE) ×2 IMPLANT
NS IRRIG 1000ML POUR BTL (IV SOLUTION) ×2 IMPLANT
PACK ORTHO EXTREMITY (CUSTOM PROCEDURE TRAY) ×2 IMPLANT
PAD ARMBOARD 7.5X6 YLW CONV (MISCELLANEOUS) ×4 IMPLANT
PAD CAST 3X4 CTTN HI CHSV (CAST SUPPLIES) IMPLANT
PADDING CAST COTTON 3X4 STRL (CAST SUPPLIES) ×2
SOL PREP PROV IODINE SCRUB 4OZ (MISCELLANEOUS) ×1 IMPLANT
SPECIMEN JAR SMALL (MISCELLANEOUS) ×2 IMPLANT
SPONGE GAUZE 2X2 STER 10/PKG (GAUZE/BANDAGES/DRESSINGS)
STRIP CLOSURE SKIN 1/2X4 (GAUZE/BANDAGES/DRESSINGS) IMPLANT
SUCTION FRAZIER HANDLE 10FR (MISCELLANEOUS)
SUCTION TUBE FRAZIER 10FR DISP (MISCELLANEOUS) IMPLANT
SUT MNCRL AB 4-0 PS2 18 (SUTURE) ×4 IMPLANT
SWAB COLLECTION DEVICE MRSA (MISCELLANEOUS) ×1 IMPLANT
SWAB CULTURE ESWAB REG 1ML (MISCELLANEOUS) ×1 IMPLANT
SYR CONTROL 10ML LL (SYRINGE) ×1 IMPLANT
TOWEL GREEN STERILE (TOWEL DISPOSABLE) ×2 IMPLANT
TOWEL GREEN STERILE FF (TOWEL DISPOSABLE) ×2 IMPLANT
TUBE CONNECTING 12X1/4 (SUCTIONS) IMPLANT
UNDERPAD 30X36 HEAVY ABSORB (UNDERPADS AND DIAPERS) ×2 IMPLANT
WATER STERILE IRR 1000ML POUR (IV SOLUTION) ×1 IMPLANT

## 2021-02-16 NOTE — Procedures (Incomplete)
Recheck CBG at [id number] continues to be asymptotic

## 2021-02-16 NOTE — Progress Notes (Signed)
Recheck of CBG 97 at this time

## 2021-02-16 NOTE — Brief Op Note (Signed)
02/16/2021  4:19 PM  PATIENT:  [name]  60 y.o. male  PRE-OPERATIVE DIAGNOSIS:  RIGHT THUMB RIGHT SMALL FINGER NECROSIS  POST-OPERATIVE DIAGNOSIS:  RIGHT THUMB RIGHT SMALL FINGER NECROSIS  PROCEDURE:  Procedure(s): RIGHT THUMB, RIGHT SMALL FINGER REVISIONS AMPUTATION DIGIT (Right)  SURGEON:  Surgeon(s) and Role:    * Charlotte Crumb, MD - Primary  PHYSICIAN ASSISTANT:   ASSISTANTS: none   ANESTHESIA:   general  EBL: Minimal BLOOD ADMINISTERED:none  DRAINS: none   LOCAL MEDICATIONS USED:  MARCAINE     SPECIMEN:  Biopsy / Limited Resection  DISPOSITION OF SPECIMEN:  PATHOLOGY  COUNTS:  YES  TOURNIQUET:   Total Tourniquet Time Documented: Forearm (Right) - 40 minutes Total: Forearm (Right) - 40 minutes   DICTATION: .Viviann Spare Dictation  PLAN OF CARE: Admit to inpatient   PATIENT DISPOSITION:  PACU - hemodynamically stable.   Delay start of Pharmacological VTE agent (>24hrs) due to surgical blood loss or risk of bleeding: yes
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
## 2021-02-16 NOTE — Anesthesia Procedure Notes (Signed)
Procedure Name: LMA Insertion Date/Time: 02/16/2021 3:34 PM Performed by: Kathryne Hitch, CRNA Pre-anesthesia Checklist: Patient identified, Emergency Drugs available, Patient being monitored and Suction available Patient Re-evaluated:Patient Re-evaluated prior to induction Oxygen Delivery Method: Circle system utilized Preoxygenation: Pre-oxygenation with 100% oxygen Induction Type: IV induction Ventilation: Mask ventilation without difficulty LMA: LMA inserted LMA Size: 5.0 Number of attempts: 1 Placement Confirmation: positive ETCO2 and breath sounds checked- equal and bilateral Tube secured with: Tape Dental Injury: Teeth and Oropharynx as per pre-operative assessment

## 2021-02-16 NOTE — Progress Notes (Signed)
PROGRESS NOTE  EDO GRZYB  [id number] DOB: 20-May-1961 DOA: 02/15/2021 PCP: [name], FNP Brief Narrative: [name] is a 60 y.o. male with a history of ESRD, T2DM, diabetic neuropathy, severe PVD with multiple amputations among others who presented to Dr. Bertis Ruddy office 3/15 for follow up of ischemic necrosis of hands and was directly admitted to the hospital due to failure of outpatient management, taken for amputations of thumb and 5th digit on the right on 3/16.   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 Will follow surgery recommendations regarding wound care. Plan to follow up in Dr. Bertis Ruddy office next week for dressing change.  - Follow up operative culture from purulence expressed for dorsal thumb. Will cover with vancomycin, zosyn.  - Continue plavix, statin  T2DM with nephropathy, polyneuropathy uncontrolled with hypoglycemia: Recent HbA1c was 5.1%.  - Stop very sensitive insulin scale. Continue checking and supplementing dextrose as needed.   ESRD, anemia of CKD:  - Appreciate nephrology assistance, planning HD on scheduled 3/16 (MWF).    Chronic pain: No changes to regimen.   Seizure disorder:  - Continue keppra.    DVT prophylaxis: Heparin Code Status: Full Family Communication: None  Disposition Plan:  Status is: Inpatient  Remains inpatient appropriate because: Ongoing active pain requiring inpatient pain management and Ongoing diagnostic testing needed not appropriate for outpatient work up   Dispo: The patient is from: Home              Anticipated d/c is to: Home               Patient currently is not medically stable to d/c.   Consultants:   Nephrology  Surgery  Procedures:  RIGHT THUMB, RIGHT SMALL FINGER REVISIONS AMPUTATION DIGIT (Right) by Dr. Burney Gauze 02/16/2021  Antimicrobials:  Vancomycin, zosyn, ancef   Subjective: Pain is controlled. No other issues, denies fever/chills.   Objective: Vitals:   02/16/21 0448 02/16/21 0948 02/16/21 1640 02/16/21 1655  BP: (!) 166/76 (!) 172/80 (!) 132/56 (!) 142/44  Pulse: 71 76  83  Resp: '16 18 15 11  '$ Temp: 98.8 F (37.1 C) 98.4 F (36.9 C) (!) 97.1 F (36.2 C)   TempSrc:  Oral    SpO2: 100% 100% 92% 93%  Weight:        Intake/Output Summary (Last 24 hours) at 02/16/2021 1722 Last data filed at 02/16/2021 1620 Gross per 24 hour  Intake 840 ml  Output 15 ml  Net 825 ml   Filed Weights   02/15/21 1602 02/15/21 2154  Weight: 77.3 kg 77.3 kg    Gen: Chronically ill-appearing male in no distress Pulm: Non-labored breathing. Clear to auscultation bilaterally.  CV: Regular rate and rhythm. No murmur, rub, or gallop. No JVD or dependent edema GI: Abdomen soft, non-tender, non-distended, with normoactive bowel sounds. No organomegaly or masses felt. Ext: Warm, multiple distal UE amputations and bilateral BKAs Skin: No other lesions noted Neuro: Alert and oriented. No focal neurological deficits. Psych: Judgement and insight appear fair. Mood & affect restricted.   Data Reviewed: I have personally reviewed following labs and  imaging studies  CBC: Recent Labs  Lab 02/15/21 1827  WBC 13.5*  NEUTROABS 10.7*  HGB 11.4*  HCT 36.3*  MCV 70.8*  PLT [id number]   Basic Metabolic Panel: Recent Labs  Lab 02/15/21 1713 02/16/21 0144  NA 136 135  K 3.0* 2.9*  CL 94* 93*  CO2 27 26  GLUCOSE 92 99  BUN 40* 44*  CREATININE 8.27* 8.72*  CALCIUM 9.3 9.2  PHOS  --  6.6*   GFR: Estimated Creatinine Clearance: 9.7 mL/min (A) (by C-G formula based on SCr of 8.72 mg/dL (H)). Liver Function  Tests: Recent Labs  Lab 02/15/21 1713 02/16/21 0144  AST 15  --   ALT 10  --   ALKPHOS 62  --   BILITOT 1.0  --   PROT 8.0  --   ALBUMIN 2.9* 2.6*   No results for input(s): LIPASE, AMYLASE in the last 168 hours. No results for input(s): AMMONIA in the last 168 hours. Coagulation Profile: Recent Labs  Lab 02/16/21 0144  INR 1.3*   Cardiac Enzymes: No results for input(s): CKTOTAL, CKMB, CKMBINDEX, TROPONINI in the last 168 hours. BNP (last 3 results) No results for input(s): PROBNP in the last 8760 hours. HbA1C: No results for input(s): HGBA1C in the last 72 hours. CBG: Recent Labs  Lab 02/15/21 2155 02/16/21 0647 02/16/21 1133 02/16/21 1405 02/16/21 1654  GLUCAP 90 77 95 70 54*   Lipid Profile: No results for input(s): CHOL, HDL, LDLCALC, TRIG, CHOLHDL, LDLDIRECT in the last 72 hours. Thyroid Function Tests: No results for input(s): TSH, T4TOTAL, FREET4, T3FREE, THYROIDAB in the last 72 hours. Anemia Panel: No results for input(s): VITAMINB12, FOLATE, FERRITIN, TIBC, IRON, RETICCTPCT in the last 72 hours. Urine analysis:    Component Value Date/Time   COLORURINE YELLOW 09/10/2018 1927   APPEARANCEUR HAZY (A) 09/10/2018 1927   LABSPEC 1.011 09/10/2018 1927   PHURINE 5.0 09/10/2018 1927   GLUCOSEU 50 (A) 09/10/2018 1927   HGBUR MODERATE (A) 09/10/2018 1927   BILIRUBINUR NEGATIVE 09/10/2018 1927   KETONESUR NEGATIVE 09/10/2018 1927   PROTEINUR 100 (A) 09/10/2018 1927   NITRITE NEGATIVE 09/10/2018 1927   LEUKOCYTESUR NEGATIVE 09/10/2018 1927   Recent Results (from the past 240 hour(s))  SARS CORONAVIRUS 2 (TAT 6-24 HRS) Nasopharyngeal Nasopharyngeal Swab     Status: None   Collection Time: 02/15/21  4:00 PM   Specimen: Nasopharyngeal Swab  Result Value Ref Range Status   SARS Coronavirus 2 NEGATIVE NEGATIVE Final    Comment: (NOTE) SARS-CoV-2 target nucleic acids are NOT DETECTED.  The SARS-CoV-2 RNA is generally detectable in upper and  lower respiratory specimens during the acute phase of infection. Negative results do not preclude SARS-CoV-2 infection, do not rule out co-infections with other pathogens, and should not be used as the sole basis for treatment or other patient management decisions. Negative results must be combined with clinical observations, patient history, and epidemiological information. The expected result is Negative.  Fact Sheet for Patients: SugarRoll.be  Fact Sheet for Healthcare Providers: https://www.woods-mathews.com/  This test is not yet approved or cleared by the [place] FDA and  has been authorized for detection and/or diagnosis of SARS-CoV-2 by FDA under an Emergency Use Authorization (EUA). This EUA will remain  in effect (meaning this test can be used) for the duration of the COVID-19 declaration under Se ction 564(b)(1) of the Act, 21 U.S.C. section 360bbb-3(b)(1), unless the authorization is terminated or revoked sooner.  Performed at [facility], [place]  [address] [place], [place] [address]   Surgical pcr screen     Status: None   Collection Time: 02/15/21  4:30 PM   Specimen: Nasopharyngeal Swab; Nasal Swab  Result Value Ref Range Status   MRSA, PCR NEGATIVE NEGATIVE Final   Staphylococcus aureus NEGATIVE NEGATIVE Final    Comment: (NOTE) The Xpert SA Assay (FDA approved for NASAL specimens in patients 52 years of age and older), is one component of a comprehensive surveillance program. It is not intended to diagnose infection nor to guide or monitor treatment. Performed at [facility], [place] [address], [place], [place] [address]   Culture, blood (routine x 2)     Status: None (Preliminary result)   Collection Time: 02/15/21  5:13 PM   Specimen: BLOOD  Result Value Ref Range Status   Specimen Description BLOOD SITE NOT SPECIFIED  Final   Special Requests   Final    BOTTLES DRAWN AEROBIC AND ANAEROBIC Blood  Culture adequate volume   Culture   Final    NO GROWTH < 24 HOURS Performed at [facility], [place] [address], [place], [place] [address]    Report Status PENDING  Incomplete  Culture, blood (routine x 2)     Status: None (Preliminary result)   Collection Time: 02/15/21  5:13 PM   Specimen: BLOOD  Result Value Ref Range Status   Specimen Description BLOOD SITE NOT SPECIFIED  Final   Special Requests   Final    BOTTLES DRAWN AEROBIC AND ANAEROBIC Blood Culture adequate volume   Culture   Final    NO GROWTH < 24 HOURS Performed at [facility], [place] [address], [place],  [address]    Report Status PENDING  Incomplete      Radiology Studies: DG Hand 2 View Right  Result Date: 02/15/2021 CLINICAL DATA:  Ischemic necrosis. EXAM: RIGHT HAND - 2 VIEW COMPARISON:  Radiograph 03/18/2020 FINDINGS: Remote trans metacarpal amputation of the second ray. Trans metacarpal amputation of the third and fourth rays since prior radiograph. Fifth digit is held in flexion on both views. Fifth digit soft tissues are atrophic. Soft tissue gas noted about the thumb interphalangeal joint. No evidence of bony destruction. Advanced vascular calcifications. IMPRESSION: 1. Soft tissue gas about the thumb interphalangeal joint suspicious for gangrene. 2. Soft tissue atrophy of the fifth digit. 3. Trans metacarpal amputation of the second through fourth ray. 4. Advanced vascular calcifications. Electronically Signed   By: Keith Rake M.D.   On: 02/15/2021 20:25   DG Hand 2 View Left  Result Date: 02/15/2021 CLINICAL DATA:  Ischemic necrosis. EXAM: LEFT HAND - 2 VIEW COMPARISON:  Radiograph 09/06/2020 FINDINGS: Interval resection of the second digit. There are erosions involving the second metacarpal head. Soft tissues of the third, fourth, and fifth digits are atrophic. The third through fifth digits are held in flexion at the proximal interphalangeal joint on both views. No erosions or bony  destruction involving the third through fifth digits. Advanced vascular calcifications. Suspect linear soft tissue air involving the distal third digit volar surface. IMPRESSION: 1. Resection of the second digit. Erosions involving the second metacarpal head suspicious for osteomyelitis. 2. Soft tissue atrophy of the third, fourth, and fifth digits. Suspect linear soft tissue air involving the distal third digit volar surface. Electronically Signed   By: Keith Rake M.D.   On: 02/15/2021 20:28    Scheduled Meds: . [MAR Hold] amLODipine  10 mg Oral Daily  . [facility]  Hold] atorvastatin  80 mg Oral Weekly  . [MAR Hold] calcitRIOL  2.5 mcg Oral Q M,W,F-HD  . [MAR Hold] carvedilol  12.5 mg Oral 2 times per day on Sun Tue Thu Sat  . [MAR Hold] Chlorhexidine Gluconate Cloth  6 each Topical Q0600  . [MAR Hold] clopidogrel  75 mg Oral Daily  . [MAR Hold] DULoxetine  30 mg Oral Daily  . fentaNYL (SUBLIMAZE) injection  50 mcg Intravenous Once  . [MAR Hold] ferrous sulfate  325 mg Oral Q breakfast  . [MAR Hold] heparin  5,000 Units Subcutaneous Q8H  . [MAR Hold] hydrALAZINE  100 mg Oral Q8H  . [MAR Hold] insulin aspart  0-6 Units Subcutaneous TID WC  . [MAR Hold] levETIRAcetam  1,000 mg Oral Daily  . [MAR Hold] levETIRAcetam  500 mg Oral Q M,W,F-HD  . [MAR Hold] multivitamin  1 tablet Oral Q M,W,F  . povidone-iodine  2 application Topical Once  . [MAR Hold] sevelamer carbonate  3,200 mg Oral TID WC  . [MAR Hold] sodium chloride flush  3 mL Intravenous Q12H   Continuous Infusions: . sodium chloride 10 mL/hr at 02/16/21 1450  . dextrose    . [MAR Hold] piperacillin-tazobactam (ZOSYN)  IV 2.25 g (02/16/21 1030)  . [MAR Hold] vancomycin       LOS: 1 day   Time spent: 25 minutes.  Patrecia Pour, MD [facility] [email or web address] 02/16/2021, 5:22 PM

## 2021-02-16 NOTE — Progress Notes (Signed)
Patient underwent revision amputation right thumb at level of mid proximal phalanx with primary wound closure and right small finger at metacarpal phalangeal joint level with primary wound closure.  A small bit of purulence was cultured over the dorsal aspect of the thumb.  The rest of the wounds were clean no sign of infection.  Patient can be seen in my office next week for dressing change.

## 2021-02-16 NOTE — Progress Notes (Signed)
Hypoglycemic Event  CBG: 54 Treatment: dextrose 12.5 grams Symptoms:none Follow-up CBG: Time:CBG Result:  Possible Reasons for Event:  Comments/MD notified: Nickola Major
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
## 2021-02-16 NOTE — Anesthesia Preprocedure Evaluation (Addendum)
Anesthesia Evaluation  Patient identified by MRN, date of birth, ID band Patient awake    Reviewed: Allergy & Precautions, NPO status , Patient's Chart, lab work & pertinent test results  Airway Mallampati: III  TM Distance: >3 FB Neck ROM: Full    Dental   Pulmonary former smoker,    Pulmonary exam normal breath sounds clear to auscultation       Cardiovascular hypertension, Pt. on medications + Peripheral Vascular Disease  Normal cardiovascular exam Rhythm:Regular Rate:Normal     Neuro/Psych Seizures -,   Neuromuscular disease negative psych ROS   GI/Hepatic GERD  ,(+) Hepatitis -, C  Endo/Other  diabetes, Poorly Controlled, Type 2, Insulin Dependent  Renal/GU Renal Insufficiency, Dialysis and ESRFRenal disease  negative genitourinary   Musculoskeletal negative musculoskeletal ROS (+)   Abdominal Normal abdominal exam  (+)   Peds negative pediatric ROS (+)  Hematology  (+) anemia ,   Anesthesia Other Findings   Reproductive/Obstetrics negative OB ROS                           Anesthesia Physical Anesthesia Plan  ASA: IV  Anesthesia Plan: General   Post-op Pain Management:    Induction: Intravenous  PONV Risk Score and Plan: 2 and Ondansetron and Treatment may vary due to age or medical condition  Airway Management Planned:   Additional Equipment: None  Intra-op Plan:   Post-operative Plan: Extubation in OR  Informed Consent: I have reviewed the patients History and Physical, chart, labs and discussed the procedure including the risks, benefits and alternatives for the proposed anesthesia with the patient or authorized representative who has indicated his/her understanding and acceptance.       Plan Discussed with: CRNA, Anesthesiologist and Surgeon  Anesthesia Plan Comments:        Anesthesia Quick Evaluation

## 2021-02-16 NOTE — Progress Notes (Signed)
Recheck of CBG at 1715 at 33 patient asymptotic dextrose 12.5g given at this Dr. Tobias Alexander made aware recheck bBCG at 1720 50 patient asymptotic

## 2021-02-16 NOTE — Op Note (Signed)
Patient was taken to the operating suite and after induction of adequate general anesthetic the right upper extremity was prepped and draped in the usual sterile fashion.  An Esmarch was used to exsanguinate the limb and the forearm tourniquet was inflated 250 mmHg.  This point time the right thumb was approached surgically through a mid lateral incision and dorsal and palmar flaps were raised.  Dissection was carried down to the interphalangeal joint which was disarticulated and then dissection was carried back in the proximal phalanx of the midshaft area until normal-appearing bone was encountered.  We performed bilateral digital nerve neurectomies and cauterized the arteries of bipolar cautery we then were able to close the wound after thorough irrigation with above events flap using 4-0 Monocryl suture.  Right small finger was then approached surgically with mid lateral incisions to the level of the proximal phalangeal joint which we disarticulated both the thumb and small finger were sent for pathologic confirmation.  We then dissected dorsal and palmar flaps to the level of the metacarpal phalangeal joint disarticulated the proximal phalanx from metacarpal phalangeal joint level.  We performed bilateral neurectomies and used bipolar cautery to cauterize the digital nerves.  We then thoroughly irrigated and excised some of the dorsal skin and advanced the palmar skin dorsally to have a primary wound closure.  This was done with 4 Monocryl as well.  We then did both ulnar and median nerve blocks at the wrist core percent Marcaine 5 cc each.  We then dressed with Xeroform, 4 x 4's, compressive bandage.  The patient tolerated these procedures well went recovery in stable fashion.

## 2021-02-17 ENCOUNTER — Encounter (HOSPITAL_COMMUNITY): Payer: Self-pay | Admitting: Orthopedic Surgery

## 2021-02-17 LAB — GLUCOSE, CAPILLARY
Glucose-Capillary: 79 mg/dL (ref 70–99)
Glucose-Capillary: 93 mg/dL (ref 70–99)
Glucose-Capillary: 111 mg/dL — ABNORMAL HIGH (ref 70–99)
Glucose-Capillary: 88 mg/dL (ref 70–99)
Glucose-Capillary: 100 mg/dL — ABNORMAL HIGH (ref 70–99)

## 2021-02-17 MED ORDER — VANCOMYCIN HCL IN DEXTROSE 750-5 MG/150ML-% IV SOLN
750.0000 mg | Freq: Once | INTRAVENOUS | Status: AC
  Administered 2021-02-17: 750 mg via INTRAVENOUS
  Filled 2021-02-17: qty 150

## 2021-02-17 MED ORDER — POTASSIUM CHLORIDE CRYS ER 20 MEQ PO TBCR
40.0000 meq | EXTENDED_RELEASE_TABLET | Freq: Once | ORAL | Status: AC
  Administered 2021-02-17: 40 meq via ORAL
  Filled 2021-02-17: qty 2

## 2021-02-17 NOTE — Transfer of Care (Signed)
Immediate Anesthesia Transfer of Care Note  Patient: [name]  Procedure(s) Performed: RIGHT THUMB, RIGHT SMALL FINGER REVISIONS AMPUTATION DIGIT (Right Finger)  Patient Location: PACU  Anesthesia Type:General  Level of Consciousness: drowsy and patient cooperative  Airway & Oxygen Therapy: Patient Spontanous Breathing  Post-op Assessment: Report given to RN and Post -op Vital signs reviewed and stable  Post vital signs: Reviewed and stable  Last Vitals:  Vitals Value Taken Time  BP 145/72 02/17/21 0357  Temp 36.8 C 02/17/21 0147  Pulse 102 02/17/21 0147  Resp 18 02/17/21 0147  SpO2 96 % 02/16/21 1808    Last Pain:  Vitals:   02/17/21 0903  TempSrc:   PainSc: 10-Worst pain ever      Patients Stated Pain Goal: 0 ([id number])  Complications: No complications documented.

## 2021-02-17 NOTE — Progress Notes (Signed)
PROGRESS NOTE  DIMETRIUS [name]  [id number] DOB: 08/13/1961 DOA: 02/15/2021 PCP: [name], FNP Brief Narrative: [name] is a 60 y.o. male with a history of ESRD, T2DM, diabetic neuropathy, severe PVD with multiple amputations among others who presented to Dr. Bertis Ruddy office 3/15 for follow up of ischemic necrosis of hands and was directly admitted to the hospital due to failure of outpatient management, taken for amputations of thumb and 5th digit on the right on 3/16.   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 Plan to follow up in Dr. Bertis Ruddy office next week for dressing change.  - Follow up operative culture from purulence expressed for dorsal thumb. This is still pending. Will cover with vancomycin, zosyn. Plan to transition to oral coverage if cultures remain negative and WBC improves.   - Continue plavix, statin  T2DM with nephropathy, polyneuropathy uncontrolled with hypoglycemia: Recent HbA1c was 5.1%.  - Stabilizing. Continue checking and supplementing dextrose as needed.   ESRD, anemia of CKD:  - Appreciate nephrology assistance, planning HD on scheduled 3/18 (MWF).    Chronic pain: No changes to regimen.   Seizure disorder:  - Continue keppra.   DVT prophylaxis: Heparin Code Status: Full Family Communication: None  Disposition Plan:  Status is: Inpatient  Remains inpatient appropriate because: Ongoing active pain requiring inpatient pain management and Ongoing diagnostic testing needed not appropriate for outpatient work up   Dispo: The patient is from: Home               Anticipated d/c is to: Home 02/18/2021              Patient currently is not medically stable to d/c.   Consultants:   Nephrology  Surgery  Procedures:  RIGHT THUMB, RIGHT SMALL FINGER REVISIONS AMPUTATION DIGIT (Right) by Dr. Burney Gauze 02/16/2021  Antimicrobials:  Vancomycin, zosyn, ancef   Subjective: Having some pain mostly near the operative site but did not get pain medication by time of interview. No fevers or chills.  Objective: Vitals:   02/17/21 0147 02/17/21 0357 02/17/21 1006 02/17/21 1702  BP: (!) 200/95 (!) 145/72 139/75 (!) 156/83  Pulse: (!) 102  84 79  Resp: '18  17 18  '$ Temp: 98.2 F (36.8 C)  98.3 F (36.8 C) 98.4 F (36.9 C)  TempSrc:      SpO2:   97% 99%  Weight:        Intake/Output Summary (Last 24 hours) at 02/17/2021 1740 Last data filed at 02/17/2021 1647 Gross per 24 hour  Intake 724.66 ml  Output 2000 ml  Net -1275.34 ml   Filed Weights   02/15/21 2154 02/16/21 2124 02/17/21 0110  Weight: 77.3 kg 81.5 kg 79.5 kg   Gen: 60yo M appearing older than stated age in no distress Pulm: Nonlabored breathing room air. Clear. CV: Regular rate and rhythm. No murmur, rub, or gallop. No JVD, no pitting dependent edema. GI: Abdomen soft, non-tender, non-distended, with normoactive bowel sounds.  Ext: Warm, Multiple finger amputations on left, R dressing not uncovered. LE's with bilateral BKAs.  Skin: R hand and forearm in  ACE wrap, no proximal erythema.  Neuro: Alert and oriented. No focal neurological deficits. Psych: Judgement and insight appear fair. Mood euthymic & affect congruent. Behavior is appropriate.    Data Reviewed: I have personally reviewed following labs and imaging studies  CBC: Recent Labs  Lab 02/15/21 1827 02/16/21 2016  WBC 13.5* 12.7*  NEUTROABS 10.7*  --   HGB 11.4* 10.3*  HCT 36.3* 32.7*  MCV 70.8* 70.6*  PLT 250 [id number]   Basic Metabolic Panel: Recent Labs  Lab 02/15/21 1713 02/16/21 0144  NA 136 135  K 3.0* 2.9*  CL  94* 93*  CO2 27 26  GLUCOSE 92 99  BUN 40* 44*  CREATININE 8.27* 8.72*  CALCIUM 9.3 9.2  PHOS  --  6.6*   GFR: Estimated Creatinine Clearance: 10 mL/min (A) (by C-G formula based on SCr of 8.72 mg/dL (H)). Liver Function Tests: Recent Labs  Lab 02/15/21 1713 02/16/21 0144  AST 15  --   ALT 10  --   ALKPHOS 62  --   BILITOT 1.0  --   PROT 8.0  --   ALBUMIN 2.9* 2.6*   No results for input(s): LIPASE, AMYLASE in the last 168 hours. No results for input(s): AMMONIA in the last 168 hours. Coagulation Profile: Recent Labs  Lab 02/16/21 0144  INR 1.3*   CBG: Recent Labs  Lab 02/16/21 1748 02/17/21 0147 02/17/21 0639 02/17/21 1107 02/17/21 1703  GLUCAP 97 79 93 111* 88   Urine analysis:    Component Value Date/Time   COLORURINE YELLOW 09/10/2018 1927   APPEARANCEUR HAZY (A) 09/10/2018 1927   LABSPEC 1.011 09/10/2018 1927   PHURINE 5.0 09/10/2018 1927   GLUCOSEU 50 (A) 09/10/2018 1927   HGBUR MODERATE (A) 09/10/2018 1927   BILIRUBINUR NEGATIVE 09/10/2018 1927   KETONESUR NEGATIVE 09/10/2018 1927   PROTEINUR 100 (A) 09/10/2018 1927   NITRITE NEGATIVE 09/10/2018 1927   LEUKOCYTESUR NEGATIVE 09/10/2018 1927   Recent Results (from the past 240 hour(s))  SARS CORONAVIRUS 2 (TAT 6-24 HRS) Nasopharyngeal Nasopharyngeal Swab     Status: None   Collection Time: 02/15/21  4:00 PM   Specimen: Nasopharyngeal Swab  Result Value Ref Range Status   SARS Coronavirus 2 NEGATIVE NEGATIVE Final    Comment: (NOTE) SARS-CoV-2 target nucleic acids are NOT DETECTED.  The SARS-CoV-2 RNA is generally detectable in upper and lower respiratory specimens during the acute phase of infection. Negative results do not preclude SARS-CoV-2 infection, do not rule out co-infections with other pathogens, and should not be used as the sole basis for treatment or other patient management decisions. Negative results must be combined with clinical observations, patient history, and  epidemiological information. The expected result is Negative.  Fact Sheet for Patients: SugarRoll.be  Fact Sheet for Healthcare Providers: https://www.woods-mathews.com/  This test is not yet approved or cleared by the [place] FDA and  has been authorized for detection and/or diagnosis of SARS-CoV-2 by FDA under an Emergency Use Authorization (EUA). This EUA will remain  in effect (meaning this test can be used) for the duration of the COVID-19 declaration under Se ction 564(b)(1) of the Act, 21 U.S.C. section 360bbb-3(b)(1), unless the authorization is terminated or revoked sooner.  Performed at [facility], [place] [address], [place], [place] [address]   Surgical pcr screen     Status: None   Collection Time: 02/15/21  4:30 PM   Specimen: Nasopharyngeal Swab; Nasal Swab  Result Value Ref Range Status   MRSA, PCR  NEGATIVE NEGATIVE Final   Staphylococcus aureus NEGATIVE NEGATIVE Final    Comment: (NOTE) The Xpert SA Assay (FDA approved for NASAL specimens in patients 50 years of age and older), is one component of a comprehensive surveillance program. It is not intended to diagnose infection nor to guide or monitor treatment. Performed at [facility], [place] [address], [place], [place] [address]   Culture, blood (routine x 2)     Status: None (Preliminary result)   Collection Time: 02/15/21  5:13 PM   Specimen: BLOOD  Result Value Ref Range Status   Specimen Description BLOOD SITE NOT SPECIFIED  Final   Special Requests   Final    BOTTLES DRAWN AEROBIC AND ANAEROBIC Blood Culture adequate volume   Culture   Final    NO GROWTH 2 DAYS Performed at [facility], [place] [address] [place], [place] [address]    Report Status PENDING  Incomplete  Culture, blood (routine x 2)     Status: None (Preliminary result)   Collection Time: 02/15/21  5:13 PM   Specimen: BLOOD  Result Value Ref Range Status   Specimen  Description BLOOD SITE NOT SPECIFIED  Final   Special Requests   Final    BOTTLES DRAWN AEROBIC AND ANAEROBIC Blood Culture adequate volume   Culture   Final    NO GROWTH 2 DAYS Performed at [facility], [place] [address] [place], [place] [address]    Report Status PENDING  Incomplete      Radiology Studies: DG Hand 2 View Right  Result Date: 02/15/2021 CLINICAL DATA:  Ischemic necrosis. EXAM: RIGHT HAND - 2 VIEW COMPARISON:  Radiograph 03/18/2020 FINDINGS: Remote trans metacarpal amputation of the second ray. Trans metacarpal amputation of the third and fourth rays since prior radiograph. Fifth digit is held in flexion on both views. Fifth digit soft tissues are atrophic. Soft tissue gas noted about the thumb interphalangeal joint. No evidence of bony destruction. Advanced vascular calcifications. IMPRESSION: 1. Soft tissue gas about the thumb interphalangeal joint suspicious for gangrene. 2. Soft tissue atrophy of the fifth digit. 3. Trans metacarpal amputation of the second through fourth ray. 4. Advanced vascular calcifications. Electronically Signed   By: Keith Rake M.D.   On: 02/15/2021 20:25   DG Hand 2 View Left  Result Date: 02/15/2021 CLINICAL DATA:  Ischemic necrosis. EXAM: LEFT HAND - 2 VIEW COMPARISON:  Radiograph 09/06/2020 FINDINGS: Interval resection of the second digit. There are erosions involving the second metacarpal head. Soft tissues of the third, fourth, and fifth digits are atrophic. The third through fifth digits are held in flexion at the proximal interphalangeal joint on both views. No erosions or bony destruction involving the third through fifth digits. Advanced vascular calcifications. Suspect linear soft tissue air involving the distal third digit volar surface. IMPRESSION: 1. Resection of the second digit. Erosions involving the second metacarpal head suspicious for osteomyelitis. 2. Soft tissue atrophy of the third, fourth, and fifth digits. Suspect linear  soft tissue air involving the distal third digit volar surface. Electronically Signed   By: Keith Rake M.D.   On: 02/15/2021 20:28    Scheduled Meds: . amLODipine  10 mg Oral Daily  . atorvastatin  80 mg Oral Weekly  . calcitRIOL  2.5 mcg Oral Q M,W,F-HD  . carvedilol  12.5 mg Oral 2 times per day on Sun Tue Thu Sat  . Chlorhexidine Gluconate Cloth  6 each Topical Q0600  . clopidogrel  75  mg Oral Daily  . DULoxetine  30 mg Oral Daily  . ferrous sulfate  325 mg Oral Q breakfast  . heparin  5,000 Units Subcutaneous Q8H  . hydrALAZINE  100 mg Oral Q8H  . levETIRAcetam  1,000 mg Oral Daily  . levETIRAcetam  500 mg Oral Q M,W,F-HD  . multivitamin  1 tablet Oral Q M,W,F  . sevelamer carbonate  3,200 mg Oral TID WC  . sodium chloride flush  3 mL Intravenous Q12H   Continuous Infusions: . sodium chloride 10 mL/hr at 02/16/21 1450  . piperacillin-tazobactam (ZOSYN)  IV 2.25 g (02/17/21 1647)  . vancomycin       LOS: 2 days   Time spent: 25 minutes.  Patrecia Pour, MD [facility] [email or web address] 02/17/2021, 5:40 PM

## 2021-02-17 NOTE — Plan of Care (Signed)
  Problem: Education: Goal: Knowledge of General Education information will improve Description Including pain rating scale, medication(s)/side effects and non-pharmacologic comfort measures Outcome: Progressing   Problem: Health Behavior/Discharge Planning: Goal: Ability to manage health-related needs will improve Outcome: Progressing   

## 2021-02-17 NOTE — Progress Notes (Signed)
[name] Progress Note   Subjective: Seen in room. He is slightly diaphoretic check BS-97. No C/Os. HD tomorrow on schedule.     Objective Vitals:   02/17/21 0110 02/17/21 0147 02/17/21 0357 02/17/21 1006  BP: (!) 206/98 (!) 200/95 (!) 145/72 139/75  Pulse: 95 (!) 102  84  Resp: '15 18  17  '$ Temp: 99.8 F (37.7 C) 98.2 F (36.8 C)  98.3 F (36.8 C)  TempSrc: Oral     SpO2:    97%  Weight: 79.5 kg      Physical Exam General: Chronically ill appearing male in NAD Heart: S1,S2 RRR Lungs: CTAB Abdomen:S,NT active BS Extremities: bilateral BKAs no stump edema. R hand in ace drsg. Finger amps L hand.  Dialysis Access: L AVF + bruit    Additional Objective Labs: Basic Metabolic Panel: Recent Labs  Lab 02/15/21 1713 02/16/21 0144  NA 136 135  K 3.0* 2.9*  CL 94* 93*  CO2 27 26  GLUCOSE 92 99  BUN 40* 44*  CREATININE 8.27* 8.72*  CALCIUM 9.3 9.2  PHOS  --  6.6*   Liver Function Tests: Recent Labs  Lab 02/15/21 1713 02/16/21 0144  AST 15  --   ALT 10  --   ALKPHOS 62  --   BILITOT 1.0  --   PROT 8.0  --   ALBUMIN 2.9* 2.6*   No results for input(s): LIPASE, AMYLASE in the last 168 hours. CBC: Recent Labs  Lab 02/15/21 1827 02/16/21 2016  WBC 13.5* 12.7*  NEUTROABS 10.7*  --   HGB 11.4* 10.3*  HCT 36.3* 32.7*  MCV 70.8* 70.6*  PLT 250 240   Blood Culture    Component Value Date/Time   SDES BLOOD SITE NOT SPECIFIED 02/15/2021 1713   SDES BLOOD SITE NOT SPECIFIED 02/15/2021 1713   SPECREQUEST  02/15/2021 1713    BOTTLES DRAWN AEROBIC AND ANAEROBIC Blood Culture adequate volume   SPECREQUEST  02/15/2021 1713    BOTTLES DRAWN AEROBIC AND ANAEROBIC Blood Culture adequate volume   CULT  02/15/2021 1713    NO GROWTH 2 DAYS Performed at [facility], [place] [address], [place], [place] [address]    CULT  02/15/2021 1713    NO GROWTH 2 DAYS Performed at [facility], [place] [address], [place],  [address]    REPTSTATUS  PENDING 02/15/2021 1713   REPTSTATUS PENDING 02/15/2021 1713    Cardiac Enzymes: No results for input(s): CKTOTAL, CKMB, CKMBINDEX, TROPONINI in the last 168 hours. CBG: Recent Labs  Lab 02/16/21 1724 02/16/21 1734 02/16/21 1748 02/17/21 0147 02/17/21 0639  GLUCAP 50* 66* 97 79 93   Iron Studies: No results for input(s): IRON, TIBC, TRANSFERRIN, FERRITIN in the last 72 hours. '@[email or web address]'$ @ Studies/Results: DG Hand 2 View Right  Result Date: 02/15/2021 CLINICAL DATA:  Ischemic necrosis. EXAM: RIGHT HAND - 2 VIEW COMPARISON:  Radiograph 03/18/2020 FINDINGS: Remote trans metacarpal amputation of the second ray. Trans metacarpal amputation of the third and fourth rays since prior radiograph. Fifth digit is held in flexion on both views. Fifth digit soft tissues are atrophic. Soft tissue gas noted about the thumb interphalangeal joint. No evidence of bony destruction. Advanced vascular calcifications. IMPRESSION: 1. Soft tissue gas about the thumb interphalangeal joint suspicious for gangrene. 2. Soft tissue atrophy of the fifth digit. 3. Trans metacarpal amputation of the second through fourth ray. 4. Advanced vascular calcifications. Electronically Signed   By: Keith Rake M.D.   On:  02/15/2021 20:25   DG Hand 2 View Left  Result Date: 02/15/2021 CLINICAL DATA:  Ischemic necrosis. EXAM: LEFT HAND - 2 VIEW COMPARISON:  Radiograph 09/06/2020 FINDINGS: Interval resection of the second digit. There are erosions involving the second metacarpal head. Soft tissues of the third, fourth, and fifth digits are atrophic. The third through fifth digits are held in flexion at the proximal interphalangeal joint on both views. No erosions or bony destruction involving the third through fifth digits. Advanced vascular calcifications. Suspect linear soft tissue air involving the distal third digit volar surface. IMPRESSION: 1. Resection of the second digit. Erosions involving the second metacarpal head  suspicious for osteomyelitis. 2. Soft tissue atrophy of the third, fourth, and fifth digits. Suspect linear soft tissue air involving the distal third digit volar surface. Electronically Signed   By: Keith Rake M.D.   On: 02/15/2021 20:28   Medications: . sodium chloride 10 mL/hr at 02/16/21 1450  . piperacillin-tazobactam (ZOSYN)  IV 2.25 g (02/17/21 0847)  . vancomycin    . vancomycin     . amLODipine  10 mg Oral Daily  . atorvastatin  80 mg Oral Weekly  . calcitRIOL  2.5 mcg Oral Q M,W,F-HD  . carvedilol  12.5 mg Oral 2 times per day on Sun Tue Thu Sat  . Chlorhexidine Gluconate Cloth  6 each Topical Q0600  . clopidogrel  75 mg Oral Daily  . DULoxetine  30 mg Oral Daily  . ferrous sulfate  325 mg Oral Q breakfast  . heparin  5,000 Units Subcutaneous Q8H  . hydrALAZINE  100 mg Oral Q8H  . levETIRAcetam  1,000 mg Oral Daily  . levETIRAcetam  500 mg Oral Q M,W,F-HD  . multivitamin  1 tablet Oral Q M,W,F  . sevelamer carbonate  3,200 mg Oral TID WC  . sodium chloride flush  3 mL Intravenous Q12H   HD orders: Emilie Rutter MWF 4:15 hrs 200NRe 425/700 75 kg 2.0 K/2.5 Ca L AVF -No heparin, ESA, Venofer -Calcitriol 2.5 mcg PO TIW    Assessment/Plan: 1. 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 Burney Gauze 02/16/21. ABX per primary 2. ESRD -MWF next HD 02/17/21.  3. Hypokalemia: K+ 2.9 today. Give KDUR 40 MEQ X 1 dose today. No 4.0 K baths available D/T [employer]. Will have to follow labs and supplement.  3. Anemia - HGB at goal. No ESA needed.  4. Secondary hyperparathyroidism - continue binders/VDRA.  5. HTN/volume - BP high HD last PM Net UF 2 liters. Post wt 79.5 kg. Very much above OP EDW. Continue to lower volume as tolerated.   6. Nutrition - Low albumin. Start protein supps.  7. DM-per primary 8. H/O seizure disorder-per primary.   Gabriell Daigneault H. Jerian Morais NP-C 02/17/2021, 10:53 AM  [employer] [phone]

## 2021-02-17 NOTE — Anesthesia Postprocedure Evaluation (Signed)
Anesthesia Post Note  Patient: [name]  Procedure(s) Performed: RIGHT THUMB, RIGHT SMALL FINGER REVISIONS AMPUTATION DIGIT (Right Finger)     Anesthesia Post Evaluation No complications documented.  Last Vitals:  Vitals:   02/17/21 1006 02/17/21 1702  BP: 139/75 (!) 156/83  Pulse: 84 79  Resp: 17 18  Temp: 36.8 C 36.9 C  SpO2: 97% 99%    Last Pain:  Vitals:   02/17/21 1617  TempSrc:   PainSc: Asleep                 Catalina Gravel

## 2021-02-17 NOTE — Plan of Care (Signed)

## 2021-02-18 LAB — GLUCOSE, CAPILLARY
Glucose-Capillary: 74 mg/dL (ref 70–99)
Glucose-Capillary: 96 mg/dL (ref 70–99)
Glucose-Capillary: 110 mg/dL — ABNORMAL HIGH (ref 70–99)

## 2021-02-18 LAB — RENAL FUNCTION PANEL
Albumin: 2.5 g/dL — ABNORMAL LOW (ref 3.5–5.0)
Anion gap: 13 (ref 5–15)
BUN: 33 mg/dL — ABNORMAL HIGH (ref 8–23)
CO2: 28 mmol/L (ref 22–32)
Calcium: 9.7 mg/dL (ref 8.9–10.3)
Chloride: 96 mmol/L — ABNORMAL LOW (ref 98–111)
Creatinine, Ser: 8.09 mg/dL — ABNORMAL HIGH (ref 0.61–1.24)
GFR, Estimated: 7 mL/min — ABNORMAL LOW (ref >60)
Glucose, Bld: 93 mg/dL (ref 70–99)
Phosphorus: 6.3 mg/dL — ABNORMAL HIGH (ref 2.5–4.6)
Potassium: 3.8 mmol/L (ref 3.5–5.1)
Sodium: 137 mmol/L (ref 135–145)

## 2021-02-18 LAB — CBC WITH DIFFERENTIAL/PLATELET
Abs Immature Granulocytes: 0.07 10*3/uL (ref 0.00–0.07)
Basophils Absolute: 0.1 10*3/uL (ref 0.0–0.1)
Basophils Relative: 0 %
Eosinophils Absolute: 0.2 10*3/uL (ref 0.0–0.5)
Eosinophils Relative: 2 %
HCT: 33.8 % — ABNORMAL LOW (ref 39.0–52.0)
Hemoglobin: 10.4 g/dL — ABNORMAL LOW (ref 13.0–17.0)
Immature Granulocytes: 1 %
Lymphocytes Relative: 11 %
Lymphs Abs: 1.5 10*3/uL (ref 0.7–4.0)
MCH: 22.1 pg — ABNORMAL LOW (ref 26.0–34.0)
MCHC: 30.8 g/dL (ref 30.0–36.0)
MCV: 71.9 fL — ABNORMAL LOW (ref 80.0–100.0)
Monocytes Absolute: 1.5 10*3/uL — ABNORMAL HIGH (ref 0.1–1.0)
Monocytes Relative: 11 %
Neutro Abs: 10 10*3/uL — ABNORMAL HIGH (ref 1.7–7.7)
Neutrophils Relative %: 75 %
Platelets: 235 10*3/uL (ref 150–400)
RBC: 4.7 MIL/uL (ref 4.22–5.81)
RDW: 19.1 % — ABNORMAL HIGH (ref 11.5–15.5)
WBC: 13.3 10*3/uL — ABNORMAL HIGH (ref 4.0–10.5)
nRBC: 0 % (ref 0.0–0.2)

## 2021-02-18 MED ORDER — SODIUM CHLORIDE 0.9 % IV SOLN: 100.0000 mL | INTRAVENOUS | Status: DC | PRN

## 2021-02-18 MED ORDER — SULFAMETHOXAZOLE-TRIMETHOPRIM 800-160 MG PO TABS: 1.0000 | ORAL_TABLET | Freq: Two times a day (BID) | ORAL | 0 refills | Status: DC

## 2021-02-18 MED ORDER — LIDOCAINE-PRILOCAINE 2.5-2.5 % EX CREA: 1.0000 "application " | TOPICAL_CREAM | CUTANEOUS | Status: DC | PRN

## 2021-02-18 MED ORDER — LIDOCAINE HCL (PF) 1 % IJ SOLN: 5.0000 mL | INTRAMUSCULAR | Status: DC | PRN

## 2021-02-18 MED ORDER — PENTAFLUOROPROP-TETRAFLUOROETH EX AERO: 1.0000 "application " | INHALATION_SPRAY | CUTANEOUS | Status: DC | PRN

## 2021-02-18 MED ORDER — OXYCODONE HCL 5 MG PO TABS
ORAL_TABLET | ORAL | Status: AC
  Administered 2021-02-18: 15 mg via ORAL
  Filled 2021-02-18: qty 3

## 2021-02-18 MED ORDER — CEFAZOLIN SODIUM-DEXTROSE 2-4 GM/100ML-% IV SOLN
2.0000 g | INTRAVENOUS | Status: DC
  Administered 2021-02-18: 2 g via INTRAVENOUS
  Filled 2021-02-18: qty 100

## 2021-02-18 NOTE — Progress Notes (Signed)
DISCHARGE NOTE HOME [name] to be discharged Home per MD order. Discussed prescriptions and follow up appointments with the patient. Prescriptions given to patient; medication list explained in detail. Patient verbalized understanding.  Skin clean, dry and intact without evidence of skin break down, no evidence of skin tears noted. IV catheter discontinued intact. Site without signs and symptoms of complications. Dressing and pressure applied. Pt denies pain at the site currently. No complaints noted.  Patient free of lines, drains, and wounds.   An After Visit Summary (AVS) was printed and given to the patient. Patient escorted via wheelchair, and discharged home via private auto.  Vira Agar, RN

## 2021-02-18 NOTE — Progress Notes (Signed)
[facility] Progress Note   Subjective:   Seen in room, alert and in NAD. Denies CP, palpitations, dizziness, abdominal pain, N/V/D. Expresses that he hopes to go home soon.   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 R hand wrapped. 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5* 12.7* 13.3*  NEUTROABS 10.7*  --  10.0*  HGB 11.4* 10.3* 10.4*  HCT 36.3* 32.7* 33.8*  MCV 70.8* 70.6* 71.9*  PLT 250 240 235   Blood Culture    Component Value Date/Time   SDES WOUND 02/16/2021 1538   SPECREQUEST RIGHT THUMB SPEC A 02/16/2021 1538   CULT  02/16/2021 1538    MODERATE GRAM NEGATIVE RODS IDENTIFICATION TO FOLLOW CULTURE REINCUBATED FOR BETTER GROWTH Performed at [facility], [place] [address] [place], [place] [address]    REPTSTATUS PENDING  02/16/2021 1538   CBG: Recent Labs  Lab 02/17/21 0639 02/17/21 1107 02/17/21 1703 02/17/21 2147 02/18/21 0630  GLUCAP 93 111* 88 100* 74  Medications: . sodium chloride 10 mL/hr at 02/16/21 1450  .  ceFAZolin (ANCEF) IV     . amLODipine  10 mg Oral Daily  . atorvastatin  80 mg Oral Weekly  . calcitRIOL  2.5 mcg Oral Q M,W,F-HD  . carvedilol  12.5 mg Oral 2 times per day on Sun Tue Thu Sat  . Chlorhexidine Gluconate Cloth  6 each Topical Q0600  . clopidogrel  75 mg Oral Daily  . DULoxetine  30 mg Oral Daily  . ferrous sulfate  325 mg Oral Q breakfast  . heparin  5,000 Units Subcutaneous Q8H  . hydrALAZINE  100 mg Oral Q8H  . levETIRAcetam  1,000 mg Oral Daily  . levETIRAcetam  500 mg Oral Q M,W,F-HD  . multivitamin  1 tablet Oral Q M,W,F  . sevelamer carbonate  3,200 mg Oral TID WC  . sodium chloride flush  3 mL Intravenous Q12H    Dialysis Orders: [facility] MWF 4:15 hrs 200NRe 425/700 75 kg 2.0 K/2.5 Ca L AVF -  No heparin, ESA, Venofer -Calcitriol 2.5 mcg PO TIW   Assessment/Plan: 1. 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 Burney Gauze 02/16/21. ABX per primary 2. ESRD -MWF next HD 02/18/21. Planning to d/c home soon but transportation issues, will need HD here today.  3. Hypokalemia: K+ 3.8 today. Given KDUR 40 MEQ X 1 dose yesterday. No 4.0 K baths available D/T [employer]. Will have to follow labs and supplement prn.  3. Anemia - HGB at goal. No ESA needed.  4. Secondary hyperparathyroidism -Calcium is high, will hold VDRA. Continue phosphorus binders.  5. HTN/volume - BP high but improving. Well above outpatient EDW. Continue to titrate down volume as tolerated with dialysis.  6. Nutrition - Low albumin. Start protein supps.  7. DM-per primary 8. H/O seizure disorder-per primary.   Anice Paganini, PA-C 02/18/2021, 9:56 AM  [facility] Pager: [phone]

## 2021-02-18 NOTE — Discharge Summary (Signed)
Physician Discharge Summary  [name] [id number] DOB: 20-Jan-1961 DOA: 02/15/2021  PCP: [name] Mowers, FNP  Admit date: 02/15/2021 Discharge date: 02/18/2021  Admitted From: Home by way of Dr. Bertis Ruddy office Disposition: Home   Recommendations for Outpatient Follow-up:  1. Follow up with PCP in 1-2 weeks 2. Follow up with Dr. Burney Gauze 3/22 for postoperative wound recheck. 3. Please follow up on the following pending results: wound culture (thus far with gram negative rods, reincubated). Pt was discharged on bactrim based on prior susceptibility reports.  Home Health: None new Equipment/Devices: None new Discharge Condition: Stable CODE STATUS: Full Diet recommendation: Renal, heart healthy, carb-modified  Brief/Interim Summary: [name] is a 60 y.o. male with a history of ESRD, T2DM, diabetic neuropathy, severe PVD with multiple amputations among others who presented to Dr. Bertis Ruddy office 3/15 for follow up of ischemic necrosis of hands and was directly admitted to the hospital due to failure of outpatient management, taken for amputations of thumb and 5th digit on the right on 3/16. Gram stain from sample has revealed GPC in pairs, GPR. He has remained afebrile and is stable for discharge on keflex pending final culture data, with plans per Dr. Burney Gauze to follow up in his clinic this coming week.   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 Plan to follow up in Dr. Bertis Ruddy office next week for dressing change.  - Transition vancomycin/zosyn to bactrim based on GNR's growing on wound culture and  history of morganella and proteus susceptible to bactrim and cipro in the past. Final culture results will need to be followed for definitive therapy. If IV treatment required, could be given at hemodialysis, e.g. ceftazidime. This plan of care was discussed with pharmacy and with ID, Dr. Juleen China, on day of discharge. - Continue plavix, statin  T2DM with nephropathy, polyneuropathy uncontrolled with hypoglycemia: Recent HbA1c was 5.1%.  - Stabilized. Will recommend holding insulin until follow up with PCP based on hypoglycemia here despite not taking insulin and very low HbA1c.  ESRD, anemia of CKD:  - Appreciate nephrology assistance, planning HD on scheduled 3/18 (MWF).    Chronic pain: No changes to regimen.   Seizure disorder:  - Continue keppra.   HTN urgency: Improved, will require volume removal w/HD.   Discharge Instructions Discharge Instructions    Diet - low sodium heart healthy   Complete by: As directed    Diet Carb Modified   Complete by: As directed    Discharge instructions   Complete by: As directed    Follow up in Dr. Bertis Ruddy office as directed. If you are not contacted for an appointment please contact the office first thing Monday morning. Your dressing should remain in place until that time.   To treat the infection, bactrim twice daily is recommended for 7 days. This was sent to your pharmacy. The wound culture from the surgery is still pending, so a change to this antibiotic may be required. Please check with Dr. Burney Gauze regarding the status of this culture at follow up or seek medical attention right away if your pain worsens, you notice pus,  other discharge, or foul smell from the wound.  It appears your blood sugar is often going low even without insulin. You should STOP taking insulin at home unless your blood sugar raises above 200. You will need to follow up with your doctor for this.   Leave dressing on - Keep it clean, dry, and intact until clinic  visit   Complete by: As directed      Allergies as of 02/18/2021   No Known Allergies     Medication List    STOP taking these medications   docusate sodium 100 MG capsule Commonly known as: COLACE   Lantus SoloStar 100 UNIT/ML Solostar Pen Generic drug: insulin glargine   polyethylene glycol 17 g packet Commonly known as: MIRALAX / GLYCOLAX     TAKE these medications   acetaminophen 325 MG tablet Commonly known as: TYLENOL Take 2 tablets (650 mg total) by mouth 3 (three) times daily. What changed:   when to take this  reasons to take this   amLODipine 10 MG tablet Commonly known as: NORVASC Take 1 tablet (10 mg total) by mouth daily.   atorvastatin 80 MG tablet Commonly known as: LIPITOR Take 1 tablet (80 mg total) by mouth at bedtime. What changed: when to take this   calcitRIOL 1 MCG/ML injection Commonly known as: CALCIJEX Inject 1.8 mLs (1.8 mcg total) into the vein every Monday, Wednesday, and Friday with hemodialysis.   calcium carbonate (dosed in mg elemental calcium) 1250 MG/5ML Susp Take 5 mLs (500 mg of elemental calcium total) by mouth every 6 (six) hours as needed for indigestion.   carvedilol 12.5 MG tablet Commonly known as: COREG Take 1 tablet (12.5 mg total) by mouth 2 (two) times daily with a meal. Take once on dialysis days What changed:   when to take this  additional instructions   clopidogrel 75 MG tablet Commonly known as: PLAVIX TAKE 1 TABLET (75 MG) BY ORAL ROUTE ONCE DAILY FOR 90 DAYS   Darbepoetin Alfa 200 MCG/0.4ML Sosy injection Commonly known as: ARANESP Inject 0.4 mLs (200 mcg total) into the vein every Monday with hemodialysis. What changed:   when to take this  additional instructions   DIALYVITE 800 WITH ZINC 0.8 MG Tabs Take 1 tablet by mouth every Monday, Wednesday, and Friday with hemodialysis.   DULoxetine 30 MG capsule Commonly known as: CYMBALTA Take 1 capsule (30 mg total) by mouth daily.   famotidine  20 MG tablet Commonly known as: PEPCID Take 20 mg by mouth 2 (two) times daily as needed for heartburn or indigestion.   ferrous sulfate 325 (65 FE) MG EC tablet Take 325 mg by mouth daily with breakfast. What changed: Another medication with the same name was removed. Continue taking this medication, and follow the directions you see here.   hydrALAZINE 25 MG tablet Commonly known as: APRESOLINE Take 3 tablets (75 mg total) by mouth every 8 (eight) hours.   hydrALAZINE 100 MG tablet Commonly known as: APRESOLINE Take 100 mg by mouth every 8 (eight) hours.   ibuprofen 800 MG tablet Commonly known as: ADVIL Take 800 mg by mouth every 6 (six) hours as needed for fever or mild pain.   levETIRAcetam 500 MG tablet Commonly known as: Keppra Take 1000 mg daily and an extra 500 mg on dialysis days What changed:   how much to take  how to take this  when to take this  additional instructions   lidocaine-prilocaine cream Commonly known as: EMLA APPLY SMALL  AMOUNT TO ACCESS SITE (AVF) 3 TIMES A WEEK BEFORE DIALYSIS. COVER WITH OCCLUSIVE DRESSING (SARAN WRAP)   melatonin 1 MG Tabs tablet Take 3 mg by mouth at bedtime as needed (sleep). What changed: Another medication with the same name was removed. Continue taking this medication, and follow the directions you see here.   multivitamin Tabs tablet Take 1 tablet by mouth every Monday, Wednesday, and Friday with hemodialysis.   oxyCODONE 15 MG immediate release tablet Commonly known as: ROXICODONE Take 15 mg by mouth every 6 (six) hours as needed for pain. What changed: Another medication with the same name was removed. Continue taking this medication, and follow the directions you see here.   pregabalin 100 MG capsule Commonly known as: LYRICA Take 1 capsule (100 mg total) by mouth at bedtime. What changed:   when to take this  reasons to take this   senna-docusate 8.6-50 MG tablet Commonly known as: Senokot-S Take 1  tablet by mouth 2 (two) times daily as needed for moderate constipation.   sevelamer carbonate 800 MG tablet Commonly known as: RENVELA Take 4 tablets (3,200 mg total) by mouth 3 (three) times daily with meals.   sulfamethoxazole-trimethoprim 800-160 MG tablet Commonly known as: BACTRIM DS Take 1 tablet by mouth 2 (two) times daily.            Discharge Care Instructions  (From admission, onward)         Start     Ordered   02/18/21 0000  Leave dressing on - Keep it clean, dry, and intact until clinic visit        02/18/21 1156          Follow-up Information    Charlotte Crumb, MD On 02/22/2021.   Specialty: Orthopedic Surgery Why: For wound re-check Contact information: [place] [address] [phone]              No Known Allergies  Consultations:  Nephrology  Dr. Burney Gauze  ID  Procedures/Studies: EEG  Result Date: 01/22/2021 Lora Havens, MD     01/22/2021  1:48 PM Patient Name: ZYREE WERNIMONT MRN: [id number] Epilepsy Attending: Lora Havens Referring Physician/Provider: Dr Roanna Banning Date: 01/22/2021 Duration: 23.18 mins Patient history: 60yo M with new onset seizures in the setting of dialysis. EEG to evaluate for seizure Level of alertness: Awake AEDs during EEG study: LEV Technical aspects: This EEG study was done with scalp electrodes positioned according to the 10-20 International system of electrode placement. Electrical activity was acquired at a sampling rate of '500Hz'$  and reviewed with a high frequency filter of '70Hz'$  and a low frequency filter of '1Hz'$ . EEG data were recorded continuously and digitally stored. Description: No posterior dominant rhythm was seen. EEG showed continuous generalized 5 to 6 Hz theta as well as intermittent 2-'3Hz'$  delta slowing. Intermittent generalized periodic discharges with triphasic morphology at 1-1.'5hz'$  were also noted. Hyperventilation and photic stimulation were not performed.    ABNORMALITY -Continuous slow, generalized -Periodic discharges with triphasic morphology, generalized IMPRESSION: This study is suggestive of moderate diffuse encephalopathy, nonspecific etiology but likely related to toxic-metabolic causes. No seizures or definite epileptiform discharges were seen throughout the recording. Lora Havens   CT Head Wo Contrast  Result Date: 01/21/2021 CLINICAL DATA:  Seizure today. EXAM: CT HEAD WITHOUT CONTRAST TECHNIQUE: Contiguous axial images were obtained from the base of the skull through the vertex without intravenous contrast. COMPARISON:  Head CT scan 11/24/2020. FINDINGS: Brain: No evidence of acute  infarction, hemorrhage, hydrocephalus, extra-axial collection or mass lesion/mass effect. 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 Vascular: No hyperdense vessel or unexpected calcification. Skull: Normal. Negative for fracture or focal lesion. Sinuses/Orbits: Negative. Other: None. IMPRESSION: No acute abnormality.  Stable compared to prior exam. Electronically Signed   By: Inge Rise M.D.   On: 01/21/2021 15:29   MR BRAIN WO CONTRAST  Result Date: 01/22/2021 CLINICAL DATA:  New onset seizure activity at dialysis. EXAM: MRI HEAD WITHOUT CONTRAST TECHNIQUE: Multiplanar, multiecho pulse sequences of the brain and surrounding structures were obtained without intravenous contrast. COMPARISON:  Head CT 01/21/2021 FINDINGS: Brain: No acute infarct, mass, midline shift, or extra-axial fluid collection is identified. There is a single chronic microhemorrhage in the right occipital lobe. T2 hyperintensities in the cerebral white matter bilaterally are nonspecific but compatible with mild chronic small vessel ischemic disease. Small chronic infarcts are again noted in the anterior parasagittal right frontal lobe and left cerebellum a. there is mild cerebral atrophy with asymmetric ex vacuo dilatation of the atrium and occipital horn of the  right lateral ventricle. Dedicated temporal lobe imaging is motion degraded, however the hippocampi are grossly symmetric in size and signal. Vascular: Abnormal appearance of the distal left vertebral artery. Other major intracranial vascular flow voids are preserved. Skull and upper cervical spine: Unremarkable bone marrow signal para Sinuses/Orbits: Unremarkable orbits. Minimal mucosal thickening in the paranasal sinuses. Clear mastoid air cells. Other: None. IMPRESSION: 1. No acute intracranial abnormality. 2. Mild chronic small vessel ischemic disease and cerebral atrophy. 3. 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 4. Abnormal appearance of the distal left vertebral artery which may reflect occlusion or slow flow. Electronically Signed   By: Logan Bores M.D.   On: 01/22/2021 16:57   DG Hand 2 View Right  Result Date: 02/15/2021 CLINICAL DATA:  Ischemic necrosis. EXAM: RIGHT HAND - 2 VIEW COMPARISON:  Radiograph 03/18/2020 FINDINGS: Remote trans metacarpal amputation of the second ray. Trans metacarpal amputation of the third and fourth rays since prior radiograph. Fifth digit is held in flexion on both views. Fifth digit soft tissues are atrophic. Soft tissue gas noted about the thumb interphalangeal joint. No evidence of bony destruction. Advanced vascular calcifications. IMPRESSION: 1. Soft tissue gas about the thumb interphalangeal joint suspicious for gangrene. 2. Soft tissue atrophy of the fifth digit. 3. Trans metacarpal amputation of the second through fourth ray. 4. Advanced vascular calcifications. Electronically Signed   By: Keith Rake M.D.   On: 02/15/2021 20:25   DG Hand 2 View Left  Result Date: 02/15/2021 CLINICAL DATA:  Ischemic necrosis. EXAM: LEFT HAND - 2 VIEW COMPARISON:  Radiograph 09/06/2020 FINDINGS: Interval resection of the second digit. There are erosions involving the second metacarpal head. Soft tissues of the third, fourth, and fifth digits are  atrophic. The third through fifth digits are held in flexion at the proximal interphalangeal joint on both views. No erosions or bony destruction involving the third through fifth digits. Advanced vascular calcifications. Suspect linear soft tissue air involving the distal third digit volar surface. IMPRESSION: 1. Resection of the second digit. Erosions involving the second metacarpal head suspicious for osteomyelitis. 2. Soft tissue atrophy of the third, fourth, and fifth digits. Suspect linear soft tissue air involving the distal third digit volar surface. Electronically Signed   By: Keith Rake M.D.   On: 02/15/2021 20:28   DG KNEE 3 VIEW RIGHT  Result Date: 01/21/2021 CLINICAL DATA:  Pressure ulcer. EXAM: RIGHT  KNEE - 3 VIEW COMPARISON:  None. FINDINGS: There is evidence of a right-sided below the knee amputation. No evidence of fracture, dislocation, or joint effusion. No evidence of arthropathy or other focal bone abnormality. Marked severity vascular calcification is seen. A small, ill-defined anterior superficial soft tissue defect is seen adjacent to the inferior aspect of the right patella. IMPRESSION: 1. No acute osseous abnormality. 2. Status post right below the knee amputation. 3. Small superficial soft tissue defect adjacent to the inferior aspect of the right patella. Electronically Signed   By: Virgina Norfolk M.D.   On: 01/21/2021 20:51     Subjective: Pain controlled, no fever, feels ready to go home. No new complaints.   Discharge Exam: Vitals:   02/18/21 0518 02/18/21 1014  BP: (!) 151/42 (!) 156/45  Pulse: 81 85  Resp: 18 17  Temp: 98.5 F (36.9 C) 98.1 F (36.7 C)  SpO2: 98% 100%   General: Pt is alert, awake, not in acute distress Cardiovascular: RRR, S1/S2 +, no rubs, no gallops Respiratory: CTA bilaterally, no wheezing, no rhonchi Abdominal: Soft, NT, ND, bowel sounds + Extremities: No new deformities. BL BKAs, left 2st digit amputation and other digits with  ischemic dry gangrene. Right hand with ACE wrap, warm, dry, and clean.   Labs: BNP (last 3 results) Recent Labs    03/14/20 0812 03/15/20 0438 03/16/20 0436  BNP 344.0* 246.0* [id number]*   Basic Metabolic Panel: Recent Labs  Lab 02/15/21 1713 02/16/21 0144 02/18/21 0216  NA 136 135 137  K 3.0* 2.9* 3.8  CL 94* 93* 96*  CO2 '27 26 28  '$ GLUCOSE 92 99 93  BUN 40* 44* 33*  CREATININE 8.27* 8.72* 8.09*  CALCIUM 9.3 9.2 9.7  PHOS  --  6.6* 6.3*   Liver Function Tests: Recent Labs  Lab 02/15/21 1713 02/16/21 0144 02/18/21 0216  AST 15  --   --   ALT 10  --   --   ALKPHOS 62  --   --   BILITOT 1.0  --   --   PROT 8.0  --   --   ALBUMIN 2.9* 2.6* 2.5*   No results for input(s): LIPASE, AMYLASE in the last 168 hours. No results for input(s): AMMONIA in the last 168 hours. CBC: Recent Labs  Lab 02/15/21 1827 02/16/21 2016 02/18/21 0216  WBC 13.5* 12.7* 13.3*  NEUTROABS 10.7*  --  10.0*  HGB 11.4* 10.3* 10.4*  HCT 36.3* 32.7* 33.8*  MCV 70.8* 70.6* 71.9*  PLT 250 240 235   Cardiac Enzymes: No results for input(s): CKTOTAL, CKMB, CKMBINDEX, TROPONINI in the last 168 hours. BNP: Invalid input(s): POCBNP CBG: Recent Labs  Lab 02/17/21 1107 02/17/21 1703 02/17/21 2147 02/18/21 0630 02/18/21 1150  GLUCAP 111* 88 100* 74 96   D-Dimer No results for input(s): DDIMER in the last 72 hours. Hgb A1c No results for input(s): HGBA1C in the last 72 hours. Lipid Profile No results for input(s): CHOL, HDL, LDLCALC, TRIG, CHOLHDL, LDLDIRECT in the last 72 hours. Thyroid function studies No results for input(s): TSH, T4TOTAL, T3FREE, THYROIDAB in the last 72 hours.  Invalid input(s): FREET3 Anemia work up No results for input(s): VITAMINB12, FOLATE, FERRITIN, TIBC, IRON, RETICCTPCT in the last 72 hours. Urinalysis    Component Value Date/Time   COLORURINE YELLOW 09/10/2018 1927   APPEARANCEUR HAZY (A) 09/10/2018 1927   LABSPEC 1.011 09/10/2018 1927   PHURINE 5.0  09/10/2018 1927   GLUCOSEU 50 (A) 09/10/2018 1927  HGBUR MODERATE (A) 09/10/2018 1927   BILIRUBINUR NEGATIVE 09/10/2018 1927   KETONESUR NEGATIVE 09/10/2018 1927   PROTEINUR 100 (A) 09/10/2018 1927   NITRITE NEGATIVE 09/10/2018 1927   LEUKOCYTESUR NEGATIVE 09/10/2018 1927    Microbiology Recent Results (from the past 240 hour(s))  SARS CORONAVIRUS 2 (TAT 6-24 HRS) Nasopharyngeal Nasopharyngeal Swab     Status: None   Collection Time: 02/15/21  4:00 PM   Specimen: Nasopharyngeal Swab  Result Value Ref Range Status   SARS Coronavirus 2 NEGATIVE NEGATIVE Final    Comment: (NOTE) SARS-CoV-2 target nucleic acids are NOT DETECTED.  The SARS-CoV-2 RNA is generally detectable in upper and lower respiratory specimens during the acute phase of infection. Negative results do not preclude SARS-CoV-2 infection, do not rule out co-infections with other pathogens, and should not be used as the sole basis for treatment or other patient management decisions. Negative results must be combined with clinical observations, patient history, and epidemiological information. The expected result is Negative.  Fact Sheet for Patients: SugarRoll.be  Fact Sheet for Healthcare Providers: https://www.woods-mathews.com/  This test is not yet approved or cleared by the [place] FDA and  has been authorized for detection and/or diagnosis of SARS-CoV-2 by FDA under an Emergency Use Authorization (EUA). This EUA will remain  in effect (meaning this test can be used) for the duration of the COVID-19 declaration under Se ction 564(b)(1) of the Act, 21 U.S.C. section 360bbb-3(b)(1), unless the authorization is terminated or revoked sooner.  Performed at [facility], [place] [address] [place], [place] [address]   Surgical pcr screen     Status: None   Collection Time: 02/15/21  4:30 PM   Specimen: Nasopharyngeal Swab; Nasal Swab  Result Value Ref Range  Status   MRSA, PCR NEGATIVE NEGATIVE Final   Staphylococcus aureus NEGATIVE NEGATIVE Final    Comment: (NOTE) The Xpert SA Assay (FDA approved for NASAL specimens in patients 19 years of age and older), is one component of a comprehensive surveillance program. It is not intended to diagnose infection nor to guide or monitor treatment. Performed at [facility], [place] [address] [place], [place] [address]   Culture, blood (routine x 2)     Status: None (Preliminary result)   Collection Time: 02/15/21  5:13 PM   Specimen: BLOOD  Result Value Ref Range Status   Specimen Description BLOOD SITE NOT SPECIFIED  Final   Special Requests   Final    BOTTLES DRAWN AEROBIC AND ANAEROBIC Blood Culture adequate volume   Culture   Final    NO GROWTH 3 DAYS Performed at [facility], [address] [place], [place] [address]    Report Status PENDING  Incomplete  Culture, blood (routine x 2)     Status: None (Preliminary result)   Collection Time: 02/15/21  5:13 PM   Specimen: BLOOD  Result Value Ref Range Status   Specimen Description BLOOD SITE NOT SPECIFIED  Final   Special Requests   Final    BOTTLES DRAWN AEROBIC AND ANAEROBIC Blood Culture adequate volume   Culture   Final    NO GROWTH 3 DAYS Performed at [facility], [address] [place], [place] [address]    Report Status PENDING  Incomplete  Aerobic/Anaerobic Culture w Gram Stain (surgical/deep wound)     Status: None (Preliminary result)   Collection Time: 02/16/21  3:38 PM   Specimen: Wound  Result Value Ref Range Status   Specimen  Description WOUND  Final   Special Requests RIGHT THUMB SPEC A  Final   Gram Stain   Final    RARE WBC PRESENT,BOTH PMN AND MONONUCLEAR FEW GRAM POSITIVE COCCI IN PAIRS FEW GRAM POSITIVE RODS    Culture   Final    MODERATE GRAM NEGATIVE RODS IDENTIFICATION TO FOLLOW CULTURE REINCUBATED FOR BETTER GROWTH Performed at [facility], [place] [address], [place], [place]  [address]    Report Status PENDING  Incomplete    Time coordinating discharge: Approximately 40 minutes  Patrecia Pour, MD  [facility] 02/18/2021, 12:01 PM

## 2021-02-18 NOTE — Care Management Important Message (Signed)
Important Message  Patient Details  Name: [name] MRN: [id number] Date of Birth: Jun 20, 1961   Medicare Important Message Given:  Yes - Important Message mailed due to current National Emergency   Verbal consent obtained due to current National Emergency  Relationship to patient: Self Contact Name: Branko Call Date: 02/18/21  Time: 0929 Phone: [phone] Outcome: No Answer/Busy Important Message mailed to: Patient address on file    Delorse Lek 02/18/2021, 9:30 AM

## 2021-02-18 NOTE — Progress Notes (Addendum)
Pharmacy Antibiotic Note  [name] is a 60 y.o. male with ESRD (on HD, MWF), diabetes, PVD (S/P multiple amputations), hx of osteomyelitis admitted on 02/15/2021 with swelling and pain in R hand and ischemic necrosis and dry gangrene of both hands.  Pharmacy has been consulted for vancomycin dosing for ischemic necrosis.  Pt is afebrile; admission labs pending; last HD session ended on 3/17 (once dose given after completion of session). Next HD on 3/18?  Wound culture from 3/16 growing GPC in pairs and GPR. D/W Primary MD, would suggest deescalation by d/c zosyn and change to cefazolin IV and then PO keflex '500mg'$  Po daily at discharge  Plan: D/C Vancomycin D/C Zosyn Cefazolin 2gm IV qMWF after HD Pharmacy will sign off Monitor WBC, temp, clinical improvement   Height:  (uknown) Weight: 79.5 kg (175 lb 4.3 oz)  Temp (24hrs), Avg:98.4 F (36.9 C), Min:98.3 F (36.8 C), Max:98.5 F (36.9 C)  Recent Labs  Lab 02/15/21 1713 02/15/21 1827 02/16/21 0144 02/16/21 2016 02/18/21 0216  WBC  --  13.5*  --  12.7* 13.3*  CREATININE 8.27*  --  8.72*  --  8.09*    Estimated Creatinine Clearance: 10.8 mL/min (A) (by C-G formula based on SCr of 8.09 mg/dL (H)).    No Known Allergies  Antimicrobials this admission: 3/15 Vancomycin >>3/18 3/15 Zosyn >>3/18 3/18 Cefazolin >>  Microbiology results: 3/15 Bld cx: NGTD 3/16: wound cultures: GPC pairs and GPR  [name], PharmD, BCPS, FNKF Clinical Pharmacist [place] Please utilize Amion for appropriate phone number to reach the unit pharmacist ([place])   02/18/2021 8:36 AM

## 2021-02-18 NOTE — Progress Notes (Signed)
Renal Navigator learned in progression that patient will be discharged today. Today is patient's HD day. Navigator contacted patient's outpatient clinic/[facility], who can accommodate him anytime as long as he arrives by noon today (regular seat time is right now). Navigator met with patient to see how he is feeling and see about arranging outpatient HD today after discharge now. He states, "that's too much." He reports that he will wait on HD here, no matter when this will be, as this was not pre-planned and his wife cannot come get him right now to take him to his clinic. Navigator was not notified of discharge before this morning and cannot arrange other transportation at this time, as patient requires wheelchair transport. He also does not have his wheelchair here with him. Navigator asked HD unit to get patient next for discharge after. Patient states that his wife will come get him after HD here later today.  Alphonzo Cruise, [facility]al Navigator [phone]

## 2021-02-21 ENCOUNTER — Other Ambulatory Visit: Payer: Self-pay | Admitting: Family Medicine

## 2021-02-21 LAB — AEROBIC/ANAEROBIC CULTURE W GRAM STAIN (SURGICAL/DEEP WOUND)

## 2021-02-21 LAB — SURGICAL PATHOLOGY

## 2021-02-21 MED ORDER — LEVOFLOXACIN 250 MG PO TABS: 250.0000 mg | ORAL_TABLET | Freq: Every day | ORAL | 0 refills | Status: AC

## 2021-02-21 MED ORDER — METRONIDAZOLE 500 MG PO TABS: 500.0000 mg | ORAL_TABLET | Freq: Three times a day (TID) | ORAL | 0 refills | Status: AC

## 2021-02-21 NOTE — Progress Notes (Signed)
Wound culture results returned after patient discharged. Polymicrobial, MDR. I spoke with ID, Dr. Baxter Flattery who reviewed the case. Recommended Tx with levofloxacin '250mg'$  daily and flagyl '500mg'$  TID for 7 days. I have called the patient to check on him as bactrim would not have been effective against all organisms present in culture. He is struggling in that he has very limited use of his hands. He still needs to follow up with Dr. Burney Gauze. Pain is controlled, he doesn't feel like her has a fever. I believe he has a high risk of not healing surgical wounds based on vasculopathy if there is any infection present, so I have called these prescriptions into the patient's pharmacy.   Vance Gather, MD 02/21/2021 3:40 PM

## 2021-02-22 ENCOUNTER — Other Ambulatory Visit: Payer: Self-pay

## 2021-02-22 DIAGNOSIS — I739 Peripheral vascular disease, unspecified: Secondary | ICD-10-CM

## 2021-02-22 LAB — CULTURE, BLOOD (ROUTINE X 2)
Culture: NO GROWTH
Culture: NO GROWTH
Special Requests: ADEQUATE
Special Requests: ADEQUATE

## 2021-02-24 ENCOUNTER — Ambulatory Visit (HOSPITAL_COMMUNITY): Payer: Medicare Other | Attending: Vascular Surgery

## 2021-02-24 ENCOUNTER — Ambulatory Visit (HOSPITAL_COMMUNITY): Payer: Medicare Other

## 2021-02-28 ENCOUNTER — Encounter: Payer: Self-pay | Admitting: Vascular Surgery

## 2021-04-03 ENCOUNTER — Inpatient Hospital Stay (HOSPITAL_COMMUNITY): Payer: Medicare Other

## 2021-04-03 ENCOUNTER — Emergency Department (HOSPITAL_COMMUNITY): Payer: Medicare Other

## 2021-04-03 ENCOUNTER — Inpatient Hospital Stay (HOSPITAL_COMMUNITY)
Admission: EM | Admit: 2021-04-03 | Discharge: 2021-05-04 | DRG: 308 | Disposition: E | Payer: Medicare Other | Attending: Pulmonary Disease | Admitting: Pulmonary Disease

## 2021-04-03 ENCOUNTER — Encounter (HOSPITAL_COMMUNITY): Payer: Self-pay | Admitting: Internal Medicine

## 2021-04-03 DIAGNOSIS — K9420 Gastrostomy complication, unspecified: Secondary | ICD-10-CM

## 2021-04-03 DIAGNOSIS — J9601 Acute respiratory failure with hypoxia: Secondary | ICD-10-CM | POA: Diagnosis not present

## 2021-04-03 DIAGNOSIS — I739 Peripheral vascular disease, unspecified: Secondary | ICD-10-CM

## 2021-04-03 DIAGNOSIS — I472 Ventricular tachycardia: Secondary | ICD-10-CM | POA: Diagnosis not present

## 2021-04-03 DIAGNOSIS — I469 Cardiac arrest, cause unspecified: Principal | ICD-10-CM

## 2021-04-03 DIAGNOSIS — N186 End stage renal disease: Secondary | ICD-10-CM

## 2021-04-03 DIAGNOSIS — J9602 Acute respiratory failure with hypercapnia: Secondary | ICD-10-CM

## 2021-04-03 DIAGNOSIS — I10 Essential (primary) hypertension: Secondary | ICD-10-CM

## 2021-04-03 DIAGNOSIS — L8990 Pressure ulcer of unspecified site, unspecified stage: Secondary | ICD-10-CM | POA: Insufficient documentation

## 2021-04-03 DIAGNOSIS — Z992 Dependence on renal dialysis: Secondary | ICD-10-CM

## 2021-04-03 DIAGNOSIS — E440 Moderate protein-calorie malnutrition: Secondary | ICD-10-CM | POA: Insufficient documentation

## 2021-04-03 DIAGNOSIS — E872 Acidosis, unspecified: Secondary | ICD-10-CM

## 2021-04-03 DIAGNOSIS — R569 Unspecified convulsions: Secondary | ICD-10-CM

## 2021-04-03 DIAGNOSIS — Z89512 Acquired absence of left leg below knee: Secondary | ICD-10-CM

## 2021-04-03 DIAGNOSIS — I493 Ventricular premature depolarization: Secondary | ICD-10-CM | POA: Diagnosis present

## 2021-04-03 DIAGNOSIS — I82621 Acute embolism and thrombosis of deep veins of right upper extremity: Secondary | ICD-10-CM | POA: Diagnosis not present

## 2021-04-03 DIAGNOSIS — E874 Mixed disorder of acid-base balance: Secondary | ICD-10-CM | POA: Diagnosis present

## 2021-04-03 DIAGNOSIS — N2581 Secondary hyperparathyroidism of renal origin: Secondary | ICD-10-CM | POA: Diagnosis present

## 2021-04-03 DIAGNOSIS — Z7189 Other specified counseling: Secondary | ICD-10-CM | POA: Diagnosis not present

## 2021-04-03 DIAGNOSIS — Z8249 Family history of ischemic heart disease and other diseases of the circulatory system: Secondary | ICD-10-CM

## 2021-04-03 DIAGNOSIS — Z833 Family history of diabetes mellitus: Secondary | ICD-10-CM

## 2021-04-03 DIAGNOSIS — E875 Hyperkalemia: Secondary | ICD-10-CM | POA: Diagnosis not present

## 2021-04-03 DIAGNOSIS — Z20822 Contact with and (suspected) exposure to covid-19: Secondary | ICD-10-CM | POA: Diagnosis present

## 2021-04-03 DIAGNOSIS — Z66 Do not resuscitate: Secondary | ICD-10-CM | POA: Diagnosis not present

## 2021-04-03 DIAGNOSIS — J690 Pneumonitis due to inhalation of food and vomit: Secondary | ICD-10-CM | POA: Diagnosis present

## 2021-04-03 DIAGNOSIS — G40909 Epilepsy, unspecified, not intractable, without status epilepticus: Secondary | ICD-10-CM | POA: Diagnosis present

## 2021-04-03 DIAGNOSIS — E1151 Type 2 diabetes mellitus with diabetic peripheral angiopathy without gangrene: Secondary | ICD-10-CM | POA: Diagnosis present

## 2021-04-03 DIAGNOSIS — L89322 Pressure ulcer of left buttock, stage 2: Secondary | ICD-10-CM | POA: Diagnosis not present

## 2021-04-03 DIAGNOSIS — Z515 Encounter for palliative care: Secondary | ICD-10-CM

## 2021-04-03 DIAGNOSIS — Z7902 Long term (current) use of antithrombotics/antiplatelets: Secondary | ICD-10-CM

## 2021-04-03 DIAGNOSIS — E785 Hyperlipidemia, unspecified: Secondary | ICD-10-CM | POA: Diagnosis present

## 2021-04-03 DIAGNOSIS — R609 Edema, unspecified: Secondary | ICD-10-CM | POA: Diagnosis not present

## 2021-04-03 DIAGNOSIS — I120 Hypertensive chronic kidney disease with stage 5 chronic kidney disease or end stage renal disease: Secondary | ICD-10-CM | POA: Diagnosis present

## 2021-04-03 DIAGNOSIS — Z79899 Other long term (current) drug therapy: Secondary | ICD-10-CM

## 2021-04-03 DIAGNOSIS — I959 Hypotension, unspecified: Secondary | ICD-10-CM | POA: Diagnosis not present

## 2021-04-03 DIAGNOSIS — I462 Cardiac arrest due to underlying cardiac condition: Secondary | ICD-10-CM | POA: Diagnosis present

## 2021-04-03 DIAGNOSIS — E1122 Type 2 diabetes mellitus with diabetic chronic kidney disease: Secondary | ICD-10-CM | POA: Diagnosis present

## 2021-04-03 DIAGNOSIS — Z89011 Acquired absence of right thumb: Secondary | ICD-10-CM

## 2021-04-03 DIAGNOSIS — I82611 Acute embolism and thrombosis of superficial veins of right upper extremity: Secondary | ICD-10-CM | POA: Diagnosis present

## 2021-04-03 DIAGNOSIS — Z89511 Acquired absence of right leg below knee: Secondary | ICD-10-CM

## 2021-04-03 DIAGNOSIS — G40409 Other generalized epilepsy and epileptic syndromes, not intractable, without status epilepticus: Secondary | ICD-10-CM

## 2021-04-03 DIAGNOSIS — Z89022 Acquired absence of left finger(s): Secondary | ICD-10-CM

## 2021-04-03 DIAGNOSIS — G931 Anoxic brain damage, not elsewhere classified: Secondary | ICD-10-CM | POA: Diagnosis not present

## 2021-04-03 DIAGNOSIS — B1920 Unspecified viral hepatitis C without hepatic coma: Secondary | ICD-10-CM | POA: Diagnosis present

## 2021-04-03 DIAGNOSIS — G9340 Encephalopathy, unspecified: Secondary | ICD-10-CM

## 2021-04-03 DIAGNOSIS — Z89021 Acquired absence of right finger(s): Secondary | ICD-10-CM

## 2021-04-03 DIAGNOSIS — D631 Anemia in chronic kidney disease: Secondary | ICD-10-CM | POA: Diagnosis present

## 2021-04-03 DIAGNOSIS — L89312 Pressure ulcer of right buttock, stage 2: Secondary | ICD-10-CM | POA: Diagnosis not present

## 2021-04-03 DIAGNOSIS — Z9115 Patient's noncompliance with renal dialysis: Secondary | ICD-10-CM

## 2021-04-03 DIAGNOSIS — I161 Hypertensive emergency: Secondary | ICD-10-CM | POA: Diagnosis present

## 2021-04-03 DIAGNOSIS — E8889 Other specified metabolic disorders: Secondary | ICD-10-CM | POA: Diagnosis present

## 2021-04-03 DIAGNOSIS — I4901 Ventricular fibrillation: Secondary | ICD-10-CM | POA: Diagnosis present

## 2021-04-03 HISTORY — DX: Dependence on renal dialysis: N18.6

## 2021-04-03 HISTORY — DX: Essential (primary) hypertension: I10

## 2021-04-03 HISTORY — DX: Dependence on renal dialysis: Z99.2

## 2021-04-03 HISTORY — DX: Type 2 diabetes mellitus without complications: E11.9

## 2021-04-03 HISTORY — DX: Peripheral vascular disease, unspecified: I73.9

## 2021-04-03 HISTORY — DX: Hyperlipidemia, unspecified: E78.5

## 2021-04-03 HISTORY — DX: End stage renal disease: N18.6

## 2021-04-03 LAB — POCT I-STAT 7, (LYTES, BLD GAS, ICA,H+H)
Acid-Base Excess: 0 mmol/L (ref 0.0–2.0)
Acid-base deficit: 2 mmol/L (ref 0.0–2.0)
Bicarbonate: 19.4 mmol/L — ABNORMAL LOW (ref 20.0–28.0)
Bicarbonate: 21.2 mmol/L (ref 20.0–28.0)
Calcium, Ion: 0.92 mmol/L — ABNORMAL LOW (ref 1.15–1.40)
Calcium, Ion: 0.93 mmol/L — ABNORMAL LOW (ref 1.15–1.40)
HCT: 30 % — ABNORMAL LOW (ref 39.0–52.0)
HCT: 28 % — ABNORMAL LOW (ref 39.0–52.0)
Hemoglobin: 10.2 g/dL — ABNORMAL LOW (ref 13.0–17.0)
Hemoglobin: 9.5 g/dL — ABNORMAL LOW (ref 13.0–17.0)
O2 Saturation: 100 %
O2 Saturation: 100 %
Patient temperature: 37.1
Patient temperature: 36.9
Potassium: 3.8 mmol/L (ref 3.5–5.1)
Potassium: 3.9 mmol/L (ref 3.5–5.1)
Sodium: 139 mmol/L (ref 135–145)
Sodium: 140 mmol/L (ref 135–145)
TCO2: 20 mmol/L — ABNORMAL LOW (ref 22–32)
TCO2: 22 mmol/L (ref 22–32)
pCO2 arterial: 18.1 mmHg — CL (ref 32.0–48.0)
pCO2 arterial: 31.7 mmHg — ABNORMAL LOW (ref 32.0–48.0)
pH, Arterial: 7.64 (ref 7.350–7.450)
pH, Arterial: 7.434 (ref 7.350–7.450)
pO2, Arterial: 191 mmHg — ABNORMAL HIGH (ref 83.0–108.0)
pO2, Arterial: 186 mmHg — ABNORMAL HIGH (ref 83.0–108.0)

## 2021-04-03 LAB — CBC WITH DIFFERENTIAL/PLATELET
Abs Immature Granulocytes: 0.2 10*3/uL — ABNORMAL HIGH (ref 0.00–0.07)
Basophils Absolute: 0.1 10*3/uL (ref 0.0–0.1)
Basophils Relative: 1 %
Eosinophils Absolute: 0.3 10*3/uL (ref 0.0–0.5)
Eosinophils Relative: 2 %
HCT: 34.3 % — ABNORMAL LOW (ref 39.0–52.0)
Hemoglobin: 9.5 g/dL — ABNORMAL LOW (ref 13.0–17.0)
Immature Granulocytes: 2 %
Lymphocytes Relative: 36 %
Lymphs Abs: 4.9 10*3/uL — ABNORMAL HIGH (ref 0.7–4.0)
MCH: 22.7 pg — ABNORMAL LOW (ref 26.0–34.0)
MCHC: 27.7 g/dL — ABNORMAL LOW (ref 30.0–36.0)
MCV: 81.9 fL (ref 80.0–100.0)
Monocytes Absolute: 1.1 10*3/uL — ABNORMAL HIGH (ref 0.1–1.0)
Monocytes Relative: 8 %
Neutro Abs: 7 10*3/uL (ref 1.7–7.7)
Neutrophils Relative %: 51 %
Platelets: 244 10*3/uL (ref 150–400)
RBC: 4.19 MIL/uL — ABNORMAL LOW (ref 4.22–5.81)
RDW: 23.9 % — ABNORMAL HIGH (ref 11.5–15.5)
WBC: 13.6 10*3/uL — ABNORMAL HIGH (ref 4.0–10.5)
nRBC: 0.3 % — ABNORMAL HIGH (ref 0.0–0.2)

## 2021-04-03 LAB — I-STAT ARTERIAL BLOOD GAS, ED
Acid-base deficit: 9 mmol/L — ABNORMAL HIGH (ref 0.0–2.0)
Bicarbonate: 19.4 mmol/L — ABNORMAL LOW (ref 20.0–28.0)
Calcium, Ion: 1.16 mmol/L (ref 1.15–1.40)
HCT: 35 % — ABNORMAL LOW (ref 39.0–52.0)
Hemoglobin: 11.9 g/dL — ABNORMAL LOW (ref 13.0–17.0)
O2 Saturation: 99 %
Patient temperature: 98.3
Potassium: 3.8 mmol/L (ref 3.5–5.1)
Sodium: 139 mmol/L (ref 135–145)
TCO2: 21 mmol/L — ABNORMAL LOW (ref 22–32)
pCO2 arterial: 53.4 mmHg — ABNORMAL HIGH (ref 32.0–48.0)
pH, Arterial: 7.168 — CL (ref 7.350–7.450)
pO2, Arterial: 181 mmHg — ABNORMAL HIGH (ref 83.0–108.0)

## 2021-04-03 LAB — I-STAT CHEM 8, ED
BUN: 65 mg/dL — ABNORMAL HIGH (ref 8–23)
Calcium, Ion: 1.29 mmol/L (ref 1.15–1.40)
Chloride: 103 mmol/L (ref 98–111)
Creatinine, Ser: 9.7 mg/dL — ABNORMAL HIGH (ref 0.61–1.24)
Glucose, Bld: 226 mg/dL — ABNORMAL HIGH (ref 70–99)
HCT: 34 % — ABNORMAL LOW (ref 39.0–52.0)
Hemoglobin: 11.6 g/dL — ABNORMAL LOW (ref 13.0–17.0)
Potassium: 3.9 mmol/L (ref 3.5–5.1)
Sodium: 139 mmol/L (ref 135–145)
TCO2: 23 mmol/L (ref 22–32)

## 2021-04-03 LAB — BASIC METABOLIC PANEL
Anion gap: 22 — ABNORMAL HIGH (ref 5–15)
Anion gap: 23 — ABNORMAL HIGH (ref 5–15)
BUN: 60 mg/dL — ABNORMAL HIGH (ref 8–23)
BUN: 70 mg/dL — ABNORMAL HIGH (ref 8–23)
CO2: 19 mmol/L — ABNORMAL LOW (ref 22–32)
CO2: 18 mmol/L — ABNORMAL LOW (ref 22–32)
Calcium: 8.1 mg/dL — ABNORMAL LOW (ref 8.9–10.3)
Calcium: 7.8 mg/dL — ABNORMAL LOW (ref 8.9–10.3)
Chloride: 98 mmol/L (ref 98–111)
Chloride: 98 mmol/L (ref 98–111)
Creatinine, Ser: 9.61 mg/dL — ABNORMAL HIGH (ref 0.61–1.24)
Creatinine, Ser: 9.79 mg/dL — ABNORMAL HIGH (ref 0.61–1.24)
GFR, Estimated: 6 mL/min — ABNORMAL LOW (ref >60)
GFR, Estimated: 6 mL/min — ABNORMAL LOW (ref >60)
Glucose, Bld: 205 mg/dL — ABNORMAL HIGH (ref 70–99)
Glucose, Bld: 83 mg/dL (ref 70–99)
Potassium: 3.7 mmol/L (ref 3.5–5.1)
Potassium: 4.7 mmol/L (ref 3.5–5.1)
Sodium: 139 mmol/L (ref 135–145)
Sodium: 139 mmol/L (ref 135–145)

## 2021-04-03 LAB — PROTIME-INR
INR: 1.2 (ref 0.8–1.2)
Prothrombin Time: 15.2 seconds (ref 11.4–15.2)

## 2021-04-03 LAB — CBC
HCT: 33.2 % — ABNORMAL LOW (ref 39.0–52.0)
Hemoglobin: 9.9 g/dL — ABNORMAL LOW (ref 13.0–17.0)
MCH: 22.4 pg — ABNORMAL LOW (ref 26.0–34.0)
MCHC: 29.8 g/dL — ABNORMAL LOW (ref 30.0–36.0)
MCV: 75.3 fL — ABNORMAL LOW (ref 80.0–100.0)
Platelets: 245 10*3/uL (ref 150–400)
RBC: 4.41 MIL/uL (ref 4.22–5.81)
RDW: 22.9 % — ABNORMAL HIGH (ref 11.5–15.5)
WBC: 11.8 10*3/uL — ABNORMAL HIGH (ref 4.0–10.5)
nRBC: 0.3 % — ABNORMAL HIGH (ref 0.0–0.2)

## 2021-04-03 LAB — COMPREHENSIVE METABOLIC PANEL
ALT: 9 U/L (ref 0–44)
AST: 23 U/L (ref 15–41)
Albumin: 2.4 g/dL — ABNORMAL LOW (ref 3.5–5.0)
Alkaline Phosphatase: 54 U/L (ref 38–126)
Anion gap: 23 — ABNORMAL HIGH (ref 5–15)
BUN: 55 mg/dL — ABNORMAL HIGH (ref 8–23)
CO2: 21 mmol/L — ABNORMAL LOW (ref 22–32)
Calcium: 10.3 mg/dL (ref 8.9–10.3)
Chloride: 97 mmol/L — ABNORMAL LOW (ref 98–111)
Creatinine, Ser: 9.45 mg/dL — ABNORMAL HIGH (ref 0.61–1.24)
GFR, Estimated: 6 mL/min — ABNORMAL LOW (ref >60)
Glucose, Bld: 240 mg/dL — ABNORMAL HIGH (ref 70–99)
Potassium: 4.2 mmol/L (ref 3.5–5.1)
Sodium: 141 mmol/L (ref 135–145)
Total Bilirubin: 0.8 mg/dL (ref 0.3–1.2)
Total Protein: 6.4 g/dL — ABNORMAL LOW (ref 6.5–8.1)

## 2021-04-03 LAB — GLUCOSE, CAPILLARY
Glucose-Capillary: 184 mg/dL — ABNORMAL HIGH (ref 70–99)
Glucose-Capillary: 73 mg/dL (ref 70–99)
Glucose-Capillary: 51 mg/dL — ABNORMAL LOW (ref 70–99)
Glucose-Capillary: 89 mg/dL (ref 70–99)

## 2021-04-03 LAB — MRSA PCR SCREENING: MRSA by PCR: NEGATIVE

## 2021-04-03 LAB — APTT: aPTT: 34 seconds (ref 24–36)

## 2021-04-03 LAB — RESP PANEL BY RT-PCR (FLU A&B, COVID) ARPGX2
Influenza A by PCR: NEGATIVE
Influenza B by PCR: NEGATIVE
SARS Coronavirus 2 by RT PCR: NEGATIVE

## 2021-04-03 LAB — HEMOGLOBIN A1C
Hgb A1c MFr Bld: 5 % (ref 4.8–5.6)
Mean Plasma Glucose: 96.8 mg/dL

## 2021-04-03 LAB — ECHOCARDIOGRAM COMPLETE
Area-P 1/2: 3.53 cm2
Height: 75 in
S' Lateral: 2.7 cm
Weight: 3680 oz

## 2021-04-03 LAB — LACTIC ACID, PLASMA
Lactic Acid, Venous: 10.3 mmol/L (ref 0.5–1.9)
Lactic Acid, Venous: 5.7 mmol/L (ref 0.5–1.9)

## 2021-04-03 LAB — HIV ANTIBODY (ROUTINE TESTING W REFLEX): HIV Screen 4th Generation wRfx: NONREACTIVE

## 2021-04-03 LAB — MAGNESIUM: Magnesium: 2.8 mg/dL — ABNORMAL HIGH (ref 1.7–2.4)

## 2021-04-03 LAB — BRAIN NATRIURETIC PEPTIDE: B Natriuretic Peptide: 2218.2 pg/mL — ABNORMAL HIGH (ref 0.0–100.0)

## 2021-04-03 LAB — TROPONIN I (HIGH SENSITIVITY)
Troponin I (High Sensitivity): 47 ng/L — ABNORMAL HIGH (ref <18)
Troponin I (High Sensitivity): 253 ng/L (ref <18)

## 2021-04-03 MED ORDER — VALPROATE SODIUM 100 MG/ML IV SOLN
1500.0000 mg | Freq: Once | INTRAVENOUS | Status: AC
  Administered 2021-04-03: 1500 mg via INTRAVENOUS
  Filled 2021-04-03: qty 15

## 2021-04-03 MED ORDER — ORAL CARE MOUTH RINSE
15.0000 mL | OROMUCOSAL | Status: DC
  Administered 2021-04-03 – 2021-04-11 (×77): 15 mL via OROMUCOSAL

## 2021-04-03 MED ORDER — ACETAMINOPHEN 160 MG/5ML PO SOLN
650.0000 mg | ORAL | Status: DC | PRN
  Administered 2021-04-04: 650 mg
  Filled 2021-04-03: qty 20.3

## 2021-04-03 MED ORDER — AMIODARONE HCL IN DEXTROSE 360-4.14 MG/200ML-% IV SOLN
30.0000 mg/h | INTRAVENOUS | Status: DC
  Administered 2021-04-04 – 2021-04-05 (×3): 30 mg/h via INTRAVENOUS
  Filled 2021-04-03 (×2): qty 200

## 2021-04-03 MED ORDER — CALCIUM CHLORIDE 10 % IV SOLN
INTRAVENOUS | Status: AC | PRN
  Administered 2021-04-03 (×2): 1 g via INTRAVENOUS

## 2021-04-03 MED ORDER — EPINEPHRINE 1 MG/10ML IJ SOSY
PREFILLED_SYRINGE | INTRAMUSCULAR | Status: AC | PRN
  Administered 2021-04-03 (×2): 1 via INTRAVENOUS

## 2021-04-03 MED ORDER — SODIUM CHLORIDE 0.9 % IV SOLN
3.0000 g | INTRAVENOUS | Status: AC
  Administered 2021-04-03 – 2021-04-08 (×6): 3 g via INTRAVENOUS
  Filled 2021-04-03: qty 8
  Filled 2021-04-03: qty 3
  Filled 2021-04-03: qty 8
  Filled 2021-04-03: qty 3
  Filled 2021-04-03 (×2): qty 8

## 2021-04-03 MED ORDER — DOCUSATE SODIUM 50 MG/5ML PO LIQD: 100.0000 mg | Freq: Two times a day (BID) | ORAL | Status: DC | PRN

## 2021-04-03 MED ORDER — HEPARIN SODIUM (PORCINE) 5000 UNIT/ML IJ SOLN: 5000.0000 [IU] | Freq: Three times a day (TID) | INTRAMUSCULAR | Status: DC

## 2021-04-03 MED ORDER — AMIODARONE HCL IN DEXTROSE 360-4.14 MG/200ML-% IV SOLN
60.0000 mg/h | INTRAVENOUS | Status: AC
  Administered 2021-04-03 (×2): 60 mg/h via INTRAVENOUS
  Filled 2021-04-03 (×2): qty 200

## 2021-04-03 MED ORDER — POLYETHYLENE GLYCOL 3350 17 G PO PACK
17.0000 g | PACK | Freq: Every day | ORAL | Status: DC
  Administered 2021-04-03 – 2021-04-11 (×6): 17 g
  Filled 2021-04-03 (×7): qty 1

## 2021-04-03 MED ORDER — INSULIN ASPART 100 UNIT/ML IJ SOLN
0.0000 [IU] | INTRAMUSCULAR | Status: DC
  Administered 2021-04-03: 2 [IU] via SUBCUTANEOUS

## 2021-04-03 MED ORDER — LACTATED RINGERS IV SOLN
INTRAVENOUS | Status: DC
  Administered 2021-04-07: 10 mL/h via INTRAVENOUS

## 2021-04-03 MED ORDER — PANTOPRAZOLE SODIUM 40 MG IV SOLR
40.0000 mg | Freq: Every day | INTRAVENOUS | Status: DC
  Administered 2021-04-03 – 2021-04-07 (×5): 40 mg via INTRAVENOUS
  Filled 2021-04-03 (×5): qty 40

## 2021-04-03 MED ORDER — ONDANSETRON HCL 4 MG/2ML IJ SOLN: 4.0000 mg | Freq: Four times a day (QID) | INTRAMUSCULAR | Status: DC | PRN

## 2021-04-03 MED ORDER — FENTANYL 2500MCG IN NS 250ML (10MCG/ML) PREMIX INFUSION
0.0000 ug/h | INTRAVENOUS | Status: DC
  Filled 2021-04-03: qty 250

## 2021-04-03 MED ORDER — PROPOFOL 1000 MG/100ML IV EMUL
5.0000 ug/kg/min | INTRAVENOUS | Status: DC
  Administered 2021-04-03: 5 ug/kg/min via INTRAVENOUS
  Filled 2021-04-03: qty 100

## 2021-04-03 MED ORDER — HEPARIN SODIUM (PORCINE) 5000 UNIT/ML IJ SOLN
5000.0000 [IU] | Freq: Three times a day (TID) | INTRAMUSCULAR | Status: DC
  Administered 2021-04-03 – 2021-04-05 (×8): 5000 [IU] via SUBCUTANEOUS
  Filled 2021-04-03 (×8): qty 1

## 2021-04-03 MED ORDER — NICARDIPINE HCL IN NACL 20-0.86 MG/200ML-% IV SOLN
3.0000 mg/h | INTRAVENOUS | Status: DC
  Administered 2021-04-03: 3 mg/h via INTRAVENOUS
  Filled 2021-04-03 (×2): qty 200

## 2021-04-03 MED ORDER — PROPOFOL 1000 MG/100ML IV EMUL
100.0000 ug/kg/min | INTRAVENOUS | Status: DC
  Administered 2021-04-03: 20 ug/kg/min via INTRAVENOUS
  Administered 2021-04-03: 30 ug/kg/min via INTRAVENOUS
  Administered 2021-04-03: 100 ug/kg/min via INTRAVENOUS
  Administered 2021-04-03: 50 ug/kg/min via INTRAVENOUS
  Administered 2021-04-04 (×2): 100 ug/kg/min via INTRAVENOUS
  Administered 2021-04-04: 80 ug/kg/min via INTRAVENOUS
  Administered 2021-04-04: 100 ug/kg/min via INTRAVENOUS
  Filled 2021-04-03 (×5): qty 100
  Filled 2021-04-03: qty 300
  Filled 2021-04-03: qty 200
  Filled 2021-04-03 (×7): qty 100

## 2021-04-03 MED ORDER — VANCOMYCIN HCL 2000 MG/400ML IV SOLN
2000.0000 mg | Freq: Once | INTRAVENOUS | Status: DC
  Filled 2021-04-03: qty 400

## 2021-04-03 MED ORDER — CHLORHEXIDINE GLUCONATE 0.12% ORAL RINSE (MEDLINE KIT)
15.0000 mL | Freq: Two times a day (BID) | OROMUCOSAL | Status: DC
  Administered 2021-04-03 – 2021-04-11 (×16): 15 mL via OROMUCOSAL

## 2021-04-03 MED ORDER — DOCUSATE SODIUM 50 MG/5ML PO LIQD
100.0000 mg | Freq: Two times a day (BID) | ORAL | Status: DC
  Administered 2021-04-03 – 2021-04-11 (×12): 100 mg
  Filled 2021-04-03 (×15): qty 10

## 2021-04-03 MED ORDER — NICARDIPINE HCL IN NACL 40-0.83 MG/200ML-% IV SOLN
3.0000 mg/h | INTRAVENOUS | Status: DC
Start: 2021-04-03 — End: 2021-04-04
  Administered 2021-04-03: 10 mg/h via INTRAVENOUS
  Filled 2021-04-03 (×2): qty 200

## 2021-04-03 MED ORDER — FENTANYL CITRATE (PF) 100 MCG/2ML IJ SOLN: 50.0000 ug | INTRAMUSCULAR | Status: DC | PRN

## 2021-04-03 MED ORDER — SODIUM BICARBONATE 8.4 % IV SOLN
INTRAVENOUS | Status: AC | PRN
  Administered 2021-04-03 (×2): 50 meq via INTRAVENOUS

## 2021-04-03 MED ORDER — LEVETIRACETAM IN NACL 500 MG/100ML IV SOLN
500.0000 mg | INTRAVENOUS | Status: DC
  Administered 2021-04-04 – 2021-04-10 (×7): 500 mg via INTRAVENOUS
  Filled 2021-04-03 (×7): qty 100

## 2021-04-03 MED ORDER — CHLORHEXIDINE GLUCONATE CLOTH 2 % EX PADS
6.0000 | MEDICATED_PAD | Freq: Every day | CUTANEOUS | Status: DC
  Administered 2021-04-03 – 2021-04-11 (×10): 6 via TOPICAL

## 2021-04-03 MED ORDER — POLYETHYLENE GLYCOL 3350 17 G PO PACK: 17.0000 g | PACK | Freq: Every day | ORAL | Status: DC | PRN

## 2021-04-03 MED ORDER — ACETAMINOPHEN 325 MG PO TABS: 650.0000 mg | ORAL_TABLET | ORAL | Status: DC | PRN

## 2021-04-03 MED ORDER — SODIUM CHLORIDE 0.9 % IV SOLN
2000.0000 mg | Freq: Once | INTRAVENOUS | Status: AC
  Administered 2021-04-03: 2000 mg via INTRAVENOUS
  Filled 2021-04-03: qty 20

## 2021-04-03 MED ORDER — SODIUM CHLORIDE 0.9 % IV SOLN: INTRAVENOUS | Status: DC

## 2021-04-03 MED ORDER — AMIODARONE LOAD VIA INFUSION
150.0000 mg | Freq: Once | INTRAVENOUS | Status: AC
  Administered 2021-04-03: 150 mg via INTRAVENOUS
  Filled 2021-04-03: qty 83.34

## 2021-04-03 MED ORDER — ATROPINE SULFATE 1 MG/ML IJ SOLN
INTRAMUSCULAR | Status: AC | PRN
  Administered 2021-04-03: 1 mg via INTRAVENOUS

## 2021-04-03 MED ORDER — PIPERACILLIN-TAZOBACTAM 3.375 G IVPB 30 MIN: 3.3750 g | Freq: Once | INTRAVENOUS | Status: DC

## 2021-04-03 MED ORDER — NOREPINEPHRINE 4 MG/250ML-% IV SOLN: 0.0000 ug/min | INTRAVENOUS | Status: DC

## 2021-04-03 MED ORDER — ASPIRIN 300 MG RE SUPP
300.0000 mg | RECTAL | Status: AC
  Administered 2021-04-03: 300 mg via RECTAL
  Filled 2021-04-03: qty 1

## 2021-04-03 NOTE — Progress Notes (Addendum)
[facility] Progress Note Patient Name: [name] DOB: Aug 07, 1961 MRN: [id number]   Date of Service  04/23/2021  HPI/Events of Note  Bedside RN asking for F/U ABG since vent changes from 11:30 this AM  eICU Interventions  ABG ordered Call us with results     Intervention Category Major Interventions: Respiratory failure - evaluation and management  Dominic Mahaney G Phoenix Riesen 04/24/2021, 7:54 PM   8:30pm - ABG noted. Significant resp alkalosis while on PRVC VT 670, RR 16, peep 5, Fio2 50 Lower the VT to 580 cc for now, this will correspond to around 7 cc/kg IBW Repeat ABG at 9:45 pm ordered, call us with results  I placed updated vent orders Oxygenation is good so can wean Fio2 further  9:50 pm  ABG noted, much improved Continue current VT , RR and peep. Lower fio2 - he is still at 40% and O2 sat is 100. D/w RN

## 2021-04-03 NOTE — Progress Notes (Signed)
Found Buprenorphrine transdermal patch on patient's right deltoid on admission to unit. Patch removed. Shirlee Limerick, NP, made aware.

## 2021-04-03 NOTE — Progress Notes (Signed)
Pt transported to CT and back to [facility] without any complications.

## 2021-04-03 NOTE — Progress Notes (Signed)
Progress Note  Patient Name: [name] Date of Encounter: 04/04/2021  [facility] Cardiologist: No primary care provider on file.   Subjective   Remains intubated and sedated. Normothermic. No significant ectopy on tele.   Had myoclonic seizures overnight s/p valproic acid IV, versed bolus/gtt (now off due to hypotension), and high dose propofol. Qtc 600 this AM  TTE with LVEF 50-55%, severe LVH, G1DD, normal RV and systolic function, mild PAH  Inpatient Medications    Scheduled Meds: . chlorhexidine gluconate (MEDLINE KIT)  15 mL Mouth Rinse BID  . Chlorhexidine Gluconate Cloth  6 each Topical Q0600  . docusate  100 mg Per Tube BID  . heparin  5,000 Units Subcutaneous Q8H  . insulin aspart  0-9 Units Subcutaneous Q4H  . mouth rinse  15 mL Mouth Rinse 10 times per day  . pantoprazole (PROTONIX) IV  40 mg Intravenous QHS  . polyethylene glycol  17 g Per Tube Daily   Continuous Infusions: . amiodarone 30 mg/hr (04/04/21 0700)  . ampicillin-sulbactam (UNASYN) IV 3 g (04/04/21 0757)  . lactated ringers 10 mL/hr at 04/04/21 0700  . levETIRAcetam    . niCARDipine Stopped (04/07/2021 1618)  . propofol (DIPRIVAN) infusion 100 mcg/kg/min (04/04/21 0700)  . valproate sodium     PRN Meds: acetaminophen, docusate, fentaNYL (SUBLIMAZE) injection, ondansetron (ZOFRAN) IV, polyethylene glycol   Vital Signs    Vitals:   04/04/21 0615 04/04/21 0630 04/04/21 0645 04/04/21 0700  BP: (!) 116/45 (!) 116/38 (!) 117/38   Pulse: (!) 52 (!) 51 (!) 51 (!) 52  Resp: '16 16 16 16  ' Temp: 97.7 F (36.5 C) 97.7 F (36.5 C) 97.7 F (36.5 C) 97.7 F (36.5 C)  TempSrc:      SpO2: 100% 100% 100% 100%  Weight:      Height:        Intake/Output Summary (Last 24 hours) at 04/04/2021 0801 Last data filed at 04/04/2021 0700 Gross per 24 hour  Intake 2164.09 ml  Output --  Net 2164.09 ml   Last 3 Weights 04/04/2021 04/25/2021  Weight (lbs) 190 lb 7.6 oz 230 lb  Weight (kg) 86.4 kg 104.327 kg       Telemetry    NSR, occasional PVC, no VT - Personally Reviewed  ECG    Sinus bradycardia, 1 degree AVB, LVH, prolonged Qtc (637m) - Personally Reviewed  Physical Exam   GEN: Intubated and sedated  Neck: No JVD Cardiac: Bradycardic, regular, no murmurs Respiratory: Coarse vent sounds GI: Soft, non-distended  MS: Bilateral BKA Neuro: Intubated and sedated Psych: Unable to assess  Labs    High Sensitivity Troponin:   Recent Labs  Lab 04/20/2021 0615 04/23/2021 1058  TROPONINIHS 47* 253*      Chemistry Recent Labs  Lab 04/15/2021 0615 04/26/2021 0619 05/01/2021 1058 04/04/2021 2011 04/22/2021 2018 04/22/2021 2143 04/04/21 0532  NA 141   < > 139   < > 139 140 138  K 4.2   < > 3.7   < > 4.7 3.9 4.4  CL 97*   < > 98  --  98  --  98  CO2 21*  --  19*  --  18*  --  15*  GLUCOSE 240*   < > 205*  --  83  --  89  BUN 55*   < > 60*  --  70*  --  73*  CREATININE 9.45*   < > 9.61*  --  9.79*  --  10.15*  CALCIUM 10.3  --  8.1*  --  7.8*  --  7.2*  PROT 6.4*  --   --   --   --   --   --   ALBUMIN 2.4*  --   --   --   --   --   --   AST 23  --   --   --   --   --   --   ALT 9  --   --   --   --   --   --   ALKPHOS 54  --   --   --   --   --   --   BILITOT 0.8  --   --   --   --   --   --   GFRNONAA 6*  --  6*  --  6*  --  5*  ANIONGAP 23*  --  22*  --  23*  --  25*   < > = values in this interval not displayed.     Hematology Recent Labs  Lab 04/20/2021 0615 05/01/2021 7616 04/18/2021 1058 04/30/2021 2011 04/30/2021 2143 04/04/21 0410  WBC 13.6*  --  11.8*  --   --  14.7*  RBC 4.19*  --  4.41  --   --  3.45*  HGB 9.5*   < > 9.9* 10.2* 9.5* 7.9*  HCT 34.3*   < > 33.2* 30.0* 28.0* 25.7*  MCV 81.9  --  75.3*  --   --  74.5*  MCH 22.7*  --  22.4*  --   --  22.9*  MCHC 27.7*  --  29.8*  --   --  30.7  RDW 23.9*  --  22.9*  --   --  22.8*  PLT 244  --  245  --   --  205   < > = values in this interval not displayed.    BNP Recent Labs  Lab 04/05/2021 0616  BNP 2,218.2*      DDimer  Recent Labs  Lab 04/04/21 0359  DDIMER 10.71*     Radiology    CT HEAD WO CONTRAST  Result Date: 04/25/2021 CLINICAL DATA:  Anoxic brain damage. Cardiac arrest with 15 to 20 minutes of CPR. EXAM: CT HEAD WITHOUT CONTRAST TECHNIQUE: Contiguous axial images were obtained from the base of the skull through the vertex without intravenous contrast. COMPARISON:  January 21, 2021 FINDINGS: Brain: No subdural, epidural, or subarachnoid hemorrhage identified. The ventricles and sulci are stable. 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 Cerebellum, brainstem, and basal cisterns are otherwise normal. No mass effect or midline shift. White matter changes are noted. The gray-white differentiation is identified. No acute cortical ischemia. Chronic tiny right frontal infarct. The sulci are not effaced. No mass effect or midline shift. Vascular: Calcified atherosclerosis is seen in the intracranial carotids. Skull: Normal. Negative for fracture or focal lesion. Sinuses/Orbits: Mild mucosal thickening in the maxillary sinuses. Mucosal thickening in the frontal sinuses and scattered ethmoid air cells. No air-fluid levels. Mastoid air cells and middle ears are well aerated. Other: An ET tube is identified. IMPRESSION: 1. No acute intracranial abnormality noted at this time. 2. Sinus disease as above.  An ETT is identified. Electronically Signed   By: Dorise Bullion III M.D   On: 04/08/2021 09:00   DG Chest Port 1 View  Result Date: 04/04/2021 CLINICAL DATA:  Intubation EXAM: PORTABLE CHEST 1 VIEW COMPARISON:  Radiograph  04/26/2021 FINDINGS: *Endotracheal tube tip terminates in the mid trachea, 5 cm from the carina. *Transesophageal temperature probe in the upper thoracic esophagus. *Transesophageal tube tip and side port distal to the GE junction. *Telemetry leads and external support devices overlie the chest. Significantly improved airspace opacity throughout the right lung. No new  consolidative process is seen. No visible pneumothorax or effusion. Stable cardiomediastinal contours with a calcified aorta. No acute osseous or soft tissue abnormality. Degenerative changes are present in the imaged spine and shoulders. IMPRESSION: Significantly improved airspace opacities throughout the right hemithorax. Lines and tubes as above. Electronically Signed   By: Lovena Le M.D.   On: 04/04/2021 06:13   DG Chest Portable 1 View  Result Date: 04/04/2021 CLINICAL DATA:  OG tube placement. EXAM: PORTABLE CHEST 1 VIEW COMPARISON:  Earlier same day FINDINGS: Portable view of the lower chest and upper abdomen shows the tip of the NG tube in the proximal stomach. Although the proximal side port of the NG tube is obscured by overlying wires, is probably at the GE junction. Prominent gastric bubble noted with diffuse gaseous bowel distention associated. IMPRESSION: NG tube tip is in the stomach although the proximal side port is probably at the level of the GE junction. Tube could be advanced another 4-5 cm to ensure side port position in the stomach. Electronically Signed   By: Misty Stanley M.D.   On: 04/19/2021 08:06   DG Chest Portable 1 View  Result Date: 04/06/2021 CLINICAL DATA:  60 year old male status post CPR.  Intubated. EXAM: PORTABLE CHEST 1 VIEW COMPARISON:  Portable chest 11/24/2020 and earlier. FINDINGS: Portable AP supine view at 0625 hours. Endotracheal tube tip in good position just below the clavicles. Mediastinal contours are stable and within normal limits. Visualized tracheal air column is within normal limits. Pacer or resuscitation pads project over the lower chest. Allowing for portable technique the left lung appears clear but there is widespread abnormal right lung opacity, most confluent about the hilum. No pneumothorax or pleural effusion evident on this supine view. No acute osseous abnormality identified. IMPRESSION: 1. ET tube in good position. 2. Widespread right lung  opacity most confluent about the hilum. Aspiration or pneumonia favored over asymmetric edema as the left lung appears to be clear. Electronically Signed   By: Genevie Ann M.D.   On: 04/25/2021 06:43   EEG adult  Result Date: 04/05/2021 Lora Havens, MD     04/23/2021  3:32 PM Patient Name: [name] MRN: [id number] Epilepsy Attending: Lora Havens Referring Physician/Provider: Dr Jacky Kindle Date: 04/15/2021 Duration: 34.46 mins Patient history: 60yo M s/p cardiac arrest. EEG to evaluate for seizure Level of alertness:  comatose AEDs during EEG study: Propofol Technical aspects: This EEG study was done with scalp electrodes positioned according to the 10-20 International system of electrode placement. Electrical activity was acquired at a sampling rate of '500Hz'  and reviewed with a high frequency filter of '70Hz'  and a low frequency filter of '1Hz' . EEG data were recorded continuously and digitally stored. Description: EEG showed continuous generalized background suppression. Patient was noted to have episodes of whole body brief jerks. Concomitant eeg showed generalized polyspike and wave consistent with myoclonic seizure, every 2-3 minutes, lasting 30-45 seconds. EEG was not reactive to tactile stimulation. Hyperventilation and photic stimulation were not performed.   ABNORMALITY - Myoclonic seizure, generalized - Background suppression, generalized IMPRESSION: This study showed myoclonic seizures, every 2-3 minutes, lasting 30-45 seconds as well as profound diffuse encephalopathy. In  setting of cardiac arrest, this is most likely suggestive of anoxic-hypoxic injury. Dr Tacy Learn was notified Lora Havens   ECHOCARDIOGRAM COMPLETE  Result Date: 04/18/2021    ECHOCARDIOGRAM REPORT   Patient Name:   CHANCELER PULLIN Date of Exam: 04/22/2021 Medical Rec #:  [id number]      Height:       75.0 in Accession #:    [id number]     Weight:       230.0 lb Date of Birth:  22-Feb-1961      BSA:          2.329 m Patient Age:     60 years       BP:           191/93 mmHg Patient Gender: M              HR:           96 bpm. Exam Location:  Inpatient Procedure: 2D Echo, Cardiac Doppler and Color Doppler Indications:    Cardiac arrest  History:        Patient has no prior history of Echocardiogram examinations.                 PVD, Arrythmias:Cardiac Arrest and NSVT,                 Signs/Symptoms:Shortness of Breath and ESRD; Risk                 Factors:Diabetes, Hypertension and Dyslipidemia.  Sonographer:    Dustin Flock Referring Phys: [phone] [place]  1. Left ventricular ejection fraction, by estimation, is 50 to 55%. The left ventricle has low normal function. The left ventricle has no regional wall motion abnormalities. There is severe left ventricular hypertrophy. Left ventricular diastolic parameters are consistent with Grade I diastolic dysfunction (impaired relaxation).  2. Right ventricular systolic function is normal. The right ventricular size is normal. There is mildly elevated pulmonary artery systolic pressure.  3. The mitral valve is normal in structure. No evidence of mitral valve regurgitation. No evidence of mitral stenosis.  4. The aortic valve is calcified. Aortic valve regurgitation is not visualized. Mild to moderate aortic valve sclerosis/calcification is present, without any evidence of aortic stenosis.  5. The inferior vena cava is normal in size with greater than 50% respiratory variability, suggesting right atrial pressure of 3 mmHg. FINDINGS  Left Ventricle: Left ventricular ejection fraction, by estimation, is 50 to 55%. The left ventricle has low normal function. The left ventricle has no regional wall motion abnormalities. The left ventricular internal cavity size was normal in size. There is severe left ventricular hypertrophy. Left ventricular diastolic parameters are consistent with Grade I diastolic dysfunction (impaired relaxation). Right Ventricle: The right ventricular size is  normal. No increase in right ventricular wall thickness. Right ventricular systolic function is normal. There is mildly elevated pulmonary artery systolic pressure. The tricuspid regurgitant velocity is 3.07  m/s, and with an assumed right atrial pressure of 3 mmHg, the estimated right ventricular systolic pressure is 00.9 mmHg. Left Atrium: Left atrial size was normal in size. Right Atrium: Right atrial size was normal in size. Pericardium: There is no evidence of pericardial effusion. Mitral Valve: The mitral valve is normal in structure. No evidence of mitral valve regurgitation. No evidence of mitral valve stenosis. Tricuspid Valve: The tricuspid valve is normal in structure. Tricuspid valve regurgitation is trivial. No evidence of tricuspid stenosis. Aortic Valve: The aortic valve is calcified.  Aortic valve regurgitation is not visualized. Mild to moderate aortic valve sclerosis/calcification is present, without any evidence of aortic stenosis. Pulmonic Valve: The pulmonic valve was normal in structure. Pulmonic valve regurgitation is not visualized. No evidence of pulmonic stenosis. Aorta: The aortic root is normal in size and structure. Venous: The inferior vena cava is normal in size with greater than 50% respiratory variability, suggesting right atrial pressure of 3 mmHg. IAS/Shunts: No atrial level shunt detected by color flow Doppler.  LEFT VENTRICLE PLAX 2D LVIDd:         3.80 cm  Diastology LVIDs:         2.70 cm  LV e' medial:    5.11 cm/s LV PW:         1.50 cm  LV E/e' medial:  17.7 LV IVS:        1.70 cm  LV e' lateral:   8.05 cm/s LVOT diam:     2.40 cm  LV E/e' lateral: 11.2 LV SV:         96 LV SV Index:   41 LVOT Area:     4.52 cm  RIGHT VENTRICLE RV Basal diam:  2.80 cm RV S prime:     14.50 cm/s TAPSE (M-mode): 3.1 cm LEFT ATRIUM             Index       RIGHT ATRIUM           Index LA diam:        4.00 cm 1.72 cm/m  RA Area:     16.00 cm LA Vol (A2C):   56.3 ml 24.18 ml/m RA Volume:    44.00 ml  18.89 ml/m LA Vol (A4C):   57.0 ml 24.48 ml/m LA Biplane Vol: 58.3 ml 25.04 ml/m  AORTIC VALVE LVOT Vmax:   128.00 cm/s LVOT Vmean:  78.000 cm/s LVOT VTI:    0.213 m  AORTA Ao Root diam: 3.30 cm MITRAL VALVE               TRICUSPID VALVE MV Area (PHT): 3.53 cm    TR Peak grad:   37.7 mmHg MV Decel Time: 215 msec    TR Vmax:        307.00 cm/s MV E velocity: 90.40 cm/s MV A velocity: 97.30 cm/s  SHUNTS MV E/A ratio:  0.93        Systemic VTI:  0.21 m                            Systemic Diam: 2.40 cm Candee Furbish MD Electronically signed by Candee Furbish MD Signature Date/Time: 04/16/2021/12:28:54 PM    Final     Cardiac Studies   TTE 04/17/2021: IMPRESSIONS  1. Left ventricular ejection fraction, by estimation, is 50 to 55%. The  left ventricle has low normal function. The left ventricle has no regional  wall motion abnormalities. There is severe left ventricular hypertrophy.  Left ventricular diastolic  parameters are consistent with Grade I diastolic dysfunction (impaired  relaxation).  2. Right ventricular systolic function is normal. The right ventricular  size is normal. There is mildly elevated pulmonary artery systolic  pressure.  3. The mitral valve is normal in structure. No evidence of mitral valve  regurgitation. No evidence of mitral stenosis.  4. The aortic valve is calcified. Aortic valve regurgitation is not  visualized. Mild to moderate aortic valve sclerosis/calcification is  present, without any evidence of  aortic stenosis.  5. The inferior vena cava is normal in size with greater than 50%  respiratory variability, suggesting right atrial pressure of 3 mmHg.   Patient Profile     60 y.o. 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  Assessment & Plan    #Cardiac arrest #VT Patient presented with respiratory failure complicated by cardiac arrest with initial rhythm reported as asystole (?) requiring ~172m of CPR. Given epi and lido with ROSC. On arrival to ER, he was in brief episode of VT that converted (do not believe shock was administered) No known hisotry of CAD but has several risk factors. TTE with normal EF, no significant WMA -Maintaining normothermia -Continue amiodarone gtt -TTE with normal LVEF, no significant WMA -If meaningful neurologic recovery, will likely need LHC -Continue amiodarone gtt  #Aspiration PNA: Likely a complication of respiratory failure in the field s/p intubation. -Continue ABX per primary team  #Myoclonic Seizures: Multiple myoclonic seizures overnight on EEG. Started on keppra, versed and valproate. Remains on propofol. -Started on versed, keppra, and valproic acid -On high dose propofol -Neurology following  #Prolonged QTc: Likely a result of multiple Qtc prolonging medications. No VT on monitor. Will need to watch closely. -Monitor closely for recurrence of VT -Continue amiodarone gtt for now -Keep K>4, Mg>2 -Wean seizure medications/sedation as tolerated  #HTN: Previously on nicardipine gtt; weaned off due to hypotension with sedation. On amlodipine 1049maily, carvedilol 12.5mg50mD (once daily on HD days), and hydralazine 100mg68m at home. -Holding home antihypertensives due to need for increased sedation in the setting of multiple seizures overnight.  #PVD: Extensive amputation history including bilateral BKA, amputation of fingers on right hand, and amputation of thumb/5th fingers on left hand. He is on plavix outpatient.  - Resume plavix and statin when able   #HLD: -Resume statin as above  #ESRD on HD: Missed HD on 04/01/21, however, electrolytes  fairly stable on admission and likely not the primary driver of arrest. -Management per renal  CRITICAL CARE TIME: I have spent a total of 40 minutes with patient reviewing hospital notes, telemetry, EKGs, labs and examining the patient as well as establishing an assessment and plan that was discussed with the patient.  > 50% of time was spent in direct patient care. The patient is critically ill with multi-organ system failure and requires high complexity decision making for assessment and support, frequent evaluation and titration of therapies, application of advanced monitoring technologies and extensive interpretation of multiple databases.     For questions or updates, please contact [facility] consult [email or web address] for contact info under        Signed, HeathFreada Bergeron 04/04/2021, 8:01 AM

## 2021-04-03 NOTE — Progress Notes (Signed)
Pharmacy Antibiotic Note  [name] is a 60 y.o. male admitted on 04/05/2021 with cardiac arrest, now concern for aspiration pneumonia.  Pharmacy has been consulted for Unasyn dosing.  Plan: Unasyn 3g IV Q24H.  Height: '6\' 3"'$  (190.5 cm) Weight: 104.3 kg (230 lb) IBW/kg (Calculated) : 84.5  No data recorded.  Recent Labs  Lab 04/14/2021 0615 04/30/2021 0616 04/24/2021 0619  WBC 13.6*  --   --   CREATININE 9.45*  --  9.70*  LATICACIDVEN  --  10.3*  --     Estimated Creatinine Clearance: 10.3 mL/min (A) (by C-G formula based on SCr of 9.7 mg/dL (H)).    No Known Allergies  Thank you for allowing pharmacy to be a part of this patient's care.  Wynona Neat, PharmD, BCPS  04/05/2021 7:54 AM

## 2021-04-03 NOTE — ED Notes (Signed)
Pt noted to have bilateral BKA. Pt has one foot on LT. Only has sleeve on RT side.

## 2021-04-03 NOTE — ED Provider Notes (Signed)
[place] [facility] EMERGENCY DEPARTMENT Provider Note   CSN: [id number] Arrival date & time: 04/30/2021  0604     History No chief complaint on file.   [name] is a 60 y.o. male.  Patient is a 60 year old male with history of end-stage renal disease on hemodialysis, prior below the knee amputee bilaterally.  Patient presents today with CPR in progress.  From what I am told, EMS was called to the house for difficulty breathing.  Patient's condition deteriorated in the ambulance to the point he required ventilation by bag-valve-mask, then [facility] airway once his mental status declined.  Patient arrives with CPR in progress and being ventilated through the [facility] airway.  He has no additional history.  The history is provided by the patient.       No past medical history on file.  There are no problems to display for this patient.        No family history on file.     Home Medications Prior to Admission medications   Not on File    Allergies    Patient has no allergy information on record.  Review of Systems   Review of Systems  Unable to perform ROS: Intubated    Physical Exam Updated Vital Signs Ht '6\' 3"'$  (1.905 m)   Physical Exam Vitals and nursing note reviewed.  Constitutional:      Appearance: He is well-developed.     Comments: Patient is an acutely on chronically ill-appearing male.  He is unresponsive upon presentation.  HENT:     Head: Normocephalic and atraumatic.  Cardiovascular:     Rate and Rhythm: Normal rate and regular rhythm.     Heart sounds: No murmur heard. No friction rub.  Pulmonary:     Comments: Ventilation performed with [name] airway.  Breath sounds appear adequate bilaterally upon presentation. Abdominal:     General: Bowel sounds are normal. There is no distension.     Palpations: Abdomen is soft.     Tenderness: There is no abdominal tenderness.  Musculoskeletal:        General: Normal range of motion.      Cervical back: Normal range of motion and neck supple.     Comments: Patient status post amputation of bilateral lower extremities below the knee.  He also has multiple areas that appear to be dialysis grafts to his arms.  Skin:    General: Skin is warm and dry.  Neurological:     Mental Status: He is oriented to person, place, and time.     Coordination: Coordination normal.     ED Results / Procedures / Treatments   Labs (all labs ordered are listed, but only abnormal results are displayed) Labs Reviewed  I-STAT CHEM 8, ED - Abnormal; Notable for the following components:      Result Value   BUN 65 (*)    Creatinine, Ser 9.70 (*)    Glucose, Bld 226 (*)    Hemoglobin 11.6 (*)    HCT 34.0 (*)    All other components within normal limits  RESP PANEL BY RT-PCR (FLU A&B, COVID) ARPGX2  CBC WITH DIFFERENTIAL/PLATELET  COMPREHENSIVE METABOLIC PANEL  MAGNESIUM  LACTIC ACID, PLASMA  LACTIC ACID, PLASMA  BRAIN NATRIURETIC PEPTIDE  I-STAT ARTERIAL BLOOD GAS, ED  TROPONIN I (HIGH SENSITIVITY)    EKG None       Radiology No results found.  Procedures Procedures   Medications Ordered in ED Medications  calcium chloride injection (1  g Intravenous Given 04/27/2021 0606)  EPINEPHrine (ADRENALIN) 1 MG/10ML injection (1 Syringe Intravenous Given 04/22/2021 0604)  atropine injection (1 mg Intravenous Given 04/26/2021 0602)  sodium bicarbonate injection (50 mEq Intravenous Given 04/24/2021 0605)    ED Course  I have reviewed the triage vital signs and the nursing notes.  Pertinent labs & imaging results that were available during my care of the patient were reviewed by me and considered in my medical decision making (see chart for details).    MDM Rules/Calculators/A&P  Patient is a 60 year old male brought by EMS for evaluation of respiratory distress.  Patient was found to be hypoxic and having difficulty breathing at home.  I was told he skipped his dialysis on Friday and has not been  dialyzed since Wednesday.  While on the ambulance, his condition deteriorated and ultimately required bagging.  He then became asystolic and CPR was initiated.  A [name] airway was placed and he arrived here in cardiac arrest.  Patient seen immediately upon arrival to the emergency department.  CPR was continued and ventilation was continued with the Ambu bag/[name] airway.  Patient given epinephrine, calcium, and bicarb for presumed hyperkalemia.  Additional IV access was established and blood samples obtained through a femoral stick.  After approximately 15 minutes total CPR between ambulance and the emergency department, an organized electrical rhythm was obtained.  Patient had strong pulses and blood pressure over 200.  He has since been started on a propofol drip.  Laboratory studies thus far show a pH of 7.2, white count of 13,000, and chest x-ray showing widespread right lung opacity most confluent about the hilum.  Aspiration or pneumonia is favored.  Patient will be treated with IV antibiotics.  Care has been discussed with critical care who will evaluate and admit.  CRITICAL CARE Performed by: Veryl Speak Total critical care time: 50 minutes Critical care time was exclusive of separately billable procedures and treating other patients. Critical care was necessary to treat or prevent imminent or life-threatening deterioration. Critical care was time spent personally by me on the following activities: development of treatment plan with patient and/or surrogate as well as nursing, discussions with consultants, evaluation of patient's response to treatment, examination of patient, obtaining history from patient or surrogate, ordering and performing treatments and interventions, ordering and review of laboratory studies, ordering and review of radiographic studies, pulse oximetry and re-evaluation of patient's condition.   Final Clinical Impression(s) / ED Diagnoses Final diagnoses:  None     Rx / DC Orders ED Discharge Orders    None       Veryl Speak, MD 04/29/[phone]

## 2021-04-03 NOTE — Consult Note (Addendum)
[facility] Renal Consultation Note    Indication for Consultation:  Management of ESRD/hemodialysis, anemia, hypertension/volume, and secondary hyperparathyroidism.  HPI: [name] is a 60 y.o. male with PMH including ESRD on dialysis MWF, diabetes and diabetic nephropathy, peripheral vascular disease status post multiple amputations, hepatitis C on therapy, history of seizure disorders and history of osteomyelitis. Pt was recently admitted 02/15/21-02/18/21 for amputations of thumb and 5th digit on the right on 3/16 due to ischemic necrosis (pt has two charts in Epic). He presented this AM as a code blue. Reportedly, EMS was called for respiratory distress. Patient's condition deteriorated eventually requiring a king airway and 7 minutes of CPR with EMS and 6 minutes in the ED. ED RN reports he had what appeared to be seizure-like activity and bit his tongue. CXR showed widespread R lung opacity favoriting aspiration over asymmetrical edema. L lung was clear. Most recent labs notable for K+ 3.8, Na 139, Cr 9.7, BUN 65, Hgb 11.9,  ABG with pH 7.168, pCO2 53.4, pO2 181. Lactic acid 10.3, BUN 2218.2. Pt was intubated and started on IV antibiotics. Pt is unable to contribute to ROS. He did miss dialysis on 04/01/21. Interestingly, outpatient HD records report the patient told staff at his dialysis center that he was moving out of town, and did not answer their phone call on 04/01/21. I spoke with his wife on the phone to obtain consent for dialysis. She confirms they are moving soon but have not moved yet.   Social History:  has no history on file for tobacco use, alcohol use, and drug use.  ROS: As per HPI otherwise negative.  Physical Exam: Vitals:   04/25/2021 0715 04/29/2021 0730 04/15/2021 0745 04/04/2021 0753  BP: (!) 199/106 (!) 184/100 (!) 179/99 (!) 166/99  Pulse:    64  Resp: (!) 28 (!) 28 (!) 28 20  SpO2:    100%  Weight:      Height:         General: Well developed male,  intubated and unresponsive.  Head: Normocephalic,  sclera non-icteric, mucus membranes are moist. Small amount of blood noted in mouth Neck: JVD not elevated. Lungs: Intubated, decreased breath sounds throughout lung fields but no rales or wheezes appreciated.  Heart: RRR with normal S1, S2. No murmurs, rubs, or gallops appreciated. Abdomen: Soft, non-distended with normoactive bowel sounds. No obvious abdominal masses. Musculoskeletal:  B/L BKA, amputation of all fingers on R hand and thumb/fifth finger on left hand. No pitting edema b/l lower extermities Neuro: Unresponsive Dialysis Access: LUE AVF + bruit  No Known Allergies Prior to Admission medications   Not on File   Current Facility-Administered Medications  Medication Dose Route Frequency Provider Last Rate Last Admin  . acetaminophen (TYLENOL) tablet 650 mg  650 mg Oral Q4H PRN Bowser, Laurel Dimmer, NP      . amiodarone (NEXTERONE) 1.8 mg/mL load via infusion 150 mg  150 mg Intravenous Once Kroeger, Krista M., PA-C       Followed by  . amiodarone (NEXTERONE PREMIX) 360-4.14 MG/200ML-% (1.8 mg/mL) IV infusion  60 mg/hr Intravenous Continuous Kroeger, Krista M., PA-C       Followed by  . amiodarone (NEXTERONE PREMIX) 360-4.14 MG/200ML-% (1.8 mg/mL) IV infusion  30 mg/hr Intravenous Continuous Kroeger, Krista M., PA-C      . Ampicillin-Sulbactam (UNASYN) 3 g in sodium chloride 0.9 % 100 mL IVPB  3 g Intravenous Q24H Laren Everts, RPH      . aspirin suppository  300 mg  300 mg Rectal NOW Jacky Kindle, MD      . docusate (COLACE) 50 MG/5ML liquid 100 mg  100 mg Per Tube BID Bowser, Laurel Dimmer, NP      . docusate sodium (COLACE) capsule 100 mg  100 mg Oral BID PRN Bowser, Laurel Dimmer, NP      . fentaNYL (SUBLIMAZE) injection 50 mcg  50 mcg Intravenous Q15 min PRN Bowser, Laurel Dimmer, NP      . fentaNYL (SUBLIMAZE) injection 50-200 mcg  50-200 mcg Intravenous Q30 min PRN Bowser, Laurel Dimmer, NP      . heparin injection 5,000 Units  5,000 Units  Subcutaneous Q8H Bowser, Laurel Dimmer, NP      . lactated ringers infusion   Intravenous Continuous Bowser, Laurel Dimmer, NP      . nicardipine (CARDENE) '20mg'$  in 0.86% saline 230m IV infusion (0.1 mg/ml)  3-15 mg/hr Intravenous Continuous CJacky Kindle MD      . ondansetron (ZOFRAN) injection 4 mg  4 mg Intravenous Q6H PRN Bowser, GLaurel Dimmer NP      . pantoprazole (PROTONIX) injection 40 mg  40 mg Intravenous QHS Bowser, Grace E, NP      . polyethylene glycol (MIRALAX / GLYCOLAX) packet 17 g  17 g Oral Daily PRN Bowser, GLaurel Dimmer NP      . polyethylene glycol (MIRALAX / GLYCOLAX) packet 17 g  17 g Per Tube Daily Bowser, GLaurel Dimmer NP       No current outpatient medications on file.   Labs: Basic Metabolic Panel: Recent Labs  Lab 05/02/2021 0615 04/26/2021 0619 04/09/2021 0655  NA 141 139 139  K 4.2 3.9 3.8  CL 97* 103  --   CO2 21*  --   --   GLUCOSE 240* 226*  --   BUN 55* 65*  --   CREATININE 9.45* 9.70*  --   CALCIUM 10.3  --   --    Liver Function Tests: Recent Labs  Lab 04/30/2021 0615  AST 23  ALT 9  ALKPHOS 54  BILITOT 0.8  PROT 6.4*  ALBUMIN 2.4*   CBC: Recent Labs  Lab 04/24/2021 0615 04/28/2021 0619 04/05/2021 0655  WBC 13.6*  --   --   NEUTROABS 7.0  --   --   HGB 9.5* 11.6* 11.9*  HCT 34.3* 34.0* 35.0*  MCV 81.9  --   --   PLT 244  --   --    Studies/Results: CT HEAD WO CONTRAST  Result Date: 04/27/2021 CLINICAL DATA:  Anoxic brain damage. Cardiac arrest with 15 to 20 minutes of CPR. EXAM: CT HEAD WITHOUT CONTRAST TECHNIQUE: Contiguous axial images were obtained from the base of the skull through the vertex without intravenous contrast. COMPARISON:  January 21, 2021 FINDINGS: Brain: No subdural, epidural, or subarachnoid hemorrhage identified. The ventricles and sulci are stable. 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 Cerebellum, brainstem, and basal cisterns are otherwise normal. No mass effect or midline shift. White matter changes are noted. The gray-white  differentiation is identified. No acute cortical ischemia. Chronic tiny right frontal infarct. The sulci are not effaced. No mass effect or midline shift. Vascular: Calcified atherosclerosis is seen in the intracranial carotids. Skull: Normal. Negative for fracture or focal lesion. Sinuses/Orbits: Mild mucosal thickening in the maxillary sinuses. Mucosal thickening in the frontal sinuses and scattered ethmoid air cells. No air-fluid levels. Mastoid air cells and middle ears are well aerated. Other: An ET tube is identified. IMPRESSION: 1.  No acute intracranial abnormality noted at this time. 2. Sinus disease as above.  An ETT is identified. Electronically Signed   By: Dorise Bullion III M.D   On: 04/21/2021 09:00   DG Chest Portable 1 View  Result Date: 04/30/2021 CLINICAL DATA:  OG tube placement. EXAM: PORTABLE CHEST 1 VIEW COMPARISON:  Earlier same day FINDINGS: Portable view of the lower chest and upper abdomen shows the tip of the NG tube in the proximal stomach. Although the proximal side port of the NG tube is obscured by overlying wires, is probably at the GE junction. Prominent gastric bubble noted with diffuse gaseous bowel distention associated. IMPRESSION: NG tube tip is in the stomach although the proximal side port is probably at the level of the GE junction. Tube could be advanced another 4-5 cm to ensure side port position in the stomach. Electronically Signed   By: Misty Stanley M.D.   On: 04/26/2021 08:06   DG Chest Portable 1 View  Result Date: 04/14/2021 CLINICAL DATA:  60 year old male status post CPR.  Intubated. EXAM: PORTABLE CHEST 1 VIEW COMPARISON:  Portable chest 11/24/2020 and earlier. FINDINGS: Portable AP supine view at 0625 hours. Endotracheal tube tip in good position just below the clavicles. Mediastinal contours are stable and within normal limits. Visualized tracheal air column is within normal limits. Pacer or resuscitation pads project over the lower chest. Allowing for  portable technique the left lung appears clear but there is widespread abnormal right lung opacity, most confluent about the hilum. No pneumothorax or pleural effusion evident on this supine view. No acute osseous abnormality identified. IMPRESSION: 1. ET tube in good position. 2. Widespread right lung opacity most confluent about the hilum. Aspiration or pneumonia favored over asymmetric edema as the left lung appears to be clear. Electronically Signed   By: Genevie Ann M.D.   On: 04/14/2021 06:43    Outpatient Dialysis Orders:  Center: [facility] [place] on MWF. Last HD on 03/30/21 Optiflux 200NRe, Time: 4:15hr, BFR 425, DFR 500, EDW 73.5kg, 2K/2.5Ca, AVF 15g, no heparin Mircera 155mg IV q 2 weeks- last dose 03/30/21 Venofer '50mg'$  weekly Calcitriol 2.556m PO q HD renvela 4 tabs PO TID with meals  Assessment/Plan: 1.  Cardiac arrest: EMS called for respiratory distress, patient deteriorated and eventually required king airway and CPR. CXR with large R infiltrate concerning for aspiration. RN also reports seizure-like activity in the ED. Lactic acid is elevated. On IV antibiotics per primary team.  2.  ESRD:  Missed HD Friday. Labs without hyperkalemia. CXR more consistent with aspiration rather than edema. I do not think there is any indication for acute dialysis right now, for now will plan on doing dialysis tomorrow (consent obtained from patient's spouse).   3.  Hypertension/volume: BP is elevated s/p arrest. Started on amiodarone for v. Fib and nicardipine drip. Bed weight likely inaccurate as patient's last weight was approx 73.5kg on 4/27 and he is not significantly edematous.  4.  Anemia: Hgb at goal. Not due for ESA dose 5.  Metabolic bone disease: Calcium elevated. NPO, calcitriol and renvela on hold.  6.  Nutrition:  NPO 7. PVD: Patient with history of severe PVD and multiple amputations. Cardiology consulting and planning to check echocardiogram, consider cardiac cath if patient starts to improve.    SaAnice PaganiniPA-C 04/16/2021, 9:14 AM  [facility] Pager: [phone] attending: Patient was seen and examined in ICU.  Chart reviewed.  I agree with assessment plan as outlined above.  Status post cardiac arrest now intubated and in ICU.  It looks like patient is having seizure and had tongue bite.  He missed last dialysis on Friday but chest x-ray without any frank pulmonary edema.  Potassium level acceptable.  No urgent need for HD therefore we will plan for next dialysis tomorrow. Patient is critically ill.  Discussed with ICU team.  Katheran [name], MD [facility].

## 2021-04-03 NOTE — Consult Note (Addendum)
NEUROLOGY CONSULTATION NOTE   Date of service: Apr 03, 2021 Patient Name: [name] MRN:  [id number] DOB:  [birth year] Reason for consult: "Myoclonic seizures" Requesting Provider: Jacky Kindle, MD _ _ _   _ __   _ __ _ _  __ __   _ __   __ _  History of Present Illness  [name] is a 60 y.o. male with PMH significant for ERSD on HD, PAD s/p BL BKA p/w SOB and cardiac arrest enroute. Per chart review, patient called EMS for SOB. Enroute developed worsening hypoxia leading to respiratory arrest and asystole. CPR was in progress on arrival to the ED. Intubated and ROSC achieved in about 15 mins. Intubated in the ED and admitted to ICU. CTH was negative.  He was noted to have flickering movements in his extremities and EEG was obtained which showed myoclonic seizures every 2-3 mins lasting 30-45 secs with profound diffuse encephalopathy.  He was loaded with Keppra, continued on propofol. Neurology consulted for further management.  Patient is intubated and obtunded. He is unable to provide any history.  ROS   Patient is intubated and obtunded. He is unable to provide any ROS, PMH, PSH, FHx, SHx.  Past History   Past Medical History:  Diagnosis Date  . DM type 2 (diabetes mellitus, type 2) ([place])   . ESRD (end stage renal disease) on dialysis ([place])   . HLD (hyperlipidemia)   . HTN (hypertension)   . PVD (peripheral vascular disease) ([place])    Past Surgical History:  Procedure Laterality Date  . AMPUTATION FINGER / THUMB Bilateral    all fingers on right hand, thumb and 5th finger on left  . BELOW KNEE LEG AMPUTATION Bilateral    Family History  Problem Relation Age of Onset  . Hypertension Mother   . Diabetes Mother   . Hypertension Father    Social History   Socioeconomic History  . Marital status: Married    Spouse name: Not on file  . Number of children: Not on file  . Years of education: Not on file  . Highest education level: Not on file  Occupational  History  . Not on file  Tobacco Use  . Smoking status: Not on file  . Smokeless tobacco: Not on file  Substance and Sexual Activity  . Alcohol use: Not on file  . Drug use: Not on file  . Sexual activity: Not on file  Other Topics Concern  . Not on file  Social History Narrative  . Not on file   Social Determinants of Health   Financial Resource Strain: Not on file  Food Insecurity: Not on file  Transportation Needs: Not on file  Physical Activity: Not on file  Stress: Not on file  Social Connections: Not on file   No Known Allergies  Medications   Medications Prior to Admission  Medication Sig Dispense Refill Last Dose  . amLODipine (NORVASC) 10 MG tablet Take 10 mg by mouth daily.   04/02/2021 at Unknown time  . atorvastatin (LIPITOR) 80 MG tablet Take 80 mg by mouth at bedtime.   04/02/2021 at Unknown time  . B Complex-C-Zn-Folic Acid (DIALYVITE [id number] WITH ZINC) 0.8 MG TABS Take 1 tablet by mouth every evening.   04/02/2021 at Unknown time  . buprenorphine (BUTRANS) 20 MCG/HR PTWK Place 1 patch onto the skin See admin instructions. Apply 1 patch weekly as needed for intermitting pain not resolved by other pain medications.   03/30/2021 at Unknown time  .  carvedilol (COREG) 12.5 MG tablet Take 12.5 mg by mouth 2 (two) times daily.   04/02/2021 at 2000  . clopidogrel (PLAVIX) 75 MG tablet Take 75 mg by mouth daily.   04/02/2021 at Unknown time  . [employer] MELATONIN 3 MG TABS tablet Take 3 mg by mouth at bedtime.   04/02/2021 at Unknown time  . [employer] SENNA PLUS 8.6-50 MG tablet Take 2 tablets by mouth at bedtime.   04/02/2021 at Unknown time  . DULoxetine (CYMBALTA) 30 MG capsule Take 30 mg by mouth daily.   04/02/2021 at Unknown time  . famotidine (PEPCID) 20 MG tablet Take 20 mg by mouth at bedtime.   04/02/2021 at Unknown time  . hydrALAZINE (APRESOLINE) 100 MG tablet Take 100 mg by mouth every 8 (eight) hours.   04/02/2021 at Unknown time  . ibuprofen (ADVIL) 800 MG tablet Take 800 mg by  mouth 3 (three) times daily as needed for pain.   Past Week at Unknown time  . LANTUS SOLOSTAR 100 UNIT/ML Solostar Pen Inject 20 Units into the skin 2 (two) times daily as needed for high blood sugar. Do not take if blood sugar is less than 100   unk at Unknown time  . levETIRAcetam (KEPPRA) 500 MG tablet Take 1,000-1,500 mg by mouth See admin instructions. Take 2 tablets by mouth once daily all days EXCEPT, on Dialysis days (M-W-F) take 3 tablets by mouth once daily   04/02/2021 at Unknown time  . lidocaine-prilocaine (EMLA) cream Apply 1 application topically every Monday, Wednesday, and Friday with hemodialysis.   04/02/2021 at Unknown time  . oxyCODONE (ROXICODONE) 15 MG immediate release tablet Take 15 mg by mouth 5 (five) times daily as needed for pain.   04/02/2021 at Unknown time  . oxyCODONE-acetaminophen (PERCOCET/ROXICET) 5-325 MG tablet Take 1-2 tablets by mouth every 6 (six) hours as needed for pain.   04/02/2021 at Unknown time  . pregabalin (LYRICA) 100 MG capsule Take 100 mg by mouth at bedtime.   04/02/2021 at Unknown time  . sevelamer carbonate (RENVELA) 800 MG tablet Take 3,200 mg by mouth 3 (three) times daily.   04/02/2021 at Unknown time     Vitals   Vitals:   04/10/2021 1730 04/18/2021 1800 05/01/2021 1830 04/07/2021 1900  BP: 130/80 131/79 134/86 (!) 118/107  Pulse: 72 70 70 69  Resp: (!) 28 (!) 28 (!) 28 (!) 28  Temp: 98.24 F (36.8 C) 98.24 F (36.8 C) 98.6 F (37 C) 98.96 F (37.2 C)  TempSrc:      SpO2: 100% 100% 100% 100%  Weight:      Height:         Body mass index is 28.75 kg/m.  Physical Exam   General: Laying comfortably in bed; intubated. HENT: dried blood at mouth, normal oropharynx and mucosa. Normal external appearance of ears and nose. Neck: Supple, no pain or tenderness CV: No JVD. No peripheral edema. Pulmonary: Symmetric Chest rise. Not breathing over vent. Abdomen: Soft to touch, non-tender. Ext: No cyanosis, edema, BL BKA amputation, multiple  finger amputations. Skin: No rash. Normal palpation of skin.  Musculoskeletal: Normal digits and nails by inspection. Multiple digit amputations  Neurologic Examination on propofol 84mg/kg/min.  Mental status/Cognition: no response to voice or loud clap. No response to nares stimulation with a Qtip.  Brainstem reflexes: Minimally reactive pupils BL Corneals: Positive BL Cough: absent Gag: absent No breathing over went Dolls eyes negative(eyes move with head instead of being stationary)  Reflexes:  Right  Left Comments  Pectoralis      Biceps (C5/6) 1 1   Brachioradialis (C5/6) 1 1    Triceps (C6/7) 1 1    Patellar (L3/4) 2 2    Achilles (S1)      Hoffman      Plantar     Jaw jerk    Sensation:  Light touch No response to pinch in any extremities.   Pin prick    Temperature    Vibration   Proprioception    Coordination/Complex Motor:  Unable to assess.  Labs   CBC:  Recent Labs  Lab 04/29/2021 0615 04/26/2021 0619 04/27/2021 0655 04/07/2021 1058  WBC 13.6*  --   --  11.8*  NEUTROABS 7.0  --   --   --   HGB 9.5*   < > 11.9* 9.9*  HCT 34.3*   < > 35.0* 33.2*  MCV 81.9  --   --  75.3*  PLT 244  --   --  245   < > = values in this interval not displayed.    Basic Metabolic Panel:  Lab Results  Component Value Date   NA 139 04/19/2021   K 3.7 04/10/2021   CO2 19 (L) 04/04/2021   GLUCOSE 205 (H) 04/30/2021   BUN 60 (H) 04/26/2021   CREATININE 9.61 (H) 04/16/2021   CALCIUM 8.1 (L) 04/30/2021   GFRNONAA 6 (L) 04/06/2021   Lipid Panel: No results found for: LDLCALC HgbA1c:  Lab Results  Component Value Date   HGBA1C 5.0 04/26/2021   Urine Drug Screen: No results found for: LABOPIA, COCAINSCRNUR, LABBENZ, AMPHETMU, THCU, LABBARB  Alcohol Level No results found for: [facility]  CT Head without contrast: 1. No acute intracranial abnormality noted at this time. 2. Sinus disease as above.  An ETT is identified.  MRI Brain: pending  cEEG:  This study showed  myoclonic seizures, every 2-3 minutes, lasting 30-45 seconds as well as profound diffuse encephalopathy. 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   Impression   Juliano Spomer is a 60 y.o. male with PMH significant for ERSD on HD, PAD s/p BL BKA p/w SOB and witnessed cardiac arrest enroute. Initial rhythm of Asystole, ROSc in 15 mins. His neurologic examination on propofol 48mg/kg/min is notable for unresponsive to stimulation and absent brainstem reflexes. CTH with no acute abnormalities. EEG with diffusely suppressed background and myoclonic seizures lasting 30-45 secs every 2-3 mins.  Recommendations  - cEEG - Loaded with Keppra '20mg'$ /Kg IV once and started on Keppra '500mg'$  Q24 hours(renally adjusted) - I ordered Valproic Acid '1500mg'$  IV once. Post load levels ordered. - On propofol @ 617m/Kg/min. Plan to uptitrate overnight to suppress seizures or to burst suppression pattern. - Next steps after propofol will be to add Versed. - Avoid hyperthermia, hyponatremia, maintain euvolemia, keep MAPS > 65. - MRI Brain without contrast when stable and off cEEG. ______________________________________________________________________  This patient is critically ill and at significant risk of neurological worsening, death and care requires constant monitoring of vital signs, hemodynamics,respiratory and cardiac monitoring, neurological assessment, discussion with family, other specialists and medical decision making of high complexity. I spent 40 minutes of neurocritical care time  in the care of  this patient. This was time spent independent of any time provided by nurse practitioner or PA.  [facility] Pager Number [phone]/12/2020  8:34 PM   Thank you for the opportunity to take part in the care of this patient.  If you have any further questions, please contact the neurology consultation attending.  Signed,  [place] Pager Number [phone] _ _ _   _ __   _ __ _ _  __ __   _ __   __ _

## 2021-04-03 NOTE — Consult Note (Addendum)
Cardiology Consultation:   Patient ID: Leonhard Dowie MRN: [id number]; DOB: 09-11-1961  Admit date: 04/30/2021 Date of Consult: 04/27/2021  PCP:  [name], [place]  Cardiologist:  New to [facility] Provider:  No care team member to display Electrophysiologist:  None   Patient Profile:   Marquette Nessmith is a 60 y.o. male with a PMH of PAD s/p bilateral BKA, amputation of right hand fingers, and thumb and 5th fingers on the left hand, HTN, HLD, DM type 2, ESRD on HD, hepatitis C, and seizure disorder who is being seen today for the evaluation of post-arrest at the request of Dr. Tacy Learn.  History of Present Illness:   Mr. Lyng activated EMS this morning for respiratory distress. Patient was initially placed on CPAP, however then experienced respiratory failure requiring BVM. He then became pulseless requiring CPR en route to the ED. He was given epi and lidocaine with successful ROSC after ~15 minutes. Patient was intubated on arrival to the ED. Initial EKG c/f VT with rate 149. Labs with K 4.2>3.8, Mg 2.8, Cr 9.45, albumin 2.4, WBC 13.6, Hgb 9.5>11.6, PLT 244, lactate 10.3, BNP 2218, HsTrop 47, COVID 19/influenza negative. CXR with widespread right lung opacity c/w aspiration/PNA. CT Head without acute findings. He was started on IV antibiotics and admitted to [facility] for cooling protocol. Cardiology asked to evaluate.   Patient reported to have missed his dialysis on Friday with last session being Wednesday 03/30/21. He has had issues with ischemic necrosis of the fingers recently, s/p amputation of the left thumb and 5th fingers 02/2021. It does not appear that he has any known CAD or CHF. He does have several risk factors for CAD including PAD, HTN, HLD, and DM type 2. He has not had any prior ischemic testing.    Past Medical History:  Diagnosis Date  . DM type 2 (diabetes mellitus, type 2) ([place])   . ESRD (end stage renal  disease) on dialysis ([place])   . HLD (hyperlipidemia)   . HTN (hypertension)   . PVD (peripheral vascular disease) ([place])     Past Surgical History:  Procedure Laterality Date  . AMPUTATION FINGER / THUMB Bilateral    all fingers on right hand, thumb and 5th finger on left  . BELOW KNEE LEG AMPUTATION Bilateral      Home Medications:  Prior to Admission medications   Not on File    Inpatient Medications: Scheduled Meds: . amiodarone  150 mg Intravenous Once  . aspirin  300 mg Rectal NOW  . docusate  100 mg Per Tube BID  . heparin  5,000 Units Subcutaneous Q8H  . pantoprazole (PROTONIX) IV  40 mg Intravenous QHS  . polyethylene glycol  17 g Per Tube Daily   Continuous Infusions: . amiodarone     Followed by  . amiodarone    . ampicillin-sulbactam (UNASYN) IV    . lactated ringers    . niCARDipine 3 mg/hr (05/03/2021 0921)   PRN Meds: acetaminophen, docusate sodium, fentaNYL (SUBLIMAZE) injection, fentaNYL (SUBLIMAZE) injection, ondansetron (ZOFRAN) IV, polyethylene glycol  Allergies:   No Known Allergies  Social History:   Social History   Socioeconomic History  . Marital status: Married    Spouse name: Not on file  . Number of children: Not on file  . Years of education: Not on file  . Highest education level: Not on file  Occupational History  . Not on file  Tobacco Use  .  Smoking status: Not on file  . Smokeless tobacco: Not on file  Substance and Sexual Activity  . Alcohol use: Not on file  . Drug use: Not on file  . Sexual activity: Not on file  Other Topics Concern  . Not on file  Social History Narrative  . Not on file   Social Determinants of Health   Financial Resource Strain: Not on file  Food Insecurity: Not on file  Transportation Needs: Not on file  Physical Activity: Not on file  Stress: Not on file  Social Connections: Not on file  Intimate Partner Violence: Not on file    Family History:    Family History  Problem Relation Age of  Onset  . Hypertension Mother   . Diabetes Mother   . Hypertension Father      ROS:  Please see the history of present illness.   All other ROS reviewed and negative.     Physical Exam/Data:   Vitals:   04/21/2021 0845 05/02/2021 0900 04/06/2021 0915 04/30/2021 0933  BP: (!) 206/105 (!) 217/106 (!) 212/102   Pulse:      Resp: 10 (!) 0 (!) 0   Temp:    98.2 F (36.8 C)  TempSrc:    Axillary  SpO2:      Weight:      Height:        Intake/Output Summary (Last 24 hours) at 04/27/2021 0936 Last data filed at 04/08/2021 [id number] Gross per 24 hour  Intake --  Output 100 ml  Net -100 ml   Last 3 Weights 04/14/2021  Weight (lbs) 230 lb  Weight (kg) 104.327 kg     Body mass index is 28.75 kg/m.  General:  Critically ill appearing gentleman, intubated HEENT: sclera anicteric Neck: no JVD Vascular: AV fistula in LUE with palpable thrill Cardiac:  normal S1, S2; RRR; no murmurs, rubs, or gallops Lungs:  Intubated with coarse breath sounds without overt wheezing, rhonchi, or rales anteriorly  Abd: soft, no hepatomegaly  Ext: no edema Musculoskeletal:  S/p bilateral BKA with deep ulcer to anterior right knee, s/p amputation of right finger, s/p amputation of left thumb and 5th fingers Skin: warm and dry   Psych:  Unable to assess  EKG:  The EKG was personally reviewed and demonstrates:  Initially c/f VT. Repeat EKG with sinus rhythm, rate 85 bpm, no STE/D, no TWI; no significant change from EKG prior to this admission.  Telemetry:  Telemetry was personally reviewed and demonstrates:  Sinus rhythm  Relevant CV Studies: Echocardiogram pending  Laboratory Data:  High Sensitivity Troponin:   Recent Labs  Lab 04/04/2021 0615  TROPONINIHS 47*     Chemistry Recent Labs  Lab 04/30/2021 0615 05/02/2021 0619 04/28/2021 0655  NA 141 139 139  K 4.2 3.9 3.8  CL 97* 103  --   CO2 21*  --   --   GLUCOSE 240* 226*  --   BUN 55* 65*  --   CREATININE 9.45* 9.70*  --   CALCIUM 10.3  --   --    GFRNONAA 6*  --   --   ANIONGAP 23*  --   --     Recent Labs  Lab 04/04/2021 0615  PROT 6.4*  ALBUMIN 2.4*  AST 23  ALT 9  ALKPHOS 54  BILITOT 0.8   Hematology Recent Labs  Lab 04/23/2021 0615 04/26/2021 0619 04/30/2021 0655  WBC 13.6*  --   --   RBC 4.19*  --   --  HGB 9.5* 11.6* 11.9*  HCT 34.3* 34.0* 35.0*  MCV 81.9  --   --   MCH 22.7*  --   --   MCHC 27.7*  --   --   RDW 23.9*  --   --   PLT 244  --   --    BNP Recent Labs  Lab 04/10/2021 0616  BNP 2,218.2*    DDimer No results for input(s): DDIMER in the last 168 hours.   Radiology/Studies:  CT HEAD WO CONTRAST  Result Date: 04/27/2021 CLINICAL DATA:  Anoxic brain damage. Cardiac arrest with 15 to 20 minutes of CPR. EXAM: CT HEAD WITHOUT CONTRAST TECHNIQUE: Contiguous axial images were obtained from the base of the skull through the vertex without intravenous contrast. COMPARISON:  January 21, 2021 FINDINGS: Brain: No subdural, epidural, or subarachnoid hemorrhage identified. The ventricles and sulci are stable. 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 Cerebellum, brainstem, and basal cisterns are otherwise normal. No mass effect or midline shift. White matter changes are noted. The gray-white differentiation is identified. No acute cortical ischemia. Chronic tiny right frontal infarct. The sulci are not effaced. No mass effect or midline shift. Vascular: Calcified atherosclerosis is seen in the intracranial carotids. Skull: Normal. Negative for fracture or focal lesion. Sinuses/Orbits: Mild mucosal thickening in the maxillary sinuses. Mucosal thickening in the frontal sinuses and scattered ethmoid air cells. No air-fluid levels. Mastoid air cells and middle ears are well aerated. Other: An ET tube is identified. IMPRESSION: 1. No acute intracranial abnormality noted at this time. 2. Sinus disease as above.  An ETT is identified. Electronically Signed   By: Dorise Bullion III M.D   On: 05/01/2021 09:00    DG Chest Portable 1 View  Result Date: 04/25/2021 CLINICAL DATA:  OG tube placement. EXAM: PORTABLE CHEST 1 VIEW COMPARISON:  Earlier same day FINDINGS: Portable view of the lower chest and upper abdomen shows the tip of the NG tube in the proximal stomach. Although the proximal side port of the NG tube is obscured by overlying wires, is probably at the GE junction. Prominent gastric bubble noted with diffuse gaseous bowel distention associated. IMPRESSION: NG tube tip is in the stomach although the proximal side port is probably at the level of the GE junction. Tube could be advanced another 4-5 cm to ensure side port position in the stomach. Electronically Signed   By: Misty Stanley M.D.   On: 04/19/2021 08:06   DG Chest Portable 1 View  Result Date: 04/24/2021 CLINICAL DATA:  60 year old male status post CPR.  Intubated. EXAM: PORTABLE CHEST 1 VIEW COMPARISON:  Portable chest 11/24/2020 and earlier. FINDINGS: Portable AP supine view at 0625 hours. Endotracheal tube tip in good position just below the clavicles. Mediastinal contours are stable and within normal limits. Visualized tracheal air column is within normal limits. Pacer or resuscitation pads project over the lower chest. Allowing for portable technique the left lung appears clear but there is widespread abnormal right lung opacity, most confluent about the hilum. No pneumothorax or pleural effusion evident on this supine view. No acute osseous abnormality identified. IMPRESSION: 1. ET tube in good position. 2. Widespread right lung opacity most confluent about the hilum. Aspiration or pneumonia favored over asymmetric edema as the left lung appears to be clear. Electronically Signed   By: Genevie Ann M.D.   On: 04/07/2021 06:43     Assessment and Plan:   1. Post-arrest: patient presented with acute respiratory failure followed by cardiac  arrest while en route to the ED via EMS. CPR initiated and patient was given epi + lidocaine with successful  ROSC. He was intubated on arrival and EKG showed VT. Unclear what initial rhythm in the field was - notes indicate asystole at the time of arrest. Patient has no known CAD, however given extensive PVD, HTN, HLD, and DM type 2, it would not be surprising if he had obstructive disease. PCCM admitted for cooling protocol. Electrolytes are relatively stable despite missing dialysis session 04/01/21.  - Jihad Brownlow follow-up echocardiogram results - Natayla Cadenhead consider an ischemic evaluation with a LHC should patient make a meaningful recovery - Leilanni Halvorson start amiodarone for management of VT - Continue to monitor electrolytes closely to ensure K >4, Mg >2  2. Aspiration PNA: opacities in the right lung field noted on CXR this admission. Started on IV antibiotics - Continue management per primary team  3. HTN: BP markedly elevated this admission. Appears to be on amlodipine '10mg'$  daily, carvedilol 12.'5mg'$  BID (once daily on HD days), and hydralazine '100mg'$  TID at home. He was started on a nicardipine gtt - Continue management per primary team  4. PVD: extensive amputation history including bilateral BKA, amputation of fingers on right hand, and amputation of thumb/5th fingers on left hand. He is on plavix outpatient.  - Would resume plavix and statin when able.   5. Seizure disorder: on keppra at home, appears to have been started relatively recently during admission 01/2021. EEG ordered - Continue management per primary team  6. DM type 2: A1C 5.1 01/2021 - Continue management per primary team  7. HLD: no recent lipids on file. - Nellie Chevalier check FLP in AM for risk stratification - Continue statin when able  8. ESRD on HD: missed dialysis 04/01/21. Nephro following this admission. Planning for HD tomorrow.  - Continue management per primary team and nephro   Risk Assessment/Risk Scores:   N/A       For questions or updates, please contact [facility] Please consult [email or web address] for contact info under     Signed, Abigail Butts, PA-C  04/22/2021 9:36 AM  I have seen and examined this patient with Roby Lofts.  Agree with above, note added to reflect my findings.  On exam, RRR, intubated, multiple amputations.  The patient initially developed shortness of breath.  Chest x-ray shows a likely aspiration pneumonia.  In route to the hospital, he had cardiac arrest with ventricular tachycardia.  He received chest compressions.  He did have VT on his ECG.  It is certainly possible that his ventricular tachycardia was caused by hypoxia and his aspiration pneumonia.  We Krithik Mapel need to discuss this further with the patient once he has recovered to determine if left heart catheterization is reasonable.  We Jaquaya Coyle follow-up on his echo.  With his chronic medical problems, he is not a good candidate for ICD implant.  He may benefit from  long-term amiodarone.  Sidhant Helderman M. Allysa Governale MD 04/04/2021 11:21 AM

## 2021-04-03 NOTE — ED Notes (Signed)
Critical lactic acid 10.3 . Md made aware.

## 2021-04-03 NOTE — H&P (Addendum)
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 Upon EMS note patient missed his hemodialysis on Friday, started with increasing shortness of breath so he called 911, in route he started having worsening hypoxia leading to respiratory arrest and asystole, CPR was initiated, [name]'s airway was placed, he was brought in the emergency department CPR was in progress.  Patient was intubated on arrival to ED, ROSC achieved in about 15 minutes, post ROSC patient was noted to have V. Tach Initial work-up showed lactic acidosis and bilateral pulmonary infiltrates consistent with aspiration pneumonia.  Head CT was done which showed no acute intracranial abnormalities.  PCCM consulted for admission  Pertinent  Medical History   Past Medical History:  Diagnosis Date  . DM type 2 (diabetes mellitus, type 2) ([place])   . ESRD (end stage renal disease) on dialysis ([place])   . HLD (hyperlipidemia)   . HTN (hypertension)   . PVD (peripheral vascular disease) ([place])     Significant Hospital Events: Including procedures, antibiotic start and stop dates in addition to other pertinent events   . 5/1 ETT  Interim History / Subjective:    Objective   Blood pressure (!) 191/93, pulse 64, temperature 98.2 F (36.8 C), temperature source Axillary, resp. rate 20, height '6\' 3"'$  (1.905 m), weight 104.3 kg, SpO2 100 %.    Vent Mode: PRVC FiO2 (%):  [60 %-100 %] 60 % Set Rate:  [22 bmp-28 bmp] 28 bmp Vt Set:  [680 mL] 680 mL PEEP:  [5  cmH20-8 cmH20] 5 cmH20 Plateau Pressure:  [25 cmH20-35 cmH20] 25 cmH20   Intake/Output Summary (Last 24 hours) at 04/18/2021 1030 Last data filed at 04/15/2021 [id number] Gross per 24 hour  Intake 12.51 ml  Output 100 ml  Net -87.49 ml   Filed Weights   04/05/2021 [id number]  Weight: 104.3 kg    Examination:   Physical exam: General: Acute on chronically ill-appearing male, orally intubated HEENT: [place]/AT, eyes anicteric.  ETT and OGT in place Neuro: Eyes closed, flickering of eyelids.  Pupils 3 mm bilateral sluggishly reactive to light, no cough or gag. Chest: Bilateral crackles at bases right more than left, no wheezes or rhonchi Heart: Regular rate and rhythm, no murmurs or gallops Abdomen: Soft, nontender, nondistended, bowel sounds present Extremities: Bilateral BKA's, s/p amputation of multiple bilateral fingers Skin: No rash there is a wound on right knee with small pus (present on admission)     Labs/imaging that I havepersonally reviewed  (right click and "Reselect all SmartList Selections" daily)  5/1 CT head without contrast: No acute intracranial abnormalities Lactic acid 1.1 Previous: 7.1 6/53/181/99  Resolved Hospital Problem list     Assessment & Plan:  Status post asystolic cardiac arrest in the setting of hypoxia and hypercapnia Nonsustained ventricular tachycardia post ROSC Acute hypoxic/hypercapnic respiratory failure Aspiration pneumonia Lactic acidosis End-stage renal disease on hemodialysis, missed hemodialysis on Friday Severe peripheral arterial disease s/p bilateral BKA and multiple amputations now fingers bilaterally Diabetes type 2 Hypertensive urgency  Patient is started with difficulty breathing, he was noted to  be hypoxic before he went into asystolic cardiac arrest likely related to aspiration pneumonia Continue normothermia protocol Cardiology consult is appreciated Started on amiodarone for V. tach He will leave with ischemic work-up to rule out  obstructive coronary artery disease Monitor and supplement electrolytes Continue lung protective ventilation We will get EEG as patient was having flickering movement of eyelids Avoid sedation for now illness patient gets agitated Follow-up respiratory and blood cultures Continue broad-spectrum IV antibiotics Trend lactate and procalcitonin Nephrology has been consulted for dialysis, which will be done tomorrow Lispro sliding scale, CBG goal 140-180 Started on Cardene infusion with systolic goal [id number]   Best practice (right click and "Reselect all SmartList Selections" daily)  Diet:  NPO Pain/Anxiety/Delirium protocol (if indicated): No VAP protocol (if indicated): Yes DVT prophylaxis: Subcutaneous Heparin GI prophylaxis: PPI Glucose control:  SSI Yes Central venous access:  N/A Arterial line:  N/A Foley:  N/A Mobility:  bed rest  PT consulted: N/A Last date of multidisciplinary goals of care discussion [pending] Code Status:  full code Disposition: PCU  Labs   CBC: Recent Labs  Lab 04/24/2021 0615 04/18/2021 0619 04/08/2021 0655  WBC 13.6*  --   --   NEUTROABS 7.0  --   --   HGB 9.5* 11.6* 11.9*  HCT 34.3* 34.0* 35.0*  MCV 81.9  --   --   PLT 244  --   --     Basic Metabolic Panel: Recent Labs  Lab 04/28/2021 0615 04/15/2021 0619 04/19/2021 0655  NA 141 139 139  K 4.2 3.9 3.8  CL 97* 103  --   CO2 21*  --   --   GLUCOSE 240* 226*  --   BUN 55* 65*  --   CREATININE 9.45* 9.70*  --   CALCIUM 10.3  --   --   MG 2.8*  --   --    GFR: Estimated Creatinine Clearance: 10.3 mL/min (A) (by C-G formula based on SCr of 9.7 mg/dL (H)). Recent Labs  Lab 04/10/2021 0615 04/06/2021 0616  WBC 13.6*  --   LATICACIDVEN  --  10.3*    Liver Function Tests: Recent Labs  Lab 04/16/2021 0615  AST 23  ALT 9  ALKPHOS 54  BILITOT 0.8  PROT 6.4*  ALBUMIN 2.4*   No results for input(s): LIPASE, AMYLASE in the last 168 hours. No results for input(s): AMMONIA in the last 168  hours.  ABG    Component Value Date/Time   PHART 7.168 (LL) 04/19/2021 0655   PCO2ART 53.4 (H) 04/26/2021 0655   PO2ART 181 (H) 04/30/2021 0655   HCO3 19.4 (L) 04/18/2021 0655   TCO2 21 (L) 04/06/2021 0655   ACIDBASEDEF 9.0 (H) 04/10/2021 0655   O2SAT 99.0 04/25/2021 0655     Coagulation Profile: No results for input(s): INR, PROTIME in the last 168 hours.  Cardiac Enzymes: No results for input(s): CKTOTAL, CKMB, CKMBINDEX, TROPONINI in the last 168 hours.  HbA1C: No results found for: HGBA1C  CBG: No results for input(s): GLUCAP in the last 168 hours.  Review of Systems:   Unable to obtain as patient is intubated  Past Medical History:  He,  has a past medical history of DM type 2 (diabetes mellitus, type 2) ([place]), ESRD (end stage renal disease) on dialysis ([place]), HLD (hyperlipidemia), HTN (hypertension), and PVD (peripheral vascular disease) ([place]).   Surgical History:   Past Surgical History:  Procedure Laterality Date  . AMPUTATION FINGER / THUMB Bilateral    all  fingers on right hand, thumb and 5th finger on left  . BELOW KNEE LEG AMPUTATION Bilateral      Social History:      Family History:  His family history includes Diabetes in his mother; Hypertension in his father and mother.   Allergies No Known Allergies   Home Medications  Prior to Admission medications   Not on File     Critical care time:     Total critical care time: 46 minutes  Performed by: [place] care time was exclusive of separately billable procedures and treating other patients.   Critical care was necessary to treat or prevent imminent or life-threatening deterioration.   Critical care was time spent personally by me on the following activities: development of treatment plan with patient and/or surrogate as well as nursing, discussions with consultants, evaluation of patient's response to treatment, examination of patient, obtaining history from patient or  surrogate, ordering and performing treatments and interventions, ordering and review of laboratory studies, ordering and review of radiographic studies, pulse oximetry and re-evaluation of patient's condition.   Jacky Kindle MD [facility] See Amion for pager If no response to pager, please call [phone] until 7pm After 7pm, Please call E-link [phone]

## 2021-04-03 NOTE — Progress Notes (Signed)
Pt transported from ED to [facility] without any complications.

## 2021-04-03 NOTE — Progress Notes (Signed)
Received call from Dr. Hortense Ramal Patient is having frequent myoclonic seizures every 2 to 3 minutes lasting about 30 to 45 seconds, he betahistine.  Currently on propofol Neurology consult is requested for evaluation and management    Jacky Kindle MD [facility] See Amion for pager If no response to pager, please call [phone] until 7pm After 7pm, Please call E-link [phone]

## 2021-04-03 NOTE — Procedures (Addendum)
Patient Name: [name]  MRN: [id number]  Epilepsy Attending: Lora Havens  Referring Physician/Provider: Dr Jacky Kindle Date: 04/16/2021 Duration: 34.46 mins  Patient history: 60yo M s/p cardiac arrest. EEG to evaluate for seizure  Level of alertness:  comatose  AEDs during EEG study: Propofol  Technical aspects: This EEG study was done with scalp electrodes positioned according to the 10-20 International system of electrode placement. Electrical activity was acquired at a sampling rate of '500Hz'$  and reviewed with a high frequency filter of '70Hz'$  and a low frequency filter of '1Hz'$ . EEG data were recorded continuously and digitally stored.   Description: EEG showed continuous generalized background suppression. Patient was noted to have episodes of whole body brief jerks. Concomitant eeg showed generalized polyspike and wave consistent with myoclonic seizure, every 2-3 minutes, lasting 30-45 seconds. EEG was not reactive to tactile stimulation. Hyperventilation and photic stimulation were not performed.     ABNORMALITY - Myoclonic seizure, generalized  - Background suppression, generalized  IMPRESSION: This study showed myoclonic seizures, every 2-3 minutes, lasting 30-45 seconds as well as profound diffuse encephalopathy. 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   Dr Tacy Learn was notified  Lora Havens

## 2021-04-03 NOTE — Progress Notes (Signed)
*  PRELIMINARY RESULTS* Echocardiogram 2D Echocardiogram has been performed.  [name] 04/15/2021, 11:33 AM

## 2021-04-03 NOTE — ED Notes (Signed)
Pt is LT arm restricted

## 2021-04-03 NOTE — Progress Notes (Signed)
Chaplain responded to [place].  Per RN request, chaplain contacted wife Neoma Laming [phone]) that pt received 7 minutes of CPR in transit and is currently intubated.  Wife advised she would be making arrangements to get to the hospital.   Please contact if support is needed.    Luana Shu [phone]    04/23/2021 0900  Clinical Encounter Type  Visited With Patient and family together  Visit Type Initial;Trauma  Referral From Nurse  Consult/Referral To Chaplain  Stress Factors  Patient Stress Factors Health changes  Family Stress Factors Lack of knowledge

## 2021-04-03 NOTE — ED Provider Notes (Signed)
Attending Dr. Stark Jock present during procedure, please see his note for full H&P.    Procedure Name: Intubation Date/Time: 05/01/2021 6:22 AM Performed by: Amaryllis Dyke, PA-C Pre-anesthesia Checklist: Patient identified, Patient being monitored, Emergency Drugs available and Suction available Oxygen Delivery Method: Ambu bag Induction Type: Rapid sequence Ventilation: Mask ventilation without difficulty Laryngoscope Size: Glidescope and 3 Tube size: 7.5 mm Number of attempts: 1 Airway Equipment and Method: Rigid stylet Placement Confirmation: ETT inserted through vocal cords under direct vision,  CO2 detector and Breath sounds checked- equal and bilateral Secured at: 23 (@ the teeth) cm Tube secured with: ETT holder Dental Injury: Teeth and Oropharynx as per pre-operative assessment           Amaryllis Dyke, PA-C 04/12/2021 [id number]    Veryl Speak, MD 04/12/2021 [phone]

## 2021-04-03 NOTE — Progress Notes (Addendum)
Pt intermittently forcefully biting his tongue. Moderate amount of frank red blood removed orally. Pt is requiring two large OPA's to prevent pt damaging his tongue further. RN at bedside and agrees this is the best option at this time. ETT intact at 25cm at top lip. RRT will continue to monitor.

## 2021-04-03 NOTE — ED Notes (Signed)
Obtained iv.  No blood returned, though flushed well.

## 2021-04-03 NOTE — ED Triage Notes (Signed)
Pt bib EMS from home after 911 call stating pt was in resp distress. Pt noted to be gurgling by EMS and unable to tolerate Cpap. Pt was given 125 solumedrol and not long after pt became apneic and was assisted by BVM. Pt became pulseless and CPR was initiated at 0554. 1 of epi given PTA. Pt is on dialysis and missed tx on Friday.

## 2021-04-03 NOTE — Progress Notes (Signed)
EEG complete, overnight EEG continue - results pending

## 2021-04-04 ENCOUNTER — Inpatient Hospital Stay (HOSPITAL_COMMUNITY): Payer: Medicare Other

## 2021-04-04 DIAGNOSIS — J9602 Acute respiratory failure with hypercapnia: Secondary | ICD-10-CM | POA: Diagnosis not present

## 2021-04-04 DIAGNOSIS — N186 End stage renal disease: Secondary | ICD-10-CM

## 2021-04-04 DIAGNOSIS — E872 Acidosis, unspecified: Secondary | ICD-10-CM

## 2021-04-04 DIAGNOSIS — R569 Unspecified convulsions: Secondary | ICD-10-CM

## 2021-04-04 DIAGNOSIS — I10 Essential (primary) hypertension: Secondary | ICD-10-CM

## 2021-04-04 DIAGNOSIS — I739 Peripheral vascular disease, unspecified: Secondary | ICD-10-CM

## 2021-04-04 DIAGNOSIS — J9601 Acute respiratory failure with hypoxia: Secondary | ICD-10-CM | POA: Diagnosis not present

## 2021-04-04 DIAGNOSIS — Z992 Dependence on renal dialysis: Secondary | ICD-10-CM

## 2021-04-04 LAB — CBC
HCT: 25.7 % — ABNORMAL LOW (ref 39.0–52.0)
Hemoglobin: 7.9 g/dL — ABNORMAL LOW (ref 13.0–17.0)
MCH: 22.9 pg — ABNORMAL LOW (ref 26.0–34.0)
MCHC: 30.7 g/dL (ref 30.0–36.0)
MCV: 74.5 fL — ABNORMAL LOW (ref 80.0–100.0)
Platelets: 205 10*3/uL (ref 150–400)
RBC: 3.45 MIL/uL — ABNORMAL LOW (ref 4.22–5.81)
RDW: 22.8 % — ABNORMAL HIGH (ref 11.5–15.5)
WBC: 14.7 10*3/uL — ABNORMAL HIGH (ref 4.0–10.5)
nRBC: 0.2 % (ref 0.0–0.2)

## 2021-04-04 LAB — GLUCOSE, CAPILLARY
Glucose-Capillary: 251 mg/dL — ABNORMAL HIGH (ref 70–99)
Glucose-Capillary: 353 mg/dL — ABNORMAL HIGH (ref 70–99)
Glucose-Capillary: 84 mg/dL (ref 70–99)
Glucose-Capillary: 89 mg/dL (ref 70–99)
Glucose-Capillary: 86 mg/dL (ref 70–99)
Glucose-Capillary: 84 mg/dL (ref 70–99)
Glucose-Capillary: 84 mg/dL (ref 70–99)
Glucose-Capillary: 80 mg/dL (ref 70–99)

## 2021-04-04 LAB — MAGNESIUM: Magnesium: 2.2 mg/dL (ref 1.7–2.4)

## 2021-04-04 LAB — BASIC METABOLIC PANEL
Anion gap: 25 — ABNORMAL HIGH (ref 5–15)
BUN: 73 mg/dL — ABNORMAL HIGH (ref 8–23)
CO2: 15 mmol/L — ABNORMAL LOW (ref 22–32)
Calcium: 7.2 mg/dL — ABNORMAL LOW (ref 8.9–10.3)
Chloride: 98 mmol/L (ref 98–111)
Creatinine, Ser: 10.15 mg/dL — ABNORMAL HIGH (ref 0.61–1.24)
GFR, Estimated: 5 mL/min — ABNORMAL LOW (ref >60)
Glucose, Bld: 89 mg/dL (ref 70–99)
Potassium: 4.4 mmol/L (ref 3.5–5.1)
Sodium: 138 mmol/L (ref 135–145)

## 2021-04-04 LAB — PHOSPHORUS: Phosphorus: 8.4 mg/dL — ABNORMAL HIGH (ref 2.5–4.6)

## 2021-04-04 LAB — VALPROIC ACID LEVEL
Valproic Acid Lvl: 237 ug/mL (ref 50.0–100.0)
Valproic Acid Lvl: 48 ug/mL — ABNORMAL LOW (ref 50.0–100.0)
Valproic Acid Lvl: >600 ug/mL (ref 50.0–100.0)

## 2021-04-04 LAB — D-DIMER, QUANTITATIVE: D-Dimer, Quant: 10.71 ug/mL-FEU — ABNORMAL HIGH (ref 0.00–0.50)

## 2021-04-04 LAB — TRIGLYCERIDES: Triglycerides: 211 mg/dL — ABNORMAL HIGH (ref <150)

## 2021-04-04 MED ORDER — MIDAZOLAM 50MG/50ML (1MG/ML) PREMIX INFUSION
5.0000 mg/h | INTRAVENOUS | Status: DC
  Administered 2021-04-04: 5 mg/h via INTRAVENOUS
  Filled 2021-04-04 (×2): qty 50

## 2021-04-04 MED ORDER — PROSOURCE TF PO LIQD
45.0000 mL | Freq: Every day | ORAL | Status: DC
  Administered 2021-04-05 – 2021-04-06 (×2): 45 mL
  Filled 2021-04-04 (×2): qty 45

## 2021-04-04 MED ORDER — B COMPLEX-C PO TABS
1.0000 | ORAL_TABLET | Freq: Every day | ORAL | Status: DC
  Filled 2021-04-04: qty 1

## 2021-04-04 MED ORDER — VALPROATE SODIUM 100 MG/ML IV SOLN
250.0000 mg | Freq: Three times a day (TID) | INTRAVENOUS | Status: DC
  Administered 2021-04-04 – 2021-04-11 (×23): 250 mg via INTRAVENOUS
  Filled 2021-04-04 (×27): qty 2.5

## 2021-04-04 MED ORDER — MIDAZOLAM BOLUS VIA INFUSION
5.0000 mg | Freq: Once | INTRAVENOUS | Status: DC
  Filled 2021-04-04: qty 5

## 2021-04-04 MED ORDER — VITAL HIGH PROTEIN PO LIQD
1000.0000 mL | ORAL | Status: DC
  Administered 2021-04-04: 1000 mL

## 2021-04-04 MED ORDER — MIDAZOLAM BOLUS VIA INFUSION
10.0000 mg | Freq: Once | INTRAVENOUS | Status: AC
  Administered 2021-04-04: 10 mg via INTRAVENOUS
  Filled 2021-04-04: qty 10

## 2021-04-04 MED ORDER — PROSOURCE TF PO LIQD
45.0000 mL | Freq: Two times a day (BID) | ORAL | Status: DC
  Administered 2021-04-04: 45 mL
  Filled 2021-04-04: qty 45

## 2021-04-04 MED ORDER — ARTIFICIAL TEARS OPHTHALMIC OINT
TOPICAL_OINTMENT | OPHTHALMIC | Status: DC | PRN
  Administered 2021-04-04 (×2): 1 via OPHTHALMIC
  Filled 2021-04-04 (×2): qty 3.5

## 2021-04-04 MED ORDER — VITAL HIGH PROTEIN PO LIQD
1000.0000 mL | ORAL | Status: DC
  Administered 2021-04-05 (×3): 1000 mL

## 2021-04-04 MED ORDER — B COMPLEX-C PO TABS
1.0000 | ORAL_TABLET | Freq: Every day | ORAL | Status: DC
  Administered 2021-04-04 – 2021-04-11 (×8): 1
  Filled 2021-04-04 (×7): qty 1

## 2021-04-04 MED ORDER — PROPOFOL 1000 MG/100ML IV EMUL
80.0000 ug/kg/min | INTRAVENOUS | Status: DC
  Administered 2021-04-04 – 2021-04-05 (×8): 80 ug/kg/min via INTRAVENOUS
  Administered 2021-04-05: 60 ug/kg/min via INTRAVENOUS
  Administered 2021-04-05: 80 ug/kg/min via INTRAVENOUS
  Filled 2021-04-04 (×2): qty 100
  Filled 2021-04-04: qty 200

## 2021-04-04 NOTE — Progress Notes (Signed)
[facility] Progress Note  Subjective: seen in ICU, on vent and not responsive, on propofol gtt  Vitals:   04/04/21 1130 04/04/21 1200 04/04/21 1230 04/04/21 1300  BP: (!) 119/40 (!) 119/41 (!) 116/48 (!) 126/53  Pulse: (!) 52 (!) 52 (!) 51 (!) 51  Resp: _0 Temp: 97.7 F (36.5 C) 97.88 F (36.6 C) 98.06 F (36.7 C) 97.88 F (36.6 C)  TempSrc:      SpO2: 100% 100% 100% 100%  Weight:      Height:        Exam:  on vent ,sedated, NG in place to suction  no jvd  throat ett in place  Chest cta bilat and lat  Cor reg no RG  Abd soft ntnd no ascites   Ext no LE edema   Bilat BKA and multiple finger amps   Neuro on vent and sedated, not following commands   LUE AVF+bruit     OP HD: GO MWF   4h 24mn  425/500  73.5kg  2/2.5 bath  AVF 15g  Hep none  - mircera 150 q2 last 4/27, due 5/11  - venofer 50 q wk  - calcitriol 2.5 ug po tiw  - renvela 4 ac tid    Assessment/ Plan: 1. Cardiac arrest: EMS called for respiratory distress, patient deteriorated and eventually required king airway and CPR.  2. Resp failure: CXR with large R infiltrate concerning for aspiration.  On IV antibiotics per primary team. CXR today shows essentially full clearing of prior diffuse R sided infiltrates, c/w aspiration.  3.  ESRD:  Missed HD 4/29. Labs without hyperkalemia. Plan HD sometime today in ICU.  4.  Hypertension/volume: BP is stable now.  Started on IV amiodarone for v. Fib. Cardene gtt is off now. Bed weight likely inaccurate, not significantly edematous, f/u CXR today shows no edema. Plan UF 2-3 L today as BP's tolerate.  5.  Anemia: Hgb at goal. Not due for ESA dose 6.  Metabolic bone disease: Calcium elevated. NPO, calcitriol and renvela on hold.  7.  Nutrition:  NPO 8. PVD: Patient with history of severe PVD and multiple amputations. Cardiology consulting and planning to check echocardiogram, consider cardiac cath if patient starts to improve.   Rob  Viveca Beckstrom 04/04/2021, 1:24 PM   Recent Labs  Lab 04/21/2021 2018 04/18/2021 2143 04/04/21 0410 04/04/21 0532  K 4.7 3.9  --  4.4  BUN 70*  --   --  73*  CREATININE 9.79*  --   --  10.15*  CALCIUM 7.8*  --   --  7.2*  PHOS  --   --  8.4*  --   HGB  --  9.5* 7.9*  --    Inpatient medications: . chlorhexidine gluconate (MEDLINE KIT)  15 mL Mouth Rinse BID  . Chlorhexidine Gluconate Cloth  6 each Topical Q0600  . docusate  100 mg Per Tube BID  . feeding supplement (PROSource TF)  45 mL Per Tube BID  . feeding supplement (VITAL HIGH PROTEIN)  1,000 mL Per Tube Q24H  . heparin  5,000 Units Subcutaneous Q8H  . insulin aspart  0-9 Units Subcutaneous Q4H  . mouth rinse  15 mL Mouth Rinse 10 times per day  . pantoprazole (PROTONIX) IV  40 mg Intravenous QHS  . polyethylene glycol  17 g Per Tube Daily   . amiodarone 30 mg/hr (04/04/21 1300)  . ampicillin-sulbactam (UNASYN) IV Stopped (04/04/21 0827)  . lactated ringers 10  mL/hr at 04/04/21 1300  . levETIRAcetam    . propofol (DIPRIVAN) infusion 80 mcg/kg/min (04/04/21 1300)  . valproate sodium Stopped (04/04/21 0955)   acetaminophen, artificial tears, docusate, fentaNYL (SUBLIMAZE) injection, ondansetron (ZOFRAN) IV, polyethylene glycol

## 2021-04-04 NOTE — Progress Notes (Signed)
Brief Neuro Update:  Earlier VPA levels likely incorrect. VPA levels at 48, slightly below goal. Will start him on '250mg'$  Q8h. Of note, MAPS down to 60 after starting Versed. Will stop Versed for now and monitor EEG.  [place] Pager Number [phone]

## 2021-04-04 NOTE — Progress Notes (Signed)
LTM maintenance completed; no skin breakdown was seen at Fp1 or Fp2. Removed head wrap.

## 2021-04-04 NOTE — Progress Notes (Signed)
Brief Neuro Update:  Myoclonic seizures improved with Valproic acid load of '1500mg'$  IV once and increased propofol dosing to 182mg/Kg/min with mostly suppression pattern on EEG.  EEG worsening again around 3AM. Ordered Versed bolus of '10mg'$  and Versed gtt at '5mg'$ /hr.  SOktibbehaPager Number [phone]

## 2021-04-04 NOTE — Progress Notes (Signed)
Subjective: Patient had difficulty with toe seizures overnight, currently on high dose of propofol, Keppra and Depakote.  ROS: Unable to obtain due to poor mental status  Examination  Vital signs in last 24 hours: Temp:  [94.64 F (34.8 C)-99.1 F (37.3 C)] 98.06 F (36.7 C) (05/02 1230) Pulse Rate:  [51-78] 51 (05/02 1230) Resp:  [0-28] 16 (05/02 1230) BP: (89-146)/(32-121) 116/48 (05/02 1230) SpO2:  [100 %] 100 % (05/02 1230) FiO2 (%):  [35 %-40 %] 40 % (05/02 1200) Weight:  [86.4 kg] 86.4 kg (05/02 0500)  General: lying in bed, NAD CVS: pulse-normal rate and rhythm RS: Intubated, coarse breath sounds bilaterally Extremities: Bilateral BKA, right hand amputation, significant peripheral arterial disease and left hand fingers Neuro: Comatose, does not open eyes to noxious stimuli, does not follow commands, pupils about 3 mm and not reactive to light, corneal reflex absent, gag reflex absent, does not withdraw to noxious stimuli in any extremity  Basic Metabolic Panel: Recent Labs  Lab 04/09/2021 0615 05/02/2021 0619 05/03/2021 0655 04/20/2021 1058 05/02/2021 2011 04/18/2021 2018 04/27/2021 2143 04/04/21 0410 04/04/21 0532  NA 141 139   < > 139 139 139 140  --  138  K 4.2 3.9   < > 3.7 3.8 4.7 3.9  --  4.4  CL 97* 103  --  98  --  98  --   --  98  CO2 21*  --   --  19*  --  18*  --   --  15*  GLUCOSE 240* 226*  --  205*  --  83  --   --  89  BUN 55* 65*  --  60*  --  70*  --   --  73*  CREATININE 9.45* 9.70*  --  9.61*  --  9.79*  --   --  10.15*  CALCIUM 10.3  --   --  8.1*  --  7.8*  --   --  7.2*  MG 2.8*  --   --   --   --   --   --  2.2  --   PHOS  --   --   --   --   --   --   --  8.4*  --    < > = values in this interval not displayed.    CBC: Recent Labs  Lab 04/30/2021 0615 04/09/2021 0619 05/02/2021 0655 05/01/2021 1058 04/19/2021 2011 04/16/2021 2143 04/04/21 0410  WBC 13.6*  --   --  11.8*  --   --  14.7*  NEUTROABS 7.0  --   --   --   --   --   --   HGB 9.5*   < > 11.9*  9.9* 10.2* 9.5* 7.9*  HCT 34.3*   < > 35.0* 33.2* 30.0* 28.0* 25.7*  MCV 81.9  --   --  75.3*  --   --  74.5*  PLT 244  --   --  245  --   --  205   < > = values in this interval not displayed.     Coagulation Studies: Recent Labs    05/01/2021 1058  LABPROT 15.2  INR 1.2    Imaging CT head without contrast 04/04/2019: No acute abnormality.  ASSESSMENT AND PLAN: 60 year old male status post cardiorespiratory arrest.  Cardiorespiratory arrest Anoxic/hypoxic brain injury - This study initially showed myoclonic seizures, every 2-3 minutes, lasting 30-45 seconds.  Seizures stopped after around 2300 on 04/19/2021.  However, EEG continued to show generalized epileptogenicity as well as profound diffuse encephalopathy. 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   Recommendations -Continue propofol at current dose of 80 mcg/ hour, continue Keppra 500 mg twice daily, Depakote 250 mg every 8 hours -Patient has multiple comorbidities, had prolonged downtime of about 15 minutes, was having frequent myoclonic seizures on arrival which were very difficult to control, continues to have generalized periodic epileptiform discharges.  I suspect patient has sustained severe anoxic/hypoxic brain injury with minimal to no chances of meaningful neurologic recovery. -For now we will continue to treat patient with goal for seizure suppression till we can discuss goals of care with patient's family -Management of rest of comorbidities per primary team  CRITICAL CARE Performed by: Lora Havens   Total critical care time: 40 minutes  Critical care time was exclusive of separately billable procedures and treating other patients.  Critical care was necessary to treat or prevent imminent or life-threatening deterioration.  Critical care was time spent personally by me on the following activities: development of treatment plan with patient and/or surrogate as  well as nursing, discussions with consultants, evaluation of patient's response to treatment, examination of patient, obtaining history from patient or surrogate, ordering and performing treatments and interventions, ordering and review of laboratory studies, ordering and review of radiographic studies, pulse oximetry and re-evaluation of patient's condition.   Zeb Comfort Epilepsy [facility] For questions after 5pm please refer to AMION to reach the Neurologist on call

## 2021-04-04 NOTE — Progress Notes (Signed)
PCCM interval progress note:  Discussed EEG and myoclonus with patient's wife.  She understands that his prognosis is guarded and possibly very poor, but would like to continue TTM and supportive care with neuroprognostication after this.   Otilio Carpen Mercer Peifer, PA-C

## 2021-04-04 NOTE — Progress Notes (Signed)
Initial Nutrition Assessment  DOCUMENTATION CODES:   Non-severe (moderate) malnutrition in context of chronic illness  INTERVENTION:   Tube feeding:  -Vital High Protein @ 40 ml/hr via OG -Increase by 10 ml Q4 hours to goal rate of 60 ml/hr (1440 ml) -45 ml ProSource TF daily -Add B complex with vitamin C to account for HD losses   Provides: 1480 kcals (2525 kcal with propofol), 137 grams protein, 1204 ml free water.   NUTRITION DIAGNOSIS:   Moderate Malnutrition related to chronic illness (ESRD on HD) as evidenced by mild fat depletion,moderate muscle depletion.  GOAL:   Patient will meet greater than or equal to 90% of their needs  MONITOR:   Vent status,TF tolerance,Skin,Weight trends,Labs,I & O's  REASON FOR ASSESSMENT:   Ventilator,Consult Enteral/tube feeding initiation and management  ASSESSMENT:   Patient with PMH significant for DM, ESRD on HD, HLD, HTN, and PVD s/p bilateral BKA. Presents this admission with asystolic cardiac arrest in the setting of hypoxia and hypercapnia.  Pt discussed during ICU rounds and with RN.   [id number]. Noted missed outpatient HD PTA, plan HD today. EEG to determine neuro status. Propofol being weaned. Xray confirms OG in proximal stomach. Vital High Protein started at 40 ml/hr. Titrate to goal.   EDW: 73.5 kg  Current weight: 86.4 kg   Patient is currently intubated on ventilator support MV: 9.3 L/min Temp (24hrs), Avg:97.6 F (36.4 C), Min:96.1 F (35.6 C), Max:99.1 F (37.3 C)  Propofol: 41.5 ml/hr- provides 1045 kcal from lipids daily   Drips: LR @ 10 ml/hr, propofol (trending down) Medications: SS novolog, miralax Labs: Phosphorus 8.4 (H)    NUTRITION - FOCUSED PHYSICAL EXAM:  Flowsheet Row Most Recent Value  Orbital Region Mild depletion  Upper Arm Region Moderate depletion  Thoracic and Lumbar Region Unable to assess  Buccal Region Mild depletion  Temple Region Mild depletion  Clavicle Bone Region Moderate  depletion  Clavicle and Acromion Bone Region Moderate depletion  Scapular Bone Region Unable to assess  Dorsal Hand No depletion  Patellar Region Unable to assess  Anterior Thigh Region Unable to assess  Posterior Calf Region Unable to assess  Edema (RD Assessment) Unable to assess  Hair Reviewed  Eyes Unable to assess  Mouth Unable to assess  Skin Reviewed  Nails Reviewed     Diet Order:   Diet Order            Diet NPO time specified  Diet effective now                 EDUCATION NEEDS:   Not appropriate for education at this time  Skin:  Skin Assessment: Skin Integrity Issues: Skin Integrity Issues:: Other (Comment) Other: wound- R knee, hands, penis  Last BM:  5/1  Height:   Ht Readings from Last 1 Encounters:  04/26/2021 '6\' 3"'$  (1.905 m)    Weight:   Wt Readings from Last 1 Encounters:  04/04/21 86.4 kg    BMI:  Body mass index is 23.81 kg/m.  Estimated Nutritional Needs:   Kcal:  2150-2500 kcal  Protein:  130-145 grams  Fluid:  1000 ml + UOP   Mariana Single RD, LDN Clinical Nutrition Pager listed in [place]

## 2021-04-04 NOTE — Procedures (Addendum)
Patient Name: [name]  MRN: [id number]  Epilepsy Attending: Lora Havens  Referring Physician/Provider: Dr Jacky Kindle Duration: 04/12/2021 1529 to 04/04/2021 1529  Patient history: 59yo M s/p cardiac arrest. EEG to evaluate for seizure  Level of alertness:  comatose  AEDs during EEG study: Propofol, LEV, VPA  Technical aspects: This EEG study was done with scalp electrodes positioned according to the 10-20 International system of electrode placement. Electrical activity was acquired at a sampling rate of '500Hz'$  and reviewed with a high frequency filter of '70Hz'$  and a low frequency filter of '1Hz'$ . EEG data were recorded continuously and digitally stored.   Description: Initially, patient was noted to have episodes of whole body brief jerks. Concomitant eeg showed generalized polyspike and wave consistent with myoclonic seizure, every 2-3 minutes, lasting 30-45 seconds. Gradually, the frequency of seizures increased in the started happening every 30 to 45 seconds. After around 2300 on 04/20/2021 as medications were adjusted, EEG showed generalized background attenuation. After 0230 on 04/04/2021, EEG started worsening again and showed burst suppression pattern with highly epileptiform bursts lasting 10 seconds alternating with EEG suppression lasting about 10 seconds.  Again after around 4 AM, EEG showed continuous generalized background attenuation. After 9 AM, EEG showed generalized periodic epileptiform discharges with overriding fast activity at 0.5-'1Hz'$ .  ABNORMALITY - Myoclonic seizure, generalized  -Burst suppression with highly epileptiform discharges, generalized  IMPRESSION: This study  initially showed myoclonic seizures, every 2-3 minutes, lasting 30-45 seconds.  Seizures stopped after around 2300 on 04/06/2021.  However, EEG continued to show generalized epileptogenicity as well as profound diffuse encephalopathy. In setting of cardiac arrest, this is most likely suggestive of  anoxic-hypoxic injury.   Garrick Midgley Barbra Sarks

## 2021-04-04 NOTE — Plan of Care (Signed)

## 2021-04-04 NOTE — Progress Notes (Signed)
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 Upon EMS note patient missed his hemodialysis on Friday, started with increasing shortness of breath so he called 911, in route he started having worsening hypoxia leading to respiratory arrest and asystole, CPR was initiated, [name]'s airway was placed, he was brought in the emergency department CPR was in progress.  Patient was intubated on arrival to ED, ROSC achieved in about 15 minutes, post ROSC patient was noted to have V. Tach Initial work-up showed lactic acidosis and bilateral pulmonary infiltrates consistent with aspiration pneumonia.  Head CT was done which showed no acute intracranial abnormalities.  PCCM consulted for admission  Pertinent  Medical History   Past Medical History:  Diagnosis Date  . DM type 2 (diabetes mellitus, type 2) ([place])   . ESRD (end stage renal disease) on dialysis ([place])   . HLD (hyperlipidemia)   . HTN (hypertension)   . PVD (peripheral vascular disease) ([place])     Significant Hospital Events: Including procedures, antibiotic start and stop dates in addition to other pertinent events   . 5/1 ETT . 5/2 evidence of myoclonus on EEG, on VPA with ongoing video EEG  Interim History / Subjective:  Debated and sedated this morning with video EEG, on amiodarone  Plan for dialysis today  Objective   Blood pressure (!) 117/38, pulse (!) 52, temperature 97.7 F (36.5 C), resp. rate 16, height '6\' 3"'$  (1.905 m), weight 86.4 kg, SpO2 100 %.    Vent Mode: PRVC FiO2 (%):  [35 %-40 %] 40 % Set Rate:  [16  bmp-28 bmp] 16 bmp Vt Set:  [580 mL-680 mL] 580 mL PEEP:  [5 cmH20] 5 cmH20 Plateau Pressure:  [15 cmH20-27 cmH20] 17 cmH20   Intake/Output Summary (Last 24 hours) at 04/04/2021 0745 Last data filed at 04/04/2021 0700 Gross per 24 hour  Intake 2164.09 ml  Output --  Net 2164.09 ml   Filed Weights   04/25/2021 [id number] 04/04/21 0500  Weight: 104.3 kg 86.4 kg     General: Chronically and acutely ill appearing male intubated and sedated HEENT: MM pink/moist, sclera anicteric, ETT in place Neuro: Sedated on propofol, RASS -5 CV: s1s2 RRR, no m/r/g PULM: Mechanical breath sounds bilaterally without rhonchi or wheezing, tachypnea or accessory muscle use, good oxygen saturations on 40% FiO2 and PEEP of 5 GI: soft, bsx4 active Extremities: warm/dry, amputated right upper extremity digits and bilateral BKA's Skin: Right knee wound present on admission      Labs/imaging that I havepersonally reviewed  (right click and "Reselect all SmartList Selections" daily)   BMP- AGMA with bicarb of 15 and gap of 25 CBC-hemoglobin 7.9 down from 9.5, white blood cell count uptrending to 14.7 5/2 blood cultures x2>> pending  Resolved Hospital Problem list     Assessment & Plan:    Acute respiratory arrest leading to PEA cardiac arrest with subsequent NSVT, aspiration pneumonia Wife reports patient had missed dialysis and was not taking his home Keppra, suspect he seized and had an aspiration event leading to hypoxic respiratory failure.  Echo showed EF 50 to 55% with grade 1 diastolic failure  P: -  Continue normothermia and amiodarone gtt. -Cardiology following if meaningful neurologic recovery will likely need LHC -Monitor electrolytes -Continue Unasyn, follow respiratory culture and blood cultures -Maintain full vent support with SAT/SBT as tolerated -titrate Vent setting to maintain SpO2 greater than or equal to 90%. -HOB elevated 30 degrees. -Plateau pressures less than 30 cm H20.  -Follow  chest x-ray, ABG prn.   -Bronchial hygiene and RT/bronchodilator protocol.   Seizure disorder  EEG initially with myoclonic seizures every 2 to 3 minutes, ceased on 5 1 however EEG continues to show generalized epileptogenicity in the city and profound diffuse encephalopathy. Initial head CT negative P: -Appreciate neurology recommendations, started on Keppra, Versed valproate and on 100 mcg propofol   ESRD -Appreciate nephrology consult, plan for HD today   Hypertension Initially in hypertensive emergency and started on Cardene, blood pressure predictably dropped after high-dose propofol initiated P: -Hold home Norvasc and hydralazine -Blood pressure soft but not currently requiring pressors   Type 2 diabetes Glucose 80s, home Lantus held P: -Continue SSI and start tube feeds, resume Lantus as needed    AGMA Suspect secondary to lactic acidosis and ESRD with missed dialysis P: -Continue to monitor BMP, repeat lactic acid       Best practice (right click and "Reselect all SmartList Selections" daily)  Diet:  Tube Feed  Pain/Anxiety/Delirium protocol (if indicated): Yes (RASS goal -3) VAP protocol (if indicated): Yes DVT prophylaxis: Subcutaneous Heparin GI prophylaxis: PPI Glucose control:  SSI Yes Central venous access:  N/A Arterial line:  N/A Foley:  N/A Mobility:  bed rest  PT consulted: N/A Last date of multidisciplinary goals of care discussion [pending]: Wife updated with current status 5/2 Code Status:  full code Disposition: PCU  Labs   CBC: Recent Labs  Lab 05/01/2021 0615 04/09/2021 0619 04/17/2021 0655 04/29/2021 1058 04/14/2021 2011 04/15/2021 2143 04/04/21 0410  WBC 13.6*  --   --  11.8*  --   --  14.7*  NEUTROABS 7.0  --   --   --   --   --   --   HGB 9.5*   < > 11.9* 9.9* 10.2* 9.5* 7.9*  HCT 34.3*   < > 35.0* 33.2* 30.0* 28.0* 25.7*  MCV 81.9  --   --  75.3*  --   --  74.5*  PLT 244  --   --  245  --   --  205   < > = values in this  interval not displayed.    Basic Metabolic Panel: Recent Labs  Lab 04/10/2021 0615 04/30/2021 0619 04/25/2021 0655 04/26/2021 1058 04/20/2021 2011 04/30/2021 2018 04/25/2021 2143 04/04/21 0410 04/04/21 0532  NA 141 139   < > 139 139 139 140  --  138  K 4.2 3.9   < > 3.7 3.8 4.7 3.9  --  4.4  CL 97* 103  --  98  --  98  --   --  98  CO2 21*  --   --  19*  --  18*  --   --  15*  GLUCOSE 240* 226*  --  205*  --  83  --   --  89  BUN 55* 65*  --  60*  --  70*  --   --  73*  CREATININE 9.45* 9.70*  --  9.61*  --  9.79*  --   --  10.15*  CALCIUM 10.3  --   --  8.1*  --  7.8*  --   --  7.2*  MG 2.8*  --   --   --   --   --   --  2.2  --   PHOS  --   --   --   --   --   --   --  8.4*  --    < > = values in this interval not displayed.   GFR: Estimated Creatinine Clearance: 9 mL/min (A) (by C-G formula based on SCr of 10.15 mg/dL (H)). Recent Labs  Lab 04/07/2021 0615 05/03/2021 0616 04/22/2021 1058 04/04/21 0410  WBC 13.6*  --  11.8* 14.7*  LATICACIDVEN  --  10.3* 5.7*  --     Liver Function Tests: Recent Labs  Lab 04/09/2021 0615  AST 23  ALT 9  ALKPHOS 54  BILITOT 0.8  PROT 6.4*  ALBUMIN 2.4*   No results for input(s): LIPASE, AMYLASE in the last 168 hours. No results for input(s): AMMONIA in the last 168 hours.  ABG    Component Value Date/Time   PHART 7.434 05/02/2021 2143   PCO2ART 31.7 (L) 04/06/2021 2143   PO2ART 186 (H) 04/05/2021 2143   HCO3 21.2 04/19/2021 2143   TCO2 22 04/28/2021 2143   ACIDBASEDEF 2.0 04/10/2021 2143   O2SAT 100.0 05/01/2021 2143     Coagulation Profile: Recent Labs  Lab 04/07/2021 1058  INR 1.2    Cardiac Enzymes: No results for input(s): CKTOTAL, CKMB, CKMBINDEX, TROPONINI in the last 168 hours.  HbA1C: Hgb A1c MFr Bld  Date/Time Value Ref Range Status  04/18/2021 10:58 AM 5.0 4.8 - 5.6 % Final    Comment:    (NOTE) Pre diabetes:          5.7%-6.4%  Diabetes:              >6.4%  Glycemic control for   <7.0% adults with diabetes      CBG: Recent Labs  Lab 04/08/2021 2348 04/04/21 0009 04/04/21 0359 04/04/21 0404 04/04/21 0743  GLUCAP 353* 89 251* 84 86    Review of Systems:   Unable to obtain as patient is intubated  Past Medical History:  He,  has a past medical history of DM type 2 (diabetes mellitus, type 2) ([place]), ESRD (end stage renal disease) on dialysis ([place]), HLD (hyperlipidemia), HTN (hypertension), and PVD (peripheral vascular disease) ([place]).   Surgical History:   Past Surgical History:  Procedure Laterality Date  . AMPUTATION FINGER / THUMB Bilateral    all fingers on right hand, thumb and 5th finger on left  . BELOW KNEE LEG AMPUTATION Bilateral      Social History:      Family History:  His family history includes Diabetes in his mother; Hypertension in his father and mother.   Allergies No Known Allergies   Home Medications  Prior to Admission medications   Not on File     Critical care time: 35 minutes    CRITICAL CARE Performed by: Otilio Carpen Brogan England   Total critical care time: 35 minutes  Critical care time was exclusive of separately billable procedures and treating other patients.  Critical care was necessary to treat or prevent imminent or life-threatening deterioration.  Critical care was time spent personally by me on the following activities: development of treatment plan with patient and/or surrogate as well as nursing, discussions with consultants, evaluation of patient's response to treatment, examination of patient, obtaining history from patient or surrogate, ordering and performing treatments and interventions, ordering and review of laboratory studies, ordering  and review of radiographic studies, pulse oximetry and re-evaluation of patient's condition.   Otilio Carpen Dimitris Shanahan, PA-C [facility] See Amion for pager If no response to pager , please call [phone] until 7pm After 7:00 pm call Elink  [phone]?[place]

## 2021-04-04 NOTE — Progress Notes (Signed)
Progress Note  Patient Name: [name] Date of Encounter: 04/05/2021  [place] Cardiologist: None   Subjective   EEG with evidence of myoclonus. Video EEG ongoing. Remains intubated and sedated on the vent.  Overall suspect poor prognosis per Neurology  Inpatient Medications    Scheduled Meds: . B-complex with vitamin C  1 tablet Per Tube Daily  . chlorhexidine gluconate (MEDLINE KIT)  15 mL Mouth Rinse BID  . Chlorhexidine Gluconate Cloth  6 each Topical Q0600  . docusate  100 mg Per Tube BID  . feeding supplement (PROSource TF)  45 mL Per Tube Daily  . feeding supplement (VITAL HIGH PROTEIN)  1,000 mL Per Tube Q24H  . heparin  5,000 Units Subcutaneous Q8H  . insulin aspart  0-9 Units Subcutaneous Q4H  . mouth rinse  15 mL Mouth Rinse 10 times per day  . pantoprazole (PROTONIX) IV  40 mg Intravenous QHS  . polyethylene glycol  17 g Per Tube Daily   Continuous Infusions: . ampicillin-sulbactam (UNASYN) IV 200 mL/hr at 04/05/21 0800  . lactated ringers Stopped (04/05/21 0746)  . levETIRAcetam Stopped (04/04/21 1702)  . propofol (DIPRIVAN) infusion 80 mcg/kg/min (04/05/21 0820)  . valproate sodium Stopped (04/05/21 0648)   PRN Meds: acetaminophen, artificial tears, docusate, fentaNYL (SUBLIMAZE) injection, hydrALAZINE, ondansetron (ZOFRAN) IV, polyethylene glycol   Vital Signs    Vitals:   04/05/21 0645 04/05/21 0700 04/05/21 0800 04/05/21 0837  BP: (!) 164/76 (!) 165/80 (!) 170/81 (!) 164/79  Pulse: (!) 52 (!) 52 (!) 52   Resp: _0 Temp: (!) 97.2 F (36.2 C) (!) 97.2 F (36.2 C) (!) 97.16 F (36.2 C)   TempSrc:   Esophageal   SpO2: 100% 100% 100%   Weight:   84.6 kg   Height:        Intake/Output Summary (Last 24 hours) at 04/05/2021 0925 Last data filed at 04/05/2021 0800 Gross per 24 hour  Intake 2708.74 ml  Output 3166 ml  Net -457.26 ml   Last 3 Weights 04/05/2021 04/05/2021 04/04/2021  Weight (lbs) 186 lb 8.2 oz 186 lb 15.2 oz 190 lb 7.6  oz  Weight (kg) 84.6 kg 84.8 kg 86.4 kg      Telemetry    Sinus bradycardia - Personally Reviewed  ECG    No new tracing- Personally Reviewed  Physical Exam   GEN: Intubated, sedated Neck: No JVD Cardiac: Bradycardic, regular no murmurs, rubs, or gallops.  Respiratory: Coarse vent sounds GI: Soft, nontender, non-distended  MS: Bilateral BKA, no edema Neuro:  Sedated Psych: Unable to assess  Labs    High Sensitivity Troponin:   Recent Labs  Lab 04/07/2021 0615 04/24/2021 1058  TROPONINIHS 47* 253*      Chemistry Recent Labs  Lab 04/15/2021 0615 04/22/2021 0619 04/10/2021 2018 05/02/2021 2143 04/04/21 0532 04/05/21 0015  NA 141   < > 139 140 138 134*  K 4.2   < > 4.7 3.9 4.4 3.6  CL 97*   < > 98  --  98 95*  CO2 21*   < > 18*  --  15* 24  GLUCOSE 240*   < > 83  --  89 81  BUN 55*   < > 70*  --  73* 32*  CREATININE 9.45*   < > 9.79*  --  10.15* 4.59*  CALCIUM 10.3   < > 7.8*  --  7.2* 7.7*  PROT 6.4*  --   --   --   --   --  ALBUMIN 2.4*  --   --   --   --   --   AST 23  --   --   --   --   --   ALT 9  --   --   --   --   --   ALKPHOS 54  --   --   --   --   --   BILITOT 0.8  --   --   --   --   --   GFRNONAA 6*   < > 6*  --  5* 14*  ANIONGAP 23*   < > 23*  --  25* 15   < > = values in this interval not displayed.     Hematology Recent Labs  Lab 04/21/2021 1058 04/22/2021 2011 04/26/2021 2143 04/04/21 0410 04/05/21 0015  WBC 11.8*  --   --  14.7* 9.2  RBC 4.41  --   --  3.45* 3.82*  HGB 9.9*   < > 9.5* 7.9* 8.7*  HCT 33.2*   < > 28.0* 25.7* 27.0*  MCV 75.3*  --   --  74.5* 70.7*  MCH 22.4*  --   --  22.9* 22.8*  MCHC 29.8*  --   --  30.7 32.2  RDW 22.9*  --   --  22.8* 22.6*  PLT 245  --   --  205 222   < > = values in this interval not displayed.    BNP Recent Labs  Lab 04/23/2021 0616  BNP 2,218.2*     DDimer  Recent Labs  Lab 04/04/21 0359  DDIMER 10.71*     Radiology    DG Chest Port 1 View  Result Date: 04/04/2021 CLINICAL DATA:   Intubation EXAM: PORTABLE CHEST 1 VIEW COMPARISON:  Radiograph 04/25/2021 FINDINGS: *Endotracheal tube tip terminates in the mid trachea, 5 cm from the carina. *Transesophageal temperature probe in the upper thoracic esophagus. *Transesophageal tube tip and side port distal to the GE junction. *Telemetry leads and external support devices overlie the chest. Significantly improved airspace opacity throughout the right lung. No new consolidative process is seen. No visible pneumothorax or effusion. Stable cardiomediastinal contours with a calcified aorta. No acute osseous or soft tissue abnormality. Degenerative changes are present in the imaged spine and shoulders. IMPRESSION: Significantly improved airspace opacities throughout the right hemithorax. Lines and tubes as above. Electronically Signed   By: Lovena Le M.D.   On: 04/04/2021 06:13   EEG adult  Result Date: 04/10/2021 Lora Havens, MD     04/08/2021  3:32 PM Patient Name: [name] MRN: [id number] Epilepsy Attending: Lora Havens Referring Physician/Provider: Dr Jacky Kindle Date: 04/29/2021 Duration: 34.46 mins Patient history: 60yo M s/p cardiac arrest. EEG to evaluate for seizure Level of alertness:  comatose AEDs during EEG study: Propofol Technical aspects: This EEG study was done with scalp electrodes positioned according to the 10-20 International system of electrode placement. Electrical activity was acquired at a sampling rate of 500Hz and reviewed with a high frequency filter of 70Hz and a low frequency filter of 1Hz. EEG data were recorded continuously and digitally stored. Description: EEG showed continuous generalized background suppression. Patient was noted to have episodes of whole body brief jerks. Concomitant eeg showed generalized polyspike and wave consistent with myoclonic seizure, every 2-3 minutes, lasting 30-45 seconds. EEG was not reactive to tactile stimulation. Hyperventilation and photic stimulation were not  performed.   ABNORMALITY - Myoclonic seizure, generalized -  Background suppression, generalized IMPRESSION: This study showed myoclonic seizures, every 2-3 minutes, lasting 30-45 seconds as well as profound diffuse encephalopathy. 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 Dr Tacy Learn was notified Lora Havens   Overnight EEG with video  Result Date: 04/04/2021 Lora Havens, MD     04/05/2021  9:14 AM Patient Name: Vergil Burby MRN: [id number] Epilepsy Attending: Lora Havens Referring Physician/Provider: Dr Jacky Kindle Duration: 04/15/2021 1529 to 04/04/2021 1529  Patient history: 60yo M s/p cardiac arrest. EEG to evaluate for seizure  Level of alertness:  comatose  AEDs during EEG study: Propofol, LEV, VPA  Technical aspects: This EEG study was done with scalp electrodes positioned according to the 10-20 International system of electrode placement. Electrical activity was acquired at a sampling rate of 500Hz and reviewed with a high frequency filter of 70Hz and a low frequency filter of 1Hz. EEG data were recorded continuously and digitally stored. Description: Initially, patient was noted to have episodes of whole body brief jerks. Concomitant eeg showed generalized polyspike and wave consistent with myoclonic seizure, every 2-3 minutes, lasting 30-45 seconds. Gradually, the frequency of seizures increased in the started happening every 30 to 45 seconds. After around 2300 on 04/10/2021 as medications were adjusted, EEG showed generalized background attenuation. After 0230 on 04/04/2021, EEG started worsening again and showed burst suppression pattern with highly epileptiform bursts lasting 10 seconds alternating with EEG suppression lasting about 10 seconds.  Again after around 4 AM, EEG showed continuous generalized background attenuation. After 9 AM, EEG showed generalized periodic epileptiform discharges with overriding fast activity at 0.5-1Hz. ABNORMALITY  - Myoclonic seizure, generalized -Burst suppression with highly epileptiform discharges, generalized  IMPRESSION: This study  initially showed myoclonic seizures, every 2-3 minutes, lasting 30-45 seconds.  Seizures stopped after around 2300 on 04/05/2021.  However, EEG continued to show generalized epileptogenicity as well as profound diffuse encephalopathy. 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  Lora Havens   ECHOCARDIOGRAM COMPLETE  Result Date: 04/08/2021    ECHOCARDIOGRAM REPORT   Patient Name:   SANJIV CASTORENA Date of Exam: 04/22/2021 Medical Rec #:  [id number]      Height:       75.0 in Accession #:    [id number]     Weight:       230.0 lb Date of Birth:  1961/11/27      BSA:          2.329 m Patient Age:    35 years       BP:           191/93 mmHg Patient Gender: M              HR:           96 bpm. Exam Location:  Inpatient Procedure: 2D Echo, Cardiac Doppler and Color Doppler Indications:    Cardiac arrest  History:        Patient has no prior history of Echocardiogram examinations.                 PVD, Arrythmias:Cardiac Arrest and NSVT,                 Signs/Symptoms:Shortness of Breath and ESRD; Risk                 Factors:Diabetes, Hypertension and Dyslipidemia.  Sonographer:    Dustin Flock Referring Phys: [phone] [place]  1. Left ventricular ejection fraction, by estimation, is 50 to 55%. The left ventricle has low normal function. The left ventricle has no regional wall motion abnormalities. There is severe left ventricular hypertrophy. Left ventricular diastolic parameters are consistent with Grade I diastolic dysfunction (impaired relaxation).  2. Right ventricular systolic function is normal. The right ventricular size is normal. There is mildly elevated pulmonary artery systolic pressure.  3. The mitral valve is normal in structure. No evidence of mitral valve regurgitation. No evidence of mitral stenosis.  4. The  aortic valve is calcified. Aortic valve regurgitation is not visualized. Mild to moderate aortic valve sclerosis/calcification is present, without any evidence of aortic stenosis.  5. The inferior vena cava is normal in size with greater than 50% respiratory variability, suggesting right atrial pressure of 3 mmHg. FINDINGS  Left Ventricle: Left ventricular ejection fraction, by estimation, is 50 to 55%. The left ventricle has low normal function. The left ventricle has no regional wall motion abnormalities. The left ventricular internal cavity size was normal in size. There is severe left ventricular hypertrophy. Left ventricular diastolic parameters are consistent with Grade I diastolic dysfunction (impaired relaxation). Right Ventricle: The right ventricular size is normal. No increase in right ventricular wall thickness. Right ventricular systolic function is normal. There is mildly elevated pulmonary artery systolic pressure. The tricuspid regurgitant velocity is 3.07  m/s, and with an assumed right atrial pressure of 3 mmHg, the estimated right ventricular systolic pressure is 42.5 mmHg. Left Atrium: Left atrial size was normal in size. Right Atrium: Right atrial size was normal in size. Pericardium: There is no evidence of pericardial effusion. Mitral Valve: The mitral valve is normal in structure. No evidence of mitral valve regurgitation. No evidence of mitral valve stenosis. Tricuspid Valve: The tricuspid valve is normal in structure. Tricuspid valve regurgitation is trivial. No evidence of tricuspid stenosis. Aortic Valve: The aortic valve is calcified. Aortic valve regurgitation is not visualized. Mild to moderate aortic valve sclerosis/calcification is present, without any evidence of aortic stenosis. Pulmonic Valve: The pulmonic valve was normal in structure. Pulmonic valve regurgitation is not visualized. No evidence of pulmonic stenosis. Aorta: The aortic root is normal in size and structure. Venous:  The inferior vena cava is normal in size with greater than 50% respiratory variability, suggesting right atrial pressure of 3 mmHg. IAS/Shunts: No atrial level shunt detected by color flow Doppler.  LEFT VENTRICLE PLAX 2D LVIDd:         3.80 cm  Diastology LVIDs:         2.70 cm  LV e' medial:    5.11 cm/s LV PW:         1.50 cm  LV E/e' medial:  17.7 LV IVS:        1.70 cm  LV e' lateral:   8.05 cm/s LVOT diam:     2.40 cm  LV E/e' lateral: 11.2 LV SV:         96 LV SV Index:   41 LVOT Area:     4.52 cm  RIGHT VENTRICLE RV Basal diam:  2.80 cm RV S prime:     14.50 cm/s TAPSE (M-mode): 3.1 cm LEFT ATRIUM             Index       RIGHT ATRIUM           Index LA diam:        4.00 cm 1.72 cm/m  RA Area:     16.00  cm LA Vol (A2C):   56.3 ml 24.18 ml/m RA Volume:   44.00 ml  18.89 ml/m LA Vol (A4C):   57.0 ml 24.48 ml/m LA Biplane Vol: 58.3 ml 25.04 ml/m  AORTIC VALVE LVOT Vmax:   128.00 cm/s LVOT Vmean:  78.000 cm/s LVOT VTI:    0.213 m  AORTA Ao Root diam: 3.30 cm MITRAL VALVE               TRICUSPID VALVE MV Area (PHT): 3.53 cm    TR Peak grad:   37.7 mmHg MV Decel Time: 215 msec    TR Vmax:        307.00 cm/s MV E velocity: 90.40 cm/s MV A velocity: 97.30 cm/s  SHUNTS MV E/A ratio:  0.93        Systemic VTI:  0.21 m                            Systemic Diam: 2.40 cm Candee Furbish MD Electronically signed by Candee Furbish MD Signature Date/Time: 05/02/2021/12:28:54 PM    Final     Cardiac Studies   TTE 04/15/2021: IMPRESSIONS  1. Left ventricular ejection fraction, by estimation, is 50 to 55%. The  left ventricle has low normal function. The left ventricle has no regional  wall motion abnormalities. There is severe left ventricular hypertrophy.  Left ventricular diastolic  parameters are consistent with Grade I diastolic dysfunction (impaired  relaxation).  2. Right ventricular systolic function is normal. The right ventricular  size is normal. There is mildly elevated pulmonary artery systolic   pressure.  3. The mitral valve is normal in structure. No evidence of mitral valve  regurgitation. No evidence of mitral stenosis.  4. The aortic valve is calcified. Aortic valve regurgitation is not  visualized. Mild to moderate aortic valve sclerosis/calcification is  present, without any evidence of aortic stenosis.  5. The inferior vena cava is normal in size with greater than 50%  respiratory variability, suggesting right atrial pressure of 3 mmHg.   Patient Profile     60 y.o. 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  Assessment & Plan    #Cardiac arrest #VT Patient presented with respiratory failure complicated by cardiac arrest with initial rhythm reported as asystole (?) requiring ~128m of CPR. Given epi and lido with ROSC. On arrival to ER, he was in brief episode of VT that converted (do not believe shock was administered) No known hisotry of CAD but has several risk factors. TTE with normal EF, no significant WMA -Maintaining normothermia -Off amiodarone gtt due to bradycardia; no significant ectopy on monitor -TTE with normal LVEF, no significant WMA -Suspect overall poor prognosis; if meaningful recovery, can pursue ischemic work-up at that time  #Aspiration PNA: Likely a complication of respiratory failure in the field s/p intubation. -Continue ABX per primary team  #Myoclonic Seizures: Multiple myoclonic seizures overnight on EEG. Started on keppra, versed and valproate. Remains on propofol. -Management per neurology -Suspected overall poor prognosis  #Prolonged QTc: Likely a result of multiple Qtc prolonging  medications. No VT on monitor. Will need to watch closely. -Monitor  closely for recurrence of VT -Off amiodarone gtt; no significant ectopy -Keep K>4, Mg>2 -Wean seizure medications/sedation as tolerated  #HTN: Previously on nicardipine gtt; weaned off due to hypotension with sedation. On amlodipine 19m daily, carvedilol 12.556mBID (once daily on HD days), and hydralazine 10076mID at home. -Holding home antihypertensives--add back as needed given improvement in BPs  #PVD: Extensive amputation history including bilateral BKA, amputation of fingers on right hand, and amputation of thumb/5th fingers on left hand. He is on plavix outpatient.  - Resume plavix and statin when able   #HLD: -Resume statin as above  #ESRD on HD: Missed HD on 04/01/21, however, electrolytes fairly stable on admission and likely not the primary driver of arrest. -Management per renal  CRITICAL CARE TIME: I have spent a total of 35 minutes with patient reviewing hospital notes, telemetry, EKGs, labs and examining the patient as well as establishing an assessment and plan that was discussed with the patient.  > 50% of time was spent in direct patient care. The patient is critically ill with multi-organ system failure and requires high complexity decision making for assessment and support, frequent evaluation and titration of therapies, application of advanced monitoring technologies and extensive interpretation of multiple databases.      For questions or updates, please contact [email or web address] consult [email or web address] for contact info under        Signed, HeaFreada BergeronD  04/05/2021, 9:25 AM

## 2021-04-05 ENCOUNTER — Inpatient Hospital Stay (HOSPITAL_COMMUNITY): Payer: Medicare Other

## 2021-04-05 DIAGNOSIS — J9601 Acute respiratory failure with hypoxia: Secondary | ICD-10-CM | POA: Diagnosis not present

## 2021-04-05 DIAGNOSIS — J9602 Acute respiratory failure with hypercapnia: Secondary | ICD-10-CM | POA: Diagnosis not present

## 2021-04-05 LAB — CBC
HCT: 27 % — ABNORMAL LOW (ref 39.0–52.0)
Hemoglobin: 8.7 g/dL — ABNORMAL LOW (ref 13.0–17.0)
MCH: 22.8 pg — ABNORMAL LOW (ref 26.0–34.0)
MCHC: 32.2 g/dL (ref 30.0–36.0)
MCV: 70.7 fL — ABNORMAL LOW (ref 80.0–100.0)
Platelets: 222 10*3/uL (ref 150–400)
RBC: 3.82 MIL/uL — ABNORMAL LOW (ref 4.22–5.81)
RDW: 22.6 % — ABNORMAL HIGH (ref 11.5–15.5)
WBC: 9.2 10*3/uL (ref 4.0–10.5)
nRBC: 0.2 % (ref 0.0–0.2)

## 2021-04-05 LAB — BASIC METABOLIC PANEL
Anion gap: 15 (ref 5–15)
BUN: 32 mg/dL — ABNORMAL HIGH (ref 8–23)
CO2: 24 mmol/L (ref 22–32)
Calcium: 7.7 mg/dL — ABNORMAL LOW (ref 8.9–10.3)
Chloride: 95 mmol/L — ABNORMAL LOW (ref 98–111)
Creatinine, Ser: 4.59 mg/dL — ABNORMAL HIGH (ref 0.61–1.24)
GFR, Estimated: 14 mL/min — ABNORMAL LOW (ref >60)
Glucose, Bld: 81 mg/dL (ref 70–99)
Potassium: 3.6 mmol/L (ref 3.5–5.1)
Sodium: 134 mmol/L — ABNORMAL LOW (ref 135–145)

## 2021-04-05 LAB — GLUCOSE, CAPILLARY
Glucose-Capillary: 114 mg/dL — ABNORMAL HIGH (ref 70–99)
Glucose-Capillary: 79 mg/dL (ref 70–99)
Glucose-Capillary: 80 mg/dL (ref 70–99)
Glucose-Capillary: 83 mg/dL (ref 70–99)
Glucose-Capillary: 89 mg/dL (ref 70–99)
Glucose-Capillary: 91 mg/dL (ref 70–99)
Glucose-Capillary: 97 mg/dL (ref 70–99)

## 2021-04-05 LAB — PHOSPHORUS: Phosphorus: 4.1 mg/dL (ref 2.5–4.6)

## 2021-04-05 LAB — MAGNESIUM: Magnesium: 2.1 mg/dL (ref 1.7–2.4)

## 2021-04-05 MED ORDER — HYDRALAZINE HCL 20 MG/ML IJ SOLN
10.0000 mg | INTRAMUSCULAR | Status: DC | PRN
Start: 2021-04-05 — End: 2021-04-06
  Administered 2021-04-05 (×2): 10 mg via INTRAVENOUS
  Administered 2021-04-05: 20 mg via INTRAVENOUS
  Filled 2021-04-05 (×3): qty 1

## 2021-04-05 MED ORDER — HYDRALAZINE HCL 10 MG PO TABS
10.0000 mg | ORAL_TABLET | Freq: Four times a day (QID) | ORAL | Status: DC | PRN
  Administered 2021-04-05: 10 mg
  Filled 2021-04-05: qty 1

## 2021-04-05 MED ORDER — HYDRALAZINE HCL 10 MG PO TABS
10.0000 mg | ORAL_TABLET | Freq: Four times a day (QID) | ORAL | Status: DC | PRN
  Filled 2021-04-05: qty 1

## 2021-04-05 NOTE — Progress Notes (Signed)
NAMESylas [name], MRN:  [id number], DOB:  03/17/1961, LOS: 2 ADMISSION DATE:  05/02/2021, CONSULTATION DATE: 04/07/2021 REFERRING MD:  Veryl Speak, CHIEF COMPLAINT: Shortness of breath, respiratory arrest  History of Present Illness:  60 year old man with end-stage renal disease on dialysis, arterial disease status post bilateral BKA and multiple amputation of bilateral hands, was brought into the emergency department after he started feeling short of breath. Upon EMS note patient missed his hemodialysis on Friday, started with increasing shortness of breath so he called 911, in route he started having worsening hypoxia leading to respiratory arrest and asystole, CPR was initiated, [name]'s airway was placed, he was brought in the emergency department CPR was in progress.  Patient was intubated on arrival to ED, ROSC achieved in about 15 minutes, post ROSC patient was noted to have V. Tach Initial work-up showed lactic acidosis and bilateral pulmonary infiltrates consistent with aspiration pneumonia.  Head CT was done which showed no acute intracranial abnormalities.  PCCM consulted for admission  Pertinent  Medical History   Past Medical History:  Diagnosis Date  . DM type 2 (diabetes mellitus, type 2) ([place])   . ESRD (end stage renal disease) on dialysis ([place])   . HLD (hyperlipidemia)   . HTN (hypertension)   . PVD (peripheral vascular disease) ([place])     Significant Hospital Events: Including procedures, antibiotic start and stop dates in addition to other pertinent events   . 5/1 ETT . 5/2 evidence of myoclonus on EEG, on VPA with ongoing video EEG  Interim History / Subjective:  No acute issues overnight. Video EEG ongoing.   Objective   Blood pressure (!) 165/80, pulse (!) 52, temperature (!) 97.2 F (36.2 C), resp. rate 16, height '6\' 3"'$  (1.905 m), weight 84.8 kg, SpO2 100 %.    Vent Mode: PRVC FiO2 (%):  [40 %] 40 % Set Rate:  [16 bmp] 16 bmp Vt Set:  [580 mL] 580  mL PEEP:  [5 cmH20] 5 cmH20 Plateau Pressure:  [16 cmH20-18 cmH20] 16 cmH20   Intake/Output Summary (Last 24 hours) at 04/05/2021 0753 Last data filed at 04/05/2021 0400 Gross per 24 hour  Intake 2580.87 ml  Output 3166 ml  Net -585.13 ml   Filed Weights   04/28/2021 0637 04/04/21 0500 04/05/21 0600  Weight: 104.3 kg 86.4 kg 84.8 kg    Exam General:  Middle aged male of normal body habitus on vent Neuro:  Unresponsive, sedated. RASS -4. L hand twitching. Upward gaze preference.  HEENT:  [place]/AT, No JVD noted, PERRL Cardiovascular:  Loletha Grayer, regular, no MRG Lungs:  Clear bilateral breath sounds.  Abdomen:  Soft, non-distended Musculoskeletal:  Bilateral lower and upper extremity amputations.  Skin:  Multiple small dry wounds on lower extremities.    Labs/imaging that I havepersonally reviewed  (right click and "Reselect all SmartList Selections" daily)   WBC 9.2 Hgb 8.7 5/2 blood cultures x2>> pending  Resolved Hospital Problem list     Assessment & Plan:    Acute respiratory arrest leading to PEA cardiac arrest with subsequent NSVT, aspiration pneumonia Wife reports patient had missed dialysis and was not taking his home Keppra, suspect he seized and had an aspiration event leading to hypoxic respiratory failure.  Echo showed EF 50 to 55% with grade 1 diastolic failure  P: -TTM protocol -Will need cards eval and LHC if he is able to make a recovery.  -Continue Unasyn, follow respiratory culture and blood cultures. -Full vent support -Bronchial hygiene and RT/bronchodilator  protocol.  Acute anoxic encaphalopathy Seizure disorder  EEG initially with myoclonic seizures every 2 to 3 minutes, ceased on 5 1 however EEG continues to show generalized epileptogenicity in the city and profound diffuse encephalopathy. Initial head CT negative P: -Appreciate neurology recommendations, started on Keppra, Versed valproate and on 100 mcg propofol -Propofol for RASS goal -1 to -2 and  burst suppression per neurology.  -Intermittently assess neuro exam, with goal for neuroprognostication at the 96 hour mark.   Sinus bradycardia QTC prolongation (622) - DC amiodarone  - Tele monitroing  ESRD -Appreciate nephrology consult -Last HD 5/2 -Follow BMP  Hypertension: Initially in hypertensive emergency and started on Cardene, blood pressure predictably dropped after high-dose propofol initiated P: -Hold home Norvasc and hydralazine -PRN hydralazine to keep SBP < 113mHg  Type 2 diabetes melliuts Glucose 80s, home Lantus held P: -Continue SSI - TF started 5/3    Best practice (right click and "Reselect all SmartList Selections" daily)  Diet:  Tube Feed  Pain/Anxiety/Delirium protocol (if indicated): Yes (RASS goal -1,-2) VAP protocol (if indicated): Yes DVT prophylaxis: Subcutaneous Heparin GI prophylaxis: PPI Glucose control:  SSI Yes Central venous access:  N/A Arterial line:  N/A Foley:  N/A Mobility:  bed rest  PT consulted: N/A Last date of multidisciplinary goals of care discussion [pending]: Wife updated with current status 5/2 Code Status:  full code Disposition: PCU    Critical care time: 37 minutes     PGeorgann Housekeeper AGACNP-BC LDamascusfor personal pager PCCM on call pager ([phone] 7pm. Please call Elink 7p-7a. [phone] 04/05/2021 8:11 AM

## 2021-04-05 NOTE — Progress Notes (Signed)
Subjective: No acute events overnight.  Patient's cousin at bedside.  ROS: Unable to obtain due to poor mental status  Examination  Vital signs in last 24 hours: Temp:  [96.08 F (35.6 C)-98.24 F (36.8 C)] 97.88 F (36.6 C) (05/03 1200) Pulse Rate:  [51-58] 58 (05/03 1200) Resp:  [0-17] 16 (05/03 1200) BP: (122-170)/(38-81) 167/74 (05/03 1200) SpO2:  [100 %] 100 % (05/03 1200) FiO2 (%):  [40 %] 40 % (05/03 1147) Weight:  [84.6 kg-84.8 kg] 84.6 kg (05/03 0800)  General: lying in bed, not in apparent distress CVS: pulse-normal rate and rhythm RS: Intubated, coarse breath sounds bilateral Extremities: Bilateral BKA, right hand amputation, significant peripheral arterial disease and left hand fingers Neuro: Comatose, does not open eyes to noxious stimuli, does not follow commands, pupils about 3 mm and not reactive to light, corneal reflex absent, gag reflex absent, does not withdraw to noxious stimuli in any extremity  Basic Metabolic Panel: Recent Labs  Lab 04/17/2021 0615 04/25/2021 0619 04/05/2021 0655 04/09/2021 1058 04/19/2021 2011 04/29/2021 2018 04/10/2021 2143 04/04/21 0410 04/04/21 0532 04/05/21 0015  NA 141 139   < > 139 139 139 140  --  138 134*  K 4.2 3.9   < > 3.7 3.8 4.7 3.9  --  4.4 3.6  CL 97* 103  --  98  --  98  --   --  98 95*  CO2 21*  --   --  19*  --  18*  --   --  15* 24  GLUCOSE 240* 226*  --  205*  --  83  --   --  89 81  BUN 55* 65*  --  60*  --  70*  --   --  73* 32*  CREATININE 9.45* 9.70*  --  9.61*  --  9.79*  --   --  10.15* 4.59*  CALCIUM 10.3  --   --  8.1*  --  7.8*  --   --  7.2* 7.7*  MG 2.8*  --   --   --   --   --   --  2.2  --  2.1  PHOS  --   --   --   --   --   --   --  8.4*  --  4.1   < > = values in this interval not displayed.    CBC: Recent Labs  Lab 04/30/2021 0615 04/24/2021 0619 04/28/2021 1058 04/20/2021 2011 04/30/2021 2143 04/04/21 0410 04/05/21 0015  WBC 13.6*  --  11.8*  --   --  14.7* 9.2  NEUTROABS 7.0  --   --   --   --   --    --   HGB 9.5*   < > 9.9* 10.2* 9.5* 7.9* 8.7*  HCT 34.3*   < > 33.2* 30.0* 28.0* 25.7* 27.0*  MCV 81.9  --  75.3*  --   --  74.5* 70.7*  PLT 244  --  245  --   --  205 222   < > = values in this interval not displayed.     Coagulation Studies: Recent Labs    04/14/2021 1058  LABPROT 15.2  INR 1.2    Imaging No new brain imaging overnight  ASSESSMENT AND PLAN: 60 year old male status post cardiorespiratory arrest.  Cardiorespiratory arrest Anoxic/hypoxic brain injury - LTM EEG shows generalized epileptogenicity as well as profound diffuse encephalopathy. In setting of cardiac arrest, this is most likely suggestive  of anoxic-hypoxic injury.   Recommendations -We will start weaning propofol to stop.  If seizures recur, it is unlikely that attempting to suppress seizures again will change outcome - Continue Keppra 500 mg twice daily, Depakote 250 mg every 8 hours -Patient has multiple comorbidities, had prolonged downtime of about 15 minutes, was having frequent myoclonic seizures on arrival which were very difficult to control, continues to have generalized periodic epileptiform discharges.  I suspect patient has sustained severe anoxic/hypoxic brain injury with minimal to no chances of meaningful neurologic recovery. -Plan for MRI brain without contrast at 72 hours -Management of rest of comorbidities per primary team  CRITICAL CARE Performed by: Lora Havens   Total critical care time: 40 minutes  Critical care time was exclusive of separately billable procedures and treating other patients.  Critical care was necessary to treat or prevent imminent or life-threatening deterioration.  Critical care was time spent personally by me on the following activities: development of treatment plan with patient and/or surrogate as well as nursing, discussions with consultants, evaluation of patient's response to treatment, examination of patient, obtaining history from patient  or surrogate, ordering and performing treatments and interventions, ordering and review of laboratory studies, ordering and review of radiographic studies, pulse oximetry and re-evaluation of patient's condition.  Zeb Comfort Epilepsy [facility] For questions after 5pm please refer to AMION to reach the Neurologist on call

## 2021-04-05 NOTE — Plan of Care (Signed)

## 2021-04-05 NOTE — Progress Notes (Signed)
Progress Note  Patient Name: [name] Date of Encounter: 04/06/2021  [place] Cardiologist: None   Subjective   Weaned off propofol. Unresponsive on exam this AM. Planned for MRI today per Neuro.  Inpatient Medications    Scheduled Meds: . amLODipine  10 mg Per Tube Daily  . B-complex with vitamin C  1 tablet Per Tube Daily  . carvedilol  12.5 mg Per Tube BID  . chlorhexidine gluconate (MEDLINE KIT)  15 mL Mouth Rinse BID  . Chlorhexidine Gluconate Cloth  6 each Topical Q0600  . docusate  100 mg Per Tube BID  . feeding supplement (PROSource TF)  45 mL Per Tube Daily  . feeding supplement (VITAL HIGH PROTEIN)  1,000 mL Per Tube Q24H  . heparin  5,000 Units Subcutaneous Q8H  . insulin aspart  0-9 Units Subcutaneous Q4H  . mouth rinse  15 mL Mouth Rinse 10 times per day  . pantoprazole (PROTONIX) IV  40 mg Intravenous QHS  . polyethylene glycol  17 g Per Tube Daily   Continuous Infusions: . ampicillin-sulbactam (UNASYN) IV 200 mL/hr at 04/06/21 0800  . calcium gluconate    . lactated ringers Stopped (04/06/21 0739)  . levETIRAcetam Stopped (04/05/21 1712)  . valproate sodium Stopped (04/06/21 0715)   PRN Meds: acetaminophen, artificial tears, docusate, fentaNYL (SUBLIMAZE) injection, hydrALAZINE, ondansetron (ZOFRAN) IV, polyethylene glycol   Vital Signs    Vitals:   04/06/21 0630 04/06/21 0700 04/06/21 0720 04/06/21 0800  BP: (!) 163/77 (!) 171/83 (!) 171/83 (!) 176/83  Pulse: 65 66 66 67  Resp: 17 20 (!) 22 12  Temp: (!) 96.08 F (35.6 C) (!) 96.26 F (35.7 C) (!) 96.26 F (35.7 C) (!) 96.26 F (35.7 C)  TempSrc:    Esophageal  SpO2: 100% 100% 100% 100%  Weight:      Height:        Intake/Output Summary (Last 24 hours) at 04/06/2021 0917 Last data filed at 04/06/2021 0800 Gross per 24 hour  Intake 2029.11 ml  Output 190 ml  Net 1839.11 ml   Last 3 Weights 04/06/2021 04/05/2021 04/05/2021  Weight (lbs) 181 lb 14.1 oz 186 lb 8.2 oz 186 lb 15.2 oz   Weight (kg) 82.5 kg 84.6 kg 84.8 kg      Telemetry    NSR with occasional PVCs - Personally Reviewed  ECG    No new tracings - Personally Reviewed  Physical Exam   GEN: Intubated, not responsive despite being off propofol Neck: No JVD Cardiac: RRR, no murmurs, rubs, or gallops.  Respiratory: Coarse vent sounds GI: Soft, nontender, non-distended  MS: Bilateral BKA Neuro:  Unresponsive Psych: Unable to assess  Labs    High Sensitivity Troponin:   Recent Labs  Lab 05/01/2021 0615 04/16/2021 1058  TROPONINIHS 47* 253*      Chemistry Recent Labs  Lab 04/08/2021 0615 04/23/2021 0619 04/04/21 0532 04/05/21 0015 04/06/21 0032  NA 141   < > 138 134* 132*  K 4.2   < > 4.4 3.6 5.0  CL 97*   < > 98 95* 95*  CO2 21*   < > 15* 24 21*  GLUCOSE 240*   < > 89 81 95  BUN 55*   < > 73* 32* 60*  CREATININE 9.45*   < > 10.15* 4.59* 7.22*  CALCIUM 10.3   < > 7.2* 7.7* 7.0*  PROT 6.4*  --   --   --   --   ALBUMIN 2.4*  --   --   --   --  AST 23  --   --   --   --   ALT 9  --   --   --   --   ALKPHOS 54  --   --   --   --   BILITOT 0.8  --   --   --   --   GFRNONAA 6*   < > 5* 14* 8*  ANIONGAP 23*   < > 25* 15 16*   < > = values in this interval not displayed.     Hematology Recent Labs  Lab 04/04/21 0410 04/05/21 0015 04/06/21 0032  WBC 14.7* 9.2 7.3  RBC 3.45* 3.82* 3.87*  HGB 7.9* 8.7* 8.8*  HCT 25.7* 27.0* 27.9*  MCV 74.5* 70.7* 72.1*  MCH 22.9* 22.8* 22.7*  MCHC 30.7 32.2 31.5  RDW 22.8* 22.6* 22.9*  PLT 205 222 183    BNP Recent Labs  Lab 04/25/2021 0616  BNP 2,218.2*     DDimer  Recent Labs  Lab 04/04/21 0359  DDIMER 10.71*     Radiology    DG Abd Portable 1V  Result Date: 04/05/2021 CLINICAL DATA:  Complication of feeding tube EXAM: PORTABLE ABDOMEN - 1 VIEW COMPARISON:  None. FINDINGS: NG tube in the lateral body of the stomach. No bowel obstruction. Mild gas in the colon may represent mild ileus. No urinary tract calculi. No acute skeletal  abnormality. IMPRESSION: NG tube in the body the stomach. Mildly distended colon. Electronically Signed   By: Franchot Gallo M.D.   On: 04/05/2021 20:11   Overnight EEG with video  Result Date: 04/04/2021 Lora Havens, MD     04/05/2021  9:14 AM Patient Name: [name] MRN: [id number] Epilepsy Attending: Lora Havens Referring Physician/Provider: Dr Jacky Kindle Duration: 04/30/2021 1529 to 04/04/2021 1529  Patient history: 60yo M s/p cardiac arrest. EEG to evaluate for seizure  Level of alertness:  comatose  AEDs during EEG study: Propofol, LEV, VPA  Technical aspects: This EEG study was done with scalp electrodes positioned according to the 10-20 International system of electrode placement. Electrical activity was acquired at a sampling rate of '500Hz'  and reviewed with a high frequency filter of '70Hz'  and a low frequency filter of '1Hz' . EEG data were recorded continuously and digitally stored. Description: Initially, patient was noted to have episodes of whole body brief jerks. Concomitant eeg showed generalized polyspike and wave consistent with myoclonic seizure, every 2-3 minutes, lasting 30-45 seconds. Gradually, the frequency of seizures increased in the started happening every 30 to 45 seconds. After around 2300 on 04/20/2021 as medications were adjusted, EEG showed generalized background attenuation. After 0230 on 04/04/2021, EEG started worsening again and showed burst suppression pattern with highly epileptiform bursts lasting 10 seconds alternating with EEG suppression lasting about 10 seconds.  Again after around 4 AM, EEG showed continuous generalized background attenuation. After 9 AM, EEG showed generalized periodic epileptiform discharges with overriding fast activity at 0.5-'1Hz' . ABNORMALITY - Myoclonic seizure, generalized -Burst suppression with highly epileptiform discharges, generalized  IMPRESSION: This study  initially showed myoclonic seizures, every 2-3 minutes, lasting 30-45  seconds.  Seizures stopped after around 2300 on 04/09/2021.  However, EEG continued to show generalized epileptogenicity as well as profound diffuse encephalopathy. 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  Lora Havens    Cardiac Studies   TTE 04/08/2021: IMPRESSIONS 1. Left ventricular ejection fraction, by estimation, is 50 to 55%. The  left ventricle has low  normal function. The left ventricle has no regional  wall motion abnormalities. There is severe left ventricular hypertrophy.  Left ventricular diastolic  parameters are consistent with Grade I diastolic dysfunction (impaired  relaxation).  2. Right ventricular systolic function is normal. The right ventricular  size is normal. There is mildly elevated pulmonary artery systolic  pressure.  3. The mitral valve is normal in structure. No evidence of mitral valve  regurgitation. No evidence of mitral stenosis.  4. The aortic valve is calcified. Aortic valve regurgitation is not  visualized. Mild to moderate aortic valve sclerosis/calcification is  present, without any evidence of aortic stenosis.  5. The inferior vena cava is normal in size with greater than 50%  respiratory variability, suggesting right atrial pressure of 3 mmHg.    Patient Profile     60 y.o. 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  Assessment & Plan    #Cardiac arrest #VT Patient presented with respiratory failure complicated by cardiac arrest withinitial rhythm reported as asystole (?)requiring ~139m of CPR. Givenepi and lido with ROSC. On arrival to ER, he was  in brief episode ofVTthat converted (do not believe shock was administered)No known hisotry of CAD but has several risk factors. TTE with normal EF, no significant WMA -MRI brain today  -Likely poor prognosis; unless meaningful recovery which is unlikely, no plans for further ischemic work-up at this time  #Aspiration PNA: Likely a complication of respiratory failure in the field s/p intubation. 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 On keppra and valproate. Propofol off currently -Management per neurology -MRI today as above  #Prolonged QTc: Improved. No significant ectopy on monitor -Monitor closely for recurrence of VT -Off amiodarone gtt; no significant ectopy -Keep K>4, Mg>2 -Wean seizure medications/sedation as tolerated  #HTN: Previously on nicardipine gtt; weaned off due tohypotension with sedation.Blood pressures now elevated off sedation. Onamlodipine 1010maily, carvedilol 12.5mg35mD (once daily on HD days), and hydralazine 100mg37m at home. -Continue amlodipine 10mg 51my -Continue coreg 12.5mg BI56mResume hydralazine as needed if remains hypertensive  #PVD: Extensive amputation history including bilateral BKA, amputation of fingers on right hand, and amputation of thumb/5th fingers on left hand. He is on plavix outpatient.  -Awaiting prognostication as above -If suspected recovery, resume plavix and statin  #HLD: -Statin held; if meaningful recovery, can resume  #ESRD on HD: Missed HD on 04/01/21, however, electrolytes fairly stable on admission and likely not the primary driver of arrest. -Management per renal      For questions or updates, please contact [facility] consult [email or web address] for contact info under        Signed, HeatherFreada Bergeron/03/2021, 9:17 AM

## 2021-04-05 NOTE — Progress Notes (Signed)
[facility] Progress Note Patient Name: [name] DOB: [birth year] MRN: [id number]   Date of Service  04/05/2021  HPI/Events of Note  Notified of SBP 160s, HR 50s  eICU Interventions  Ordered prn hydralazine     Intervention Category Major Interventions: Hypertension - evaluation and management  Shona Needles Bain Whichard 04/05/2021, 5:06 AM

## 2021-04-05 NOTE — Procedures (Addendum)
Patient Name:[name] TQ:[id number] Epilepsy Attending:Justiss Gerbino Barbra Sarks Referring Physician/Provider:Dr Jacky Kindle Duration:04/04/2021 1529 to 04/05/2021 1529  Patient history:60yo M s/p cardiac arrest. EEG to evaluate for seizure  Level of alertness:comatose  AEDs during EEG study:Propofol, LEV, VPA  Technical aspects: This EEG study was done with scalp electrodes positioned according to the 10-20 International system of electrode placement. Electrical activity was acquired at a sampling rate of '500Hz'$  and reviewed with a high frequency filter of '70Hz'$  and a low frequency filter of '1Hz'$ . EEG data were recorded continuously and digitally stored.   Description: EEG showed generalized periodic epileptiform discharges with overriding fast activity at 1-1.'5Hz'$ .  After 2100 on 04/04/2021, EEG also showed predominantly generalized background attenuation.  ABNORMALITY -Periodic epileptiform discharges with overriding fast activity, generalized -Background attenuation, generalized  IMPRESSION: This studyshowed evidence of generalized epileptogenicity as well as profound diffuse encephalopathy. 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   Teresea Donley Barbra Sarks

## 2021-04-05 NOTE — Progress Notes (Signed)
EEG maint complete. No skin breakdown at F7 T3 C3 continue to monitor

## 2021-04-05 NOTE — Progress Notes (Signed)
[facility] Progress Note  Subjective: seen in ICU, on vent, propofol to be weaned off today.   Vitals:   04/05/21 1400 04/05/21 1411 04/05/21 1430 04/05/21 1514  BP: (!) 180/78 (!) 180/78 (!) 149/62   Pulse: 61   63  Resp: 16   16  Temp: 98.42 F (36.9 C)     TempSrc:      SpO2: 100%   100%  Weight:      Height:        Exam:  on vent , not responsive  no jvd  throat ett in place  Chest cta bilat and lat  Cor reg no RG  Abd soft ntnd no ascites   Ext no LE edema   Bilat BKA and multiple finger amps R > L   Neuro on vent and sedated, not following commands   LUE AVF+bruit     OP HD: GO MWF   4h 36mn  425/500  73.5kg  2/2.5 bath  AVF 15g  Hep none  - mircera 150 q2 last 4/27, due 5/11  - venofer 50 q wk  - calcitriol 2.5 ug po tiw  - renvela 4 ac tid    5/02 CXR - patchy basilar infiltrates, mostly clear, no edema  Assessment/ Plan: 1. Cardiac arrest: EMS called for respiratory distress, patient deteriorated and eventually required king airway and CPR. Weaning off sedation today and will follow neuro status per CCM.  2. Resp failure: early CXR with large R infiltrate concerning for aspiration.  On IV antibiotics per primary team. F/U CXR 5/02 shows sig clearing.  3.  ESRD:  Missed HD 4/29. Labs good. Had HD yest in ICU. HD tomorrow.  4.  Hypertension/volume: BP is stable now.  Started on IV amiodarone for v. Fib. Cardene gtt off now. 3 L UF w/ HD yest. Same goal tomorrow. 5.  Anemia: Hgb at goal. Not due for ESA dose 6.  Metabolic bone disease: Calcium elevated. NPO, calcitriol and renvela on hold.  7.  Nutrition:  NPO 8. PVD: Patient with history of severe PVD and multiple amputations.   Rob Emelina Hinch 04/05/2021, 3:19 PM   Recent Labs  Lab 04/04/21 0410 04/04/21 0532 04/05/21 0015  K  --  4.4 3.6  BUN  --  73* 32*  CREATININE  --  10.15* 4.59*  CALCIUM  --  7.2* 7.7*  PHOS 8.4*  --  4.1  HGB 7.9*  --  8.7*   Inpatient medications: .  B-complex with vitamin C  1 tablet Per Tube Daily  . chlorhexidine gluconate (MEDLINE KIT)  15 mL Mouth Rinse BID  . Chlorhexidine Gluconate Cloth  6 each Topical Q0600  . docusate  100 mg Per Tube BID  . feeding supplement (PROSource TF)  45 mL Per Tube Daily  . feeding supplement (VITAL HIGH PROTEIN)  1,000 mL Per Tube Q24H  . heparin  5,000 Units Subcutaneous Q8H  . insulin aspart  0-9 Units Subcutaneous Q4H  . mouth rinse  15 mL Mouth Rinse 10 times per day  . pantoprazole (PROTONIX) IV  40 mg Intravenous QHS  . polyethylene glycol  17 g Per Tube Daily   . ampicillin-sulbactam (UNASYN) IV Stopped (04/05/21 0816)  . lactated ringers 10 mL/hr at 04/05/21 1400  . levETIRAcetam Stopped (04/04/21 1702)  . propofol (DIPRIVAN) infusion Stopped (04/05/21 1348)  . valproate sodium 250 mg (04/05/21 1413)   acetaminophen, artificial tears, docusate, fentaNYL (SUBLIMAZE) injection, hydrALAZINE, ondansetron (ZOFRAN) IV, polyethylene glycol

## 2021-04-06 ENCOUNTER — Inpatient Hospital Stay (HOSPITAL_COMMUNITY): Payer: Medicare Other

## 2021-04-06 DIAGNOSIS — R609 Edema, unspecified: Secondary | ICD-10-CM

## 2021-04-06 DIAGNOSIS — J9602 Acute respiratory failure with hypercapnia: Secondary | ICD-10-CM | POA: Diagnosis not present

## 2021-04-06 DIAGNOSIS — E440 Moderate protein-calorie malnutrition: Secondary | ICD-10-CM | POA: Insufficient documentation

## 2021-04-06 DIAGNOSIS — J9601 Acute respiratory failure with hypoxia: Secondary | ICD-10-CM | POA: Diagnosis not present

## 2021-04-06 DIAGNOSIS — I739 Peripheral vascular disease, unspecified: Secondary | ICD-10-CM

## 2021-04-06 DIAGNOSIS — I10 Essential (primary) hypertension: Secondary | ICD-10-CM

## 2021-04-06 LAB — MAGNESIUM: Magnesium: 2.3 mg/dL (ref 1.7–2.4)

## 2021-04-06 LAB — PHOSPHORUS: Phosphorus: 7.3 mg/dL — ABNORMAL HIGH (ref 2.5–4.6)

## 2021-04-06 LAB — BASIC METABOLIC PANEL
Anion gap: 16 — ABNORMAL HIGH (ref 5–15)
BUN: 60 mg/dL — ABNORMAL HIGH (ref 8–23)
CO2: 21 mmol/L — ABNORMAL LOW (ref 22–32)
Calcium: 7 mg/dL — ABNORMAL LOW (ref 8.9–10.3)
Chloride: 95 mmol/L — ABNORMAL LOW (ref 98–111)
Creatinine, Ser: 7.22 mg/dL — ABNORMAL HIGH (ref 0.61–1.24)
GFR, Estimated: 8 mL/min — ABNORMAL LOW (ref >60)
Glucose, Bld: 95 mg/dL (ref 70–99)
Potassium: 5 mmol/L (ref 3.5–5.1)
Sodium: 132 mmol/L — ABNORMAL LOW (ref 135–145)

## 2021-04-06 LAB — CBC
HCT: 27.9 % — ABNORMAL LOW (ref 39.0–52.0)
Hemoglobin: 8.8 g/dL — ABNORMAL LOW (ref 13.0–17.0)
MCH: 22.7 pg — ABNORMAL LOW (ref 26.0–34.0)
MCHC: 31.5 g/dL (ref 30.0–36.0)
MCV: 72.1 fL — ABNORMAL LOW (ref 80.0–100.0)
Platelets: 183 10*3/uL (ref 150–400)
RBC: 3.87 MIL/uL — ABNORMAL LOW (ref 4.22–5.81)
RDW: 22.9 % — ABNORMAL HIGH (ref 11.5–15.5)
WBC: 7.3 10*3/uL (ref 4.0–10.5)
nRBC: 0.3 % — ABNORMAL HIGH (ref 0.0–0.2)

## 2021-04-06 LAB — GLUCOSE, CAPILLARY
Glucose-Capillary: 91 mg/dL (ref 70–99)
Glucose-Capillary: 103 mg/dL — ABNORMAL HIGH (ref 70–99)

## 2021-04-06 MED ORDER — APIXABAN 5 MG PO TABS: 10.0000 mg | ORAL_TABLET | Freq: Two times a day (BID) | ORAL | Status: DC

## 2021-04-06 MED ORDER — HYDRALAZINE HCL 25 MG PO TABS: 25.0000 mg | ORAL_TABLET | Freq: Three times a day (TID) | ORAL | Status: DC

## 2021-04-06 MED ORDER — CARVEDILOL 12.5 MG PO TABS: 12.5000 mg | ORAL_TABLET | Freq: Two times a day (BID) | ORAL | Status: DC

## 2021-04-06 MED ORDER — VITAL HIGH PROTEIN PO LIQD
1000.0000 mL | ORAL | Status: AC
  Administered 2021-04-06: 1000 mL

## 2021-04-06 MED ORDER — VITAL 1.5 CAL PO LIQD
1000.0000 mL | ORAL | Status: DC
  Administered 2021-04-06 – 2021-04-10 (×5): 1000 mL
  Filled 2021-04-06 (×4): qty 1000

## 2021-04-06 MED ORDER — AMLODIPINE BESYLATE 10 MG PO TABS: 10.0000 mg | ORAL_TABLET | Freq: Every day | ORAL | Status: DC

## 2021-04-06 MED ORDER — HYDRALAZINE HCL 20 MG/ML IJ SOLN
10.0000 mg | INTRAMUSCULAR | Status: AC | PRN
  Administered 2021-04-06: 20 mg via INTRAVENOUS
  Filled 2021-04-06: qty 1

## 2021-04-06 MED ORDER — APIXABAN 5 MG PO TABS: 5.0000 mg | ORAL_TABLET | Freq: Two times a day (BID) | ORAL | Status: DC

## 2021-04-06 MED ORDER — CARVEDILOL 12.5 MG PO TABS
12.5000 mg | ORAL_TABLET | Freq: Two times a day (BID) | ORAL | Status: DC
  Administered 2021-04-07: 12.5 mg
  Filled 2021-04-06: qty 1

## 2021-04-06 MED ORDER — AMLODIPINE BESYLATE 10 MG PO TABS
10.0000 mg | ORAL_TABLET | Freq: Every day | ORAL | Status: DC
  Administered 2021-04-07 – 2021-04-11 (×5): 10 mg
  Filled 2021-04-06 (×5): qty 1

## 2021-04-06 MED ORDER — PROSOURCE TF PO LIQD
45.0000 mL | Freq: Three times a day (TID) | ORAL | Status: DC
  Administered 2021-04-06 – 2021-04-11 (×15): 45 mL
  Filled 2021-04-06 (×15): qty 45

## 2021-04-06 MED ORDER — CALCIUM GLUCONATE-NACL 1-0.675 GM/50ML-% IV SOLN
1.0000 g | Freq: Once | INTRAVENOUS | Status: AC
  Administered 2021-04-06: 1000 mg via INTRAVENOUS
  Filled 2021-04-06: qty 50

## 2021-04-06 NOTE — Progress Notes (Signed)
[facility] Progress Note  Subjective: seen in ICU  Vitals:   04/06/21 1126 04/06/21 1200 04/06/21 1300 04/06/21 1400  BP:  95/69 (!) 98/42 (!) 135/41  Pulse: 68 72 66 70  Resp: '16 15 16 16  ' Temp: (!) 97.52 F (36.4 C) 97.7 F (36.5 C) 97.7 F (36.5 C)   TempSrc:  Esophageal    SpO2: 100% 100% 100% 100%  Weight:      Height:        Exam:  on vent , not responsive  no jvd  throat ett in place  Chest cta bilat and lat  Cor reg no RG  Abd soft ntnd no ascites   Ext no LE edema   Bilat BKA and multiple finger amps R > L   Neuro on vent and sedated, not following commands   LUE AVF+bruit     OP HD: GO MWF   4h 43mn  425/500  73.5kg  2/2.5 bath  AVF 15g  Hep none  - mircera 150 q2 last 4/27, due 5/11  - venofer 50 q wk  - calcitriol 2.5 ug po tiw  - renvela 4 ac tid    5/02 CXR - patchy basilar infiltrates, mostly clear, no edema  Assessment/ Plan: 1. Cardiac arrest: EMS called for respiratory distress, patient deteriorated and eventually required king airway and CPR.  2. AMS: post arrest had seizure like activity and w/u per neuro suggests significant hypoxic/ anoxic brain injury. See neuro notes.  3. Resp failure: early CXR with large R infiltrate concerning for aspiration.  On IV unasyn, f/u CXR 5/02 showed sig clearing.  4.  ESRD:  Missed HD 4/29. Labs good. Had HD yest in ICU. HD today/ toniight in ICU.  5.  Hypertension/volume: BP is stable.   Started on IV amiodarone for v. Fib. SP Cardene gtt. Wts up 8kg not sure accurate but BP's have been high, and 3 L UF last HD went well w/o BP drops. Plan large UF w/ HD again today.  6.  Anemia: Hgb at goal. Not due for ESA dose 7.  Metabolic bone disease: Calcium elevated. NPO, calcitriol and renvela on hold.  8.  Nutrition:  NPO 9. PVD: Patient with history of severe PVD and multiple amputations.   Rob Carvel Huskins 04/06/2021, 3:25 PM   Recent Labs  Lab 04/05/21 0015 04/06/21 0032  K 3.6 5.0  BUN 32*  60*  CREATININE 4.59* 7.22*  CALCIUM 7.7* 7.0*  PHOS 4.1 7.3*  HGB 8.7* 8.8*   Inpatient medications: . amLODipine  10 mg Per Tube Daily  . B-complex with vitamin C  1 tablet Per Tube Daily  . carvedilol  12.5 mg Per Tube BID  . chlorhexidine gluconate (MEDLINE KIT)  15 mL Mouth Rinse BID  . Chlorhexidine Gluconate Cloth  6 each Topical Q0600  . docusate  100 mg Per Tube BID  . feeding supplement (PROSource TF)  45 mL Per Tube TID  . mouth rinse  15 mL Mouth Rinse 10 times per day  . pantoprazole (PROTONIX) IV  40 mg Intravenous QHS  . polyethylene glycol  17 g Per Tube Daily   . ampicillin-sulbactam (UNASYN) IV Stopped (04/06/21 0809)  . feeding supplement (VITAL 1.5 CAL)    . feeding supplement (VITAL HIGH PROTEIN) Stopped (04/06/21 1358)  . lactated ringers Stopped (04/06/21 1356)  . levETIRAcetam Stopped (04/05/21 1712)  . valproate sodium 52.5 mL/hr at 04/06/21 1400   acetaminophen, artificial tears, docusate, fentaNYL (SUBLIMAZE) injection, hydrALAZINE,  ondansetron (ZOFRAN) IV, polyethylene glycol

## 2021-04-06 NOTE — Progress Notes (Addendum)
Subjective: NAEO. Friends at bedside.   ROS: Unable to obtain due to poor mental status  Examination  Vital signs in last 24 hours: Temp:  [94.82 F (34.9 C)-98.42 F (36.9 C)] 97.7 F (36.5 C) (05/04 1300) Pulse Rate:  [59-72] 70 (05/04 1400) Resp:  [9-23] 16 (05/04 1400) BP: (95-182)/(37-89) 135/41 (05/04 1400) SpO2:  [100 %] 100 % (05/04 1400) FiO2 (%):  [40 %] 40 % (05/04 1200) Weight:  [82.5 kg] 82.5 kg (05/04 0400)  General: lying in bed, not in apparent distress CVS: pulse-normal rate and rhythm RS: Intubated, coarse breath sounds bilateral Extremities: Bilateral BKA, right hand amputation, significant peripheral arterial disease and left hand fingers Neuro:Comatose, does not open eyes to noxious stimuli, does not follow commands,PERLA, corneal reflex intact, gag reflex absent, does not withdraw to noxious stimuli in any extremity  Basic Metabolic Panel: Recent Labs  Lab 04/30/2021 0615 04/30/2021 0619 04/23/2021 1058 04/09/2021 2011 04/28/2021 2018 04/19/2021 2143 04/04/21 0410 04/04/21 0532 04/05/21 0015 04/06/21 0032  NA 141   < > 139   < > 139 140  --  138 134* 132*  K 4.2   < > 3.7   < > 4.7 3.9  --  4.4 3.6 5.0  CL 97*   < > 98  --  98  --   --  98 95* 95*  CO2 21*  --  19*  --  18*  --   --  15* 24 21*  GLUCOSE 240*   < > 205*  --  83  --   --  89 81 95  BUN 55*   < > 60*  --  70*  --   --  73* 32* 60*  CREATININE 9.45*   < > 9.61*  --  9.79*  --   --  10.15* 4.59* 7.22*  CALCIUM 10.3  --  8.1*  --  7.8*  --   --  7.2* 7.7* 7.0*  MG 2.8*  --   --   --   --   --  2.2  --  2.1 2.3  PHOS  --   --   --   --   --   --  8.4*  --  4.1 7.3*   < > = values in this interval not displayed.    CBC: Recent Labs  Lab 04/26/2021 0615 04/18/2021 0619 04/18/2021 1058 04/21/2021 2011 04/09/2021 2143 04/04/21 0410 04/05/21 0015 04/06/21 0032  WBC 13.6*  --  11.8*  --   --  14.7* 9.2 7.3  NEUTROABS 7.0  --   --   --   --   --   --   --   HGB 9.5*   < > 9.9* 10.2* 9.5* 7.9* 8.7*  8.8*  HCT 34.3*   < > 33.2* 30.0* 28.0* 25.7* 27.0* 27.9*  MCV 81.9  --  75.3*  --   --  74.5* 70.7* 72.1*  PLT 244  --  245  --   --  205 222 183   < > = values in this interval not displayed.     Coagulation Studies: No results for input(s): LABPROT, INR in the last 72 hours.  Imaging No new brain imaging overnight  ASSESSMENT AND PLAN: 60 year old male status post cardiorespiratory arrest.  Cardiorespiratory arrest Anoxic/hypoxic brain injury -LTM EEG shows generalized epileptogenicity as well as profound diffuse encephalopathy. 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   Recommendations - DC  LTM today as no definite seizures after weaning propofol - Continue Keppra 500 mg twice daily, Depakote250 mg every 8 hours -Patient has multiple comorbidities, had prolonged downtime of about 15 minutes, was having frequent myoclonic seizures on arrival which were very difficult to control, continues to have generalizedperiodic epileptiform discharges.I suspect patient has sustained severe anoxic/hypoxic brain injury with minimal to no chances of meaningful neurologic recovery. -Plan for MRI brain without contrast today to assess for extend of anoxic injury -Management of rest of comorbidities per primary team  I have spent a total of 35   minutes with the patient reviewing hospital notes,  test results, labs and examining the patient as well as establishing an assessment and plan that was discussed personally with Dr Lynetta Mare, NP [place] and patient;s friends at bedside..  > 50% of time was spent in direct patient care.    Zeb Comfort Epilepsy [facility] For questions after 5pm please refer to AMION to reach the Neurologist on call

## 2021-04-06 NOTE — Procedures (Addendum)
Patient Name:Marguerite Alatorre [id number] Epilepsy Attending:Dewight Catino Barbra Sarks Referring Physician/Provider:Dr Jacky Kindle Duration:04/05/2021 1529 to 04/06/2021 1211  Patient history:60yo M s/p cardiac arrest. EEG to evaluate for seizure  Level of alertness:comatose  AEDs during EEG study:Propofol, LEV, VPA  Technical aspects: This EEG study was done with scalp electrodes positioned according to the 10-20 International system of electrode placement. Electrical activity was acquired at a sampling rate of '500Hz'$  and reviewed with a high frequency filter of '70Hz'$  and a low frequency filter of '1Hz'$ . EEG data were recorded continuously and digitally stored.   Description:  EEG initially showed continuous generalized background attenuation.  As propofol was weaned off, EEG showed continuous generalized low amplitude 2 to 3 Hz delta slowing admixed with generalized periodic epileptiform discharges at 1.5 Hz.  ABNORMALITY -Periodic epileptiform discharges, generalized -Continuous slow, generalized  IMPRESSION: This studyshowed evidence of generalized epileptogenicity as well as severe to profound diffuse encephalopathy.  No definite seizures were seen during the study.  Avory Rahimi Barbra Sarks

## 2021-04-06 NOTE — Progress Notes (Addendum)
[name], MRN:  [id number], DOB:  January 05, 1961, LOS: 3 ADMISSION DATE:  04/15/2021, CONSULTATION DATE: 04/25/2021 REFERRING MD:  Veryl Speak, CHIEF COMPLAINT: Shortness of breath, respiratory arrest  History of Present Illness:  60 year old man with end-stage renal disease on dialysis, arterial disease status post bilateral BKA and multiple amputation of bilateral hands, was brought into the emergency department after he started feeling short of breath. Upon EMS note patient missed his hemodialysis on Friday, started with increasing shortness of breath so he called 911, in route he started having worsening hypoxia leading to respiratory arrest and asystole, CPR was initiated, [name]'s airway was placed, he was brought in the emergency department CPR was in progress.  Patient was intubated on arrival to ED, ROSC achieved in about 15 minutes, post ROSC patient was noted to have V. Tach Initial work-up showed lactic acidosis and bilateral pulmonary infiltrates consistent with aspiration pneumonia.  Head CT was done which showed no acute intracranial abnormalities.  PCCM consulted for admission  Pertinent  Medical History   Past Medical History:  Diagnosis Date  . DM type 2 (diabetes mellitus, type 2) ([place])   . ESRD (end stage renal disease) on dialysis ([place])   . HLD (hyperlipidemia)   . HTN (hypertension)   . PVD (peripheral vascular disease) ([place])     Significant Hospital Events: Including procedures, antibiotic start and stop dates in addition to other pertinent events   . 5/1 ETT . 5/2 evidence of myoclonus on EEG, on VPA with ongoing video EEG  Interim History / Subjective:  Sedation off yesterday, remained unresponsive   Objective   Blood pressure (!) 171/83, pulse 66, temperature (!) 96.26 F (35.7 C), resp. rate 20, height '6\' 3"'$  (1.905 m), weight 82.5 kg, SpO2 100 %.    Vent Mode: PRVC FiO2 (%):  [40 %] 40 % Set Rate:  [16 bmp] 16 bmp Vt Set:  [580 mL] 580 mL PEEP:   [5 cmH20] 5 cmH20 Plateau Pressure:  [14 cmH20-20 cmH20] 20 cmH20   Intake/Output Summary (Last 24 hours) at 04/06/2021 0820 Last data filed at 04/06/2021 0600 Gross per 24 hour  Intake 1888.54 ml  Output 190 ml  Net 1698.54 ml   Filed Weights   04/05/21 0600 04/05/21 0800 04/06/21 0400  Weight: 84.8 kg 84.6 kg 82.5 kg    Exam  General:  Middle aged male in NAD on vent Neuro:  Coma HEENT:  [place]/AT, No JVD noted, PERRL Cardiovascular:  RRR, no MRG Lungs:  clear Abdomen:  Soft, non-distended Musculoskeletal:  Bilateral BKA. Atrophied digits on L hand, amputation of R hand. RUE somewhat edematous.  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     Labs/imaging that I havepersonally reviewed  (right click and "Reselect all SmartList Selections" daily)   WBC 9.2 Hgb 8.7 5/2 blood cultures x2>> pending  Resolved Hospital Problem list     Assessment & Plan:    Acute respiratory arrest leading to PEA cardiac arrest with subsequent NSVT, aspiration pneumonia Wife reports patient had missed dialysis and was not taking his home Keppra, suspect he seized and had an aspiration event leading to hypoxic respiratory failure.  Echo showed EF 50 to 55% with grade 1 diastolic failure  P: -TTM protocol finished -Will need cards eval and LHC if he is able to make a recovery.  -Continue Unasyn x 5 days, follow respiratory culture and blood cultures. -Full vent support -Bronchial hygiene and RT/bronchodilator protocol.  Acute anoxic encaphalopathy Seizure disorder  EEG initially with myoclonic seizures every 2 to 3 minutes, ceased on 5 1 however EEG continues to show generalized epileptogenicity in the city and profound diffuse encephalopathy. Initial head CT negative P: -Appreciate neurology recommendations, started on Keppra, Valproate -propofol DC 5/3 at 1400.  -MRI brain today -Intermittently assess neuro exam, with goal for neuroprognostication at the 96  hour mark.   Sinus bradycardia QTC prolongation (622) > 440 - Tele monitroing - Amio DC 5/3  ESRD Hypocalcemia -Appreciate nephrology consult -For HD today -Follow BMP  Hypertension: Initially in hypertensive emergency and started on Cardene, blood pressure predictably dropped after high-dose propofol initiated P: -Resume home Norvasc and coreg -holding home hydralazine. Can restart if still hypertensive after above started.  -PRN hydralazine to keep SBP < 176mHg  Type 2 diabetes melliuts Glucose 80s, home Lantus held P: -Continue SSI -TF started 5/3 - need to stick ear for CBG. Samples from hands have been falsely low.   RUE edema - doppler pending   Best practice (right click and "Reselect all SmartList Selections" daily)  Diet:  Tube Feed  Pain/Anxiety/Delirium protocol (if indicated): No VAP protocol (if indicated): Yes DVT prophylaxis: Subcutaneous Heparin GI prophylaxis: PPI Glucose control:  SSI Yes Central venous access:  N/A Arterial line:  N/A Foley:  N/A Mobility:  bed rest  PT consulted: N/A Last date of multidisciplinary goals of care discussion [pending]: Wife updated with current status 5/4. She will come to bedside 5/5 to discuss further goals.  Code Status:  full code Disposition: ICU    Critical care time: 36 minutes     PGeorgann Housekeeper AGACNP-BC LEdgertonfor personal pager PCCM on call pager ([phone] 7pm. Please call Elink 7p-7a. [phone] 04/06/2021 8:20 AM

## 2021-04-06 NOTE — Progress Notes (Signed)
Upper extremity venous has been completed.   Preliminary results in CV Proc.   Abram Sander 04/06/2021 10:36 AM

## 2021-04-06 NOTE — Progress Notes (Signed)
Nutrition Follow Up  DOCUMENTATION CODES:   Non-severe (moderate) malnutrition in context of chronic illness  INTERVENTION:   Transition tube feeding:  -Vital 1.5 @ 60 ml/hr via OG (1440 ml) -ProSource TF 45 ml TID -B complex with vitamin C to account for losses with HD  Provides: 2280 kcals, 130 grams protein, 1100 ml free water.   NUTRITION DIAGNOSIS:   Moderate Malnutrition related to chronic illness (ESRD on HD) as evidenced by mild fat depletion,moderate muscle depletion.  Ongoing  GOAL:   Patient will meet greater than or equal to 90% of their needs   Addressed via TF  MONITOR:   Vent status,TF tolerance,Skin,Weight trends,Labs,I & O's  REASON FOR ASSESSMENT:   Ventilator,Consult Enteral/tube feeding initiation and management  ASSESSMENT:   Patient with PMH significant for DM, ESRD on HD, HLD, HTN, and PVD s/p bilateral BKA. Presents this admission with asystolic cardiac arrest in the setting of hypoxia and hypercapnia.  Pt discussed during ICU rounds and with RN.   TTM finished. Sedation turned off, remains unresponsive. HD today. MRI brain later this afternoon, goals of care meeting in am. Having difficult time obtained true CBG readings via finger, nursing to stick ear. Transition tube feeding formula to better meet need now that propofol is off.   EDW: 73.5 kg  Current weight: 82.5 kg   Patient remains intubated on ventilator support MV: 9.6 L/min Temp (24hrs), Avg:96.7 F (35.9 C), Min:94.82 F (34.9 C), Max:98.42 F (36.9 C)  Drips: LR @ 10 ml/hr  Medications: colace, miralax Labs: Na 132 (L) Phosphorus 7.3 (H) CBG 80-114  Diet Order:   Diet Order            Diet NPO time specified  Diet effective now                 EDUCATION NEEDS:   Not appropriate for education at this time  Skin:  Skin Assessment: Skin Integrity Issues: Skin Integrity Issues:: Other (Comment) Other: wound- R knee, hands, penis Non pressure wound- R buttocks    Last BM:  5/4  Height:   Ht Readings from Last 1 Encounters:  05/03/2021 '6\' 3"'$  (1.905 m)    Weight:   Wt Readings from Last 1 Encounters:  04/06/21 82.5 kg    BMI:  Body mass index is 22.73 kg/m.  Estimated Nutritional Needs:   Kcal:  2150-2500 kcal  Protein:  130-145 grams  Fluid:  1000 ml + UOP   Mariana Single RD, LDN Clinical Nutrition Pager listed in [place]

## 2021-04-06 NOTE — Plan of Care (Signed)

## 2021-04-06 NOTE — Progress Notes (Signed)
LTM discontinued. No skin breakdown was seen. 

## 2021-04-06 NOTE — Progress Notes (Signed)
eLink Physician-Brief Progress Note Patient Name: Imothy Bland DOB: [birth year] MRN: [id number]   Date of Service  04/06/2021  HPI/Events of Note  Patient with sub-optimal blood pressure control.  eICU Interventions  PRN dose and dosing interval of Hydralazine adjusted to optimize BP control pending dialysis to normalize circulating  Blood volume.        Kerry Kass Calvyn Kurtzman 04/06/2021, 3:45 AM

## 2021-04-06 NOTE — Progress Notes (Signed)
Progress Note  Patient Name: [name] Date of Encounter: 04/07/2021  [facility] Cardiologist: None   Subjective   MRI with extensive diffusion-weighted and T2/FLAIR hyperintense signal changes within the braincompatible with hypoxic/ischemic injury  Remains intubated and non-responsive. Ongoing goals of care discussions occurring with family.  Inpatient Medications    Scheduled Meds: . amLODipine  10 mg Per Tube Daily  . B-complex with vitamin C  1 tablet Per Tube Daily  . carvedilol  12.5 mg Per Tube BID  . chlorhexidine gluconate (MEDLINE KIT)  15 mL Mouth Rinse BID  . Chlorhexidine Gluconate Cloth  6 each Topical Q0600  . docusate  100 mg Per Tube BID  . feeding supplement (PROSource TF)  45 mL Per Tube TID  . mouth rinse  15 mL Mouth Rinse 10 times per day  . pantoprazole (PROTONIX) IV  40 mg Intravenous QHS  . polyethylene glycol  17 g Per Tube Daily   Continuous Infusions: . ampicillin-sulbactam (UNASYN) IV Stopped (04/06/21 0809)  . feeding supplement (VITAL 1.5 CAL) 1,000 mL (04/06/21 1603)  . lactated ringers 10 mL/hr at 04/07/21 0600  . levETIRAcetam Stopped (04/06/21 1659)  . valproate sodium 250 mg (04/07/21 0600)   PRN Meds: acetaminophen, artificial tears, docusate, fentaNYL (SUBLIMAZE) injection, ondansetron (ZOFRAN) IV, polyethylene glycol   Vital Signs    Vitals:   04/07/21 0444 04/07/21 0500 04/07/21 0530 04/07/21 0600  BP:  (!) 122/34 (!) 112/35 (!) 132/37  Pulse: 75 73 73 72  Resp: (!) 0 (!) 0 (!) 0 13  Temp:      TempSrc:      SpO2: 100% 100% 100% 100%  Weight:      Height:        Intake/Output Summary (Last 24 hours) at 04/07/2021 0734 Last data filed at 04/07/2021 0600 Gross per 24 hour  Intake 1906.82 ml  Output 2957 ml  Net -1050.18 ml   Last 3 Weights 04/07/2021 04/06/2021 04/06/2021  Weight (lbs) 177 lb 7.5 oz 183 lb 10.3 oz 186 lb 4.6 oz  Weight (kg) 80.5 kg 83.3 kg 84.5 kg      Telemetry    NSR - Personally  Reviewed  ECG    No new tracing - Personally Reviewed  Physical Exam   GEN: Intubated, non-responsive   Neck: No JVD Cardiac: RRR, soft systolic murmur Respiratory: Coarse vent sounds GI: Soft,  non-distended  MS: Bilateral BKA, warm Neuro:  Nonfocal  Psych: Normal affect   Labs    High Sensitivity Troponin:   Recent Labs  Lab 04/20/2021 0615 04/30/2021 1058  TROPONINIHS 47* 253*      Chemistry Recent Labs  Lab 05/03/2021 0615 04/27/2021 0619 04/04/21 0532 04/05/21 0015 04/06/21 0032  NA 141   < > 138 134* 132*  K 4.2   < > 4.4 3.6 5.0  CL 97*   < > 98 95* 95*  CO2 21*   < > 15* 24 21*  GLUCOSE 240*   < > 89 81 95  BUN 55*   < > 73* 32* 60*  CREATININE 9.45*   < > 10.15* 4.59* 7.22*  CALCIUM 10.3   < > 7.2* 7.7* 7.0*  PROT 6.4*  --   --   --   --   ALBUMIN 2.4*  --   --   --   --   AST 23  --   --   --   --   ALT 9  --   --   --   --  ALKPHOS 54  --   --   --   --   BILITOT 0.8  --   --   --   --   GFRNONAA 6*   < > 5* 14* 8*  ANIONGAP 23*   < > 25* 15 16*   < > = values in this interval not displayed.     Hematology Recent Labs  Lab 04/04/21 0410 04/05/21 0015 04/06/21 0032  WBC 14.7* 9.2 7.3  RBC 3.45* 3.82* 3.87*  HGB 7.9* 8.7* 8.8*  HCT 25.7* 27.0* 27.9*  MCV 74.5* 70.7* 72.1*  MCH 22.9* 22.8* 22.7*  MCHC 30.7 32.2 31.5  RDW 22.8* 22.6* 22.9*  PLT 205 222 183    BNP Recent Labs  Lab 04/26/2021 0616  BNP 2,218.2*     DDimer  Recent Labs  Lab 04/04/21 0359  DDIMER 10.71*     Radiology    MR BRAIN WO CONTRAST  Result Date: 04/06/2021 CLINICAL DATA:  Anoxic brain damage. EXAM: MRI HEAD WITHOUT CONTRAST TECHNIQUE: Multiplanar, multiecho pulse sequences of the brain and surrounding structures were obtained without intravenous contrast. COMPARISON:  Prior head CT examinations 04/07/2021 and earlier. Brain MRI 01/22/2021. FINDINGS: Brain: Cerebral volume is normal for age. There is extensive cortically based restricted diffusion and T2/FLAIR  hyperintense signal abnormality within the bilateral cerebral hemispheres. Diffusion-weighted signal abnormality is also present within the bilateral hippocampi, basal ganglia and thalami. Subtle diffusion-weighted signal abnormality is also questioned within the cerebellum. These findings are compatible with hypoxic/ischemic injury. Redemonstrated small focus of chronic encephalomalacia within the anterior right frontal lobe. Mild multifocal T2/FLAIR hyperintensity within the cerebral white matter, nonspecific but compatible with chronic small vessel ischemic disease. Redemonstrated small chronic infarcts within the left cerebellar hemisphere. No evidence of intracranial mass. No chronic intracranial blood products. No extra-axial fluid collection. No midline shift. Vascular: Redemonstrated signal abnormality within the V4 left vertebral artery suggestive of high-grade stenosis. There are otherwise expected proximal large arterial flow voids. Skull and upper cervical spine: No focal marrow lesion. Sinuses/Orbits: Visualized orbits show no acute finding. Extensive partial opacification of the bilateral ethmoid air cells. Fluid levels within the sphenoid sinuses and left maxillary sinus. Other: Large volume secretions within the visualized nasopharynx. Right greater than left mastoid effusions. IMPRESSION: Extensive diffusion-weighted and T2/FLAIR hyperintense signal changes within the brain, as described and compatible with hypoxic/ischemic injury. No significant mass effect at this time. Otherwise stable non-contrast MRI appearance of the brain as compared to 01/22/2021. Small focus of chronic encephalomalacia within the anterior right frontal lobe. Mild cerebral white matter chronic small vessel ischemic disease. 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 Signal abnormality within the V4 left vertebral artery suggesting high-grade stenosis. Paranasal sinus disease and mastoid effusions in the  setting of life-support tubes. Electronically Signed   By: Kellie Simmering DO   On: 04/06/2021 17:30   DG Abd Portable 1V  Result Date: 04/05/2021 CLINICAL DATA:  Complication of feeding tube EXAM: PORTABLE ABDOMEN - 1 VIEW COMPARISON:  None. FINDINGS: NG tube in the lateral body of the stomach. No bowel obstruction. Mild gas in the colon may represent mild ileus. No urinary tract calculi. No acute skeletal abnormality. IMPRESSION: NG tube in the body the stomach. Mildly distended colon. Electronically Signed   By: Franchot Gallo M.D.   On: 04/05/2021 20:11   VAS [place] UPPER EXTREMITY VENOUS DUPLEX  Result Date: 04/06/2021 UPPER VENOUS STUDY  Patient Name:  OLIN GURSKI  Date of Exam:   04/06/2021  Medical Rec #: [id number]       Accession #:    [id number] Date of Birth: 06-03-1961       Patient Gender: M Patient Age:   60Y Exam Location:  [facility] Procedure:      VAS [place] UPPER EXTREMITY VENOUS DUPLEX Referring Phys: [id number] Silvestre Moment [facility] --------------------------------------------------------------------------------  Indications: Edema Comparison Study: no prior Performing Technologist: Abram Sander RVS  Examination Guidelines: A complete evaluation includes B-mode imaging, spectral Doppler, color Doppler, and power Doppler as needed of all accessible portions of each vessel. Bilateral testing is considered an integral part of a complete examination. Limited examinations for reoccurring indications may be performed as noted.  Right Findings: +----------+------------+---------+-----------+----------+-----------------+ RIGHT     CompressiblePhasicitySpontaneousProperties     Summary      +----------+------------+---------+-----------+----------+-----------------+ IJV           Full       Yes       Yes                                +----------+------------+---------+-----------+----------+-----------------+ Subclavian    Full       Yes       Yes                                 +----------+------------+---------+-----------+----------+-----------------+ Axillary      Full       Yes       Yes                                +----------+------------+---------+-----------+----------+-----------------+ Brachial      Full       Yes       Yes                                +----------+------------+---------+-----------+----------+-----------------+ Radial      Partial                                 Age Indeterminate +----------+------------+---------+-----------+----------+-----------------+ Ulnar         Full                                                    +----------+------------+---------+-----------+----------+-----------------+ Cephalic      None                                  Age Indeterminate +----------+------------+---------+-----------+----------+-----------------+ Basilic       Full                                                    +----------+------------+---------+-----------+----------+-----------------+  Summary:  Right: Findings consistent with age indeterminate deep vein thrombosis involving the right radial veins. Findings consistent with age indeterminate superficial vein thrombosis involving the right cephalic vein.  *See table(s) above for measurements and observations.  Diagnosing physician: Jamelle Haring Electronically signed by Jamelle Haring on 04/06/2021 at 5:07:29 PM.    Final     Cardiac Studies   TTE 04/24/2021: IMPRESSIONS 1. Left ventricular ejection fraction, by estimation, is 50 to 55%. The  left ventricle has low normal function. The left ventricle has no regional  wall motion abnormalities. There is severe left ventricular hypertrophy.  Left ventricular diastolic  parameters are consistent with Grade I diastolic dysfunction (impaired  relaxation).  2. Right ventricular systolic function is normal. The right ventricular  size is normal. There is mildly elevated pulmonary artery systolic  pressure.   3. The mitral valve is normal in structure. No evidence of mitral valve  regurgitation. No evidence of mitral stenosis.  4. The aortic valve is calcified. Aortic valve regurgitation is not  visualized. Mild to moderate aortic valve sclerosis/calcification is  present, without any evidence of aortic stenosis.  5. The inferior vena cava is normal in size with greater than 50%  respiratory variability, suggesting right atrial pressure of 3 mmHg.   Patient Profile     60 y.o. 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  Assessment & Plan    #Cardiac arrest #VT Patient presented with respiratory failure complicated by cardiac arrest withinitial rhythm reported as asystole (?)requiring ~156m of CPR. Givenepi and lido with ROSC. On arrival to ER, he was in brief episode ofVTthat converted (do not believe shock was administered)No known hisotry of CAD but has several risk factors. TTE with normal EF, no significant WMA. Unfortunately, overall poor prognosis with extensive hypoxic brain injury on MRI.  -MRI brain with extensive hypoxic brain injury -No plans for further cardiac work-up at this time given overall poor prognosis -Ongoing goals of care conversation with family  #Aspiration PNA: Likely a complication of respiratory failure in the field s/p intubation. 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 On keppra and valproate. Propofol off currently -Management per neurology  #Prolonged QTc: Improved. No significant ectopy on monitor -Monitor closely for recurrence of VT -Off amiodarone gtt; no significant  ectopy -Keep K>4, Mg>2  #HTN: Previously on nicardipine gtt; weaned off due tohypotension with sedation.Now improved and resumed on some home antihypertensives -Continue amlodipine 1046maily -Continue coreg 12.5mg50mD -Resume hydralazine as needed if remains hypertensive  #PVD: Extensive amputation history including bilateral BKA, amputation of fingers on right hand, and amputation of thumb/5th fingers on left hand. He is on plavix outpatient.  -Ongoing goals of care discussions  #HLD: -Holding statin; resume pending GOC [place]  #ESRD on HD: Missed HD on 04/01/21, however, electrolytes fairly stable on admission and likely not the primary driver of arrest. 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 Cardiology will sign-off.     For questions or updates, please contact [facility] consult [email or web address] for contact info under        Signed, HeatFreada Bergeron  04/07/2021, 7:34 AM

## 2021-04-06 NOTE — Social Work (Signed)
CSW received consult regarding transportation assistance. CSW spoke with patients nurse who confirmed no current assistance needed at this time. CSW will continue to follow.

## 2021-04-07 DIAGNOSIS — G931 Anoxic brain damage, not elsewhere classified: Secondary | ICD-10-CM | POA: Diagnosis not present

## 2021-04-07 DIAGNOSIS — I469 Cardiac arrest, cause unspecified: Secondary | ICD-10-CM | POA: Diagnosis not present

## 2021-04-07 DIAGNOSIS — J9601 Acute respiratory failure with hypoxia: Secondary | ICD-10-CM | POA: Diagnosis not present

## 2021-04-07 LAB — BASIC METABOLIC PANEL
Anion gap: 17 — ABNORMAL HIGH (ref 5–15)
BUN: 50 mg/dL — ABNORMAL HIGH (ref 8–23)
CO2: 23 mmol/L (ref 22–32)
Calcium: 7.8 mg/dL — ABNORMAL LOW (ref 8.9–10.3)
Chloride: 93 mmol/L — ABNORMAL LOW (ref 98–111)
Creatinine, Ser: 5.35 mg/dL — ABNORMAL HIGH (ref 0.61–1.24)
GFR, Estimated: 11 mL/min — ABNORMAL LOW (ref >60)
Glucose, Bld: 131 mg/dL — ABNORMAL HIGH (ref 70–99)
Potassium: 5.2 mmol/L — ABNORMAL HIGH (ref 3.5–5.1)
Sodium: 133 mmol/L — ABNORMAL LOW (ref 135–145)

## 2021-04-07 LAB — CBC
HCT: 31 % — ABNORMAL LOW (ref 39.0–52.0)
Hemoglobin: 9.7 g/dL — ABNORMAL LOW (ref 13.0–17.0)
MCH: 22.8 pg — ABNORMAL LOW (ref 26.0–34.0)
MCHC: 31.3 g/dL (ref 30.0–36.0)
MCV: 72.8 fL — ABNORMAL LOW (ref 80.0–100.0)
Platelets: 196 10*3/uL (ref 150–400)
RBC: 4.26 MIL/uL (ref 4.22–5.81)
RDW: 23.6 % — ABNORMAL HIGH (ref 11.5–15.5)
WBC: 7.4 10*3/uL (ref 4.0–10.5)
nRBC: 0.3 % — ABNORMAL HIGH (ref 0.0–0.2)

## 2021-04-07 LAB — PHOSPHORUS: Phosphorus: 6.1 mg/dL — ABNORMAL HIGH (ref 2.5–4.6)

## 2021-04-07 LAB — MAGNESIUM: Magnesium: 2.3 mg/dL (ref 1.7–2.4)

## 2021-04-07 MED ORDER — HEPARIN SODIUM (PORCINE) 5000 UNIT/ML IJ SOLN
5000.0000 [IU] | Freq: Three times a day (TID) | INTRAMUSCULAR | Status: DC
  Administered 2021-04-07 – 2021-04-11 (×12): 5000 [IU] via SUBCUTANEOUS
  Filled 2021-04-07 (×12): qty 1

## 2021-04-07 MED ORDER — ASPIRIN 81 MG PO CHEW
81.0000 mg | CHEWABLE_TABLET | Freq: Every day | ORAL | Status: DC
  Administered 2021-04-07 – 2021-04-11 (×5): 81 mg
  Filled 2021-04-07 (×5): qty 1

## 2021-04-07 MED ORDER — CARVEDILOL 12.5 MG PO TABS
12.5000 mg | ORAL_TABLET | Freq: Two times a day (BID) | ORAL | Status: DC
  Administered 2021-04-08 – 2021-04-11 (×7): 12.5 mg
  Filled 2021-04-07 (×7): qty 1

## 2021-04-07 NOTE — Progress Notes (Signed)
[facility] Progress Note  Subjective: seen in ICU. Not waking up.   Vitals:   04/07/21 1154 04/07/21 1200 04/07/21 1300 04/07/21 1400  BP:  (!) 131/41 (!) 135/50 (!) 114/34  Pulse:  76 77 75  Resp:  '16 16 11  ' Temp: 99 F (37.2 C)     TempSrc: Axillary     SpO2:  100% 100% 100%  Weight:      Height:        Exam:  on vent , not responsive  no jvd  throat ett in place  Chest cta bilat and lat  Cor reg no RG  Abd soft ntnd no ascites   Ext no LE edema   Bilat BKA and multiple finger amps R > L   Neuro on vent and sedated, not following commands   LUE AVF+bruit     OP HD: GO MWF   4h 4mn  425/500  73.5kg  2/2.5 bath  AVF 15g  Hep none  - mircera 150 q2 last 4/27, due 5/11  - venofer 50 q wk  - calcitriol 2.5 ug po tiw  - renvela 4 ac tid    5/02 CXR - patchy basilar infiltrates, mostly clear, no edema  Assessment/ Plan: 1. Cardiac arrest: EMS called for respiratory distress, patient deteriorated and eventually required king airway and CPR.  2. AMS: post arrest had seizure like activity and w/u per neuro suggests significant hypoxic/ anoxic brain injury. On IV depakote and Keppra. Per neuro.  3. Resp failure: early CXR with large R infiltrate concerning for aspiration.  On IV unasyn, f/u CXR 5/02 showed sig clearing.  4.  ESRD:  Missed HD 4/29. HD here on MWF schedule, HD tomorrow in ICU.  5.  HTN/volume: BP is stable. SP Cardene gtt. Norvasc and coreg thru NG tube. Wts up 8kg not sure accurate. 3 L off last HD. Plan 2-3 L goal w/ HD tomorrow as BP tolerates.   6.  Anemia: Hgb at goal. Not due for ESA dose 7.  Metabolic bone disease: Calcium elevated. NPO, calcitriol and renvela on hold.  8.  Nutrition: tube feeds 9. PVD: Patient with history of severe PVD and multiple amputations.   Rob Dyneshia Baccam 04/07/2021, 2:39 PM   Recent Labs  Lab 04/06/21 0032 04/07/21 0641 04/07/21 0906  K 5.0 5.2*  --   BUN 60* 50*  --   CREATININE 7.22* 5.35*  --    CALCIUM 7.0* 7.8*  --   PHOS 7.3* 6.1*  --   HGB 8.8*  --  9.7*   Inpatient medications: . amLODipine  10 mg Per Tube Daily  . aspirin  81 mg Per Tube Daily  . B-complex with vitamin C  1 tablet Per Tube Daily  . carvedilol  12.5 mg Per Tube BID  . chlorhexidine gluconate (MEDLINE KIT)  15 mL Mouth Rinse BID  . Chlorhexidine Gluconate Cloth  6 each Topical Q0600  . docusate  100 mg Per Tube BID  . feeding supplement (PROSource TF)  45 mL Per Tube TID  . heparin injection (subcutaneous)  5,000 Units Subcutaneous Q8H  . mouth rinse  15 mL Mouth Rinse 10 times per day  . pantoprazole (PROTONIX) IV  40 mg Intravenous QHS  . polyethylene glycol  17 g Per Tube Daily   . ampicillin-sulbactam (UNASYN) IV Stopped (04/07/21 0850)  . feeding supplement (VITAL 1.5 CAL) 60 mL/hr at 04/07/21 1400  . lactated ringers 10 mL/hr at 04/07/21 1400  .  levETIRAcetam Stopped (04/06/21 1659)  . valproate sodium 250 mg (04/07/21 1419)   acetaminophen, artificial tears, docusate, ondansetron (ZOFRAN) IV, polyethylene glycol

## 2021-04-07 NOTE — Progress Notes (Signed)
NAMEOrbin [name], MRN:  [id number], DOB:  [birth year], LOS: 4 ADMISSION DATE:  04/04/2021, CONSULTATION DATE: 04/27/2021 REFERRING MD:  Veryl Speak, CHIEF COMPLAINT: Shortness of breath, respiratory arrest  History of Present Illness:  60 year old man with end-stage renal disease on dialysis, arterial disease status post bilateral BKA and multiple amputation of bilateral hands, was brought into the emergency department after he started feeling short of breath. Upon EMS note patient missed his hemodialysis on Friday, started with increasing shortness of breath so he called 911, in route he started having worsening hypoxia leading to respiratory arrest and asystole, CPR was initiated, [name]'s airway was placed, he was brought in the emergency department CPR was in progress.  Patient was intubated on arrival to ED, ROSC achieved in about 15 minutes, post ROSC patient was noted to have V. Tach Initial work-up showed lactic acidosis and bilateral pulmonary infiltrates consistent with aspiration pneumonia.  Head CT was done which showed no acute intracranial abnormalities.  PCCM consulted for admission  Pertinent  Medical History   Past Medical History:  Diagnosis Date  . DM type 2 (diabetes mellitus, type 2) ([place])   . ESRD (end stage renal disease) on dialysis ([place])   . HLD (hyperlipidemia)   . HTN (hypertension)   . PVD (peripheral vascular disease) ([place])     Significant Hospital Events: Including procedures, antibiotic start and stop dates in addition to other pertinent events   . 5/1 ETT . 5/2 evidence of myoclonus on EEG, on VPA with ongoing video EEG . 5/4 evidence of anoxic injury on MRI brain . 5/5 MRI results communicated to wife, encouraged to reflect on acceptable QOL   Interim History / Subjective:  MRI brain 5/4 with evidence of anoxia  NAEO for patient, remains comatose    Wife and friend at bedside. Discussed neuro status with wife and goals of care (encouraged to  consider QOL). I asked pts wife if she would like to see a small neurologic exam to which she said yes -- I showed her his apnea, lack of corneals, lack of response to pain, and lack of cough.   Objective   Blood pressure 104/71, pulse 72, temperature 98 F (36.7 C), temperature source Oral, resp. rate 11, height '6\' 3"'$  (1.905 m), weight 80.5 kg, SpO2 100 %.    Vent Mode: PRVC FiO2 (%):  [40 %] 40 % Set Rate:  [16 bmp] 16 bmp Vt Set:  [580 mL] 580 mL PEEP:  [5 cmH20] 5 cmH20 Plateau Pressure:  [15 cmH20-20 cmH20] 15 cmH20   Intake/Output Summary (Last 24 hours) at 04/07/2021 0931 Last data filed at 04/07/2021 0800 Gross per 24 hour  Intake 1723.42 ml  Output 2957 ml  Net -1233.58 ml   Filed Weights   04/06/21 1833 04/06/21 2300 04/07/21 0200  Weight: 84.5 kg 83.3 kg 80.5 kg    Exam  General:  Chronically and critically ill appearing middle aged M intubated no sedation  Neuro:  Off sedation, comatose. Apnea, no corneals, no cough no response to painful stimuli  HEENT:  NCAT ETT secure anicteric sclera  Cardiovascular:  rrr s1s2 no rgm  Lungs: Symmetrical chest expansion, mechanically ventilated, CTAb  Abdomen:  Soft nd + bowel sounds  Musculoskeletal:  Bilateral BKA. R hand digit amputation, L hand digit atrophy. No acute deformity  Skin: c/d/w. Calciphylaxis breakdown   Labs/imaging that I havepersonally reviewed  (right click and "Reselect all SmartList Selections" daily)  BMP Na 133 K 5.2 Cr 5.35  AG 17 CBC Hgb 9.7  MRI Brain 5/4 evidence of anoxic brain injury   Resolved Hospital Problem list   Sinus bradycardia  Hypertensive emergency  Assessment & Plan:   Acute anoxic encephalopathy  -MRI 5/4 with evidence of anoxic injury  Seizure disorder  EEG initially with myoclonic seizures every 2 to 3 minutes, ceased on 5 1 however EEG continues to show generalized epileptogenicity in the city and profound diffuse encephalopathy. Initial head CT negative P: -Neuro  following --  Keppra, Valproate -propofol DC 5/3 at 1400. Cont to old sedation  -GOC in light of MRI findings   Acute respiratory failure with hypoxia requiring intubation Aspiration PNA P -cont MV support -VAP, PAD  -Unasyn x 5d course   Acute respiratory arrest with subsequent PEA arrest NSVT Wife reports patient had missed dialysis and was not taking his home Keppra, suspect he seized and had an aspiration event leading to hypoxic respiratory failure.  Echo showed EF 50 to 55% with grade 1 diastolic failure P -Unlikely that pt will have a meaningful recovery, if he did would need a LHC eventually  -supportive care   Prolonged qtc - Tele monitroing -continues off amio  ESRD on HD Hypocalcemia Hyperkalemia -HD per nephro   HTN  Initially in hypertensive emergency and started on Cardene, blood pressure predictably dropped after high-dose propofol initiated P: -Resume home Norvasc and coreg -holding home hydralazine. Can restart if still hypertensive after above started.  -PRN hydralazine to keep SBP < 143mHg  Type 2 diabetes melliuts Glucose 80s, home Lantus held P: -Continue SSI -TF started 5/3 - need to stick ear for CBG. Samples from hands have been falsely low.   R radial DVT, age indeterminate  - also with indeterminate age superficial venous thrombosis R cephalic  P -d/w ccm physician-- will start with enteral ASA. Ongoing GOC in setting of anoxic injury above  -eliquis for dvt had been dc    Best practice (right click and "Reselect all SmartList Selections" daily)  Diet:  Tube Feed  Pain/Anxiety/Delirium protocol (if indicated): No VAP protocol (if indicated): Yes DVT prophylaxis: Subcutaneous Heparin GI prophylaxis: PPI Glucose control:  SSI Yes Central venous access:  N/A Arterial line:  N/A Foley:  N/A Mobility:  bed rest  PT consulted: N/A Last date of multidisciplinary goals of care discussion: CCM NP discussed Anoxic injury and current  critical illness course with wife 5/5 at bedside. Courtenay RN present. Encouraged to consider acceptable QOL and GOC-- briefly spoke of differences between aggressive interventions vs comfort apprpoach. Wife plans to discuss with family Code Status:  full code Disposition: ICU    CRITICAL CARE Performed by: GCristal Generous  Total critical care time: 49 minutes  Critical care time was exclusive of separately billable procedures and treating other patients.  Critical care was necessary to treat or prevent imminent or life-threatening deterioration.  Critical care was time spent personally by me on the following activities: development of treatment plan with patient and/or surrogate as well as nursing, discussions with consultants, evaluation of patient's response to treatment, examination of patient, obtaining history from patient or surrogate, ordering and performing treatments and interventions, ordering and review of laboratory studies, ordering and review of radiographic studies, pulse oximetry and re-evaluation of patient's condition.   GEliseo GumMSN, AGACNP-BC LNew Havenfor pager details  04/07/2021, 10:42 AM

## 2021-04-07 NOTE — Progress Notes (Addendum)
Subjective: NAEO. Friends at bedside.   ROS: Unable to obtain due to poor mental status  Examination  Vital signs in last 24 hours: Temp:  [97.16 F (36.2 C)-98.4 F (36.9 C)] 98 F (36.7 C) (05/05 0741) Pulse Rate:  [64-81] 72 (05/05 0800) Resp:  [0-17] 11 (05/05 0800) BP: (95-158)/(27-76) 104/71 (05/05 0800) SpO2:  [99 %-100 %] 100 % (05/05 0800) FiO2 (%):  [40 %] 40 % (05/05 0743) Weight:  [80.5 kg-84.5 kg] 80.5 kg (05/05 0200)  General: lying in bed,not in apparent distress CVS: pulse-normal rate and rhythm [id number], coarse breath sounds bilateral Extremities:Bilateral BKA, right hand amputation, significant peripheral arterial disease and left hand fingers Neuro:Comatose, does not open eyes to noxious stimuli, does not follow commands,PERLA, corneal reflex intact, gag reflex absent, does not withdraw to noxious stimuli in any extremity  Basic Metabolic Panel: Recent Labs  Lab 04/09/2021 0615 04/16/2021 [id number] 05/01/2021 2018 04/25/2021 2143 04/04/21 0410 04/04/21 0532 04/05/21 0015 04/06/21 0032 04/07/21 0641  NA 141   < > 139 140  --  138 134* 132* 133*  K 4.2   < > 4.7 3.9  --  4.4 3.6 5.0 5.2*  CL 97*   < > 98  --   --  98 95* 95* 93*  CO2 21*   < > 18*  --   --  15* 24 21* 23  GLUCOSE 240*   < > 83  --   --  89 81 95 131*  BUN 55*   < > 70*  --   --  73* 32* 60* 50*  CREATININE 9.45*   < > 9.79*  --   --  10.15* 4.59* 7.22* 5.35*  CALCIUM 10.3   < > 7.8*  --   --  7.2* 7.7* 7.0* 7.8*  MG 2.8*  --   --   --  2.2  --  2.1 2.3 2.3  PHOS  --   --   --   --  8.4*  --  4.1 7.3* 6.1*   < > = values in this interval not displayed.    CBC: Recent Labs  Lab 04/25/2021 0615 04/30/2021 0619 04/16/2021 1058 04/15/2021 2011 04/18/2021 2143 04/04/21 0410 04/05/21 0015 04/06/21 0032  WBC 13.6*  --  11.8*  --   --  14.7* 9.2 7.3  NEUTROABS 7.0  --   --   --   --   --   --   --   HGB 9.5*   < > 9.9* 10.2* 9.5* 7.9* 8.7* 8.8*  HCT 34.3*   < > 33.2* 30.0* 28.0* 25.7* 27.0*  27.9*  MCV 81.9  --  75.3*  --   --  74.5* 70.7* 72.1*  PLT 244  --  245  --   --  205 222 183   < > = values in this interval not displayed.     Coagulation Studies: No results for input(s): LABPROT, INR in the last 72 hours.  Imaging MR Brain wo contrast 04/06/2021: Extensive diffusion-weighted and T2/FLAIR hyperintense signal changes within the brain, as described and compatible with hypoxic/ischemic injury. No significant mass effect at this time. Otherwise stable non-contrast MRI appearance of the brain as compared to 01/22/2021. Small focus of chronic encephalomalacia within the anterior right frontal lobe. Mild cerebral white matter chronic small vessel ischemic disease. 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 Signal abnormality within the V4 left vertebral artery suggesting high-grade stenosis. Paranasal sinus disease and mastoid effusions in  the setting of life-support tubes.      ASSESSMENT AND PLAN: 60 year old male status post cardiorespiratory arrest.  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 -Management of rest of comorbidities per primary team  Thank you for allowing [place] to participate in the care of this patient. Neurology will sign off. Please call us for any further questions.  I have spent a total of25 minuteswith the patient reviewing hospitalnotes,  test results, labs and examining the patient as well as establishing  an assessment and plan that was discussed personally with NP Eliseo Gum and RN.>50% of time was spent in direct patient care.  Zeb [facility] Ep[facility] For questions after 5pm please refer to AMION to reach the Neurologist on call

## 2021-04-08 DIAGNOSIS — I469 Cardiac arrest, cause unspecified: Secondary | ICD-10-CM | POA: Diagnosis not present

## 2021-04-08 DIAGNOSIS — Z66 Do not resuscitate: Secondary | ICD-10-CM | POA: Diagnosis not present

## 2021-04-08 DIAGNOSIS — Z515 Encounter for palliative care: Secondary | ICD-10-CM

## 2021-04-08 DIAGNOSIS — Z7189 Other specified counseling: Secondary | ICD-10-CM

## 2021-04-08 LAB — CBC
HCT: 28.4 % — ABNORMAL LOW (ref 39.0–52.0)
Hemoglobin: 9 g/dL — ABNORMAL LOW (ref 13.0–17.0)
MCH: 23.1 pg — ABNORMAL LOW (ref 26.0–34.0)
MCHC: 31.7 g/dL (ref 30.0–36.0)
MCV: 72.8 fL — ABNORMAL LOW (ref 80.0–100.0)
Platelets: 191 10*3/uL (ref 150–400)
RBC: 3.9 MIL/uL — ABNORMAL LOW (ref 4.22–5.81)
RDW: 23.4 % — ABNORMAL HIGH (ref 11.5–15.5)
WBC: 8.4 10*3/uL (ref 4.0–10.5)
nRBC: 0.2 % (ref 0.0–0.2)

## 2021-04-08 LAB — CULTURE, BLOOD (ROUTINE X 2)
Culture: NO GROWTH
Culture: NO GROWTH

## 2021-04-08 LAB — RENAL FUNCTION PANEL
Albumin: 2.3 g/dL — ABNORMAL LOW (ref 3.5–5.0)
Anion gap: 14 (ref 5–15)
BUN: 49 mg/dL — ABNORMAL HIGH (ref 8–23)
CO2: 26 mmol/L (ref 22–32)
Calcium: 8.1 mg/dL — ABNORMAL LOW (ref 8.9–10.3)
Chloride: 95 mmol/L — ABNORMAL LOW (ref 98–111)
Creatinine, Ser: 5.08 mg/dL — ABNORMAL HIGH (ref 0.61–1.24)
GFR, Estimated: 12 mL/min — ABNORMAL LOW (ref >60)
Glucose, Bld: 131 mg/dL — ABNORMAL HIGH (ref 70–99)
Phosphorus: 4.7 mg/dL — ABNORMAL HIGH (ref 2.5–4.6)
Potassium: 3.5 mmol/L (ref 3.5–5.1)
Sodium: 135 mmol/L (ref 135–145)

## 2021-04-08 LAB — GLUCOSE, CAPILLARY
Glucose-Capillary: 168 mg/dL — ABNORMAL HIGH (ref 70–99)
Glucose-Capillary: 139 mg/dL — ABNORMAL HIGH (ref 70–99)
Glucose-Capillary: 136 mg/dL — ABNORMAL HIGH (ref 70–99)

## 2021-04-08 MED ORDER — PANTOPRAZOLE SODIUM 40 MG PO PACK
40.0000 mg | PACK | Freq: Every day | ORAL | Status: DC
  Administered 2021-04-08 – 2021-04-10 (×3): 40 mg
  Filled 2021-04-08 (×4): qty 20

## 2021-04-08 MED ORDER — INSULIN ASPART 100 UNIT/ML IJ SOLN
0.0000 [IU] | INTRAMUSCULAR | Status: DC
  Administered 2021-04-08 – 2021-04-11 (×5): 1 [IU] via SUBCUTANEOUS

## 2021-04-08 NOTE — Progress Notes (Signed)
[facility] Progress Note  Subjective: seen in ICU. Not responsive.   Vitals:   04/08/21 0945 04/08/21 1000 04/08/21 1015 04/08/21 1030  BP: (!) 148/130 (!) 120/28 (!) 142/92 (!) 133/107  Pulse: 75 75 73 73  Resp: '16 17 16 16  ' Temp:      TempSrc:      SpO2: 100% 99% 100% 100%  Weight:      Height:        Exam:  on vent , not responsive  no jvd  throat ett in place  Chest cta bilat and lat  Cor reg no RG  Abd soft ntnd no ascites   Ext no LE edema   Bilat BKA and multiple finger amps R > L   Neuro on vent and sedated, not following commands   LUE AVF+bruit     OP HD: GO MWF   4h 47mn  425/500  73.5kg  2/2.5 bath  AVF 15g  Hep none  - mircera 150 q2 last 4/27, due 5/11  - venofer 50 q wk  - calcitriol 2.5 ug po tiw  - renvela 4 ac tid    5/02 CXR - patchy basilar infiltrates, mostly clear, no edema  Assessment/ Plan: 1. Cardiac arrest/ anoxic brain injury: asystolic arrest in setting of missed HD, resp distress, and aspiration PNA. ROSC achieved after about 162m CPR. +VT, intubated in ED.   2. AMS: post arrest had seizure like activity and w/u per neuro suggests severe anoxic brain injury. 3. Resp failure/ aspiration PNA: early CXR with large R infiltrate concerning for aspiration.  On IV unasyn, f/u CXR 5/02 showed sig clearing.  4. Seizure d/o: had not been taking seizure meds at home per pt's wife. Getting depakote and keppra here IV.  5.  ESRD:  Missed HD 4/29. HD here on MWF schedule, HD today in ICU.  6.  HTN/volume: BP stable. SP Cardene gtt. Norvasc and coreg thru NG tube. Wts up 3 L off last HD. Plan 2-3 L goal w/ HD today as tolerated.  Doubt wt's are accurate, looks euvolemic on exam post HD this am.  7.  Anemia: Hgb at goal. Next esa due 5/0/24 8.  Metabolic bone disease: Calcium elevated. NPO, calcitriol and renvela on hold.  9.  Nutrition: tube feeds 10. PVD: Patient with history of severe PVD, bilat BKA and multiple finger amputations.     Rob Knight [place] 04/08/2021, 11:11 AM     Recent Labs  Lab 04/07/21 0641 04/07/21 0906 04/08/21 0732  K 5.2*  --  3.5  BUN 50*  --  49*  CREATININE 5.35*  --  5.08*  CALCIUM 7.8*  --  8.1*  PHOS 6.1*  --  4.7*  HGB  --  9.7* 9.0*   Inpatient medications: . amLODipine  10 mg Per Tube Daily  . aspirin  81 mg Per Tube Daily  . B-complex with vitamin C  1 tablet Per Tube Daily  . carvedilol  12.5 mg Per Tube BID WC  . chlorhexidine gluconate (MEDLINE KIT)  15 mL Mouth Rinse BID  . Chlorhexidine Gluconate Cloth  6 each Topical Q0600  . docusate  100 mg Per Tube BID  . feeding supplement (PROSource TF)  45 mL Per Tube TID  . heparin injection (subcutaneous)  5,000 Units Subcutaneous Q8H  . insulin aspart  0-6 Units Subcutaneous Q4H  . mouth rinse  15 mL Mouth Rinse 10 times per day  . pantoprazole sodium  40 mg  Per Tube QHS  . polyethylene glycol  17 g Per Tube Daily   . feeding supplement (VITAL 1.5 CAL) 60 mL/hr at 04/07/21 1900  . lactated ringers Stopped (04/08/21 0523)  . levETIRAcetam Stopped (04/07/21 1627)  . valproate sodium 52.5 mL/hr at 04/08/21 0600   acetaminophen, artificial tears, docusate, ondansetron (ZOFRAN) IV, polyethylene glycol

## 2021-04-08 NOTE — Progress Notes (Signed)
[name], MRN:  [id number], DOB:  1961/10/15, LOS: 5 ADMISSION DATE:  05/01/2021, CONSULTATION DATE: 04/19/2021 REFERRING MD:  Veryl Speak, CHIEF COMPLAINT: Shortness of breath, respiratory arrest  History of Present Illness:  60 year old man with end-stage renal disease on dialysis, arterial disease status post bilateral BKA and multiple amputation of bilateral hands, was brought into the emergency department after he started feeling short of breath. Upon EMS note patient missed his hemodialysis on Friday, started with increasing shortness of breath so he called 911, in route he started having worsening hypoxia leading to respiratory arrest and asystole, CPR was initiated, [name]'s airway was placed, he was brought in the emergency department CPR was in progress.  Patient was intubated on arrival to ED, ROSC achieved in about 15 minutes, post ROSC patient was noted to have V. Tach Initial work-up showed lactic acidosis and bilateral pulmonary infiltrates consistent with aspiration pneumonia.  Head CT was done which showed no acute intracranial abnormalities.  PCCM consulted for admission  Pertinent  Medical History   Past Medical History:  Diagnosis Date  . DM type 2 (diabetes mellitus, type 2) ([place])   . ESRD (end stage renal disease) on dialysis ([place])   . HLD (hyperlipidemia)   . HTN (hypertension)   . PVD (peripheral vascular disease) ([place])     Significant Hospital Events: Including procedures, antibiotic start and stop dates in addition to other pertinent events   . 5/1 ETT . 5/2 evidence of myoclonus on EEG, on VPA with ongoing video EEG . 5/4 evidence of anoxic injury on MRI brain . 5/5 MRI results communicated to wife, encouraged to reflect on acceptable QOL  . 5/6 getting HD. Awaiting follow up with family re [place]   Interim History / Subjective:  MRI brain 5/4 with evidence of anoxia  NAEO for patient, remains comatose    Wife and friend at bedside. Discussed  neuro status with wife and goals of care (encouraged to consider QOL). I asked pts wife if she would like to see a small neurologic exam to which she said yes -- I showed her his apnea, lack of corneals, lack of response to pain, and lack of cough.   Objective   Blood pressure (!) 120/28, pulse 75, temperature 98.2 F (36.8 C), temperature source Axillary, resp. rate 17, height '6\' 3"'$  (1.905 m), weight 82.7 kg, SpO2 99 %.    Vent Mode: PRVC FiO2 (%):  [40 %] 40 % Set Rate:  [6 bmp-16 bmp] 6 bmp Vt Set:  [580 mL] 580 mL PEEP:  [5 cmH20] 5 cmH20 Plateau Pressure:  [15 cmH20-18 cmH20] 16 cmH20   Intake/Output Summary (Last 24 hours) at 04/08/2021 1014 Last data filed at 04/08/2021 0600 Gross per 24 hour  Intake 406.86 ml  Output 400 ml  Net 6.86 ml   Filed Weights   04/07/21 0200 04/08/21 0500 04/08/21 0645  Weight: 80.5 kg 82.7 kg 82.7 kg    Exam  General:  Chronically and critically ill appearing middle aged M, intubated reclined in bed  Neuro:  Off sedation, comatose. No corneals, no response to pain. Initiates breath HEENT:  NCAT pink mmm copious clear oral secretions  Cardiovascular: rrr s1s2   Lungs: Symmetrical chest expansion, diminished bibasilar sounds, no adventitious sounds, mechanically ventilated  Abdomen:  Soft nd + bowel sounds  Musculoskeletal:  Bilat BKA. R hand digit amputations. L hand digic atrophy. No acute deformity.  Skin:c/d/w calciphylaxis breakdown  Labs/imaging that I havepersonally reviewed  (right click  and "Reselect all SmartList Selections" daily)   MRI Brain 5/4 evidence of anoxic brain injury VAS upper extremity 5/4. R radial DVT, age indeterminate   BMP K 3.5 CBC wbc 8.4 hgb 9  Receiving HD 5/6   Resolved Hospital Problem list   Sinus bradycardia  Hypertensive emergency  Assessment & Plan:   Acute anoxic encephalopathy Seizure disorder -MRI 5/4 with evidence of anoxic injury due to cardiac arrest EEG initially with myoclonic seizures  every 2 to 3 minutes, ceased on 5 1 however EEG continues to show generalized epileptogenicity in the city and profound diffuse encephalopathy. Initial head CT negative P: -cont AEDs  -poor neurologic prognosis  -Cont GOC   Acute respiratory failure with hypoxia requiring intubation Aspiration PNA   P -Cont MV support -mental status precludes extubation efforts -VAP, pulm hygiene -5/6 last day of Unasyn course  PEA arrest (follwing respiratory arrest)  NSVT  Prolonged qtc Wife reports patient had missed dialysis and was not taking his home Keppra, suspect he seized and had an aspiration event leading to hypoxic respiratory failure.  Echo showed EF 50 to 55% with grade 1 diastolic failure P -Supportive care -ICU monitoring  -doubt there will be utility for a cath given neuro prognosis  -off amio   ESRD on HD Hypocalcemia -HD per nephro  -trend renal indices   HTN  Initially in hypertensive emergency and started on Cardene, blood pressure predictably dropped after high-dose propofol initiated P: -Coreg, norvasc -holding home hydral  Dm2 Glucose 80s, home Lantus held P: -vsSSI  Age indeterminate R radial DVT - also with indeterminate age superficial venous thrombosis R cephalic  P -d/w ccm physician 5/5-- enteral ASA. Ongoing GOC in setting of anoxic injury above  -eliquis for dvt had been dc   GOC -5/5 CCM spoke with wife re anoxic injury and goals of care. pts wife seemed very caught off guard with some of the pts critical illness, and the prognosis of this unfortunate neurologic insult due to cardiac arrest -Will consult PCM 5/6    Best practice (right click and "Reselect all SmartList Selections" daily)  Diet:  Tube Feed  Pain/Anxiety/Delirium protocol (if indicated): No VAP protocol (if indicated): Yes DVT prophylaxis: Subcutaneous Heparin GI prophylaxis: PPI Glucose control:  SSI Yes Central venous access:  N/A Arterial line:  N/A Foley:  N/A Mobility:   bed rest  PT consulted: N/A Last date of multidisciplinary goals of care discussion: CCM NP discussed Anoxic injury and current critical illness course with wife 5/5 at bedside. [place]en[place] RN present. Encouraged to consider acceptable QOL and GOC-- briefly spoke of differences between aggressive interventions vs comfort apprpoach. Wife plans to discuss with family Code Status:  full code Disposition: ICU    CRITICAL CARE Performed by: Cristal Generous   Total critical care time: 36 minutes  Critical care time was exclusive of separately billable procedures and treating other patients. Critical care was necessary to treat or prevent imminent or life-threatening deterioration.  Critical care was time spent personally by me on the following activities: development of treatment plan with patient and/or surrogate as well as nursing, discussions with consultants, evaluation of patient's response to treatment, examination of patient, obtaining history from patient or surrogate, ordering and performing treatments and interventions, ordering and review of laboratory studies, ordering and review of radiographic studies, pulse oximetry and re-evaluation of patient's condition.  Eliseo Gum MSN, AGACNP-BC [place] for pager details  04/08/2021, 10:14 AM

## 2021-04-08 NOTE — Consult Note (Signed)
Palliative Medicine Inpatient Consult Note  Reason for consult:   "anoxic injury 2/2 cardiac arrest. 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 [place] talks started with CCM 5/5, but think PCM may be really helpful for family"  HPI:  Per intake H&P -->60 year old man with end-stage renal disease on dialysis, arterial disease status post bilateral BKA and multiple amputation of bilateral hands, was brought into the emergency department after he started feeling short of breath.Upon EMS note patient missed his hemodialysis on Friday, started with increasing shortness of breath so he called 911, in route he started having worsening hypoxia leading to respiratory arrest and asystole, CPR was initiated, [name]'s airway was placed, he was brought in the emergency department CPR was in progress. Patient was intubated on arrival to ED, ROSC achieved in about 15 minutes, post ROSC patient was noted to have V. Tach. Initial work-up showed lactic acidosis and bilateral pulmonary infiltrates consistent with aspiration pneumonia.  Head CT was done which showed no acute intracranial abnormalities.  PCCM consulted for admission  Palliative care was asked to get involved to further aid in goals of care conversations in the setting of [place]'s poor prognosis for recovery.  Clinical Assessment/Goals of Care:  *Please note that this is a verbal dictation therefore any spelling or grammatical errors are due to the "[place]" system interpretation.  I have reviewed medical records including EPIC notes, labs and imaging, received report from bedside RN, assessed the patient who was lying in bed unresponsive not on sedation.   I called patient's wife, Neoma Laming to further discuss diagnosis prognosis, GOC, EOL wishes, disposition and options.   I introduced Palliative Medicine as specialized medical care for people living with serious illness. It focuses on providing relief from  the symptoms and stress of a serious illness. The goal is to improve quality of life for both the patient and the family.  Ignace is from [place] in [place].  He is married.  Spouse, Neoma Laming is a [place] native.  He moved to [facility] as a result of his wife and to "slow down".  He is a recovering drug addict.  Kashif does not share any children with Neoma Laming though they each have children from prior relationships.  Aarian himself has 2 daughters and one son.  Neoma Laming has 4 daughters 15 grandchildren.  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  Candy did consider himself to be "spiritual".  Prior to hospitalization Frantz had been living at home with his wife, Neoma Laming. He was WC dependent and his wife assisted him with all basic activities of daily living.  Neoma Laming shares with me that she understands doing has endured tremendous insults to multiple organ systems including his heart, kidneys, lungs, and blood vessels in the setting of his peripheral vascular disease.  She shares with me that he has been "suffering for a long time".  She expresses to me that from her perspective she would wish for him to live as long as possible though she knows that would not be fair to him.  Neoma Laming does recognize and confirm with the medical team has shared with her.  Neoma Laming states that "[place] died on my porch".  Neoma Laming reviews that all the measures we are instituting now are due to modern medicine and that he is no longer "in there".  [name] test of the reality that she realizes her husband is no longer with [place] though there  are a couple things she would like to put in place before she pursues full comfort oriented care.  Neoma Laming and I talked about a variety of social factors one of which is the reality that Rafi was an inspiration to a multitude of former addicts in the community who he was a sponsor to.  Neoma Laming would like for them to get to spend time with him.  In addition  Neoma Laming has shared with me that there were quite a few funeral arrangements she would like to solidify.  We reviewed the course when we do pursue comfort oriented care which for patient such as doing is often short.  I shared with her that I doubt he would be able to leave the hospital as she had made mention of hospice homes.  We reviewed that first and foremost when we pursue comfort measure would like to verify that patients have stability of symptoms before we pursue any additional transitions.  A detailed discussion was had today regarding advanced directives.  Concepts specific to code status, artifical feeding and hydration, continued IV antibiotics and rehospitalization was had.  Neoma Laming is in favor of DO NOT RESUSCITATE.  She would like to continue the other interventions for the time being and on Wednesday will likely pursue compassionate extubation.  The difference between a aggressive medical intervention path  and a palliative comfort care path for this patient at this time was had.  Review of comfort care was had. We talked about transition to comfort measures in house and what that would entail inclusive of medications to control pain, dyspnea, agitation, nausea, itching, and hiccups.  We discussed stopping all uneccessary measures such as frequent monitoring, endotracheal tube, ventilation, nasogastric tube feeding, blood draws, needle sticks, and frequent vital signs.   Neoma Laming points in our conversation was overwhelmed with emotion.  Provided emotional support through therapeutic listening throughout our time together.   Discussed the importance of continued conversation with family and their  medical providers regarding overall plan of care and treatment options, ensuring decisions are within the context of the patients values and GOCs.  Decision Maker: Romero Belling ([phone]  SUMMARY OF RECOMMENDATIONS   DNAR  Continue present measures  Patient's wife, Neoma Laming  plans to pursue compassionate extubation midweek  Ongoing incremental palliative care support  Code Status/Advance Care Planning: DNAR    Palliative Prophylaxis:   Oral Care, turn Q2H  Additional Recommendations (Limitations, Scope, Preferences):  Continue current course of treatment  Psycho-social/Spiritual:   Desire for further Chaplaincy support:   Additional Recommendations:  Education postcardiac arrest and poor likelihood of meaningful recovery   Prognosis:  Prognosis is dismal in the setting of multisystem organ failure   Discharge Planning:  Discharge will be [facility]   Vitals:   04/08/21 1100 04/08/21 1105  BP: (!) 117/40 136/74  Pulse: 74 73  Resp: (!) 0 16  Temp: (!) 97.5 F (36.4 C)   SpO2: 100% 100%    Intake/Output Summary (Last 24 hours) at 04/08/2021 1218 Last data filed at 04/08/2021 1100 Gross per 24 hour  Intake 386.91 ml  Output 3389 ml  Net -3002.09 ml   Last Weight  Most recent update: 04/08/2021 11:18 AM   Weight  80.7 kg (177 lb 14.6 oz)           Gen: Very ill appearing AA M HEENT: ETT, OGT, dry mucous membranes CV: Regular rate and rhythm  PULM: Ventilator ABD: soft/nontender  EXT: BUE edema, BLE BKA's, multiple amputations of digits Neuro:  Unresponsive  PPS: 10%   This conversation/these recommendations were discussed with patient primary care team, Dr. Lynetta Mare  Time In: 1300 Time Out: 1410 Total Time: 39 Greater than 50%  of this time was spent counseling and coordinating care related to the above assessment and plan.  [place] Team Team Cell Phone: [phone] Please utilize secure chat with additional questions, if there is no response within 30 minutes please call the above phone number  Palliative Medicine Team providers are available by phone from 7am to 7pm daily and can be reached through the team cell phone.  Should this patient require assistance outside of these hours,  please call the patient's attending physician.

## 2021-04-09 DIAGNOSIS — L8990 Pressure ulcer of unspecified site, unspecified stage: Secondary | ICD-10-CM | POA: Insufficient documentation

## 2021-04-09 DIAGNOSIS — I469 Cardiac arrest, cause unspecified: Secondary | ICD-10-CM | POA: Diagnosis not present

## 2021-04-09 LAB — GLUCOSE, CAPILLARY
Glucose-Capillary: 143 mg/dL — ABNORMAL HIGH (ref 70–99)
Glucose-Capillary: 144 mg/dL — ABNORMAL HIGH (ref 70–99)
Glucose-Capillary: 150 mg/dL — ABNORMAL HIGH (ref 70–99)
Glucose-Capillary: 121 mg/dL — ABNORMAL HIGH (ref 70–99)
Glucose-Capillary: 129 mg/dL — ABNORMAL HIGH (ref 70–99)
Glucose-Capillary: 145 mg/dL — ABNORMAL HIGH (ref 70–99)
Glucose-Capillary: 159 mg/dL — ABNORMAL HIGH (ref 70–99)

## 2021-04-09 NOTE — Progress Notes (Signed)
[facility] Progress Note  Subjective: seen in ICU. Not responsive.   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 Cardiac arrest/ anoxic brain injury: asystolic arrest in setting of missed HD, resp distress, and aspiration PNA. ROSC achieved after about 15min CPR. +VT, intubated in ED.   2. AMS: post arrest had seizure like activity and w/u per neuro suggests severe anoxic brain injury. 3. Resp failure/ aspiration PNA: early CXR with large R infiltrate concerning for aspiration.  On IV unasyn, f/u CXR 5/02 showed sig clearing.  4. Seizure d/o: had not been taking seizure meds at home per pt's wife. Getting IV depakote and keppra here.  5.  ESRD:  Missed HD 4/29. HD here on MWF schedule. Had HD yesterday. Next HD Monday.  6.  HTN/volume: BP stable. SP Cardene gtt, now getting norvasc and coreg thru NG tube. Wt's and BP's coming down w/ large vol UF on HD. Only 4kg up now.  7.  Anemia: Hgb at goal. Next esa due 5/11.  8.  Metabolic bone disease: Calcium elevated. NPO, calcitriol and renvela on hold.  9.  Nutrition: tube feeds 10. PVD: Patient with history of severe PVD, bilat BKA and multiple finger amputations.    Rob  04/09/2021, 8:54  AM     Recent Labs  Lab 04/07/21 0641 04/07/21 0906 04/08/21 0732  K 5.2*  --  3.5  BUN 50*  --  49*  CREATININE 5.35*  --  5.08*  CALCIUM 7.8*  --  8.1*  PHOS 6.1*  --  4.7*  HGB  --  9.7* 9.0*   Inpatient medications: . amLODipine  10 mg Per Tube Daily  . aspirin  81 mg Per Tube Daily  . B-complex with vitamin C  1 tablet Per Tube Daily  . carvedilol  12.5 mg Per Tube BID WC  . chlorhexidine gluconate (MEDLINE KIT)  15 mL Mouth Rinse BID  . Chlorhexidine Gluconate Cloth  6 each Topical Q0600  . docusate  100 mg Per Tube BID  . feeding supplement (PROSource TF)  45 mL Per Tube TID  . heparin injection (subcutaneous)  5,000 Units Subcutaneous Q8H  . insulin aspart  0-6 Units Subcutaneous Q4H  . mouth rinse  15 mL Mouth Rinse 10 times per day  . pantoprazole sodium  40 mg Per Tube QHS  . polyethylene glycol    17 g Per Tube Daily   . feeding supplement (VITAL 1.5 CAL) 60 mL/hr at 04/09/21 0200  . lactated ringers 10 mL/hr at 04/09/21 0600  . levETIRAcetam Stopped (04/08/21 1613)  . valproate sodium 250 mg (04/09/21 0608)   acetaminophen, artificial tears, docusate, ondansetron (ZOFRAN) IV, polyethylene glycol       

## 2021-04-09 NOTE — Plan of Care (Signed)
Pt unable to participate in care due to mental status and intubation.  Remains w/ poor neuro status, fixed pupils, no gag reflex, no response to pain.  Sporadic mouth twitching observed at times.  Sats appropriately on 40% Fi02.  Copious oral secretions.  PIV x2 patent.     Problem: Education: Goal: Knowledge of General Education information will improve Description: Including pain rating scale, medication(s)/side effects and non-pharmacologic comfort measures Outcome: Progressing

## 2021-04-09 NOTE — Progress Notes (Signed)
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 Upon EMS note patient missed his hemodialysis on Friday, started with increasing shortness of breath so he called 911, in route he started having worsening hypoxia leading to respiratory arrest and asystole, CPR was initiated, [name]'s airway was placed, he was brought in the emergency department CPR was in progress.  Patient was intubated on arrival to ED, ROSC achieved in about 15 minutes, post ROSC patient was noted to have V. Tach Initial work-up showed lactic acidosis and bilateral pulmonary infiltrates consistent with aspiration pneumonia.  Head CT was done which showed no acute intracranial abnormalities.  PCCM consulted for admission  Pertinent  Medical History   Past Medical History:  Diagnosis Date  . DM type 2 (diabetes mellitus, type 2) ([place])   . ESRD (end stage renal disease) on dialysis ([place])   . HLD (hyperlipidemia)   . HTN (hypertension)   . PVD (peripheral vascular disease) ([place])     Significant Hospital Events: Including procedures, antibiotic start and stop dates in addition to other pertinent events   . 5/1 ETT . 5/2 evidence of myoclonus on EEG, on VPA with ongoing video EEG . 5/4 evidence of anoxic injury on MRI brain . 5/5 MRI results communicated to wife, encouraged to reflect on acceptable QOL  . 5/6 getting HD. Awaiting follow up with family re [place]   Interim History / Subjective:  No change in status.  Still planning on compassion extubation Monday or Tuesday  Objective   Blood pressure (!) 148/40,  pulse 77, temperature 99.7 F (37.6 C), resp. rate 15, height '6\' 3"'$  (1.905 m), weight 77.5 kg, SpO2 100 %.    Vent Mode: PRVC FiO2 (%):  [40 %] 40 % Set Rate:  [16 bmp] 16 bmp Vt Set:  [580 mL] 580 mL PEEP:  [5 cmH20] 5 cmH20 Plateau Pressure:  [16 cmH20-20 cmH20] 16 cmH20   Intake/Output Summary (Last 24 hours) at 04/09/2021 1722 Last data filed at 04/09/2021 0600 Gross per 24 hour  Intake 739.04 ml  Output 0 ml  Net 739.04 ml   Filed Weights   04/08/21 0645 04/08/21 1100 04/09/21 0500  Weight: 82.7 kg 80.7 kg 77.5 kg    Exam  General:  Chronically and critically ill appearing middle aged M, intubated reclined in bed  Neuro:  Off sedation, comatose. No corneals, no response to pain. Initiates breath HEENT:  NCAT pink mmm copious clear oral secretions  Cardiovascular: rrr s1s2   Lungs: Symmetrical chest expansion, diminished bibasilar sounds, no adventitious sounds, mechanically ventilated  Abdomen:  Soft nd + bowel sounds  Musculoskeletal:  Bilat BKA. R hand digit amputations. L hand digic atrophy. No acute deformity.  Skin:c/d/w calciphylaxis breakdown  Labs/imaging that I havepersonally reviewed  (right click and "Reselect all SmartList Selections" daily)   MRI Brain 5/4 evidence of anoxic brain injury VAS upper extremity 5/4. R radial DVT, age indeterminate   BMP K 3.5 CBC wbc 8.4 hgb 9  Receiving HD 5/6   Resolved Hospital Problem list   Sinus bradycardia  Hypertensive emergency  Assessment & Plan:   Acute anoxic encephalopathy Seizure disorder -  MRI 5/4 with evidence of anoxic injury due to cardiac arrest Acute respiratory failure with hypoxia requiring intubation Aspiration PNA  PEA arrest (follwing respiratory arrest)  NSVT  Prolonged qtc  ESRD on HD Hypocalcemia  HTN Dm2 Age indeterminate R radial DVT  Plan:  -Continue full ventilatory support and other supportive measures pending planned transition to comfort care Monday Tuesday of this coming  week.  Best practice (right click and "Reselect all SmartList Selections" daily)  Diet:  Tube Feed  Pain/Anxiety/Delirium protocol (if indicated): No VAP protocol (if indicated): Yes DVT prophylaxis: Subcutaneous Heparin GI prophylaxis: PPI Glucose control:  SSI Yes Central venous access:  N/A Arterial line:  N/A Foley:  N/A Mobility:  bed rest  PT consulted: N/A Last date of multidisciplinary goals of care discussion: CCM NP discussed Anoxic injury and current critical illness course with wife 5/5 at bedside. [place] RN present. Encouraged to consider acceptable QOL and GOC-- briefly spoke of differences between aggressive interventions vs comfort apprpoach. Wife plans to discuss with family Code Status:  full code Disposition: ICU  Kipp Brood, MD [facility] ICU Physician [place]  Pager: [phone] Or Epic Secure Chat After hours: [phone].  04/09/2021, 5:24 PM         04/09/2021, 5:22 PM

## 2021-04-09 NOTE — Progress Notes (Addendum)
   Palliative Medicine Inpatient Follow Up Note   Called patients wife, Neoma Laming twice this afternoon to offer ongoing support. Her voicemail  box was full therefore we have not been able to touch base with her today.   I will continue to attempt to communicate with her in the oncoming days.  No Charge _____________________________________________________________________________________ Addendum:  I spoke to patients wife, Neoma Laming. She plans to come in for compassionate extubation on Monday or Tuesday. She is unable to commit to a time today.   We reviewed bereavement resources in the community in anticipation of her giref. I will provide some handouts at bedside.  No Charge ______________________________________________________________________________________ [place] Team Team Cell Phone: [phone] Please utilize secure chat with additional questions, if there is no response within 30 minutes please call the above phone number  Palliative Medicine Team providers are available by phone from 7am to 7pm daily and can be reached through the team cell phone.  Should this patient require assistance outside of these hours, please call the patient's attending physician.

## 2021-04-10 DIAGNOSIS — I469 Cardiac arrest, cause unspecified: Secondary | ICD-10-CM | POA: Diagnosis not present

## 2021-04-10 LAB — GLUCOSE, CAPILLARY
Glucose-Capillary: 127 mg/dL — ABNORMAL HIGH (ref 70–99)
Glucose-Capillary: 158 mg/dL — ABNORMAL HIGH (ref 70–99)
Glucose-Capillary: 124 mg/dL — ABNORMAL HIGH (ref 70–99)
Glucose-Capillary: 159 mg/dL — ABNORMAL HIGH (ref 70–99)
Glucose-Capillary: 126 mg/dL — ABNORMAL HIGH (ref 70–99)

## 2021-04-10 MED ORDER — HYDROMORPHONE BOLUS VIA INFUSION
0.5000 mg | INTRAVENOUS | Status: DC | PRN
  Filled 2021-04-10: qty 1

## 2021-04-10 MED ORDER — SODIUM CHLORIDE 0.9 % IV SOLN
1.0000 mg/h | INTRAVENOUS | Status: DC
  Administered 2021-04-10: 0.5 mg/h via INTRAVENOUS
  Administered 2021-04-11: 4 mg/h via INTRAVENOUS
  Administered 2021-04-11: 2 mg/h via INTRAVENOUS
  Filled 2021-04-10 (×3): qty 2.5

## 2021-04-10 NOTE — Progress Notes (Signed)
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 Upon EMS note patient missed his hemodialysis on Friday, started with increasing shortness of breath so he called 911, in route he started having worsening hypoxia leading to respiratory arrest and asystole, CPR was initiated, [name]'s airway was placed, he was brought in the emergency department CPR was in progress.  Patient was intubated on arrival to ED, ROSC achieved in about 15 minutes, post ROSC patient was noted to have V. Tach Initial work-up showed lactic acidosis and bilateral pulmonary infiltrates consistent with aspiration pneumonia.  Head CT was done which showed no acute intracranial abnormalities.  PCCM consulted for admission  Pertinent  Medical History   Past Medical History:  Diagnosis Date  . DM type 2 (diabetes mellitus, type 2) ([place])   . ESRD (end stage renal disease) on dialysis ([place])   . HLD (hyperlipidemia)   . HTN (hypertension)   . PVD (peripheral vascular disease) ([place])     Significant Hospital Events: Including procedures, antibiotic start and stop dates in addition to other pertinent events   . 5/1 ETT . 5/2 evidence of myoclonus on EEG, on VPA with ongoing video EEG . 5/4 evidence of anoxic injury on MRI brain . 5/5 MRI results communicated to wife, encouraged to reflect on acceptable QOL  . 5/6 getting HD. Awaiting follow up with family re [place]   Interim History / Subjective:  More spontaneous eye opening today.  Still planning on compassion extubation Monday or Tuesday  Objective   Blood  pressure (!) 146/45, pulse 69, temperature 98.5 F (36.9 C), resp. rate 16, height '6\' 3"'$  (1.905 m), weight 77.5 kg, SpO2 100 %.    Vent Mode: PRVC FiO2 (%):  [30 %-40 %] 30 % Set Rate:  [16 bmp] 16 bmp Vt Set:  [580 mL] 580 mL PEEP:  [5 cmH20] 5 cmH20 Plateau Pressure:  [13 cmH20-16 cmH20] 13 cmH20   Intake/Output Summary (Last 24 hours) at 04/10/2021 1838 Last data filed at 04/10/2021 0600 Gross per 24 hour  Intake 882.56 ml  Output 125 ml  Net 757.56 ml   Filed Weights   04/08/21 0645 04/08/21 1100 04/09/21 0500  Weight: 82.7 kg 80.7 kg 77.5 kg    Exam  General:  Chronically and critically ill appearing middle aged M, intubated reclined in bed  Neuro:  Off sedation, in a minimally responsive state HEENT:  NCAT pink mmm copious clear oral secretions  Cardiovascular: rrr s1s2   Lungs: Symmetrical chest expansion, diminished bibasilar sounds, no adventitious sounds, mechanically ventilated  Abdomen:  Soft nd + bowel sounds  Musculoskeletal:  Bilat BKA. R hand digit amputations. L hand digic atrophy. No acute deformity.  Skin:c/d/w calciphylaxis breakdown  Labs/imaging that I havepersonally reviewed  (right click and "Reselect all SmartList Selections" daily)   MRI Brain 5/4 evidence of anoxic brain injury VAS upper extremity 5/4. R radial DVT, age indeterminate   BMP K 3.5 CBC wbc 8.4 hgb 9  Receiving HD 5/6   Resolved Hospital Problem list   Sinus bradycardia  Hypertensive emergency  Assessment & Plan:   Acute anoxic encephalopathy - now in minimally  responsive state Seizure disorder -MRI 5/4 with evidence of anoxic injury due to cardiac arrest Acute respiratory failure with hypoxia requiring intubation Aspiration PNA  PEA arrest (follwing respiratory arrest)  NSVT  Prolonged qtc  ESRD on HD Hypocalcemia  HTN Dm2 Age indeterminate R radial DVT  Plan:  -Continue full ventilatory support and other supportive measures pending planned transition to comfort care  Monday Tuesday of this coming week. - Minimally responsive state represents natural evolution of HIE following cardiac arrest and doesn't portend improved long term prognosis.   Best practice (right click and "Reselect all SmartList Selections" daily)  Diet:  Tube Feed  Pain/Anxiety/Delirium protocol (if indicated): No VAP protocol (if indicated): Yes DVT prophylaxis: Subcutaneous Heparin GI prophylaxis: PPI Glucose control:  SSI Yes Central venous access:  N/A Arterial line:  N/A Foley:  N/A Mobility:  bed rest  PT consulted: N/A Last date of multidisciplinary goals of care discussion: CCM NP discussed Anoxic injury and current critical illness course with wife 5/5 at bedside. Plan for transition to comfort care tomorrow. Code Status:  full code Disposition: ICU  Kipp Brood, MD [facility] ICU Physician [place]  Pager: [phone] Or Epic Secure Chat After hours: [phone].  04/10/2021, 6:38 PM

## 2021-04-10 NOTE — Progress Notes (Signed)
[facility] Progress Note  Subjective: seen in ICU. Not responsive.   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 Cardiac arrest/ anoxic brain injury: asystolic arrest in setting of missed HD, resp distress, and aspiration PNA. ROSC achieved after about 172m CPR. +VT, intubated in ED.   2. AMS: post arrest had seizure like activity and w/u per neuro suggests severe anoxic brain injury. Palliative care/ CCM working w/ family. Tentative plan is for transition to comfort care early this week.  3. Resp failure/ aspiration PNA: early CXR with large R infiltrate c/w asp pna.  On IV unasyn. F/u CXR 5/02 showed clearing of infiltrates.  4. Seizure d/o: had not been taking seizure meds at home per pt's wife. Getting IV depakote and keppra here.  5.  ESRD:  Missed HD 4/29. HD here on MWF schedule. Had HD yesterday. Next HD Monday.  6.  HTN/volume: BP stable. SP Cardene gtt, now getting norvasc and coreg thru NG tube. Wt's and BP's coming down w/ large vol UF on HD.  7.  Anemia: Hgb at goal. Next esa due 04/07/98 8.  Metabolic bone disease: Calcium elevated. NPO, calcitriol and renvela on hold.  9.  Nutrition: tube feeds 10. PVD: Patient with history of severe PVD, bilat BKA and  multiple finger amputations.    Rob Vincient Vanaman 04/10/2021, 8:26 AM     Recent Labs  Lab 04/07/21 0641 04/07/21 0906 04/08/21 0732  K 5.2*  --  3.5  BUN 50*  --  49*  CREATININE 5.35*  --  5.08*  CALCIUM 7.8*  --  8.1*  PHOS 6.1*  --  4.7*  HGB  --  9.7* 9.0*   Inpatient medications: . amLODipine  10 mg Per Tube Daily  . aspirin  81 mg Per Tube Daily  . B-complex with vitamin C  1 tablet Per Tube Daily  . carvedilol  12.5 mg Per Tube BID WC  . chlorhexidine gluconate (MEDLINE KIT)  15 mL Mouth Rinse BID  . Chlorhexidine Gluconate Cloth  6 each Topical Q0600  . docusate  100 mg Per Tube BID  . feeding supplement (PROSource TF)  45 mL Per Tube TID  . heparin injection (subcutaneous)  5,000 Units Subcutaneous Q8H  . insulin aspart  0-6 Units Subcutaneous Q4H  . mouth rinse  15 mL Mouth Rinse 10 times per day  .  pantoprazole sodium  40 mg Per Tube QHS  . polyethylene glycol  17 g Per Tube Daily   . feeding supplement (VITAL 1.5 CAL) 60 mL/hr at 04/10/21 0600  . lactated ringers 10 mL/hr at 04/10/21 0600  . levETIRAcetam Stopped (04/09/21 1613)  . valproate sodium 250 mg (04/10/21 2182)   acetaminophen, artificial tears, docusate, ondansetron (ZOFRAN) IV, polyethylene glycol

## 2021-04-10 NOTE — Progress Notes (Signed)
\     Palliative Medicine Inpatient Follow Up Note   Dropped off bereavement resources and "Gone from my Sight" booklet. Informed RN that these would be left at bedside.   No Charge ______________________________________________________________________________________ [place] Team Team Cell Phone: [phone] Please utilize secure chat with additional questions, if there is no response within 30 minutes please call the above phone number  Palliative Medicine Team providers are available by phone from 7am to 7pm daily and can be reached through the team cell phone.  Should this patient require assistance outside of these hours, please call the patient's attending physician.

## 2021-04-11 DIAGNOSIS — N186 End stage renal disease: Secondary | ICD-10-CM | POA: Diagnosis not present

## 2021-04-11 DIAGNOSIS — I469 Cardiac arrest, cause unspecified: Secondary | ICD-10-CM | POA: Diagnosis not present

## 2021-04-11 DIAGNOSIS — J9601 Acute respiratory failure with hypoxia: Secondary | ICD-10-CM | POA: Diagnosis not present

## 2021-04-11 DIAGNOSIS — G931 Anoxic brain damage, not elsewhere classified: Secondary | ICD-10-CM | POA: Diagnosis not present

## 2021-04-11 DIAGNOSIS — Z992 Dependence on renal dialysis: Secondary | ICD-10-CM | POA: Diagnosis not present

## 2021-04-11 DIAGNOSIS — Z515 Encounter for palliative care: Secondary | ICD-10-CM | POA: Diagnosis not present

## 2021-04-11 LAB — GLUCOSE, CAPILLARY
Glucose-Capillary: 135 mg/dL — ABNORMAL HIGH (ref 70–99)
Glucose-Capillary: 139 mg/dL — ABNORMAL HIGH (ref 70–99)
Glucose-Capillary: 148 mg/dL — ABNORMAL HIGH (ref 70–99)
Glucose-Capillary: 179 mg/dL — ABNORMAL HIGH (ref 70–99)

## 2021-04-11 MED ORDER — ONDANSETRON 4 MG PO TBDP
4.0000 mg | ORAL_TABLET | Freq: Four times a day (QID) | ORAL | Status: DC | PRN
  Filled 2021-04-11: qty 1

## 2021-04-11 MED ORDER — HALOPERIDOL LACTATE 5 MG/ML IJ SOLN: 0.5000 mg | INTRAMUSCULAR | Status: DC | PRN

## 2021-04-11 MED ORDER — GLYCOPYRROLATE 1 MG PO TABS: 1.0000 mg | ORAL_TABLET | ORAL | Status: DC | PRN

## 2021-04-11 MED ORDER — GLYCOPYRROLATE 0.2 MG/ML IJ SOLN: 0.2000 mg | INTRAMUSCULAR | Status: DC | PRN

## 2021-04-11 MED ORDER — GLYCOPYRROLATE 0.2 MG/ML IJ SOLN
0.2000 mg | INTRAMUSCULAR | Status: DC | PRN
  Administered 2021-04-11: 0.2 mg via INTRAVENOUS

## 2021-04-11 MED ORDER — POLYVINYL ALCOHOL 1.4 % OP SOLN: 1.0000 [drp] | Freq: Four times a day (QID) | OPHTHALMIC | Status: DC | PRN

## 2021-04-11 MED ORDER — LORAZEPAM 2 MG/ML IJ SOLN
1.0000 mg | INTRAMUSCULAR | Status: DC | PRN
  Administered 2021-04-11: 1 mg via INTRAVENOUS
  Filled 2021-04-11: qty 1

## 2021-04-11 MED ORDER — ACETAMINOPHEN 325 MG PO TABS: 650.0000 mg | ORAL_TABLET | Freq: Four times a day (QID) | ORAL | Status: DC | PRN

## 2021-04-11 MED ORDER — GLYCOPYRROLATE 0.2 MG/ML IJ SOLN
0.2000 mg | INTRAMUSCULAR | Status: DC | PRN
  Filled 2021-04-11: qty 1

## 2021-04-11 MED ORDER — HYDROMORPHONE HCL 1 MG/ML IJ SOLN
1.0000 mg | INTRAMUSCULAR | Status: DC | PRN
  Administered 2021-04-11: 2 mg via INTRAVENOUS
  Filled 2021-04-11: qty 2

## 2021-04-11 MED ORDER — ACETAMINOPHEN 650 MG RE SUPP: 650.0000 mg | Freq: Four times a day (QID) | RECTAL | Status: DC | PRN

## 2021-04-11 MED ORDER — BIOTENE DRY MOUTH MT LIQD: 15.0000 mL | OROMUCOSAL | Status: DC | PRN

## 2021-04-11 MED ORDER — GLYCOPYRROLATE 1 MG PO TABS
1.0000 mg | ORAL_TABLET | ORAL | Status: DC | PRN
  Filled 2021-04-11: qty 1

## 2021-04-11 MED ORDER — POLYVINYL ALCOHOL 1.4 % OP SOLN
1.0000 [drp] | Freq: Four times a day (QID) | OPHTHALMIC | Status: DC | PRN
  Filled 2021-04-11: qty 15

## 2021-04-11 MED ORDER — HYDROMORPHONE BOLUS VIA INFUSION
1.0000 mg | INTRAVENOUS | Status: DC | PRN
  Filled 2021-04-11: qty 1

## 2021-04-11 MED ORDER — ONDANSETRON HCL 4 MG/2ML IJ SOLN: 4.0000 mg | Freq: Four times a day (QID) | INTRAMUSCULAR | Status: DC | PRN

## 2021-04-11 MED ORDER — HALOPERIDOL 0.5 MG PO TABS
0.5000 mg | ORAL_TABLET | ORAL | Status: DC | PRN
  Filled 2021-04-11: qty 1

## 2021-04-11 MED ORDER — DIPHENHYDRAMINE HCL 50 MG/ML IJ SOLN: 25.0000 mg | INTRAMUSCULAR | Status: DC | PRN

## 2021-04-11 MED ORDER — DEXTROSE 5 % IV SOLN: INTRAVENOUS | Status: DC

## 2021-04-11 MED ORDER — GLYCOPYRROLATE 0.2 MG/ML IJ SOLN
0.2000 mg | INTRAMUSCULAR | Status: DC | PRN
Start: 2021-04-11 — End: 2021-04-12

## 2021-04-11 MED ORDER — LORAZEPAM 2 MG/ML IJ SOLN
1.0000 mg | Freq: Four times a day (QID) | INTRAMUSCULAR | Status: DC
  Filled 2021-04-11: qty 1

## 2021-04-11 MED ORDER — HALOPERIDOL LACTATE 2 MG/ML PO CONC
0.5000 mg | ORAL | Status: DC | PRN
  Filled 2021-04-11: qty 0.3

## 2021-04-12 ENCOUNTER — Encounter (HOSPITAL_COMMUNITY): Payer: Self-pay | Admitting: Orthopedic Surgery

## 2021-05-04 NOTE — Progress Notes (Signed)
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 Upon EMS note patient missed his hemodialysis on Friday, started with increasing shortness of breath so he called 911, in route he started having worsening hypoxia leading to respiratory arrest and asystole, CPR was initiated, [name]'s airway was placed, he was brought in the emergency department CPR was in progress.  Patient was intubated on arrival to ED, ROSC achieved in about 15 minutes, post ROSC patient was noted to have V. Tach Initial work-up showed lactic acidosis and bilateral pulmonary infiltrates consistent with aspiration pneumonia.  Head CT was done which showed no acute intracranial abnormalities.  PCCM consulted for admission  Pertinent  Medical History   Past Medical History:  Diagnosis Date  . DM type 2 (diabetes mellitus, type 2) ([place])   . ESRD (end stage renal disease) on dialysis ([place])   . HLD (hyperlipidemia)   . HTN (hypertension)   . PVD (peripheral vascular disease) ([place])     Significant Hospital Events: Including procedures, antibiotic start and stop dates in addition to other pertinent events   . 5/1 ETT . 5/2 evidence of myoclonus on EEG, on VPA with ongoing video EEG . 5/4 evidence of anoxic injury on MRI brain . 5/5 MRI results communicated to wife, encouraged to reflect on acceptable QOL  . 5/6 getting HD. Awaiting follow up with family re [place]   Interim History / Subjective:  On low dose dilaudid   PSV -- few spontaneous respirations. Back on PRVC  Objective   Blood pressure (!) 151/50, pulse  72, temperature 98.6 F (37 C), temperature source Oral, resp. rate 11, height '6\' 3"'$  (1.905 m), weight 79.4 kg, SpO2 98 %.    Vent Mode: PRVC FiO2 (%):  [30 %-40 %] 30 % Set Rate:  [16 bmp] 16 bmp Vt Set:  [580 mL] 580 mL PEEP:  [5 cmH20] 5 cmH20 Plateau Pressure:  [13 cmH20-17 cmH20] 17 cmH20   Intake/Output Summary (Last 24 hours) at 2021-04-19 0713 Last data filed at 2021-04-19 0600 Gross per 24 hour  Intake 1042.96 ml  Output 300 ml  Net 742.96 ml   Filed Weights   04/08/21 1100 04/09/21 0500 04/19/2021 0457  Weight: 80.7 kg 77.5 kg 79.4 kg    Exam  General:  Chronically and critically ill appearing adult M intubated supine in bed Neuro:  Lightly sedated Does not awaken or follow commands  HEENT:  NCAT pink mmm anicteric sclera ETT secure Cardiovascular: rrr s1s2 cap refill < 3 sec Lungs: Mechanically ventilated. Symmetrical chest expansion. No rhonchi  Abdomen:  Soft round nd  Musculoskeletal:  No acute deformity. Bilateral BKAs. 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 C/d/w   Labs/imaging that I havepersonally reviewed  (right click and "Reselect all SmartList Selections" daily)   MRI Brain 5/4 evidence of anoxic brain injury VAS upper extremity 5/4. R radial DVT, age indeterminate   Glucose 135-179   Resolved Hospital Problem list   Sinus bradycardia  Hypertensive emergency  Assessment & Plan:   [place] -Family plans to transition to comfort care (time TBD) -appreciate palliative care consult  Anoxic encephalopathy  -MRI with evidence of anoxic injury  S/p PEA arrest with ROSC Acute respiratory failure with hypoxia  Aspiration PNA  Seizure disorder ESRD on HD Hypocalcemia HTN Prolonged qtc NSVT  Age indeterminate R radial DVT DM2 P -Planning for transition to comfort care when family indicates  -supportive care in interim -- will dc all measures not aimed at comfort and symptom management when  transitions    Best practice (right click and "Reselect all SmartList Selections" daily)  Diet:  Tube Feed  Pain/Anxiety/Delirium protocol (if indicated): No VAP protocol (if indicated): Yes DVT prophylaxis: Subcutaneous Heparin GI prophylaxis: PPI Glucose control:  SSI Yes Central venous access:  N/A Arterial line:  N/A Foley:  N/A Mobility:  bed rest  PT consulted: N/A Last date of multidisciplinary goals of care discussion: CCM NP discussed Anoxic injury and current critical illness course with wife 5/5 at bedside.  Family plans to transition to comfort care 5/9 Code Status:  full code Disposition: ICU   CRITICAL CARE Performed by: Cristal Generous   Total critical care time: 32 minutes  Critical care time was exclusive of separately billable procedures and treating other patients. Critical care was necessary to treat or prevent imminent or life-threatening deterioration.  Critical care was time spent personally by me on the following activities: development of treatment plan with patient and/or surrogate as well as nursing, discussions with consultants, evaluation of patient's response to treatment, examination of patient, obtaining history from patient or surrogate, ordering and performing treatments and interventions, ordering and review of laboratory studies, ordering and review of radiographic studies, pulse oximetry and re-evaluation of patient's condition.  Eliseo Gum MSN, AGACNP-BC [place] [id number] If no answer, [id number] May 07, 2021, 7:13 AM

## 2021-05-04 NOTE — Progress Notes (Signed)
This chaplain responded to PMT referral for spiritual care and presence with the Pt. wife.  The chaplain read the chart notes and joined Raquel Sarna at the Pt. bedside.   Family was not present. The Pt. RN-Ashlyn updated Haynes Dage and I at the bedside.  The chaplain understands Palliative Care will update the chaplain on the best time for F/U spiritual care.

## 2021-05-04 NOTE — Progress Notes (Signed)
Patient was compassionately extubated at 67 per MD order with family at bedside.

## 2021-05-04 NOTE — Progress Notes (Signed)
[facility] Progress Note  Subjective: last HD on 5/6.  Per pulm charting plan is to transition to comfort care today.  Per RN plan he has been communicated is to transition to comfort today as well.  Per RN he has had shaking overnight and was improved with dilaudid gtt.   Review of systems:  Unable to obtain secondary to intubated  Vitals:   05-11-2021 0310 05-11-2021 0400 05-11-21 0457 May 11, 2021 0500  BP: (!) 136/51 (!) 121/34  (!) 140/46  Pulse: 76 75  77  Resp: 16 (!) 0  16  Temp:  98.6 F (37 C)    TempSrc:  Oral    SpO2: 99% 99%  100%  Weight:   79.4 kg   Height:        Exam:  adult male intubated  no jvd  throat ett in place  coarse mechanical breath sounds   S1S2 no rub  Abd soft nondistended    Ext no LE edema appreciated; Bilat BKA and multiple finger amputations   Neuro on vent and sedated, not following commands access  LUE AVF+bruit and thrill     OP HD: GO MWF   4h 30mn  425/500  73.5kg  2/2.5 bath  AVF 15g  Hep none  - mircera 150 q2 last 4/27, due 5/11  - venofer 50 q wk  - calcitriol 2.5 ug po tiw  - renvela 4 ac tid    5/02 CXR - patchy basilar infiltrates, mostly clear, no edema  Assessment/ Plan: 1. Cardiac arrest/ anoxic brain injury: asystolic arrest in setting of missed HD, resp distress, and aspiration PNA. ROSC achieved after about 160m CPR. +VT, intubated in ED.   2. AMS: post arrest had seizure like activity and w/u per neuro suggests severe anoxic brain injury. Palliative care/ CCM working with family.  Plan is to transition to comfort measures today, 5/9 3. Resp failure/ aspiration PNA: early CXR with large R infiltrate c/w asp pna.  On IV unasyn. F/u CXR 5/02 showed clearing of infiltrates.  4. Seizure d/o: had not been taking seizure meds at home per pt's wife. anti-epileptics per primary and neuro 5.  ESRD:  Missed HD 4/29.  Normally MWF outpatient.  We will defer dialysis today as he is transitioning to comfort measures  today - should that plan change please notify nephrology   6.  HTN/volume: controlled   7.  Anemia: Hgb 9.0 last check.  Defer esa - transitioning to comfort measures 8.  Metabolic bone disease: calcium and phos acceptable  9. PVD: Patient with history of severe PVD, bilat BKA and multiple finger amputations.     We will defer dialysis today as he is transitioning to comfort measures today - should that plan change please notify nephrology   Recent Labs  Lab 04/07/21 0641 04/07/21 0906 04/08/21 0732  K 5.2*  --  3.5  BUN 50*  --  49*  CREATININE 5.35*  --  5.08*  CALCIUM 7.8*  --  8.1*  PHOS 6.1*  --  4.7*  HGB  --  9.7* 9.0*   Inpatient medications: . amLODipine  10 mg Per Tube Daily  . aspirin  81 mg Per Tube Daily  . B-complex with vitamin C  1 tablet Per Tube Daily  . carvedilol  12.5 mg Per Tube BID WC  . chlorhexidine gluconate (MEDLINE KIT)  15 mL Mouth Rinse BID  . Chlorhexidine Gluconate Cloth  6 each Topical Q0600  . docusate  100  mg Per Tube BID  . feeding supplement (PROSource TF)  45 mL Per Tube TID  . heparin injection (subcutaneous)  5,000 Units Subcutaneous Q8H  . insulin aspart  0-6 Units Subcutaneous Q4H  . mouth rinse  15 mL Mouth Rinse 10 times per day  . pantoprazole sodium  40 mg Per Tube QHS  . polyethylene glycol  17 g Per Tube Daily   . feeding supplement (VITAL 1.5 CAL) 60 mL/hr at 04/10/21 2000  . HYDROmorphone 0.5 mg/hr (11-May-2021 0000)  . lactated ringers 10 mL/hr at 04/10/21 2000  . levETIRAcetam Stopped (04/10/21 1553)  . valproate sodium 250 mg (05/11/21 0531)   acetaminophen, artificial tears, docusate, HYDROmorphone, ondansetron (ZOFRAN) IV, polyethylene glycol   Claudia Desanctis, MD 05/11/21 6:00 AM

## 2021-05-04 NOTE — Progress Notes (Signed)
Dilauded gtt increased prior to comfort care extubation along with prn ativan and glycopyrilate. Chaplain, palliative care PA, RN, RT, wife, and friend at bedside for extubation. Dilauded gtt empty shortly after extubation, dilauded IVP given for comfort until new bag could be sent from pharmacy. Additional ativan IVP given for air hunger.   Family supported with music, snacks, therapeutic touch, ability to shave patient's facial hair per his wishes (according to wife), and space.   Pt maintaining HR 50-70 with Sp02 70's. Pt appears comfortable, back on dilauded gtt, and will give additional Ativan IVP as needed for air hunger.   Gustavia Carie RN

## 2021-05-04 NOTE — Progress Notes (Signed)
Daily Progress Note   Patient Name: [name]       Date: 27-Apr-2021 DOB: [birth year]  Age: 60 y.o. MRN#: [id number] Attending Physician: Kipp Brood, MD Primary Care Physician: [name], FNP Admit Date: 04/22/2021  Reason for Consultation/Follow-up: family support, symptom management  Examined patient and discussed with [name] and RN [name] patient and family situation.  Comfort orders placed in anticipation of extubation later today.  Subjective: Returned to bedside.  Wife and best friend have arrived and are prepared for extubation.  Chaplain [name] prayed and the family shared stories about [name].  His favorite up beat rap music played while he was extubated.  Patient was calm and peaceful during and after extubation.  Family is at peace.  Wife discussed how she was with him when he died on their back porch.  She comments that modern medicine brought him back and he will pass on again today.  She feels God is in control.  Assessment: Patient now extubated and appears comfortable.  Actively dying.  Oxygen saturations dropping rapidly.   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 Patient was intubated on arrival to ED, ROSC achieved in about 15  minutes, post ROSC patient was noted to have V. Tach. Initial work-up showed lactic acidosis and bilateral pulmonary infiltrates consistent with aspiration pneumonia. Head CT was done which showed no acute intracranial abnormalities. PCCM consulted for admission  Palliative care was asked to get involved to further aid in goals of care conversations in the setting of [name]'s poor prognosis for recovery.    Length of Stay: 8   Vital Signs: BP (!) 142/55   Pulse 67   Temp 99.1 F (37.3 C) (Oral)   Resp 15   Ht '6\' 3"'$  (1.905 m)   Wt 79.4 kg   SpO2 99%   BMI 21.88 kg/m  SpO2: SpO2: 99 % O2 Device: O2 Device: Ventilator O2 Flow Rate:         Palliative Assessment/Data: 10%     Palliative Care Plan    Recommendations/Plan:  PMT will continue to follow with you.  Patient has been expertly cared for by CCM NP and ICU RN  Will adjust comfort meds as needed.  Code Status:  DNR  Prognosis:   Hours - Days   Discharge Planning:  Anticipated Hospital Death  Care plan was discussed with family, and medical team  Thank you for allowing the Palliative Medicine Team to assist in the care of this patient.  Total time spent:  35 min.     Greater than 50%  of this time was spent counseling and coordinating care related to the above assessment and plan.  Florentina Jenny, PA-C Palliative Medicine  Please contact Palliative MedicineTeam phone at [phone] for questions and concerns between 7 am - 7 pm.   Please see AMION for individual provider pager numbers.

## 2021-05-04 NOTE — Progress Notes (Signed)
This chaplain is present with the Pt. and family before extubation. The chaplain joined the medical team and introduced herself to the the Pt. wife-[name] and friend at the bedside.     The chaplain prayed with the Pt. The prayer opened the space for God's love to be shared through storytelling in the room.  Neoma Laming is confident there will be no more suffering for the Pt. The chaplain learned Neoma Laming celebrates 20 years of marriage with the Pt. as she makes plans to shave the Pt. with the RN's assistance.   The chaplain stepped away as Neoma Laming was caring for the Pt.  The chaplain offered F/U spiritual care as needed.

## 2021-05-04 NOTE — Progress Notes (Signed)
Time of Death 21-Apr-2021 @ 1850 declared by this RN and Lydia Guiles., RN. MD notified. CDS notified. Wife not answering phone to notify. Belongings still at bedside. Unknown funeral home desire.   57m dilauded gtt wasted at pyxis narcotic receptacle with ALydia Guiles, RN  Dasan Hardman RN.

## 2021-05-04 NOTE — Death Summary Note (Signed)
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 year old male who suffered an unheralded cardiac arrest.  Presenting rhythm was ventricular tachycardia.  He did not regain consciousness after return of spontaneous circulation and developed myoclonic status which did improve with sedative infusions however the patient did not regain full consciousness and evolved to a minimally responsive state.  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  Situation was discussed with patient's family with palliative care's involvement and the wife opted to transition to comfort care given the patient's poor long-term neurological prognosis.  He was compassionately extubated passed away peacefully.    Pertinent Labs and Studies  Significant Diagnostic Studies CT HEAD WO CONTRAST  Result Date: 27-Apr-2021 CLINICAL DATA:  Anoxic brain damage. Cardiac arrest with 15 to 20 minutes of CPR. EXAM: CT HEAD WITHOUT CONTRAST TECHNIQUE: Contiguous axial images were obtained from the base of the skull  through the vertex without intravenous contrast. COMPARISON:  January 21, 2021 FINDINGS: Brain: No subdural, epidural, or subarachnoid hemorrhage identified. The ventricles and sulci are stable. 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 Cerebellum, brainstem, and basal cisterns are otherwise normal. No mass effect or midline shift. White matter changes are noted. The gray-white differentiation is identified. No acute cortical ischemia. Chronic tiny right frontal infarct. The sulci are not effaced. No mass effect or midline shift. Vascular: Calcified atherosclerosis is seen in the intracranial carotids. Skull: Normal. Negative for fracture or focal lesion. Sinuses/Orbits: Mild mucosal thickening in the maxillary sinuses. Mucosal thickening in the frontal sinuses and scattered ethmoid air cells. No air-fluid levels. Mastoid air cells and middle ears are well aerated. Other: An ET tube is identified. IMPRESSION: 1. No acute intracranial abnormality noted at this time. 2. Sinus disease as above.  An ETT is identified. Electronically Signed   By: Dorise Bullion III M.D   On: 27-Apr-2021 09:00   MR BRAIN WO CONTRAST  Result Date: 04/06/2021 CLINICAL DATA:  Anoxic brain damage. EXAM: MRI HEAD WITHOUT CONTRAST TECHNIQUE: Multiplanar, multiecho pulse sequences of the brain and surrounding structures were obtained without intravenous contrast. COMPARISON:  Prior head CT examinations 2021/04/27 and earlier. Brain MRI 01/22/2021. FINDINGS: Brain: Cerebral volume is normal for age. There is extensive cortically based restricted diffusion and T2/FLAIR hyperintense signal abnormality within the bilateral cerebral hemispheres. Diffusion-weighted signal abnormality is also present within the bilateral hippocampi, basal ganglia and thalami. Subtle diffusion-weighted signal abnormality is also questioned within the cerebellum. These findings are compatible with hypoxic/ischemic injury. Redemonstrated small focus  of chronic encephalomalacia within the anterior right frontal lobe. Mild multifocal T2/FLAIR hyperintensity within the  cerebral white matter, nonspecific but compatible with chronic small vessel ischemic disease. Redemonstrated small chronic infarcts within the left cerebellar hemisphere. No evidence of intracranial mass. No chronic intracranial blood products. No extra-axial fluid collection. No midline shift. Vascular: Redemonstrated signal abnormality within the V4 left vertebral artery suggestive of high-grade stenosis. There are otherwise expected proximal large arterial flow voids. Skull and upper cervical spine: No focal marrow lesion. Sinuses/Orbits: Visualized orbits show no acute finding. Extensive partial opacification of the bilateral ethmoid air cells. Fluid levels within the sphenoid sinuses and left maxillary sinus. Other: Large volume secretions within the visualized nasopharynx. Right greater than left mastoid effusions. IMPRESSION: Extensive diffusion-weighted and T2/FLAIR hyperintense signal changes within the brain, as described and compatible with hypoxic/ischemic injury. No significant mass effect at this time. Otherwise stable non-contrast MRI appearance of the brain as compared to 01/22/2021. Small focus of chronic encephalomalacia within the anterior right frontal lobe. Mild cerebral white matter chronic small vessel ischemic disease. 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 Signal abnormality within the V4 left vertebral artery suggesting high-grade stenosis. Paranasal sinus disease and mastoid effusions in the setting of life-support tubes. Electronically Signed   By: Kellie Simmering DO   On: 04/06/2021 17:30   DG Chest Port 1 View  Result Date: 04/04/2021 CLINICAL DATA:  Intubation EXAM: PORTABLE CHEST 1 VIEW COMPARISON:  Radiograph 04/23/2021 FINDINGS: *Endotracheal tube tip terminates in the mid trachea, 5 cm from the carina. *Transesophageal temperature probe in  the upper thoracic esophagus. *Transesophageal tube tip and side port distal to the GE junction. *Telemetry leads and external support devices overlie the chest. Significantly improved airspace opacity throughout the right lung. No new consolidative process is seen. No visible pneumothorax or effusion. Stable cardiomediastinal contours with a calcified aorta. No acute osseous or soft tissue abnormality. Degenerative changes are present in the imaged spine and shoulders. IMPRESSION: Significantly improved airspace opacities throughout the right hemithorax. Lines and tubes as above. Electronically Signed   By: Lovena Le M.D.   On: 04/04/2021 06:13   DG Chest Portable 1 View  Result Date: 04/27/2021 CLINICAL DATA:  OG tube placement. EXAM: PORTABLE CHEST 1 VIEW COMPARISON:  Earlier same day FINDINGS: Portable view of the lower chest and upper abdomen shows the tip of the NG tube in the proximal stomach. Although the proximal side port of the NG tube is obscured by overlying wires, is probably at the GE junction. Prominent gastric bubble noted with diffuse gaseous bowel distention associated. IMPRESSION: NG tube tip is in the stomach although the proximal side port is probably at the level of the GE junction. Tube could be advanced another 4-5 cm to ensure side port position in the stomach. Electronically Signed   By: Misty Stanley M.D.   On: 04/24/2021 08:06   DG Chest Portable 1 View  Result Date: 04/18/2021 CLINICAL DATA:  60 year old male status post CPR.  Intubated. EXAM: PORTABLE CHEST 1 VIEW COMPARISON:  Portable chest 11/24/2020 and earlier. FINDINGS: Portable AP supine view at 0625 hours. Endotracheal tube tip in good position just below the clavicles. Mediastinal contours are stable and within normal limits. Visualized tracheal air column is within normal limits. Pacer or resuscitation pads project over the lower chest. Allowing for portable technique the left lung appears clear but there is  widespread abnormal right lung opacity, most confluent about the hilum. No pneumothorax or pleural effusion evident on this supine view. No acute osseous abnormality identified. IMPRESSION: 1. ET tube in good position.  2. Widespread right lung opacity most confluent about the hilum. Aspiration or pneumonia favored over asymmetric edema as the left lung appears to be clear. Electronically Signed   By: Genevie Ann M.D.   On: 04/18/2021 06:43   DG Abd Portable 1V  Result Date: 04/05/2021 CLINICAL DATA:  Complication of feeding tube EXAM: PORTABLE ABDOMEN - 1 VIEW COMPARISON:  None. FINDINGS: NG tube in the lateral body of the stomach. No bowel obstruction. Mild gas in the colon may represent mild ileus. No urinary tract calculi. No acute skeletal abnormality. IMPRESSION: NG tube in the body the stomach. Mildly distended colon. Electronically Signed   By: Franchot Gallo M.D.   On: 04/05/2021 20:11   EEG adult  Result Date: 04/29/2021 Lora Havens, MD     04/22/2021  3:32 PM Patient Name: Torryn Rients MRN: [id number] Epilepsy Attending: Lora Havens Referring Physician/Provider: Dr Jacky Kindle Date: 04/05/2021 Duration: 34.46 mins Patient history: 60yo M s/p cardiac arrest. EEG to evaluate for seizure Level of alertness:  comatose AEDs during EEG study: Propofol Technical aspects: This EEG study was done with scalp electrodes positioned according to the 10-20 International system of electrode placement. Electrical activity was acquired at a sampling rate of '500Hz'$  and reviewed with a high frequency filter of '70Hz'$  and a low frequency filter of '1Hz'$ . EEG data were recorded continuously and digitally stored. Description: EEG showed continuous generalized background suppression. Patient was noted to have episodes of whole body brief jerks. Concomitant eeg showed generalized polyspike and wave consistent with myoclonic seizure, every 2-3 minutes, lasting 30-45 seconds. EEG was not reactive to tactile stimulation.  Hyperventilation and photic stimulation were not performed.   ABNORMALITY - Myoclonic seizure, generalized - Background suppression, generalized IMPRESSION: This study showed myoclonic seizures, every 2-3 minutes, lasting 30-45 seconds as well as profound diffuse encephalopathy. 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 Dr Tacy Learn was notified Lora Havens   Overnight EEG with video  Result Date: 04/04/2021 Lora Havens, MD     04/05/2021  9:14 AM Patient Name: Kadden Devone MRN: [id number] Epilepsy Attending: Lora Havens Referring Physician/Provider: Dr Jacky Kindle Duration: 04/04/2021 1529 to 04/04/2021 1529  Patient history: 60yo M s/p cardiac arrest. EEG to evaluate for seizure  Level of alertness:  comatose  AEDs during EEG study: Propofol, LEV, VPA  Technical aspects: This EEG study was done with scalp electrodes positioned according to the 10-20 International system of electrode placement. Electrical activity was acquired at a sampling rate of '500Hz'$  and reviewed with a high frequency filter of '70Hz'$  and a low frequency filter of '1Hz'$ . EEG data were recorded continuously and digitally stored. Description: Initially, patient was noted to have episodes of whole body brief jerks. Concomitant eeg showed generalized polyspike and wave consistent with myoclonic seizure, every 2-3 minutes, lasting 30-45 seconds. Gradually, the frequency of seizures increased in the started happening every 30 to 45 seconds. After around 2300 on 04/06/2021 as medications were adjusted, EEG showed generalized background attenuation. After 0230 on 04/04/2021, EEG started worsening again and showed burst suppression pattern with highly epileptiform bursts lasting 10 seconds alternating with EEG suppression lasting about 10 seconds.  Again after around 4 AM, EEG showed continuous generalized background attenuation. After 9 AM, EEG showed generalized periodic epileptiform discharges with  overriding fast activity at 0.5-'1Hz'$ . ABNORMALITY - Myoclonic seizure, generalized -Burst suppression with highly epileptiform discharges, generalized  IMPRESSION: This study  initially showed myoclonic seizures, every 2-3  minutes, lasting 30-45 seconds.  Seizures stopped after around 2300 on 04/20/2021.  However, EEG continued to show generalized epileptogenicity as well as profound diffuse encephalopathy. 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  Lora Havens   ECHOCARDIOGRAM COMPLETE  Result Date: 04/16/2021    ECHOCARDIOGRAM REPORT   Patient Name:   KHA ORBIN Date of Exam: 04/14/2021 Medical Rec #:  [id number]      Height:       75.0 in Accession #:    [id number]     Weight:       230.0 lb Date of Birth:  1961/11/04      BSA:          2.329 m Patient Age:    50 years       BP:           191/93 mmHg Patient Gender: M              HR:           96 bpm. Exam Location:  Inpatient Procedure: 2D Echo, Cardiac Doppler and Color Doppler Indications:    Cardiac arrest  History:        Patient has no prior history of Echocardiogram examinations.                 PVD, Arrythmias:Cardiac Arrest and NSVT,                 Signs/Symptoms:Shortness of Breath and ESRD; Risk                 Factors:Diabetes, Hypertension and Dyslipidemia.  Sonographer:    Dustin Flock Referring Phys: [phone] [place]  1. Left ventricular ejection fraction, by estimation, is 50 to 55%. The left ventricle has low normal function. The left ventricle has no regional wall motion abnormalities. There is severe left ventricular hypertrophy. Left ventricular diastolic parameters are consistent with Grade I diastolic dysfunction (impaired relaxation).  2. Right ventricular systolic function is normal. The right ventricular size is normal. There is mildly elevated pulmonary artery systolic pressure.  3. The mitral valve is normal in structure. No evidence of mitral valve  regurgitation. No evidence of mitral stenosis.  4. The aortic valve is calcified. Aortic valve regurgitation is not visualized. Mild to moderate aortic valve sclerosis/calcification is present, without any evidence of aortic stenosis.  5. The inferior vena cava is normal in size with greater than 50% respiratory variability, suggesting right atrial pressure of 3 mmHg. FINDINGS  Left Ventricle: Left ventricular ejection fraction, by estimation, is 50 to 55%. The left ventricle has low normal function. The left ventricle has no regional wall motion abnormalities. The left ventricular internal cavity size was normal in size. There is severe left ventricular hypertrophy. Left ventricular diastolic parameters are consistent with Grade I diastolic dysfunction (impaired relaxation). Right Ventricle: The right ventricular size is normal. No increase in right ventricular wall thickness. Right ventricular systolic function is normal. There is mildly elevated pulmonary artery systolic pressure. The tricuspid regurgitant velocity is 3.07  m/s, and with an assumed right atrial pressure of 3 mmHg, the estimated right ventricular systolic pressure is [id number]. Left Atrium: Left atrial size was normal in size. Right Atrium: Right atrial size was normal in size. Pericardium: There is no evidence of pericardial effusion. Mitral Valve: The mitral valve is normal in structure. No evidence of mitral valve regurgitation. No evidence of mitral valve stenosis. Tricuspid Valve: The  tricuspid valve is normal in structure. Tricuspid valve regurgitation is trivial. No evidence of tricuspid stenosis. Aortic Valve: The aortic valve is calcified. Aortic valve regurgitation is not visualized. Mild to moderate aortic valve sclerosis/calcification is present, without any evidence of aortic stenosis. Pulmonic Valve: The pulmonic valve was normal in structure. Pulmonic valve regurgitation is not visualized. No evidence of pulmonic stenosis. Aorta:  The aortic root is normal in size and structure. Venous: The inferior vena cava is normal in size with greater than 50% respiratory variability, suggesting right atrial pressure of 3 mmHg. IAS/Shunts: No atrial level shunt detected by color flow Doppler.  LEFT VENTRICLE PLAX 2D LVIDd:         3.80 cm  Diastology LVIDs:         2.70 cm  LV e' medial:    5.11 cm/s LV PW:         1.50 cm  LV E/e' medial:  17.7 LV IVS:        1.70 cm  LV e' lateral:   8.05 cm/s LVOT diam:     2.40 cm  LV E/e' lateral: 11.2 LV SV:         96 LV SV Index:   41 LVOT Area:     4.52 cm  RIGHT VENTRICLE RV Basal diam:  2.80 cm RV S prime:     14.50 cm/s TAPSE (M-mode): 3.1 cm LEFT ATRIUM             Index       RIGHT ATRIUM           Index LA diam:        4.00 cm 1.72 cm/m  RA Area:     16.00 cm LA Vol (A2C):   56.3 ml 24.18 ml/m RA Volume:   44.00 ml  18.89 ml/m LA Vol (A4C):   57.0 ml 24.48 ml/m LA Biplane Vol: 58.3 ml 25.04 ml/m  AORTIC VALVE LVOT Vmax:   128.00 cm/s LVOT Vmean:  78.000 cm/s LVOT VTI:    0.213 m  AORTA Ao Root diam: 3.30 cm MITRAL VALVE               TRICUSPID VALVE MV Area (PHT): 3.53 cm    TR Peak grad:   37.7 mmHg MV Decel Time: 215 msec    TR Vmax:        307.00 cm/s MV E velocity: 90.40 cm/s MV A velocity: 97.30 cm/s  SHUNTS MV E/A ratio:  0.93        Systemic VTI:  0.21 m                            Systemic Diam: 2.40 cm Candee Furbish MD Electronically signed by Candee Furbish MD Signature Date/Time: 04/09/2021/12:28:54 PM    Final    VAS [place] UPPER EXTREMITY VENOUS DUPLEX  Result Date: 04/06/2021 UPPER VENOUS STUDY  Patient Name:  DOREON OSADA  Date of Exam:   04/06/2021 Medical Rec #: [id number]       Accession #:    [id number] Date of Birth: [birth year]       Patient Gender: M Patient Age:   60Y Exam Location:  [facility] Procedure:      VAS [place] UPPER EXTREMITY VENOUS DUPLEX Referring Phys: [id number] Silvestre Moment [facility] --------------------------------------------------------------------------------  Indications:  Edema Comparison Study: no prior Performing Technologist: Abram Sander RVS  Examination Guidelines: A complete evaluation includes B-mode imaging, spectral Doppler,  color Doppler, and power Doppler as needed of all accessible portions of each vessel. Bilateral testing is considered an integral part of a complete examination. Limited examinations for reoccurring indications may be performed as noted.  Right Findings: +----------+------------+---------+-----------+----------+-----------------+ RIGHT     CompressiblePhasicitySpontaneousProperties     Summary      +----------+------------+---------+-----------+----------+-----------------+ IJV           Full       Yes       Yes                                +----------+------------+---------+-----------+----------+-----------------+ Subclavian    Full       Yes       Yes                                +----------+------------+---------+-----------+----------+-----------------+ Axillary      Full       Yes       Yes                                +----------+------------+---------+-----------+----------+-----------------+ Brachial      Full       Yes       Yes                                +----------+------------+---------+-----------+----------+-----------------+ Radial      Partial                                 Age Indeterminate +----------+------------+---------+-----------+----------+-----------------+ Ulnar         Full                                                    +----------+------------+---------+-----------+----------+-----------------+ Cephalic      None                                  Age Indeterminate +----------+------------+---------+-----------+----------+-----------------+ Basilic       Full                                                    +----------+------------+---------+-----------+----------+-----------------+  Summary:  Right: Findings consistent with age indeterminate deep  vein thrombosis involving the right radial veins. Findings consistent with age indeterminate superficial vein thrombosis involving the right cephalic vein.  *See table(s) above for measurements and observations.  Diagnosing physician: Jamelle Haring Electronically signed by Jamelle Haring on 04/06/2021 at 5:07:29 PM.    Final     Microbiology No results found for this or any previous visit (from the past 240 hour(s)).  Lab Basic Metabolic Panel: No results for input(s): NA, K, CL, CO2, GLUCOSE, BUN, CREATININE, CALCIUM, MG, PHOS in the last 168 hours. Liver Function Tests: No results for input(s): AST, ALT, ALKPHOS, BILITOT, PROT, ALBUMIN in the last 168 hours. No results for  input(s): LIPASE, AMYLASE in the last 168 hours. No results for input(s): AMMONIA in the last 168 hours. CBC: No results for input(s): WBC, NEUTROABS, HGB, HCT, MCV, PLT in the last 168 hours. Cardiac Enzymes: No results for input(s): CKTOTAL, CKMB, CKMBINDEX, TROPONINI in the last 168 hours. Sepsis Labs: No results for input(s): PROCALCITON, WBC, LATICACIDVEN in the last 168 hours.  Procedures/Operations  Mechanical ventilation.   Raelie Lohr 04/19/2021, 6:39 AM

## 2021-05-04 DEATH — deceased
